# Patient Record
Sex: Male | Born: 1940 | Race: Black or African American | Hispanic: No | Marital: Married | Smoking: Former smoker
Health system: Southern US, Community
[De-identification: ages and names within clinical notes are randomized; demographics above are authoritative.]

## PROBLEM LIST (undated history)

## (undated) DIAGNOSIS — I779 Disorder of arteries and arterioles, unspecified: Secondary | ICD-10-CM

## (undated) DIAGNOSIS — Z860100 Personal history of colon polyps, unspecified: Secondary | ICD-10-CM

## (undated) DIAGNOSIS — I739 Peripheral vascular disease, unspecified: Secondary | ICD-10-CM

## (undated) DIAGNOSIS — Z720 Tobacco use: Secondary | ICD-10-CM

## (undated) DIAGNOSIS — I272 Pulmonary hypertension, unspecified: Secondary | ICD-10-CM

## (undated) DIAGNOSIS — Z8709 Personal history of other diseases of the respiratory system: Secondary | ICD-10-CM

## (undated) DIAGNOSIS — G459 Transient cerebral ischemic attack, unspecified: Secondary | ICD-10-CM

## (undated) DIAGNOSIS — R7881 Bacteremia: Secondary | ICD-10-CM

## (undated) DIAGNOSIS — G35 Multiple sclerosis: Secondary | ICD-10-CM

## (undated) DIAGNOSIS — R972 Elevated prostate specific antigen [PSA]: Secondary | ICD-10-CM

## (undated) DIAGNOSIS — L98499 Non-pressure chronic ulcer of skin of other sites with unspecified severity: Secondary | ICD-10-CM

## (undated) DIAGNOSIS — G4700 Insomnia, unspecified: Secondary | ICD-10-CM

## (undated) DIAGNOSIS — M4150 Other secondary scoliosis, site unspecified: Secondary | ICD-10-CM

## (undated) DIAGNOSIS — I491 Atrial premature depolarization: Secondary | ICD-10-CM

## (undated) DIAGNOSIS — I441 Atrioventricular block, second degree: Secondary | ICD-10-CM

## (undated) DIAGNOSIS — G4730 Sleep apnea, unspecified: Secondary | ICD-10-CM

## (undated) DIAGNOSIS — E785 Hyperlipidemia, unspecified: Secondary | ICD-10-CM

## (undated) DIAGNOSIS — K5900 Constipation, unspecified: Secondary | ICD-10-CM

## (undated) DIAGNOSIS — I10 Essential (primary) hypertension: Secondary | ICD-10-CM

## (undated) DIAGNOSIS — R5383 Other fatigue: Secondary | ICD-10-CM

## (undated) DIAGNOSIS — I70209 Unspecified atherosclerosis of native arteries of extremities, unspecified extremity: Secondary | ICD-10-CM

## (undated) DIAGNOSIS — N529 Male erectile dysfunction, unspecified: Secondary | ICD-10-CM

## (undated) DIAGNOSIS — I255 Ischemic cardiomyopathy: Secondary | ICD-10-CM

## (undated) DIAGNOSIS — R911 Solitary pulmonary nodule: Secondary | ICD-10-CM

## (undated) DIAGNOSIS — R339 Retention of urine, unspecified: Secondary | ICD-10-CM

## (undated) DIAGNOSIS — N390 Urinary tract infection, site not specified: Secondary | ICD-10-CM

## (undated) DIAGNOSIS — I2510 Atherosclerotic heart disease of native coronary artery without angina pectoris: Secondary | ICD-10-CM

## (undated) DIAGNOSIS — R001 Bradycardia, unspecified: Secondary | ICD-10-CM

## (undated) DIAGNOSIS — F0390 Unspecified dementia without behavioral disturbance: Secondary | ICD-10-CM

## (undated) HISTORY — DX: Hyperlipidemia, unspecified: E78.5

## (undated) HISTORY — DX: Solitary pulmonary nodule: R91.1

## (undated) HISTORY — DX: Pulmonary hypertension, unspecified: I27.20

## (undated) HISTORY — DX: Multiple sclerosis: G35

## (undated) HISTORY — DX: Retention of urine, unspecified: R33.9

## (undated) HISTORY — DX: Tobacco use: Z72.0

## (undated) HISTORY — DX: Atrial premature depolarization: I49.1

## (undated) HISTORY — DX: Ischemic cardiomyopathy: I25.5

## (undated) HISTORY — DX: Disorder of arteries and arterioles, unspecified: I77.9

## (undated) HISTORY — DX: Atherosclerotic heart disease of native coronary artery without angina pectoris: I25.10

## (undated) HISTORY — DX: Elevated prostate specific antigen (PSA): R97.20

## (undated) HISTORY — DX: Peripheral vascular disease, unspecified: I73.9

---

## 1988-07-22 HISTORY — PX: HERNIA REPAIR: SHX51

## 1999-05-02 ENCOUNTER — Emergency Department (HOSPITAL_COMMUNITY): Admission: EM | Admit: 1999-05-02 | Discharge: 1999-05-02 | Payer: Self-pay | Admitting: Emergency Medicine

## 1999-05-02 ENCOUNTER — Encounter: Payer: Self-pay | Admitting: Emergency Medicine

## 1999-05-03 ENCOUNTER — Emergency Department (HOSPITAL_COMMUNITY): Admission: EM | Admit: 1999-05-03 | Discharge: 1999-05-03 | Payer: Self-pay | Admitting: Emergency Medicine

## 1999-05-04 ENCOUNTER — Emergency Department (HOSPITAL_COMMUNITY): Admission: EM | Admit: 1999-05-04 | Discharge: 1999-05-04 | Payer: Self-pay | Admitting: Emergency Medicine

## 2003-10-18 ENCOUNTER — Encounter (HOSPITAL_COMMUNITY): Admission: RE | Admit: 2003-10-18 | Discharge: 2003-10-18 | Payer: Self-pay | Admitting: Internal Medicine

## 2003-11-19 ENCOUNTER — Emergency Department (HOSPITAL_COMMUNITY): Admission: EM | Admit: 2003-11-19 | Discharge: 2003-11-19 | Payer: Self-pay | Admitting: Emergency Medicine

## 2003-11-28 ENCOUNTER — Encounter: Admission: RE | Admit: 2003-11-28 | Discharge: 2004-02-26 | Payer: Self-pay | Admitting: Neurology

## 2003-12-23 ENCOUNTER — Encounter: Payer: Self-pay | Admitting: Cardiology

## 2003-12-23 ENCOUNTER — Ambulatory Visit: Admission: RE | Admit: 2003-12-23 | Discharge: 2003-12-23 | Payer: Self-pay | Admitting: Neurology

## 2004-01-11 ENCOUNTER — Encounter (HOSPITAL_COMMUNITY): Admission: RE | Admit: 2004-01-11 | Discharge: 2004-04-10 | Payer: Self-pay | Admitting: Neurology

## 2004-02-21 ENCOUNTER — Emergency Department (HOSPITAL_COMMUNITY): Admission: EM | Admit: 2004-02-21 | Discharge: 2004-02-21 | Payer: Self-pay | Admitting: Emergency Medicine

## 2004-02-27 ENCOUNTER — Encounter: Admission: RE | Admit: 2004-02-27 | Discharge: 2004-03-28 | Payer: Self-pay | Admitting: Neurology

## 2004-03-03 ENCOUNTER — Emergency Department (HOSPITAL_COMMUNITY): Admission: EM | Admit: 2004-03-03 | Discharge: 2004-03-03 | Payer: Self-pay | Admitting: Emergency Medicine

## 2004-03-29 ENCOUNTER — Encounter: Admission: RE | Admit: 2004-03-29 | Discharge: 2004-04-24 | Payer: Self-pay | Admitting: Neurology

## 2004-04-13 ENCOUNTER — Encounter: Payer: Self-pay | Admitting: Cardiology

## 2004-04-13 ENCOUNTER — Ambulatory Visit (HOSPITAL_COMMUNITY): Admission: RE | Admit: 2004-04-13 | Discharge: 2004-04-13 | Payer: Self-pay | Admitting: Neurology

## 2004-06-11 ENCOUNTER — Ambulatory Visit: Payer: Self-pay | Admitting: Cardiology

## 2004-06-28 ENCOUNTER — Encounter: Admission: RE | Admit: 2004-06-28 | Discharge: 2004-07-21 | Payer: Self-pay | Admitting: Neurology

## 2004-07-03 ENCOUNTER — Ambulatory Visit: Payer: Self-pay | Admitting: Internal Medicine

## 2004-07-24 ENCOUNTER — Encounter: Admission: RE | Admit: 2004-07-24 | Discharge: 2004-10-22 | Payer: Self-pay | Admitting: Neurology

## 2004-08-10 ENCOUNTER — Ambulatory Visit: Payer: Self-pay | Admitting: Internal Medicine

## 2004-10-08 ENCOUNTER — Ambulatory Visit: Payer: Self-pay

## 2004-10-18 ENCOUNTER — Encounter (HOSPITAL_COMMUNITY): Admission: RE | Admit: 2004-10-18 | Discharge: 2005-01-16 | Payer: Self-pay | Admitting: Neurology

## 2004-11-07 ENCOUNTER — Emergency Department (HOSPITAL_COMMUNITY): Admission: EM | Admit: 2004-11-07 | Discharge: 2004-11-07 | Payer: Self-pay | Admitting: Emergency Medicine

## 2004-11-09 ENCOUNTER — Ambulatory Visit: Payer: Self-pay | Admitting: Internal Medicine

## 2004-12-04 ENCOUNTER — Encounter: Admission: RE | Admit: 2004-12-04 | Discharge: 2005-01-21 | Payer: Self-pay | Admitting: Neurology

## 2004-12-05 ENCOUNTER — Ambulatory Visit: Payer: Self-pay | Admitting: Internal Medicine

## 2005-01-03 ENCOUNTER — Emergency Department (HOSPITAL_COMMUNITY): Admission: EM | Admit: 2005-01-03 | Discharge: 2005-01-03 | Payer: Self-pay | Admitting: Emergency Medicine

## 2005-01-13 ENCOUNTER — Emergency Department (HOSPITAL_COMMUNITY): Admission: EM | Admit: 2005-01-13 | Discharge: 2005-01-13 | Payer: Self-pay | Admitting: Emergency Medicine

## 2005-01-17 ENCOUNTER — Emergency Department (HOSPITAL_COMMUNITY): Admission: EM | Admit: 2005-01-17 | Discharge: 2005-01-17 | Payer: Self-pay | Admitting: Emergency Medicine

## 2005-03-29 ENCOUNTER — Ambulatory Visit: Payer: Self-pay | Admitting: Internal Medicine

## 2005-07-22 DIAGNOSIS — I2510 Atherosclerotic heart disease of native coronary artery without angina pectoris: Secondary | ICD-10-CM

## 2005-07-22 HISTORY — DX: Atherosclerotic heart disease of native coronary artery without angina pectoris: I25.10

## 2005-12-10 ENCOUNTER — Ambulatory Visit: Payer: Self-pay | Admitting: Internal Medicine

## 2005-12-19 ENCOUNTER — Ambulatory Visit: Payer: Self-pay | Admitting: Gastroenterology

## 2005-12-26 ENCOUNTER — Encounter (INDEPENDENT_AMBULATORY_CARE_PROVIDER_SITE_OTHER): Payer: Self-pay | Admitting: *Deleted

## 2005-12-26 ENCOUNTER — Ambulatory Visit (HOSPITAL_COMMUNITY): Admission: RE | Admit: 2005-12-26 | Discharge: 2005-12-26 | Payer: Self-pay | Admitting: Gastroenterology

## 2005-12-26 ENCOUNTER — Encounter: Payer: Self-pay | Admitting: Internal Medicine

## 2005-12-26 ENCOUNTER — Encounter: Payer: Self-pay | Admitting: Gastroenterology

## 2005-12-31 ENCOUNTER — Ambulatory Visit: Payer: Self-pay | Admitting: Gastroenterology

## 2006-01-08 ENCOUNTER — Ambulatory Visit: Payer: Self-pay | Admitting: Internal Medicine

## 2006-04-30 ENCOUNTER — Ambulatory Visit: Payer: Self-pay | Admitting: Internal Medicine

## 2006-04-30 ENCOUNTER — Ambulatory Visit: Payer: Self-pay | Admitting: Cardiovascular Disease

## 2006-04-30 ENCOUNTER — Inpatient Hospital Stay (HOSPITAL_COMMUNITY): Admission: EM | Admit: 2006-04-30 | Discharge: 2006-05-05 | Payer: Self-pay | Admitting: Emergency Medicine

## 2006-05-01 HISTORY — PX: CORONARY ANGIOPLASTY WITH STENT PLACEMENT: SHX49

## 2006-05-02 ENCOUNTER — Encounter: Payer: Self-pay | Admitting: Cardiology

## 2006-05-12 ENCOUNTER — Ambulatory Visit: Payer: Self-pay | Admitting: Internal Medicine

## 2006-05-12 LAB — CONVERTED CEMR LAB
AST: 19 units/L (ref 0–37)
Alkaline Phosphatase: 57 units/L (ref 39–117)
BUN: 10 mg/dL (ref 6–23)
CO2: 27 meq/L (ref 19–32)
Calcium: 8.4 mg/dL (ref 8.4–10.5)
Chloride: 107 meq/L (ref 96–112)
GFR calc non Af Amer: 80 mL/min
Sodium: 140 meq/L (ref 135–145)

## 2006-05-29 ENCOUNTER — Ambulatory Visit: Payer: Self-pay | Admitting: Cardiology

## 2006-06-05 ENCOUNTER — Ambulatory Visit: Payer: Self-pay | Admitting: Cardiology

## 2006-06-14 ENCOUNTER — Emergency Department (HOSPITAL_COMMUNITY): Admission: EM | Admit: 2006-06-14 | Discharge: 2006-06-14 | Payer: Self-pay | Admitting: Emergency Medicine

## 2006-07-08 ENCOUNTER — Ambulatory Visit: Payer: Self-pay | Admitting: Cardiology

## 2006-07-22 DIAGNOSIS — Z8709 Personal history of other diseases of the respiratory system: Secondary | ICD-10-CM

## 2006-07-22 HISTORY — PX: THORACENTESIS: SHX235

## 2006-07-22 HISTORY — DX: Personal history of other diseases of the respiratory system: Z87.09

## 2006-07-29 ENCOUNTER — Ambulatory Visit: Payer: Self-pay | Admitting: Family Medicine

## 2006-07-30 ENCOUNTER — Emergency Department (HOSPITAL_COMMUNITY): Admission: EM | Admit: 2006-07-30 | Discharge: 2006-07-31 | Payer: Self-pay | Admitting: Emergency Medicine

## 2006-08-05 ENCOUNTER — Ambulatory Visit: Payer: Self-pay | Admitting: Internal Medicine

## 2006-08-05 ENCOUNTER — Inpatient Hospital Stay (HOSPITAL_COMMUNITY): Admission: EM | Admit: 2006-08-05 | Discharge: 2006-08-11 | Payer: Self-pay | Admitting: Emergency Medicine

## 2006-08-15 ENCOUNTER — Ambulatory Visit: Payer: Self-pay | Admitting: Internal Medicine

## 2006-08-17 ENCOUNTER — Inpatient Hospital Stay (HOSPITAL_COMMUNITY): Admission: EM | Admit: 2006-08-17 | Discharge: 2006-09-04 | Payer: Self-pay | Admitting: Emergency Medicine

## 2006-08-17 ENCOUNTER — Encounter: Payer: Self-pay | Admitting: Cardiology

## 2006-08-17 ENCOUNTER — Ambulatory Visit: Payer: Self-pay | Admitting: Pulmonary Disease

## 2006-08-17 ENCOUNTER — Ambulatory Visit: Payer: Self-pay | Admitting: Cardiology

## 2006-08-20 ENCOUNTER — Encounter (INDEPENDENT_AMBULATORY_CARE_PROVIDER_SITE_OTHER): Payer: Self-pay | Admitting: Specialist

## 2006-08-26 ENCOUNTER — Ambulatory Visit: Payer: Self-pay | Admitting: Internal Medicine

## 2006-09-04 ENCOUNTER — Encounter: Payer: Self-pay | Admitting: Internal Medicine

## 2006-09-09 ENCOUNTER — Ambulatory Visit: Payer: Self-pay | Admitting: Internal Medicine

## 2006-09-17 ENCOUNTER — Encounter: Admission: RE | Admit: 2006-09-17 | Discharge: 2006-09-17 | Payer: Self-pay | Admitting: Internal Medicine

## 2006-09-26 ENCOUNTER — Ambulatory Visit: Payer: Self-pay | Admitting: Internal Medicine

## 2006-10-21 ENCOUNTER — Ambulatory Visit: Payer: Self-pay | Admitting: Pulmonary Disease

## 2006-12-08 ENCOUNTER — Ambulatory Visit: Payer: Self-pay | Admitting: Internal Medicine

## 2006-12-18 DIAGNOSIS — G35 Multiple sclerosis: Secondary | ICD-10-CM

## 2006-12-18 DIAGNOSIS — E119 Type 2 diabetes mellitus without complications: Secondary | ICD-10-CM

## 2007-04-06 ENCOUNTER — Telehealth: Payer: Self-pay | Admitting: Internal Medicine

## 2007-04-10 ENCOUNTER — Telehealth: Payer: Self-pay | Admitting: Internal Medicine

## 2007-04-13 ENCOUNTER — Encounter: Payer: Self-pay | Admitting: Internal Medicine

## 2007-04-15 ENCOUNTER — Ambulatory Visit: Payer: Self-pay | Admitting: Internal Medicine

## 2007-04-15 DIAGNOSIS — N400 Enlarged prostate without lower urinary tract symptoms: Secondary | ICD-10-CM | POA: Insufficient documentation

## 2007-04-17 ENCOUNTER — Telehealth (INDEPENDENT_AMBULATORY_CARE_PROVIDER_SITE_OTHER): Payer: Self-pay | Admitting: *Deleted

## 2007-04-20 LAB — CONVERTED CEMR LAB
ALT: 19 units/L (ref 0–53)
AST: 19 units/L (ref 0–37)
BUN: 11 mg/dL (ref 6–23)
Basophils Absolute: 0.1 10*3/uL (ref 0.0–0.1)
Basophils Relative: 1.7 % — ABNORMAL HIGH (ref 0.0–1.0)
CO2: 28 meq/L (ref 19–32)
Calcium: 9.1 mg/dL (ref 8.4–10.5)
Chloride: 110 meq/L (ref 96–112)
Creatinine, Ser: 1 mg/dL (ref 0.4–1.5)
Eosinophils Absolute: 0.2 10*3/uL (ref 0.0–0.6)
Eosinophils Relative: 2 % (ref 0.0–5.0)
GFR calc Af Amer: 96 mL/min
GFR calc non Af Amer: 80 mL/min
Glucose, Bld: 65 mg/dL — ABNORMAL LOW (ref 70–99)
HCT: 40.9 % (ref 39.0–52.0)
Hemoglobin: 13.8 g/dL (ref 13.0–17.0)
Hgb A1c MFr Bld: 14.3 % — ABNORMAL HIGH (ref 4.6–6.0)
Lymphocytes Relative: 31.5 % (ref 12.0–46.0)
MCHC: 33.8 g/dL (ref 30.0–36.0)
MCV: 83 fL (ref 78.0–100.0)
Monocytes Absolute: 0.3 10*3/uL (ref 0.2–0.7)
Monocytes Relative: 3.6 % (ref 3.0–11.0)
Neutro Abs: 4.6 10*3/uL (ref 1.4–7.7)
Neutrophils Relative %: 61.2 % (ref 43.0–77.0)
Platelets: 190 10*3/uL (ref 150–400)
Potassium: 4 meq/L (ref 3.5–5.1)
RBC: 4.93 M/uL (ref 4.22–5.81)
RDW: 14.4 % (ref 11.5–14.6)
Sodium: 143 meq/L (ref 135–145)
WBC: 7.6 10*3/uL (ref 4.5–10.5)

## 2007-04-22 ENCOUNTER — Ambulatory Visit: Payer: Self-pay | Admitting: Internal Medicine

## 2007-04-22 ENCOUNTER — Telehealth: Payer: Self-pay | Admitting: Internal Medicine

## 2007-05-03 ENCOUNTER — Encounter: Payer: Self-pay | Admitting: Internal Medicine

## 2007-05-04 ENCOUNTER — Telehealth (INDEPENDENT_AMBULATORY_CARE_PROVIDER_SITE_OTHER): Payer: Self-pay | Admitting: *Deleted

## 2007-05-18 ENCOUNTER — Encounter: Payer: Self-pay | Admitting: Internal Medicine

## 2007-06-03 ENCOUNTER — Telehealth: Payer: Self-pay | Admitting: Internal Medicine

## 2007-06-29 ENCOUNTER — Telehealth: Payer: Self-pay | Admitting: Internal Medicine

## 2007-07-13 ENCOUNTER — Encounter: Payer: Self-pay | Admitting: Internal Medicine

## 2007-07-30 ENCOUNTER — Telehealth: Payer: Self-pay | Admitting: Internal Medicine

## 2007-07-30 ENCOUNTER — Telehealth (INDEPENDENT_AMBULATORY_CARE_PROVIDER_SITE_OTHER): Payer: Self-pay | Admitting: *Deleted

## 2007-07-31 ENCOUNTER — Ambulatory Visit: Payer: Self-pay | Admitting: Internal Medicine

## 2007-07-31 DIAGNOSIS — E785 Hyperlipidemia, unspecified: Secondary | ICD-10-CM | POA: Insufficient documentation

## 2007-08-04 ENCOUNTER — Telehealth: Payer: Self-pay | Admitting: Internal Medicine

## 2007-08-04 LAB — CONVERTED CEMR LAB: Creatinine,U: 168.3 mg/dL

## 2007-09-02 ENCOUNTER — Telehealth: Payer: Self-pay | Admitting: Internal Medicine

## 2007-09-30 ENCOUNTER — Encounter: Payer: Self-pay | Admitting: Internal Medicine

## 2007-10-01 ENCOUNTER — Encounter: Payer: Self-pay | Admitting: Internal Medicine

## 2007-10-02 ENCOUNTER — Telehealth (INDEPENDENT_AMBULATORY_CARE_PROVIDER_SITE_OTHER): Payer: Self-pay | Admitting: *Deleted

## 2007-10-08 ENCOUNTER — Telehealth: Payer: Self-pay | Admitting: Internal Medicine

## 2007-10-11 ENCOUNTER — Encounter: Payer: Self-pay | Admitting: Internal Medicine

## 2007-11-02 ENCOUNTER — Telehealth (INDEPENDENT_AMBULATORY_CARE_PROVIDER_SITE_OTHER): Payer: Self-pay | Admitting: *Deleted

## 2007-11-04 ENCOUNTER — Ambulatory Visit: Payer: Self-pay | Admitting: Internal Medicine

## 2008-03-17 ENCOUNTER — Ambulatory Visit: Payer: Self-pay | Admitting: Internal Medicine

## 2008-03-17 LAB — HM DIABETES FOOT EXAM

## 2008-03-21 ENCOUNTER — Telehealth (INDEPENDENT_AMBULATORY_CARE_PROVIDER_SITE_OTHER): Payer: Self-pay | Admitting: *Deleted

## 2008-03-21 LAB — CONVERTED CEMR LAB
ALT: 13 units/L (ref 0–53)
AST: 15 units/L (ref 0–37)
BUN: 16 mg/dL (ref 6–23)
Calcium: 9.4 mg/dL (ref 8.4–10.5)
Chloride: 103 meq/L (ref 96–112)
Creatinine, Ser: 0.8 mg/dL (ref 0.4–1.5)
Direct LDL: 123.5 mg/dL
HDL: 33.1 mg/dL — ABNORMAL LOW (ref >39.0)
Hemoglobin: 15.7 g/dL (ref 13.0–17.0)
Potassium: 4.1 meq/L (ref 3.5–5.1)
Triglycerides: 68 mg/dL (ref 0–149)
VLDL: 14 mg/dL (ref 0–40)

## 2008-03-22 ENCOUNTER — Encounter (INDEPENDENT_AMBULATORY_CARE_PROVIDER_SITE_OTHER): Payer: Self-pay | Admitting: *Deleted

## 2008-03-29 ENCOUNTER — Encounter: Payer: Self-pay | Admitting: Internal Medicine

## 2008-04-08 ENCOUNTER — Encounter: Payer: Self-pay | Admitting: Internal Medicine

## 2008-04-22 ENCOUNTER — Telehealth (INDEPENDENT_AMBULATORY_CARE_PROVIDER_SITE_OTHER): Payer: Self-pay | Admitting: *Deleted

## 2008-05-04 ENCOUNTER — Encounter: Payer: Self-pay | Admitting: Internal Medicine

## 2008-07-28 ENCOUNTER — Encounter: Payer: Self-pay | Admitting: Internal Medicine

## 2008-08-30 ENCOUNTER — Encounter: Admission: RE | Admit: 2008-08-30 | Discharge: 2008-11-28 | Payer: Self-pay | Admitting: Neurology

## 2008-09-28 ENCOUNTER — Encounter: Payer: Self-pay | Admitting: Internal Medicine

## 2008-12-21 ENCOUNTER — Encounter (INDEPENDENT_AMBULATORY_CARE_PROVIDER_SITE_OTHER): Payer: Self-pay | Admitting: *Deleted

## 2009-01-19 ENCOUNTER — Encounter: Payer: Self-pay | Admitting: Internal Medicine

## 2009-01-20 ENCOUNTER — Telehealth (INDEPENDENT_AMBULATORY_CARE_PROVIDER_SITE_OTHER): Payer: Self-pay | Admitting: *Deleted

## 2009-02-20 ENCOUNTER — Ambulatory Visit: Payer: Self-pay | Admitting: Internal Medicine

## 2009-02-20 LAB — CONVERTED CEMR LAB
Basophils Relative: 0.2 % (ref 0.0–3.0)
Hemoglobin: 15.2 g/dL (ref 13.0–17.0)
MCHC: 33 g/dL (ref 30.0–36.0)
Monocytes Relative: 6.5 % (ref 3.0–12.0)
Platelets: 176 10*3/uL (ref 150.0–400.0)
WBC: 7 10*3/uL (ref 4.5–10.5)

## 2009-02-27 ENCOUNTER — Encounter (INDEPENDENT_AMBULATORY_CARE_PROVIDER_SITE_OTHER): Payer: Self-pay | Admitting: *Deleted

## 2009-02-27 LAB — CONVERTED CEMR LAB
Chloride: 109 meq/L (ref 96–112)
Sodium: 143 meq/L (ref 135–145)

## 2009-03-17 ENCOUNTER — Encounter: Payer: Self-pay | Admitting: Internal Medicine

## 2009-04-10 ENCOUNTER — Encounter: Payer: Self-pay | Admitting: Internal Medicine

## 2009-04-27 ENCOUNTER — Telehealth (INDEPENDENT_AMBULATORY_CARE_PROVIDER_SITE_OTHER): Payer: Self-pay | Admitting: *Deleted

## 2009-05-02 ENCOUNTER — Ambulatory Visit: Payer: Self-pay | Admitting: Internal Medicine

## 2009-05-05 LAB — CONVERTED CEMR LAB
Cholesterol: 176 mg/dL (ref 0–200)
HDL: 30.7 mg/dL — ABNORMAL LOW (ref >39.00)
Triglycerides: 53 mg/dL (ref 0.0–149.0)
VLDL: 10.6 mg/dL (ref 0.0–40.0)

## 2009-07-05 ENCOUNTER — Encounter (INDEPENDENT_AMBULATORY_CARE_PROVIDER_SITE_OTHER): Payer: Self-pay | Admitting: *Deleted

## 2009-07-24 ENCOUNTER — Telehealth: Payer: Self-pay | Admitting: Internal Medicine

## 2009-07-24 ENCOUNTER — Emergency Department (HOSPITAL_COMMUNITY): Admission: EM | Admit: 2009-07-24 | Discharge: 2009-07-24 | Payer: Self-pay | Admitting: Emergency Medicine

## 2009-07-25 ENCOUNTER — Telehealth (INDEPENDENT_AMBULATORY_CARE_PROVIDER_SITE_OTHER): Payer: Self-pay | Admitting: *Deleted

## 2009-08-08 ENCOUNTER — Ambulatory Visit: Payer: Self-pay | Admitting: Internal Medicine

## 2009-08-10 LAB — CONVERTED CEMR LAB
ALT: 25 units/L (ref 0–53)
HDL: 34.1 mg/dL — ABNORMAL LOW (ref >39.00)
Total CHOL/HDL Ratio: 4
VLDL: 10.2 mg/dL (ref 0.0–40.0)

## 2009-08-25 ENCOUNTER — Encounter (INDEPENDENT_AMBULATORY_CARE_PROVIDER_SITE_OTHER): Payer: Self-pay | Admitting: *Deleted

## 2009-09-19 ENCOUNTER — Ambulatory Visit: Payer: Self-pay | Admitting: Internal Medicine

## 2009-09-21 LAB — CONVERTED CEMR LAB
BUN: 10 mg/dL (ref 6–23)
Chloride: 102 meq/L (ref 96–112)
Creatinine, Ser: 0.8 mg/dL (ref 0.4–1.5)
GFR calc non Af Amer: 123.8 mL/min (ref >60)
TSH: 1.23 microintl units/mL (ref 0.35–5.50)

## 2009-10-13 ENCOUNTER — Ambulatory Visit: Payer: Self-pay | Admitting: Gastroenterology

## 2009-11-06 ENCOUNTER — Telehealth (INDEPENDENT_AMBULATORY_CARE_PROVIDER_SITE_OTHER): Payer: Self-pay | Admitting: *Deleted

## 2009-11-07 ENCOUNTER — Observation Stay (HOSPITAL_COMMUNITY): Admission: AD | Admit: 2009-11-07 | Discharge: 2009-11-08 | Payer: Self-pay | Admitting: Gastroenterology

## 2009-11-07 ENCOUNTER — Ambulatory Visit: Payer: Self-pay | Admitting: Gastroenterology

## 2009-11-08 ENCOUNTER — Encounter: Payer: Self-pay | Admitting: Gastroenterology

## 2009-11-14 ENCOUNTER — Encounter: Payer: Self-pay | Admitting: Gastroenterology

## 2010-01-08 ENCOUNTER — Encounter: Payer: Self-pay | Admitting: Internal Medicine

## 2010-04-04 ENCOUNTER — Ambulatory Visit: Payer: Self-pay | Admitting: Internal Medicine

## 2010-05-15 ENCOUNTER — Emergency Department (HOSPITAL_COMMUNITY): Admission: EM | Admit: 2010-05-15 | Discharge: 2010-05-15 | Payer: Self-pay | Admitting: Emergency Medicine

## 2010-05-15 ENCOUNTER — Encounter: Payer: Self-pay | Admitting: Internal Medicine

## 2010-05-15 ENCOUNTER — Telehealth (INDEPENDENT_AMBULATORY_CARE_PROVIDER_SITE_OTHER): Payer: Self-pay | Admitting: *Deleted

## 2010-05-18 ENCOUNTER — Ambulatory Visit: Payer: Self-pay | Admitting: Internal Medicine

## 2010-05-18 DIAGNOSIS — R55 Syncope and collapse: Secondary | ICD-10-CM

## 2010-06-06 ENCOUNTER — Encounter: Payer: Self-pay | Admitting: Cardiology

## 2010-06-07 ENCOUNTER — Ambulatory Visit: Payer: Self-pay | Admitting: Cardiology

## 2010-06-07 DIAGNOSIS — F17200 Nicotine dependence, unspecified, uncomplicated: Secondary | ICD-10-CM

## 2010-06-29 ENCOUNTER — Ambulatory Visit: Payer: Self-pay

## 2010-07-26 ENCOUNTER — Encounter: Payer: Self-pay | Admitting: Cardiology

## 2010-08-12 ENCOUNTER — Encounter: Payer: Self-pay | Admitting: Internal Medicine

## 2010-08-20 ENCOUNTER — Emergency Department (HOSPITAL_COMMUNITY)
Admission: EM | Admit: 2010-08-20 | Discharge: 2010-08-20 | Payer: Self-pay | Source: Home / Self Care | Admitting: Family Medicine

## 2010-08-21 ENCOUNTER — Telehealth: Payer: Self-pay | Admitting: Internal Medicine

## 2010-08-21 NOTE — Procedures (Signed)
Summary: Colonoscopy/[facility]  Colonoscopy/[facility]   Imported By: Lanelle Bal 09/25/2009 13:38:23  _____________________________________________________________________  External Attachment:    Type:   Image     Comment:   External Document

## 2010-08-21 NOTE — Progress Notes (Signed)
Summary: 10-27-FYI--pt going to the hospital  Phone Note Call from Patient Call back at Home Phone [phone]   Summary of Call: Patient spouse called to notify the office that she called EMS for the patient due to the patient being lethargic, "became paralyzed" and moaning. She also states that EMS said the patient BP is too low. Patient will be taken to [facility]. Initial call taken by: Lucious Groves CMA,  May 15, 2010 2:43 PM  Follow-up for Phone Call        please check on him in 2 days Follow-up by: [facility] E. [name] MD,  May 15, 2010 4:07 PM  Additional Follow-up for Phone Call Additional follow up Details #1::        left message to call office...............Marland KitchenFelecia Deloach CMA  May 17, 2010 8:37 AM

## 2010-08-21 NOTE — Progress Notes (Signed)
  Phone Note Call from Patient   Caller: Spouse Summary of Call: pt wife called does pt still need followup appt for cholesterol was at ED 04/23/10.  informed pt still needs labwork and lab scheduled  Initial call taken by: Kandice Hams,  July 25, 2009 2:29 PM

## 2010-08-21 NOTE — Assessment & Plan Note (Signed)
Summary: hosp followup--trigger m/s attack from uti///sph   Vital Signs:  Patient profile:   70 year old male Height:      71 inches Pulse rate:   74 / minute Pulse rhythm:   regular BP sitting:   104 / 76  (left arm) Cuff size:   regular  Vitals Entered By: Army Fossa CMA (May 18, 2010 1:09 PM) CC: Hospital f/u- not fasting Comments BP was low Had a UTI- on ciprofloxacin now. 5 days left  [facility]    History of Present Illness: the patient presented to the hospital ER 05-15-10 with a near-syncope, chart is reviewed:  cardiac enzymes x1 negative Urinalysis showed few bacteria  CBC show white count of 5.8, immobile and 13.7, platelets 141 it's my creatinine 1.2, potassium 3.6  he was diagnosed with near syncope in the setting of a UTI ( eventually the urine culture came back negative), his blood pressure was found initially to be low in the 90s. His CBG was 199  at the time, he had mental status changes, he couldn't speak for about an hour, was unresponsive for a while but apparently the patient was able to hear his wife talking to him. There were no associated symptoms like chest pain, bladder or bowel incontinence or seizures. The urine showed few WBCs, he was prescribed Cipro. Eventually the urine culture came back negative. Since that time he is feeling fine and no further problems.  ROS No nausea, vomiting, blood in the stools No fever or dysuria Urine seems normal in color No further mental status changes No chest pain or shortness of breath      Current Medications (verified): 1)  Amantadine Hcl 100 Mg Tabs (Amantadine Hcl) .... Take 1 Tablet By Mouth Three Times A Day 2)  Lantus 100 Unit/ml  Soln (Insulin Glargine) .... 40 Units At Bedtime 3)  Aspirin Ec 325 Mg Tbec (Aspirin) .Marland Kitchen.. 1 A Day 4)  Simvastatin 40 Mg Tabs (Simvastatin) .Marland Kitchen.. 1 By Mouth At Bedtime 5)  Humalog Pen 100 Unit/ml Soln (Insulin Lispro (Human)) .... 8 Units Per Day After Each  Meal 6)  Finasteride 5 Mg Tabs (Finasteride) .... Take One By Mouth Once Daily  Allergies (verified): No Known Drug Allergies  Past History:  Past Medical History: Reviewed history from 09/19/2009 and no changes required. 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 S. increased PSA Hyperlipidemia Lung nodule-- resolved per 11-2006 CT chest   Past Surgical History: Reviewed history from 07/31/2007 and no changes required. hernia repair  (R)  Social History: Reviewed history from 09/19/2009 and no changes required. Married children x 2  Current Smoker, has cut down "1 pack a month" Alcohol use-no    Physical Exam  General:  alert and well-developed.   Lungs:  normal respiratory effort, no intercostal retractions, no accessory muscle use, and normal breath sounds.   Heart:  normal rate, regular rhythm, and no murmur.   Extremities:  no edema   Impression & Recommendations:  Problem # 1:  SYNCOPE (ICD-780.2)  syncope as described above--->  the patient had mental status changes, low BP,  bacteriuria with a  negative urine culture. Etiology unclear; apparently there was not  hypoglycemia. He did have hypotension  but no evidence of sepsis  His EKG from the Er is abnormal w/a wide QRS no old EKGs, unable to get  him in the table for a repeated EKG He does have a h/o CAD Plan: finish antibiotics cards evaluation encouraged ASAdaily    Orders: Cardiology Referral (Cardiology)  Complete Medication List: 1)  Amantadine Hcl 100 Mg Tabs (Amantadine hcl) .... Take 1 tablet by mouth three times a day 2)  Lantus 100 Unit/ml Soln (Insulin glargine) .... 40 units at bedtime 3)  Aspirin Ec 325 Mg Tbec (Aspirin) .Marland Kitchen.. 1 a day 4)  Simvastatin 40 Mg Tabs (Simvastatin) .Marland Kitchen.. 1 by mouth at bedtime 5)  Humalog Pen 100 Unit/ml Soln (Insulin  lispro (human)) .... 8 units per day after each meal 6)  Finasteride 5 Mg Tabs (Finasteride) .... Take one by mouth once daily  Patient Instructions: 1)  finish your antibiotics 2)  Call me any time if your blood pressures are less than 110/70, or if you have fever, difficulty urinating, nausea, vomiting or stomach pain   Orders Added: 1)  Cardiology Referral [Cardiology] 2)  Est. Patient Level IV [78295]

## 2010-08-21 NOTE — Letter (Signed)
Summary: DM--improving, [facility]   Imported By: Lanelle Bal 01/24/2010 14:24:22  _____________________________________________________________________  External Attachment:    Type:   Image     Comment:   External Document

## 2010-08-21 NOTE — Assessment & Plan Note (Signed)
  Review of gastrointestinal problems: 1. Adenomatous colon polyps removed 2007 by Dr. Christella Hartigan, recall colonoscopy at 3 year interval    History of Present Illness Visit Type: Initial Visit Primary GI MD: Rob Bunting MD Primary Provider: Willow Ora, MD Chief Complaint: Discuss Colonoscopy History of Present Illness:     very pleasant 70 year old man who has been in wheelchair for 10-15 years for MS complications.  I performed a colonoscopy on him while he was hospitalized in 2007, see those results above.  He is on no blood thinners.  No major medical issues since last colonoscopy.    he has one bowel movement every day or 2 at home, this is usually quite difficult for he and his wife to deal with, often has incontinence around this time do to his MS, not being able to get to the bathroom in time since he is wheelchair-bound.           Current Medications (verified): 1)  Amantadine Hcl 100 Mg Tabs (Amantadine Hcl) .... Take 1 Tablet By Mouth Three Times A Day 2)  Lantus 100 Unit/ml  Soln (Insulin Glargine) .... 40 Units At Bedtime 3)  Aspirin Ec 325 Mg Tbec (Aspirin) .Marland Kitchen.. 1 A Day 4)  Simvastatin 40 Mg Tabs (Simvastatin) .Marland Kitchen.. 1 By Mouth At Bedtime 5)  Humalog Pen 100 Unit/ml Soln (Insulin Lispro (Human)) .... 8 Units Per Day After Each Meal 6)  Finasteride 5 Mg Tabs (Finasteride) .... Take One By Mouth Once Daily  Allergies (verified): No Known Drug Allergies  Vital Signs:  Patient profile:   70 year old male Height:      71 inches Pulse rate:   72 / minute Pulse rhythm:   regular BP sitting:   100 / 60  (left arm) Cuff size:   regular  Vitals Entered By: Harlow Mares CMA Duncan Dull) (October 13, 2009 11:16 AM)  Physical Exam  Additional Exam:  Constitutional: generally well appearing man, sitting in a wheelchair Psychiatric: alert and oriented times 3 Abdomen: soft, non-tender, non-distended, normal bowel sounds    Impression & Recommendations:  Problem # 1:  history  of precancerous colon polyps he is due for surveillance colonoscopy around now and we will arrange that to be done at his soonest convenience. He is wheelchair-bound and even has difficulty with his usual everyday or 2 BMs, causing quite a mess at home, being difficult to deal with. I think it would be extremely difficult for him to prep at home and so we will admit him to observation status the day prior to his colonoscopy prep him while in the hospital and then perform his colonoscopy the next morning. If all goes well then he will be able to be discharged shortly after his procedure.  Weight was not documented today as patient unable to stand; BMI could not be calculated.  Patient Instructions: 1)  You will be scheduled to have a colonoscopy at [facility] during Dr. Christella Hartigan next hospital week. 2)  You will be admitted the day prior (observation status) and then undergo colonoscopy the next morning with Dr. Christella Hartigan. 3)  Make sure to drink only clear liquids starting the morning before your colonoscopy. 4)  The medication list was reviewed and reconciled.  All changed / newly prescribed medications were explained.  A complete medication list was provided to the patient / caregiver.

## 2010-08-21 NOTE — Assessment & Plan Note (Signed)
Summary: yearly check/cbs   Vital Signs:  Patient profile:   70 year old male Pulse rate:   76 / minute BP sitting:   120 / 80  Vitals Entered By: Shary Decamp (September 19, 2009 1:20 PM) CC: yearly   History of Present Illness: CAD-- does not see cards routinely   DM-- still sees Dr Talmage Nap regularly  M. S.-- symptoms stable x 3 years , to see neuro soon  Hyperlipidemia-- good medication compliance    yearly checkup, chart reviewed  Current Medications (verified): 1)  Amantadine Hcl 100 Mg Tabs (Amantadine Hcl) .... Take 1 Tablet By Mouth Three Times A Day 2)  Lantus 100 Unit/ml  Soln (Insulin Glargine) .... 28 Units Daily 3)  Aspirin Ec 325 Mg Tbec (Aspirin) .Marland Kitchen.. 1 A Day 4)  Novolog 100 Unit/ml Soln (Insulin Aspart) .... 3 Units Three Times A Day 5)  Simvastatin 40 Mg Tabs (Simvastatin) .Marland Kitchen.. 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 S. increased PSA Hyperlipidemia Lung nodule-- resolved per 11-2006 CT chest   Past Surgical History: Reviewed history from 07/31/2007 and no changes required. hernia repair  (R)  Family History: Hypertension-- + M: MI 83y/0 colon ca: no Prostate ca: no  Social History: Married children x 2  Current Smoker, has cut down "1 pack a month" Alcohol use-no    Review of Systems General:  Denies fever. CV:  Denies chest pain or discomfort and swelling of feet. Resp:  Denies cough and shortness of breath. GI:  Denies bloody stools, diarrhea, nausea, and vomiting. GU:  Denies urinary frequency and urinary hesitancy. Psych:  Denies anxiety and depression.  Physical Exam  General:  alert.  wheelchair bound  Lungs:  normal respiratory effort, no intercostal retractions, no accessory  muscle use, and normal breath sounds.   Heart:  normal rate, regular rhythm, and no murmur.   Abdomen:  soft, non-tender, normal bowel sounds, no distention, and no masses.   Extremities:  no pretibial edema bilaterally  Psych:  Oriented X3, normally interactive, good eye contact, not anxious appearing, and not depressed appearing.     Impression & Recommendations:  Problem # 1:  HYPERLIPIDEMIA (ICD-272.4) doing well  His updated medication list for this problem includes:    Simvastatin 40 Mg Tabs (Simvastatin) .Marland Kitchen... 1 by mouth at bedtime  Orders: TLB-BMP (Basic Metabolic Panel-BMET) (80048-METABOL) Venipuncture (60454) TLB-TSH (Thyroid Stimulating Hormone) (84443-TSH)  Labs Reviewed: SGOT: 26 (08/08/2009)   SGPT: 25 (08/08/2009)   HDL:34.10 (08/08/2009), 30.70 (05/02/2009)  LDL:96 (08/08/2009), 135 (09/81/1914)  Chol:140 (08/08/2009), 176 (05/02/2009)  Trig:51.0 (08/08/2009), 53.0 (05/02/2009)  Problem # 2:  HEALTH SCREENING (ICD-V70.0) Td-- declined  pneumonia shot-- per patient 2007   last PSA 8-10 = 3.22  last colonoscopy 6-07, recall letter  was issues 12/2008 ----> plans to see GI soon (per wife)  Problem # 3:  DM (ICD-250.00) per Dr Talmage Nap  The following medications were removed from the medication list:    Novolog 100 Unit/ml Soln (Insulin aspart) .Marland KitchenMarland KitchenMarland KitchenMarland Kitchen 3 units three times a day His updated medication list for this problem includes:    Lantus 100 Unit/ml Soln (Insulin glargine) .Marland Kitchen... 28 units daily    Aspirin Ec 325 Mg Tbec (Aspirin) .Marland Kitchen... 1 a day  Problem #  4:  MULTIPLE SCLEROSIS (ICD-340) stable   Problem # 5:  CORONARY ARTERY DISEASE (ICD-414.00) asx continue w/ ASA, chol and DM control   His updated medication list for this problem includes:    Aspirin Ec 325 Mg Tbec (Aspirin) .Marland Kitchen... 1 a day  Complete Medication List: 1)  Amantadine Hcl 100 Mg Tabs (Amantadine hcl) .... Take 1 tablet by mouth three times a day 2)  Lantus 100 Unit/ml Soln (Insulin glargine)  .... 28 units daily 3)  Aspirin Ec 325 Mg Tbec (Aspirin) .Marland Kitchen.. 1 a day 4)  Simvastatin 40 Mg Tabs (Simvastatin) .Marland Kitchen.. 1 by mouth at bedtime  Patient Instructions: 1)  Please schedule a follow-up appointment in 6 months .    Risk Factors: Tobacco use:  current Alcohol use:  no    Pneumovax Immunization History:    Pneumovax # 1:  historical, done at hospital (07/22/2006)

## 2010-08-21 NOTE — Assessment & Plan Note (Signed)
Summary: rto 6 months/cbs/rescd cbs   Vital Signs:  Patient profile:   70 year old male Height:      71 inches Pulse rate:   94 / minute Pulse rhythm:   regular BP sitting:   110 / 80  (left arm) Cuff size:   regular  Vitals Entered By: Army Fossa CMA (April 04, 2010 2:37 PM) CC: 6 month f/u- not fasting Comments declines flu shot pharm- [facility]   History of Present Illness: ROV here w/ his wife , reports no concerns    Current Medications (verified): 1)  Amantadine Hcl 100 Mg Tabs (Amantadine Hcl) .... Take 1 Tablet By Mouth Three Times A Day 2)  Lantus 100 Unit/ml  Soln (Insulin Glargine) .... 40 Units At Bedtime 3)  Aspirin Ec 325 Mg Tbec (Aspirin) .Marland Kitchen.. 1 A Day 4)  Simvastatin 40 Mg Tabs (Simvastatin) .Marland Kitchen.. 1 By Mouth At Bedtime 5)  Humalog Pen 100 Unit/ml Soln (Insulin Lispro (Human)) .... 8 Units Per Day After Each Meal 6)  Finasteride 5 Mg Tabs (Finasteride) .... Take One By Mouth Once Daily  Allergies (verified): No Known Drug Allergies  Past History:  Past Medical History: Reviewed history from 09/19/2009 and no changes required. 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 S. increased PSA Hyperlipidemia Lung nodule-- resolved per 11-2006 CT chest   Past Surgical History: Reviewed history from 07/31/2007 and no changes required. hernia repair  (R)  Review of Systems       CAD-- denies CP-SOB DM II-- sees Dr Talmage Nap , last  note from her states DM is improving  increased PSA-- no hematuria, no diff. urinating  Hyperlipidemia-- good medication compliance     Physical Exam  General:  alert.  wheelchair bound  Lungs:  normal respiratory effort, no intercostal retractions, no accessory muscle use, and normal breath sounds.   Heart:  normal rate, regular rhythm, and no murmur.   Extremities:  no  pretibial edema bilaterally  Psych:  Oriented X3, memory intact for recent and remote, normally interactive, good eye contact, not anxious appearing, and not depressed appearing.  good spirits   Impression & Recommendations:  Problem # 1:  HYPERLIPIDEMIA (ICD-272.4)  well controlled per last [facility] His updated medication list for this problem includes:    Simvastatin 40 Mg Tabs (Simvastatin) .Marland Kitchen... 1 by mouth at bedtime  Labs Reviewed: SGOT: 26 (08/08/2009)   SGPT: 25 (08/08/2009)   HDL:34.10 (08/08/2009), 30.70 (05/02/2009)  LDL:96 (08/08/2009), 135 (16/04/9603)  Chol:140 (08/08/2009), 176 (05/02/2009)  Trig:51.0 (08/08/2009), 53.0 (05/02/2009)  Orders: Venipuncture (54098) TLB-ALT (SGPT) (84460-ALT) TLB-AST (SGOT) (84450-SGOT) Specimen Handling (11914)  Problem # 2:  DM (ICD-250.00) per endocrinology His updated medication list for this problem includes:    Lantus 100 Unit/ml Soln (Insulin glargine) .Marland KitchenMarland KitchenMarland KitchenMarland Kitchen 40 units at bedtime    Aspirin Ec 325 Mg Tbec (Aspirin) .Marland Kitchen... 1 a day    Humalog Pen 100 Unit/ml Soln (Insulin lispro (human)) .Marland Kitchen... 8 units per day after each meal  Problem # 3:  PROSTATE SPECIFIC ANTIGEN, ELEVATED (ICD-790.93)  recheck  Orders: TLB-PSA (Prostate Specific Antigen) (84153-PSA) Specimen Handling (78295)  Problem # 4:  explained the benefits of flu shot states he had a reaction to it in the past  Complete Medication List: 1)  Amantadine Hcl 100 Mg Tabs (Amantadine hcl) .... Take 1 tablet by mouth three times  a day 2)  Lantus 100 Unit/ml Soln (Insulin glargine) .... 40 units at bedtime 3)  Aspirin Ec 325 Mg Tbec (Aspirin) .Marland Kitchen.. 1 a day 4)  Simvastatin 40 Mg Tabs (Simvastatin) .Marland Kitchen.. 1 by mouth at bedtime 5)  Humalog Pen 100 Unit/ml Soln (Insulin lispro (human)) .... 8 units per day after each meal 6)  Finasteride 5 Mg Tabs (Finasteride) .... Take one by mouth once daily  Patient Instructions: 1)  Please schedule a follow-up appointment in 6 months ,  fasting physical.     Influenza Immunization History:    Influenza # 1:  declined, had a reaction, told not to take again (04/04/2010)

## 2010-08-21 NOTE — Letter (Signed)
Summary: New Patient letter  [facility]  [address] [place], [place] [address]   Phone: [phone]  Fax: [phone]       08/25/2009 MRN: [id number]  Jaime [name] [address] [place], [place]  [address]  Dear [name],  Welcome to the Gastroenterology Division at [facility].    You are scheduled to see Dr.  Christella Hartigan on 10/13/2009 at 11:00am on the 3rd floor at [facility], [address]  We ask that you try to arrive at our office 15 minutes prior to your appointment time to allow for check-in.  We would like you to complete the enclosed self-administered evaluation form prior to your visit and bring it with you on the day of your appointment.  We will review it with you.  Also, please bring a complete list of all your medications or, if you prefer, bring the medication bottles and we will list them.  Please bring your insurance card so that we may make a copy of it.  If your insurance requires a referral to see a specialist, please bring your referral form from your primary care physician.  Co-payments are due at the time of your visit and may be paid by cash, check or credit card.     Your office visit will consist of a consult with your physician (includes a physical exam), any laboratory testing he/she may order, scheduling of any necessary diagnostic testing (e.g. x-ray, ultrasound, CT-scan), and scheduling of a procedure (e.g. Endoscopy, Colonoscopy) if required.  Please allow enough time on your schedule to allow for any/all of these possibilities.    If you cannot keep your appointment, please call [phone] to cancel or reschedule prior to your appointment date.  This allows [place] the opportunity to schedule an appointment for another patient in need of care.  If you do not cancel or reschedule by 5 p.m. the business day prior to your appointment date, you will be charged a $50.00 late cancellation/no-show fee.    Thank you for  choosing [facility] for your medical needs.  We appreciate the opportunity to care for you.  Please visit [place] at our website  to learn more about our practice.                     Sincerely,                                                             The Gastroenterology Division

## 2010-08-21 NOTE — Progress Notes (Signed)
Summary: hosp admit  ---- Converted from flag ---- ---- 11/06/2009 10:40 AM, Chales Abrahams CMA (AAMA) wrote: Bed placement called and they will contact the pt tomorrow with a bed.  The pt was called and is aware.  ---- 11/06/2009 10:17 AM, Rachael Fee MD wrote: ok, please let [facility] know about this admission for tomorrow, colonoscopy on Wed  ---- 10/13/2009 11:36 AM, Chales Abrahams CMA (AAMA) wrote: call [facility] and have pt admitted the day before colon on Wed the 20th. ------------------------------

## 2010-08-21 NOTE — Miscellaneous (Signed)
  Clinical Lists Changes  Observations: Added new observation of PAST MED HX: HYPERLIPIDEMIA CARDIOMYOPATHY, ISCHEMIC EF 45%..... echo.. January, 2008... severe hypokinesis inferior and posterior walls at base and mid ventricle Pulmonary hypertension    moderate.... echo.... January, 2008 CAD   non-STEMI.. 2007.Marland Kitchen occluded circumflex... Taxus stent placed.... residual 80% LAD.Marland Kitchen 50% RCA CHF  UNSPECIFIED SYSTOLIC HEART FAILURE CELLULITIS SYNCOPE  HEALTH SCREENING  PROSTATE SPECIFIC ANTIGEN, ELEVATED  FAMILY HISTORY OF CAD MALE 1ST DEGREE RELATIVE <50  DM  MULTIPLE SCLEROSIS increased PSA Lung nodule-- resolved per 11-2006 CT chest  Enterococcus urinary tract infection per urine culture January 19,2008. Syncope     near syncope... ER visit... May 15, 2010 (06/06/2010 14:37) Added new observation of PRIMARY MD: Willow Ora, MD (06/06/2010 14:37)       Past History:  Past Medical History: HYPERLIPIDEMIA CARDIOMYOPATHY, ISCHEMIC EF 45%..... echo.. January, 2008... severe hypokinesis inferior and posterior walls at base and mid ventricle Pulmonary hypertension    moderate.... echo.... January, 2008 CAD   non-STEMI.. 2007.Marland Kitchen occluded circumflex... Taxus stent placed.... residual 80% LAD.Marland Kitchen 50% RCA CHF  UNSPECIFIED SYSTOLIC HEART FAILURE CELLULITIS SYNCOPE  HEALTH SCREENING  PROSTATE SPECIFIC ANTIGEN, ELEVATED  FAMILY HISTORY OF CAD MALE 1ST DEGREE RELATIVE <50  DM  MULTIPLE SCLEROSIS increased PSA Lung nodule-- resolved per 11-2006 CT chest  Enterococcus urinary tract infection per urine culture January 19,2008. Syncope     near syncope... ER visit... May 15, 2010

## 2010-08-21 NOTE — Procedures (Signed)
Summary: Colon   Colonoscopy  Procedure date:  12/26/2005  Findings:      Location:  [facility].   Patient Name: Jaime [name], Jaime [name] MRN: [id number] Procedure Procedures: Colonoscopy CPT: 54098.    with polypectomy. CPT: A3573898.  Personnel: Endoscopist: Rachael Fee, MD.  Exam Location: Exam performed in Endoscopy Suite. Outpatient  Patient Consent: Procedure, Alternatives, Risks and Benefits discussed, consent obtained, from patient. Consent was obtained by the RN.  Indications  Surveillance of: Adenomatous Polyp(s).  Comments: colonoscopy with polypectomy in [place] 5-6 years ago. History  Current Medications: Patient is not currently taking Coumadin.  Pre-Exam Physical: Performed Dec 26, 2005. Cardio-pulmonary exam, Abdominal exam WNL. Neurological exam abnormal. Mental status exam WNL. Abnormal PE findings include: weak from MS.  Comments: Pt. history reviewed/updated, physical exam performed prior to initiation of sedation? yes Exam Exam: Extent of exam reached: Cecum, extent intended: Cecum.  The cecum was identified by appendiceal orifice and IC valve. Patient position: on left side. Colon retroflexion performed. Images taken. ASA Classification: II. Tolerance: good.  Monitoring: Pulse and BP monitoring, Oximetry used. Supplemental O2 given.  Colon Prep Prep results: good.  Fluoroscopy: Fluoroscopy was not used.  Sedation Meds: Patient assessed and found to be appropriate for moderate (conscious) sedation. Fentanyl 50 mcg. given IV. Versed 5 mg. given IV.  Findings POLYP: Transverse Colon, Maximum size: 1 mm. sessile polyp. Procedure:  snare without cautery, removed, not retrieved, ICD9: Colon Polyps: 211.3.  POLYP: Transverse Colon, Maximum size: 2 mm. sessile polyp. Procedure:  snare without cautery, removed, retrieved, Polyp sent to pathology. ICD9: Colon Polyps: 211.3.  POLYP: Ascending Colon, Maximum size: 2 mm. sessile polyp. Procedure:  snare without cautery, removed, retrieved, sent to pathology. ICD9: Colon Polyps: 211.3.  POLYP: Ascending Colon, Maximum size: 2 mm. sessile polyp. Procedure:  snare without cautery, removed, retrieved, sent to pathology. ICD9: Colon Polyps: 211.3.  POLYP: Descending Colon, Maximum size: 3 mm. sessile polyp. Procedure:  snare without cautery, removed, retrieved, sent to pathology. ICD9: Colon Polyps: 211.3.  POLYP: Descending Colon, Maximum size: 2 mm. sessile polyp. Procedure:  snare without cautery, removed, not retrieved, ICD9: Colon Polyps: 211.3.  POLYP: Sigmoid Colon, Maximum size: 1 mm. sessile polyp. Procedure:  snare without cautery, removed, not retrieved, ICD9: Colon Polyps: 211.3.  POLYP: Sigmoid Colon, Maximum size: 2 mm. sessile polyp. Procedure:  snare without cautery, removed, retrieved, sent to pathology. ICD9: Colon Polyps: 211.3.  - NORMAL EXAM: Cecum to Rectum. Comments: otherwise normal examination.  - HEMORRHOIDS: External. Size: Small. ICD9: Hemorrhoids, External: 455.3.   Assessment Abnormal examination, see findings above.  Diagnoses: 211.3: Colon Polyps.  455.3: Hemorrhoids, External.   Comments: Several small colon polyps, no cancers. Events  Unplanned Interventions: No intervention was required.  Unplanned Events: There were no complications. Plans Comments: Await pathology, but he will likely need surveillance colonoscopy in 3 years given multiple small polyps removed on this exam and his personal history of polyps. Disposition: After procedure patient sent to recovery.  Scheduling/Referral: Await pathology to schedule patient.  This report was created from the original endoscopy report, which was reviewed and signed by the above listed endoscopist.

## 2010-08-21 NOTE — Letter (Signed)
Summary: Results Letter  [facility]  [address] [place], [place] [address]   Phone: [phone]  Fax: [phone]        November 14, 2009 MRN: [id number]    Jaime [name] [address] [place], [place]  [address]    Dear Mr. Nowaczyk,   Good news.  The polyp that was removed during your recent procedure was NOT pre-cancerous.  Given your personal history of precancerous polyps you will need a repeat colonoscopy in 5 years.  We will put your information in our reminder system and contact you around that time.        Sincerely,  Rachael Fee MD  This letter has been electronically signed by your physician.  Appended Document: Results Letter letter mailed

## 2010-08-21 NOTE — Assessment & Plan Note (Signed)
Summary: ec6/ syncope/ pt has medicare.  Medications Added TERAZOSIN HCL 5 MG CAPS (TERAZOSIN HCL) once daily      Allergies Added: NKDA  Visit Type:  reestablish care Primary Provider:  Willow Ora, MD  CC:  CAD.  History of Present Illness: The patient is seen for follow up of coronary disease.  He is also seen to followup in emergency room visit for presyncope.  I saw the patient last in the office in December, 2007.  He does have coronary disease area he had a non-STEMI in 2007.  It is totally occluded circumflex was open with a Taxus stent placement.  He has residual 80% LAD and 50% right coronary disease.  He has not had any chest pain.  I have reviewed my past records from 2007.  I reviewed the emergency room note from October 31 of 2011.  I have reviewed the other office records. I have updated the past medical history extensively.  Current Medications (verified): 1)  Amantadine Hcl 100 Mg Tabs (Amantadine Hcl) .... Take 1 Tablet By Mouth Three Times A Day 2)  Lantus 100 Unit/ml  Soln (Insulin Glargine) .... 40 Units At Bedtime 3)  Aspirin Ec 325 Mg Tbec (Aspirin) .Marland Kitchen.. 1 A Day 4)  Simvastatin 40 Mg Tabs (Simvastatin) .Marland Kitchen.. 1 By Mouth At Bedtime 5)  Humalog Pen 100 Unit/ml Soln (Insulin Lispro (Human)) .... 8 Units Per Day After Each Meal 6)  Finasteride 5 Mg Tabs (Finasteride) .... Take One By Mouth Once Daily 7)  Terazosin Hcl 5 Mg Caps (Terazosin Hcl) .... Once Daily  Allergies (verified): No Known Drug Allergies  Past History:  Past Medical History: HYPERLIPIDEMIA CARDIOMYOPATHY, ISCHEMIC EF 45%..... echo.. January, 2008... severe hypokinesis inferior and posterior walls at base and mid ventricle Pulmonary hypertension    moderate.... echo.... January, 2008 CAD   non-STEMI.. 2007.Marland Kitchen occluded circumflex... Taxus stent placed.... residual 80% LAD.Marland Kitchen 50% RCA CHF  UNSPECIFIED SYSTOLIC HEART FAILURE CELLULITIS.. SYNCOPE  HEALTH SCREENING  PROSTATE SPECIFIC ANTIGEN,  ELEVATED  FAMILY HISTORY OF CAD MALE 1ST DEGREE RELATIVE <50  DM  MULTIPLE SCLEROSIS increased PSA Lung nodule-- resolved per 11-2006 CT chest  Enterococcus urinary tract infection per urine culture January 19,2008. Syncope     near syncope... ER visit... May 15, 2010 Tobacco abuse  Review of Systems       Patient denies fever, chills, headache, sweats, rash, change in vision, change in hearing, chest pain, cough, nausea vomiting, urinary symptoms.  All of the systems are reviewed and are negative.  Vital Signs:  Patient profile:   70 year old male Height:      71 inches Pulse rate:   85 / minute BP sitting:   126 / 78  (left arm) Cuff size:   regular  Vitals Entered By: Hardin Negus, RMA (June 07, 2010 11:33 AM)  Physical Exam  General:  The patient is stable for him today.  With his multiple sclerosis he is wheelchair-bound. Head:  head is atraumatic. Eyes:  no xanthelasma. Neck:  no jugular venous extension. Chest Wall:  no chest wall tenderness. Lungs:  lungs are clear.  Respiratory effort is nonlabored. Heart:  cardiac exam reveals S1-S2.  There no clicks or significant murmurs. Abdomen:  abdomen is soft. Msk:  patient has areas of weakness related to his multiple sclerosis. Extremities:  no peripheral edema. Skin:  no skin rashes. Psych:  patient is oriented to person time and place.  Affect is normal.   Impression & Recommendations:  Problem #  1:  HYPERLIPIDEMIA (ICD-272.4)  His updated medication list for this problem includes:    Simvastatin 40 Mg Tabs (Simvastatin) .Marland Kitchen... 1 by mouth at bedtime Lipids are treated.  No change in therapy.  Problem # 2:  CORONARY ARTERY DISEASE (ICD-414.00)  His updated medication list for this problem includes:    Aspirin Ec 325 Mg Tbec (Aspirin) .Marland Kitchen... 1 a day Coronary disease is stable.  His stent was placed in 2007.  He is not having any significant symptoms.  I've chosen not to proceed with any exercise testing at  this time.  Problem # 3:  SYNCOPE (ICD-780.2)  His updated medication list for this problem includes:    Aspirin Ec 325 Mg Tbec (Aspirin) .Marland Kitchen... 1 a day Etiology of the event that took him to the emergency room is not clear.  This resolved rapidly.  There is no evidence of myocardial ischemia or of significant arrhythmias.  He says that rarely in the past he has had a spell that has been attributed to his multiple sclerosis.  He is stable at this time.  Because he has known vascular disease I will schedule a carotid Doppler.  I do not have any records that he has ever had a carotid Doppler in the past.  We will be in touch with him with the information. There is a possibility that the patient may have had a urinary tract infection around the time of this event.  He says that his urine was cloudy and improved with antibiotics.  Culture was negative I do not know when it was drawn relative to the antibiotics.  Orders: Carotid Duplex (Carotid Duplex)  Problem # 4:  MULTIPLE SCLEROSIS (ICD-340) Multiple sclerosis certainly affects him but he manages well.  His wife is here with him and she is very supportive.  I'll see him back for cardiology follow up in one year.  Problem # 5:  TOBACCO ABUSE (ICD-305.1) The patient is smoking.  He is actively cutting down at this time and is working towards stopping.  I counseled him and encouraged him to stop completely.  Patient Instructions: 1)  Your physician has requested that you have a carotid duplex. This test is an ultrasound of the carotid arteries in your neck. It looks at blood flow through these arteries that supply the brain with blood. Allow one hour for this exam. There are no restrictions or special instructions. 2)  Your physician wants you to follow-up in:  1 year.  You will receive a reminder letter in the mail two months in advance. If you don't receive a letter, please call our office to schedule the follow-up appointment.

## 2010-08-21 NOTE — Procedures (Signed)
Summary: Colonoscopy  Patient: [name] Note: All result statuses are Final unless otherwise noted.  Tests: (1) Colonoscopy (COL)   COL Colonoscopy           DONE     [facility]     [address] [place], [place]  [address]           COLONOSCOPY PROCEDURE REPORT           PATIENT:  Jaime [name], Jaime [name]  MR#:  [id number]     BIRTHDATE:  January 02, 1941, 68 yrs. old  GENDER:  male     ENDOSCOPIST:  Rachael Fee, MD           PROCEDURE DATE:  11/08/2009     PROCEDURE:  Colonoscopy with snare polypectomy     ASA CLASS:  Class II     INDICATIONS:  history of adenomatous polyps (2007)     MEDICATIONS:   Fentanyl 50 mcg IV, Versed 6 mg IV     DESCRIPTION OF PROCEDURE:   After the risks benefits and     alternatives of the procedure were thoroughly explained, informed     consent was obtained.  Digital rectal exam was performed and     revealed no rectal masses.   The  endoscope was introduced through     the anus and advanced to the cecum, which was identified by both     the appendix and ileocecal valve, without limitations.  The     quality of the prep was good, using MoviPrep.  The instrument was     then slowly withdrawn as the colon was fully examined.     <<PROCEDUREIMAGES>>     FINDINGS:  A diminutive polyp was found in the distal transverse     colon. This was 3mm across, removed with cold snare and sent to     pathology (jar 1) (see image4).  Mild diverticulosis was found.     There were a few small scattered diverticulum throughout colon.     This was otherwise a normal examination of the colon (see image3,     image2, and image5).   Retroflexed views in the rectum revealed no     abnormalities.    The scope was then withdrawn from the patient     and the procedure completed.           COMPLICATIONS:  None           ENDOSCOPIC IMPRESSION:     1) Diminutive polyp in the distal transverse colon, removed and     sent to pathology     2) Mild  diverticulosis     3) Otherwise normal examination           RECOMMENDATIONS:     1) Given your personal history of precancerous polyps, you will     need a colonoscopy again in 5 years even if the polyp removed     today is NOT precancerous.     2) You will receive a letter within 1-2 weeks with the results     of your biopsy as well as final recommendations. Please call my     office if you have not received a letter after 3 weeks.           ______________________________     Rachael Fee, MD           n.     eSIGNEDMelton Alar.  Jacobs at 11/08/2009 09:29 AM           Elmer Picker, [phone]  Note: An exclamation mark (!) indicates a result that was not dispersed into the flowsheet. Document Creation Date: 11/08/2009 9:29 AM _______________________________________________________________________  (1) Order result status: Final Collection or observation date-time: 11/08/2009 09:17 Requested date-time:  Receipt date-time:  Reported date-time:  Referring Physician:   Ordering Physician: Rob Bunting [phone]) Specimen Source:  Source: Launa Grill Order Number: [id number] Lab site:   Appended Document: Colonoscopy    Clinical Lists Changes  Observations: Added new observation of COLONNXTDUE: 10/2014 (11/14/2009 8:14)      Appended Document: Colonoscopy recall in IDX and EMR

## 2010-08-21 NOTE — Progress Notes (Signed)
Summary: FYI ED//Ramey Schiff  Phone Note Call from Patient Call back at Home Phone [phone]   Caller: Spouse Summary of Call: pt wife called, pt is having intestinal virus x 4 days, now has watery bloody stool.  Recommend ED wife agreed, she will take pt to [facility] Initial call taken by: Kandice Hams,  July 24, 2009 10:52 AM

## 2010-08-23 NOTE — Letter (Signed)
Summary: Generic Letter  [facility] Main [facility]  [address]   [place], [place] [address]   Phone: [phone]  Fax: [phone]        July 26, 2010 MRN: [id number]    Jaime [name] [address] [place], [place]  [address]    Dear [name],  Our records indicate that you missed your appointment for your carotid ultrasound.  It is Dr Myrtis Ser recommendation that you have this test done.  Please call our office at [phone] to reschedule.     Sincerely,  Meredith Staggers, RN Willa Rough, MD  This letter has been electronically signed by your physician.

## 2010-08-24 ENCOUNTER — Ambulatory Visit (INDEPENDENT_AMBULATORY_CARE_PROVIDER_SITE_OTHER): Payer: Medicare Other | Admitting: Internal Medicine

## 2010-08-24 ENCOUNTER — Encounter: Payer: Self-pay | Admitting: Cardiovascular Disease

## 2010-08-24 ENCOUNTER — Encounter: Payer: Self-pay | Admitting: Internal Medicine

## 2010-08-24 DIAGNOSIS — M25519 Pain in unspecified shoulder: Secondary | ICD-10-CM | POA: Insufficient documentation

## 2010-08-27 ENCOUNTER — Encounter (INDEPENDENT_AMBULATORY_CARE_PROVIDER_SITE_OTHER): Payer: Medicare Other

## 2010-08-27 ENCOUNTER — Encounter: Payer: Self-pay | Admitting: Cardiology

## 2010-08-27 DIAGNOSIS — R55 Syncope and collapse: Secondary | ICD-10-CM

## 2010-08-27 DIAGNOSIS — I6529 Occlusion and stenosis of unspecified carotid artery: Secondary | ICD-10-CM

## 2010-08-29 NOTE — Miscellaneous (Signed)
Summary: Orders Update  Clinical Lists Changes  Orders: Added new Test order of Carotid Duplex (Carotid Duplex) - Signed 

## 2010-08-29 NOTE — Progress Notes (Signed)
Summary: Catheter Supplies  Phone Note Outgoing Call   Call placed by: Army Fossa CMA,  August 21, 2010 8:54 AM Summary of Call: Received a fax from [facility]- Dr. Drue Novel needs to know what Catherter Supplies the pt needs. (Form is on my desk if anyone needs)  I Left message for pt to call back. Army Fossa CMA  August 21, 2010 8:55 AM   Follow-up for Phone Call        spouse Santina Evans) called to report that patient needs a external condom catherter and leg bag; he also needs to order a new meter, test strips and lancets.Jerolyn Shin  August 22, 2010 9:46 AM  Additional Follow-up for Phone Call Additional follow up Details #1::        order signed [facility] E. Malaki Koury MD  August 22, 2010 11:16 AM

## 2010-09-06 NOTE — Letter (Signed)
Summary: Diabetic Medical Supplies  Diabetic Medical Supplies   Imported By: Kassie Mends 08/30/2010 10:37:25  _____________________________________________________________________  External Attachment:    Type:   Image     Comment:   External Document

## 2010-09-06 NOTE — Assessment & Plan Note (Signed)
Summary: discuss tendontitis    Vital Signs:  Patient profile:   70 year old male Pulse rate:   76 / minute Pulse rhythm:   regular BP sitting:   142 / 82  (left arm) Cuff size:   regular  Vitals Entered By: Army Fossa CMA (August 24, 2010 10:19 AM) CC: Pt here to f/u on tendontitis  Comments BP has been elevated  fasting  [facility]    History of Present Illness: 2 weeks history of left shoulder pain, points to the trapezoid area. A week ago, went to urgent care,  got a  IM injection of Toradol which worked for 6 days. Pain resurfaced yesterday and is exacerbated by picking up things with the left arm  ROS Denies neck pain Denies tingling in his left upper extremity Muscular weakness at baseline as far as his diabetes, he sees endocrinology and his next appointment is in April he saw cardiology 11-11 for syncope, they recommended a carotid ultrasound ...was canceled by the patient  Current Medications (verified): 1)  Humalog Pen 100 Unit/ml Soln (Insulin Lispro (Human)) .... 30 Before Breakfast, 10 Before Lunch, 30 Be Fore Dinner  Allergies (verified): No Known Drug Allergies  Past History:  Past Medical History: Reviewed history from 06/07/2010 and no changes required. HYPERLIPIDEMIA CARDIOMYOPATHY, ISCHEMIC EF 45%..... echo.. January, 2008... severe hypokinesis inferior and posterior walls at base and mid ventricle Pulmonary hypertension    moderate.... echo.... January, 2008 CAD   non-STEMI.. 2007.Marland Kitchen occluded circumflex... Taxus stent placed.... residual 80% LAD.Marland Kitchen 50% RCA CHF  UNSPECIFIED SYSTOLIC HEART FAILURE CELLULITIS.. SYNCOPE  HEALTH SCREENING  PROSTATE SPECIFIC ANTIGEN, ELEVATED  FAMILY HISTORY OF CAD MALE 1ST DEGREE RELATIVE <50  DM  MULTIPLE SCLEROSIS increased PSA Lung nodule-- resolved per 11-2006 CT chest  Enterococcus urinary tract infection per urine culture January 19,2008. Syncope     near syncope... ER visit... May 15, 2010 Tobacco abuse  Past Surgical History: Reviewed history from 07/31/2007 and no changes required. hernia repair  (R)  Social History: Reviewed history from 09/19/2009 and no changes required. Married children x 2  Current Smoker, has cut down "1 pack a month" Alcohol use-no    Physical Exam  General:  alert and well-developed.  no apparent distress Neck:  note tender of the cervical spine to palpation Extremities:  passive range of motion of the left shoulder without problems, not tender at the a.c. joint, no tender of the deltoid muscle. Strength of the left arm seems to be at baseline   Impression & Recommendations:  Problem # 1:  SHOULDER PAIN (ICD-719.41) shoulder pain, likely deltoid strain history of good tolerance to NSAIDs See instructions The following medications were removed from the medication list:    Aspirin Ec 325 Mg Tbec (Aspirin) .Marland Kitchen... 1 a day  Complete Medication List: 1)  Humalog Pen 100 Unit/ml Soln (Insulin lispro (human)) .... 30 before breakfast, 10 before lunch, 30 be fore dinner  Patient Instructions: 1)  take Aleve 220 over the counter one   tablets twice a day as needed. Always take it with food. 2)  Stop  Aleve if you feel nausea or stomach pain 3)  warm compress twice a day 4)  Call if the shoulder is not improving in 3 weeks   Orders Added: 1)  Est. Patient Level III [82956]

## 2010-09-19 ENCOUNTER — Encounter: Payer: Self-pay | Admitting: Internal Medicine

## 2010-10-02 NOTE — Medication Information (Signed)
Summary: Order for Diabetic Testing Supplies   Order for Diabetic Testing Supplies   Imported By: Maryln Gottron 09/26/2010 10:26:11  _____________________________________________________________________  External Attachment:    Type:   Image     Comment:   External Document

## 2010-10-03 LAB — POCT CARDIAC MARKERS
CKMB, poc: <1 ng/mL — ABNORMAL LOW (ref 1.0–8.0)
Myoglobin, poc: 94.6 ng/mL (ref 12–200)

## 2010-10-03 LAB — URINALYSIS, ROUTINE W REFLEX MICROSCOPIC
Glucose, UA: NEGATIVE mg/dL
Hgb urine dipstick: NEGATIVE
Protein, ur: NEGATIVE mg/dL
Urobilinogen, UA: 1 mg/dL (ref 0.0–1.0)

## 2010-10-03 LAB — DIFFERENTIAL
Basophils Absolute: 0 10*3/uL (ref 0.0–0.1)
Basophils Relative: 0 % (ref 0–1)
Eosinophils Absolute: 0.1 10*3/uL (ref 0.0–0.7)
Monocytes Absolute: 0.5 10*3/uL (ref 0.1–1.0)
Monocytes Relative: 9 % (ref 3–12)

## 2010-10-03 LAB — POCT I-STAT, CHEM 8
BUN: 14 mg/dL (ref 6–23)
Creatinine, Ser: 1.2 mg/dL (ref 0.4–1.5)
Hemoglobin: 15 g/dL (ref 13.0–17.0)
Potassium: 3.6 mEq/L (ref 3.5–5.1)
Sodium: 141 mEq/L (ref 135–145)

## 2010-10-03 LAB — URINE MICROSCOPIC-ADD ON

## 2010-10-03 LAB — URINE CULTURE: Culture: NO GROWTH

## 2010-10-03 LAB — CBC
HCT: 40.6 % (ref 39.0–52.0)
Hemoglobin: 13.7 g/dL (ref 13.0–17.0)
MCH: 26.5 pg (ref 26.0–34.0)
MCHC: 33.7 g/dL (ref 30.0–36.0)
RDW: 16.1 % — ABNORMAL HIGH (ref 11.5–15.5)

## 2010-10-07 LAB — CBC
HCT: 49.3 % (ref 39.0–52.0)
Hemoglobin: 16.5 g/dL (ref 13.0–17.0)
MCV: 82.7 fL (ref 78.0–100.0)
RBC: 5.96 MIL/uL — ABNORMAL HIGH (ref 4.22–5.81)
WBC: 5.9 10*3/uL (ref 4.0–10.5)

## 2010-10-07 LAB — URINALYSIS, ROUTINE W REFLEX MICROSCOPIC
Bilirubin Urine: NEGATIVE
Glucose, UA: >1000 mg/dL — AB
Ketones, ur: 15 mg/dL — AB
Nitrite: NEGATIVE
Specific Gravity, Urine: 1.036 — ABNORMAL HIGH (ref 1.005–1.030)
pH: 5.5 (ref 5.0–8.0)

## 2010-10-07 LAB — DIFFERENTIAL
Basophils Relative: 0 % (ref 0–1)
Eosinophils Relative: 2 % (ref 0–5)
Lymphs Abs: 1.8 10*3/uL (ref 0.7–4.0)
Monocytes Absolute: 0.4 10*3/uL (ref 0.1–1.0)
Monocytes Relative: 7 % (ref 3–12)
Neutro Abs: 3.6 10*3/uL (ref 1.7–7.7)

## 2010-10-07 LAB — BASIC METABOLIC PANEL
BUN: 15 mg/dL (ref 6–23)
Chloride: 105 mEq/L (ref 96–112)
GFR calc non Af Amer: >60 mL/min (ref >60)
Potassium: 4 mEq/L (ref 3.5–5.1)
Sodium: 140 mEq/L (ref 135–145)

## 2010-10-07 LAB — URINE MICROSCOPIC-ADD ON

## 2010-10-09 ENCOUNTER — Emergency Department (HOSPITAL_COMMUNITY)
Admission: EM | Admit: 2010-10-09 | Discharge: 2010-10-09 | Disposition: A | Discharge status: Home / Self Care | Payer: Medicare Other | Attending: Emergency Medicine | Admitting: Emergency Medicine

## 2010-10-09 ENCOUNTER — Emergency Department (HOSPITAL_COMMUNITY): Payer: Medicare Other

## 2010-10-09 ENCOUNTER — Inpatient Hospital Stay (INDEPENDENT_AMBULATORY_CARE_PROVIDER_SITE_OTHER)
Admission: RE | Admit: 2010-10-09 | Discharge: 2010-10-09 | Disposition: A | Discharge status: Home / Self Care | Payer: Medicare Other | Source: Ambulatory Visit | Attending: Family Medicine | Admitting: Family Medicine

## 2010-10-09 DIAGNOSIS — K5900 Constipation, unspecified: Secondary | ICD-10-CM | POA: Insufficient documentation

## 2010-10-09 DIAGNOSIS — I252 Old myocardial infarction: Secondary | ICD-10-CM | POA: Insufficient documentation

## 2010-10-09 DIAGNOSIS — R42 Dizziness and giddiness: Secondary | ICD-10-CM | POA: Insufficient documentation

## 2010-10-09 DIAGNOSIS — G35 Multiple sclerosis: Secondary | ICD-10-CM | POA: Insufficient documentation

## 2010-10-09 DIAGNOSIS — R109 Unspecified abdominal pain: Secondary | ICD-10-CM | POA: Insufficient documentation

## 2010-10-09 DIAGNOSIS — N200 Calculus of kidney: Secondary | ICD-10-CM | POA: Insufficient documentation

## 2010-10-09 DIAGNOSIS — Z794 Long term (current) use of insulin: Secondary | ICD-10-CM | POA: Insufficient documentation

## 2010-10-09 DIAGNOSIS — R7989 Other specified abnormal findings of blood chemistry: Secondary | ICD-10-CM

## 2010-10-09 DIAGNOSIS — E119 Type 2 diabetes mellitus without complications: Secondary | ICD-10-CM | POA: Insufficient documentation

## 2010-10-09 DIAGNOSIS — Z79899 Other long term (current) drug therapy: Secondary | ICD-10-CM | POA: Insufficient documentation

## 2010-10-09 DIAGNOSIS — R339 Retention of urine, unspecified: Secondary | ICD-10-CM | POA: Insufficient documentation

## 2010-10-09 LAB — COMPREHENSIVE METABOLIC PANEL
Alkaline Phosphatase: 63 U/L (ref 39–117)
BUN: 13 mg/dL (ref 6–23)
Chloride: 107 mEq/L (ref 96–112)
Creatinine, Ser: 1.41 mg/dL (ref 0.4–1.5)
GFR calc non Af Amer: 50 mL/min — ABNORMAL LOW (ref >60)
Glucose, Bld: 394 mg/dL — ABNORMAL HIGH (ref 70–99)
Potassium: 4.3 mEq/L (ref 3.5–5.1)
Total Bilirubin: 0.9 mg/dL (ref 0.3–1.2)

## 2010-10-09 LAB — URINALYSIS, ROUTINE W REFLEX MICROSCOPIC
Bilirubin Urine: NEGATIVE
Glucose, UA: 500 mg/dL — AB
Protein, ur: 30 mg/dL — AB

## 2010-10-09 LAB — URINE MICROSCOPIC-ADD ON

## 2010-10-09 LAB — CBC
HCT: 47.6 % (ref 39.0–52.0)
Hemoglobin: 13.8 g/dL (ref 13.0–17.0)
Platelets: 124 10*3/uL — ABNORMAL LOW (ref 150–400)
RBC: 5.23 MIL/uL (ref 4.22–5.81)
RBC: 6.04 MIL/uL — ABNORMAL HIGH (ref 4.22–5.81)
RDW: 16.5 % — ABNORMAL HIGH (ref 11.5–15.5)
RDW: 17.4 % — ABNORMAL HIGH (ref 11.5–15.5)
WBC: 12.7 10*3/uL — ABNORMAL HIGH (ref 4.0–10.5)
WBC: 5.3 10*3/uL (ref 4.0–10.5)

## 2010-10-09 LAB — DIFFERENTIAL
Basophils Absolute: 0 10*3/uL (ref 0.0–0.1)
Eosinophils Absolute: 0 10*3/uL (ref 0.0–0.7)
Eosinophils Relative: 0 % (ref 0–5)
Lymphocytes Relative: 6 % — ABNORMAL LOW (ref 12–46)
Lymphs Abs: 0.8 10*3/uL (ref 0.7–4.0)
Neutrophils Relative %: 87 % — ABNORMAL HIGH (ref 43–77)

## 2010-10-09 LAB — GLUCOSE, CAPILLARY
Glucose-Capillary: 240 mg/dL — ABNORMAL HIGH (ref 70–99)
Glucose-Capillary: 140 mg/dL — ABNORMAL HIGH (ref 70–99)

## 2010-10-09 LAB — BASIC METABOLIC PANEL
Calcium: 8.7 mg/dL (ref 8.4–10.5)
GFR calc Af Amer: >60 mL/min (ref >60)
GFR calc non Af Amer: >60 mL/min (ref >60)
Sodium: 139 mEq/L (ref 135–145)

## 2010-10-09 LAB — POCT CARDIAC MARKERS: Myoglobin, poc: >500 ng/mL (ref 12–200)

## 2010-10-09 LAB — PROTIME-INR: INR: 1.05 (ref 0.00–1.49)

## 2010-10-09 LAB — APTT: aPTT: 29 seconds (ref 24–37)

## 2010-10-11 LAB — URINE CULTURE

## 2010-10-20 ENCOUNTER — Encounter: Payer: Self-pay | Admitting: Internal Medicine

## 2010-10-23 ENCOUNTER — Ambulatory Visit (INDEPENDENT_AMBULATORY_CARE_PROVIDER_SITE_OTHER): Payer: Medicare Other | Admitting: Internal Medicine

## 2010-10-23 ENCOUNTER — Encounter: Payer: Self-pay | Admitting: Internal Medicine

## 2010-10-23 DIAGNOSIS — R197 Diarrhea, unspecified: Secondary | ICD-10-CM | POA: Insufficient documentation

## 2010-10-23 DIAGNOSIS — E785 Hyperlipidemia, unspecified: Secondary | ICD-10-CM

## 2010-10-23 DIAGNOSIS — I509 Heart failure, unspecified: Secondary | ICD-10-CM

## 2010-10-23 DIAGNOSIS — E119 Type 2 diabetes mellitus without complications: Secondary | ICD-10-CM

## 2010-10-23 DIAGNOSIS — G35 Multiple sclerosis: Secondary | ICD-10-CM

## 2010-10-23 DIAGNOSIS — M25519 Pain in unspecified shoulder: Secondary | ICD-10-CM

## 2010-10-23 DIAGNOSIS — I2510 Atherosclerotic heart disease of native coronary artery without angina pectoris: Secondary | ICD-10-CM

## 2010-10-23 NOTE — Assessment & Plan Note (Signed)
Per Dr Talmage Nap, having low sugars lately, rec to call them ASAP

## 2010-10-23 NOTE — Patient Instructions (Signed)
If the diarrhea is not improving in 3 weeks or if it gets worse -------> please call us  Take your cholesterol med every day Call Dr Talmage Nap ASAP ref. Low sugar

## 2010-10-23 NOTE — Assessment & Plan Note (Signed)
Now back on meds, wife "making sure he takes it" Labs on RTC

## 2010-10-23 NOTE — Progress Notes (Signed)
  Subjective:    Patient ID: Jaime [name], male    DOB: [birth year], 70 y.o.   MRN: [id number]  HPI Shoulder pain -- was seen here 2-11, sx increased , went to a UC, Rx meloxicam (per pt) it helped some. Still hurting DM-- has an appointment w/ Dr Talmage Nap in 2 weeks, c/o low sugar x 3 months since they changed his meds; his appetite is also decreased, not eating as much Cholesterol-- now on statins MS-- developed urinary incontinence, saw urology, they Rx proscar, terazosin  Past Medical History  Diagnosis Date  . Hyperlipidemia   . Cardiomyopathy, ischemic     EF 45% per ECHO 2008  . Pulmonary hypertension     moderate ECHO Jan 2008  . CAD (coronary artery disease)     non-STEMI, 2007.Marland Kitchenoccluded circumflex.. Taxus stent placed...residual 80% LAD...50% RCA  . CHF (congestive heart failure)   . Systolic heart failure   . Multiple sclerosis   . Diabetes mellitus   . Lung nodule     resolved 11-2006 CT Chest  . Tobacco abuse   . Increased prostate specific antigen (PSA) velocity   . Urinary retention     dx ~ 2-12, like from MS, now with a catheter, saw urology     Review of Systems  Cardiovascular: Negative for chest pain, palpitations and leg swelling.  Gastrointestinal: Negative for nausea, vomiting and abdominal pain.       Diarrhea x 2 weeks; several BMs a day, no blood (initially he had)  Genitourinary:       Developed urinary retention, see PMH       Objective:   Physical Exam  Vitals reviewed. Constitutional: He appears well-developed and well-nourished.  Cardiovascular: Normal rate, regular rhythm and normal heart sounds.   No murmur heard. Pulmonary/Chest: Effort normal and breath sounds normal. No respiratory distress. He has no wheezes. He has no rales.  Musculoskeletal: He exhibits no edema.  Psychiatric: He has a normal mood and affect. His behavior is normal. Judgment and thought content normal.       Good spirits          Assessment & Plan:  Today ,  I spent more than 25 min with the patient, >50% of the time counseling, and /or reviewing the chart and labs ordered by other providers (from 3-20)

## 2010-10-23 NOTE — Assessment & Plan Note (Signed)
For

## 2010-10-23 NOTE — Assessment & Plan Note (Signed)
Per neurology, on amantadine, disease progressing, now with bladder retention

## 2010-10-23 NOTE — Assessment & Plan Note (Signed)
Not fluid overload at present

## 2010-10-23 NOTE — Assessment & Plan Note (Signed)
On going issue , refer to ortho

## 2010-10-23 NOTE — Assessment & Plan Note (Signed)
asx

## 2010-10-24 ENCOUNTER — Other Ambulatory Visit: Payer: Self-pay | Admitting: Internal Medicine

## 2010-10-24 NOTE — Telephone Encounter (Signed)
Pharm did not get refill

## 2010-12-07 NOTE — Discharge Summary (Signed)
Jaime [name], Jaime [name] NO.:  [id number]   MEDICAL RECORD NO.:  [id number]          PATIENT TYPE:  INP   LOCATION:  4703                         FACILITY:  [facility]   PHYSICIAN:  Luis Abed, MD, FACCDATE OF BIRTH:  30-Jan-1941   DATE OF ADMISSION:  04/30/2006  DATE OF DISCHARGE:  05/05/2006                                 DISCHARGE SUMMARY   PRINCIPAL DIAGNOSIS:  Non ST-elevation myocardial infarction.   SECONDARY DIAGNOSES:  1. Coronary artery disease.  2. Ischemic cardiomyopathy.  Ejection fraction 40-45%.  3. Type 2 diabetes mellitus x5 years.  4. Multiple sclerosis diagnosed in 1994.  5. Ongoing tobacco abuse.  6. Hypertension.  7. Hyperlipidemia.  8. 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  9. Community-acquired pneumonia.  10.Fevers.   ALLERGIES:  DITROPAN.   PROCEDURE:  Left heart cardiac catheterization with successful PCI and  stenting of the left circumflex with placement of a 2.5 x 20 mm Taxus drug-  eluting stent.   HISTORY OF PRESENT ILLNESS:  This is a 70 year old African-American male  with no prior history of coronary artery disease who has been living with  multiple sclerosis since 1994.  He has recently noticed increasing fatigue  which he attributed to his multiple sclerosis, however, this was  unresponsive to steroids.  On the evening of April 29, 2006, he had an  episode of what he describes as indigestion associated with shortness of  breath and diaphoresis that occurred while moving from his chair to his bed.  Symptoms resolved after approximately 30 minutes with Pepto Bismol.  He saw  Dr. Drue Novel on October 10 and reported the symptoms from the evening before and  an EKG was performed revealing anterolateral ST depression with poor R-wave  progression.  After review with Dr. Myrtis Ser, decision was made to send him to  the emergency room for additional evaluation.  In the emergency room, he was  admitted to the [facility]  Cardiology Service.   HOSPITAL COURSE:  [name] ruled in for a non ST-elevation MI which was  likely out of hospital as his initial cardiac markers revealed a CK of 855,  an MB of 25.2 and troponin-I of 24.02.  he was placed on heparin, beta  blocker, aspirin and statin therapy; and underwent left heart cardiac  catheterization on October 11 revealing a totally occluded proximal left  circumflex as well as an 80% stenosis in the mid-LAD and a 50% proximal  stenosis in the RCA.  EF was measured at 40% with inferolateral akinesis.  The circumflex was successfully opened with a 2.5 x 20 mm Taxus drug-eluting  stent.  The LAD was not approached and we will pursue an adenosine Myoview  to be performed as an outpatient to determine the ischemic significance of  that region.  Postprocedure, [name] has not experienced any recurrent  chest discomfort, however, he has been running low grade fevers and has also  been tachycardic.  In order to make room for beta blocker therapy, we had to  discontinue his ACE inhibitor.  We have titrated his Coreg to 6.25 mg b.i.d.  A 2-D echocardiogram was also performed revealing an EF of 40-45%.  Secondary to recurrent low grade fevers and tachycardia, a chest x-ray was  performed revealing bibasilar atelectasis/infiltrate and he was placed on  Avalox therapy.  Repeat chest x-ray on October 14 showed no active disease.  Blood cultures were also performed and revealed no growth x4 bottles.  Urinalysis was negative.  [name] has been afebrile since October 14 and  again, has offered no complaints.  He is being discharged home today in  satisfactory condition.   DISCHARGE LABS:  Hemoglobin 11, hematocrit 33.5, WBC 6.3, platelets 320,  sodium 138, potassium 3.8, chloride 107, CO2 21, BUN 11, creatinine 0.9,  glucose 111, total bilirubin 0.7, alkaline phosphatase 68, AST 90, ALT 36,  total protein 6.8, albumin 2.8, calcium 8.1.  hemoglobin A1c 7.5.  CK 444,   MB 17, troponin-I 17.97.  BNP on October 13 was 831.  Total cholesterol 132,  triglycerides 86, HDL 30, LDL 85.  TSH 1.226.  Blood cultures are negative  x4.  Urinalysis was negative.   DISPOSITION:  The patient is being discharged home today in good condition.   DISCHARGE FOLLOWUP:  He has a followup appointment with Dr. Willow Ora on  October 22 at 9:30 a.m.  He is then to follow up with Dr. Willa Rough on  November 15 at 9:30 a.m.  At that time, decision will be made regarding  scheduling an adenosine Myoview to reevaluate the significance of LAD  ischemia.   DISCHARGE MEDICATIONS:  1. Coreg 6.25 mg b.i.d.  2. Aspirin 325 mg once daily.  3. Plavix 75 mg once daily.  4. Lipitor 80 mg q.h.s.  5. Lisinopril 2.5 mg once daily.  6. Avalox 400 mg once daily x3 more days.  7. Proscar 5 mg once daily.  8. Amantidine 100 mg t.i.d.  9. Glucovance 5/500 mg b.i.d.  10.Nitroglycerin 0.4 mg sublingual p.r.n. chest pain.   OUTSTANDING LAB STUDIES:  None.   DURATION DISCHARGE ENCOUNTER:  Forty minutes including dictation time.     ______________________________  Nicolasa Ducking, ANP    ______________________________  Luis Abed, MD, [facility]    CB/MEDQ  D:  05/05/2006  T:  05/05/2006  Job:  [id number]   cc:   Willow Ora, MD

## 2010-12-07 NOTE — Discharge Summary (Signed)
NAMETOBEY, SCHMELZLE              ACCOUNT NO.:  [id number]   MEDICAL RECORD NO.:  [id number]          PATIENT TYPE:  INP   LOCATION:  6738                         FACILITY:  [facility]   PHYSICIAN:  Valerie A. Felicity Coyer, MDDATE OF BIRTH:  Dec 11, 1940   DATE OF ADMISSION:  08/05/2006  DATE OF DISCHARGE:  08/11/2006                               DISCHARGE SUMMARY   DISCHARGE DIAGNOSIS:  1. Right upper lobe and right lower lobe pneumonia.  2. Enterococcus urinary tract infection.  3. Diabetes, type 2.  4. Muscular sclerosis with chronic debilitation.  5. Dyslipidemia.  6. Hypotension.   HISTORY OF PRESENT ILLNESS:  Mr. Mikesell is a 70 year old African  American male with a history of MS and coronary artery disease who  presented to the [facility] ER with a 2-week history of increased chest  congestion which was associated with right-sided chest tightness.  He  also had some shortness of breath and cough which was nonproductive.  The patient was admitted for further evaluation and treatment.   PAST MEDICAL HISTORY:  1. MS  2. Diabetes, type 2.  3. Coronary artery disease  4. Ischemic cardiomyopathy.  5. Dyslipidemia.  6. Cellulitis of testicle followed by urology.   COURSE OF HOSPITALIZATION.:  #1.  RIGHT UPPER LOBE AND RIGHT LOWER LOBE PNEUMONIA:  The patient was  treated with IV Rocephin and Zithromax which was changed to p.o. Ceftin.  He continued to improve clinically. He will be sent home on p.o.  Levaquin .  See #2.   #2.  ENTEROCOCCUS URINARY TRACT INFECTION:  The patient was noted to  have 40,000 colonies of Enterococcus in urine culture. Final sensitivity  and culture is pending.  However, the patient will be changed from p.o.  Ceftin to Levaquin for dual coverage of Enterococcus UTI as well as  pneumonia.  This will be continued for an additional 7 days.   #3.  DIABETES, TYPE 2, UNCONTROLLED: The patient was noted to have an  A1c of 13 during this admission. As a result,  he was started on Lantus  insulin. He was given insulin teaching during this admission.  CBG this  morning was 171. He will be sent home on 10 units of Lantus once daily  and is instructed to keep a log of his CABGs and bring this log to his  appointment with Dr. Drue Novel.  His Lantus dosing will likely need to be  titrated up as an outpatient.   #4.  HYPOTENSION: Coreg and Lotensin were held.  His blood pressure at  time of discharge is 140/95. We will resume his Coreg for cardiac  protection; however, we will defer resuming Lotensin to Dr. Drue Novel as an  outpatient.   DISCHARGE MEDICATIONS:  1. A amantadine 100 mg p.o. 3 times a day.  2. Levaquin 500 mg p.o. daily for 7 days.  3. Cozaar 5 mg p.o. daily.  4. Lipitor 80 mg p.o. daily.  5. Plavix 75 mg p.o. daily.  6. Aspirin 325 mg p.o. daily.  7. Glucovance 5/500 p.o. b.i.d.  8. Lantus insulin 10 units subcutaneously once daily in the  evenings.  9. Coreg 6.25 mg p.o. b.i.d.   DISPOSITION:  The patient will be sent home.  We will request home  health PT, OT, RN and aide.   PERTINENT LABORATORY DATA:  At time of discharge, urine culture 40,000  colonies of Enterococcus. Final sensitivity is pending. Blood culture  August 05, 2006: No growth to date. Hemoglobin 10.7, hematocrit 32.3.   FOLLOWUP:  The patient is to follow up with Dr. Willow Ora on Friday,  January 25, at 11:00 a.m. He is instructed to call Dr. Drue Novel should he  develop increased weakness, fever over 101, cough, burning on urination,  or sugars greater than 300 or less than 80.      Sandford Craze, NP      Raenette Rover. Felicity Coyer, MD  Electronically Signed    MO/MEDQ  D:  08/11/2006  T:  08/11/2006  Job:  [id number]   cc:   Willow Ora, MD

## 2010-12-07 NOTE — Consult Note (Signed)
Jaime [name], HOTT NO.:  [id number]   MEDICAL RECORD NO.:  [id number]          PATIENT TYPE:  INP   LOCATION:  2313                         FACILITY:  [facility]   PHYSICIAN:  Barbaraann Share, MD,FCCPDATE OF BIRTH:  07/09/1941   DATE OF CONSULTATION:  08/19/2006  DATE OF DISCHARGE:                                 CONSULTATION   HISTORY OF PRESENT ILLNESS:  The patient is a 70 year old black male who  I have been asked to see for an abnormal CT scan.  The patient was  recently admitted to the hospital with a right lung pneumonia and  treated appropriately and sent home.  He was then readmitted on August 17, 2006 with probable CHF exacerbation.  The patient during this  admission has had difficulty with blood pressure control and now acute  delirium.  He has had a recent CT scan of the chest because of a  worsening process where he was found to have a significant right upper  lobe mass with hilar lymphadenopathy as well as a moderate right pleural  effusion and the suggestion of edema.  The patient has had elevated  respiratory rate secondary to his acute delirium, but he has not had  increased work of breathing currently.  He has been afebrile during this  hospitalization.   PAST MEDICAL HISTORY:  1. Significant for diabetes.  2. History of multiple sclerosis.  3. History of dyslipidemia.  4. History of hypertension.  5. History of coronary artery disease with LV dysfunction and a recent      MI in October of last year.  6. History of recent enterococcal UTI.   ALLERGIES:  The patient has no known drug allergies.   FAMILY HISTORY/SOCIAL HISTORY/REVIEW OF SYSTEMS:  Were unable to be  obtained from the patient secondary to his altered mental  status/delirium.   PHYSICAL EXAM:  GENERAL:  He is a thin black male in no acute distress.  He is somewhat confused with an elevated respiratory rate but with no  increased work of breathing.  Blood pressure is  163/106, pulse is 123  but regular, respiratory rate is 22, 2 liters saturation is 99%.  He is  afebrile.  HEENT:  Pupils equal, round, and reactive to light and accommodation.  Extraocular movements were intact.  Nares and oropharynx were not able  to be examined secondary to a mask in place.  NECK:  Supple without JVD  or lymphadenopathies.  No palpable thyromegaly.  CHEST:  Reveals rhonchi throughout with basilar crackles right greater  than left.  CARDIAC:  Reveals a tachycardiac regular rhythm.  ABDOMEN:  Soft, nontender with good bowel sounds.  GENITAL/RECTAL/BREASTS:  Not done and not indicated.  LOWER EXTREMITIES:  Without edema.  Pulses are intact distally.  NEUROLOGIC:  He is very confused and disoriented.  He does move all four  extremities spontaneously.  He will not follow commands secondary to his  delirium.   Arterial blood gas was done without significant hypoxemia or  hypercarbia.   IMPRESSION:  1. Right upper lobe mass that is very suspicious for bronchogenic  cancer.  We obviously need tissue diagnosis.  Two ways that we can      obtain this is with a ultrasound-guided thoracentesis for cytology      or possibly do a transthoracic needle aspiration.  Given the      patient's delirium I would suspect the thoracentesis is the lesser      of two evils in order to obtain tissue.  If this is negative then      we can proceed with transthoracic needle aspiration.  2. Delirium of unknown etiology.  Certainly we need to rule out the      possibility of brain metastases given the CT findings.  3. Probable element of CHF as well as poorly controlled hypertension.      I will leave this to the primary care service.   SUGGESTIONS:  1. Ultrasound-guided thoracentesis for fluid cytology.  2. Check head CT with and without contrast.  If this is unrevealing      the patient may ultimately need a neuro evaluation and possibly an      MRI.  3. BP control and p.r.n. diuresis  per the primary service.      Barbaraann Share, MD,FCCP  Electronically Signed     KMC/MEDQ  D:  08/19/2006  T:  08/19/2006  Job:  [id number]   cc:   Rosalyn Gess. Norins, MD  Barbaraann Share, MD,FCCP

## 2010-12-07 NOTE — Consult Note (Signed)
NAMEBRIDGET, Jaime [name]              ACCOUNT NO.:  [id number]   MEDICAL RECORD NO.:  [id number]          PATIENT TYPE:  INP   LOCATION:  2313                         FACILITY:  [facility]   PHYSICIAN:  Lindaann Slough, M.D.  DATE OF BIRTH:  Nov 10, 1940   DATE OF CONSULTATION:  08/20/2006  DATE OF DISCHARGE:                                 CONSULTATION   REASON FOR CONSULTATION:  Distended bladder.   The patient is a 70 year old male with a  history of MS, who was  admitted on August 17, 2006 for pneumonia.  He was recently discharged  from the hospital on August 12, 2006 for community acquired pneumonia  involving the right upper and right lower lung fields.  He was  discharged home on Levaquin.  He has a history of MS, and I last saw him  in the office on August 15, 2006, for cellulitis of the scrotum.  He  had been on terazosin and finasteride for bladder outlet obstruction.  At that time, he had an external catheter, and had been voiding without  difficulty according to him.  The nurses attempted to insert a Foley  catheter, and could not get it in the bladder.  He had been passing  small blood clots.  Bladder ultrasound showed a markedly distended  bladder. I was then asked to see him for Foley catheter insertion.   PAST MEDICAL HISTORY:  Positive for:  1. Diabetes.  2. MS.  3. Coronary artery disease.   MEDICATIONS:  1. Amantadine 800 mg 3 times a day.  2. Glyburide/metformin 5/500 twice a day.  3. Aspirin 325 mg a day.  4. Lipitor 80 mg a day.  5. Coreg 6.25 mg twice a day.  6. Terazosin 5 mg.  7. Plavix 75 mg.  8. Benazepril 10 mg.  9. Finasteride 5 mg.   SOCIAL HISTORY:  He is married, has two children.  Smoked about a pack a  day for more than 50 years.  Does not drink.   FAMILY HISTORY:  Noncontributory.   REVIEW OF SYSTEMS:  As noted in the HPI.  He is wheelchair bound, and he  was admitted with pneumonia.   PHYSICAL EXAMINATION:  VITAL SIGNS:  Temperature is  97.8.  GENERAL:  He is alert and oriented.  ABDOMEN:  Soft.  Tender in the suprapubic area.  He has an external  catheter that is draining slightly bloody urine.  Penis is circumcised,  the meatus is normal.  Scrotum is normal.  At this time, he has no  cellulitis of the scrotum, and testicles are normal.   I inserted a #16 Coude catheter in the bladder, and drained about 750 cc  of slightly bloody urine.  The catheter was left indwelling.  I will  follow the patient with you.      Lindaann Slough, M.D.  Electronically Signed     MN/MEDQ  D:  08/20/2006  T:  08/21/2006  Job:  [id number]

## 2010-12-07 NOTE — H&P (Signed)
Jaime [name], Jaime [name]              ACCOUNT NO.:  [id number]   MEDICAL RECORD NO.:  [id number]          PATIENT TYPE:  INP   LOCATION:  3701                         FACILITY:  [facility]   PHYSICIAN:  Noralyn Pick. Eden Emms, MD, FACCDATE OF BIRTH:  16-Nov-1940   DATE OF ADMISSION:  04/30/2006  DATE OF DISCHARGE:                                HISTORY & PHYSICAL   PRIMARY CARDIOLOGIST:  Luis Abed, MD, [facility].   PRIMARY CARE PHYSICIAN:  Willow Ora, MD.   PATIENT PROFILE:  This is a 70 year old, African American male with a prior  history of multiple sclerosis who presents with weakness and indigestion.   PROBLEMS:  1. Multiple sclerosis diagnosed in 1994.  2. Chronic weakness since diagnosis 1.  3. Type 2 diabetes mellitus x5 years.  4. Urinary/sexual dysfunction secondary to diagnosis 1.  5. Hypertension.  6. Ongoing tobacco abuse approximately 1 pack per week, previous 50 pack-      year history.  7. History of normal left ventricular function on October 08, 2004, 2-D      echocardiogram with ejection fraction of 50-55%, no wall motion      abnormalities.   HISTORY OF PRESENT ILLNESS:  This is a 70 year old, African American male  with a history of normal LV function and multiple sclerosis.  He has been  living with weakness and dyspnea on exertion since 1994 but over the past  week has had increased weakness from the neck down.  Despite management of  Solu-Medrol he continues to have weakness.  Last night he was getting into  bed, and developed 6 out of 10 indigestion from his throat down to his  umbilicus, associated with shortness breath and diaphoresis.  He took his  amantadine and Pepto-Bismol and laid down in bed with resolution of symptoms  in approximately 30 minutes.  He saw Dr. Drue Novel today and an ECG was performed  revealing upsloping ST-segment depressions in the anterolateral leads, and  he was advised to present to the emergency department.  He is currently  symptom free and in  good spirits.   ALLERGIES:  DITROPAN.   MEDICATIONS:  1. Amantadine 1 mg t.i.d.  2. Glucovance 500/5 mg b.i.d.  3. Lisinopril 5 mg daily.  4. Proscar 5 mg daily.   FAMILY HISTORY:  Mother died at age 2 of CAD.  Father died at age 39 with  dementia.  Two brothers have coronary disease.   SOCIAL HISTORY:  He lives in [place] with his wife.  He is disabled.  He  previously worked for [employer].  He is married.  He has a 50 pack-year  history of tobacco abuse, now smoking about pack a week.  He denies any  alcohol or drugs.  He does not routinely exercise.   REVIEW OF SYSTEMS:  Positive chest pain, shortness of breath, dyspnea on  exertion, weakness, urinary straining, gastroesophageal reflux disease  symptoms and he is febrile today.   PHYSICAL EXAMINATION:  VITAL SIGNS:  Temperature 101.6, heart rate 104,  respirations 20, blood pressure 94/66, pulse oximetry 97% on room air.  GENERAL:  A pleasant  African American male in no acute distress.  Awake,  alert and oriented  x3.  NECK:  Normal carotid upstrokes, no bruits, no JVD.  LUNGS:  Respirations were unlabored.  Clear to auscultation with a few  anterior expiratory wheezes.  CARDIAC:  Regular S1 and S2, no S3, S4 or murmurs.  ABDOMEN:  Round, soft, nontender.  Nondistended.  Bowel sounds present x4.  EXTREMITIES:  Warm and dry.  No clubbing, cyanosis or edema.  Dorsalis pedis  and posterior tibial pulses 2+ bilaterally.   ACCESSORY CLINICAL FINDINGS:  Chest x-ray is pending.  Lab work is pending.  EKG shows sinus tachycardia,  left axis deviation at a rate of 102 with poor  R-wave progression, upsloping ST-segment depression in lead V3 through V6,  as well as T wave inversion in lead 1 and aVL.   ASSESSMENT/PLAN:  1. Chest pain and weakness may be multifactorial.  Symptoms resolved with      Pepto-Bismol and rest last night but interestingly were associated      shortness breath and diaphoresis.  He does have risk  factors including      tobacco use, hypertension, and diabetes.  Currently he is febrile with      amt wheezing, as well, thus it is not clear if there could be a      pulmonary cause of his chest pain.  Will plan to admit and rule out.      Will check chest x-ray, urinalysis as well as blood cultures.  If      cardiac markers are negative, plan for an outpatient dobutamine      echocardiogram.  If positive however, probable catheterization once his      febrile illness issue is worked to.  Will plan to ask internal medicine      to see him in the morning for additional evaluation.  2. Multiple sclerosis.  Will ask internal medicine to see him in the      morning.  Will continue his amantidine in the meantime.  It sounds as      though he has been having fatigue over the past week or so despite Solu-      Medrol which in the past has helped his fatigue from a multiple      sclerosis standpoint.  3. Hypertension, stable.  Continue ACE inhibitor.  4. Tobacco abuse.  Cessation strongly advised.  5. Fever.  Will check UA and blood cultures, chest x-ray and add      antibiotics if necessary.     ______________________________  Nicolasa Ducking, ANP    ______________________________  Noralyn Pick. Eden Emms, MD, [facility]    CB/MEDQ  D:  04/30/2006  T:  05/01/2006  Job:  [id number]

## 2010-12-07 NOTE — H&P (Signed)
Jaime [name], Jaime [name] NO.:  [id number]   MEDICAL RECORD NO.:  [id number]          PATIENT TYPE:  INP   LOCATION:  2313                         FACILITY:  [facility]   PHYSICIAN:  Gordy Savers, MDDATE OF BIRTH:  Oct 02, 1940   DATE OF ADMISSION:  08/16/2006  DATE OF DISCHARGE:                              HISTORY & PHYSICAL   CHIEF COMPLAINT:  Shortness of breath.   HISTORY OF PRESENT ILLNESS:  The patient is a 70 year old black  gentleman with multiple medical problems.  He was discharged from the  hospital on August 12, 2006 after inpatient treatment of community-  acquired pneumonia involving the right upper and right lower lung  fields.  He was discharged on a regimen of Levaquin.  Within 24 hours,  EMS was called due to acute shortness of breath.  This apparently  responded with oxygen therapy and he was not evaluated at the hospital.  One day prior to admission, he was seen by his primary care Jaime [name] and  medications adjusted.  Since his hospital discharge, he has had some  intermittent mild shortness of breath and a largely nonproductive cough.  On the evening of admission, he became very acutely short of breath and  presented to the emergency department in acute respiratory distress.  He  was treated with BiPAP ventilation and promptly improved.  A chest x-ray  revealed new infiltrates.  Laboratory studies also revealed a BNP of  868, white count was normal at 11.7.  The patient is now admitted for  further evaluation and treatment of his acute respiratory insufficiency.   PAST MEDICAL HISTORY:  As mentioned he was recently discharged from  hospital after treatment of right upper and right lower lobe pneumonia.  He also had an enterococcal urinary tract infection and was discharged  on Levaquin.  Urine culture and sensitivities revealed sensitivity to  vancomycin only.  He has history of type 2 diabetes, multiple sclerosis  and is wheelchair-bound.   Additionally he has dyslipidemia,  hypertension, ongoing tobacco use.  He was hospitalized in October 2007  for a non-ST segment elevation MI.  Cardiac catheterization was  performed at that time that revealed multivessel disease and ejection  fraction of 40-45%.  A Taxus stent was placed in the proximal circumflex  that was 100% occluded.   MEDICAL REGIMEN INCLUDES:  1. Amantadine 100 mg t.i.d.  2. Glyburide/metformin 5/500 b.i.d.  3. Aspirin 325 daily.  4. Lipitor 80 mg daily.  5. Coreg 6.25 mg b.i.d.  6. Terazosin 5 mg daily.  7. Plavix 75 mg daily.  8. Benazepril 10 mg daily.  9. Finasteride 5 mg daily.   PAST MEDICAL HISTORY AND FAMILY HISTORY:  Please see chart for details  of the patient's past medical history and family history.   EXAMINATION:  GENERAL:  The patient was alert in no distress at rest.  The face mask in place.  VITAL SIGNS:  Blood pressure is 118/78, pulse 74, O2 saturation 98%.  SKIN:  Warm and dry.  HEAD AND NECK:  Revealed normal pupillary responses, mild arcus senilis  noted, neck revealed no  significant neck vein distension.  CHEST:  Revealed a few coarse rhonchi.  CARDIOVASCULAR EXAM:  Revealed normal heart sounds.  No murmur.  ABDOMEN:  Soft and nontender.  There is no organomegaly.  GU:  Condom catheter in place.  EXTREMITIES:  Revealed no significant edema.  There is muscle wasting  bilaterally.  He had right greater than left leg weakness.  There is no  peripheral edema.  Peripheral pulses were faint.   IMPRESSION:  Acute respiratory insufficiency, probable residual  pneumonia and heart failure both contributing factors.   ADDITIONAL DIAGNOSES:  1. Type 2 diabetes.  2. Coronary artery disease with ischemic cardiomyopathy.  3. Multiple sclerosis.   DISPOSITION:  We will admit the patient to the ICU.  We will carefully  diurese and treat with pulmonary toilet and IV antibiotic therapy,  cardiac enzymes will be cycled, a repeat 2-D  echocardiogram will be  reviewed.      Gordy Savers, MD  Electronically Signed     PFK/MEDQ  D:  08/17/2006  T:  08/17/2006  Job:  [id number]

## 2010-12-07 NOTE — Cardiovascular Report (Signed)
NAMEPAULO, KEIMIG              ACCOUNT NO.:  [id number]   MEDICAL RECORD NO.:  [id number]          PATIENT TYPE:  INP   LOCATION:  4703                         FACILITY:  [facility]   PHYSICIAN:  Veverly Fells. Excell Seltzer, MD  DATE OF BIRTH:  18-Feb-1941   DATE OF PROCEDURE:  DATE OF DISCHARGE:                              CARDIAC CATHETERIZATION   DATE OF PROCEDURE:  May 01, 2006   PROCEDURES:  1. Left heart catheterization.  2. Selective coronary angiography.  3. Left ventricular angiography.  4. PTCA and drug-eluting stent placement in the proximal left circumflex.   INDICATION:  Mr. Hu is a very pleasant, 70 year old African-American  male who presented with a non-ST elevation myocardial infarction.  He was  subsequently referred for cardiac catheterization.  He was admitted on  October 10 and the procedure was done the following day.  He was enrolled in  the [place] study.  He is on study drug at arrival.  He is either receiving  heparin, Integrilin or Alla Feeling study drug.  We are blinded to his  medication.   PROCEDURAL DETAILS:  Risks and indications of the procedure were explained  in detail to the patient.  Informed consent was obtained.  The right groin  was prepped, draped and anesthetized with 1% lidocaine under normal sterile  conditions.  Using the modified Seldinger technique, a 6-French arterial  sheath was place in the right common femoral artery.  Multiple angiographic  views were taken of both the left and right coronary arteries with a  preformed JL-4 and preformed JR-4 6-French catheter.  Following selective  coronary angiography, a pigtail catheter was inserted into the left  ventricle and left ventriculogram was performed after LV pressures were  recorded.  Following left ventriculography, a pullback across the aortic  valve was done.   Following the diagnostic portion of the procedure, PCI of the left  circumflex was performed.  See below for  details.   FINDINGS:  Aortic pressure 93/62 with a mean of 75, left ventricular  pressure 93/11 with an end-diastolic pressure of 25.   Coronary artery angiography:  The left mainstem is very short.  It appears  there may even be a separate orifice to the LAD and left circumflex.  The  LAD has mild nonobstructive disease in its proximal segments.  In the mid-  LAD just after the first diagonal, there is a 50% area of stenosis that is  calcified.  There is an 80% second diagonal lesion at its origin.  This is a  small diameter artery.  The remainder of the mid-distal LAD appear  angiographically normal.   Left circumflex is occluded in its proximal portion.   The right coronary artery is diffusely diseased.  It is nondominant.  Proximally, there is 50% disease that gives off an RV marginal in its mid-  segment.  There is diffuse nonobstructive disease beyond the RV marginal  branch.   Left ventriculogram showed inferolateral akinesis with an estimated left  ventricular ejection fraction of 40%.   ASSESSMENT:  1. A 100% occlusion of the left circumflex.  2. Moderate mid-LAD  disease.  3. Diffuse nonobstructive right coronary artery disease in a nondominant      right coronary artery.  4. Mild segmental left ventricular dysfunction.   DISCUSSION:  The patient's occluded left circumflex angiographically  appeared acute.  I elected to perform PCI on this vessel.  A 6-French CLS  3.5 guiding catheter was used.  Patient was on [place] study and we  followed their protocol for anticoagulation.  ACTs were drawn multiple times  and we had a therapeutic ACT prior to wire insertion into the vessel.  I  initially tried to wire the vessel with a Cougar guidewire, but was unable  to cross the lesion.  I then used a Whisper wire and was able to cross the  lesion with the assistance of an over-the-wire 2.0 x 15 balloon and a long  Whisper guidewire.  The lesion was dilated with the 2.0 x 15  balloon up to  the six atmospheres on multiple occasions throughout the proximal  circumflex.  There was only TIMI 1 flow following balloon dilatation.  I  then changed out to a long Cougar guidewire through the balloon in order to  work with a safer wire.  I re-ballooned the vessel with a 2.0 x 20 balloon  throughout the proximal segment.  With an initial flush after the balloon  dilatation there appeared to be flow, but once I removed the balloon from  the coronary there was very little flow in the vessel.  At this point, I  elected to stent the proximal circumflex, as I suspect that the vessel was  continuing to recoil and there was no antegrade flow.  A 2.5 x 20 mm Taxus  stent was deployed at nine atmospheres.  Following stent deployment, there  was TIMI 3 flow throughout a dominant left circumflex system as well as  there were also two obtuse marginals.  Following stent deployment, a 2.5 mm  noncompliant balloon was used to post dilate the stent.  At the conclusion  of the case there was TIMI 3 flow in both the left circumflex and obtuse  marginal branch.   The patient will be taken to the holding area and then back to his room.  He  will have his sheath pulled when his ACT is within an acceptable range.  He  will require 12 months of dual antiplatelet therapy with aspirin and Plavix.  Will consider an outpatient stress test to assess any LAD territorial  ischemia with a moderate lesion in his mid-LAD.      Veverly Fells. Excell Seltzer, MD  Electronically Signed     MDC/MEDQ  D:  05/01/2006  T:  05/02/2006  Job:  [id number]   cc:   Luis Abed, MD, [facility]

## 2010-12-07 NOTE — Consult Note (Signed)
Jaime [name], LOURO NO.:  [id number]   MEDICAL RECORD NO.:  [id number]          PATIENT TYPE:  INP   LOCATION:  2313                         FACILITY:  [facility]   PHYSICIAN:  Duke Salvia, MD, FACCDATE OF BIRTH:  04/12/1941   DATE OF CONSULTATION:  08/20/2006  DATE OF DISCHARGE:                                 CONSULTATION   Thank you very much for asking [place] to see Mr. Crite in consultation  because of positive cardiac enzymes.   Mr. Steffler is a 70 year old African American male with a complicated  past medical history including:  1. Multiple sclerosis, which precludes him been ambulatory where he is      unable to sit up on his own.  2. Recent pneumonia, now with identification of a pulmonary mass      thought to be likely carcinoma, currently undergoing pathological      evaluation.  3. Diabetes.  4. Hypertension.   He also has a history of cardiac disease, presenting with atypical chest  pain and indigestion in November 2007.  This was astutely picked up by  Dr. Drue Novel, who referred him for further evaluation.  He ruled in for a  myocardial infarction with a peak troponin of 24, underwent  catheterization, was found to have a totaled circumflex, which was  stented.  He had a 50% LAD lesion past the first diagonal, an 80% second  diagonal lesion, and the RCA was diffusely diseased but not dominant.  Medical therapy was recommended.   The patient was then recently hospitalized with pneumonia, came back in  a few days later with abrupt onset of shortness of breath.  Evaluation  has now been consistent with recurring infiltrates and a lung mass been  evaluated as noted above.   The night before last he became acutely delirious.  Explanation is not  is not still clear.  His heart rate was up, his blood pressure was up.  He was complaining of abdominal pain at that time.  Someone cleverly  checked cardiac enzymes, and his troponin came back last night and  0.35,  up from normal on presentation.  Given his past cardiac history, we were  consulted.   I should also note that his ejection fraction was about 40-45%.  As  noted previously, he was having chest pain, although atypical, at the  time of his presentation for his myocardial infarction; he thought this  was indigestion.  He has had no similar discomfort since.   His cardiac-related risk factors include:  1. Diabetes.  2. Dyslipidemia.  3. Hypertension.   His past medical history addition to the above includes urinary tract  infection.   MEDICATIONS ON ADMISSION:  1. Amantadine 100 mg t.i.d.  2. Glyburide.  3. Metformin 50/500 mg b.i.d.  4. Aspirin 325 mg.  5. Lipitor 80 mg.  6. Coreg 6.25  mg.  7. Terazosin.  8. Plavix.  9. Benazepril.  10.Finasteride.   I should note that his medications were changed from p.o. to IV during  his disorientation.   SOCIAL HISTORY:  He is married.  He  is disabled.  He used to work at  [employer] as well as in [employer] for many years.  He stopped smoking in  September 2007.  He does not use alcohol or recreational drugs.   REVIEW OF SYSTEMS:  Noncontributory.   PHYSICAL EXAMINATION:  GENERAL:  On examination, he is a cachectic-  appearing, older African American male appearing his stated age of 21.  VITAL SIGNS:  His blood pressure is 99/62 with a pulse of 80,  respirations are 22.  HEENT:  No icterus or xanthoma.  NECK:  The neck veins were flat.  Carotids were brisk bilaterally  without bruits.  BACK:  Not examined.  LUNGS:  Clear laterally.  HEART:  Heart sounds were regular without murmurs or gallops.  ABDOMEN:  Soft with active bowel sounds.  EXTREMITIES:  Femoral pulses were 2+.  Distal pulses were intact.  There  was no clubbing, cyanosis or edema.  NEUROLOGIC:  Not assessed, but he says he is not able to raise his right  leg.  He is not able to sit up.   Electrocardiogram dated 27 January demonstrates sinus rhythm at 67  with  intervals of 0.19/0.08/0.40.  There is poor R-wave progression in V1 to  V3, minor ST-segment depressions in the inferolateral leads.  Repeat ECG  has not been obtained.   Echocardiogram dated 27 January demonstrates ejection fraction of about  45% with hypokinesis of the inferior posteriors wall and mild mitral  regurgitation.   IMPRESSION:  1. Non-ST elevation myocardial infarction with recurrent positive      enzymes with a peak troponin of 0.35 (repeat pending) in the      context of:      a.     Known coronary artery disease with.  (1)  Prior drug-eluting stent to his circumflex in October 2007.  B.  Residual LAD, RCA and D-2 disease on medical therapy.  C.  Ejection fraction 40%.  1. 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  2. Multiple sclerosis.  3. Diabetes.  4. Hypertension.  5. Dyslipidemia.  6. Anemia.   DISCUSSION:  Mr. Boakye has known significant residual disease following  his stenting.  In the setting of his acute shortness of breath, delirium  and high-catecholamine state, he ended up bumping his enzymes.  It is  not clear whether this is a progressive occlusive event or more likely a  high-demand event.  Myoview scanning may help to clarify this.   For now I would:  1. Recheck enzymes.  2. Consider the role of Myoview scanning, especially if the patient      needs further invasive evaluation and/or therapy.  3. Await the pathology.  4. Continue his low-molecular-weight heparin for present as well as      his Plavix.   Thank you for the consultation.      Duke Salvia, MD, [facility]  Electronically Signed     SCK/MEDQ  D:  08/20/2006  T:  08/20/2006  Job:  [id number]   cc:   Willow Ora, MD

## 2010-12-07 NOTE — Assessment & Plan Note (Signed)
[facility]                            CARDIOLOGY OFFICE NOTE   NAME:Spindel, KIEGAN MACARAEG                     MRN:          [id number]  DATE:07/08/2006                            DOB:          07-Dec-1940    Mr. Sackrider is doing well.  He did have an episode of exacerbation of his  multiple sclerosis treated in the emergency room.  This has stabilized.  He did not have chest pain or shortness of breath, but he is stable at  this point.  We have been adjusting his meds.  He does have mild LV  dysfunction.  He has not tolerated an ACE-inhibitor well in the past,  but it is time to give him a retrial at this point.   PAST MEDICAL HISTORY:   ALLERGIES:  DITROPAN.   MEDICATIONS:  Amantadine, Glucovance, Proscar , Lipitor, Plavix, aspirin  and Coreg 6.25 b.i.d.   OTHER MEDICAL PROBLEMS:  See the extensive list on my note of  06/05/2006.   REVIEW OF SYSTEMS:  He feels well today.  He is not having any major  problems.   His review of systems otherwise is negative.   PHYSICAL EXAMINATION:  Blood pressure today is increased at 140/95 with  a heart rate of 83.  The patient is oriented to person, time and place.  Affect is normal.  His wife is in the room at the time of the evaluation.  He has no xanthelasma.  There is normal extraocular motion.  There are no carotid bruits.  There is no jugular venous distention.  CARDIAC:  Reveals an S1 with an S2.  There are no clicks or significant  murmurs.  ABDOMEN:  Is soft.  He has no peripheral edema.   No labs are done today.   PROBLEMS:  Are listed on my note of June 05, 2006.  1. Hypertension.  2. Ischemic cardiomyopathy with ejection fraction in the 40% to 45%      range.  We will now add low dose of an ACE-inhibitor.  No other      testing planned at this time.  I do have in mind that he has an 80%      residual left anterior descending lesion.   I will see him back for followup.     Luis Abed, MD, [facility]  Electronically Signed    JDK/MedQ  DD: 07/08/2006  DT: 07/08/2006  Job #: [id number]   cc:   Willow Ora, MD  Melvyn Novas, M.D.

## 2010-12-07 NOTE — Assessment & Plan Note (Signed)
[facility]                              CARDIOLOGY OFFICE NOTE   NAME:[place], HAZEM KENNER                     MRN:          [id number]  DATE:06/05/2006                            DOB:          November 04, 1940    Mr. Tauzin is here post hospitalization.  Mr. Garrido is doing well.  He did  have a non-ST elevation MI during his hospitalization on April 30, 2006.  He had been feeling fatigued.  This was probably from his multiple  sclerosis; however, he had an episode of significant indigestion with  shortness of breath and diaphoresis, and he eventually came to the hospital.  He had some EKG changes.  Dr. Drue Novel assessed the situation and was concerned  about it, and we decided to take the patient to [facility].  He did have  significant CK and troponin elevations, with his troponin as high as 24.  He  was placed on heparin and appropriate medications, and a catheterization was  done.  He had a totally occluded proximal left circumflex, as well as 80%  mid LAD and 50% right coronary.  Ejection fraction was 40%.  The circumflex  was treated with a TAXUS stent.  The LAD was not approached.  I will follow  him over time and consider an adenosine Myoview to determine the approach to  his LAD.  I have decided not to do that as of today.  The patient's blood  pressure was low.  We were not able to use an ACE inhibitor.  We were able  to use some Coreg.  His 2-D echocardiogram showed an ejection fraction of 40-  45%.  Ultimately, he stabilized, and he was ready to go home.  He was  discharged from [facility] on May 05, 2006, and he is now seen back in  followup.  He has not had any recurring chest pain.   PAST MEDICAL HISTORY:  1. Amantidine.  2. Glucovance.  3. Proscar.  4. Lipitor 80.  5. Plavix 75.  6. Aspirin 325.  7. Coreg 6.25 b.i.d. to be increased to 12.5 b.i.d.   OTHER MEDICAL PROBLEMS:  See the list below.   REVIEW OF SYSTEMS:  He has had some  fatigue.  He has not had any return of  his indigestion.  He has not had any syncope or presyncope.  His review of  systems otherwise is negative.   PHYSICAL EXAMINATION:  VITAL SIGNS:  The patient has a blood pressure of  116/70 today.  His pulse is 92.  GENERAL:  The patient is oriented to person, time and place.  Affect is  normal.  He is well-developed and well-nourished.  MUSCULOSKELETAL:  He does have musculoskeletal changes of multiple  sclerosis, in that he is in his wheelchair.  He does have some scoliosis.  HEENT:  Eyes reveal no xanthelasma.  He has normal extraocular motion.  NECK:  There are no significant carotid bruits.  There is no jugular venous  distention.  His mouth is normal.  LUNGS:  Clear.  Respiratory effort is not labored.  CARDIAC:  An S1  with an S2.  He has no clicks or significant murmurs.  ABDOMEN:  Soft.  He has no masses or bruits.  He has no peripheral edema.  There are 2+ distal pulses.   ELECTROCARDIOGRAM:  EKG revealed slight J point elevation in lead V2.  He  has had this before.  Also, there is T wave inversion in lead V6.  There are  no acute changes.   PROBLEMS:  1. Multiple sclerosis.  This is being followed carefully by the neurology      team.  2. Community acquired pneumonia while in the hospital that was treated.  3. Hyperlipidemia on medication.  4. Hypertension, treated.  5. Cigarette use, which we continue to encourage him to be sure that he      has stopped completely.  6. Diabetes.  7. Ischemic cardiomyopathy with an ejection fraction in the 40-45% range.      The patient's Coreg will be titrated upwards.  I will push this before      retrying an ACE inhibitor in his case.  He did not tolerate the ACE      well before.  8. Coronary disease, post non-ST elevation myocardial infarction with      occlusion of his circumflex.  This was opened with a TAXUS stent.  He      does have 80% residual left anterior descending.  We will  consider a      Myoview, but I plan not to do that at this time, as he is not having      significant symptoms.  Will follow him very carefully and titrate up      his Coreg, and I will see him back in 3-4 weeks.     Luis Abed, MD, [facility]  Electronically Signed    JDK/MedQ  DD: 06/05/2006  DT: 06/05/2006  Job #: [id number]   cc:   Melvyn Novas, M.D.  Willow Ora, MD

## 2010-12-07 NOTE — Discharge Summary (Signed)
Jaime [name], Jaime [name]              ACCOUNT NO.:  [id number]   MEDICAL RECORD NO.:  [id number]          PATIENT TYPE:  INP   LOCATION:  5706                         FACILITY:  [facility]   PHYSICIAN:  Valerie A. Felicity Coyer, MDDATE OF BIRTH:  1940-11-08   DATE OF ADMISSION:  08/17/2006  DATE OF DISCHARGE:  09/04/2006                               DISCHARGE SUMMARY   DISCHARGE DIAGNOSES:  1. Right apical lung mass, rule out inflammation versus malignancy.  2. Non-Q-wave myocardial infarction January 27.  3. Enterococcus urinary tract infection per urine culture August 09, 2006.  4. Diabetes type 2.  5. Dyslipidemia.  6. Chronic systolic heart failure.  7. Urinary retention status post evaluation by Dr. Brunilda Payor of Urology.   HISTORY OF PRESENT ILLNESS:  Jaime [name] is a 70 year old male who was  admitted on August 17, 2006 with chief complaint of shortness of  breath.  He had been discharged on January 21st, after a 1 week  admission for right upper lobe, and right lower lobe pneumonia.  and was  given.  He noted that, since that discharge, he had had some mild  shortness of breath and a nonproductive cough.  He was treated with  BiPAP ventilation in the emergency department, secondary to acute  respiratory distress.  Chest x-ray performed in the ED noted new  infiltrates, and a BNP was elevated with a value of 868.  He was noted  to have a normal white count.  He was admitted for further evaluation  and treatment.   PAST MEDICAL HISTORY:  1. Multiple slowed sclerosis.  2. Diabetes type 2.  3. Coronary artery disease.  4. Ischemic cardiomyopathy.  5. Dyslipidemia.  6. Cellulitis of the testicle followed by Urology.   COURSE OF HOSPITALIZATION:  PROBLEM:  1. Right apical lung mass. January 30th chest x-ray noted a right      apical mass.  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  CT of the pelvis was also performed at that time and      which noted 2 hepatic lesions, an enlarged prostate, and a tiny      amount of free pleural fluid.  He was also noted to have bilateral      pleural effusions and some cholelithiasis.  Based on CT, it was      felt that metastatic disease could not be excluded, in regards to      the hepatic lesions.  Follow-up PET scan did note a solid right      hepatic lobe lesion, which recommended continued follow-up.  There      was also a question of some bilateral sphenoid sinusitis on PET      scan. The patient is currently asymptomatic and no sinus      complaints.  PET scan performed on September 04, 2006 noted  high      activity at the area of the right apical mass, which was compatible      with malignancy.  During this hospitalization, the patient was seen      in consultation by interventional radiology.  Initially, a needle      biopsy was planned.  However, this was delayed, as the patient was      on aspirin and Plavix.  The aspirin and Plavix were held.  On the      date during which the patient was supposed to receive his biopsy,      he develop a fever, and the biopsy was postponed.  Follow-up CT      chest performed on August 27, 2006 noticed a decrease in the mass-      like area of consolidation in the right medial lung apex.  As a      result of this finding, the biopsy was cancelled.  The patient was      started back on his Plavix and aspirin, and it was decided to      follow with serial CAT scan.   The patient was also seen in consultation by cardiology during this  admission, and he did require Lasix temporarily.  However, this was  discontinued as the patient became euvolemic.  As well, his Coreg was  discontinued due to hypotension and bradycardia.  He is currently  hemodynamically stable.   1. PET scan performed on February 14 did note a right  apical mass,      with question of compatibility with malignancy.  The patient was      seen in consultation during this admission by infectious disease      and was treated with vancomycin and Rocephin for a total of 10      days. This was then changed to Zosyn, which has now been      discontinued.  The plan, in regards to the patient's pulmonary      mass, is to monitor him off of antibiotics.  Assuming he remains      afebrile and asymptomatic, the plan will be to repeat a CAT scan in      1 month as an outpatient.  However, should the patient experience      clinical decline or fever over 101, this will need to be repeated      sooner. Depending on findings of repeat CAT scan, it can later be      determined whether or not the patient will need to be rescheduled      for a biopsy.  2. Urinary retention.  A Foley was placed, as the patient was noted to      be in urinary retention.  The patient was seen in consultation by      Dr. Ezzie Dural.  A Coude catheter was placed.  We will send the      patient home with a Foley catheter and ask home health to follow      the patient for catheter care, and the patient has been instructed      to contact Urology for a follow-up appointment.  3. Diabetes type 2.  The patient was noted to have some mild      hypoglycemia on 14 units of Lantus.  He will be discharged home on      10 units of Lantus, as well as his Glyburide prior to admission.  4. Non-Q-wave MI.  The  patient was noted to have elevated cardiac      enzymes and was seen by [facility].  He was managed      medically.   MEDICATIONS AT TIME OF DISCHARGE:  1. Mucinex 1200 mg p.o. b.i.d.  2. Amantadine 100 mg p.o. t.i.d.  3. Lipitor 80 mg p.o. daily.  4. Proscar 5 mg p.o. daily.  5. Protonix 40 mg p.o. b.i.d.  6. Multivitamin 1 tablet p.o. daily.  7. Glucovance 5/500 p.o. b.i.d.  8. Aspirin 325 mg p.o. daily.  9. Plavix 75 mg p.o. daily. 10.Lantus 10 units subcu daily.    PERTINENT LABORATORIES AT TIME OF DISCHARGE:  1. Blood cultures February 5, no growth to date x2; CBC:  Hemoglobin      11.7, hematocrit 35.6, white blood cell count 4.9, platelets      204,000.  The patient underwent a thoracentesis performed on      August 20, 2006.  Culture was negative.  Pathology noted reactive      mesothelial cells.  2. Enterococcus UTI.  The patient was treated with vancomycin for      Enterococcus UTI during this admission.   FOLLOW UP:  The patient is instructed to follow up with Dr. Willow Ora on  Tuesday, February 19 at 12:45 p.m.  He is also instructed follow up with  Dr. Ezzie Dural and contact his office for an appointment.  He is to call  Dr. Drue Novel, should he develop fever over 101, worsening cough, weakness or  shortness of breath.      Sandford Craze, NP      Raenette Rover. Felicity Coyer, MD  Electronically Signed    MO/MEDQ  D:  09/04/2006  T:  09/04/2006  Job:  [id number]   cc:   Willow Ora, MD

## 2010-12-07 NOTE — Assessment & Plan Note (Signed)
[facility]                             PULMONARY OFFICE NOTE   NAME:SPINKSMoriah, Loughry                     MRN:          [id number]  DATE:10/21/2006                            DOB:          1940/08/06    SUBJECTIVE:  Mr. Baranowski comes in today for followup of his abnormal  right upper lobe process that was noted during a hospitalization in  January.  He also had a pleural effusion at that time.  Patient  underwent thoracentesis where no malignant cells were found.  He was  scheduled to undergo a fine needle aspiration of the right upper lobe  process, however, began to develop fever and had other issues which put  this on hold.  Ultimately, the patient had a PET scan done which did  show increased metabolic activity in the malignant range in the right  upper lobe area.  Through discussions that I was not a part of the  decision was made to follow this with serial CT scans.  The patient had  a scan the end of February of this year, which showed that there was  some decrease in the size of the lesion in question.  Patient now comes  in for further followup.  He is continuing to get stronger after his  recent hospitalization.  He is putting on weight.  He is having no  significant cough, mucus, hemoptysis, or pleuritic chest pain.   PHYSICAL EXAMINATION:  BP is 132/90.  Pulse 83.  Temperature is 98.1.  Weight is unknown since he was in a wheelchair.  His O2 saturation was  98% on room air.   IMPRESSION:  Right upper lobe process which by CAT scan appears to be  more inflammatory than mass like, however, I am somewhat concerned about  the increased metabolic activity noted on the PET.  His CT scan the end  of February did indeed show the lesion to be a little bit smaller.  I  have had a long conversation with the patient and his wife about the  advantages and disadvantages of watching this serially versus doing a  fine needle aspiration.  After our long  conversation, the patient feels  that he would like to do serial CTs with the next being in approximately  3 months.  He understands this may indeed be a malignant process.   PLAN:  The patient will be scheduled for a followup CT the end of May.  He will then follow up after.     Barbaraann Share, MD,FCCP  Electronically Signed    [facility]/MedQ  DD: 10/21/2006  DT: 10/21/2006  Job #: [id number]   cc:   Willow Ora, MD

## 2010-12-27 ENCOUNTER — Other Ambulatory Visit: Payer: Self-pay | Admitting: Internal Medicine

## 2010-12-27 MED ORDER — SIMVASTATIN 40 MG PO TABS: 40.0000 mg | ORAL_TABLET | Freq: Every day | ORAL | Status: DC

## 2010-12-27 NOTE — Telephone Encounter (Signed)
Sent in

## 2011-01-29 ENCOUNTER — Encounter: Payer: Self-pay | Admitting: Internal Medicine

## 2011-01-29 ENCOUNTER — Ambulatory Visit (HOSPITAL_BASED_OUTPATIENT_CLINIC_OR_DEPARTMENT_OTHER)
Admission: RE | Admit: 2011-01-29 | Discharge: 2011-01-29 | Disposition: A | Discharge status: Home / Self Care | Payer: Medicare Other | Source: Ambulatory Visit | Attending: Internal Medicine | Admitting: Internal Medicine

## 2011-01-29 ENCOUNTER — Ambulatory Visit (INDEPENDENT_AMBULATORY_CARE_PROVIDER_SITE_OTHER): Payer: Medicare Other | Admitting: Internal Medicine

## 2011-01-29 ENCOUNTER — Other Ambulatory Visit: Payer: Self-pay | Admitting: Internal Medicine

## 2011-01-29 ENCOUNTER — Other Ambulatory Visit: Payer: Self-pay

## 2011-01-29 DIAGNOSIS — E785 Hyperlipidemia, unspecified: Secondary | ICD-10-CM

## 2011-01-29 DIAGNOSIS — R52 Pain, unspecified: Secondary | ICD-10-CM

## 2011-01-29 DIAGNOSIS — M4120 Other idiopathic scoliosis, site unspecified: Secondary | ICD-10-CM | POA: Insufficient documentation

## 2011-01-29 DIAGNOSIS — M25519 Pain in unspecified shoulder: Secondary | ICD-10-CM

## 2011-01-29 DIAGNOSIS — R0789 Other chest pain: Secondary | ICD-10-CM | POA: Insufficient documentation

## 2011-01-29 LAB — LIPID PANEL
Cholesterol: 128 mg/dL (ref 0–200)
LDL Cholesterol: 78 mg/dL (ref 0–99)
Triglycerides: 40 mg/dL (ref 0.0–149.0)

## 2011-01-29 MED ORDER — CYCLOBENZAPRINE HCL 10 MG PO TABS: 10.0000 mg | ORAL_TABLET | Freq: Two times a day (BID) | ORAL | Status: DC | PRN

## 2011-01-29 NOTE — Progress Notes (Signed)
  Subjective:    Patient ID: Jaime [name], male    DOB: 1940/08/23, 70 y.o.   MRN: [id number]  HPI ROV Here w/ wife Still has issues w/ L shoulder pain: went to ortho, received a local shot , it did help temporarily. Since then the pain has extended from  the shoulder to the whole L arm, L chest, L pelvis; sx seem mechanical  -------> increase w/ certain motions   Past Medical History  Diagnosis Date  . Hyperlipidemia   . Cardiomyopathy, ischemic     EF 45% per ECHO 2008  . Pulmonary hypertension     moderate ECHO Jan 2008  . CAD (coronary artery disease)     non-STEMI, 2007.Marland Kitchenoccluded circumflex.. Taxus stent placed...residual 80% LAD...50% RCA  . CHF (congestive heart failure)   . Systolic heart failure   . Multiple sclerosis   . Diabetes mellitus   . Lung nodule     resolved 11-2006 CT Chest  . Tobacco abuse   . Increased prostate specific antigen (PSA) velocity   . Urinary retention     dx ~ 2-12, like from MS, now with a catheter, saw urology    Past Surgical History  Procedure Date  . Hernia repair     (R)    Review of Systems No fever, cough, SOB DM managed by Dr Talmage Nap, insulin adjusted since the last OV an now having much less frecuent low sugars Good compliance w/ chol meds Denies pressure ulcers at the buttocks    Objective:   Physical Exam  Constitutional: He is oriented to person, place, and time. He appears well-developed.       Wheelchair bound  Cardiovascular: Normal rate, regular rhythm and normal heart sounds.   No murmur heard. Pulmonary/Chest: Effort normal and breath sounds normal. No respiratory distress. He has no wheezes. He has no rales.       Tender at the L side of the chest, no rash noted   Musculoskeletal: He exhibits no edema.       Severe scoliosis, L shoulder ROM only slt decrease, L shoulder slt tender at the [facility] joint  Neurological: He is alert and oriented to person, place, and time.  Psychiatric:       Good spirits           Assessment & Plan:

## 2011-01-29 NOTE — Assessment & Plan Note (Signed)
Due for labs

## 2011-01-29 NOTE — Assessment & Plan Note (Signed)
Getting worse, pain now involves the chest; likely d/t scoliosis Plan: CXR , referral to physiatrist (Dr Doroteo Bradford), flexeril, tylenol, see instructions

## 2011-01-29 NOTE — Patient Instructions (Signed)
Get a XR For pain:  Tylenol 500 mg 2 tabs every 6h ours as needed Flexeril as needed  (watch for drowsiness)

## 2011-01-31 ENCOUNTER — Other Ambulatory Visit: Payer: Self-pay | Admitting: Internal Medicine

## 2011-01-31 MED ORDER — CYCLOBENZAPRINE HCL 10 MG PO TABS: 10.0000 mg | ORAL_TABLET | Freq: Two times a day (BID) | ORAL | Status: AC | PRN

## 2011-02-01 ENCOUNTER — Telehealth: Payer: Self-pay | Admitting: *Deleted

## 2011-02-01 NOTE — Telephone Encounter (Signed)
Message left for patient to return my call.  

## 2011-02-01 NOTE — Telephone Encounter (Signed)
Message copied by Leanne Lovely on Fri Feb 01, 2011  2:53 PM ------      Message from: Willow Ora E      Created: Fri Feb 01, 2011  1:07 PM       Advise patient, cholesterol under excellent control, continue with same medicines

## 2011-02-04 NOTE — Telephone Encounter (Signed)
Pts wife is aware 

## 2011-02-08 ENCOUNTER — Encounter: Payer: Self-pay | Admitting: Internal Medicine

## 2011-03-11 ENCOUNTER — Ambulatory Visit: Payer: Medicare Other | Admitting: Physical Medicine & Rehabilitation

## 2011-03-11 ENCOUNTER — Encounter: Payer: Medicare Other | Attending: Physical Medicine & Rehabilitation

## 2011-03-11 DIAGNOSIS — G35 Multiple sclerosis: Secondary | ICD-10-CM | POA: Insufficient documentation

## 2011-03-11 DIAGNOSIS — M542 Cervicalgia: Secondary | ICD-10-CM | POA: Insufficient documentation

## 2011-03-11 DIAGNOSIS — E119 Type 2 diabetes mellitus without complications: Secondary | ICD-10-CM | POA: Insufficient documentation

## 2011-03-11 DIAGNOSIS — M4120 Other idiopathic scoliosis, site unspecified: Secondary | ICD-10-CM

## 2011-03-11 DIAGNOSIS — M25529 Pain in unspecified elbow: Secondary | ICD-10-CM

## 2011-03-11 DIAGNOSIS — M25519 Pain in unspecified shoulder: Secondary | ICD-10-CM | POA: Insufficient documentation

## 2011-03-11 DIAGNOSIS — M549 Dorsalgia, unspecified: Secondary | ICD-10-CM | POA: Insufficient documentation

## 2011-03-11 NOTE — Progress Notes (Signed)
REASON FOR VISIT:  Left shoulder pain.  HISTORY:  A 70 year old male with multiple sclerosis, wheelchair bound since 1996 who has a primary complaint of left shoulder pain that also goes into the neck as well as the back area.  He has had these complaints for greater than 6 months.  His left shoulder has had x-rays demonstrating a narrow distance between the humeral head in the acromial arch.  He has had an injection by Orthopedics x1 and this was helpful. He has more intermittent pain and constant pain at the current time, but certain things tend to make it worse such as lifting his arm up.  He has had additional x-rays including chest 2-views demonstrating some scoliosis in the thoracic spine convex to the right.  He has had no fever or chills.  He has been treated with Flexeril and Tylenol which give him some pretty decent relief.  He has not undergone physical therapy.  OTHER PAST MEDICAL HISTORY:  Diabetes.  His neck pain is really not severe and he any denies any numbness or tingling in the left upper extremity.  His average pain is 5 as noted above, intermittent.  He does note some problems when he is transferring, his wife does need to help him transfer into the bed but he can get from wheelchair to a chair independently.  His MS is mainly causing weakness on the right side of the body.  Other review of systems, bladder problems, tremor, trouble walking.  Other physicians include Dr. Melvyn Novas, from Neurology, Dr. Talmage Nap, Endocrinology; Dr. Ave Filter, Orthopedics.  SOCIAL HISTORY:  Married, lives with his wife, smokes one-pack per week.  FAMILY HISTORY:  Heart disease, hypertension.  PHYSICAL EXAMINATION:  VITAL SIGNS:  Blood pressure 104/68, pulse 96, respirations 18, and O2 sat 97% on room air. MUSCULOSKELETAL:  Neck range of motion is full without pain.  He has a severe right convex thoracic scoliosis with some lumbar compensatory curve.  He has no tenderness  around the neck, upper back, or low back area.  He has 4/5 strength in the right upper and right lower extremity and 5/5 left side.  His sensation is normal in bilateral upper and lower extremities.  He does have increased tone with hyperactive reflexes right side greater than left side.  He has positive drop-arm test on the left side.  His range of motion is 0 to 120 degrees of abduction on the left and forward flexion to 120 on the left.  Internal and external rotation to 45.  No tenderness in the subacromial area or over the trapezius.  No tenderness over the [facility] joint.  No tenderness with crossed arm abduction maneuver.  IMPRESSION:  Left shoulder pain appears to be rotator cuff syndrome, plus minus rotator cuff tear with suspect this supraspinatus given his x- rays as well as symptoms.  PLAN:  We will assess with ultrasound, see if he indeed has a tear or whether this is a mainly at tendinosis, would be able to better design physical therapy program based on this.  In the meantime, he uses Tylenol and Flexeril which seem to be adequately addressing his pain, he does not require repeat injection at the current time.  We discussed the treatment plan with the patient and he agrees. I will see him back in about 3 weeks for the ultrasound.     Erick Colace, M.D. Electronically Signed    AEK/MedQ D:  03/11/2011 10:45:36  T:  03/11/2011 15:23:52  Job #:  [id number]  cc:   Willow Ora, MD [phone] [address] [place], [place] [address]

## 2011-03-14 ENCOUNTER — Other Ambulatory Visit: Payer: Self-pay | Admitting: Internal Medicine

## 2011-04-01 ENCOUNTER — Encounter: Payer: Medicare Other | Attending: Physical Medicine & Rehabilitation

## 2011-04-01 ENCOUNTER — Ambulatory Visit: Payer: Medicare Other | Admitting: Physical Medicine & Rehabilitation

## 2011-04-01 DIAGNOSIS — M7520 Bicipital tendinitis, unspecified shoulder: Secondary | ICD-10-CM

## 2011-04-01 DIAGNOSIS — M549 Dorsalgia, unspecified: Secondary | ICD-10-CM | POA: Insufficient documentation

## 2011-04-01 DIAGNOSIS — M25519 Pain in unspecified shoulder: Secondary | ICD-10-CM | POA: Insufficient documentation

## 2011-04-01 DIAGNOSIS — M542 Cervicalgia: Secondary | ICD-10-CM | POA: Insufficient documentation

## 2011-04-01 DIAGNOSIS — E119 Type 2 diabetes mellitus without complications: Secondary | ICD-10-CM | POA: Insufficient documentation

## 2011-04-01 DIAGNOSIS — G35 Multiple sclerosis: Secondary | ICD-10-CM | POA: Insufficient documentation

## 2011-04-01 NOTE — Procedures (Signed)
[name], [name]              ACCOUNT NO.:  [id number]  MEDICAL RECORD NO.:  [id number]           PATIENT TYPE:  O  LOCATION:  [facility]PC                          FACILITY:  [facility]  PHYSICIAN:  Erick Colace, M.D.DATE OF BIRTH:  1941-06-06  DATE OF PROCEDURE:  04/01/2011 DATE OF DISCHARGE:                              OPERATIVE REPORT  PROCEDURE:  Left shoulder ultrasound.  INDICATION:  Left shoulder pain with overhead activity.  Pain is rated as 5-7/10.  The patient was placed in a seated position.  A 12 Hz transducer utilized for the procedure.  The left bicipital tendon was identified in the groove.  There was some cortical irregularity around the groove. There was external and internal rotation of the arm, there was subluxation of the bicipital tendon out of the groove.  Bicipital tendon cross-section was 2.9 mm.  A supraspinatus and thickness was 6 mm.  No evidence of tear on short axis or long axis views.  Subscapularis was measured at 3.9 mm.  There was no evidence of discontinuity or tear. Infraspinatus was examined both in long axis and short axis views, no evidence of tear, no evidence of fluid.  The [facility] joint was examined in long axis views.  There were some cortical irregularity at the joint space.  No evidence of excessive fluid in the joint space.  Dynamic views of the supraspinatus with arm in neutral to abduction showed no obvious signs of impingement, but the patient did have pain during this maneuver.  IMPRESSION: 1. Subluxing bicipital tendon corresponding with anterior shoulder     pain. 2. Cortical irregularity of humeral head as well as bicipital groove. 3. No of rotator cuff tear.  Discussed results with the patient.     Erick Colace, M.D. Electronically Signed    AEK/MEDQ  D:  04/01/2011 12:49:28  T:  04/01/2011 15:22:52  Job:  [id number]  cc:   Willow Ora, MD [phone] [address] [place], [place] [address]

## 2011-04-29 ENCOUNTER — Encounter: Payer: Medicare Other | Attending: Physical Medicine & Rehabilitation

## 2011-04-29 ENCOUNTER — Ambulatory Visit: Payer: Medicare Other | Admitting: Physical Medicine & Rehabilitation

## 2011-04-29 DIAGNOSIS — E119 Type 2 diabetes mellitus without complications: Secondary | ICD-10-CM | POA: Insufficient documentation

## 2011-04-29 DIAGNOSIS — M542 Cervicalgia: Secondary | ICD-10-CM | POA: Insufficient documentation

## 2011-04-29 DIAGNOSIS — M25519 Pain in unspecified shoulder: Secondary | ICD-10-CM | POA: Insufficient documentation

## 2011-04-29 DIAGNOSIS — G35 Multiple sclerosis: Secondary | ICD-10-CM

## 2011-04-29 DIAGNOSIS — M549 Dorsalgia, unspecified: Secondary | ICD-10-CM | POA: Insufficient documentation

## 2011-04-29 DIAGNOSIS — M4120 Other idiopathic scoliosis, site unspecified: Secondary | ICD-10-CM

## 2011-04-29 NOTE — Assessment & Plan Note (Signed)
REASON FOR VISIT:  Shoulder pain check.  HISTORY:  A 70 year old male with multiple sclerosis.  He had ultrasound examination of the left shoulder demonstrating left shoulder bioccipital tendonitis.  He was prescribed Voltaren gel and he follows up today.  He has had very good relief even with one time a day application.  He has had no other new problem in the interval time.  He is quite satisfied.  His pain is 0-2/10 range.  He continues to need assist with dressing, bathing, toileting, meal prep, household duties and shopping. He is able to dress upper body.  REVIEW OF SYSTEMS:  Also bowel and bladder control problems, trouble walking, confusion related to his MS.  SOCIAL HISTORY:  Lives with his wife who accompanies with him today.  He smokes a pack per week, we have discussed cessation.  PHYSICAL EXAMINATION:  VITAL SIGNS:  Blood pressure 128/82, pulse 80, respirations 16, and O2 sat 96% on room air. GENERAL:  No acute distress.  Mood and affect appropriate. MUSCULOSKELETAL:  His shoulder has no tenderness to palpation.  Shoulder range of motion is full without pain.  He has no pain with restricted biceps flexion.  He has normal strength in the left upper extremity as well as right extremity.  IMPRESSION: 1. Multiple sclerosis with paraparesis.  His functional status has     improved somewhat since his shoulder pain has improved. 2. Left shoulder tenosynovitis. 3. We discussed starting some physical therapy, he really does not     feel like doing this.  He is happy with how things are going at     current time, he is able to trans himself.  He has been able to     manage his shoulder just with one time a day with the Voltaren gel.  PLAN:  I will see him back in 3 months.  If he has any exacerbation of his pain, not relieved by his Voltaren gel, he should first of all increase to q.i.d. and if this fails, we will schedule him for biceps injection on ultrasound  guidance.     Erick Colace, M.D. Electronically Signed    AEK/MedQ D:  04/29/2011 11:54:21  T:  04/29/2011 17:21:40  Job #:  [id number]  cc:   Willow Ora, MD [phone] [address] [place], [place] [address]

## 2011-05-11 ENCOUNTER — Emergency Department (HOSPITAL_COMMUNITY)
Admission: EM | Admit: 2011-05-11 | Discharge: 2011-05-11 | Disposition: A | Discharge status: Home / Self Care | Payer: Medicare Other | Attending: Emergency Medicine | Admitting: Emergency Medicine

## 2011-05-11 DIAGNOSIS — I252 Old myocardial infarction: Secondary | ICD-10-CM | POA: Insufficient documentation

## 2011-05-11 DIAGNOSIS — Z79899 Other long term (current) drug therapy: Secondary | ICD-10-CM | POA: Insufficient documentation

## 2011-05-11 DIAGNOSIS — E119 Type 2 diabetes mellitus without complications: Secondary | ICD-10-CM | POA: Insufficient documentation

## 2011-05-11 DIAGNOSIS — G35 Multiple sclerosis: Secondary | ICD-10-CM | POA: Insufficient documentation

## 2011-05-11 DIAGNOSIS — F17200 Nicotine dependence, unspecified, uncomplicated: Secondary | ICD-10-CM | POA: Insufficient documentation

## 2011-05-11 DIAGNOSIS — R350 Frequency of micturition: Secondary | ICD-10-CM | POA: Insufficient documentation

## 2011-05-11 DIAGNOSIS — R339 Retention of urine, unspecified: Secondary | ICD-10-CM | POA: Insufficient documentation

## 2011-05-11 DIAGNOSIS — R300 Dysuria: Secondary | ICD-10-CM | POA: Insufficient documentation

## 2011-05-11 LAB — BASIC METABOLIC PANEL
BUN: 14 mg/dL (ref 6–23)
Chloride: 106 mEq/L (ref 96–112)
Glucose, Bld: 171 mg/dL — ABNORMAL HIGH (ref 70–99)
Potassium: 3.8 mEq/L (ref 3.5–5.1)

## 2011-05-11 LAB — CBC
Hemoglobin: 16.5 g/dL (ref 13.0–17.0)
MCH: 27.6 pg (ref 26.0–34.0)
MCV: 79.2 fL (ref 78.0–100.0)
RBC: 5.97 MIL/uL — ABNORMAL HIGH (ref 4.22–5.81)

## 2011-05-11 LAB — URINALYSIS, ROUTINE W REFLEX MICROSCOPIC
Bilirubin Urine: NEGATIVE
Glucose, UA: NEGATIVE mg/dL
Ketones, ur: NEGATIVE mg/dL
pH: 5.5 (ref 5.0–8.0)

## 2011-05-11 LAB — DIFFERENTIAL
Eosinophils Absolute: 0.1 10*3/uL (ref 0.0–0.7)
Lymphs Abs: 1.3 10*3/uL (ref 0.7–4.0)
Monocytes Relative: 9 % (ref 3–12)
Neutro Abs: 4.3 10*3/uL (ref 1.7–7.7)
Neutrophils Relative %: 70 % (ref 43–77)

## 2011-05-12 LAB — URINE CULTURE
Colony Count: 4000
Culture  Setup Time: 201210202018

## 2011-05-24 ENCOUNTER — Emergency Department (HOSPITAL_COMMUNITY)
Admission: EM | Admit: 2011-05-24 | Discharge: 2011-05-24 | Disposition: A | Discharge status: Home / Self Care | Payer: Medicare Other | Attending: Emergency Medicine | Admitting: Emergency Medicine

## 2011-05-24 DIAGNOSIS — E119 Type 2 diabetes mellitus without complications: Secondary | ICD-10-CM | POA: Insufficient documentation

## 2011-05-24 DIAGNOSIS — N400 Enlarged prostate without lower urinary tract symptoms: Secondary | ICD-10-CM | POA: Insufficient documentation

## 2011-05-24 DIAGNOSIS — Z794 Long term (current) use of insulin: Secondary | ICD-10-CM | POA: Insufficient documentation

## 2011-05-24 DIAGNOSIS — I252 Old myocardial infarction: Secondary | ICD-10-CM | POA: Insufficient documentation

## 2011-05-24 DIAGNOSIS — G35 Multiple sclerosis: Secondary | ICD-10-CM | POA: Insufficient documentation

## 2011-05-24 DIAGNOSIS — R109 Unspecified abdominal pain: Secondary | ICD-10-CM | POA: Insufficient documentation

## 2011-05-24 DIAGNOSIS — R339 Retention of urine, unspecified: Secondary | ICD-10-CM | POA: Insufficient documentation

## 2011-05-24 LAB — URINALYSIS, ROUTINE W REFLEX MICROSCOPIC
Bilirubin Urine: NEGATIVE
Glucose, UA: NEGATIVE mg/dL
Ketones, ur: NEGATIVE mg/dL
Leukocytes, UA: NEGATIVE
Nitrite: NEGATIVE
Protein, ur: NEGATIVE mg/dL

## 2011-05-24 LAB — DIFFERENTIAL
Eosinophils Absolute: 0.1 10*3/uL (ref 0.0–0.7)
Eosinophils Relative: 2 % (ref 0–5)
Lymphs Abs: 1 10*3/uL (ref 0.7–4.0)
Monocytes Absolute: 0.4 10*3/uL (ref 0.1–1.0)

## 2011-05-24 LAB — BASIC METABOLIC PANEL
BUN: 16 mg/dL (ref 6–23)
CO2: 21 mEq/L (ref 19–32)
Creatinine, Ser: 1.13 mg/dL (ref 0.50–1.35)
GFR calc Af Amer: 75 mL/min — ABNORMAL LOW (ref >90)
GFR calc non Af Amer: 64 mL/min — ABNORMAL LOW (ref >90)
Glucose, Bld: 174 mg/dL — ABNORMAL HIGH (ref 70–99)
Potassium: 4.5 mEq/L (ref 3.5–5.1)

## 2011-05-24 LAB — CBC
MCH: 26.7 pg (ref 26.0–34.0)
MCHC: 33.6 g/dL (ref 30.0–36.0)
MCV: 79.6 fL (ref 78.0–100.0)
Platelets: 137 10*3/uL — ABNORMAL LOW (ref 150–400)
RDW: 16.2 % — ABNORMAL HIGH (ref 11.5–15.5)
WBC: 4.9 10*3/uL (ref 4.0–10.5)

## 2011-05-26 LAB — URINE CULTURE: Culture  Setup Time: 201211021404

## 2011-05-27 NOTE — ED Notes (Signed)
+   Urine Chart sent to EDP office for review. 

## 2011-06-02 ENCOUNTER — Encounter: Payer: Self-pay | Admitting: Cardiology

## 2011-06-05 ENCOUNTER — Ambulatory Visit (INDEPENDENT_AMBULATORY_CARE_PROVIDER_SITE_OTHER): Payer: Medicare Other | Admitting: Cardiology

## 2011-06-05 ENCOUNTER — Encounter: Payer: Self-pay | Admitting: Cardiology

## 2011-06-05 ENCOUNTER — Telehealth (HOSPITAL_COMMUNITY): Payer: Self-pay | Admitting: Emergency Medicine

## 2011-06-05 DIAGNOSIS — I2510 Atherosclerotic heart disease of native coronary artery without angina pectoris: Secondary | ICD-10-CM

## 2011-06-05 DIAGNOSIS — F17200 Nicotine dependence, unspecified, uncomplicated: Secondary | ICD-10-CM

## 2011-06-05 DIAGNOSIS — I509 Heart failure, unspecified: Secondary | ICD-10-CM

## 2011-06-05 DIAGNOSIS — I779 Disorder of arteries and arterioles, unspecified: Secondary | ICD-10-CM | POA: Insufficient documentation

## 2011-06-05 NOTE — Assessment & Plan Note (Signed)
He is not having any clinical CHF at this time.  No change in therapy.

## 2011-06-05 NOTE — ED Notes (Signed)
Copy of  Labs faxed to  Dr Brunilda Payor.

## 2011-06-05 NOTE — Patient Instructions (Signed)
Your physician recommends that you schedule a follow-up appointment in: 1 year with Dr. Katz  

## 2011-06-05 NOTE — Assessment & Plan Note (Addendum)
The patient does continue to smoke.  Most recently he is using electronic cigarettes.  He is cutting down and he and his wife both say that he hopes to quit soon. I counseled him and encouraged him to stop

## 2011-06-05 NOTE — Progress Notes (Signed)
HPI Patient is here to followup coronary disease.  I saw him last November, 2011.  He has been hospitalized since then related to urinary tract problems.  I reviewed these records.  I have also reviewed all of my old records and carefully palpated the new electronic medical record.  The patient has known coronary disease.  He received a stent to a totally occluded circumflex in 2007.  He has an ejection fraction of 45% with inferior and posterior hypokinesis.  He's not had any chest pain.  He has no signs of clinical heart failure at this time.  No Known Allergies  Current Outpatient Prescriptions  Medication Sig Dispense Refill  . amantadine (SYMMETREL) 100 MG capsule Take 100 mg by mouth as directed. By urologist       . B-D ULTRAFINE III SHORT PEN 31G X 8 MM MISC Inject 12 Units as directed.      . finasteride (PROSCAR) 5 MG tablet Take 5 mg by mouth daily.        Marland Kitchen HUMALOG MIX 75/25 KWIKPEN (75-25) 100 UNIT/ML SUSP Inject 12 Units as directed 2 (two) times daily with a meal.       . simvastatin (ZOCOR) 40 MG tablet TAKE 1 TABLET AT BEDTIME  90 tablet  1  . terazosin (HYTRIN) 5 MG capsule Take 5 mg by mouth at bedtime.          History   Social History  . Marital Status: Married    Spouse Name: N/A    Number of Children: 2  . Years of Education: N/A   Occupational History  . disable    Social History Main Topics  . Smoking status: Current Some Day Smoker  . Smokeless tobacco: Not on file   Comment: "cut down, 1 pack per month"   . Alcohol Use: No  . Drug Use: Not on file  . Sexually Active: Not on file   Other Topics Concern  . Not on file   Social History Narrative   Lives w/ wife    Family History  Problem Relation Age of Onset  . Hypertension    . Heart attack Mother 59  . Colon cancer Neg Hx   . Prostate cancer Neg Hx     Past Medical History  Diagnosis Date  . Hyperlipidemia   . Cardiomyopathy, ischemic     EF 45% per ECHO 2008  . Pulmonary hypertension      moderate ECHO Jan 2008  . CAD (coronary artery disease)     non-STEMI, 2007.Marland Kitchenoccluded circumflex.. Taxus stent placed...residual 80% LAD...50% RCA  . CHF (congestive heart failure)   . Systolic heart failure   . Multiple sclerosis   . Diabetes mellitus   . Lung nodule     resolved 11-2006 CT Chest  . Tobacco abuse   . Increased prostate specific antigen (PSA) velocity   . Urinary retention     dx ~ 2-12, like from MS, now with a catheter, saw urology  . Ejection fraction     E. EF 45%, echo, January, 2008, severe hypokinesis of the inferior and posterior walls of the base and mid ventricle.  . Cellulitis   . Pre-syncope     Near syncope, ER visit, October, 2011    Past Surgical History  Procedure Date  . Hernia repair     (R)    ROS    Patient denies fever, chills, headache, sweats, rash, change in vision, change in hearing, chest pain, cough, nausea vomiting, .  He is not having any urinary symptoms at this time.  All other systems are reviewed and are negative.  He has weakness related to his multiple sclerosis.  PHYSICAL EXAM   Patient is oriented to person time and place.  Affect is normal.  He is in a wheelchair.  He is here with his wife today.  There is no jugular venous distention.  Lungs are clear.  Respiratory effort is not labored.  Cardiac exam reveals S1 and S2.  There are no clicks or significant murmurs.  The abdomen is soft.  There is no peripheral edema.  He has musculoskeletal issues related to his multiple sclerosis.  There are no skin rashes.  Filed Vitals:   06/05/11 1137  BP: 135/91  Pulse: 78  Resp: 18  Height: 5\' 11"  (1.803 m)  Weight: 145 lb (65.772 kg)    EKG is done today and reviewed by me.  He has old poor anterior R-wave progression.  There is sinus rhythm and diffuse nonspecific ST-T wave changes.  There is no significant change.  ASSESSMENT & PLAN

## 2011-06-05 NOTE — Assessment & Plan Note (Signed)
Coronary disease is stable. No change in therapy. 

## 2011-07-26 ENCOUNTER — Encounter (HOSPITAL_BASED_OUTPATIENT_CLINIC_OR_DEPARTMENT_OTHER): Payer: Self-pay | Admitting: *Deleted

## 2011-07-26 ENCOUNTER — Other Ambulatory Visit: Payer: Self-pay | Admitting: Urology

## 2011-07-29 ENCOUNTER — Encounter (HOSPITAL_BASED_OUTPATIENT_CLINIC_OR_DEPARTMENT_OTHER): Payer: Self-pay | Admitting: *Deleted

## 2011-07-29 NOTE — Progress Notes (Signed)
Talked w/ chastity at [facility] urology.  Order clarification received.  istat cancelled.  Cmet,cbc, urine to be performed.

## 2011-07-29 NOTE — H&P (Signed)
History of Present Illness     Jaime [name] has an indwelling Foley catheter for acute urinary retention.  He failed several voiding trials.  Urodynamics studies showed hypersensitive bladder with voluntary contraction.  He has been on Hytrin and Finasteride.  Cystoscopy showed trilobar prostatic hypertrophy.  I discussed treatment options with him and his wife.  They are: TURP, open prostatectomy, cooled thermotherapy.  The risks benefits of each option were discussed in detail.  They include: inability to urinate after the procedure, hemorrhage, infection, urinary incontinence.  They understand and would prefer TURP.  He has a history of multiple sclerosis and gets around in a wheelchair.   Past Medical History Problems  1. History of  Acute Myocardial Infarction V12.59 2. History of  Acute Urinary Retention 788.20 3. History of  Coronary Artery Disease V12.59 4. History of  Diabetes Mellitus 250.00 5. History of  Hypercholesterolemia 272.0 6. History of  Hypertension 401.9  Surgical History Problems  1. History of  Inguinal Hernia Repair  Current Meds 1. Amantadine HCl 100 MG Oral Tablet; Therapy: (Recorded:27Jan2009) to 2. Aspirin 325 MG Oral Tablet; Therapy: (Recorded:27Jan2009) to 3. CVS Insulin Syringe 30G X 1/2" 0.5 ML Miscellaneous; Therapy: 21Jan2008 to 4. Finasteride 5 MG Oral Tablet; TAKE 1 TABLET BY MOUTH   EVERY DAY; Therapy: 23Mar2011 to  (Evaluate:03Jun2013)  Requested for: 11Jun2012; Last Rx:08Jun2012 5. HumaLOG Mix 75/25 KwikPen (75-25) 100 UNIT/ML Subcutaneous Suspension; Therapy:  09Mar2012 to 6. NitroQuick 0.4 MG SUBL; Therapy: 15Oct2007 to 7. Phenazopyridine HCl 200 MG Oral Tablet; TAKE 1 TABLET EVERY 6 TO 8 HOURS AS NEEDED;  Therapy: 28Aug2012 to (Evaluate:04Nov2012)  Requested for: 25Oct2012; Last Rx:25Oct2012 8. Simvastatin 40 MG Oral Tablet; Therapy: 08Jun2012 to 9. Sulfamethoxazole-TMP DS 800-160 MG Oral Tablet; TAKE 1 TABLET TWICE DAILY; Therapy:  25Oct2012 to  (Evaluate:08Nov2012)  Requested for: 25Oct2012; Last Rx:25Oct2012 10. Terazosin HCl 5 MG Oral Capsule; TAKE 1 CAPSULE DAILY; Therapy: 23Aug2010 to   (Evaluate:10Jun2013)  Requested for: 15Jun2012; Last Rx:15Jun2012 11. Voltaren 1 % Transdermal Gel; Therapy: 10Sep2012 to  Allergies Medication  1. No Known Drug Allergies  Family History Problems  1. Family history of  Family Health Status Number Of Children 2 daughters  Social History Problems  1. Caffeine Use 2. Family history of  Death In The Family Father 25 3. Family history of  Death In The Family Mother 42 4. Marital History - Currently Married 5. Occupation: diabled 6. Tobacco Use V15.82 1 pack a day for 50 years Denied  7. Alcohol Use  Review of Systems  Complete review of system is negative except as noted in the HPI.     Physical Exam Constitutional: Well nourished and well developed . No acute distress.  ENT:. The ears and nose are normal in appearance.  Neck: The appearance of the neck is normal and no neck mass is present.  Pulmonary: No respiratory distress and normal respiratory rhythm and effort.  Cardiovascular: Heart rate and rhythm are normal . No peripheral edema.  Abdomen: The abdomen is soft and nontender. No masses are palpated. No CVA tenderness. No hernias are palpable. No hepatosplenomegaly noted.  Rectal: Rectal exam demonstrates normal sphincter tone. Estimated prostate size is 3+. The prostate has no nodularity.  Genitourinary: Examination of the penis demonstrates a normal meatus.  Has an indwelling Foley catheter that is draining clear urine.  Lymphatics: The femoral and inguinal nodes are not enlarged or tender.  Skin: Normal skin turgor, no visible rash and no visible skin lesions.  He can not move his lower extremities secondary to multiple sclerosis.    Assessment Assessed  1. Acute Urinary Retention 788.20 2. Benign Prostatic Hypertrophy With Urinary Obstruction 600.01 3.  Obstructive Uropathy 599.60  Plan Acute Urinary Retention (788.20)  1. Follow-up [place] Procedure Office  Follow-up  Requested for: 19Dec2012   Leave Foley indwelling.  Schedule TURP.  Patient prefers to do it after the first of the year.   Signatures Electronically signed by : Su Grand, M.D.; Jul 02 2011  5:02PM

## 2011-07-29 NOTE — Progress Notes (Addendum)
SPOKE W/ PT WIFE. NPO AFTER MN. ARRIVES AT 0715. PT W/C BOUND BUT CAN TRANSFER SELF, FEED AND DRINK HIMSELF. NEEDS ASSIST W/ GROOMING AND DRESSING. WIFE WILL STAY W/ HIM OVERNIGHT. NEEDS ISTAT. CURRENT EKG AND CXR IN EPIC AND W/ CHART. REVIEWED [facility] GUIDELINES, WILL BRING MEDS.  REVIEWED CHART W/ DR ROSE, OK TO PROCEED SINCE PT IS OWER FOR OBSERVATION.

## 2011-07-29 NOTE — Progress Notes (Signed)
Attempt to reach patient to come in at 0645 for ordered lab work  Unable to speak with patient left a message on the answering machine and left phone to call us if any questions  Darl Pikes Celia Gibbons rn,bsn

## 2011-07-30 ENCOUNTER — Ambulatory Visit: Payer: Medicare Other | Admitting: Physical Medicine & Rehabilitation

## 2011-07-30 ENCOUNTER — Encounter (HOSPITAL_BASED_OUTPATIENT_CLINIC_OR_DEPARTMENT_OTHER): Payer: Self-pay | Admitting: Anesthesiology

## 2011-07-30 ENCOUNTER — Encounter (HOSPITAL_BASED_OUTPATIENT_CLINIC_OR_DEPARTMENT_OTHER)
Admission: RE | Disposition: A | Discharge status: Home / Self Care | Payer: Self-pay | Source: Ambulatory Visit | Attending: Urology

## 2011-07-30 ENCOUNTER — Ambulatory Visit (HOSPITAL_BASED_OUTPATIENT_CLINIC_OR_DEPARTMENT_OTHER)
Admission: RE | Admit: 2011-07-30 | Discharge: 2011-07-31 | Disposition: A | Discharge status: Home / Self Care | Payer: Medicare Other | Source: Ambulatory Visit | Attending: Urology | Admitting: Urology

## 2011-07-30 ENCOUNTER — Other Ambulatory Visit: Payer: Self-pay | Admitting: Urology

## 2011-07-30 ENCOUNTER — Encounter (HOSPITAL_BASED_OUTPATIENT_CLINIC_OR_DEPARTMENT_OTHER): Payer: Self-pay | Admitting: *Deleted

## 2011-07-30 ENCOUNTER — Ambulatory Visit: Payer: Medicare Other

## 2011-07-30 ENCOUNTER — Ambulatory Visit (HOSPITAL_BASED_OUTPATIENT_CLINIC_OR_DEPARTMENT_OTHER): Payer: Medicare Other | Admitting: Anesthesiology

## 2011-07-30 DIAGNOSIS — N139 Obstructive and reflux uropathy, unspecified: Secondary | ICD-10-CM | POA: Insufficient documentation

## 2011-07-30 DIAGNOSIS — E119 Type 2 diabetes mellitus without complications: Secondary | ICD-10-CM | POA: Insufficient documentation

## 2011-07-30 DIAGNOSIS — Z794 Long term (current) use of insulin: Secondary | ICD-10-CM | POA: Insufficient documentation

## 2011-07-30 DIAGNOSIS — N411 Chronic prostatitis: Secondary | ICD-10-CM | POA: Diagnosis not present

## 2011-07-30 DIAGNOSIS — Z79899 Other long term (current) drug therapy: Secondary | ICD-10-CM | POA: Insufficient documentation

## 2011-07-30 DIAGNOSIS — I10 Essential (primary) hypertension: Secondary | ICD-10-CM | POA: Insufficient documentation

## 2011-07-30 DIAGNOSIS — N401 Enlarged prostate with lower urinary tract symptoms: Secondary | ICD-10-CM | POA: Diagnosis not present

## 2011-07-30 DIAGNOSIS — Z7982 Long term (current) use of aspirin: Secondary | ICD-10-CM | POA: Diagnosis not present

## 2011-07-30 DIAGNOSIS — E7800 Pure hypercholesterolemia, unspecified: Secondary | ICD-10-CM | POA: Insufficient documentation

## 2011-07-30 DIAGNOSIS — R339 Retention of urine, unspecified: Secondary | ICD-10-CM | POA: Diagnosis not present

## 2011-07-30 DIAGNOSIS — R338 Other retention of urine: Secondary | ICD-10-CM | POA: Diagnosis not present

## 2011-07-30 DIAGNOSIS — I252 Old myocardial infarction: Secondary | ICD-10-CM | POA: Diagnosis not present

## 2011-07-30 DIAGNOSIS — I2510 Atherosclerotic heart disease of native coronary artery without angina pectoris: Secondary | ICD-10-CM | POA: Insufficient documentation

## 2011-07-30 DIAGNOSIS — N138 Other obstructive and reflux uropathy: Secondary | ICD-10-CM | POA: Insufficient documentation

## 2011-07-30 DIAGNOSIS — N4289 Other specified disorders of prostate: Secondary | ICD-10-CM | POA: Diagnosis not present

## 2011-07-30 HISTORY — PX: CYSTOSCOPY: SHX5120

## 2011-07-30 HISTORY — DX: Other fatigue: R53.83

## 2011-07-30 HISTORY — DX: Male erectile dysfunction, unspecified: N52.9

## 2011-07-30 HISTORY — DX: Essential (primary) hypertension: I10

## 2011-07-30 HISTORY — DX: Personal history of colonic polyps: Z86.010

## 2011-07-30 HISTORY — DX: Personal history of other diseases of the respiratory system: Z87.09

## 2011-07-30 HISTORY — DX: Insomnia, unspecified: G47.00

## 2011-07-30 HISTORY — PX: TRANSURETHRAL RESECTION OF PROSTATE: SHX73

## 2011-07-30 HISTORY — DX: Other secondary scoliosis, site unspecified: M41.50

## 2011-07-30 HISTORY — DX: Personal history of colon polyps, unspecified: Z86.0100

## 2011-07-30 LAB — COMPREHENSIVE METABOLIC PANEL
ALT: 27 U/L (ref 0–53)
AST: 17 U/L (ref 0–37)
Albumin: 3.7 g/dL (ref 3.5–5.2)
Alkaline Phosphatase: 63 U/L (ref 39–117)
Calcium: 9.2 mg/dL (ref 8.4–10.5)
Glucose, Bld: 102 mg/dL — ABNORMAL HIGH (ref 70–99)
Potassium: 3.7 mEq/L (ref 3.5–5.1)
Sodium: 138 mEq/L (ref 135–145)
Total Protein: 7.3 g/dL (ref 6.0–8.3)

## 2011-07-30 LAB — GLUCOSE, CAPILLARY
Glucose-Capillary: 106 mg/dL — ABNORMAL HIGH (ref 70–99)
Glucose-Capillary: 126 mg/dL — ABNORMAL HIGH (ref 70–99)
Glucose-Capillary: 192 mg/dL — ABNORMAL HIGH (ref 70–99)

## 2011-07-30 LAB — URINALYSIS, ROUTINE W REFLEX MICROSCOPIC
Bilirubin Urine: NEGATIVE
Glucose, UA: NEGATIVE mg/dL
Ketones, ur: NEGATIVE mg/dL
Specific Gravity, Urine: 1.03 (ref 1.005–1.030)
pH: 6.5 (ref 5.0–8.0)

## 2011-07-30 LAB — CBC
Hemoglobin: 15.1 g/dL (ref 13.0–17.0)
MCHC: 33.5 g/dL (ref 30.0–36.0)
Platelets: 140 10*3/uL — ABNORMAL LOW (ref 150–400)
RDW: 16.3 % — ABNORMAL HIGH (ref 11.5–15.5)

## 2011-07-30 LAB — URINE MICROSCOPIC-ADD ON

## 2011-07-30 SURGERY — CYSTOSCOPY
Anesthesia: General | Site: Prostate | Wound class: Clean Contaminated

## 2011-07-30 MED ORDER — AMANTADINE HCL 100 MG PO CAPS: 200.0000 mg | ORAL_CAPSULE | Freq: Two times a day (BID) | ORAL | Status: DC

## 2011-07-30 MED ORDER — PROPOFOL 10 MG/ML IV EMUL
INTRAVENOUS | Status: DC | PRN
  Administered 2011-07-30: 150 mg via INTRAVENOUS
  Administered 2011-07-30: 25 mg via INTRAVENOUS

## 2011-07-30 MED ORDER — ONDANSETRON HCL 4 MG/2ML IJ SOLN: 4.0000 mg | INTRAMUSCULAR | Status: DC | PRN

## 2011-07-30 MED ORDER — INSULIN LISPRO PROT & LISPRO (75-25 MIX) 100 UNIT/ML ~~LOC~~ SUSP
12.0000 [IU] | Freq: Two times a day (BID) | SUBCUTANEOUS | Status: DC
  Administered 2011-07-31: 12 [IU] via SUBCUTANEOUS

## 2011-07-30 MED ORDER — BELLADONNA ALKALOIDS-OPIUM 16.2-60 MG RE SUPP
1.0000 | Freq: Four times a day (QID) | RECTAL | Status: DC | PRN
  Administered 2011-07-30: 1 via RECTAL

## 2011-07-30 MED ORDER — MEPERIDINE HCL 25 MG/ML IJ SOLN: 6.2500 mg | INTRAMUSCULAR | Status: DC | PRN

## 2011-07-30 MED ORDER — FENTANYL CITRATE 0.05 MG/ML IJ SOLN: 25.0000 ug | INTRAMUSCULAR | Status: DC | PRN

## 2011-07-30 MED ORDER — FUROSEMIDE 10 MG/ML IJ SOLN
INTRAMUSCULAR | Status: DC | PRN
  Administered 2011-07-30: 20 mg via INTRAMUSCULAR

## 2011-07-30 MED ORDER — CIPROFLOXACIN IN D5W 400 MG/200ML IV SOLN
400.0000 mg | INTRAVENOUS | Status: AC
  Administered 2011-07-30: 400 mg via INTRAVENOUS

## 2011-07-30 MED ORDER — PROMETHAZINE HCL 25 MG/ML IJ SOLN
6.2500 mg | INTRAMUSCULAR | Status: AC | PRN
  Administered 2011-07-30 (×2): 6.25 mg via INTRAVENOUS

## 2011-07-30 MED ORDER — INSULIN PEN NEEDLE 31G X 8 MM MISC: 12.0000 [IU] | Freq: Two times a day (BID) | Status: DC

## 2011-07-30 MED ORDER — 0.9 % SODIUM CHLORIDE (POUR BTL) OPTIME
TOPICAL | Status: DC | PRN
  Administered 2011-07-30: 1500 mL

## 2011-07-30 MED ORDER — SODIUM CHLORIDE 0.9 % IR SOLN
Status: DC | PRN
  Administered 2011-07-30: 24000 mL

## 2011-07-30 MED ORDER — CIPROFLOXACIN IN D5W 400 MG/200ML IV SOLN
400.0000 mg | Freq: Two times a day (BID) | INTRAVENOUS | Status: AC
  Administered 2011-07-30: 400 mg via INTRAVENOUS

## 2011-07-30 MED ORDER — EPHEDRINE SULFATE 50 MG/ML IJ SOLN
INTRAMUSCULAR | Status: DC | PRN
  Administered 2011-07-30: 10 mg via INTRAVENOUS

## 2011-07-30 MED ORDER — DEXTROSE-NACL 5-0.45 % IV SOLN: INTRAVENOUS | Status: DC

## 2011-07-30 MED ORDER — FENTANYL CITRATE 0.05 MG/ML IJ SOLN
INTRAMUSCULAR | Status: DC | PRN
  Administered 2011-07-30: 50 ug via INTRAVENOUS
  Administered 2011-07-30 (×6): 25 ug via INTRAVENOUS

## 2011-07-30 MED ORDER — BELLADONNA ALKALOIDS-OPIUM 16.2-60 MG RE SUPP
RECTAL | Status: DC | PRN
  Administered 2011-07-30: 1 via RECTAL

## 2011-07-30 MED ORDER — STERILE WATER FOR IRRIGATION IR SOLN
Status: DC | PRN
  Administered 2011-07-30: 500 mL

## 2011-07-30 MED ORDER — LACTATED RINGERS IV SOLN
INTRAVENOUS | Status: DC
  Administered 2011-07-31: 02:00:00 via INTRAVENOUS

## 2011-07-30 MED ORDER — ACETAMINOPHEN 325 MG PO TABS
650.0000 mg | ORAL_TABLET | ORAL | Status: DC | PRN
  Administered 2011-07-30: 650 mg via ORAL

## 2011-07-30 MED ORDER — SIMVASTATIN 40 MG PO TABS: 40.0000 mg | ORAL_TABLET | Freq: Every day | ORAL | Status: DC

## 2011-07-30 MED ORDER — ZOLPIDEM TARTRATE 5 MG PO TABS: 5.0000 mg | ORAL_TABLET | Freq: Every evening | ORAL | Status: DC | PRN

## 2011-07-30 MED ORDER — LIDOCAINE HCL (CARDIAC) 20 MG/ML IV SOLN
INTRAVENOUS | Status: DC | PRN
  Administered 2011-07-30: 80 mg via INTRAVENOUS

## 2011-07-30 MED ORDER — LACTATED RINGERS IV SOLN
INTRAVENOUS | Status: DC
  Administered 2011-07-30: 08:00:00 via INTRAVENOUS
  Administered 2011-07-30: 100 mL/h via INTRAVENOUS

## 2011-07-30 SURGICAL SUPPLY — 42 items
BAG DRAIN URO-CYSTO SKYTR STRL (DRAIN) ×3 IMPLANT
BAG URINE DRAINAGE (UROLOGICAL SUPPLIES) ×3 IMPLANT
BAG URINE LEG 19OZ MD ST LTX (BAG) IMPLANT
CANISTER SUCT LVC 12 LTR MEDI- (MISCELLANEOUS) ×6 IMPLANT
CATH FOLEY 2WAY SLVR  5CC 20FR (CATHETERS)
CATH FOLEY 2WAY SLVR  5CC 22FR (CATHETERS)
CATH FOLEY 2WAY SLVR 5CC 20FR (CATHETERS) IMPLANT
CATH FOLEY 2WAY SLVR 5CC 22FR (CATHETERS) IMPLANT
CATH FOLEY 3WAY 30CC 24FR (CATHETERS) ×1
CATH HEMA 3WAY 30CC 24FR COUDE (CATHETERS) IMPLANT
CATH HEMA 3WAY 30CC 24FR RND (CATHETERS) IMPLANT
CATH URTH STD 24FR FL 3W 2 (CATHETERS) ×2 IMPLANT
CLOTH BEACON ORANGE TIMEOUT ST (SAFETY) ×3 IMPLANT
DRAPE CAMERA CLOSED 9X96 (DRAPES) ×3 IMPLANT
ELECT BUTTON HF 24-28F 2 30DE (ELECTRODE) IMPLANT
ELECT LOOP HF 26F 30D .35MM (CUTTING LOOP) IMPLANT
ELECT LOOP MED HF 24F 12D (CUTTING LOOP) IMPLANT
ELECT LOOP MED HF 24F 12D CBL (CLIP) ×3 IMPLANT
ELECT REM PT RETURN 9FT ADLT (ELECTROSURGICAL)
ELECTRODE REM PT RTRN 9FT ADLT (ELECTROSURGICAL) IMPLANT
EVACUATOR MICROVAS BLADDER (UROLOGICAL SUPPLIES) ×3 IMPLANT
GLOVE BIO SURGEON STRL SZ7 (GLOVE) ×3 IMPLANT
GLOVE BIOGEL M 6.5 STRL (GLOVE) ×6 IMPLANT
GLOVE BIOGEL PI IND STRL 6.5 (GLOVE) ×2 IMPLANT
GLOVE BIOGEL PI INDICATOR 6.5 (GLOVE) ×1
GLOVE ECLIPSE 6.0 STRL STRAW (GLOVE) ×3 IMPLANT
GOWN PREVENTION PLUS XLARGE (GOWN DISPOSABLE) ×3 IMPLANT
GOWN STRL NON-REIN LRG LVL3 (GOWN DISPOSABLE) ×6 IMPLANT
HOLDER FOLEY CATH W/STRAP (MISCELLANEOUS) ×3 IMPLANT
IV NS IRRIG 3000ML ARTHROMATIC (IV SOLUTION) ×30 IMPLANT
KIT ASPIRATION TUBING (SET/KITS/TRAYS/PACK) IMPLANT
LOOP CUTTING 24FR OLYMPUS (CUTTING LOOP) IMPLANT
NDL SAFETY ECLIPSE 18X1.5 (NEEDLE) IMPLANT
NEEDLE HYPO 18GX1.5 SHARP (NEEDLE)
NEEDLE HYPO 22GX1.5 SAFETY (NEEDLE) IMPLANT
NS IRRIG 500ML POUR BTL (IV SOLUTION) ×9 IMPLANT
PACK CYSTOSCOPY (CUSTOM PROCEDURE TRAY) ×3 IMPLANT
PLUG CATH AND CAP STER (CATHETERS) ×3 IMPLANT
SYR 20CC LL (SYRINGE) ×3 IMPLANT
SYR 30ML LL (SYRINGE) IMPLANT
WATER STERILE IRR 3000ML UROMA (IV SOLUTION) IMPLANT
WATER STERILE IRR 500ML POUR (IV SOLUTION) ×3 IMPLANT

## 2011-07-30 NOTE — Op Note (Signed)
Jaime [name] is a 71 y.o.   07/30/2011  Preop diagnosis: Acute urinary retention, BPH.  Postop diagnosis: Same.  Procedure done: Cystoscopy and TURP  Anesthesia: General  Date of procedure: 07/30/2011  Surgeon: Wendie Simmer. Olanna Percifield  Indication: Patient is a 71 years old male who went into urinary retention. He failed several voiding trials. Urodynamic studies showed hypersensitive bladder and voluntary contraction. He has been on Terazosin and finasteride. Cystoscopy showed trilobar prostatic hypertrophy. Treatment options were discussed with him and his wife: TURP, open prostatectomy, cooled thermotherapy. He elected to proceed with TURP and he is scheduled today for procedure.  Procedure: The patient was identified by his wrist band and proper timeout was taken.  Under general anesthesia he was prepped and draped and placed in the dorsolithotomy position. A panendoscope was inserted in the bladder. The anterior urethra is normal. He has trilobar prostatic hypertrophy. There is obstruction of the bladder neck. The bladder is moderately trabeculated with cellules. There is no stone or tumor in the bladder. The ureteral orifices are in normal position and shape. The cystoscope was then removed. The urethra was dilated with Von Buren sounds up to 28 [place]. Then  a 26 Gyrus resectoscope was inserted in the bladder. Resection of the prostate gland was done between the 7 and the 10:00 position and between the 2 and 5:00 position using the bladder neck and the veru montanum as landmarks. Then the resection was completed between the 10:00 and the 2:00 position and between the 5 and 7:00 position using the same landmarks. Hemostasis was secured with electrocautery. The prostatic chips way up irrigated out of the bladder with the Microvasive evacuator. There was minimal bleeding at the end of the procedure. The resectoscope was then removed. A #24 French Foley catheter was then inserted in the bladder and left  to straight drainage.  EBL: 150 cc.  The patient tolerated the procedure well and left the OR in satisfactory condition to postanesthesia care unit.

## 2011-07-30 NOTE — Anesthesia Procedure Notes (Addendum)
Procedure Name: LMA Insertion Date/Time: 07/30/2011 9:08 AM Performed by: Lorrin Jackson Pre-anesthesia Checklist: Patient identified, Emergency Drugs available, Suction available and Patient being monitored Patient Re-evaluated:Patient Re-evaluated prior to inductionOxygen Delivery Method: Circle System Utilized Preoxygenation: Pre-oxygenation with 100% oxygen Intubation Type: IV induction Ventilation: Mask ventilation without difficulty LMA: LMA with gastric port inserted LMA Size: 4.0 Number of attempts: 1 Placement Confirmation: positive ETCO2 Tube secured with: Tape Dental Injury: Teeth and Oropharynx as per pre-operative assessment        Narrative:

## 2011-07-30 NOTE — Progress Notes (Signed)
Dentures returned to patient. 

## 2011-07-30 NOTE — Anesthesia Postprocedure Evaluation (Signed)
  Anesthesia Post-op Note  Patient: Jaime [name]  Procedure(s) Performed:  CYSTOSCOPY; TRANSURETHRAL RESECTION OF THE PROSTATE (TURP)  Patient Location: PACU  Anesthesia Type: General  Level of Consciousness: awake and alert   Airway and Oxygen Therapy: Patient Spontanous Breathing  Post-op Pain: mild  Post-op Assessment: Post-op Vital signs reviewed, Patient's Cardiovascular Status Stable, Respiratory Function Stable, Patent Airway and No signs of Nausea or vomiting  Post-op Vital Signs: stable  Complications: No apparent anesthesia complications

## 2011-07-30 NOTE — Progress Notes (Signed)
Post-Op rounds  T:  97.4   P: 102    BP: 169/104.  R: 18 Alert and oriented.   Tolerates diet well.   Foley draining well.  Urine pinkish. Satisfactory post-op. Plan: Remove Foley in AM.          Discharge home in AM.

## 2011-07-30 NOTE — Transfer of Care (Signed)
Immediate Anesthesia Transfer of Care Note  Patient: Jaime [name]  Procedure(s) Performed:  CYSTOSCOPY; TRANSURETHRAL RESECTION OF THE PROSTATE (TURP)  Patient Location: Patient transported to PACU with oxygen via face mask at 6 Liters / Min  Anesthesia Type: General  Level of Consciousness: awake and alert   Airway & Oxygen Therapy: Patient Spontanous Breathing and Patient connected to face mask oxygen Post-op Assessment: Report given to PACU RN and Post -op Vital signs reviewed and stable  Post vital signs: Reviewed and stable  Complications: No apparent anesthesia complications

## 2011-07-30 NOTE — Anesthesia Preprocedure Evaluation (Addendum)
Anesthesia Evaluation  Patient identified by MRN, date of birth, ID band Patient awake    Reviewed: Allergy & Precautions, H&P , NPO status , Patient's Chart, lab work & pertinent test results  Airway Mallampati: II TM Distance: >3 FB Neck ROM: Full    Dental No notable dental hx. (+) Edentulous Upper, Lower Dentures and Upper Dentures   Pulmonary neg pulmonary ROS, Current Smoker,  clear to auscultation  Pulmonary exam normal       Cardiovascular hypertension, + CAD (s/p stent 2009) and neg cardio ROS Regular Normal    Neuro/Psych Negative Neurological ROS  Negative Psych ROS   GI/Hepatic negative GI ROS, Neg liver ROS,   Endo/Other  Diabetes mellitus-, Insulin Dependent  Renal/GU negative Renal ROS  Genitourinary negative   Musculoskeletal negative musculoskeletal ROS (+)   Abdominal   Peds negative pediatric ROS (+)  Hematology negative hematology ROS (+)   Anesthesia Other Findings   Reproductive/Obstetrics negative OB ROS                           Anesthesia Physical Anesthesia Plan  ASA: III  Anesthesia Plan: General   Post-op Pain Management:    Induction: Intravenous  Airway Management Planned:   Additional Equipment:   Intra-op Plan:   Post-operative Plan: Extubation in OR  Informed Consent: I have reviewed the patients History and Physical, chart, labs and discussed the procedure including the risks, benefits and alternatives for the proposed anesthesia with the patient or authorized representative who has indicated his/her understanding and acceptance.   Dental advisory given  Plan Discussed with: CRNA  Anesthesia Plan Comments:        Anesthesia Quick Evaluation

## 2011-07-31 ENCOUNTER — Encounter (HOSPITAL_BASED_OUTPATIENT_CLINIC_OR_DEPARTMENT_OTHER): Payer: Self-pay | Admitting: Urology

## 2011-07-31 LAB — GLUCOSE, CAPILLARY: Glucose-Capillary: 136 mg/dL — ABNORMAL HIGH (ref 70–99)

## 2011-07-31 MED ORDER — PHENAZOPYRIDINE HCL 200 MG PO TABS: 200.0000 mg | ORAL_TABLET | Freq: Three times a day (TID) | ORAL | Status: AC | PRN

## 2011-07-31 MED ORDER — CIPROFLOXACIN HCL 500 MG PO TABS: 500.0000 mg | ORAL_TABLET | Freq: Two times a day (BID) | ORAL | Status: AC

## 2011-07-31 NOTE — Progress Notes (Signed)
T: 98.2 this AM, was 101.4 last night.    BP: 11264      P: 94     R: 18  Foley removed this morning.  Has not voided yet.   Bladder not distended. Plan: Bladder scan.  If his bladder is distended will reinsert Foley.  Bladder scan shows 45 ml n bladder.          Discharge home today on Cipro 500 mgm BID.          Patient to call if he has difficulty voiding.          Follow-up as outpatient

## 2011-07-31 NOTE — Discharge Summary (Signed)
Mr. Fennell had a TURP on 07/30/2011 for urinary retention. He spiked a temperature to 101.4 last night but it went down this morning to 98.7. His blood pressure is 112/64, pulse 94, respiration 18.  The Foley catheter was removed at 5:00. He has not voided yet. His bladder is not distended. Bladder scan shows a volume of 45 cc in the bladder.  He was then discharged home on Cipro 500 mg twice a day, phenazopyridine 200 mg 3 times a day.  He is instructed to drink lots of fluids and to call the office if he is unable to urinate, has a bleeding or fever.  He is going home on all his home medications. He will be followed in the office.  Discharge diet: 1800-calorie diabetic diet.  Condition on discharge improved.

## 2011-07-31 NOTE — Progress Notes (Signed)
Pt bathed prior to getting dressed.  Pt assisted in getting dressed by wife and nurse.

## 2011-07-31 NOTE — Progress Notes (Signed)
Foley removed per order.  Pt encouraged to force fluids.  callbell within reach.  Urinal within reach.  Pt instructed to call for assistance prn.

## 2011-08-01 ENCOUNTER — Telehealth: Payer: Self-pay | Admitting: Internal Medicine

## 2011-08-01 MED ORDER — SIMVASTATIN 40 MG PO TABS: 40.0000 mg | ORAL_TABLET | Freq: Every day | ORAL | Status: DC

## 2011-08-01 NOTE — Telephone Encounter (Signed)
Please send simvastatin refill to [facility].

## 2011-08-01 NOTE — Telephone Encounter (Signed)
Rx sent 

## 2011-08-13 ENCOUNTER — Telehealth: Payer: Self-pay | Admitting: *Deleted

## 2011-08-13 NOTE — Telephone Encounter (Signed)
Caller states that patient's wife is requesting placement for patient for pain issues, decubitus ulcer on gluteus; Pt has OV on Thursday 01.24.13 but spouse is completely exhausted and would like to have patient admitted to hospital.

## 2011-08-13 NOTE — Telephone Encounter (Signed)
Spoke with the patient wife, he had prostate surgery about 2 weeks ago, he is in a lot of pain was prescribed a secured by urology with not much relief. Wife is afraid that he is developing decubitus ulcer but denies any open sores, discharge or fever. We discussed a ER evaluation subsequent to admission and rehabilitation placement and also send a home health agency to check on him. The patient really likes to stay home consequently we'll arrange a home health agency to go evaluate him; needs skin care, mobility etc.  Please arrange the referral

## 2011-08-15 ENCOUNTER — Ambulatory Visit (INDEPENDENT_AMBULATORY_CARE_PROVIDER_SITE_OTHER): Payer: Medicare Other | Admitting: Internal Medicine

## 2011-08-15 ENCOUNTER — Other Ambulatory Visit: Payer: Self-pay | Admitting: Internal Medicine

## 2011-08-15 VITALS — BP 128/86 | HR 87 | Temp 98.2°F

## 2011-08-15 DIAGNOSIS — N400 Enlarged prostate without lower urinary tract symptoms: Secondary | ICD-10-CM | POA: Diagnosis not present

## 2011-08-15 DIAGNOSIS — E119 Type 2 diabetes mellitus without complications: Secondary | ICD-10-CM | POA: Diagnosis not present

## 2011-08-15 DIAGNOSIS — F17200 Nicotine dependence, unspecified, uncomplicated: Secondary | ICD-10-CM | POA: Diagnosis not present

## 2011-08-15 DIAGNOSIS — E785 Hyperlipidemia, unspecified: Secondary | ICD-10-CM

## 2011-08-15 NOTE — Assessment & Plan Note (Signed)
Well-controlled per last FLP 

## 2011-08-15 NOTE — Assessment & Plan Note (Signed)
S/p TURP, has a lot of dyscomfort, spoke to his urologist and he rrquested a Berniece Pap done rec to discuss further w/ urology

## 2011-08-15 NOTE — Progress Notes (Signed)
  Subjective:    Patient ID: Jaime [name], male    DOB: 1940/12/01, 71 y.o.   MRN: [id number]  HPI Routine office visit, here with his wife. He had a TURP 2 weeks ago, having a lot of discomfort with urination however symptoms have decreased in the last few days. He contacted his urologist and they requested a urine culture.   Past Medical History: MS DM HYPERLIPIDEMIA CARDIOMYOPATHY, ISCHEMIC EF 45%..... echo.. January, 2008... severe hypokinesis inferior and posterior walls at base and mid ventricle Pulmonary hypertension    moderate.... echo.... January, 2008 CAD   non-STEMI.. 2007.Marland Kitchen occluded circumflex... Taxus stent placed.... residual 80% LAD.Marland Kitchen 50% RCA BPH Lung nodule-- resolved per 11-2006 CT chest     Past Surgical History: TURP, Dr Nessi---> 07-30-11  hernia repair  (R)  Social History: Married, children x 2  Current Smoker, has cut down "  1 pack a month" Alcohol use-no     Review of Systems Medication list reviewed, good compliance. Diabetes followup by endocrinology He recently saw cardiology, not reviewed, his is stable. His is still smoking, "very little" on using the "Ecigarrette" . Denies any fever or chills No nausea vomiting.     Objective:   Physical Exam  Constitutional: He is oriented to person, place, and time. He appears well-developed and well-nourished.  Cardiovascular: Normal rate, regular rhythm and normal heart sounds.   No murmur heard. Pulmonary/Chest: Effort normal and breath sounds normal. No respiratory distress. He has no wheezes. He has no rales.  Neurological: He is alert and oriented to person, place, and time.  Psychiatric:       Good spirits       Assessment & Plan:

## 2011-08-15 NOTE — Assessment & Plan Note (Signed)
Counseled

## 2011-08-15 NOTE — Assessment & Plan Note (Signed)
Per Dr Ballan 

## 2011-08-16 NOTE — Telephone Encounter (Signed)
Documents given to [facility] from [facility] on 08/15/11

## 2011-08-16 NOTE — Telephone Encounter (Signed)
Forwarded paperwork [01.24.13] that was begun before office change to TF at [facility] to complete for fax to [facility]-patient was in office also on 01.24.13.

## 2011-08-17 ENCOUNTER — Encounter: Payer: Self-pay | Admitting: Internal Medicine

## 2011-08-19 DIAGNOSIS — R32 Unspecified urinary incontinence: Secondary | ICD-10-CM | POA: Diagnosis not present

## 2011-08-19 DIAGNOSIS — I509 Heart failure, unspecified: Secondary | ICD-10-CM | POA: Diagnosis not present

## 2011-08-19 DIAGNOSIS — E119 Type 2 diabetes mellitus without complications: Secondary | ICD-10-CM | POA: Diagnosis not present

## 2011-08-19 DIAGNOSIS — G35 Multiple sclerosis: Secondary | ICD-10-CM | POA: Diagnosis not present

## 2011-08-19 DIAGNOSIS — M25519 Pain in unspecified shoulder: Secondary | ICD-10-CM | POA: Diagnosis not present

## 2011-08-19 DIAGNOSIS — I428 Other cardiomyopathies: Secondary | ICD-10-CM | POA: Diagnosis not present

## 2011-08-19 LAB — URINE CULTURE: Colony Count: >=100000

## 2011-08-20 DIAGNOSIS — R339 Retention of urine, unspecified: Secondary | ICD-10-CM | POA: Diagnosis not present

## 2011-08-21 ENCOUNTER — Encounter: Payer: Self-pay | Admitting: *Deleted

## 2011-08-22 DIAGNOSIS — M25519 Pain in unspecified shoulder: Secondary | ICD-10-CM | POA: Diagnosis not present

## 2011-08-22 DIAGNOSIS — R32 Unspecified urinary incontinence: Secondary | ICD-10-CM | POA: Diagnosis not present

## 2011-08-22 DIAGNOSIS — I428 Other cardiomyopathies: Secondary | ICD-10-CM | POA: Diagnosis not present

## 2011-08-22 DIAGNOSIS — G35 Multiple sclerosis: Secondary | ICD-10-CM | POA: Diagnosis not present

## 2011-08-22 DIAGNOSIS — I509 Heart failure, unspecified: Secondary | ICD-10-CM | POA: Diagnosis not present

## 2011-08-22 DIAGNOSIS — E119 Type 2 diabetes mellitus without complications: Secondary | ICD-10-CM | POA: Diagnosis not present

## 2011-08-27 ENCOUNTER — Ambulatory Visit: Payer: Medicare Other | Admitting: Physical Medicine & Rehabilitation

## 2011-08-27 DIAGNOSIS — G35 Multiple sclerosis: Secondary | ICD-10-CM | POA: Diagnosis not present

## 2011-08-27 DIAGNOSIS — I509 Heart failure, unspecified: Secondary | ICD-10-CM | POA: Diagnosis not present

## 2011-08-27 DIAGNOSIS — E119 Type 2 diabetes mellitus without complications: Secondary | ICD-10-CM | POA: Diagnosis not present

## 2011-08-27 DIAGNOSIS — R32 Unspecified urinary incontinence: Secondary | ICD-10-CM | POA: Diagnosis not present

## 2011-08-27 DIAGNOSIS — M25519 Pain in unspecified shoulder: Secondary | ICD-10-CM | POA: Diagnosis not present

## 2011-08-27 DIAGNOSIS — I428 Other cardiomyopathies: Secondary | ICD-10-CM | POA: Diagnosis not present

## 2011-09-03 DIAGNOSIS — R32 Unspecified urinary incontinence: Secondary | ICD-10-CM | POA: Diagnosis not present

## 2011-09-03 DIAGNOSIS — G35 Multiple sclerosis: Secondary | ICD-10-CM | POA: Diagnosis not present

## 2011-09-03 DIAGNOSIS — E119 Type 2 diabetes mellitus without complications: Secondary | ICD-10-CM | POA: Diagnosis not present

## 2011-09-03 DIAGNOSIS — I509 Heart failure, unspecified: Secondary | ICD-10-CM | POA: Diagnosis not present

## 2011-09-03 DIAGNOSIS — I428 Other cardiomyopathies: Secondary | ICD-10-CM | POA: Diagnosis not present

## 2011-09-03 DIAGNOSIS — M25519 Pain in unspecified shoulder: Secondary | ICD-10-CM | POA: Diagnosis not present

## 2011-09-05 DIAGNOSIS — R32 Unspecified urinary incontinence: Secondary | ICD-10-CM | POA: Diagnosis not present

## 2011-09-05 DIAGNOSIS — E119 Type 2 diabetes mellitus without complications: Secondary | ICD-10-CM | POA: Diagnosis not present

## 2011-09-05 DIAGNOSIS — M25519 Pain in unspecified shoulder: Secondary | ICD-10-CM | POA: Diagnosis not present

## 2011-09-05 DIAGNOSIS — G35 Multiple sclerosis: Secondary | ICD-10-CM | POA: Diagnosis not present

## 2011-09-05 DIAGNOSIS — I509 Heart failure, unspecified: Secondary | ICD-10-CM | POA: Diagnosis not present

## 2011-09-05 DIAGNOSIS — I428 Other cardiomyopathies: Secondary | ICD-10-CM | POA: Diagnosis not present

## 2011-11-14 DIAGNOSIS — N401 Enlarged prostate with lower urinary tract symptoms: Secondary | ICD-10-CM | POA: Diagnosis not present

## 2011-11-14 DIAGNOSIS — I10 Essential (primary) hypertension: Secondary | ICD-10-CM | POA: Diagnosis not present

## 2011-11-21 DIAGNOSIS — E7800 Pure hypercholesterolemia, unspecified: Secondary | ICD-10-CM | POA: Diagnosis not present

## 2011-11-21 DIAGNOSIS — I10 Essential (primary) hypertension: Secondary | ICD-10-CM | POA: Diagnosis not present

## 2011-11-28 ENCOUNTER — Ambulatory Visit (INDEPENDENT_AMBULATORY_CARE_PROVIDER_SITE_OTHER): Payer: Medicare Other | Admitting: Internal Medicine

## 2011-11-28 VITALS — BP 142/84 | HR 76 | Temp 98.1°F

## 2011-11-28 DIAGNOSIS — D485 Neoplasm of uncertain behavior of skin: Secondary | ICD-10-CM | POA: Diagnosis not present

## 2011-11-28 DIAGNOSIS — E119 Type 2 diabetes mellitus without complications: Secondary | ICD-10-CM | POA: Diagnosis not present

## 2011-11-28 DIAGNOSIS — N400 Enlarged prostate without lower urinary tract symptoms: Secondary | ICD-10-CM | POA: Diagnosis not present

## 2011-11-28 DIAGNOSIS — E785 Hyperlipidemia, unspecified: Secondary | ICD-10-CM | POA: Diagnosis not present

## 2011-11-28 DIAGNOSIS — L989 Disorder of the skin and subcutaneous tissue, unspecified: Secondary | ICD-10-CM

## 2011-11-28 DIAGNOSIS — L919 Hypertrophic disorder of the skin, unspecified: Secondary | ICD-10-CM | POA: Diagnosis not present

## 2011-11-28 LAB — LIPID PANEL
Cholesterol: 129 mg/dL (ref 0–200)
LDL Cholesterol: 73 mg/dL (ref 0–99)

## 2011-11-28 NOTE — Assessment & Plan Note (Signed)
Per endocrinology, reports very good control

## 2011-11-28 NOTE — Assessment & Plan Note (Signed)
Status post TURP a few months ago doing well.

## 2011-11-28 NOTE — Progress Notes (Signed)
  Subjective:    Patient ID: Jaime [name], male    DOB: 1940/09/23, 71 y.o.   MRN: [id number]  HPI Routine office visit, here with his wife. Diabetes, he sees endocrinology regularly, reports excellent control of his blood sugar. Tobacco, he quit smoking, he is using the "electronic cigarette". Recent labs reviewed, last BMP and CBC were fine, platelets were slightly decreased. Also complaining of a skin lesion at the right elbow, is increasing in size, his g-childrenare "pick on it" and causing discomfort.   Past Medical History:  MS  DM  HYPERLIPIDEMIA  CARDIOMYOPATHY, ISCHEMIC  EF 45%..... echo.. January, 2008... severe hypokinesis inferior and posterior walls at base and mid ventricle  Pulmonary hypertension moderate.... echo.... January, 2008  CAD non-STEMI.. 2007.Marland Kitchen occluded circumflex... Taxus stent placed.... residual 80% LAD.Marland Kitchen 50% RCA  BPH  Lung nodule-- resolved per 11-2006 CT chest  Past Surgical History:  TURP, Dr Nessi---> 07-30-11  hernia repair (R)  Social History:  Married, children x 2  Current Smoker, has cut down " 1 pack a month"  Alcohol use-no    Review of Systems Meds reviewed, good compliance.    Objective:   Physical Exam  Skin:       Alert oriented x3, no apparent distress.     Assessment & Plan:   Skin lesion: Causing discomfort, growing. Procedure note: In a sterile fashion and under LA w/ half cc of lidocaine 2% without, we excised the lesion with scissors. The area was cauterized with silver nitrate. No bleeding. Sample sent to pathology. Patient will call if redness or discharge. Advice to keep the area clean, dry. Okay to use over-the-counter antibiotic ointments  Addendum-- path benign

## 2011-11-28 NOTE — Assessment & Plan Note (Signed)
Due for labs

## 2011-11-29 ENCOUNTER — Encounter: Payer: Self-pay | Admitting: Internal Medicine

## 2011-12-02 ENCOUNTER — Encounter: Payer: Self-pay | Admitting: *Deleted

## 2011-12-05 ENCOUNTER — Other Ambulatory Visit: Payer: Self-pay | Admitting: *Deleted

## 2011-12-05 MED ORDER — SIMVASTATIN 40 MG PO TABS: 40.0000 mg | ORAL_TABLET | Freq: Every day | ORAL | Status: DC

## 2011-12-05 NOTE — Telephone Encounter (Signed)
Rx sent 

## 2011-12-11 ENCOUNTER — Telehealth: Payer: Self-pay | Admitting: Internal Medicine

## 2011-12-11 ENCOUNTER — Emergency Department (HOSPITAL_COMMUNITY)
Admission: EM | Admit: 2011-12-11 | Discharge: 2011-12-11 | Disposition: A | Discharge status: Home / Self Care | Payer: Medicare Other | Attending: Emergency Medicine | Admitting: Emergency Medicine

## 2011-12-11 ENCOUNTER — Encounter (HOSPITAL_COMMUNITY): Payer: Self-pay | Admitting: Emergency Medicine

## 2011-12-11 ENCOUNTER — Emergency Department (HOSPITAL_COMMUNITY): Payer: Medicare Other

## 2011-12-11 DIAGNOSIS — I509 Heart failure, unspecified: Secondary | ICD-10-CM | POA: Diagnosis not present

## 2011-12-11 DIAGNOSIS — Z794 Long term (current) use of insulin: Secondary | ICD-10-CM | POA: Insufficient documentation

## 2011-12-11 DIAGNOSIS — I5020 Unspecified systolic (congestive) heart failure: Secondary | ICD-10-CM | POA: Insufficient documentation

## 2011-12-11 DIAGNOSIS — I10 Essential (primary) hypertension: Secondary | ICD-10-CM | POA: Insufficient documentation

## 2011-12-11 DIAGNOSIS — I252 Old myocardial infarction: Secondary | ICD-10-CM | POA: Diagnosis not present

## 2011-12-11 DIAGNOSIS — M79609 Pain in unspecified limb: Secondary | ICD-10-CM | POA: Insufficient documentation

## 2011-12-11 DIAGNOSIS — Z79899 Other long term (current) drug therapy: Secondary | ICD-10-CM | POA: Insufficient documentation

## 2011-12-11 DIAGNOSIS — E785 Hyperlipidemia, unspecified: Secondary | ICD-10-CM | POA: Insufficient documentation

## 2011-12-11 DIAGNOSIS — I2789 Other specified pulmonary heart diseases: Secondary | ICD-10-CM | POA: Insufficient documentation

## 2011-12-11 DIAGNOSIS — G35 Multiple sclerosis: Secondary | ICD-10-CM | POA: Diagnosis not present

## 2011-12-11 DIAGNOSIS — M25579 Pain in unspecified ankle and joints of unspecified foot: Secondary | ICD-10-CM | POA: Diagnosis not present

## 2011-12-11 DIAGNOSIS — M79672 Pain in left foot: Secondary | ICD-10-CM

## 2011-12-11 DIAGNOSIS — R609 Edema, unspecified: Secondary | ICD-10-CM | POA: Insufficient documentation

## 2011-12-11 DIAGNOSIS — E119 Type 2 diabetes mellitus without complications: Secondary | ICD-10-CM | POA: Diagnosis not present

## 2011-12-11 DIAGNOSIS — M7989 Other specified soft tissue disorders: Secondary | ICD-10-CM | POA: Insufficient documentation

## 2011-12-11 DIAGNOSIS — I2510 Atherosclerotic heart disease of native coronary artery without angina pectoris: Secondary | ICD-10-CM | POA: Insufficient documentation

## 2011-12-11 LAB — CBC
HCT: 47.1 % (ref 39.0–52.0)
Hemoglobin: 15.6 g/dL (ref 13.0–17.0)
MCHC: 33.1 g/dL (ref 30.0–36.0)
RBC: 5.89 MIL/uL — ABNORMAL HIGH (ref 4.22–5.81)
WBC: 5.4 10*3/uL (ref 4.0–10.5)

## 2011-12-11 LAB — POCT I-STAT, CHEM 8
Chloride: 108 mEq/L (ref 96–112)
HCT: 52 % (ref 39.0–52.0)
Hemoglobin: 17.7 g/dL — ABNORMAL HIGH (ref 13.0–17.0)
Potassium: 3.9 mEq/L (ref 3.5–5.1)
Sodium: 144 mEq/L (ref 135–145)

## 2011-12-11 LAB — DIFFERENTIAL
Lymphocytes Relative: 47 % — ABNORMAL HIGH (ref 12–46)
Lymphs Abs: 2.6 10*3/uL (ref 0.7–4.0)
Monocytes Absolute: 0.3 10*3/uL (ref 0.1–1.0)
Monocytes Relative: 5 % (ref 3–12)
Neutro Abs: 2.4 10*3/uL (ref 1.7–7.7)
Neutrophils Relative %: 44 % (ref 43–77)

## 2011-12-11 LAB — PROTIME-INR
INR: 0.94 (ref 0.00–1.49)
Prothrombin Time: 12.8 seconds (ref 11.6–15.2)

## 2011-12-11 LAB — URIC ACID: Uric Acid, Serum: 5.1 mg/dL (ref 4.0–7.8)

## 2011-12-11 MED ORDER — HYDROCODONE-ACETAMINOPHEN 5-325 MG PO TABS
1.0000 | ORAL_TABLET | Freq: Once | ORAL | Status: AC
  Administered 2011-12-11: 1 via ORAL
  Filled 2011-12-11: qty 1

## 2011-12-11 MED ORDER — HYDROCODONE-ACETAMINOPHEN 5-325 MG PO TABS: 1.0000 | ORAL_TABLET | ORAL | Status: AC | PRN

## 2011-12-11 NOTE — Discharge Instructions (Signed)
Return in the morning for ultrasound of your left leg to rule out blood clot in the vein. Return immediately for fever, chills, worsening pain, chest pain or shortness of breath

## 2011-12-11 NOTE — ED Notes (Signed)
Pt returned from xray

## 2011-12-11 NOTE — Telephone Encounter (Signed)
Noted  

## 2011-12-11 NOTE — ED Provider Notes (Signed)
History     CSN: [id number]  Arrival date & time 12/11/11  1857   None     Chief Complaint  Patient presents with  . Toe Pain    (Consider location/radiation/quality/duration/timing/severity/associated sxs/prior treatment) HPI Comments: The past 2, weeks, Jaime [name], who is wheelchair-bound due to multiple sclerosis.  Has had progressive swelling, and pain of the toes on his left foot.  He has no personal history of gout.  He does have a history of CAD with a cardiac stent placed for an occluded LAD by Dr. Myrtis Ser.  He is not currently taking any anticoagulant.  He does not report that his blood sugars have been elevated.  He does have a history of high cholesterol insulin-dependent diabetes.  He denies injury or trauma to the foot.  He has not seen Dr. Windle Guard in 2 weeks, call them today and was instructed to come to the emergency department  Patient is a 71 y.o. male presenting with toe pain.  Toe Pain    Past Medical History  Diagnosis Date  . Hyperlipidemia   . Cardiomyopathy, ischemic     EF 45% per ECHO 2008  . Pulmonary hypertension     moderate ECHO Jan 2008  . CHF (congestive heart failure)   . Systolic heart failure   . Multiple sclerosis DX 1993    NEUROLOGIST-  DR DOHMEIER -- LAST VISIT 11-20-2010  NOTE W/ CHART  . Lung nodule     resolved 11-2006 CT Chest  . Tobacco abuse   . Increased prostate specific antigen (PSA) velocity   . Urinary retention     dx ~ 2-12, like from MS, now with a catheter, saw urology  . Ejection fraction      EF 45%, echo, 2008  . Cellulitis   . Pre-syncope     Near syncope, ER visit, October, 2011  . Carotid artery disease     Doppler, February, 2012, 0-39% bilateral,Mild smooth plaque  . Chronic indwelling foley catheter   . Scoliosis associated with other condition   . Hemiparesis   . Fatigue SEVERE  . Weakness   . Impotence   . MI, acute, non ST segment elevation 2007    S/P PCI WITH X1 STENT (TAXUS DRUG-ELUTING) LEFT CIRCUMFLEX   . Echocardiogram abnormal 08-17-2006    SEVERE HYPOKINESIS OF INFERIOR , POSTEROR WALLS OF BASE AND MID VENTRICLE AND MOD. PULMONARY HTN  . Diabetes mellitus     INSULIN-DEPENDANT  . CAD (coronary artery disease) CARDIOLOGIST- DR KATZ-- VISIT 06-05-2011 IN [facility]    non-STEMI, 2007.Marland Kitchenoccluded circumflex.. Taxus stent placed...residual 80% LAD...50% RCA  . Hypertension   . H/O pleural effusion 2008    POST THORACENTESIS  . History of colon polyps PRECANCEROUS  . Insomnia   . Ingrown right big toenail CHRONIC    PER PT VERY SORE    Past Surgical History  Procedure Date  . Hernia repair     (R)  . Coronary angioplasty with stent placement 05-01-2006    OCCLUDED CIRCUMFLEX -- TAXUS STENT PLACMENT  AND RESIDUAL 80% LAD,  50% RCA  . Thoracentesis 2008    PLEURAL EFFUSION  . Cystoscopy 07/30/2011    Procedure: CYSTOSCOPY;  Surgeon: Lindaann Slough, MD;  Location: [facility];  Service: Urology;  Laterality: N/A;  . Transurethral resection of prostate 07/30/2011    Procedure: TRANSURETHRAL RESECTION OF THE PROSTATE (TURP);  Surgeon: Lindaann Slough, MD;  Location: [facility];  Service: Urology;  Laterality: N/A;  Family History  Problem Relation Age of Onset  . Hypertension    . Heart attack Mother 51  . Colon cancer Neg Hx   . Prostate cancer Neg Hx     History  Substance Use Topics  . Smoking status: Current Some Day Smoker -- 50 years    Types: Cigarettes  . Smokeless tobacco: Not on file   Comment: 1 PPWEEK  . Alcohol Use: No      Review of Systems  Allergies  Bee venom  Home Medications   Current Outpatient Rx  Name Route Sig Dispense Refill  . ACETAMINOPHEN 500 MG PO TABS Oral Take 1,000 mg by mouth every 6 (six) hours as needed. For pain    . AMANTADINE HCL 100 MG PO CAPS Oral Take 200 mg by mouth 2 (two) times daily. By urologist    . BD PEN NEEDLE SHORT U/F 31G X 8 MM MISC      . DICLOFENAC SODIUM 1 % TD GEL Topical Apply 1  application topically 4 (four) times daily as needed. For pain    . HYDROCODONE-ACETAMINOPHEN 5-325 MG PO TABS Oral Take 1 tablet by mouth every 6 (six) hours as needed. For pain    . INSULIN ASPART PROT & ASPART (70-30) 100 UNIT/ML [place] SUSP Subcutaneous Inject 10-30 Units into the skin 2 (two) times daily. 30 units at breakfast and 10 units at 10pm    . BACITRACIN-NEOMYCIN-POLYMYXIN 400-11-4998 EX OINT Topical Apply 1 application topically every 12 (twelve) hours.    Marland Kitchen SIMVASTATIN 40 MG PO TABS Oral Take 1 tablet (40 mg total) by mouth at bedtime. 90 tablet 1  . HYDROCODONE-ACETAMINOPHEN 5-325 MG PO TABS Oral Take 1 tablet by mouth every 4 (four) hours as needed for pain. 6 tablet 0    BP 180/89  Pulse 84  Temp(Src) 97.9 F (36.6 C) (Oral)  Resp 18  SpO2 100%  Physical Exam  ED Course  Procedures (including critical care time)  Labs Reviewed  CBC - Abnormal; Notable for the following:    RBC 5.89 (*)    RDW 16.7 (*)    All other components within normal limits  DIFFERENTIAL - Abnormal; Notable for the following:    Lymphocytes Relative 47 (*)    All other components within normal limits  D-DIMER, QUANTITATIVE - Abnormal; Notable for the following:    D-Dimer, Quant 2.50 (*)    All other components within normal limits  POCT I-STAT, CHEM 8 - Abnormal; Notable for the following:    BUN 24 (*)    Glucose, Bld 147 (*)    Hemoglobin 17.7 (*)    All other components within normal limits  URIC ACID  PROTIME-INR   Dg Foot Complete Left  12/11/2011  *RADIOLOGY REPORT*  Clinical Data: Toe pain  LEFT FOOT - COMPLETE 3+ VIEW  Comparison: None.  Findings: Osteopenia.  No acute fracture and no dislocation.  IMPRESSION: No acute bony pathology.  Chronic changes.  Original Report Authenticated By: Donavan Burnet, M.D.     1. Foot pain, left       MDM   Concerned for a DVT or some kind of clot in the foot, causing swelling, discoloration.  Will check CBC i-STAT d-dimer foot.  X-ray,  and uric acid Patient will need a vascular study of the left lower leg        Arman Filter, NP 12/12/11 [phone]

## 2011-12-11 NOTE — ED Notes (Addendum)
Pt c/o left toe pain x 3 weeks that became worse today; pt denies obvious injury; pt wheelchair bound from MS; per wife toes slightly swollen

## 2011-12-11 NOTE — ED Notes (Signed)
Rx given x1 D/c instructions reviewed w/ pt and family - pt and family deny any further questions or concerns at present.  

## 2011-12-11 NOTE — Telephone Encounter (Signed)
Caller: Jaime [name]; PCP: Jaime [name]; CB#: [phone]; ; ; Call regarding Patient Having Extreme Left Foot Toes Pain.;  Wife Jaime [name] states Jaime [name] has had pain in toes x several weeks. Has been taking Hydrocodone for pain. Pain becoming severe on 12/11/11. Toes slightly swollen on 12/11/11 . Has numbness in foot on 5/22 x one hour. Per diabetes foot problems protocol has ED disposition due to new onset of severe pain and numbness of toes and foot. No appt available in office. Office notified. Advised ED and F/U with office if needed.

## 2011-12-11 NOTE — ED Provider Notes (Signed)
History     CSN: [id number]  Arrival date & time 12/11/11  1857   First MD Initiated Contact with Patient 12/11/11 2101      Chief Complaint  Patient presents with  . Toe Pain    (Consider location/radiation/quality/duration/timing/severity/associated sxs/prior treatment) HPI Pt p/w L foot /toe pain x 2 weeks. No history of trauma, fever, chills. Mild swelling. Pt has history of MS and is wheelchair bound. He specifically denied CP , SOB.  Past Medical History  Diagnosis Date  . Hyperlipidemia   . Cardiomyopathy, ischemic     EF 45% per ECHO 2008  . Pulmonary hypertension     moderate ECHO Jan 2008  . CHF (congestive heart failure)   . Systolic heart failure   . Multiple sclerosis DX 1993    NEUROLOGIST-  DR DOHMEIER -- LAST VISIT 11-20-2010  NOTE W/ CHART  . Lung nodule     resolved 11-2006 CT Chest  . Tobacco abuse   . Increased prostate specific antigen (PSA) velocity   . Urinary retention     dx ~ 2-12, like from MS, now with a catheter, saw urology  . Ejection fraction      EF 45%, echo, 2008  . Cellulitis   . Pre-syncope     Near syncope, ER visit, October, 2011  . Carotid artery disease     Doppler, February, 2012, 0-39% bilateral,Mild smooth plaque  . Chronic indwelling foley catheter   . Scoliosis associated with other condition   . Hemiparesis   . Fatigue SEVERE  . Weakness   . Impotence   . MI, acute, non ST segment elevation 2007    S/P PCI WITH X1 STENT (TAXUS DRUG-ELUTING) LEFT CIRCUMFLEX  . Echocardiogram abnormal 08-17-2006    SEVERE HYPOKINESIS OF INFERIOR , POSTEROR WALLS OF BASE AND MID VENTRICLE AND MOD. PULMONARY HTN  . Diabetes mellitus     INSULIN-DEPENDANT  . CAD (coronary artery disease) CARDIOLOGIST- DR KATZ-- VISIT 06-05-2011 IN [facility]    non-STEMI, 2007.Marland Kitchenoccluded circumflex.. Taxus stent placed...residual 80% LAD...50% RCA  . Hypertension   . H/O pleural effusion 2008    POST THORACENTESIS  . History of colon polyps PRECANCEROUS  .  Insomnia   . Ingrown right big toenail CHRONIC    PER PT VERY SORE    Past Surgical History  Procedure Date  . Hernia repair     (R)  . Coronary angioplasty with stent placement 05-01-2006    OCCLUDED CIRCUMFLEX -- TAXUS STENT PLACMENT  AND RESIDUAL 80% LAD,  50% RCA  . Thoracentesis 2008    PLEURAL EFFUSION  . Cystoscopy 07/30/2011    Procedure: CYSTOSCOPY;  Surgeon: Lindaann Slough, MD;  Location: [facility];  Service: Urology;  Laterality: N/A;  . Transurethral resection of prostate 07/30/2011    Procedure: TRANSURETHRAL RESECTION OF THE PROSTATE (TURP);  Surgeon: Lindaann Slough, MD;  Location: [facility];  Service: Urology;  Laterality: N/A;    Family History  Problem Relation Age of Onset  . Hypertension    . Heart attack Mother 64  . Colon cancer Neg Hx   . Prostate cancer Neg Hx     History  Substance Use Topics  . Smoking status: Current Some Day Smoker -- 50 years    Types: Cigarettes  . Smokeless tobacco: Not on file   Comment: 1 PPWEEK  . Alcohol Use: No      Review of Systems  Constitutional: Negative for fever and chills.  Respiratory: Negative  for chest tightness and shortness of breath.   Cardiovascular: Negative for chest pain and palpitations.  Gastrointestinal: Negative for nausea and vomiting.  Skin: Negative for color change, rash and wound.    Allergies  Bee venom  Home Medications   Current Outpatient Rx  Name Route Sig Dispense Refill  . ACETAMINOPHEN 500 MG PO TABS Oral Take 1,000 mg by mouth every 6 (six) hours as needed. For pain    . AMANTADINE HCL 100 MG PO CAPS Oral Take 200 mg by mouth 2 (two) times daily. By urologist    . BD PEN NEEDLE SHORT U/F 31G X 8 MM MISC      . DICLOFENAC SODIUM 1 % TD GEL Topical Apply 1 application topically 4 (four) times daily as needed. For pain    . HYDROCODONE-ACETAMINOPHEN 5-325 MG PO TABS Oral Take 1 tablet by mouth every 6 (six) hours as needed. For pain    .  INSULIN ASPART PROT & ASPART (70-30) 100 UNIT/ML [place] SUSP Subcutaneous Inject 10-30 Units into the skin 2 (two) times daily. 30 units at breakfast and 10 units at 10pm    . BACITRACIN-NEOMYCIN-POLYMYXIN 400-11-4998 EX OINT Topical Apply 1 application topically every 12 (twelve) hours.    Marland Kitchen SIMVASTATIN 40 MG PO TABS Oral Take 1 tablet (40 mg total) by mouth at bedtime. 90 tablet 1  . HYDROCODONE-ACETAMINOPHEN 5-325 MG PO TABS Oral Take 1 tablet by mouth every 4 (four) hours as needed for pain. 6 tablet 0    BP 139/96  Pulse 68  Temp(Src) 98 F (36.7 C) (Oral)  Resp 18  SpO2 97%  Physical Exam  Nursing note and vitals reviewed. Constitutional: He is oriented to person, place, and time. He appears well-developed and well-nourished. No distress.  HENT:  Head: Normocephalic and atraumatic.  Mouth/Throat: Oropharynx is clear and moist.  Eyes: EOM are normal. Pupils are equal, round, and reactive to light.  Neck: Normal range of motion. Neck supple.  Cardiovascular: Normal rate and regular rhythm.   Pulmonary/Chest: Effort normal and breath sounds normal. No respiratory distress. He has no wheezes. He has no rales.  Abdominal: Soft. Bowel sounds are normal. There is no tenderness. There is no rebound and no guarding.  Musculoskeletal: Normal range of motion. He exhibits edema (Pt has very mild L foot edema. No focal tenderness, Both LE mild cool. DP 2+. No calf tenderness). He exhibits no tenderness.  Neurological: He is alert and oriented to person, place, and time.  Skin: Skin is warm and dry. No rash noted. No erythema.  Psychiatric: He has a normal mood and affect. His behavior is normal.    ED Course  Procedures (including critical care time)  Labs Reviewed  CBC - Abnormal; Notable for the following:    RBC 5.89 (*)    RDW 16.7 (*)    All other components within normal limits  DIFFERENTIAL - Abnormal; Notable for the following:    Lymphocytes Relative 47 (*)    All other  components within normal limits  D-DIMER, QUANTITATIVE - Abnormal; Notable for the following:    D-Dimer, Quant 2.50 (*)    All other components within normal limits  POCT I-STAT, CHEM 8 - Abnormal; Notable for the following:    BUN 24 (*)    Glucose, Bld 147 (*)    Hemoglobin 17.7 (*)    All other components within normal limits  PROTIME-INR  URIC ACID   Dg Foot Complete Left  12/11/2011  *RADIOLOGY REPORT*  Clinical Data: Toe pain  LEFT FOOT - COMPLETE 3+ VIEW  Comparison: None.  Findings: Osteopenia.  No acute fracture and no dislocation.  IMPRESSION: No acute bony pathology.  Chronic changes.  Original Report Authenticated By: Donavan Burnet, M.D.     1. Foot pain, left       MDM  Work up neg thus far except for elevated DVT. Will d/c home to return in the morning for [place]. Return immediately for worsening pain, fever, chills, CP or SOB.         Loren Racer, MD 12/11/11 2237

## 2011-12-11 NOTE — ED Notes (Signed)
Pt resting comfortably waiting for room on POD A

## 2011-12-12 ENCOUNTER — Ambulatory Visit (HOSPITAL_COMMUNITY)
Admission: RE | Admit: 2011-12-12 | Discharge: 2011-12-12 | Disposition: A | Discharge status: Home / Self Care | Payer: Medicare Other | Source: Ambulatory Visit | Attending: Emergency Medicine | Admitting: Emergency Medicine

## 2011-12-12 ENCOUNTER — Telehealth: Payer: Self-pay | Admitting: *Deleted

## 2011-12-12 ENCOUNTER — Other Ambulatory Visit: Payer: Self-pay

## 2011-12-12 DIAGNOSIS — I739 Peripheral vascular disease, unspecified: Secondary | ICD-10-CM

## 2011-12-12 DIAGNOSIS — R52 Pain, unspecified: Secondary | ICD-10-CM

## 2011-12-12 DIAGNOSIS — M79609 Pain in unspecified limb: Secondary | ICD-10-CM | POA: Insufficient documentation

## 2011-12-12 DIAGNOSIS — R0989 Other specified symptoms and signs involving the circulatory and respiratory systems: Secondary | ICD-10-CM

## 2011-12-12 DIAGNOSIS — M899 Disorder of bone, unspecified: Secondary | ICD-10-CM | POA: Insufficient documentation

## 2011-12-12 DIAGNOSIS — I743 Embolism and thrombosis of arteries of the lower extremities: Secondary | ICD-10-CM | POA: Insufficient documentation

## 2011-12-12 DIAGNOSIS — M949 Disorder of cartilage, unspecified: Secondary | ICD-10-CM | POA: Insufficient documentation

## 2011-12-12 NOTE — Telephone Encounter (Signed)
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  Pt still has severe pain but controlled w/ vicodin I discussed the case w/ cards on call , as long as is not worse than yesterday ok to wait till the morning for a urgent vascular referral. Wife states that color and temperature of the L foot is unchanged from yesterday Plan: ER if any changes contac vascular surgery in AM

## 2011-12-12 NOTE — Telephone Encounter (Signed)
Helene from the Vascular lab called about the pt that was sent to the ER yesterday & they would like an order put in to do an ABI study. Ok to order?

## 2011-12-12 NOTE — Telephone Encounter (Signed)
yes

## 2011-12-12 NOTE — Progress Notes (Signed)
VASCULAR LAB PRELIMINARY  PRELIMINARY  PRELIMINARY  PRELIMINARY  Left lower extremity venous duplex completed.    Preliminary report:  Left:  No evidence of DVT, superficial thrombosis, or Baker's cyst.  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      ABI completed:    RIGHT    LEFT    PRESSURE WAVEFORM  PRESSURE WAVEFORM  BRACHIAL 156 triphasic BRACHIAL 160 tripahsic  DP absent  DP absent   AT   AT    PT 88 monophasic PT absent   PER   PER 87 monophasic  GREAT TOE flat NA GREAT TOE 70 NA    RIGHT LEFT  ABI 0.55 moderate to severe range 0.54 moderate to severe range     Terance Hart, RVT 12/12/2011, 2:44 PM

## 2011-12-13 ENCOUNTER — Ambulatory Visit (INDEPENDENT_AMBULATORY_CARE_PROVIDER_SITE_OTHER): Payer: Medicare Other | Admitting: Vascular Surgery

## 2011-12-13 ENCOUNTER — Other Ambulatory Visit: Payer: Self-pay

## 2011-12-13 ENCOUNTER — Encounter: Payer: Self-pay | Admitting: Vascular Surgery

## 2011-12-13 VITALS — BP 117/83 | HR 74 | Resp 20 | Ht 71.0 in | Wt 140.0 lb

## 2011-12-13 DIAGNOSIS — I998 Other disorder of circulatory system: Secondary | ICD-10-CM | POA: Insufficient documentation

## 2011-12-13 DIAGNOSIS — I70229 Atherosclerosis of native arteries of extremities with rest pain, unspecified extremity: Secondary | ICD-10-CM | POA: Insufficient documentation

## 2011-12-13 DIAGNOSIS — I999 Unspecified disorder of circulatory system: Secondary | ICD-10-CM | POA: Diagnosis not present

## 2011-12-13 DIAGNOSIS — M629 Disorder of muscle, unspecified: Secondary | ICD-10-CM | POA: Insufficient documentation

## 2011-12-13 NOTE — Telephone Encounter (Signed)
Discuss case with Dr. Darrick Penna, vascular surgery. I asked for an evaluation. There will contact the patient today.

## 2011-12-13 NOTE — ED Provider Notes (Signed)
Medical screening examination/treatment/procedure(s) were performed by non-physician practitioner and as supervising physician I was immediately available for consultation/collaboration.   Loren Racer, MD 12/13/11 [phone]

## 2011-12-13 NOTE — Progress Notes (Signed)
[facility] [place]  Referred by:  Jose E [name], MD [address] [place], [place] [address]  Reason for referral: B leg pain  History of Present Illness  Jaime [name] is a 71 y.o. (04/08/1941) male who presents with chief complaint: constant bilateral (L>R) leg pain.  Onset of symptom occurred 2 weeks ago.  Pain is described as aching, severity 3-6/10, and associated with rest.  Patient has severe MS and is wheelchair bound.  Patient has attempted to treat this pain with Vicodin.  The patient has rest pain symptoms also and no leg wounds/ulcers.  Atherosclerotic risk factors include: hyperlipidemia, DM, and smoking.  Pt's family believe Dr. Early might have placed a stent in L femoral artery previously.   Past Medical History  Diagnosis Date  . Hyperlipidemia   . Cardiomyopathy, ischemic     EF 45% per ECHO 2008  . Pulmonary hypertension     moderate ECHO Jan 2008  . CHF (congestive heart failure)   . Systolic heart failure   . Multiple sclerosis DX 1993    NEUROLOGIST-  DR DOHMEIER -- LAST VISIT 11-20-2010  NOTE W/ CHART  . Lung nodule     resolved 11-2006 CT Chest  . Tobacco abuse   . Increased prostate specific antigen (PSA) velocity   . Urinary retention     dx ~ 2-12, like from MS, now with a catheter, saw urology  . Ejection fraction      EF 45%, echo, 2008  . Cellulitis   . Pre-syncope     Near syncope, ER visit, October, 2011  . Carotid artery disease     Doppler, February, 2012, 0-39% bilateral,Mild smooth plaque  . Chronic indwelling foley catheter   . Scoliosis associated with other condition   . Hemiparesis   . Fatigue SEVERE  . Weakness   . Impotence   . MI, acute, non ST segment elevation 2007    S/P PCI WITH X1 STENT (TAXUS DRUG-ELUTING) LEFT CIRCUMFLEX  . Echocardiogram abnormal 08-17-2006    SEVERE HYPOKINESIS OF INFERIOR , POSTEROR WALLS OF BASE AND MID VENTRICLE AND MOD. PULMONARY HTN  . Diabetes mellitus      INSULIN-DEPENDANT  . CAD (coronary artery disease) CARDIOLOGIST- DR KATZ-- VISIT 06-05-2011 IN [facility]    non-STEMI, 2007..occluded circumflex.. Taxus stent placed...residual 80% LAD...50% RCA  . Hypertension   . H/O pleural effusion 2008    POST THORACENTESIS  . History of colon polyps PRECANCEROUS  . Insomnia   . Ingrown right big toenail CHRONIC    PER PT VERY SORE    Past Surgical History  Procedure Date  . Hernia repair     (R)  . Coronary angioplasty with stent placement 05-01-2006    OCCLUDED CIRCUMFLEX -- TAXUS STENT PLACMENT  AND RESIDUAL 80% LAD,  50% RCA  . Thoracentesis 2008    PLEURAL EFFUSION  . Cystoscopy 07/30/2011    Procedure: CYSTOSCOPY;  Surgeon: Marc-Henry Nesi, MD;  Location: [facility];  Service: Urology;  Laterality: N/A;  . Transurethral resection of prostate 07/30/2011    Procedure: TRANSURETHRAL RESECTION OF THE PROSTATE (TURP);  Surgeon: Marc-Henry Nesi, MD;  Location: [facility];  Service: Urology;  Laterality: N/A;    History   Social History  . Marital Status: Married    Spouse Name: N/A    Number of Children: 2  . Years of Education: N/A   Occupational History  . disable      Social History Main Topics  . Smoking status: Current Some Day Smoker -- 50 years    Types: Cigarettes  . Smokeless tobacco: Never Used   Comment: 1 PPWEEK  . Alcohol Use: No  . Drug Use: Not on file  . Sexually Active: Not on file   Other Topics Concern  . Not on file   Social History Narrative   Lives w/ wife    Family History  Problem Relation Age of Onset  . Hypertension    . Heart attack Mother 83  . Heart disease Mother   . Stroke Mother   . Colon cancer Neg Hx   . Prostate cancer Neg Hx   . COPD Father   . Peripheral vascular disease Father     Current Outpatient Prescriptions on File Prior to Visit  Medication Sig Dispense Refill  . acetaminophen (TYLENOL) 500 MG tablet Take 1,000 mg by mouth every 6 (six) hours  as needed. For pain      . amantadine (SYMMETREL) 100 MG capsule Take 200 mg by mouth 2 (two) times daily. By urologist      . B-D ULTRAFINE III SHORT PEN 31G X 8 MM MISC       . diclofenac sodium (VOLTAREN) 1 % GEL Apply 1 application topically 4 (four) times daily as needed. For pain      . HYDROcodone-acetaminophen (NORCO) 5-325 MG per tablet Take 1 tablet by mouth every 6 (six) hours as needed. For pain      . HYDROcodone-acetaminophen (NORCO) 5-325 MG per tablet Take 1 tablet by mouth every 4 (four) hours as needed for pain.  6 tablet  0  . insulin aspart protamine-insulin aspart (NOVOLOG 70/30) (70-30) 100 UNIT/ML injection Inject 10-30 Units into the skin 2 (two) times daily. 30 units at breakfast and 10 units at 10pm      . simvastatin (ZOCOR) 40 MG tablet Take 1 tablet (40 mg total) by mouth at bedtime.  90 tablet  1  . neomycin-bacitracin-polymyxin (NEOSPORIN) ointment Apply 1 application topically every 12 (twelve) hours.        Allergies  Allergen Reactions  . Bee Venom Anaphylaxis     Review of Systems (Positive items checked otherwise negative)  General: [ ] Weight loss, [ ] Weight gain, [ ]  Loss of appetite, [ ] Fever  Neurologic: [ ] Dizziness, [ ] Blackouts, [ ] Headaches, [ ] Seizure, [x] MS  Ear/Nose/Throat: [ ] Change in eyesight, [ ] Change in hearing, [ ] Nose bleeds, [ ] Sore throat  Vascular: [ ] Pain in legs with walking, [x] Pain in feet while lying flat, [ ] Non-healing ulcer, [] Stroke, [ ] "Mini stroke", [ ] Slurred speech, [ ] Temporary blindness, [ ] Blood clot in vein, [ ] Phlebitis  Pulmonary: [ ] Home oxygen, [ ] Productive cough, [ ] Bronchitis, [ ] Coughing up blood, [ ] Asthma, [ ] Wheezing  Musculoskeletal: [ ] Arthritis, [ ] Joint pain, [ ] Muscle pain  Cardiac: [ ] Chest pain, [ ] Chest tightness/pressure, [ ] Shortness of breath when lying flat, [ ] Shortness of breath with exertion, [ ] Palpitations, [ ] Heart murmur, [ ] Arrythmia,  [ ]  Atrial fibrillation  Hematologic: [ ] Bleeding problems, [ ] Clotting disorder, [ ] Anemia  Psychiatric:  [ ] Depression, [ ] Anxiety, [ ] Attention deficit disorder  Gastrointestinal:  [ ] Black stool,[ ]  Blood in stool, [ ] Peptic   ulcer disease, [ ] Reflux, [ ] Hiatal hernia, [ ] Trouble swallowing, [ ] Diarrhea, [ ] Constipation  Urinary:  [ ] Kidney disease, [ ] Burning with urination, [ ] Frequent urination, [ ] Difficulty urinating  Skin: [ ] Ulcers, [ ] Rashes   Physical Examination  Filed Vitals:   12/13/11 1546  BP: 117/83  Pulse: 74  Resp: 20  Height: 5' 11" (1.803 m)  Weight: 140 lb (63.504 kg)   Body mass index is 19.53 kg/(m^2).  General: A&O x 3, appear debilitated in wheelchair  Head: [place]/AT  Ear/Nose/Throat: Hearing grossly intact, nares w/o erythema or drainage, oropharynx w/o Erythema/Exudate  Eyes: PERRLA, EOMI  Neck: Supple, no nuchal rigidity, no palpable LAD  Pulmonary: Sym exp, good air movt, CTAB, no rales, rhonchi, & wheezing  Cardiac: RRR, Nl S1, S2, no Murmurs, rubs or gallops  Vascular: Vessel Right Left  Radial Palpable Palpable  Brachial Palpable Palpable  Carotid Palpable, without bruit Palpable, without bruit  Aorta Non-palpable N/A  Femoral Palpable Palpable  Popliteal Non-palpable Non-palpable  PT Palpable Palpable  DP Palpable Palpable   Gastrointestinal: soft, NTND, -G/R, - HSM, - masses, - CVAT B  Musculoskeletal: RUE 3-4/5, LUE 4/5, RLE 2-3/5, LLE 3-4/5 , Extremities without ischemic changes , Both feet appear viable, Both leg cool from distal thigh down  Neurologic: CN 2-12 intact , Pain and light touch intact in extremities   Psychiatric: Judgment intact, Mood & affect appropriate for pt's clinical situation  Dermatologic: See M/S exam for extremity exam, no rashes otherwise noted  Lymph : No Cervical, Axillary, or Inguinal lymphadenopathy   Outside Studies/Documentation 10 pages of outside documents were reviewed  including: inpatient L venous duplex: neg, BLE ABI: severe to critical PAD  Medical Decision Making  Jerell E Dubinsky is a 71 y.o. male who presents with: BLE critical limb ischemia (L>R)   I discussed with the patient the natural history of critical limb ischemia: 25% require amputation in one year, 50% are able to maintain their limbs in one year, and 25-30% die in one year due to comorbidities.  Given the limb threatening status of this patient, I recommend an aggressive work up including proceeding with an: Aortogram, Bilateral runoff and intervention. I discussed with the patient the nature of angiographic procedures, especially the limited patencies of any endovascular intervention. The patient is aware of that the risks of an angiographic procedure include but are not limited to: bleeding, infection, access site complications, embolization, rupture of treated vessel, dissection, possible need for emergent surgical intervention, and possible need for surgical procedures to treat the patient's pathology. The patient is aware of the risks and agrees to proceed.  The procedure is scheduled for: Aortogram, bilateral leg runoff, possible left leg intervention with Dr. Brabham on  28 MAY 13.  I discussed in depth with the patient the nature of atherosclerosis, and emphasized the importance of maximal medical management including strict control of blood pressure, blood glucose, and lipid levels, antiplatelet agents, obtaining regular exercise, and cessation of smoking.  The patient is aware that without maximal medical management the underlying atherosclerotic disease process will progress, limiting the benefit of any interventions.  Thank you for allowing us to participate in this patient's care.  Keimon Basaldua, MD [facility] [place] Office: [phone] Pager: [phone]  12/13/2011, 5:46 PM     

## 2011-12-17 ENCOUNTER — Encounter (HOSPITAL_COMMUNITY)
Admission: RE | Disposition: A | Discharge status: Home / Self Care | Payer: Self-pay | Source: Ambulatory Visit | Attending: Surgery

## 2011-12-17 ENCOUNTER — Telehealth: Payer: Self-pay | Admitting: Vascular Surgery

## 2011-12-17 ENCOUNTER — Ambulatory Visit (HOSPITAL_COMMUNITY)
Admission: RE | Admit: 2011-12-17 | Discharge: 2011-12-17 | Disposition: A | Discharge status: Home / Self Care | Payer: Medicare Other | Source: Ambulatory Visit | Attending: Surgery | Admitting: Surgery

## 2011-12-17 DIAGNOSIS — I2589 Other forms of chronic ischemic heart disease: Secondary | ICD-10-CM | POA: Insufficient documentation

## 2011-12-17 DIAGNOSIS — I2510 Atherosclerotic heart disease of native coronary artery without angina pectoris: Secondary | ICD-10-CM | POA: Insufficient documentation

## 2011-12-17 DIAGNOSIS — G35 Multiple sclerosis: Secondary | ICD-10-CM | POA: Diagnosis not present

## 2011-12-17 DIAGNOSIS — I70209 Unspecified atherosclerosis of native arteries of extremities, unspecified extremity: Secondary | ICD-10-CM | POA: Insufficient documentation

## 2011-12-17 DIAGNOSIS — F17200 Nicotine dependence, unspecified, uncomplicated: Secondary | ICD-10-CM | POA: Diagnosis not present

## 2011-12-17 DIAGNOSIS — E119 Type 2 diabetes mellitus without complications: Secondary | ICD-10-CM | POA: Diagnosis not present

## 2011-12-17 DIAGNOSIS — I5020 Unspecified systolic (congestive) heart failure: Secondary | ICD-10-CM | POA: Insufficient documentation

## 2011-12-17 DIAGNOSIS — E785 Hyperlipidemia, unspecified: Secondary | ICD-10-CM | POA: Insufficient documentation

## 2011-12-17 DIAGNOSIS — I509 Heart failure, unspecified: Secondary | ICD-10-CM | POA: Insufficient documentation

## 2011-12-17 DIAGNOSIS — I739 Peripheral vascular disease, unspecified: Secondary | ICD-10-CM

## 2011-12-17 HISTORY — PX: LOWER EXTREMITY ANGIOGRAM: SHX5508

## 2011-12-17 HISTORY — PX: ABDOMINAL ANGIOGRAM: SHX5499

## 2011-12-17 LAB — POCT I-STAT, CHEM 8
BUN: 21 mg/dL (ref 6–23)
Calcium, Ion: 1.24 mmol/L (ref 1.12–1.32)
Creatinine, Ser: 1.2 mg/dL (ref 0.50–1.35)
Glucose, Bld: 153 mg/dL — ABNORMAL HIGH (ref 70–99)
TCO2: 28 mmol/L (ref 0–100)

## 2011-12-17 SURGERY — ANGIOGRAM, LOWER EXTREMITY
Anesthesia: LOCAL | Laterality: Bilateral

## 2011-12-17 MED ORDER — ACETAMINOPHEN 325 MG PO TABS: 325.0000 mg | ORAL_TABLET | ORAL | Status: DC | PRN

## 2011-12-17 MED ORDER — ONDANSETRON HCL 4 MG/2ML IJ SOLN: 4.0000 mg | Freq: Four times a day (QID) | INTRAMUSCULAR | Status: DC | PRN

## 2011-12-17 MED ORDER — GUAIFENESIN-DM 100-10 MG/5ML PO SYRP: 15.0000 mL | ORAL_SOLUTION | ORAL | Status: DC | PRN

## 2011-12-17 MED ORDER — MIDAZOLAM HCL 2 MG/2ML IJ SOLN
INTRAMUSCULAR | Status: AC
  Filled 2011-12-17: qty 2

## 2011-12-17 MED ORDER — ALUM & MAG HYDROXIDE-SIMETH 200-200-20 MG/5ML PO SUSP: 15.0000 mL | ORAL | Status: DC | PRN

## 2011-12-17 MED ORDER — OXYCODONE-ACETAMINOPHEN 5-325 MG PO TABS: 1.0000 | ORAL_TABLET | ORAL | Status: DC | PRN

## 2011-12-17 MED ORDER — METOPROLOL TARTRATE 1 MG/ML IV SOLN: 2.0000 mg | INTRAVENOUS | Status: DC | PRN

## 2011-12-17 MED ORDER — LABETALOL HCL 5 MG/ML IV SOLN
INTRAVENOUS | Status: AC
  Filled 2011-12-17: qty 4

## 2011-12-17 MED ORDER — PHENOL 1.4 % MT LIQD: 1.0000 | OROMUCOSAL | Status: DC | PRN

## 2011-12-17 MED ORDER — HEPARIN (PORCINE) IN NACL 2-0.9 UNIT/ML-% IJ SOLN
INTRAMUSCULAR | Status: AC
  Filled 2011-12-17: qty 1000

## 2011-12-17 MED ORDER — SODIUM CHLORIDE 0.9 % IV SOLN: 1.0000 mL/kg/h | INTRAVENOUS | Status: DC

## 2011-12-17 MED ORDER — LABETALOL HCL 5 MG/ML IV SOLN
10.0000 mg | INTRAVENOUS | Status: DC | PRN
  Administered 2011-12-17: 10 mg via INTRAVENOUS

## 2011-12-17 MED ORDER — ACETAMINOPHEN 325 MG RE SUPP: 325.0000 mg | RECTAL | Status: DC | PRN

## 2011-12-17 MED ORDER — LABETALOL HCL 5 MG/ML IV SOLN
INTRAVENOUS | Status: AC
  Administered 2011-12-17: 10 mg via INTRAVENOUS
  Filled 2011-12-17: qty 4

## 2011-12-17 MED ORDER — HYDRALAZINE HCL 20 MG/ML IJ SOLN: 10.0000 mg | INTRAMUSCULAR | Status: DC | PRN

## 2011-12-17 MED ORDER — LIDOCAINE HCL (PF) 1 % IJ SOLN
INTRAMUSCULAR | Status: AC
  Filled 2011-12-17: qty 30

## 2011-12-17 MED ORDER — MORPHINE SULFATE 10 MG/ML IJ SOLN: 2.0000 mg | INTRAMUSCULAR | Status: DC | PRN

## 2011-12-17 MED ORDER — SODIUM CHLORIDE 0.9 % IV SOLN
INTRAVENOUS | Status: DC
  Administered 2011-12-17: 100 mL/h via INTRAVENOUS

## 2011-12-17 MED ORDER — FENTANYL CITRATE 0.05 MG/ML IJ SOLN
INTRAMUSCULAR | Status: AC
  Filled 2011-12-17: qty 2

## 2011-12-17 NOTE — Discharge Instructions (Signed)

## 2011-12-17 NOTE — Telephone Encounter (Signed)
Left message for patient with appt date and time. Asked for patient to call back and confirm appt.

## 2011-12-17 NOTE — H&P (View-Only) (Signed)
[facility] [place]  Referred by:  Wanda Plump, MD [phone] [facility] [facility] [address] [place], [place] [address]  Reason for referral: B leg pain  History of Present Illness  Jaime [name] is a 71 y.o. ([birth year]) male who presents with chief complaint: constant bilateral (L>R) leg pain.  Onset of symptom occurred 2 weeks ago.  Pain is described as aching, severity 3-6/10, and associated with rest.  Patient has severe MS and is wheelchair bound.  Patient has attempted to treat this pain with Vicodin.  The patient has rest pain symptoms also and no leg wounds/ulcers.  Atherosclerotic risk factors include: hyperlipidemia, DM, and smoking.  Pt's family believe Dr. Arbie Cookey might have placed a stent in L femoral artery previously.   Past Medical History  Diagnosis Date  . Hyperlipidemia   . Cardiomyopathy, ischemic     EF 45% per ECHO 2008  . Pulmonary hypertension     moderate ECHO Jan 2008  . CHF (congestive heart failure)   . Systolic heart failure   . Multiple sclerosis DX 1993    NEUROLOGIST-  DR DOHMEIER -- LAST VISIT 11-20-2010  NOTE W/ CHART  . Lung nodule     resolved 11-2006 CT Chest  . Tobacco abuse   . Increased prostate specific antigen (PSA) velocity   . Urinary retention     dx ~ 2-12, like from MS, now with a catheter, saw urology  . Ejection fraction      EF 45%, echo, 2008  . Cellulitis   . Pre-syncope     Near syncope, ER visit, October, 2011  . Carotid artery disease     Doppler, February, 2012, 0-39% bilateral,Mild smooth plaque  . Chronic indwelling foley catheter   . Scoliosis associated with other condition   . Hemiparesis   . Fatigue SEVERE  . Weakness   . Impotence   . MI, acute, non ST segment elevation 2007    S/P PCI WITH X1 STENT (TAXUS DRUG-ELUTING) LEFT CIRCUMFLEX  . Echocardiogram abnormal 08-17-2006    SEVERE HYPOKINESIS OF INFERIOR , POSTEROR WALLS OF BASE AND MID VENTRICLE AND MOD. PULMONARY HTN  . Diabetes mellitus      INSULIN-DEPENDANT  . CAD (coronary artery disease) CARDIOLOGIST- DR KATZ-- VISIT 06-05-2011 IN [facility]    non-STEMI, 2007.Marland Kitchenoccluded circumflex.. Taxus stent placed...residual 80% LAD...50% RCA  . Hypertension   . H/O pleural effusion 2008    POST THORACENTESIS  . History of colon polyps PRECANCEROUS  . Insomnia   . Ingrown right big toenail CHRONIC    PER PT VERY SORE    Past Surgical History  Procedure Date  . Hernia repair     (R)  . Coronary angioplasty with stent placement 05-01-2006    OCCLUDED CIRCUMFLEX -- TAXUS STENT PLACMENT  AND RESIDUAL 80% LAD,  50% RCA  . Thoracentesis 2008    PLEURAL EFFUSION  . Cystoscopy 07/30/2011    Procedure: CYSTOSCOPY;  Surgeon: Lindaann Slough, MD;  Location: [facility];  Service: Urology;  Laterality: N/A;  . Transurethral resection of prostate 07/30/2011    Procedure: TRANSURETHRAL RESECTION OF THE PROSTATE (TURP);  Surgeon: Lindaann Slough, MD;  Location: [facility];  Service: Urology;  Laterality: N/A;    History   Social History  . Marital Status: Married    Spouse Name: N/A    Number of Children: 2  . Years of Education: N/A   Occupational History  . disable  Social History Main Topics  . Smoking status: Current Some Day Smoker -- 50 years    Types: Cigarettes  . Smokeless tobacco: Never Used   Comment: 1 PPWEEK  . Alcohol Use: No  . Drug Use: Not on file  . Sexually Active: Not on file   Other Topics Concern  . Not on file   Social History Narrative   Lives w/ wife    Family History  Problem Relation Age of Onset  . Hypertension    . Heart attack Mother 38  . Heart disease Mother   . Stroke Mother   . Colon cancer Neg Hx   . Prostate cancer Neg Hx   . COPD Father   . Peripheral vascular disease Father     Current Outpatient Prescriptions on File Prior to Visit  Medication Sig Dispense Refill  . acetaminophen (TYLENOL) 500 MG tablet Take 1,000 mg by mouth every 6 (six) hours  as needed. For pain      . amantadine (SYMMETREL) 100 MG capsule Take 200 mg by mouth 2 (two) times daily. By urologist      . B-D ULTRAFINE III SHORT PEN 31G X 8 MM MISC       . diclofenac sodium (VOLTAREN) 1 % GEL Apply 1 application topically 4 (four) times daily as needed. For pain      . HYDROcodone-acetaminophen (NORCO) 5-325 MG per tablet Take 1 tablet by mouth every 6 (six) hours as needed. For pain      . HYDROcodone-acetaminophen (NORCO) 5-325 MG per tablet Take 1 tablet by mouth every 4 (four) hours as needed for pain.  6 tablet  0  . insulin aspart protamine-insulin aspart (NOVOLOG 70/30) (70-30) 100 UNIT/ML injection Inject 10-30 Units into the skin 2 (two) times daily. 30 units at breakfast and 10 units at 10pm      . simvastatin (ZOCOR) 40 MG tablet Take 1 tablet (40 mg total) by mouth at bedtime.  90 tablet  1  . neomycin-bacitracin-polymyxin (NEOSPORIN) ointment Apply 1 application topically every 12 (twelve) hours.        Allergies  Allergen Reactions  . Bee Venom Anaphylaxis     Review of Systems (Positive items checked otherwise negative)  General: [ ]  Weight loss, [ ]  Weight gain, [ ]   Loss of appetite, [ ]  Fever  Neurologic: [ ]  Dizziness, [ ]  Blackouts, [ ]  Headaches, [ ]  Seizure, [x]  MS  Ear/Nose/Throat: [ ]  Change in eyesight, [ ]  Change in hearing, [ ]  Nose bleeds, [ ]  Sore throat  Vascular: [ ]  Pain in legs with walking, [x]  Pain in feet while lying flat, [ ]  Non-healing ulcer, []  Stroke, [ ]  "Mini stroke", [ ]  Slurred speech, [ ]  Temporary blindness, [ ]  Blood clot in vein, [ ]  Phlebitis  Pulmonary: [ ]  Home oxygen, [ ]  Productive cough, [ ]  Bronchitis, [ ]  Coughing up blood, [ ]  Asthma, [ ]  Wheezing  Musculoskeletal: [ ]  Arthritis, [ ]  Joint pain, [ ]  Muscle pain  Cardiac: [ ]  Chest pain, [ ]  Chest tightness/pressure, [ ]  Shortness of breath when lying flat, [ ]  Shortness of breath with exertion, [ ]  Palpitations, [ ]  Heart murmur, [ ]  Arrythmia,  [ ]   Atrial fibrillation  Hematologic: [ ]  Bleeding problems, [ ]  Clotting disorder, [ ]  Anemia  Psychiatric:  [ ]  Depression, [ ]  Anxiety, [ ]  Attention deficit disorder  Gastrointestinal:  [ ]  Black stool,[ ]   Blood in stool, [ ]  Peptic  ulcer disease, [ ]  Reflux, [ ]  Hiatal hernia, [ ]  Trouble swallowing, [ ]  Diarrhea, [ ]  Constipation  Urinary:  [ ]  Kidney disease, [ ]  Burning with urination, [ ]  Frequent urination, [ ]  Difficulty urinating  Skin: [ ]  Ulcers, [ ]  Rashes   Physical Examination  Filed Vitals:   12/13/11 1546  BP: 117/83  Pulse: 74  Resp: 20  Height: 5\' 11"  (1.803 m)  Weight: 140 lb (63.504 kg)   Body mass index is 19.53 kg/(m^2).  General: A&O x 3, appear debilitated in wheelchair  Head: [place]/AT  Ear/Nose/Throat: Hearing grossly intact, nares w/o erythema or drainage, oropharynx w/o Erythema/Exudate  Eyes: PERRLA, EOMI  Neck: Supple, no nuchal rigidity, no palpable LAD  Pulmonary: Sym exp, good air movt, CTAB, no rales, rhonchi, & wheezing  Cardiac: RRR, Nl S1, S2, no Murmurs, rubs or gallops  Vascular: Vessel Right Left  Radial Palpable Palpable  Brachial Palpable Palpable  Carotid Palpable, without bruit Palpable, without bruit  Aorta Non-palpable N/A  Femoral Palpable Palpable  Popliteal Non-palpable Non-palpable  PT Palpable Palpable  DP Palpable Palpable   Gastrointestinal: soft, NTND, -G/R, - HSM, - masses, - CVAT B  Musculoskeletal: RUE 3-4/5, LUE 4/5, RLE 2-3/5, LLE 3-4/5 , Extremities without ischemic changes , Both feet appear viable, Both leg cool from distal thigh down  Neurologic: CN 2-12 intact , Pain and light touch intact in extremities   Psychiatric: Judgment intact, Mood & affect appropriate for pt's clinical situation  Dermatologic: See M/S exam for extremity exam, no rashes otherwise noted  Lymph : No Cervical, Axillary, or Inguinal lymphadenopathy   Outside Studies/Documentation 10 pages of outside documents were reviewed  including: inpatient L venous duplex: neg, BLE ABI: severe to critical PAD  Medical Decision Making  Jaime [name] is a 71 y.o. male who presents with: BLE critical limb ischemia (L>R)   I discussed with the patient the natural history of critical limb ischemia: 25% require amputation in one year, 50% are able to maintain their limbs in one year, and 25-30% die in one year due to comorbidities.  Given the limb threatening status of this patient, I recommend an aggressive work up including proceeding with an: Aortogram, Bilateral runoff and intervention. I discussed with the patient the nature of angiographic procedures, especially the limited patencies of any endovascular intervention. The patient is aware of that the risks of an angiographic procedure include but are not limited to: bleeding, infection, access site complications, embolization, rupture of treated vessel, dissection, possible need for emergent surgical intervention, and possible need for surgical procedures to treat the patient's pathology. The patient is aware of the risks and agrees to proceed.  The procedure is scheduled for: Aortogram, bilateral leg runoff, possible left leg intervention with Dr. Myra Gianotti on  28 MAY 13.  I discussed in depth with the patient the nature of atherosclerosis, and emphasized the importance of maximal medical management including strict control of blood pressure, blood glucose, and lipid levels, antiplatelet agents, obtaining regular exercise, and cessation of smoking.  The patient is aware that without maximal medical management the underlying atherosclerotic disease process will progress, limiting the benefit of any interventions.  Thank you for allowing [place] to participate in this patient's care.  Leonides Sake, MD [facility] [place] Office: [phone] Pager: [phone]  12/13/2011, 5:46 PM

## 2011-12-17 NOTE — Op Note (Signed)
[facility] [place]  Patient name: Jaime [name] MRN: [id number] DOB: [birth year] Sex: male  12/17/2011 Pre-operative Diagnosis: Peripheral vascular disease, left greater than right Post-operative diagnosis:  Same Surgeon:  Jorge Ny Procedure Performed:  1.  ultrasound access right femoral artery  2.  abdominal aortogram  3.  bilateral lower extremity runoff  4.  second order catheterization   Indications:  The patient has had significant pain in both lower extremities, left greater than right for approximately 2 weeks. He comes in today for further evaluation and possible intervention  Procedure:  The patient was identified in the holding area and taken to room 8.  The patient was then placed supine on the table and prepped and draped in the usual sterile fashion.  A time out was called.  Ultrasound was used to evaluate the right common femoral artery.  It was patent .  A digital ultrasound image was acquired.  A 18-gauge needle was used to access the right common femoral artery under ultrasound guidance.  An 035 wire was advanced without resistance and a 5 French sheath was placed.  An omniflush catheter was advanced over the wire to the level of L-1.  An abdominal angiogram was obtained.  Next, using the omniflush catheter and a benson wire and a Glidewire, the aortic bifurcation was crossed and the catheter was placed into theleft external iliac artery and left runoff was obtained.  right runoff was performed via retrograde sheath injections.  Findings:   Aortogram:  The visualized portions of the suprarenal abdominal aorta showed no significant disease. There is no evidence of renal artery stenosis. The infrarenal abdominal aorta is patent without significant stenosis. Bilateral common and external iliac arteries are patent but calcified without hemodynamically significant stenosis. Diffuse disease is seen throughout both hypogastric arteries especially in  the branches.  Right Lower Extremity:  The right common femoral artery is patent but calcified. The superficial femoral artery is occluded at its origin. The profunda branches are patent but irregular and calcified. No significant stenosis is identified there is reconstitution of the above-knee popliteal artery which is patent down below the knee however, all 3 tibial vessels are occluded. There is delayed contrast opacification of the posterior tibial artery behind the ankle. The 2 proximal disease evaluation of blood the lateral to the foot could not be obtained  Left Lower Extremity:  The left common femoral artery is patent. There is a flush occlusion of the left superficial femoral artery. The profunda femoral artery is small in caliber but patent. There is reconstitution of the peroneal artery which is patent down to the ankle and does collateralize out onto the foot.  Intervention:  None Impression:  #1  no significant aortoiliac occlusive disease, however the vessels are calcified.  #2  flush occlusion of the left superficial femoral artery with single-vessel runoff via the peroneal artery  #3  right superficial femoral artery occlusion with reconstitution of the popliteal artery above the knee however all 3 tibial vessels are occluded. There is a ghosting in of the posterior tibial artery at the ankle. It is not well visualized out onto the foot   V. Durene Cal, M.D. [facility] [place] Office: [phone] Pager:  [phone]

## 2011-12-17 NOTE — Interval H&P Note (Signed)
History and Physical Interval Note:  12/17/2011 2:38 PM  Jaime [name]  has presented today for surgery, with the diagnosis of pvd  The various methods of treatment have been discussed with the patient and family. After consideration of risks, benefits and other options for treatment, the patient has consented to  Procedure(s) (LRB): LOWER EXTREMITY ANGIOGRAM (N/A) as a surgical intervention .  The patients' history has been reviewed, patient examined, no change in status, stable for surgery.  I have reviewed the patients' chart and labs.  Questions were answered to the patient's satisfaction.     Saida Lonon IV, V. WELLS

## 2011-12-17 NOTE — Progress Notes (Signed)
UP TO WHEELCHAIR AND TOL WELL RIGHT GROIN STABLE; NOT BLEEDING OR HEMATOMA

## 2011-12-17 NOTE — Progress Notes (Signed)
Pts BP was 187/115.  As per order pt was given 10 mg of lebatlol.  Will continue to monitor closely

## 2011-12-17 NOTE — Telephone Encounter (Signed)
Message copied by Sara Chu on Tue Dec 17, 2011  5:01 PM ------      Message from: Melene Plan      Created: Tue Dec 17, 2011  3:49 PM                   ----- Message -----         From: Nada Libman, MD         Sent: 12/17/2011   3:10 PM           To: Almetta Lovely, RN            12/17/2011, the patient upon procedures:             1.  ultrasound access right femoral artery       2.  abdominal aortogram       3.  bilateral lower extremity runoff       4.  second order catheterization                  Please schedule the patient to come back to see Dr. Imogene Burn on Friday June 7.  He will need a lower extremity vein mapping prior to his visit

## 2011-12-18 ENCOUNTER — Other Ambulatory Visit: Payer: Self-pay | Admitting: *Deleted

## 2011-12-18 DIAGNOSIS — I70219 Atherosclerosis of native arteries of extremities with intermittent claudication, unspecified extremity: Secondary | ICD-10-CM

## 2011-12-23 DIAGNOSIS — E119 Type 2 diabetes mellitus without complications: Secondary | ICD-10-CM | POA: Diagnosis not present

## 2011-12-23 DIAGNOSIS — H2510 Age-related nuclear cataract, unspecified eye: Secondary | ICD-10-CM | POA: Diagnosis not present

## 2011-12-26 ENCOUNTER — Encounter: Payer: Self-pay | Admitting: Vascular Surgery

## 2011-12-27 ENCOUNTER — Ambulatory Visit (INDEPENDENT_AMBULATORY_CARE_PROVIDER_SITE_OTHER): Payer: Medicare Other | Admitting: Vascular Surgery

## 2011-12-27 ENCOUNTER — Encounter (INDEPENDENT_AMBULATORY_CARE_PROVIDER_SITE_OTHER): Payer: Medicare Other | Admitting: *Deleted

## 2011-12-27 ENCOUNTER — Encounter: Payer: Self-pay | Admitting: Vascular Surgery

## 2011-12-27 VITALS — BP 160/92 | HR 70 | Resp 14 | Ht 71.0 in | Wt 140.0 lb

## 2011-12-27 DIAGNOSIS — I70219 Atherosclerosis of native arteries of extremities with intermittent claudication, unspecified extremity: Secondary | ICD-10-CM | POA: Insufficient documentation

## 2011-12-27 DIAGNOSIS — I998 Other disorder of circulatory system: Secondary | ICD-10-CM

## 2011-12-27 DIAGNOSIS — I999 Unspecified disorder of circulatory system: Secondary | ICD-10-CM

## 2011-12-27 NOTE — Progress Notes (Addendum)
[facility] [place]  Postoperative Visit  History of Present Illness  Jaime [name] is a 71 y.o. male who presents for postoperative follow-up for: Aortogram and B leg ruoff (Date: 12/17/11).  The patient notes his left foot sx have improved and he has intermittent rest pain in both limbs.  He is wheelchair bound by his MS.  He denies any new wounds and any complications from the angiogram.  Past Medical History, Past Surgical History, Social History, Family History, Medications, Allergies, and Review of Systems are unchanged from previous evaluation on 12/13/11.  Physical Examination  Filed Vitals:   12/27/11 1037  BP: 160/92  Pulse: 70  Resp: 14  Height: 5\' 11"  (1.803 m)  Weight: 140 lb (63.504 kg)  SpO2: 100%   General: A&O x 3, appear debilitated in wheelchair   Pulmonary: Sym exp, good air movt, CTAB, no rales, rhonchi, & wheezing   Cardiac: RRR, Nl S1, S2, no Murmurs, rubs or gallops   Vascular:  Vessel  Right  Left   Radial  Palpable  Palpable   Brachial  Palpable  Palpable   Carotid  Palpable, without bruit  Palpable, without bruit   Aorta  Non-palpable  N/A   Femoral  Palpable  Palpable   Popliteal  Non-palpable  Non-palpable   PT  Non-Palpable  Non-Palpable   DP  Non-Palpable  Non-Palpable    Gastrointestinal: soft, NTND, -G/R, - HSM, - masses, - CVAT B   Musculoskeletal: RUE 3-4/5, LUE 4/5, RLE 2-3/5, LLE 3-4/5 , Extremities without ischemic changes , Both feet appear viable, Both leg cool from distal thigh down   Neurologic: CN 2-12 intact , Pain and light touch intact in extremities   BLE Vein Mapping (Date: 12/27/11)  RLE: inadequate conduit  LLE: inadequate conduit  Medical Decision Making  Jaime [name] is a 71 y.o. 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  The  patient agrees to forgo any bypass operation at this time. At this point, he can tolerate his sx and he has no gangrene or wounds to heal, so non-operative management is acceptable. I discussed in depth with the patient the nature of atherosclerosis, and emphasized the importance of maximal medical management including strict control of blood pressure, blood glucose, and lipid levels, obtaining regular exercise, and cessation of smoking.  The patient is aware that without maximal medical management the underlying atherosclerotic disease process will progress, limiting the benefit of any interventions.  Thank you for allowing [place] to participate in this patient's care.  Leonides Sake, MD [facility] [place] Office: [phone] Pager: [phone]

## 2012-01-03 NOTE — Procedures (Unsigned)
VASCULAR LAB EXAM  INDICATION:  Atherosclerosis with claudication  HISTORY: Diabetes: Cardiac: Hypertension:  EXAM:  Bilateral lower extremity vein mapping.  IMPRESSION: 1. The right great saphenous vein is compressible with diameter     measurements ranging from 0.21 to 0.38 cm. 2. The right short saphenous vein is compressible with diameter     measurements ranging from 0.30 to 0.37 cm. 3. The left great saphenous vein is compressible with diameter     measurements ranging from 0.19 to 0.33 cm. 4. The left short saphenous vein is compressible with diameter     measurements ranging from 0.30 to 0.37 cm. 5. Measurements are also noted on attached worksheet.  ___________________________________________ Fransisco Hertz, MD  CH/MEDQ  D:  12/30/2011  T:  12/30/2011  Job:  [id number]

## 2012-01-09 ENCOUNTER — Other Ambulatory Visit: Payer: Self-pay

## 2012-01-09 ENCOUNTER — Telehealth: Payer: Self-pay

## 2012-01-09 ENCOUNTER — Other Ambulatory Visit: Payer: Self-pay | Admitting: *Deleted

## 2012-01-09 DIAGNOSIS — I739 Peripheral vascular disease, unspecified: Secondary | ICD-10-CM

## 2012-01-09 MED ORDER — HYDROCODONE-ACETAMINOPHEN 5-500 MG PO TABS: 1.0000 | ORAL_TABLET | Freq: Four times a day (QID) | ORAL | Status: AC | PRN

## 2012-01-09 NOTE — Telephone Encounter (Signed)
Rec'd new orders from Dr. Imogene Burn re: pain medication refill and referral to pain management.  See orders below.

## 2012-01-09 NOTE — Telephone Encounter (Signed)
Message copied by Phillips Odor on Thu Jan 09, 2012  3:52 PM ------      Message from: Fransisco Hertz      Created: Thu Jan 09, 2012  1:36 PM      Regarding: RE: requesting refill of pain medication       #50 of vicodin and needs refer to chronic pain management            ----- Message -----         From: Erenest Blank, RN         Sent: 01/09/2012  12:27 PM           To: Fransisco Hertz, MD      Subject: requesting refill of pain medication                     Pt's. Wife called with request to refill Oxycodone for his leg pain.  You saw him 12/27/11 following angiogram of 5/28; wife stated she was led to believe we were to refill his pain medication, since he is not a candidate for surgery.  Do you want me to refill oxycodone or give Rx for Hydrocodone, either one?

## 2012-01-17 ENCOUNTER — Telehealth: Payer: Self-pay | Admitting: Vascular Surgery

## 2012-01-27 ENCOUNTER — Ambulatory Visit (HOSPITAL_BASED_OUTPATIENT_CLINIC_OR_DEPARTMENT_OTHER): Payer: Medicare Other | Admitting: Physical Medicine & Rehabilitation

## 2012-01-27 ENCOUNTER — Encounter: Payer: Medicare Other | Attending: Physical Medicine & Rehabilitation

## 2012-01-27 ENCOUNTER — Encounter: Payer: Self-pay | Admitting: Physical Medicine & Rehabilitation

## 2012-01-27 VITALS — BP 129/70 | HR 63 | Resp 14 | Ht 71.0 in | Wt 140.0 lb

## 2012-01-27 DIAGNOSIS — G8220 Paraplegia, unspecified: Secondary | ICD-10-CM | POA: Diagnosis not present

## 2012-01-27 DIAGNOSIS — M25579 Pain in unspecified ankle and joints of unspecified foot: Secondary | ICD-10-CM

## 2012-01-27 DIAGNOSIS — M79609 Pain in unspecified limb: Secondary | ICD-10-CM | POA: Diagnosis not present

## 2012-01-27 DIAGNOSIS — M21549 Acquired clubfoot, unspecified foot: Secondary | ICD-10-CM | POA: Diagnosis not present

## 2012-01-27 DIAGNOSIS — G35 Multiple sclerosis: Secondary | ICD-10-CM | POA: Insufficient documentation

## 2012-01-27 DIAGNOSIS — I998 Other disorder of circulatory system: Secondary | ICD-10-CM

## 2012-01-27 DIAGNOSIS — I999 Unspecified disorder of circulatory system: Secondary | ICD-10-CM | POA: Diagnosis not present

## 2012-01-27 MED ORDER — GABAPENTIN 100 MG PO CAPS: 100.0000 mg | ORAL_CAPSULE | Freq: Three times a day (TID) | ORAL | Status: DC

## 2012-01-27 MED ORDER — DICLOFENAC SODIUM 1 % TD GEL: 1.0000 "application " | Freq: Four times a day (QID) | TRANSDERMAL | Status: DC | PRN

## 2012-01-27 NOTE — Patient Instructions (Signed)
He'll see me back in 6 weeks Start gabapentin for your left foot and ankle pain Continue Voltaren gel 4 times per day

## 2012-01-27 NOTE — Progress Notes (Signed)
Subjective:    Patient ID: Jaime [name], male    DOB: October 01, 1940, 71 y.o.   MRN: [id number]  HPI Last visit in August of 2012. Has a history of multiple sclerosis with paraplegia. He is in a power chair. He is seen by vascular surgery for peripheral vascular disease. He has had good relief of toe pain with diclofenac gel and is running out of this. He is not using it more than 2 or 3 times per day. Has received some hydrocodone or oxycodone from vascular surgery. Pain Inventory Average Pain 10 Pain Right Now 3 My pain is sharp  In the last 24 hours, has pain interfered with the following? General activity 1 Relation with others 4 Enjoyment of life 2 What TIME of day is your pain at its worst? morning Sleep (in general) Fair  Pain is worse with: inactivity Pain improves with: medication Relief from Meds: 3  Mobility how many minutes can you walk? 0 ability to climb steps?  no do you drive?  no use a wheelchair needs help with transfers  Function retired I need assistance with the following:  dressing, bathing, toileting and meal prep  Neuro/Psych bladder control problems bowel control problems weakness  Prior Studies Any changes since last visit?  no  Physicians involved in your care Any changes since last visit?  no   Family History  Problem Relation Age of Onset  . Hypertension    . Heart attack Mother 76  . Heart disease Mother   . Stroke Mother   . Hyperlipidemia Mother   . Hypertension Mother   . Colon cancer Neg Hx   . Prostate cancer Neg Hx   . COPD Father   . Peripheral vascular disease Father   . Diabetes Brother   . Heart disease Brother   . Hypertension Brother   . Heart attack Brother   . Diabetes Daughter    History   Social History  . Marital Status: Married    Spouse Name: N/A    Number of Children: 2  . Years of Education: N/A   Occupational History  . disable    Social History Main Topics  . Smoking status: Former Smoker  -- 50 years    Types: Cigarettes  . Smokeless tobacco: Never Used   Comment: 1 PPWEEK  . Alcohol Use: No  . Drug Use: None  . Sexually Active: None   Other Topics Concern  . None   Social History Narrative   Lives w/ wife   Past Surgical History  Procedure Date  . Hernia repair 1990    (R)  . Coronary angioplasty with stent placement 05-01-2006    OCCLUDED CIRCUMFLEX -- TAXUS STENT PLACMENT  AND RESIDUAL 80% LAD,  50% RCA  . Thoracentesis 2008    PLEURAL EFFUSION  . Cystoscopy 07/30/2011    Procedure: CYSTOSCOPY;  Surgeon: Lindaann Slough, MD;  Location[facility];  Service: Urology;  Laterality: N/A;  . Transurethral resection of prostate 07/30/2011    Procedure: TRANSURETHRAL RESECTION OF THE PROSTATE (TURP);  Surgeon: Lindaann Slough, MD;  Location: [facility];  Service: Urology;  Laterality: N/A;   Past Medical History  Diagnosis Date  . Hyperlipidemia   . Cardiomyopathy, ischemic     EF 45% per ECHO 2008  . Pulmonary hypertension     moderate ECHO Jan 2008  . CHF (congestive heart failure)   . Systolic heart failure   . Multiple sclerosis DX 1993  NEUROLOGIST-  DR DOHMEIER -- LAST VISIT 11-20-2010  NOTE W/ CHART  . Lung nodule     resolved 11-2006 CT Chest  . Tobacco abuse   . Increased prostate specific antigen (PSA) velocity   . Urinary retention     dx ~ 2-12, like from MS, now with a catheter, saw urology  . Ejection fraction      EF 45%, echo, 2008  . Cellulitis   . Pre-syncope     Near syncope, ER visit, October, 2011  . Carotid artery disease     Doppler, February, 2012, 0-39% bilateral,Mild smooth plaque  . Chronic indwelling foley catheter   . Scoliosis associated with other condition   . Hemiparesis   . Fatigue SEVERE  . Weakness   . Impotence   . MI, acute, non ST segment elevation 2007    S/P PCI WITH X1 STENT (TAXUS DRUG-ELUTING) LEFT CIRCUMFLEX  . Echocardiogram abnormal 08-17-2006    SEVERE HYPOKINESIS OF  INFERIOR , POSTEROR WALLS OF BASE AND MID VENTRICLE AND MOD. PULMONARY HTN  . Diabetes mellitus     INSULIN-DEPENDANT  . CAD (coronary artery disease) CARDIOLOGIST- DR KATZ-- VISIT 06-05-2011 IN [facility]    non-STEMI, 2007.Marland Kitchenoccluded circumflex.. Taxus stent placed...residual 80% LAD...50% RCA  . Hypertension   . H/O pleural effusion 2008    POST THORACENTESIS  . History of colon polyps PRECANCEROUS  . Insomnia   . Ingrown right big toenail CHRONIC    PER PT VERY SORE   BP 129/70  Pulse 63  Resp 14  Ht 5\' 11"  (1.803 m)  Wt 140 lb (63.504 kg)  BMI 19.53 kg/m2  SpO2 96%    Review of Systems  Musculoskeletal: Positive for arthralgias.  Neurological: Positive for weakness.  All other systems reviewed and are negative.       Objective:   Physical Exam  Constitutional: He is oriented to person, place, and time. He appears well-developed.  Musculoskeletal:       Left foot: He exhibits decreased range of motion and deformity. He exhibits no tenderness, no bony tenderness, no swelling and no crepitus.       Equinovalgus deformity Decreased pedal pulse and posterior tibial pulse in the left lower extremity Toes mildly cold no obvious cyanosis  Neurological: He is alert and oriented to person, place, and time. A sensory deficit is present. He exhibits abnormal muscle tone. Coordination and gait abnormal.       Reduced sensation to light touch in the left lower extremity. No evidence of skin breakdown in the left foot or ankle   Psychiatric: He has a normal mood and affect.          Assessment & Plan:  1. Left foot and ankle pain. This is likely multi-factorial. 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 In addition he has peripheral vascular disease and may have some ischemic pain. Given that he is nonambulatory he does not have claudication symptoms. 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 Will continue Voltaren gel but increased to 4 times a day 2 gabapentin 100 mg 3 times a day may need to slowly increase over time We'll see him back in 6 weeks. Will avoid narcotic analgesics do to his fall risk

## 2012-02-03 ENCOUNTER — Telehealth: Payer: Self-pay | Admitting: Physical Medicine & Rehabilitation

## 2012-02-03 ENCOUNTER — Ambulatory Visit (INDEPENDENT_AMBULATORY_CARE_PROVIDER_SITE_OTHER): Payer: Medicare Other | Admitting: Internal Medicine

## 2012-02-03 VITALS — BP 118/76 | HR 73 | Temp 97.8°F

## 2012-02-03 DIAGNOSIS — I999 Unspecified disorder of circulatory system: Secondary | ICD-10-CM | POA: Diagnosis not present

## 2012-02-03 DIAGNOSIS — I998 Other disorder of circulatory system: Secondary | ICD-10-CM

## 2012-02-03 MED ORDER — GABAPENTIN 300 MG PO CAPS: 300.0000 mg | ORAL_CAPSULE | Freq: Three times a day (TID) | ORAL | Status: DC

## 2012-02-03 MED ORDER — MELOXICAM 15 MG PO TABS: 15.0000 mg | ORAL_TABLET | Freq: Every day | ORAL | Status: DC

## 2012-02-03 NOTE — Telephone Encounter (Signed)
Could prescribe Mobic 15mg /day, if he can tolerate anti inflammatories

## 2012-02-03 NOTE — Telephone Encounter (Signed)
Please advise. In Dr. Wynn Banker last note he stated that he didn't want to give pt narcotics due to fall risk.

## 2012-02-03 NOTE — Assessment & Plan Note (Addendum)
Patient was recently diagnosed with a critical lower limb ischemia, not a surgical candidate. He is having a lot of pain and probably developing gangrene of the third left toe. Plan: Refer back to Dr. Imogene Burn, last visit 6 weeks ago, at the time he had no findings consistent with gangrene. Pain control --> increase gabapentin dose, further medication by pain management although I will consider prescribe Vicodin if needed for pain Patient will call if his see discharge, redness, swelling

## 2012-02-03 NOTE — Telephone Encounter (Signed)
LM with wife letting her know that rx has been sent in.

## 2012-02-03 NOTE — Telephone Encounter (Signed)
In severe pain.  Neurontin ot strong enough.  Please call ASAP

## 2012-02-03 NOTE — Progress Notes (Signed)
  Subjective:    Patient ID: Jaime [name], male    DOB: 25-Jul-1940, 71 y.o.   MRN: [id number]  HPI Routine office visit Since the last office visit, he was diagnosed with   critical lower extremity ischemia, not a surgical candidate. He continue to have pain of the left foot, was referred to a pain management, they are treating him with voltaren and Neurontin.  Past Medical History:   MS   DM   Hyperlipidemia Pulmonary hypertension moderate.... echo.... January, 2008  Ischemic cardiomyopathy   CAD non-STEMI.. 2007.Marland Kitchen occluded circumflex... Taxus stent placed.... residual 80% LAD.Marland Kitchen 50% RCA  Peripheral vascular disease -- critical lower extremity stenosis, not surgical candidate   BPH   Lung nodule-- resolved per 11-2006 CT chest    Past Surgical History:   TURP, Dr Nessi---> 07-30-11   hernia repair (R)    Social History:   Married, children x 2   Current Smoker, has cut down " 1 pack a month"   Alcohol use-no    Review of Systems Medication list reviewed, good compliance with medicines as prescribed. He's not taken any narcotic at present. Reportedly he saw endocrinology recently, diabetes under excellent control.     Objective:   Physical Exam Alert oriented x3, wheelchair-bound. Left foot: all  toes are dark and cold, specifically the third toe looks the worse, it easily bled once I manipulated the toenail. I see no redness,  redness, discharge or odor.    Assessment & Plan:

## 2012-02-04 ENCOUNTER — Telehealth: Payer: Self-pay | Admitting: Vascular Surgery

## 2012-02-04 NOTE — Telephone Encounter (Signed)
Good morning ladies, Patient's PCP, Dr. Willow Ora, believes this patient may developing Gangrene, and is requesting for patient to be seen this week. Please call Renee at [phone].  Appointment scheduled for patient to see Dr. Imogene Burn 02/07/2012 @ 11:30am.  A detailed voicemail was left for pt at [phone]. Juliette Alcide

## 2012-02-05 ENCOUNTER — Encounter: Payer: Self-pay | Admitting: Internal Medicine

## 2012-02-06 ENCOUNTER — Encounter: Payer: Self-pay | Admitting: Vascular Surgery

## 2012-02-07 ENCOUNTER — Telehealth: Payer: Self-pay | Admitting: Vascular Surgery

## 2012-02-07 ENCOUNTER — Ambulatory Visit (INDEPENDENT_AMBULATORY_CARE_PROVIDER_SITE_OTHER): Payer: Medicare Other | Admitting: Vascular Surgery

## 2012-02-07 ENCOUNTER — Encounter: Payer: Self-pay | Admitting: Vascular Surgery

## 2012-02-07 VITALS — BP 116/87 | HR 72 | Resp 18 | Ht 71.0 in | Wt 140.0 lb

## 2012-02-07 DIAGNOSIS — L98499 Non-pressure chronic ulcer of skin of other sites with unspecified severity: Secondary | ICD-10-CM

## 2012-02-07 DIAGNOSIS — I998 Other disorder of circulatory system: Secondary | ICD-10-CM

## 2012-02-07 DIAGNOSIS — I999 Unspecified disorder of circulatory system: Secondary | ICD-10-CM

## 2012-02-07 DIAGNOSIS — I70209 Unspecified atherosclerosis of native arteries of extremities, unspecified extremity: Secondary | ICD-10-CM

## 2012-02-07 DIAGNOSIS — I739 Peripheral vascular disease, unspecified: Secondary | ICD-10-CM

## 2012-02-07 DIAGNOSIS — Z01818 Encounter for other preprocedural examination: Secondary | ICD-10-CM

## 2012-02-07 NOTE — Progress Notes (Signed)
[facility] [place]  Postoperative Visit  History of Present Illness  Jaime [name] is a 71 y.o. male who presents for cc: left foot drainage.  By report, over the last few weeks, the patient has begun draining between toes.  He denies any fever or chills.  He has developed no rest pain in the left leg.  He notes the pain is severe at time.  At this point, he would like consideration for surgical bypass.  Past Medical History, Past Surgical History, Social History, Family History, Medications, Allergies, and Review of Systems are unchanged from previous evaluation on 12/27/11.  Physical Examination  Filed Vitals:   02/07/12 1338  BP: 116/87  Pulse: 72  Resp: 18  Height: 5\' 11"  (1.803 m)  Weight: 140 lb (63.504 kg)   General: A&O x 3, appear debilitated in wheelchair   Pulmonary: Sym exp, good air movt, CTAB, no rales, rhonchi, & wheezing   Cardiac: RRR, Nl S1, S2, no Murmurs, rubs or gallops   Vascular:  Vessel  Right  Left   Radial  Palpable  Palpable   Brachial  Palpable  Palpable   Carotid  Palpable, without bruit  Palpable, without bruit   Aorta  Non-palpable  N/A   Femoral  Palpable  Palpable   Popliteal  Non-palpable  Non-palpable   PT  Non-Palpable  Non-Palpable   DP  Non-Palpable  Non-Palpable    Gastrointestinal: soft, NTND, -G/R, - HSM, - masses, - CVAT B   Musculoskeletal: RUE 3-4/5, LUE 4/5, RLE 2-3/5, LLE 3-4/5 , Extremities without ischemic changes , Both feet appear viable, Both leg cool from distal thigh down , Skin breakdown between 3rd and 4th toes with some serous drainage  Neurologic: CN 2-12 intact , Pain and light touch intact in extremities   Medical Decision Making   Jaime [name] is a 71 y.o. 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  The patient would like consideration for bypass options to try to help with pain control. We  briefly discussed a L AKA but he is reluctant to consider such currently. I gave the family wound care instructions:  Wash left foot twice a day and applied neosporin to the area of skin breakdown twice a day. I have arranged for him to see Dr. Myrtis Ser for pre-operative cardiology risk stratification and optimization.   I suspect this patient's only option is a composite conduit in the left leg: Propaten with vein segment. I discussed in depth with the patient the nature of atherosclerosis, and emphasized the importance of maximal medical management including strict control of blood pressure, blood glucose, and lipid levels, obtaining regular exercise, and cessation of smoking. The patient is aware that without maximal medical management the underlying atherosclerotic disease process will progress, limiting the benefit of any interventions. Thank you for allowing [place] to participate in this patient's care.  Jaime Sake, MD  [facility] [place]  Office: [phone]  Pager: [phone]

## 2012-02-07 NOTE — Telephone Encounter (Signed)
Spoke w/ pt's wife Jaime [name] regarding the following appts: Cardiac clearance/pre op appt on 02/19/12 at 8:50am w/ Tereso Newcomer.PA [phone] And to see [facility] on 02/28/12 at 11:30am for followup. She is aware of both appts. Jacklyn Shell

## 2012-02-17 ENCOUNTER — Telehealth: Payer: Self-pay | Admitting: Internal Medicine

## 2012-02-17 ENCOUNTER — Other Ambulatory Visit: Payer: Self-pay | Admitting: Internal Medicine

## 2012-02-17 NOTE — Telephone Encounter (Signed)
Call to see how he was doing, specifically to see if his pain is well-controlled.  Spoke with the wife, hydrocodone wasn't strong enough, currently taking OxyContin 10 mg 3 times a day prescribed are cardiovascular surgery. It is helping a lot with the pain, is getting slightly sleepy and occasionally confused but wife reports "at this point he needs that medicine". Recommend them to call me if severe confusion, will call me for refills.

## 2012-02-19 ENCOUNTER — Ambulatory Visit (INDEPENDENT_AMBULATORY_CARE_PROVIDER_SITE_OTHER): Payer: Medicare Other | Admitting: Physician Assistant

## 2012-02-19 ENCOUNTER — Encounter: Payer: Self-pay | Admitting: Physician Assistant

## 2012-02-19 VITALS — BP 146/96 | HR 76 | Ht 71.0 in

## 2012-02-19 DIAGNOSIS — I70229 Atherosclerosis of native arteries of extremities with rest pain, unspecified extremity: Secondary | ICD-10-CM

## 2012-02-19 DIAGNOSIS — I998 Other disorder of circulatory system: Secondary | ICD-10-CM

## 2012-02-19 DIAGNOSIS — I2510 Atherosclerotic heart disease of native coronary artery without angina pectoris: Secondary | ICD-10-CM

## 2012-02-19 DIAGNOSIS — E785 Hyperlipidemia, unspecified: Secondary | ICD-10-CM | POA: Diagnosis not present

## 2012-02-19 DIAGNOSIS — F17200 Nicotine dependence, unspecified, uncomplicated: Secondary | ICD-10-CM

## 2012-02-19 DIAGNOSIS — I2589 Other forms of chronic ischemic heart disease: Secondary | ICD-10-CM

## 2012-02-19 DIAGNOSIS — I999 Unspecified disorder of circulatory system: Secondary | ICD-10-CM | POA: Diagnosis not present

## 2012-02-19 DIAGNOSIS — Z01810 Encounter for preprocedural cardiovascular examination: Secondary | ICD-10-CM

## 2012-02-19 MED ORDER — METOPROLOL SUCCINATE ER 25 MG PO TB24: 25.0000 mg | ORAL_TABLET | Freq: Every day | ORAL | Status: DC

## 2012-02-19 MED ORDER — ASPIRIN EC 81 MG PO TBEC: 81.0000 mg | DELAYED_RELEASE_TABLET | Freq: Every day | ORAL | Status: DC

## 2012-02-19 NOTE — Progress Notes (Signed)
[address] [place], [place]  [address] Phone: [phone] Fax:  [phone]  Date:  02/19/2012   Name:  Jaime [name]   DOB:  [birth year]   MRN:  [id number]  PCP:  Willow Ora, MD  Primary Cardiologist:  Dr. Zackery Barefoot  Primary Electrophysiologist:  None    History of Present Illness: Jaime [name] is a 71 y.o. male who returns for surgical clearance.  He has a history of bilateral lower extremity chronic limb ischemia with bilateral SFA occlusive and tibial disease. He has been seen by Dr. Imogene Burn. He has a wound on his left foot. Options are lower extremity bypass versus left AKA.  The patient is reluctant to consider AKA.  He has a history of CAD, status post NSTEMI in 2007 tx with PCI to the CFX, ICM, systolic CHF, multiple sclerosis.   LHC 04/2006:  mLAD 50%, D2 80% at the origin, pCFX occluded, pRCA 50%, EF 40%.  PCI: Taxus DES to the pCFX.   Last echo 07/2006: Inferior and posterior severe HK, EF 45%, mild LVH, mild MR, mildly reduced RV systolic function, mild to moderately increased peak pulmonary artery systolic pressure.   Carotid Dopplers 08/2010:  Bilateral 0-39%  Last seen by Dr. Myrtis Ser 05/2010.  He is in a power wheelchair secondary to his multiple sclerosis. He does not ambulate. His left foot is constantly painful. He denies chest pain. He denies significant dyspnea at rest. His wife does note that he snores. She also has witnessed apneic episodes. He denies syncope. He denies orthopnea or PND. He denies significant pedal edema.   Past Medical History  Diagnosis Date  . Hyperlipidemia   . Cardiomyopathy, ischemic     EF 45% per ECHO 2008  . Pulmonary hypertension     moderate ECHO Jan 2008  . CHF (congestive heart failure)   . Systolic heart failure   . Multiple sclerosis DX 1993    NEUROLOGIST-  DR DOHMEIER -- LAST VISIT 11-20-2010  NOTE W/ CHART  . Lung nodule     resolved 11-2006 CT Chest  . Tobacco abuse   . Increased prostate  specific antigen (PSA) velocity   . Urinary retention     dx ~ 2-12, like from MS, now with a catheter, saw urology  . Ejection fraction      EF 45%, echo, 2008  . Cellulitis   . Pre-syncope     Near syncope, ER visit, October, 2011  . Carotid artery disease     Doppler, February, 2012, 0-39% bilateral,Mild smooth plaque  . Chronic indwelling foley catheter   . Scoliosis associated with other condition   . Hemiparesis   . Fatigue SEVERE  . Weakness   . Impotence   . MI, acute, non ST segment elevation 2007    S/P PCI WITH X1 STENT (TAXUS DRUG-ELUTING) LEFT CIRCUMFLEX  . Echocardiogram abnormal 08-17-2006    SEVERE HYPOKINESIS OF INFERIOR , POSTEROR WALLS OF BASE AND MID VENTRICLE AND MOD. PULMONARY HTN  . Diabetes mellitus     INSULIN-DEPENDANT  . CAD (coronary artery disease) CARDIOLOGIST- DR KATZ-- VISIT 06-05-2011 IN [facility]    non-STEMI, 2007.Marland Kitchenoccluded circumflex.. Taxus stent placed...residual 80% LAD...50% RCA  . Hypertension   . H/O pleural effusion 2008    POST THORACENTESIS  . History of colon polyps PRECANCEROUS  . Insomnia   . Ingrown right big toenail CHRONIC    PER PT VERY SORE    Current Outpatient Prescriptions  Medication Sig  Dispense Refill  . amantadine (SYMMETREL) 100 MG capsule Take 200 mg by mouth 3 (three) times daily. By urologist      . B-D ULTRAFINE III SHORT PEN 31G X 8 MM MISC       . diclofenac sodium (VOLTAREN) 1 % GEL Apply 1 application topically 4 (four) times daily as needed. For pain  3 Tube  11  . gabapentin (NEURONTIN) 300 MG capsule Take 1 capsule (300 mg total) by mouth 3 (three) times daily.  90 capsule  3  . insulin aspart protamine-insulin aspart (NOVOLOG 70/30) (70-30) 100 UNIT/ML injection Inject 10-30 Units into the skin 2 (two) times daily. 30 units at breakfast and 10 units at 10pm      . meloxicam (MOBIC) 15 MG tablet Take 1 tablet (15 mg total) by mouth daily.  30 tablet  1  . Oxycodone HCl 10 MG TABS 3 (three) times daily.         . simvastatin (ZOCOR) 40 MG tablet Take 1 tablet (40 mg total) by mouth at bedtime.  90 tablet  1    Allergies: Allergies  Allergen Reactions  . Bee Venom Anaphylaxis   History  Substance Use Topics  . Smoking status:  Current everyday smoker Current Everyday Smoker -- 50 years    Types: Cigarettes  . Smokeless tobacco: Never Used   Comment: 1 PPWEEK  . Alcohol Use: No     ROS:  Please see the history of present illness.     All other systems reviewed and negative.   PHYSICAL EXAM: VS:  BP 146/96  Pulse 76  Ht 5\' 11"  (1.803 m) Well nourished, well developed, in no acute distress HEENT: normal Neck: no JVD at 90 degrees  Cardiac:  normal S1, S2; RRR; no murmur Lungs:  Decreased breath sounds bilaterally, no wheezing, rhonchi or rales Abd: soft, non-tender  Ext: no edema Skin: warm and dry Neuro:  CNs 2-12 intact   EKG:  Sinus rhythm, heart rate 87, first degree AV block, PR 234 ms, LAD, LVH, no change from prior tracing      ASSESSMENT AND PLAN:  1. CAD He is not having any unstable symptoms. But, he is not ambulatory due to his MS. No ischemic assessment since his MI in 2007. Arrange Lexiscan Myoview. Start Toprol XL 25 mg QD and ASA 81 mg QD. Will follow up with an ECG at his Myoview to make sure he is tolerating beta blocker ok.  He had tolerated coreg in the past.  2. Ischemic cardiomyopathy Will follow up on his EF with his nuclear study. Volume overall stable. He will need close attention to his fluid status at the time of his surgery.  3. Peripheral arterial disease Pending LE bypass. Cardiac workup planned as noted above. If myoview low risk, he will be able to proceed with his surgery at acceptable risk.  Given his co-morbidities, he will likely be moderate risk at best for CV complications. I have added a beta blocker to help reduce this risk.  With his prior PCI, hopefully he can remain on ASA in the peri-op period.  If bleeding risk is too great,  this can be held and restarted afterward when safe. We will be in touch with his surgeon after his stress test is completed.  4. Tobacco Abuse We discussed the importance of quitting today.  5. Hyperlipidemia Continue zocor.  6. Multiple Sclerosis  Managed by neurology.  7. Snoring  We will need to consider sleep testing  at a later date.  Luna Glasgow, PA-C  9:26 AM 02/19/2012

## 2012-02-19 NOTE — Addendum Note (Signed)
Addended by: Tarri Fuller on: 02/19/2012 10:16 AM   Modules accepted: Orders

## 2012-02-19 NOTE — Addendum Note (Signed)
Addended by: Tarri Fuller on: 02/19/2012 10:19 AM   Modules accepted: Orders

## 2012-02-19 NOTE — Patient Instructions (Addendum)
START TOPROL XL 25 MG DAILY  START ASPIRN 81 MG DAILY  Your physician has requested that you have a lexiscan myoview DX V72.81, 414.01rmation please visit [email or web address] Please follow instruction sheet, as given.  Your physician wants you to follow-up in: 4 MONTHS WITH DR. KATZ. You will receive a letter in the mail two months in advance. If you don't receive a letter, please call our office to schedule the follow-up appointment.

## 2012-02-27 ENCOUNTER — Ambulatory Visit (HOSPITAL_COMMUNITY)
Admission: RE | Admit: 2012-02-27 | Discharge: 2012-02-27 | Disposition: A | Discharge status: Home / Self Care | Payer: Medicare Other | Source: Ambulatory Visit | Attending: Physician Assistant | Admitting: Physician Assistant

## 2012-02-27 ENCOUNTER — Encounter: Payer: Self-pay | Admitting: Vascular Surgery

## 2012-02-27 ENCOUNTER — Encounter (HOSPITAL_COMMUNITY)
Admission: RE | Admit: 2012-02-27 | Discharge: 2012-02-27 | Disposition: A | Discharge status: Home / Self Care | Payer: Medicare Other | Source: Ambulatory Visit | Attending: Physician Assistant | Admitting: Physician Assistant

## 2012-02-27 DIAGNOSIS — I739 Peripheral vascular disease, unspecified: Secondary | ICD-10-CM | POA: Diagnosis not present

## 2012-02-27 DIAGNOSIS — G35 Multiple sclerosis: Secondary | ICD-10-CM | POA: Diagnosis not present

## 2012-02-27 DIAGNOSIS — I2510 Atherosclerotic heart disease of native coronary artery without angina pectoris: Secondary | ICD-10-CM

## 2012-02-27 DIAGNOSIS — Z01810 Encounter for preprocedural cardiovascular examination: Secondary | ICD-10-CM | POA: Insufficient documentation

## 2012-02-27 MED ORDER — REGADENOSON 0.4 MG/5ML IV SOLN
0.4000 mg | Freq: Once | INTRAVENOUS | Status: AC
  Administered 2012-02-27: 0.4 mg via INTRAVENOUS

## 2012-02-27 MED ORDER — TECHNETIUM TC 99M TETROFOSMIN IV KIT
30.0000 | PACK | Freq: Once | INTRAVENOUS | Status: AC | PRN
  Administered 2012-02-27: 30 via INTRAVENOUS

## 2012-02-27 MED ORDER — REGADENOSON 0.4 MG/5ML IV SOLN
INTRAVENOUS | Status: AC
  Administered 2012-02-27: 0.4 mg via INTRAVENOUS
  Filled 2012-02-27: qty 5

## 2012-02-27 MED ORDER — TECHNETIUM TC 99M TETROFOSMIN IV KIT
10.0000 | PACK | Freq: Once | INTRAVENOUS | Status: AC | PRN
  Administered 2012-02-27: 10 via INTRAVENOUS

## 2012-02-28 ENCOUNTER — Ambulatory Visit: Payer: Medicare Other | Admitting: Vascular Surgery

## 2012-02-28 ENCOUNTER — Telehealth: Payer: Self-pay | Admitting: *Deleted

## 2012-02-28 DIAGNOSIS — I509 Heart failure, unspecified: Secondary | ICD-10-CM

## 2012-02-28 NOTE — Telephone Encounter (Signed)
Message copied by Tarri Fuller on Fri Feb 28, 2012 10:17 AM ------      Message from: [place], [place] T      Created: Thu Feb 27, 2012  5:28 PM       No ischemia.      Scar c/w prior studies.      BUT - EF is worse.      Schedule echo to reassess EF.      Tereso Newcomer, PA-C  5:27 PM 02/27/2012

## 2012-02-28 NOTE — Telephone Encounter (Signed)
s/w both pt and is wife about stress test results and the need to have echo to reasses EF, echo scheduled for 8/13 @ 7:30, both pt and wife gave me verbal understanding to results and echo appt/time.

## 2012-03-02 ENCOUNTER — Telehealth: Payer: Self-pay | Admitting: *Deleted

## 2012-03-02 DIAGNOSIS — I70269 Atherosclerosis of native arteries of extremities with gangrene, unspecified extremity: Secondary | ICD-10-CM

## 2012-03-02 DIAGNOSIS — I7092 Chronic total occlusion of artery of the extremities: Secondary | ICD-10-CM

## 2012-03-02 MED ORDER — HYDROCODONE-ACETAMINOPHEN 5-500 MG PO TABS: 1.0000 | ORAL_TABLET | Freq: Four times a day (QID) | ORAL | Status: DC | PRN

## 2012-03-02 NOTE — Telephone Encounter (Signed)
Patient's wife called to report Mr. Jaime [name] is out of pain meds; has echocardiogram scheduled for tomorrow and has appt w/ [facility] on Friday. I consulted w/ Okey Regal and I called in #20 Vicodin 5/500 1 po q 6hrs prn to [facility] per protocol order. Mrs. Hingle was told that if this didn't help, she should take him directly to the ED for evaluation. She voiced understanding and agreed with plan. Dr. Nicky Pugh appt is Friday.

## 2012-03-03 ENCOUNTER — Ambulatory Visit (HOSPITAL_COMMUNITY): Payer: Medicare Other | Attending: Cardiovascular Disease | Admitting: Radiology

## 2012-03-03 ENCOUNTER — Other Ambulatory Visit: Payer: Self-pay

## 2012-03-03 DIAGNOSIS — I2510 Atherosclerotic heart disease of native coronary artery without angina pectoris: Secondary | ICD-10-CM | POA: Insufficient documentation

## 2012-03-03 DIAGNOSIS — I10 Essential (primary) hypertension: Secondary | ICD-10-CM | POA: Insufficient documentation

## 2012-03-03 DIAGNOSIS — E119 Type 2 diabetes mellitus without complications: Secondary | ICD-10-CM | POA: Diagnosis not present

## 2012-03-03 DIAGNOSIS — I059 Rheumatic mitral valve disease, unspecified: Secondary | ICD-10-CM | POA: Diagnosis not present

## 2012-03-03 DIAGNOSIS — F17200 Nicotine dependence, unspecified, uncomplicated: Secondary | ICD-10-CM | POA: Diagnosis not present

## 2012-03-03 DIAGNOSIS — E785 Hyperlipidemia, unspecified: Secondary | ICD-10-CM | POA: Diagnosis not present

## 2012-03-03 DIAGNOSIS — I509 Heart failure, unspecified: Secondary | ICD-10-CM

## 2012-03-03 NOTE — Progress Notes (Signed)
Echocardiogram performed.  

## 2012-03-05 ENCOUNTER — Encounter: Payer: Self-pay | Admitting: Vascular Surgery

## 2012-03-06 ENCOUNTER — Other Ambulatory Visit: Payer: Self-pay | Admitting: *Deleted

## 2012-03-06 ENCOUNTER — Telehealth: Payer: Self-pay | Admitting: *Deleted

## 2012-03-06 ENCOUNTER — Ambulatory Visit (INDEPENDENT_AMBULATORY_CARE_PROVIDER_SITE_OTHER): Payer: Medicare Other | Admitting: Vascular Surgery

## 2012-03-06 ENCOUNTER — Encounter: Payer: Self-pay | Admitting: *Deleted

## 2012-03-06 ENCOUNTER — Encounter: Payer: Self-pay | Admitting: Vascular Surgery

## 2012-03-06 VITALS — BP 118/81 | HR 74 | Resp 20 | Ht 71.0 in | Wt 144.0 lb

## 2012-03-06 DIAGNOSIS — I739 Peripheral vascular disease, unspecified: Secondary | ICD-10-CM | POA: Diagnosis not present

## 2012-03-06 DIAGNOSIS — I70209 Unspecified atherosclerosis of native arteries of extremities, unspecified extremity: Secondary | ICD-10-CM | POA: Insufficient documentation

## 2012-03-06 DIAGNOSIS — L98499 Non-pressure chronic ulcer of skin of other sites with unspecified severity: Secondary | ICD-10-CM

## 2012-03-06 NOTE — Telephone Encounter (Signed)
Message copied by Tarri Fuller on Fri Mar 06, 2012 12:39 PM ------      Message from: [place], [place] T      Created: Wed Mar 04, 2012 11:15 AM       EF is much lower than previous.      I reviewed his stress test findings and echo findings and hx with Dr. Zackery Barefoot.      He can proceed with his vascular surgery.  He is a moderate risk of cardiovascular complications, but we do not recommend any further cardiac workup at this time. Close attention is needed during surgery to avoid volume overload (by surgical team).      He will need to have his medications adjusted for his low EF.  But, we will pursue this after his surgery.      Continue Toprol that was started at his office visit with me.      Please arrange office visit in the next 1-2 mos with Dr. Myrtis Ser or me so we can adjust his medications.      I can speak with him when you get him on the phone.      I will cc copy of stress test and echo to Dr. Imogene Burn.      Tereso Newcomer, PA-C  11:13 AM 03/04/2012

## 2012-03-06 NOTE — Progress Notes (Signed)
[facility] [place] [facility]  Established Critical Limb Ischemia Patient  History of Present Illness  Jaime [name] is a 71 y.o. (02/18/1941) male who presents with chief complaint: left leg rest pain.  The patient has rest pain and right foot ischemic changes have worsened.  Patient denies any drainage or fever or chills.  The patient's treatment regimen currently included: maximal medical management and narcotics.  The patient has been undergoing preoperative cardiac work-up and preoperative optimization.  Per Dr. Katz, patient is at least at moderate risk for cardiac complications..  Past Medical History  Diagnosis Date  . Hyperlipidemia   . Cardiomyopathy, ischemic     EF 45% per ECHO 2008  . Pulmonary hypertension     moderate ECHO Jan 2008  . CHF (congestive heart failure)   . Systolic heart failure   . Multiple sclerosis DX 1993    NEUROLOGIST-  DR DOHMEIER -- LAST VISIT 11-20-2010  NOTE W/ CHART  . Lung nodule     resolved 11-2006 CT Chest  . Tobacco abuse   . Increased prostate specific antigen (PSA) velocity   . Urinary retention     dx ~ 2-12, like from MS, now with a catheter, saw urology  . Ejection fraction      EF 45%, echo, 2008  . Cellulitis   . Pre-syncope     Near syncope, ER visit, October, 2011  . Carotid artery disease     Doppler, February, 2012, 0-39% bilateral,Mild smooth plaque  . Chronic indwelling foley catheter   . Scoliosis associated with other condition   . Hemiparesis   . Fatigue SEVERE  . Weakness   . Impotence   . MI, acute, non ST segment elevation 2007    S/P PCI WITH X1 STENT (TAXUS DRUG-ELUTING) LEFT CIRCUMFLEX  . Echocardiogram abnormal 08-17-2006    SEVERE HYPOKINESIS OF INFERIOR , POSTEROR WALLS OF BASE AND MID VENTRICLE AND MOD. PULMONARY HTN  . Diabetes mellitus     INSULIN-DEPENDANT  . CAD (coronary artery disease) CARDIOLOGIST- DR KATZ-- VISIT 06-05-2011 IN [facility]    non-STEMI, 2007..occluded circumflex.. Taxus  stent placed...residual 80% LAD...50% RCA  . Hypertension   . H/O pleural effusion 2008    POST THORACENTESIS  . History of colon polyps PRECANCEROUS  . Insomnia   . Ingrown right big toenail CHRONIC    PER PT VERY SORE    Past Surgical History  Procedure Date  . Hernia repair 1990    (R)  . Coronary angioplasty with stent placement 05-01-2006    OCCLUDED CIRCUMFLEX -- TAXUS STENT PLACMENT  AND RESIDUAL 80% LAD,  50% RCA  . Thoracentesis 2008    PLEURAL EFFUSION  . Cystoscopy 07/30/2011    Procedure: CYSTOSCOPY;  Surgeon: Marc-Henry Nesi, MD;  Location: [facility];  Service: Urology;  Laterality: N/A;  . Transurethral resection of prostate 07/30/2011    Procedure: TRANSURETHRAL RESECTION OF THE PROSTATE (TURP);  Surgeon: Marc-Henry Nesi, MD;  Location: [facility];  Service: Urology;  Laterality: N/A;    History   Social History  . Marital Status: Married    Spouse Name: N/A    Number of Children: 2  . Years of Education: N/A   Occupational History  . disable    Social History Main Topics  . Smoking status: Current Everyday Smoker -- 50 years    Types: Cigarettes  . Smokeless tobacco: Never Used   Comment: 1 PPWEEK  . Alcohol Use: No  .   Drug Use: No  . Sexually Active: Not on file   Other Topics Concern  . Not on file   Social History Narrative   Lives w/ wife    Family History  Problem Relation Age of Onset  . Hypertension    . Heart attack Mother 83  . Heart disease Mother   . Stroke Mother   . Hyperlipidemia Mother   . Hypertension Mother   . Colon cancer Neg Hx   . Prostate cancer Neg Hx   . COPD Father   . Peripheral vascular disease Father   . Diabetes Brother   . Heart disease Brother   . Hypertension Brother   . Heart attack Brother   . Diabetes Daughter     Current Outpatient Prescriptions on File Prior to Visit  Medication Sig Dispense Refill  . amantadine (SYMMETREL) 100 MG capsule Take 200 mg by mouth 3  (three) times daily. By urologist      . aspirin EC 81 MG tablet Take 1 tablet (81 mg total) by mouth daily.      . B-D ULTRAFINE III SHORT PEN 31G X 8 MM MISC       . diclofenac sodium (VOLTAREN) 1 % GEL Apply 1 application topically 4 (four) times daily as needed. For pain  3 Tube  11  . gabapentin (NEURONTIN) 300 MG capsule Take 1 capsule (300 mg total) by mouth 3 (three) times daily.  90 capsule  3  . HYDROcodone-acetaminophen (VICODIN) 5-500 MG per tablet Take 1 tablet by mouth every 6 (six) hours as needed for pain.  20 tablet  0  . insulin aspart protamine-insulin aspart (NOVOLOG 70/30) (70-30) 100 UNIT/ML injection Inject 10-30 Units into the skin 2 (two) times daily. 30 units at breakfast and 10 units at 10pm      . meloxicam (MOBIC) 15 MG tablet Take 1 tablet (15 mg total) by mouth daily.  30 tablet  1  . metoprolol succinate (TOPROL XL) 25 MG 24 hr tablet Take 1 tablet (25 mg total) by mouth daily.  30 tablet  11  . Oxycodone HCl 10 MG TABS 3 (three) times daily.       . simvastatin (ZOCOR) 40 MG tablet Take 1 tablet (40 mg total) by mouth at bedtime.  90 tablet  1    Allergies  Allergen Reactions  . Bee Venom Anaphylaxis    Review of Systems (Positive items checked otherwise negative)  General: [ ] Weight loss, [ ] Weight gain, [ ]  Loss of appetite, [ ] Fever  Neurologic: [ ] Dizziness, [ ] Blackouts, [ ] Headaches, [x] Multiple sclerosis with resultant weakness  Ear/Nose/Throat: [ ] Change in eyesight, [ ] Change in hearing, [ ] Nose bleeds, [ ] Sore throat  Vascular: [x] Pain in legs with walking, [x] Pain in feet while lying flat, [ ] Non-healing ulcer, Stroke, [ ] "Mini stroke", [ ] Slurred speech, [ ] Temporary blindness, [ ] Blood clot in vein, [ ] Phlebitis  Pulmonary: [ ] Home oxygen, [ ] Productive cough, [ ] Bronchitis, [ ] Coughing up blood, [ ] Asthma, [ ] Wheezing  Musculoskeletal: [ ] Arthritis, [ ] Joint pain, [ ] Muscle pain  Cardiac: [ ] Chest pain, [ ]  Chest tightness/pressure, [ ] Shortness of breath when lying flat, [ ] Shortness of breath with exertion, [ ] Palpitations, [ ] Heart murmur, [ ] Arrythmia, [ ] Atrial fibrillation  Hematologic: [ ] Bleeding problems, [ ]   Clotting disorder, [ ] Anemia  Psychiatric:  [ ] Depression, [ ] Anxiety, [ ] Attention deficit disorder  Gastrointestinal:  [ ] Black stool,[ ]  Blood in stool, [ ] Peptic ulcer disease, [ ] Reflux, [ ] Hiatal hernia, [ ] Trouble swallowing, [ ] Diarrhea, [ ] Constipation  Urinary:  [ ] Kidney disease, [ ] Burning with urination, [ ] Frequent urination, [ ] Difficulty urinating  Skin: [ ] Ulcers, [x] L foot gangrene  Physical Examination  Filed Vitals:   03/06/12 1012  BP: 118/81  Pulse: 74  Resp: 20  Height: 5' 11" (1.803 m)  Weight: 144 lb (65.318 kg)   Body mass index is 20.08 kg/(m^2).   General: A&O x 3, WDWN, wheelchair bound  Eyes: PERRLA, EOMI  Pulmonary: Sym exp, good air movt, CTAB, no rales, rhonchi, & wheezing  Cardiac: RRR, Nl S1, S2, no Murmurs, rubs or gallops  Vascular: Vessel Right Left  Radial Palpable Palpable  Ulnar Palpable Palpable  Brachial Palpable Palpable  Carotid Palpable, without bruit Palpable, without bruit  Aorta Not palpable N/A  Femoral Palpable Palpable  Popliteal Not palpable Not palpable  PT not Palpable not Palpable  DP not Palpable not Palpable   Gastrointestinal: soft, NTND, -G/R, - HSM, - masses, - CVAT B  Musculoskeletal: RUE 3-4/5, LUE 4/5, RLE 2-3/5, LLE 3-4/5 , Extremities without ischemic changes , Both feet appear viable, Both leg cool from distal thigh down , L 1st-3rd toe gangrene, without frank drainage today  Neurologic: CN 2-12 intact , Pain and light touch intact in extremities , Motor exam as listed above  Medical Decision Making  Jaime [name] is a 71 y.o. 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   Based on the patient's vascular studies and examination, I have  offered the patient: bypass with postop likely TMA vs L AKA.  He refuses to proceed with L AKA, so I have scheduled him for : L CFA to TPT bypass likely with Propaten +/- vein cuff, likely BK pop endarterectomy with patch angioplasty on 21 AUG 13. The risk, benefits, and alternative for bypass operations were discussed with the patient.  The patient is aware the risks include but are not limited to: bleeding, infection, myocardial infarction, stroke, limb loss, nerve damage, need for additional procedures in the future, wound complications, and inability to complete the bypass.   I explicitly discussed with the patient and family risk of death due to myocardial infarction and prolonged ventilator time due to his MS. The patient is aware of these risks and agreed to proceed.  I discussed in depth with the patient the nature of atherosclerosis, and emphasized the importance of maximal medical management including strict control of blood pressure, blood glucose, and lipid levels, antiplatelet agents, obtaining regular exercise, and cessation of smoking.    The patient is aware that without maximal medical management the underlying atherosclerotic disease process will progress, limiting the benefit of any interventions.  Thank you for allowing us to participate in this patient's care.  Advait Buice, MD [facility] Office: [phone] Pager: [phone]  03/06/2012, 4:04 PM    

## 2012-03-06 NOTE — Telephone Encounter (Signed)
pt wife notified of echo results and risks for surgery though pt cleared for surgery, 04/20/12 w/JK 3 pm, wife states they saw surgeon today and he also explained the risk of the surgery, wife gave me verbal understanding

## 2012-03-09 ENCOUNTER — Encounter: Payer: Self-pay | Admitting: Physical Medicine & Rehabilitation

## 2012-03-09 ENCOUNTER — Ambulatory Visit (HOSPITAL_BASED_OUTPATIENT_CLINIC_OR_DEPARTMENT_OTHER): Payer: Medicare Other | Admitting: Physical Medicine & Rehabilitation

## 2012-03-09 ENCOUNTER — Encounter: Payer: Medicare Other | Attending: Physical Medicine & Rehabilitation

## 2012-03-09 VITALS — BP 169/97 | HR 58 | Resp 16 | Ht 71.0 in | Wt 149.0 lb

## 2012-03-09 DIAGNOSIS — G8220 Paraplegia, unspecified: Secondary | ICD-10-CM | POA: Insufficient documentation

## 2012-03-09 DIAGNOSIS — M79609 Pain in unspecified limb: Secondary | ICD-10-CM | POA: Diagnosis not present

## 2012-03-09 DIAGNOSIS — M25579 Pain in unspecified ankle and joints of unspecified foot: Secondary | ICD-10-CM | POA: Insufficient documentation

## 2012-03-09 DIAGNOSIS — G35 Multiple sclerosis: Secondary | ICD-10-CM | POA: Insufficient documentation

## 2012-03-09 DIAGNOSIS — G8929 Other chronic pain: Secondary | ICD-10-CM

## 2012-03-09 DIAGNOSIS — M21549 Acquired clubfoot, unspecified foot: Secondary | ICD-10-CM | POA: Insufficient documentation

## 2012-03-09 MED ORDER — DICLOFENAC SODIUM 1 % TD GEL: 1.0000 "application " | Freq: Four times a day (QID) | TRANSDERMAL | Status: DC | PRN

## 2012-03-09 NOTE — Addendum Note (Signed)
Addended by: Judd Gaudier on: 03/09/2012 12:42 PM   Modules accepted: Orders

## 2012-03-09 NOTE — Patient Instructions (Signed)
If you've undergo amputation during her surgery he may require inpatient rehabilitation. You can consult me by in order and Dr. Imogene Burn. In the meantime try to minimize her usage of the oxycodone to make your postoperative pain better managed. I'll see back in 2 months. I [facility] another prescription [facility]

## 2012-03-09 NOTE — Progress Notes (Signed)
Subjective:    Patient ID: Jaime [name], male    DOB: 1941-01-02, 71 y.o.   MRN: [id number] Has a history of multiple sclerosis with paraplegia. He is in a power chair. He is seen by vascular surgery for peripheral vascular disease. He has had good relief of toe pain with diclofenac gel and is running out of this. He is not using it more than 2 or 3 times per day.  HPI Scheduled for left lower extremity revascularization procedure. He may require amputation. He is planning to go to [facility] on 03/11/2012. Dr. Imogene Burn will be the surgeon No change in the multiple sclerosis. Pain Inventory Average Pain 10 Pain Right Now 0 My pain is sharp and aching  In the last 24 hours, has pain interfered with the following? General activity 10 Relation with others 9 Enjoyment of life 10 What TIME of day is your pain at its worst? All Day Sleep (in general) Poor  Pain is worse with: unsure Pain improves with: medication Relief from Meds: 4  Mobility ability to climb steps?  no do you drive?  no use a wheelchair  Function disabled: date disabled 41 I need assistance with the following:  dressing, bathing, toileting, meal prep, household duties and shopping  Neuro/Psych bladder control problems bowel control problems weakness numbness  Prior Studies Any changes since last visit?  no  Physicians involved in your care Any changes since last visit?  no   Family History  Problem Relation Age of Onset  . Hypertension    . Heart attack Mother 90  . Heart disease Mother   . Stroke Mother   . Hyperlipidemia Mother   . Hypertension Mother   . Colon cancer Neg Hx   . Prostate cancer Neg Hx   . COPD Father   . Peripheral vascular disease Father   . Diabetes Brother   . Heart disease Brother   . Hypertension Brother   . Heart attack Brother   . Diabetes Daughter    History   Social History  . Marital Status: Married    Spouse Name: N/A    Number of Children: 2  .  Years of Education: N/A   Occupational History  . disable    Social History Main Topics  . Smoking status: Former Smoker -- 50 years    Types: Cigarettes  . Smokeless tobacco: Never Used   Comment: 1 PPWEEK  . Alcohol Use: No  . Drug Use: No  . Sexually Active: None   Other Topics Concern  . None   Social History Narrative   Lives w/ wife   Past Surgical History  Procedure Date  . Hernia repair 1990    (R)  . Coronary angioplasty with stent placement 05-01-2006    OCCLUDED CIRCUMFLEX -- TAXUS STENT PLACMENT  AND RESIDUAL 80% LAD,  50% RCA  . Thoracentesis 2008    PLEURAL EFFUSION  . Cystoscopy 07/30/2011    Procedure: CYSTOSCOPY;  Surgeon: Lindaann Slough, MD;  [facility];  Service: Urology;  Laterality: N/A;  . Transurethral resection of prostate 07/30/2011    Procedure: TRANSURETHRAL RESECTION OF THE PROSTATE (TURP);  Surgeon: Lindaann Slough, MD;  Location: [facility];  Service: Urology;  Laterality: N/A;   Past Medical History  Diagnosis Date  . Hyperlipidemia   . Cardiomyopathy, ischemic     EF 45% per ECHO 2008  . Pulmonary hypertension     moderate ECHO Jan 2008  . CHF (  congestive heart failure)   . Systolic heart failure   . Multiple sclerosis DX 1993    NEUROLOGIST-  DR DOHMEIER -- LAST VISIT 11-20-2010  NOTE W/ CHART  . Lung nodule     resolved 11-2006 CT Chest  . Tobacco abuse   . Increased prostate specific antigen (PSA) velocity   . Urinary retention     dx ~ 2-12, like from MS, now with a catheter, saw urology  . Ejection fraction      EF 45%, echo, 2008  . Cellulitis   . Pre-syncope     Near syncope, ER visit, October, 2011  . Carotid artery disease     Doppler, February, 2012, 0-39% bilateral,Mild smooth plaque  . Chronic indwelling foley catheter   . Scoliosis associated with other condition   . Hemiparesis   . Fatigue SEVERE  . Weakness   . Impotence   . MI, acute, non ST segment elevation 2007     S/P PCI WITH X1 STENT (TAXUS DRUG-ELUTING) LEFT CIRCUMFLEX  . Echocardiogram abnormal 08-17-2006    SEVERE HYPOKINESIS OF INFERIOR , POSTEROR WALLS OF BASE AND MID VENTRICLE AND MOD. PULMONARY HTN  . Diabetes mellitus     INSULIN-DEPENDANT  . CAD (coronary artery disease) CARDIOLOGIST- DR KATZ-- VISIT 06-05-2011 IN [facility]    non-STEMI, 2007.Marland Kitchenoccluded circumflex.. Taxus stent placed...residual 80% LAD...50% RCA  . Hypertension   . H/O pleural effusion 2008    POST THORACENTESIS  . History of colon polyps PRECANCEROUS  . Insomnia   . Ingrown right big toenail CHRONIC    PER PT VERY SORE   BP 169/97  Pulse 58  Resp 16  Ht 5\' 11"  (1.803 m)  Wt 149 lb (67.586 kg)  BMI 20.78 kg/m2  SpO2 98%     Review of Systems  Constitutional: Positive for unexpected weight change.  HENT: Negative.   Respiratory: Negative.   Cardiovascular: Negative.   Gastrointestinal: Positive for diarrhea.  Genitourinary: Positive for urgency.  Musculoskeletal: Negative.   Skin: Positive for rash (L foot).  Neurological: Positive for weakness and numbness.  Hematological: Negative.   Psychiatric/Behavioral: Negative.        Objective:   Physical Exam  Constitutional: He is oriented to person, place, and time. He appears well-developed and well-nourished.  Musculoskeletal:       Left foot: He exhibits decreased capillary refill and deformity.       Gangrene in the left third toe primarily. Cool skin temperature distal foot  Neurological: He is alert and oriented to person, place, and time. He exhibits abnormal muscle tone. Coordination abnormal.       Ataxia all 4 extremities worse in the lower than the upper Motor strength is 5/5 in bilateral upper extremities 3/5 bilateral knee extensors 2/5 in hip flexors 2/5 and ankle dorsiflexor plantar flexors  Psychiatric: He has a normal mood and affect.          Assessment & Plan:  1. Left lower extremity pain multifactorial he does have musculoskeletal  pain in the foot and ankle as well as vascular ischemic pain in the left toes and distal foot. He is scheduled for a revascularization procedure at [facility] in 2 days.he may require amputation. If so he may require inpatient rehabilitation. In terms of his pain management I have recommended for him to minimize his oxycodone preoperatively as this will help him with his postoperative pain management In terms of his Voltaren gel have written refill on that. I'll see him back  in 2 months.

## 2012-03-10 ENCOUNTER — Encounter (HOSPITAL_COMMUNITY): Payer: Self-pay | Admitting: Pharmacy Technician

## 2012-03-10 ENCOUNTER — Ambulatory Visit (HOSPITAL_COMMUNITY)
Admission: RE | Admit: 2012-03-10 | Discharge: 2012-03-10 | Disposition: A | Discharge status: Home / Self Care | Payer: Medicare Other | Source: Ambulatory Visit | Attending: Vascular Surgery | Admitting: Vascular Surgery

## 2012-03-10 ENCOUNTER — Encounter (HOSPITAL_COMMUNITY): Payer: Self-pay

## 2012-03-10 ENCOUNTER — Encounter (HOSPITAL_COMMUNITY)
Admission: RE | Admit: 2012-03-10 | Discharge: 2012-03-10 | Disposition: A | Discharge status: Home / Self Care | Payer: Medicare Other | Source: Ambulatory Visit | Attending: Vascular Surgery | Admitting: Vascular Surgery

## 2012-03-10 DIAGNOSIS — Z01812 Encounter for preprocedural laboratory examination: Secondary | ICD-10-CM | POA: Insufficient documentation

## 2012-03-10 DIAGNOSIS — Z01818 Encounter for other preprocedural examination: Secondary | ICD-10-CM | POA: Insufficient documentation

## 2012-03-10 DIAGNOSIS — Z01811 Encounter for preprocedural respiratory examination: Secondary | ICD-10-CM | POA: Diagnosis not present

## 2012-03-10 DIAGNOSIS — I10 Essential (primary) hypertension: Secondary | ICD-10-CM | POA: Diagnosis not present

## 2012-03-10 DIAGNOSIS — M4120 Other idiopathic scoliosis, site unspecified: Secondary | ICD-10-CM | POA: Insufficient documentation

## 2012-03-10 HISTORY — DX: Non-pressure chronic ulcer of skin of other sites with unspecified severity: L98.499

## 2012-03-10 HISTORY — DX: Urinary tract infection, site not specified: N39.0

## 2012-03-10 HISTORY — DX: Unspecified atherosclerosis of native arteries of extremities, unspecified extremity: I70.209

## 2012-03-10 LAB — COMPREHENSIVE METABOLIC PANEL
ALT: 21 U/L (ref 0–53)
AST: 16 U/L (ref 0–37)
Alkaline Phosphatase: 96 U/L (ref 39–117)
CO2: 27 mEq/L (ref 19–32)
GFR calc Af Amer: >90 mL/min (ref >90)
GFR calc non Af Amer: 90 mL/min — ABNORMAL LOW (ref >90)
Glucose, Bld: 141 mg/dL — ABNORMAL HIGH (ref 70–99)
Potassium: 4 mEq/L (ref 3.5–5.1)
Sodium: 135 mEq/L (ref 135–145)
Total Protein: 7.4 g/dL (ref 6.0–8.3)

## 2012-03-10 LAB — CBC
Hemoglobin: 13.3 g/dL (ref 13.0–17.0)
RBC: 5 MIL/uL (ref 4.22–5.81)

## 2012-03-10 LAB — APTT: aPTT: 37 seconds (ref 24–37)

## 2012-03-10 LAB — SURGICAL PCR SCREEN: MRSA, PCR: NEGATIVE

## 2012-03-10 LAB — PROTIME-INR: INR: 1.04 (ref 0.00–1.49)

## 2012-03-10 LAB — ABO/RH: ABO/RH(D): A POS

## 2012-03-10 MED ORDER — SODIUM CHLORIDE 0.9 % IV SOLN
INTRAVENOUS | Status: DC
  Administered 2012-03-11 (×3): via INTRAVENOUS

## 2012-03-10 MED ORDER — DEXTROSE 5 % IV SOLN
1.5000 g | INTRAVENOUS | Status: AC
  Administered 2012-03-11: 1.5 g via INTRAVENOUS
  Filled 2012-03-10: qty 1.5

## 2012-03-10 NOTE — Pre-Procedure Instructions (Signed)
20 ACESON LABELL  03/10/2012   Your procedure is scheduled on:  Wednesday March 11, 2012  Report to [facility] Stay [facility] at 6:30 AM.  Call this number if you have problems the morning of surgery: [phone]   Remember:   Do not eat food or drink:After Midnight.      Take these medicines the morning of surgery with A SIP OF WATER: anantadine, gabapentin,vicodin or oxycodone, metoprolol   Do not wear jewelry, make-up or nail polish.  Do not wear lotions, powders, or perfumes. You may wear deodorant.  Do not shave 48 hours prior to surgery. Men may shave face and neck.  Do not bring valuables to the hospital.  Contacts, dentures or bridgework may not be worn into surgery.  Leave suitcase in the car. After surgery it may be brought to your room.  For patients admitted to the hospital, checkout time is 11:00 AM the day of discharge.   Patients discharged the day of surgery will not be allowed to drive home.  Name and phone number of your driver: Zyad Boomer [phone]  Special Instructions: CHG Shower Use Special Wash: 1/2 bottle night before surgery and 1/2 bottle morning of surgery.   Please read over the following fact sheets that you were given: Pain Booklet, Coughing and Deep Breathing, Blood Transfusion Information, MRSA Information and Surgical Site Infection Prevention

## 2012-03-11 ENCOUNTER — Encounter (HOSPITAL_COMMUNITY): Payer: Self-pay | Admitting: Critical Care Medicine

## 2012-03-11 ENCOUNTER — Encounter (HOSPITAL_COMMUNITY)
Admission: RE | Disposition: A | Discharge status: Rehabilitation Facility | Payer: Self-pay | Source: Ambulatory Visit | Attending: Vascular Surgery

## 2012-03-11 ENCOUNTER — Inpatient Hospital Stay (HOSPITAL_COMMUNITY): Payer: Medicare Other | Admitting: Critical Care Medicine

## 2012-03-11 ENCOUNTER — Ambulatory Visit (HOSPITAL_COMMUNITY): Payer: Medicare Other | Admitting: Anesthesiology

## 2012-03-11 ENCOUNTER — Encounter (HOSPITAL_COMMUNITY): Payer: Self-pay | Admitting: Anesthesiology

## 2012-03-11 ENCOUNTER — Inpatient Hospital Stay (HOSPITAL_COMMUNITY)
Admission: RE | Admit: 2012-03-11 | Discharge: 2012-03-19 | DRG: 240 | Disposition: A | Discharge status: Rehabilitation Facility | Payer: Medicare Other | Source: Ambulatory Visit | Attending: Vascular Surgery | Admitting: Vascular Surgery

## 2012-03-11 DIAGNOSIS — Z7982 Long term (current) use of aspirin: Secondary | ICD-10-CM | POA: Diagnosis not present

## 2012-03-11 DIAGNOSIS — T82898A Other specified complication of vascular prosthetic devices, implants and grafts, initial encounter: Secondary | ICD-10-CM | POA: Diagnosis not present

## 2012-03-11 DIAGNOSIS — S78119A Complete traumatic amputation at level between unspecified hip and knee, initial encounter: Secondary | ICD-10-CM | POA: Diagnosis not present

## 2012-03-11 DIAGNOSIS — I2589 Other forms of chronic ischemic heart disease: Secondary | ICD-10-CM | POA: Diagnosis present

## 2012-03-11 DIAGNOSIS — Z5189 Encounter for other specified aftercare: Secondary | ICD-10-CM | POA: Diagnosis not present

## 2012-03-11 DIAGNOSIS — Z794 Long term (current) use of insulin: Secondary | ICD-10-CM | POA: Diagnosis not present

## 2012-03-11 DIAGNOSIS — I5022 Chronic systolic (congestive) heart failure: Secondary | ICD-10-CM | POA: Diagnosis not present

## 2012-03-11 DIAGNOSIS — Z79899 Other long term (current) drug therapy: Secondary | ICD-10-CM

## 2012-03-11 DIAGNOSIS — I743 Embolism and thrombosis of arteries of the lower extremities: Secondary | ICD-10-CM | POA: Diagnosis not present

## 2012-03-11 DIAGNOSIS — F17200 Nicotine dependence, unspecified, uncomplicated: Secondary | ICD-10-CM | POA: Diagnosis present

## 2012-03-11 DIAGNOSIS — I2510 Atherosclerotic heart disease of native coronary artery without angina pectoris: Secondary | ICD-10-CM | POA: Diagnosis present

## 2012-03-11 DIAGNOSIS — Z01812 Encounter for preprocedural laboratory examination: Secondary | ICD-10-CM

## 2012-03-11 DIAGNOSIS — G35 Multiple sclerosis: Secondary | ICD-10-CM | POA: Diagnosis not present

## 2012-03-11 DIAGNOSIS — I70219 Atherosclerosis of native arteries of extremities with intermittent claudication, unspecified extremity: Secondary | ICD-10-CM | POA: Diagnosis not present

## 2012-03-11 DIAGNOSIS — L98499 Non-pressure chronic ulcer of skin of other sites with unspecified severity: Secondary | ICD-10-CM | POA: Diagnosis not present

## 2012-03-11 DIAGNOSIS — E1159 Type 2 diabetes mellitus with other circulatory complications: Secondary | ICD-10-CM | POA: Diagnosis present

## 2012-03-11 DIAGNOSIS — E785 Hyperlipidemia, unspecified: Secondary | ICD-10-CM | POA: Diagnosis present

## 2012-03-11 DIAGNOSIS — Z23 Encounter for immunization: Secondary | ICD-10-CM

## 2012-03-11 DIAGNOSIS — L97509 Non-pressure chronic ulcer of other part of unspecified foot with unspecified severity: Secondary | ICD-10-CM | POA: Diagnosis not present

## 2012-03-11 DIAGNOSIS — I10 Essential (primary) hypertension: Secondary | ICD-10-CM | POA: Diagnosis present

## 2012-03-11 DIAGNOSIS — Z823 Family history of stroke: Secondary | ICD-10-CM | POA: Diagnosis not present

## 2012-03-11 DIAGNOSIS — I2789 Other specified pulmonary heart diseases: Secondary | ICD-10-CM | POA: Diagnosis present

## 2012-03-11 DIAGNOSIS — Z8249 Family history of ischemic heart disease and other diseases of the circulatory system: Secondary | ICD-10-CM | POA: Diagnosis not present

## 2012-03-11 DIAGNOSIS — Z860100 Personal history of colon polyps, unspecified: Secondary | ICD-10-CM

## 2012-03-11 DIAGNOSIS — I252 Old myocardial infarction: Secondary | ICD-10-CM

## 2012-03-11 DIAGNOSIS — I70269 Atherosclerosis of native arteries of extremities with gangrene, unspecified extremity: Secondary | ICD-10-CM | POA: Diagnosis present

## 2012-03-11 DIAGNOSIS — I509 Heart failure, unspecified: Secondary | ICD-10-CM | POA: Diagnosis present

## 2012-03-11 DIAGNOSIS — Z833 Family history of diabetes mellitus: Secondary | ICD-10-CM

## 2012-03-11 DIAGNOSIS — Z9861 Coronary angioplasty status: Secondary | ICD-10-CM

## 2012-03-11 DIAGNOSIS — IMO0002 Reserved for concepts with insufficient information to code with codable children: Secondary | ICD-10-CM | POA: Diagnosis not present

## 2012-03-11 DIAGNOSIS — I96 Gangrene, not elsewhere classified: Secondary | ICD-10-CM | POA: Diagnosis not present

## 2012-03-11 DIAGNOSIS — I739 Peripheral vascular disease, unspecified: Secondary | ICD-10-CM | POA: Diagnosis not present

## 2012-03-11 DIAGNOSIS — M79609 Pain in unspecified limb: Secondary | ICD-10-CM | POA: Diagnosis not present

## 2012-03-11 DIAGNOSIS — I70509 Unspecified atherosclerosis of nonautologous biological bypass graft(s) of the extremities, unspecified extremity: Secondary | ICD-10-CM | POA: Diagnosis not present

## 2012-03-11 DIAGNOSIS — L97909 Non-pressure chronic ulcer of unspecified part of unspecified lower leg with unspecified severity: Secondary | ICD-10-CM | POA: Diagnosis not present

## 2012-03-11 HISTORY — PX: FEMORAL-POPLITEAL BYPASS GRAFT: SHX937

## 2012-03-11 HISTORY — PX: INTRAOPERATIVE ARTERIOGRAM: SHX5157

## 2012-03-11 HISTORY — PX: FEMORAL-TIBIAL BYPASS GRAFT: SHX938

## 2012-03-11 LAB — POCT I-STAT 4, (NA,K, GLUC, HGB,HCT)
Glucose, Bld: 124 mg/dL — ABNORMAL HIGH (ref 70–99)
HCT: 35 % — ABNORMAL LOW (ref 39.0–52.0)

## 2012-03-11 LAB — URINE MICROSCOPIC-ADD ON

## 2012-03-11 LAB — GLUCOSE, CAPILLARY: Glucose-Capillary: 104 mg/dL — ABNORMAL HIGH (ref 70–99)

## 2012-03-11 LAB — CBC
Hemoglobin: 10.4 g/dL — ABNORMAL LOW (ref 13.0–17.0)
MCH: 27 pg (ref 26.0–34.0)
MCV: 82.1 fL (ref 78.0–100.0)
RBC: 3.85 MIL/uL — ABNORMAL LOW (ref 4.22–5.81)

## 2012-03-11 LAB — URINALYSIS, ROUTINE W REFLEX MICROSCOPIC
Hgb urine dipstick: NEGATIVE
Specific Gravity, Urine: 1.03 (ref 1.005–1.030)
Urobilinogen, UA: 1 mg/dL (ref 0.0–1.0)
pH: 5 (ref 5.0–8.0)

## 2012-03-11 SURGERY — CREATION, BYPASS, ARTERIAL, FEMORAL TO TIBIAL, USING GRAFT
Anesthesia: General | Site: Leg Upper | Laterality: Left | Wound class: Clean

## 2012-03-11 SURGERY — BYPASS GRAFT FEMORAL-POPLITEAL ARTERY
Anesthesia: General | Site: Leg Lower | Laterality: Left | Wound class: Clean

## 2012-03-11 MED ORDER — LIDOCAINE HCL (CARDIAC) 20 MG/ML IV SOLN
INTRAVENOUS | Status: DC | PRN
  Administered 2012-03-11: 70 mg via INTRAVENOUS

## 2012-03-11 MED ORDER — HEPARIN SODIUM (PORCINE) 1000 UNIT/ML IJ SOLN
INTRAMUSCULAR | Status: DC | PRN
  Administered 2012-03-11: 2000 [IU] via INTRAVENOUS
  Administered 2012-03-11: 6000 [IU] via INTRAVENOUS

## 2012-03-11 MED ORDER — ASPIRIN EC 81 MG PO TBEC
81.0000 mg | DELAYED_RELEASE_TABLET | Freq: Every day | ORAL | Status: DC
  Administered 2012-03-11 – 2012-03-19 (×9): 81 mg via ORAL
  Filled 2012-03-11 (×10): qty 1

## 2012-03-11 MED ORDER — GUAIFENESIN-DM 100-10 MG/5ML PO SYRP
15.0000 mL | ORAL_SOLUTION | ORAL | Status: DC | PRN
  Administered 2012-03-13 – 2012-03-19 (×2): 15 mL via ORAL
  Filled 2012-03-11: qty 5
  Filled 2012-03-11: qty 10
  Filled 2012-03-11: qty 5

## 2012-03-11 MED ORDER — ACETAMINOPHEN 325 MG RE SUPP
325.0000 mg | RECTAL | Status: DC | PRN
  Filled 2012-03-11: qty 2

## 2012-03-11 MED ORDER — HYDRALAZINE HCL 20 MG/ML IJ SOLN
10.0000 mg | INTRAMUSCULAR | Status: DC | PRN
  Administered 2012-03-17: 10 mg via INTRAVENOUS
  Filled 2012-03-11 (×2): qty 0.5

## 2012-03-11 MED ORDER — NEOSTIGMINE METHYLSULFATE 1 MG/ML IJ SOLN
INTRAMUSCULAR | Status: DC | PRN
  Administered 2012-03-11: 3 mg via INTRAVENOUS

## 2012-03-11 MED ORDER — SODIUM CHLORIDE 0.9 % IV SOLN
INTRAVENOUS | Status: DC | PRN
  Administered 2012-03-11: 14:00:00 via INTRAVENOUS

## 2012-03-11 MED ORDER — 0.9 % SODIUM CHLORIDE (POUR BTL) OPTIME
TOPICAL | Status: DC | PRN
  Administered 2012-03-11: 2000 mL

## 2012-03-11 MED ORDER — PANTOPRAZOLE SODIUM 40 MG PO TBEC
40.0000 mg | DELAYED_RELEASE_TABLET | Freq: Every day | ORAL | Status: DC
  Administered 2012-03-12 – 2012-03-19 (×8): 40 mg via ORAL
  Filled 2012-03-11 (×8): qty 1

## 2012-03-11 MED ORDER — PHENYLEPHRINE HCL 10 MG/ML IJ SOLN
10.0000 mg | INTRAVENOUS | Status: DC | PRN
  Administered 2012-03-11: 20 ug/min via INTRAVENOUS

## 2012-03-11 MED ORDER — HYDROMORPHONE HCL PF 1 MG/ML IJ SOLN
0.2500 mg | INTRAMUSCULAR | Status: DC | PRN
  Administered 2012-03-11 (×3): 0.5 mg via INTRAVENOUS

## 2012-03-11 MED ORDER — METOPROLOL TARTRATE 1 MG/ML IV SOLN: 2.0000 mg | INTRAVENOUS | Status: DC | PRN

## 2012-03-11 MED ORDER — LACTATED RINGERS IV SOLN
INTRAVENOUS | Status: DC | PRN
  Administered 2012-03-11 (×3): via INTRAVENOUS

## 2012-03-11 MED ORDER — PROPOFOL 10 MG/ML IV EMUL
INTRAVENOUS | Status: DC | PRN
  Administered 2012-03-11: 150 mg via INTRAVENOUS

## 2012-03-11 MED ORDER — ROCURONIUM BROMIDE 100 MG/10ML IV SOLN
INTRAVENOUS | Status: DC | PRN
  Administered 2012-03-11: 50 mg via INTRAVENOUS

## 2012-03-11 MED ORDER — PROTAMINE SULFATE 10 MG/ML IV SOLN
INTRAVENOUS | Status: DC | PRN
  Administered 2012-03-11: 50 mg via INTRAVENOUS

## 2012-03-11 MED ORDER — GABAPENTIN 300 MG PO CAPS
300.0000 mg | ORAL_CAPSULE | Freq: Three times a day (TID) | ORAL | Status: DC
  Administered 2012-03-11 – 2012-03-19 (×22): 300 mg via ORAL
  Filled 2012-03-11 (×25): qty 1

## 2012-03-11 MED ORDER — AMANTADINE HCL 100 MG PO CAPS
200.0000 mg | ORAL_CAPSULE | Freq: Three times a day (TID) | ORAL | Status: DC
  Administered 2012-03-11 – 2012-03-19 (×22): 200 mg via ORAL
  Filled 2012-03-11 (×25): qty 2

## 2012-03-11 MED ORDER — SIMVASTATIN 40 MG PO TABS
40.0000 mg | ORAL_TABLET | Freq: Every day | ORAL | Status: DC
  Administered 2012-03-11 – 2012-03-18 (×8): 40 mg via ORAL
  Filled 2012-03-11 (×10): qty 1

## 2012-03-11 MED ORDER — HALOPERIDOL LACTATE 5 MG/ML IJ SOLN
1.0000 mg | INTRAMUSCULAR | Status: DC | PRN
  Filled 2012-03-11: qty 0.2

## 2012-03-11 MED ORDER — SODIUM CHLORIDE 0.9 % IV SOLN
INTRAVENOUS | Status: DC
  Administered 2012-03-13: 02:00:00 via INTRAVENOUS

## 2012-03-11 MED ORDER — THROMBIN 20000 UNITS EX SOLR
CUTANEOUS | Status: DC | PRN
  Administered 2012-03-11: 19:00:00 via TOPICAL

## 2012-03-11 MED ORDER — THROMBIN 20000 UNITS EX SOLR
CUTANEOUS | Status: AC
  Filled 2012-03-11: qty 20000

## 2012-03-11 MED ORDER — IOHEXOL 300 MG/ML  SOLN
INTRAMUSCULAR | Status: AC
  Filled 2012-03-11: qty 1

## 2012-03-11 MED ORDER — ALUM & MAG HYDROXIDE-SIMETH 200-200-20 MG/5ML PO SUSP: 15.0000 mL | ORAL | Status: DC | PRN

## 2012-03-11 MED ORDER — SODIUM CHLORIDE 0.9 % IR SOLN
Status: DC | PRN
  Administered 2012-03-11: 10:00:00

## 2012-03-11 MED ORDER — IOHEXOL 300 MG/ML  SOLN
INTRAMUSCULAR | Status: DC | PRN
  Administered 2012-03-11: 25 mL via INTRAVENOUS

## 2012-03-11 MED ORDER — OXYCODONE HCL 5 MG PO TABS
5.0000 mg | ORAL_TABLET | ORAL | Status: DC | PRN
  Administered 2012-03-12 – 2012-03-13 (×3): 10 mg via ORAL
  Administered 2012-03-13: 5 mg via ORAL
  Administered 2012-03-14 – 2012-03-18 (×8): 10 mg via ORAL
  Filled 2012-03-11: qty 1
  Filled 2012-03-11 (×11): qty 2

## 2012-03-11 MED ORDER — SENNOSIDES-DOCUSATE SODIUM 8.6-50 MG PO TABS
1.0000 | ORAL_TABLET | Freq: Every evening | ORAL | Status: DC | PRN
  Administered 2012-03-14: 1 via ORAL
  Filled 2012-03-11: qty 1

## 2012-03-11 MED ORDER — HYDROMORPHONE HCL PF 1 MG/ML IJ SOLN
INTRAMUSCULAR | Status: AC
  Filled 2012-03-11: qty 1

## 2012-03-11 MED ORDER — ONDANSETRON HCL 4 MG/2ML IJ SOLN
INTRAMUSCULAR | Status: DC | PRN
  Administered 2012-03-11: 4 mg via INTRAVENOUS

## 2012-03-11 MED ORDER — SODIUM CHLORIDE 0.9 % IV SOLN
750.0000 mg | Freq: Two times a day (BID) | INTRAVENOUS | Status: DC
  Administered 2012-03-12 – 2012-03-18 (×13): 750 mg via INTRAVENOUS
  Filled 2012-03-11 (×15): qty 750

## 2012-03-11 MED ORDER — DOCUSATE SODIUM 100 MG PO CAPS
100.0000 mg | ORAL_CAPSULE | Freq: Every day | ORAL | Status: DC
  Administered 2012-03-12 – 2012-03-19 (×8): 100 mg via ORAL
  Filled 2012-03-11 (×8): qty 1

## 2012-03-11 MED ORDER — SODIUM CHLORIDE 0.9 % IR SOLN
Status: DC | PRN
  Administered 2012-03-11: 17:00:00

## 2012-03-11 MED ORDER — ROCURONIUM BROMIDE 100 MG/10ML IV SOLN
INTRAVENOUS | Status: DC | PRN
  Administered 2012-03-11: 40 mg via INTRAVENOUS

## 2012-03-11 MED ORDER — BISACODYL 10 MG RE SUPP: 10.0000 mg | Freq: Every day | RECTAL | Status: DC | PRN

## 2012-03-11 MED ORDER — ONDANSETRON HCL 4 MG/2ML IJ SOLN: 4.0000 mg | Freq: Four times a day (QID) | INTRAMUSCULAR | Status: DC | PRN

## 2012-03-11 MED ORDER — PHENOL 1.4 % MT LIQD: 1.0000 | OROMUCOSAL | Status: DC | PRN

## 2012-03-11 MED ORDER — DOPAMINE-DEXTROSE 3.2-5 MG/ML-% IV SOLN: 3.0000 ug/kg/min | INTRAVENOUS | Status: DC

## 2012-03-11 MED ORDER — METOPROLOL SUCCINATE ER 25 MG PO TB24
25.0000 mg | ORAL_TABLET | Freq: Every day | ORAL | Status: DC
  Administered 2012-03-11 – 2012-03-19 (×9): 25 mg via ORAL
  Filled 2012-03-11 (×10): qty 1

## 2012-03-11 MED ORDER — MORPHINE SULFATE 2 MG/ML IJ SOLN
2.0000 mg | INTRAMUSCULAR | Status: DC | PRN
  Administered 2012-03-14 – 2012-03-17 (×7): 4 mg via INTRAVENOUS
  Administered 2012-03-18 (×2): 2 mg via INTRAVENOUS
  Filled 2012-03-11 (×4): qty 2
  Filled 2012-03-11: qty 1
  Filled 2012-03-11: qty 2
  Filled 2012-03-11: qty 1
  Filled 2012-03-11: qty 2
  Filled 2012-03-11: qty 1
  Filled 2012-03-11: qty 2

## 2012-03-11 MED ORDER — ENOXAPARIN SODIUM 40 MG/0.4ML ~~LOC~~ SOLN
40.0000 mg | SUBCUTANEOUS | Status: DC
  Administered 2012-03-12 – 2012-03-19 (×7): 40 mg via SUBCUTANEOUS
  Filled 2012-03-11 (×8): qty 0.4

## 2012-03-11 MED ORDER — LIDOCAINE HCL (CARDIAC) 20 MG/ML IV SOLN
INTRAVENOUS | Status: DC | PRN
  Administered 2012-03-11: 80 mg via INTRAVENOUS

## 2012-03-11 MED ORDER — INSULIN ASPART PROT & ASPART (70-30 MIX) 100 UNIT/ML ~~LOC~~ SUSP
10.0000 [IU] | Freq: Every day | SUBCUTANEOUS | Status: DC
  Administered 2012-03-12: 10 [IU] via SUBCUTANEOUS
  Filled 2012-03-11: qty 3

## 2012-03-11 MED ORDER — PHENYLEPHRINE HCL 10 MG/ML IJ SOLN
INTRAMUSCULAR | Status: DC | PRN
  Administered 2012-03-11: 80 ug via INTRAVENOUS
  Administered 2012-03-11: 120 ug via INTRAVENOUS

## 2012-03-11 MED ORDER — PAPAVERINE HCL 30 MG/ML IJ SOLN
INTRAMUSCULAR | Status: AC
  Filled 2012-03-11: qty 2

## 2012-03-11 MED ORDER — FLEET ENEMA 7-19 GM/118ML RE ENEM: 1.0000 | ENEMA | Freq: Once | RECTAL | Status: AC | PRN

## 2012-03-11 MED ORDER — ACETAMINOPHEN 325 MG PO TABS: 325.0000 mg | ORAL_TABLET | ORAL | Status: DC | PRN

## 2012-03-11 MED ORDER — INSULIN ASPART 100 UNIT/ML ~~LOC~~ SOLN
0.0000 [IU] | Freq: Three times a day (TID) | SUBCUTANEOUS | Status: DC
  Administered 2012-03-12: 2 [IU] via SUBCUTANEOUS
  Administered 2012-03-12: 3 [IU] via SUBCUTANEOUS
  Administered 2012-03-13 – 2012-03-14 (×2): 2 [IU] via SUBCUTANEOUS
  Administered 2012-03-15 – 2012-03-16 (×2): 3 [IU] via SUBCUTANEOUS
  Administered 2012-03-16: 5 [IU] via SUBCUTANEOUS
  Administered 2012-03-18: 3 [IU] via SUBCUTANEOUS

## 2012-03-11 MED ORDER — ALBUMIN HUMAN 5 % IV SOLN
INTRAVENOUS | Status: DC | PRN
  Administered 2012-03-11: 09:00:00 via INTRAVENOUS

## 2012-03-11 MED ORDER — MIDAZOLAM HCL 5 MG/5ML IJ SOLN
INTRAMUSCULAR | Status: DC | PRN
  Administered 2012-03-11: 2 mg via INTRAVENOUS

## 2012-03-11 MED ORDER — VANCOMYCIN HCL 1000 MG IV SOLR
1000.0000 mg | INTRAVENOUS | Status: DC | PRN
  Administered 2012-03-11: 1000 mg via INTRAVENOUS

## 2012-03-11 MED ORDER — SODIUM CHLORIDE 0.9 % IV SOLN: 500.0000 mL | Freq: Once | INTRAVENOUS | Status: AC | PRN

## 2012-03-11 MED ORDER — INSULIN ASPART PROT & ASPART (70-30 MIX) 100 UNIT/ML ~~LOC~~ SUSP: 10.0000 [IU] | Freq: Two times a day (BID) | SUBCUTANEOUS | Status: DC

## 2012-03-11 MED ORDER — EPHEDRINE SULFATE 50 MG/ML IJ SOLN
INTRAMUSCULAR | Status: DC | PRN
  Administered 2012-03-11: 10 mg via INTRAVENOUS
  Administered 2012-03-11: 15 mg via INTRAVENOUS

## 2012-03-11 MED ORDER — FENTANYL CITRATE 0.05 MG/ML IJ SOLN
INTRAMUSCULAR | Status: DC | PRN
  Administered 2012-03-11: 50 ug via INTRAVENOUS
  Administered 2012-03-11: 100 ug via INTRAVENOUS
  Administered 2012-03-11: 50 ug via INTRAVENOUS

## 2012-03-11 MED ORDER — PROPOFOL 10 MG/ML IV EMUL
INTRAVENOUS | Status: DC | PRN
  Administered 2012-03-11: 140 mg via INTRAVENOUS

## 2012-03-11 MED ORDER — MAGNESIUM SULFATE 40 MG/ML IJ SOLN: 2.0000 g | Freq: Once | INTRAMUSCULAR | Status: AC | PRN

## 2012-03-11 MED ORDER — THROMBIN 20000 UNITS EX SOLR
CUTANEOUS | Status: DC | PRN
  Administered 2012-03-11 (×2): via TOPICAL

## 2012-03-11 MED ORDER — GLYCOPYRROLATE 0.2 MG/ML IJ SOLN
INTRAMUSCULAR | Status: DC | PRN
  Administered 2012-03-11: .4 mg via INTRAVENOUS

## 2012-03-11 MED ORDER — LABETALOL HCL 5 MG/ML IV SOLN
10.0000 mg | INTRAVENOUS | Status: DC | PRN
  Filled 2012-03-11: qty 4

## 2012-03-11 MED ORDER — PIPERACILLIN-TAZOBACTAM 3.375 G IVPB
3.3750 g | Freq: Three times a day (TID) | INTRAVENOUS | Status: DC
  Administered 2012-03-11 – 2012-03-17 (×18): 3.375 g via INTRAVENOUS
  Filled 2012-03-11 (×22): qty 50

## 2012-03-11 MED ORDER — VANCOMYCIN HCL IN DEXTROSE 1-5 GM/200ML-% IV SOLN
INTRAVENOUS | Status: AC
  Filled 2012-03-11: qty 200

## 2012-03-11 MED ORDER — PHENYLEPHRINE HCL 10 MG/ML IJ SOLN
10.0000 mg | INTRAVENOUS | Status: DC | PRN
  Administered 2012-03-11: 25 ug/min via INTRAVENOUS

## 2012-03-11 MED ORDER — HEPARIN SODIUM (PORCINE) 1000 UNIT/ML IJ SOLN
INTRAMUSCULAR | Status: DC | PRN
  Administered 2012-03-11: 2000 [IU] via INTRAVENOUS
  Administered 2012-03-11: 6000 [IU] via INTRAVENOUS
  Administered 2012-03-11: 2000 [IU] via INTRAVENOUS

## 2012-03-11 MED ORDER — POTASSIUM CHLORIDE CRYS ER 20 MEQ PO TBCR: 20.0000 meq | EXTENDED_RELEASE_TABLET | Freq: Once | ORAL | Status: AC | PRN

## 2012-03-11 MED ORDER — FENTANYL CITRATE 0.05 MG/ML IJ SOLN
INTRAMUSCULAR | Status: DC | PRN
  Administered 2012-03-11 (×6): 25 ug via INTRAVENOUS
  Administered 2012-03-11: 200 ug via INTRAVENOUS

## 2012-03-11 MED ORDER — INSULIN ASPART PROT & ASPART (70-30 MIX) 100 UNIT/ML ~~LOC~~ SUSP
30.0000 [IU] | Freq: Every day | SUBCUTANEOUS | Status: DC
  Administered 2012-03-12 – 2012-03-19 (×6): 30 [IU] via SUBCUTANEOUS
  Filled 2012-03-11: qty 3

## 2012-03-11 SURGICAL SUPPLY — 73 items
BANDAGE ELASTIC 4 VELCRO ST LF (GAUZE/BANDAGES/DRESSINGS) IMPLANT
BANDAGE ESMARK 6X9 LF (GAUZE/BANDAGES/DRESSINGS) ×2 IMPLANT
BANDAGE GAUZE ELAST BULKY 4 IN (GAUZE/BANDAGES/DRESSINGS) ×3 IMPLANT
BNDG ESMARK 6X9 LF (GAUZE/BANDAGES/DRESSINGS) ×3
CANISTER SUCTION 2500CC (MISCELLANEOUS) ×3 IMPLANT
CLIP TI MEDIUM 24 (CLIP) ×3 IMPLANT
CLIP TI WIDE RED SMALL 24 (CLIP) ×3 IMPLANT
CLOTH BEACON ORANGE TIMEOUT ST (SAFETY) ×3 IMPLANT
COVER DOME SNAP 22 D (MISCELLANEOUS) ×3 IMPLANT
COVER PROBE W GEL 5X96 (DRAPES) ×3 IMPLANT
COVER SURGICAL LIGHT HANDLE (MISCELLANEOUS) ×3 IMPLANT
CUFF TOURNIQUET SINGLE 18IN (TOURNIQUET CUFF) IMPLANT
CUFF TOURNIQUET SINGLE 24IN (TOURNIQUET CUFF) IMPLANT
CUFF TOURNIQUET SINGLE 34IN LL (TOURNIQUET CUFF) ×3 IMPLANT
CUFF TOURNIQUET SINGLE 44IN (TOURNIQUET CUFF) IMPLANT
DERMABOND ADVANCED (GAUZE/BANDAGES/DRESSINGS) ×2
DERMABOND ADVANCED .7 DNX12 (GAUZE/BANDAGES/DRESSINGS) ×4 IMPLANT
DRAIN CHANNEL 15F RND FF W/TCR (WOUND CARE) IMPLANT
DRAPE C-ARM 42X72 X-RAY (DRAPES) ×3 IMPLANT
DRAPE PROXIMA HALF (DRAPES) ×3 IMPLANT
DRAPE WARM FLUID 44X44 (DRAPE) ×3 IMPLANT
DRSG COVADERM 4X8 (GAUZE/BANDAGES/DRESSINGS) IMPLANT
ELECT REM PT RETURN 9FT ADLT (ELECTROSURGICAL) ×3
ELECTRODE REM PT RTRN 9FT ADLT (ELECTROSURGICAL) ×2 IMPLANT
EVACUATOR SILICONE 100CC (DRAIN) IMPLANT
GAUZE SPONGE 4X4 16PLY XRAY LF (GAUZE/BANDAGES/DRESSINGS) ×3 IMPLANT
GLOVE BIO SURGEON STRL SZ 6 (GLOVE) ×6 IMPLANT
GLOVE BIO SURGEON STRL SZ 6.5 (GLOVE) ×9 IMPLANT
GLOVE BIO SURGEON STRL SZ7 (GLOVE) ×9 IMPLANT
GLOVE BIOGEL PI IND STRL 6.5 (GLOVE) ×10 IMPLANT
GLOVE BIOGEL PI IND STRL 7.0 (GLOVE) ×6 IMPLANT
GLOVE BIOGEL PI IND STRL 7.5 (GLOVE) ×6 IMPLANT
GLOVE BIOGEL PI INDICATOR 6.5 (GLOVE) ×5
GLOVE BIOGEL PI INDICATOR 7.0 (GLOVE) ×3
GLOVE BIOGEL PI INDICATOR 7.5 (GLOVE) ×3
GLOVE ECLIPSE 6.5 STRL STRAW (GLOVE) ×9 IMPLANT
GLOVE ECLIPSE 7.5 STRL STRAW (GLOVE) ×6 IMPLANT
GOWN PREVENTION PLUS XLARGE (GOWN DISPOSABLE) ×3 IMPLANT
GOWN STRL NON-REIN LRG LVL3 (GOWN DISPOSABLE) ×27 IMPLANT
GRAFT PROPATEN W/RING 6X80X60 (Vascular Products) ×3 IMPLANT
KIT BASIN OR (CUSTOM PROCEDURE TRAY) ×3 IMPLANT
KIT ROOM TURNOVER OR (KITS) ×3 IMPLANT
MARKER GRAFT CORONARY BYPASS (MISCELLANEOUS) IMPLANT
NS IRRIG 1000ML POUR BTL (IV SOLUTION) ×6 IMPLANT
OMNIPAQUE 300MG/ML (50ML) ×3 IMPLANT
PACK PERIPHERAL VASCULAR (CUSTOM PROCEDURE TRAY) ×3 IMPLANT
PAD ARMBOARD 7.5X6 YLW CONV (MISCELLANEOUS) ×6 IMPLANT
PADDING CAST COTTON 6X4 STRL (CAST SUPPLIES) ×3 IMPLANT
SET MICROPUNCTURE 5F STIFF (MISCELLANEOUS) ×3 IMPLANT
SPONGE INTESTINAL PEANUT (DISPOSABLE) ×3 IMPLANT
SPONGE SURGIFOAM ABS GEL 100 (HEMOSTASIS) IMPLANT
STAPLER VISISTAT 35W (STAPLE) IMPLANT
STOPCOCK 4 WAY LG BORE MALE ST (IV SETS) ×3 IMPLANT
SUT GORETEX 5 0 TT13 24 (SUTURE) ×6 IMPLANT
SUT MNCRL AB 4-0 PS2 18 (SUTURE) ×3 IMPLANT
SUT PROLENE 5 0 C 1 24 (SUTURE) ×6 IMPLANT
SUT PROLENE 6 0 BV (SUTURE) ×33 IMPLANT
SUT PROLENE 7 0 BV 1 (SUTURE) ×9 IMPLANT
SUT SILK 2 0 FS (SUTURE) IMPLANT
SUT SILK 2 0 SH (SUTURE) ×3 IMPLANT
SUT SILK 3 0 (SUTURE)
SUT SILK 3-0 18XBRD TIE 12 (SUTURE) IMPLANT
SUT VIC AB 2-0 CT1 27 (SUTURE) ×2
SUT VIC AB 2-0 CT1 TAPERPNT 27 (SUTURE) ×4 IMPLANT
SUT VIC AB 3-0 SH 27 (SUTURE) ×4
SUT VIC AB 3-0 SH 27X BRD (SUTURE) ×8 IMPLANT
TOWEL OR 17X24 6PK STRL BLUE (TOWEL DISPOSABLE) ×6 IMPLANT
TOWEL OR 17X26 10 PK STRL BLUE (TOWEL DISPOSABLE) ×6 IMPLANT
TRAY FOLEY CATH 14FRSI W/METER (CATHETERS) ×3 IMPLANT
TUBING EXTENTION W/L.L. (IV SETS) ×3 IMPLANT
UNDERPAD 30X30 INCONTINENT (UNDERPADS AND DIAPERS) ×3 IMPLANT
WATER STERILE IRR 1000ML POUR (IV SOLUTION) ×3 IMPLANT
WIRE MICRO SET SILHO 5FR 7 (SHEATH) ×3 IMPLANT

## 2012-03-11 SURGICAL SUPPLY — 66 items
BANDAGE ELASTIC 4 VELCRO ST LF (GAUZE/BANDAGES/DRESSINGS) IMPLANT
BANDAGE ESMARK 6X9 LF (GAUZE/BANDAGES/DRESSINGS) IMPLANT
BNDG ESMARK 6X9 LF (GAUZE/BANDAGES/DRESSINGS)
CANISTER SUCTION 2500CC (MISCELLANEOUS) ×2 IMPLANT
CATH EMB 3FR 80CM (CATHETERS) ×2 IMPLANT
CATH EMB 4FR 80CM (CATHETERS) ×2 IMPLANT
CHERRY SPONGEY 1/2 (GAUZE/BANDAGES/DRESSINGS) ×2 IMPLANT
CLIP TI MEDIUM 24 (CLIP) ×2 IMPLANT
CLIP TI MEDIUM 6 (CLIP) ×2 IMPLANT
CLIP TI WIDE RED SMALL 24 (CLIP) ×2 IMPLANT
CLOTH BEACON ORANGE TIMEOUT ST (SAFETY) ×2 IMPLANT
COVER PROBE W GEL 5X96 (DRAPES) ×2 IMPLANT
COVER SURGICAL LIGHT HANDLE (MISCELLANEOUS) ×2 IMPLANT
CUFF TOURNIQUET SINGLE 24IN (TOURNIQUET CUFF) IMPLANT
CUFF TOURNIQUET SINGLE 34IN LL (TOURNIQUET CUFF) IMPLANT
CUFF TOURNIQUET SINGLE 44IN (TOURNIQUET CUFF) IMPLANT
DERMABOND ADVANCED (GAUZE/BANDAGES/DRESSINGS) ×1
DERMABOND ADVANCED .7 DNX12 (GAUZE/BANDAGES/DRESSINGS) ×1 IMPLANT
DRAIN CHANNEL 15F RND FF W/TCR (WOUND CARE) IMPLANT
DRAPE C-ARM 42X72 X-RAY (DRAPES) ×2 IMPLANT
DRAPE WARM FLUID 44X44 (DRAPE) ×2 IMPLANT
DRSG COVADERM 4X10 (GAUZE/BANDAGES/DRESSINGS) IMPLANT
DRSG COVADERM 4X8 (GAUZE/BANDAGES/DRESSINGS) IMPLANT
ELECT REM PT RETURN 9FT ADLT (ELECTROSURGICAL) ×2
ELECTRODE REM PT RTRN 9FT ADLT (ELECTROSURGICAL) ×1 IMPLANT
EVACUATOR SILICONE 100CC (DRAIN) IMPLANT
GLOVE BIO SURGEON STRL SZ7 (GLOVE) ×2 IMPLANT
GLOVE BIOGEL PI IND STRL 6.5 (GLOVE) ×2 IMPLANT
GLOVE BIOGEL PI IND STRL 7.5 (GLOVE) ×1 IMPLANT
GLOVE BIOGEL PI INDICATOR 6.5 (GLOVE) ×2
GLOVE BIOGEL PI INDICATOR 7.5 (GLOVE) ×1
GOWN STRL NON-REIN LRG LVL3 (GOWN DISPOSABLE) ×6 IMPLANT
INSERT FOGARTY SM (MISCELLANEOUS) IMPLANT
KIT BASIN OR (CUSTOM PROCEDURE TRAY) ×2 IMPLANT
KIT ROOM TURNOVER OR (KITS) ×2 IMPLANT
MARKER GRAFT CORONARY BYPASS (MISCELLANEOUS) IMPLANT
NS IRRIG 1000ML POUR BTL (IV SOLUTION) ×4 IMPLANT
PACK PERIPHERAL VASCULAR (CUSTOM PROCEDURE TRAY) ×2 IMPLANT
PAD ARMBOARD 7.5X6 YLW CONV (MISCELLANEOUS) ×4 IMPLANT
PADDING CAST COTTON 6X4 STRL (CAST SUPPLIES) IMPLANT
PATCH VASCULAR VASCU GUARD 1X6 (Vascular Products) ×2 IMPLANT
SET MICROPUNCTURE 5F STIFF (MISCELLANEOUS) ×2 IMPLANT
SPONGE SURGIFOAM ABS GEL 100 (HEMOSTASIS) ×4 IMPLANT
STAPLER VISISTAT 35W (STAPLE) IMPLANT
STOPCOCK 4 WAY LG BORE MALE ST (IV SETS) ×4 IMPLANT
SUT ETHILON 3 0 PS 1 (SUTURE) IMPLANT
SUT GORETEX 5 0 TT13 24 (SUTURE) IMPLANT
SUT GORETEX 6.0 TT13 (SUTURE) IMPLANT
SUT MNCRL AB 4-0 PS2 18 (SUTURE) ×6 IMPLANT
SUT PROLENE 5 0 C 1 24 (SUTURE) ×2 IMPLANT
SUT PROLENE 6 0 BV (SUTURE) ×16 IMPLANT
SUT PROLENE 7 0 BV 1 (SUTURE) ×4 IMPLANT
SUT SILK 2 0 FS (SUTURE) ×2 IMPLANT
SUT SILK 3 0 (SUTURE)
SUT SILK 3-0 18XBRD TIE 12 (SUTURE) IMPLANT
SUT VIC AB 2-0 CT1 27 (SUTURE) ×2
SUT VIC AB 2-0 CT1 TAPERPNT 27 (SUTURE) ×2 IMPLANT
SUT VIC AB 3-0 SH 27 (SUTURE) ×3
SUT VIC AB 3-0 SH 27X BRD (SUTURE) ×3 IMPLANT
TOWEL OR 17X24 6PK STRL BLUE (TOWEL DISPOSABLE) ×4 IMPLANT
TOWEL OR 17X26 10 PK STRL BLUE (TOWEL DISPOSABLE) ×4 IMPLANT
TRAY FOLEY CATH 14FRSI W/METER (CATHETERS) ×2 IMPLANT
TUBING EXTENTION W/L.L. (IV SETS) ×4 IMPLANT
UNDERPAD 30X30 INCONTINENT (UNDERPADS AND DIAPERS) ×2 IMPLANT
WATER STERILE IRR 1000ML POUR (IV SOLUTION) ×2 IMPLANT
WIRE MICRO SET SILHO 5FR 7 (SHEATH) ×2 IMPLANT

## 2012-03-11 NOTE — OR Nursing (Signed)
Flourotime: 0.2 minutes, 144.4 mgy. Natasha Bence, RN

## 2012-03-11 NOTE — Anesthesia Procedure Notes (Signed)
Procedure Name: Intubation Date/Time: 03/11/2012 8:51 AM Performed by: Garen Lah Pre-anesthesia Checklist: Patient identified, Timeout performed, Emergency Drugs available, Suction available and Patient being monitored Patient Re-evaluated:Patient Re-evaluated prior to inductionOxygen Delivery Method: Circle system utilized Preoxygenation: Pre-oxygenation with 100% oxygen Intubation Type: IV induction Ventilation: Mask ventilation without difficulty Laryngoscope Size: Mac and 4 Grade View: Grade I Tube type: Oral Tube size: 8.0 mm Number of attempts: 1 Airway Equipment and Method: Stylet Placement Confirmation: ETT inserted through vocal cords under direct vision,  breath sounds checked- equal and bilateral and positive ETCO2 Secured at: 21 cm Tube secured with: Tape Dental Injury: Teeth and Oropharynx as per pre-operative assessment

## 2012-03-11 NOTE — Op Note (Signed)
OPERATIVE NOTE   PROCEDURE: 1. Thrombectomy of left femorotibioperoneal graft 2. Redo patch angioplasty of left tibioperoneal trunk  3. Patch angioplasty of left posterior tibial artery  4. Open cannulation of left femotrotibioperoneal bypass 5. Left leg angiogram  PRE-OPERATIVE DIAGNOSIS: Thrombosed left femorotibioperoneal graft, Acute left foot ischemia  POST-OPERATIVE DIAGNOSIS: same as above   SURGEON: Leonides Sake, MD  ASSISTANT(S): Della Goo, [facility]   ANESTHESIA: general  ESTIMATED BLOOD LOSS: 200 cc  FINDING(S): 1. Acute thrombosis of tibioperoneal trunk with concomittant distal femorotibioperoneal bypass thrombosis 2. Embolic debris in distal tibioperoneal trunk 3. Runoff to left foot via peroneal and posterior tibial artery.  Some evidence of embolic debris vs vasospasm in left posterior tibial artery.  SPECIMEN(S):  none  INDICATIONS:   Jaime [name] is a 71 y.o. male who  presents with acute onset of left foot ischemia.  As the patient had a faintly palpable posterior tibial artery pulse at the end of his prior procedure, I was concerned that he had embolized into his tibioperoneal trunk as the common femoral artery was quite diseased.  I recommended to him and his wife that he proceed with left leg exploration, possible thrombectomy, possible angiogram, and possible revascularization.  The risk, benefits, and alternative for bypass operations were discussed with the patient.  The patient is aware the risks include but are not limited to: bleeding, infection, myocardial infarction, stroke, limb loss, nerve damage, need for additional procedures in the future, wound complications, and inability to complete the bypass.  The patient is aware of these risks and agreed to proceed.   DESCRIPTION: After obtain full informed written consent, the patient was brought back to the operating room and placed supine upon the operating table.  The patient received IV antibiotics  prior to induction.  After obtaining adequate anesthesia, the patient was prepped and draped in the standard fashion for: left femoropopliteal bypass.  I took off the Dermabond off the popliteal exposure and sharply transected the subcuticular and subcutaneous stitches.  Immediately it became evident here was no pulse in the femoropoliteal bypass graft.  I sharply took the bypass graft off the patched tibioperoneal trunk.  There was acute clot in the tibioperoneal trunk with atherosclerotic debris suspicious for embolic debris.  I passed a 4 Fogarty proximally and extracted clot from the bypass graft.  There was return of strong pulsatile flow to this graft.  I clamped this graft and gave 6000 units of Heparin.  I then dissected out the posterior tibial artery distally until I found a relatively disease free segment, roughly in the mid-lower leg.  I then clamped off the distal posterior tibial artery and made an arteriotomy.  I extended this arteriotomy proximally, just distal to the previously patch tibioperoneal trunk.  It was evident there was a substantial amount of atherosclerotic debris and residual plaque in the proximal posterior tibial artery.  I elected to sharply take down the entire vein patch.  I then completed a redo endarterectomy of tibioperoneal trunk extending into the posterior tibial artery.  In this process, the proximal tibioperoneal trunk was thinned out enough to compromise wall integrity.  I elected to clip off this compromised segment of distal popliteal/proximal tibioperoneal trunk.  I obtained a bovine pericardial patch and cut it to the dimensions of this long arteriotomy from tibioperoneal trunk into proximal posterior tibial artery.  I allowed the distal posterior tibial artery to backbleed.  There appeared to be intact backbleeding.  I passed a 3 Fogarty into  the posterior tibial artery and there was no clot extracted but tactile evidence of significant stenoses in this posterior  tibial artery.  The bovine patch was sewn to the tibioperoneal trunk and proximal posterior tibial artery with two running stitch of 6-0 Prolene, tying in the middle.  Prior to completing the anastomosis, I allowed the posterior tibial artery to backbleed.  At this point, I removed all clamps and repaired a few areas of the patch with interrupted stitches of 6-0 Prolene.  I made an incision in the bovine patch and extended it to meet the dimensions of the bypass graft.  The bypass graft was sewn to the patched tibioperoneal trunk with a running stitch of 6-0 Prolene.  Prior to completing this anastomosis, I backbleed the posterior tibial artery and allowed the graft to bleed in an antegrade fashion.  The anastomosis was completed in the usual fashion.  I then cannulated the bypass graft with a micropuncture needle.  I passed the microwire into the graft and then exchanged the needle for the microsheath.  I removed the dilator and wire.  The sheath was connected to IV extension tubing.  I then did a complete angiogram, which demonstrated posterior tibial artery and peroneal runoff to the left foot.  However, there appeared to be filling defects in the posterior tibial artery consistent without either embolic disease or vasospasm.  I pulled the sheath and repaired the hole with a 6-0 stitch.  I then clamped the patch tibioperoneal trunk proximally and distally made an incision in the bovine patch, extending it longitudinally with a Pott's scissor to facilitate an embolectomy.  I released the distal clamp and  passed the 3 Fogarty distally, extracting no clot.  There was resistance upon pulling back the balloon consistent with atherosclerotic stenoses.  I reclamped the posterior tibial artery and repaired the bovine patch with a running stitch of 6-0 Prolene.  At this point, I checked the foot.  There was dopplerable posterior tibial artery and peroneal artery signal.  There was strong pulse in the bypass graft and  tibioperoneal trunk and posterior tibial artery.  The patient was given 30 mg of Protamine to reverse anticoagulation.  I placed thrombin and gelfoam in the surgical wound.  After waiting five minutes, I washed out the surgical wound and no further bleeding was noted.  I reapproximated the subcutaneous tissue with a running stitch of 3-0 Vicryl.  The skin was closed with a running subcuticular of 4-0 Monocryl.  The skin was clean, dried, and reinforced with Dermabond.    COMPLICATIONS: none  CONDITION: stable  Leonides Sake, MD [facility] Office: [phone] Pager: [phone]  03/11/2012, 8:05 PM

## 2012-03-11 NOTE — Anesthesia Postprocedure Evaluation (Signed)
Anesthesia Post Note  Patient: Jaime [name]  Procedure(s) Performed: Procedure(s) (LRB): BYPASS GRAFT FEMORAL-TIBIAL ARTERY (Left) INTRA OPERATIVE ARTERIOGRAM (Left)  Anesthesia type: General  Patient location: PACU  Post pain: Pain level controlled and Adequate analgesia  Post assessment: Post-op Vital signs reviewed, Patient's Cardiovascular Status Stable, Respiratory Function Stable, Patent Airway and Pain level controlled  Last Vitals:  Filed Vitals:   03/11/12 0702  BP: 156/97  Pulse: 88  Temp: 36.8 C  Resp: 18    Post vital signs: Reviewed and stable  Level of consciousness: awake, alert  and oriented  Complications: No apparent anesthesia complications

## 2012-03-11 NOTE — Anesthesia Procedure Notes (Signed)
Procedure Name: Intubation Date/Time: 03/11/2012 4:33 PM Performed by: Elon Alas Pre-anesthesia Checklist: Emergency Drugs available, Timeout performed, Patient identified, Suction available and Patient being monitored Patient Re-evaluated:Patient Re-evaluated prior to inductionOxygen Delivery Method: Circle system utilized Preoxygenation: Pre-oxygenation with 100% oxygen Intubation Type: IV induction Laryngoscope Size: Mac and 4 Grade View: Grade I Tube type: Oral Tube size: 7.5 mm Number of attempts: 1 Airway Equipment and Method: Stylet Placement Confirmation: positive ETCO2,  ETT inserted through vocal cords under direct vision and breath sounds checked- equal and bilateral Secured at: 23 cm Tube secured with: Tape Dental Injury: Teeth and Oropharynx as per pre-operative assessment

## 2012-03-11 NOTE — Anesthesia Postprocedure Evaluation (Signed)
  Anesthesia Post-op Note  Patient: Jaime [name]  Procedure(s) Performed: Procedure(s) (LRB): BYPASS GRAFT FEMORAL-POPLITEAL ARTERY (Left)  Patient Location: PACU  Anesthesia Type: General  Level of Consciousness: awake  Airway and Oxygen Therapy: Patient Spontanous Breathing  Post-op Pain: moderate  Post-op Assessment: Post-op Vital signs reviewed, Patient's Cardiovascular Status Stable and Respiratory Function Stable  Post-op Vital Signs: stable  Complications: No apparent anesthesia complications

## 2012-03-11 NOTE — Transfer of Care (Signed)
Immediate Anesthesia Transfer of Care Note  Patient: Jaime [name]  Procedure(s) Performed: Procedure(s) (LRB): BYPASS GRAFT FEMORAL-POPLITEAL ARTERY (Left)  Patient Location: PACU  Anesthesia Type: General  Level of Consciousness: awake, alert  and patient cooperative  Airway & Oxygen Therapy: Patient Spontanous Breathing and Patient connected to face mask oxygen  Post-op Assessment: Report given to PACU RN  Post vital signs: Reviewed and stable  Complications: No apparent anesthesia complications

## 2012-03-11 NOTE — Preoperative (Signed)
Beta Blockers   Reason not to administer Beta Blockers:Not Applicable, pt took lopressor 8/21

## 2012-03-11 NOTE — Transfer of Care (Signed)
Immediate Anesthesia Transfer of Care Note  Patient: Jaime [name]  Procedure(s) Performed: Procedure(s) (LRB): BYPASS GRAFT FEMORAL-TIBIAL ARTERY (Left) INTRA OPERATIVE ARTERIOGRAM (Left)  Patient Location: PACU  Anesthesia Type: General  Level of Consciousness: sedated  Airway & Oxygen Therapy: Patient Spontanous Breathing and Patient connected to nasal cannula oxygen  Post-op Assessment: Report given to PACU RN and Post -op Vital signs reviewed and stable  Post vital signs: Reviewed and stable  Complications: No apparent anesthesia complications

## 2012-03-11 NOTE — Progress Notes (Signed)
[facility]  Pt had acute loss of signals in left foot.  As he had a faintly palpable PT at the end of the case, I feel re-exploration is indicated.  I discussed with the family: left leg exploration, possible thrombectomy, possible angiogram, and possible revascularization (including possible jump graft to distal posterior tibial artery). The patient's family is aware the risks include but are not limited to: bleeding, infection, myocardial infarction, stroke, limb loss, nerve damage, need for additional procedures in the future, wound complications, and inability to complete the bypass.  The patient and family is aware of these risks and agreed to proceed.  Leonides Sake, MD [facility] Office: [phone] Pager: [phone]  03/11/2012, 4:18 PM

## 2012-03-11 NOTE — Anesthesia Preprocedure Evaluation (Addendum)
Anesthesia Evaluation  Patient identified by MRN, date of birth, ID band Patient awake    Reviewed: Allergy & Precautions, H&P , NPO status , Patient's Chart, lab work & pertinent test results, reviewed documented beta blocker date and time   Airway Mallampati: II  Neck ROM: full    Dental  (+) Edentulous Upper and Dental Advisory Given   Pulmonary pneumonia -, former smoker,          Cardiovascular hypertension, Pt. on medications and Pt. on home beta blockers + CAD, + Past MI, + Cardiac Stents and +CHF     Neuro/Psych PSYCHIATRIC DISORDERS Anxiety  Neuromuscular disease CVA    GI/Hepatic   Endo/Other  Well Controlled, Type 2, Insulin Dependent  Renal/GU      Musculoskeletal   Abdominal   Peds  Hematology   Anesthesia Other Findings   Reproductive/Obstetrics                          Anesthesia Physical Anesthesia Plan  ASA: III  Anesthesia Plan: General   Post-op Pain Management:    Induction: Intravenous  Airway Management Planned: Oral ETT  Additional Equipment:   Intra-op Plan:   Post-operative Plan: Extubation in OR  Informed Consent: I have reviewed the patients History and Physical, chart, labs and discussed the procedure including the risks, benefits and alternatives for the proposed anesthesia with the patient or authorized representative who has indicated his/her understanding and acceptance.   Dental advisory given  Plan Discussed with: CRNA and Surgeon  Anesthesia Plan Comments:        Anesthesia Quick Evaluation

## 2012-03-11 NOTE — H&P (View-Only) (Signed)
[facility] [place]  Established Critical Limb Ischemia Patient  History of Present Illness  Jaime [name] is a 71 y.o. (Aug 26, 1940) male who presents with chief complaint: left leg rest pain.  The patient has rest pain and right foot ischemic changes have worsened.  Patient denies any drainage or fever or chills.  The patient's treatment regimen currently included: maximal medical management and narcotics.  The patient has been undergoing preoperative cardiac work-up and preoperative optimization.  Per Dr. Myrtis Ser, patient is at least at moderate risk for cardiac complications..  Past Medical History  Diagnosis Date  . Hyperlipidemia   . Cardiomyopathy, ischemic     EF 45% per ECHO 2008  . Pulmonary hypertension     moderate ECHO Jan 2008  . CHF (congestive heart failure)   . Systolic heart failure   . Multiple sclerosis DX 1993    NEUROLOGIST-  DR DOHMEIER -- LAST VISIT 11-20-2010  NOTE W/ CHART  . Lung nodule     resolved 11-2006 CT Chest  . Tobacco abuse   . Increased prostate specific antigen (PSA) velocity   . Urinary retention     dx ~ 2-12, like from MS, now with a catheter, saw urology  . Ejection fraction      EF 45%, echo, 2008  . Cellulitis   . Pre-syncope     Near syncope, ER visit, October, 2011  . Carotid artery disease     Doppler, February, 2012, 0-39% bilateral,Mild smooth plaque  . Chronic indwelling foley catheter   . Scoliosis associated with other condition   . Hemiparesis   . Fatigue SEVERE  . Weakness   . Impotence   . MI, acute, non ST segment elevation 2007    S/P PCI WITH X1 STENT (TAXUS DRUG-ELUTING) LEFT CIRCUMFLEX  . Echocardiogram abnormal 08-17-2006    SEVERE HYPOKINESIS OF INFERIOR , POSTEROR WALLS OF BASE AND MID VENTRICLE AND MOD. PULMONARY HTN  . Diabetes mellitus     INSULIN-DEPENDANT  . CAD (coronary artery disease) CARDIOLOGIST- DR KATZ-- VISIT 06-05-2011 IN [facility]    non-STEMI, 2007.Marland Kitchenoccluded circumflex.. Taxus  stent placed...residual 80% LAD...50% RCA  . Hypertension   . H/O pleural effusion 2008    POST THORACENTESIS  . History of colon polyps PRECANCEROUS  . Insomnia   . Ingrown right big toenail CHRONIC    PER PT VERY SORE    Past Surgical History  Procedure Date  . Hernia repair 1990    (R)  . Coronary angioplasty with stent placement 05-01-2006    OCCLUDED CIRCUMFLEX -- TAXUS STENT PLACMENT  AND RESIDUAL 80% LAD,  50% RCA  . Thoracentesis 2008    PLEURAL EFFUSION  . Cystoscopy 07/30/2011    Procedure: CYSTOSCOPY;  Surgeon: Lindaann Slough, MD;  Location: [facility];  Service: Urology;  Laterality: N/A;  . Transurethral resection of prostate 07/30/2011    Procedure: TRANSURETHRAL RESECTION OF THE PROSTATE (TURP);  Surgeon: Lindaann Slough, MD;  Location: [facility];  Service: Urology;  Laterality: N/A;    History   Social History  . Marital Status: Married    Spouse Name: N/A    Number of Children: 2  . Years of Education: N/A   Occupational History  . disable    Social History Main Topics  . Smoking status: Current Everyday Smoker -- 50 years    Types: Cigarettes  . Smokeless tobacco: Never Used   Comment: 1 PPWEEK  . Alcohol Use: No  .  Drug Use: No  . Sexually Active: Not on file   Other Topics Concern  . Not on file   Social History Narrative   Lives w/ wife    Family History  Problem Relation Age of Onset  . Hypertension    . Heart attack Mother 54  . Heart disease Mother   . Stroke Mother   . Hyperlipidemia Mother   . Hypertension Mother   . Colon cancer Neg Hx   . Prostate cancer Neg Hx   . COPD Father   . Peripheral vascular disease Father   . Diabetes Brother   . Heart disease Brother   . Hypertension Brother   . Heart attack Brother   . Diabetes Daughter     Current Outpatient Prescriptions on File Prior to Visit  Medication Sig Dispense Refill  . amantadine (SYMMETREL) 100 MG capsule Take 200 mg by mouth 3  (three) times daily. By urologist      . aspirin EC 81 MG tablet Take 1 tablet (81 mg total) by mouth daily.      . B-D ULTRAFINE III SHORT PEN 31G X 8 MM MISC       . diclofenac sodium (VOLTAREN) 1 % GEL Apply 1 application topically 4 (four) times daily as needed. For pain  3 Tube  11  . gabapentin (NEURONTIN) 300 MG capsule Take 1 capsule (300 mg total) by mouth 3 (three) times daily.  90 capsule  3  . HYDROcodone-acetaminophen (VICODIN) 5-500 MG per tablet Take 1 tablet by mouth every 6 (six) hours as needed for pain.  20 tablet  0  . insulin aspart protamine-insulin aspart (NOVOLOG 70/30) (70-30) 100 UNIT/ML injection Inject 10-30 Units into the skin 2 (two) times daily. 30 units at breakfast and 10 units at 10pm      . meloxicam (MOBIC) 15 MG tablet Take 1 tablet (15 mg total) by mouth daily.  30 tablet  1  . metoprolol succinate (TOPROL XL) 25 MG 24 hr tablet Take 1 tablet (25 mg total) by mouth daily.  30 tablet  11  . Oxycodone HCl 10 MG TABS 3 (three) times daily.       . simvastatin (ZOCOR) 40 MG tablet Take 1 tablet (40 mg total) by mouth at bedtime.  90 tablet  1    Allergies  Allergen Reactions  . Bee Venom Anaphylaxis    Review of Systems (Positive items checked otherwise negative)  General: [ ]  Weight loss, [ ]  Weight gain, [ ]   Loss of appetite, [ ]  Fever  Neurologic: [ ]  Dizziness, [ ]  Blackouts, [ ]  Headaches, [x]  Multiple sclerosis with resultant weakness  Ear/Nose/Throat: [ ]  Change in eyesight, [ ]  Change in hearing, [ ]  Nose bleeds, [ ]  Sore throat  Vascular: [x]  Pain in legs with walking, [x]  Pain in feet while lying flat, [ ]  Non-healing ulcer, Stroke, [ ]  "Mini stroke", [ ]  Slurred speech, [ ]  Temporary blindness, [ ]  Blood clot in vein, [ ]  Phlebitis  Pulmonary: [ ]  Home oxygen, [ ]  Productive cough, [ ]  Bronchitis, [ ]  Coughing up blood, [ ]  Asthma, [ ]  Wheezing  Musculoskeletal: [ ]  Arthritis, [ ]  Joint pain, [ ]  Muscle pain  Cardiac: [ ]  Chest pain, [ ]   Chest tightness/pressure, [ ]  Shortness of breath when lying flat, [ ]  Shortness of breath with exertion, [ ]  Palpitations, [ ]  Heart murmur, [ ]  Arrythmia, [ ]  Atrial fibrillation  Hematologic: [ ]  Bleeding problems, [ ]   Clotting disorder, [ ]  Anemia  Psychiatric:  [ ]  Depression, [ ]  Anxiety, [ ]  Attention deficit disorder  Gastrointestinal:  [ ]  Black stool,[ ]   Blood in stool, [ ]  Peptic ulcer disease, [ ]  Reflux, [ ]  Hiatal hernia, [ ]  Trouble swallowing, [ ]  Diarrhea, [ ]  Constipation  Urinary:  [ ]  Kidney disease, [ ]  Burning with urination, [ ]  Frequent urination, [ ]  Difficulty urinating  Skin: [ ]  Ulcers, [x]  L foot gangrene  Physical Examination  Filed Vitals:   03/06/12 1012  BP: 118/81  Pulse: 74  Resp: 20  Height: 5\' 11"  (1.803 m)  Weight: 144 lb (65.318 kg)   Body mass index is 20.08 kg/(m^2).   General: A&O x 3, WDWN, wheelchair bound  Eyes: PERRLA, EOMI  Pulmonary: Sym exp, good air movt, CTAB, no rales, rhonchi, & wheezing  Cardiac: RRR, Nl S1, S2, no Murmurs, rubs or gallops  Vascular: Vessel Right Left  Radial Palpable Palpable  Ulnar Palpable Palpable  Brachial Palpable Palpable  Carotid Palpable, without bruit Palpable, without bruit  Aorta Not palpable N/A  Femoral Palpable Palpable  Popliteal Not palpable Not palpable  PT not Palpable not Palpable  DP not Palpable not Palpable   Gastrointestinal: soft, NTND, -G/R, - HSM, - masses, - CVAT B  Musculoskeletal: RUE 3-4/5, LUE 4/5, RLE 2-3/5, LLE 3-4/5 , Extremities without ischemic changes , Both feet appear viable, Both leg cool from distal thigh down , L 1st-3rd toe gangrene, without frank drainage today  Neurologic: CN 2-12 intact , Pain and light touch intact in extremities , Motor exam as listed above  Medical Decision Making  Jaime [name] is a 71 y.o. 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   Based on the patient's vascular studies and examination, I have  offered the patient: bypass with postop likely TMA vs L AKA.  He refuses to proceed with L AKA, so I have scheduled him for : L CFA to TPT bypass likely with Propaten +/- vein cuff, likely BK pop endarterectomy with patch angioplasty on 21 AUG 13. The risk, benefits, and alternative for bypass operations were discussed with the patient.  The patient is aware the risks include but are not limited to: bleeding, infection, myocardial infarction, stroke, limb loss, nerve damage, need for additional procedures in the future, wound complications, and inability to complete the bypass.   I explicitly discussed with the patient and family risk of death due to myocardial infarction and prolonged ventilator time due to his MS. The patient is aware of these risks and agreed to proceed.  I discussed in depth with the patient the nature of atherosclerosis, and emphasized the importance of maximal medical management including strict control of blood pressure, blood glucose, and lipid levels, antiplatelet agents, obtaining regular exercise, and cessation of smoking.    The patient is aware that without maximal medical management the underlying atherosclerotic disease process will progress, limiting the benefit of any interventions.  Thank you for allowing [place] to participate in this patient's care.  Leonides Sake, MD [facility] [place] Office: [phone] Pager: [phone]  03/06/2012, 4:04 PM

## 2012-03-11 NOTE — Progress Notes (Signed)
Dr Imogene Burn called regarding pulses in left foot. No pulses per Dr. Madelyn Flavors is cold to touch

## 2012-03-11 NOTE — Anesthesia Preprocedure Evaluation (Addendum)
Anesthesia Evaluation  Patient identified by MRN, date of birth, ID band Patient awake    Reviewed: Allergy & Precautions, H&P , NPO status , Patient's Chart, lab work & pertinent test results, reviewed documented beta blocker date and time   Airway       Dental   Pulmonary pneumonia -, former smoker,          Cardiovascular hypertension, Pt. on medications + CAD, + Past MI, + Cardiac Stents and +CHF     Neuro/Psych PSYCHIATRIC DISORDERS Anxiety CVA    GI/Hepatic   Endo/Other  Well Controlled, Type 2, Insulin Dependent  Renal/GU      Musculoskeletal   Abdominal   Peds  Hematology   Anesthesia Other Findings   Reproductive/Obstetrics                          Anesthesia Physical Anesthesia Plan  ASA: III and Emergent  Anesthesia Plan: General   Post-op Pain Management:    Induction: Intravenous  Airway Management Planned: Oral ETT  Additional Equipment:   Intra-op Plan:   Post-operative Plan: Extubation in OR  Informed Consent: I have reviewed the patients History and Physical, chart, labs and discussed the procedure including the risks, benefits and alternatives for the proposed anesthesia with the patient or authorized representative who has indicated his/her understanding and acceptance.   Dental advisory given  Plan Discussed with: Anesthesiologist and Surgeon  Anesthesia Plan Comments:         Anesthesia Quick Evaluation

## 2012-03-11 NOTE — Interval H&P Note (Signed)
[facility] [place]  History and Physical Update  The patient was interviewed and re-examined.  The patient's previous History and Physical has been reviewed and is unchanged. There is no change in the plan of care: L fem-TPT bypass, possible BK pop patch angioplasty.  Leonides Sake, MD [facility] [place] Office: [phone] Pager: [phone]  03/11/2012, 7:26 AM

## 2012-03-11 NOTE — Preoperative (Signed)
Beta Blockers   Reason not to administer Beta Blockers:Not Applicable. Lopressor @ 0530 03/11/12

## 2012-03-11 NOTE — Op Note (Addendum)
OPERATIVE NOTE   PROCEDURE: 1.  Left tibioperoneal trunk endarterectomy and patch angioplasty with vein  2.  Left common femoral artery to tibioperoneal trunk bypass  3.  Open cannulation of left tibioperoneal trunk  4.  Left leg angiogram  PRE-OPERATIVE DIAGNOSIS: Left foot dry gangrene  POST-OPERATIVE DIAGNOSIS: same as above   SURGEON: Leonides Sake, MD  ASSISTANT(S): Della Goo, [facility]  ANESTHESIA: general  ESTIMATED BLOOD LOSS: 400 cc  FINDING(S): 1.  Severely calcified and chronically occluded tibioperoneal trunk  2.  Patent but severely calcified posterior tibial artery: dominant runoff to left foot 3.  Severely diseased peroneal artery that occludes in the mid-calf 4.  Improved posterior tibial artery and peroneal artery flow signatures after bypass  SPECIMEN(S):  none  INDICATIONS:   Jaime [name] is a 71 y.o. male who presents with left foot dry gangrene.  He underwent angiography which demonstrated a tibioperoneal trunk that was still patent distally.  This patient has significant cardiac disease and multiple sclerosis, so I felt he was a suboptimal bypass candidate.  However, he was reluctant to proceed with amputation.  Subsequently, I offered him a left tibioperoneal trunk endarterectomy and left femoral to tibioperoneal trunk bypass with Propaten as his vein was inadequate.  The risk, benefits, and alternative for bypass operations were discussed with the patient.  The patient is aware the risks include but are not limited to: bleeding, infection, myocardial infarction, stroke, limb loss, nerve damage, need for additional procedures in the future, wound complications, and inability to complete the bypass.  The patient is aware of these risks and agreed to proceed.  DESCRIPTION: After full informed written consent was obtained, the patient was brought back to the operating room and placed supine upon the operating table.  Prior to induction, the patient was given  intravenous antibiotics.  After obtaining adequate anesthesia, the patient was prepped and draped in the standard fashion for a femoral to popliteal bypass operation.  I first turned my attention to the left calf.  I made a longitudinal incision about one finger width posterior to the tibia.  I then dissected through the subcutaneous tissue and fascia and retract the medial gastrocnemius belly posteriorly.  I dissected out the popliteal artery and popliteal artery.  I clearly identified the takeoff of the tibioperoneal trunk and anterior tibial artery.  I then tooked down the soleal muscle attachments with electrocautery, revealing the posterior tibial vein and tibioperoneal trunk.  Unfortunately the posterior tibial vein densely adherent to the adjacent arterial segmen, obscuring my visualization of the tibioperoneal trunk and the bifurcation into peroneal artery and posterior tibial artery.  Subsequently, I ligated a segment of the posterior tibial vein and transected it to obtain visualization of the target segment of artery.  The tibioperoneal trunk was severely calcified and was not compressible with a hemostat.  The disease extended into distal tibioperoneal trunk but both peroneal  And posterior tibial arteries proximally felt pliable.  The patient was given 6000 units of Heparin intravenously, which was a therapeutic bolus.  In total, 16109 units of Heparin was administrated to achieve and maintain a therapeutic level of anticoagulation, giving another 2000 units every subsequent hour.  After waiting 3 minutes, I clamped the distal popliteal artery and the peroneal and posterior tibial arteries.  I opened the tibioperoneal trunk with a 11-blade with great difficulty, basically cutting down to a calcific core in the lumen of this artery.  I then removed a portion of the calcific plaque and extended  the arteriotomy proximally and distally.   I endarterectomized this artery up to the origin of the tibioperoneal  trunk and down to the bifurcation of the tibioperoneal trunk.  I tested the clamped arteries by sequentially releasing them.  There was some retrograde flow from the two distal arteries and also some antegrade flow from the below-the-knee popliteal artery.  Subsequently, I felt it was likely going to be possible to do the bypass.  I spatulated the posterior vein I previously harvested and cut off a segment with sclerotic valves.  The vein was sewn to the tibioperoneal trunk with two running stitches of 6-0 Prolene from each end, tying in the middle.  Prior to completing this, I probed the peroneal and posterior tibial arteries with a 2 mm dilator.  Neither allowed the dilator to pass.  I backbled the tibioperoneal trunk and then completed the patch angioplasty in the usual fashion.  I punctured the vein patch with a micropuncture needle and passed the microwire into the distal popliteal artery.  The needle was exchanged for the microsheath.  The dilator and wire were removed and the sheath was connected to the injection tubing.  I did two hand injections to image from the left popliteal artery down to the foot.  This demonstrates dominant runoff in the posterior tibial artery and continued patency of peroneal and posterior tibial artery despite the calcific disease present proximally.  Based on the small, < 2 mm, size of the posterior tibial and severe calcific, I did not think it would survive an endarterectomy.  My plan is to completed the common femoral to tibioperoneal trunk bypass, and come back in 3 months to address the proximal posterior tibial artery with an orbital atherectomy with angioplasty.    At this point, I turned my attention left groin.  A longitudinal incision was made over the left common femoral artery.  Using blunt dissection and electrocautery, the artery was dissected out from the inguinal ligament to 5 cm distally on the common femoral artery.  This artery was also heavily diseased, but I  felt I had a soft area anterior to completed the bypass.  I obtained a long Gore tunneler and dissected a tunnel between the femoral condyles and passed the dissector in a subsartorial route from the calf incision up to the groin incision.  I tied a 6 mm Propaten graft with external rings to the inner cannula and delivered it through the tunnel, maintaining orientation in this process.  I removed the metal tunnel over the cannula and sharply released the graft.  I  readjusted the graft to maximize the segment of ringed graft.  I clamped the common femoral artery proximally and distally.  I made an arteriotomy with a 11-blade and extended it with a Potts scissor.  This was difficult due to some adjacent anterior wall calcification.  I spatulated this proximal graft to meet the dimensions of this arteriotomy.  I sewed the graft to the artery with CV-5 in a running fashion.  I took special care to take big bites to tack down some the atherosclerotic disease in the common femoral artery.  At this point, I placed thrombin and gelfoam around this anastomosis and clamped the graft just distal to the anastomosis after bleeding out the graft.  There was excellent antegrade blood flow to the distal end.  I then washed out the graft and loaded it with heparinized saline.  I then clamped the patched tibioperoneal trunk proximally and distally and made a  venotomy in the patch, extending it with Pott's scissors.  I spatulated the graft to the dimensions of this venotomy.  I took of ring from the graft to facilitate this process.  I also pulled the graft to appropriate tension, taking into account straighening of the left leg.  I sewed the distal graft to the patched tibioperoneal trunk in an end to side configuration with a running stitch of 6-0 Prolene.  Prior to completing this anastomosis, I backbled the peroneal and posterior tibial arteries and there was reasonable backbleeding.  I also allowed the below the knee popliteal  artery to bleed and there was some small trickle of bleeding.  I also allowed the graft to bleed, and there was excellent antegrade bleeding.  I completed this anastomosis in the usual fashion and placed thrombin and gelfoam around this anastomosis.  Distally I checked for a pulse and there was a strong posterior tibial artery pulse.  I could also faintly feel a posterior tibial artery pulse at the level of the ankle.  I checked for doppler signals and there were strong signals in the posterior tibial artery and peroneal artery, which disappeared with compression of the graft.  At this point, I washed out all surgical wounds and gave 50 mg of Protamine to reverse anti-coagulation.  I placed thrombin and gelfoam in all incisions.  After waiting 5 minutes, I washed out all wounds and no further bleeding was noted in the groin.  I closed the groin in two layers of 2-0 Vicryl, two layers of 3-0 Vicryl, and one layer of 4-0 Monocryl in a running subcuticular fashion.  The popliteal exposure was bleeding from the distal anastomosis, so I had to reinforce it with a few 6-0 Prolene stitches.  After applying another round of thrombin and gelfoam, the active bleeding ceased.  I reapproximated the subcutaneous tissue with a running stitch of 3-0 Vicryl.  I then closed the skin with a running subcuticular of 4-0 Monocryl.  The skin at both incisions was cleaned, dried, and reinforced with Dermabond.  The left foot was bandaged with sterile dressings.  COMPLICATIONS: none  CONDITION: stable   Nilda Simmer, MD 03/11/2012 2:42 PM

## 2012-03-11 NOTE — Progress Notes (Signed)
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 Bee Venom Anaphylaxis    Patient Measurements:   Adjusted Body Weight: 67.6 kg  Vital Signs: Temp: 98 F (36.7 C) (08/21 2025) BP: 107/66 mmHg (08/21 2145) Pulse Rate: 77  (08/21 2145) Intake/Output from previous day:   Intake/Output from this shift: Total I/O In: 500 [I.V.:500] Out: 400 [Urine:400]  Labs:  [facility] 03/11/12 2059 03/11/12 1132 03/10/12 1055  WBC 10.9* -- 8.5  HGB 10.4* 11.9* 13.3  PLT 208 -- 298  LABCREA -- -- --  CREATININE -- -- 0.77   The CrCl is unknown because both a height and weight (above a minimum accepted value) are required for this calculation. No results found for this basename: VANCOTROUGH:2,VANCOPEAK:2,VANCORANDOM:2,GENTTROUGH:2,GENTPEAK:2,GENTRANDOM:2,TOBRATROUGH:2,TOBRAPEAK:2,TOBRARND:2,AMIKACINPEAK:2,AMIKACINTROU:2,AMIKACIN:2, in the last 72 hours   Microbiology: Recent Results (from the past 720 hour(s))  SURGICAL PCR SCREEN     Status: Normal   Collection Time   03/10/12 10:54 AM      Component Value Range Status Comment   MRSA, PCR NEGATIVE  NEGATIVE Final    Staphylococcus aureus NEGATIVE  NEGATIVE Final     Medical History: Past Medical History  Diagnosis Date  . Hyperlipidemia   . Cardiomyopathy, ischemic     EF 45% per ECHO 2008  . Pulmonary hypertension     moderate ECHO Jan 2008  . CHF (congestive heart failure)   . Systolic heart failure   . Multiple sclerosis DX 1993    NEUROLOGIST-  DR DOHMEIER -- LAST VISIT 11-20-2010  NOTE W/ CHART  . Lung nodule     resolved 11-2006 CT Chest  . Tobacco abuse   . Increased prostate specific antigen (PSA) velocity   . Urinary retention     dx ~ 2-12, like from MS, now with a catheter, saw urology  . Ejection fraction      EF 45%, echo, 2008  . Cellulitis   . Pre-syncope     Near syncope, ER  visit, October, 2011  . Carotid artery disease     Doppler, February, 2012, 0-39% bilateral,Mild smooth plaque  . Chronic indwelling foley catheter   . Scoliosis associated with other condition   . Hemiparesis   . Fatigue SEVERE  . Weakness   . Impotence   . MI, acute, non ST segment elevation 2007    S/P PCI WITH X1 STENT (TAXUS DRUG-ELUTING) LEFT CIRCUMFLEX  . Echocardiogram abnormal 08-17-2006    SEVERE HYPOKINESIS OF INFERIOR , POSTEROR WALLS OF BASE AND MID VENTRICLE AND MOD. PULMONARY HTN  . Diabetes mellitus     INSULIN-DEPENDANT  . Hypertension   . H/O pleural effusion 2008    POST THORACENTESIS  . History of colon polyps PRECANCEROUS  . Insomnia   . Ingrown right big toenail CHRONIC    PER PT VERY SORE  . CAD (coronary artery disease) CARDIOLOGIST- DR KATZ-- VISIT 06-05-2011 IN [facility]    non-STEMI, 2007.Marland Kitchenoccluded circumflex.. Taxus stent placed...residual 80% LAD...50% RCA  . Pneumonia     hx of  . Urinary tract infection     hx of  . Neuromuscular disorder     multiple sclerosis  . Atherosclerotic PVD with ulceration     left foot    Medications:  Scheduled:    . amantadine  200 mg Oral TID  . aspirin EC  81 mg Oral Daily  . cefUROXime (ZINACEF)  IV  1.5  g Intravenous 30 min Pre-Op  . docusate sodium  100 mg Oral Daily  . enoxaparin (LOVENOX) injection  40 mg Subcutaneous Q24H  . gabapentin  300 mg Oral TID  . HYDROmorphone      . HYDROmorphone      . insulin aspart  0-15 Units Subcutaneous TID WC  . insulin aspart protamine-insulin aspart  10-30 Units Subcutaneous BID  . metoprolol succinate  25 mg Oral Daily  . pantoprazole  40 mg Oral Q1200  . simvastatin  40 mg Oral QHS   Infusions:    . sodium chloride    . DOPamine    . 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  Patient got a dose of vancomycin 1g iv at 1636 today.  SCr on  03/10/12 was 0.77 (CrCl ~66 with a fudge SCr of 1 based on advanced age).  Goal of Therapy:  Vancomycin trough level 15-20 mcg/ml  Plan:  1) Zosyn 3.375g iv q8h (4hr infusion) and vancomycin 750mg  iv q12h (1st dose at 0600 on 03/12/12) 2) Monitor renal fxn and check vancomycin trough when it's appropriate. 3) F/u on cx and sensitivity if any  Jaime [name], Tsz-Yin 03/11/2012,10:10 PM

## 2012-03-11 NOTE — Interval H&P Note (Signed)
[facility] [place]  History and Physical Update  The patient was interviewed and re-examined.  The patient's previous History and Physical has been reviewed and is unchanged except for: interval left tibioperoneal trunk endarterectomy with patch angioplasty and left common femoral artery to tibioperoneal trunk bypass.  The patient acutely had loss of his posterior tibial artery signal in the PACUl, so I discussed with the patient left leg exploration, possible thrombectomy, possible angiogram, and possible revascularization.  The risk, benefits, and alternative for bypass operations were discussed with the patient.  The patient is aware the risks include but are not limited to: bleeding, infection, myocardial infarction, stroke, limb loss, nerve damage, need for additional procedures in the future, wound complications, and inability to complete the bypass.  The patient is aware of these risks and agreed to proceed.   Leonides Sake, MD [facility] [place] Office: [phone] Pager: [phone]  03/11/2012, 4:30 PM

## 2012-03-11 NOTE — Progress Notes (Signed)
Back to OR.

## 2012-03-12 ENCOUNTER — Encounter (HOSPITAL_COMMUNITY): Payer: Self-pay | Admitting: *Deleted

## 2012-03-12 LAB — CBC
Platelets: 258 10*3/uL (ref 150–400)
RBC: 4.05 MIL/uL — ABNORMAL LOW (ref 4.22–5.81)
WBC: 10.4 10*3/uL (ref 4.0–10.5)

## 2012-03-12 LAB — GLUCOSE, CAPILLARY
Glucose-Capillary: 106 mg/dL — ABNORMAL HIGH (ref 70–99)
Glucose-Capillary: 106 mg/dL — ABNORMAL HIGH (ref 70–99)
Glucose-Capillary: 127 mg/dL — ABNORMAL HIGH (ref 70–99)
Glucose-Capillary: 151 mg/dL — ABNORMAL HIGH (ref 70–99)

## 2012-03-12 LAB — BASIC METABOLIC PANEL
Calcium: 8.4 mg/dL (ref 8.4–10.5)
GFR calc Af Amer: >90 mL/min (ref >90)
GFR calc non Af Amer: 83 mL/min — ABNORMAL LOW (ref >90)
Sodium: 141 mEq/L (ref 135–145)

## 2012-03-12 LAB — MRSA PCR SCREENING: MRSA by PCR: NEGATIVE

## 2012-03-12 LAB — HEMOGLOBIN A1C: Mean Plasma Glucose: 160 mg/dL — ABNORMAL HIGH (ref <117)

## 2012-03-12 NOTE — Progress Notes (Signed)
OT Note:  Order received, chart reviewed.  Pt was dependent in mobility and most ADL prior to admission.  No acute OT needs identified.  Did not proceed with eval.  Signing off. 03/12/2012 Martie Round, OTR/L Pager: [phone]

## 2012-03-12 NOTE — Progress Notes (Signed)
MD Hart [place] notified at this time. Patient has no  RLE popliteal, RLE tibal, and RLE pedal pulse. Only R femoral pulse present. Patients leg is cold to touch. The patient at this time does not report any numbness or pain. No new orders obtained at this time. Will continue to monitor the patient.

## 2012-03-12 NOTE — Progress Notes (Signed)
Report called to RN and met at bedside. Patient attached to telemetry monitor without complication. Transferred to room 3304.

## 2012-03-12 NOTE — Evaluation (Signed)
Physical Therapy Evaluation Patient Details Name: Jaime [name] MRN: [id number] DOB: July 12, 1941 Today's Date: 03/12/2012 Time: 1191-4782 PT Time Calculation (min): 35 min  PT Assessment / Plan / Recommendation Clinical Impression  pt presents with L Fem Post Tib BPG.  pt with very low activity level PTA and required A for all aspects of care.  Per wife they are starting to struggle at home and wife would like HHAide and Mechanical lift for D/C to home.  pt would benefit from SNF placement, but ? if pt and wife would agree.      PT Assessment  Patient needs continued PT services    Follow Up Recommendations  Supervision/Assistance - 24 hour (HHAide)    Barriers to Discharge Decreased caregiver support Wife indicating caring for pt has become difficult and would like an Aide at home to help care for pt.      Equipment Recommendations   Financial trader)    Recommendations for Other Services     Frequency Min 3X/week    Precautions / Restrictions Precautions Precautions: Fall Restrictions Weight Bearing Restrictions: No   Pertinent Vitals/Pain Denies pain.        Mobility  Bed Mobility Bed Mobility: Supine to Sit;Sitting - Scoot to Edge of Bed;Sit to Supine Supine to Sit: 1: +2 Total assist Supine to Sit: Patient Percentage: 20% Sitting - Scoot to Edge of Bed: 1: +2 Total assist Sitting - Scoot to Edge of Bed: Patient Percentage: 0% Sit to Supine: 1: +2 Total assist Sit to Supine: Patient Percentage: 10% Details for Bed Mobility Assistance: Max cueing for safe technique and safety.  pt impulsive and unaware of need for A.  pt easily distracted and poor awareness of safety or of body position in space.   Transfers Transfers: Not assessed Ambulation/Gait Ambulation/Gait Assistance: Not tested (comment) Stairs: No Wheelchair Mobility Wheelchair Mobility: No    Exercises     PT Diagnosis: Generalized weakness  PT Problem List: Decreased strength;Decreased activity  tolerance;Decreased balance;Decreased mobility;Decreased cognition;Decreased knowledge of use of DME;Decreased safety awareness;Impaired sensation PT Treatment Interventions: DME instruction;Functional mobility training;Therapeutic activities;Therapeutic exercise;Balance training;Cognitive remediation;Patient/family education   PT Goals Acute Rehab PT Goals PT Goal Formulation: With patient Time For Goal Achievement: 03/26/12 Potential to Achieve Goals: Fair Pt will go Supine/Side to Sit: with min assist PT Goal: Supine/Side to Sit - Progress: Goal set today Pt will go Sit to Supine/Side: with min assist PT Goal: Sit to Supine/Side - Progress: Goal set today Pt will Transfer Bed to Chair/Chair to Bed: with min assist PT Transfer Goal: Bed to Chair/Chair to Bed - Progress: Goal set today Additional Goals Additional Goal #1: pt and wife will demo ability to use lift with ModIndependence.   PT Goal: Additional Goal #1 - Progress: Goal set today  Visit Information  Last PT Received On: 03/12/12 Assistance Needed: +2    Subjective Data  Subjective: Oh, I can get up by myself.   Patient Stated Goal: None Stated.     Prior Functioning  Home Living Lives With: Spouse Available Help at Discharge: Family;Available 24 hours/day Type of Home: House Home Access: Ramped entrance Home Layout: One level Home Adaptive Equipment: Walker - rolling;Straight cane;Wheelchair - powered Additional Comments: pt with inconsistent responses at one point stating he stands and another time stating he can't.  Found wife in waiting room who notes pt has been unable to [facility] for years.  pt's Home situation and PLOF entered into epic are per wife as pt with cognitive  deficits.   Prior Function Level of Independence: Needs assistance Needs Assistance: Bathing;Dressing;Feeding;Grooming;Toileting;Meal Prep;Light Housekeeping Bath: Maximal Dressing: Maximal Feeding: Minimal Grooming: Minimal Toileting: Total Meal  Prep: Total Light Housekeeping: Total Able to Take Stairs?: No Driving: No Vocation: On disability Comments: Per wife pt would need A to slide transfer from bed to power chair.  Wife bathed pt in bed, pt incontinent of bowel and bladder.  Wife notes that her and pt have been struggling at home and we discussed HHAide and Mechanical lift.   Communication Communication: No difficulties    Cognition  Overall Cognitive Status: History of cognitive impairments - further impaired Area of Impairment: Attention;Memory;Safety/judgement;Awareness of errors;Awareness of deficits;Problem solving;Executive functioning Arousal/Alertness: Awake/alert Orientation Level: Disoriented to;Time;Situation Behavior During Session: [facility] for tasks performed Current Attention Level: Sustained Memory Deficits: pt with decreased recal of home set-up and PLOF.   Safety/Judgement: Decreased safety judgement for tasks assessed;Impulsive;Decreased awareness of need for assistance Awareness of Errors: Assistance required to identify errors made;Assistance required to correct errors made Awareness of Deficits: pt impulsive and thinks he is able to do more than he really can.   Cognition - Other Comments: Per wife pt tries to get himself into power chair on his own and often needs help to prevent falling  or at times has fallen and needs A back to bed.      Extremity/Trunk Assessment Right Lower Extremity Assessment RLE ROM/Strength/Tone: Deficits RLE ROM/Strength/Tone Deficits: PROM WFL, only activie motion is hip hike RLE Sensation: Deficits RLE Sensation Deficits: pt with no soft touch sensation in R LE Left Lower Extremity Assessment LLE ROM/Strength/Tone: Deficits LLE ROM/Strength/Tone Deficits: PROM WFL, only active motion is hip hike LLE Sensation: Deficits LLE Sensation Deficits: pt with absent soft touch.   Trunk Assessment Trunk Assessment:  (Very scoliotic)   Balance Balance Balance Assessed: Yes Static  Sitting Balance Static Sitting - Balance Support: Bilateral upper extremity supported;Feet supported Static Sitting - Level of Assistance: 2: Max assist Static Sitting - Comment/# of Minutes: pt with poor awareness of body position and center of balance.  pt with no trunk control and poor safety awareness.    End of Session PT - End of Session Activity Tolerance: Patient tolerated treatment well Patient left: in bed;with call bell/phone within reach;with bed alarm set Nurse Communication: Mobility status;Need for lift equipment  GP     Sunny Schlein, [employer] [phone] 03/12/2012, 12:27 PM

## 2012-03-12 NOTE — Progress Notes (Signed)
Patient received from 2300 via bed. Patient alert and oriented to self,place. Pt. Settled and oriented to the unit and room. During assessment  Observed the right foot to be cold to touch but patient stated he could feel me touch him. The left foot is cool to touch . Denies pain at present.

## 2012-03-12 NOTE — Progress Notes (Addendum)
[facility] [place]  Daily Progress Note  Assessment/Planning: POD #1 s/p L tibioperoneal trunk endarterectomy and bovine patch angioplasty, L fem-TPT bypass with propaten   The bypass remains open as evident by warmth down to the calf, which was not present previously.  However, it appears to be no perfusion of the left foot by continuous doppler ultrasound, despite two intraop angiogram demonstrating perfusion to the left foot.  I am not too surprised given the high resistive character to his post-op doppler exam, reflecting his underlying tibial disease.    This is the second failed procedure on this patient and he does not have an adequate vein conduit for a fem-distal bypass.  Subsequently, I recommend proceeding with my prior recommendation: L AKA.  At some pt, his right leg may need a R AKA also, but previously he was not symptomatic from that leg.  This patient has expressed a desire for NO AMPUTATION in the past and his mental status is not at baseline yet, so no decision will be made yet in this regards.  Subjective  - 1 Day Post-Op  Confused  Objective Filed Vitals:   03/12/12 0500 03/12/12 0600 03/12/12 0700 03/12/12 0722  BP: 141/75 141/75 123/65   Pulse: 85 86 67   Temp:    99.8 F (37.7 C)  TempSrc:    Oral  Resp: 14 15 32   Height:      Weight:      SpO2: 100% 100% 100%     Intake/Output Summary (Last 24 hours) at 03/12/12 0741 Last data filed at 03/12/12 0701  Gross per 24 hour  Intake 5612.5 ml  Output   2290 ml  Net 3322.5 ml   GEN somewhat confused, restrained PULM  BLL rales CV  RRR GI  soft, NTND VASC  B femoral palpable, R calf and foot cool, L calf warm, L foot cool, no signals in either foot  Laboratory CBC    Component Value Date/Time   WBC 10.4 03/12/2012 0335   HGB 10.6* 03/12/2012 0335   HCT 32.8* 03/12/2012 0335   PLT 258 03/12/2012 0335    BMET    Component Value Date/Time   NA 141 03/12/2012 0335   K 4.6  03/12/2012 0335   CL 107 03/12/2012 0335   CO2 24 03/12/2012 0335   GLUCOSE 154* 03/12/2012 0335   GLUCOSE 110* 05/12/2006 1139   BUN 11 03/12/2012 0335   CREATININE 0.92 03/12/2012 0335   CALCIUM 8.4 03/12/2012 0335   GFRNONAA 83* 03/12/2012 0335   GFRAA >90 03/12/2012 0335    Leonides Sake, MD [facility] [place] Office: [phone] Pager: [phone]  03/12/2012, 7:41 AM  Addendum  Clinically, pt without sx c/w UTI.  Would just repeat the UA.  The abx are for his left foot gangrene.  Leonides Sake, MD [facility] [place] Office: [phone] Pager: [phone]  03/12/2012, 6:16 PM

## 2012-03-12 NOTE — Progress Notes (Signed)
Utilization review completed.  

## 2012-03-12 NOTE — Clinical Documentation Improvement (Signed)
GENERIC DOCUMENTATION CLARIFICATION QUERY  THIS DOCUMENT IS NOT A PERMANENT PART OF THE MEDICAL RECORD  TO RESPOND TO THE THIS QUERY, FOLLOW THE INSTRUCTIONS BELOW:  1. If needed, update documentation for the patient's encounter via the notes activity.  2. Access this query again and click edit on the In [employer].  3. After updating, or not, click F2 to complete all highlighted (required) fields concerning your review. Select "additional documentation in the medical record" OR "no additional documentation provided".  4. Click Sign note button.  5. The deficiency will fall out of your In Basket *Please let us know if you are not able to complete this workflow by phone or e-mail (listed below).  Please update your documentation within the medical record to reflect your response to this query.                                                                                        03/12/12   Dear Dr.Chen / Associates,  In a better effort to capture your patient's severity of illness, reflect appropriate length of stay and utilization of resources, a review of the patient medical record has revealed the following indicators.    Based on your clinical judgment, please clarify and document in a progress note and/or discharge summary the clinical condition associated with the following supporting information:  In responding to this query please exercise your independent judgment.  The fact that a query is asked, does not imply that any particular answer is desired or expected.   Possible Clinical Conditions?  _______UTI   _______Other Condition  _______Cannot Clinically Determine     Diagnostics: 8/21: urine micro: Few squamous epithelial., few bacteria, Ca. Oxalate crystals. 8/21: urinalysis: Color:amber. Appearance:cloudy. Glucose, U/A:100. Bilirubin: small. Leukocytes: moderate.  Treatment: Currently receiving Piperacillin(Zosyn) 3.375gm IV q8hr and Vancomycin 750mg  IV q12hr  for Left foot gangrene.  You may use possible, probable, or suspect with inpatient documentation. possible, probable, suspected diagnoses MUST be documented at the time of discharge  Reviewed: additional documentation in the medical record  Thank You,  Marciano Sequin,  Clinical Documentation Specialist:  Pager: [phone]  Health Information Management [place]

## 2012-03-13 ENCOUNTER — Encounter (HOSPITAL_COMMUNITY): Payer: Self-pay

## 2012-03-13 LAB — BASIC METABOLIC PANEL
CO2: 23 mEq/L (ref 19–32)
Chloride: 108 mEq/L (ref 96–112)
Potassium: 3.7 mEq/L (ref 3.5–5.1)
Sodium: 140 mEq/L (ref 135–145)

## 2012-03-13 LAB — URINALYSIS, ROUTINE W REFLEX MICROSCOPIC
Glucose, UA: NEGATIVE mg/dL
Hgb urine dipstick: NEGATIVE
Protein, ur: 30 mg/dL — AB
Specific Gravity, Urine: 1.016 (ref 1.005–1.030)

## 2012-03-13 LAB — CBC
HCT: 29.7 % — ABNORMAL LOW (ref 39.0–52.0)
Hemoglobin: 9.5 g/dL — ABNORMAL LOW (ref 13.0–17.0)
MCV: 81.4 fL (ref 78.0–100.0)
Platelets: 208 10*3/uL (ref 150–400)
RBC: 3.65 MIL/uL — ABNORMAL LOW (ref 4.22–5.81)
WBC: 10.9 10*3/uL — ABNORMAL HIGH (ref 4.0–10.5)

## 2012-03-13 LAB — GLUCOSE, CAPILLARY
Glucose-Capillary: 125 mg/dL — ABNORMAL HIGH (ref 70–99)
Glucose-Capillary: 76 mg/dL (ref 70–99)
Glucose-Capillary: 106 mg/dL — ABNORMAL HIGH (ref 70–99)

## 2012-03-13 LAB — HEPATIC FUNCTION PANEL
ALT: 13 U/L (ref 0–53)
AST: 22 U/L (ref 0–37)
Albumin: 2.3 g/dL — ABNORMAL LOW (ref 3.5–5.2)
Total Bilirubin: 0.4 mg/dL (ref 0.3–1.2)

## 2012-03-13 LAB — CK TOTAL AND CKMB (NOT AT ARMC): Relative Index: 0.4 (ref 0.0–2.5)

## 2012-03-13 MED ORDER — SODIUM CHLORIDE 0.9 % IV SOLN
INTRAVENOUS | Status: DC | PRN
  Administered 2012-03-13: 16:00:00 via INTRAVENOUS

## 2012-03-13 MED ORDER — INSULIN ASPART PROT & ASPART (70-30 MIX) 100 UNIT/ML ~~LOC~~ SUSP
10.0000 [IU] | Freq: Every day | SUBCUTANEOUS | Status: DC
  Administered 2012-03-14 – 2012-03-15 (×2): 10 [IU] via SUBCUTANEOUS
  Filled 2012-03-13: qty 3

## 2012-03-13 NOTE — Progress Notes (Addendum)
[facility] [place]  Progress Note Bypass Surgery  Date of Surgery: 03/11/2012  Procedure(s): left BYPASS GRAFT FEMORAL-POPLITEAL ARTERY Surgeon: Surgeon(s): Fransisco Hertz, MD  2 Days Post-Op  History of Present Illness  Jaime [name] is a 71 y.o. male who is S/P Procedure(s): BYPASS GRAFT FEMORAL-POPLITEAL ARTERY left.  The patient's pre-op symptoms of pain are Unchanged . Patients pain is well controlled.  He has decided to have an amputation as the pain in his foot is constant but bearable.  Significant Diagnostic Studies: CBC Lab Results  Component Value Date   WBC 10.9* 03/13/2012   HGB 9.5* 03/13/2012   HCT 29.7* 03/13/2012   MCV 81.4 03/13/2012   PLT 208 03/13/2012    BMET     Component Value Date/Time   NA 140 03/13/2012 0545   K 3.7 03/13/2012 0545   CL 108 03/13/2012 0545   CO2 23 03/13/2012 0545   GLUCOSE 132* 03/13/2012 0545   GLUCOSE 110* 05/12/2006 1139   BUN 10 03/13/2012 0545   CREATININE 0.94 03/13/2012 0545   CALCIUM 8.2* 03/13/2012 0545   GFRNONAA 83* 03/13/2012 0545   GFRAA >90 03/13/2012 0545    COAG Lab Results  Component Value Date   INR 1.04 03/10/2012   INR 0.94 12/11/2011   INR 1.05 10/09/2010   No results found for this basename: PTT    Physical Examination  BP Readings from Last 3 Encounters:  03/13/12 136/75  03/13/12 136/75  03/13/12 136/75   Temp Readings from Last 3 Encounters:  03/13/12 99.4 F (37.4 C) Oral  03/13/12 99.4 F (37.4 C) Oral  03/13/12 99.4 F (37.4 C) Oral   SpO2 Readings from Last 3 Encounters:  03/13/12 97%  03/13/12 97%  03/13/12 97%   Pulse Readings from Last 3 Encounters:  03/13/12 87  03/13/12 87  03/13/12 87    Pt is A&O x 3 left lower extremity: Incision/s is/are clean,dry.intact, and  healing without hematoma, erythema or drainage Limb is warm;   Assessment/Plan: Pt. Doing well but pain in foot is constant Pt states he has decided to proceed with amputation Post-op  pain is controlled Wounds are healing well Continue wound care as ordered ? Transfer to floor  Jaime [name] [phone] 03/13/2012 7:23 AM  Addendum  I have independently interviewed and examined the patient, and I agree with the physician assistant's findings.  Dopplerable peroneal on exam today.  Calf remains warm.  Pain better.  D/w patient TMA vs AKA.  He is going to think over his wishes.  Procedure at earliest on Tuesday.  I have a suspicion that the TMA may be a reach, as the patient's post-operative pulse exam demonstrates severe tibial arterial disease.  I think a TMA may have limited durability.  No renal deterioration and fairly minimal CK leak.  D/C foley.  Ok to transfer to 2000.  Jaime Sake, MD [facility] [place] Office: [phone] Pager: [phone]  03/13/2012, 8:36 AM

## 2012-03-13 NOTE — Progress Notes (Signed)
Report given to charge nurse on 2000.  Pt transferred via bed.  No change from am assessment, NAD.

## 2012-03-13 NOTE — Progress Notes (Signed)
Foley catheter dc'ed per order after sending urine specimen to the lab.  Pt tolerated removal well.  Catheter tip intact.  Placed urinal at bedside and Instructed to use as needed.

## 2012-03-13 NOTE — Progress Notes (Signed)
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 with poor sitting balance and leg strength not enough to allow pt to transfer safely without mechanical lift    Follow Up Recommendations  Supervision/Assistance - 24 hour    Barriers to Discharge        Equipment Recommendations  Other (comment) (mechanical lift/pad)    Recommendations for Other Services    Frequency     Plan Discharge plan remains appropriate    Precautions / Restrictions Precautions Precautions: Fall Restrictions Weight Bearing Restrictions: No   Pertinent Vitals/Pain     Mobility  Bed Mobility Bed Mobility: Supine to Sit;Sitting - Scoot to Edge of Bed Supine to Sit: 1: +2 Total assist Supine to Sit: Patient Percentage: 20% Sitting - Scoot to Edge of Bed: 1: +2 Total assist Sitting - Scoot to Edge of Bed: Patient Percentage: 10% (mostly through control of trunk with Bil UE's) Details for Bed Mobility Assistance: pt does rush through the task impulsively, but still needs +2 tot assist for safety.  VC's for safe technique Transfers Transfers: Lobbyist Pivot Transfers: 1: +2 Total assist;From elevated surface Squat Pivot Transfers: Patient Percentage: 10% Details for Transfer Assistance: vc's for technique, how he could help with UE's  Full lifting support-- similar to quad lift. Ambulation/Gait Ambulation/Gait Assistance: Not tested (comment) (not applicable) Stairs: No    Exercises     PT Diagnosis:    PT Problem List:   PT Treatment Interventions:     PT Goals Acute Rehab PT Goals PT Goal Formulation: With patient PT Goal: Supine/Side to Sit - Progress: Progressing toward goal PT Transfer Goal: Bed to Chair/Chair to Bed - Progress:  Not progressing  Visit Information  Last PT Received On: 03/13/12 Assistance Needed: +2    Subjective Data  Subjective: I was floored that I couldn't get up to the EOB Patient Stated Goal: I want to get back home   Cognition  Overall Cognitive Status: History of cognitive impairments - further impaired Area of Impairment: Attention;Memory;Safety/judgement;Awareness of errors;Awareness of deficits;Problem solving;Executive functioning Arousal/Alertness: Awake/alert Behavior During Session: [facility] for tasks performed Current Attention Level: Sustained    Balance  Balance Balance Assessed: Yes Static Sitting Balance Static Sitting - Balance Support: Bilateral upper extremity supported;Feet supported Static Sitting - Level of Assistance: 2: Max assist Static Sitting - Comment/# of Minutes: pt with heavy list to the R.  Not liking to support himself with UE's  on the bed, but prefered to grab therapists belt  End of Session PT - End of Session Equipment Utilized During Treatment: Gait belt Activity Tolerance: Patient tolerated treatment well Patient left: in chair;with call bell/phone within reach Nurse Communication: Mobility status;Need for lift equipment   GP     Sunil Hue, Eliseo Gum 03/13/2012, 12:47 PM  03/13/2012  [place] Bing, PT [phone] (pager)

## 2012-03-14 LAB — GLUCOSE, CAPILLARY
Glucose-Capillary: 97 mg/dL (ref 70–99)
Glucose-Capillary: 73 mg/dL (ref 70–99)
Glucose-Capillary: 249 mg/dL — ABNORMAL HIGH (ref 70–99)

## 2012-03-14 NOTE — Progress Notes (Signed)
Subjective: Interval History: none..   Objective: Vital signs in last 24 hours: Temp:  [98.2 F (36.8 C)-99.1 F (37.3 C)] 99.1 F (37.3 C) (08/24 0500) Pulse Rate:  [83-97] 97  (08/24 0500) Resp:  [18-20] 18  (08/24 0500) BP: (132-151)/(71-98) 151/92 mmHg (08/24 0500) SpO2:  [94 %-98 %] 96 % (08/24 0500)  Intake/Output from previous day: 08/23 0701 - 08/24 0700 In: 1100 [P.O.:600; I.V.:450; IV Piggyback:50] Out: 850 [Urine:850] Intake/Output this shift: Total I/O In: 120 [P.O.:120] Out: -   Wds stable . 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  Hypertension.  CHEST - 2 VIEW  Comparison: 01/29/2011  Findings: Rightward scoliosis in the thoracic spine. Heart and mediastinal contours are within normal limits.  No focal opacities or effusions.  No acute bony abnormality.  IMPRESSION: No active cardiopulmonary disease.   Original Report Authenticated By: Cyndie Chime, M.D.    Nm Myocar Multi W/spect W/wall Motion / Ef  02/27/2012  *RADIOLOGY REPORT*  Clinical Data:  Surgical clearance.  Coronary artery disease.  MYOCARDIAL IMAGING WITH SPECT (REST AND PHARMACOLOGIC-STRESS) GATED LEFT VENTRICULAR WALL MOTION STUDY LEFT VENTRICULAR EJECTION FRACTION  Technique:  Standard myocardial SPECT imaging was performed after resting intravenous injection of 10 mCi Tc-57m tetrofosmin. Subsequently, intravenous infusion of Lexiscan was performed under the supervision of the Cardiology staff.  At peak effect of the drug, 30 mCi Tc-78m tetrofosmin was injected intravenously and standard myocardial SPECT  imaging was performed.  Quantitative  gated imaging was also performed to evaluate left ventricular wall motion, and estimate left ventricular ejection fraction.  Comparison:  None  Findings: SPECT imaging demonstrates large fixed defect in the inferolateral wall with a smaller defect in the anteroseptal wall. Findings compatible with old infarcts/scar.  No significant reversibility to suggest ischemia.  Quantitative gated analysis shows generalized hypokinesia with severe hypokinesia in the inferolateral wall.  The resting left ventricular ejection fraction is 29% with end- diastolic volume of 157 ml and end-systolic volume of 111 ml.  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  Smaller scar in the anteroseptal wall.  No evidence of ischemia.  Ejection fraction 29%.  Original Report Authenticated By: Cyndie Chime, M.D.   Anti-infectives: Anti-infectives     Start     Dose/Rate Route Frequency Ordered Stop   03/12/12 0600   vancomycin (VANCOCIN) 750 mg in sodium chloride 0.9 % 150 mL IVPB        750 mg 150 mL/hr over 60 Minutes Intravenous Every 12 hours 03/11/12 2215     03/11/12 2330   piperacillin-tazobactam (ZOSYN) IVPB 3.375 g        3.375 g 12.5 mL/hr over 240 Minutes Intravenous Every 8 hours 03/11/12 2215     03/10/12 1029   cefUROXime (ZINACEF) 1.5 g in dextrose 5 % 50 mL IVPB        1.5 g 100 mL/hr over 30 Minutes Intravenous 30 min pre-op 03/10/12 1029 03/11/12 0901          Assessment/Plan: s/p Procedure(s) (LRB): BYPASS  GRAFT FEMORAL-POPLITEAL ARTERY (Left) Pain well controlled. Watch foot resolution   LOS: 3 days   Jaime [name] 03/14/2012, 10:50 AM

## 2012-03-14 NOTE — Progress Notes (Signed)
ANTIBIOTIC CONSULT NOTE - FOLLOW UP  Pharmacy Consult for vancomycin and zosyn Indication: left foot gangrene   Allergies  Allergen Reactions  . Bee Venom Anaphylaxis    Patient Measurements: Height: 5\' 10"  (177.8 cm) Weight: 189 lb 13.1 oz (86.1 kg) (bed weight scale, one pillow) IBW/kg (Calculated) : 73    Vital Signs: Temp: 99.1 F (37.3 C) (08/24 0500) Temp src: Oral (08/24 0500) BP: 151/92 mmHg (08/24 0500) Pulse Rate: 97  (08/24 0500) Intake/Output from previous day: 08/23 0701 - 08/24 0700 In: 1100 [P.O.:600; I.V.:450; IV Piggyback:50] Out: 850 [Urine:850] Intake/Output from this shift: Total I/O In: 120 [P.O.:120] Out: -   Labs:  Basename 03/13/12 0545 03/12/12 0335 03/11/12 2059  WBC 10.9* 10.4 10.9*  HGB 9.5* 10.6* 10.4*  PLT 208 258 208  LABCREA -- -- --  CREATININE 0.94 0.92 --   Estimated Creatinine Clearance: 75.5 ml/min (by C-G formula based on Cr of 0.94). No results found for this basename: VANCOTROUGH:2,VANCOPEAK:2,VANCORANDOM:2,GENTTROUGH:2,GENTPEAK:2,GENTRANDOM:2,TOBRATROUGH:2,TOBRAPEAK:2,TOBRARND:2,AMIKACINPEAK:2,AMIKACINTROU:2,AMIKACIN:2, in the last 72 hours   Microbiology: Recent Results (from the past 720 hour(s))  SURGICAL PCR SCREEN     Status: Normal   Collection Time   03/10/12 10:54 AM      Component Value Range Status Comment   MRSA, PCR NEGATIVE  NEGATIVE Final    Staphylococcus aureus NEGATIVE  NEGATIVE Final   MRSA PCR SCREENING     Status: Normal   Collection Time   03/11/12 10:30 PM      Component Value Range Status Comment   MRSA by PCR NEGATIVE  NEGATIVE Final     Anti-infectives     Start     Dose/Rate Route Frequency Ordered Stop   03/12/12 0600   vancomycin (VANCOCIN) 750 mg in sodium chloride 0.9 % 150 mL IVPB        750 mg 150 mL/hr over 60 Minutes Intravenous Every 12 hours 03/11/12 2215     03/11/12 2330  piperacillin-tazobactam (ZOSYN) IVPB 3.375 g       3.375 g 12.5 mL/hr over 240 Minutes Intravenous  Every 8 hours 03/11/12 2215     03/10/12 1029   cefUROXime (ZINACEF) 1.5 g in dextrose 5 % 50 mL IVPB        1.5 g 100 mL/hr over 30 Minutes Intravenous 30 min pre-op 03/10/12 1029 03/11/12 0901          Assessment: Patient is a 71 y.o m on vancomycin and Zosyn day #4 for left foot gangrene with plan for possible TMA or AKA next week.  No cultures sent except for UCX on 8/23 which is NGTD.  Scr is stable.   Goal of Therapy:  Vancomycin trough level 10-15 mcg/ml  Plan:  1) no change for abx regimen 2) Please indicate LOT for vancomycin.  Pharmacy will plan on obtaining vancomycin level in the next 24-48 hours if to continue with abx  Amandeep Hogston P 03/14/2012,10:56 AM

## 2012-03-14 NOTE — Plan of Care (Signed)
Problem: Phase III Progression Outcomes Goal: Pain controlled on oral analgesia Outcome: Completed/Met Date Met:  03/14/12 Pain well controled with meds. Pain at patients tolerable level. Will continue to monitor and intervene if necessary.    Goal: Activity at appropriate level-compared to baseline (UP IN CHAIR FOR HEMODIALYSIS)  Outcome: Not Progressing Pt is bed ridden, turns q 2 hrs and as needed. Uses a bed lift at home. Overlay mattress ordered but not here yet. Goal: Voiding independently Outcome: Completed/Met Date Met:  03/14/12 Pt has a condom cath due to frequent urination. Will monitor for UTI. Pt currently receiving multiple ABx. Goal: IV changed to normal saline lock Outcome: Completed/Met Date Met:  03/14/12 Pt IV saline locked. Taking adequate PO fluid.

## 2012-03-15 LAB — URINE CULTURE: Culture: NO GROWTH

## 2012-03-15 LAB — GLUCOSE, CAPILLARY
Glucose-Capillary: 95 mg/dL (ref 70–99)
Glucose-Capillary: 115 mg/dL — ABNORMAL HIGH (ref 70–99)

## 2012-03-15 NOTE — Plan of Care (Deleted)
Problem: Phase III Progression Outcomes Goal: Activity at appropriate level-compared to baseline (UP IN CHAIR FOR HEMODIALYSIS)  Outcome: Completed/Met Date Met:  03/15/12 Pt able to walk to the bathroom and sit up on the chair by self. Goal: Discharge plan remains appropriate-arrangements made Outcome: Not Progressing Pt remains chest pain free during shift but card enzymes like Trops continue to elevate. On Hep and NS fluid. Renal pt. Goal: Demonstrates TCDB, IS independently Outcome: Progressing Pt able to TCDB appropriately.

## 2012-03-15 NOTE — Plan of Care (Signed)
Problem: Phase III Progression Outcomes Goal: Activity at appropriate level-compared to baseline (UP IN CHAIR FOR HEMODIALYSIS)  Outcome: Not Progressing Pt will need rehab post surgery this Tue. Goal: Discharge plan remains appropriate-arrangements made Outcome: Progressing After surgery pt will have rehab done Goal: Demonstrates TCDB, IS independently Outcome: Progressing Pt able to use IS, TCDB

## 2012-03-15 NOTE — Plan of Care (Signed)
Problem: Phase III Progression Outcomes Goal: Activity at appropriate level-compared to baseline (UP IN CHAIR FOR HEMODIALYSIS)  Outcome: Not Progressing Persistent weakness. Speech slurred per family. Neuro checks performed. RRT called to verify no stroke symptoms. Pt cleared for not having stroke.Will continue to monitor. Goal: Discharge plan remains appropriate-arrangements made Outcome: Completed/Met Date Met:  03/15/12 Pt will go to rehab before DC home. Goal: Demonstrates TCDB, IS independently Outcome: Progressing Needs reinforcement before procedure on Tue.  Problem: Phase III Progression Outcomes Goal: Pain controlled on oral analgesia Outcome: Completed/Met Date Met:  03/15/12 Pain well controled with meds. Pain at patients tolerable level. Will continue to monitor and intervene if necessary.    Goal: Transfers with minimal assist Outcome: Not Progressing Pt has lower extremities weakness. Hx of Hemiparesis. Strongly recommend rehab before DC on self transfers.

## 2012-03-15 NOTE — Progress Notes (Signed)
Subjective: Interval History: none.. Incisional soreness  Objective: Vital signs in last 24 hours: Temp:  [99.2 F (37.3 C)-99.6 F (37.6 C)] 99.6 F (37.6 C) (08/25 0356) Pulse Rate:  [81-90] 81  (08/25 1132) Resp:  [16-18] 18  (08/25 0356) BP: (123-144)/(81-84) 136/83 mmHg (08/25 1132) SpO2:  [94 %-100 %] 100 % (08/25 0356)  Intake/Output from previous day: 08/24 0701 - 08/25 0700 In: 470 [P.O.:120; I.V.:200; IV Piggyback:150] Out: 400 [Urine:400] Intake/Output this shift: Total I/O In: 240 [P.O.:240] Out: 400 [Urine:400]  Surgical wds healing.  Dry gangrene left foot  Lab Results:  Basename 03/13/12 0545  WBC 10.9*  HGB 9.5*  HCT 29.7*  PLT 208   BMET  Basename 03/13/12 0545  NA 140  K 3.7  CL 108  CO2 23  GLUCOSE 132*  BUN 10  CREATININE 0.94  CALCIUM 8.2*    Studies/Results: Dg Chest 2 View  03/10/2012  *RADIOLOGY REPORT*  Clinical Data: Preop.  Hypertension.  CHEST - 2 VIEW  Comparison: 01/29/2011  Findings: Rightward scoliosis in the thoracic spine. Heart and mediastinal contours are within normal limits.  No focal opacities or effusions.  No acute bony abnormality.  IMPRESSION: No active cardiopulmonary disease.   Original Report Authenticated By: Cyndie Chime, M.D.    Nm Myocar Multi W/spect W/wall Motion / Ef  02/27/2012  *RADIOLOGY REPORT*  Clinical Data:  Surgical clearance.  Coronary artery disease.  MYOCARDIAL IMAGING WITH SPECT (REST AND PHARMACOLOGIC-STRESS) GATED LEFT VENTRICULAR WALL MOTION STUDY LEFT VENTRICULAR EJECTION FRACTION  Technique:  Standard myocardial SPECT imaging was performed after resting intravenous injection of 10 mCi Tc-22m tetrofosmin. Subsequently, intravenous infusion of Lexiscan was performed under the supervision of the Cardiology staff.  At peak effect of the drug, 30 mCi Tc-68m tetrofosmin was injected intravenously and standard myocardial SPECT  imaging was performed.  Quantitative gated imaging was also performed to  evaluate left ventricular wall motion, and estimate left ventricular ejection fraction.  Comparison:  None  Findings: SPECT imaging demonstrates large fixed defect in the inferolateral wall with a smaller defect in the anteroseptal wall. Findings compatible with old infarcts/scar.  No significant reversibility to suggest ischemia.  Quantitative gated analysis shows generalized hypokinesia with severe hypokinesia in the inferolateral wall.  The resting left ventricular ejection fraction is 29% with end- diastolic volume of 157 ml and end-systolic volume of 111 ml.  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  Smaller scar in the anteroseptal wall.  No evidence of ischemia.  Ejection fraction 29%.  Original Report Authenticated By: Cyndie Chime, M.D.   Anti-infectives: Anti-infectives     Start     Dose/Rate Route Frequency Ordered Stop   03/12/12 0600   vancomycin (VANCOCIN) 750 mg in sodium chloride 0.9 % 150 mL IVPB        750 mg 150 mL/hr over 60 Minutes Intravenous Every 12 hours 03/11/12 2215     03/11/12 2330   piperacillin-tazobactam (ZOSYN) IVPB 3.375 g        3.375 g 12.5 mL/hr over 240 Minutes Intravenous Every 8 hours 03/11/12 2215     03/10/12 1029   cefUROXime (ZINACEF) 1.5 g in dextrose 5 % 50 mL IVPB        1.5 g 100 mL/hr over 30 Minutes Intravenous 30 min pre-op 03/10/12 1029 03/11/12 0901          Assessment/Plan: s/p Procedure(s) (LRB): BYPASS GRAFT FEMORAL-POPLITEAL ARTERY (Left) Cont to watch foot. Pain control   LOS: 4  days   EARLY, TODD 03/15/2012, 12:16 PM

## 2012-03-16 LAB — GLUCOSE, CAPILLARY
Glucose-Capillary: 52 mg/dL — ABNORMAL LOW (ref 70–99)
Glucose-Capillary: 98 mg/dL (ref 70–99)
Glucose-Capillary: 234 mg/dL — ABNORMAL HIGH (ref 70–99)

## 2012-03-16 MED ORDER — GLUCOSE 40 % PO GEL
1.0000 | ORAL | Status: DC | PRN
  Administered 2012-03-16: 37.5 g via ORAL

## 2012-03-16 MED ORDER — GLUCOSE 40 % PO GEL
ORAL | Status: AC
  Administered 2012-03-16: 37.5 g via ORAL
  Filled 2012-03-16: qty 1

## 2012-03-16 MED ORDER — INSULIN ASPART PROT & ASPART (70-30 MIX) 100 UNIT/ML ~~LOC~~ SUSP
10.0000 [IU] | Freq: Every day | SUBCUTANEOUS | Status: DC
  Administered 2012-03-16: 10 [IU] via SUBCUTANEOUS
  Filled 2012-03-16: qty 3

## 2012-03-16 NOTE — Progress Notes (Signed)
CRITICAL VALUE ALERT  Critical value received:  CBG=52  Date of notification:  03/16/2012  Time of notification:  0629  Critical value read back:yes  Nurse who received alert:  Bkebu  MD notified (1st page):  Per protocol hypoglycemic coverage.  Will recheck CBG at 0710 then page MD if needed. Pt asymptomatic.

## 2012-03-16 NOTE — Telephone Encounter (Signed)
Left Message- l/v/m to let him know he needs to call dr. Wynn Banker office to make an appointment for pain management

## 2012-03-16 NOTE — Progress Notes (Signed)
Asked by primary RN to see pt for possible changes in speech.  Per family pt's speech is "sounding different than normal."  Speech assessed using NIHSS and no appreciable deficits.  No LSN or time frame for questionable changes in speech could be established.  Discussed findings with primary RN.

## 2012-03-16 NOTE — Progress Notes (Signed)
Physical Therapy Treatment Patient Details Name: Jaime [name] MRN: [id number] DOB: 05-02-1941 Today's Date: 03/16/2012 Time: 8469-6295 PT Time Calculation (min): 22 min  PT Assessment / Plan / Recommendation Comments on Treatment Session  71 y.o. male s/p L leg bypass graft with complicating dx of MS.  He presents today helping more with bed mobility.  He states that he feels ever so slightly stronger as he was able to kick both feet EOB today and was unable to kick or help with his right leg before.  He continues to be extremely debilitated and would need a mechanical lift to get safely OOB.      Follow Up Recommendations  Home health PT;Supervision/Assistance - 24 hour    Barriers to Discharge        Equipment Recommendations  Hospital bed;Other (comment) (hoyer lift)    Recommendations for Other Services    Frequency Min 3X/week   Plan Discharge plan remains appropriate    Precautions / Restrictions Precautions Precautions: Fall   Pertinent Vitals/Pain No reports of pain    Mobility  Bed Mobility Supine to Sit: 1: +2 Total assist;HOB elevated;With rails Supine to Sit: Patient Percentage: 30% Sitting - Scoot to Edge of Bed: 1: +2 Total assist;With rail Sitting - Scoot to Edge of Bed: Patient Percentage: 30% Sit to Supine: 1: +2 Total assist;HOB flat;With rail Sit to Supine: Patient Percentage: 10% Details for Bed Mobility Assistance: total assist to support trunk and hips to get EOB.  The patient was able to progress left leg over the side of the bed to sit up, needed assist with left leg, hips and trunk.  He was able to pull with his upper extremities to help with transfers.   Transfers Transfers: Not assessed (pt declined wanting to get OOB)     PT Goals Acute Rehab PT Goals PT Goal: Supine/Side to Sit - Progress: Progressing toward goal PT Goal: Sit to Supine/Side - Progress: Progressing toward goal PT Transfer Goal: Bed to Chair/Chair to Bed - Progress:  Progressing toward goal  Visit Information  Last PT Received On: 03/16/12 Assistance Needed: +2    Subjective Data  Subjective: Pt reports he absolutely does not want to get up to the recliner chair because he was left in the chair for 5 hours after the last PT session.  Even when I told him I would personally come back and get him out of the chair whenever he wanted he declined.  He was agreeable to work with [place] at EOB.        Balance  Static Sitting Balance Static Sitting - Balance Support: Bilateral upper extremity supported;Feet supported Static Sitting - Level of Assistance: 1: +2 Total assist;Patient percentage (comment) (pt 40%) Static Sitting - Comment/# of Minutes: 15 mins EOB with left hand on rail and right hand on PT's belt, also tried both pt's hands on PT's shoulders which he felt was more secure and helped increase his upright posture, otherwise his trunk would collapse on his ribcage due to weakness and scoliosis and he would have increased WOB.    End of Session PT - End of Session Equipment Utilized During Treatment: Gait belt Activity Tolerance: Patient limited by fatigue Patient left: in bed;with call bell/phone within reach Nurse Communication: Other (comment) (need to re-apply condom catheter and pt had a BM)     Abbey Veith B. Tanae Petrosky, PT, DPT [phone]   03/16/2012, 2:41 PM

## 2012-03-16 NOTE — Progress Notes (Signed)
[facility]  Daily Progress Note  Assessment/Planning: POD #5 s/p TPT and PT EA and BPA, Left femoral to pop bypass   I suspect the tibial arteries on the left are clotted off and subsequently the bypass graft.    As we have discussed previously, this makes a TMA unlikely to heal so I would proceed a L TMA vs AKA in this patient, expecting to have to do the AKA. I discussed in depth the nature of transmetarsal and the above-the-knee amputation with the patient, including risks, benefits, and alternatives.   The patient is aware that the risks of amputation include but are not limited to: bleeding, infection, myocardial infarction, stroke, death, failure to heal amputation wound, and possible need for more proximal amputation.   The patient is aware of the risks and agrees proceed forward with the procedure, which is scheduled for 830 AM tomorrow.  Subjective  - 5 Days Post-Op  Soreness in left leg  Objective Filed Vitals:   03/15/12 1132 03/15/12 1512 03/15/12 2022 03/16/12 0448  BP: 136/83 122/79 125/82 122/80  Pulse: 81 77 77 78  Temp:  99.5 F (37.5 C) 99.8 F (37.7 C) 98.5 F (36.9 C)  TempSrc:  Oral Oral Oral  Resp:  16 18 18   Height:      Weight:    186 lb 9.6 oz (84.641 kg)  SpO2:  98% 97% 98%    Intake/Output Summary (Last 24 hours) at 03/16/12 0757 Last data filed at 03/16/12 0500  Gross per 24 hour  Intake    720 ml  Output   1700 ml  Net   -980 ml    PULM  CTAB CV  RRR GI  soft, NTND VASC  L calf cool, L foot wrapped  Laboratory CBC    Component Value Date/Time   WBC 10.9* 03/13/2012 0545   HGB 9.5* 03/13/2012 0545   HCT 29.7* 03/13/2012 0545   PLT 208 03/13/2012 0545    BMET    Component Value Date/Time   NA 140 03/13/2012 0545   K 3.7 03/13/2012 0545   CL 108 03/13/2012 0545   CO2 23 03/13/2012 0545   GLUCOSE 132* 03/13/2012 0545   GLUCOSE 110* 05/12/2006 1139   BUN 10 03/13/2012 0545   CREATININE 0.94 03/13/2012  0545   CALCIUM 8.2* 03/13/2012 0545   GFRNONAA 83* 03/13/2012 0545   GFRAA >90 03/13/2012 0545    Leonides Sake, MD [facility] Office: [phone] Pager: [phone]  03/16/2012, 7:57 AM

## 2012-03-16 NOTE — Progress Notes (Signed)
ANTIBIOTIC CONSULT NOTE - FOLLOW UP  Pharmacy Consult for Vancomycin & Zosyn Indication: Left foot gangrene  Allergies  Allergen Reactions  . Bee Venom Anaphylaxis    Patient Measurements: Height: 5\' 10"  (177.8 cm) Weight: 186 lb 9.6 oz (84.641 kg) IBW/kg (Calculated) : 73   Vital Signs: Temp: 98.5 F (36.9 C) (08/26 0448) Temp src: Oral (08/26 0448) BP: 122/80 mmHg (08/26 0448) Pulse Rate: 78  (08/26 0448) Intake/Output from previous day: 08/25 0701 - 08/26 0700 In: 720 [P.O.:720] Out: 1700 [Urine:1700]  Labs:  last chemistries and CBC 8/23; for recheck 8/27 am   Micro:   8/23 urine culture - negative   8/21 MRSA screen - negative  Assessment:   Day # 5 Vancomycin and Zosyn   Last Scr 0.94 on 8/23.  Afebrile, WBC 10.9 on 8/23.     For bmet and CBC in am.     Planning  L TMA vs AKA on 8/27.  Goal of Therapy:  Vancomycin trough level 10-15 mcg/ml Appropriate Zosyn dose for renal function and infection  Plan:   Continue Vancomycin 750 mg IV q12hrs.  Continue Zosyn 3.375 grams IV q8hrs (each infused over 4 hours).  Follow-up am bmet and CBC.  Follow-up post-op 8/27 for length of therapy plans.  Dennie Fetters, [place] Pager: [phone] 03/16/2012,11:03 AM

## 2012-03-17 ENCOUNTER — Inpatient Hospital Stay (HOSPITAL_COMMUNITY): Payer: Medicare Other | Admitting: Anesthesiology

## 2012-03-17 ENCOUNTER — Encounter (HOSPITAL_COMMUNITY)
Admission: RE | Disposition: A | Discharge status: Rehabilitation Facility | Payer: Self-pay | Source: Ambulatory Visit | Attending: Vascular Surgery

## 2012-03-17 ENCOUNTER — Encounter (HOSPITAL_COMMUNITY): Payer: Self-pay | Admitting: Anesthesiology

## 2012-03-17 DIAGNOSIS — Z23 Encounter for immunization: Secondary | ICD-10-CM | POA: Diagnosis not present

## 2012-03-17 DIAGNOSIS — I70269 Atherosclerosis of native arteries of extremities with gangrene, unspecified extremity: Secondary | ICD-10-CM | POA: Diagnosis not present

## 2012-03-17 DIAGNOSIS — I96 Gangrene, not elsewhere classified: Secondary | ICD-10-CM | POA: Diagnosis not present

## 2012-03-17 DIAGNOSIS — L97909 Non-pressure chronic ulcer of unspecified part of unspecified lower leg with unspecified severity: Secondary | ICD-10-CM | POA: Diagnosis not present

## 2012-03-17 DIAGNOSIS — I739 Peripheral vascular disease, unspecified: Secondary | ICD-10-CM | POA: Diagnosis not present

## 2012-03-17 DIAGNOSIS — I70509 Unspecified atherosclerosis of nonautologous biological bypass graft(s) of the extremities, unspecified extremity: Secondary | ICD-10-CM | POA: Diagnosis not present

## 2012-03-17 DIAGNOSIS — I5022 Chronic systolic (congestive) heart failure: Secondary | ICD-10-CM | POA: Diagnosis not present

## 2012-03-17 DIAGNOSIS — I70219 Atherosclerosis of native arteries of extremities with intermittent claudication, unspecified extremity: Secondary | ICD-10-CM | POA: Diagnosis not present

## 2012-03-17 HISTORY — PX: AMPUTATION: SHX166

## 2012-03-17 LAB — GLUCOSE, CAPILLARY
Glucose-Capillary: 79 mg/dL (ref 70–99)
Glucose-Capillary: 75 mg/dL (ref 70–99)
Glucose-Capillary: 151 mg/dL — ABNORMAL HIGH (ref 70–99)
Glucose-Capillary: 158 mg/dL — ABNORMAL HIGH (ref 70–99)

## 2012-03-17 LAB — CBC
HCT: 30.5 % — ABNORMAL LOW (ref 39.0–52.0)
Hemoglobin: 9.9 g/dL — ABNORMAL LOW (ref 13.0–17.0)
MCHC: 32.5 g/dL (ref 30.0–36.0)
MCV: 80.3 fL (ref 78.0–100.0)

## 2012-03-17 LAB — BASIC METABOLIC PANEL
BUN: 8 mg/dL (ref 6–23)
CO2: 26 mEq/L (ref 19–32)
Chloride: 104 mEq/L (ref 96–112)
GFR calc Af Amer: >90 mL/min (ref >90)
Potassium: 3.7 mEq/L (ref 3.5–5.1)

## 2012-03-17 SURGERY — AMPUTATION, ABOVE KNEE
Anesthesia: General | Site: Leg Upper | Laterality: Left | Wound class: Clean

## 2012-03-17 MED ORDER — BACITRACIN ZINC 500 UNIT/GM EX OINT: TOPICAL_OINTMENT | CUTANEOUS | Status: DC | PRN

## 2012-03-17 MED ORDER — PHENOL 1.4 % MT LIQD: 1.0000 | OROMUCOSAL | Status: DC | PRN

## 2012-03-17 MED ORDER — FLEET ENEMA 7-19 GM/118ML RE ENEM
1.0000 | ENEMA | Freq: Once | RECTAL | Status: AC | PRN
  Filled 2012-03-17: qty 1

## 2012-03-17 MED ORDER — LACTATED RINGERS IV SOLN
INTRAVENOUS | Status: DC | PRN
  Administered 2012-03-17: 08:00:00 via INTRAVENOUS

## 2012-03-17 MED ORDER — ONDANSETRON HCL 4 MG/2ML IJ SOLN
INTRAMUSCULAR | Status: DC | PRN
  Administered 2012-03-17: 4 mg via INTRAVENOUS

## 2012-03-17 MED ORDER — DEXTROSE 5 % IV SOLN: 1.5000 g | Freq: Two times a day (BID) | INTRAVENOUS | Status: DC

## 2012-03-17 MED ORDER — ALUM & MAG HYDROXIDE-SIMETH 200-200-20 MG/5ML PO SUSP: 15.0000 mL | ORAL | Status: DC | PRN

## 2012-03-17 MED ORDER — OXYCODONE HCL 5 MG PO TABS: 5.0000 mg | ORAL_TABLET | ORAL | Status: DC | PRN

## 2012-03-17 MED ORDER — DOCUSATE SODIUM 100 MG PO CAPS: 100.0000 mg | ORAL_CAPSULE | Freq: Every day | ORAL | Status: DC

## 2012-03-17 MED ORDER — METOPROLOL TARTRATE 1 MG/ML IV SOLN: 2.0000 mg | INTRAVENOUS | Status: DC | PRN

## 2012-03-17 MED ORDER — GUAIFENESIN-DM 100-10 MG/5ML PO SYRP: 15.0000 mL | ORAL_SOLUTION | ORAL | Status: DC | PRN

## 2012-03-17 MED ORDER — PHENYLEPHRINE HCL 10 MG/ML IJ SOLN
10.0000 mg | INTRAVENOUS | Status: DC | PRN
  Administered 2012-03-17: 20 ug/min via INTRAVENOUS

## 2012-03-17 MED ORDER — BISACODYL 10 MG RE SUPP: 10.0000 mg | Freq: Every day | RECTAL | Status: DC | PRN

## 2012-03-17 MED ORDER — PROPOFOL 10 MG/ML IV EMUL
INTRAVENOUS | Status: DC | PRN
  Administered 2012-03-17: 200 mg via INTRAVENOUS

## 2012-03-17 MED ORDER — LABETALOL HCL 5 MG/ML IV SOLN: 10.0000 mg | INTRAVENOUS | Status: DC | PRN

## 2012-03-17 MED ORDER — FENTANYL CITRATE 0.05 MG/ML IJ SOLN
INTRAMUSCULAR | Status: DC | PRN
  Administered 2012-03-17 (×3): 50 ug via INTRAVENOUS

## 2012-03-17 MED ORDER — SENNOSIDES-DOCUSATE SODIUM 8.6-50 MG PO TABS: 1.0000 | ORAL_TABLET | Freq: Every evening | ORAL | Status: DC | PRN

## 2012-03-17 MED ORDER — PANTOPRAZOLE SODIUM 40 MG PO TBEC: 40.0000 mg | DELAYED_RELEASE_TABLET | Freq: Every day | ORAL | Status: DC

## 2012-03-17 MED ORDER — HYDRALAZINE HCL 20 MG/ML IJ SOLN: 10.0000 mg | INTRAMUSCULAR | Status: DC | PRN

## 2012-03-17 MED ORDER — BACITRACIN ZINC 500 UNIT/GM EX OINT
TOPICAL_OINTMENT | CUTANEOUS | Status: AC
  Filled 2012-03-17: qty 15

## 2012-03-17 MED ORDER — 0.9 % SODIUM CHLORIDE (POUR BTL) OPTIME
TOPICAL | Status: DC | PRN
  Administered 2012-03-17: 1000 mL

## 2012-03-17 MED ORDER — ENOXAPARIN SODIUM 40 MG/0.4ML ~~LOC~~ SOLN: 40.0000 mg | SUBCUTANEOUS | Status: DC

## 2012-03-17 MED ORDER — ONDANSETRON HCL 4 MG/2ML IJ SOLN: 4.0000 mg | Freq: Four times a day (QID) | INTRAMUSCULAR | Status: DC | PRN

## 2012-03-17 MED ORDER — MORPHINE SULFATE 2 MG/ML IJ SOLN: 2.0000 mg | INTRAMUSCULAR | Status: DC | PRN

## 2012-03-17 MED ORDER — ACETAMINOPHEN 325 MG PO TABS: 325.0000 mg | ORAL_TABLET | ORAL | Status: DC | PRN

## 2012-03-17 MED ORDER — ACETAMINOPHEN 650 MG RE SUPP: 325.0000 mg | RECTAL | Status: DC | PRN

## 2012-03-17 MED ORDER — ALBUMIN HUMAN 5 % IV SOLN
INTRAVENOUS | Status: DC | PRN
  Administered 2012-03-17: 09:00:00 via INTRAVENOUS

## 2012-03-17 MED ORDER — SODIUM CHLORIDE 0.9 % IV SOLN
INTRAVENOUS | Status: DC
  Administered 2012-03-17: 13:00:00 via INTRAVENOUS

## 2012-03-17 SURGICAL SUPPLY — 47 items
BANDAGE ELASTIC 4 VELCRO ST LF (GAUZE/BANDAGES/DRESSINGS) ×3 IMPLANT
BANDAGE ELASTIC 6 VELCRO ST LF (GAUZE/BANDAGES/DRESSINGS) ×3 IMPLANT
BANDAGE ESMARK 6X9 LF (GAUZE/BANDAGES/DRESSINGS) IMPLANT
BANDAGE GAUZE ELAST BULKY 4 IN (GAUZE/BANDAGES/DRESSINGS) ×3 IMPLANT
BLADE SAW SAG 29X58X.64 (BLADE) ×3 IMPLANT
BNDG COHESIVE 6X5 TAN STRL LF (GAUZE/BANDAGES/DRESSINGS) ×3 IMPLANT
BNDG ESMARK 6X9 LF (GAUZE/BANDAGES/DRESSINGS)
CANISTER SUCTION 2500CC (MISCELLANEOUS) ×3 IMPLANT
CLIP TI MEDIUM 6 (CLIP) ×3 IMPLANT
CLOTH BEACON ORANGE TIMEOUT ST (SAFETY) ×3 IMPLANT
COVER SURGICAL LIGHT HANDLE (MISCELLANEOUS) ×3 IMPLANT
COVER TABLE BACK 60X90 (DRAPES) ×3 IMPLANT
CUFF TOURNIQUET SINGLE 24IN (TOURNIQUET CUFF) IMPLANT
CUFF TOURNIQUET SINGLE 34IN LL (TOURNIQUET CUFF) ×3 IMPLANT
CUFF TOURNIQUET SINGLE 44IN (TOURNIQUET CUFF) IMPLANT
DRAIN CHANNEL 19F RND (DRAIN) IMPLANT
DRAPE ORTHO SPLIT 77X108 STRL (DRAPES) ×2
DRAPE PROXIMA HALF (DRAPES) ×3 IMPLANT
DRAPE SURG ORHT 6 SPLT 77X108 (DRAPES) ×4 IMPLANT
DRSG ADAPTIC 3X8 NADH LF (GAUZE/BANDAGES/DRESSINGS) ×3 IMPLANT
ELECT REM PT RETURN 9FT ADLT (ELECTROSURGICAL) ×3
ELECTRODE REM PT RTRN 9FT ADLT (ELECTROSURGICAL) ×2 IMPLANT
EVACUATOR SILICONE 100CC (DRAIN) IMPLANT
GLOVE BIO SURGEON STRL SZ7 (GLOVE) ×3 IMPLANT
GLOVE BIOGEL PI IND STRL 7.5 (GLOVE) ×2 IMPLANT
GLOVE BIOGEL PI INDICATOR 7.5 (GLOVE) ×1
GOWN STRL NON-REIN LRG LVL3 (GOWN DISPOSABLE) ×9 IMPLANT
KIT BASIN OR (CUSTOM PROCEDURE TRAY) ×3 IMPLANT
KIT ROOM TURNOVER OR (KITS) ×3 IMPLANT
NS IRRIG 1000ML POUR BTL (IV SOLUTION) ×3 IMPLANT
PACK GENERAL/GYN (CUSTOM PROCEDURE TRAY) ×3 IMPLANT
PAD ARMBOARD 7.5X6 YLW CONV (MISCELLANEOUS) ×6 IMPLANT
PADDING CAST COTTON 6X4 STRL (CAST SUPPLIES) IMPLANT
SPONGE GAUZE 4X4 12PLY (GAUZE/BANDAGES/DRESSINGS) ×3 IMPLANT
STAPLER VISISTAT 35W (STAPLE) ×3 IMPLANT
STOCKINETTE IMPERVIOUS LG (DRAPES) ×3 IMPLANT
SUT ETHILON 3 0 PS 1 (SUTURE) IMPLANT
SUT SILK 0 TIES 10X30 (SUTURE) IMPLANT
SUT SILK 2 0 (SUTURE) ×1
SUT SILK 2 0 SH CR/8 (SUTURE) ×3 IMPLANT
SUT SILK 2-0 18XBRD TIE 12 (SUTURE) ×2 IMPLANT
SUT VIC AB 2-0 CT1 18 (SUTURE) ×6 IMPLANT
SUT VIC AB 3-0 SH 18 (SUTURE) IMPLANT
TOWEL OR 17X24 6PK STRL BLUE (TOWEL DISPOSABLE) ×3 IMPLANT
TOWEL OR 17X26 10 PK STRL BLUE (TOWEL DISPOSABLE) ×3 IMPLANT
UNDERPAD 30X30 INCONTINENT (UNDERPADS AND DIAPERS) ×3 IMPLANT
WATER STERILE IRR 1000ML POUR (IV SOLUTION) ×3 IMPLANT

## 2012-03-17 NOTE — Progress Notes (Signed)
UR Completed.  Jaime [name] Jane [phone] 03/17/2012  

## 2012-03-17 NOTE — Progress Notes (Signed)
Patient having periods of confusion. C/o pain; controlled with medications. Family at bedside. Spoke to Dr. Darrick Penna; may have sitter if needed. Charge nurse informed. Jaime [name]

## 2012-03-17 NOTE — Interval H&P Note (Signed)
[facility] [place]  History and Physical Update  The patient was interviewed and re-examined.  The patient's previous History and Physical has been reviewed and is unchanged except for: interval revascularization of left foot.  Plan is left TMA vs AKA, depending on quality of bleeding from bleeding in the plantar surface.  Leonides Sake, MD [facility] [place] Office: [phone] Pager: [phone]  03/17/2012, 8:10 AM

## 2012-03-17 NOTE — Consult Note (Signed)
Physical Medicine and Rehabilitation Consult Reason for Consult: L-AKA Referring Physician:  Dr. Imogene Burn    HPI: Jaime [name] is a 71 y.o. male with history of MS with paraplegia, DM type 2, PVD with ischemic right foot and rest pain.  Admitted on 03/11/12 for left CFA to tibioperoneal trunk bypass by Dr. Imogene Burn.  Post op with loss of pulse due to acute thrombosis of tibioperoneal trunk with concomitant thrombosis of distal femorotibioperoneal bypass. He was taken back to OR for thrombectomy late that pm but with minimal perfusion of foot. L-  AKA advised and patient may need R-AKA int he future if symptomatic.  Patient agreeable to procedure and underwent L-AKA on 03/17/12. Has had confusion overnight.  MD, family requesting CIR for progression.  Pt does not remember me despite seeing me in office last week.No pain c/os in L stump or R foot  Review of Systems  Unable to perform ROS: mental acuity   Past Medical History  Diagnosis Date  . Hyperlipidemia   . Cardiomyopathy, ischemic     EF 45% per ECHO 2008  . Pulmonary hypertension     moderate ECHO Jan 2008  . CHF (congestive heart failure)   . Systolic heart failure   . Multiple sclerosis DX 1993    NEUROLOGIST-  DR DOHMEIER -- LAST VISIT 11-20-2010  NOTE W/ CHART  . Lung nodule     resolved 11-2006 CT Chest  . Tobacco abuse   . Increased prostate specific antigen (PSA) velocity   . Urinary retention     dx ~ 2-12, like from MS, now with a catheter, saw urology  . Ejection fraction      EF 45%, echo, 2008  . Cellulitis   . Pre-syncope     Near syncope, ER visit, October, 2011  . Carotid artery disease     Doppler, February, 2012, 0-39% bilateral,Mild smooth plaque  . Chronic indwelling foley catheter   . Scoliosis associated with other condition   . Hemiparesis   . Fatigue SEVERE  . Weakness   . Impotence   . MI, acute, non ST segment elevation 2007    S/P PCI WITH X1 STENT (TAXUS DRUG-ELUTING) LEFT CIRCUMFLEX  .  Echocardiogram abnormal 08-17-2006    SEVERE HYPOKINESIS OF INFERIOR , POSTEROR WALLS OF BASE AND MID VENTRICLE AND MOD. PULMONARY HTN  . Diabetes mellitus     INSULIN-DEPENDANT  . Hypertension   . H/O pleural effusion 2008    POST THORACENTESIS  . History of colon polyps PRECANCEROUS  . Insomnia   . Ingrown right big toenail CHRONIC    PER PT VERY SORE  . CAD (coronary artery disease) CARDIOLOGIST- DR KATZ-- VISIT 06-05-2011 IN [facility]    non-STEMI, 2007.Marland Kitchenoccluded circumflex.. Taxus stent placed...residual 80% LAD...50% RCA  . Pneumonia     hx of  . Urinary tract infection     hx of  . Neuromuscular disorder     multiple sclerosis  . Atherosclerotic PVD with ulceration     left foot   Past Surgical History  Procedure Date  . Hernia repair 1990    (R)  . Coronary angioplasty with stent placement 05-01-2006    OCCLUDED CIRCUMFLEX -- TAXUS STENT PLACMENT  AND RESIDUAL 80% LAD,  50% RCA  . Thoracentesis 2008    PLEURAL EFFUSION  . Cystoscopy 07/30/2011    Procedure: CYSTOSCOPY;  Surgeon: Lindaann Slough, MD;  Location: [facility];  Service: Urology;  Laterality: N/A;  . Transurethral resection of  prostate 07/30/2011    Procedure: TRANSURETHRAL RESECTION OF THE PROSTATE (TURP);  Surgeon: Lindaann Slough, MD;  Location: [facility];  Service: Urology;  Laterality: N/A;  . Femoral-tibial bypass graft 03/11/2012    Procedure: BYPASS GRAFT FEMORAL-TIBIAL ARTERY;  Surgeon: Fransisco Hertz, MD;  Location[facility] OR;  Service: Vascular;  Laterality: Left;  Left Femoral -Tibial trunk bypass, Endarterectomy of Tibial- Peroneal trunk with vein angioplasty.  . Intraoperative arteriogram 03/11/2012    Procedure: INTRA OPERATIVE ARTERIOGRAM;  Surgeon: Fransisco Hertz, MD;  Location: [facility] OR;  Service: Vascular;  Laterality: Left;  . Femoral-popliteal bypass graft 03/11/2012    Procedure: BYPASS GRAFT FEMORAL-POPLITEAL ARTERY;  Surgeon: Fransisco Hertz, MD;  Location: [facility] OR;  Service:  Vascular;  Laterality: Left;  embolectomy left lower leg   Family History  Problem Relation Age of Onset  . Hypertension    . Heart attack Mother 65  . Heart disease Mother   . Stroke Mother   . Hyperlipidemia Mother   . Hypertension Mother   . Colon cancer Neg Hx   . Prostate cancer Neg Hx   . COPD Father   . Peripheral vascular disease Father   . Diabetes Brother   . Heart disease Brother   . Hypertension Brother   . Heart attack Brother   . Diabetes Daughter    Social History: Married. Disabled since '94. Needed assistance with ADL's -sponge bathes.  Needed assistance with squat transfers. Has a hospital bed with trapeze bar. Uses a power wheelchair.   reports that he has quit smoking. His smoking use included Cigarettes. He has a 50 pack-year smoking history. He has never used smokeless tobacco. He reports that he does not drink alcohol or use illicit drugs.   Allergies  Allergen Reactions  . Bee Venom Anaphylaxis   Medications Prior to Admission  Medication Sig Dispense Refill  . amantadine (SYMMETREL) 100 MG capsule Take 200 mg by mouth 3 (three) times daily. By urologist      . aspirin EC 81 MG tablet Take 81 mg by mouth daily.      . diclofenac sodium (VOLTAREN) 1 % GEL Apply 1 application topically 4 (four) times daily as needed. For pain      . gabapentin (NEURONTIN) 300 MG capsule Take 300 mg by mouth 3 (three) times daily.      Marland Kitchen HYDROcodone-acetaminophen (VICODIN) 5-500 MG per tablet Take 1 tablet by mouth every 6 (six) hours as needed. For pain       . insulin aspart protamine-insulin aspart (NOVOLOG 70/30) (70-30) 100 UNIT/ML injection Inject 10-30 Units into the skin 2 (two) times daily. 30 units at breakfast and 10 units at 10pm      . meloxicam (MOBIC) 15 MG tablet Take 15 mg by mouth daily.      . metoprolol succinate (TOPROL-XL) 25 MG 24 hr tablet Take 25 mg by mouth daily.      . Oxycodone HCl 10 MG TABS Take 10 mg by mouth 3 (three) times daily.      .  simvastatin (ZOCOR) 40 MG tablet Take 1 tablet (40 mg total) by mouth at bedtime.  90 tablet  1    Home: Home Living Lives With: Spouse Available Help at Discharge: Family;Available 24 hours/day Type of Home: House Home Access: Ramped entrance Home Layout: One level Home Adaptive Equipment: Walker - rolling;Straight cane;Wheelchair - powered Additional Comments: pt with inconsistent responses at one point stating he stands and another time stating  he can't.  Found wife in waiting room who notes pt has been unable to [facility] for years.  pt's Home situation and PLOF entered into epic are per wife as pt with cognitive deficits.    Functional History: Prior Function Bath: Maximal Toileting: Total Dressing: Maximal Grooming: Minimal Feeding: Minimal Meal Prep: Total Light Housekeeping: Total Able to Take Stairs?: No Driving: No Vocation: On disability Comments: Per wife pt would need A to slide transfer from bed to power chair.  Wife bathed pt in bed, pt incontinent of bowel and bladder.  Wife notes that her and pt have been struggling at home and we discussed HHAide and Mechanical lift.   Functional Status:  Mobility: Bed Mobility Bed Mobility: Supine to Sit;Sitting - Scoot to Edge of Bed Supine to Sit: 1: +2 Total assist;HOB elevated;With rails Supine to Sit: Patient Percentage: 30% Sitting - Scoot to Edge of Bed: 1: +2 Total assist;With rail Sitting - Scoot to Edge of Bed: Patient Percentage: 30% Sit to Supine: 1: +2 Total assist;HOB flat;With rail Sit to Supine: Patient Percentage: 10% Transfers Transfers: Not assessed (pt declined wanting to get OOB) Squat Pivot Transfers: 1: +2 Total assist;From elevated surface Squat Pivot Transfers: Patient Percentage: 10% Ambulation/Gait Ambulation/Gait Assistance: Not tested (comment) (not applicable) Stairs: No Wheelchair Mobility Wheelchair Mobility: No  ADL:    Cognition: Cognition Arousal/Alertness: Awake/alert Orientation  Level: Oriented X4 Cognition Overall Cognitive Status: History of cognitive impairments - further impaired Area of Impairment: Attention;Memory;Safety/judgement;Awareness of errors;Awareness of deficits;Problem solving;Executive functioning Arousal/Alertness: Awake/alert Orientation Level: Disoriented to;Time;Situation Behavior During Session: [facility] for tasks performed Current Attention Level: Sustained Memory Deficits: pt with decreased recal of home set-up and PLOF.   Safety/Judgement: Decreased safety judgement for tasks assessed;Impulsive;Decreased awareness of need for assistance Awareness of Errors: Assistance required to identify errors made;Assistance required to correct errors made Awareness of Deficits: pt impulsive and thinks he is able to do more than he really can.   Cognition - Other Comments: Per wife pt tries to get himself into power chair on his own and often needs help to prevent falling  or at times has fallen and needs A back to bed.    Blood pressure 147/75, pulse 70, temperature 99 F (37.2 C), temperature source Oral, resp. rate 18, height 5\' 10"  (1.778 m), weight 84.641 kg (186 lb 9.6 oz), SpO2 97.00%. Physical Exam  Nursing note and vitals reviewed. Constitutional: He appears well-developed.       Slumped to the right with inability to self correct even with cues.   HENT:  Head: Normocephalic and atraumatic.  Eyes: Pupils are equal, round, and reactive to light.  Neck: Normal range of motion.  Cardiovascular: Normal rate and regular rhythm.   Pulmonary/Chest: Effort normal and breath sounds normal.  Abdominal: Soft. Bowel sounds are normal.  Musculoskeletal:       Right foot cold to touch. Right pretibial area cool to touch-non tender.  L-AKA with dry dressing.   Neurological: He is alert.       Oriented to self, age/DOB.  Unable to state date/month even with cues.  Needs redirection. Poor insight. Poor awareness. Follows basic commands without difficulty. Right  sided weakness.   motor strength is 4/5 in bilateral deltoid, biceps, triceps, grip Right lower extremity 1/5 in the hip flexor knee extensor ankle dorsiflexor plantar flexor. Sensation is intact in the upper and lower extremities to light touch There is ataxia on finger nose to finger testing in both upper extremities. Could not assess  in the right lower extremity due to weakness  Results for orders placed during the hospital encounter of 03/11/12 (from the past 24 hour(s))  GLUCOSE, CAPILLARY     Status: Abnormal   Collection Time   03/16/12  4:20 PM      Component Value Range   Glucose-Capillary 177 (*) 70 - 99 mg/dL   Comment 1 Documented in Chart     Comment 2 Notify RN    GLUCOSE, CAPILLARY     Status: Normal   Collection Time   03/16/12  9:27 PM      Component Value Range   Glucose-Capillary 90  70 - 99 mg/dL   Comment 1 Documented in Chart     Comment 2 Notify RN    BASIC METABOLIC PANEL     Status: Abnormal   Collection Time   03/17/12  4:50 AM      Component Value Range   Sodium 140  135 - 145 mEq/L   Potassium 3.7  3.5 - 5.1 mEq/L   Chloride 104  96 - 112 mEq/L   CO2 26  19 - 32 mEq/L   Glucose, Bld 77  70 - 99 mg/dL   BUN 8  6 - 23 mg/dL   Creatinine, Ser 1.61  0.50 - 1.35 mg/dL   Calcium 8.8  8.4 - 09.6 mg/dL   GFR calc non Af Amer 81 (*) >90 mL/min   GFR calc Af Amer >90  >90 mL/min  CBC     Status: Abnormal   Collection Time   03/17/12  4:50 AM      Component Value Range   WBC 9.0  4.0 - 10.5 K/uL   RBC 3.80 (*) 4.22 - 5.81 MIL/uL   Hemoglobin 9.9 (*) 13.0 - 17.0 g/dL   HCT 04.5 (*) 40.9 - 81.1 %   MCV 80.3  78.0 - 100.0 fL   MCH 26.1  26.0 - 34.0 pg   MCHC 32.5  30.0 - 36.0 g/dL   RDW 91.4 (*) 78.2 - 95.6 %   Platelets 300  150 - 400 K/uL  GLUCOSE, CAPILLARY     Status: Normal   Collection Time   03/17/12  6:02 AM      Component Value Range   Glucose-Capillary 79  70 - 99 mg/dL  GLUCOSE, CAPILLARY     Status: Normal   Collection Time   03/17/12 10:43  AM      Component Value Range   Glucose-Capillary 75  70 - 99 mg/dL   No results found.  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 Does the need for close, 24 hr/day medical supervision in concert with the patient's rehab needs make it unreasonable for this patient to be served in a less intensive setting? Yes 2. Co-Morbidities requiring supervision/potential complications: MS, ischemic cardiomyopathy 3. Due to bladder management, bowel management, safety, skin/wound care, disease management, medication administration, pain management and patient education, does the patient require 24 hr/day rehab nursing? Yes 4. Does the patient require coordinated care of a physician, rehab nurse, PT (1-2 hrs/day, 5 days/week), OT (1-2 hrs/day, 5 days/week) and SLP (0.5-1 hrs/day, 5 days/week) to address physical and functional deficits in the context of the above medical diagnosis(es)? Yes Addressing deficits in the following areas: balance, endurance, locomotion, strength, transferring, bowel/bladder control, bathing, dressing, grooming, toileting and cognition 5. Can the patient actively participate in an intensive therapy  program of at least 3 hrs of therapy per day at least 5 days per week? Potentially 6. The potential for patient to make measurable gains while on inpatient rehab is good 7. Anticipated functional outcomes upon discharge from inpatient rehab are minimal assistance mobility with PT, minimal assistance ADLs with OT, orientation to person place and time, express basic needs with SLP. 8. Estimated rehab length of stay to reach the above functional goals is: 2 week 9. Does the patient have adequate social supports to accommodate these discharge functional goals? Yes 10. Anticipated D/C setting: Home 11. Anticipated post D/C treatments: HH therapy 12. Overall Rehab/Functional  Prognosis: good  RECOMMENDATIONS: This patient's condition is appropriate for continued rehabilitative care in the following setting: CIR Patient has agreed to participate in recommended program. Potentially Note that insurance prior authorization may be required for reimbursement for recommended care.  Comment:needs to be off of IV pain medication,and IV pressors. Should be ready for CIR level in a couple  days    03/17/2012

## 2012-03-17 NOTE — Transfer of Care (Signed)
Immediate Anesthesia Transfer of Care Note  Patient: Jaime [name]  Procedure(s) Performed: Procedure(s) (LRB): AMPUTATION ABOVE KNEE (Left)  Patient Location: PACU  Anesthesia Type: General  Level of Consciousness: sedated  Airway & Oxygen Therapy: Patient Spontanous Breathing and Patient connected to face mask oxygen  Post-op Assessment: Report given to PACU RN and Post -op Vital signs reviewed and stable  Post vital signs: Reviewed and stable  Complications: No apparent anesthesia complications

## 2012-03-17 NOTE — Anesthesia Preprocedure Evaluation (Addendum)
Anesthesia Evaluation  Patient identified by MRN, date of birth, ID band Patient awake    Reviewed: Allergy & Precautions, H&P , NPO status , Patient's Chart, lab work & pertinent test results  Airway Mallampati: II      Dental   Pulmonary pneumonia -, resolved,  breath sounds clear to auscultation        Cardiovascular hypertension, + CAD, + Past MI and +CHF Rhythm:Regular Rate:Normal     Neuro/Psych    GI/Hepatic negative GI ROS, Neg liver ROS,   Endo/Other    Renal/GU negative Renal ROS     Musculoskeletal   Abdominal   Peds  Hematology negative hematology ROS (+)   Anesthesia Other Findings   Reproductive/Obstetrics                          Anesthesia Physical Anesthesia Plan  ASA: III  Anesthesia Plan: General   Post-op Pain Management:    Induction: Intravenous  Airway Management Planned: LMA  Additional Equipment:   Intra-op Plan:   Post-operative Plan: Extubation in OR  Informed Consent: I have reviewed the patients History and Physical, chart, labs and discussed the procedure including the risks, benefits and alternatives for the proposed anesthesia with the patient or authorized representative who has indicated his/her understanding and acceptance.     Plan Discussed with: CRNA, Anesthesiologist and Surgeon  Anesthesia Plan Comments:        Anesthesia Quick Evaluation

## 2012-03-17 NOTE — Progress Notes (Signed)
Patient remains confused and restless at times. Attempting to pull at catheter and amputated leg. Mittens applied for safety; family and charge RN aware. Jaime [name]

## 2012-03-17 NOTE — Anesthesia Postprocedure Evaluation (Signed)
  Anesthesia Post-op Note  Patient: Jaime [name]  Procedure(s) Performed: Procedure(s) (LRB): AMPUTATION ABOVE KNEE (Left)  Patient Location: PACU  Anesthesia Type: General  Level of Consciousness: awake  Airway and Oxygen Therapy: Patient Spontanous Breathing  Post-op Pain: mild  Post-op Assessment: Post-op Vital signs reviewed  Post-op Vital Signs: Reviewed  Complications: No apparent anesthesia complications

## 2012-03-17 NOTE — Op Note (Signed)
OPERATIVE NOTE   PROCEDURE: left above-the-knee amputation  PRE-OPERATIVE DIAGNOSIS: left foot dry gangrene  POST-OPERATIVE DIAGNOSIS: same as above  SURGEON: Leonides Sake, MD  ASSISTANT(S): Lianne Cure, PAC   ANESTHESIA: general  ESTIMATED BLOOD LOSS: 100 cc  FINDING(S): 1.  Viable muscle in thigh 2.  Bypass graft thrombosed  SPECIMEN(S):  left above-the-knee amputation  INDICATIONS:   Jaime [name] is a 71 y.o. male who presents with left foot dry gangrene.  This patient has undergone two revascularization procedures this admission.  Unfortunately, his tibial arteries were found to be severely diseased, so I suspect the previous procedures have clotted.  The patient is scheduled for a left transmetatarsal vs. above-the-knee amputation.  I discussed in depth with the patient the risks, benefits, and alternatives to this procedure.  The patient is aware that the risk of this operation included but are not limited to:  bleeding, infection, myocardial infarction, stroke, death, failure to heal amputation wound, and possible need for more proximal amputation.  The patient is aware of the risks and agrees proceed forward with the procedure.  DESCRIPTION: After full informed written consent was obtained from the patient, the patient was brought back to the operating room, and placed supine upon the operating table.  Prior to induction, the patient received IV antibiotics.  I examined the skin on the plantar surface of the left foot and felt the skin appeared ischemic, suggesting that the perfusion in this foot is inadequate for a transmetatarsal amputation, so I decided to proceed with an above-knee amputation.  The patient was then prepped and draped in the standard fashion for an above-the-knee amputation.  After obtaining adequate anesthesia, the patient was prepped and draped in the standard fashion for a above-the-knee amputation.  I placed a sterile tourniquet on the thigh.  I  marked out the anterior and posterior flaps for a fish-mouth type of amputation.  I exsanguinated the leg with an Esmarch bandage and then inflated the tourniquet to 250 mm Hg.   I made the incisions for these flaps, and then dissected through the subcutaneous tissue, fascia, and muscles circumferentially.  I elevated the periosteal tissue 4 cm more proximal than the anterior skin flap.  I then transected the femur with a power saw at this level.  Then I smoothed out the rough edges of the bone with a rasp.  At this point, the specimen was passed off the field as the above-the-knee amputation.  At this point, I clamped all visibly bleeding arteries and veins using a combination of suture ligation with Vicryl suture and electrocautery.  The tourniquet was then deflated at this point.  Bleeding continued to be controlled with electrocautery and suture ligature.  The stump was washed off with sterile normal saline and no further active bleeding was noted.  I reapproximated the anterior and posterior fascia  with interrupted stitches of 2-0 Vicryl.  This was completed along the entire length of anterior and posterior fascia until there were no more loose space in the fascial line.  The skin was then  reapproximated with staples.  The stump was washed off and dried.  The incision was dressed with [employer] and  then fluffs were applied.  Kerlix was wrapped around the leg and then gently an ACE wrap was applied.    COMPLICATIONS: none  CONDITION: stable   Leonides Sake, MD [facility] [place] Office: [phone] Pager: [phone]  03/17/2012, 10:25 AM

## 2012-03-18 DIAGNOSIS — I739 Peripheral vascular disease, unspecified: Secondary | ICD-10-CM

## 2012-03-18 DIAGNOSIS — S78119A Complete traumatic amputation at level between unspecified hip and knee, initial encounter: Secondary | ICD-10-CM | POA: Diagnosis not present

## 2012-03-18 DIAGNOSIS — L98499 Non-pressure chronic ulcer of skin of other sites with unspecified severity: Secondary | ICD-10-CM | POA: Diagnosis not present

## 2012-03-18 LAB — CBC
Hemoglobin: 9.3 g/dL — ABNORMAL LOW (ref 13.0–17.0)
MCHC: 32.2 g/dL (ref 30.0–36.0)
RDW: 16 % — ABNORMAL HIGH (ref 11.5–15.5)
WBC: 15.1 10*3/uL — ABNORMAL HIGH (ref 4.0–10.5)

## 2012-03-18 LAB — BASIC METABOLIC PANEL
BUN: 11 mg/dL (ref 6–23)
Creatinine, Ser: 0.92 mg/dL (ref 0.50–1.35)
GFR calc Af Amer: >90 mL/min (ref >90)
GFR calc non Af Amer: 83 mL/min — ABNORMAL LOW (ref >90)
Potassium: 4 mEq/L (ref 3.5–5.1)

## 2012-03-18 LAB — GLUCOSE, CAPILLARY: Glucose-Capillary: 180 mg/dL — ABNORMAL HIGH (ref 70–99)

## 2012-03-18 NOTE — Progress Notes (Signed)
I met with pt at bedside and contacted his wife by phone. Patient uses a trapeze bar to scoot into his power wheelchair at home. Has a hospital bed and is easier to get out of bed than back into it. Has not stood for years. Upper body strength has been good. His wife prefers inpt rehab rather than SNF and we can admit pt as early as tomorrow. We will follow up in the morning. Please call for any questions. [phone]

## 2012-03-18 NOTE — Progress Notes (Signed)
Physical Therapy Treatment Patient Details Name: Jaime [name] MRN: [id number] DOB: August 18, 1940 Today's Date: 03/18/2012 Time: 4098-1191 PT Time Calculation (min): 13 min  PT Assessment / Plan / Recommendation Comments on Treatment Session  Pt adm with LLE bypass and then underwent Lt AKA on 03/17/12.  Pt also with MS which has left him w/c bound.  Pt will need mechanical lift at home regardless of extent of rehab prior to dc home.  Pt doesn't have the trunk strength or control to attempt sliding board transfer.    Follow Up Recommendations  Home health PT;Supervision/Assistance - 24 hour    Barriers to Discharge        Equipment Recommendations  Hospital bed;Other (comment) (mechanical lift.)    Recommendations for Other Services    Frequency Min 3X/week   Plan Discharge plan remains appropriate;Frequency remains appropriate    Precautions / Restrictions Precautions Precautions: Fall   Pertinent Vitals/Pain Pain controlled    Mobility  Bed Mobility Supine to Sit: 1: +2 Total assist Supine to Sit: Patient Percentage: 40% Sitting - Scoot to Edge of Bed: 1: +2 Total assist Sitting - Scoot to Edge of Bed: Patient Percentage: 50% Sit to Supine: 1: +2 Total assist Sit to Supine: Patient Percentage: 20% Details for Bed Mobility Assistance: Pt using arms to help pull trunk up    Exercises     PT Diagnosis:    PT Problem List:   PT Treatment Interventions:     PT Goals Acute Rehab PT Goals PT Goal: Supine/Side to Sit - Progress: Progressing toward goal PT Goal: Sit to Supine/Side - Progress: Progressing toward goal PT Transfer Goal: Bed to Chair/Chair to Bed - Progress: Discontinued (comment) (pt needs mechanical lift.) Additional Goals PT Goal: Additional Goal #1 - Progress: Not progressing  Visit Information  Last PT Received On: 03/18/12 Assistance Needed: +2    Subjective Data  Subjective: Pt reports he has a hard time breathing sitting EOB due to scoliosis     Cognition  Overall Cognitive Status: History of cognitive impairments - further impaired Arousal/Alertness: Awake/alert Behavior During Session: [facility] for tasks performed Current Attention Level: Sustained    Balance  Static Sitting Balance Static Sitting - Balance Support: Bilateral upper extremity supported;Feet supported Static Sitting - Level of Assistance: 3: Mod assist Static Sitting - Comment/# of Minutes: Pt collapsing to rt resulting in incr work of breathing.  Sat EOB x 5 minutes.  End of Session PT - End of Session Activity Tolerance: Patient limited by fatigue Patient left: in bed;with call bell/phone within reach;with bed alarm set   GP     [facility] 03/18/2012, 11:23 AM  [facility] PT [phone]

## 2012-03-18 NOTE — PMR Pre-admission (Signed)
PMR Admission Coordinator Pre-Admission Assessment  Patient: Jaime [name] is an 71 y.o., male MRN: [id number] DOB: 06-Nov-1940 Height: 5\' 10"  (177.8 cm) Weight: 84.641 kg (186 lb 9.6 oz)  Insurance Information HMO:     PPO:      PCP:      IPA:      80/20: yes     OTHER: no hmo PRIMARY: Medicare a and b      Policy#: [id number]      Subscriber: pt CM Name:       Phone#:      Fax#:  Pre-Cert#:       Employer:  Benefits:  Phone #: vision share     Name: 03/18/12 Eff. Date: a 06/22/95 b 01/19/97     Deduct: $1184      Out of Pocket Max: none      Life Max: none CIR: 100%      SNF: 20 full days LBD 09/04/06 Outpatient: 80%     Co-Pay: 20% Home Health: 100%      Co-Pay: none DME: 80%     Co-Pay: 20% Providers: pt choice  SECONDARY: [employer]      Policy#: [id number]      Subscriber: pt No approval needed  THIRD: Tricare for life policy [id number] no approval required   Emergency Contact Information Contact Information    Name Relation Home Work Mobile   Moorer,[name] Spouse [phone]  [phone]     Current Medical History  Patient Admitting Diagnosis: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 male with history of MS with paraplegia, DM type 2, PVD with ischemic right foot and rest pain. Admitted on 03/11/12 for left CFA to tibioperoneal trunk bypass by Dr. Imogene Burn. Post op with loss of pulse due to acute thrombosis of tibioperoneal trunk with concomitant thrombosis of distal femorotibioperoneal bypass. He was taken back to OR for thrombectomy late that pm but with minimal perfusion of foot. L- AKA advised and patient may need R-AKA int he future if symptomatic. Patient agreeable to procedure and underwent L-AKA on 03/17/12. Has had confusion overnight. MD, family requesting CIR for progression.  Past Medical History  Past Medical  History  Diagnosis Date  . Hyperlipidemia   . Cardiomyopathy, ischemic     EF 45% per ECHO 2008  . Pulmonary hypertension     moderate ECHO Jan 2008  . CHF (congestive heart failure)   . Systolic heart failure   . Multiple sclerosis DX 1993    NEUROLOGIST-  DR DOHMEIER -- LAST VISIT 11-20-2010  NOTE W/ CHART  . Lung nodule     resolved 11-2006 CT Chest  . Tobacco abuse   . Increased prostate specific antigen (PSA) velocity   . Urinary retention     dx ~ 2-12, like from MS, now with a catheter, saw urology  . Ejection fraction      EF 45%, echo, 2008  . Cellulitis   . Pre-syncope     Near syncope, ER visit, October, 2011  . Carotid artery disease     Doppler, February, 2012, 0-39% bilateral,Mild smooth plaque  . Chronic indwelling foley catheter   . Scoliosis associated with other condition   . Hemiparesis   . Fatigue SEVERE  . Weakness   . Impotence   . MI, acute, non ST segment elevation 2007  S/P PCI WITH X1 STENT (TAXUS DRUG-ELUTING) LEFT CIRCUMFLEX  . Echocardiogram abnormal 08-17-2006    SEVERE HYPOKINESIS OF INFERIOR , POSTEROR WALLS OF BASE AND MID VENTRICLE AND MOD. PULMONARY HTN  . Diabetes mellitus     INSULIN-DEPENDANT  . Hypertension   . H/O pleural effusion 2008    POST THORACENTESIS  . History of colon polyps PRECANCEROUS  . Insomnia   . Ingrown right big toenail CHRONIC    PER PT VERY SORE  . CAD (coronary artery disease) CARDIOLOGIST- DR KATZ-- VISIT 06-05-2011 IN [facility]    non-STEMI, 2007.Marland Kitchenoccluded circumflex.. Taxus stent placed...residual 80% LAD...50% RCA  . Pneumonia     hx of  . Urinary tract infection     hx of  . Neuromuscular disorder     multiple sclerosis  . Atherosclerotic PVD with ulceration     left foot    Family History  family history includes COPD in his father; Diabetes in his brother and daughter; Heart attack in his brother; Heart attack (age of onset:83) in his mother; Heart disease in his brother and mother; Hyperlipidemia  in his mother; Hypertension in his brother, mother, and unspecified family member; Peripheral vascular disease in his father; and Stroke in his mother.  There is no history of Colon cancer and Prostate cancer.  Prior Rehab/Hospitalizations: home health only  Current Medications  Current facility-administered medications:0.9 %  sodium chloride infusion, , Intravenous, PRN, Fransisco Hertz, MD, Last Rate: 20 mL/hr at 03/13/12 1541;  0.9 %  sodium chloride infusion, , Intravenous, Continuous, Fransisco Hertz, MD, Last Rate: 75 mL/hr at 03/17/12 1312;  acetaminophen (TYLENOL) suppository 325-650 mg, 325-650 mg, Rectal, Q4H PRN, Fransisco Hertz, MD;  acetaminophen (TYLENOL) tablet 325-650 mg, 325-650 mg, Oral, Q4H PRN, Fransisco Hertz, MD alum & mag hydroxide-simeth (MAALOX/MYLANTA) 200-200-20 MG/5ML suspension 15-30 mL, 15-30 mL, Oral, Q2H PRN, Fransisco Hertz, MD;  amantadine (SYMMETREL) capsule 200 mg, 200 mg, Oral, TID, Fransisco Hertz, MD, 200 mg at 03/18/12 1707;  aspirin EC tablet 81 mg, 81 mg, Oral, Daily, Fransisco Hertz, MD, 81 mg at 03/18/12 1005;  bisacodyl (DULCOLAX) suppository 10 mg, 10 mg, Rectal, Daily PRN, Fransisco Hertz, MD dextrose (GLUTOSE) 40 % oral gel 37.5 g, 1 Tube, Oral, PRN, Fransisco Hertz, MD, 37.5 g at 03/16/12 1610;  docusate sodium (COLACE) capsule 100 mg, 100 mg, Oral, Daily, Fransisco Hertz, MD, 100 mg at 03/18/12 1005;  DOPamine (INTROPIN) 800 mg in dextrose 5 % 250 mL infusion, 3-5 mcg/kg/min, Intravenous, Continuous, Fransisco Hertz, MD;  enoxaparin (LOVENOX) injection 40 mg, 40 mg, Subcutaneous, Q24H, Fransisco Hertz, MD, 40 mg at 03/18/12 1303 gabapentin (NEURONTIN) capsule 300 mg, 300 mg, Oral, TID, Fransisco Hertz, MD, 300 mg at 03/18/12 1707;  guaiFENesin-dextromethorphan (ROBITUSSIN DM) 100-10 MG/5ML syrup 15 mL, 15 mL, Oral, Q4H PRN, Fransisco Hertz, MD, 15 mL at 03/13/12 0228;  haloperidol lactate (HALDOL) injection 1 mg, 1 mg, Intravenous, Q2H PRN, Fransisco Hertz, MD;  hydrALAZINE (APRESOLINE) injection 10 mg,  10 mg, Intravenous, Q2H PRN, Fransisco Hertz, MD, 10 mg at 03/17/12 2354 insulin aspart (novoLOG) injection 0-15 Units, 0-15 Units, Subcutaneous, TID WC, Fransisco Hertz, MD, 3 Units at 03/18/12 [phone];  insulin aspart protamine-insulin aspart (NOVOLOG 70/30) injection 10 Units, 10 Units, Subcutaneous, Q supper, Fransisco Hertz, MD, 10 Units at 03/16/12 1754;  insulin aspart protamine-insulin aspart (NOVOLOG 70/30) injection 30 Units, 30 Units, Subcutaneous, Q breakfast, Fransisco Hertz, MD,  30 Units at 03/18/12 0852 labetalol (NORMODYNE,TRANDATE) injection 10 mg, 10 mg, Intravenous, Q2H PRN, Fransisco Hertz, MD;  metoprolol (LOPRESSOR) injection 2-5 mg, 2-5 mg, Intravenous, Q2H PRN, Fransisco Hertz, MD;  metoprolol succinate (TOPROL-XL) 24 hr tablet 25 mg, 25 mg, Oral, Daily, Fransisco Hertz, MD, 25 mg at 03/18/12 1005;  morphine 2 MG/ML injection 2-5 mg, 2-5 mg, Intravenous, Q1H PRN, Fransisco Hertz, MD, 2 mg at 03/18/12 0434 ondansetron [facility]) injection 4 mg, 4 mg, Intravenous, Q6H PRN, Fransisco Hertz, MD;  oxyCODONE (Oxy IR/ROXICODONE) immediate release tablet 5-10 mg, 5-10 mg, Oral, Q4H PRN, Fransisco Hertz, MD, 10 mg at 03/18/12 1258;  pantoprazole (PROTONIX) EC tablet 40 mg, 40 mg, Oral, Q1200, Fransisco Hertz, MD, 40 mg at 03/18/12 1225;  phenol (CHLORASEPTIC) mouth spray 1 spray, 1 spray, Mouth/Throat, PRN, Fransisco Hertz, MD senna-docusate (Senokot-S) tablet 1 tablet, 1 tablet, Oral, QHS PRN, Fransisco Hertz, MD, 1 tablet at 03/14/12 2138;  simvastatin (ZOCOR) tablet 40 mg, 40 mg, Oral, QHS, Fransisco Hertz, MD, 40 mg at 03/17/12 2115;  sodium phosphate (FLEET) 7-19 GM/118ML enema 1 enema, 1 enema, Rectal, Once PRN, Fransisco Hertz, MD;  DISCONTD: piperacillin-tazobactam (ZOSYN) IVPB 3.375 g, 3.375 g, Intravenous, Q8H, Tsz-Yin So, PHARMD, 3.375 g at 03/17/12 2347 DISCONTD: vancomycin (VANCOCIN) 750 mg in sodium chloride 0.9 % 150 mL IVPB, 750 mg, Intravenous, Q12H, Tsz-Yin So, PHARMD, 750 mg at 03/18/12 0865  Patients Current Diet: Carb  Control  Precautions / Restrictions Precautions Precautions: Fall Restrictions Weight Bearing Restrictions: No LLE Weight Bearing: Weight bearing as tolerated   Prior Activity Level Community (5-7x/wk): uses power w/c with handicapped Zenaida Niece and in community with wife frequently, loves Programmer, applications / Equipment Home Assistive Devices/Equipment: Environmental consultant (specify type) Home Adaptive Equipment: Wheelchair - powered;Hospital bed;Other (comment);Art gallery manager  Prior Functional Level Prior Function Level of Independence:  (transfers to power w/c using trapeze bar and from hospital b) Needs Assistance: Bathing;Dressing;Feeding;Grooming;Toileting;Meal Prep;Light Housekeeping Bath: Maximal Dressing: Maximal Feeding: Minimal Grooming: Minimal Toileting:  (wears diapers) Meal Prep: Total Light Housekeeping: Total Able to Take Stairs?: No Driving:  (wife drives the handicapped Zenaida Niece) Vocation:  (retired Hotel manager for 20 years ) Comments: likes to watch tv, go to [employer] and listen to his CDS  Wife places a garbage bag under a pad when pt gets in bed. This assists pt with sliding up in his hospital bed with use of trapeze. She then removes it. She is very open to learn new techniques to manage his mobility. He has not stood since 1995. Transfers only. Has not used a slideboard technique.  Current Functional Level Cognition  Arousal/Alertness: Awake/alert Overall Cognitive Status:  (at baseline dementia issues per his wife. Not at baseline) Current Attention Level: Sustained Memory Deficits: pt with decreased recal of home set-up and PLOF.   Orientation Level: Oriented to place;Oriented to person Safety/Judgement: Decreased safety judgement for tasks assessed;Impulsive;Decreased awareness of need for assistance Awareness of Errors: Assistance required to identify errors made;Assistance required to correct errors made Awareness of Deficits: pt impulsive and thinks he is able  to do more than he really can.   Cognition - Other Comments: Per wife pt tries to get himself into power chair on his own and often needs help to prevent falling  or at times has fallen and needs A back to bed.      Extremity Assessment (includes Sensation/Coordination)     RLE ROM/Strength/Tone: Deficits RLE ROM/Strength/Tone Deficits: PROM WFL,  only activie motion is hip hike RLE Sensation: Deficits RLE Sensation Deficits: pt with no soft touch sensation in R LE    ADLs       Mobility  Bed Mobility: Supine to Sit;Sitting - Scoot to Edge of Bed Supine to Sit: 1: +2 Total assist Supine to Sit: Patient Percentage: 40% Sitting - Scoot to Edge of Bed: 1: +2 Total assist Sitting - Scoot to Edge of Bed: Patient Percentage: 50% Sit to Supine: 1: +2 Total assist Sit to Supine: Patient Percentage: 20%    Transfers  Transfers: Not assessed (pt declined wanting to get OOB) Squat Pivot Transfers: 1: +2 Total assist;From elevated surface Squat Pivot Transfers: Patient Percentage: 10%    Ambulation / Gait / Stairs / Wheelchair Mobility  Ambulation/Gait Ambulation/Gait Assistance: Not tested (comment) (not applicable) Stairs: No Wheelchair Mobility Wheelchair Mobility: No    Posture / Games developer Sitting - Balance Support: Bilateral upper extremity supported;Feet supported Static Sitting - Level of Assistance: 3: Mod assist Static Sitting - Comment/# of Minutes: Pt collapsing to rt resulting in incr work of breathing.  Sat EOB x 5 minutes.     Previous Home Environment Living Arrangements: Spouse/significant other Lives With: Spouse Available Help at Discharge: Family;Available 24 hours/day Type of Home: House Home Layout: One level Home Access: Ramped entrance Bathroom Toilet:  (does not use toliet or BSC, diaper) Home Care Services: No Additional Comments:  (pt moved form [place] 8 yrs ago, was seeing MS MD at [facility])  Discharge Living Setting Plans  for Discharge Living Setting: Patient's home;Lives with (comment) (wife) Type of Home at Discharge: House Discharge Home Layout: One level Discharge Home Access: Ramped entrance Discharge Bathroom Shower/Tub:  (does not use BSC or bathroom; diaper) Do you have any problems obtaining your medications?: No  Social/Family/Support Systems Patient Roles: Spouse;Parent Contact Information: Che Rachal, wife Anticipated Caregiver: wife, daughter in [place], but wife and pt do everytning Anticipated Caregiver's Contact Information: see above Ability/Limitations of Caregiver: min assist Caregiver Availability: 24/7 Discharge Plan Discussed with Primary Caregiver: Yes Is Caregiver In Agreement with Plan?: Yes Does Caregiver/Family have Issues with Lodging/Transportation while Pt is in Rehab?: No  Goals/Additional Needs Patient/Family Goal for Rehab: min assist w/c transfers, max assist OT Expected length of stay: ELOS 2 weeks Additional Information: patient and wife have never been inpt rehab. Both very open to learn new ways of functioning with his advance MS. Saw [facility] the first time in 6 yrs recently. Pt/Family Agrees to Admission and willing to participate: Yes Program Orientation Provided & Reviewed with Pt/Caregiver Including Roles  & Responsibilities: Yes  Patient Condition: This patient's condition remains as documented in the Consult dated 03/18/12, in which the Rehabilitation Physician determined and documented that the patient's condition is appropriate for intensive rehabilitative care in an inpatient rehabilitation facility.  Preadmission Screen Completed By:  Clois Dupes, 03/18/2012 6:27 PM ______________________________________________________________________   Discussed status with Dr. Wynn Banker on 03/19/12 at 11:48 and received telephone approval for admission today.  Admission Coordinator:  Clois Dupes, [id number] Dorna Bloom 03/19/12

## 2012-03-18 NOTE — Progress Notes (Signed)
[facility]  Daily Progress Note  Assessment/Planning: POD #1 s/p L AKA; POD #7 s/p L TPT & PT EA w/ BPA, L fem-pop   Confused overnight: expect this to clear, as this was the pattern after his previous operation  PT & OT eval, followed by PMR eval  D/C abx and MIVF  Subjective  - 1 Day Post-Op  Pain controlled  Objective Filed Vitals:   03/17/12 1348 03/17/12 2129 03/17/12 2300 03/18/12 0501  BP: 147/75 178/96 174/104 130/83  Pulse: 70 96  73  Temp: 99 F (37.2 C) 98.2 F (36.8 C)  98.9 F (37.2 C)  TempSrc:  Oral  Oral  Resp: 18 20  22   Height:      Weight:      SpO2: 97% 95%  92%    Intake/Output Summary (Last 24 hours) at 03/18/12 0758 Last data filed at 03/18/12 0555  Gross per 24 hour  Intake   1560 ml  Output   2300 ml  Net   -740 ml    PULM  BLL rales CV  RRR GI  soft, NTND VASC  L AKA dressed without active bleeding evident  Laboratory CBC    Component Value Date/Time   WBC 15.1* 03/18/2012 0600   HGB 9.3* 03/18/2012 0600   HCT 28.9* 03/18/2012 0600   PLT 317 03/18/2012 0600    BMET    Component Value Date/Time   NA 133* 03/18/2012 0600   K 4.0 03/18/2012 0600   CL 98 03/18/2012 0600   CO2 23 03/18/2012 0600   GLUCOSE 241* 03/18/2012 0600   GLUCOSE 110* 05/12/2006 1139   BUN 11 03/18/2012 0600   CREATININE 0.92 03/18/2012 0600   CALCIUM 8.5 03/18/2012 0600   GFRNONAA 83* 03/18/2012 0600   GFRAA >90 03/18/2012 0600    Leonides Sake, MD [facility] Office: [phone] Pager: [phone]  03/18/2012, 7:58 AM

## 2012-03-19 ENCOUNTER — Inpatient Hospital Stay (HOSPITAL_COMMUNITY)
Admission: RE | Admit: 2012-03-19 | Discharge: 2012-03-31 | DRG: 945 | Disposition: A | Discharge status: Home Health | Payer: Medicare Other | Source: Ambulatory Visit | Attending: Physical Medicine & Rehabilitation | Admitting: Physical Medicine & Rehabilitation

## 2012-03-19 ENCOUNTER — Telehealth: Payer: Self-pay | Admitting: Vascular Surgery

## 2012-03-19 ENCOUNTER — Encounter (HOSPITAL_COMMUNITY): Payer: Self-pay | Admitting: Vascular Surgery

## 2012-03-19 DIAGNOSIS — I2789 Other specified pulmonary heart diseases: Secondary | ICD-10-CM | POA: Diagnosis not present

## 2012-03-19 DIAGNOSIS — G35 Multiple sclerosis: Secondary | ICD-10-CM | POA: Diagnosis not present

## 2012-03-19 DIAGNOSIS — I5022 Chronic systolic (congestive) heart failure: Secondary | ICD-10-CM | POA: Diagnosis not present

## 2012-03-19 DIAGNOSIS — L97509 Non-pressure chronic ulcer of other part of unspecified foot with unspecified severity: Secondary | ICD-10-CM | POA: Diagnosis not present

## 2012-03-19 DIAGNOSIS — F29 Unspecified psychosis not due to a substance or known physiological condition: Secondary | ICD-10-CM | POA: Diagnosis not present

## 2012-03-19 DIAGNOSIS — L98499 Non-pressure chronic ulcer of skin of other sites with unspecified severity: Secondary | ICD-10-CM | POA: Diagnosis not present

## 2012-03-19 DIAGNOSIS — I252 Old myocardial infarction: Secondary | ICD-10-CM

## 2012-03-19 DIAGNOSIS — F329 Major depressive disorder, single episode, unspecified: Secondary | ICD-10-CM

## 2012-03-19 DIAGNOSIS — N319 Neuromuscular dysfunction of bladder, unspecified: Secondary | ICD-10-CM

## 2012-03-19 DIAGNOSIS — Z9861 Coronary angioplasty status: Secondary | ICD-10-CM | POA: Diagnosis not present

## 2012-03-19 DIAGNOSIS — F17200 Nicotine dependence, unspecified, uncomplicated: Secondary | ICD-10-CM

## 2012-03-19 DIAGNOSIS — N390 Urinary tract infection, site not specified: Secondary | ICD-10-CM | POA: Diagnosis not present

## 2012-03-19 DIAGNOSIS — I2510 Atherosclerotic heart disease of native coronary artery without angina pectoris: Secondary | ICD-10-CM | POA: Diagnosis present

## 2012-03-19 DIAGNOSIS — E119 Type 2 diabetes mellitus without complications: Secondary | ICD-10-CM | POA: Diagnosis present

## 2012-03-19 DIAGNOSIS — Z794 Long term (current) use of insulin: Secondary | ICD-10-CM

## 2012-03-19 DIAGNOSIS — Z5189 Encounter for other specified aftercare: Principal | ICD-10-CM

## 2012-03-19 DIAGNOSIS — G547 Phantom limb syndrome without pain: Secondary | ICD-10-CM

## 2012-03-19 DIAGNOSIS — T398X5A Adverse effect of other nonopioid analgesics and antipyretics, not elsewhere classified, initial encounter: Secondary | ICD-10-CM

## 2012-03-19 DIAGNOSIS — I743 Embolism and thrombosis of arteries of the lower extremities: Secondary | ICD-10-CM

## 2012-03-19 DIAGNOSIS — I2589 Other forms of chronic ischemic heart disease: Secondary | ICD-10-CM | POA: Diagnosis not present

## 2012-03-19 DIAGNOSIS — F09 Unspecified mental disorder due to known physiological condition: Secondary | ICD-10-CM

## 2012-03-19 DIAGNOSIS — I509 Heart failure, unspecified: Secondary | ICD-10-CM

## 2012-03-19 DIAGNOSIS — I6529 Occlusion and stenosis of unspecified carotid artery: Secondary | ICD-10-CM | POA: Diagnosis not present

## 2012-03-19 DIAGNOSIS — N289 Disorder of kidney and ureter, unspecified: Secondary | ICD-10-CM

## 2012-03-19 DIAGNOSIS — Z79899 Other long term (current) drug therapy: Secondary | ICD-10-CM | POA: Diagnosis not present

## 2012-03-19 DIAGNOSIS — G319 Degenerative disease of nervous system, unspecified: Secondary | ICD-10-CM | POA: Diagnosis not present

## 2012-03-19 DIAGNOSIS — K592 Neurogenic bowel, not elsewhere classified: Secondary | ICD-10-CM | POA: Diagnosis not present

## 2012-03-19 DIAGNOSIS — Y9210 Unspecified residential institution as the place of occurrence of the external cause: Secondary | ICD-10-CM

## 2012-03-19 DIAGNOSIS — G8220 Paraplegia, unspecified: Secondary | ICD-10-CM | POA: Diagnosis not present

## 2012-03-19 DIAGNOSIS — S78119A Complete traumatic amputation at level between unspecified hip and knee, initial encounter: Secondary | ICD-10-CM | POA: Diagnosis not present

## 2012-03-19 DIAGNOSIS — E1165 Type 2 diabetes mellitus with hyperglycemia: Secondary | ICD-10-CM

## 2012-03-19 DIAGNOSIS — R4182 Altered mental status, unspecified: Secondary | ICD-10-CM

## 2012-03-19 DIAGNOSIS — I10 Essential (primary) hypertension: Secondary | ICD-10-CM | POA: Diagnosis not present

## 2012-03-19 DIAGNOSIS — Z7982 Long term (current) use of aspirin: Secondary | ICD-10-CM

## 2012-03-19 DIAGNOSIS — I658 Occlusion and stenosis of other precerebral arteries: Secondary | ICD-10-CM | POA: Diagnosis not present

## 2012-03-19 DIAGNOSIS — E785 Hyperlipidemia, unspecified: Secondary | ICD-10-CM | POA: Diagnosis not present

## 2012-03-19 DIAGNOSIS — F3289 Other specified depressive episodes: Secondary | ICD-10-CM

## 2012-03-19 DIAGNOSIS — I739 Peripheral vascular disease, unspecified: Secondary | ICD-10-CM

## 2012-03-19 DIAGNOSIS — IMO0002 Reserved for concepts with insufficient information to code with codable children: Secondary | ICD-10-CM

## 2012-03-19 LAB — GLUCOSE, CAPILLARY
Glucose-Capillary: 51 mg/dL — ABNORMAL LOW (ref 70–99)
Glucose-Capillary: 67 mg/dL — ABNORMAL LOW (ref 70–99)
Glucose-Capillary: 81 mg/dL (ref 70–99)

## 2012-03-19 LAB — BASIC METABOLIC PANEL
CO2: 24 mEq/L (ref 19–32)
Calcium: 8.6 mg/dL (ref 8.4–10.5)
Chloride: 103 mEq/L (ref 96–112)
Creatinine, Ser: 0.92 mg/dL (ref 0.50–1.35)
GFR calc non Af Amer: 83 mL/min — ABNORMAL LOW (ref >90)
Potassium: 3.7 mEq/L (ref 3.5–5.1)
Sodium: 138 mEq/L (ref 135–145)

## 2012-03-19 LAB — URINALYSIS, ROUTINE W REFLEX MICROSCOPIC
Hgb urine dipstick: NEGATIVE
Protein, ur: NEGATIVE mg/dL
Urobilinogen, UA: 2 mg/dL — ABNORMAL HIGH (ref 0.0–1.0)

## 2012-03-19 LAB — CBC
MCH: 25.9 pg — ABNORMAL LOW (ref 26.0–34.0)
MCHC: 32.4 g/dL (ref 30.0–36.0)
Platelets: 334 10*3/uL (ref 150–400)
RDW: 16.1 % — ABNORMAL HIGH (ref 11.5–15.5)

## 2012-03-19 LAB — URINE MICROSCOPIC-ADD ON

## 2012-03-19 MED ORDER — INSULIN ASPART PROT & ASPART (70-30 MIX) 100 UNIT/ML ~~LOC~~ SUSP
20.0000 [IU] | Freq: Every day | SUBCUTANEOUS | Status: DC
  Administered 2012-03-20 – 2012-03-21 (×2): 20 [IU] via SUBCUTANEOUS

## 2012-03-19 MED ORDER — BISACODYL 10 MG RE SUPP
10.0000 mg | Freq: Every day | RECTAL | Status: DC | PRN
  Filled 2012-03-19: qty 1

## 2012-03-19 MED ORDER — PANTOPRAZOLE SODIUM 40 MG PO TBEC
40.0000 mg | DELAYED_RELEASE_TABLET | Freq: Every day | ORAL | Status: DC
  Administered 2012-03-20 – 2012-03-30 (×11): 40 mg via ORAL
  Filled 2012-03-19 (×10): qty 1

## 2012-03-19 MED ORDER — PROCHLORPERAZINE EDISYLATE 5 MG/ML IJ SOLN
5.0000 mg | Freq: Four times a day (QID) | INTRAMUSCULAR | Status: DC | PRN
  Filled 2012-03-19: qty 2

## 2012-03-19 MED ORDER — INSULIN ASPART PROT & ASPART (70-30 MIX) 100 UNIT/ML ~~LOC~~ SUSP
10.0000 [IU] | Freq: Every day | SUBCUTANEOUS | Status: DC
  Administered 2012-03-20 – 2012-03-23 (×4): 10 [IU] via SUBCUTANEOUS

## 2012-03-19 MED ORDER — PROCHLORPERAZINE MALEATE 5 MG PO TABS
5.0000 mg | ORAL_TABLET | Freq: Four times a day (QID) | ORAL | Status: DC | PRN
  Filled 2012-03-19: qty 2

## 2012-03-19 MED ORDER — SIMVASTATIN 40 MG PO TABS
40.0000 mg | ORAL_TABLET | Freq: Every day | ORAL | Status: DC
  Administered 2012-03-19 – 2012-03-30 (×12): 40 mg via ORAL
  Filled 2012-03-19 (×13): qty 1

## 2012-03-19 MED ORDER — DIPHENHYDRAMINE HCL 12.5 MG/5ML PO ELIX: 12.5000 mg | ORAL_SOLUTION | Freq: Four times a day (QID) | ORAL | Status: DC | PRN

## 2012-03-19 MED ORDER — ASPIRIN EC 81 MG PO TBEC
81.0000 mg | DELAYED_RELEASE_TABLET | Freq: Every day | ORAL | Status: DC
  Administered 2012-03-20 – 2012-03-31 (×12): 81 mg via ORAL
  Filled 2012-03-19 (×14): qty 1

## 2012-03-19 MED ORDER — PHENOL 1.4 % MT LIQD
1.0000 | OROMUCOSAL | Status: DC | PRN
  Filled 2012-03-19: qty 177

## 2012-03-19 MED ORDER — ACETAMINOPHEN 325 MG PO TABS
325.0000 mg | ORAL_TABLET | ORAL | Status: DC | PRN
  Administered 2012-03-29: 650 mg via ORAL
  Filled 2012-03-19: qty 2

## 2012-03-19 MED ORDER — METHOCARBAMOL 500 MG PO TABS
500.0000 mg | ORAL_TABLET | Freq: Four times a day (QID) | ORAL | Status: DC | PRN
  Administered 2012-03-24 – 2012-03-25 (×2): 500 mg via ORAL
  Filled 2012-03-19 (×2): qty 1

## 2012-03-19 MED ORDER — ALUM & MAG HYDROXIDE-SIMETH 200-200-20 MG/5ML PO SUSP: 15.0000 mL | ORAL | Status: DC | PRN

## 2012-03-19 MED ORDER — TRAZODONE HCL 50 MG PO TABS
25.0000 mg | ORAL_TABLET | Freq: Every evening | ORAL | Status: DC | PRN
  Administered 2012-03-29: 50 mg via ORAL
  Filled 2012-03-19: qty 1

## 2012-03-19 MED ORDER — OXYCODONE HCL 5 MG PO TABS
5.0000 mg | ORAL_TABLET | ORAL | Status: DC | PRN
  Administered 2012-03-19: 10 mg via ORAL
  Administered 2012-03-19 – 2012-03-20 (×2): 5 mg via ORAL
  Filled 2012-03-19 (×2): qty 1
  Filled 2012-03-19: qty 2

## 2012-03-19 MED ORDER — FLEET ENEMA 7-19 GM/118ML RE ENEM: 1.0000 | ENEMA | Freq: Once | RECTAL | Status: AC | PRN

## 2012-03-19 MED ORDER — PROCHLORPERAZINE 25 MG RE SUPP
12.5000 mg | Freq: Four times a day (QID) | RECTAL | Status: DC | PRN
  Filled 2012-03-19: qty 1

## 2012-03-19 MED ORDER — INSULIN ASPART PROT & ASPART (70-30 MIX) 100 UNIT/ML ~~LOC~~ SUSP
30.0000 [IU] | Freq: Every day | SUBCUTANEOUS | Status: DC
  Filled 2012-03-19: qty 3

## 2012-03-19 MED ORDER — INSULIN ASPART PROT & ASPART (70-30 MIX) 100 UNIT/ML ~~LOC~~ SUSP
10.0000 [IU] | Freq: Every day | SUBCUTANEOUS | Status: DC
  Filled 2012-03-19: qty 3

## 2012-03-19 MED ORDER — SENNOSIDES-DOCUSATE SODIUM 8.6-50 MG PO TABS
1.0000 | ORAL_TABLET | Freq: Every evening | ORAL | Status: DC | PRN
  Administered 2012-03-28: 1 via ORAL

## 2012-03-19 MED ORDER — GABAPENTIN 300 MG PO CAPS
300.0000 mg | ORAL_CAPSULE | Freq: Three times a day (TID) | ORAL | Status: DC
  Administered 2012-03-19 – 2012-03-28 (×27): 300 mg via ORAL
  Filled 2012-03-19 (×34): qty 1

## 2012-03-19 MED ORDER — DOCUSATE SODIUM 100 MG PO CAPS
100.0000 mg | ORAL_CAPSULE | Freq: Every day | ORAL | Status: DC
  Administered 2012-03-20 – 2012-03-31 (×12): 100 mg via ORAL
  Filled 2012-03-19 (×14): qty 1

## 2012-03-19 MED ORDER — AMANTADINE HCL 100 MG PO CAPS
200.0000 mg | ORAL_CAPSULE | Freq: Three times a day (TID) | ORAL | Status: DC
  Administered 2012-03-19 – 2012-03-25 (×19): 200 mg via ORAL
  Filled 2012-03-19 (×24): qty 2

## 2012-03-19 MED ORDER — METOPROLOL SUCCINATE ER 25 MG PO TB24
25.0000 mg | ORAL_TABLET | Freq: Every day | ORAL | Status: DC
  Administered 2012-03-20 – 2012-03-27 (×8): 25 mg via ORAL
  Filled 2012-03-19 (×11): qty 1

## 2012-03-19 MED ORDER — GUAIFENESIN-DM 100-10 MG/5ML PO SYRP: 15.0000 mL | ORAL_SOLUTION | ORAL | Status: DC | PRN

## 2012-03-19 MED ORDER — HYDROCODONE-ACETAMINOPHEN 5-325 MG PO TABS
1.0000 | ORAL_TABLET | ORAL | Status: DC | PRN
  Administered 2012-03-21: 2 via ORAL
  Filled 2012-03-19: qty 2

## 2012-03-19 MED ORDER — INSULIN ASPART PROT & ASPART (70-30 MIX) 100 UNIT/ML ~~LOC~~ SUSP
5.0000 [IU] | Freq: Once | SUBCUTANEOUS | Status: AC
  Administered 2012-03-19: 5 [IU] via SUBCUTANEOUS
  Filled 2012-03-19: qty 3

## 2012-03-19 MED ORDER — ENOXAPARIN SODIUM 40 MG/0.4ML ~~LOC~~ SOLN
40.0000 mg | SUBCUTANEOUS | Status: DC
  Administered 2012-03-19 – 2012-03-30 (×12): 40 mg via SUBCUTANEOUS
  Filled 2012-03-19 (×13): qty 0.4

## 2012-03-19 MED ORDER — INSULIN ASPART 100 UNIT/ML ~~LOC~~ SOLN
0.0000 [IU] | Freq: Three times a day (TID) | SUBCUTANEOUS | Status: DC
  Administered 2012-03-20 – 2012-03-24 (×3): 2 [IU] via SUBCUTANEOUS

## 2012-03-19 NOTE — Significant Event (Signed)
Hypoglycemic Event  CBG: 51  Treatment: 15 GM carbohydrate snack  Symptoms: None  Follow-up CBG: Time:1715 CBG Result:67  Possible Reasons for Event: Unknown  Comments/MD notified:yes    Madie Reno  Remember to initiate Hypoglycemia Order Set & complete

## 2012-03-19 NOTE — Progress Notes (Signed)
Pt admitted to rehab unit around 1415. Wife at bedside. Rehab booklet given and explained process to wife and patient. Belongings at bedside. Safety plan signed and reviewed by wife. Bed alarm on and pt resting. Cont. To monitor pt.

## 2012-03-19 NOTE — Care Management Note (Signed)
    Page 1 of 1   03/19/2012     2:12:17 PM   CARE MANAGEMENT NOTE 03/19/2012  Patient:  GRAE, CANNATA   Account Number:  [id number]  Date Initiated:  03/16/2012  Documentation initiated by:  Nussen Pullin  Subjective/Objective Assessment:   PT S/P LT FEM POP BYPASS GRAFT ON 03/11/12.  PT WITH PMH OF MULTIPLE SCLEROSIS, IS BASICALLY BED AND WHEELCHAIR BOUND. HIS WIFE CARES FOR HIM 24HR/DAILY.     Action/Plan:   MET WITH PT AND WIFE TO DISCUSS DC PLANS.  PT IS SCHEDULED FOR LT AKA TOMORROW.  WIFE HOPEFUL FOR INPT REHAB PRIOR TO DC HOME. PT HAS WC, TUB SEAT, BSC AT HOME.  PT RECOMMENDING A LIFT FOR HOME.   Anticipated DC Date:  03/21/2012   Anticipated DC Plan:  IP REHAB FACILITY      DC Planning Services  CM consult      Choice offered to / List presented to:             Status of service:  Completed, signed off Medicare Important Message given?   (If response is "NO", the following Medicare IM given date fields will be blank) Date Medicare IM given:   Date Additional Medicare IM given:    Discharge Disposition:  IP REHAB FACILITY  Per UR Regulation:  Reviewed for med. necessity/level of care/duration of stay  If discussed at Long Length of Stay Meetings, dates discussed:   03/17/2012  03/19/2012    Comments:  03/19/12 Hale Chalfin,RN,BSN PT TO DC TO [facility] IP [facility] TODAY S/P LT AKA.  PT AND WIFE PLEASED WITH DC TO CIR.

## 2012-03-19 NOTE — Progress Notes (Signed)
Can admit to CIR today.  If agree medically ready, please write d/c to CIR order.  Thanks. [phone]

## 2012-03-19 NOTE — Telephone Encounter (Addendum)
Message copied by Rosalyn Charters on Thu Mar 19, 2012 12:43 PM ------      Message from: Melene Plan      Created: Thu Mar 19, 2012 12:21 PM                   ----- Message -----         From: [name], [place]         Sent: 03/19/2012  11:27 AM           To: Melene Plan, RN            4 weeks - AKA Jaime [name]  notified patient of fu appt. with dr. Edilia Bo on 04-29-12 at 9:45 a.m. l/v/m and mailed appt. letter

## 2012-03-19 NOTE — Discharge Summary (Signed)
[facility] Discharge Summary   Patient ID:  Jaime [name] MRN: [id number] DOB/AGE: 1940-10-23 71 y.o.  Admit date: 03/11/2012 Discharge date: 03/19/2012 Date of Surgery: 03/11/2012 - 03/17/2012 Surgeon: Jaime [name]): Jaime Hertz, MD  Admission Diagnosis: CLAUDICATION thrombossed graft left leg Peripheral Vascular Disease  Discharge Diagnoses:  CLAUDICATION thrombossed graft left leg Peripheral Vascular Disease  Secondary Diagnoses: Past Medical History  Diagnosis Date  . Hyperlipidemia   . Cardiomyopathy, ischemic     EF 45% per ECHO 2008  . Pulmonary hypertension     moderate ECHO Jan 2008  . CHF (congestive heart failure)   . Systolic heart failure   . Multiple sclerosis DX 1993    NEUROLOGIST-  DR DOHMEIER -- LAST VISIT 11-20-2010  NOTE W/ CHART  . Lung nodule     resolved 11-2006 CT Chest  . Tobacco abuse   . Increased prostate specific antigen (PSA) velocity   . Urinary retention     dx ~ 2-12, like from MS, now with a catheter, saw urology  . Ejection fraction      EF 45%, echo, 2008  . Cellulitis   . Pre-syncope     Near syncope, ER visit, October, 2011  . Carotid artery disease     Doppler, February, 2012, 0-39% bilateral,Mild smooth plaque  . Chronic indwelling foley catheter   . Scoliosis associated with other condition   . Hemiparesis   . Fatigue SEVERE  . Weakness   . Impotence   . MI, acute, non ST segment elevation 2007    S/P PCI WITH X1 STENT (TAXUS DRUG-ELUTING) LEFT CIRCUMFLEX  . Echocardiogram abnormal 08-17-2006    SEVERE HYPOKINESIS OF INFERIOR , POSTEROR WALLS OF BASE AND MID VENTRICLE AND MOD. PULMONARY HTN  . Diabetes mellitus     INSULIN-DEPENDANT  . Hypertension   . H/O pleural effusion 2008    POST THORACENTESIS  . History of colon polyps PRECANCEROUS  . Insomnia   . Ingrown right big toenail CHRONIC    PER PT VERY SORE  . CAD (coronary artery disease) CARDIOLOGIST- DR KATZ-- VISIT 06-05-2011 IN [facility]   non-STEMI, 2007.Marland Kitchenoccluded circumflex.. Taxus stent placed...residual 80% LAD...50% RCA  . Pneumonia     hx of  . Urinary tract infection     hx of  . Neuromuscular disorder     multiple sclerosis  . Atherosclerotic PVD with ulceration     left foot    Procedure(s): 03/11/12 1. Left tibioperoneal trunk endarterectomy and patch angioplasty with vein  2. Left common femoral artery to tibioperoneal trunk bypass  3. Open cannulation of left tibioperoneal trunk  4. Left leg angiogram   03/17/12 AMPUTATION Left ABOVE KNEE  Discharged Condition: good  HPI: Jaime [name] is a 71 y.o. (11-06-1940) male who presents with chief complaint: left leg rest pain. The patient has rest pain and right foot ischemic changes have worsened. Patient denies any drainage or fever or chills. The patient's treatment regimen currently included: maximal medical management and narcotics. The patient has been undergoing preoperative cardiac work-up and preoperative optimization. Per Dr. Myrtis Ser, patient is at least at moderate risk for cardiac complications. .Based on the patient's vascular studies and examination, I have offered the patient: bypass with postop likely TMA vs L AKA.  He refuses to proceed with L AKA, so he was scheduled for : L CFA to TPT bypass likely with Propaten +/- vein cuff, likely BK pop endarterectomy with patch angioplasty on 21 AUG 13.  Hospital  Course:  Jaime [name] is a 71 y.o. male is S/P Left femoral to tibial bypass follwoed several days later by left AMPUTATION ABOVE KNEE. Extubated: POD # 0 Post-op wounds healing well Pt. Ambulating, voiding and taking PO diet without difficulty. Pt pain controlled with PO pain meds. Labs as below Complications: thrombosed LLE Fem Tibial bypass Pt was taken back to OR for: Thrombectomy of left femorotibioperoneal graft Redo patch angioplasty of left tibioperoneal trunk Patch angioplasty of left posterior tibial artery Open cannulation of left  femotrotibioperoneal bypass  Significant Diagnostic Studies: CBC Lab Results  Component Value Date   WBC 10.3 03/19/2012   HGB 9.6* 03/19/2012   HCT 29.6* 03/19/2012   MCV 80.0 03/19/2012   PLT 334 03/19/2012    BMET    Component Value Date/Time   NA 138 03/19/2012 0545   K 3.7 03/19/2012 0545   CL 103 03/19/2012 0545   CO2 24 03/19/2012 0545   GLUCOSE 114* 03/19/2012 0545   GLUCOSE 110* 05/12/2006 1139   BUN 11 03/19/2012 0545   CREATININE 0.92 03/19/2012 0545   CALCIUM 8.6 03/19/2012 0545   GFRNONAA 83* 03/19/2012 0545   GFRAA >90 03/19/2012 0545   COAG Lab Results  Component Value Date   INR 1.04 03/10/2012   INR 0.94 12/11/2011   INR 1.05 10/09/2010     Disposition:  Discharge to :Rehab Discharge Orders    Future Appointments: Provider: Department: Dept Phone: Center:   04/20/2012 3:00 PM Luis Abed, MD [facility] [place] [phone] [facility]   05/08/2012 11:15 AM Erick Colace, MD [facility] [phone] None   06/11/2012 10:30 AM Luis Abed, MD [facility] [phone] [facility]      [name], [name]  Home Medication Instructions BMW:[id number]   Printed on:03/19/12 1128  Medication Information                    amantadine (SYMMETREL) 100 MG capsule Take 200 mg by mouth 3 (three) times daily. By urologist           simvastatin (ZOCOR) 40 MG tablet Take 1 tablet (40 mg total) by mouth at bedtime.           insulin aspart protamine-insulin aspart (NOVOLOG 70/30) (70-30) 100 UNIT/ML injection Inject 10-30 Units into the skin 2 (two) times daily. 30 units at breakfast and 10 units at 10pm           Oxycodone HCl 10 MG TABS Take 10 mg by mouth 3 (three) times daily.           gabapentin (NEURONTIN) 300 MG capsule Take 300 mg by mouth 3 (three) times daily.           meloxicam (MOBIC) 15 MG tablet Take 15 mg by mouth daily.           aspirin EC 81 MG tablet Take 81 mg by mouth daily.           metoprolol succinate  (TOPROL-XL) 25 MG 24 hr tablet Take 25 mg by mouth daily.           diclofenac sodium (VOLTAREN) 1 % GEL Apply 1 application topically 4 (four) times daily as needed. For pain            Verbal and written Discharge instructions given to the patient. Wound care per Discharge AVS   Signed: [name] 03/19/2012, 11:28 AM  Addendum  I have independently interviewed and examined the patient, and I agree  with the physician assistant's discharge summary.  This patient with left foot dry gangrene presented for  left leg revascularization and attempt at transmetatarsal amputation.  In his first operation, his tibioperoneal trunk was severely diseased and outflow from the tibioperoneal trunk was severely diseased.  Unfortunately, he occluded his distal tibial arteries and also his bypass graft.  I brought him back to operating room and extended the endarterectomy from the distal below-the-knee popliteal artery into the posterior tibial artery.  This patient did not have adequate vein conduit for a femoral to tibial bypass, so I patched the endarterectomized arteries and reanastomosed the bypass graft to the patched tibioperoneal trunk.  The distal tibial arteries remained highly resistive despite treatment of proximal disease.  The bypass stayed open for another few days but eventually occluded.  Subsequently, I felt that left above-knee amputation was likely going to be necessary.  Intraoperatively, his left thigh had good arterial bleeding, so I expect this L AKA to heal without any difficulties.  He is going to be admitted to inpatient rehab.   He will follow in the office in 4 weeks for staple removal.  Leonides Sake, MD [facility] [place] Office: [phone] Pager: [phone]  03/19/2012, 1:52 PM

## 2012-03-19 NOTE — Progress Notes (Signed)
Orthopedic Tech Progress Note Patient Details:  Jaime [name] 1941/06/26 [id number] Applied overhead frame and trapeze bar.     [name] 03/19/2012, 4:09 PM

## 2012-03-19 NOTE — Care Management Note (Signed)
[facility]patient Rehabilitation Center Individual Statement of Services  Patient Name:  Jaime [name]  Date:  03/19/2012  Welcome to the Inpatient Rehabilitation Center.  Our goal is to provide you with an individualized program based on your diagnosis and situation, designed to meet your specific needs.  With this comprehensive rehabilitation program, you will be expected to participate in at least 3 hours of rehabilitation therapies Monday-Friday, with modified therapy programming on the weekends.  Your rehabilitation program will include the following services:  Physical Therapy (PT), Occupational Therapy (OT), 24 hour per day rehabilitation nursing, Therapeutic Recreaction (TR), Neuropsychology, Case Management (RN and Social Worker), Rehabilitation Medicine, Nutrition Services and Pharmacy Services  Weekly team conferences will be held on Wednesday to discuss your progress.  Your RN Case Designer, television/film set will talk with you frequently to get your input and to update you on team discussions.  Team conferences with you and your family in attendance may also be held.  Expected length of stay: 7 days  Overall anticipated outcome: caregiver to provide 24 hr physical care/                                                                                                                            Total assist  Depending on your progress and recovery, your program may change.  Your RN Case Estate agent will coordinate services and will keep you informed of any changes.  Your RN Sports coach and SW names and contact numbers are listed  below.  The following services may also be recommended but are not provided by the Inpatient Rehabilitation Center:   Driving Evaluations  Home Health Rehabiltiation Services  Outpatient Rehabilitatation [facility]  Vocational Rehabilitation   Arrangements will be made to provide these services after discharge if needed.  Arrangements include referral  to agencies that provide these services.  Your insurance has been verified to be:  Medicare, [employer] & Tricare Your primary doctor is:  Dr Willow Ora  Pertinent information will be shared with your doctor and your insurance company.   Social Worker:  Dossie Der, [place] [phone]  Information discussed with and copy given to patient by: [name], 03/19/2012, 3:48 PM

## 2012-03-19 NOTE — Significant Event (Signed)
Hypoglycemic Event  CBG: 67  Treatment: 15 GM carbohydrate snack  Symptoms: None  Follow-up CBG: Time:1735 CBG Result:81  Possible Reasons for Event: Unknown  Comments/MD notified:yes    Madie Reno  Remember to initiate Hypoglycemia Order Set & complete

## 2012-03-19 NOTE — Plan of Care (Signed)
Overall Plan of Care [facility]) Patient Details Name: Jaime [name] MRN: [id number] DOB: 1941-07-03  Diagnosis:  Rehabilitation for left above-knee amputation  Primary Diagnosis:    Unilateral AKA Co-morbidities: peripheral vascular disease with right foot ischemia MS, CHF Functional Problem List  Patient demonstrates impairments in the following areas: Balance, Bladder, Bowel, Cognition, Endurance, Medication Management, Motor, Nutrition, Pain, Safety and Skin Integrity  Basic ADL's: eating, grooming, bathing and dressing Advanced ADL's: N/A  Transfers:  bed mobility and bed to chair Locomotion:  wheelchair mobility  Additional Impairments:  Discharge Disposition  Anticipated Outcomes Item Anticipated Outcome  Eating/Swallowing    Basic self-care  Max Asist  Tolieting  Total Assist  Bowel/Bladder  Mod assist  Transfers  Total A via lift and hired caregiver  Locomotion  S power w/c  Communication    Cognition    Pain    3 or less  Safety/Judgment    Other     Therapy Plan: PT- 3 hrs/day 5 days per week  60-90 min  7 day ELOS   OT Frequency: 1-2 X/day, 60-90 minutes     Team Interventions: Item RN PT OT SLP SW TR Other  Self Care/Advanced ADL Retraining   x      Neuromuscular Re-Education         Therapeutic Activities  x x      UE/LE Strength Training/ROM  x x      UE/LE Coordination Activities   x      Visual/Perceptual Remediation/Compensation         DME/Adaptive Equipment Instruction  x x      Therapeutic Exercise  x x      Balance/Vestibular Training         Patient/Family Education x x x      Cognitive Remediation/Compensation  x x      Functional Mobility Training  x x      Ambulation/Gait Programmer, applications Propulsion/Positioning  x       Energy manager         Bladder  Management x        Bowel Management x        Disease Management/Prevention x        Pain Management x        Medication Management x        Skin Care/Wound Management         Splinting/Orthotics x        Discharge Planning  x x      Psychosocial Support                            Team Discharge Planning: Destination:  Home Projected Follow-up:  PT and Home Health Projected Equipment Needs:  will assess current w/c, will need lift for OOB Patient/family involved in discharge planning:  Yes  MD ELOS: 10 days Medical Rehab Prognosis:  Fair Assessment: 71 year old male with multiple sclerosis and chronic paraplegia as well as peripheral vascular disease underwent left AKA for ischemia a now requires 58 7 rehabilitation RN and M.D. As well as PT OT at CIR level

## 2012-03-19 NOTE — H&P (Signed)
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 male with history of MS with paraplegia and cognitive deficits, DM type 2, PVD with ischemic left foot and rest pain. Admitted on 03/11/12 for left CFA to tibioperoneal trunk bypass by Dr. Imogene Burn. Post op with loss of pulse due to acute thrombosis of tibioperoneal trunk with concomitant thrombosis of distal femorotibioperoneal bypass. He was taken back to OR for thrombectomy late that pm but with minimal perfusion of foot. L- AKA advised and patient may need R-AKA int he future if symptomatic. Patient agreeable to procedure and underwent L-AKA on 03/17/12  he denies any significant pain at the current time. No phantom limb pain. Has right foot pain Lee with weightbearing Review of Systems  HENT: Positive for ear pain.   Eyes: Negative for blurred vision.  Respiratory: Negative for shortness of breath.   Cardiovascular: Negative for chest pain.  Gastrointestinal: Negative for heartburn.  Genitourinary: Positive for urgency and frequency.       Chronic incontinence.   Neurological: Positive for sensory change and focal weakness. Negative for headaches.  Psychiatric/Behavioral: Positive for memory loss. Negative for depression. The patient has insomnia (sleeps a lot during the day and is up most of the night).    Past Medical History  Diagnosis Date  . Hyperlipidemia   . Cardiomyopathy, ischemic     EF 45% per ECHO 2008  . Pulmonary hypertension     moderate ECHO Jan 2008  . CHF (congestive heart failure)   . Systolic heart failure   . Multiple sclerosis DX 1993    NEUROLOGIST-  DR DOHMEIER -- LAST VISIT 11-20-2010  NOTE W/ CHART  . Lung nodule     resolved 11-2006 CT Chest  . Tobacco abuse   . Increased prostate specific antigen (PSA) velocity   . Urinary retention     dx ~ 2-12, like from MS, now with a catheter, saw urology  . Ejection fraction      EF 45%, echo, 2008  .  Cellulitis   . Pre-syncope     Near syncope, ER visit, October, 2011  . Carotid artery disease     Doppler, February, 2012, 0-39% bilateral,Mild smooth plaque  . Chronic indwelling foley catheter   . Scoliosis associated with other condition   . Hemiparesis   . Fatigue SEVERE  . Weakness   . Impotence   . MI, acute, non ST segment elevation 2007    S/P PCI WITH X1 STENT (TAXUS DRUG-ELUTING) LEFT CIRCUMFLEX  . Echocardiogram abnormal 08-17-2006    SEVERE HYPOKINESIS OF INFERIOR , POSTEROR WALLS OF BASE AND MID VENTRICLE AND MOD. PULMONARY HTN  . Diabetes mellitus     INSULIN-DEPENDANT  . Hypertension   . H/O pleural effusion 2008    POST THORACENTESIS  . History of colon polyps PRECANCEROUS  . Insomnia   . Ingrown right big toenail CHRONIC    PER PT VERY SORE  . CAD (coronary artery disease) CARDIOLOGIST- DR KATZ-- VISIT 06-05-2011 IN [facility]    non-STEMI, 2007.Marland Kitchenoccluded circumflex.. Taxus stent placed...residual 80% LAD...50% RCA  . Pneumonia     hx of  . Urinary tract infection     hx of  . Neuromuscular disorder     multiple sclerosis  . Atherosclerotic PVD with ulceration     left foot   Past Surgical History  Procedure Date  . Hernia repair 1990    (R)  .  Coronary angioplasty with stent placement 05-01-2006    OCCLUDED CIRCUMFLEX -- TAXUS STENT PLACMENT  AND RESIDUAL 80% LAD,  50% RCA  . Thoracentesis 2008    PLEURAL EFFUSION  . Cystoscopy 07/30/2011    Procedure: CYSTOSCOPY;  Surgeon: Lindaann Slough, MD;  Location: [facility];  Service: Urology;  Laterality: N/A;  . Transurethral resection of prostate 07/30/2011    Procedure: TRANSURETHRAL RESECTION OF THE PROSTATE (TURP);  Surgeon: Lindaann Slough, MD;  Location: [facility];  Service: Urology;  Laterality: N/A;  . Femoral-tibial bypass graft 03/11/2012    Procedure: BYPASS GRAFT FEMORAL-TIBIAL ARTERY;  Surgeon: Fransisco Hertz, MD;  Location: [facility] OR;  Service: Vascular;  Laterality: Left;   Left Femoral -Tibial trunk bypass, Endarterectomy of Tibial- Peroneal trunk with vein angioplasty.  . Intraoperative arteriogram 03/11/2012    Procedure: INTRA OPERATIVE ARTERIOGRAM;  Surgeon: Fransisco Hertz, MD;  Location: [facility] OR;  Service: Vascular;  Laterality: Left;  . Femoral-popliteal bypass graft 03/11/2012    Procedure: BYPASS GRAFT FEMORAL-POPLITEAL ARTERY;  Surgeon: Fransisco Hertz, MD;  Location: [facility] OR;  Service: Vascular;  Laterality: Left;  embolectomy left lower leg  . Amputation 03/17/2012    Procedure: AMPUTATION ABOVE KNEE;  Surgeon: Fransisco Hertz, MD;  Location: [facility] OR;  Service: Vascular;  Laterality: Left;   Family History  Problem Relation Age of Onset  . Hypertension    . Heart attack Mother 22  . Heart disease Mother   . Stroke Mother   . Hyperlipidemia Mother   . Hypertension Mother   . Colon cancer Neg Hx   . Prostate cancer Neg Hx   . COPD Father   . Peripheral vascular disease Father   . Diabetes Brother   . Heart disease Brother   . Hypertension Brother   . Heart attack Brother   . Diabetes Daughter    Social History: Married. Disabled since '94. Needed assistance with ADL's -sponge bathes. Needed assistance with squat transfers. Has a hospital bed with trapeze bar. Uses a power wheelchair. He continues to smoke 2-3 cigarettes daily. His smoking use includes E-cigarettes and regular Cigarettes. He has a 50 pack-year smoking history. He has never used smokeless tobacco. He reports that he does not drink alcohol or use illicit drugs.    Allergies  Allergen Reactions  . Bee Venom Anaphylaxis    Scheduled Meds:    . amantadine  200 mg Oral TID  . aspirin EC  81 mg Oral Daily  . docusate sodium  100 mg Oral Daily  . enoxaparin  40 mg Subcutaneous Q24H  . gabapentin  300 mg Oral TID  . insulin aspart  0-15 Units Subcutaneous TID WC  . insulin aspart protamine-insulin aspart  10 Units Subcutaneous Q supper  . insulin aspart protamine-insulin aspart  20 Units  Subcutaneous Q breakfast  . insulin aspart protamine-insulin aspart  5 Units Subcutaneous Once  . metoprolol succinate  25 mg Oral Daily  . pantoprazole  40 mg Oral Q1200  . simvastatin  40 mg Oral QHS  . DISCONTD: insulin aspart protamine-insulin aspart  10 Units Subcutaneous Q supper  . DISCONTD: insulin aspart protamine-insulin aspart  30 Units Subcutaneous Q breakfast   Continuous Infusions:  PRN Meds:.acetaminophen, alum & mag hydroxide-simeth, bisacodyl, diphenhydrAMINE, guaiFENesin-dextromethorphan, HYDROcodone-acetaminophen, methocarbamol, oxyCODONE, phenol, prochlorperazine, prochlorperazine, prochlorperazine, senna-docusate, sodium phosphate, traZODone  Medications Prior to Admission  Medication Sig Dispense Refill  . amantadine (SYMMETREL) 100 MG capsule Take 200 mg by mouth 3 (three) times  daily. By urologist      . aspirin EC 81 MG tablet Take 81 mg by mouth daily.      . diclofenac sodium (VOLTAREN) 1 % GEL Apply 1 application topically 4 (four) times daily as needed. For pain      . gabapentin (NEURONTIN) 300 MG capsule Take 300 mg by mouth 3 (three) times daily.      . insulin aspart protamine-insulin aspart (NOVOLOG 70/30) (70-30) 100 UNIT/ML injection Inject 10-30 Units into the skin 2 (two) times daily. 30 units at breakfast and 10 units at 10pm      . meloxicam (MOBIC) 15 MG tablet Take 15 mg by mouth daily.      . metoprolol succinate (TOPROL-XL) 25 MG 24 hr tablet Take 25 mg by mouth daily.      . Oxycodone HCl 10 MG TABS Take 10 mg by mouth 3 (three) times daily.      . simvastatin (ZOCOR) 40 MG tablet Take 1 tablet (40 mg total) by mouth at bedtime.  90 tablet  1    Home:     Functional History:    Functional Status:  Mobility:          ADL:    Cognition: Cognition Orientation Level: Oriented to person;Oriented to place;Disoriented to situation;Disoriented to time (orientation flucuates)     Blood pressure 115/85, pulse 77, temperature 98.4 F  (36.9 C), temperature source Oral, resp. rate 18, height 5\' 10"  (1.778 m), weight 76.3 kg (168 lb 3.4 oz), SpO2 96.00%. Physical Exam  Nursing note and vitals reviewed. Constitutional:       Thin well developed male  HENT:  Head: Normocephalic and atraumatic.  Eyes: Pupils are equal, round, and reactive to light.  Neck: Normal range of motion.  Cardiovascular: Normal rate and regular rhythm.   Murmur heard. Pulmonary/Chest: Effort normal and breath sounds normal.  Abdominal: Soft. Bowel sounds are normal.  Musculoskeletal: He exhibits tenderness (left BKA site.).  Neurological: He is alert.       Oriented to self only. Unable to state place or situation. Place "TN" unable to correct with max cues. Month "December" Follows basic commands without difficulty.  Polite. Decreased posture - slumped to the right with inability to correct.    Skin:       Right foot cold to touch.  L-AKA with staples intact. Clean and dry with stockinet in place.  Psychiatric: His speech is normal. Cognition and memory are impaired. He expresses inappropriate judgment. He exhibits abnormal recent memory and abnormal remote memory.  cerebellar testing shows dysmetria with finger nose to finger testing bilaterally Motor strength is 4/5 in bilateral deltoid, biceps, triceps, grip Right lower extremity strength is 3 minus in the hip flexors knee extensors and 2 minus at the ankle dorsiflexor plantar flexor and one at the toe flexors extensors. Sensation is reduced in the right foot.  Results for orders placed during the hospital encounter of 03/19/12 (from the past 48 hour(s))  GLUCOSE, CAPILLARY     Status: Abnormal   Collection Time   03/19/12  4:47 PM      Component Value Range Comment   Glucose-Capillary 51 (*) 70 - 99 mg/dL   GLUCOSE, CAPILLARY     Status: Abnormal   Collection Time   03/19/12  5:19 PM      Component Value Range Comment   Glucose-Capillary 67 (*) 70 - 99 mg/dL   GLUCOSE, CAPILLARY      Status: Normal   Collection  Time   03/19/12  5:35 PM      Component Value Range Comment   Glucose-Capillary 81  70 - 99 mg/dL    Comment 1 Notify RN      No results found.  Post Admission Physician Evaluation: 1. 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 2. Patient is admitted to receive collaborative, interdisciplinary care between the physiatrist, rehab nursing staff, and therapy team. 3. Patient's level of medical complexity and substantial therapy needs in context of that medical necessity cannot be provided at a lesser intensity of care such as a SNF. 4. Patient has experienced substantial functional loss from his/her baseline which was documented above under the "Functional History" and "Functional Status" headings.  Judging by the patient's diagnosis, physical exam, and functional history, the patient has potential for functional progress which will result in measurable gains while on inpatient rehab.  These gains will be of substantial and practical use upon discharge  in facilitating mobility and self-care at the household level. 5. Physiatrist will provide 24 hour management of medical needs as well as oversight of the therapy plan/treatment and provide guidance as appropriate regarding the interaction of the two. 6. 24 hour rehab nursing will assist with bladder management, bowel management, safety, skin/wound care, disease management, medication administration, pain management and patient education  and help integrate therapy concepts, techniques,education, etc. 7. PT will assess and treat for:  Transfers, wheelchair mobility, sitting balance, right lower extremity strengthening, endurance, equipment.  Goals are: supervision wheelchair level mobility. 8. OT will assess and treat for: DL's, cognitive perceptual skills, neuromuscular reeducation, sitting balance and tolerance, equipment, safety, endurance.   Goals are:  supervision for upper body and min assist for lower body ADL. 9. SLP will assess and treat for: memory, attention, concentration,.  Goals are: 100% orientation, cognition adequate for home environment. 10. Case Management and Social Worker will assess and treat for psychological issues and discharge planning. 11. Team conference will be held weekly to assess progress toward goals and to determine barriers to discharge. 12. Patient will receive at least 3 hours of therapy per day at least 5 days per week. 13. ELOS: 2 weeks      Prognosis:  fair   Medical Problem List and Plan: 1. DVT Prophylaxis/Anticoagulation: Pharmaceutical: Lovenox 2. Pain Management: prn  3. Mood: patient without awareness.  Will monitor.  4. Neuropsych: This patient is not capable of making decisions on his/her own behalf. 5. PVD BLE:  RLE not symptomatic currently and being monitored. 6. DM type 2-insulin dependent: monitor with AC/HS CBG checks.  Continue 70/30 insulin with SSI for elevated BS. 7. NGE:[id number] with bid checks.  8.CAD with ICM: continue metoprolol and zocor.  9. MS with paraparesis, cognitive deficits and neurogenic B/B 10. CHF: compensated. Monitor with daily weights.    03/19/2012, 5:44 PM

## 2012-03-19 NOTE — Progress Notes (Signed)
[facility] [place]  Daily Progress Note  Assessment/Planning: POD #2 s/p L AKA; POD #8 s/p L TPT & PT EA w/ BPA, L fem-pop   L AKA looks viable and healing  Possible CIR admission today pending bed availability  Confusion nothing new in this patient.  Seems to be confused for a day or two after general anesthesia.  Subjective  - 2 Days Post-Op  Pain controlled, some confusion today  Objective Filed Vitals:   03/17/12 2300 03/18/12 0501 03/18/12 1416 03/18/12 2104  BP: 174/104 130/83 102/59 103/67  Pulse:  73 70 74  Temp:  98.9 F (37.2 C) 98.5 F (36.9 C) 99.7 F (37.6 C)  TempSrc:  Oral Oral Oral  Resp:  22 18 18   Height:      Weight:      SpO2:  92% 99% 98%    Intake/Output Summary (Last 24 hours) at 03/19/12 0926 Last data filed at 03/18/12 2109  Gross per 24 hour  Intake    480 ml  Output    750 ml  Net   -270 ml    PULM  Less BLL rales CV  RRR GI  soft, NTND VASC  L AKA incision c/d/i, staples in place  Laboratory CBC    Component Value Date/Time   WBC 10.3 03/19/2012 0545   HGB 9.6* 03/19/2012 0545   HCT 29.6* 03/19/2012 0545   PLT 334 03/19/2012 0545    BMET    Component Value Date/Time   NA 138 03/19/2012 0545   K 3.7 03/19/2012 0545   CL 103 03/19/2012 0545   CO2 24 03/19/2012 0545   GLUCOSE 114* 03/19/2012 0545   GLUCOSE 110* 05/12/2006 1139   BUN 11 03/19/2012 0545   CREATININE 0.92 03/19/2012 0545   CALCIUM 8.6 03/19/2012 0545   GFRNONAA 83* 03/19/2012 0545   GFRAA >90 03/19/2012 0545    Leonides Sake, MD [facility] [place] Office: [phone] Pager: [phone]  03/19/2012, 9:26 AM

## 2012-03-20 ENCOUNTER — Inpatient Hospital Stay (HOSPITAL_COMMUNITY): Payer: Medicare Other

## 2012-03-20 ENCOUNTER — Inpatient Hospital Stay (HOSPITAL_COMMUNITY): Payer: Medicare Other | Admitting: *Deleted

## 2012-03-20 DIAGNOSIS — I739 Peripheral vascular disease, unspecified: Secondary | ICD-10-CM

## 2012-03-20 DIAGNOSIS — Z5189 Encounter for other specified aftercare: Secondary | ICD-10-CM

## 2012-03-20 DIAGNOSIS — L98499 Non-pressure chronic ulcer of skin of other sites with unspecified severity: Secondary | ICD-10-CM

## 2012-03-20 DIAGNOSIS — G35 Multiple sclerosis: Secondary | ICD-10-CM

## 2012-03-20 DIAGNOSIS — S78119A Complete traumatic amputation at level between unspecified hip and knee, initial encounter: Secondary | ICD-10-CM

## 2012-03-20 LAB — COMPREHENSIVE METABOLIC PANEL
BUN: 11 mg/dL (ref 6–23)
Calcium: 8.7 mg/dL (ref 8.4–10.5)
Creatinine, Ser: 1.01 mg/dL (ref 0.50–1.35)
GFR calc Af Amer: 85 mL/min — ABNORMAL LOW (ref >90)
Glucose, Bld: 146 mg/dL — ABNORMAL HIGH (ref 70–99)
Sodium: 140 mEq/L (ref 135–145)
Total Protein: 6.3 g/dL (ref 6.0–8.3)

## 2012-03-20 LAB — CBC WITH DIFFERENTIAL/PLATELET
Eosinophils Absolute: 0.3 10*3/uL (ref 0.0–0.7)
Eosinophils Relative: 3 % (ref 0–5)
Lymphs Abs: 1.6 10*3/uL (ref 0.7–4.0)
MCH: 25.9 pg — ABNORMAL LOW (ref 26.0–34.0)
MCV: 81.2 fL (ref 78.0–100.0)
Monocytes Relative: 10 % (ref 3–12)
Platelets: 353 10*3/uL (ref 150–400)
RBC: 3.51 MIL/uL — ABNORMAL LOW (ref 4.22–5.81)

## 2012-03-20 LAB — GLUCOSE, CAPILLARY: Glucose-Capillary: 110 mg/dL — ABNORMAL HIGH (ref 70–99)

## 2012-03-20 NOTE — Progress Notes (Signed)
Social Work Assessment and Plan Social Work Assessment and Plan  Patient Details  Name: Jaime [name] MRN: [id number] Date of Birth: 1941-06-01  Today's Date: 03/20/2012  Problem List:  Patient Active Problem List  Diagnosis  . DM  . HYPERLIPIDEMIA  . TOBACCO ABUSE  . Multiple sclerosis  . CARDIOMYOPATHY, ISCHEMIC  . CHF  . UNSPECIFIED SYSTOLIC HEART FAILURE  . BPH (benign prostatic hyperplasia)  . SHOULDER PAIN  . Diarrhea  . CAD (coronary artery disease)  . Carotid artery disease  . Other disorder of muscle, ligament, and fascia  . Critical lower limb ischemia  . Atherosclerotic PVD with ulceration  . Unilateral AKA   Past Medical History:  Past Medical History  Diagnosis Date  . Hyperlipidemia   . Cardiomyopathy, ischemic     EF 45% per ECHO 2008  . Pulmonary hypertension     moderate ECHO Jan 2008  . CHF (congestive heart failure)   . Systolic heart failure   . Multiple sclerosis DX 1993    NEUROLOGIST-  DR DOHMEIER -- LAST VISIT 11-20-2010  NOTE W/ CHART  . Lung nodule     resolved 11-2006 CT Chest  . Tobacco abuse   . Increased prostate specific antigen (PSA) velocity   . Urinary retention     dx ~ 2-12, like from MS, now with a catheter, saw urology  . Ejection fraction      EF 45%, echo, 2008  . Cellulitis   . Pre-syncope     Near syncope, ER visit, October, 2011  . Carotid artery disease     Doppler, February, 2012, 0-39% bilateral,Mild smooth plaque  . Chronic indwelling foley catheter   . Scoliosis associated with other condition   . Hemiparesis   . Fatigue SEVERE  . Weakness   . Impotence   . MI, acute, non ST segment elevation 2007    S/P PCI WITH X1 STENT (TAXUS DRUG-ELUTING) LEFT CIRCUMFLEX  . Echocardiogram abnormal 08-17-2006    SEVERE HYPOKINESIS OF INFERIOR , POSTEROR WALLS OF BASE AND MID VENTRICLE AND MOD. PULMONARY HTN  . Diabetes mellitus     INSULIN-DEPENDANT  . Hypertension   . H/O pleural effusion 2008    POST THORACENTESIS   . History of colon polyps PRECANCEROUS  . Insomnia   . Ingrown right big toenail CHRONIC    PER PT VERY SORE  . CAD (coronary artery disease) CARDIOLOGIST- DR KATZ-- VISIT 06-05-2011 IN [facility]    non-STEMI, 2007.Marland Kitchenoccluded circumflex.. Taxus stent placed...residual 80% LAD...50% RCA  . Pneumonia     hx of  . Urinary tract infection     hx of  . Neuromuscular disorder     multiple sclerosis  . Atherosclerotic PVD with ulceration     left foot   Past Surgical History:  Past Surgical History  Procedure Date  . Hernia repair 1990    (R)  . Coronary angioplasty with stent placement 05-01-2006    OCCLUDED CIRCUMFLEX -- TAXUS STENT PLACMENT  AND RESIDUAL 80% LAD,  50% RCA  . Thoracentesis 2008    PLEURAL EFFUSION  . Cystoscopy 07/30/2011    Procedure: CYSTOSCOPY;  Surgeon: Lindaann Slough, MD;  Location: [facility];  Service: Urology;  Laterality: N/A;  . Transurethral resection of prostate 07/30/2011    Procedure: TRANSURETHRAL RESECTION OF THE PROSTATE (TURP);  Surgeon: Lindaann Slough, MD;  Location: [facility];  Service: Urology;  Laterality: N/A;  . Femoral-tibial bypass graft 03/11/2012    Procedure: BYPASS  GRAFT FEMORAL-TIBIAL ARTERY;  Surgeon: Fransisco Hertz, MD;  Location: [facility] OR;  Service: Vascular;  Laterality: Left;  Left Femoral -Tibial trunk bypass, Endarterectomy of Tibial- Peroneal trunk with vein angioplasty.  . Intraoperative arteriogram 03/11/2012    Procedure: INTRA OPERATIVE ARTERIOGRAM;  Surgeon: Fransisco Hertz, MD;  Location: [facility] OR;  Service: Vascular;  Laterality: Left;  . Femoral-popliteal bypass graft 03/11/2012    Procedure: BYPASS GRAFT FEMORAL-POPLITEAL ARTERY;  Surgeon: Fransisco Hertz, MD;  Location: [facility] OR;  Service: Vascular;  Laterality: Left;  embolectomy left lower leg  . Amputation 03/17/2012    Procedure: AMPUTATION ABOVE KNEE;  Surgeon: Fransisco Hertz, MD;  Location: [facility] OR;  Service: Vascular;  Laterality: Left;   Social History:   reports that he has quit smoking. His smoking use included Cigarettes. He has a 50 pack-year smoking history. He has never used smokeless tobacco. He reports that he does not drink alcohol or use illicit drugs.  Family / Support Systems Marital Status: Married Patient Roles: Spouse;Parent Spouse/Significant Other: Santina Evans [phone]  [phone] Children: Daughter local Anticipated Caregiver: Wife or hired caregiver-wife has health issues and not able to pump manuel hoyer versus NHP Ability/Limitations of Caregiver: Wife not able to use manuel hoyer due to neck and muscle issues-possibly hiring assist Caregiver Availability: 24/7 Family Dynamics: Close knit couple, doesn't want to burden daughter.  Wife has provided 24 hour physical care prior to admission.  It seems pt's care has taken it's toll on his wife and her body.  Alos both are not young.  Social History Preferred language: English Religion: Baptist Cultural Background: No issues Education: [employer] Educated Read: Yes Write: Yes Employment Status: Retired Fish farm manager Issues: No issues Guardian/Conservator: None both pt and wife make the decisions-pt is capable of making his own decisions.   Abuse/Neglect Physical Abuse: Denies Verbal Abuse: Denies Sexual Abuse: Denies Exploitation of patient/patient's resources: Denies Self-Neglect: Denies  Emotional Status Pt's affect, behavior adn adjustment status: Pt wants to do what he can fro himself, but recognizes his wife has limitations.  The leg he had amputated was his strong leg, which makes movement difficult.  He is trying to stay positive and is involved in the decisions about his care. Recent Psychosocial Issues: Other medical issues Pyschiatric History: No history-depression screen deferred due to pt exhausted and falling asleep during my interview.  Will try again later, wife reports he has always rolled with everything, but this may be difficult for  him. Substance Abuse History: Tobacco still smokes unsure if will quit.  No ETOH use  Patient / Family Perceptions, Expectations & Goals Pt/Family understanding of illness & functional limitations: Pt and wife are able to expalin his amputation and the MD tired to save his leg.  Discussed the rehab process and training a caregiver while here.  Wife to pursue hired assist to have them come in and train for care at home. Premorbid pt/family roles/activities: Husband, Father, Retiree, HOme owner, Mining engineer, etc Anticipated changes in roles/activities/participation: Plans to resume Pt/family expectations/goals: Pt states: " I want to help my wife but unsure how."  Wife wants to provide care without compromising her own health.  She states: " I want him to be at home with someone who knows how to care for him".  Pt reprots; " This is something isn't it."  [employer]: Other (Comment) (Had HH in the past) Premorbid Home Care/DME Agencies: Other (Comment) (Numerous pieces of DME at home) Transportation available at discharge:  Wife and handicapped Zenaida Niece Resource referrals recommended: Support group (specify);Neuropsychology (MS Society Support Group)  Discharge Planning Living Arrangements: Spouse/significant other Support Systems: Spouse/significant other;Children;Friends/neighbors;Church/faith community Type of Residence: Private residence Insurance Resources: Medicare;Private Insurance (specify) (UHC & Tricare) Surveyor, quantity Resources: Social Security;Other (Comment) Merchant navy officer) Financial Screen Referred: No Living Expenses: Lives with family Money Management: Spouse Do you have any problems obtaining your medications?: No Home Management: Wife Patient/Family Preliminary Plans: Return home with wife and hired caregiver, if not an option then possible short term NHP.  Discussing all of the options with both and will come up with a plan. Barriers to Discharge:  Other (Comment) (Wife's health issues) Social Work Anticipated Follow Up Needs: HH/OP;Support Group DC Planning Additional Notes/Comments: Pt here to do family education short length of stay  Clinical Impression Unfortuanate gentleman who has multiple health issues and was total prior to admission for AKA.  Wife was providing this care but is limited herself with neck and muscle issues and can not use a manuel hoyer lift. Options presented hired assistance versus NHP.  Come up with a safe discharge plan for both.  [name] 03/20/2012, 11:46 AM

## 2012-03-20 NOTE — Progress Notes (Signed)
Patient ID: Jaime [name], male   DOB: 19-Apr-1941, 71 y.o.   MRN: [id number] Subjective/Complaints: Recognizes me by name although pt though he had already had therapy  Objective: Vital Signs: Blood pressure 142/89, pulse 80, temperature 98.1 F (36.7 C), temperature source Oral, resp. rate 18, height 5\' 10"  (1.778 m), weight 74.5 kg (164 lb 3.9 oz), SpO2 98.00%. No results found. Results for orders placed during the hospital encounter of 03/19/12 (from the past 72 hour(s))  GLUCOSE, CAPILLARY     Status: Abnormal   Collection Time   03/19/12  4:47 PM      Component Value Range Comment   Glucose-Capillary 51 (*) 70 - 99 mg/dL   GLUCOSE, CAPILLARY     Status: Abnormal   Collection Time   03/19/12  5:19 PM      Component Value Range Comment   Glucose-Capillary 67 (*) 70 - 99 mg/dL   GLUCOSE, CAPILLARY     Status: Normal   Collection Time   03/19/12  5:35 PM      Component Value Range Comment   Glucose-Capillary 81  70 - 99 mg/dL    Comment 1 Notify RN     URINALYSIS, ROUTINE W REFLEX MICROSCOPIC     Status: Abnormal   Collection Time   03/19/12  5:49 PM      Component Value Range Comment   Color, Urine YELLOW  YELLOW    APPearance CLEAR  CLEAR    Specific Gravity, Urine 1.009  1.005 - 1.030    pH 7.0  5.0 - 8.0    Glucose, UA NEGATIVE  NEGATIVE mg/dL    Hgb urine dipstick NEGATIVE  NEGATIVE    Bilirubin Urine NEGATIVE  NEGATIVE    Ketones, ur NEGATIVE  NEGATIVE mg/dL    Protein, ur NEGATIVE  NEGATIVE mg/dL    Urobilinogen, UA 2.0 (*) 0.0 - 1.0 mg/dL    Nitrite NEGATIVE  NEGATIVE    Leukocytes, UA SMALL (*) NEGATIVE   URINE MICROSCOPIC-ADD ON     Status: Normal   Collection Time   03/19/12  5:49 PM      Component Value Range Comment   Squamous Epithelial / LPF RARE  RARE    WBC, UA 0-2  <3 WBC/hpf    Urine-Other RARE YEAST     GLUCOSE, CAPILLARY     Status: Abnormal   Collection Time   03/19/12  9:16 PM      Component Value Range Comment   Glucose-Capillary 160 (*) 70 -  99 mg/dL   GLUCOSE, CAPILLARY     Status: Abnormal   Collection Time   03/20/12  7:11 AM      Component Value Range Comment   Glucose-Capillary 147 (*) 70 - 99 mg/dL    Comment 1 Notify RN        HEENT: dentures Cardio: RRR Resp: CTA B/L GI: BS positive Extremity:  No Edema Skin:   Wound C/D/I Neuro: Alert/Oriented, Normal Motor, Abnormal FMC Ataxic/ dec FMC, Reflexes 3+ and Tone  Hypertonia and Tone:  Hypertonia Musc/Skel:  Other scoliosis Cool R distal foot with cyanosis of toes  Assessment/Plan: 1. 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 Physiatrist is providing close team supervision and 24 hour management of active medical problems listed below. Physiatrist and rehab team continue to assess barriers to discharge/monitor patient progress toward functional and medical goals.  FIM:                   Comprehension Comprehension Mode: Auditory Comprehension: 3-Understands basic 50 - 74% of the time/requires cueing 25 - 50%  of the time  Expression Expression Mode: Verbal Expression: 3-Expresses basic 50 - 74% of the time/requires cueing 25 - 50% of the time. Needs to repeat parts of sentences.  Social Interaction Social Interaction: 4-Interacts appropriately 75 - 89% of the time - Needs redirection for appropriate language or to initiate interaction.  Problem Solving Problem Solving: 2-Solves basic 25 - 49% of the time - needs direction more than half the time to initiate, plan or complete simple activities  Memory Memory: 2-Recognizes or recalls 25 - 49% of the time/requires cueing 51 - 75% of the time  Medical Problem List and Plan:  1. DVT Prophylaxis/Anticoagulation: Pharmaceutical: Lovenox  2. Pain Management: prn  3. Mood: patient without awareness. Will monitor.  4. Neuropsych: This patient is not capable of making  decisions on his/her own behalf.  5. PVD BLE: RLE not symptomatic currently and being monitored.  6. DM type 2-insulin dependent: monitor with AC/HS CBG checks. Continue 70/30 insulin with SSI for elevated BS.  7. ZOX:WRUEAVW with bid checks.  8.CAD with ICM: continue metoprolol and zocor.  9. MS with paraparesis, cognitive deficits and neurogenic B/B  10. CHF: compensated. Monitor with daily weights    LOS (Days) 1 A FACE TO FACE EVALUATION WAS PERFORMED  KIRSTEINS,ANDREW E 03/20/2012, 7:31 AM

## 2012-03-20 NOTE — Progress Notes (Signed)
Patient information reviewed and entered into [facility] system by Georgine Wiltse, RN, CRRN, PPS Coordinator.  Information including medical coding and functional independence measure will be reviewed and updated through discharge.     Per nursing patient was given "Data Collection Information Summary for Patients in Inpatient Rehabilitation Facilities with attached "Privacy Act Statement-Health Care Records" upon admission.   

## 2012-03-20 NOTE — Evaluation (Signed)
Physical Therapy Assessment and Plan  Patient Details  Name: Jaime [name] MRN: [id number] Date of Birth: [birth year]  PT Diagnosis: Abnormal posture, Cognitive deficits, Impaired cognition, Muscle weakness and Paraplegia Rehab Potential: Fair ELOS:  1 wk   Today's Date: 03/20/2012 Time: 4098-1191 Time Calculation (min): 65 min  Problem List:  Patient Active Problem List  Diagnosis  . DM  . HYPERLIPIDEMIA  . TOBACCO ABUSE  . Multiple sclerosis  . CARDIOMYOPATHY, ISCHEMIC  . CHF  . UNSPECIFIED SYSTOLIC HEART FAILURE  . BPH (benign prostatic hyperplasia)  . SHOULDER PAIN  . Diarrhea  . CAD (coronary artery disease)  . Carotid artery disease  . Other disorder of muscle, ligament, and fascia  . Critical lower limb ischemia  . Atherosclerotic PVD with ulceration  . Unilateral AKA    Past Medical History:  Past Medical History  Diagnosis Date  . Hyperlipidemia   . Cardiomyopathy, ischemic     EF 45% per ECHO 2008  . Pulmonary hypertension     moderate ECHO Jan 2008  . CHF (congestive heart failure)   . Systolic heart failure   . Multiple sclerosis DX 1993    NEUROLOGIST-  DR DOHMEIER -- LAST VISIT 11-20-2010  NOTE W/ CHART  . Lung nodule     resolved 11-2006 CT Chest  . Tobacco abuse   . Increased prostate specific antigen (PSA) velocity   . Urinary retention     dx ~ 2-12, like from MS, now with a catheter, saw urology  . Ejection fraction      EF 45%, echo, 2008  . Cellulitis   . Pre-syncope     Near syncope, ER visit, October, 2011  . Carotid artery disease     Doppler, February, 2012, 0-39% bilateral,Mild smooth plaque  . Chronic indwelling foley catheter   . Scoliosis associated with other condition   . Hemiparesis   . Fatigue SEVERE  . Weakness   . Impotence   . MI, acute, non ST segment elevation 2007    S/P PCI WITH X1 STENT (TAXUS DRUG-ELUTING) LEFT CIRCUMFLEX  . Echocardiogram abnormal 08-17-2006    SEVERE HYPOKINESIS OF INFERIOR , POSTEROR  WALLS OF BASE AND MID VENTRICLE AND MOD. PULMONARY HTN  . Diabetes mellitus     INSULIN-DEPENDANT  . Hypertension   . H/O pleural effusion 2008    POST THORACENTESIS  . History of colon polyps PRECANCEROUS  . Insomnia   . Ingrown right big toenail CHRONIC    PER PT VERY SORE  . CAD (coronary artery disease) CARDIOLOGIST- DR KATZ-- VISIT 06-05-2011 IN [facility]    non-STEMI, 2007.Marland Kitchenoccluded circumflex.. Taxus stent placed...residual 80% LAD...50% RCA  . Pneumonia     hx of  . Urinary tract infection     hx of  . Neuromuscular disorder     multiple sclerosis  . Atherosclerotic PVD with ulceration     left foot   Past Surgical History:  Past Surgical History  Procedure Date  . Hernia repair 1990    (R)  . Coronary angioplasty with stent placement 05-01-2006    OCCLUDED CIRCUMFLEX -- TAXUS STENT PLACMENT  AND RESIDUAL 80% LAD,  50% RCA  . Thoracentesis 2008    PLEURAL EFFUSION  . Cystoscopy 07/30/2011    Procedure: CYSTOSCOPY;  Surgeon: Lindaann Slough, MD;  Location: [facility];  Service: Urology;  Laterality: N/A;  . Transurethral resection of prostate 07/30/2011    Procedure: TRANSURETHRAL RESECTION OF THE PROSTATE (TURP);  Surgeon: Lindaann Slough,  MD;  Location: [facility];  Service: Urology;  Laterality: N/A;  . Femoral-tibial bypass graft 03/11/2012    Procedure: BYPASS GRAFT FEMORAL-TIBIAL ARTERY;  Surgeon: Fransisco Hertz, MD;  Location: [facility] OR;  Service: Vascular;  Laterality: Left;  Left Femoral -Tibial trunk bypass, Endarterectomy of Tibial- Peroneal trunk with vein angioplasty.  . Intraoperative arteriogram 03/11/2012    Procedure: INTRA OPERATIVE ARTERIOGRAM;  Surgeon: Fransisco Hertz, MD;  Location: [facility] OR;  Service: Vascular;  Laterality: Left;  . Femoral-popliteal bypass graft 03/11/2012    Procedure: BYPASS GRAFT FEMORAL-POPLITEAL ARTERY;  Surgeon: Fransisco Hertz, MD;  Location: [facility] OR;  Service: Vascular;  Laterality: Left;  embolectomy left lower leg  .  Amputation 03/17/2012    Procedure: AMPUTATION ABOVE KNEE;  Surgeon: Fransisco Hertz, MD;  Location: [facility] OR;  Service: Vascular;  Laterality: Left;    Assessment & Plan Clinical Impression: Jaime [name] is a 71 y.o. male with history of MS with paraplegia and cognitive deficits, DM type 2, PVD with ischemic left foot and rest pain. Admitted on 03/11/12 for left CFA to tibioperoneal trunk bypass by Dr. Imogene Burn. Post op with loss of pulse due to acute thrombosis of tibioperoneal trunk with concomitant thrombosis of distal femorotibioperoneal bypass. He was taken back to OR for thrombectomy late that pm but with minimal perfusion of foot. L- AKA advised and patient may need R-AKA int he future if symptomatic. Patient agreeable to procedure and underwent L-AKA on 03/17/12  Patient transferred to CIR on 03/19/2012 . Goals for decreasing caregiver support/family education.  Patient currently requires total A +2 with mobility secondary to muscle weakness, muscle joint tightness and muscle paralysis, decreased cardiorespiratoy endurance, decreased awareness and decreased problem solving and decreased sitting balance, decreased postural control and decreased balance strategies.  Prior to hospitalization, patient was min to mod A PWC level with 24 hr care with mobility and lived with Spouse in a House home.  Home access is  Ramped entrance.  Patient will benefit from skilled PT intervention to maximize safe functional mobility, minimize fall risk and decrease caregiver burden for planned discharge home with 24 hour assist.  Anticipate patient will benefit from follow up [facility] at discharge.  PT - End of Session Activity Tolerance: Tolerates 10 - 20 min activity with multiple rests Endurance Deficit: Yes Endurance Deficit Description: premorbid d/t MS and CHF PT Assessment Rehab Potential: Fair Barriers to Discharge: Other (comment) Barriers to Discharge Comments: wife with lifting limitiations d/t health issues,  tried and unable to use hoyer PT Plan PT Frequency: 1-2 X/day, 60-90 minutes;5 out of 7 days Estimated Length of Stay:  1 wk PT Treatment/Interventions: Balance/vestibular training;Discharge planning;DME/adaptive equipment instruction;UE/LE Strength taining/ROM;Wheelchair propulsion/positioning;Therapeutic Exercise;Therapeutic Activities;Patient/family education PT Recommendation Follow Up Recommendations: Home health PT Equipment Recommended:  (lift, will assess current w/c)  PT Evaluation Precautions/Restrictions Precautions Precautions: Fall Precaution Comments: premorbid cognitive deficits, LLE WAS his "good" leg, MS General [email or web address] ([phone]) Vital Signs   Pain Pain Assessment Pain Assessment: No/denies pain Home Living/Prior Functioning Home Living Lives With: Spouse Available Help at Discharge: Available 24 hours/day;Family Type of Home: House Home Access: Ramped entrance Home Layout: One level Bathroom Shower/Tub: Engineer, manufacturing systems: Standard (uses bedpan or diapers) Bathroom Accessibility: No Home Adaptive Equipment: Bedside commode/3-in-1;Wheelchair - powered;Tub transfer bench;Other (comment) (trapeze) Additional Comments: trapeze Prior Function Level of Independence: Needs assistance with ADLs;Needs assistance with tranfers Bath: Maximal Toileting: Total Dressing: Maximal Grooming: Minimal Feeding: Minimal Meal Prep: Total Light Housekeeping: Total Able  to Take Stairs?: No Driving: No Vocation: Retired Gaffer: retired Hotel manager for 20 years Comments: likes to Scientist, physiological, go to [employer] and listen to his CDS taken Vision/Perception  Vision - History Baseline Vision:  (has glasses doesn't wear them.) Patient Visual Report: No change from baseline  Cognition Overall Cognitive Status: Impaired (more impaired than baseline per wife) Arousal/Alertness: Awake/alert Orientation Level: Oriented to person Sustained Attention:  Appears intact Memory: Impaired Awareness: Impaired Awareness Impairment: Intellectual impairment Problem Solving: Impaired Problem Solving Impairment: Functional basic Behaviors: Impulsive Safety/Judgment: Impaired Sensation Sensation Light Touch: Appears Intact Stereognosis: Appears Intact Hot/Cold: Appears Intact Proprioception: Impaired by gross assessment Coordination Gross Motor Movements are Fluid and Coordinated: No Fine Motor Movements are Fluid and Coordinated: No Coordination and Movement Description: impaired fine and gross motor coordination - baseline Motor  Motor Motor: Abnormal postural alignment and control;Abnormal tone;Paraplegia Motor - Skilled Clinical Observations: Ataxia due to MS.  Mobility Bed Mobility Bed Mobility: Rolling Right;Rolling Left Rolling Right: 2: Max assist;With rail Rolling Right: Patient Percentage: 50% Rolling Right Details: Verbal cues for technique;Verbal cues for safe use of DME/AE Rolling Left: 2: Max assist;With rail Rolling Left: Patient Percentage: 50% Rolling Left Details: Verbal cues for safe use of DME/AE;Verbal cues for technique Supine to Sit: With rails;1: +2 Total assist (trapeze) Supine to Sit: Patient Percentage: 10% Supine to Sit Details (indicate cue type and reason): per wife would get part way then pull self up with LUE on armrest of w/c, allowed him to use tech's arm with poor success Sitting - Scoot to Edge of Bed: 1: +2 Total assist Sitting - Scoot to Edge of Bed: Patient Percentage: 0% Sit to Supine: 1: +2 Total assist Transfers Transfer via Lift Equipment: Maximove Tour manager) Locomotion  Ambulation Ambulation: No Gait Gait: No  Trunk/Postural Assessment  Cervical Assessment Cervical Assessment: Within Functional Limits Thoracic Assessment Thoracic Assessment: Exceptions to [facility] Thoracic PROM Overall Thoracic PROM Comments: flared L/scoliosis Lumbar Assessment Lumbar Assessment: Exceptions to  [facility] Lumbar PROM Overall Lumbar PROM Comments: pelvis lifted on R, protracted Postural Control Postural Control: Deficits on evaluation Trunk Control: poor, d/t fixed trunk malalignment and wekness and abnormal tone and fear  Balance Static Sitting Balance Static Sitting - Balance Support: Bilateral upper extremity supported Static Sitting - Level of Assistance: 1: +2 Total assist Extremity Assessment  RUE Assessment RUE Assessment: Exceptions to [facility] RUE AROM (degrees) Overall AROM Right Upper Extremity: Within functional limits for tasks performed RUE Strength RUE Overall Strength Comments: MMT: 4-/5 LUE Assessment LUE Assessment: Exceptions to WFL LUE AROM (degrees) Overall AROM Left Upper Extremity: Within functional limits for tasks assessed LUE Strength LUE Overall Strength Comments: MMT: 4-/5 RLE Assessment RLE Assessment: Exceptions to [facility] RLE Strength RLE Overall Strength Comments: toght adductors LLE Assessment LLE Assessment: Exceptions to [facility] LLE Strength LLE Overall Strength Comments: 2-/5 abd/add, 3-/5 hip flexion, tight abd/add  See FIM for current functional status Refer to Care Plan for Long Term Goals  Recommendations for other services: None  Discharge Criteria: Patient will be discharged from PT if patient refuses treatment 3 consecutive times without medical reason, if treatment goals not met, if there is a change in medical status, if patient makes no progress towards goals or if patient is discharged from hospital.  The above assessment, treatment plan, treatment alternatives and goals were discussed and mutually agreed upon: by family  Skilled intervention:self/care/family ed, demonstrated and had wife return demonstrate hoyer lift transfer, she states the pumping is too much and  she is unable- informed social work and discussed with wife goals for her will be in the bed and transfer goal will be with hired caregiver (she got the list).  SW is looking  into insurance coverage of mechanical lift and this would be possibility if she can get one.  Pt positioned in tilt in space chair and wife shown how to pressure relieve every 30 min when up.  Wife to bring in pt own PWC so we can assess sitting posture and safety of sitting and propulsion.  Positioning poor d/t fixed trunk contractures/scoliosis but pt safe to sit in this chair.  Lap tray for UE support/positioning aid.  Michaelene Song 03/20/2012, 11:13 AM ,

## 2012-03-20 NOTE — Evaluation (Signed)
Occupational Therapy Assessment and Plan and Treatment Session  Patient Details  Name: Jaime [name] MRN: [id number] Date of Birth: August 24, 1940  OT Diagnosis: muscle weakness (generalized) and L AKA Rehab Potential: Rehab Potential: Fair ELOS: 7 days   Today's Date: 03/20/2012 Time: 4098-1191 Time Calculation (min): 60 min  Problem List:  Patient Active Problem List  Diagnosis  . DM  . HYPERLIPIDEMIA  . TOBACCO ABUSE  . Multiple sclerosis  . CARDIOMYOPATHY, ISCHEMIC  . CHF  . UNSPECIFIED SYSTOLIC HEART FAILURE  . BPH (benign prostatic hyperplasia)  . SHOULDER PAIN  . Diarrhea  . CAD (coronary artery disease)  . Carotid artery disease  . Other disorder of muscle, ligament, and fascia  . Critical lower limb ischemia  . Atherosclerotic PVD with ulceration  . Unilateral AKA    Past Medical History:  Past Medical History  Diagnosis Date  . Hyperlipidemia   . Cardiomyopathy, ischemic     EF 45% per ECHO 2008  . Pulmonary hypertension     moderate ECHO Jan 2008  . CHF (congestive heart failure)   . Systolic heart failure   . Multiple sclerosis DX 1993    NEUROLOGIST-  DR DOHMEIER -- LAST VISIT 11-20-2010  NOTE W/ CHART  . Lung nodule     resolved 11-2006 CT Chest  . Tobacco abuse   . Increased prostate specific antigen (PSA) velocity   . Urinary retention     dx ~ 2-12, like from MS, now with a catheter, saw urology  . Ejection fraction      EF 45%, echo, 2008  . Cellulitis   . Pre-syncope     Near syncope, ER visit, October, 2011  . Carotid artery disease     Doppler, February, 2012, 0-39% bilateral,Mild smooth plaque  . Chronic indwelling foley catheter   . Scoliosis associated with other condition   . Hemiparesis   . Fatigue SEVERE  . Weakness   . Impotence   . MI, acute, non ST segment elevation 2007    S/P PCI WITH X1 STENT (TAXUS DRUG-ELUTING) LEFT CIRCUMFLEX  . Echocardiogram abnormal 08-17-2006    SEVERE HYPOKINESIS OF INFERIOR , POSTEROR WALLS OF  BASE AND MID VENTRICLE AND MOD. PULMONARY HTN  . Diabetes mellitus     INSULIN-DEPENDANT  . Hypertension   . H/O pleural effusion 2008    POST THORACENTESIS  . History of colon polyps PRECANCEROUS  . Insomnia   . Ingrown right big toenail CHRONIC    PER PT VERY SORE  . CAD (coronary artery disease) CARDIOLOGIST- DR KATZ-- VISIT 06-05-2011 IN [facility]    non-STEMI, 2007.Marland Kitchenoccluded circumflex.. Taxus stent placed...residual 80% LAD...50% RCA  . Pneumonia     hx of  . Urinary tract infection     hx of  . Neuromuscular disorder     multiple sclerosis  . Atherosclerotic PVD with ulceration     left foot   Past Surgical History:  Past Surgical History  Procedure Date  . Hernia repair 1990    (R)  . Coronary angioplasty with stent placement 05-01-2006    OCCLUDED CIRCUMFLEX -- TAXUS STENT PLACMENT  AND RESIDUAL 80% LAD,  50% RCA  . Thoracentesis 2008    PLEURAL EFFUSION  . Cystoscopy 07/30/2011    Procedure: CYSTOSCOPY;  Surgeon: Lindaann Slough, MD;  Location: [facility];  Service: Urology;  Laterality: N/A;  . Transurethral resection of prostate 07/30/2011    Procedure: TRANSURETHRAL RESECTION OF THE PROSTATE (TURP);  Surgeon: Lindaann Slough,  MD;  Location: [facility];  Service: Urology;  Laterality: N/A;  . Femoral-tibial bypass graft 03/11/2012    Procedure: BYPASS GRAFT FEMORAL-TIBIAL ARTERY;  Surgeon: Fransisco Hertz, MD;  Location: [facility] OR;  Service: Vascular;  Laterality: Left;  Left Femoral -Tibial trunk bypass, Endarterectomy of Tibial- Peroneal trunk with vein angioplasty.  . Intraoperative arteriogram 03/11/2012    Procedure: INTRA OPERATIVE ARTERIOGRAM;  Surgeon: Fransisco Hertz, MD;  Location: [facility] OR;  Service: Vascular;  Laterality: Left;  . Femoral-popliteal bypass graft 03/11/2012    Procedure: BYPASS GRAFT FEMORAL-POPLITEAL ARTERY;  Surgeon: Fransisco Hertz, MD;  Location: [facility] OR;  Service: Vascular;  Laterality: Left;  embolectomy left lower leg  .  Amputation 03/17/2012    Procedure: AMPUTATION ABOVE KNEE;  Surgeon: Fransisco Hertz, MD;  Location: [facility] OR;  Service: Vascular;  Laterality: Left;    Assessment & Plan Clinical Impression: Patient is a 71 y.o. year old male with history of MS with paraplegia and cognitive deficits, DM type 2, PVD with ischemic left foot and rest pain. Admitted on 03/11/12 for left CFA to tibioperoneal trunk bypass by Dr. Imogene Burn. Post op with loss of pulse due to acute thrombosis of tibioperoneal trunk with concomitant thrombosis of distal femorotibioperoneal bypass. He was taken back to OR for thrombectomy late that pm but with minimal perfusion of foot. L- AKA advised and patient may need R-AKA int he future if symptomatic. Patient agreeable to procedure and underwent L-AKA on 03/17/12. Patient transferred to CIR on 03/19/2012 .    Patient currently requires total with basic self-care skills secondary to muscle weakness and ataxia, decreased coordination and decreased motor planning.  Prior to hospitalization, patient could complete BADL with Max Assist from wife. Patient sponge bathed in bed because his w/c would not fit in the bathroom. Patient used diapers and/or bedpan because he was unable to sit on BSC due to poor trunk control. Patient has a hospital bed and trapeze and used that to transfer in and out of bed with assist from wife. Patient and wife are here mainly for family education so wife will be able to continue to provide assistance at home. Wife is unable to use hoyer lift due to neck problems. Patient will benefit from skilled intervention to decrease level of assist with basic self-care skills prior to discharge home with care partner.  Anticipate patient will require 24 hour supervision and max physical assistance. and no further OT follow recommended.  OT Assessment Rehab Potential: Fair OT Plan OT Frequency: 1-2 X/day, 60-90 minutes Estimated Length of Stay: 7 days OT Treatment/Interventions: Discharge  planning;Functional mobility training;Patient/family education;Therapeutic Activities;Self Care/advanced ADL retraining;Therapeutic Exercise;DME/adaptive equipment instruction OT Recommendation Follow Up Recommendations: 24 hour supervision/assistance Equipment Recommended: None recommended by OT  OT Evaluation Precautions/Restrictions  Precautions Precautions: Fall Precaution Comments: premorbid cognitive deficits, LLE WAS his "good" leg, MS General   Vital Signs   Pain Pain Assessment Pain Assessment: No/denies pain Home Living/Prior Functioning Home Living Lives With: Spouse Available Help at Discharge: Available 24 hours/day;Family Type of Home: House Home Access: Ramped entrance Home Layout: One level Bathroom Shower/Tub: Engineer, manufacturing systems: Standard (uses bedpan or diapers) Bathroom Accessibility: No Home Adaptive Equipment: Bedside commode/3-in-1;Wheelchair - powered;Tub transfer bench;Other (comment) (trapeze) Additional Comments: trapeze Prior Function Level of Independence: Needs assistance with ADLs;Needs assistance with tranfers Bath: Maximal Toileting: Total Dressing: Maximal Grooming: Minimal Feeding: Minimal Meal Prep: Total Light Housekeeping: Total Able to Take Stairs?: No Driving: No Vocation: Retired Gaffer:  retired Hotel manager for 20 years Comments: likes to watch tv, go to [employer] and listen to his CDS taken ADL ADL Grooming: Minimal assistance;Moderate cueing Where Assessed-Grooming: Sitting at sink Upper Body Bathing: Dependent Where Assessed-Upper Body Bathing: Bed level Lower Body Bathing: Dependent Where Assessed-Lower Body Bathing: Bed level Upper Body Dressing: Dependent Where Assessed-Upper Body Dressing: Bed level Lower Body Dressing: Dependent Where Assessed-Lower Body Dressing: Bed level Toileting: Unable to assess Toilet Transfer: Unable to assess Tub/Shower Transfer: Unable to assess Vision/Perception   Vision - History Baseline Vision:  (has glasses doesn't wear them.) Patient Visual Report: No change from baseline  Cognition Overall Cognitive Status: Impaired at baseline Arousal/Alertness: Awake/alert Orientation Level: Oriented to person;Oriented to time;Oriented to place Memory: Impaired Awareness: Impaired Problem Solving: Impaired Behaviors: Impulsive Safety/Judgment: Impaired Sensation Sensation Light Touch: Appears Intact Stereognosis: Appears Intact Hot/Cold: Appears Intact Proprioception: Impaired by gross assessment Coordination Gross Motor Movements are Fluid and Coordinated: No Fine Motor Movements are Fluid and Coordinated: No Coordination and Movement Description: impaired fine and gross motor coordination - baseline Motor  Motor Motor: Ataxia Motor - Skilled Clinical Observations: Ataxia due to MS. Mobility  Bed Mobility Bed Mobility: Rolling Right;Rolling Left Rolling Right: 1: +2 Total assist Rolling Right: Patient Percentage: 50% Rolling Right Details: Verbal cues for sequencing;Verbal cues for technique Rolling Left: 1: +2 Total assist Rolling Left: Patient Percentage: 50% Rolling Left Details: Verbal cues for sequencing;Verbal cues for technique  Trunk/Postural Assessment  Thoracic Assessment Thoracic Assessment: Exceptions to [facility] Lumbar Assessment Lumbar Assessment: Exceptions to [facility] Postural Control Postural Control: Deficits on evaluation  Extremity/Trunk Assessment RUE Assessment RUE Assessment: Exceptions to [facility] RUE AROM (degrees) Overall AROM Right Upper Extremity: Within functional limits for tasks performed RUE Strength RUE Overall Strength Comments: MMT: 4-/5 LUE Assessment LUE Assessment: Exceptions to WFL LUE AROM (degrees) Overall AROM Left Upper Extremity: Within functional limits for tasks assessed LUE Strength LUE Overall Strength Comments: MMT: 4-/5  See FIM for current functional status Refer to Care Plan for Long Term  Goals  Skilled Therapeutic Interventions/Progress Updates:  ADL re-training session completed at bed level. Patient Total Assist for all bathing and dressing. Mechanical lift used for transfer from bed to w/c. Patient would transfer at home with a squat pivot using left leg since right leg has severe MS. Patient has poor trunk control. Plan is to have wife take patient home with hired help for transfers. Prior to hospitalization, patient could complete BADL with Max Assist from wife. Patient sponge bathed in bed because his w/c would not fit in the bathroom. Patient used diapers and/or bedpan because he was unable to sit on BSC due to poor trunk control. Patient has a hospital bed and trapeze and used that to transfer in and out of bed with assist from wife. Patient and wife are here mainly for family education so wife will be able to continue to provide assistance at home. Wife is unable to use hoyer lift due to neck problems. Pt impulsive and ataxic with movement due to MS. Patient needed verbal cues for technique during bed mobility.  Recommendations for other services: None  Discharge Criteria: Patient will be discharged from OT if patient refuses treatment 3 consecutive times without medical reason, if treatment goals not met, if there is a change in medical status, if patient makes no progress towards goals or if patient is discharged from hospital.  The above assessment, treatment plan, treatment alternatives and goals were discussed and mutually agreed upon: by patient and  by family  Limmie Patricia, OTR/L 03/20/2012, 10:47 AM

## 2012-03-20 NOTE — Progress Notes (Signed)
OccupationalTherapy Note  Patient Details  Name: Jaime [name] MRN: [id number] Date of Birth: 06-Mar-1941 Today's Date: 03/20/2012 Time;  1115-1215  (60 min) Pain:  None Individual therapy  Engaged in BUE PROM/AAROM with pt and wife.  Showed and demonstrated ROM exercises to each joint.  Did 10 times.  Wife verbalized understanding.  Addressed pressure relief by leaning over one side towards the bed and then turning the wc around to lean to the other side.  Pt. With sclerosis and has more weight on one side.  Performed hoyer transfer with wife verbalizing step by step instructions.  She needed assist pumping the manual lift due to her muscle problem.  Continue with education on all above to ensure reinforcement.   Humberto Seals 03/20/2012, 12:31 PM

## 2012-03-20 NOTE — Progress Notes (Signed)
[facility]  Daily Progress Note  Assessment/Planning: POD #3 s/p L AKA; POD #9 s/p L TPT & PT EA w/ BPA, L fem-pop   By report, L AKA incision is intact without drainage.    Follow in office in 4 weeks for staple removal.    Appreciate PMR's assistance with rehab in this pt.  Subjective   Pain controlled  Objective Filed Vitals:   03/19/12 1445 03/20/12 0515  BP: 115/85 142/89  Pulse: 77 80  Temp: 98.4 F (36.9 C) 98.1 F (36.7 C)  TempSrc: Oral Oral  Resp: 18 18  Height: 5\' 10"  (1.778 m)   Weight: 168 lb 3.4 oz (76.3 kg) 164 lb 3.9 oz (74.5 kg)  SpO2: 96% 98%    Intake/Output Summary (Last 24 hours) at 03/20/12 1353 Last data filed at 03/20/12 0700  Gross per 24 hour  Intake    480 ml  Output    600 ml  Net   -120 ml    PULM  CTAB CV  RRR GI  soft, NTND VASC  L AKA bandaged  Laboratory CBC    Component Value Date/Time   WBC 10.0 03/20/2012 0650   HGB 9.1* 03/20/2012 0650   HCT 28.5* 03/20/2012 0650   PLT 353 03/20/2012 0650    BMET    Component Value Date/Time   NA 140 03/20/2012 0650   K 4.2 03/20/2012 0650   CL 102 03/20/2012 0650   CO2 27 03/20/2012 0650   GLUCOSE 146* 03/20/2012 0650   GLUCOSE 110* 05/12/2006 1139   BUN 11 03/20/2012 0650   CREATININE 1.01 03/20/2012 0650   CALCIUM 8.7 03/20/2012 0650   GFRNONAA 73* 03/20/2012 0650   GFRAA 85* 03/20/2012 0650    Leonides Sake, MD [facility] Office: [phone] Pager: [phone]  03/20/2012, 1:53 PM

## 2012-03-20 NOTE — Progress Notes (Signed)
Physical Therapy Session Note  Patient Details  Name: BRYSEN SHANKMAN MRN: [id number] Date of Birth: [birth year]  Today's Date: 03/20/2012 Time: 2725-3664 Time Calculation (min): 28 min  Short Term Goals: Week 1:  PT Short Term Goal 1 (Week 1): STG same as LTG  Skilled Therapeutic Interventions/Progress Updates:  No pain at rest but stretch pain with B hip IR. Discussed that if RLE would IR more, it would aid his rolling L.  Passive stretch to hip and active/assist ROM IR/ER with LE straight, progressed with knee/hip bent and then rolling.  Pt rolls L best using trapeze and bar, progressed from total to mod A.  Discussed with pt/wife d/t scoliosis as well as amputation pt at risk for LLE adductor contracture and how this would limit hygeine, passive stretch to L adductors and demonstrated positioning in bed to maintain IR on R and abd on L.  Wife verbalized understanding.  Pt seemed more cognitively clear this pm.  See FIM for current functional status  Therapy/Group: Individual Therapy  Michaelene Song 03/20/2012, 2:15 PM

## 2012-03-21 ENCOUNTER — Inpatient Hospital Stay (HOSPITAL_COMMUNITY): Payer: Medicare Other | Admitting: Occupational Therapy

## 2012-03-21 ENCOUNTER — Inpatient Hospital Stay (HOSPITAL_COMMUNITY): Payer: Medicare Other | Admitting: Physical Therapy

## 2012-03-21 LAB — GLUCOSE, CAPILLARY
Glucose-Capillary: 100 mg/dL — ABNORMAL HIGH (ref 70–99)
Glucose-Capillary: 123 mg/dL — ABNORMAL HIGH (ref 70–99)
Glucose-Capillary: 100 mg/dL — ABNORMAL HIGH (ref 70–99)
Glucose-Capillary: 44 mg/dL — CL (ref 70–99)
Glucose-Capillary: 124 mg/dL — ABNORMAL HIGH (ref 70–99)

## 2012-03-21 NOTE — Progress Notes (Signed)
Patient ID: Jaime [name], male   DOB: February 24, 1941, 71 y.o.   MRN: [id number] Patient ID: Jaime [name], male   DOB: [birth year], 71 y.o.   MRN: [id number] Subjective/Complaints:  8/31.  Alert and offers no complaints but confused. Examination. ENT negative. Chest clear. Cardiovascular normal heart sounds no murmurs. Abdomen soft and nondistended. Extremities. Status post left AKA  CBG (last 3)   Basename 03/21/12 0728 03/20/12 2117 03/20/12 1640  GLUCAP 100* 89 110*     BP Readings from Last 3 Encounters:  03/21/12 118/74  03/18/12 103/67  03/18/12 103/67    Objective: Vital Signs: Blood pressure 118/74, pulse 72, temperature 98.4 F (36.9 C), temperature source Oral, resp. rate 20, height 5\' 10"  (1.778 m), weight 74.5 kg (164 lb 3.9 oz), SpO2 94.00%. No results found. Results for orders placed during the hospital encounter of 03/19/12 (from the past 72 hour(s))  GLUCOSE, CAPILLARY     Status: Abnormal   Collection Time   03/19/12  4:47 PM      Component Value Range Comment   Glucose-Capillary 51 (*) 70 - 99 mg/dL   GLUCOSE, CAPILLARY     Status: Abnormal   Collection Time   03/19/12  5:19 PM      Component Value Range Comment   Glucose-Capillary 67 (*) 70 - 99 mg/dL   GLUCOSE, CAPILLARY     Status: Normal   Collection Time   03/19/12  5:35 PM      Component Value Range Comment   Glucose-Capillary 81  70 - 99 mg/dL    Comment 1 Notify RN     URINALYSIS, ROUTINE W REFLEX MICROSCOPIC     Status: Abnormal   Collection Time   03/19/12  5:49 PM      Component Value Range Comment   Color, Urine YELLOW  YELLOW    APPearance CLEAR  CLEAR    Specific Gravity, Urine 1.009  1.005 - 1.030    pH 7.0  5.0 - 8.0    Glucose, UA NEGATIVE  NEGATIVE mg/dL    Hgb urine dipstick NEGATIVE  NEGATIVE    Bilirubin Urine NEGATIVE  NEGATIVE    Ketones, ur NEGATIVE  NEGATIVE mg/dL    Protein, ur NEGATIVE  NEGATIVE mg/dL    Urobilinogen, UA 2.0 (*) 0.0 - 1.0 mg/dL    Nitrite NEGATIVE   NEGATIVE    Leukocytes, UA SMALL (*) NEGATIVE   URINE CULTURE     Status: Normal (Preliminary result)   Collection Time   03/19/12  5:49 PM      Component Value Range Comment   Specimen Description URINE, CATHETERIZED      Special Requests NONE      Culture  Setup Time 03/20/2012 01:27      Colony Count PENDING      Culture Culture reincubated for better growth      Report Status PENDING     URINE MICROSCOPIC-ADD ON     Status: Normal   Collection Time   03/19/12  5:49 PM      Component Value Range Comment   Squamous Epithelial / LPF RARE  RARE    WBC, UA 0-2  <3 WBC/hpf    Urine-Other RARE YEAST     GLUCOSE, CAPILLARY     Status: Abnormal   Collection Time   03/19/12  9:16 PM      Component Value Range Comment   Glucose-Capillary 160 (*) 70 - 99 mg/dL   CBC WITH DIFFERENTIAL  Status: Abnormal   Collection Time   03/20/12  6:50 AM      Component Value Range Comment   WBC 10.0  4.0 - 10.5 K/uL    RBC 3.51 (*) 4.22 - 5.81 MIL/uL    Hemoglobin 9.1 (*) 13.0 - 17.0 g/dL    HCT 65.7 (*) 84.6 - 52.0 %    MCV 81.2  78.0 - 100.0 fL    MCH 25.9 (*) 26.0 - 34.0 pg    MCHC 31.9  30.0 - 36.0 g/dL    RDW 96.2 (*) 95.2 - 15.5 %    Platelets 353  150 - 400 K/uL    Neutrophils Relative 71  43 - 77 %    Neutro Abs 7.1  1.7 - 7.7 K/uL    Lymphocytes Relative 16  12 - 46 %    Lymphs Abs 1.6  0.7 - 4.0 K/uL    Monocytes Relative 10  3 - 12 %    Monocytes Absolute 1.0  0.1 - 1.0 K/uL    Eosinophils Relative 3  0 - 5 %    Eosinophils Absolute 0.3  0.0 - 0.7 K/uL    Basophils Relative 0  0 - 1 %    Basophils Absolute 0.0  0.0 - 0.1 K/uL   COMPREHENSIVE METABOLIC PANEL     Status: Abnormal   Collection Time   03/20/12  6:50 AM      Component Value Range Comment   Sodium 140  135 - 145 mEq/L    Potassium 4.2  3.5 - 5.1 mEq/L    Chloride 102  96 - 112 mEq/L    CO2 27  19 - 32 mEq/L    Glucose, Bld 146 (*) 70 - 99 mg/dL    BUN 11  6 - 23 mg/dL    Creatinine, Ser 8.41  0.50 - 1.35 mg/dL     Calcium 8.7  8.4 - 10.5 mg/dL    Total Protein 6.3  6.0 - 8.3 g/dL    Albumin 2.5 (*) 3.5 - 5.2 g/dL    AST 16  0 - 37 U/L    ALT 19  0 - 53 U/L    Alkaline Phosphatase 65  39 - 117 U/L    Total Bilirubin 0.4  0.3 - 1.2 mg/dL    GFR calc non Af Amer 73 (*) >90 mL/min    GFR calc Af Amer 85 (*) >90 mL/min   GLUCOSE, CAPILLARY     Status: Abnormal   Collection Time   03/20/12  7:11 AM      Component Value Range Comment   Glucose-Capillary 147 (*) 70 - 99 mg/dL    Comment 1 Notify RN     GLUCOSE, CAPILLARY     Status: Normal   Collection Time   03/20/12 11:05 AM      Component Value Range Comment   Glucose-Capillary 95  70 - 99 mg/dL    Comment 1 Notify RN     GLUCOSE, CAPILLARY     Status: Abnormal   Collection Time   03/20/12  4:40 PM      Component Value Range Comment   Glucose-Capillary 110 (*) 70 - 99 mg/dL   GLUCOSE, CAPILLARY     Status: Normal   Collection Time   03/20/12  9:17 PM      Component Value Range Comment   Glucose-Capillary 89  70 - 99 mg/dL    Comment 1 Notify RN     GLUCOSE, CAPILLARY  Status: Abnormal   Collection Time   03/21/12  7:28 AM      Component Value Range Comment   Glucose-Capillary 100 (*) 70 - 99 mg/dL    Comment 1 Notify RN        HEENT: dentures Cardio: RRR Resp: CTA B/L GI: BS positive Extremity:  No Edema Skin:   Wound C/D/I Neuro: Alert/Oriented, Normal Motor, Abnormal FMC Ataxic/ dec FMC, Reflexes 3+ and Tone  Hypertonia and Tone:  Hypertonia Musc/Skel:  Other scoliosis Cool R distal foot with cyanosis of toes  Assessment/Plan: 1. 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 Physiatrist is providing close team supervision and 24 hour management of active medical problems listed below. Physiatrist and rehab team continue to assess barriers to discharge/monitor patient progress toward functional  and medical goals. FIM: FIM - Bathing Bathing Steps Patient Completed: Chest;Right Arm;Left Arm;Abdomen;Front perineal area;Buttocks;Right upper leg;Left upper leg;Right lower leg (including foot);Left lower leg (including foot) Bathing: 1: Total-Patient completes 0-2 of 10 parts or less than 25%  FIM - Upper Body Dressing/Undressing Upper body dressing/undressing steps patient completed: Thread/unthread right sleeve of pullover shirt/dresss;Thread/unthread left sleeve of pullover shirt/dress;Put head through opening of pull over shirt/dress;Pull shirt over trunk Upper body dressing/undressing: 1: Total-Patient completed less than 25% of tasks FIM - Lower Body Dressing/Undressing Lower body dressing/undressing steps patient completed: Thread/unthread right underwear leg;Thread/unthread left underwear leg;Pull underwear up/down;Thread/unthread right pants leg;Thread/unthread left pants leg;Pull pants up/down;Don/Doff right sock Lower body dressing/undressing: 1: Total-Patient completed less than 25% of tasks  FIM - Toileting Toileting: 0: Activity did not occur  FIM - Archivist Transfers: 0-Activity did not occur  FIM - Games developer Transfer: 1: Mechanical lift;1: Two helpers  FIM - Locomotion: Wheelchair Locomotion: Wheelchair: 1: Total Assistance/staff pushes wheelchair (Pt<25%) FIM - Locomotion: Ambulation Locomotion: Ambulation: 0: Activity did not occur (pt nonambulatory PTA)  Comprehension Comprehension Mode: Auditory Comprehension: 3-Understands basic 50 - 74% of the time/requires cueing 25 - 50%  of the time  Expression Expression Mode: Verbal Expression: 2-Expresses basic 25 - 49% of the time/requires cueing 50 - 75% of the time. Uses single words/gestures.  Social Interaction Social Interaction: 2-Interacts appropriately 25 - 49% of time - Needs frequent redirection.  Problem Solving Problem Solving: 1-Solves basic less than 25% of the  time - needs direction nearly all the time or does not effectively solve problems and may need a restraint for safety  Memory Memory: 1-Recognizes or recalls less than 25% of the time/requires cueing greater than 75% of the time  Medical Problem List and Plan:  1. DVT Prophylaxis/Anticoagulation: Pharmaceutical: Lovenox  2. Pain Management: prn  3. Mood: patient without awareness. Will monitor.  4. Neuropsych: This patient is not capable of making decisions on his/her own behalf.  5. PVD BLE: RLE not symptomatic currently and being monitored.  6. DM type 2-insulin dependent: monitor with AC/HS CBG checks. Continue 70/30 insulin with SSI for elevated BS.  7. ZOX:WRUEAVW with bid checks.  8.CAD with ICM: continue metoprolol and zocor.  9. MS with paraparesis, cognitive deficits and neurogenic B/B  10. CHF: compensated. Monitor with daily weights    LOS (Days) 2 A FACE TO FACE EVALUATION WAS PERFORMED  Rogelia Boga 03/21/2012, 8:31 AM

## 2012-03-21 NOTE — Progress Notes (Addendum)
Occupational Therapy Session Note  Patient Details  Name: Jaime [name] MRN: [id number] Date of Birth: [birth year]  Today's Date: 03/21/2012 Time: [phone] and 1520-1550 Time Calculation (min): 45 min and 30 minutes=75 minutes    Skilled Therapeutic Interventions/Progress Updates:  AM Session: Upon approach for therapy patient stated, "My wife is coming to take me home.  She will be here any minute."  Though patient acknowledged he was at [facility], this clinician was unable to help him understand that he was on the in patient therapy unit and that he was not scheduled to go home today.     Patient scheduled for ADL but unable to complete more than about 10% overall and required constant cueing to initiate and complete the tasks required.  As well, due his poor trunk control was inhibited to supported balance in the bed as he constantly laterally leaned right.  Patient unable to right self and utilize trunk stability.  PM session:  Assisted wife with changing patient's pants and bed mobility and positioning off pressure sore and onto patient's right side.  Patient shirt and pillow wet.  Wife suspected fever.  Nursing staff taking vital signs and assessing when this clinician exited the room to work with other patient.     Therapy Documentation Precautions:  Precautions Precautions: Fall Precaution Comments: premorbid cognitive deficits, LLE WAS his "good" leg, MS  Pain: denied    See FIM for current functional status  Therapy/Group: Individual Therapy  Rozelle Logan 03/21/2012, 12:35 PM

## 2012-03-21 NOTE — Progress Notes (Signed)
Physical Therapy Note  Patient Details  Name: Jaime [name] MRN: [id number] Date of Birth: 1941-01-25 Today's Date: 03/21/2012  8:30-9:30 individual therapy pt denied pain.   perforemd  PROM and stretching to rt hip x 10, rolling to both ways with mod assist with vc to hook rail to conserve energy instead of using trapeze to lift himself. Used hoyer for transfer. Initiated change in wc for more comfort and less pressure on the spine.  [name] 03/21/2012, 10:30 AM

## 2012-03-21 NOTE — Progress Notes (Signed)
Physical Therapy Note  Patient Details  Name: Jaime [name] MRN: [id number] Date of Birth: Apr 08, 1941 Today's Date: 03/21/2012  14:00-15:00 individual therapy pt denied pain unless left leg was touched.  Wife was present this session. She states pt was transferring himself unassisted to wc using trapeze PTA. attempted to have pt sit EOB and demonstrated transfer without success. Wife was educated on having pt use rail to roll instead of trapeze to decrease energy use. She was taught positioning for pressure relief. Pt fatigued before being able to transfer to his wc.   [name] 03/21/2012, 4:35 PM

## 2012-03-21 NOTE — Significant Event (Signed)
Hypoglycemic Event  CBG:44   Treatment: 15 GM carbohydrate snack  Symptoms: None  Follow-up CBG: Time:2157 CBG Result:124  Possible Reasons for Event: Unknown  Comments/MD notified:Patient CBG increased after following protocol, denies and symptoms of hypoglycemia, will continue to monitor patient.    Abrianna Sidman, Phill Mutter  Remember to initiate Hypoglycemia Order Set & complete

## 2012-03-22 ENCOUNTER — Inpatient Hospital Stay (HOSPITAL_COMMUNITY): Payer: Medicare Other

## 2012-03-22 LAB — GLUCOSE, CAPILLARY: Glucose-Capillary: 120 mg/dL — ABNORMAL HIGH (ref 70–99)

## 2012-03-22 LAB — URINE CULTURE

## 2012-03-22 MED ORDER — INSULIN ASPART PROT & ASPART (70-30 MIX) 100 UNIT/ML ~~LOC~~ SUSP
10.0000 [IU] | Freq: Every day | SUBCUTANEOUS | Status: DC
  Administered 2012-03-22 – 2012-03-24 (×3): 10 [IU] via SUBCUTANEOUS
  Filled 2012-03-22: qty 10

## 2012-03-22 MED ORDER — INSULIN ASPART PROT & ASPART (70-30 MIX) 100 UNIT/ML ~~LOC~~ SUSP
10.0000 [IU] | Freq: Every day | SUBCUTANEOUS | Status: DC
  Filled 2012-03-22: qty 3

## 2012-03-22 NOTE — Progress Notes (Signed)
Patient appeared to be sleep off and on this shift. Talks loudly in sleep at times. Speech continues to be garbled and unable to make out certain words. No complaints. Condom cath in place for incontinence. No signs/symptoms of hypoglycemia noted throughout the shift. Continue plan of care

## 2012-03-22 NOTE — Progress Notes (Signed)
Physical Therapy Session Note  Patient Details  Name: Jaime [name] MRN: [id number] Date of Birth: Jul 06, 1941  Today's Date: 03/22/2012 Time: 0950-1030 Time Calculation (min): 40 min  Short Term Goals: Week 1:  PT Short Term Goal 1 (Week 1): STG same as LTG  No family present, but pt's power w/c in the room. Pt unable to report if he had been in that w/c yet. With encouragement, agreeable to OOB. Bed mobility for rolling in order to put on pants and place hoyer sling with mod A using bed rails. Hoyer lift transfer to power w/c and adjusted chair with some tilt for positioning. W/c mobility training on unit with min A needed for turning and backing up, able to use hand controls in straight line with supervision. Nurse tech made aware this may be first trial in this w/c and check on patient more often. Seat belt on and power off while pt watching tv with call bell in reach.     Therapy Documentation Precautions:  Precautions Precautions: Fall Precaution Comments: premorbid cognitive deficits, LLE WAS his "good" leg, MS Restrictions Weight Bearing Restrictions: Yes LLE Weight Bearing: Non weight bearing    Pain:  No complaints of pain.  See FIM for current functional status  Therapy/Group: Individual Therapy  Karolee Stamps [facility] 03/22/2012, 11:09 AM

## 2012-03-22 NOTE — Progress Notes (Signed)
Patient ID: Jaime [name], male   DOB: Jan 24, 1941, 71 y.o.   MRN: [id number] Patient ID: Jaime [name], male   DOB: 1940-08-24, 71 y.o.   MRN: [id number] Patient ID: Jaime [name], male   DOB: 1941/04/13, 71 y.o.   MRN: [id number] Subjective/Complaints:  9/1.    Remains alert and offers no complaints but confused. Examination. ENT negative. Chest clear. Cardiovascular normal heart sounds no murmurs. Abdomen soft and nondistended. Extremities. Status post left AKA Blood sugar remained tightly controlled and had a hypoglycemic reading of 44 yesterday. We'll down titrate 70/30 insulin  CBG (last 3)   Basename 03/21/12 2157 03/21/12 2104 03/21/12 1807  GLUCAP 124* 44* 100*     BP Readings from Last 3 Encounters:  03/22/12 128/83  03/18/12 103/67  03/18/12 103/67    Objective: Vital Signs: Blood pressure 128/83, pulse 68, temperature 98.7 F (37.1 C), temperature source Oral, resp. rate 20, height 5\' 10"  (1.778 m), weight 74.7 kg (164 lb 10.9 oz), SpO2 97.00%. No results found. Results for orders placed during the hospital encounter of 03/19/12 (from the past 72 hour(s))  GLUCOSE, CAPILLARY     Status: Abnormal   Collection Time   03/19/12  4:47 PM      Component Value Range Comment   Glucose-Capillary 51 (*) 70 - 99 mg/dL   GLUCOSE, CAPILLARY     Status: Abnormal   Collection Time   03/19/12  5:19 PM      Component Value Range Comment   Glucose-Capillary 67 (*) 70 - 99 mg/dL   GLUCOSE, CAPILLARY     Status: Normal   Collection Time   03/19/12  5:35 PM      Component Value Range Comment   Glucose-Capillary 81  70 - 99 mg/dL    Comment 1 Notify RN     URINALYSIS, ROUTINE W REFLEX MICROSCOPIC     Status: Abnormal   Collection Time   03/19/12  5:49 PM      Component Value Range Comment   Color, Urine YELLOW  YELLOW    APPearance CLEAR  CLEAR    Specific Gravity, Urine 1.009  1.005 - 1.030    pH 7.0  5.0 - 8.0    Glucose, UA NEGATIVE  NEGATIVE mg/dL    Hgb urine dipstick  NEGATIVE  NEGATIVE    Bilirubin Urine NEGATIVE  NEGATIVE    Ketones, ur NEGATIVE  NEGATIVE mg/dL    Protein, ur NEGATIVE  NEGATIVE mg/dL    Urobilinogen, UA 2.0 (*) 0.0 - 1.0 mg/dL    Nitrite NEGATIVE  NEGATIVE    Leukocytes, UA SMALL (*) NEGATIVE   URINE CULTURE     Status: Normal (Preliminary result)   Collection Time   03/19/12  5:49 PM      Component Value Range Comment   Specimen Description URINE, CATHETERIZED      Special Requests NONE      Culture  Setup Time 03/20/2012 01:27      Colony Count PENDING      Culture Culture reincubated for better growth      Report Status PENDING     URINE MICROSCOPIC-ADD ON     Status: Normal   Collection Time   03/19/12  5:49 PM      Component Value Range Comment   Squamous Epithelial / LPF RARE  RARE    WBC, UA 0-2  <3 WBC/hpf    Urine-Other RARE YEAST     GLUCOSE, CAPILLARY     Status:  Abnormal   Collection Time   03/19/12  9:16 PM      Component Value Range Comment   Glucose-Capillary 160 (*) 70 - 99 mg/dL   CBC WITH DIFFERENTIAL     Status: Abnormal   Collection Time   03/20/12  6:50 AM      Component Value Range Comment   WBC 10.0  4.0 - 10.5 K/uL    RBC 3.51 (*) 4.22 - 5.81 MIL/uL    Hemoglobin 9.1 (*) 13.0 - 17.0 g/dL    HCT 40.9 (*) 81.1 - 52.0 %    MCV 81.2  78.0 - 100.0 fL    MCH 25.9 (*) 26.0 - 34.0 pg    MCHC 31.9  30.0 - 36.0 g/dL    RDW 91.4 (*) 78.2 - 15.5 %    Platelets 353  150 - 400 K/uL    Neutrophils Relative 71  43 - 77 %    Neutro Abs 7.1  1.7 - 7.7 K/uL    Lymphocytes Relative 16  12 - 46 %    Lymphs Abs 1.6  0.7 - 4.0 K/uL    Monocytes Relative 10  3 - 12 %    Monocytes Absolute 1.0  0.1 - 1.0 K/uL    Eosinophils Relative 3  0 - 5 %    Eosinophils Absolute 0.3  0.0 - 0.7 K/uL    Basophils Relative 0  0 - 1 %    Basophils Absolute 0.0  0.0 - 0.1 K/uL   COMPREHENSIVE METABOLIC PANEL     Status: Abnormal   Collection Time   03/20/12  6:50 AM      Component Value Range Comment   Sodium 140  135 - 145  mEq/L    Potassium 4.2  3.5 - 5.1 mEq/L    Chloride 102  96 - 112 mEq/L    CO2 27  19 - 32 mEq/L    Glucose, Bld 146 (*) 70 - 99 mg/dL    BUN 11  6 - 23 mg/dL    Creatinine, Ser 9.56  0.50 - 1.35 mg/dL    Calcium 8.7  8.4 - 21.3 mg/dL    Total Protein 6.3  6.0 - 8.3 g/dL    Albumin 2.5 (*) 3.5 - 5.2 g/dL    AST 16  0 - 37 U/L    ALT 19  0 - 53 U/L    Alkaline Phosphatase 65  39 - 117 U/L    Total Bilirubin 0.4  0.3 - 1.2 mg/dL    GFR calc non Af Amer 73 (*) >90 mL/min    GFR calc Af Amer 85 (*) >90 mL/min   GLUCOSE, CAPILLARY     Status: Abnormal   Collection Time   03/20/12  7:11 AM      Component Value Range Comment   Glucose-Capillary 147 (*) 70 - 99 mg/dL    Comment 1 Notify RN     GLUCOSE, CAPILLARY     Status: Normal   Collection Time   03/20/12 11:05 AM      Component Value Range Comment   Glucose-Capillary 95  70 - 99 mg/dL    Comment 1 Notify RN     GLUCOSE, CAPILLARY     Status: Abnormal   Collection Time   03/20/12  4:40 PM      Component Value Range Comment   Glucose-Capillary 110 (*) 70 - 99 mg/dL   GLUCOSE, CAPILLARY     Status: Normal   Collection  Time   03/20/12  9:17 PM      Component Value Range Comment   Glucose-Capillary 89  70 - 99 mg/dL    Comment 1 Notify RN     GLUCOSE, CAPILLARY     Status: Abnormal   Collection Time   03/21/12  7:28 AM      Component Value Range Comment   Glucose-Capillary 100 (*) 70 - 99 mg/dL    Comment 1 Notify RN     GLUCOSE, CAPILLARY     Status: Normal   Collection Time   03/21/12  9:32 AM      Component Value Range Comment   Glucose-Capillary 81  70 - 99 mg/dL    Comment 1 Notify RN     GLUCOSE, CAPILLARY     Status: Abnormal   Collection Time   03/21/12 12:08 PM      Component Value Range Comment   Glucose-Capillary 115 (*) 70 - 99 mg/dL    Comment 1 Notify RN     GLUCOSE, CAPILLARY     Status: Abnormal   Collection Time   03/21/12  4:43 PM      Component Value Range Comment   Glucose-Capillary 123 (*) 70 - 99  mg/dL    Comment 1 Notify RN     GLUCOSE, CAPILLARY     Status: Abnormal   Collection Time   03/21/12  6:07 PM      Component Value Range Comment   Glucose-Capillary 100 (*) 70 - 99 mg/dL    Comment 1 Notify RN     GLUCOSE, CAPILLARY     Status: Abnormal   Collection Time   03/21/12  9:04 PM      Component Value Range Comment   Glucose-Capillary 44 (*) 70 - 99 mg/dL   GLUCOSE, CAPILLARY     Status: Abnormal   Collection Time   03/21/12  9:57 PM      Component Value Range Comment   Glucose-Capillary 124 (*) 70 - 99 mg/dL      HEENT: dentures Cardio: RRR Resp: CTA B/L GI: BS positive Extremity:  No Edema Skin:   Wound C/D/I Neuro: Alert/Oriented, Normal Motor, Abnormal FMC Ataxic/ dec FMC, Reflexes 3+ and Tone  Hypertonia and Tone:  Hypertonia Musc/Skel:  Other scoliosis Cool R distal foot with cyanosis of toes  Assessment/Plan: 1. 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 Physiatrist is providing close team supervision and 24 hour management of active medical problems listed below. Physiatrist and rehab team continue to assess barriers to discharge/monitor patient progress toward functional and medical goals. FIM: FIM - Bathing Bathing Steps Patient Completed: Chest;Right Arm;Abdomen Bathing: 2: Max-Patient completes 3-4 62f 10 parts or 25-49%  FIM - Upper Body Dressing/Undressing Upper body dressing/undressing steps patient completed: Thread/unthread left sleeve of pullover shirt/dress Upper body dressing/undressing: 1: Total-Patient completed less than 25% of tasks FIM - Lower Body Dressing/Undressing Lower body dressing/undressing steps patient completed: Thread/unthread right underwear leg;Thread/unthread left underwear leg;Pull underwear up/down;Thread/unthread right pants leg;Thread/unthread left pants leg;Pull pants up/down;Don/Doff  right sock Lower body dressing/undressing: 1: Total-Patient completed less than 25% of tasks  FIM - Toileting Toileting: 0: Activity did not occur  FIM - Archivist Transfers: 0-Activity did not occur  FIM - Games developer Transfer: 1: Mechanical lift  FIM - Locomotion: Wheelchair Locomotion: Wheelchair: 1: Total Assistance/staff pushes wheelchair (Pt<25%) FIM -  Locomotion: Ambulation Locomotion: Ambulation: 0: Activity did not occur (pt nonambulatory PTA)  Comprehension Comprehension Mode: Auditory Comprehension: 3-Understands basic 50 - 74% of the time/requires cueing 25 - 50%  of the time  Expression Expression Mode: Verbal Expression: 3-Expresses basic 50 - 74% of the time/requires cueing 25 - 50% of the time. Needs to repeat parts of sentences.  Social Interaction Social Interaction: 4-Interacts appropriately 75 - 89% of the time - Needs redirection for appropriate language or to initiate interaction.  Problem Solving Problem Solving: 1-Solves basic less than 25% of the time - needs direction nearly all the time or does not effectively solve problems and may need a restraint for safety  Memory Memory: 1-Recognizes or recalls less than 25% of the time/requires cueing greater than 75% of the time  Medical Problem List and Plan:  1. DVT Prophylaxis/Anticoagulation: Pharmaceutical: Lovenox  2. Pain Management: prn  3. Mood: patient without awareness. Will monitor.  4. Neuropsych: This patient is not capable of making decisions on his/her own behalf.  5. PVD BLE: RLE not symptomatic currently and being monitored.  6. DM type 2-insulin dependent: monitor with AC/HS CBG checks. Continue 70/30 insulin with SSI for elevated BS.  7. MVH:QIONGEX with bid checks.  8.CAD with ICM: continue metoprolol and zocor.  9. MS with paraparesis, cognitive deficits and neurogenic B/B  10. CHF: compensated. Monitor with daily weights    LOS (Days) 3 A FACE TO  FACE EVALUATION WAS PERFORMED  Rogelia Boga 03/22/2012, 8:01 AM

## 2012-03-23 ENCOUNTER — Inpatient Hospital Stay (HOSPITAL_COMMUNITY): Payer: Medicare Other | Admitting: Physical Therapy

## 2012-03-23 ENCOUNTER — Inpatient Hospital Stay (HOSPITAL_COMMUNITY): Payer: Medicare Other | Admitting: Occupational Therapy

## 2012-03-23 ENCOUNTER — Inpatient Hospital Stay (HOSPITAL_COMMUNITY): Payer: Medicare Other

## 2012-03-23 DIAGNOSIS — L98499 Non-pressure chronic ulcer of skin of other sites with unspecified severity: Secondary | ICD-10-CM

## 2012-03-23 DIAGNOSIS — G35 Multiple sclerosis: Secondary | ICD-10-CM

## 2012-03-23 DIAGNOSIS — Z5189 Encounter for other specified aftercare: Secondary | ICD-10-CM

## 2012-03-23 DIAGNOSIS — I739 Peripheral vascular disease, unspecified: Secondary | ICD-10-CM

## 2012-03-23 DIAGNOSIS — S78119A Complete traumatic amputation at level between unspecified hip and knee, initial encounter: Secondary | ICD-10-CM

## 2012-03-23 LAB — GLUCOSE, CAPILLARY
Glucose-Capillary: 68 mg/dL — ABNORMAL LOW (ref 70–99)
Glucose-Capillary: 77 mg/dL (ref 70–99)
Glucose-Capillary: 93 mg/dL (ref 70–99)

## 2012-03-23 NOTE — Progress Notes (Signed)
Patient ID: ICARUS PARTCH, male   DOB: 25-Aug-1940, 71 y.o.   MRN: [id number] Subjective/Complaints: Does not recognize me this am except that I am MD.  Lisabeth Devoid he is at home.  Does remember amputation No pain c/o Review of Systems  All other systems reviewed and are negative.     Objective: Vital Signs: Blood pressure 139/90, pulse 66, temperature 98.2 F (36.8 C), temperature source Oral, resp. rate 18, height 5\' 10"  (1.778 m), weight 74.7 kg (164 lb 10.9 oz), SpO2 100.00%. No results found. Results for orders placed during the hospital encounter of 03/19/12 (from the past 72 hour(s))  GLUCOSE, CAPILLARY     Status: Normal   Collection Time   03/20/12 11:05 AM      Component Value Range Comment   Glucose-Capillary 95  70 - 99 mg/dL    Comment 1 Notify RN     GLUCOSE, CAPILLARY     Status: Abnormal   Collection Time   03/20/12  4:40 PM      Component Value Range Comment   Glucose-Capillary 110 (*) 70 - 99 mg/dL   GLUCOSE, CAPILLARY     Status: Normal   Collection Time   03/20/12  9:17 PM      Component Value Range Comment   Glucose-Capillary 89  70 - 99 mg/dL    Comment 1 Notify RN     GLUCOSE, CAPILLARY     Status: Abnormal   Collection Time   03/21/12  7:28 AM      Component Value Range Comment   Glucose-Capillary 100 (*) 70 - 99 mg/dL    Comment 1 Notify RN     GLUCOSE, CAPILLARY     Status: Normal   Collection Time   03/21/12  9:32 AM      Component Value Range Comment   Glucose-Capillary 81  70 - 99 mg/dL    Comment 1 Notify RN     GLUCOSE, CAPILLARY     Status: Abnormal   Collection Time   03/21/12 12:08 PM      Component Value Range Comment   Glucose-Capillary 115 (*) 70 - 99 mg/dL    Comment 1 Notify RN     GLUCOSE, CAPILLARY     Status: Abnormal   Collection Time   03/21/12  4:43 PM      Component Value Range Comment   Glucose-Capillary 123 (*) 70 - 99 mg/dL    Comment 1 Notify RN     GLUCOSE, CAPILLARY     Status: Abnormal   Collection Time   03/21/12   6:07 PM      Component Value Range Comment   Glucose-Capillary 100 (*) 70 - 99 mg/dL    Comment 1 Notify RN     GLUCOSE, CAPILLARY     Status: Abnormal   Collection Time   03/21/12  9:04 PM      Component Value Range Comment   Glucose-Capillary 44 (*) 70 - 99 mg/dL   GLUCOSE, CAPILLARY     Status: Abnormal   Collection Time   03/21/12  9:57 PM      Component Value Range Comment   Glucose-Capillary 124 (*) 70 - 99 mg/dL   GLUCOSE, CAPILLARY     Status: Normal   Collection Time   03/22/12  7:51 AM      Component Value Range Comment   Glucose-Capillary 72  70 - 99 mg/dL    Comment 1 Notify RN     GLUCOSE, CAPILLARY  Status: Normal   Collection Time   03/22/12 11:40 AM      Component Value Range Comment   Glucose-Capillary 76  70 - 99 mg/dL    Comment 1 Notify RN     GLUCOSE, CAPILLARY     Status: Abnormal   Collection Time   03/22/12  4:39 PM      Component Value Range Comment   Glucose-Capillary 120 (*) 70 - 99 mg/dL    Comment 1 Notify RN     GLUCOSE, CAPILLARY     Status: Normal   Collection Time   03/22/12  9:09 PM      Component Value Range Comment   Glucose-Capillary 76  70 - 99 mg/dL      HEENT: dentures Cardio: RRR Resp: CTA B/L GI: BS positive Extremity:  No Edema Skin:   Wound C/D/I Neuro: Alert/Oriented, Normal Motor, Abnormal FMC Ataxic/ dec FMC, Reflexes 3+ and Tone  Hypertonia and Tone:  Hypertonia Musc/Skel:  Other scoliosis Cool R distal foot with cyanosis of toes, no skin areas  Assessment/Plan: 1. 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 Physiatrist is providing close team supervision and 24 hour management of active medical problems listed below. Physiatrist and rehab team continue to assess barriers to discharge/monitor patient progress toward functional and medical goals. FIM: FIM - Bathing Bathing Steps Patient  Completed: Chest;Right Arm;Abdomen Bathing: 2: Max-Patient completes 3-4 24f 10 parts or 25-49%  FIM - Upper Body Dressing/Undressing Upper body dressing/undressing steps patient completed: Thread/unthread left sleeve of pullover shirt/dress Upper body dressing/undressing: 1: Total-Patient completed less than 25% of tasks FIM - Lower Body Dressing/Undressing Lower body dressing/undressing steps patient completed: Thread/unthread right underwear leg;Thread/unthread left underwear leg;Pull underwear up/down;Thread/unthread right pants leg;Thread/unthread left pants leg;Pull pants up/down;Don/Doff right sock Lower body dressing/undressing: 1: Total-Patient completed less than 25% of tasks  FIM - Toileting Toileting: 0: Activity did not occur  FIM - Archivist Transfers: 0-Activity did not occur  FIM - Games developer Transfer: 1: Two helpers;1: Mechanical lift  FIM - Locomotion: Wheelchair Locomotion: Wheelchair: 2: Travels 50 - 149 ft with minimal assistance (Pt.>75%) (power w/c) FIM - Locomotion: Ambulation Locomotion: Ambulation: 0: Activity did not occur (pt nonambulatory PTA)  Comprehension Comprehension Mode: Auditory Comprehension: 3-Understands basic 50 - 74% of the time/requires cueing 25 - 50%  of the time  Expression Expression Mode: Verbal Expression: 3-Expresses basic 50 - 74% of the time/requires cueing 25 - 50% of the time. Needs to repeat parts of sentences.  Social Interaction Social Interaction: 4-Interacts appropriately 75 - 89% of the time - Needs redirection for appropriate language or to initiate interaction.  Problem Solving Problem Solving: 1-Solves basic less than 25% of the time - needs direction nearly all the time or does not effectively solve problems and may need a restraint for safety  Memory Memory: 1-Recognizes or recalls less than 25% of the time/requires cueing greater than 75% of the time  Medical Problem List and  Plan:  1. DVT Prophylaxis/Anticoagulation: Pharmaceutical: Lovenox  2. Pain Management: prn  3. Mood: patient without awareness. Will monitor.  4. Neuropsych: This patient is not capable of making decisions on his/her own behalf.  5. PVD BLE: RLE not symptomatic currently and being monitored.  6. DM type 2-insulin dependent: monitor with AC/HS CBG checks. Continue 70/30 insulin with SSI for elevated BS.  7. WUJ:[id number]  with bid checks.  8.CAD with ICM: continue metoprolol and zocor.  9. MS with paraparesis, cognitive deficits and neurogenic B/B  10. CHF: compensated. Monitor with daily weights    LOS (Days) 4 A FACE TO FACE EVALUATION WAS PERFORMED  Gionni Freese E 03/23/2012, 7:29 AM

## 2012-03-23 NOTE — Progress Notes (Signed)
Occupational Therapy Session Note  Patient Details  Name: Jaime [name] MRN: [id number] Date of Birth: Jul 03, 1941  Today's Date: 03/23/2012 Time: 0930-1030 Time Calculation (min): 60 min   Skilled Therapeutic Interventions/Progress Updates: Patient missed 30 min of scheduled therapy.  Unable to wake patient.  RN informed.  Patient breathing, but unable to open eyes and participate.       Therapy Documentation Precautions:  Precautions Precautions: Fall Precaution Comments: premorbid cognitive deficits, LLE WAS his "good" leg, MS Restrictions Weight Bearing Restrictions: Yes LLE Weight Bearing: Non weight bearing General: General Amount of Missed OT Time (min): 30 Minutes    Pain: Pain Assessment Pain Score: 0-No pain Faces Pain Scale: No hurt  See FIM for current functional status  Therapy/Group: Individual Therapy  [name] 03/23/2012, 2:05 PM

## 2012-03-23 NOTE — Progress Notes (Signed)
Occupational Therapy Session Note  Patient Details  Name: Jaime [name] MRN: [id number] Date of Birth: [birth year]  Today's Date: 03/23/2012 Time: 9147-8295 Time Calculation (min): 60 min  Short Term Goals: Week 1:  OT Short Term Goal 1 (Week 1): Wife will perform bathing at bed level with Max Assist.  OT Short Term Goal 2 (Week 1): Wife will perform dressing at bed level with Max Assist.  OT Short Term Goal 3 (Week 1): Wife will assist patient with performing grooming at sink level wth Min Assist.  OT Short Term Goal 4 (Week 1): Marland Kitchen OT Short Term Goal 5 (Week 1): Marland Kitchen  Skilled Therapeutic Interventions/Progress Updates:  Wife present during ADL this AM. Therapist performed family training during ADL and had wife lead bathing and dressing as she did at home. Therapist provided tips and techniques when appropriate. Patient sponge bathed in bed at home. Wife stated that patient would help more with his UB bathing, but since he's been here he hasn't been able to. Patient will have a CT today. Patient's incision was inspected and noted to be draining. Nurse was made aware. Fresh gauze was applied and sockete was replaced. Wife assisted with hoyer lift transfer.   Therapy Documentation Precautions:  Precautions Precautions: Fall Precaution Comments: premorbid cognitive deficits, LLE WAS his "good" leg, MS Restrictions Weight Bearing Restrictions: Yes LLE Weight Bearing: Non weight bearing Pain: Pain Assessment Pain Assessment: No/denies pain Pain Score: 0-No pain Faces Pain Scale: No hurt  See FIM for current functional status  Therapy/Group: Individual Therapy  Limmie Patricia, OTR/L 03/23/2012, 12:15 PM

## 2012-03-23 NOTE — Progress Notes (Signed)
Recreational Therapy Session Note  Patient Details  Name: Jaime [name] MRN: [id number] Date of Birth: Dec 11, 1940 Today's Date: 03/23/2012  Order received and chart reviewed.  Due to short ELOS, no formal TR eval/treatment plan.  Will continue to monitor pt through team.  [name] 03/23/2012, 11:01 AM

## 2012-03-23 NOTE — Progress Notes (Signed)
Physical Therapy Note  Patient Details  Name: Jaime [name] MRN: [id number] Date of Birth: 1941/06/10 Today's Date: 03/23/2012  9:30-10:30 individual therapy pt denied pain.  Wife and daughter present for family education. Pt was in his wc. Family assisted pt back to bed via hoyer lift with min assist and cues for technique. Discussed cushion and pressure points to rt ishium and sacrum. Plan to call wc rep. From scooter store to have them carve foam and place more gel pads to fit pt more appropriately before dc. they are closed today due to holiday.   [name] 03/23/2012, 12:24 PM

## 2012-03-23 NOTE — Progress Notes (Signed)
Physical Therapy Note  Patient Details  Name: Jaime [name] MRN: [id number] Date of Birth: 01/07/1941 Today's Date: 03/23/2012  15:30-16:15 individual therapy pt denied pain.  Assisted wife in rolling and using hoyer lift to wc. Attempted to call scooter store wc rep. To leave a message about the need for cushion modifications without success. ROHO cushion was placed in wc with a huge improvement in ability to sit OOB longer and less pressure on the rt sacrum and ischium. Also board was placed to allow proper alignment of rt hip on the board instead of the foot rest that causes increased pressure and poor alignment.  [name] 03/23/2012, 4:52 PM

## 2012-03-24 ENCOUNTER — Inpatient Hospital Stay (HOSPITAL_COMMUNITY): Payer: Medicare Other

## 2012-03-24 DIAGNOSIS — Z5189 Encounter for other specified aftercare: Secondary | ICD-10-CM

## 2012-03-24 DIAGNOSIS — I739 Peripheral vascular disease, unspecified: Secondary | ICD-10-CM

## 2012-03-24 DIAGNOSIS — L98499 Non-pressure chronic ulcer of skin of other sites with unspecified severity: Secondary | ICD-10-CM

## 2012-03-24 DIAGNOSIS — G35 Multiple sclerosis: Secondary | ICD-10-CM

## 2012-03-24 DIAGNOSIS — S78119A Complete traumatic amputation at level between unspecified hip and knee, initial encounter: Secondary | ICD-10-CM

## 2012-03-24 LAB — BASIC METABOLIC PANEL
BUN: 15 mg/dL (ref 6–23)
CO2: 23 mEq/L (ref 19–32)
Calcium: 9.1 mg/dL (ref 8.4–10.5)
Creatinine, Ser: 1.2 mg/dL (ref 0.50–1.35)
GFR calc non Af Amer: 60 mL/min — ABNORMAL LOW (ref >90)
Glucose, Bld: 147 mg/dL — ABNORMAL HIGH (ref 70–99)

## 2012-03-24 LAB — GLUCOSE, CAPILLARY: Glucose-Capillary: 105 mg/dL — ABNORMAL HIGH (ref 70–99)

## 2012-03-24 LAB — CBC WITH DIFFERENTIAL/PLATELET
Eosinophils Absolute: 0.1 10*3/uL (ref 0.0–0.7)
Eosinophils Relative: 1 % (ref 0–5)
HCT: 28.8 % — ABNORMAL LOW (ref 39.0–52.0)
Hemoglobin: 9.2 g/dL — ABNORMAL LOW (ref 13.0–17.0)
Lymphocytes Relative: 18 % (ref 12–46)
Lymphs Abs: 1.7 10*3/uL (ref 0.7–4.0)
MCH: 26.7 pg (ref 26.0–34.0)
MCV: 83.5 fL (ref 78.0–100.0)
Monocytes Absolute: 0.8 10*3/uL (ref 0.1–1.0)
Monocytes Relative: 8 % (ref 3–12)
Platelets: 388 10*3/uL (ref 150–400)
RBC: 3.45 MIL/uL — ABNORMAL LOW (ref 4.22–5.81)
WBC: 9.6 10*3/uL (ref 4.0–10.5)

## 2012-03-24 MED ORDER — TRAMADOL HCL 50 MG PO TABS
50.0000 mg | ORAL_TABLET | Freq: Four times a day (QID) | ORAL | Status: DC
  Administered 2012-03-24 (×3): 50 mg via ORAL
  Filled 2012-03-24 (×3): qty 1

## 2012-03-24 MED ORDER — INSULIN ASPART PROT & ASPART (70-30 MIX) 100 UNIT/ML ~~LOC~~ SUSP
7.0000 [IU] | Freq: Every day | SUBCUTANEOUS | Status: DC
  Administered 2012-03-24 – 2012-03-25 (×2): 7 [IU] via SUBCUTANEOUS

## 2012-03-24 MED ORDER — INSULIN ASPART 100 UNIT/ML ~~LOC~~ SOLN
0.0000 [IU] | Freq: Three times a day (TID) | SUBCUTANEOUS | Status: DC
  Administered 2012-03-24: 2 [IU] via SUBCUTANEOUS
  Administered 2012-03-26 – 2012-03-30 (×8): 1 [IU] via SUBCUTANEOUS
  Administered 2012-03-30: 2 [IU] via SUBCUTANEOUS
  Administered 2012-03-30: 1 [IU] via SUBCUTANEOUS

## 2012-03-24 MED ORDER — SODIUM CHLORIDE 0.45 % IV SOLN
INTRAVENOUS | Status: DC
  Administered 2012-03-24: 21:00:00 via INTRAVENOUS

## 2012-03-24 MED ORDER — INSULIN ASPART PROT & ASPART (70-30 MIX) 100 UNIT/ML ~~LOC~~ SUSP
7.0000 [IU] | Freq: Every day | SUBCUTANEOUS | Status: DC
  Administered 2012-03-25: 7 [IU] via SUBCUTANEOUS

## 2012-03-24 MED ORDER — INSULIN ASPART 100 UNIT/ML ~~LOC~~ SOLN: 0.0000 [IU] | Freq: Every day | SUBCUTANEOUS | Status: DC

## 2012-03-24 NOTE — Progress Notes (Signed)
Physical Therapy Session Note  Patient Details  Name: Jaime [name] MRN: [id number] Date of Birth: September 13, 1940  Today's Date: 03/24/2012 Time:0905-1000 and  4098-1191 Time Calculation (min): 55 and 30 min  Short Term Goals: Week 1:  PT Short Term Goal 1 (Week 1): STG same as LTG  Skilled Therapeutic Interventions/Progress Updates:   1st session:   L residual limb sockette with pelvic straps not dry at beginning of session.    Therapist attempted to call [employer] x 3;line  busy.  Discussed with wife w/c fit/comfort problems.  Bed mobility in flat bed for donning sockette, and placing and removing sling , using rails, with min assist.  Wife assisted with all.  Attempted Hoyer lift transfer, but pt agitated and fearful, using bil UEs unsafely, with spasms making lift transfer unsafe.  Wife and Rn reported that pt had just had anti-spasticity meds.  2nd session:  Pt calmer this session, but still became easily agitated and attempted to use bil hands in unsafe positions; he was slowly redirected.  Bed mobility in flat bed with rails for placing sling for Michiel Sites, min assist.  Michiel Sites lift transfer with wife directing, performing all steps except choice of strap color and pumping handle.  Positioned pt in personal power w/c for optimal pressure relief.  Trial foot board helps with foot positioning, but pt would have better wt bearing throughout R thigh if footboard were lower.  Applied pelvic positioning strap.    Spoke by phone with [employer] about pt's w/c and cushion needs at 1530.  Company reports that pt has an outstanding balance, and they cannot open a work order until this is cleared.  Company will not visit pt here at [facility]; they see pts in home only.  Therapist left messages for SW informing her of situation.       Therapy Documentation Precautions:  Precautions Precautions: Fall Precaution Comments: significant bil UE spasms today Restrictions Weight Bearing  Restrictions: Yes LLE Weight Bearing: Non weight bearing   Pain: Pain Assessment Pain Assessment: No/denies pain Pain Score: 0-No pain     See FIM for current functional status  Therapy/Group: Individual Therapy  Janda Cargo 03/24/2012, 12:15 PM

## 2012-03-24 NOTE — Plan of Care (Signed)
Problem: RH BOWEL ELIMINATION Goal: RH STG MANAGE BOWEL W/MEDICATION W/ASSISTANCE STG Manage Bowel with Medication with min Assistance.  Outcome: Not Progressing No BM noted. Laxative given. Awaiting for results.

## 2012-03-24 NOTE — Progress Notes (Signed)
Recreational Therapy Session Note  Patient Details  Name: Jaime [name] MRN: [id number] Date of Birth: 10-23-1940 Today's Date: 03/24/2012  Order received and chart reviewed.  Discussed pt with team and team felt pt not appropriate for TR services at this time.  Will continue to monitor pt through team for future participation as appropriate.  Lavanna Rog 03/24/2012, 8:01 AM

## 2012-03-24 NOTE — Plan of Care (Signed)
Problem: RH Bathing Goal: LTG Patient will bathe with assist, cues/equipment (OT) LTG: Patient will bathe specified number of body parts with assist with/without cues using equipment (position) (OT)  downgraded

## 2012-03-24 NOTE — Progress Notes (Signed)
Patient CBG = 68. Patient asymptomatic. No sign and symptoms of hypoglycemia. Carbonated snack given. Rechecked CBG = 93. MD notified. Will continue to monitor.

## 2012-03-24 NOTE — Plan of Care (Signed)
Problem: RH Dressing Goal: LTG Patient will perform lower body dressing w/assist (OT) LTG: Patient will perform lower body dressing with assist, with/without cues in positioning using equipment (OT)  Downgraded

## 2012-03-24 NOTE — Progress Notes (Signed)
Patient ID: Jaime [name], male   DOB: 1940-10-14, 71 y.o.   MRN: [id number] Subjective/Complaints: Does recognize me this am No pain c/o Review of Systems  All other systems reviewed and are negative.     Objective: Vital Signs: Blood pressure 129/67, pulse 71, temperature 98.2 F (36.8 C), temperature source Oral, resp. rate 19, height 5\' 10"  (1.778 m), weight 74.2 kg (163 lb 9.3 oz), SpO2 97.00%. Ct Head Wo Contrast  03/23/2012  *RADIOLOGY REPORT*  Clinical Data: Confusion post amputation.  CT HEAD WITHOUT CONTRAST  Technique:  Contiguous axial images were obtained from the base of the skull through the vertex without contrast.  Comparison: 08/19/2006  Findings: Atherosclerotic and physiologic intracranial calcifications. Diffuse parenchymal atrophy. Patchy areas of hypoattenuation in deep and periventricular white matter bilaterally. Negative for acute intracranial hemorrhage, mass lesion, acute infarction, midline shift, or mass-effect. Acute infarct may be inapparent on noncontrast CT. Ventricles and sulci symmetric. Bone windows demonstrate no focal lesion.  IMPRESSION:  1. Negative for bleed or other acute intracranial process.  2. Atrophy and nonspecific white matter changes.   Original Report Authenticated By: Osa Craver, M.D.    Results for orders placed during the hospital encounter of 03/19/12 (from the past 72 hour(s))  GLUCOSE, CAPILLARY     Status: Abnormal   Collection Time   03/21/12  7:28 AM      Component Value Range Comment   Glucose-Capillary 100 (*) 70 - 99 mg/dL    Comment 1 Notify RN     GLUCOSE, CAPILLARY     Status: Normal   Collection Time   03/21/12  9:32 AM      Component Value Range Comment   Glucose-Capillary 81  70 - 99 mg/dL    Comment 1 Notify RN     GLUCOSE, CAPILLARY     Status: Abnormal   Collection Time   03/21/12 12:08 PM      Component Value Range Comment   Glucose-Capillary 115 (*) 70 - 99 mg/dL    Comment 1 Notify RN     GLUCOSE,  CAPILLARY     Status: Abnormal   Collection Time   03/21/12  4:43 PM      Component Value Range Comment   Glucose-Capillary 123 (*) 70 - 99 mg/dL    Comment 1 Notify RN     GLUCOSE, CAPILLARY     Status: Abnormal   Collection Time   03/21/12  6:07 PM      Component Value Range Comment   Glucose-Capillary 100 (*) 70 - 99 mg/dL    Comment 1 Notify RN     GLUCOSE, CAPILLARY     Status: Abnormal   Collection Time   03/21/12  9:04 PM      Component Value Range Comment   Glucose-Capillary 44 (*) 70 - 99 mg/dL   GLUCOSE, CAPILLARY     Status: Abnormal   Collection Time   03/21/12  9:57 PM      Component Value Range Comment   Glucose-Capillary 124 (*) 70 - 99 mg/dL   GLUCOSE, CAPILLARY     Status: Normal   Collection Time   03/22/12  7:51 AM      Component Value Range Comment   Glucose-Capillary 72  70 - 99 mg/dL    Comment 1 Notify RN     GLUCOSE, CAPILLARY     Status: Normal   Collection Time   03/22/12 11:40 AM      Component Value Range  Comment   Glucose-Capillary 76  70 - 99 mg/dL    Comment 1 Notify RN     GLUCOSE, CAPILLARY     Status: Abnormal   Collection Time   03/22/12  4:39 PM      Component Value Range Comment   Glucose-Capillary 120 (*) 70 - 99 mg/dL    Comment 1 Notify RN     GLUCOSE, CAPILLARY     Status: Normal   Collection Time   03/22/12  9:09 PM      Component Value Range Comment   Glucose-Capillary 76  70 - 99 mg/dL   GLUCOSE, CAPILLARY     Status: Normal   Collection Time   03/23/12  7:33 AM      Component Value Range Comment   Glucose-Capillary 77  70 - 99 mg/dL    Comment 1 Documented in Chart     GLUCOSE, CAPILLARY     Status: Normal   Collection Time   03/23/12 11:10 AM      Component Value Range Comment   Glucose-Capillary 98  70 - 99 mg/dL    Comment 1 Documented in Chart     GLUCOSE, CAPILLARY     Status: Normal   Collection Time   03/23/12  4:53 PM      Component Value Range Comment   Glucose-Capillary 86  70 - 99 mg/dL    Comment 1 Documented in  Chart      Comment 2 Notify RN     GLUCOSE, CAPILLARY     Status: Abnormal   Collection Time   03/23/12  9:37 PM      Component Value Range Comment   Glucose-Capillary 68 (*) 70 - 99 mg/dL    Comment 1 Notify RN     GLUCOSE, CAPILLARY     Status: Normal   Collection Time   03/23/12 11:33 PM      Component Value Range Comment   Glucose-Capillary 93  70 - 99 mg/dL    Comment 1 Notify RN        HEENT: dentures Cardio: RRR Resp: CTA B/L GI: BS positive Extremity:  No Edema Skin:   Wound C/D/I Neuro: Alert/Oriented, Normal Motor, Abnormal FMC Ataxic/ dec FMC, Reflexes 3+ and Tone  Hypertonia and Tone:  Hypertonia Musc/Skel:  Other scoliosis Cool R distal foot with cyanosis of toes, no skin areas  Assessment/Plan: 1. 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 Physiatrist is providing close team supervision and 24 hour management of active medical problems listed below. Physiatrist and rehab team continue to assess barriers to discharge/monitor patient progress toward functional and medical goals. FIM: FIM - Bathing Bathing Steps Patient Completed: Chest;Right Arm;Left Arm;Abdomen;Front perineal area;Buttocks;Right upper leg;Left upper leg;Right lower leg (including foot) Bathing: 1: Two helpers  FIM - Upper Body Dressing/Undressing Upper body dressing/undressing steps patient completed: Thread/unthread right sleeve of pullover shirt/dresss;Thread/unthread left sleeve of pullover shirt/dress;Put head through opening of pull over shirt/dress;Pull shirt over trunk Upper body dressing/undressing: 1: Two helpers FIM - Lower Body Dressing/Undressing Lower body dressing/undressing steps patient completed: Thread/unthread right pants leg;Thread/unthread left pants leg;Pull pants up/down;Don/Doff right sock Lower body dressing/undressing: 1: Two helpers  FIM  - Toileting Toileting: 0: Activity did not occur  FIM - Archivist Transfers: 0-Activity did not occur  FIM - Games developer Transfer: 1: Mechanical lift;1: Two helpers  FIM -  Locomotion: Wheelchair Locomotion: Wheelchair: 2: Travels 50 - 149 ft with minimal assistance (Pt.>75%) (power w/c) FIM - Locomotion: Ambulation Locomotion: Ambulation: 0: Activity did not occur (pt nonambulatory PTA)  Comprehension Comprehension Mode: Auditory Comprehension: 5-Understands basic 90% of the time/requires cueing < 10% of the time  Expression Expression Mode: Verbal Expression: 4-Expresses basic 75 - 89% of the time/requires cueing 10 - 24% of the time. Needs helper to occlude trach/needs to repeat words.  Social Interaction Social Interaction: 5-Interacts appropriately 90% of the time - Needs monitoring or encouragement for participation or interaction.  Problem Solving Problem Solving: 4-Solves basic 75 - 89% of the time/requires cueing 10 - 24% of the time  Memory Memory: 3-Recognizes or recalls 50 - 74% of the time/requires cueing 25 - 49% of the time  Medical Problem List and Plan:  1. DVT Prophylaxis/Anticoagulation: Pharmaceutical: Lovenox  2. Pain Management: change to tramadol and monitor response hopefully mental acuity will improve 3. Mood: patient without awareness. Will monitor.  4. Neuropsych: This patient is not capable of making decisions on his/her own behalf.  5. PVD BLE: RLE not symptomatic currently and being monitored.  6. DM type 2-insulin dependent: monitor with AC/HS CBG checks. Continue 70/30 insulin with SSI for elevated BS.  7. ZOX:WRUEAVW with bid checks.  8.CAD with ICM: continue metoprolol and zocor.  9. MS with paraparesis, cognitive deficits and neurogenic B/B  10. CHF: compensated. Monitor with daily weights    LOS (Days) 5 A FACE TO FACE EVALUATION WAS PERFORMED  KIRSTEINS,ANDREW E 03/24/2012, 7:22 AM

## 2012-03-24 NOTE — Plan of Care (Signed)
Problem: RH BOWEL ELIMINATION Goal: RH STG MANAGE BOWEL WITH ASSISTANCE STG Manage Bowel with Mod Assistance.  Outcome: Not Progressing No BM noted since 03/21/2012. Laxative given. Awaiting for results.

## 2012-03-24 NOTE — Progress Notes (Signed)
Occupational Therapy Session Note  Patient Details  Name: Jaime [name] MRN: [id number] Date of Birth: 03/23/1941  Today's Date: 03/24/2012 Time: 0800-0900 Time Calculation (min): 60 min  Short Term Goals: Week 1:  OT Short Term Goal 1 (Week 1): Wife will perform bathing at bed level with Max Assist.  OT Short Term Goal 2 (Week 1): Wife will perform dressing at bed level with Max Assist.  OT Short Term Goal 3 (Week 1): Wife will assist patient with performing grooming at sink level wth Min Assist.  OT Short Term Goal 4 (Week 1): Marland Kitchen OT Short Term Goal 5 (Week 1): Marland Kitchen  Skilled Therapeutic Interventions/Progress Updates:  Wife present during ADL this AM. Therapist performed family training during ADL and had wife lead bathing and dressing as she did at home. Patient's cognition is decreased this AM. Patient is having difficulty following commands and staying awake during bathing and dressing. Patient's incision was inspected and noted to be draining.  Fresh gauze was applied. Sockete was still damp from being washed the previous day and was placed in the dryer. Patient was left in bed until sockete is dry.  Therapy Documentation Precautions:  Precautions Precautions: Fall Precaution Comments: premorbid cognitive deficits, LLE WAS his "good" leg, MS Restrictions Weight Bearing Restrictions: Yes LLE Weight Bearing: Non weight bearing Pain: Pain Assessment Pain Assessment: No/denies pain  See FIM for current functional status  Therapy/Group: Individual Therapy  Limmie Patricia, OTR/L 03/24/2012, 8:54 AM

## 2012-03-24 NOTE — Progress Notes (Signed)
Hypoglycemic Event  CBG:61  Treatment: 15 GM carbohydrate snack  Symptoms: None and   Follow-up CBG: Time:1145 CBG Result:105  Possible Reasons for Event: Medication regimen:   Comments/MD notified:yes    Hedy Camara  Remember to initiate Hypoglycemia Order Set & complete

## 2012-03-25 ENCOUNTER — Inpatient Hospital Stay (HOSPITAL_COMMUNITY): Payer: Medicare Other | Admitting: Physical Therapy

## 2012-03-25 ENCOUNTER — Inpatient Hospital Stay (HOSPITAL_COMMUNITY): Payer: Medicare Other

## 2012-03-25 ENCOUNTER — Inpatient Hospital Stay (HOSPITAL_COMMUNITY): Payer: Medicare Other | Admitting: Occupational Therapy

## 2012-03-25 LAB — GLUCOSE, CAPILLARY
Glucose-Capillary: 80 mg/dL (ref 70–99)
Glucose-Capillary: 69 mg/dL — ABNORMAL LOW (ref 70–99)
Glucose-Capillary: 117 mg/dL — ABNORMAL HIGH (ref 70–99)
Glucose-Capillary: 92 mg/dL (ref 70–99)

## 2012-03-25 LAB — BASIC METABOLIC PANEL
CO2: 22 mEq/L (ref 19–32)
Calcium: 8.8 mg/dL (ref 8.4–10.5)
Creatinine, Ser: 1.19 mg/dL (ref 0.50–1.35)
GFR calc Af Amer: 70 mL/min — ABNORMAL LOW (ref >90)
Sodium: 141 mEq/L (ref 135–145)

## 2012-03-25 LAB — URINALYSIS, ROUTINE W REFLEX MICROSCOPIC
Glucose, UA: NEGATIVE mg/dL
Hgb urine dipstick: NEGATIVE
Protein, ur: 30 mg/dL — AB
Urobilinogen, UA: 1 mg/dL (ref 0.0–1.0)

## 2012-03-25 LAB — URINE MICROSCOPIC-ADD ON

## 2012-03-25 MED ORDER — CIPROFLOXACIN HCL 250 MG PO TABS
250.0000 mg | ORAL_TABLET | Freq: Two times a day (BID) | ORAL | Status: DC
  Administered 2012-03-25 – 2012-03-27 (×6): 250 mg via ORAL
  Filled 2012-03-25 (×9): qty 1

## 2012-03-25 MED ORDER — WHITE PETROLATUM GEL
Status: AC
  Administered 2012-03-25: 09:00:00
  Filled 2012-03-25: qty 5

## 2012-03-25 MED ORDER — BACLOFEN 5 MG HALF TABLET
5.0000 mg | ORAL_TABLET | Freq: Two times a day (BID) | ORAL | Status: DC
  Administered 2012-03-25 – 2012-03-31 (×13): 5 mg via ORAL
  Filled 2012-03-25 (×15): qty 1

## 2012-03-25 MED ORDER — HYDROCODONE-ACETAMINOPHEN 5-325 MG PO TABS
1.0000 | ORAL_TABLET | ORAL | Status: DC | PRN
  Administered 2012-03-27 – 2012-03-31 (×9): 1 via ORAL
  Filled 2012-03-25 (×9): qty 1

## 2012-03-25 NOTE — Patient Care Conference (Signed)
Inpatient RehabilitationTeam Conference Note Date: 03/25/2012   Time: 11:10 AM    Patient Name: Jaime [name]      Medical Record Number: [id number]  Date of Birth: May 15, 1941 Sex: Male         Room/Bed: 4003/4003-01 Payor Info: Payor: MEDICARE  Plan: MEDICARE PART A AND B  Product Type: *No Product type*     Admitting Diagnosis: LT AKA,MS  Admit Date/Time:  03/19/2012  2:24 PM Admission Comments: No comment available   Primary Diagnosis:  Unilateral AKA Principal Problem: Unilateral AKA  Patient Active Problem List   Diagnosis Date Noted  . Unilateral AKA 03/19/2012  . Atherosclerotic PVD with ulceration 03/06/2012  . Other disorder of muscle, ligament, and fascia 12/13/2011  . Critical lower limb ischemia 12/13/2011  . CAD (coronary artery disease)   . Carotid artery disease   . Diarrhea 10/23/2010  . SHOULDER PAIN 08/24/2010  . TOBACCO ABUSE 06/07/2010  . CHF 05/22/2010  . UNSPECIFIED SYSTOLIC HEART FAILURE 05/22/2010  . HYPERLIPIDEMIA 07/31/2007  . CARDIOMYOPATHY, ISCHEMIC 04/15/2007  . BPH (benign prostatic hyperplasia) 04/15/2007  . DM 12/18/2006  . Multiple sclerosis 12/18/2006    Expected Discharge Date: Expected Discharge Date: 03/27/12  Team Members Present: Physician: Dr. Claudette Laws Nurse Present: Laural Roes, RN PT Present: Vincente Liberty, PTA;Edman Circle, PT OT Present: Bretta Bang, Verlene Mayer, OT SLP Present: Fae Pippin, SLP Other (Discipline and Name): Charolette Child Coordinator     Current Status/Progress Goal Weekly Team Focus  Medical   Cognition fluctuates in worsens at night, probable UTI, negative CT head  improve orientation  eczema speech is patient therapy   Bowel/Bladder   incontinent bowel/bladder, condom catheter at night, brief during the day and timed toileting, lbm 8/31  continent with timed toileting   toilet q3hours    Swallow/Nutrition/ Hydration             ADL's   Patient sponge bathes at home supine in bed.  Patient is currently Total Assist for bathing and dressing. Transfers are performed with [facility] lift to power w/c.  Goal is to provide family education to wife to be able to continue to bath and dress patient at bed level. LTG: Min Assist for grooming, Max Assist for UB dressing, and Total Assist for bathing and LB dressing with wife providing care. ELOS: 1 week   Family education with wife.   Mobility   min assist rolling; mechanical lift directed by wife; inconsistent w/c mobility supervision  straight path on unit; min assist turns and backing   mechanical lift for basic transfers; supervision power w/c propulsion x 50' in home environemt; wife independent for HEP for ROM; wife independent for positioning pt in bed for pressure relief  family ed, w/c mobility, DME   Communication             Safety/Cognition/ Behavioral Observations            Pain   incision pain relieved by scheduled ultram   less than 4 1-10 scale  monitor for effectiveness of pain medication   Skin   incision with staples, abdominal pad, and shrinker clean dry intact   no new breakdown   no new breakdown       *See Interdisciplinary Assessment and Plan and progress notes for long and short-term goals  Barriers to Discharge: pre-existing paraparesis from MS as well as ataxia in the upper limbs which will complicate his AKA rehabilitation    Possible Resolutions to Barriers:  incorporates  treatment of MS deficits in to overall treatment plan    Discharge Planning/Teaching Needs:  HOme with wife if she is able to provide 24 hour care-can not pump hoyer due to her health issues-pursuing hired assistance.  Aware of NHP option      Team Discussion:  UTI-cognitive issues-unfamilar environment and infection.  Spasms treating with meds.  Family education for wife on hoyer lift-health issues wife unable to pump hoyer pursuing options ie power lift, etc  Revisions to Treatment Plan:     Continued Need for Acute  Rehabilitation Level of Care: The patient requires daily medical management by a physician with specialized training in physical medicine and rehabilitation for the following conditions: Daily direction of a multidisciplinary physical rehabilitation program to ensure safe treatment while eliciting the highest outcome that is of practical value to the patient.: Yes Daily medical management of patient stability for increased activity during participation in an intensive rehabilitation regime.: Yes Daily analysis of laboratory values and/or radiology reports with any subsequent need for medication adjustment of medical intervention for : Post surgical problems;Neurological problems  Jaime [name] 03/25/2012, 3:53 PM

## 2012-03-25 NOTE — Progress Notes (Signed)
Patient had issues with confusion, hallucinations and gross muscle spasms starting late yesterday afternoon.  Wife expressed concerns about MS exacerbation.  Labs done revealed evidence of renal insufficiency with Na -146/GFR 60.  Patient started on IVF for hydration.  Ultram discontinued (was started in am)  UA with evidence of UTI.   Exam: VSS, afebrile Patient oriented to self and place as hospital.  Was able to state that "something happened" Alert, no evidence of tardive dyskinesia  (as reported last night)- does have occasional spasm. Interacts appropriately. Ext:  Right foot cool to touch. L-AKA site with staples intact, serosanguinous drainage from medial aspect where minimal dehiscence noted.  LUE with 2+ edema at hands and forearm due to infiltration of IV.  A/P 1.)  Mental status changes with dyskinesias:  Appears to have resolved this am. Likely  Multifactorial due to UTI, renal insufficieny and /or ultram. Will start cipro for treatment. Hold baclofen till am labs available.

## 2012-03-25 NOTE — Progress Notes (Signed)
Occupational Therapy Session Note  Patient Details  Name: Jaime [name] MRN: [id number] Date of Birth: 1940/12/21  Today's Date: 03/25/2012 Time: 0930-1030 Time Calculation (min): 60 min  Short Term Goals: Week 1:  OT Short Term Goal 1 (Week 1): Wife will perform bathing at bed level with Max Assist.  OT Short Term Goal 2 (Week 1): Wife will perform dressing at bed level with Max Assist.  OT Short Term Goal 3 (Week 1): Wife will assist patient with performing grooming at sink level wth Min Assist.    Skilled Therapeutic Interventions/Progress Updates:  Wife present during ADL this AM. Therapist performed family training during ADL and had wife lead bathing and dressing as she did at home. Patient is having difficulty following commands and staying awake during bathing and dressing. Therapist and wife required to help patient more due to drowsiness. Once patient was in w/c he did wake up and was more alert.  Therapy Documentation Precautions:  Precautions Precautions: Fall Precaution Comments: significant bil UE spasms today Restrictions Weight Bearing Restrictions: Yes LLE Weight Bearing: Non weight bearing Pain: Pain Assessment Pain Assessment: No/denies pain  See FIM for current functional status  Therapy/Group: Individual Therapy  Limmie Patricia, OTR/L 03/25/2012, 11:27 AM

## 2012-03-25 NOTE — Progress Notes (Signed)
Patient ID: Jaime [name], male   DOB: 06-15-1941, 71 y.o.   MRN: [id number]  Subjective/Complaints: Spasms last noc, slept poorly Denies pain this am Review of Systems  All other systems reviewed and are negative.     Objective: Vital Signs: Blood pressure 129/67, pulse 71, temperature 98.2 F (36.8 C), temperature source Oral, resp. rate 19, height 5\' 10"  (1.778 m), weight 74.2 kg (163 lb 9.3 oz), SpO2 97.00%. Ct Head Wo Contrast  03/23/2012  *RADIOLOGY REPORT*  Clinical Data: Confusion post amputation.  CT HEAD WITHOUT CONTRAST  Technique:  Contiguous axial images were obtained from the base of the skull through the vertex without contrast.  Comparison: 08/19/2006  Findings: Atherosclerotic and physiologic intracranial calcifications. Diffuse parenchymal atrophy. Patchy areas of hypoattenuation in deep and periventricular white matter bilaterally. Negative for acute intracranial hemorrhage, mass lesion, acute infarction, midline shift, or mass-effect. Acute infarct may be inapparent on noncontrast CT. Ventricles and sulci symmetric. Bone windows demonstrate no focal lesion.  IMPRESSION:  1. Negative for bleed or other acute intracranial process.  2. Atrophy and nonspecific white matter changes.   Original Report Authenticated By: Osa Craver, M.D.    Results for orders placed during the hospital encounter of 03/19/12 (from the past 72 hour(s))  GLUCOSE, CAPILLARY     Status: Abnormal   Collection Time   03/21/12  7:28 AM      Component Value Range Comment   Glucose-Capillary 100 (*) 70 - 99 mg/dL    Comment 1 Notify RN     GLUCOSE, CAPILLARY     Status: Normal   Collection Time   03/21/12  9:32 AM      Component Value Range Comment   Glucose-Capillary 81  70 - 99 mg/dL    Comment 1 Notify RN     GLUCOSE, CAPILLARY     Status: Abnormal   Collection Time   03/21/12 12:08 PM      Component Value Range Comment   Glucose-Capillary 115 (*) 70 - 99 mg/dL    Comment 1 Notify RN      GLUCOSE, CAPILLARY     Status: Abnormal   Collection Time   03/21/12  4:43 PM      Component Value Range Comment   Glucose-Capillary 123 (*) 70 - 99 mg/dL    Comment 1 Notify RN     GLUCOSE, CAPILLARY     Status: Abnormal   Collection Time   03/21/12  6:07 PM      Component Value Range Comment   Glucose-Capillary 100 (*) 70 - 99 mg/dL    Comment 1 Notify RN     GLUCOSE, CAPILLARY     Status: Abnormal   Collection Time   03/21/12  9:04 PM      Component Value Range Comment   Glucose-Capillary 44 (*) 70 - 99 mg/dL   GLUCOSE, CAPILLARY     Status: Abnormal   Collection Time   03/21/12  9:57 PM      Component Value Range Comment   Glucose-Capillary 124 (*) 70 - 99 mg/dL   GLUCOSE, CAPILLARY     Status: Normal   Collection Time   03/22/12  7:51 AM      Component Value Range Comment   Glucose-Capillary 72  70 - 99 mg/dL    Comment 1 Notify RN     GLUCOSE, CAPILLARY     Status: Normal   Collection Time   03/22/12 11:40 AM      Component  Value Range Comment   Glucose-Capillary 76  70 - 99 mg/dL    Comment 1 Notify RN     GLUCOSE, CAPILLARY     Status: Abnormal   Collection Time   03/22/12  4:39 PM      Component Value Range Comment   Glucose-Capillary 120 (*) 70 - 99 mg/dL    Comment 1 Notify RN     GLUCOSE, CAPILLARY     Status: Normal   Collection Time   03/22/12  9:09 PM      Component Value Range Comment   Glucose-Capillary 76  70 - 99 mg/dL   GLUCOSE, CAPILLARY     Status: Normal   Collection Time   03/23/12  7:33 AM      Component Value Range Comment   Glucose-Capillary 77  70 - 99 mg/dL    Comment 1 Documented in Chart     GLUCOSE, CAPILLARY     Status: Normal   Collection Time   03/23/12 11:10 AM      Component Value Range Comment   Glucose-Capillary 98  70 - 99 mg/dL    Comment 1 Documented in Chart     GLUCOSE, CAPILLARY     Status: Normal   Collection Time   03/23/12  4:53 PM      Component Value Range Comment   Glucose-Capillary 86  70 - 99 mg/dL    Comment 1  Documented in Chart      Comment 2 Notify RN     GLUCOSE, CAPILLARY     Status: Abnormal   Collection Time   03/23/12  9:37 PM      Component Value Range Comment   Glucose-Capillary 68 (*) 70 - 99 mg/dL    Comment 1 Notify RN     GLUCOSE, CAPILLARY     Status: Normal   Collection Time   03/23/12 11:33 PM      Component Value Range Comment   Glucose-Capillary 93  70 - 99 mg/dL    Comment 1 Notify RN        HEENT: dentures Cardio: RRR Resp: CTA B/L GI: BS positive Extremity:  No Edema Skin:   Wound small amt bleeding at medial incision Neuro: Alert/Oriented, Normal Motor, Abnormal FMC Ataxic/ dec FMC, Reflexes 3+ and Tone  Hypertonia and Tone:  Hypertonia Musc/Skel:  Other scoliosis Cool R distal foot with cyanosis of toes, no skin areas  Assessment/Plan: 1. 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 Physiatrist is providing close team supervision and 24 hour management of active medical problems listed below. Physiatrist and rehab team continue to assess barriers to discharge/monitor patient progress toward functional and medical goals. FIM: FIM - Bathing Bathing Steps Patient Completed: Chest;Right Arm;Left Arm;Abdomen;Front perineal area;Buttocks;Right upper leg;Left upper leg;Right lower leg (including foot) Bathing: 1: Two helpers  FIM - Upper Body Dressing/Undressing Upper body dressing/undressing steps patient completed: Thread/unthread right sleeve of pullover shirt/dresss;Thread/unthread left sleeve of pullover shirt/dress;Put head through opening of pull over shirt/dress;Pull shirt over trunk Upper body dressing/undressing: 1: Two helpers FIM - Lower Body Dressing/Undressing Lower body dressing/undressing steps patient completed: Thread/unthread right pants leg;Thread/unthread left pants leg;Pull pants up/down;Don/Doff right sock Lower  body dressing/undressing: 1: Two helpers  FIM - Toileting Toileting: 0: Activity did not occur  FIM - Archivist Transfers: 0-Activity did not occur  FIM - Games developer Transfer:  1: Mechanical lift;1: Two helpers  FIM - Locomotion: Wheelchair Locomotion: Wheelchair: 2: Travels 50 - 149 ft with minimal assistance (Pt.>75%) (power w/c) FIM - Locomotion: Ambulation Locomotion: Ambulation: 0: Activity did not occur (pt nonambulatory PTA)  Comprehension Comprehension Mode: Auditory Comprehension: 5-Understands basic 90% of the time/requires cueing < 10% of the time  Expression Expression Mode: Verbal Expression: 4-Expresses basic 75 - 89% of the time/requires cueing 10 - 24% of the time. Needs helper to occlude trach/needs to repeat words.  Social Interaction Social Interaction: 5-Interacts appropriately 90% of the time - Needs monitoring or encouragement for participation or interaction.  Problem Solving Problem Solving: 4-Solves basic 75 - 89% of the time/requires cueing 10 - 24% of the time  Memory Memory: 3-Recognizes or recalls 50 - 74% of the time/requires cueing 25 - 49% of the time  Medical Problem List and Plan:  1. DVT Prophylaxis/Anticoagulation: Pharmaceutical: Lovenox  2. Pain Management: change to tramadol and monitor response hopefully mental acuity will improve 3. Mood: patient without awareness. Will monitor.  4. Neuropsych: This patient is not capable of making decisions on his/her own behalf.  5. PVD BLE: RLE not symptomatic currently and being monitored.  6. DM type 2-insulin dependent: monitor with AC/HS CBG checks. Continue 70/30 insulin with SSI for elevated BS.  7. ZOX:WRUEAVW with bid checks.  8.CAD with ICM: continue metoprolol and zocor.  9. MS with paraparesis, cognitive deficits and neurogenic B/B  10. CHF: compensated. Monitor with daily weights    LOS (Days) 5 A FACE TO FACE EVALUATION WAS  PERFORMED  Swannie Milius E 03/24/2012, 7:22 AM

## 2012-03-25 NOTE — Progress Notes (Signed)
Social Work Patient ID: Jaime [name], male   DOB: 05-09-1941, 71 y.o.   MRN: [id number] Met with pt and wife to inform of team conference goals and discharge date 9/6.  Aware rehab is for family education.  Wife concerned about the spasms He is having and will talk with MD regarding this.  Discussed hoyer lift and pt's wheelchair.  Wife reports she has paid the last of the bill to [employer]. It would be nice if the scooter company would make adjustments to his w/c cushion to fit him better.  Wife would like [facility] since they have had them Before and will contact [facility] regarding any assistance with a power lift for pt.  Continue to work toward discharge Friday.

## 2012-03-25 NOTE — Progress Notes (Signed)
Inpatient Diabetes Program Recommendations  AACE/ADA: New Consensus Statement on Inpatient Glycemic Control (2013)  Target Ranges:  Prepandial:   less than 140 mg/dL      Peak postprandial:   less than 180 mg/dL (1-2 hours)      Critically ill patients:  140 - 180 mg/dL   Reason for Visit: Results for Jaime [name], Jaime [name] (MRN [id number]) as of 03/25/2012 10:55  Ref. Range 03/24/2012 11:07 03/24/2012 11:44 03/24/2012 16:34 03/24/2012 20:21 03/25/2012 07:07  Glucose-Capillary Latest Range: 70-99 mg/dL 61 (L) 409 (H) 811 (H) 89 80   Low CBG's. Agree with decreased 70/30.  May also consider holding Novolog correction. Will follow.

## 2012-03-25 NOTE — Progress Notes (Signed)
Hypoglycemic Event  CBG: 69  Treatment: 15 GM carbohydrate snack  Symptoms: None  Follow-up CBG: Time:1144 CBG Result:83   Possible Reasons for Event: Medication regimen:   Comments/MD notified:Pam Love, PA    Hedy Camara  Remember to initiate Hypoglycemia Order Set & complete

## 2012-03-25 NOTE — Progress Notes (Signed)
Social Work Patient ID: Jaime [name], male   DOB: Apr 19, 1941, 71 y.o.   MRN: [id number] Met with wife who feels she is unable to provide the care pt requires at this time.  She is concerned by his spasms and cognitive issues. She has health issues of her own and feels she is declining since pt's admission.  Will work on [employer] and remove pt from discharge board. Wife needs to look out for her own health.

## 2012-03-25 NOTE — Progress Notes (Signed)
Occupational Therapy Session Note  Patient Details  Name: Jaime [name] MRN: [id number] Date of Birth: 08-29-1940  Today's Date: 03/25/2012 Time: 4098-1191 Time Calculation (min): 29 min   Skilled Therapeutic Interventions/Progress Updates:    Pt's daughter present for session.  Began education on hoyer transfers from bed to electric wheelchair.  Pt needs total assist for rolling to place hoyer pad, both to the left and right sides.  Total +2 for transfer.  Pt's daughter steered the hoyer lift while therapist positioned the pt back in to the wheelchair as it was lowered.      Therapy Documentation Precautions:  Precautions Precautions: Fall Precaution Comments: significant bil UE spasms today Restrictions Weight Bearing Restrictions: Yes LLE Weight Bearing: Non weight bearing  Pain: Pain Assessment Pain Assessment: Faces Faces Pain Scale: Hurts little more Pain Type: Surgical pain Pain Location: Leg Pain Orientation: Left Pain Intervention(s): Repositioned   See FIM for current functional status  Therapy/Group: Individual Therapy  Skyelar Swigart OTR/L 03/25/2012, 3:50 PM

## 2012-03-25 NOTE — Progress Notes (Signed)
Physical Therapy Note  Patient Details  Name: Jaime [name] MRN: [id number] Date of Birth: 10-Jul-1941 Today's Date: 03/25/2012  13:00-13:40 individual therapy pt denied pain at rest.  Pt declined out of bed. Daughter was taught and returned demonstration for ROM, stretching, and pressure relief positioning to prevent heel ulcer.  15:15-16:00 individual therapy pt having spasms.  Discussed cushion. Recommended that HHPT  Should be present if wife chooses to have scooter store rep. Fix cushion or that therapist has been scheduled to assist with trimming foam and adding gel to improve seating and pressure relief. Assisted pt back to bed and in pressure relief position with hoyer.   [name] 03/25/2012, 1:50 PM

## 2012-03-26 ENCOUNTER — Inpatient Hospital Stay (HOSPITAL_COMMUNITY): Payer: Medicare Other

## 2012-03-26 ENCOUNTER — Inpatient Hospital Stay (HOSPITAL_COMMUNITY): Payer: Medicare Other | Admitting: Occupational Therapy

## 2012-03-26 ENCOUNTER — Inpatient Hospital Stay (HOSPITAL_COMMUNITY): Payer: Medicare Other | Admitting: Physical Therapy

## 2012-03-26 DIAGNOSIS — N390 Urinary tract infection, site not specified: Secondary | ICD-10-CM | POA: Diagnosis not present

## 2012-03-26 LAB — GLUCOSE, CAPILLARY
Glucose-Capillary: 114 mg/dL — ABNORMAL HIGH (ref 70–99)
Glucose-Capillary: 122 mg/dL — ABNORMAL HIGH (ref 70–99)
Glucose-Capillary: 114 mg/dL — ABNORMAL HIGH (ref 70–99)

## 2012-03-26 LAB — URINE CULTURE: Colony Count: >=100000

## 2012-03-26 MED ORDER — INSULIN ASPART PROT & ASPART (70-30 MIX) 100 UNIT/ML ~~LOC~~ SUSP
4.0000 [IU] | Freq: Every day | SUBCUTANEOUS | Status: DC
  Administered 2012-03-26 – 2012-03-31 (×6): 4 [IU] via SUBCUTANEOUS

## 2012-03-26 MED ORDER — AMANTADINE HCL 100 MG PO CAPS
100.0000 mg | ORAL_CAPSULE | Freq: Three times a day (TID) | ORAL | Status: DC
  Administered 2012-03-26 (×3): 100 mg via ORAL
  Filled 2012-03-26 (×7): qty 1

## 2012-03-26 MED ORDER — INSULIN ASPART PROT & ASPART (70-30 MIX) 100 UNIT/ML ~~LOC~~ SUSP
4.0000 [IU] | Freq: Every day | SUBCUTANEOUS | Status: DC
  Administered 2012-03-26 – 2012-03-30 (×5): 4 [IU] via SUBCUTANEOUS

## 2012-03-26 NOTE — Progress Notes (Signed)
Physical Therapy Session Note  Patient Details  Name: Jaime [name] MRN: [id number] Date of Birth: 1941/03/11  Today's Date: 03/26/2012 Time: 1304-1400 Time Calculation (min): 56 min  Short Term Goals: Week 1:  PT Short Term Goal 1 (Week 1): STG same as LTG  Skilled Therapeutic Interventions/Progress Updates:   Pt seen to assess positioning in power w/c due to pt reporting that he is not comfortable sitting in the w/c due to the cushion.  Pt has had this w/c for almost 3 years and wife reports that his scoliosis has gotten progressively worse.  Pt has a severe concave curve to the right causing all of his weight to be on his right hip with his ribs/trunk extending over the lateral border of the w/c back.  Pictures were taken of pt sitting in w/c.  (Will try to see if they can be entered into the chart)  Placed 2 additional gel packs under the right hip to increase the cushion and placed a towel roll along right lateral back to help increase support to the right lateral trunk.  Placed towel roll next to right thigh to position in alignment.  Note that pt's pelvic obliquity does not allow the pt's foot to sit on the foot rests that are placed in the midline of the w/c a board has been attached to extend the foot rests laterally to accommodate the pt's foot position. Pt needs a w/c seating evaluation to determine appropriate changes to equipment to accommodate the pt's changes in his spine.  If this does not occur pt's spine will continue to progress which will result in further medical complications, related to pressure sores, respiratory and digestive issues.   Therapy Documentation Precautions:  Precautions Precautions: Fall Precaution Comments: significant bil UE spasms today Restrictions Weight Bearing Restrictions: Yes LLE Weight Bearing: Non weight bearing Pain:  No pain   See FIM for current functional status  Therapy/Group: Individual Therapy  Georges Mouse 03/26/2012, 3:42  PM

## 2012-03-26 NOTE — Progress Notes (Signed)
Social Work Patient ID: Jaime [name], male   DOB: 1941/01/21, 71 y.o.   MRN: [id number] Met with wife who reports pt is having a better day, but still feels at this time he is too much care for her.  She realizes in a familiar environment he cognitively may be Better but he is too much physical care at this time for her.  This worker is pursuing short term NHP.  Fl2 completed and sent out to area facilities.  Encouraged wife  To visit facilities.  She is here going through therapies with pt.

## 2012-03-26 NOTE — Progress Notes (Signed)
Patient ID: Jaime [name], male   DOB: September 07, 1940, 71 y.o.   MRN: [id number]  Subjective/Complaints: Tired sleeping but arouses to voice and exam  Review of Systems  Unable to perform ROS: mental acuity  Neurological: Positive for weakness.  Psychiatric/Behavioral: Positive for memory loss.     Objective: Vital Signs: Blood pressure 156/86, pulse 102, temperature 98 F (36.7 C), temperature source Oral, resp. rate 20, height 5\' 10"  (1.778 m), weight 76.4 kg (168 lb 6.9 oz), SpO2 100.00%. No results found. Results for orders placed during the hospital encounter of 03/19/12 (from the past 72 hour(s))  GLUCOSE, CAPILLARY     Status: Normal   Collection Time   03/23/12  7:33 AM      Component Value Range Comment   Glucose-Capillary 77  70 - 99 mg/dL    Comment 1 Documented in Chart     GLUCOSE, CAPILLARY     Status: Normal   Collection Time   03/23/12 11:10 AM      Component Value Range Comment   Glucose-Capillary 98  70 - 99 mg/dL    Comment 1 Documented in Chart     GLUCOSE, CAPILLARY     Status: Normal   Collection Time   03/23/12  4:53 PM      Component Value Range Comment   Glucose-Capillary 86  70 - 99 mg/dL    Comment 1 Documented in Chart      Comment 2 Notify RN     GLUCOSE, CAPILLARY     Status: Abnormal   Collection Time   03/23/12  9:37 PM      Component Value Range Comment   Glucose-Capillary 68 (*) 70 - 99 mg/dL    Comment 1 Notify RN     GLUCOSE, CAPILLARY     Status: Normal   Collection Time   03/23/12 11:33 PM      Component Value Range Comment   Glucose-Capillary 93  70 - 99 mg/dL    Comment 1 Notify RN     GLUCOSE, CAPILLARY     Status: Abnormal   Collection Time   03/24/12  7:22 AM      Component Value Range Comment   Glucose-Capillary 123 (*) 70 - 99 mg/dL    Comment 1 Notify RN     GLUCOSE, CAPILLARY     Status: Abnormal   Collection Time   03/24/12 11:07 AM      Component Value Range Comment   Glucose-Capillary 61 (*) 70 - 99 mg/dL    Comment 1 Notify  RN     GLUCOSE, CAPILLARY     Status: Abnormal   Collection Time   03/24/12 11:44 AM      Component Value Range Comment   Glucose-Capillary 105 (*) 70 - 99 mg/dL   GLUCOSE, CAPILLARY     Status: Abnormal   Collection Time   03/24/12  4:34 PM      Component Value Range Comment   Glucose-Capillary 155 (*) 70 - 99 mg/dL    Comment 1 Notify RN     CBC WITH DIFFERENTIAL     Status: Abnormal   Collection Time   03/24/12  6:07 PM      Component Value Range Comment   WBC 9.6  4.0 - 10.5 K/uL    RBC 3.45 (*) 4.22 - 5.81 MIL/uL    Hemoglobin 9.2 (*) 13.0 - 17.0 g/dL    HCT 44.0 (*) 10.2 - 52.0 %    MCV 83.5  78.0 - 100.0 fL    MCH 26.7  26.0 - 34.0 pg    MCHC 31.9  30.0 - 36.0 g/dL    RDW 13.0 (*) 86.5 - 15.5 %    Platelets 388  150 - 400 K/uL    Neutrophils Relative 72  43 - 77 %    Neutro Abs 6.9  1.7 - 7.7 K/uL    Lymphocytes Relative 18  12 - 46 %    Lymphs Abs 1.7  0.7 - 4.0 K/uL    Monocytes Relative 8  3 - 12 %    Monocytes Absolute 0.8  0.1 - 1.0 K/uL    Eosinophils Relative 1  0 - 5 %    Eosinophils Absolute 0.1  0.0 - 0.7 K/uL    Basophils Relative 0  0 - 1 %    Basophils Absolute 0.0  0.0 - 0.1 K/uL   BASIC METABOLIC PANEL     Status: Abnormal   Collection Time   03/24/12  6:07 PM      Component Value Range Comment   Sodium 146 (*) 135 - 145 mEq/L    Potassium 3.9  3.5 - 5.1 mEq/L    Chloride 109  96 - 112 mEq/L    CO2 23  19 - 32 mEq/L    Glucose, Bld 147 (*) 70 - 99 mg/dL    BUN 15  6 - 23 mg/dL    Creatinine, Ser 7.84  0.50 - 1.35 mg/dL    Calcium 9.1  8.4 - 69.6 mg/dL    GFR calc non Af Amer 60 (*) >90 mL/min    GFR calc Af Amer 69 (*) >90 mL/min   GLUCOSE, CAPILLARY     Status: Normal   Collection Time   03/24/12  8:21 PM      Component Value Range Comment   Glucose-Capillary 89  70 - 99 mg/dL    Comment 1 Notify RN     URINALYSIS, ROUTINE W REFLEX MICROSCOPIC     Status: Abnormal   Collection Time   03/25/12  2:30 AM      Component Value Range Comment   Color,  Urine AMBER (*) YELLOW BIOCHEMICALS MAY BE AFFECTED BY COLOR   APPearance CLOUDY (*) CLEAR    Specific Gravity, Urine 1.026  1.005 - 1.030    pH 5.0  5.0 - 8.0    Glucose, UA NEGATIVE  NEGATIVE mg/dL    Hgb urine dipstick NEGATIVE  NEGATIVE    Bilirubin Urine SMALL (*) NEGATIVE    Ketones, ur NEGATIVE  NEGATIVE mg/dL    Protein, ur 30 (*) NEGATIVE mg/dL    Urobilinogen, UA 1.0  0.0 - 1.0 mg/dL    Nitrite NEGATIVE  NEGATIVE    Leukocytes, UA LARGE (*) NEGATIVE   URINE CULTURE     Status: Normal (Preliminary result)   Collection Time   03/25/12  2:30 AM      Component Value Range Comment   Specimen Description URINE, CLEAN CATCH      Special Requests NONE      Culture  Setup Time 03/25/2012 04:56      Colony Count PENDING      Culture Culture reincubated for better growth      Report Status PENDING     URINE MICROSCOPIC-ADD ON     Status: Abnormal   Collection Time   03/25/12  2:30 AM      Component Value Range Comment   Squamous Epithelial / LPF MANY (*)  RARE    WBC, UA 21-50  <3 WBC/hpf    RBC / HPF 3-6  <3 RBC/hpf    Bacteria, UA MANY (*) RARE    Casts HYALINE CASTS (*) NEGATIVE    Urine-Other LESS THAN 10 mL OF URINE SUBMITTED   MUCOUS PRESENT  GLUCOSE, CAPILLARY     Status: Normal   Collection Time   03/25/12  7:07 AM      Component Value Range Comment   Glucose-Capillary 80  70 - 99 mg/dL    Comment 1 Notify RN     BASIC METABOLIC PANEL     Status: Abnormal   Collection Time   03/25/12  8:40 AM      Component Value Range Comment   Sodium 141  135 - 145 mEq/L    Potassium 3.8  3.5 - 5.1 mEq/L    Chloride 106  96 - 112 mEq/L    CO2 22  19 - 32 mEq/L    Glucose, Bld 116 (*) 70 - 99 mg/dL    BUN 17  6 - 23 mg/dL    Creatinine, Ser 4.09  0.50 - 1.35 mg/dL    Calcium 8.8  8.4 - 81.1 mg/dL    GFR calc non Af Amer 60 (*) >90 mL/min    GFR calc Af Amer 70 (*) >90 mL/min   GLUCOSE, CAPILLARY     Status: Abnormal   Collection Time   03/25/12 11:11 AM      Component Value Range  Comment   Glucose-Capillary 69 (*) 70 - 99 mg/dL   GLUCOSE, CAPILLARY     Status: Abnormal   Collection Time   03/25/12  4:37 PM      Component Value Range Comment   Glucose-Capillary 117 (*) 70 - 99 mg/dL    Comment 1 Notify RN     GLUCOSE, CAPILLARY     Status: Normal   Collection Time   03/25/12  8:23 PM      Component Value Range Comment   Glucose-Capillary 92  70 - 99 mg/dL    Comment 1 Notify RN        HEENT: normal Cardio: RRR Resp: CTA B/L GI: BS positive Extremity:  Cyanosis Skin:   Wound C/D/I Neuro: Confused, Normal Sensory, Abnormal Motor 2/5 R LE, 3- L HF, 4/5 in UUS and Abnormal FMC Ataxic/ dec FMC Musc/Skel:  Other L AKA   Assessment/Plan: 1. 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 Physiatrist is providing close team supervision and 24 hour management of active medical problems listed below. Physiatrist and rehab team continue to assess barriers to discharge/monitor patient progress toward functional and medical goals. FIM: FIM - Bathing Bathing Steps Patient Completed: Chest;Right Arm;Left Arm;Abdomen;Front perineal area;Buttocks;Right upper leg;Left upper leg;Right lower leg (including foot) Bathing: 1: Total-Patient completes 0-2 of 10 parts or less than 25%  FIM - Upper Body Dressing/Undressing Upper body dressing/undressing steps patient completed: Thread/unthread right sleeve of pullover shirt/dresss;Thread/unthread left sleeve of pullover shirt/dress;Put head through opening of pull over shirt/dress;Pull shirt over trunk Upper body dressing/undressing: 1: Two helpers FIM - Lower Body Dressing/Undressing Lower body dressing/undressing steps patient completed: Thread/unthread right pants leg;Thread/unthread left pants leg;Pull pants up/down;Don/Doff right sock Lower body dressing/undressing: 1: Two helpers  FIM -  Toileting Toileting: 1: Two helpers  FIM - Archivist Transfers: 0-Activity did not occur  FIM -  Bed/Chair Transfer Bed/Chair Transfer: 1: Mechanical lift  FIM - Locomotion: Wheelchair Locomotion: Wheelchair: 1: Total Assistance/staff pushes wheelchair (Pt<25%) FIM - Locomotion: Ambulation Locomotion: Ambulation: 0: Activity did not occur  Comprehension Comprehension Mode: Auditory Comprehension: 3-Understands basic 50 - 74% of the time/requires cueing 25 - 50%  of the time  Expression Expression Mode: Verbal Expression: 3-Expresses basic 50 - 74% of the time/requires cueing 25 - 50% of the time. Needs to repeat parts of sentences.  Social Interaction Social Interaction: 4-Interacts appropriately 75 - 89% of the time - Needs redirection for appropriate language or to initiate interaction.  Problem Solving Problem Solving: 2-Solves basic 25 - 49% of the time - needs direction more than half the time to initiate, plan or complete simple activities  Memory Memory: 2-Recognizes or recalls 25 - 49% of the time/requires cueing 51 - 75% of the time  2. Anticoagulation/DVT prophylaxis with Pharmaceutical: Lovenox 3. 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)    POA: Unknown   Assessment: Bleeding around incision monitor, limit pt from removing dressing   Plan: cont drsg changes DM (12/18/2006)    POA: Yes   Assessment: uncontrolled CBG running on low side    Plan: reduce insulin Multiple sclerosis (12/18/2006)    POA: Yes   Assessment: spasticity an issue   Plan: titrate baclofen,symmetrel for fatigue but may reduce dose CAD (coronary artery disease) ()    POA: Yes   Assessment: stable   Plan: cont ASA Atherosclerotic PVD with ulceration (03/06/2012)    POA: Yes   Assessment: R foot cyanotic   Plan: monitor for gangrene UTI Await culture  Mental status reduce amantadine   LOS (Days) 7 A FACE TO FACE  EVALUATION WAS PERFORMED  KIRSTEINS,ANDREW E 03/26/2012, 6:33 AM

## 2012-03-26 NOTE — Plan of Care (Signed)
Hypoglycemic Event  CBG: 54  Treatment: 15 GM carbohydrate snack  Symptoms: None  Follow-up CBG: Time:0740 CBG Result:68  Possible Reasons for Event: Unknown  Comments/MD notified:  Notified Delle Reining, PA.      Jillane Po  Remember to initiate Hypoglycemia Order Set & complete

## 2012-03-26 NOTE — Progress Notes (Signed)
Occupational Therapy Weekly Progress Note  Patient Details  Name: GAVAN NORDBY MRN: [id number] Date of Birth: 11/13/1940  Today's Date: 03/26/2012 Time: 6213-0865 Time Calculation (min): 60 min  Patient has met 0 of 3 short term goals.    Patient continues to demonstrate the following deficits: cognitive deficits and ADL performance and therefore will continue to benefit from skilled OT intervention to enhance overall performance with BADL.  Patient progressing toward long term goals..  Continue plan of care.  OT Short Term Goals Week 1:  OT Short Term Goal 1 (Week 1): Wife will perform bathing at bed level with Max Assist. OT Short Term Goal 1 - Progress (Week 1): Progressing toward goal OT Short Term Goal 2 (Week 1): Wife will perform dressing at bed level with Max Assist.  OT Short Term Goal 2 - Progress (Week 1): Progressing toward goal OT Short Term Goal 3 (Week 1): Wife will assist patient with performing grooming at sink level wth Min Assist.  OT Short Term Goal 3 - Progress (Week 1): Progressing toward goal OT Short Term Goal 4 (Week 1): Marland Kitchen OT Short Term Goal 5 (Week 1): Marland Kitchen Week 2:  OT Short Term Goal 1 (Week 2): STG = LTG  Skilled Therapeutic Interventions/Progress Updates:  ADL session completed at bed level with wife leading bathing and dressing with therapist assisting. Patient was more awake this AM compared to yesterday. Patient required vc's for task completion and task sequencing.   Therapy Documentation Precautions:  Precautions Precautions: Fall Precaution Comments: significant bil UE spasms today Restrictions Weight Bearing Restrictions: Yes LLE Weight Bearing: Non weight bearing Pain: Pain Assessment Pain Assessment: No/denies pain  See FIM for current functional status  Therapy/Group: Individual Therapy  Limmie Patricia, OTR/L 03/26/2012, 12:22 PM

## 2012-03-26 NOTE — Progress Notes (Signed)
Physical Therapy Session Note  Patient Details  Name: Jaime [name] MRN: [id number] Date of Birth: 01/07/1941  Today's Date: 03/26/2012 Time: 0805-0900 Time Calculation (min): 55 min  Short Term Goals: Week 1:  PT Short Term Goal 1 (Week 1): STG same as LTG  Skilled Therapeutic Interventions/Progress Updates:   Bed mobility training for rolling and elevating hips for lateral movements.    Rolling L x 5 x 2, with use of rail in flat bed; supervision> min assist.    Pt expressing some discomfort due to scoliosis.  Rolling R x 5 x 2 without use of rail, flat bed, min assist; with rail, supervision.  Bed mobility for RN to apply dressing to residual limb.    Passive stretching R pelvis/hip  and R rib cage; pt found it comfortable.  Pt positioned using rolled towels under R hip for more symmetrical positionin in supine, pillow under R calf.  Positioning techniques conveyed to nurse tech.  Bed alarm activated.     Therapy Documentation Precautions:  Precautions Precautions: Fall Precaution Comments: significant bil UE spasms today Restrictions Weight Bearing Restrictions: Yes LLE Weight Bearing: Non weight bearing   Vital Signs: Therapy Vitals Temp: 98 F (36.7 C) Temp src: Oral Pulse Rate: 104  Resp:  (dyspneic 3/4 after bed mobility; wheezy after exertion) RN notified BP: 149/86 mmHg Patient Position, if appropriate: Lying Oxygen Therapy SpO2: 100 % O2 Device: None (Room air)       See FIM for current functional status  Therapy/Group: Individual Therapy  Evyn Kooyman 03/26/2012, 3:30 PM

## 2012-03-26 NOTE — Progress Notes (Signed)
Patient had two large stools per therapy and family report.

## 2012-03-26 NOTE — Progress Notes (Signed)
Occupational Therapy Session Note  Patient Details  Name: Jaime [name] MRN: [id number] Date of Birth: Mar 16, 1941  Today's Date: 03/26/2012 Time: 4098-1191 Time Calculation (min): 30 min  Skilled Therapeutic Interventions/Progress Updates:    Helped pt transfer to bed from electric wheelchair via the hoyer lift.  Wife assisted with transfer as far as guiding pt.  She reports not being able to pump up the hoyer lift to raise him out of the chair.  Pt more alert this session, very verbal and able to follow one step commands.  Rolled from supine to the right side with close supervision using rail.  Needed mod assist to roll to the left.  Worked on simulated feeding using spoon and apple sauce in the right hand.  Pt with decreased coordination and shoulder and elbow to bring to mouth.  Would end up bringing to his chin and spilling, even with support at the elbow.  May benefit from trial of weighted utensils to see if this helps coordination.  Also discussed use of mug with handle and lid for drinking.   Provided one for patient to use.  Therapy Documentation Precautions:  Precautions Precautions: Fall Precaution Comments: significant bil UE spasms today Restrictions Weight Bearing Restrictions: Yes LLE Weight Bearing: Non weight bearing  Pain: Pain Assessment Pain Assessment: No/denies pain ADL: See FIM for current functional status  Therapy/Group: Individual Therapy  Cheryl Chay OTR/L 03/26/2012, 4:05 PM

## 2012-03-26 NOTE — Plan of Care (Signed)
Hypoglycemic Event  CBG: 68  Treatment: 15 GM carbohydrate snack, Juice  Symptoms: None  Follow-up CBG: [id number] CBG Result:75  Possible Reasons for Event: Unknown  Comments/MD notified: PA notified    Jaime [name]  Remember to initiate Hypoglycemia Order Set & complete

## 2012-03-27 ENCOUNTER — Inpatient Hospital Stay (HOSPITAL_COMMUNITY): Payer: Medicare Other

## 2012-03-27 LAB — GLUCOSE, CAPILLARY
Glucose-Capillary: 137 mg/dL — ABNORMAL HIGH (ref 70–99)
Glucose-Capillary: 157 mg/dL — ABNORMAL HIGH (ref 70–99)

## 2012-03-27 MED ORDER — AMANTADINE HCL 100 MG PO CAPS
100.0000 mg | ORAL_CAPSULE | Freq: Two times a day (BID) | ORAL | Status: DC
  Administered 2012-03-27 – 2012-03-31 (×9): 100 mg via ORAL
  Filled 2012-03-27 (×9): qty 1

## 2012-03-27 NOTE — Progress Notes (Signed)
Occupational Therapy Discharge Summary  Patient Details  Name: Jaime [name] MRN: [id number] Date of Birth: 1941/07/12  Today's Date: 03/27/2012 Time: 0930-1015 Time Calculation (min): 45 min  Patient has met 4 of 4 long term goals due to improved activity tolerance, improved attention and improved awareness.  Patient to discharge at overall Total Assist level.  Patient's care partner unavailable to provide the necessary physical assistance at discharge.      Recommendation:  Wife completed family education during stay with OT to be able to perform bathing and dressing tasks with patient at bed level as she did at home. Patient will benefit from ongoing skilled OT services in skilled nursing facility setting to continue to advance functional skills in the area of Reduce care partner burden.  Equipment: No equipment provided  Reasons for discharge: patient met maximum potential for rehab,  Patient/family agrees with progress made and goals achieved: Yes  OT Discharge Precautions/Restrictions  Precautions Precautions: Fall Restrictions Weight Bearing Restrictions: Yes LLE Weight Bearing: Non weight bearing Vital Signs Therapy Vitals Pulse Rate: 66  BP: 150/80 mmHg Pain Pain Assessment Pain Assessment: No/denies pain Pain Score:   5 Faces Pain Scale: Hurts a little bit Pain Type: Surgical pain Pain Location: Leg Pain Orientation: Left Pain Descriptors: Sore Pain Onset: On-going Patients Stated Pain Goal: 2 Pain Intervention(s): Medication (See eMAR) ADL ADL Grooming: Minimal assistance;Moderate cueing Where Assessed-Grooming: Sitting at sink Upper Body Bathing: Dependent Where Assessed-Upper Body Bathing: Bed level Lower Body Bathing: Dependent Where Assessed-Lower Body Bathing: Bed level Upper Body Dressing: Maximal assistance Where Assessed-Upper Body Dressing: Bed level Lower Body Dressing: Dependent Where Assessed-Lower Body Dressing: Bed level Toileting:  Unable to assess Toilet Transfer: Unable to assess Tub/Shower Transfer: Unable to assess ADL Comments: patient more alert during ADL and actively following commands 100% as well as using trapeze bar to pull upper body off bed during dressing. Vision/Perception  Vision - History Baseline Vision: Other (comment) (Has glasses but doesn't wear them) Patient Visual Report: No change from baseline  Cognition Overall Cognitive Status: Impaired at baseline Arousal/Alertness: Awake/alert Orientation Level: Oriented to person;Oriented to time;Oriented to place Sensation Sensation Light Touch: Appears Intact Stereognosis: Appears Intact Hot/Cold: Appears Intact Proprioception: Impaired by gross assessment Coordination Gross Motor Movements are Fluid and Coordinated: No Fine Motor Movements are Fluid and Coordinated: No Extremity/Trunk Assessment RUE AROM (degrees) Overall AROM Right Upper Extremity: Within functional limits for tasks performed RUE Strength RUE Overall Strength Comments: MMT: 4-/5 LUE AROM (degrees) Overall AROM Left Upper Extremity: Within functional limits for tasks assessed LUE Strength LUE Overall Strength Comments: MMT: 4-/5  See FIM for current functional status  Skilled Therapeutic Interventions/Progress Updates:  D/C ADL completed with wife performing bathing and dressing with patient at bed level. Wife completed successfully an did not require any vc's during ADL. Patient was alert and assisted wife when able. Patient followed wife's commands 100% during bathing and dressing. Patient is ready for D/C to SNF for further rehab as soon as bed becomes available.   Limmie Patricia, OTR/L 03/27/2012, 11:45 AM

## 2012-03-27 NOTE — Progress Notes (Signed)
Patient ID: Jaime [name], male   DOB: 02/28/1941, 71 y.o.   MRN: [id number]  Subjective/Complaints: Tired sleeping but arouses to voice and exam Oriented to person and [facility] Review of Systems  Unable to perform ROS: mental acuity  Neurological: Positive for weakness.  Psychiatric/Behavioral: Positive for memory loss.     Objective: Vital Signs: Blood pressure 171/92, pulse 104, temperature 98.1 F (36.7 C), temperature source Oral, resp. rate 19, height 5\' 10"  (1.778 m), weight 75.3 kg (166 lb 0.1 oz), SpO2 100.00%. No results found. Results for orders placed during the hospital encounter of 03/19/12 (from the past 72 hour(s))  GLUCOSE, CAPILLARY     Status: Abnormal   Collection Time   03/24/12  7:22 AM      Component Value Range Comment   Glucose-Capillary 123 (*) 70 - 99 mg/dL    Comment 1 Notify RN     GLUCOSE, CAPILLARY     Status: Abnormal   Collection Time   03/24/12 11:07 AM      Component Value Range Comment   Glucose-Capillary 61 (*) 70 - 99 mg/dL    Comment 1 Notify RN     GLUCOSE, CAPILLARY     Status: Abnormal   Collection Time   03/24/12 11:44 AM      Component Value Range Comment   Glucose-Capillary 105 (*) 70 - 99 mg/dL   GLUCOSE, CAPILLARY     Status: Abnormal   Collection Time   03/24/12  4:34 PM      Component Value Range Comment   Glucose-Capillary 155 (*) 70 - 99 mg/dL    Comment 1 Notify RN     CBC WITH DIFFERENTIAL     Status: Abnormal   Collection Time   03/24/12  6:07 PM      Component Value Range Comment   WBC 9.6  4.0 - 10.5 K/uL    RBC 3.45 (*) 4.22 - 5.81 MIL/uL    Hemoglobin 9.2 (*) 13.0 - 17.0 g/dL    HCT 29.5 (*) 62.1 - 52.0 %    MCV 83.5  78.0 - 100.0 fL    MCH 26.7  26.0 - 34.0 pg    MCHC 31.9  30.0 - 36.0 g/dL    RDW 30.8 (*) 65.7 - 15.5 %    Platelets 388  150 - 400 K/uL    Neutrophils Relative 72  43 - 77 %    Neutro Abs 6.9  1.7 - 7.7 K/uL    Lymphocytes Relative 18  12 - 46 %    Lymphs Abs 1.7  0.7 - 4.0 K/uL    Monocytes  Relative 8  3 - 12 %    Monocytes Absolute 0.8  0.1 - 1.0 K/uL    Eosinophils Relative 1  0 - 5 %    Eosinophils Absolute 0.1  0.0 - 0.7 K/uL    Basophils Relative 0  0 - 1 %    Basophils Absolute 0.0  0.0 - 0.1 K/uL   BASIC METABOLIC PANEL     Status: Abnormal   Collection Time   03/24/12  6:07 PM      Component Value Range Comment   Sodium 146 (*) 135 - 145 mEq/L    Potassium 3.9  3.5 - 5.1 mEq/L    Chloride 109  96 - 112 mEq/L    CO2 23  19 - 32 mEq/L    Glucose, Bld 147 (*) 70 - 99 mg/dL    BUN 15  6 -  23 mg/dL    Creatinine, Ser 2.95  0.50 - 1.35 mg/dL    Calcium 9.1  8.4 - 62.1 mg/dL    GFR calc non Af Amer 60 (*) >90 mL/min    GFR calc Af Amer 69 (*) >90 mL/min   GLUCOSE, CAPILLARY     Status: Normal   Collection Time   03/24/12  8:21 PM      Component Value Range Comment   Glucose-Capillary 89  70 - 99 mg/dL    Comment 1 Notify RN     URINALYSIS, ROUTINE W REFLEX MICROSCOPIC     Status: Abnormal   Collection Time   03/25/12  2:30 AM      Component Value Range Comment   Color, Urine AMBER (*) YELLOW BIOCHEMICALS MAY BE AFFECTED BY COLOR   APPearance CLOUDY (*) CLEAR    Specific Gravity, Urine 1.026  1.005 - 1.030    pH 5.0  5.0 - 8.0    Glucose, UA NEGATIVE  NEGATIVE mg/dL    Hgb urine dipstick NEGATIVE  NEGATIVE    Bilirubin Urine SMALL (*) NEGATIVE    Ketones, ur NEGATIVE  NEGATIVE mg/dL    Protein, ur 30 (*) NEGATIVE mg/dL    Urobilinogen, UA 1.0  0.0 - 1.0 mg/dL    Nitrite NEGATIVE  NEGATIVE    Leukocytes, UA LARGE (*) NEGATIVE   URINE CULTURE     Status: Normal   Collection Time   03/25/12  2:30 AM      Component Value Range Comment   Specimen Description URINE, CLEAN CATCH      Special Requests NONE      Culture  Setup Time 03/25/2012 04:56      Colony Count >=100,000 COLONIES/ML      Culture        Value: Multiple bacterial morphotypes present, none predominant. Suggest appropriate recollection if clinically indicated.   Report Status 03/26/2012 FINAL       URINE MICROSCOPIC-ADD ON     Status: Abnormal   Collection Time   03/25/12  2:30 AM      Component Value Range Comment   Squamous Epithelial / LPF MANY (*) RARE    WBC, UA 21-50  <3 WBC/hpf    RBC / HPF 3-6  <3 RBC/hpf    Bacteria, UA MANY (*) RARE    Casts HYALINE CASTS (*) NEGATIVE    Urine-Other LESS THAN 10 mL OF URINE SUBMITTED   MUCOUS PRESENT  GLUCOSE, CAPILLARY     Status: Normal   Collection Time   03/25/12  7:07 AM      Component Value Range Comment   Glucose-Capillary 80  70 - 99 mg/dL    Comment 1 Notify RN     BASIC METABOLIC PANEL     Status: Abnormal   Collection Time   03/25/12  8:40 AM      Component Value Range Comment   Sodium 141  135 - 145 mEq/L    Potassium 3.8  3.5 - 5.1 mEq/L    Chloride 106  96 - 112 mEq/L    CO2 22  19 - 32 mEq/L    Glucose, Bld 116 (*) 70 - 99 mg/dL    BUN 17  6 - 23 mg/dL    Creatinine, Ser 3.08  0.50 - 1.35 mg/dL    Calcium 8.8  8.4 - 65.7 mg/dL    GFR calc non Af Amer 60 (*) >90 mL/min    GFR calc Af Amer 70 (*) >  90 mL/min   GLUCOSE, CAPILLARY     Status: Abnormal   Collection Time   03/25/12 11:11 AM      Component Value Range Comment   Glucose-Capillary 69 (*) 70 - 99 mg/dL   GLUCOSE, CAPILLARY     Status: Abnormal   Collection Time   03/25/12  4:37 PM      Component Value Range Comment   Glucose-Capillary 117 (*) 70 - 99 mg/dL    Comment 1 Notify RN     GLUCOSE, CAPILLARY     Status: Normal   Collection Time   03/25/12  8:23 PM      Component Value Range Comment   Glucose-Capillary 92  70 - 99 mg/dL    Comment 1 Notify RN     GLUCOSE, CAPILLARY     Status: Abnormal   Collection Time   03/26/12  7:13 AM      Component Value Range Comment   Glucose-Capillary 54 (*) 70 - 99 mg/dL    Comment 1 Notify RN     GLUCOSE, CAPILLARY     Status: Abnormal   Collection Time   03/26/12  7:40 AM      Component Value Range Comment   Glucose-Capillary 68 (*) 70 - 99 mg/dL    Comment 1 Notify RN     GLUCOSE, CAPILLARY     Status: Normal    Collection Time   03/26/12  8:14 AM      Component Value Range Comment   Glucose-Capillary 75  70 - 99 mg/dL    Comment 1 Notify RN     GLUCOSE, CAPILLARY     Status: Abnormal   Collection Time   03/26/12 11:21 AM      Component Value Range Comment   Glucose-Capillary 114 (*) 70 - 99 mg/dL   GLUCOSE, CAPILLARY     Status: Abnormal   Collection Time   03/26/12  4:37 PM      Component Value Range Comment   Glucose-Capillary 122 (*) 70 - 99 mg/dL    Comment 1 Documented in Chart      Comment 2 Notify RN     GLUCOSE, CAPILLARY     Status: Abnormal   Collection Time   03/26/12  8:46 PM      Component Value Range Comment   Glucose-Capillary 114 (*) 70 - 99 mg/dL    Comment 1 Notify RN        HEENT: normal Cardio: RRR Resp: CTA B/L GI: BS positive Extremity:  Cyanosis Skin:   Wound C/D/I Neuro: Confused, Normal Sensory, Abnormal Motor 2/5 R LE, 3- L HF, 4/5 in UUS and Abnormal FMC Ataxic/ dec FMC Musc/Skel:  Other L AKA   Assessment/Plan: 1. 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 Physiatrist is providing close team supervision and 24 hour management of active medical problems listed below. Physiatrist and rehab team continue to assess barriers to discharge/monitor patient progress toward functional and medical goals. FIM: FIM - Bathing Bathing Steps Patient Completed: Chest;Right Arm;Left Arm;Abdomen;Front perineal area;Buttocks;Right upper leg;Left upper leg;Right lower leg (including foot) Bathing: 1: Total-Patient completes 0-2 of 10 parts or less than 25%  FIM - Upper Body Dressing/Undressing Upper body dressing/undressing steps patient completed: Thread/unthread right sleeve of pullover shirt/dresss;Thread/unthread left sleeve of pullover shirt/dress;Put head through opening of pull over shirt/dress;Pull shirt over trunk Upper body  dressing/undressing: 1: Two helpers FIM - Lower Body Dressing/Undressing Lower body dressing/undressing steps patient completed: Thread/unthread right pants leg;Thread/unthread left pants leg;Pull pants up/down;Don/Doff right sock Lower body dressing/undressing: 1: Two helpers  FIM - Toileting Toileting: 0: No continent bowel/bladder events this shift  FIM - Archivist Transfers: 0-Activity did not occur  FIM - Games developer Transfer: 1: Mechanical lift  FIM - Locomotion: Wheelchair Locomotion: Wheelchair: 0: Activity did not occur FIM - Locomotion: Ambulation Locomotion: Ambulation: 0: Activity did not occur  Comprehension Comprehension Mode: Auditory Comprehension: 3-Understands basic 50 - 74% of the time/requires cueing 25 - 50%  of the time  Expression Expression Mode: Verbal Expression: 3-Expresses basic 50 - 74% of the time/requires cueing 25 - 50% of the time. Needs to repeat parts of sentences.  Social Interaction Social Interaction: 4-Interacts appropriately 75 - 89% of the time - Needs redirection for appropriate language or to initiate interaction.  Problem Solving Problem Solving: 2-Solves basic 25 - 49% of the time - needs direction more than half the time to initiate, plan or complete simple activities  Memory Memory: 2-Recognizes or recalls 25 - 49% of the time/requires cueing 51 - 75% of the time  2. Anticoagulation/DVT prophylaxis with Pharmaceutical: Lovenox 3. 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

## 2012-03-27 NOTE — Progress Notes (Signed)
Physical Therapy Discharge Summary  Patient Details  Name: Jaime [name] MRN: [id number] Date of Birth: 14-Jun-1941  Today's Date: 03/27/2012 Time: 1105-1205 Time Calculation (min): 60 min  Patient has met 8 of 8 long term goals due to improved activity tolerance and increased strength, decreased confusion.  Patient to discharge at a wheelchair level:  Total Assist for lift transfers, supervision for w/c mobility in personal power w/c.     Patient's care partner is independent to provide the necessary physical assistance, except for operating lever of Hoyer lift,  at discharge.  Reasons goals not met: n/a  Recommendation:  Patient will benefit from ongoing skilled PT services in skilled nursing facility setting to continue to advance safe functional mobility, address ongoing impairments in strength, balance, activity tolerance, postural alignment/positioning in power w/c, and minimize fall risk.  Equipment: additional gel cells added to R side of w/c cushion  Reasons for discharge: awaiting placement at SNF; LTGs met.  Patient/family agrees with progress made and goals achieved: Yes  PT Discharge Precautions/Restrictions Precautions Precautions: Fall Vital Signs   Pain Pain Assessment Pain Assessment: No/denies pain Pain Score:   9 Pain Location: Toe (Comment which one) (phantom pain L foot; R toes) Pain Orientation: Left;Right Pain Intervention(s): Medication (See eMAR) Vision/Perception  Vision - History Baseline Vision: Other (comment) (Has glasses but doesn't wear them) Patient Visual Report: No change from baseline  Cognition Overall Cognitive Status: Impaired at baseline Arousal/Alertness: Awake/alert Orientation Level: Oriented to person;Oriented to time;Oriented to place Sensation Sensation Light Touch: Appears Intact Stereognosis: Appears Intact Hot/Cold: Appears Intact Proprioception: Impaired by gross assessment Coordination Gross Motor Movements are  Fluid and Coordinated: No Fine Motor Movements are Fluid and Coordinated: No Motor  Motor Motor: Abnormal postural alignment and control;Ataxia;Paraplegia Motor - Discharge Observations: due to MS and scoliosis  Mobility Bed Mobility Bed Mobility: Rolling Right;Rolling Left Rolling Right: 5: Supervision (with or without rail) Rolling Right Details: Verbal cues for technique Rolling Left: 5: Supervision (with both hands on rail) Rolling Left Details: Verbal cues for technique Transfers Transfer via Lift Equipment:  Water quality scientist) Locomotion  Ambulation Ambulation: No Gait Gait: No Stairs / Additional Locomotion Stairs: No Corporate treasurer: Yes Wheelchair Assistance:  (backing and turning also) Occupational hygienist: Power Wheelchair Parts Management: Needs assistance Distance: 60  Trunk/Postural Assessment  Cervical Assessment Cervical Assessment: Within Functional Limits Lumbar Assessment Lumbar Assessment: Exceptions to [facility] (increased wt bearing R) Postural Control Trunk Control: poor, d/t fixed trunk malalignment and wekness and abnormal tone and fear   Balance Static Sitting Balance Static Sitting - Balance Support: Bilateral upper extremity supported Static Sitting - Level of Assistance: 1: +2 Total assist (elicits wheezing due to fear of falling) Extremity Assessment  RUE AROM (degrees) Overall AROM Right Upper Extremity: Within functional limits for tasks performed RUE Strength RUE Overall Strength Comments: MMT: 4-/5 LUE AROM (degrees) Overall AROM Left Upper Extremity: Within functional limits for tasks assessed LUE Strength LUE Overall Strength Comments: MMT: 4-/5   RLE Assessment RLE Assessment: Exceptions to [facility] (trace hip flexion/adduction for bed mobility) RLE Strength RLE Overall Strength Comments: tight adductors and ER LLE Assessment LLE Assessment: Exceptions to [facility] LLE Strength LLE Overall Strength Comments: 3/5 hip  flexion  See FIM for current functional status  PT tx today:   Family ed with wife for positioning supine in bed for skin protection and stretching needed to prevent further contractures due to MS/scoliosis.  Rolling R and L 5 x 1 each; to R without  rail, supervision, to L with rail, supervision.  Use of trapeze for scooting up/down, and use of rails for lateral scooting, with supervision.  Hoyer lift transfer, wife performing all but operating crank.  Pt drove power w/c x 60' including turns L and R, navigating near people in hall, backing up and placing w/c next to bed, all with supervision, without cues except to continue when he paused. Wagner Tanzi 03/27/2012, 2:45 PM

## 2012-03-28 ENCOUNTER — Other Ambulatory Visit: Payer: Self-pay | Admitting: Physical Medicine & Rehabilitation

## 2012-03-28 DIAGNOSIS — S78119A Complete traumatic amputation at level between unspecified hip and knee, initial encounter: Secondary | ICD-10-CM

## 2012-03-28 DIAGNOSIS — G35 Multiple sclerosis: Secondary | ICD-10-CM

## 2012-03-28 DIAGNOSIS — L98499 Non-pressure chronic ulcer of skin of other sites with unspecified severity: Secondary | ICD-10-CM

## 2012-03-28 DIAGNOSIS — I739 Peripheral vascular disease, unspecified: Secondary | ICD-10-CM

## 2012-03-28 DIAGNOSIS — Z5189 Encounter for other specified aftercare: Secondary | ICD-10-CM

## 2012-03-28 LAB — GLUCOSE, CAPILLARY
Glucose-Capillary: 128 mg/dL — ABNORMAL HIGH (ref 70–99)
Glucose-Capillary: 122 mg/dL — ABNORMAL HIGH (ref 70–99)
Glucose-Capillary: 157 mg/dL — ABNORMAL HIGH (ref 70–99)

## 2012-03-28 MED ORDER — METOPROLOL SUCCINATE ER 25 MG PO TB24
37.5000 mg | ORAL_TABLET | Freq: Every day | ORAL | Status: DC
  Administered 2012-03-28: 37.5 mg via ORAL
  Administered 2012-03-29: 25 mg via ORAL
  Administered 2012-03-29: 12.5 mg via ORAL
  Administered 2012-03-30 – 2012-03-31 (×2): 37.5 mg via ORAL
  Filled 2012-03-28 (×6): qty 1

## 2012-03-28 MED ORDER — TRAMADOL HCL 50 MG PO TABS: 50.0000 mg | ORAL_TABLET | Freq: Three times a day (TID) | ORAL | Status: DC

## 2012-03-28 NOTE — Progress Notes (Signed)
Patient ID: Jaime [name], male   DOB: 1940-10-31, 71 y.o.   MRN: [id number]  Subjective/Complaints: No problems overnight. Oriented to person and [facility]  Unable to perform ROS: mental acuity  Neurological: Positive for weakness.  Psychiatric/Behavioral: Positive for memory loss.     Objective: Vital Signs: Blood pressure 155/81, pulse 63, temperature 98.1 F (36.7 C), temperature source Oral, resp. rate 18, height 5\' 10"  (1.778 m), weight 75.3 kg (166 lb 0.1 oz), SpO2 98.00%. No results found. Results for orders placed during the hospital encounter of 03/19/12 (from the past 72 hour(s))  GLUCOSE, CAPILLARY     Status: Normal   Collection Time   03/25/12  7:07 AM      Component Value Range Comment   Glucose-Capillary 80  70 - 99 mg/dL    Comment 1 Notify RN     BASIC METABOLIC PANEL     Status: Abnormal   Collection Time   03/25/12  8:40 AM      Component Value Range Comment   Sodium 141  135 - 145 mEq/L    Potassium 3.8  3.5 - 5.1 mEq/L    Chloride 106  96 - 112 mEq/L    CO2 22  19 - 32 mEq/L    Glucose, Bld 116 (*) 70 - 99 mg/dL    BUN 17  6 - 23 mg/dL    Creatinine, Ser 1.19  0.50 - 1.35 mg/dL    Calcium 8.8  8.4 - 14.7 mg/dL    GFR calc non Af Amer 60 (*) >90 mL/min    GFR calc Af Amer 70 (*) >90 mL/min   GLUCOSE, CAPILLARY     Status: Abnormal   Collection Time   03/25/12 11:11 AM      Component Value Range Comment   Glucose-Capillary 69 (*) 70 - 99 mg/dL   GLUCOSE, CAPILLARY     Status: Abnormal   Collection Time   03/25/12  4:37 PM      Component Value Range Comment   Glucose-Capillary 117 (*) 70 - 99 mg/dL    Comment 1 Notify RN     GLUCOSE, CAPILLARY     Status: Normal   Collection Time   03/25/12  8:23 PM      Component Value Range Comment   Glucose-Capillary 92  70 - 99 mg/dL    Comment 1 Notify RN     GLUCOSE, CAPILLARY     Status: Abnormal   Collection Time   03/26/12  7:13 AM      Component Value Range Comment   Glucose-Capillary 54 (*) 70  - 99 mg/dL    Comment 1 Notify RN     GLUCOSE, CAPILLARY     Status: Abnormal   Collection Time   03/26/12  7:40 AM      Component Value Range Comment   Glucose-Capillary 68 (*) 70 - 99 mg/dL    Comment 1 Notify RN     GLUCOSE, CAPILLARY     Status: Normal   Collection Time   03/26/12  8:14 AM      Component Value Range Comment   Glucose-Capillary 75  70 - 99 mg/dL    Comment 1 Notify RN     GLUCOSE, CAPILLARY     Status: Abnormal   Collection Time   03/26/12 11:21 AM      Component Value Range Comment   Glucose-Capillary 114 (*) 70 - 99 mg/dL   GLUCOSE, CAPILLARY  Status: Abnormal   Collection Time   03/26/12  4:37 PM      Component Value Range Comment   Glucose-Capillary 122 (*) 70 - 99 mg/dL    Comment 1 Documented in Chart      Comment 2 Notify RN     GLUCOSE, CAPILLARY     Status: Abnormal   Collection Time   03/26/12  8:46 PM      Component Value Range Comment   Glucose-Capillary 114 (*) 70 - 99 mg/dL    Comment 1 Notify RN     GLUCOSE, CAPILLARY     Status: Normal   Collection Time   03/27/12  8:28 AM      Component Value Range Comment   Glucose-Capillary 84  70 - 99 mg/dL    Comment 1 Notify RN     GLUCOSE, CAPILLARY     Status: Abnormal   Collection Time   03/27/12 11:32 AM      Component Value Range Comment   Glucose-Capillary 137 (*) 70 - 99 mg/dL    Comment 1 Notify RN     GLUCOSE, CAPILLARY     Status: Abnormal   Collection Time   03/27/12  5:01 PM      Component Value Range Comment   Glucose-Capillary 108 (*) 70 - 99 mg/dL   GLUCOSE, CAPILLARY     Status: Abnormal   Collection Time   03/27/12  9:15 PM      Component Value Range Comment   Glucose-Capillary 157 (*) 70 - 99 mg/dL      HEENT: normal Cardio: RRR Resp: CTA B/L GI: BS positive Extremity:  Cyanosis Skin:   Wound C/D/I Neuro: Confused, Normal Sensory, Abnormal Motor 2/5 R LE, 3- L HF, 4/5 in UUS and Abnormal [facility] Ataxic/ dec [facility] Musc/Skel:  Other L AKA Left leg appropriately  tender  Assessment/Plan: 1. 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 Physiatrist is providing close team supervision and 24 hour management of active medical problems listed below. Physiatrist and rehab team continue to assess barriers to discharge/monitor patient progress toward functional and medical goals. FIM: FIM - Bathing Bathing Steps Patient Completed: Chest;Right Arm;Left Arm;Abdomen;Front perineal area;Buttocks;Right upper leg;Left upper leg;Right lower leg (including foot) Bathing: 1: Total-Patient completes 0-2 of 10 parts or less than 25%  FIM - Upper Body Dressing/Undressing Upper body dressing/undressing steps patient completed: Thread/unthread right sleeve of pullover shirt/dresss;Thread/unthread left sleeve of pullover shirt/dress;Put head through opening of pull over shirt/dress;Pull shirt over trunk Upper body dressing/undressing: 2: Max-Patient completed 25-49% of tasks FIM - Lower Body Dressing/Undressing Lower body dressing/undressing steps patient completed: Thread/unthread right pants leg;Thread/unthread left pants leg;Pull pants up/down;Don/Doff right sock Lower body dressing/undressing: 1: Total-Patient completed less than 25% of tasks  FIM - Toileting Toileting: 0: Activity did not occur  FIM - Archivist Transfers: 0-Activity did not occur  FIM - Games developer Transfer: 0: Activity did not occur  FIM - Locomotion: Wheelchair Distance: 60 Locomotion: Wheelchair: 0: Activity did not occur FIM - Locomotion: Ambulation Locomotion: Ambulation: 0: Activity did not occur  Comprehension Comprehension Mode: Auditory Comprehension: 3-Understands basic 50 - 74% of the time/requires cueing 25 - 50%  of the time  Expression Expression Mode: Verbal Expression: 3-Expresses basic 50 - 74% of the  time/requires cueing 25 - 50% of the time. Needs to repeat parts of sentences.  Social Interaction Social Interaction: 4-Interacts appropriately 75 - 89% of the time - Needs redirection for appropriate language or to initiate interaction.  Problem Solving Problem Solving: 2-Solves basic 25 - 49% of the time - needs direction more than half the time to initiate, plan or complete simple activities  Memory Memory: 2-Recognizes or recalls 25 - 49% of the time/requires cueing 51 - 75% of the time  2. Anticoagulation/DVT prophylaxis with Pharmaceutical: Lovenox 3. 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

## 2012-03-29 LAB — GLUCOSE, CAPILLARY: Glucose-Capillary: 110 mg/dL — ABNORMAL HIGH (ref 70–99)

## 2012-03-29 MED ORDER — GABAPENTIN 300 MG PO CAPS
300.0000 mg | ORAL_CAPSULE | Freq: Four times a day (QID) | ORAL | Status: DC
  Administered 2012-03-29 – 2012-03-31 (×9): 300 mg via ORAL
  Filled 2012-03-29 (×13): qty 1

## 2012-03-29 NOTE — Progress Notes (Signed)
Patient ID: Jaime [name], male   DOB: 1940/12/03, 71 y.o.   MRN: [id number]  Subjective/Complaints:  . Enjoyed getting off unit with wife yesterday. Having right foot pain. 'feels like a wire wrapped around it' Oriented to person and [facility]  Unable to perform ROS: mental acuity  Neurological: Positive for weakness.  Psychiatric/Behavioral: Positive for memory loss.     Objective: Vital Signs: Blood pressure 156/98, pulse 79, temperature 98.2 F (36.8 C), temperature source Oral, resp. rate 18, height 5\' 10"  (1.778 m), weight 72.7 kg (160 lb 4.4 oz), SpO2 97.00%. No results found. Results for orders placed during the hospital encounter of 03/19/12 (from the past 72 hour(s))  GLUCOSE, CAPILLARY     Status: Abnormal   Collection Time   03/26/12  7:13 AM      Component Value Range Comment   Glucose-Capillary 54 (*) 70 - 99 mg/dL    Comment 1 Notify RN     GLUCOSE, CAPILLARY     Status: Abnormal   Collection Time   03/26/12  7:40 AM      Component Value Range Comment   Glucose-Capillary 68 (*) 70 - 99 mg/dL    Comment 1 Notify RN     GLUCOSE, CAPILLARY     Status: Normal   Collection Time   03/26/12  8:14 AM      Component Value Range Comment   Glucose-Capillary 75  70 - 99 mg/dL    Comment 1 Notify RN     GLUCOSE, CAPILLARY     Status: Abnormal   Collection Time   03/26/12 11:21 AM      Component Value Range Comment   Glucose-Capillary 114 (*) 70 - 99 mg/dL   GLUCOSE, CAPILLARY     Status: Abnormal   Collection Time   03/26/12  4:37 PM      Component Value Range Comment   Glucose-Capillary 122 (*) 70 - 99 mg/dL    Comment 1 Documented in Chart      Comment 2 Notify RN     GLUCOSE, CAPILLARY     Status: Abnormal   Collection Time   03/26/12  8:46 PM      Component Value Range Comment   Glucose-Capillary 114 (*) 70 - 99 mg/dL    Comment 1 Notify RN     GLUCOSE, CAPILLARY     Status: Normal   Collection Time   03/27/12  8:28 AM      Component Value Range Comment     Glucose-Capillary 84  70 - 99 mg/dL    Comment 1 Notify RN     GLUCOSE, CAPILLARY     Status: Abnormal   Collection Time   03/27/12 11:32 AM      Component Value Range Comment   Glucose-Capillary 137 (*) 70 - 99 mg/dL    Comment 1 Notify RN     GLUCOSE, CAPILLARY     Status: Abnormal   Collection Time   03/27/12  5:01 PM      Component Value Range Comment   Glucose-Capillary 108 (*) 70 - 99 mg/dL   GLUCOSE, CAPILLARY     Status: Abnormal   Collection Time   03/27/12  9:15 PM      Component Value Range Comment   Glucose-Capillary 157 (*) 70 - 99 mg/dL   GLUCOSE, CAPILLARY     Status: Abnormal   Collection Time   03/28/12  9:42 AM      Component Value Range Comment  Glucose-Capillary 128 (*) 70 - 99 mg/dL    Comment 1 Notify RN     GLUCOSE, CAPILLARY     Status: Abnormal   Collection Time   03/28/12 11:14 AM      Component Value Range Comment   Glucose-Capillary 122 (*) 70 - 99 mg/dL    Comment 1 Notify RN     GLUCOSE, CAPILLARY     Status: Abnormal   Collection Time   03/28/12  4:12 PM      Component Value Range Comment   Glucose-Capillary 131 (*) 70 - 99 mg/dL    Comment 1 Notify RN     GLUCOSE, CAPILLARY     Status: Abnormal   Collection Time   03/28/12  9:49 PM      Component Value Range Comment   Glucose-Capillary 157 (*) 70 - 99 mg/dL      HEENT: normal Cardio: RRR Resp: CTA B/L GI: BS positive Extremity:  Cyanosis Skin:   Wound C/D/I Neuro: Confused, Normal Sensory, Abnormal Motor 2/5 R LE, 3- L HF, 4/5 in UUS and Abnormal [facility] Ataxic/ dec [facility] Musc/Skel:  Other L AKA Left leg appropriately tender, right foot warm with palpable pulses, non-tender to touch.  Assessment/Plan: 1. 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 Physiatrist is providing close team supervision and 24 hour management of active medical problems listed  below. Physiatrist and rehab team continue to assess barriers to discharge/monitor patient progress toward functional and medical goals. FIM: FIM - Bathing Bathing Steps Patient Completed: Chest;Right Arm;Left Arm;Abdomen;Front perineal area;Buttocks;Right upper leg;Left upper leg;Right lower leg (including foot) Bathing: 1: Total-Patient completes 0-2 of 10 parts or less than 25%  FIM - Upper Body Dressing/Undressing Upper body dressing/undressing steps patient completed: Thread/unthread right sleeve of pullover shirt/dresss;Thread/unthread left sleeve of pullover shirt/dress;Put head through opening of pull over shirt/dress;Pull shirt over trunk Upper body dressing/undressing: 2: Max-Patient completed 25-49% of tasks FIM - Lower Body Dressing/Undressing Lower body dressing/undressing steps patient completed: Thread/unthread right pants leg;Thread/unthread left pants leg;Pull pants up/down;Don/Doff right sock Lower body dressing/undressing: 1: Total-Patient completed less than 25% of tasks  FIM - Toileting Toileting: 0: Activity did not occur  FIM - Archivist Transfers: 0-Activity did not occur  FIM - Games developer Transfer: 0: Activity did not occur  FIM - Locomotion: Wheelchair Distance: 60 Locomotion: Wheelchair: 0: Activity did not occur FIM - Locomotion: Ambulation Locomotion: Ambulation: 0: Activity did not occur  Comprehension Comprehension Mode: Auditory Comprehension: 3-Understands basic 50 - 74% of the time/requires cueing 25 - 50%  of the time  Expression Expression Mode: Verbal Expression: 3-Expresses basic 50 - 74% of the time/requires cueing 25 - 50% of the time. Needs to repeat parts of sentences.  Social Interaction Social Interaction: 4-Interacts appropriately 75 - 89% of the time - Needs redirection for appropriate language or to initiate interaction.  Problem Solving Problem Solving: 2-Solves basic 25 - 49% of the time - needs  direction more than half the time to initiate, plan or complete simple activities  Memory Memory: 2-Recognizes or recalls 25 - 49% of the time/requires cueing 51 - 75% of the time  2. Anticoagulation/DVT prophylaxis with Pharmaceutical: Lovenox 3. Pain Management: Post op stump norco. Neuropathic pain: increase neurontin to qid. 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

## 2012-03-30 DIAGNOSIS — L98499 Non-pressure chronic ulcer of skin of other sites with unspecified severity: Secondary | ICD-10-CM

## 2012-03-30 DIAGNOSIS — G35 Multiple sclerosis: Secondary | ICD-10-CM

## 2012-03-30 DIAGNOSIS — Z5189 Encounter for other specified aftercare: Secondary | ICD-10-CM

## 2012-03-30 DIAGNOSIS — I739 Peripheral vascular disease, unspecified: Secondary | ICD-10-CM

## 2012-03-30 DIAGNOSIS — S78119A Complete traumatic amputation at level between unspecified hip and knee, initial encounter: Secondary | ICD-10-CM

## 2012-03-30 LAB — GLUCOSE, CAPILLARY
Glucose-Capillary: 180 mg/dL — ABNORMAL HIGH (ref 70–99)
Glucose-Capillary: 245 mg/dL — ABNORMAL HIGH (ref 70–99)
Glucose-Capillary: 116 mg/dL — ABNORMAL HIGH (ref 70–99)

## 2012-03-30 MED ORDER — NORTRIPTYLINE HCL 10 MG PO CAPS
10.0000 mg | ORAL_CAPSULE | Freq: Every day | ORAL | Status: DC
  Administered 2012-03-30: 10 mg via ORAL
  Filled 2012-03-30 (×2): qty 1

## 2012-03-30 NOTE — Progress Notes (Signed)
Patient ID: Jaime [name], male   DOB: 03/25/1941, 71 y.o.   MRN: [id number]  Subjective/Complaints:  Feels depressed with poor appetite and phantom pain Oriented to person and [facility]  Unable to perform ROS: mental acuity  Neurological: Positive for weakness.  Psychiatric/Behavioral: Positive for memory loss.     Objective: Vital Signs: Blood pressure 149/88, pulse 72, temperature 98.9 F (37.2 C), temperature source Oral, resp. rate 20, height 5\' 10"  (1.778 m), weight 70.4 kg (155 lb 3.3 oz), SpO2 100.00%. No results found. Results for orders placed during the hospital encounter of 03/19/12 (from the past 72 hour(s))  GLUCOSE, CAPILLARY     Status: Normal   Collection Time   03/27/12  8:28 AM      Component Value Range Comment   Glucose-Capillary 84  70 - 99 mg/dL    Comment 1 Notify RN     GLUCOSE, CAPILLARY     Status: Abnormal   Collection Time   03/27/12 11:32 AM      Component Value Range Comment   Glucose-Capillary 137 (*) 70 - 99 mg/dL    Comment 1 Notify RN     GLUCOSE, CAPILLARY     Status: Abnormal   Collection Time   03/27/12  5:01 PM      Component Value Range Comment   Glucose-Capillary 108 (*) 70 - 99 mg/dL   GLUCOSE, CAPILLARY     Status: Abnormal   Collection Time   03/27/12  9:15 PM      Component Value Range Comment   Glucose-Capillary 157 (*) 70 - 99 mg/dL   GLUCOSE, CAPILLARY     Status: Abnormal   Collection Time   03/28/12  9:42 AM      Component Value Range Comment   Glucose-Capillary 128 (*) 70 - 99 mg/dL    Comment 1 Notify RN     GLUCOSE, CAPILLARY     Status: Abnormal   Collection Time   03/28/12 11:14 AM      Component Value Range Comment   Glucose-Capillary 122 (*) 70 - 99 mg/dL    Comment 1 Notify RN     GLUCOSE, CAPILLARY     Status: Abnormal   Collection Time   03/28/12  4:12 PM      Component Value Range Comment   Glucose-Capillary 131 (*) 70 - 99 mg/dL    Comment 1 Notify RN     GLUCOSE, CAPILLARY     Status: Abnormal   Collection Time   03/28/12  9:49 PM      Component Value Range Comment   Glucose-Capillary 157 (*) 70 - 99 mg/dL   GLUCOSE, CAPILLARY     Status: Abnormal   Collection Time   03/29/12  7:20 AM      Component Value Range Comment   Glucose-Capillary 126 (*) 70 - 99 mg/dL    Comment 1 Notify RN     GLUCOSE, CAPILLARY     Status: Abnormal   Collection Time   03/29/12 11:18 AM      Component Value Range Comment   Glucose-Capillary 110 (*) 70 - 99 mg/dL    Comment 1 Notify RN     GLUCOSE, CAPILLARY     Status: Abnormal   Collection Time   03/29/12  4:10 PM      Component Value Range Comment   Glucose-Capillary 145 (*) 70 - 99 mg/dL    Comment 1 Notify RN     GLUCOSE, CAPILLARY  Status: Abnormal   Collection Time   03/29/12  8:36 PM      Component Value Range Comment   Glucose-Capillary 127 (*) 70 - 99 mg/dL   GLUCOSE, CAPILLARY     Status: Abnormal   Collection Time   03/30/12  7:06 AM      Component Value Range Comment   Glucose-Capillary 126 (*) 70 - 99 mg/dL    Comment 1 Notify RN        HEENT: normal Cardio: RRR Resp: CTA B/L GI: BS positive Extremity:  Cyanosis Skin:   Wound C/D/I Neuro: Confused, Normal Sensory, Abnormal Motor 2/5 R LE, 3- L HF, 4/5 in UUS and Abnormal [facility] Ataxic/ dec [facility] Musc/Skel:  Other L AKA Left leg appropriately tender, right foot warm with palpable pulses, non-tender to touch.  Assessment/Plan: 1. 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 Physiatrist is providing close team supervision and 24 hour management of active medical problems listed below. Physiatrist and rehab team continue to assess barriers to discharge/monitor patient progress toward functional and medical goals. FIM: FIM - Bathing Bathing Steps Patient Completed: Chest;Right Arm;Left Arm;Abdomen;Front perineal area;Buttocks;Right upper leg;Left upper  leg;Right lower leg (including foot) Bathing: 1: Total-Patient completes 0-2 of 10 parts or less than 25%  FIM - Upper Body Dressing/Undressing Upper body dressing/undressing steps patient completed: Thread/unthread right sleeve of pullover shirt/dresss;Thread/unthread left sleeve of pullover shirt/dress;Put head through opening of pull over shirt/dress;Pull shirt over trunk Upper body dressing/undressing: 2: Max-Patient completed 25-49% of tasks FIM - Lower Body Dressing/Undressing Lower body dressing/undressing steps patient completed: Thread/unthread right pants leg;Thread/unthread left pants leg;Pull pants up/down Lower body dressing/undressing: 1: Two helpers  FIM - Toileting Toileting: 0: Activity did not occur  FIM - Archivist Transfers: 0-Activity did not occur  FIM - Games developer Transfer: 0: Activity did not occur  FIM - Locomotion: Wheelchair Distance: 60 Locomotion: Wheelchair: 0: Activity did not occur FIM - Locomotion: Ambulation Locomotion: Ambulation: 0: Activity did not occur  Comprehension Comprehension Mode: Auditory Comprehension: 3-Understands basic 50 - 74% of the time/requires cueing 25 - 50%  of the time  Expression Expression Mode: Verbal Expression: 3-Expresses basic 50 - 74% of the time/requires cueing 25 - 50% of the time. Needs to repeat parts of sentences.  Social Interaction Social Interaction: 4-Interacts appropriately 75 - 89% of the time - Needs redirection for appropriate language or to initiate interaction.  Problem Solving Problem Solving: 2-Solves basic 25 - 49% of the time - needs direction more than half the time to initiate, plan or complete simple activities  Memory Memory: 2-Recognizes or recalls 25 - 49% of the time/requires cueing 51 - 75% of the time  2. Anticoagulation/DVT prophylaxis with Pharmaceutical: Lovenox 3. Pain Management: Post op stump norco. Neuropathic pain: increase neurontin to  qid.trial nortriptyline Patient Active Hospital Problem List: Unilateral AKA (03/19/2012)    POA: Unknown   Assessment: minimal Bleeding around incision monitor, limit pt from removing dressing- continue local care   Plan: cont drsg changes DM (12/18/2006)    POA: Yes   Assessment:    Plan: reduced insulin- reasonable control yesterday Multiple sclerosis (12/18/2006)    POA: Yes   Assessment: spasticity an issue   Plan: titrate baclofen,symmetrel for fatigue but may reduce dose CAD (coronary artery disease) ()    POA: Yes   Assessment: stable  Plan: cont ASA 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  Gradually increase monitor HR   LOS (Days) 11 A FACE TO FACE EVALUATION WAS PERFORMED  Suzetta Timko E 03/30/2012, 7:12 AM

## 2012-03-30 NOTE — Progress Notes (Signed)
Social Work Patient ID: Jaime [name], male   DOB: 1940/10/01, 71 y.o.   MRN: [id number] Met with to and wife to discuss no offers from SNF and with therapy discharging him form therapies may not be covered with insurance in a NH. Both have decided to try it at home and go from there.  Will now plan for pt to return home tomorrow.  Will get hoyer lift delivered to home and arrange Follow up therapies.  Wife to hire some assist to help at home with pt and daughter down the street.  Wife does not want to pay privately for pt to go to a NH. Have made Dan-PA aware of the plan now.

## 2012-03-31 DIAGNOSIS — I739 Peripheral vascular disease, unspecified: Secondary | ICD-10-CM

## 2012-03-31 DIAGNOSIS — L98499 Non-pressure chronic ulcer of skin of other sites with unspecified severity: Secondary | ICD-10-CM

## 2012-03-31 DIAGNOSIS — G35 Multiple sclerosis: Secondary | ICD-10-CM

## 2012-03-31 DIAGNOSIS — S78119A Complete traumatic amputation at level between unspecified hip and knee, initial encounter: Secondary | ICD-10-CM

## 2012-03-31 DIAGNOSIS — Z5189 Encounter for other specified aftercare: Secondary | ICD-10-CM

## 2012-03-31 MED ORDER — METOPROLOL SUCCINATE 12.5 MG HALF TABLET: 37.5000 mg | ORAL_TABLET | Freq: Every day | ORAL | Status: DC

## 2012-03-31 MED ORDER — INSULIN ASPART PROT & ASPART (70-30 MIX) 100 UNIT/ML ~~LOC~~ SUSP: 4.0000 [IU] | Freq: Every day | SUBCUTANEOUS | Status: DC

## 2012-03-31 MED ORDER — GABAPENTIN 300 MG PO CAPS: 300.0000 mg | ORAL_CAPSULE | Freq: Four times a day (QID) | ORAL | Status: DC

## 2012-03-31 MED ORDER — HYDROCODONE-ACETAMINOPHEN 5-325 MG PO TABS: 1.0000 | ORAL_TABLET | ORAL | Status: AC | PRN

## 2012-03-31 MED ORDER — BACLOFEN 5 MG HALF TABLET: 5.0000 mg | ORAL_TABLET | Freq: Two times a day (BID) | ORAL | Status: DC

## 2012-03-31 MED ORDER — AMANTADINE HCL 100 MG PO CAPS: 100.0000 mg | ORAL_CAPSULE | Freq: Two times a day (BID) | ORAL | Status: DC

## 2012-03-31 MED ORDER — PANTOPRAZOLE SODIUM 40 MG PO TBEC: 40.0000 mg | DELAYED_RELEASE_TABLET | Freq: Every day | ORAL | Status: DC

## 2012-03-31 MED ORDER — NORTRIPTYLINE HCL 10 MG PO CAPS: 10.0000 mg | ORAL_CAPSULE | Freq: Every day | ORAL | Status: DC

## 2012-03-31 NOTE — Progress Notes (Signed)
0945 Pt. Discharged to home with wife at bedside.  VSS; PA provided all discharge instructions via handouts.  No further questions asked.  NT to escort patient to vehicle.  All personal belongings in tow.

## 2012-03-31 NOTE — Progress Notes (Signed)
Patient ID: Jaime [name], male   DOB: October 25, 1940, 71 y.o.   MRN: [id number]  Subjective/Complaints: Phantom pain improved Oriented to person and [facility] Review of Systems  Unable to perform ROS: mental acuity  Neurological: Positive for weakness.  Psychiatric/Behavioral: Positive for memory loss.     Objective: Vital Signs: Blood pressure 139/89, pulse 65, temperature 98.6 F (37 C), temperature source Oral, resp. rate 19, height 5\' 10"  (1.778 m), weight 70.4 kg (155 lb 3.3 oz), SpO2 99.00%. No results found. Results for orders placed during the hospital encounter of 03/19/12 (from the past 72 hour(s))  GLUCOSE, CAPILLARY     Status: Abnormal   Collection Time   03/28/12  9:42 AM      Component Value Range Comment   Glucose-Capillary 128 (*) 70 - 99 mg/dL    Comment 1 Notify RN     GLUCOSE, CAPILLARY     Status: Abnormal   Collection Time   03/28/12 11:14 AM      Component Value Range Comment   Glucose-Capillary 122 (*) 70 - 99 mg/dL    Comment 1 Notify RN     GLUCOSE, CAPILLARY     Status: Abnormal   Collection Time   03/28/12  4:12 PM      Component Value Range Comment   Glucose-Capillary 131 (*) 70 - 99 mg/dL    Comment 1 Notify RN     GLUCOSE, CAPILLARY     Status: Abnormal   Collection Time   03/28/12  9:49 PM      Component Value Range Comment   Glucose-Capillary 157 (*) 70 - 99 mg/dL   GLUCOSE, CAPILLARY     Status: Abnormal   Collection Time   03/29/12  7:20 AM      Component Value Range Comment   Glucose-Capillary 126 (*) 70 - 99 mg/dL    Comment 1 Notify RN     GLUCOSE, CAPILLARY     Status: Abnormal   Collection Time   03/29/12 11:18 AM      Component Value Range Comment   Glucose-Capillary 110 (*) 70 - 99 mg/dL    Comment 1 Notify RN     GLUCOSE, CAPILLARY     Status: Abnormal   Collection Time   03/29/12  4:10 PM      Component Value Range Comment   Glucose-Capillary 145 (*) 70 - 99 mg/dL    Comment 1 Notify RN     GLUCOSE, CAPILLARY     Status: Abnormal   Collection Time   03/29/12  8:36 PM      Component Value Range Comment   Glucose-Capillary 127 (*) 70 - 99 mg/dL   GLUCOSE, CAPILLARY     Status: Abnormal   Collection Time   03/30/12  7:06 AM      Component Value Range Comment   Glucose-Capillary 126 (*) 70 - 99 mg/dL    Comment 1 Notify RN     GLUCOSE, CAPILLARY     Status: Abnormal   Collection Time   03/30/12 11:18 AM      Component Value Range Comment   Glucose-Capillary 180 (*) 70 - 99 mg/dL    Comment 1 Notify RN     GLUCOSE, CAPILLARY     Status: Abnormal   Collection Time   03/30/12  5:11 PM      Component Value Range Comment   Glucose-Capillary 245 (*) 70 - 99 mg/dL   GLUCOSE, CAPILLARY     Status: Abnormal  Collection Time   03/30/12  8:38 PM      Component Value Range Comment   Glucose-Capillary 116 (*) 70 - 99 mg/dL    Comment 1 Notify RN        HEENT: normal Cardio: RRR Resp: CTA B/L GI: BS positive Extremity:  Cyanosis Skin:   Wound C/D/I Neuro: Confused, Normal Sensory, Abnormal Motor 2/5 R LE, 3- L HF, 4/5 in UUS and Abnormal [facility] Ataxic/ dec [facility] Musc/Skel:  Other L AKA , right foot cool with weak pulses, non-tender to touch.  Assessment/Plan: 1. 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 Physiatrist is providing close team supervision and 24 hour management of active medical problems listed below. Physiatrist and rehab team continue to assess barriers to discharge/monitor patient progress toward functional and medical goals. FIM: FIM - Bathing Bathing Steps Patient Completed: Chest;Right Arm;Left Arm;Abdomen;Front perineal area;Buttocks;Right upper leg;Left upper leg;Right lower leg (including foot) Bathing: 1: Total-Patient completes 0-2 of 10 parts or less than 25%  FIM - Upper Body Dressing/Undressing Upper body dressing/undressing steps patient completed: Thread/unthread  right sleeve of pullover shirt/dresss;Thread/unthread left sleeve of pullover shirt/dress;Put head through opening of pull over shirt/dress;Pull shirt over trunk Upper body dressing/undressing: 2: Max-Patient completed 25-49% of tasks FIM - Lower Body Dressing/Undressing Lower body dressing/undressing steps patient completed: Thread/unthread right pants leg;Thread/unthread left pants leg;Pull pants up/down Lower body dressing/undressing: 1: Two helpers  FIM - Toileting Toileting: 0: Activity did not occur  FIM - Archivist Transfers: 0-Activity did not occur  FIM - Games developer Transfer: 0: Activity did not occur  FIM - Locomotion: Wheelchair Distance: 60 Locomotion: Wheelchair: 0: Activity did not occur FIM - Locomotion: Ambulation Locomotion: Ambulation: 0: Activity did not occur  Comprehension Comprehension Mode: Auditory Comprehension: 3-Understands basic 50 - 74% of the time/requires cueing 25 - 50%  of the time  Expression Expression Mode: Verbal Expression: 3-Expresses basic 50 - 74% of the time/requires cueing 25 - 50% of the time. Needs to repeat parts of sentences.  Social Interaction Social Interaction: 4-Interacts appropriately 75 - 89% of the time - Needs redirection for appropriate language or to initiate interaction.  Problem Solving Problem Solving: 2-Solves basic 25 - 49% of the time - needs direction more than half the time to initiate, plan or complete simple activities  Memory Memory: 2-Recognizes or recalls 25 - 49% of the time/requires cueing 51 - 75% of the time  2. Anticoagulation/DVT prophylaxis with Pharmaceutical: Lovenox 3. Pain Management: Post op stump norco. Neuropathic pain: increase neurontin to qid.trial nortriptyline Patient Active Hospital Problem List: Unilateral AKA (03/19/2012)    POA: Unknown   Assessment: minimal Bleeding around incision monitor, limit pt from removing dressing- continue local care   Plan:  cont drsg changes DM (12/18/2006)    POA: Yes   Assessment:    Plan: reduced insulin- reasonable control yesterday Multiple sclerosis (12/18/2006)    POA: Yes   Assessment: spasticity an issue   Plan: titrate baclofen,symmetrel for fatigue but may reduce dose CAD (coronary artery disease) ()    POA: Yes   Assessment: stable   Plan: cont ASA 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  Gradually  increase monitor HR   LOS (Days) 12 A FACE TO FACE EVALUATION WAS PERFORMED  KIRSTEINS,ANDREW E 03/31/2012, 6:58 AM

## 2012-03-31 NOTE — Progress Notes (Signed)
Social Work Discharge Note Discharge Note  The overall goal for the admission was met for:   Discharge location: Yes-HOME WITH WIFE TO PROVIDE ASSIST ALSO ASSIST FROM DAUGHTER  Length of Stay: Yes-12 DAYS  Discharge activity level: Yes-MOD/MAX ASSIST  Home/community participation: Yes  Services provided included: MD, RD, PT, OT, RN, TR, Pharmacy and SW  Financial Services: Medicare and Private Insurance: [facility] AND [employer]  Follow-up services arranged: Home Health: [facility]-PT, Charity fundraiser, DME: [facility] and Patient/Family request agency HH: [facility], DME: [facility]  Comments (or additional information):WIFE TO HIRE ASSIST ALONG WITH HER DAUGHTER.  WANT TO TRY IT AT HOME, PT REALLY WANTS TO GO HOME.  AWARE TO CONTACT WORKER IF DOESN'T WORK AT HOME  Patient/Family verbalized understanding of follow-up arrangements: Yes  Individual responsible for coordination of the follow-up plan: [name]-WIFE  Confirmed correct DME delivered: [name] 03/31/2012    [name]

## 2012-04-01 DIAGNOSIS — I10 Essential (primary) hypertension: Secondary | ICD-10-CM | POA: Diagnosis not present

## 2012-04-01 DIAGNOSIS — I739 Peripheral vascular disease, unspecified: Secondary | ICD-10-CM | POA: Diagnosis not present

## 2012-04-01 DIAGNOSIS — G8220 Paraplegia, unspecified: Secondary | ICD-10-CM | POA: Diagnosis not present

## 2012-04-01 DIAGNOSIS — E119 Type 2 diabetes mellitus without complications: Secondary | ICD-10-CM | POA: Diagnosis not present

## 2012-04-01 DIAGNOSIS — G35 Multiple sclerosis: Secondary | ICD-10-CM | POA: Diagnosis not present

## 2012-04-01 DIAGNOSIS — Z4789 Encounter for other orthopedic aftercare: Secondary | ICD-10-CM | POA: Diagnosis not present

## 2012-04-02 DIAGNOSIS — E119 Type 2 diabetes mellitus without complications: Secondary | ICD-10-CM | POA: Diagnosis not present

## 2012-04-02 DIAGNOSIS — I10 Essential (primary) hypertension: Secondary | ICD-10-CM | POA: Diagnosis not present

## 2012-04-02 DIAGNOSIS — G35 Multiple sclerosis: Secondary | ICD-10-CM | POA: Diagnosis not present

## 2012-04-02 DIAGNOSIS — I739 Peripheral vascular disease, unspecified: Secondary | ICD-10-CM | POA: Diagnosis not present

## 2012-04-02 DIAGNOSIS — G8220 Paraplegia, unspecified: Secondary | ICD-10-CM | POA: Diagnosis not present

## 2012-04-02 DIAGNOSIS — Z4789 Encounter for other orthopedic aftercare: Secondary | ICD-10-CM | POA: Diagnosis not present

## 2012-04-03 DIAGNOSIS — I10 Essential (primary) hypertension: Secondary | ICD-10-CM | POA: Diagnosis not present

## 2012-04-03 DIAGNOSIS — E119 Type 2 diabetes mellitus without complications: Secondary | ICD-10-CM | POA: Diagnosis not present

## 2012-04-03 DIAGNOSIS — Z4789 Encounter for other orthopedic aftercare: Secondary | ICD-10-CM | POA: Diagnosis not present

## 2012-04-03 DIAGNOSIS — I739 Peripheral vascular disease, unspecified: Secondary | ICD-10-CM | POA: Diagnosis not present

## 2012-04-03 DIAGNOSIS — G35 Multiple sclerosis: Secondary | ICD-10-CM | POA: Diagnosis not present

## 2012-04-03 DIAGNOSIS — G8220 Paraplegia, unspecified: Secondary | ICD-10-CM | POA: Diagnosis not present

## 2012-04-06 DIAGNOSIS — I739 Peripheral vascular disease, unspecified: Secondary | ICD-10-CM | POA: Diagnosis not present

## 2012-04-06 DIAGNOSIS — G8220 Paraplegia, unspecified: Secondary | ICD-10-CM | POA: Diagnosis not present

## 2012-04-06 DIAGNOSIS — I10 Essential (primary) hypertension: Secondary | ICD-10-CM | POA: Diagnosis not present

## 2012-04-06 DIAGNOSIS — G35 Multiple sclerosis: Secondary | ICD-10-CM | POA: Diagnosis not present

## 2012-04-06 DIAGNOSIS — Z4789 Encounter for other orthopedic aftercare: Secondary | ICD-10-CM | POA: Diagnosis not present

## 2012-04-06 DIAGNOSIS — E119 Type 2 diabetes mellitus without complications: Secondary | ICD-10-CM | POA: Diagnosis not present

## 2012-04-06 NOTE — Progress Notes (Signed)
Discharge # [id number]

## 2012-04-07 DIAGNOSIS — G35 Multiple sclerosis: Secondary | ICD-10-CM | POA: Diagnosis not present

## 2012-04-07 DIAGNOSIS — Z4789 Encounter for other orthopedic aftercare: Secondary | ICD-10-CM | POA: Diagnosis not present

## 2012-04-07 DIAGNOSIS — I10 Essential (primary) hypertension: Secondary | ICD-10-CM | POA: Diagnosis not present

## 2012-04-07 DIAGNOSIS — G8220 Paraplegia, unspecified: Secondary | ICD-10-CM | POA: Diagnosis not present

## 2012-04-07 DIAGNOSIS — I739 Peripheral vascular disease, unspecified: Secondary | ICD-10-CM | POA: Diagnosis not present

## 2012-04-07 DIAGNOSIS — E119 Type 2 diabetes mellitus without complications: Secondary | ICD-10-CM | POA: Diagnosis not present

## 2012-04-07 NOTE — Discharge Summary (Signed)
[name], Jaime NO.:  [id number]  MEDICAL RECORD NO.:  [id number]  LOCATION:  4003                         FACILITY:  [facility]  PHYSICIAN:  Erick Colace, M.D.DATE OF BIRTH:  December 22, 1940  DATE OF ADMISSION:  03/19/2012 DATE OF DISCHARGE:  03/31/2012                              DISCHARGE SUMMARY   DISCHARGE DIAGNOSES: 1. 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 Multiple sclerosis with paraparesis. 3. Diabetes mellitus type 2. 4. Coronary artery disease. 5. Probable depression.  HISTORY OF PRESENT ILLNESS:  Mr. Jaime [name] is a 71 year old male with history of multiple sclerosis with paraplegia and cognitive deficit, DM type 2, peripheral vascular disease with ischemic left foot and rest pain.  He was admitted on March 11, 2012, for left CFA to tibioperoneal trunk bypass by Dr. Imogene Burn, postop had loss of pulse due to acute thrombosis of tibioperoneal trunk with concomitant thrombosis of distal femorotibioperoneal bypass.  He was taken back to OR for thrombectomy late that p.m., but had minimal perfusion of foot, left AKA was advised and the patient may require right AKA in the future if he is symptomatic.  The patient was agreeable to procdure and underwent left AKA on March 17, 2012.  Post op has had problems with confusion. He was evaluated by Therapy Team and rehab was recommended for progression.  PAST MEDICAL HISTORY: 1. Ischemic cardiomyopathy with EF of 45%. 2. History of pulmonary hypertension. 3. CHF. 4. Pulmonary nodule. 5. Diabetes mellitus type 2. 6. Hypertension.  FUNCTIONAL STATUS:  The patient was able to perform bed mobility and transfer from bed to wheelchair with use of trapeze and hospital bed. He required assistance with transfers back into bed.  He was Nonambulatory. Needed assistance with ADLs.  FUNCTIONAL STATUS:  The patient was max-assist for bathing,  max-assist for dressing, min-assist for grooming and feeding.  He was +2 total- assist 10% for squat pivot transfers, +2 total-assist 10% for bed mobility.  HOSPITAL COURSE:  Mr. Jaime [name] was admitted to Rehab on March 19, 2012, for inpatient therapies to consist of PT, OT at least 3 hours 5 days a week.  Past-admission, physiatrist, rehab RN, and Therapy Team have worked together to provide customized collaborative interdisciplinary care.  Majority of focus has been ongoing on building patient's endurance and family education.  The patient continued  To have problems with confusion past admission.  CT of head was done and was negative for bleed or any acute intracranial processes.  Atrophy and nonspecific white matter changes were noted.  Labs were done for follow up.  He was noted to have hypernatremia likely due to renal insufficiency and he was treated with IV fluids.   Last check of labs from March 25, 2012, revealed sodium 141, potassium 3.8, chloride 104, CO2 22, BUN 17, creatinine 1.19, glucose 116.  He was noted to have acute blood loss anemia with H and H at 9.2 and 28.8, white count at 9.6, platelets 388.  He was noted to have a positive UA with concerns of UTI due to his worsening of confusion as well as leukocytosis.  He was treated with short  course of antibiotics. Mentation has slowly improved.  He continued to have problems with right foot pain and was started on low-dose Elavil to help with mood as well as neuropathy.  The patient's blood sugars were monitored on before meal and at bedtime basis.  He has had problems with hyperglycemia with blood sugars dropping from 54s to 120 range.  His insulin has been adjusted to prevent these episodes.  The patient's p.o. intake has been good.  His activity tolerance as well as mentation had greatly improved by time of discharge.  During the patient's stay in Rehab, weekly team conferences were held to monitor the  patient's progress, set goals, as well as discuss barriers to discharge.  PT/OT has been ongoing with focus on strengthening, improving activity tolerance as well as improving independence with self- care tasks.  At the time of discharge, the patient was at total-assist for lift transfers, supervision for wheelchair mobility in personal power wheelchair.  The patient's wife was educated on operating [place] lift to utilize this for safe transfers.  OT has worked with the patient on improving his attention and awareness with self-care tasks.  The patient was able to perform bathing and dressing at bed level as at home With wife providing total assist.  Initially, patient's wife expressed Concerns about ability to handle him at home. However as patient's cognitive And physical status improved, his wife felt she would be able to provide care Needed with hired assist along with family assisting as needed. Further followup home health therapies have been arranged to continue past-discharge. On March 31, 2012, the patient is discharged to home in improved condition.   DISCHARGE MEDICATIONS: 1. Baclofen 5 mg p.o. b.i.d. 2. Hydrocodone 5-325 1 p.o. q.4 hours p.r.n. pain. 3. 70/30 insulin 4 units subcu q.a.m. And pm 4. Pamelor 10 mg p.o. at bedtime. 5. Protonix 40 mg p.o. per day. 6. Amantadine 100 mg p.o. b.i.d. 7. Neurontin 300 mg p.o. q.i.d. 8. Metoprolol 37.5 mg p.o. per day. 9. Aspirin 81 mg p.o. per day. 10. Voltaren gel q.i.d. to shoulder. 11. Zocor 40 mg p.o. per day.  DIET:  Carb modified medium.  ACTIVITY:  24 hours assistance.  WOUND CARE:  Keep the area clean and dry.  SPECIAL INSTRUCTIONS:  [facility] to provide PT, RN, and Child psychotherapist.  FOLLOWUP:  The patient to follow up with Dr. Leonides Sake for postop check; follow up with Dr. Willow Ora on April 13, 2012, at 1:30 p.m.; follow up with Dr. Claudette Laws on May 08, 2012, at 11 a.m.     Delle Reining,  P.A.   ______________________________ Erick Colace, M.D.    PL/MEDQ  D:  04/06/2012  T:  04/07/2012  Job:  [id number]  cc:   Fransisco Hertz, MD Willow Ora, MD

## 2012-04-08 DIAGNOSIS — Z4789 Encounter for other orthopedic aftercare: Secondary | ICD-10-CM | POA: Diagnosis not present

## 2012-04-08 DIAGNOSIS — G8220 Paraplegia, unspecified: Secondary | ICD-10-CM | POA: Diagnosis not present

## 2012-04-08 DIAGNOSIS — I739 Peripheral vascular disease, unspecified: Secondary | ICD-10-CM | POA: Diagnosis not present

## 2012-04-08 DIAGNOSIS — I10 Essential (primary) hypertension: Secondary | ICD-10-CM | POA: Diagnosis not present

## 2012-04-08 DIAGNOSIS — G35 Multiple sclerosis: Secondary | ICD-10-CM | POA: Diagnosis not present

## 2012-04-08 DIAGNOSIS — E119 Type 2 diabetes mellitus without complications: Secondary | ICD-10-CM | POA: Diagnosis not present

## 2012-04-10 DIAGNOSIS — G8220 Paraplegia, unspecified: Secondary | ICD-10-CM | POA: Diagnosis not present

## 2012-04-10 DIAGNOSIS — G35 Multiple sclerosis: Secondary | ICD-10-CM | POA: Diagnosis not present

## 2012-04-10 DIAGNOSIS — I739 Peripheral vascular disease, unspecified: Secondary | ICD-10-CM | POA: Diagnosis not present

## 2012-04-10 DIAGNOSIS — E119 Type 2 diabetes mellitus without complications: Secondary | ICD-10-CM | POA: Diagnosis not present

## 2012-04-10 DIAGNOSIS — Z4789 Encounter for other orthopedic aftercare: Secondary | ICD-10-CM | POA: Diagnosis not present

## 2012-04-10 DIAGNOSIS — I10 Essential (primary) hypertension: Secondary | ICD-10-CM | POA: Diagnosis not present

## 2012-04-13 ENCOUNTER — Ambulatory Visit (INDEPENDENT_AMBULATORY_CARE_PROVIDER_SITE_OTHER): Payer: Medicare Other | Admitting: Internal Medicine

## 2012-04-13 VITALS — BP 136/82 | HR 60 | Temp 97.7°F

## 2012-04-13 DIAGNOSIS — F17200 Nicotine dependence, unspecified, uncomplicated: Secondary | ICD-10-CM | POA: Diagnosis not present

## 2012-04-13 DIAGNOSIS — I2510 Atherosclerotic heart disease of native coronary artery without angina pectoris: Secondary | ICD-10-CM | POA: Diagnosis not present

## 2012-04-13 DIAGNOSIS — I509 Heart failure, unspecified: Secondary | ICD-10-CM

## 2012-04-13 DIAGNOSIS — E119 Type 2 diabetes mellitus without complications: Secondary | ICD-10-CM | POA: Diagnosis not present

## 2012-04-13 DIAGNOSIS — D696 Thrombocytopenia, unspecified: Secondary | ICD-10-CM

## 2012-04-13 DIAGNOSIS — I999 Unspecified disorder of circulatory system: Secondary | ICD-10-CM

## 2012-04-13 DIAGNOSIS — I998 Other disorder of circulatory system: Secondary | ICD-10-CM

## 2012-04-13 DIAGNOSIS — I70229 Atherosclerosis of native arteries of extremities with rest pain, unspecified extremity: Secondary | ICD-10-CM

## 2012-04-13 LAB — CBC WITH DIFFERENTIAL/PLATELET
Eosinophils Relative: 5.2 % — ABNORMAL HIGH (ref 0.0–5.0)
HCT: 43.1 % (ref 39.0–52.0)
Lymphs Abs: 1.6 10*3/uL (ref 0.7–4.0)
MCV: 84 fl (ref 78.0–100.0)
Monocytes Absolute: 0.5 10*3/uL (ref 0.1–1.0)
Platelets: 138 10*3/uL — ABNORMAL LOW (ref 150.0–400.0)
RDW: 17.9 % — ABNORMAL HIGH (ref 11.5–14.6)
WBC: 4.7 10*3/uL (ref 4.5–10.5)

## 2012-04-13 MED ORDER — GABAPENTIN 800 MG PO TABS: 800.0000 mg | ORAL_TABLET | Freq: Three times a day (TID) | ORAL | Status: DC

## 2012-04-13 NOTE — Progress Notes (Signed)
  Subjective:    Patient ID: Jaime [name], male    DOB: [birth year], 71 y.o.   MRN: [id number]  HPI  Hospital followup  03/11/12  1. Left tibioperoneal trunk endarterectomy and patch angioplasty with vein  2. Left common femoral artery to tibioperoneal trunk bypass  3. Open cannulation of left tibioperoneal trunk  4. Left leg angiogram  03/17/12  AMPUTATION Left ABOVE KNEE  F/u the  hospitalization, he went to rehabilitation, eventually was d/c home . Here for followup. Labs reviewed, last BMP normal, last hemoglobin 9.2 (postop)  Past Medical History:   MS   DM   Hyperlipidemia Pulmonary hypertension moderate.... echo.... January, 2008   Ischemic cardiomyopathy   CAD non-STEMI.. 2007.Marland Kitchen occluded circumflex... Taxus stent placed.... residual 80% LAD.Marland Kitchen 50% RCA   Peripheral vascular disease  BPH   Lung nodule-- resolved per 11-2006 CT chest    Past Surgical History:   TURP, Dr Nessi---> 07-30-11   hernia repair (R)   Above K amputation L 02-2012 d/t PVD  Social History:   Married, children x 2   As of 03-2012 he gets physical therapy for home, help w/ bath and nurse visits Current Smoker, quit 02-2012, occ E-cigarret   Alcohol use-no    Review of Systems Since he left the hospital and rehabilitation he is doing okay. Appetite is good, blood sugars remain around 120 or less. Denies depression or anxiety. He is getting all the help he needs including nurse to visit him at home, physical therapy and help with his bath. He reports phantom pain on the amputated leg and also some pain in the right leg , thought to be from peripheral vascular disease. The pain in the right leg is not intense and  on and off. Denies chest pain or shortness of breath, he does have some fatigue. Memory has decreased according to the wife.     Objective:   Physical Exam General -- alert, well-developed, and well-nourished.   Lungs -- normal respiratory effort, no intercostal retractions, no  accessory muscle use, and normal breath sounds.   Heart-- normal rate, regular rhythm, no murmur, and no gallop.   Abdomen--soft, non-tender, no distention Extremities-- no pretibial edema @ the R, s/p AKA  @ L Neurologic-- alert & oriented X3  Psych-- Alert and cooperative with normal attention span and concentration.  not anxious appearing and not depressed appearing.       Assessment & Plan:  Today , I spent more than 25 min with the patient, >50% of the time counseling, and /or reviewing the chart and labs ordered by other providers

## 2012-04-13 NOTE — Assessment & Plan Note (Signed)
Per Dr Ballan 

## 2012-04-13 NOTE — Assessment & Plan Note (Signed)
Quit tobacco 02-2012

## 2012-04-13 NOTE — Assessment & Plan Note (Signed)
No evidence of volume overload. 

## 2012-04-13 NOTE — Assessment & Plan Note (Signed)
Status post left AKA due to critical stenosis, having some phantom pain. Will increase Neurontin from 300 4 times a day to 800 mg 3 times a day. Wife will  call me if the side effects or intolerances. He also has some pain in the right leg, likely d/t vascular disease. He will see his vascular surgeon this week, if pain gets severe  needs to contact his vascular surgeon.

## 2012-04-13 NOTE — Assessment & Plan Note (Signed)
Denies chest pain or shortness of breath

## 2012-04-14 ENCOUNTER — Encounter: Payer: Self-pay | Admitting: Internal Medicine

## 2012-04-14 ENCOUNTER — Encounter: Payer: Self-pay | Admitting: *Deleted

## 2012-04-14 DIAGNOSIS — Z4789 Encounter for other orthopedic aftercare: Secondary | ICD-10-CM | POA: Diagnosis not present

## 2012-04-14 DIAGNOSIS — E119 Type 2 diabetes mellitus without complications: Secondary | ICD-10-CM | POA: Diagnosis not present

## 2012-04-14 DIAGNOSIS — I739 Peripheral vascular disease, unspecified: Secondary | ICD-10-CM | POA: Diagnosis not present

## 2012-04-14 DIAGNOSIS — G8220 Paraplegia, unspecified: Secondary | ICD-10-CM | POA: Diagnosis not present

## 2012-04-14 DIAGNOSIS — I10 Essential (primary) hypertension: Secondary | ICD-10-CM | POA: Diagnosis not present

## 2012-04-14 DIAGNOSIS — G35 Multiple sclerosis: Secondary | ICD-10-CM | POA: Diagnosis not present

## 2012-04-15 DIAGNOSIS — Z4789 Encounter for other orthopedic aftercare: Secondary | ICD-10-CM | POA: Diagnosis not present

## 2012-04-15 DIAGNOSIS — I10 Essential (primary) hypertension: Secondary | ICD-10-CM | POA: Diagnosis not present

## 2012-04-15 DIAGNOSIS — I739 Peripheral vascular disease, unspecified: Secondary | ICD-10-CM | POA: Diagnosis not present

## 2012-04-15 DIAGNOSIS — E119 Type 2 diabetes mellitus without complications: Secondary | ICD-10-CM | POA: Diagnosis not present

## 2012-04-15 DIAGNOSIS — G35 Multiple sclerosis: Secondary | ICD-10-CM | POA: Diagnosis not present

## 2012-04-15 DIAGNOSIS — G8220 Paraplegia, unspecified: Secondary | ICD-10-CM | POA: Diagnosis not present

## 2012-04-16 ENCOUNTER — Encounter: Payer: Self-pay | Admitting: Vascular Surgery

## 2012-04-16 DIAGNOSIS — I739 Peripheral vascular disease, unspecified: Secondary | ICD-10-CM | POA: Diagnosis not present

## 2012-04-16 DIAGNOSIS — G8220 Paraplegia, unspecified: Secondary | ICD-10-CM | POA: Diagnosis not present

## 2012-04-16 DIAGNOSIS — I10 Essential (primary) hypertension: Secondary | ICD-10-CM | POA: Diagnosis not present

## 2012-04-16 DIAGNOSIS — Z4789 Encounter for other orthopedic aftercare: Secondary | ICD-10-CM | POA: Diagnosis not present

## 2012-04-16 DIAGNOSIS — G35 Multiple sclerosis: Secondary | ICD-10-CM | POA: Diagnosis not present

## 2012-04-16 DIAGNOSIS — E119 Type 2 diabetes mellitus without complications: Secondary | ICD-10-CM | POA: Diagnosis not present

## 2012-04-17 ENCOUNTER — Ambulatory Visit (INDEPENDENT_AMBULATORY_CARE_PROVIDER_SITE_OTHER): Payer: Medicare Other | Admitting: Vascular Surgery

## 2012-04-17 ENCOUNTER — Encounter: Payer: Self-pay | Admitting: Vascular Surgery

## 2012-04-17 ENCOUNTER — Encounter: Payer: Self-pay | Admitting: Cardiology

## 2012-04-17 VITALS — BP 138/96 | HR 87 | Ht 70.0 in | Wt 155.0 lb

## 2012-04-17 DIAGNOSIS — G8220 Paraplegia, unspecified: Secondary | ICD-10-CM | POA: Diagnosis not present

## 2012-04-17 DIAGNOSIS — I739 Peripheral vascular disease, unspecified: Secondary | ICD-10-CM | POA: Insufficient documentation

## 2012-04-17 DIAGNOSIS — G35 Multiple sclerosis: Secondary | ICD-10-CM | POA: Diagnosis not present

## 2012-04-17 DIAGNOSIS — L98499 Non-pressure chronic ulcer of skin of other sites with unspecified severity: Secondary | ICD-10-CM

## 2012-04-17 DIAGNOSIS — Z4789 Encounter for other orthopedic aftercare: Secondary | ICD-10-CM | POA: Diagnosis not present

## 2012-04-17 DIAGNOSIS — I272 Pulmonary hypertension, unspecified: Secondary | ICD-10-CM | POA: Insufficient documentation

## 2012-04-17 DIAGNOSIS — R9430 Abnormal result of cardiovascular function study, unspecified: Secondary | ICD-10-CM | POA: Insufficient documentation

## 2012-04-17 DIAGNOSIS — R911 Solitary pulmonary nodule: Secondary | ICD-10-CM | POA: Insufficient documentation

## 2012-04-17 DIAGNOSIS — I10 Essential (primary) hypertension: Secondary | ICD-10-CM | POA: Insufficient documentation

## 2012-04-17 DIAGNOSIS — Z89619 Acquired absence of unspecified leg above knee: Secondary | ICD-10-CM | POA: Insufficient documentation

## 2012-04-17 DIAGNOSIS — Z720 Tobacco use: Secondary | ICD-10-CM | POA: Insufficient documentation

## 2012-04-17 DIAGNOSIS — I7025 Atherosclerosis of native arteries of other extremities with ulceration: Secondary | ICD-10-CM | POA: Insufficient documentation

## 2012-04-17 DIAGNOSIS — G8190 Hemiplegia, unspecified affecting unspecified side: Secondary | ICD-10-CM | POA: Insufficient documentation

## 2012-04-17 DIAGNOSIS — IMO0002 Reserved for concepts with insufficient information to code with codable children: Secondary | ICD-10-CM | POA: Insufficient documentation

## 2012-04-17 DIAGNOSIS — I255 Ischemic cardiomyopathy: Secondary | ICD-10-CM | POA: Insufficient documentation

## 2012-04-17 DIAGNOSIS — E119 Type 2 diabetes mellitus without complications: Secondary | ICD-10-CM | POA: Diagnosis not present

## 2012-04-17 DIAGNOSIS — R339 Retention of urine, unspecified: Secondary | ICD-10-CM | POA: Insufficient documentation

## 2012-04-17 NOTE — Progress Notes (Signed)
[facility] [place]  Postoperative Visit  History of Present Illness  Jaime [name] is a 71 y.o. male who presents for postoperative follow-up for: L above-the-knee amputation (Date: 03/17/12).  The patient's wounds are healed.  The patient notes L AKA pain is somewhat controlled.  The patient's current symptoms are: back pain and now R foot pain.  Physical Examination  Filed Vitals:   04/17/12 1508  BP: 138/96  Pulse: 87   LLE: L AKA Incision is healed.  Staples are intact.  R foot: ischemic appearing toes without frank gangrene  Medical Decision Making  Jaime [name] is a 71 y.o. male who presents s/p L Above-the-knee ampuation.  The patient's stump is healing appropriately with resolution of pre-operative symptoms.  Unfortunately, the patient's PAD is now progressing on the R side, so R AKA will likely be needed as there was no revascularization option on this side.    The patient is going to consider proceeding and will call us when he is ready to proceed with R AKA.  Leonides Sake, MD [facility] Office: [phone] Pager: [phone]

## 2012-04-20 ENCOUNTER — Encounter: Payer: Self-pay | Admitting: Cardiology

## 2012-04-20 ENCOUNTER — Ambulatory Visit (INDEPENDENT_AMBULATORY_CARE_PROVIDER_SITE_OTHER): Payer: Medicare Other | Admitting: Cardiology

## 2012-04-20 VITALS — BP 148/88 | HR 58 | Ht 71.0 in | Wt 169.0 lb

## 2012-04-20 DIAGNOSIS — I2510 Atherosclerotic heart disease of native coronary artery without angina pectoris: Secondary | ICD-10-CM

## 2012-04-20 DIAGNOSIS — I2589 Other forms of chronic ischemic heart disease: Secondary | ICD-10-CM | POA: Diagnosis not present

## 2012-04-20 DIAGNOSIS — G35 Multiple sclerosis: Secondary | ICD-10-CM | POA: Diagnosis not present

## 2012-04-20 DIAGNOSIS — Z01810 Encounter for preprocedural cardiovascular examination: Secondary | ICD-10-CM | POA: Diagnosis not present

## 2012-04-20 DIAGNOSIS — I739 Peripheral vascular disease, unspecified: Secondary | ICD-10-CM | POA: Diagnosis not present

## 2012-04-20 DIAGNOSIS — Z4789 Encounter for other orthopedic aftercare: Secondary | ICD-10-CM | POA: Diagnosis not present

## 2012-04-20 DIAGNOSIS — I10 Essential (primary) hypertension: Secondary | ICD-10-CM | POA: Diagnosis not present

## 2012-04-20 DIAGNOSIS — G8220 Paraplegia, unspecified: Secondary | ICD-10-CM | POA: Diagnosis not present

## 2012-04-20 DIAGNOSIS — I255 Ischemic cardiomyopathy: Secondary | ICD-10-CM

## 2012-04-20 DIAGNOSIS — E119 Type 2 diabetes mellitus without complications: Secondary | ICD-10-CM | POA: Diagnosis not present

## 2012-04-20 NOTE — Assessment & Plan Note (Signed)
The patient has significant coronary disease.He did have a nuclear scan in August, 2013. There was old scar. There was no definite ischemia. The ejection fraction was 25%.

## 2012-04-20 NOTE — Assessment & Plan Note (Signed)
I have reviewed all of the data. Despite his multitude of problems I do feel that he can undergo right AKA if needed. Further cardiac workup is not needed at this time.

## 2012-04-20 NOTE — Assessment & Plan Note (Signed)
The patient has significant cardiomyopathy. He is on appropriate medications at this time. I feel would not be prudent to try to adjust his medicines further during this time when he is being considered for possible further surgery on his right leg. I will follow him over time and we will then see about further adjustments.

## 2012-04-20 NOTE — Progress Notes (Signed)
HPI  Patient is seen to followup coronary disease and cardiomyopathy. The patient unfortunately has had severe peripheral arterial disease. He is status post left AKA. It seems that he may need right AKA. Before his most recent operation he had a nuclear scan in August, 2013. There was no significant ischemia. However his ejection fraction was in the 25% range. Echo was done to be sure that this assessment of his LV function was correct. The echo also showed an ejection fraction in the range of 25%. He was able to have his surgery and he tolerated. He returns today and is not having any chest pain or shortness of breath.  Allergies  Allergen Reactions  . Bee Venom Anaphylaxis    Current Outpatient Prescriptions  Medication Sig Dispense Refill  . amantadine (SYMMETREL) 100 MG capsule Take 1 capsule (100 mg total) by mouth 2 (two) times daily.  60 capsule  1  . aspirin EC 81 MG tablet Take 81 mg by mouth daily.      . B-D ULTRAFINE III SHORT PEN 31G X 8 MM MISC       . baclofen (LIORESAL) 10 MG tablet Take 10 mg by mouth as directed.       . baclofen (LIORESAL) 5 mg TABS Take 0.5 tablets (5 mg total) by mouth 2 (two) times daily.  60 each  1  . diclofenac sodium (VOLTAREN) 1 % GEL Apply 1 application topically 4 (four) times daily as needed. For pain       . gabapentin (NEURONTIN) 300 MG capsule Take 300 mg by mouth 4 (four) times daily.       Marland Kitchen gabapentin (NEURONTIN) 800 MG tablet Take 1 tablet (800 mg total) by mouth 3 (three) times daily.  90 tablet  6  . insulin aspart protamine-insulin aspart (NOVOLOG 70/30) (70-30) 100 UNIT/ML injection Inject 4 Units into the skin daily with supper.  10 mL  1  . insulin aspart protamine-insulin aspart (NOVOLOG 70/30) (70-30) 100 UNIT/ML injection Inject 4 Units into the skin daily with breakfast.  10 mL  1  . meloxicam (MOBIC) 15 MG tablet Take 15 mg by mouth daily.       . metoprolol succinate (TOPROL-XL) 12.5 mg TB24 Take 1.5 tablets (37.5 mg total)  by mouth daily.  90 tablet  1  . nortriptyline (PAMELOR) 10 MG capsule Take 1 capsule (10 mg total) by mouth at bedtime.  30 capsule  1  . pantoprazole (PROTONIX) 40 MG tablet Take 1 tablet (40 mg total) by mouth daily at 12 noon.  30 tablet  1  . simvastatin (ZOCOR) 40 MG tablet Take 1 tablet (40 mg total) by mouth at bedtime.  90 tablet  1  . DISCONTD: metoprolol succinate (TOPROL-XL) 25 MG 24 hr tablet         History   Social History  . Marital Status: Married    Spouse Name: N/A    Number of Children: 2  . Years of Education: N/A   Occupational History  . disable    Social History Main Topics  . Smoking status: Former Smoker -- 50 years    Types: Cigarettes  . Smokeless tobacco: Never Used   Comment: pt states that he is using E-cigs  . Alcohol Use: No  . Drug Use: No  . Sexually Active: Not on file   Other Topics Concern  . Not on file   Social History Narrative   Lives w/ wife    Family History  Problem Relation Age of Onset  . Hypertension    . Heart attack Mother 95  . Heart disease Mother   . Stroke Mother   . Hyperlipidemia Mother   . Hypertension Mother   . Colon cancer Neg Hx   . Prostate cancer Neg Hx   . COPD Father   . Peripheral vascular disease Father   . Diabetes Brother   . Heart disease Brother   . Hypertension Brother   . Heart attack Brother   . Diabetes Daughter     Past Medical History  Diagnosis Date  . Hyperlipidemia   . Cardiomyopathy, ischemic     EF 45% per ECHO 2008  //   EF 25%, echo, August, 2013  . Pulmonary hypertension     moderate ECHO Jan 2008  . Systolic heart failure   . Multiple sclerosis DX 1993    NEUROLOGIST-  DR DOHMEIER -- LAST VISIT 11-20-2010  NOTE W/ CHART  . Lung nodule     resolved 11-2006 CT Chest  . Tobacco abuse   . Increased prostate specific antigen (PSA) velocity   . Urinary retention     dx ~ 2-12, like from MS, now with a catheter, saw urology  . Ejection fraction      EF 45%, echo, 2008   //   EF 25%, echo, August, 2013  . Cellulitis   . Pre-syncope     Near syncope, ER visit, October, 2011  . Carotid artery disease     Doppler, February, 2012, 0-39% bilateral,Mild smooth plaque  . Chronic indwelling foley catheter   . Scoliosis associated with other condition   . Hemiparesis   . Fatigue SEVERE  . Weakness   . Impotence   . MI, acute, non ST segment elevation 2007    S/P PCI WITH X1 STENT (TAXUS DRUG-ELUTING) LEFT CIRCUMFLEX  . Echocardiogram abnormal 08-17-2006    SEVERE HYPOKINESIS OF INFERIOR , POSTEROR WALLS OF BASE AND MID VENTRICLE AND MOD. PULMONARY HTN  . Diabetes mellitus     INSULIN-DEPENDANT  . Hypertension   . H/O pleural effusion 2008    POST THORACENTESIS  . History of colon polyps PRECANCEROUS  . Insomnia   . Ingrown right big toenail CHRONIC    PER PT VERY SORE  . CAD (coronary artery disease) CARDIOLOGIST- DR Syreeta Figler-- VISIT 06-05-2011 IN [facility]    non-STEMI, 2007.Marland Kitchenoccluded circumflex.. Taxus stent placed...residual 80% LAD...50% RCA  . Pneumonia     hx of  . Urinary tract infection     hx of  . Neuromuscular disorder     multiple sclerosis  . Atherosclerotic PVD with ulceration     left foot  . S/P AKA (above knee amputation)     left AKA August, 2013, after vascular surgery failed    Past Surgical History  Procedure Date  . Hernia repair 1990    (R)  . Coronary angioplasty with stent placement 05-01-2006    OCCLUDED CIRCUMFLEX -- TAXUS STENT PLACMENT  AND RESIDUAL 80% LAD,  50% RCA  . Thoracentesis 2008    PLEURAL EFFUSION  . Cystoscopy 07/30/2011    Procedure: CYSTOSCOPY;  Surgeon: Lindaann Slough, MD;  Location[facility];  Service: Urology;  Laterality: N/A;  . Transurethral resection of prostate 07/30/2011    Procedure: TRANSURETHRAL RESECTION OF THE PROSTATE (TURP);  Surgeon: Lindaann Slough, MD;  Location: [facility];  Service: Urology;  Laterality: N/A;  . Femoral-tibial bypass graft 03/11/2012     Procedure:  BYPASS GRAFT FEMORAL-TIBIAL ARTERY;  Surgeon: Fransisco Hertz, MD;  Location: [facility] OR;  Service: Vascular;  Laterality: Left;  Left Femoral -Tibial trunk bypass, Endarterectomy of Tibial- Peroneal trunk with vein angioplasty.  . Intraoperative arteriogram 03/11/2012    Procedure: INTRA OPERATIVE ARTERIOGRAM;  Surgeon: Fransisco Hertz, MD;  Location: [facility] OR;  Service: Vascular;  Laterality: Left;  . Femoral-popliteal bypass graft 03/11/2012    Procedure: BYPASS GRAFT FEMORAL-POPLITEAL ARTERY;  Surgeon: Fransisco Hertz, MD;  Location: [facility] OR;  Service: Vascular;  Laterality: Left;  embolectomy left lower leg  . Amputation 03/17/2012    Procedure: AMPUTATION ABOVE KNEE;  Surgeon: Fransisco Hertz, MD;  Location: [facility] OR;  Service: Vascular;  Laterality: Left;    ROS   Patient denies fever, chills, headache, sweats, rash, change in vision, change in hearing, chest pain, cough, nausea vomiting, urinary symptoms. All other systems are reviewed and are negative.  PHYSICAL EXAM   Patient is here in a wheelchair. He is here with his wife. He is of course discourage with all of the medical problems that he has. He is oriented to person time and place. Affect is normal. There is no jugulovenous distention. Lungs reveal no significant rales. There is no respiratory distress. Cardiac exam reveals S1 and S2. There no clicks or significant murmurs. The abdomen is soft. He has a left AKA. There is no edema in the right lower extremity.  Filed Vitals:   04/20/12 1519  BP: 148/88  Pulse: 58  Height: 5\' 11"  (1.803 m)  Weight: 169 lb (76.658 kg)  SpO2: 97%    EKG is done and reviewed by me. The patient has sinus rhythm. There are multiple PACs. There is no evidence of atrial fibrillation. ASSESSMENT & PLAN

## 2012-04-20 NOTE — Patient Instructions (Addendum)
Your physician recommends that you continue on your current medications as directed. Please refer to the Current Medication list given to you today. Your physician wants you to follow-up in: 4 months You will receive a reminder letter in the mail two months in advance. If you don't receive a letter, please call our office to schedule the follow-up appointment.  

## 2012-04-22 ENCOUNTER — Telehealth: Payer: Self-pay

## 2012-04-22 DIAGNOSIS — G35 Multiple sclerosis: Secondary | ICD-10-CM | POA: Diagnosis not present

## 2012-04-22 DIAGNOSIS — Z4789 Encounter for other orthopedic aftercare: Secondary | ICD-10-CM | POA: Diagnosis not present

## 2012-04-22 DIAGNOSIS — I10 Essential (primary) hypertension: Secondary | ICD-10-CM | POA: Diagnosis not present

## 2012-04-22 DIAGNOSIS — IMO0001 Reserved for inherently not codable concepts without codable children: Secondary | ICD-10-CM

## 2012-04-22 DIAGNOSIS — E119 Type 2 diabetes mellitus without complications: Secondary | ICD-10-CM | POA: Diagnosis not present

## 2012-04-22 DIAGNOSIS — G8220 Paraplegia, unspecified: Secondary | ICD-10-CM | POA: Diagnosis not present

## 2012-04-22 DIAGNOSIS — I739 Peripheral vascular disease, unspecified: Secondary | ICD-10-CM

## 2012-04-22 MED ORDER — HYDROCODONE-ACETAMINOPHEN 5-500 MG PO TABS: 1.0000 | ORAL_TABLET | Freq: Four times a day (QID) | ORAL | Status: DC | PRN

## 2012-04-22 NOTE — Telephone Encounter (Signed)
Per order Dr. Imogene Burn okay to give Vicodin per standing order.  Notified pt's wife of approval for RX and that medication (Vicodin) will be phoned-in to pt's pharmacy.  Advised wife to call office when pt. ready to schedule his right leg amputation.  Stated pt. will be scheduling in the near future.

## 2012-04-22 NOTE — Telephone Encounter (Signed)
Message copied by Phillips Odor on Wed Apr 22, 2012 12:39 PM ------      Message from: Fransisco Hertz      Created: Wed Apr 22, 2012 10:40 AM      Regarding: RE: regarding pain medication       Ok to give narcotics. Pt will need to R AKA eventually, once he is ready to proceed.                  ----- Message -----         From: Erenest Blank, RN         Sent: 04/22/2012  10:06 AM           To: Fransisco Hertz, MD      Subject: regarding pain medication                                Wife called and states that pt. Has a lot of pain in right leg.Marland KitchenMarland Kitchen"Neurontin is not helping".  Requesting Rx for pain until pt. Can get surgery scheduled.  Stated she was of the understanding he should wait a couple more weeks to heal from the (L) AKA, before sched. His amputation of right leg.  Do you feel I should give any Vicodin per standing order?

## 2012-04-23 ENCOUNTER — Telehealth: Payer: Self-pay | Admitting: Internal Medicine

## 2012-04-23 ENCOUNTER — Telehealth: Payer: Self-pay

## 2012-04-23 DIAGNOSIS — G8220 Paraplegia, unspecified: Secondary | ICD-10-CM

## 2012-04-23 DIAGNOSIS — Z0271 Encounter for disability determination: Secondary | ICD-10-CM | POA: Diagnosis not present

## 2012-04-23 DIAGNOSIS — I739 Peripheral vascular disease, unspecified: Secondary | ICD-10-CM

## 2012-04-23 DIAGNOSIS — G35 Multiple sclerosis: Secondary | ICD-10-CM | POA: Diagnosis not present

## 2012-04-23 NOTE — Telephone Encounter (Signed)
Please advise 

## 2012-04-23 NOTE — Telephone Encounter (Signed)
[facility] Triage Call Report Triage Record Num: [id number] Operator: Tomasita Crumble Patient Name: Jaime [name] Call Date & Time: 04/20/2012 5:16:04PM Patient Phone: [phone] PCP: Nolon Rod. [name] Patient Gender: Male PCP Fax : Patient DOB: [birth year] Practice Name: [facility] Reason for Call: Caller: [name]/Spouse; PCP: Willow Ora; CB#: [phone]; Call regarding Pain in legs; history of amputation; When he takes Neurontin 800 mg tablet it causes generalized pain, starting with headache. Pain rated at 10 or 12 - crying with pain on 9/28 that resolved within 30-40 minutes. She feels that headache is related to his Neurontin; he tolerates 300 mg well per her report. Per Allergic Reaction, Severe protocol contacted provider on call due to "new onset rash, joint pain, muscle aches, swollen glands or any temperature elevation without known cuase and less than 10 days after starting new medication. Next dose of Neurontin due at 1900. Dr. Rodena Medin on call, contacted; order received to hold the Neurontin for now and follow up with Dr. Drue Novel on 04/21/12. Caller informed of same. She states she needs to give him something for pain. Advised may give Motrin 800 mg q 8 hours until discussing information with Dr. Drue Novel. Caller agreed. Protocol(s) Used: Allergic Reaction, Severe Recommended Outcome per Protocol: Call Provider within 4 Hours Reason for Outcome: New onset rash, joint pain, muscle aches, swollen glands or any temperature elevation without known cause AND less than 10 days after starting

## 2012-04-23 NOTE — Telephone Encounter (Signed)
LMOVM for pt to return call 

## 2012-04-23 NOTE — Telephone Encounter (Signed)
Advised pt wife:  1. Discontinue Motrin  2.Go back to a lower dose of Neurontin. He could do 300 mg 4 times a day OR take Neurontin 800 mg half tablet 4 times a day which is a slightly larger dose.  Change his medication list according to what he likes to do.  3. If the headaches continue he is to let me know.  4. if Neurontin is not helping, we'll have to consider Lyrica or other options  Pt wife stated understanding. Spoke to wife Dr. Claudie Fisherman the surgeon Rx pt 5-500 mg hydrocodone #20 every 6 hours prn for pt for pain helped calm pt down. Has to have right leg amputated.  MW

## 2012-04-23 NOTE — Telephone Encounter (Signed)
Advise patient 1. Discontinue Motrin 2.Go back to a lower dose of Neurontin. He could do 300 mg 4 times a day OR  take Neurontin 800 mg half tablet 4 times a day which is a slightly larger dose. Change his medication list according to what he likes to do. 3. If the headaches continue he is to let me know. 4. if Neurontin is not helping, we'll have to consider Lyrica or other options

## 2012-04-24 DIAGNOSIS — G8220 Paraplegia, unspecified: Secondary | ICD-10-CM | POA: Diagnosis not present

## 2012-04-24 DIAGNOSIS — G35 Multiple sclerosis: Secondary | ICD-10-CM | POA: Diagnosis not present

## 2012-04-24 DIAGNOSIS — Z4789 Encounter for other orthopedic aftercare: Secondary | ICD-10-CM | POA: Diagnosis not present

## 2012-04-24 DIAGNOSIS — I10 Essential (primary) hypertension: Secondary | ICD-10-CM | POA: Diagnosis not present

## 2012-04-24 DIAGNOSIS — I739 Peripheral vascular disease, unspecified: Secondary | ICD-10-CM | POA: Diagnosis not present

## 2012-04-24 DIAGNOSIS — E119 Type 2 diabetes mellitus without complications: Secondary | ICD-10-CM | POA: Diagnosis not present

## 2012-04-24 NOTE — Telephone Encounter (Signed)
LMOVM for pt to return call 

## 2012-04-27 ENCOUNTER — Telehealth: Payer: Self-pay | Admitting: Physical Medicine & Rehabilitation

## 2012-04-27 DIAGNOSIS — G35 Multiple sclerosis: Secondary | ICD-10-CM | POA: Diagnosis not present

## 2012-04-27 DIAGNOSIS — G8220 Paraplegia, unspecified: Secondary | ICD-10-CM | POA: Diagnosis not present

## 2012-04-27 DIAGNOSIS — Z4789 Encounter for other orthopedic aftercare: Secondary | ICD-10-CM | POA: Diagnosis not present

## 2012-04-27 DIAGNOSIS — I739 Peripheral vascular disease, unspecified: Secondary | ICD-10-CM | POA: Diagnosis not present

## 2012-04-27 DIAGNOSIS — I10 Essential (primary) hypertension: Secondary | ICD-10-CM | POA: Diagnosis not present

## 2012-04-27 DIAGNOSIS — E119 Type 2 diabetes mellitus without complications: Secondary | ICD-10-CM | POA: Diagnosis not present

## 2012-04-27 NOTE — Telephone Encounter (Signed)
Per discharge not with Delle Reining PA/Kirsteins MD : SPECIAL INSTRUCTIONS: [facility] to provide PT, RN, and Arts development officer. Assessment completed.  Request for [facility] OT approved.

## 2012-04-27 NOTE — Telephone Encounter (Signed)
Needs order for [facility] OT.

## 2012-04-28 DIAGNOSIS — E119 Type 2 diabetes mellitus without complications: Secondary | ICD-10-CM | POA: Diagnosis not present

## 2012-04-28 DIAGNOSIS — I739 Peripheral vascular disease, unspecified: Secondary | ICD-10-CM | POA: Diagnosis not present

## 2012-04-28 DIAGNOSIS — G8220 Paraplegia, unspecified: Secondary | ICD-10-CM | POA: Diagnosis not present

## 2012-04-28 DIAGNOSIS — Z4789 Encounter for other orthopedic aftercare: Secondary | ICD-10-CM | POA: Diagnosis not present

## 2012-04-28 DIAGNOSIS — I10 Essential (primary) hypertension: Secondary | ICD-10-CM | POA: Diagnosis not present

## 2012-04-28 DIAGNOSIS — G35 Multiple sclerosis: Secondary | ICD-10-CM | POA: Diagnosis not present

## 2012-04-29 ENCOUNTER — Telehealth: Payer: Self-pay | Admitting: Physical Medicine & Rehabilitation

## 2012-04-29 NOTE — Telephone Encounter (Signed)
Care extended.

## 2012-04-29 NOTE — Telephone Encounter (Signed)
Need ext for [facility] aid

## 2012-04-30 DIAGNOSIS — I739 Peripheral vascular disease, unspecified: Secondary | ICD-10-CM | POA: Diagnosis not present

## 2012-04-30 DIAGNOSIS — I10 Essential (primary) hypertension: Secondary | ICD-10-CM | POA: Diagnosis not present

## 2012-04-30 DIAGNOSIS — E119 Type 2 diabetes mellitus without complications: Secondary | ICD-10-CM | POA: Diagnosis not present

## 2012-04-30 DIAGNOSIS — Z4789 Encounter for other orthopedic aftercare: Secondary | ICD-10-CM | POA: Diagnosis not present

## 2012-04-30 DIAGNOSIS — G8220 Paraplegia, unspecified: Secondary | ICD-10-CM | POA: Diagnosis not present

## 2012-04-30 DIAGNOSIS — G35 Multiple sclerosis: Secondary | ICD-10-CM | POA: Diagnosis not present

## 2012-05-01 DIAGNOSIS — Z4789 Encounter for other orthopedic aftercare: Secondary | ICD-10-CM | POA: Diagnosis not present

## 2012-05-01 DIAGNOSIS — G35 Multiple sclerosis: Secondary | ICD-10-CM | POA: Diagnosis not present

## 2012-05-01 DIAGNOSIS — G8220 Paraplegia, unspecified: Secondary | ICD-10-CM | POA: Diagnosis not present

## 2012-05-01 DIAGNOSIS — I739 Peripheral vascular disease, unspecified: Secondary | ICD-10-CM | POA: Diagnosis not present

## 2012-05-01 DIAGNOSIS — I10 Essential (primary) hypertension: Secondary | ICD-10-CM | POA: Diagnosis not present

## 2012-05-01 DIAGNOSIS — E119 Type 2 diabetes mellitus without complications: Secondary | ICD-10-CM | POA: Diagnosis not present

## 2012-05-04 ENCOUNTER — Telehealth: Payer: Self-pay

## 2012-05-04 DIAGNOSIS — D696 Thrombocytopenia, unspecified: Secondary | ICD-10-CM

## 2012-05-04 NOTE — Telephone Encounter (Signed)
Please schedule patient for labs  ASAP;  CBC, DX thrombocytopenia If platelets are back to normal he will be clear (he already saw cardiology and they cleared him)

## 2012-05-04 NOTE — Telephone Encounter (Signed)
Pt wife is asking for husband's medical clearance for pt leg amputation after labs next week. Pt wife advises pt platelets are low. Pt wife states Dr. Ashok Croon  States pt needs other leg amputated and needs medical clearance. Pt wife advising you in advance due to appt only for lab and not going to see you next week. Plz advise      MW

## 2012-05-04 NOTE — Telephone Encounter (Signed)
Spoke with pt's wife. Scheduled labs for 10.15 & entered orders.

## 2012-05-05 ENCOUNTER — Other Ambulatory Visit (INDEPENDENT_AMBULATORY_CARE_PROVIDER_SITE_OTHER): Payer: Medicare Other

## 2012-05-05 DIAGNOSIS — D696 Thrombocytopenia, unspecified: Secondary | ICD-10-CM

## 2012-05-05 LAB — CBC WITH DIFFERENTIAL/PLATELET
Basophils Absolute: 0 10*3/uL (ref 0.0–0.1)
Eosinophils Relative: 5.6 % — ABNORMAL HIGH (ref 0.0–5.0)
Lymphs Abs: 2 10*3/uL (ref 0.7–4.0)
Monocytes Absolute: 0.4 10*3/uL (ref 0.1–1.0)
Monocytes Relative: 7.2 % (ref 3.0–12.0)
Neutrophils Relative %: 52.8 % (ref 43.0–77.0)
Platelets: 133 10*3/uL — ABNORMAL LOW (ref 150.0–400.0)
RDW: 18.5 % — ABNORMAL HIGH (ref 11.5–14.6)
WBC: 6 10*3/uL (ref 4.5–10.5)

## 2012-05-06 DIAGNOSIS — E119 Type 2 diabetes mellitus without complications: Secondary | ICD-10-CM | POA: Diagnosis not present

## 2012-05-06 DIAGNOSIS — I739 Peripheral vascular disease, unspecified: Secondary | ICD-10-CM | POA: Diagnosis not present

## 2012-05-06 DIAGNOSIS — Z4789 Encounter for other orthopedic aftercare: Secondary | ICD-10-CM | POA: Diagnosis not present

## 2012-05-06 DIAGNOSIS — G8220 Paraplegia, unspecified: Secondary | ICD-10-CM | POA: Diagnosis not present

## 2012-05-06 DIAGNOSIS — I10 Essential (primary) hypertension: Secondary | ICD-10-CM | POA: Diagnosis not present

## 2012-05-06 DIAGNOSIS — G35 Multiple sclerosis: Secondary | ICD-10-CM | POA: Diagnosis not present

## 2012-05-08 ENCOUNTER — Ambulatory Visit: Payer: Medicare Other | Admitting: Physical Medicine & Rehabilitation

## 2012-05-11 ENCOUNTER — Encounter: Payer: Self-pay | Admitting: Physical Medicine & Rehabilitation

## 2012-05-11 ENCOUNTER — Ambulatory Visit (HOSPITAL_BASED_OUTPATIENT_CLINIC_OR_DEPARTMENT_OTHER): Payer: Medicare Other | Admitting: Physical Medicine & Rehabilitation

## 2012-05-11 ENCOUNTER — Encounter: Payer: Medicare Other | Attending: Physical Medicine & Rehabilitation

## 2012-05-11 VITALS — BP 128/80 | HR 32 | Resp 14 | Ht 71.0 in | Wt 145.0 lb

## 2012-05-11 DIAGNOSIS — G35 Multiple sclerosis: Secondary | ICD-10-CM | POA: Diagnosis not present

## 2012-05-11 DIAGNOSIS — M25579 Pain in unspecified ankle and joints of unspecified foot: Secondary | ICD-10-CM | POA: Insufficient documentation

## 2012-05-11 DIAGNOSIS — M21549 Acquired clubfoot, unspecified foot: Secondary | ICD-10-CM | POA: Diagnosis not present

## 2012-05-11 DIAGNOSIS — S78119A Complete traumatic amputation at level between unspecified hip and knee, initial encounter: Secondary | ICD-10-CM | POA: Diagnosis not present

## 2012-05-11 DIAGNOSIS — G8220 Paraplegia, unspecified: Secondary | ICD-10-CM

## 2012-05-11 DIAGNOSIS — Z89619 Acquired absence of unspecified leg above knee: Secondary | ICD-10-CM

## 2012-05-11 DIAGNOSIS — M79609 Pain in unspecified limb: Secondary | ICD-10-CM | POA: Diagnosis not present

## 2012-05-11 NOTE — Patient Instructions (Signed)
Please have Dr. Imogene Burn consult me postoperatively

## 2012-05-11 NOTE — Progress Notes (Signed)
Subjective:    Patient ID: Jaime [name], male    DOB: [birth year], 71 y.o.   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 He was admitted on March 11, 2012, for left CFA to  tibioperoneal trunk bypass by Dr. Imogene Burn, postop had loss of pulse due to  acute thrombosis of tibioperoneal trunk with concomitant thrombosis of  distal femorotibioperoneal bypass. He was taken back to OR for  thrombectomy late that p.m., but had minimal perfusion of foot, left AKA  was advised and the patient may require right AKA in the future if he is  symptomatic. The patient was agreeable to procdure and underwent left  AKA on March 17, 2012.   Planning to undergo right lower extremity amputation sometime in the next couple weeks. Platelet counts have been low and this is being monitored prior to scheduling surgery  Off of narcotic analgesics. Still confused. Taking baclofen and gabapentin. Sometimes does not recognize wife Using 2 person assist to get in and out of bed  Pain Inventory Average Pain 10 Pain Right Now 0 My pain is aching  In the last 24 hours, has pain interfered with the following? General activity 5 Relation with others 5 Enjoyment of life 5 What TIME of day is your pain at its worst? all the time Sleep (in general) Fair  Pain is worse with: sitting Pain improves with: rest Relief from Meds: 7  Mobility how many minutes can you walk? 0 ability to climb steps?  no do you drive?  no  Function disabled: date disabled 65 retired I need assistance with the following:  feeding, dressing, bathing, toileting, meal prep, household duties and shopping  Neuro/Psych bladder control problems weakness tingling confusion  Prior Studies left leg amputation  Physicians involved in your care Any changes since  last visit?  no   Family History  Problem Relation Age of Onset  . Hypertension    . Heart attack Mother 83  . Heart disease Mother   . Stroke Mother   . Hyperlipidemia Mother   . Hypertension Mother   . Colon cancer Neg Hx   . Prostate cancer Neg Hx   . COPD Father   . Peripheral vascular disease Father   . Diabetes Brother   . Heart disease Brother   . Hypertension Brother   . Heart attack Brother   . Diabetes Daughter    History   Social History  . Marital Status: Married    Spouse Name: N/A    Number of Children: 2  . Years of Education: N/A   Occupational History  . disable    Social History Main Topics  . Smoking status: Former Smoker -- 50 years    Types: Cigarettes  . Smokeless tobacco: Never Used   Comment: pt states that he is using E-cigs  . Alcohol Use: No  . Drug Use: No  . Sexually Active: None   Other Topics Concern  . None   Social History Narrative   Lives w/ wife   Past Surgical History  Procedure Date  . Hernia repair 1990    (R)  . Coronary angioplasty with stent placement 05-01-2006    OCCLUDED CIRCUMFLEX -- TAXUS STENT PLACMENT  AND RESIDUAL 80% LAD,  50% RCA  . Thoracentesis 2008    PLEURAL EFFUSION  .  Cystoscopy 07/30/2011    Procedure: CYSTOSCOPY;  Surgeon: Lindaann Slough, MD;  Location: [facility];  Service: Urology;  Laterality: N/A;  . Transurethral resection of prostate 07/30/2011    Procedure: TRANSURETHRAL RESECTION OF THE PROSTATE (TURP);  Surgeon: Lindaann Slough, MD;  Location: [facility];  Service: Urology;  Laterality: N/A;  . Femoral-tibial bypass graft 03/11/2012    Procedure: BYPASS GRAFT FEMORAL-TIBIAL ARTERY;  Surgeon: Fransisco Hertz, MD;  Location: [facility] OR;  Service: Vascular;  Laterality: Left;  Left Femoral -Tibial trunk bypass, Endarterectomy of Tibial- Peroneal trunk with vein angioplasty.  . Intraoperative arteriogram 03/11/2012    Procedure: INTRA OPERATIVE ARTERIOGRAM;  Surgeon: Fransisco Hertz, MD;  Location: [facility] OR;  Service: Vascular;  Laterality: Left;  . Femoral-popliteal bypass graft 03/11/2012    Procedure: BYPASS GRAFT FEMORAL-POPLITEAL ARTERY;  Surgeon: Fransisco Hertz, MD;  Location: [facility] OR;  Service: Vascular;  Laterality: Left;  embolectomy left lower leg  . Amputation 03/17/2012    Procedure: AMPUTATION ABOVE KNEE;  Surgeon: Fransisco Hertz, MD;  Location: [facility] OR;  Service: Vascular;  Laterality: Left;   Past Medical History  Diagnosis Date  . Hyperlipidemia   . Cardiomyopathy, ischemic     EF 45% per ECHO 2008  //   EF 25%, echo, August, 2013  . Pulmonary hypertension     moderate ECHO Jan 2008  . Systolic heart failure   . Multiple sclerosis DX 1993    NEUROLOGIST-  DR DOHMEIER -- LAST VISIT 11-20-2010  NOTE W/ CHART  . Lung nodule     resolved 11-2006 CT Chest  . Tobacco abuse   . Increased prostate specific antigen (PSA) velocity   . Urinary retention     dx ~ 2-12, like from MS, now with a catheter, saw urology  . Ejection fraction      EF 45%, echo, 2008  //   EF 25%, echo, August, 2013  . Cellulitis   . Pre-syncope     Near syncope, ER visit, October, 2011  . Carotid artery disease     Doppler, February, 2012, 0-39% bilateral,Mild smooth plaque  . Chronic indwelling foley catheter   . Scoliosis associated with other condition   . Hemiparesis   . Fatigue SEVERE  . Weakness   . Impotence   . MI, acute, non ST segment elevation 2007    S/P PCI WITH X1 STENT (TAXUS DRUG-ELUTING) LEFT CIRCUMFLEX  . Echocardiogram abnormal 08-17-2006    SEVERE HYPOKINESIS OF INFERIOR , POSTEROR WALLS OF BASE AND MID VENTRICLE AND MOD. PULMONARY HTN  . Diabetes mellitus     INSULIN-DEPENDANT  . Hypertension   . H/O pleural effusion 2008    POST THORACENTESIS  . History of colon polyps PRECANCEROUS  . Insomnia   . Ingrown right big toenail CHRONIC    PER PT VERY SORE  . CAD (coronary artery disease) CARDIOLOGIST- DR KATZ-- VISIT 06-05-2011 IN [facility]    non-STEMI,  2007.Marland Kitchenoccluded circumflex.. Taxus stent placed...residual 80% LAD...50% RCA  . Pneumonia     hx of  . Urinary tract infection     hx of  . Neuromuscular disorder     multiple sclerosis  . Atherosclerotic PVD with ulceration     left foot  . S/P AKA (above knee amputation)     left AKA August, 2013, after vascular surgery failed  . Preop cardiovascular exam     Cardiac clearance for possible right AKA, September, 2013   BP 59/53  Pulse 32  Resp 14  Ht 5\' 11"  (1.803 m)  Wt 145 lb (65.772 kg)  BMI 20.22 kg/m2  SpO2 98%     Review of Systems  Musculoskeletal: Positive for myalgias and arthralgias.  Neurological: Positive for weakness.  Psychiatric/Behavioral: Positive for confusion.  All other systems reviewed and are negative.       Objective:   Physical Exam  Cardiovascular: Exam reveals decreased pulses.   Pulses:      Dorsalis pedis pulses are 0 on the right side.       Posterior tibial pulses are 0 on the right side.  Musculoskeletal:       Right hip: He exhibits decreased range of motion.       Decreased internal rotation of the right hip External rotation is normal Mild pain with internal and external rotation of the hip. No crepitus  Left AKA  Right lower extremity with 0/5 strength in the ankle dorsiflexor and plantar flexor 0 in the hip extensor and knee extensor    4/5 right deltoid, biceps, triceps, grip 5/5 in the left deltoid, biceps, triceps, grip      Assessment & Plan:  1. Multiple sclerosis with chronic paraplegia  Some right upper extremity weakness developing, CT of the head was negative for CVA or any new lesions. 2. Peripheral vascular disease status post left AKA and now is pending right lower ext amputation. Will need to reconsult PMNR postoperatively. If his wife is planning to bring him home he may benefit from another round of inpatient rehabilitation in the hospital otherwise may need to go to SNF postoperatively.

## 2012-05-12 ENCOUNTER — Other Ambulatory Visit: Payer: Medicare Other

## 2012-05-12 DIAGNOSIS — E119 Type 2 diabetes mellitus without complications: Secondary | ICD-10-CM | POA: Diagnosis not present

## 2012-05-12 DIAGNOSIS — I739 Peripheral vascular disease, unspecified: Secondary | ICD-10-CM | POA: Diagnosis not present

## 2012-05-12 DIAGNOSIS — G35 Multiple sclerosis: Secondary | ICD-10-CM | POA: Diagnosis not present

## 2012-05-12 DIAGNOSIS — I10 Essential (primary) hypertension: Secondary | ICD-10-CM | POA: Diagnosis not present

## 2012-05-12 DIAGNOSIS — Z4789 Encounter for other orthopedic aftercare: Secondary | ICD-10-CM | POA: Diagnosis not present

## 2012-05-12 DIAGNOSIS — G8220 Paraplegia, unspecified: Secondary | ICD-10-CM | POA: Diagnosis not present

## 2012-05-13 DIAGNOSIS — Z4789 Encounter for other orthopedic aftercare: Secondary | ICD-10-CM | POA: Diagnosis not present

## 2012-05-13 DIAGNOSIS — I10 Essential (primary) hypertension: Secondary | ICD-10-CM | POA: Diagnosis not present

## 2012-05-13 DIAGNOSIS — G8220 Paraplegia, unspecified: Secondary | ICD-10-CM | POA: Diagnosis not present

## 2012-05-13 DIAGNOSIS — G35 Multiple sclerosis: Secondary | ICD-10-CM | POA: Diagnosis not present

## 2012-05-13 DIAGNOSIS — I739 Peripheral vascular disease, unspecified: Secondary | ICD-10-CM | POA: Diagnosis not present

## 2012-05-13 DIAGNOSIS — E119 Type 2 diabetes mellitus without complications: Secondary | ICD-10-CM | POA: Diagnosis not present

## 2012-05-13 NOTE — Addendum Note (Signed)
Addended by: Marrion Coy L on: 05/13/2012 11:26 AM   Modules accepted: Orders

## 2012-05-15 DIAGNOSIS — E119 Type 2 diabetes mellitus without complications: Secondary | ICD-10-CM | POA: Diagnosis not present

## 2012-05-15 DIAGNOSIS — G35 Multiple sclerosis: Secondary | ICD-10-CM | POA: Diagnosis not present

## 2012-05-15 DIAGNOSIS — I739 Peripheral vascular disease, unspecified: Secondary | ICD-10-CM | POA: Diagnosis not present

## 2012-05-15 DIAGNOSIS — Z4789 Encounter for other orthopedic aftercare: Secondary | ICD-10-CM | POA: Diagnosis not present

## 2012-05-15 DIAGNOSIS — I10 Essential (primary) hypertension: Secondary | ICD-10-CM | POA: Diagnosis not present

## 2012-05-15 DIAGNOSIS — G8220 Paraplegia, unspecified: Secondary | ICD-10-CM | POA: Diagnosis not present

## 2012-05-18 ENCOUNTER — Telehealth: Payer: Self-pay

## 2012-05-18 DIAGNOSIS — IMO0001 Reserved for inherently not codable concepts without codable children: Secondary | ICD-10-CM

## 2012-05-18 DIAGNOSIS — I739 Peripheral vascular disease, unspecified: Secondary | ICD-10-CM

## 2012-05-18 NOTE — Telephone Encounter (Signed)
Pt wife states pt is very sore on his rt thigh. Pt wife states that's the leg they will be amputated. Pt wife states hard for pt to turn over.  Pt wife called: Notes Recorded by Wanda Plump, MD on 05/11/2012 at 9:12 AM Platelets continued to be slightly low. Chart reviewed, CT of the abdomen last year with normal spleen. I left a message for Dr Imogene Burn regards to CBC. If he feels comfortable performing surgery on him with this platelet level I am okay, otherwise we'll need to consult hematology as the mild thrombocytopenia etiology is not clear to me  Pt wife stated understanding. Pt wife states husband used all Oxy need okay Plz advise   MW

## 2012-05-19 MED ORDER — HYDROCODONE-ACETAMINOPHEN 5-500 MG PO TABS: 1.0000 | ORAL_TABLET | Freq: Four times a day (QID) | ORAL | Status: DC | PRN

## 2012-05-19 NOTE — Telephone Encounter (Signed)
We called the patient, when he needs is vicodin RF which he has taken before. RF done

## 2012-05-21 DIAGNOSIS — N401 Enlarged prostate with lower urinary tract symptoms: Secondary | ICD-10-CM | POA: Diagnosis not present

## 2012-05-26 DIAGNOSIS — I10 Essential (primary) hypertension: Secondary | ICD-10-CM | POA: Diagnosis not present

## 2012-05-26 DIAGNOSIS — E7800 Pure hypercholesterolemia, unspecified: Secondary | ICD-10-CM | POA: Diagnosis not present

## 2012-05-27 DIAGNOSIS — G35 Multiple sclerosis: Secondary | ICD-10-CM | POA: Diagnosis not present

## 2012-05-27 DIAGNOSIS — I739 Peripheral vascular disease, unspecified: Secondary | ICD-10-CM | POA: Diagnosis not present

## 2012-05-27 DIAGNOSIS — I10 Essential (primary) hypertension: Secondary | ICD-10-CM | POA: Diagnosis not present

## 2012-05-27 DIAGNOSIS — E119 Type 2 diabetes mellitus without complications: Secondary | ICD-10-CM | POA: Diagnosis not present

## 2012-05-27 DIAGNOSIS — G8220 Paraplegia, unspecified: Secondary | ICD-10-CM | POA: Diagnosis not present

## 2012-05-27 DIAGNOSIS — Z4789 Encounter for other orthopedic aftercare: Secondary | ICD-10-CM | POA: Diagnosis not present

## 2012-05-28 ENCOUNTER — Encounter: Payer: Self-pay | Admitting: Neurosurgery

## 2012-05-28 ENCOUNTER — Ambulatory Visit (INDEPENDENT_AMBULATORY_CARE_PROVIDER_SITE_OTHER): Payer: Medicare Other | Admitting: Neurosurgery

## 2012-05-28 ENCOUNTER — Other Ambulatory Visit: Payer: Self-pay

## 2012-05-28 ENCOUNTER — Encounter (HOSPITAL_COMMUNITY): Payer: Self-pay | Admitting: Pharmacy Technician

## 2012-05-28 VITALS — BP 140/78 | HR 55 | Resp 14 | Ht 71.0 in | Wt 148.0 lb

## 2012-05-28 DIAGNOSIS — L98499 Non-pressure chronic ulcer of skin of other sites with unspecified severity: Secondary | ICD-10-CM

## 2012-05-28 DIAGNOSIS — I739 Peripheral vascular disease, unspecified: Secondary | ICD-10-CM | POA: Diagnosis not present

## 2012-05-28 NOTE — Progress Notes (Signed)
[facility] [place] HISTORY AND PHYSICAL -PAD CC: Pending right above-the-knee amputation Referring Physician: Imogene Burn  History of Present Illness  Jaime [name] is a 71 y.o. male patient who presents with chief complaint of failed bypass right lower extremity with pending right above-the-knee amputation scheduled for 06/03/2012 with Dr. Imogene Burn. The patient is status post left AKA and has done well with that. The patient reports no change in his medical history and has had no other recent surgery.  Past Medical History  Diagnosis Date  . Hyperlipidemia   . Cardiomyopathy, ischemic     EF 45% per ECHO 2008  //   EF 25%, echo, August, 2013  . Pulmonary hypertension     moderate ECHO Jan 2008  . Systolic heart failure   . Multiple sclerosis DX 1993    NEUROLOGIST-  DR DOHMEIER -- LAST VISIT 11-20-2010  NOTE W/ CHART  . Lung nodule     resolved 11-2006 CT Chest  . Tobacco abuse   . Increased prostate specific antigen (PSA) velocity   . Urinary retention     dx ~ 2-12, like from MS, now with a catheter, saw urology  . Ejection fraction      EF 45%, echo, 2008  //   EF 25%, echo, August, 2013  . Cellulitis   . Pre-syncope     Near syncope, ER visit, October, 2011  . Carotid artery disease     Doppler, February, 2012, 0-39% bilateral,Mild smooth plaque  . Chronic indwelling foley catheter   . Scoliosis associated with other condition   . Hemiparesis   . Fatigue SEVERE  . Weakness   . Impotence   . MI, acute, non ST segment elevation 2007    S/P PCI WITH X1 STENT (TAXUS DRUG-ELUTING) LEFT CIRCUMFLEX  . Echocardiogram abnormal 08-17-2006    SEVERE HYPOKINESIS OF INFERIOR , POSTEROR WALLS OF BASE AND MID VENTRICLE AND MOD. PULMONARY HTN  . Diabetes mellitus     INSULIN-DEPENDANT  . Hypertension   . H/O pleural effusion 2008    POST THORACENTESIS  . History of colon polyps PRECANCEROUS  . Insomnia   . Ingrown right big toenail CHRONIC    PER PT VERY SORE  .  CAD (coronary artery disease) CARDIOLOGIST- DR KATZ-- VISIT 06-05-2011 IN [facility]    non-STEMI, 2007.Marland Kitchenoccluded circumflex.. Taxus stent placed...residual 80% LAD...50% RCA  . Pneumonia     hx of  . Urinary tract infection     hx of  . Neuromuscular disorder     multiple sclerosis  . Atherosclerotic PVD with ulceration     left foot  . S/P AKA (above knee amputation)     left AKA August, 2013, after vascular surgery failed  . Preop cardiovascular exam     Cardiac clearance for possible right AKA, September, 2013    Social History History  Substance Use Topics  . Smoking status: Former Smoker -- 50 years    Types: Cigarettes  . Smokeless tobacco: Never Used     Comment: pt states that he is using E-cigs  . Alcohol Use: No    Family History Family History  Problem Relation Age of Onset  . Hypertension    . Heart attack Mother 25  . Heart disease Mother   . Stroke Mother   . Hyperlipidemia Mother   . Hypertension Mother   . Colon cancer Neg Hx   . Prostate cancer Neg Hx   . COPD Father   . Peripheral  vascular disease Father   . Diabetes Brother   . Heart disease Brother   . Hypertension Brother   . Heart attack Brother   . Diabetes Daughter     ROS: [x]  Positive   [ ]  Denies  General:[ ]  Weight loss,  [ ]  Weight gain, [ ]  Fever, [ ]  chills Neurologic: [ ]  Dizziness, [ ]  Blackouts, [ ]  Seizure [ ]  Stroke, [ ]  "Mini stroke", [ ]  Slurred speech, [ ]  Temporary blindness;  [ ] weakness, [ ]  Hoarseness Cardiac: [ ]  Chest pain/pressure, [ ]  Shortness of breath at rest [ ]  Shortness of breath with exertion,  [ ]   Atrial fibrillation or irregular heartbeat Vascular:[ ]  Pain in legs with walking, [ ]  Pain in legs at rest ,[ ]  Pain in legs at night,  [ ]   Non-healing ulcer, [ ]  Blood clot in vein/DVT,   Pulmonary: [ ]  Home oxygen, [ ]   Productive cough, [ ]  Coughing up blood,  [ ]  Asthma,  [ ]  Wheezing Musculoskeletal:  [ ]  Arthritis, [ ]  Low back pain,  [ ]  Joint  pain Hematologic:[ ]  Easy Bruising, [ ]  Anemia; [ ]  Hepatitis Gastrointestinal: [ ]  Blood in stool,  [ ]  Gastroesophageal Reflux, [ ]  Trouble swallowing Urinary: [ ]  chronic Kidney disease, [ ]  on HD, [ ]  Burning with urination, [ ]  Frequent urination, [ ]  Difficulty urinating;  Skin: [ ]  Rashes, [ ]  Wounds    Allergies  Allergen Reactions  . Bee Venom Anaphylaxis    Current Outpatient Prescriptions  Medication Sig Dispense Refill  . amantadine (SYMMETREL) 100 MG capsule Take 1 capsule (100 mg total) by mouth 2 (two) times daily.  60 capsule  1  . aspirin EC 81 MG tablet Take 81 mg by mouth daily.      . B-D ULTRAFINE III SHORT PEN 31G X 8 MM MISC       . baclofen (LIORESAL) 5 mg TABS Take 0.5 tablets (5 mg total) by mouth 2 (two) times daily.  60 each  1  . diclofenac sodium (VOLTAREN) 1 % GEL Apply 1 application topically 4 (four) times daily as needed. For pain       . gabapentin (NEURONTIN) 300 MG capsule Take 300 mg by mouth 4 (four) times daily.       Marland Kitchen HYDROcodone-acetaminophen (VICODIN) 5-500 MG per tablet Take 1 tablet by mouth every 6 (six) hours as needed for pain.  40 tablet  0  . insulin aspart protamine-insulin aspart (NOVOLOG 70/30) (70-30) 100 UNIT/ML injection Inject 4 Units into the skin daily with supper.  10 mL  1  . insulin aspart protamine-insulin aspart (NOVOLOG 70/30) (70-30) 100 UNIT/ML injection Inject 4 Units into the skin daily with breakfast.  10 mL  1  . meloxicam (MOBIC) 15 MG tablet Take 15 mg by mouth daily.       . metoprolol succinate (TOPROL-XL) 12.5 mg TB24 Take 1.5 tablets (37.5 mg total) by mouth daily.  90 tablet  1  . nortriptyline (PAMELOR) 10 MG capsule Take 1 capsule (10 mg total) by mouth at bedtime.  30 capsule  1  . pantoprazole (PROTONIX) 40 MG tablet Take 1 tablet (40 mg total) by mouth daily at 12 noon.  30 tablet  1  . simvastatin (ZOCOR) 40 MG tablet Take 1 tablet (40 mg total) by mouth at bedtime.  90 tablet  1    Physical  Examination  There were no vitals filed for this visit.  General: A&O x 3, WDWN,  Gait: Wheelchair with assist Eyes: PERRLA, Pulmonary: CTAB, without wheezes , rales or rhonchi Cardiac: regular Rythm , without murmur                                                                  RIGHT                                       LEFT  CAROTID BRUIT Negative Negative  VASCULAR EXAM: Extremities with ischemic changes right lower extremity status post failed bypass without Gangrene of right lower extremity; without open wounds; without drainage.                                                                                                    LOWER EXTREMITY PULSES           RIGHT                                      LEFT      FEMORAL  present and palpable  present and palpable        POPLITEAL  absent    absent        POSTERIOR TIBIAL  absent    absent         DORSALIS PEDIS      ANTERIOR TIBIAL absent    absent         PERONEAL  not palpated    not palpated    Abdomen: soft, NT, no masses Skin: no rashes, ulcers noted Musculoskeletal: no muscle wasting or atrophy  Neurologic: A&O X 3; Appropriate Affect ; SENSATION: normal; MOTOR FUNCTION:  moving all extremities equally. Speech is fluent/normal    Non-Invasive Vascular Imaging: DATE: 05/28/2012 No further diagnostics performed.  Outside Studies/Documentation  ASSESSMENT: Jaime [name] is a 71 y.o. male who presents with: right lower PAD WITH pending right above-the-knee amputation status post failed revascularization.   PLAN: Based on the patient's vascular studies and examination, pt will return to clinic for routine postop followup pending his right above-the-knee amputation 06/03/2012. This will be based on his discharge date and Dr. Nicky Pugh recommendations. The patient's questions were encouraged and answered he and his wife are in agreement with this plan.  Lauree Chandler ANP   Clinic MD: : Darrick Penna

## 2012-05-31 DIAGNOSIS — I2510 Atherosclerotic heart disease of native coronary artery without angina pectoris: Secondary | ICD-10-CM | POA: Diagnosis not present

## 2012-05-31 DIAGNOSIS — G35 Multiple sclerosis: Secondary | ICD-10-CM | POA: Diagnosis not present

## 2012-05-31 DIAGNOSIS — E119 Type 2 diabetes mellitus without complications: Secondary | ICD-10-CM | POA: Diagnosis not present

## 2012-05-31 DIAGNOSIS — G8220 Paraplegia, unspecified: Secondary | ICD-10-CM | POA: Diagnosis not present

## 2012-05-31 DIAGNOSIS — I10 Essential (primary) hypertension: Secondary | ICD-10-CM | POA: Diagnosis not present

## 2012-05-31 DIAGNOSIS — I739 Peripheral vascular disease, unspecified: Secondary | ICD-10-CM | POA: Diagnosis not present

## 2012-06-01 ENCOUNTER — Other Ambulatory Visit: Payer: Self-pay | Admitting: Physical Medicine & Rehabilitation

## 2012-06-01 ENCOUNTER — Other Ambulatory Visit: Payer: Self-pay

## 2012-06-02 ENCOUNTER — Encounter (HOSPITAL_COMMUNITY): Payer: Self-pay

## 2012-06-02 ENCOUNTER — Encounter (HOSPITAL_COMMUNITY)
Admission: RE | Admit: 2012-06-02 | Discharge: 2012-06-02 | Disposition: A | Discharge status: Home / Self Care | Payer: Medicare Other | Source: Ambulatory Visit | Attending: Vascular Surgery | Admitting: Vascular Surgery

## 2012-06-02 LAB — BASIC METABOLIC PANEL
BUN: 20 mg/dL (ref 6–23)
Chloride: 105 mEq/L (ref 96–112)
Creatinine, Ser: 0.77 mg/dL (ref 0.50–1.35)
GFR calc Af Amer: >90 mL/min (ref >90)
GFR calc non Af Amer: 90 mL/min — ABNORMAL LOW (ref >90)
Potassium: 4 mEq/L (ref 3.5–5.1)

## 2012-06-02 LAB — CBC
HCT: 43.2 % (ref 39.0–52.0)
MCHC: 32.6 g/dL (ref 30.0–36.0)
MCV: 80 fL (ref 78.0–100.0)
Platelets: 146 10*3/uL — ABNORMAL LOW (ref 150–400)
RDW: 17.8 % — ABNORMAL HIGH (ref 11.5–15.5)
WBC: 5.5 10*3/uL (ref 4.0–10.5)

## 2012-06-02 LAB — PROTIME-INR: Prothrombin Time: 12.9 seconds (ref 11.6–15.2)

## 2012-06-02 LAB — SURGICAL PCR SCREEN
MRSA, PCR: NEGATIVE
Staphylococcus aureus: NEGATIVE

## 2012-06-02 MED ORDER — CEFAZOLIN SODIUM-DEXTROSE 2-3 GM-% IV SOLR
2.0000 g | Freq: Once | INTRAVENOUS | Status: AC
  Administered 2012-06-03: 2 g via INTRAVENOUS
  Filled 2012-06-02: qty 50

## 2012-06-02 NOTE — Pre-Procedure Instructions (Addendum)
20 Jaime [name]  06/02/2012   Your procedure is scheduled on:  06-03-2012      Report to [facility] at 6:30 [facility] Elevators to the 3rd floor  Call this number if you have problems the morning of surgery: [phone]   Remember:   Do not eat food or drink:After Midnight.      Medication to take morning of surgery with a sip of water. Pain medication as needed,protonix,metoprolol,symmetrel    Do not wear jewelry,.  Do not wear lotions, powders, or perfumes. You may wear deodorant.  Do not shave 48 hours prior to surgery. Men may shave face and neck.  Do not bring valuables to the hospital.  Contacts, dentures or bridgework may not be worn into surgery.  Leave suitcase in the car. After surgery it may be brought to your room.   For patients admitted to the hospital, checkout time is 11:00 AM the day of discharge.   Patients discharged the day of surgery will not be allowed to drive home.    Special Instructions: Wash with CHG soap tonight and in the morning   Please read over the following fact sheets that you were given: Pain Booklet, Coughing and Deep Breathing, MRSA Information and Surgical Site Infection Prevention

## 2012-06-02 NOTE — Progress Notes (Signed)
06/02/12 1330  OBSTRUCTIVE SLEEP APNEA  Score 4 or greater  Results sent to PCP

## 2012-06-03 ENCOUNTER — Encounter (HOSPITAL_COMMUNITY): Payer: Self-pay | Admitting: Surgery

## 2012-06-03 ENCOUNTER — Encounter (HOSPITAL_COMMUNITY)
Admission: RE | Disposition: A | Discharge status: Home Health | Payer: Self-pay | Source: Ambulatory Visit | Attending: Internal Medicine

## 2012-06-03 ENCOUNTER — Encounter (HOSPITAL_COMMUNITY): Payer: Self-pay | Admitting: Critical Care Medicine

## 2012-06-03 ENCOUNTER — Inpatient Hospital Stay (HOSPITAL_COMMUNITY)
Admission: RE | Admit: 2012-06-03 | Discharge: 2012-06-05 | DRG: 240 | Disposition: A | Discharge status: Home Health | Payer: Medicare Other | Source: Ambulatory Visit | Attending: Internal Medicine | Admitting: Internal Medicine

## 2012-06-03 ENCOUNTER — Telehealth: Payer: Self-pay | Admitting: Vascular Surgery

## 2012-06-03 ENCOUNTER — Inpatient Hospital Stay (HOSPITAL_COMMUNITY): Payer: Medicare Other | Admitting: Critical Care Medicine

## 2012-06-03 DIAGNOSIS — Z01810 Encounter for preprocedural cardiovascular examination: Secondary | ICD-10-CM

## 2012-06-03 DIAGNOSIS — IMO0001 Reserved for inherently not codable concepts without codable children: Secondary | ICD-10-CM

## 2012-06-03 DIAGNOSIS — I70209 Unspecified atherosclerosis of native arteries of extremities, unspecified extremity: Secondary | ICD-10-CM

## 2012-06-03 DIAGNOSIS — I2789 Other specified pulmonary heart diseases: Secondary | ICD-10-CM | POA: Diagnosis present

## 2012-06-03 DIAGNOSIS — I2510 Atherosclerotic heart disease of native coronary artery without angina pectoris: Secondary | ICD-10-CM | POA: Diagnosis present

## 2012-06-03 DIAGNOSIS — N390 Urinary tract infection, site not specified: Secondary | ICD-10-CM

## 2012-06-03 DIAGNOSIS — I70269 Atherosclerosis of native arteries of extremities with gangrene, unspecified extremity: Principal | ICD-10-CM | POA: Diagnosis present

## 2012-06-03 DIAGNOSIS — I252 Old myocardial infarction: Secondary | ICD-10-CM

## 2012-06-03 DIAGNOSIS — L98499 Non-pressure chronic ulcer of skin of other sites with unspecified severity: Secondary | ICD-10-CM

## 2012-06-03 DIAGNOSIS — I5022 Chronic systolic (congestive) heart failure: Secondary | ICD-10-CM | POA: Diagnosis present

## 2012-06-03 DIAGNOSIS — Z79899 Other long term (current) drug therapy: Secondary | ICD-10-CM

## 2012-06-03 DIAGNOSIS — Z9861 Coronary angioplasty status: Secondary | ICD-10-CM

## 2012-06-03 DIAGNOSIS — Z993 Dependence on wheelchair: Secondary | ICD-10-CM

## 2012-06-03 DIAGNOSIS — Z01812 Encounter for preprocedural laboratory examination: Secondary | ICD-10-CM

## 2012-06-03 DIAGNOSIS — G35 Multiple sclerosis: Secondary | ICD-10-CM | POA: Diagnosis present

## 2012-06-03 DIAGNOSIS — Z794 Long term (current) use of insulin: Secondary | ICD-10-CM | POA: Diagnosis not present

## 2012-06-03 DIAGNOSIS — L089 Local infection of the skin and subcutaneous tissue, unspecified: Secondary | ICD-10-CM | POA: Diagnosis not present

## 2012-06-03 DIAGNOSIS — I255 Ischemic cardiomyopathy: Secondary | ICD-10-CM

## 2012-06-03 DIAGNOSIS — I998 Other disorder of circulatory system: Secondary | ICD-10-CM | POA: Diagnosis present

## 2012-06-03 DIAGNOSIS — I2589 Other forms of chronic ischemic heart disease: Secondary | ICD-10-CM

## 2012-06-03 DIAGNOSIS — E1142 Type 2 diabetes mellitus with diabetic polyneuropathy: Secondary | ICD-10-CM | POA: Diagnosis present

## 2012-06-03 DIAGNOSIS — E785 Hyperlipidemia, unspecified: Secondary | ICD-10-CM | POA: Diagnosis present

## 2012-06-03 DIAGNOSIS — Z87891 Personal history of nicotine dependence: Secondary | ICD-10-CM | POA: Diagnosis not present

## 2012-06-03 DIAGNOSIS — M4120 Other idiopathic scoliosis, site unspecified: Secondary | ICD-10-CM | POA: Diagnosis present

## 2012-06-03 DIAGNOSIS — S78119A Complete traumatic amputation at level between unspecified hip and knee, initial encounter: Secondary | ICD-10-CM | POA: Diagnosis not present

## 2012-06-03 DIAGNOSIS — G8190 Hemiplegia, unspecified affecting unspecified side: Secondary | ICD-10-CM | POA: Diagnosis not present

## 2012-06-03 DIAGNOSIS — E119 Type 2 diabetes mellitus without complications: Secondary | ICD-10-CM

## 2012-06-03 DIAGNOSIS — I739 Peripheral vascular disease, unspecified: Secondary | ICD-10-CM

## 2012-06-03 DIAGNOSIS — I5020 Unspecified systolic (congestive) heart failure: Secondary | ICD-10-CM

## 2012-06-03 DIAGNOSIS — R5381 Other malaise: Secondary | ICD-10-CM | POA: Diagnosis present

## 2012-06-03 DIAGNOSIS — I10 Essential (primary) hypertension: Secondary | ICD-10-CM

## 2012-06-03 DIAGNOSIS — M79609 Pain in unspecified limb: Secondary | ICD-10-CM | POA: Diagnosis not present

## 2012-06-03 DIAGNOSIS — Z720 Tobacco use: Secondary | ICD-10-CM

## 2012-06-03 DIAGNOSIS — Z7982 Long term (current) use of aspirin: Secondary | ICD-10-CM

## 2012-06-03 DIAGNOSIS — I6529 Occlusion and stenosis of unspecified carotid artery: Secondary | ICD-10-CM | POA: Diagnosis present

## 2012-06-03 DIAGNOSIS — Z89619 Acquired absence of unspecified leg above knee: Secondary | ICD-10-CM

## 2012-06-03 DIAGNOSIS — F17200 Nicotine dependence, unspecified, uncomplicated: Secondary | ICD-10-CM

## 2012-06-03 DIAGNOSIS — I999 Unspecified disorder of circulatory system: Secondary | ICD-10-CM | POA: Diagnosis not present

## 2012-06-03 DIAGNOSIS — R9430 Abnormal result of cardiovascular function study, unspecified: Secondary | ICD-10-CM

## 2012-06-03 DIAGNOSIS — M25519 Pain in unspecified shoulder: Secondary | ICD-10-CM

## 2012-06-03 DIAGNOSIS — I509 Heart failure, unspecified: Secondary | ICD-10-CM

## 2012-06-03 DIAGNOSIS — R339 Retention of urine, unspecified: Secondary | ICD-10-CM

## 2012-06-03 DIAGNOSIS — N400 Enlarged prostate without lower urinary tract symptoms: Secondary | ICD-10-CM

## 2012-06-03 DIAGNOSIS — I658 Occlusion and stenosis of other precerebral arteries: Secondary | ICD-10-CM | POA: Diagnosis present

## 2012-06-03 DIAGNOSIS — M629 Disorder of muscle, unspecified: Secondary | ICD-10-CM

## 2012-06-03 DIAGNOSIS — E1149 Type 2 diabetes mellitus with other diabetic neurological complication: Secondary | ICD-10-CM | POA: Diagnosis present

## 2012-06-03 DIAGNOSIS — R911 Solitary pulmonary nodule: Secondary | ICD-10-CM

## 2012-06-03 DIAGNOSIS — I272 Pulmonary hypertension, unspecified: Secondary | ICD-10-CM

## 2012-06-03 HISTORY — PX: AMPUTATION: SHX166

## 2012-06-03 LAB — GLUCOSE, CAPILLARY
Glucose-Capillary: 120 mg/dL — ABNORMAL HIGH (ref 70–99)
Glucose-Capillary: 145 mg/dL — ABNORMAL HIGH (ref 70–99)
Glucose-Capillary: 177 mg/dL — ABNORMAL HIGH (ref 70–99)

## 2012-06-03 LAB — CBC
HCT: 41.7 % (ref 39.0–52.0)
Hemoglobin: 13.9 g/dL (ref 13.0–17.0)
MCH: 26.7 pg (ref 26.0–34.0)
MCHC: 33.3 g/dL (ref 30.0–36.0)
MCV: 80.2 fL (ref 78.0–100.0)

## 2012-06-03 SURGERY — AMPUTATION, ABOVE KNEE
Anesthesia: General | Site: Leg Upper | Laterality: Right | Wound class: Clean

## 2012-06-03 MED ORDER — ACETAMINOPHEN 325 MG PO TABS: 325.0000 mg | ORAL_TABLET | ORAL | Status: DC | PRN

## 2012-06-03 MED ORDER — METOPROLOL TARTRATE 1 MG/ML IV SOLN: 2.0000 mg | INTRAVENOUS | Status: DC | PRN

## 2012-06-03 MED ORDER — OXYCODONE HCL 5 MG/5ML PO SOLN: 5.0000 mg | Freq: Once | ORAL | Status: AC | PRN

## 2012-06-03 MED ORDER — GLYCOPYRROLATE 0.2 MG/ML IJ SOLN
INTRAMUSCULAR | Status: DC | PRN
  Administered 2012-06-03: 0.2 mg via INTRAVENOUS

## 2012-06-03 MED ORDER — SODIUM CHLORIDE 0.9 % IV SOLN: INTRAVENOUS | Status: DC

## 2012-06-03 MED ORDER — OXYCODONE HCL 5 MG PO TABS
5.0000 mg | ORAL_TABLET | Freq: Once | ORAL | Status: AC | PRN
  Administered 2012-06-03: 5 mg via ORAL

## 2012-06-03 MED ORDER — HYDROCODONE-ACETAMINOPHEN 5-325 MG PO TABS
1.0000 | ORAL_TABLET | ORAL | Status: DC | PRN
  Administered 2012-06-04 – 2012-06-05 (×2): 1 via ORAL
  Filled 2012-06-03 (×2): qty 1

## 2012-06-03 MED ORDER — LABETALOL HCL 5 MG/ML IV SOLN
10.0000 mg | INTRAVENOUS | Status: DC | PRN
  Filled 2012-06-03: qty 4

## 2012-06-03 MED ORDER — SODIUM CHLORIDE 0.9 % IJ SOLN
3.0000 mL | Freq: Two times a day (BID) | INTRAMUSCULAR | Status: DC
  Administered 2012-06-03 – 2012-06-04 (×2): 3 mL via INTRAVENOUS

## 2012-06-03 MED ORDER — BISACODYL 10 MG RE SUPP: 10.0000 mg | Freq: Every day | RECTAL | Status: DC | PRN

## 2012-06-03 MED ORDER — OXYCODONE HCL 5 MG PO TABS
ORAL_TABLET | ORAL | Status: AC
  Filled 2012-06-03: qty 1

## 2012-06-03 MED ORDER — BACITRACIN ZINC 500 UNIT/GM EX OINT
TOPICAL_OINTMENT | CUTANEOUS | Status: AC
  Filled 2012-06-03: qty 15

## 2012-06-03 MED ORDER — FENTANYL CITRATE 0.05 MG/ML IJ SOLN: 50.0000 ug | Freq: Once | INTRAMUSCULAR | Status: DC

## 2012-06-03 MED ORDER — LACTATED RINGERS IV SOLN
INTRAVENOUS | Status: DC | PRN
  Administered 2012-06-03 (×2): via INTRAVENOUS

## 2012-06-03 MED ORDER — EPHEDRINE SULFATE 50 MG/ML IJ SOLN
INTRAMUSCULAR | Status: DC | PRN
  Administered 2012-06-03: 10 mg via INTRAVENOUS
  Administered 2012-06-03: 25 mg via INTRAVENOUS
  Administered 2012-06-03: 10 mg via INTRAVENOUS
  Administered 2012-06-03: 15 mg via INTRAVENOUS
  Administered 2012-06-03: 10 mg via INTRAVENOUS

## 2012-06-03 MED ORDER — HYDROMORPHONE HCL PF 1 MG/ML IJ SOLN
0.2500 mg | INTRAMUSCULAR | Status: DC | PRN
  Administered 2012-06-03: 0.25 mg via INTRAVENOUS

## 2012-06-03 MED ORDER — ONDANSETRON HCL 4 MG/2ML IJ SOLN: 4.0000 mg | Freq: Four times a day (QID) | INTRAMUSCULAR | Status: DC | PRN

## 2012-06-03 MED ORDER — ASPIRIN EC 81 MG PO TBEC
81.0000 mg | DELAYED_RELEASE_TABLET | Freq: Every day | ORAL | Status: DC
  Administered 2012-06-04 – 2012-06-05 (×2): 81 mg via ORAL
  Filled 2012-06-03 (×3): qty 1

## 2012-06-03 MED ORDER — BACLOFEN 5 MG HALF TABLET
5.0000 mg | ORAL_TABLET | Freq: Two times a day (BID) | ORAL | Status: DC
  Administered 2012-06-03 – 2012-06-05 (×4): 5 mg via ORAL
  Filled 2012-06-03 (×6): qty 1

## 2012-06-03 MED ORDER — PHENYLEPHRINE HCL 10 MG/ML IJ SOLN
INTRAMUSCULAR | Status: DC | PRN
  Administered 2012-06-03 (×3): 80 ug via INTRAVENOUS

## 2012-06-03 MED ORDER — METOPROLOL SUCCINATE ER 25 MG PO TB24
37.5000 mg | ORAL_TABLET | Freq: Every day | ORAL | Status: DC
  Administered 2012-06-04 – 2012-06-05 (×2): 37.5 mg via ORAL
  Filled 2012-06-03 (×2): qty 1

## 2012-06-03 MED ORDER — DEXTROSE 5 % IV SOLN
1.5000 g | Freq: Two times a day (BID) | INTRAVENOUS | Status: AC
  Administered 2012-06-03 – 2012-06-04 (×2): 1.5 g via INTRAVENOUS
  Filled 2012-06-03 (×2): qty 1.5

## 2012-06-03 MED ORDER — MIDAZOLAM HCL 2 MG/2ML IJ SOLN: 1.0000 mg | INTRAMUSCULAR | Status: DC | PRN

## 2012-06-03 MED ORDER — ALUM & MAG HYDROXIDE-SIMETH 200-200-20 MG/5ML PO SUSP: 15.0000 mL | ORAL | Status: DC | PRN

## 2012-06-03 MED ORDER — AMANTADINE HCL 100 MG PO CAPS
100.0000 mg | ORAL_CAPSULE | Freq: Two times a day (BID) | ORAL | Status: DC
  Administered 2012-06-03 – 2012-06-05 (×4): 100 mg via ORAL
  Filled 2012-06-03 (×6): qty 1

## 2012-06-03 MED ORDER — MELOXICAM 15 MG PO TABS
15.0000 mg | ORAL_TABLET | Freq: Every day | ORAL | Status: DC
  Administered 2012-06-03 – 2012-06-05 (×3): 15 mg via ORAL
  Filled 2012-06-03 (×3): qty 1

## 2012-06-03 MED ORDER — NORTRIPTYLINE HCL 10 MG PO CAPS
10.0000 mg | ORAL_CAPSULE | Freq: Every day | ORAL | Status: DC
  Administered 2012-06-03 – 2012-06-04 (×2): 10 mg via ORAL
  Filled 2012-06-03 (×3): qty 1

## 2012-06-03 MED ORDER — PANTOPRAZOLE SODIUM 40 MG PO TBEC
40.0000 mg | DELAYED_RELEASE_TABLET | Freq: Every day | ORAL | Status: DC
  Administered 2012-06-04 – 2012-06-05 (×2): 40 mg via ORAL
  Filled 2012-06-03 (×2): qty 1

## 2012-06-03 MED ORDER — INSULIN ASPART PROT & ASPART (70-30 MIX) 100 UNIT/ML ~~LOC~~ SUSP
4.0000 [IU] | Freq: Every day | SUBCUTANEOUS | Status: DC
  Administered 2012-06-04: 4 [IU] via SUBCUTANEOUS

## 2012-06-03 MED ORDER — PHENOL 1.4 % MT LIQD: 1.0000 | OROMUCOSAL | Status: DC | PRN

## 2012-06-03 MED ORDER — LIDOCAINE HCL (CARDIAC) 20 MG/ML IV SOLN
INTRAVENOUS | Status: DC | PRN
  Administered 2012-06-03: 100 mg via INTRAVENOUS

## 2012-06-03 MED ORDER — ONDANSETRON HCL 4 MG/2ML IJ SOLN
INTRAMUSCULAR | Status: DC | PRN
  Administered 2012-06-03: 4 mg via INTRAVENOUS

## 2012-06-03 MED ORDER — INSULIN ASPART PROT & ASPART (70-30 MIX) 100 UNIT/ML ~~LOC~~ SUSP
30.0000 [IU] | Freq: Every day | SUBCUTANEOUS | Status: DC
  Administered 2012-06-04: 30 [IU] via SUBCUTANEOUS
  Filled 2012-06-03: qty 3

## 2012-06-03 MED ORDER — GABAPENTIN 300 MG PO CAPS
300.0000 mg | ORAL_CAPSULE | Freq: Four times a day (QID) | ORAL | Status: DC
  Administered 2012-06-03 – 2012-06-05 (×8): 300 mg via ORAL
  Filled 2012-06-03 (×11): qty 1

## 2012-06-03 MED ORDER — FENTANYL CITRATE 0.05 MG/ML IJ SOLN
INTRAMUSCULAR | Status: DC | PRN
  Administered 2012-06-03: 50 ug via INTRAVENOUS
  Administered 2012-06-03 (×5): 25 ug via INTRAVENOUS

## 2012-06-03 MED ORDER — HYDRALAZINE HCL 20 MG/ML IJ SOLN
10.0000 mg | INTRAMUSCULAR | Status: DC | PRN
  Filled 2012-06-03: qty 0.5

## 2012-06-03 MED ORDER — ONDANSETRON HCL 4 MG PO TABS: 4.0000 mg | ORAL_TABLET | Freq: Four times a day (QID) | ORAL | Status: DC | PRN

## 2012-06-03 MED ORDER — HYDROMORPHONE HCL PF 1 MG/ML IJ SOLN
INTRAMUSCULAR | Status: AC
  Filled 2012-06-03: qty 1

## 2012-06-03 MED ORDER — POTASSIUM CHLORIDE CRYS ER 20 MEQ PO TBCR: 20.0000 meq | EXTENDED_RELEASE_TABLET | Freq: Once | ORAL | Status: AC | PRN

## 2012-06-03 MED ORDER — INSULIN ASPART 100 UNIT/ML ~~LOC~~ SOLN
0.0000 [IU] | Freq: Three times a day (TID) | SUBCUTANEOUS | Status: DC
  Administered 2012-06-04 – 2012-06-05 (×3): 1 [IU] via SUBCUTANEOUS

## 2012-06-03 MED ORDER — SODIUM CHLORIDE 0.9 % IV SOLN
INTRAVENOUS | Status: DC
  Administered 2012-06-03 (×2): via INTRAVENOUS

## 2012-06-03 MED ORDER — MORPHINE SULFATE 2 MG/ML IJ SOLN
2.0000 mg | INTRAMUSCULAR | Status: DC | PRN
  Administered 2012-06-03 (×2): 4 mg via INTRAVENOUS
  Administered 2012-06-03 – 2012-06-04 (×2): 2 mg via INTRAVENOUS
  Filled 2012-06-03: qty 2
  Filled 2012-06-03: qty 1
  Filled 2012-06-03: qty 2
  Filled 2012-06-03 (×2): qty 1

## 2012-06-03 MED ORDER — ENOXAPARIN SODIUM 40 MG/0.4ML ~~LOC~~ SOLN
40.0000 mg | SUBCUTANEOUS | Status: DC
  Administered 2012-06-03 – 2012-06-05 (×3): 40 mg via SUBCUTANEOUS
  Filled 2012-06-03 (×3): qty 0.4

## 2012-06-03 MED ORDER — PROPOFOL 10 MG/ML IV BOLUS
INTRAVENOUS | Status: DC | PRN
  Administered 2012-06-03: 150 mg via INTRAVENOUS

## 2012-06-03 MED ORDER — GUAIFENESIN-DM 100-10 MG/5ML PO SYRP: 15.0000 mL | ORAL_SOLUTION | ORAL | Status: DC | PRN

## 2012-06-03 MED ORDER — 0.9 % SODIUM CHLORIDE (POUR BTL) OPTIME
TOPICAL | Status: DC | PRN
  Administered 2012-06-03: 1000 mL

## 2012-06-03 MED ORDER — ACETAMINOPHEN 650 MG RE SUPP: 325.0000 mg | RECTAL | Status: DC | PRN

## 2012-06-03 MED ORDER — SIMVASTATIN 40 MG PO TABS
40.0000 mg | ORAL_TABLET | Freq: Every day | ORAL | Status: DC
  Administered 2012-06-03 – 2012-06-04 (×2): 40 mg via ORAL
  Filled 2012-06-03 (×3): qty 1

## 2012-06-03 MED ORDER — TEMAZEPAM 15 MG PO CAPS: 15.0000 mg | ORAL_CAPSULE | Freq: Every evening | ORAL | Status: DC | PRN

## 2012-06-03 MED ORDER — DOCUSATE SODIUM 100 MG PO CAPS
100.0000 mg | ORAL_CAPSULE | Freq: Every day | ORAL | Status: DC
  Administered 2012-06-04 – 2012-06-05 (×2): 100 mg via ORAL
  Filled 2012-06-03 (×2): qty 1

## 2012-06-03 MED ORDER — PROMETHAZINE HCL 25 MG/ML IJ SOLN: 6.2500 mg | INTRAMUSCULAR | Status: DC | PRN

## 2012-06-03 SURGICAL SUPPLY — 50 items
BAG ISOLATION DRAPE 18X18 (DRAPES) ×1 IMPLANT
BANDAGE ELASTIC 4 VELCRO ST LF (GAUZE/BANDAGES/DRESSINGS) IMPLANT
BANDAGE ELASTIC 6 VELCRO ST LF (GAUZE/BANDAGES/DRESSINGS) ×2 IMPLANT
BANDAGE ESMARK 6X9 LF (GAUZE/BANDAGES/DRESSINGS) ×1 IMPLANT
BANDAGE GAUZE ELAST BULKY 4 IN (GAUZE/BANDAGES/DRESSINGS) ×2 IMPLANT
BLADE SAW SAG 29X58X.64 (BLADE) ×2 IMPLANT
BNDG COHESIVE 6X5 TAN STRL LF (GAUZE/BANDAGES/DRESSINGS) ×2 IMPLANT
BNDG ESMARK 6X9 LF (GAUZE/BANDAGES/DRESSINGS) ×2
CANISTER SUCTION 2500CC (MISCELLANEOUS) ×2 IMPLANT
CLIP TI MEDIUM 6 (CLIP) ×2 IMPLANT
CLOTH BEACON ORANGE TIMEOUT ST (SAFETY) ×2 IMPLANT
COVER SURGICAL LIGHT HANDLE (MISCELLANEOUS) ×2 IMPLANT
COVER TABLE BACK 60X90 (DRAPES) ×2 IMPLANT
CUFF TOURNIQUET SINGLE 34IN LL (TOURNIQUET CUFF) ×2 IMPLANT
DRAIN CHANNEL 19F RND (DRAIN) IMPLANT
DRAPE ISOLATION BAG 18X18 (DRAPES) ×1
DRAPE ORTHO SPLIT 77X108 STRL (DRAPES) ×2
DRAPE PROXIMA HALF (DRAPES) ×2 IMPLANT
DRAPE SURG ORHT 6 SPLT 77X108 (DRAPES) ×2 IMPLANT
DRAPE U-SHAPE 47X51 STRL (DRAPES) ×2 IMPLANT
DRSG ADAPTIC 3X8 NADH LF (GAUZE/BANDAGES/DRESSINGS) ×2 IMPLANT
ELECT REM PT RETURN 9FT ADLT (ELECTROSURGICAL) ×2
ELECTRODE REM PT RTRN 9FT ADLT (ELECTROSURGICAL) ×1 IMPLANT
EVACUATOR SILICONE 100CC (DRAIN) IMPLANT
GLOVE BIO SURGEON STRL SZ 6.5 (GLOVE) ×4 IMPLANT
GLOVE BIO SURGEON STRL SZ7 (GLOVE) ×2 IMPLANT
GLOVE BIOGEL PI IND STRL 6.5 (GLOVE) ×3 IMPLANT
GLOVE BIOGEL PI IND STRL 7.5 (GLOVE) ×1 IMPLANT
GLOVE BIOGEL PI INDICATOR 6.5 (GLOVE) ×3
GLOVE BIOGEL PI INDICATOR 7.5 (GLOVE) ×1
GLOVE ECLIPSE 6.5 STRL STRAW (GLOVE) ×2 IMPLANT
GOWN STRL NON-REIN LRG LVL3 (GOWN DISPOSABLE) ×6 IMPLANT
KIT BASIN OR (CUSTOM PROCEDURE TRAY) ×2 IMPLANT
KIT ROOM TURNOVER OR (KITS) ×2 IMPLANT
NS IRRIG 1000ML POUR BTL (IV SOLUTION) ×2 IMPLANT
PACK GENERAL/GYN (CUSTOM PROCEDURE TRAY) ×2 IMPLANT
PAD ARMBOARD 7.5X6 YLW CONV (MISCELLANEOUS) ×4 IMPLANT
SPONGE GAUZE 4X4 12PLY (GAUZE/BANDAGES/DRESSINGS) ×2 IMPLANT
STAPLER VISISTAT 35W (STAPLE) ×2 IMPLANT
STOCKINETTE IMPERVIOUS LG (DRAPES) ×2 IMPLANT
SUT ETHILON 3 0 PS 1 (SUTURE) IMPLANT
SUT SILK 0 TIES 10X30 (SUTURE) ×2 IMPLANT
SUT SILK 2 0 (SUTURE) ×1
SUT SILK 2-0 18XBRD TIE 12 (SUTURE) ×1 IMPLANT
SUT VIC AB 2-0 CT1 18 (SUTURE) ×6 IMPLANT
SUT VIC AB 3-0 SH 18 (SUTURE) ×2 IMPLANT
TOWEL OR 17X24 6PK STRL BLUE (TOWEL DISPOSABLE) ×2 IMPLANT
TOWEL OR 17X26 10 PK STRL BLUE (TOWEL DISPOSABLE) ×2 IMPLANT
UNDERPAD 30X30 INCONTINENT (UNDERPADS AND DIAPERS) ×2 IMPLANT
WATER STERILE IRR 1000ML POUR (IV SOLUTION) ×2 IMPLANT

## 2012-06-03 NOTE — Anesthesia Procedure Notes (Signed)
Procedure Name: LMA Insertion Date/Time: 06/03/2012 8:39 AM Performed by: Elon Alas Pre-anesthesia Checklist: Timeout performed, Patient identified, Emergency Drugs available, Suction available and Patient being monitored Patient Re-evaluated:Patient Re-evaluated prior to inductionOxygen Delivery Method: Circle system utilized Preoxygenation: Pre-oxygenation with 100% oxygen Intubation Type: IV induction Ventilation: Mask ventilation without difficulty LMA: LMA inserted LMA Size: 4.0 Number of attempts: 1 Placement Confirmation: breath sounds checked- equal and bilateral and positive ETCO2 Tube secured with: Tape Dental Injury: Teeth and Oropharynx as per pre-operative assessment

## 2012-06-03 NOTE — H&P (Addendum)
[facility] [place]  Brief History and Physical  History of Present Illness  Jaime [name] is a 71 y.o. male who presents with chief complaint: R leg pain.  The patient presents today for R AKA.    This pt has severe debilitating MS and previous has undergone L AKA.  He now has rest pain and tissue loss in right foot.  Past Medical History  Diagnosis Date  . Hyperlipidemia   . Cardiomyopathy, ischemic     EF 45% per ECHO 2008  //   EF 25%, echo, August, 2013  . Pulmonary hypertension     moderate ECHO Jan 2008  . Systolic heart failure   . Multiple sclerosis DX 1993    NEUROLOGIST-  DR DOHMEIER -- LAST VISIT 11-20-2010  NOTE W/ CHART  . Lung nodule     resolved 11-2006 CT Chest  . Tobacco abuse   . Increased prostate specific antigen (PSA) velocity   . Urinary retention     dx ~ 2-12, like from MS, now with a catheter, saw urology  . Ejection fraction      EF 45%, echo, 2008  //   EF 25%, echo, August, 2013  . Cellulitis   . Pre-syncope     Near syncope, ER visit, October, 2011  . Carotid artery disease     Doppler, February, 2012, 0-39% bilateral,Mild smooth plaque  . Chronic indwelling foley catheter   . Scoliosis associated with other condition   . Hemiparesis   . Fatigue SEVERE  . Weakness   . Impotence   . MI, acute, non ST segment elevation 2007    S/P PCI WITH X1 STENT (TAXUS DRUG-ELUTING) LEFT CIRCUMFLEX  . Echocardiogram abnormal 08-17-2006    SEVERE HYPOKINESIS OF INFERIOR , POSTEROR WALLS OF BASE AND MID VENTRICLE AND MOD. PULMONARY HTN  . Diabetes mellitus     INSULIN-DEPENDANT  . Hypertension   . H/O pleural effusion 2008    POST THORACENTESIS  . History of colon polyps PRECANCEROUS  . Insomnia   . Ingrown right big toenail CHRONIC    PER PT VERY SORE  . CAD (coronary artery disease) CARDIOLOGIST- DR KATZ-- VISIT 06-05-2011 IN [facility]    non-STEMI, 2007.Marland Kitchenoccluded circumflex.. Taxus stent placed...residual 80% LAD...50% RCA  .  Pneumonia     hx of  . Urinary tract infection     hx of  . Atherosclerotic PVD with ulceration     left foot  . S/P AKA (above knee amputation)     left AKA August, 2013, after vascular surgery failed  . Preop cardiovascular exam     Cardiac clearance for possible right AKA, September, 2013  . Neuromuscular disorder     multiple sclerosis    Past Surgical History  Procedure Date  . Hernia repair 1990    (R)  . Thoracentesis 2008    PLEURAL EFFUSION  . Cystoscopy 07/30/2011    Procedure: CYSTOSCOPY;  Surgeon: Lindaann Slough, MD;  Location: [facility];  Service: Urology;  Laterality: N/A;  . Transurethral resection of prostate 07/30/2011    Procedure: TRANSURETHRAL RESECTION OF THE PROSTATE (TURP);  Surgeon: Lindaann Slough, MD;  Location: [facility];  Service: Urology;  Laterality: N/A;  . Femoral-tibial bypass graft 03/11/2012    Procedure: BYPASS GRAFT FEMORAL-TIBIAL ARTERY;  Surgeon: Fransisco Hertz, MD;  Location: [facility] OR;  Service: Vascular;  Laterality: Left;  Left Femoral -Tibial trunk bypass, Endarterectomy of Tibial- Peroneal trunk with vein  angioplasty.  . Intraoperative arteriogram 03/11/2012    Procedure: INTRA OPERATIVE ARTERIOGRAM;  Surgeon: Fransisco Hertz, MD;  Location: [facility] OR;  Service: Vascular;  Laterality: Left;  . Femoral-popliteal bypass graft 03/11/2012    Procedure: BYPASS GRAFT FEMORAL-POPLITEAL ARTERY;  Surgeon: Fransisco Hertz, MD;  Location: [facility] OR;  Service: Vascular;  Laterality: Left;  embolectomy left lower leg  . Amputation 03/17/2012    Procedure: AMPUTATION ABOVE KNEE;  Surgeon: Fransisco Hertz, MD;  Location: [facility] OR;  Service: Vascular;  Laterality: Left;  . Coronary angioplasty with stent placement 05-01-2006    OCCLUDED CIRCUMFLEX -- TAXUS STENT PLACMENT  AND RESIDUAL 80% LAD,  50% RCA    History   Social History  . Marital Status: Married    Spouse Name: N/A    Number of Children: 2  . Years of Education: N/A   Occupational  History  . disable    Social History Main Topics  . Smoking status: Former Smoker -- 50 years  . Smokeless tobacco: Never Used     Comment: pt states that he is using E-cigs  . Alcohol Use: No  . Drug Use: No  . Sexually Active: Not on file     Comment: electronic cigarettes   Other Topics Concern  . Not on file   Social History Narrative   Lives w/ wife    Family History  Problem Relation Age of Onset  . Hypertension    . Heart attack Mother 63  . Heart disease Mother   . Stroke Mother   . Hyperlipidemia Mother   . Hypertension Mother   . Colon cancer Neg Hx   . Prostate cancer Neg Hx   . COPD Father   . Peripheral vascular disease Father   . Diabetes Brother   . Heart disease Brother   . Hypertension Brother   . Heart attack Brother   . Diabetes Daughter     No current facility-administered medications on file prior to encounter.   Current Outpatient Prescriptions on File Prior to Encounter  Medication Sig Dispense Refill  . amantadine (SYMMETREL) 100 MG capsule Take 1 capsule (100 mg total) by mouth 2 (two) times daily.  60 capsule  1  . aspirin EC 81 MG tablet Take 81 mg by mouth daily.      . baclofen (LIORESAL) 5 mg TABS Take 0.5 tablets (5 mg total) by mouth 2 (two) times daily.  60 each  1  . diclofenac sodium (VOLTAREN) 1 % GEL Apply 1 application topically 4 (four) times daily as needed. For pain       . gabapentin (NEURONTIN) 300 MG capsule Take 300 mg by mouth 4 (four) times daily.       Marland Kitchen HYDROcodone-acetaminophen (VICODIN) 5-500 MG per tablet Take 1 tablet by mouth every 6 (six) hours as needed for pain.  40 tablet  0  . insulin aspart protamine-insulin aspart (NOVOLOG 70/30) (70-30) 100 UNIT/ML injection Inject 4 Units into the skin daily with breakfast.  10 mL  1  . meloxicam (MOBIC) 15 MG tablet Take 15 mg by mouth daily.       . metoprolol succinate (TOPROL-XL) 12.5 mg TB24 Take 37.5 mg by mouth daily.      . nortriptyline (PAMELOR) 10 MG capsule  Take 1 capsule (10 mg total) by mouth at bedtime.  30 capsule  1  . pantoprazole (PROTONIX) 40 MG tablet Take 1 tablet (40 mg total) by mouth daily at 12 noon.  30  tablet  1  . simvastatin (ZOCOR) 40 MG tablet Take 1 tablet (40 mg total) by mouth at bedtime.  90 tablet  1    Allergies  Allergen Reactions  . Bee Venom Anaphylaxis    Review of Systems: As listed above, otherwise negative.  Physical Examination  Filed Vitals:   06/03/12 0730  BP: 149/106  Pulse: 54  Temp: 97.6 F (36.4 C)  TempSrc: Oral  Resp: 18  SpO2: 98%    General: A&O x 3, weakened appearance  Pulmonary: Sym exp, good air movt, CTAB, no rales, rhonchi, & wheezing  Cardiac: RRR, Nl S1, S2, no Murmurs, rubs or gallops  Gastrointestinal: soft, NTND, -G/R, - HSM, - masses, - CVAT B  Musculoskeletal: L AKA, R foot ischemia with ischemic toes Laboratory See iStat  Medical Decision Making  Jaime [name] is a 71 y.o. male who presents with: severe multiple sclerosis, RLE CLI.   The patient is scheduled for: R AKA  I discussed in depth the nature of right above-the-knee amputation with the patient, including risks, benefits, and alternatives.    The patient is aware that the risks of above-the-knee amputation include but are not limited to: bleeding, infection, myocardial infarction, stroke, death, failure to heal amputation wound, and possible need for more proximal amputation.    The patient is aware of the risks and agrees proceed forward with the procedure.  Leonides Sake, MD [facility] [place] Office: [phone] Pager: [phone]  06/03/2012, 8:02 AM

## 2012-06-03 NOTE — Telephone Encounter (Signed)
Could not leave message, sent letter to notify of 07/03/12 @ 10:45am

## 2012-06-03 NOTE — Progress Notes (Signed)
UR COMPLETED  

## 2012-06-03 NOTE — Telephone Encounter (Signed)
Message copied by Fredrich Birks on Wed Jun 03, 2012 11:19 AM ------      Message from: [place], [place] K      Created: Wed Jun 03, 2012 10:24 AM      Regarding: schedule                   ----- Message -----         From: Dara Lords, PA         Sent: 06/03/2012  10:02 AM           To: Sharee Pimple, CMA            S/p right AKA today.  Will need to f/u with Dr. Imogene Burn in 4 weeks with staple removal.            Thanks,      Lelon Mast

## 2012-06-03 NOTE — H&P (Signed)
[facility] History and Physical  Jaime [name]:[id number] DOB: 12/17/1940 DOA: 06/03/2012  Referring physician: Dr. Imogene Burn PCP: Willow Ora, MD  Specialists: Dr. Imogene Burn  Chief Complaint: Status post AKA  HPI: HERSH VASILOPOULOS is a 71 y.o. male with history of severe debilitating multiple sclerosis and status post left AKA a few months will presented today for right AKA per Dr. Imogene Burn. The patient reported that he had been having severe rest pain in his left foot and so was scheduled for the AKA today. His past medical history includes diabetes mellitus, hypertension, ischemic cardiomyopathy with last ejection fraction 25%, and Following his AKA today the hospitalist service was asked to admit the patient. He denies chest pain, shortness of breath, fevers, dysuria, diarrhea melena and no hematochezia.   Review of Systems: The patient denies anorexia, vision loss, decreased hearing, hoarseness, chest pain, syncope, dyspnea on exertion, peripheral edema, balance deficits, hemoptysis, abdominal pain, melena, hematochezia, severe indigestion/heartburn, hematuria, muscle weakness, suspicious skin lesions, transient blindness,depression, unusual weight change.    Past Medical History  Diagnosis Date  . Hyperlipidemia   . Cardiomyopathy, ischemic     EF 45% per ECHO 2008  //   EF 25%, echo, August, 2013  . Pulmonary hypertension     moderate ECHO Jan 2008  . Systolic heart failure   . Multiple sclerosis DX 1993    NEUROLOGIST-  DR DOHMEIER -- LAST VISIT 11-20-2010  NOTE W/ CHART  . Lung nodule     resolved 11-2006 CT Chest  . Tobacco abuse   . Increased prostate specific antigen (PSA) velocity   . Urinary retention     dx ~ 2-12, like from MS, now with a catheter, saw urology  . Ejection fraction      EF 45%, echo, 2008  //   EF 25%, echo, August, 2013  . Cellulitis   . Pre-syncope     Near syncope, ER visit, October, 2011  . Carotid artery disease     Doppler, February, 2012, 0-39%  bilateral,Mild smooth plaque  . Chronic indwelling foley catheter   . Scoliosis associated with other condition   . Hemiparesis   . Fatigue SEVERE  . Weakness   . Impotence   . MI, acute, non ST segment elevation 2007    S/P PCI WITH X1 STENT (TAXUS DRUG-ELUTING) LEFT CIRCUMFLEX  . Echocardiogram abnormal 08-17-2006    SEVERE HYPOKINESIS OF INFERIOR , POSTEROR WALLS OF BASE AND MID VENTRICLE AND MOD. PULMONARY HTN  . Diabetes mellitus     INSULIN-DEPENDANT  . Hypertension   . H/O pleural effusion 2008    POST THORACENTESIS  . History of colon polyps PRECANCEROUS  . Insomnia   . Ingrown right big toenail CHRONIC    PER PT VERY SORE  . CAD (coronary artery disease) CARDIOLOGIST- DR KATZ-- VISIT 06-05-2011 IN [facility]    non-STEMI, 2007.Marland Kitchenoccluded circumflex.. Taxus stent placed...residual 80% LAD...50% RCA  . Pneumonia     hx of  . Urinary tract infection     hx of  . Atherosclerotic PVD with ulceration     left foot  . S/P AKA (above knee amputation)     left AKA August, 2013, after vascular surgery failed  . Preop cardiovascular exam     Cardiac clearance for possible right AKA, September, 2013  . Neuromuscular disorder     multiple sclerosis   Past Surgical History  Procedure Date  . Hernia repair 1990    (R)  . Thoracentesis 2008  PLEURAL EFFUSION  . Cystoscopy 07/30/2011    Procedure: CYSTOSCOPY;  Surgeon: Lindaann Slough, MD;  Location: [facility];  Service: Urology;  Laterality: N/A;  . Transurethral resection of prostate 07/30/2011    Procedure: TRANSURETHRAL RESECTION OF THE PROSTATE (TURP);  Surgeon: Lindaann Slough, MD;  Location: [facility];  Service: Urology;  Laterality: N/A;  . Femoral-tibial bypass graft 03/11/2012    Procedure: BYPASS GRAFT FEMORAL-TIBIAL ARTERY;  Surgeon: Fransisco Hertz, MD;  Location: [facility] OR;  Service: Vascular;  Laterality: Left;  Left Femoral -Tibial trunk bypass, Endarterectomy of Tibial- Peroneal trunk with vein  angioplasty.  . Intraoperative arteriogram 03/11/2012    Procedure: INTRA OPERATIVE ARTERIOGRAM;  Surgeon: Fransisco Hertz, MD;  Location: [facility] OR;  Service: Vascular;  Laterality: Left;  . Femoral-popliteal bypass graft 03/11/2012    Procedure: BYPASS GRAFT FEMORAL-POPLITEAL ARTERY;  Surgeon: Fransisco Hertz, MD;  Location: [facility] OR;  Service: Vascular;  Laterality: Left;  embolectomy left lower leg  . Amputation 03/17/2012    Procedure: AMPUTATION ABOVE KNEE;  Surgeon: Fransisco Hertz, MD;  Location: [facility] OR;  Service: Vascular;  Laterality: Left;  . Coronary angioplasty with stent placement 05-01-2006    OCCLUDED CIRCUMFLEX -- TAXUS STENT PLACMENT  AND RESIDUAL 80% LAD,  50% RCA   Social History:  reports that he has quit smoking. He has never used smokeless tobacco. He reports that he does not drink alcohol or use illicit drugs.  where does patient live--home, ALF Can patient participate in ADLs?  Allergies  Allergen Reactions  . Bee Venom Anaphylaxis    Family History  Problem Relation Age of Onset  . Hypertension    . Heart attack Mother 52  . Heart disease Mother   . Stroke Mother   . Hyperlipidemia Mother   . Hypertension Mother   . Colon cancer Neg Hx   . Prostate cancer Neg Hx   . COPD Father   . Peripheral vascular disease Father   . Diabetes Brother   . Heart disease Brother   . Hypertension Brother   . Heart attack Brother   . Diabetes Daughter     Prior to Admission medications   Medication Sig Start Date End Date Taking? Authorizing Provider  amantadine (SYMMETREL) 100 MG capsule Take 1 capsule (100 mg total) by mouth 2 (two) times daily. 03/31/12 03/31/13 Yes Daniel J Angiulli, PA  aspirin EC 81 MG tablet Take 81 mg by mouth daily. 02/19/12 02/18/13 Yes Beatrice Lecher, PA  baclofen (LIORESAL) 5 mg TABS Take 0.5 tablets (5 mg total) by mouth 2 (two) times daily. 03/31/12  Yes Daniel J Angiulli, PA  diclofenac sodium (VOLTAREN) 1 % GEL Apply 1 application topically 4 (four) times  daily as needed. For pain  03/09/12  Yes Erick Colace, MD  gabapentin (NEURONTIN) 300 MG capsule Take 300 mg by mouth 4 (four) times daily.  03/31/12  Yes Historical Provider, MD  HYDROcodone-acetaminophen (VICODIN) 5-500 MG per tablet Take 1 tablet by mouth every 6 (six) hours as needed for pain. 05/19/12  Yes Wanda Plump, MD  insulin aspart protamine-insulin aspart (NOVOLOG 70/30) (70-30) 100 UNIT/ML injection Inject 4 Units into the skin daily with breakfast. 03/31/12 03/31/13 Yes Daniel J Angiulli, PA  insulin aspart protamine-insulin aspart (NOVOLOG 70/30) (70-30) 100 UNIT/ML injection Inject 30 Units into the skin daily with supper. Take 30 units at breakfast and 30 units at supper 03/31/12 03/31/13 Yes Mcarthur Rossetti Angiulli, PA  meloxicam (MOBIC) 15  MG tablet Take 15 mg by mouth daily.  03/30/12  Yes Historical Provider, MD  metoprolol succinate (TOPROL-XL) 12.5 mg TB24 Take 37.5 mg by mouth daily. 03/31/12 03/31/13 Yes Daniel J Angiulli, PA  nortriptyline (PAMELOR) 10 MG capsule Take 1 capsule (10 mg total) by mouth at bedtime. 03/31/12 03/31/13 Yes Daniel J Angiulli, PA  pantoprazole (PROTONIX) 40 MG tablet Take 1 tablet (40 mg total) by mouth daily at 12 noon. 03/31/12 03/31/13 Yes Daniel J Angiulli, PA  simvastatin (ZOCOR) 40 MG tablet Take 1 tablet (40 mg total) by mouth at bedtime. 12/05/11  Yes Wanda Plump, MD   Physical Exam: Filed Vitals:   06/03/12 1058 06/03/12 1100 06/03/12 1130 06/03/12 1415  BP:   147/95 132/75  Pulse:  76 77 75  Temp:  97.4 F (36.3 C) 97.6 F (36.4 C) 97.5 F (36.4 C)  TempSrc:   Oral Oral  Resp:  15 16 17   SpO2: 100% 100% 100% 95%    Constitutional: Vital signs reviewed.  Patient is a well-developed and well-nourished in no acute distress and cooperative with exam. Alert and oriented x3.  Head: Normocephalic and atraumatic Mouth: no erythema or exudates, MMM Eyes: PERRL, EOMI, conjunctivae normal, No scleral icterus.  Neck: Supple, Trachea midline normal ROM, No  JVD, mass, thyromegaly, or carotid bruit present.  Cardiovascular: RRR, S1 normal, S2 normal, no MRG, pulses symmetric and intact bilaterally Pulmonary/Chest: CTAB, no wheezes, rales, or rhonchi Abdominal: Soft. Mild lower abdominal tenderness, no rebound tenderness non-distended, bowel sounds are normal, no masses, organomegaly, or guarding present.  Extremities: Bilateral AKA, right with dressing clean and dry, the left is well-healed  Neurological: A&O x3, Strength is normal and symmetric bilaterally, cranial nerve II-XII are grossly intact,  deficit,   Skin: Warm, dry and intact. No rash.  Psychiatric: Normal mood and affect. speech and behavior is normal.   Labs on Admission:  Basic Metabolic Panel:  Lab 06/02/12 1610  NA 140  K 4.0  CL 105  CO2 27  GLUCOSE 216*  BUN 20  CREATININE 0.77  CALCIUM 9.3  MG --  PHOS --   Liver Function Tests: No results found for this basename: AST:5,ALT:5,ALKPHOS:5,BILITOT:5,PROT:5,ALBUMIN:5 in the last 168 hours No results found for this basename: LIPASE:5,AMYLASE:5 in the last 168 hours No results found for this basename: AMMONIA:5 in the last 168 hours CBC:  Lab 06/02/12 1301  WBC 5.5  NEUTROABS --  HGB 14.1  HCT 43.2  MCV 80.0  PLT 146*   Cardiac Enzymes: No results found for this basename: CKTOTAL:5,CKMB:5,CKMBINDEX:5,TROPONINI:5 in the last 168 hours  BNP (last 3 results) No results found for this basename: PROBNP:3 in the last 8760 hours CBG:  Lab 06/03/12 1010 06/03/12 0736  GLUCAP 145* 120*    Radiological Exams on Admission: No results found.    Assessment/Plan Active Problems:  Critical lower limb ischemia-status post right AKA -Per vascular surgery  Multiple sclerosis, severe -Continue outpatient medications  CAD (coronary artery disease) -He is chest pain-free, continue outpatient medications Cardiomyopathy, ischemic/h/o of systolic heart failure -Caution with IV fluids-will decrease given that his last EF  was 25% -Monitor fluid status closely, strict I and os.  Hypertension -Continue metoprolol, when necessary IV antihypertensives as appropriate.  Diabetes mellitus -Monitor Accu-Cheks continue 70/30 follow and adjust dose as appropriate, sliding scale coverage    Code Status: Full Family Communication: Wife at bedside Disposition Plan: Admit to telemetry bed  Time spent: >43mins  Kela Millin [facility] Pager [phone]  If 7PM-7AM, please contact night-coverage [email or web address] Password [facility] 06/03/2012, 3:19 PM

## 2012-06-03 NOTE — Op Note (Signed)
OPERATIVE NOTE   PROCEDURE: Right above-the-knee amputation  PRE-OPERATIVE DIAGNOSIS: right leg gangrene  POST-OPERATIVE DIAGNOSIS: same as above  SURGEON: Leonides Sake, MD  ASSISTANT(S): Doreatha Massed, PAC   ANESTHESIA: general  ESTIMATED BLOOD LOSS: 50 cc  FINDING(S): 1.  Viable tissue throughout right thigh   SPECIMEN(S):  right above-the-knee amputation  INDICATIONS:   Jaime [name] is a 71 y.o. male who presents with right leg rest pain and ischemic toes.  The patient is scheduled for a right above-the-knee amputation.  I discussed in depth with the patient the risks, benefits, and alternatives to this procedure.  The patient is aware that the risk of this operation included but are not limited to:  bleeding, infection, myocardial infarction, stroke, death, failure to heal amputation wound, and possible need for more proximal amputation.  The patient is aware of the risks and agrees proceed forward with the procedure.  DESCRIPTION: After full informed written consent was obtained from the patient, the patient was brought back to the operating room, and placed supine upon the operating table.  Prior to induction, the patient received IV antibiotics.  The patient was then prepped and draped in the standard fashion for an above-the-knee amputation.  I placed a non-sterile tourniquet on the thigh prior to the procedure.  After obtaining adequate anesthesia, the patient was prepped and draped in the standard fashion for a above-the-knee amputation.  I marked out the anterior and posterior flaps for a fish-mouth type of amputation.  I exsanguinated the leg with an Esmarch bandage and then inflated the tourniquet to 250 mm Hg.   I made the incisions for these flaps, and then dissected through the subcutaneous tissue, fascia, and muscles circumferentially.  I elevated  the periosteal tissue 4 cm more proximal than the anterior skin flap.  I then transected the femur with a power saw at this  level.  Then I smoothed out the rough edges of the bone with a rasp.  At this point, the specimen was passed off the field as the above-the-knee amputation.  At this point, I clamped all visibly bleeding arteries and veins using a combination of suture ligation with Vicryl suture and electrocautery.  The tourniquet was then deflated at this point.  Bleeding continued to be controlled with electrocautery and suture ligature.  The stump was washed off with sterile normal saline and no further active bleeding was noted.  I reapproximated the anterior and posterior fascia  with interrupted stitches of 2-0 Vicryl.  This was completed along the entire length of anterior and posterior fascia until there were no more loose space in the fascial line.  The skin was then  reapproximated with staples.  The stump was washed off and dried.  The incision was dressed with [employer] and  then fluffs were applied.  Kerlix was wrapped around the leg and then gently an ACE wrap was applied.    COMPLICATIONS: none  CONDITION: stable   Leonides Sake, MD [facility] Office: [phone] Pager: [phone]  06/03/2012, 9:58 AM

## 2012-06-03 NOTE — Anesthesia Postprocedure Evaluation (Signed)
  Anesthesia Post-op Note  Patient: Jaime [name]  Procedure(s) Performed: Procedure(s) (LRB) with comments: AMPUTATION ABOVE KNEE (Right)  Patient Location: PACU  Anesthesia Type:General  Level of Consciousness: awake and alert   Airway and Oxygen Therapy: Patient Spontanous Breathing  Post-op Pain: mild  Post-op Assessment: Post-op Vital signs reviewed, Patient's Cardiovascular Status Stable, Respiratory Function Stable, Patent Airway, No signs of Nausea or vomiting and Pain level controlled  Post-op Vital Signs: Reviewed  Complications: No apparent anesthesia complications

## 2012-06-03 NOTE — Transfer of Care (Signed)
Immediate Anesthesia Transfer of Care Note  Patient: Jaime [name]  Procedure(s) Performed: Procedure(s) (LRB) with comments: AMPUTATION ABOVE KNEE (Right)  Patient Location: PACU  Anesthesia Type:General  Level of Consciousness: awake and alert   Airway & Oxygen Therapy: Patient Spontanous Breathing and Patient connected to nasal cannula oxygen  Post-op Assessment: Report given to PACU RN, Post -op Vital signs reviewed and stable and Patient moving all extremities X 4  Post vital signs: Reviewed and stable  Complications: No apparent anesthesia complications

## 2012-06-03 NOTE — Anesthesia Preprocedure Evaluation (Addendum)
Anesthesia Evaluation  Patient identified by MRN, date of birth, ID band Patient awake    Reviewed: Allergy & Precautions, H&P , NPO status , Patient's Chart, lab work & pertinent test results  Airway Mallampati: II TM Distance: >3 FB Neck ROM: Full    Dental   Pulmonary pneumonia -, resolved,  breath sounds clear to auscultation        Cardiovascular hypertension, Pt. on home beta blockers + CAD, + Past MI and +CHF Rhythm:Regular Rate:Normal     Neuro/Psych Multiple sclerosis    GI/Hepatic negative GI ROS, Neg liver ROS,   Endo/Other  diabetes  Renal/GU negative Renal ROS     Musculoskeletal   Abdominal   Peds  Hematology negative hematology ROS (+)   Anesthesia Other Findings   Reproductive/Obstetrics                         Anesthesia Physical Anesthesia Plan  ASA: III  Anesthesia Plan: General   Post-op Pain Management:    Induction: Intravenous  Airway Management Planned: Oral ETT  Additional Equipment:   Intra-op Plan:   Post-operative Plan: Extubation in OR  Informed Consent: I have reviewed the patients History and Physical, chart, labs and discussed the procedure including the risks, benefits and alternatives for the proposed anesthesia with the patient or authorized representative who has indicated his/her understanding and acceptance.     Plan Discussed with: CRNA and Surgeon  Anesthesia Plan Comments:         Anesthesia Quick Evaluation

## 2012-06-03 NOTE — Preoperative (Signed)
Beta Blockers   Reason not to administer Beta Blockers:Not Applicable 

## 2012-06-04 ENCOUNTER — Encounter (HOSPITAL_COMMUNITY): Payer: Self-pay | Admitting: Vascular Surgery

## 2012-06-04 DIAGNOSIS — I739 Peripheral vascular disease, unspecified: Secondary | ICD-10-CM

## 2012-06-04 DIAGNOSIS — L98499 Non-pressure chronic ulcer of skin of other sites with unspecified severity: Secondary | ICD-10-CM

## 2012-06-04 DIAGNOSIS — S78119A Complete traumatic amputation at level between unspecified hip and knee, initial encounter: Secondary | ICD-10-CM

## 2012-06-04 DIAGNOSIS — I509 Heart failure, unspecified: Secondary | ICD-10-CM

## 2012-06-04 LAB — GLUCOSE, CAPILLARY
Glucose-Capillary: 152 mg/dL — ABNORMAL HIGH (ref 70–99)
Glucose-Capillary: 145 mg/dL — ABNORMAL HIGH (ref 70–99)
Glucose-Capillary: 114 mg/dL — ABNORMAL HIGH (ref 70–99)
Glucose-Capillary: 111 mg/dL — ABNORMAL HIGH (ref 70–99)

## 2012-06-04 LAB — CBC
HCT: 40.3 % (ref 39.0–52.0)
Hemoglobin: 13.2 g/dL (ref 13.0–17.0)
MCH: 26.3 pg (ref 26.0–34.0)
MCHC: 32.8 g/dL (ref 30.0–36.0)
RBC: 5.02 MIL/uL (ref 4.22–5.81)

## 2012-06-04 LAB — BASIC METABOLIC PANEL
BUN: 12 mg/dL (ref 6–23)
CO2: 25 mEq/L (ref 19–32)
Chloride: 103 mEq/L (ref 96–112)
Glucose, Bld: 151 mg/dL — ABNORMAL HIGH (ref 70–99)
Potassium: 4 mEq/L (ref 3.5–5.1)

## 2012-06-04 LAB — URINALYSIS, ROUTINE W REFLEX MICROSCOPIC
Nitrite: NEGATIVE
Specific Gravity, Urine: 1.019 (ref 1.005–1.030)
Urobilinogen, UA: 1 mg/dL (ref 0.0–1.0)

## 2012-06-04 LAB — HEMOGLOBIN A1C: Hgb A1c MFr Bld: 7.1 % — ABNORMAL HIGH (ref <5.7)

## 2012-06-04 MED ORDER — GLUCOSE 40 % PO GEL
ORAL | Status: AC
  Administered 2012-06-04: 37.5 g
  Filled 2012-06-04: qty 1

## 2012-06-04 NOTE — Progress Notes (Signed)
Orthopedic Tech Progress Note Patient Details:  Jaime [name] 07-12-1941 [id number] OHF with trapeze bar applied to patient's bed Patient ID: Jaime [name], male   DOB: [birth year], 71 y.o.   MRN: [id number]   Jaime [name] 06/04/2012, 11:51 AM

## 2012-06-04 NOTE — Progress Notes (Signed)
Rehab admissions - Evaluated for possible admission.  Please see rehab consult done today by Dr. Riley Kill recommending [facility].  Not appropriate for an acute inpatient rehab admission.  Call me for questions.  #[phone]

## 2012-06-04 NOTE — Consult Note (Signed)
Physical Medicine and Rehabilitation Consult Reason for Consult: Right AKA Referring Physician: Dr. Imogene Burn   HPI: Jaime [name] is a 71 y.o. 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 Patient also with history of ischemic cardiomyopathy with ejection fraction 25%, diabetes mellitus and peripheral neuropathy. Patient lives at home with his wife and family and has been essentially wheelchair-bound. Admitted 06/03/2012 with gangrenous changes of right lower extremity and history of bypass grafting to the right lower extremity. Patient with progressive ischemic changes a right lower extremity and limb was not felt to be salvageable. Underwent right above-knee amputation 06/03/2012 per Dr. Imogene Burn. Postoperative pain management. Subcutaneous Lovenox added for DVT prophylaxis. Patient's hemoglobin A1c of 7.1 with insulin therapy as directed. Physical and occupational therapy evaluations are pending. M.D. is requested physical medicine rehabilitation consult to consider inpatient rehabilitation services   Review of Systems  Cardiovascular: Positive for leg swelling.  Neurological: Positive for tingling and weakness.       Muscle spasms  Psychiatric/Behavioral: The patient has insomnia.   All other systems reviewed and are negative.   Past Medical History  Diagnosis Date  . Hyperlipidemia   . Cardiomyopathy, ischemic     EF 45% per ECHO 2008  //   EF 25%, echo, August, 2013  . Pulmonary hypertension     moderate ECHO Jan 2008  . Systolic heart failure   . Multiple sclerosis DX 1993    NEUROLOGIST-  DR DOHMEIER -- LAST VISIT 11-20-2010  NOTE W/ CHART  . Lung nodule     resolved 11-2006 CT Chest  . Tobacco abuse   . Increased prostate specific antigen (PSA) velocity   . Urinary retention     dx ~ 2-12, like from MS, now with a catheter, saw urology  . Ejection fraction      EF 45%,  echo, 2008  //   EF 25%, echo, August, 2013  . Cellulitis   . Pre-syncope     Near syncope, ER visit, October, 2011  . Carotid artery disease     Doppler, February, 2012, 0-39% bilateral,Mild smooth plaque  . Chronic indwelling foley catheter   . Scoliosis associated with other condition   . Hemiparesis   . Fatigue SEVERE  . Weakness   . Impotence   . MI, acute, non ST segment elevation 2007    S/P PCI WITH X1 STENT (TAXUS DRUG-ELUTING) LEFT CIRCUMFLEX  . Echocardiogram abnormal 08-17-2006    SEVERE HYPOKINESIS OF INFERIOR , POSTEROR WALLS OF BASE AND MID VENTRICLE AND MOD. PULMONARY HTN  . Diabetes mellitus     INSULIN-DEPENDANT  . Hypertension   . H/O pleural effusion 2008    POST THORACENTESIS  . History of colon polyps PRECANCEROUS  . Insomnia   . Ingrown right big toenail CHRONIC    PER PT VERY SORE  . CAD (coronary artery disease) CARDIOLOGIST- DR KATZ-- VISIT 06-05-2011 IN [facility]    non-STEMI, 2007.Marland Kitchenoccluded circumflex.. Taxus stent placed...residual 80% LAD...50% RCA  . Pneumonia     hx of  . Urinary tract infection     hx of  . Atherosclerotic PVD with ulceration     left foot  . S/P AKA (above knee amputation)     left AKA August, 2013, after vascular surgery failed  . Preop cardiovascular exam     Cardiac clearance for possible right AKA, September, 2013  . Neuromuscular disorder     multiple sclerosis  Past Surgical History  Procedure Date  . Hernia repair 1990    (R)  . Thoracentesis 2008    PLEURAL EFFUSION  . Cystoscopy 07/30/2011    Procedure: CYSTOSCOPY;  Surgeon: Lindaann Slough, MD;  Location: [facility];  Service: Urology;  Laterality: N/A;  . Transurethral resection of prostate 07/30/2011    Procedure: TRANSURETHRAL RESECTION OF THE PROSTATE (TURP);  Surgeon: Lindaann Slough, MD;  Location[facility];  Service: Urology;  Laterality: N/A;  . Femoral-tibial bypass graft 03/11/2012    Procedure: BYPASS GRAFT  FEMORAL-TIBIAL ARTERY;  Surgeon: Fransisco Hertz, MD;  Location[facility] OR;  Service: Vascular;  Laterality: Left;  Left Femoral -Tibial trunk bypass, Endarterectomy of Tibial- Peroneal trunk with vein angioplasty.  . Intraoperative arteriogram 03/11/2012    Procedure: INTRA OPERATIVE ARTERIOGRAM;  Surgeon: Fransisco Hertz, MD;  Location: [facility] OR;  Service: Vascular;  Laterality: Left;  . Femoral-popliteal bypass graft 03/11/2012    Procedure: BYPASS GRAFT FEMORAL-POPLITEAL ARTERY;  Surgeon: Fransisco Hertz, MD;  Location: [facility] OR;  Service: Vascular;  Laterality: Left;  embolectomy left lower leg  . Amputation 03/17/2012    Procedure: AMPUTATION ABOVE KNEE;  Surgeon: Fransisco Hertz, MD;  Location: [facility] OR;  Service: Vascular;  Laterality: Left;  . Coronary angioplasty with stent placement 05-01-2006    OCCLUDED CIRCUMFLEX -- TAXUS STENT PLACMENT  AND RESIDUAL 80% LAD,  50% RCA   Family History  Problem Relation Age of Onset  . Hypertension    . Heart attack Mother 39  . Heart disease Mother   . Stroke Mother   . Hyperlipidemia Mother   . Hypertension Mother   . Colon cancer Neg Hx   . Prostate cancer Neg Hx   . COPD Father   . Peripheral vascular disease Father   . Diabetes Brother   . Heart disease Brother   . Hypertension Brother   . Heart attack Brother   . Diabetes Daughter    Social History:  reports that he has quit smoking. He has never used smokeless tobacco. He reports that he does not drink alcohol or use illicit drugs. Allergies:  Allergies  Allergen Reactions  . Bee Venom Anaphylaxis   Medications Prior to Admission  Medication Sig Dispense Refill  . amantadine (SYMMETREL) 100 MG capsule Take 1 capsule (100 mg total) by mouth 2 (two) times daily.  60 capsule  1  . aspirin EC 81 MG tablet Take 81 mg by mouth daily.      . baclofen (LIORESAL) 5 mg TABS Take 0.5 tablets (5 mg total) by mouth 2 (two) times daily.  60 each  1  . diclofenac sodium (VOLTAREN) 1 % GEL Apply 1 application  topically 4 (four) times daily as needed. For pain       . gabapentin (NEURONTIN) 300 MG capsule Take 300 mg by mouth 4 (four) times daily.       Marland Kitchen HYDROcodone-acetaminophen (VICODIN) 5-500 MG per tablet Take 1 tablet by mouth every 6 (six) hours as needed for pain.  40 tablet  0  . insulin aspart protamine-insulin aspart (NOVOLOG 70/30) (70-30) 100 UNIT/ML injection Inject 4 Units into the skin daily with breakfast.  10 mL  1  . insulin aspart protamine-insulin aspart (NOVOLOG 70/30) (70-30) 100 UNIT/ML injection Inject 30 Units into the skin daily with supper. Take 30 units at breakfast and 30 units at supper      . meloxicam (MOBIC) 15 MG tablet Take 15 mg by  mouth daily.       . metoprolol succinate (TOPROL-XL) 12.5 mg TB24 Take 37.5 mg by mouth daily.      . nortriptyline (PAMELOR) 10 MG capsule Take 1 capsule (10 mg total) by mouth at bedtime.  30 capsule  1  . pantoprazole (PROTONIX) 40 MG tablet Take 1 tablet (40 mg total) by mouth daily at 12 noon.  30 tablet  1  . simvastatin (ZOCOR) 40 MG tablet Take 1 tablet (40 mg total) by mouth at bedtime.  90 tablet  1    Home:    Functional History:   Functional Status:  Mobility:          ADL:    Cognition: Cognition Orientation Level: Oriented to person;Oriented to place;Oriented to situation    Blood pressure 124/71, pulse 85, temperature 99.6 F (37.6 C), temperature source Oral, resp. rate 18, SpO2 97.00%. Physical Exam  Vitals reviewed. Constitutional: He is oriented to person, place, and time.  HENT:  Head: Normocephalic.  Eyes:       Pupils round and reactive to light  Neck: No thyromegaly present.  Cardiovascular: Normal rate and regular rhythm.   Pulmonary/Chest: Effort normal and breath sounds normal. He has no wheezes.  Abdominal: Soft. Bowel sounds are normal. He exhibits no distension.  Musculoskeletal:       Right leg dressed, appropriately tender.   Neurological: He is alert and oriented to person, place,  and time.       Speech is mildly slurred but fully intelligible. He follows simple commands. UES near 4-5/5. Left leg 3-4/5 at hip. Right leg can't be tested due to pain. Fluctuating tone in the ue's. Reasonable insight and awareness.  Skin:       Left above-knee amputation is well-healed right above-knee amputation site is dressed  Psychiatric: He has a normal mood and affect. His behavior is normal. Judgment and thought content normal.    Results for orders placed during the hospital encounter of 06/03/12 (from the past 24 hour(s))  GLUCOSE, CAPILLARY     Status: Abnormal   Collection Time   06/03/12  7:36 AM      Component Value Range   Glucose-Capillary 120 (*) 70 - 99 mg/dL  GLUCOSE, CAPILLARY     Status: Abnormal   Collection Time   06/03/12 10:10 AM      Component Value Range   Glucose-Capillary 145 (*) 70 - 99 mg/dL  HEMOGLOBIN Z6X     Status: Abnormal   Collection Time   06/03/12 12:19 PM      Component Value Range   Hemoglobin A1C 7.1 (*) <5.7 %   Mean Plasma Glucose 157 (*) <117 mg/dL  CBC     Status: Abnormal   Collection Time   06/03/12  3:47 PM      Component Value Range   WBC 9.2  4.0 - 10.5 K/uL   RBC 5.20  4.22 - 5.81 MIL/uL   Hemoglobin 13.9  13.0 - 17.0 g/dL   HCT 09.6  04.5 - 40.9 %   MCV 80.2  78.0 - 100.0 fL   MCH 26.7  26.0 - 34.0 pg   MCHC 33.3  30.0 - 36.0 g/dL   RDW 81.1 (*) 91.4 - 78.2 %   Platelets 133 (*) 150 - 400 K/uL  CREATININE, SERUM     Status: Normal   Collection Time   06/03/12  3:47 PM      Component Value Range   Creatinine, Ser 0.61  0.50 - 1.35 mg/dL   GFR calc non Af Amer >90  >90 mL/min   GFR calc Af Amer >90  >90 mL/min  GLUCOSE, CAPILLARY     Status: Abnormal   Collection Time   06/03/12  5:01 PM      Component Value Range   Glucose-Capillary 159 (*) 70 - 99 mg/dL   Comment 1 Documented in Chart     Comment 2 Notify RN    GLUCOSE, CAPILLARY     Status: Abnormal   Collection Time   06/03/12  8:00 PM      Component Value  Range   Glucose-Capillary 177 (*) 70 - 99 mg/dL  GLUCOSE, CAPILLARY     Status: Abnormal   Collection Time   06/04/12  4:42 AM      Component Value Range   Glucose-Capillary 152 (*) 70 - 99 mg/dL   No results found.  Assessment/Plan: Diagnosis: Right AKA with previous left AKA and hx of MS. 1. Does the need for close, 24 hr/day medical supervision in concert with the patient's rehab needs make it unreasonable for this patient to be served in a less intensive setting? No 2. Co-Morbidities requiring supervision/potential complications: see above 3. Due to bladder management, bowel management and safety, does the patient require 24 hr/day rehab nursing? No 4. Does the patient require coordinated care of a physician, rehab nurse, PT,OT to address physical and functional deficits in the context of the above medical diagnosis(es)? No Addressing deficits in the following areas: balance, endurance, locomotion, strength, transferring, bowel/bladder control, bathing, dressing, feeding, grooming and toileting 5. Can the patient actively participate in an intensive therapy program of at least 3 hrs of therapy per day at least 5 days per week? No 6. The potential for patient to make measurable gains while on inpatient rehab is fair 7. Anticipated functional outcomes upon discharge from inpatient rehab are n/a with PT, n/a with OT, n/a with SLP. 8. Estimated rehab length of stay to reach the above functional goals is: n/a 9. Does the patient have adequate social supports to accommodate these discharge functional goals? Potentially 10. Anticipated D/C setting: Home 11. Anticipated post D/C treatments: HH therapy 12. Overall Rehab/Functional Prognosis: good  RECOMMENDATIONS: This patient's condition is appropriate for continued rehabilitative care in the following setting: [facility] Patient has agreed to participate in recommended program. Yes Note that insurance prior authorization may be required for  reimbursement for recommended care.  Comment: I spoke at length the patient and his wife. He was requiring total care PTA, including, hoyer lift, hospital bed, toileting in bed/diaper, etc. Would not benefit from CIR as his level of care would not change.  Ivory Broad, MD    06/04/2012

## 2012-06-04 NOTE — Clinical Documentation Improvement (Signed)
DIABETIC  DOCUMENTATION CLARIFICATION QUERY  THIS DOCUMENT IS NOT A PERMANENT PART OF THE MEDICAL RECORD  TO RESPOND TO THE THIS QUERY, FOLLOW THE INSTRUCTIONS BELOW:  1. If needed, update documentation for the patient's encounter via the notes activity.  2. Access this query again and click edit on the In [employer].  3. After updating, or not, click F2 to complete all highlighted (required) fields concerning your review. Select "additional documentation in the medical record" OR "no additional documentation provided".  4. Click Sign note button.  5. The deficiency will fall out of your In Basket *Please let us know if you are not able to complete this workflow by phone or e-mail (listed below).  Please update your documentation within the medical record to reflect your response to this query.                                                                                        06/04/12   Dear Dr.Kiyo Heal/Associates,  In a better effort to capture your patient's severity of illness, reflect appropriate length of stay and utilization of resources, a review of the patient medical record has revealed the following indicators.    Based on your clinical judgment, please clarify and document in a progress note and/or discharge summary the clinical condition associated with the following supporting information:  In responding to this query please exercise your independent judgment.  The fact that a query is asked, does not imply that any particular answer is desired or expected.  Please clarify and specify Diabetes type, control, manifestations, and associated conditions.   Possible Clinical Conditions:  _______Diabetes Type  1 or 2 _______Controlled or uncontrolled  Manifestations:  _______DM retinopathy  _______DM PVD _______DM neuropathy   _______DM nephropathy  _______Other Condition  _______Cannot Clinically determine     Risk Factors: Diabetes Mellitus noted per 11/14  progress notes.  Diagnostics: 11/13: Hgb A1c: 7.1;    Mean plasma glucose: 157;   Glucose, capillary: 159. 11/12: glucose, blood: 216  Treatment: 11/13:  novolog insulin 3x daily with meals. 11/13:  novolog 70/30, 30u sq qday with supper. 11/14:  novolog 70/30, 4u sq qday with breakfast.  You may use possible, probable, or suspect with inpatient documentation. possible, probable, suspected diagnoses MUST be documented at the time of discharge  Reviewed: additional documentation in the medical record  Thank You,  Marciano Sequin,  Clinical Documentation Specialist:  Pager: [phone]  Phone: [phone]  Health Information Management [place]

## 2012-06-04 NOTE — Progress Notes (Addendum)
[facility] PROGRESS NOTE  Jaime [name] NWG:[id number] DOB: [birth year] DOA: 06/03/2012 PCP: Willow Ora, MD  Assessment/Plan: Critical lower limb ischemia-status post right AKA  -Per vascular surgery  -CIR and declined patient and recommending home health -Discussed surgery to advise when okay for DC. Multiple sclerosis, severe  -Continue outpatient medications  CAD (coronary artery disease)  -He is chest pain-free, continue outpatient medications  Cardiomyopathy, ischemic/h/o of systolic heart failure  -Caution with IV fluids, DC'd today 11/14- his last EF was 25%  -Monitor fluid status closely, strict I and os.  Hypertension  -Continue metoprolol, when necessary IV antihypertensives as appropriate.  Diabetes mellitus type II, uncontrolled  -continue 70/30 and sliding scale coverage follow and adjust dose as appropriate Suprapubic tenderness  -obtain UA and culture as previously ordered, and follow.     Code Status: Full code Family Communication: wife, at bedside Disposition Plan: Home health   Consultants:  Vascular surgery-Dr. Imogene Burn  Procedures:  Status post right BKA  Antibiotics:  CeFUROXIME postoperatively x2 doses-started on 11/13  HPI/Subjective: Working with physical therapy this a.m., denies any new complaints. No chest pain no shortness of breath.  Objective: Filed Vitals:   06/03/12 1415 06/03/12 1932 06/04/12 0339 06/04/12 1025  BP: 132/75 145/89 124/71 114/64  Pulse: 75 85 85 70  Temp: 97.5 F (36.4 C) 99 F (37.2 C) 99.6 F (37.6 C)   TempSrc: Oral Oral Oral   Resp: 17 18 18    SpO2: 95% 98% 97%     Intake/Output Summary (Last 24 hours) at 06/04/12 1058 Last data filed at 06/03/12 2145  Gross per 24 hour  Intake    363 ml  Output      0 ml  Net    363 ml   There were no vitals filed for this visit.  Exam:   General:  Elderly black male, in no apparent distress  Cardiovascular: Regular rate and rhythm, normal  S1-S2  Respiratory: clear to auscultation bilaterally, no crackles or wheezes  Abdomen: Soft, bowel sounds present, mild suprapubic tenderness nondistended no organomegaly and no masses palpable  Extremities: Bilateral AKA, right with dressing clean and dry  Data Reviewed: Basic Metabolic Panel:  Lab 06/04/12 5784 06/03/12 1547 06/02/12 1301  NA 137 -- 140  K 4.0 -- 4.0  CL 103 -- 105  CO2 25 -- 27  GLUCOSE 151* -- 216*  BUN 12 -- 20  CREATININE 0.62 0.61 0.77  CALCIUM 8.7 -- 9.3  MG -- -- --  PHOS -- -- --   Liver Function Tests: No results found for this basename: AST:5,ALT:5,ALKPHOS:5,BILITOT:5,PROT:5,ALBUMIN:5 in the last 168 hours No results found for this basename: LIPASE:5,AMYLASE:5 in the last 168 hours No results found for this basename: AMMONIA:5 in the last 168 hours CBC:  Lab 06/04/12 0530 06/03/12 1547 06/02/12 1301  WBC 6.6 9.2 5.5  NEUTROABS -- -- --  HGB 13.2 13.9 14.1  HCT 40.3 41.7 43.2  MCV 80.3 80.2 80.0  PLT 143* 133* 146*   Cardiac Enzymes: No results found for this basename: CKTOTAL:5,CKMB:5,CKMBINDEX:5,TROPONINI:5 in the last 168 hours BNP (last 3 results) No results found for this basename: PROBNP:3 in the last 8760 hours CBG:  Lab 06/04/12 0651 06/04/12 0442 06/03/12 2000 06/03/12 1701 06/03/12 1010  GLUCAP 139* 152* 177* 159* 145*    Recent Results (from the past 240 hour(s))  SURGICAL PCR SCREEN     Status: Normal   Collection Time   06/02/12  1:00 PM  Component Value Range Status Comment   MRSA, PCR NEGATIVE  NEGATIVE Final    Staphylococcus aureus NEGATIVE  NEGATIVE Final      Studies: No results found.  Scheduled Meds:   . amantadine  100 mg Oral BID  . aspirin EC  81 mg Oral Daily  . baclofen  5 mg Oral BID  . [COMPLETED] cefUROXime (ZINACEF)  IV  1.5 g Intravenous Q12H  . docusate sodium  100 mg Oral Daily  . enoxaparin (LOVENOX) injection  40 mg Subcutaneous Q24H  . gabapentin  300 mg Oral QID  . [EXPIRED]  HYDROmorphone      . insulin aspart  0-9 Units Subcutaneous TID WC  . insulin aspart protamine-insulin aspart  30 Units Subcutaneous Q supper  . insulin aspart protamine-insulin aspart  4 Units Subcutaneous Q breakfast  . meloxicam  15 mg Oral Daily  . metoprolol succinate  37.5 mg Oral Daily  . nortriptyline  10 mg Oral QHS  . [EXPIRED] oxyCODONE      . pantoprazole  40 mg Oral Daily  . simvastatin  40 mg Oral QHS  . sodium chloride  3 mL Intravenous Q12H  . [DISCONTINUED] fentaNYL  50-100 mcg Intravenous Once   Continuous Infusions:   . sodium chloride 10 mL/hr at 06/04/12 0827  . [DISCONTINUED] sodium chloride      Active Problems:  Multiple sclerosis  CAD (coronary artery disease)  Critical lower limb ischemia  Cardiomyopathy, ischemic  Hypertension  Diabetes mellitus    Time spent:    Cambree Hendrix C  [facility] Pager [phone] If 8PM-8AM, please contact night-coverage at [email or web address], password [facility] 06/04/2012, 10:58 AM  LOS: 1 day

## 2012-06-04 NOTE — Progress Notes (Addendum)
06/04/2012 7:17 AM POD 1  Subjective:  No complaints  Tm 99.6 100-140's systolic HR 50's-80's regular Filed Vitals:   06/04/12 0339  BP: 124/71  Pulse: 85  Temp: 99.6 F (37.6 C)  Resp: 18    Physical Exam: Incisions:  Bandage is in tact and clean   CBC    Component Value Date/Time   WBC 6.6 06/04/2012 0530   RBC 5.02 06/04/2012 0530   HGB 13.2 06/04/2012 0530   HCT 40.3 06/04/2012 0530   PLT 143* 06/04/2012 0530   MCV 80.3 06/04/2012 0530   MCH 26.3 06/04/2012 0530   MCHC 32.8 06/04/2012 0530   RDW 17.8* 06/04/2012 0530   LYMPHSABS 2.0 05/05/2012 0917   MONOABS 0.4 05/05/2012 0917   EOSABS 0.3 05/05/2012 0917   BASOSABS 0.0 05/05/2012 0917    BMET    Component Value Date/Time   NA 137 06/04/2012 0530   K 4.0 06/04/2012 0530   CL 103 06/04/2012 0530   CO2 25 06/04/2012 0530   GLUCOSE 151* 06/04/2012 0530   GLUCOSE 110* 05/12/2006 1139   BUN 12 06/04/2012 0530   CREATININE 0.62 06/04/2012 0530   CALCIUM 8.7 06/04/2012 0530   GFRNONAA >90 06/04/2012 0530   GFRAA >90 06/04/2012 0530    INR    Component Value Date/Time   INR 0.98 06/02/2012 1301     Intake/Output Summary (Last 24 hours) at 06/04/12 0717 Last data filed at 06/03/12 2145  Gross per 24 hour  Intake   1463 ml  Output     50 ml  Net   1413 ml     Assessment/Plan:  71 y.o. male is s/p right above knee amputation  POD 1  -doing well-will remove dressing tomorrow -continue to mobilize -d/c IVF when taking po's  Doreatha Massed, PA-C [facility] [phone] 06/04/2012 7:17 AM  Addendum  I have independently interviewed and examined the patient, and I agree with the physician assistant's findings.  No blood on bandages.  Pain well controlled.  Wound check tomorrow.  Leonides Sake, MD [facility] [place] Office: [phone] Pager: [phone]  06/04/2012, 2:34 PM

## 2012-06-04 NOTE — Progress Notes (Signed)
Hypoglycemic Event  CBG: 39  Treatment: 1 tube instant glucose  Symptoms: None  Follow-up CBG: Time:2230 CBG Result:51  Possible Reasons for Event: Unknown  Comments/MD notified:Pt only came up to 51, will readdress    Jaime [name]  Remember to initiate Hypoglycemia Order Set & complete

## 2012-06-04 NOTE — Evaluation (Signed)
Physical Therapy Evaluation Patient Details Name: Jaime [name] MRN: [id number] DOB: July 30, 1940 Today's Date: 06/04/2012 Time: 5621-3086 PT Time Calculation (min): 21 min  PT Assessment / Plan / Recommendation Clinical Impression  Pt s/p new R AKA with PMH L AKA, MS, and DM.  Pt presents with weakness in LEs and UEs, with surgical pain on right residual limb with movement.  Pt's wife is main caregiver and assists with all ADLs.  Pt and Pt's wife hope to work on bed mobility and transfers.  Recommend HHPT f/u and plan to check WC to determine if pressure relieving cushion is needed.     PT Assessment  Patient needs continued PT services    Follow Up Recommendations  HHPT f/u    Does the patient have the potential to tolerate intense rehabilitation      Barriers to Discharge   none    Equipment Recommendations  Needs pressure relieving WC cushion, electric hoyer lift per wife's request - please contact [facility] and see if they can provide this in exchange for manual hoyer lift.   Recommendations for Other Services     Frequency Min 3X/week    Precautions / Restrictions Restrictions Weight Bearing Restrictions: No   Pertinent Vitals/Pain no apparent distress Denies pain at rest; expresses pain with movement      Mobility  Bed Mobility Bed Mobility: Rolling Right;Rolling Left;Supine to Sit Rolling Right: 1: +2 Total assist Rolling Right: Patient Percentage: 20% Rolling Left: 1: +2 Total assist Rolling Left: Patient Percentage: 20% Supine to Sit: 1: +2 Total assist Supine to Sit: Patient Percentage: 40% Details for Bed Mobility Assistance: pt able to rotate at trunk and use rails to pulls self to right or left but does not have strength to rotate hips; req assistance to achieve full right or left rolling. Once in long sitting pt had significant difficulty maintaining sitting position secondary to weakness and severe scoliosis and needing +2 total assist to long  sit (pt =20%).  Transfers Transfers: Not assessed Ambulation/Gait Ambulation/Gait Assistance: Not tested (comment) Stairs: No Wheelchair Mobility Wheelchair Mobility: No              PT Diagnosis: Generalized weakness  PT Problem List: Decreased strength;Decreased range of motion;Decreased activity tolerance;Decreased balance;Decreased mobility;Decreased coordination PT Treatment Interventions: Therapeutic exercise;Balance training;Neuromuscular re-education   PT Goals Acute Rehab PT Goals PT Goal Formulation: With patient/family Time For Goal Achievement: 06/18/12 Potential to Achieve Goals: Good Pt will Roll Supine to Right Side: with mod assist PT Goal: Rolling Supine to Right Side - Progress: Goal set today Pt will Roll Supine to Left Side: with mod assist PT Goal: Rolling Supine to Left Side - Progress: Goal set today Pt will Transfer Bed to Chair/Chair to Bed: with max assist PT Transfer Goal: Bed to Chair/Chair to Bed - Progress: Goal set today PT Goal: Home Exercise Program: with min assist PT Goal: Home Exercise Program - Progress:  Goal set today  Visit Information  Last PT Received On: 06/04/12 Assistance Needed: +2    Subjective Data  Subjective: "I'm doing well." Patient Stated Goal: To go home.  Pt's wife stated her goal for [name] is to be able to help with transfers to a chair if possible  and move in the bed (roll) better.    Prior Functioning  Home Living Lives With: Spouse Available Help at Discharge: Family;Available 24 hours/day Type of Home: House Home Access: Ramped entrance Home Layout: One level Additional Comments: Pt's wife  reports they have WC, hospital bed, trapeze bar, manual hoyer lift, and ramped entrance to house.  She states that she helps him with everything as his scoliosis makes it difficult to sit up and his MS has greatly affected his right arm and leg making it difficult for him to feed himself.  Prior Function Level of  Independence: Needs assistance Needs Assistance: Bathing;Toileting;Feeding;Dressing;Grooming;Transfers;Meal Prep;Light Housekeeping Able to Take Stairs?: No Driving: No Communication Communication: No difficulties Dominant Hand: Right    Cognition  Overall Cognitive Status: Appears within functional limits for tasks assessed/performed Arousal/Alertness: Awake/alert Orientation Level: Oriented X4 / Intact Behavior During Session: [facility] for tasks performed    Extremity/Trunk Assessment Right Upper Extremity Assessment RUE ROM/Strength/Tone: Deficits RUE ROM/Strength/Tone Deficits: limited ROM and strength due to MS; able to lift hand to chin with maximal effort.  Left Upper Extremity Assessment LUE ROM/Strength/Tone: Deficits LUE ROM/Strength/Tone Deficits: Left arm is stronger than right, with full ROM, however pt has difficulty manuevering self in bed. Right Lower Extremity Assessment RLE ROM/Strength/Tone: Deficits RLE ROM/Strength/Tone Deficits: Difficulty and pain with hip flexion and abduction; 2-/5.  less pain and able to adduct without difficulty; performed in supine  Left Lower Extremity Assessment LLE ROM/Strength/Tone: Deficits LLE ROM/Strength/Tone Deficits: No pain and able to fully flex and abduct left hip, 3/5   Balance Balance Balance Assessed: Yes Static Sitting Balance Static Sitting - Balance Support: No upper extremity supported Static Sitting - Level of Assistance: 1: +2 Total assist Static Sitting - Comment/# of Minutes: long sitting in bed: difficulty secondary to weakness and scoliosis  End of Session PT - End of Session Activity Tolerance: Patient limited by pain;Patient limited by fatigue Patient left: in bed;with call bell/phone within reach;with family/visitor present Nurse Communication: Mobility status       Sharion Balloon 06/04/2012, 1:10 PM Sharion Balloon, SPT Acute Rehab Services [phone]  [facility] Acute  Rehabilitation [phone] (pager)

## 2012-06-04 NOTE — Progress Notes (Signed)
Occupational Therapy Evaluation  06/04/12 1258  OT Visit Information  Last OT Received On 06/04/12  Assistance Needed +1  OT Time Calculation  OT Start Time 1231  OT Stop Time 1244  OT Time Calculation (min) 13 min  Restrictions  Weight Bearing Restrictions No  ADL  Eating/Feeding Moderate assistance  Where Assessed - Eating/Feeding Bed level  Grooming Wash/dry hands;Wash/dry face;Brushing hair;Set up  Where Assessed - Grooming Supine, head of bed up  Upper Body Bathing +1 Total assistance  Where Assessed - Upper Body Bathing Supine, head of bed up  Lower Body Bathing +1 Total assistance  Where Assessed - Lower Body Bathing Rolling right and/or left  Upper Body Dressing +1 Total assistance  Where Assessed - Upper Body Dressing Supine, head of bed up  Lower Body Dressing +1 Total assistance  Where Assessed - Lower Body Dressing Rolling right and/or left  Toilet Transfer +1 Total assistance (pt unable)  Toileting - Clothing Manipulation and Hygiene +1 Total assistance  Where Assessed - Toileting Clothing Manipulation and Hygiene Rolling right and/or left  Tub/Shower Transfer (n/a)  Equipment Used (trapeze)  ADL Comments Pt. reports that wife has been assisting him with all ADLs PTA and they use a hoyer lift.  Pt. states he fed self today, but question accuracy of this based on simulated task  Cognition  Overall Cognitive Status Impaired  Arousal/Alertness Awake/alert  Orientation Level Oriented X4 / Intact  Behavior During Session [facility] for tasks performed  Cognition - Other Comments Pt appears intermittently/mildly confused - unsure of baseline  Bed Mobility  Bed Mobility Rolling Right;Rolling Left  Rolling Right 3: Mod assist  Rolling Left 2: Max assist  Details for Bed Mobility Assistance Pt. requires step by step cues for sequence.  Has difficulty lifting Rt. hip off bed when rolling to Lt.  OT - End of Session  Activity Tolerance Patient limited by fatigue  Patient left  in bed;with call bell/phone within reach  OT Assessment/Plan  OT Frequency Min 2X/week  Follow Up Recommendations No OT follow up;Supervision/Assistance - 24 hour  Equipment Recommended None recommended by OT  Acute Rehab OT Goals  OT Goal Formulation With patient  Time For Goal Achievement 06/18/12  Potential to Achieve Goals Good  ADL Goals  Pt Will Perform Eating with set-up;Supported;Supine, head of bed up;with adaptive utensils  ADL Goal: Eating - Progress Goal set today  Additional ADL Goal #1 Pt. will roll Lt. and Rt. with min A to allow wife to assist pt with toileting  OT General Charges  $OT Visit 1 Procedure  OT Evaluation  $Initial OT Evaluation Tier I 1 Procedure  OT Treatments  $Therapeutic Activity 8-22 mins

## 2012-06-04 NOTE — Progress Notes (Signed)
Hypoglycemic Event  CBG: 51  Treatment: 15 GM carbohydrate snack  Symptoms: None  Follow-up CBG: Time:2300 CBG Result:111  Possible Reasons for Event: Unknown  Comments/MD notified:Pt came up to 111.    Jaime [name]  Remember to initiate Hypoglycemia Order Set & complete

## 2012-06-05 ENCOUNTER — Encounter (HOSPITAL_COMMUNITY): Payer: Self-pay | Admitting: General Practice

## 2012-06-05 DIAGNOSIS — G35 Multiple sclerosis: Secondary | ICD-10-CM

## 2012-06-05 DIAGNOSIS — G35D Multiple sclerosis, unspecified: Secondary | ICD-10-CM

## 2012-06-05 LAB — GLUCOSE, CAPILLARY
Glucose-Capillary: 42 mg/dL — CL (ref 70–99)
Glucose-Capillary: 108 mg/dL — ABNORMAL HIGH (ref 70–99)
Glucose-Capillary: 126 mg/dL — ABNORMAL HIGH (ref 70–99)

## 2012-06-05 LAB — BASIC METABOLIC PANEL
GFR calc Af Amer: >90 mL/min (ref >90)
GFR calc non Af Amer: >90 mL/min (ref >90)
Potassium: 3.7 mEq/L (ref 3.5–5.1)
Sodium: 134 mEq/L — ABNORMAL LOW (ref 135–145)

## 2012-06-05 LAB — CBC
Hemoglobin: 12.4 g/dL — ABNORMAL LOW (ref 13.0–17.0)
MCHC: 32.6 g/dL (ref 30.0–36.0)
RBC: 4.8 MIL/uL (ref 4.22–5.81)

## 2012-06-05 MED ORDER — HYDROCODONE-ACETAMINOPHEN 5-500 MG PO TABS: 1.0000 | ORAL_TABLET | Freq: Four times a day (QID) | ORAL | Status: DC | PRN

## 2012-06-05 NOTE — Progress Notes (Signed)
Orthopedic Tech Progress Note Patient Details:  Jaime [name] Nov 18, 1940 [id number] Order completes by [employer] Patient ID: Jaime [name], male   DOB: 03-26-1941, 71 y.o.   MRN: [id number]   Jaime [name] 06/05/2012, 3:36 PM

## 2012-06-05 NOTE — Progress Notes (Signed)
Pt/family given discharge instructions, medication lists, follow up appointments, and when to call the doctor.  Pt/family verbalizes understanding. Pt given signs and symptoms of infection. Jaime [name]    

## 2012-06-05 NOTE — Progress Notes (Signed)
Physical Therapy Treatment Patient Details Name: Jaime [name] MRN: [id number] DOB: 30-Apr-1941 Today's Date: 06/05/2012 Time: 1478-2956 PT Time Calculation (min): 18 min  PT Assessment / Plan / Recommendation Comments on Treatment Session  Pt s/p right AKA (06/03/12), with history of severe debilitating MS and s/p left AKA a few months ago.  Pt with improved strength and ROM today.  Educated on proper use of trapeze bar at home for transfer and discussed with case manager about; recommend pressure relieving cushion and pt/family requesting electric hoyer lift.     Follow Up Recommendations  Home health PT     Does the patient have the potential to tolerate intense rehabilitation     Barriers to Discharge        Equipment Recommendations  Other (comment) (pressure relieving cushion for power chair)    Recommendations for Other Services    Frequency Min 3X/week   Plan Discharge plan remains appropriate;Frequency remains appropriate    Precautions / Restrictions Restrictions Weight Bearing Restrictions: No   Pertinent Vitals/Pain no apparent distress Some pain: reports 5/10 with rest, increased with activity    Mobility  Bed Mobility Bed Mobility: Scooting to HOB Scooting to HOB: 6: Modified independent (Device/Increase time);With trapeze Details for Bed Mobility Assistance: Pt able to demonstrate good use of trapeze to position self in bed; able to use bilat UE to scoot to [facility].  Transfers Transfers: Not assessed Ambulation/Gait Ambulation/Gait Assistance: Not tested (comment) Stairs: No Wheelchair Mobility Wheelchair Mobility: No    Exercises Amputee Exercises Quad Sets: Limitations;5 reps;Both;Supine;AAROM Quad Sets Limitations: Bilat AKA; right weaker than left secondary to MS and pain from recent AKA on right Hip ABduction/ADduction: Right;10 reps;Supine;Limitations;AAROM (bilat AKA) Hip Flexion/Marching: Supine;10 reps;Right;AAROM Details about exercises: verbal  and tactile cues for muscle activation. Educated patient on energy conservation and strengthening for improved mobility.         PT Goals Acute Rehab PT Goals PT Goal: Rolling Supine to Right Side - Progress: Progressing toward goal PT Goal: Rolling Supine to Left Side - Progress: Progressing toward goal PT Transfer Goal: Bed to Chair/Chair to Bed - Progress: Progressing toward goal  Visit Information  Last PT Received On: 06/05/12 Assistance Needed: +1    Subjective Data  Subjective: "I'm doing well." Patient Stated Goal: To go home.  Pt's wife stated her goal for Jaime [name] is to be able to help with transfers to a chair and move in the bed (roll) better.    Cognition  Overall Cognitive Status: Appears within functional limits for tasks assessed/performed Arousal/Alertness: Awake/alert Orientation Level: Appears intact for tasks assessed Behavior During Session: [facility] for tasks performed         End of Session PT - End of Session Activity Tolerance: Patient limited by pain Patient left: in bed;with call bell/phone within reach;with bed alarm set;with family/visitor present Nurse Communication: Mobility status;Need for lift equipment       Sharion Balloon 06/05/2012, 4:11 PM  Sharion Balloon, SPT Acute Rehab Services [phone]

## 2012-06-05 NOTE — Progress Notes (Addendum)
06/05/2012 7:42 AM POD 2  Subjective:  Ready to go home  Tm 99.1  VSS Filed Vitals:   06/05/12 0645  BP: 133/87  Pulse: 78  Temp: 98.1 F (36.7 C)  Resp: 18    Physical Exam: Incisions:  C/d/i with staples in tact.  No drainage.   CBC    Component Value Date/Time   WBC 7.9 06/05/2012 0445   RBC 4.80 06/05/2012 0445   HGB 12.4* 06/05/2012 0445   HCT 38.0* 06/05/2012 0445   PLT 137* 06/05/2012 0445   MCV 79.2 06/05/2012 0445   MCH 25.8* 06/05/2012 0445   MCHC 32.6 06/05/2012 0445   RDW 17.8* 06/05/2012 0445   LYMPHSABS 2.0 05/05/2012 0917   MONOABS 0.4 05/05/2012 0917   EOSABS 0.3 05/05/2012 0917   BASOSABS 0.0 05/05/2012 0917    BMET    Component Value Date/Time   NA 134* 06/05/2012 0445   K 3.7 06/05/2012 0445   CL 101 06/05/2012 0445   CO2 26 06/05/2012 0445   GLUCOSE 155* 06/05/2012 0445   GLUCOSE 110* 05/12/2006 1139   BUN 13 06/05/2012 0445   CREATININE 0.59 06/05/2012 0445   CALCIUM 8.6 06/05/2012 0445   GFRNONAA >90 06/05/2012 0445   GFRAA >90 06/05/2012 0445    INR    Component Value Date/Time   INR 0.98 06/02/2012 1301     Intake/Output Summary (Last 24 hours) at 06/05/12 0742 Last data filed at 06/05/12 0645  Gross per 24 hour  Intake    600 ml  Output   1225 ml  Net   -625 ml     Assessment/Plan:  71 y.o. male is s/p right above knee amputation  POD 2  -pt doing well this am. -incision looks good -wants to go home-has all equipment and needs already provided.   Doreatha Massed, PA-C [facility] [phone] 06/05/2012 7:42 AM  Addendum  I have independently interviewed and examined the patient, and I agree with the physician assistant's findings.  Viable stump with intact staple line.  Pt can be d/c home once home needs are meet.   Leonides Sake, MD [facility] [place] Office: [phone] Pager: [phone]  06/05/2012, 1:40 PM

## 2012-06-05 NOTE — Care Management Note (Signed)
    Page 1 of 2   06/05/2012     4:42:10 PM   CARE MANAGEMENT NOTE 06/05/2012  Patient:  Jaime [name], Jaime [name]   Account Number:  [id number]  Date Initiated:  06/05/2012  Documentation initiated by:  Anjuli Gemmill  Subjective/Objective Assessment:   PT ADM ON 06/03/12 S/P RT AKA.  PTA, PT LIVES AT HOME WITH WIFE AS CAREGIVER.  HE IS ESSENTIALLY BEDBOUND, AND IS FOLLOWED BY [facility] FOR HHRN AND HHPT.     Action/Plan:   MET WITH PT AND WIFE TO DISCUSS DC PLANS. PT WISHES TO CONT WITH [facility] FOR HOME NEEDS.  SHE IS INTERESTED IN GETTING A MECHANICAL LIFT FOR PT, AS SHE IS UNABLE TO USE MANUAL ONE DUE TO HER BACK PROBLEMS.   Anticipated DC Date:  06/06/2012   Anticipated DC Plan:  HOME W HOME HEALTH SERVICES      DC Planning Services  CM consult      [facility] Choice  HOME HEALTH   Choice offered to / List presented to:  C-1 Patient   DME arranged  OTHER - SEE COMMENT      DME agency  [facility]     HH arranged  HH-1 RN  HH-2 PT  HH-3 OT  HH-4 NURSE'S AIDE      Status of service:  Completed, signed off Medicare Important Message given?   (If response is "NO", the following Medicare IM given date fields will be blank) Date Medicare IM given:   Date Additional Medicare IM given:    Discharge Disposition:  HOME W HOME HEALTH SERVICES  Per UR Regulation:  Reviewed for med. necessity/level of care/duration of stay  If discussed at Long Length of Stay Meetings, dates discussed:    Comments:  06/05/12 Jaime Rentz,RN,BSN [id number] PER [facility], UNABLE TO MAKE EXCEPTION FOR PT FOR MECHANICAL LIFT.  COST TO BUY LIFT IS $3322.00, AND WIFE STATES THEY MAY PURCHASE ONE OUTRIGHT.  PT FOR DC HOME TODAY WITH [facility] TO FOLLOW AT HOME.  START OF CARE 24-48H POST DC DATE.  NEW WHEELCHAIR CUSHION ORDERED THROUGH [facility] TO ASSIST WITH POSITIONING, PER OT RECOMMENDATIONS.  06/05/12 Jaime Mathieson,RN,BSN [id number] PER JUSTIN WITH [facility], MECHANICAL LIFTS ARE ONLY PAID BY INSURANCE FOR PTS >400 LBS.  RENTAL  FOR THIS LIFT IS $330/MONTH.  CALLED BACK TO SEE IF THERE IS ANY WAY THEY CAN LOOK INTO EXCEPTION FOR PT, AS REGULAR LIFT DOES NOT WORK FOR HIM. WIFE INTERESTED IN AN "ORDERLY" TO ASSIST HER AT HOME FROM TIME TO TIME...SHE WOULD PREFER A MALE, WHO COULD ASSIST ON DAYS THAT PT HAS DOCTOR'S APPTS, ETC. FOR TRANSFERRING AND TRANSPORTS.  WIFE GIVEN PRIVATE DUTY SITTER LIST FOR REFERENCE.

## 2012-06-05 NOTE — Discharge Summary (Signed)
Physician Discharge Summary  Jaime [name] XBJ:[id number] DOB: [birth year] DOA: 06/03/2012  PCP: Willow Ora, MD  Admit date: 06/03/2012 Discharge date: 06/05/2012  Time spent: >30 minutes  Recommendations for Outpatient Follow-up:      Follow-up Information    Follow up with Nilda Simmer, MD. On 07/03/2012. (Office will call you to arrange your appt (sent) at 10:45 am)    Contact information:   [address] [place] [address] [phone]       Follow up with Willow Ora, MD. (In one to 2 weeks, call for appointment upon discharge.)    Contact information:   [address] [facility] [address] [place] [address] [phone]           Discharge Diagnoses:  Active Problems:  Multiple sclerosis  CAD (coronary artery disease)  Critical lower limb ischemia  Cardiomyopathy, ischemic  Hypertension  Diabetes mellitus   Discharge Condition: stable  Diet recommendation: Modified carbohydrate  There were no vitals filed for this visit.  History of present illness:  Jaime [name] is a 71 y.o. male with history of severe debilitating multiple sclerosis and status post left AKA a few months will presented today for right AKA per Dr. Imogene Burn. The patient reported that he had been having severe rest pain in his left foot and so was scheduled for the AKA today. His past medical history includes diabetes mellitus, hypertension, ischemic cardiomyopathy with last ejection fraction 25%, and Following his AKA today the hospitalist service was asked to admit the patient. He denies chest pain, shortness of breath, fevers, dysuria, diarrhea melena and no hematochezia.   Hospital Course by problem list:  Right leg gangrene/Critical lower limb ischemia-status post right AKA  -As discussed above, Dr. Imogene Burn took patient to OR on 11/13 for the right AKA which he tolerated without complications. -PTOT was consulted and followed patient while in the hospital, OT are recommended  new wheelchair cushioning for patient to assist with his transfers as he is now a bilateral amputee. -CIR was asked to evaluate the patient for possible inpatient rehabilitation and following evaluation they declined patient recommended home health services which case manager has assisted in setting up.  -His to followup with Dr. Imogene Burn on 12/13 as scheduled. Multiple sclerosis, severe  -Was maintained on his outpatient medications which is to continue upon discharge.  CAD (coronary artery disease)  -He is chest pain-free, continue outpatient medications  Cardiomyopathy, ischemic/h/o of systolic heart failure  -Caution was used with IV fluids as it was noted his last EF was 25%  -He remained asymptomatic/compensated in the hospital Hypertension  -Continue metoprolol, when necessary IV antihypertensives as appropriate.  Diabetes mellitus type II, uncontrolled  -His Accu-Cheks were monitored and he was on 7030 with sliding scale coverage in the hospital. His to continue his outpatient regimen upon discharge. Suprapubic tenderness  -Urinalysis was obtained and came back negative.   Procedures:  Status post right above-knee amputation on 11/13, per Dr. Imogene Burn Consultations:  Vascular surgery-Dr. Imogene Burn  Discharge Exam: Filed Vitals:   06/04/12 1400 06/04/12 2223 06/05/12 0645 06/05/12 1417  BP: 113/76 118/67 133/87 131/89  Pulse:  74 78 73  Temp: 99.1 F (37.3 C) 98.5 F (36.9 C) 98.1 F (36.7 C) 98.7 F (37.1 C)  TempSrc: Oral Oral Oral Oral  Resp: 18 18 18 18   SpO2: 100% 99% 99% 98%    Exam:  General: Elderly black male, in no apparent distress  Cardiovascular: Regular rate and rhythm, normal S1-S2  Respiratory: clear to auscultation bilaterally, no crackles or wheezes  Abdomen: Soft, bowel sounds present, mild suprapubic tenderness nondistended no organomegaly and no masses palpable  Extremities: Bilateral AKA, right with dressing clean and dry     Discharge  Instructions  Discharge Orders    Future Appointments: Provider: Department: Dept Phone: Center:   06/11/2012 10:30 AM Luis Abed, MD [facility] Main Office [place]) [phone] [facility]   07/03/2012 10:45 AM Fransisco Hertz, MD [facility] [phone] [facility]   08/03/2012 12:30 PM Erick Colace, MD Dr. Claudette Laws- [facility] [phone] None   10/12/2012 11:00 AM Wanda Plump, MD [facility]  [place] [phone] [facility]     Future Orders Please Complete By Expires   Diet Carb Modified      Resume previous diet      Call MD for:  temperature >100.5      Call MD for:  redness, tenderness, or signs of infection (pain, swelling, bleeding, redness, odor or green/yellow discharge around incision site)      Call MD for:  severe or increased pain, loss or decreased feeling  in affected limb(s)      may wash over wound with mild soap and water      Increase activity slowly          Medication List     As of 06/05/2012  4:00 PM    TAKE these medications         amantadine 100 MG capsule   Commonly known as: SYMMETREL   Take 1 capsule (100 mg total) by mouth 2 (two) times daily.      aspirin EC 81 MG tablet   Take 81 mg by mouth daily.      baclofen 5 mg Tabs   Commonly known as: LIORESAL   Take 0.5 tablets (5 mg total) by mouth 2 (two) times daily.      diclofenac sodium 1 % Gel   Commonly known as: VOLTAREN   Apply 1 application topically 4 (four) times daily as needed. For pain      gabapentin 300 MG capsule   Commonly known as: NEURONTIN   Take 300 mg by mouth 4 (four) times daily.      HYDROcodone-acetaminophen 5-500 MG per tablet   Commonly known as: VICODIN   Take 1 tablet by mouth every 6 (six) hours as needed for pain.      insulin aspart protamine-insulin aspart (70-30) 100 UNIT/ML injection   Commonly known as: NOVOLOG 70/30   Inject 4 Units into the skin daily with breakfast.      insulin  aspart protamine-insulin aspart (70-30) 100 UNIT/ML injection   Commonly known as: NOVOLOG 70/30   Inject 30 Units into the skin daily with supper. Take 30 units at breakfast and 30 units at supper      meloxicam 15 MG tablet   Commonly known as: MOBIC   Take 15 mg by mouth daily.      metoprolol succinate 12.5 mg Tb24   Commonly known as: TOPROL-XL   Take 37.5 mg by mouth daily.      nortriptyline 10 MG capsule   Commonly known as: PAMELOR   Take 1 capsule (10 mg total) by mouth at bedtime.      pantoprazole 40 MG tablet   Commonly known as: PROTONIX   Take 1 tablet (40 mg total) by mouth daily at 12 noon.      simvastatin 40 MG tablet  Commonly known as: ZOCOR   Take 1 tablet (40 mg total) by mouth at bedtime.           Follow-up Information    Follow up with Nilda Simmer, MD. On 07/03/2012. (Office will call you to arrange your appt (sent) at 10:45 am)    Contact information:   [address] [place] [address] [phone]       Follow up with Willow Ora, MD. (In one to 2 weeks, call for appointment upon discharge.)    Contact information:   [address] [facility] [address] [place] [address] [phone]           The results of significant diagnostics from this hospitalization (including imaging, microbiology, ancillary and laboratory) are listed below for reference.    Significant Diagnostic Studies: No results found.  Microbiology: Recent Results (from the past 240 hour(s))  SURGICAL PCR SCREEN     Status: Normal   Collection Time   06/02/12  1:00 PM      Component Value Range Status Comment   MRSA, PCR NEGATIVE  NEGATIVE Final    Staphylococcus aureus NEGATIVE  NEGATIVE Final      Labs: Basic Metabolic Panel:  Lab 06/05/12 6962 06/04/12 0530 06/03/12 1547 06/02/12 1301  NA 134* 137 -- 140  K 3.7 4.0 -- 4.0  CL 101 103 -- 105  CO2 26 25 -- 27  GLUCOSE 155* 151* -- 216*  BUN 13 12 -- 20  CREATININE 0.59 0.62 0.61 0.77   CALCIUM 8.6 8.7 -- 9.3  MG -- -- -- --  PHOS -- -- -- --   Liver Function Tests: No results found for this basename: AST:5,ALT:5,ALKPHOS:5,BILITOT:5,PROT:5,ALBUMIN:5 in the last 168 hours No results found for this basename: LIPASE:5,AMYLASE:5 in the last 168 hours No results found for this basename: AMMONIA:5 in the last 168 hours CBC:  Lab 06/05/12 0445 06/04/12 0530 06/03/12 1547 06/02/12 1301  WBC 7.9 6.6 9.2 5.5  NEUTROABS -- -- -- --  HGB 12.4* 13.2 13.9 14.1  HCT 38.0* 40.3 41.7 43.2  MCV 79.2 80.3 80.2 80.0  PLT 137* 143* 133* 146*   Cardiac Enzymes: No results found for this basename: CKTOTAL:5,CKMB:5,CKMBINDEX:5,TROPONINI:5 in the last 168 hours BNP: BNP (last 3 results) No results found for this basename: PROBNP:3 in the last 8760 hours CBG:  Lab 06/05/12 1120 06/05/12 0642 06/04/12 2304 06/04/12 2229 06/04/12 2208  GLUCAP 126* 108* 111* 51* 39*       Signed:  Messiah Ahr C  [facility] 06/05/2012, 4:00 PM

## 2012-06-05 NOTE — Progress Notes (Signed)
Orthopedic Tech Progress Note Patient Details:  Jaime [name] 02/21/1941 [id number] [employer] called to place order for retension sock Patient ID: Jaime [name], male   DOB: Mar 22, 1941, 71 y.o.   MRN: [id number]   Orie Rout 06/05/2012, 12:10 PM

## 2012-06-05 NOTE — Progress Notes (Signed)
06/05/2012 Milana Kidney DPT PAGER: [phone] OFFICE: [phone]

## 2012-06-05 NOTE — Discharge Summary (Signed)
[facility] Discharge Summary  Jaime [name] 1941/06/01 71 y.o. male  [id number]  Admission Date: 06/03/2012  Discharge Date: 06/05/12  Physician: Kela Millin, MD  Admission Diagnosis: Peripheral Vascular Disease   HPI:   This is a 71 y.o. male who presents with chief complaint: R leg pain. The patient presents today for R AKA. This pt has severe debilitating MS and previous has undergone L AKA. He now has rest pain and tissue loss in right foot.  Hospital Course:  The patient was admitted to the hospital and taken to the operating room on 06/03/2012 and underwent right AKA.  The pt tolerated the procedure well and was transported to the PACU in good condition. Post operatively, he has done well. A retention sock was ordered.  His incision healing nicely. He has all of his medical needs met at home and is not a candidate for CIR.  The remainder of the hospital course consisted of increasing mobilization and increasing intake of solids without difficulty.  CBC    Component Value Date/Time   WBC 7.9 06/05/2012 0445   RBC 4.80 06/05/2012 0445   HGB 12.4* 06/05/2012 0445   HCT 38.0* 06/05/2012 0445   PLT 137* 06/05/2012 0445   MCV 79.2 06/05/2012 0445   MCH 25.8* 06/05/2012 0445   MCHC 32.6 06/05/2012 0445   RDW 17.8* 06/05/2012 0445   LYMPHSABS 2.0 05/05/2012 0917   MONOABS 0.4 05/05/2012 0917   EOSABS 0.3 05/05/2012 0917   BASOSABS 0.0 05/05/2012 0917    BMET    Component Value Date/Time   NA 134* 06/05/2012 0445   K 3.7 06/05/2012 0445   CL 101 06/05/2012 0445   CO2 26 06/05/2012 0445   GLUCOSE 155* 06/05/2012 0445   GLUCOSE 110* 05/12/2006 1139   BUN 13 06/05/2012 0445   CREATININE 0.59 06/05/2012 0445   CALCIUM 8.6 06/05/2012 0445   GFRNONAA >90 06/05/2012 0445   GFRAA >90 06/05/2012 0445     Discharge Instructions:   The patient is discharged to home with extensive instructions on wound care and progressive ambulation.  They  are instructed not to drive or perform any heavy lifting until returning to see the physician in his office.  Discharge Orders    Future Appointments: Provider: Department: Dept Phone: Center:   06/11/2012 10:30 AM Luis Abed, MD [facility] Main Office [place]) [phone] [facility]   07/03/2012 10:45 AM Fransisco Hertz, MD [facility] [phone] [facility]   08/03/2012 12:30 PM Erick Colace, MD Dr. Claudette Laws- [facility] [phone] None   10/12/2012 11:00 AM Wanda Plump, MD [facility]  [place] [phone] [facility]     Future Orders Please Complete By Expires   Resume previous diet      Call MD for:  temperature >100.5      Call MD for:  redness, tenderness, or signs of infection (pain, swelling, bleeding, redness, odor or green/yellow discharge around incision site)      Call MD for:  severe or increased pain, loss or decreased feeling  in affected limb(s)      may wash over wound with mild soap and water         Discharge Diagnosis:  Peripheral Vascular Disease  Secondary Diagnosis: Patient Active Problem List  Diagnosis  . DM  . HYPERLIPIDEMIA  . TOBACCO ABUSE  . Multiple sclerosis  . CHF  . UNSPECIFIED SYSTOLIC HEART FAILURE  . BPH (benign prostatic hyperplasia)  . SHOULDER  PAIN  . CAD (coronary artery disease)  . Carotid artery disease  . Other disorder of muscle, ligament, and fascia  . Critical lower limb ischemia  . Atherosclerotic PVD with ulceration  . Unilateral AKA  . Urinary tract infection after immobility  . Atherosclerosis of native arteries of the extremities with ulceration(440.23)  . Ejection fraction  . Cardiomyopathy, ischemic  . Hyperlipidemia  . Pulmonary hypertension  . Lung nodule  . Tobacco abuse  . Urinary retention  . Hemiparesis  . Hypertension  . S/P AKA (above knee amputation)  . Preop cardiovascular exam  . 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  Diabetes mellitus   Past Medical History  Diagnosis Date  . Hyperlipidemia   . Cardiomyopathy, ischemic     EF 45% per ECHO 2008  //   EF 25%, echo, August, 2013  . Pulmonary hypertension     moderate ECHO Jan 2008  . Systolic heart failure   . Multiple sclerosis DX 1993    NEUROLOGIST-  DR DOHMEIER -- LAST VISIT 11-20-2010  NOTE W/ CHART  . Lung nodule     resolved 11-2006 CT Chest  . Tobacco abuse   . Increased prostate specific antigen (PSA) velocity   . Urinary retention     dx ~ 2-12, like from MS, now with a catheter, saw urology  . Ejection fraction      EF 45%, echo, 2008  //   EF 25%, echo, August, 2013  . Cellulitis   . Pre-syncope     Near syncope, ER visit, October, 2011  . Carotid artery disease     Doppler, February, 2012, 0-39% bilateral,Mild smooth plaque  . Chronic indwelling foley catheter   . Scoliosis associated with other condition   . Hemiparesis   . Fatigue SEVERE  . Weakness   . Impotence   . MI, acute, non ST segment elevation 2007    S/P PCI WITH X1 STENT (TAXUS DRUG-ELUTING) LEFT CIRCUMFLEX  . Echocardiogram abnormal 08-17-2006    SEVERE HYPOKINESIS OF INFERIOR , POSTEROR WALLS OF BASE AND MID VENTRICLE AND MOD. PULMONARY HTN  . Diabetes mellitus     INSULIN-DEPENDANT  . Hypertension   . H/O pleural effusion 2008    POST THORACENTESIS  . History of colon polyps PRECANCEROUS  . Insomnia   . Ingrown right big toenail CHRONIC    PER PT VERY SORE  . CAD (coronary artery disease) CARDIOLOGIST- DR KATZ-- VISIT 06-05-2011 IN [facility]    non-STEMI, 2007.Marland Kitchenoccluded circumflex.. Taxus stent placed...residual 80% LAD...50% RCA  . Pneumonia     hx of  . Urinary tract infection     hx of  . Atherosclerotic PVD with ulceration     left foot  . S/P AKA (above knee amputation)     left AKA August, 2013, after vascular surgery failed  . Preop cardiovascular exam     Cardiac clearance for possible right AKA, September, 2013  . Neuromuscular disorder      multiple sclerosis      Jaime [name], Jaime [name]  Home Medication Instructions ZOX:[id number]   Printed on:06/05/12 0749  Medication Information                    simvastatin (ZOCOR) 40 MG tablet Take 1 tablet (40 mg total) by mouth at bedtime.           aspirin EC 81 MG tablet Take 81 mg by mouth daily.  diclofenac sodium (VOLTAREN) 1 % GEL Apply 1 application topically 4 (four) times daily as needed. For pain            amantadine (SYMMETREL) 100 MG capsule Take 1 capsule (100 mg total) by mouth 2 (two) times daily.           baclofen (LIORESAL) 5 mg TABS Take 0.5 tablets (5 mg total) by mouth 2 (two) times daily.           insulin aspart protamine-insulin aspart (NOVOLOG 70/30) (70-30) 100 UNIT/ML injection Inject 4 Units into the skin daily with breakfast.           nortriptyline (PAMELOR) 10 MG capsule Take 1 capsule (10 mg total) by mouth at bedtime.           pantoprazole (PROTONIX) 40 MG tablet Take 1 tablet (40 mg total) by mouth daily at 12 noon.           gabapentin (NEURONTIN) 300 MG capsule Take 300 mg by mouth 4 (four) times daily.            meloxicam (MOBIC) 15 MG tablet Take 15 mg by mouth daily.            insulin aspart protamine-insulin aspart (NOVOLOG 70/30) (70-30) 100 UNIT/ML injection Inject 30 Units into the skin daily with supper. Take 30 units at breakfast and 30 units at supper           metoprolol succinate (TOPROL-XL) 12.5 mg TB24 Take 37.5 mg by mouth daily.           HYDROcodone-acetaminophen (VICODIN) 5-500 MG per tablet Take 1 tablet by mouth every 6 (six) hours as needed for pain. #30 NR            Disposition: home  Patient's condition: is Good  Follow up: 1. Dr. Imogene Burn in 4 weeks for staple removal   Doreatha Massed, PA-C [facility] [phone] 06/05/2012  7:49 AM  Addendum  I have independently interviewed and examined the patient, and I agree with the physician assistant's discharge summary.   Patient was admitted to Hospitalist service after a R AKA.  He has had a routine post-op course.  Leonides Sake, MD [facility] [place] Office: [phone] Pager: [phone]  06/05/2012, 9:32 PM

## 2012-06-05 NOTE — Clinical Social Work Note (Signed)
CSW received consult for dc planning. CSW discussed Pt in multi-disciplinary meeting. CM and nursing are familiar with Pt and agree that Pt will dc home with spouse. Pt also being considered by CIR, but Pt's wife has historically refused and has taken good care of Pt at home.  No CSW needs at this time, will sign off.   Frederico Hamman, LCSW [phone]

## 2012-06-08 LAB — URINE CULTURE

## 2012-06-09 ENCOUNTER — Other Ambulatory Visit: Payer: Self-pay | Admitting: *Deleted

## 2012-06-09 MED ORDER — CIPROFLOXACIN HCL 500 MG PO TABS: 500.0000 mg | ORAL_TABLET | Freq: Two times a day (BID) | ORAL | Status: DC

## 2012-06-09 NOTE — Telephone Encounter (Signed)
abx sent in to pharmacy.

## 2012-06-11 ENCOUNTER — Ambulatory Visit (INDEPENDENT_AMBULATORY_CARE_PROVIDER_SITE_OTHER): Payer: Medicare Other | Admitting: Cardiology

## 2012-06-11 ENCOUNTER — Encounter: Payer: Self-pay | Admitting: Cardiology

## 2012-06-11 VITALS — BP 128/76 | HR 64 | Ht 71.0 in | Wt 153.0 lb

## 2012-06-11 DIAGNOSIS — I10 Essential (primary) hypertension: Secondary | ICD-10-CM | POA: Diagnosis not present

## 2012-06-11 DIAGNOSIS — E785 Hyperlipidemia, unspecified: Secondary | ICD-10-CM | POA: Diagnosis not present

## 2012-06-11 DIAGNOSIS — I2589 Other forms of chronic ischemic heart disease: Secondary | ICD-10-CM | POA: Diagnosis not present

## 2012-06-11 DIAGNOSIS — I2510 Atherosclerotic heart disease of native coronary artery without angina pectoris: Secondary | ICD-10-CM

## 2012-06-11 DIAGNOSIS — I255 Ischemic cardiomyopathy: Secondary | ICD-10-CM

## 2012-06-11 MED ORDER — BENAZEPRIL HCL 5 MG PO TABS: 5.0000 mg | ORAL_TABLET | Freq: Every day | ORAL | Status: DC

## 2012-06-11 NOTE — Patient Instructions (Addendum)
Your physician has recommended you make the following change in your medication: START Lotensin 5 mg daily  Your physician recommends that you schedule a follow-up appointment in: 5-6 weeks

## 2012-06-11 NOTE — Assessment & Plan Note (Signed)
Blood pressure is controlled. No change in therapy. 

## 2012-06-11 NOTE — Assessment & Plan Note (Signed)
Been treated. No change in therapy.

## 2012-06-11 NOTE — Progress Notes (Signed)
HPI  Patient is seen to followup his cardiac status after his second leg amputation. This procedure was done. He is stable. He was stable from the cardiac viewpoint during the procedure and afterwards. He's not having any chest pain or shortness of breath.  Allergies  Allergen Reactions  . Bee Venom Anaphylaxis    Current Outpatient Prescriptions  Medication Sig Dispense Refill  . amantadine (SYMMETREL) 100 MG capsule Take 1 capsule (100 mg total) by mouth 2 (two) times daily.  60 capsule  1  . aspirin EC 81 MG tablet Take 81 mg by mouth daily.      . baclofen (LIORESAL) 5 mg TABS Take 0.5 tablets (5 mg total) by mouth 2 (two) times daily.  60 each  1  . ciprofloxacin (CIPRO) 500 MG tablet Take 1 tablet (500 mg total) by mouth 2 (two) times daily.  20 tablet  0  . diclofenac sodium (VOLTAREN) 1 % GEL Apply 1 application topically 4 (four) times daily as needed. For pain       . gabapentin (NEURONTIN) 300 MG capsule Take 300 mg by mouth 4 (four) times daily.       Marland Kitchen HYDROcodone-acetaminophen (VICODIN) 5-500 MG per tablet Take 1 tablet by mouth every 6 (six) hours as needed for pain.  40 tablet  0  . insulin aspart protamine-insulin aspart (NOVOLOG 70/30) (70-30) 100 UNIT/ML injection Inject 10 Units into the skin daily with supper. Take 30 units at breakfast and 30 units at supper      . insulin aspart protamine-insulin aspart (NOVOLOG 70/30) (70-30) 100 UNIT/ML injection Inject 30 Units into the skin daily with breakfast.      . meloxicam (MOBIC) 15 MG tablet Take 15 mg by mouth daily.       . metoprolol succinate (TOPROL-XL) 12.5 mg TB24 Take 37.5 mg by mouth daily.      . nortriptyline (PAMELOR) 10 MG capsule Take 1 capsule (10 mg total) by mouth at bedtime.  30 capsule  1  . pantoprazole (PROTONIX) 40 MG tablet Take 1 tablet (40 mg total) by mouth daily at 12 noon.  30 tablet  1  . simvastatin (ZOCOR) 40 MG tablet Take 1 tablet (40 mg total) by mouth at bedtime.  90 tablet  1  .  [DISCONTINUED] insulin aspart protamine-insulin aspart (NOVOLOG 70/30) (70-30) 100 UNIT/ML injection Inject 4 Units into the skin daily with breakfast.  10 mL  1    History   Social History  . Marital Status: Married    Spouse Name: N/A    Number of Children: 2  . Years of Education: N/A   Occupational History  . disable    Social History Main Topics  . Smoking status: Former Smoker -- 50 years  . Smokeless tobacco: Former Neurosurgeon    Quit date: 08/05/2009     Comment: pt states that he is using E-cigs  . Alcohol Use: No  . Drug Use: No  . Sexually Active: Not on file     Comment: electronic cigarettes   Other Topics Concern  . Not on file   Social History Narrative   Lives w/ wife    Family History  Problem Relation Age of Onset  . Hypertension    . Heart attack Mother 41  . Heart disease Mother   . Stroke Mother   . Hyperlipidemia Mother   . Hypertension Mother   . Colon cancer Neg Hx   . Prostate cancer Neg Hx   .  COPD Father   . Peripheral vascular disease Father   . Diabetes Brother   . Heart disease Brother   . Hypertension Brother   . Heart attack Brother   . Diabetes Daughter     Past Medical History  Diagnosis Date  . Hyperlipidemia   . Cardiomyopathy, ischemic     EF 45% per ECHO 2008  //   EF 25%, echo, August, 2013  . Pulmonary hypertension     moderate ECHO Jan 2008  . Systolic heart failure   . Multiple sclerosis DX 1993    NEUROLOGIST-  DR DOHMEIER -- LAST VISIT 11-20-2010  NOTE W/ CHART  . Lung nodule     resolved 11-2006 CT Chest  . Tobacco abuse   . Increased prostate specific antigen (PSA) velocity   . Urinary retention     dx ~ 2-12, like from MS, now with a catheter, saw urology  . Ejection fraction      EF 45%, echo, 2008  //   EF 25%, echo, August, 2013  . Cellulitis   . Pre-syncope     Near syncope, ER visit, October, 2011  . Carotid artery disease     Doppler, February, 2012, 0-39% bilateral,Mild smooth plaque  . Chronic  indwelling foley catheter   . Scoliosis associated with other condition   . Hemiparesis   . Fatigue SEVERE  . Weakness   . Impotence   . MI, acute, non ST segment elevation 2007    S/P PCI WITH X1 STENT (TAXUS DRUG-ELUTING) LEFT CIRCUMFLEX  . Echocardiogram abnormal 08-17-2006    SEVERE HYPOKINESIS OF INFERIOR , POSTEROR WALLS OF BASE AND MID VENTRICLE AND MOD. PULMONARY HTN  . Diabetes mellitus     INSULIN-DEPENDANT  . Hypertension   . H/O pleural effusion 2008    POST THORACENTESIS  . History of colon polyps PRECANCEROUS  . Insomnia   . Ingrown right big toenail CHRONIC    PER PT VERY SORE  . CAD (coronary artery disease) CARDIOLOGIST- DR Solan Vosler-- VISIT 06-05-2011 IN [facility]    non-STEMI, 2007.Marland Kitchenoccluded circumflex.. Taxus stent placed...residual 80% LAD...50% RCA  . Pneumonia     hx of  . Urinary tract infection     hx of  . Atherosclerotic PVD with ulceration     left foot  . S/P AKA (above knee amputation)     left AKA August, 2013, after vascular surgery failed  . Preop cardiovascular exam     Cardiac clearance for possible right AKA, September, 2013  . Neuromuscular disorder     multiple sclerosis    Past Surgical History  Procedure Date  . Hernia repair 1990    (R)  . Thoracentesis 2008    PLEURAL EFFUSION  . Cystoscopy 07/30/2011    Procedure: CYSTOSCOPY;  Surgeon: Lindaann Slough, MD;  Location: [facility];  Service: Urology;  Laterality: N/A;  . Transurethral resection of prostate 07/30/2011    Procedure: TRANSURETHRAL RESECTION OF THE PROSTATE (TURP);  Surgeon: Lindaann Slough, MD;  Location: [facility];  Service: Urology;  Laterality: N/A;  . Femoral-tibial bypass graft 03/11/2012    Procedure: BYPASS GRAFT FEMORAL-TIBIAL ARTERY;  Surgeon: Fransisco Hertz, MD;  Location: [facility] OR;  Service: Vascular;  Laterality: Left;  Left Femoral -Tibial trunk bypass, Endarterectomy of Tibial- Peroneal trunk with vein angioplasty.  . Intraoperative  arteriogram 03/11/2012    Procedure: INTRA OPERATIVE ARTERIOGRAM;  Surgeon: Fransisco Hertz, MD;  Location: [facility] OR;  Service: Vascular;  Laterality:  Left;  . Femoral-popliteal bypass graft 03/11/2012    Procedure: BYPASS GRAFT FEMORAL-POPLITEAL ARTERY;  Surgeon: Fransisco Hertz, MD;  Location: [facility] OR;  Service: Vascular;  Laterality: Left;  embolectomy left lower leg  . Amputation 03/17/2012    Procedure: AMPUTATION ABOVE KNEE;  Surgeon: Fransisco Hertz, MD;  Location: [facility] OR;  Service: Vascular;  Laterality: Left;  . Coronary angioplasty with stent placement 05-01-2006    OCCLUDED CIRCUMFLEX -- TAXUS STENT PLACMENT  AND RESIDUAL 80% LAD,  50% RCA  . Amputation 06/03/2012    Procedure: AMPUTATION ABOVE KNEE;  Surgeon: Fransisco Hertz, MD;  Location: [facility] OR;  Service: Vascular;  Laterality: Right;  . Above knee leg amputation 06/03/2012    rt      Patient Active Problem List  Diagnosis  . DM  . HYPERLIPIDEMIA  . TOBACCO ABUSE  . Multiple sclerosis  . CHF  . UNSPECIFIED SYSTOLIC HEART FAILURE  . BPH (benign prostatic hyperplasia)  . SHOULDER PAIN  . CAD (coronary artery disease)  . Carotid artery disease  . Other disorder of muscle, ligament, and fascia  . Critical lower limb ischemia  . Atherosclerotic PVD with ulceration  . Unilateral AKA  . Urinary tract infection after immobility  . Atherosclerosis of native arteries of the extremities with ulceration(440.23)  . Ejection fraction  . Cardiomyopathy, ischemic  . Hyperlipidemia  . Pulmonary hypertension  . Lung nodule  . Tobacco abuse  . Urinary retention  . Hemiparesis  . Hypertension  . S/P AKA (above knee amputation)  . Preop cardiovascular exam  . 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 Diabetes mellitus    ROS   Patient denies fever, chills, headache, sweats, rash, change in vision, change in hearing, chest pain, cough, nausea vomiting, urinary symptoms. All other systems are reviewed and are negative.  PHYSICAL  EXAM  Patient is oriented to person time and place. He is in his 3 wheeler. He's here with his wife. Affect is normal. Lungs are clear. Respiratory effort is nonlabored. Cardiac exam reveals S1 and S2. There no clicks or significant murmurs. The abdomen is soft. He is bilateral amputations.  Filed Vitals:   06/11/12 1049  BP: 128/76  Pulse: 64  Height: 5\' 11"  (1.803 m)  Weight: 153 lb (69.4 kg)     ASSESSMENT & PLAN

## 2012-06-11 NOTE — Assessment & Plan Note (Signed)
Prior to the time of surgery we had done testing to be sure that he was stable. He did not have ischemia. However both his nuclear study and his echo revealed that he had significant drop in his ejection fraction.. I have chosen not to titrate his medicines before he had surgery for his second amputation. It is now time to begin to adjust his medicines very carefully. Will have to be very careful with this. He has decreased body mass having bilateral amputations. He is on a small dose of a beta blocker. His renal function has been stable and he is blood pressure is stable. I will start a small dose of an ACE inhibitor today. We will then see him back for followup.

## 2012-06-11 NOTE — Assessment & Plan Note (Signed)
Coronary disease is stable. No further workup at this time. 

## 2012-06-24 ENCOUNTER — Ambulatory Visit (INDEPENDENT_AMBULATORY_CARE_PROVIDER_SITE_OTHER): Payer: Medicare Other | Admitting: Internal Medicine

## 2012-06-24 ENCOUNTER — Encounter: Payer: Self-pay | Admitting: Internal Medicine

## 2012-06-24 ENCOUNTER — Telehealth: Payer: Self-pay | Admitting: *Deleted

## 2012-06-24 VITALS — BP 100/62 | HR 69 | Temp 97.9°F

## 2012-06-24 DIAGNOSIS — I2589 Other forms of chronic ischemic heart disease: Secondary | ICD-10-CM | POA: Diagnosis not present

## 2012-06-24 DIAGNOSIS — G546 Phantom limb syndrome with pain: Secondary | ICD-10-CM | POA: Insufficient documentation

## 2012-06-24 DIAGNOSIS — I10 Essential (primary) hypertension: Secondary | ICD-10-CM

## 2012-06-24 DIAGNOSIS — I255 Ischemic cardiomyopathy: Secondary | ICD-10-CM

## 2012-06-24 DIAGNOSIS — G547 Phantom limb syndrome without pain: Secondary | ICD-10-CM | POA: Diagnosis not present

## 2012-06-24 NOTE — Progress Notes (Signed)
  Subjective:    Patient ID: Jaime [name], male    DOB: 29-Apr-1941, 71 y.o.   MRN: [id number]  HPI ROV Since the last time he was here he had a right AKA. --Still has phantom pain, pain goes from the "right foot up to a whole leg" wife reports that sometimes he complains of pain even at the right abdomen but the patient denies it. Current treatment for pain is Vicodin on average 3 times a day, meloxicam (without apparent side effects), recently restarted gabapentin and it does seems to help temporarily. --He was found to have cardiomyopathy, saw cardiology 06/11/2012, no medication change was suggested. --High cholesterol, good medication compliance.  Past Medical History:   MS   DM   Hyperlipidemia Pulmonary hypertension moderate.... echo.... January, 2008   Ischemic cardiomyopathy   CAD non-STEMI.. 2007.Marland Kitchen occluded circumflex... Taxus stent placed.... residual 80% LAD.Marland Kitchen 50% RCA   Peripheral vascular disease  BPH   Lung nodule-- resolved per 11-2006 CT chest    Past Surgical History:   TURP, Dr Nessi---> 07-30-11   hernia repair (R)   Above K amputation L 02-2012 d/t PVD  Social History:   Married, children x 2   As of 03-2012 he gets physical therapy for home, help w/ bath and nurse visits Current Smoker, quit 02-2012, occ E-cigarret   Alcohol use-no    Review of Systems Denies chest pain or shortness of breath. Denies nausea, vomiting, change in the color of the stools. Good appetite. No epigastric pain. Denies any skin breakdowns at the present time. Occasionally feels down but no depression per se Most recent labs reviewed, does not need any specific labs repeated today    Objective:   Physical Exam General -- alert, well-developed  Lungs -- normal respiratory effort, no intercostal retractions, no accessory muscle use, and normal breath sounds.   Heart-- normal rate, regular rhythm, no murmur, and no gallop.   Abdomen--soft, non-tender, no distention, no masses, no  HSM, no guarding, and no rigidity.   Extremities-- s/p B AKA  Psych-- Cognition and judgment appear intact. Alert and cooperative with normal attention span and concentration.  not anxious appearing and not depressed appearing. She seems to be in good spirits       Assessment & Plan:  Recommend a flu shot, states can not handle, "got very sick w/ it"

## 2012-06-24 NOTE — Assessment & Plan Note (Signed)
BP slightly today, see instructions

## 2012-06-24 NOTE — Telephone Encounter (Signed)
Jaime [name] with [name] left a msg stating she saw the pt this morning & his heart rate was low & irregular. It was between 40-48. [name] stated the cardiologist increased the pt's metoprolol last week. She said he was showing no signs of distress. The pt is coming in this morning @ 11am for an appt.

## 2012-06-24 NOTE — Telephone Encounter (Signed)
Pulse today was 69 and it seem regular

## 2012-06-24 NOTE — Assessment & Plan Note (Signed)
carefully followup by cardiology

## 2012-06-24 NOTE — Assessment & Plan Note (Addendum)
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 Current treatment is gabapentin 800 mg 3 times a day (up from 300 tid, increase in dose did help), Vicodin 3 times a day  And meloxicam daily; no apparent GI side effects Plan: Increase gabapentin to 800 qid, a high dose, recommend not to stop abruptly

## 2012-06-24 NOTE — Patient Instructions (Signed)
Check the  blood pressure 2 or 3 times a week, be sure it is between 110/60 and 140/85. If it is consistently higher or lower, let me know  

## 2012-06-29 ENCOUNTER — Other Ambulatory Visit: Payer: Self-pay | Admitting: Physician Assistant

## 2012-07-02 ENCOUNTER — Encounter: Payer: Self-pay | Admitting: Vascular Surgery

## 2012-07-03 ENCOUNTER — Ambulatory Visit (INDEPENDENT_AMBULATORY_CARE_PROVIDER_SITE_OTHER): Payer: Medicare Other | Admitting: Vascular Surgery

## 2012-07-03 ENCOUNTER — Encounter: Payer: Self-pay | Admitting: Vascular Surgery

## 2012-07-03 VITALS — BP 122/62 | HR 40 | Ht 71.0 in | Wt 153.0 lb

## 2012-07-03 DIAGNOSIS — I739 Peripheral vascular disease, unspecified: Secondary | ICD-10-CM

## 2012-07-03 DIAGNOSIS — I999 Unspecified disorder of circulatory system: Secondary | ICD-10-CM

## 2012-07-03 DIAGNOSIS — I998 Other disorder of circulatory system: Secondary | ICD-10-CM

## 2012-07-03 NOTE — Progress Notes (Signed)
[facility]  Postoperative Visit  History of Present Illness  TRAIVON MORRICAL is a 71 y.o. male who presents for postoperative follow-up for: right above-the-knee amputation (Date: 06/03/12).  The patient's wounds are healed.  The patient notes pain is well controlled.  The patient's current symptoms are: none.  Physical Examination  Filed Vitals:   07/03/12 1114  BP: 122/62  Pulse: 40   RLE: Incision is healed.  Staples are intact.  Medical Decision Making  KENI ELISON is a 71 y.o. male who presents s/p right above-the-knee ampuation.  The patient's stump is healing appropriately with resolution of pre-operative symptoms. I discussed in depth with the patient the nature of atherosclerosis, and emphasized the importance of maximal medical management including strict control of blood pressure, blood glucose, and lipid levels, obtaining regular exercise, and cessation of smoking.  The patient is aware that without maximal medical management the underlying atherosclerotic disease process will progress, possibly leading to a more proximal amputation. The patient agrees to participate in their maximal medical care.  Thank you for allowing [place] to participate in this patient's care.  The patient can follow up with [place] as needed.  Leonides Sake, MD [facility] [place] Office: [phone] Pager: [phone]

## 2012-07-10 ENCOUNTER — Emergency Department (HOSPITAL_COMMUNITY)
Admission: EM | Admit: 2012-07-10 | Discharge: 2012-07-10 | Disposition: A | Discharge status: Home / Self Care | Payer: Medicare Other | Attending: Emergency Medicine | Admitting: Emergency Medicine

## 2012-07-10 ENCOUNTER — Emergency Department (HOSPITAL_COMMUNITY): Payer: Medicare Other

## 2012-07-10 ENCOUNTER — Encounter (HOSPITAL_COMMUNITY): Payer: Self-pay | Admitting: Emergency Medicine

## 2012-07-10 DIAGNOSIS — Z8701 Personal history of pneumonia (recurrent): Secondary | ICD-10-CM | POA: Diagnosis not present

## 2012-07-10 DIAGNOSIS — Z872 Personal history of diseases of the skin and subcutaneous tissue: Secondary | ICD-10-CM | POA: Diagnosis not present

## 2012-07-10 DIAGNOSIS — E785 Hyperlipidemia, unspecified: Secondary | ICD-10-CM | POA: Insufficient documentation

## 2012-07-10 DIAGNOSIS — F17200 Nicotine dependence, unspecified, uncomplicated: Secondary | ICD-10-CM | POA: Insufficient documentation

## 2012-07-10 DIAGNOSIS — R109 Unspecified abdominal pain: Secondary | ICD-10-CM | POA: Diagnosis not present

## 2012-07-10 DIAGNOSIS — Z860100 Personal history of colon polyps, unspecified: Secondary | ICD-10-CM | POA: Insufficient documentation

## 2012-07-10 DIAGNOSIS — I10 Essential (primary) hypertension: Secondary | ICD-10-CM | POA: Insufficient documentation

## 2012-07-10 DIAGNOSIS — Z8669 Personal history of other diseases of the nervous system and sense organs: Secondary | ICD-10-CM | POA: Diagnosis not present

## 2012-07-10 DIAGNOSIS — I252 Old myocardial infarction: Secondary | ICD-10-CM | POA: Insufficient documentation

## 2012-07-10 DIAGNOSIS — Z794 Long term (current) use of insulin: Secondary | ICD-10-CM | POA: Insufficient documentation

## 2012-07-10 DIAGNOSIS — E119 Type 2 diabetes mellitus without complications: Secondary | ICD-10-CM | POA: Insufficient documentation

## 2012-07-10 DIAGNOSIS — Z8673 Personal history of transient ischemic attack (TIA), and cerebral infarction without residual deficits: Secondary | ICD-10-CM | POA: Diagnosis not present

## 2012-07-10 DIAGNOSIS — G4700 Insomnia, unspecified: Secondary | ICD-10-CM | POA: Insufficient documentation

## 2012-07-10 DIAGNOSIS — S78119A Complete traumatic amputation at level between unspecified hip and knee, initial encounter: Secondary | ICD-10-CM | POA: Insufficient documentation

## 2012-07-10 DIAGNOSIS — E860 Dehydration: Secondary | ICD-10-CM | POA: Diagnosis not present

## 2012-07-10 DIAGNOSIS — Z8739 Personal history of other diseases of the musculoskeletal system and connective tissue: Secondary | ICD-10-CM | POA: Insufficient documentation

## 2012-07-10 DIAGNOSIS — R197 Diarrhea, unspecified: Secondary | ICD-10-CM

## 2012-07-10 DIAGNOSIS — I2510 Atherosclerotic heart disease of native coronary artery without angina pectoris: Secondary | ICD-10-CM | POA: Diagnosis not present

## 2012-07-10 DIAGNOSIS — Z87440 Personal history of urinary (tract) infections: Secondary | ICD-10-CM | POA: Insufficient documentation

## 2012-07-10 DIAGNOSIS — Z79899 Other long term (current) drug therapy: Secondary | ICD-10-CM | POA: Diagnosis not present

## 2012-07-10 DIAGNOSIS — N529 Male erectile dysfunction, unspecified: Secondary | ICD-10-CM | POA: Insufficient documentation

## 2012-07-10 LAB — CBC WITH DIFFERENTIAL/PLATELET
Basophils Absolute: 0 10*3/uL (ref 0.0–0.1)
Eosinophils Relative: 5 % (ref 0–5)
Lymphocytes Relative: 38 % (ref 12–46)
MCV: 77.4 fL — ABNORMAL LOW (ref 78.0–100.0)
Neutrophils Relative %: 46 % (ref 43–77)
Platelets: 161 10*3/uL (ref 150–400)
RDW: 17.8 % — ABNORMAL HIGH (ref 11.5–15.5)
WBC: 5.3 10*3/uL (ref 4.0–10.5)

## 2012-07-10 LAB — URINALYSIS, ROUTINE W REFLEX MICROSCOPIC
Nitrite: NEGATIVE
Protein, ur: 30 mg/dL — AB
Urobilinogen, UA: 1 mg/dL (ref 0.0–1.0)

## 2012-07-10 LAB — BASIC METABOLIC PANEL
CO2: 21 mEq/L (ref 19–32)
Calcium: 8.9 mg/dL (ref 8.4–10.5)
GFR calc non Af Amer: >90 mL/min (ref >90)
Sodium: 140 mEq/L (ref 135–145)

## 2012-07-10 LAB — URINE MICROSCOPIC-ADD ON

## 2012-07-10 LAB — GLUCOSE, CAPILLARY: Glucose-Capillary: 134 mg/dL — ABNORMAL HIGH (ref 70–99)

## 2012-07-10 MED ORDER — SODIUM CHLORIDE 0.9 % IV BOLUS (SEPSIS)
500.0000 mL | Freq: Once | INTRAVENOUS | Status: AC
  Administered 2012-07-10: 500 mL via INTRAVENOUS

## 2012-07-10 MED ORDER — SODIUM CHLORIDE 0.9 % IV SOLN: INTRAVENOUS | Status: DC

## 2012-07-10 NOTE — ED Notes (Signed)
Per [facility].  Pt from home.  Pt has been having diarrhea and abd pain for past 3 days.  Has been drinking po fluids, but stated food hurt his stomach.  Denied n/v.  Denied any pain to ems at this time.  Pt has DM.

## 2012-07-10 NOTE — ED Provider Notes (Signed)
History     CSN: [id number]  Arrival date & time 07/10/12  1111   First MD Initiated Contact with Patient 07/10/12 1122      Chief Complaint  Patient presents with  . Diarrhea  . Abdominal Pain    (Consider location/radiation/quality/duration/timing/severity/associated sxs/prior treatment) HPI Comments: Jaime [name] is a 71 y.o. Male who presents for evaluation of diarrhea. He has intermittent abdominal cramping and persistent diarrhea for 3 days. He can tolerate oral fluids better than oral food. Food tends to cause increased stooling. He has felt warm, but not taken his temperature. He has had mild, nausea, but not vomiting. There are no known sick contacts, or suspected toxic food intake. No one else in his home is sick. He has not seen a doctor recently. He denies headache, dizziness, paresthesias, or back pain. There are no other modifying factors.  The history is provided by the patient.    Past Medical History  Diagnosis Date  . Hyperlipidemia   . Cardiomyopathy, ischemic     EF 45% per ECHO 2008  //   EF 25%, echo, August, 2013  . Pulmonary hypertension     moderate ECHO Jan 2008  . Systolic heart failure   . Multiple sclerosis DX 1993    NEUROLOGIST-  DR DOHMEIER -- LAST VISIT 11-20-2010  NOTE W/ CHART  . Lung nodule     resolved 11-2006 CT Chest  . Tobacco abuse   . Increased prostate specific antigen (PSA) velocity   . Urinary retention     dx ~ 2-12, like from MS, now with a catheter, saw urology  . Ejection fraction      EF 45%, echo, 2008  //   EF 25%, echo, August, 2013  . Cellulitis   . Pre-syncope     Near syncope, ER visit, October, 2011  . Carotid artery disease     Doppler, February, 2012, 0-39% bilateral,Mild smooth plaque  . Chronic indwelling foley catheter   . Scoliosis associated with other condition   . Hemiparesis   . Fatigue SEVERE  . Weakness   . Impotence   . MI, acute, non ST segment elevation 2007    S/P PCI WITH X1 STENT (TAXUS  DRUG-ELUTING) LEFT CIRCUMFLEX  . Echocardiogram abnormal 08-17-2006    SEVERE HYPOKINESIS OF INFERIOR , POSTEROR WALLS OF BASE AND MID VENTRICLE AND MOD. PULMONARY HTN  . Diabetes mellitus     INSULIN-DEPENDANT  . Hypertension   . H/O pleural effusion 2008    POST THORACENTESIS  . History of colon polyps PRECANCEROUS  . Insomnia   . Ingrown right big toenail CHRONIC    PER PT VERY SORE  . CAD (coronary artery disease) CARDIOLOGIST- DR KATZ-- VISIT 06-05-2011 IN [facility]    non-STEMI, 2007.Marland Kitchenoccluded circumflex.. Taxus stent placed...residual 80% LAD...50% RCA  . Pneumonia     hx of  . Urinary tract infection     hx of  . Atherosclerotic PVD with ulceration     left foot  . S/P AKA (above knee amputation)     left AKA August, 2013, after vascular surgery failed  . Preop cardiovascular exam     Cardiac clearance for possible right AKA, September, 2013  . Neuromuscular disorder     multiple sclerosis    Past Surgical History  Procedure Date  . Hernia repair 1990    (R)  . Thoracentesis 2008    PLEURAL EFFUSION  . Cystoscopy 07/30/2011    Procedure: CYSTOSCOPY;  Surgeon: Chana Bode  Nesi, MD;  Location: [facility];  Service: Urology;  Laterality: N/A;  . Transurethral resection of prostate 07/30/2011    Procedure: TRANSURETHRAL RESECTION OF THE PROSTATE (TURP);  Surgeon: Lindaann Slough, MD;  Location: [facility];  Service: Urology;  Laterality: N/A;  . Femoral-tibial bypass graft 03/11/2012    Procedure: BYPASS GRAFT FEMORAL-TIBIAL ARTERY;  Surgeon: Fransisco Hertz, MD;  Location: [facility] OR;  Service: Vascular;  Laterality: Left;  Left Femoral -Tibial trunk bypass, Endarterectomy of Tibial- Peroneal trunk with vein angioplasty.  . Intraoperative arteriogram 03/11/2012    Procedure: INTRA OPERATIVE ARTERIOGRAM;  Surgeon: Fransisco Hertz, MD;  Location: [facility] OR;  Service: Vascular;  Laterality: Left;  . Femoral-popliteal bypass graft 03/11/2012    Procedure: BYPASS GRAFT  FEMORAL-POPLITEAL ARTERY;  Surgeon: Fransisco Hertz, MD;  Location: [facility] OR;  Service: Vascular;  Laterality: Left;  embolectomy left lower leg  . Amputation 03/17/2012    Procedure: AMPUTATION ABOVE KNEE;  Surgeon: Fransisco Hertz, MD;  Location: [facility] OR;  Service: Vascular;  Laterality: Left;  . Coronary angioplasty with stent placement 05-01-2006    OCCLUDED CIRCUMFLEX -- TAXUS STENT PLACMENT  AND RESIDUAL 80% LAD,  50% RCA  . Amputation 06/03/2012    Procedure: AMPUTATION ABOVE KNEE;  Surgeon: Fransisco Hertz, MD;  Location: [facility] OR;  Service: Vascular;  Laterality: Right;  . Above knee leg amputation 06/03/2012    rt      Family History  Problem Relation Age of Onset  . Hypertension    . Heart attack Mother 53  . Heart disease Mother   . Stroke Mother   . Hyperlipidemia Mother   . Hypertension Mother   . Colon cancer Neg Hx   . Prostate cancer Neg Hx   . COPD Father   . Peripheral vascular disease Father   . Diabetes Brother   . Heart disease Brother   . Hypertension Brother   . Heart attack Brother   . Diabetes Daughter     History  Substance Use Topics  . Smoking status: Former Smoker -- 50 years  . Smokeless tobacco: Former Neurosurgeon    Quit date: 08/05/2009     Comment: pt states that he is using E-cigs  . Alcohol Use: No      Review of Systems  All other systems reviewed and are negative.    Allergies  Bee venom  Home Medications   Current Outpatient Rx  Name  Route  Sig  Dispense  Refill  . AMANTADINE HCL 100 MG PO CAPS   Oral   Take 1 capsule (100 mg total) by mouth 2 (two) times daily.   60 capsule   1   . ASPIRIN EC 325 MG PO TBEC   Oral   Take 325 mg by mouth daily.         . BD PEN NEEDLE SHORT U/F 31G X 8 MM MISC               . BACLOFEN 10 MG PO TABS   Oral   Take 5 mg by mouth 2 (two) times daily. Take 1/2 tablet         . BENAZEPRIL HCL 5 MG PO TABS   Oral   Take 1 tablet (5 mg total) by mouth daily.   30 tablet   6   . DICLOFENAC  SODIUM 1 % TD GEL   Topical   Apply 1 application topically 4 (four) times daily as needed.  For pain          . GABAPENTIN 800 MG PO TABS   Oral   Take 800 mg by mouth 4 (four) times daily.         Marland Kitchen HYDROCODONE-ACETAMINOPHEN 5-500 MG PO TABS   Oral   Take 1 tablet by mouth every 6 (six) hours as needed for pain.   40 tablet   0   . INSULIN ASPART PROT & ASPART (70-30) 100 UNIT/ML [place] SUSP   Subcutaneous   Inject 10-30 Units into the skin 2 (two) times daily with a meal. Take 30 units at breakfast and 10 units at supper         . MELOXICAM 15 MG PO TABS   Oral   Take 15 mg by mouth daily.          Marland Kitchen METOPROLOL SUCCINATE ER 25 MG PO TB24   Oral   Take 37.5 mg by mouth daily. Take 1.5 tablets (37.5 mg total) daily         . NORTRIPTYLINE HCL 10 MG PO CAPS   Oral   Take 1 capsule (10 mg total) by mouth at bedtime.   30 capsule   1   . PANTOPRAZOLE SODIUM 40 MG PO TBEC   Oral   Take 1 tablet (40 mg total) by mouth daily at 12 noon.   30 tablet   1   . SIMVASTATIN 40 MG PO TABS   Oral   Take 1 tablet (40 mg total) by mouth at bedtime.   90 tablet   1     BP 135/92  Pulse 61  Temp 97.9 F (36.6 C) (Oral)  Resp 16  SpO2 97%  Physical Exam  Nursing note and vitals reviewed. Constitutional: He is oriented to person, place, and time. He appears well-developed and well-nourished.  HENT:  Head: Normocephalic and atraumatic.  Right Ear: External ear normal.  Left Ear: External ear normal.  Eyes: Conjunctivae normal and EOM are normal. Pupils are equal, round, and reactive to light.  Neck: Normal range of motion and phonation normal. Neck supple. No thyromegaly present.  Cardiovascular: Normal rate, regular rhythm, normal heart sounds and intact distal pulses.   Pulmonary/Chest: Effort normal and breath sounds normal. He exhibits no bony tenderness.  Abdominal: Soft. Normal appearance and bowel sounds are normal. He exhibits no distension and no mass. There  is no tenderness. There is no guarding.  Musculoskeletal: Normal range of motion.       Bilateral AKA. Right thigh stump is healing, from recent surgery. Mild, global weakness, he is unable sit up on his own.  Neurological: He is alert and oriented to person, place, and time. He has normal strength. No cranial nerve deficit or sensory deficit. He exhibits normal muscle tone. Coordination normal.  Skin: Skin is warm, dry and intact.  Psychiatric: He has a normal mood and affect. His behavior is normal. Judgment and thought content normal.    ED Course  Procedures (including critical care time)  Emergency department treatment: IV fluid bolus, and drip. Imaging, labs, urine and stool samples requested.  Reevaluation: 15:40- he feels better. Repeat vital signs are reassuring.    Labs Reviewed  GLUCOSE, CAPILLARY - Abnormal; Notable for the following:    Glucose-Capillary 134 (*)     All other components within normal limits  URINALYSIS, ROUTINE W REFLEX MICROSCOPIC - Abnormal; Notable for the following:    Specific Gravity, Urine 1.041 (*)     Bilirubin  Urine SMALL (*)     Ketones, ur TRACE (*)     Protein, ur 30 (*)     Leukocytes, UA TRACE (*)     All other components within normal limits  CBC WITH DIFFERENTIAL - Abnormal; Notable for the following:    MCV 77.4 (*)     RDW 17.8 (*)     All other components within normal limits  BASIC METABOLIC PANEL - Abnormal; Notable for the following:    Glucose, Bld 133 (*)     Creatinine, Ser 0.43 (*)     All other components within normal limits  URINE MICROSCOPIC-ADD ON - Abnormal; Notable for the following:    Crystals CA OXALATE CRYSTALS (*)     All other components within normal limits  STOOL CULTURE  CLOSTRIDIUM DIFFICILE BY PCR  STOOL CULTURE  CLOSTRIDIUM DIFFICILE BY PCR   Dg Abd Acute W/chest  07/10/2012  *RADIOLOGY REPORT*  Clinical Data: Abdominal pain, diarrhea  ACUTE ABDOMEN SERIES (ABDOMEN 2 VIEW & CHEST 1 VIEW)   Comparison: 03/10/2012  Findings: Mild cardiomegaly with chronic interstitial changes. 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  No focal pneumonia, CHF pattern, effusion, pneumothorax.  Scoliosis of the spine evident.  Scattered air throughout the small and large bowel.  No definite obstruction pattern or ileus.  No free air on the decubitus view. Atherosclerosis of the aortic bifurcation and iliac vessels.  IMPRESSION: No acute chest finding.  Negative for obstruction pattern or free air   Original Report Authenticated By: Judie Petit. Shick, M.D.      1. Dehydration   2. Diarrhea       MDM  Nonspecific, diarrhea. Unable to collect stool sample in ED, because he did not produce any more stool. He appears dehydrated, based on urine, specific gravity. He feels better after IV fluids.Doubt metabolic instability, serious bacterial infection or impending vascular collapse; the patient is stable for discharge.     Plan: Home Medications- usual, plus Immodium prn; Home Treatments- push fluids; Recommended follow up- PCP in 1 week Will send home with stool kit to obtain sample and return to the lab.         Flint Melter, MD 07/10/12 [phone]

## 2012-07-10 NOTE — ED Notes (Signed)
GEX:BM84<XL> Expected date:07/10/12<BR> Expected time:11:04 AM<BR> Means of arrival:Ambulance<BR> Comments:<BR> Diarrhea x 3 days/61yoF

## 2012-07-14 ENCOUNTER — Other Ambulatory Visit: Payer: Medicare Other

## 2012-07-14 ENCOUNTER — Other Ambulatory Visit: Payer: Self-pay | Admitting: *Deleted

## 2012-07-14 DIAGNOSIS — R197 Diarrhea, unspecified: Secondary | ICD-10-CM | POA: Diagnosis not present

## 2012-07-15 LAB — FECAL LACTOFERRIN, QUANT: Lactoferrin: NEGATIVE

## 2012-07-16 ENCOUNTER — Ambulatory Visit: Payer: Medicare Other | Admitting: Cardiology

## 2012-07-16 ENCOUNTER — Ambulatory Visit (INDEPENDENT_AMBULATORY_CARE_PROVIDER_SITE_OTHER): Payer: Medicare Other | Admitting: Cardiology

## 2012-07-16 ENCOUNTER — Encounter: Payer: Self-pay | Admitting: Cardiology

## 2012-07-16 VITALS — BP 122/68 | HR 82 | Ht 71.0 in | Wt 153.0 lb

## 2012-07-16 DIAGNOSIS — I491 Atrial premature depolarization: Secondary | ICD-10-CM | POA: Diagnosis not present

## 2012-07-16 DIAGNOSIS — I2510 Atherosclerotic heart disease of native coronary artery without angina pectoris: Secondary | ICD-10-CM

## 2012-07-16 DIAGNOSIS — I2589 Other forms of chronic ischemic heart disease: Secondary | ICD-10-CM | POA: Diagnosis not present

## 2012-07-16 DIAGNOSIS — I255 Ischemic cardiomyopathy: Secondary | ICD-10-CM

## 2012-07-16 MED ORDER — BENAZEPRIL HCL 10 MG PO TABS: 10.0000 mg | ORAL_TABLET | Freq: Every day | ORAL | Status: DC

## 2012-07-16 NOTE — Assessment & Plan Note (Signed)
The patient is tolerating very low dose Lotensin. The dose will be increased to 10 mg.

## 2012-07-16 NOTE — Assessment & Plan Note (Signed)
The patient has scattered PACs on his rhythm. There was question recently that he may have had atrial fib as questioned by the EMS service taking him to the hospital. There was no atrial fib documented in the hospital. Rhythm today reveals sinus rhythm with PACs. No further workup at this time.

## 2012-07-16 NOTE — Assessment & Plan Note (Signed)
Coronary disease is stable. No change in therapy. 

## 2012-07-16 NOTE — Progress Notes (Signed)
HPI  Patient is seen today to followup his cardiomyopathy. Since seeing him last he was seen once in the emergency room for some dehydration. He was stabilized with volume. Despite his cardiomyopathy he does not have problems with volume overload. He is a bilateral amputee. When I saw him last I had a very low dose of Lotensin at 5 mg. He is tolerating this well.  I reviewed the hospital records.The patient says that when he was taking emergency room recently that EMS question the possibility of atrial fibrillation. There is no mention of this in the emergency room record. There was no EKG in the emergency room.  Allergies  Allergen Reactions  . Bee Venom Anaphylaxis    Current Outpatient Prescriptions  Medication Sig Dispense Refill  . amantadine (SYMMETREL) 100 MG capsule Take 1 capsule (100 mg total) by mouth 2 (two) times daily.  60 capsule  1  . aspirin EC 325 MG tablet Take 325 mg by mouth daily.      . B-D ULTRAFINE III SHORT PEN 31G X 8 MM MISC       . baclofen (LIORESAL) 10 MG tablet Take 5 mg by mouth 2 (two) times daily. Take 1/2 tablet      . benazepril (LOTENSIN) 5 MG tablet Take 1 tablet (5 mg total) by mouth daily.  30 tablet  6  . diclofenac sodium (VOLTAREN) 1 % GEL Apply 1 application topically 4 (four) times daily as needed. For pain       . gabapentin (NEURONTIN) 800 MG tablet Take 800 mg by mouth 4 (four) times daily.      Marland Kitchen HYDROcodone-acetaminophen (VICODIN) 5-500 MG per tablet Take 1 tablet by mouth every 6 (six) hours as needed for pain.  40 tablet  0  . insulin aspart protamine-insulin aspart (NOVOLOG 70/30) (70-30) 100 UNIT/ML injection Inject 10-30 Units into the skin 2 (two) times daily with a meal. Take 30 units at breakfast and 10 units at supper      . meloxicam (MOBIC) 15 MG tablet Take 15 mg by mouth daily.       . metoprolol succinate (TOPROL-XL) 25 MG 24 hr tablet Take 37.5 mg by mouth daily. Take 1.5 tablets (37.5 mg total) daily      . nortriptyline  (PAMELOR) 10 MG capsule Take 1 capsule (10 mg total) by mouth at bedtime.  30 capsule  1  . pantoprazole (PROTONIX) 40 MG tablet Take 1 tablet (40 mg total) by mouth daily at 12 noon.  30 tablet  1  . simvastatin (ZOCOR) 40 MG tablet Take 1 tablet (40 mg total) by mouth at bedtime.  90 tablet  1    History   Social History  . Marital Status: Married    Spouse Name: N/A    Number of Children: 2  . Years of Education: N/A   Occupational History  . disable    Social History Main Topics  . Smoking status: Former Smoker -- 50 years  . Smokeless tobacco: Former Neurosurgeon    Quit date: 08/05/2009     Comment: pt states that he is using E-cigs  . Alcohol Use: No  . Drug Use: No  . Sexually Active: Not on file     Comment: electronic cigarettes   Other Topics Concern  . Not on file   Social History Narrative   Lives w/ wife    Family History  Problem Relation Age of Onset  . Hypertension    . Heart  attack Mother 47  . Heart disease Mother   . Stroke Mother   . Hyperlipidemia Mother   . Hypertension Mother   . Colon cancer Neg Hx   . Prostate cancer Neg Hx   . COPD Father   . Peripheral vascular disease Father   . Diabetes Brother   . Heart disease Brother   . Hypertension Brother   . Heart attack Brother   . Diabetes Daughter     Past Medical History  Diagnosis Date  . Hyperlipidemia   . Cardiomyopathy, ischemic     EF 45% per ECHO 2008  //   EF 25%, echo, August, 2013  . Pulmonary hypertension     moderate ECHO Jan 2008  . Systolic heart failure   . Multiple sclerosis DX 1993    NEUROLOGIST-  DR DOHMEIER -- LAST VISIT 11-20-2010  NOTE W/ CHART  . Lung nodule     resolved 11-2006 CT Chest  . Tobacco abuse   . Increased prostate specific antigen (PSA) velocity   . Urinary retention     dx ~ 2-12, like from MS, now with a catheter, saw urology  . Ejection fraction      EF 45%, echo, 2008  //   EF 25%, echo, August, 2013  . Cellulitis   . Pre-syncope     Near  syncope, ER visit, October, 2011  . Carotid artery disease     Doppler, February, 2012, 0-39% bilateral,Mild smooth plaque  . Chronic indwelling foley catheter   . Scoliosis associated with other condition   . Hemiparesis   . Fatigue SEVERE  . Weakness   . Impotence   . MI, acute, non ST segment elevation 2007    S/P PCI WITH X1 STENT (TAXUS DRUG-ELUTING) LEFT CIRCUMFLEX  . Echocardiogram abnormal 08-17-2006    SEVERE HYPOKINESIS OF INFERIOR , POSTEROR WALLS OF BASE AND MID VENTRICLE AND MOD. PULMONARY HTN  . Diabetes mellitus     INSULIN-DEPENDANT  . Hypertension   . H/O pleural effusion 2008    POST THORACENTESIS  . History of colon polyps PRECANCEROUS  . Insomnia   . Ingrown right big toenail CHRONIC    PER PT VERY SORE  . CAD (coronary artery disease) CARDIOLOGIST- DR KATZ-- VISIT 06-05-2011 IN [facility]    non-STEMI, 2007.Marland Kitchenoccluded circumflex.. Taxus stent placed...residual 80% LAD...50% RCA  . Pneumonia     hx of  . Urinary tract infection     hx of  . Atherosclerotic PVD with ulceration     left foot  . S/P AKA (above knee amputation)     left AKA August, 2013, after vascular surgery failed  . Preop cardiovascular exam     Cardiac clearance for possible right AKA, September, 2013  . Neuromuscular disorder     multiple sclerosis    Past Surgical History  Procedure Date  . Hernia repair 1990    (R)  . Thoracentesis 2008    PLEURAL EFFUSION  . Cystoscopy 07/30/2011    Procedure: CYSTOSCOPY;  Surgeon: Lindaann Slough, MD;  Location: [facility];  Service: Urology;  Laterality: N/A;  . Transurethral resection of prostate 07/30/2011    Procedure: TRANSURETHRAL RESECTION OF THE PROSTATE (TURP);  Surgeon: Lindaann Slough, MD;  Location: [facility];  Service: Urology;  Laterality: N/A;  . Femoral-tibial bypass graft 03/11/2012    Procedure: BYPASS GRAFT FEMORAL-TIBIAL ARTERY;  Surgeon: Fransisco Hertz, MD;  Location: [facility] OR;  Service: Vascular;   Laterality: Left;  Left Femoral -Tibial trunk bypass, Endarterectomy of Tibial- Peroneal trunk with vein angioplasty.  . Intraoperative arteriogram 03/11/2012    Procedure: INTRA OPERATIVE ARTERIOGRAM;  Surgeon: Fransisco Hertz, MD;  Location: [facility] OR;  Service: Vascular;  Laterality: Left;  . Femoral-popliteal bypass graft 03/11/2012    Procedure: BYPASS GRAFT FEMORAL-POPLITEAL ARTERY;  Surgeon: Fransisco Hertz, MD;  Location: [facility] OR;  Service: Vascular;  Laterality: Left;  embolectomy left lower leg  . Amputation 03/17/2012    Procedure: AMPUTATION ABOVE KNEE;  Surgeon: Fransisco Hertz, MD;  Location: [facility] OR;  Service: Vascular;  Laterality: Left;  . Coronary angioplasty with stent placement 05-01-2006    OCCLUDED CIRCUMFLEX -- TAXUS STENT PLACMENT  AND RESIDUAL 80% LAD,  50% RCA  . Amputation 06/03/2012    Procedure: AMPUTATION ABOVE KNEE;  Surgeon: Fransisco Hertz, MD;  Location: [facility] OR;  Service: Vascular;  Laterality: Right;  . Above knee leg amputation 06/03/2012    rt      Patient Active Problem List  Diagnosis  . DM  . HYPERLIPIDEMIA  . TOBACCO ABUSE  . Multiple sclerosis  . CHF  . UNSPECIFIED SYSTOLIC HEART FAILURE  . BPH (benign prostatic hyperplasia)  . SHOULDER PAIN  . CAD (coronary artery disease)  . Carotid artery disease  . Other disorder of muscle, ligament, and fascia  . Critical lower limb ischemia  . Atherosclerotic PVD with ulceration  . Urinary tract infection after immobility  . Atherosclerosis of native arteries of the extremities with ulceration(440.23)  . Ejection fraction  . Cardiomyopathy, ischemic  . Hyperlipidemia  . Pulmonary hypertension  . Lung nodule  . Tobacco abuse  . Urinary retention  . Hemiparesis  . Hypertension  . S/P AKA (above knee amputation)  . Preop cardiovascular exam  . 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 Diabetes mellitus  . Phantom pain  . Peripheral vascular disease, unspecified    ROS   Patient denies fever, chills,  headache, sweats, rash, change in vision, change in hearing, chest pain, cough, nausea vomiting, urinary symptoms. All other systems are reviewed and are negative.  PHYSICAL EXAM  Patient is oriented to person time and place. Affect is normal. He's here in a wheelchair. He is a bilateral amputee. He's here with his wife. There is no jugulovenous distention. Lungs reveal a few scattered rhonchi. Cardiac exam reveals S1-S2. There no clicks or significant murmurs. The abdomen is soft.    Filed Vitals:   07/16/12 1022  BP: 122/68  Pulse: 82  Height: 5\' 11"  (1.803 m)  Weight: 153 lb (69.4 kg)  SpO2: 96%   Because of the question of atrial fibrillation as noted by the patient, EKG was done today. He has PACs. The rhythm is sinus. There is no change in the QRS from prior EKGs.  ASSESSMENT & PLAN

## 2012-07-16 NOTE — Patient Instructions (Addendum)
Your physician recommends that you schedule a follow-up appointment in: February  Your physician has recommended you make the following change in your medication: INCREASE your LOTENSIN to 10mg  daily

## 2012-07-18 LAB — STOOL CULTURE

## 2012-07-21 ENCOUNTER — Encounter: Payer: Self-pay | Admitting: *Deleted

## 2012-07-21 ENCOUNTER — Ambulatory Visit (INDEPENDENT_AMBULATORY_CARE_PROVIDER_SITE_OTHER): Payer: Medicare Other | Admitting: Internal Medicine

## 2012-07-21 ENCOUNTER — Encounter: Payer: Self-pay | Admitting: Internal Medicine

## 2012-07-21 ENCOUNTER — Telehealth: Payer: Self-pay | Admitting: *Deleted

## 2012-07-21 VITALS — BP 134/84 | HR 64 | Temp 97.6°F

## 2012-07-21 DIAGNOSIS — R05 Cough: Secondary | ICD-10-CM

## 2012-07-21 DIAGNOSIS — IMO0001 Reserved for inherently not codable concepts without codable children: Secondary | ICD-10-CM

## 2012-07-21 DIAGNOSIS — R197 Diarrhea, unspecified: Secondary | ICD-10-CM

## 2012-07-21 DIAGNOSIS — I739 Peripheral vascular disease, unspecified: Secondary | ICD-10-CM

## 2012-07-21 DIAGNOSIS — R059 Cough, unspecified: Secondary | ICD-10-CM

## 2012-07-21 MED ORDER — HYDROCODONE-ACETAMINOPHEN 5-500 MG PO TABS: 1.0000 | ORAL_TABLET | Freq: Four times a day (QID) | ORAL | Status: DC | PRN

## 2012-07-21 MED ORDER — AZITHROMYCIN 250 MG PO TABS: ORAL_TABLET | ORAL | Status: DC

## 2012-07-21 NOTE — Telephone Encounter (Signed)
After the OV today pt stated that he would like to get prosthetics for both his legs & he would like to know the best way to go about getting this done. Please advise.

## 2012-07-21 NOTE — Telephone Encounter (Signed)
As fas a prosthetics likely will need to go through PT

## 2012-07-21 NOTE — Patient Instructions (Addendum)
Take the antibiotic as recommended. CPT twice a day We are asking respiratory therapy to make some recommendations. If you don't hear from them in one week, please let us know

## 2012-07-21 NOTE — Progress Notes (Signed)
  Subjective:    Patient ID: Jaime [name], male    DOB: 1941-01-24, 71 y.o.   MRN: [id number]  HPI ER followup Jaime [name] to the ER 07/10/2012 with a history of diarrhea for 3 days, x-rays of the abdomen negative, BMP and CBC normal. Was discharged home after IV fluids Subsequently, had stool studies done --->  normal  Past Medical History:   MS   DM   Hyperlipidemia Pulmonary hypertension moderate.... echo.... January, 2008   Ischemic cardiomyopathy   CAD non-STEMI.. 2007.Marland Kitchen occluded circumflex... Taxus stent placed.... residual 80% LAD.Marland Kitchen 50% RCA   Peripheral vascular disease  BPH   Lung nodule-- resolved per 11-2006 CT chest    Past Surgical History:   TURP, Dr Nessi---> 07-30-11   hernia repair (R)   Above K amputation L 02-2012 d/t PVD  Social History:   Married, children x 2   As of 03-2012   help w/ bath and nurse visits ([facility]) Current Smoker, quit 02-2012, occ E-cigarret   Alcohol use-no    Review of Systems Since he left the ER, GI symptoms resolved, stools normal, appetite is good, no nausea or vomiting. He  Complains today of "chest congestion" and rattling in the chest associated with cough x the last few weeks. He try Mucinex but he thinks it caused diarrhea so he stopped taking that. No fever chills, no sputum production. No actual shortness of breath or chest pain. No postnasal dripping    Objective:   Physical Exam General -- alert, NAD. Wheelchair bound, mobility is quite limited HEENT -- right TM obscured by wax, left TM normal; throat w/o redness, face symmetric and not tender to palpation Lungs -- normal distress, no wheezing, + large airway congestion with cough and few rhonchi. No crackles.  Heart-- normal rate, regular rhythm, no murmur, and no gallop.   Abdomen--soft, non-tender, no distention  Psych-- Cognition and judgment appear intact. Alert and cooperative with normal attention span and concentration.  not anxious appearing and not depressed  appearing.       Assessment & Plan:   Diarrhea, Recently seen in the ER with diarrhea, no asymptomatic. No further eval needed at this time.

## 2012-07-21 NOTE — Addendum Note (Signed)
Addended by: Verdie Shire on: 07/21/2012 03:49 PM   Modules accepted: Orders

## 2012-07-21 NOTE — Telephone Encounter (Signed)
Rx resent to pharmacy by fax.//AB/CMA

## 2012-07-21 NOTE — Assessment & Plan Note (Addendum)
Cough for several weeks, also noted some "chest congestion". On exam he does have some rhonchi. Recent x-ray at the ER showed no pneumonia. I believe symptoms are related to difficulty moving secretions, and atypical infection is also possible. Plan: Z-Pak I showed the patient's wife how to do CPT. Will also contact [facility] --> needs RT eval (CPT? Saline nebulizations? Suggestion ?) Unable to take Mucinex d/t  diarrhea

## 2012-07-27 ENCOUNTER — Ambulatory Visit: Payer: Medicare Other | Admitting: Cardiology

## 2012-07-30 DIAGNOSIS — Z4789 Encounter for other orthopedic aftercare: Secondary | ICD-10-CM | POA: Diagnosis not present

## 2012-07-30 DIAGNOSIS — G35 Multiple sclerosis: Secondary | ICD-10-CM | POA: Diagnosis not present

## 2012-07-30 DIAGNOSIS — I10 Essential (primary) hypertension: Secondary | ICD-10-CM | POA: Diagnosis not present

## 2012-07-30 DIAGNOSIS — I739 Peripheral vascular disease, unspecified: Secondary | ICD-10-CM | POA: Diagnosis not present

## 2012-07-30 DIAGNOSIS — E119 Type 2 diabetes mellitus without complications: Secondary | ICD-10-CM | POA: Diagnosis not present

## 2012-07-30 DIAGNOSIS — I2510 Atherosclerotic heart disease of native coronary artery without angina pectoris: Secondary | ICD-10-CM | POA: Diagnosis not present

## 2012-07-31 ENCOUNTER — Other Ambulatory Visit: Payer: Self-pay | Admitting: Physical Medicine & Rehabilitation

## 2012-07-31 DIAGNOSIS — Z4789 Encounter for other orthopedic aftercare: Secondary | ICD-10-CM | POA: Diagnosis not present

## 2012-07-31 DIAGNOSIS — I10 Essential (primary) hypertension: Secondary | ICD-10-CM | POA: Diagnosis not present

## 2012-07-31 DIAGNOSIS — I2510 Atherosclerotic heart disease of native coronary artery without angina pectoris: Secondary | ICD-10-CM | POA: Diagnosis not present

## 2012-07-31 DIAGNOSIS — E119 Type 2 diabetes mellitus without complications: Secondary | ICD-10-CM | POA: Diagnosis not present

## 2012-07-31 DIAGNOSIS — I739 Peripheral vascular disease, unspecified: Secondary | ICD-10-CM | POA: Diagnosis not present

## 2012-07-31 DIAGNOSIS — G35 Multiple sclerosis: Secondary | ICD-10-CM | POA: Diagnosis not present

## 2012-08-03 ENCOUNTER — Encounter: Payer: Medicare Other | Attending: Physical Medicine & Rehabilitation

## 2012-08-03 ENCOUNTER — Encounter: Payer: Self-pay | Admitting: Physical Medicine & Rehabilitation

## 2012-08-03 ENCOUNTER — Other Ambulatory Visit: Payer: Self-pay

## 2012-08-03 ENCOUNTER — Ambulatory Visit (HOSPITAL_BASED_OUTPATIENT_CLINIC_OR_DEPARTMENT_OTHER): Payer: Medicare Other | Admitting: Physical Medicine & Rehabilitation

## 2012-08-03 VITALS — BP 122/79 | HR 72 | Resp 14 | Ht 71.0 in | Wt 153.0 lb

## 2012-08-03 DIAGNOSIS — E119 Type 2 diabetes mellitus without complications: Secondary | ICD-10-CM | POA: Diagnosis not present

## 2012-08-03 DIAGNOSIS — Z89611 Acquired absence of right leg above knee: Secondary | ICD-10-CM | POA: Insufficient documentation

## 2012-08-03 DIAGNOSIS — S78119A Complete traumatic amputation at level between unspecified hip and knee, initial encounter: Secondary | ICD-10-CM

## 2012-08-03 DIAGNOSIS — M79609 Pain in unspecified limb: Secondary | ICD-10-CM | POA: Insufficient documentation

## 2012-08-03 DIAGNOSIS — IMO0001 Reserved for inherently not codable concepts without codable children: Secondary | ICD-10-CM

## 2012-08-03 DIAGNOSIS — M25579 Pain in unspecified ankle and joints of unspecified foot: Secondary | ICD-10-CM | POA: Diagnosis not present

## 2012-08-03 DIAGNOSIS — G35 Multiple sclerosis: Secondary | ICD-10-CM | POA: Diagnosis not present

## 2012-08-03 DIAGNOSIS — G8220 Paraplegia, unspecified: Secondary | ICD-10-CM | POA: Diagnosis not present

## 2012-08-03 DIAGNOSIS — I10 Essential (primary) hypertension: Secondary | ICD-10-CM | POA: Diagnosis not present

## 2012-08-03 DIAGNOSIS — Z4789 Encounter for other orthopedic aftercare: Secondary | ICD-10-CM | POA: Diagnosis not present

## 2012-08-03 DIAGNOSIS — I739 Peripheral vascular disease, unspecified: Secondary | ICD-10-CM | POA: Diagnosis not present

## 2012-08-03 DIAGNOSIS — M21549 Acquired clubfoot, unspecified foot: Secondary | ICD-10-CM | POA: Insufficient documentation

## 2012-08-03 DIAGNOSIS — I2510 Atherosclerotic heart disease of native coronary artery without angina pectoris: Secondary | ICD-10-CM | POA: Diagnosis not present

## 2012-08-03 MED ORDER — HYDROCODONE-ACETAMINOPHEN 5-500 MG PO TABS: 1.0000 | ORAL_TABLET | Freq: Four times a day (QID) | ORAL | Status: DC | PRN

## 2012-08-03 MED ORDER — NORTRIPTYLINE HCL 10 MG PO CAPS: 10.0000 mg | ORAL_CAPSULE | Freq: Every day | ORAL | Status: DC

## 2012-08-03 MED ORDER — MELOXICAM 15 MG PO TABS: 15.0000 mg | ORAL_TABLET | Freq: Every day | ORAL | Status: DC

## 2012-08-03 NOTE — Progress Notes (Signed)
Subjective:    Patient ID: Jaime [name], male    DOB: 03/16/1941, 72 y.o.   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 General activity 0 Relation with others 0 Enjoyment of life 0 What TIME of day is your pain at its worst? morning Sleep (in general) Fair  Pain is worse with: some activites Pain improves with: rest and medication Relief from Meds: 4  Mobility use a wheelchair needs help with transfers  Function retired I need assistance with the following:  dressing, bathing, toileting, meal prep, household duties and shopping  Neuro/Psych bladder control problems bowel control problems weakness  Prior Studies Any changes since last visit?  no  Physicians involved in your care Any changes since last visit?  no   Family History  Problem Relation Age of Onset  . Hypertension    . Heart attack Mother 58  . Heart disease Mother   . Stroke Mother   . Hyperlipidemia Mother   . Hypertension Mother   . Colon cancer Neg Hx   . Prostate cancer Neg Hx   . COPD Father   . Peripheral vascular disease Father   . Diabetes Brother   . Heart disease Brother   . Hypertension Brother   . Heart attack Brother   . Diabetes Daughter    History   Social History  . Marital Status: Married    Spouse Name: N/A    Number of Children: 2  . Years of Education: N/A   Occupational History  . disable    Social History Main Topics  . Smoking status: Former Smoker -- 50 years  . Smokeless tobacco: Former Neurosurgeon    Quit date: 08/05/2009     Comment: pt states that he is using E-cigs  . Alcohol Use: No  . Drug Use:  No  . Sexually Active: None     Comment: electronic cigarettes   Other Topics Concern  . None   Social History Narrative   Lives w/ wife   Past Surgical History  Procedure Date  . Hernia repair 1990    (R)  . Thoracentesis 2008    PLEURAL EFFUSION  . Cystoscopy 07/30/2011    Procedure: CYSTOSCOPY;  Surgeon: Lindaann Slough, MD;  Location: [facility];  Service: Urology;  Laterality: N/A;  . Transurethral resection of prostate 07/30/2011    Procedure: TRANSURETHRAL RESECTION OF THE PROSTATE (TURP);  Surgeon: Lindaann Slough, MD;  Location: [facility];  Service: Urology;  Laterality: N/A;  . Femoral-tibial bypass graft 03/11/2012    Procedure: BYPASS GRAFT FEMORAL-TIBIAL ARTERY;  Surgeon: Fransisco Hertz, MD;  Location: [facility] OR;  Service: Vascular;  Laterality: Left;  Left Femoral -Tibial trunk bypass, Endarterectomy of Tibial- Peroneal trunk with vein angioplasty.  . Intraoperative arteriogram 03/11/2012    Procedure: INTRA OPERATIVE ARTERIOGRAM;  Surgeon: Fransisco Hertz, MD;  Location: [facility] OR;  Service: Vascular;  Laterality: Left;  . Femoral-popliteal bypass graft 03/11/2012    Procedure: BYPASS GRAFT FEMORAL-POPLITEAL ARTERY;  Surgeon: Fransisco Hertz, MD;  Location: [facility] OR;  Service: Vascular;  Laterality: Left;  embolectomy left lower leg  .  Amputation 03/17/2012    Procedure: AMPUTATION ABOVE KNEE;  Surgeon: Fransisco Hertz, MD;  Location: [facility] OR;  Service: Vascular;  Laterality: Left;  . Coronary angioplasty with stent placement 05-01-2006    OCCLUDED CIRCUMFLEX -- TAXUS STENT PLACMENT  AND RESIDUAL 80% LAD,  50% RCA  . Amputation 06/03/2012    Procedure: AMPUTATION ABOVE KNEE;  Surgeon: Fransisco Hertz, MD;  Location: [facility] OR;  Service: Vascular;  Laterality: Right;  . 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 Hyperlipidemia   . Cardiomyopathy, ischemic     EF 45% per ECHO 2008  //   EF 25%, echo, August, 2013  . Pulmonary  hypertension     moderate ECHO Jan 2008  . Systolic heart failure   . Multiple sclerosis DX 1993    NEUROLOGIST-  DR DOHMEIER -- LAST VISIT 11-20-2010  NOTE W/ CHART  . Lung nodule     resolved 11-2006 CT Chest  . Tobacco abuse   . Increased prostate specific antigen (PSA) velocity   . Urinary retention     dx ~ 2-12, like from MS, now with a catheter, saw urology  . Ejection fraction      EF 45%, echo, 2008  //   EF 25%, echo, August, 2013  . Cellulitis   . Pre-syncope     Near syncope, ER visit, October, 2011  . Carotid artery disease     Doppler, February, 2012, 0-39% bilateral,Mild smooth plaque  . Chronic indwelling foley catheter   . Scoliosis associated with other condition   . Hemiparesis   . Fatigue SEVERE  . Weakness   . Impotence   . MI, acute, non ST segment elevation 2007    S/P PCI WITH X1 STENT (TAXUS DRUG-ELUTING) LEFT CIRCUMFLEX  . Echocardiogram abnormal 08-17-2006    SEVERE HYPOKINESIS OF INFERIOR , POSTEROR WALLS OF BASE AND MID VENTRICLE AND MOD. PULMONARY HTN  . Diabetes mellitus     INSULIN-DEPENDANT  . Hypertension   . H/O pleural effusion 2008    POST THORACENTESIS  . History of colon polyps PRECANCEROUS  . Insomnia   . Ingrown right big toenail CHRONIC    PER PT VERY SORE  . CAD (coronary artery disease) CARDIOLOGIST- DR KATZ-- VISIT 06-05-2011 IN [facility]    non-STEMI, 2007.Marland Kitchenoccluded circumflex.. Taxus stent placed...residual 80% LAD...50% RCA  . Pneumonia     hx of  . Urinary tract infection     hx of  . Atherosclerotic PVD with ulceration     left foot  . S/P AKA (above knee amputation)     left AKA August, 2013, after vascular surgery failed  . Preop cardiovascular exam     Cardiac clearance for possible right AKA, September, 2013  . Neuromuscular disorder     multiple sclerosis  . PAC (premature atrial contraction)     December, 2013   BP 122/79  Pulse 72  Resp 14  Ht 5\' 11"  (1.803 m)  Wt 153 lb (69.4 kg)  BMI 21.34 kg/m2  SpO2  97%   Review of Systems  Constitutional: Positive for unexpected weight change.  Gastrointestinal:       Bowel control  Genitourinary:       Bladder control  Neurological: Positive for weakness.       Phantom leg pain  All other systems reviewed and are negative.       Objective:   Physical Exam  Constitutional: He is oriented to person, place, and time.  Musculoskeletal:       Right hip: He exhibits deformity.       Left hip: He exhibits deformity.       Bilateral AKA  Neurological: He is alert and oriented to person, place, and time. Coordination and gait abnormal.       4/5 in BUE delt bi tri grip Mild ataxia FNF BUE   Scoliotic deformity throughout the lumbar spine       Assessment & Plan:  1.  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  Scoliosis with reduced sitting tolerance pursuing ROHO cushion

## 2012-08-05 ENCOUNTER — Other Ambulatory Visit: Payer: Self-pay | Admitting: Internal Medicine

## 2012-08-05 ENCOUNTER — Other Ambulatory Visit: Payer: Self-pay | Admitting: *Deleted

## 2012-08-05 DIAGNOSIS — I2510 Atherosclerotic heart disease of native coronary artery without angina pectoris: Secondary | ICD-10-CM | POA: Diagnosis not present

## 2012-08-05 DIAGNOSIS — I739 Peripheral vascular disease, unspecified: Secondary | ICD-10-CM | POA: Diagnosis not present

## 2012-08-05 DIAGNOSIS — Z4789 Encounter for other orthopedic aftercare: Secondary | ICD-10-CM | POA: Diagnosis not present

## 2012-08-05 DIAGNOSIS — G35 Multiple sclerosis: Secondary | ICD-10-CM | POA: Diagnosis not present

## 2012-08-05 DIAGNOSIS — E119 Type 2 diabetes mellitus without complications: Secondary | ICD-10-CM | POA: Diagnosis not present

## 2012-08-05 DIAGNOSIS — I10 Essential (primary) hypertension: Secondary | ICD-10-CM | POA: Diagnosis not present

## 2012-08-05 NOTE — Telephone Encounter (Signed)
Okay Protonix x 6 months. Amantadine per neurology

## 2012-08-05 NOTE — Telephone Encounter (Signed)
I do not see where you have prescribed these 2 meds. Ok to refill?

## 2012-08-06 ENCOUNTER — Telehealth: Payer: Self-pay | Admitting: *Deleted

## 2012-08-06 ENCOUNTER — Other Ambulatory Visit: Payer: Self-pay | Admitting: Internal Medicine

## 2012-08-06 NOTE — Telephone Encounter (Signed)
Okay to refill x6

## 2012-08-06 NOTE — Telephone Encounter (Signed)
i do not see where you have prescribed this med. OK to refill?

## 2012-08-06 NOTE — Telephone Encounter (Signed)
Received call from [place] at [facility]. They need to see if patients Hydrocodone dose can be changed? No longer making it with Tylenol 500mg . Please advise.

## 2012-08-06 NOTE — Telephone Encounter (Signed)
Refill done.  

## 2012-08-07 DIAGNOSIS — E119 Type 2 diabetes mellitus without complications: Secondary | ICD-10-CM | POA: Diagnosis not present

## 2012-08-07 DIAGNOSIS — G35 Multiple sclerosis: Secondary | ICD-10-CM | POA: Diagnosis not present

## 2012-08-07 DIAGNOSIS — I739 Peripheral vascular disease, unspecified: Secondary | ICD-10-CM | POA: Diagnosis not present

## 2012-08-07 DIAGNOSIS — I10 Essential (primary) hypertension: Secondary | ICD-10-CM | POA: Diagnosis not present

## 2012-08-07 DIAGNOSIS — Z4789 Encounter for other orthopedic aftercare: Secondary | ICD-10-CM | POA: Diagnosis not present

## 2012-08-07 DIAGNOSIS — I2510 Atherosclerotic heart disease of native coronary artery without angina pectoris: Secondary | ICD-10-CM | POA: Diagnosis not present

## 2012-08-10 ENCOUNTER — Telehealth: Payer: Self-pay | Admitting: Internal Medicine

## 2012-08-10 DIAGNOSIS — I739 Peripheral vascular disease, unspecified: Secondary | ICD-10-CM | POA: Diagnosis not present

## 2012-08-10 DIAGNOSIS — Z4789 Encounter for other orthopedic aftercare: Secondary | ICD-10-CM | POA: Diagnosis not present

## 2012-08-10 DIAGNOSIS — E119 Type 2 diabetes mellitus without complications: Secondary | ICD-10-CM | POA: Diagnosis not present

## 2012-08-10 DIAGNOSIS — I2510 Atherosclerotic heart disease of native coronary artery without angina pectoris: Secondary | ICD-10-CM | POA: Diagnosis not present

## 2012-08-10 DIAGNOSIS — I10 Essential (primary) hypertension: Secondary | ICD-10-CM | POA: Diagnosis not present

## 2012-08-10 DIAGNOSIS — G35 Multiple sclerosis: Secondary | ICD-10-CM | POA: Diagnosis not present

## 2012-08-10 MED ORDER — HYDROCODONE-ACETAMINOPHEN 5-325 MG PO TABS: 1.0000 | ORAL_TABLET | Freq: Four times a day (QID) | ORAL | Status: DC | PRN

## 2012-08-10 NOTE — Telephone Encounter (Signed)
May change Tylenol dose to 3 or 25 mg

## 2012-08-10 NOTE — Telephone Encounter (Signed)
Spoke to Pt's wife states, she stated that the pt does not have any fever or chills. She stated that RN is coming first thing in the morning to see the pt & she would like to wait until she gets there before taking him to the ER.

## 2012-08-10 NOTE — Telephone Encounter (Signed)
Please advise 

## 2012-08-10 NOTE — Telephone Encounter (Signed)
pt called requesting appt for Blackened Private area with red spot & abd pain--SPOKE with Team lead she approved pt must go to Ed/UC--advised Mrs. Zeien as such she stated she wanted to do that all day since he was getting over [place] and has had his legs removed, but Alexis really wanted to see Dr.paz but she would get him to UC/Ed today

## 2012-08-10 NOTE — Telephone Encounter (Signed)
Received a fax from the pharmacy asking if it is ok for dose to be changed. Please advise.

## 2012-08-10 NOTE — Telephone Encounter (Signed)
I won't be able to see him tomorrow, if he has fever, chills, diarrhea or increased abdominal pain need to go to the ER or urgent care otherwise see if they doctor of the day could see him tomorrow

## 2012-08-10 NOTE — Telephone Encounter (Signed)
Notified patients pharmacy of changes. Med list updated.

## 2012-08-13 DIAGNOSIS — G35 Multiple sclerosis: Secondary | ICD-10-CM | POA: Diagnosis not present

## 2012-08-13 DIAGNOSIS — I10 Essential (primary) hypertension: Secondary | ICD-10-CM | POA: Diagnosis not present

## 2012-08-13 DIAGNOSIS — I2510 Atherosclerotic heart disease of native coronary artery without angina pectoris: Secondary | ICD-10-CM | POA: Diagnosis not present

## 2012-08-13 DIAGNOSIS — E119 Type 2 diabetes mellitus without complications: Secondary | ICD-10-CM | POA: Diagnosis not present

## 2012-08-13 DIAGNOSIS — I739 Peripheral vascular disease, unspecified: Secondary | ICD-10-CM | POA: Diagnosis not present

## 2012-08-13 DIAGNOSIS — Z4789 Encounter for other orthopedic aftercare: Secondary | ICD-10-CM | POA: Diagnosis not present

## 2012-08-14 DIAGNOSIS — I10 Essential (primary) hypertension: Secondary | ICD-10-CM | POA: Diagnosis not present

## 2012-08-14 DIAGNOSIS — I739 Peripheral vascular disease, unspecified: Secondary | ICD-10-CM | POA: Diagnosis not present

## 2012-08-14 DIAGNOSIS — G35 Multiple sclerosis: Secondary | ICD-10-CM | POA: Diagnosis not present

## 2012-08-14 DIAGNOSIS — E119 Type 2 diabetes mellitus without complications: Secondary | ICD-10-CM | POA: Diagnosis not present

## 2012-08-14 DIAGNOSIS — Z4789 Encounter for other orthopedic aftercare: Secondary | ICD-10-CM | POA: Diagnosis not present

## 2012-08-14 DIAGNOSIS — I2510 Atherosclerotic heart disease of native coronary artery without angina pectoris: Secondary | ICD-10-CM | POA: Diagnosis not present

## 2012-08-17 DIAGNOSIS — I739 Peripheral vascular disease, unspecified: Secondary | ICD-10-CM | POA: Diagnosis not present

## 2012-08-17 DIAGNOSIS — I2510 Atherosclerotic heart disease of native coronary artery without angina pectoris: Secondary | ICD-10-CM | POA: Diagnosis not present

## 2012-08-17 DIAGNOSIS — E119 Type 2 diabetes mellitus without complications: Secondary | ICD-10-CM | POA: Diagnosis not present

## 2012-08-17 DIAGNOSIS — G35 Multiple sclerosis: Secondary | ICD-10-CM | POA: Diagnosis not present

## 2012-08-17 DIAGNOSIS — I10 Essential (primary) hypertension: Secondary | ICD-10-CM | POA: Diagnosis not present

## 2012-08-17 DIAGNOSIS — Z4789 Encounter for other orthopedic aftercare: Secondary | ICD-10-CM | POA: Diagnosis not present

## 2012-08-20 DIAGNOSIS — E119 Type 2 diabetes mellitus without complications: Secondary | ICD-10-CM | POA: Diagnosis not present

## 2012-08-20 DIAGNOSIS — I2510 Atherosclerotic heart disease of native coronary artery without angina pectoris: Secondary | ICD-10-CM | POA: Diagnosis not present

## 2012-08-20 DIAGNOSIS — Z4789 Encounter for other orthopedic aftercare: Secondary | ICD-10-CM | POA: Diagnosis not present

## 2012-08-20 DIAGNOSIS — I10 Essential (primary) hypertension: Secondary | ICD-10-CM | POA: Diagnosis not present

## 2012-08-20 DIAGNOSIS — G35 Multiple sclerosis: Secondary | ICD-10-CM | POA: Diagnosis not present

## 2012-08-20 DIAGNOSIS — I739 Peripheral vascular disease, unspecified: Secondary | ICD-10-CM | POA: Diagnosis not present

## 2012-08-21 DIAGNOSIS — I2510 Atherosclerotic heart disease of native coronary artery without angina pectoris: Secondary | ICD-10-CM | POA: Diagnosis not present

## 2012-08-21 DIAGNOSIS — G35 Multiple sclerosis: Secondary | ICD-10-CM | POA: Diagnosis not present

## 2012-08-21 DIAGNOSIS — E119 Type 2 diabetes mellitus without complications: Secondary | ICD-10-CM | POA: Diagnosis not present

## 2012-08-21 DIAGNOSIS — Z4789 Encounter for other orthopedic aftercare: Secondary | ICD-10-CM | POA: Diagnosis not present

## 2012-08-21 DIAGNOSIS — I10 Essential (primary) hypertension: Secondary | ICD-10-CM | POA: Diagnosis not present

## 2012-08-21 DIAGNOSIS — I739 Peripheral vascular disease, unspecified: Secondary | ICD-10-CM | POA: Diagnosis not present

## 2012-08-26 DIAGNOSIS — Z4789 Encounter for other orthopedic aftercare: Secondary | ICD-10-CM | POA: Diagnosis not present

## 2012-08-26 DIAGNOSIS — I10 Essential (primary) hypertension: Secondary | ICD-10-CM | POA: Diagnosis not present

## 2012-08-26 DIAGNOSIS — I739 Peripheral vascular disease, unspecified: Secondary | ICD-10-CM | POA: Diagnosis not present

## 2012-08-26 DIAGNOSIS — I2510 Atherosclerotic heart disease of native coronary artery without angina pectoris: Secondary | ICD-10-CM | POA: Diagnosis not present

## 2012-08-26 DIAGNOSIS — E119 Type 2 diabetes mellitus without complications: Secondary | ICD-10-CM | POA: Diagnosis not present

## 2012-08-26 DIAGNOSIS — G35 Multiple sclerosis: Secondary | ICD-10-CM | POA: Diagnosis not present

## 2012-08-27 DIAGNOSIS — I10 Essential (primary) hypertension: Secondary | ICD-10-CM | POA: Diagnosis not present

## 2012-08-27 DIAGNOSIS — I2510 Atherosclerotic heart disease of native coronary artery without angina pectoris: Secondary | ICD-10-CM | POA: Diagnosis not present

## 2012-08-27 DIAGNOSIS — G35 Multiple sclerosis: Secondary | ICD-10-CM | POA: Diagnosis not present

## 2012-08-27 DIAGNOSIS — E119 Type 2 diabetes mellitus without complications: Secondary | ICD-10-CM | POA: Diagnosis not present

## 2012-08-27 DIAGNOSIS — Z4789 Encounter for other orthopedic aftercare: Secondary | ICD-10-CM | POA: Diagnosis not present

## 2012-08-27 DIAGNOSIS — I739 Peripheral vascular disease, unspecified: Secondary | ICD-10-CM | POA: Diagnosis not present

## 2012-08-28 DIAGNOSIS — Z4789 Encounter for other orthopedic aftercare: Secondary | ICD-10-CM | POA: Diagnosis not present

## 2012-08-28 DIAGNOSIS — G35 Multiple sclerosis: Secondary | ICD-10-CM | POA: Diagnosis not present

## 2012-08-28 DIAGNOSIS — I2510 Atherosclerotic heart disease of native coronary artery without angina pectoris: Secondary | ICD-10-CM | POA: Diagnosis not present

## 2012-08-28 DIAGNOSIS — I739 Peripheral vascular disease, unspecified: Secondary | ICD-10-CM | POA: Diagnosis not present

## 2012-08-28 DIAGNOSIS — E119 Type 2 diabetes mellitus without complications: Secondary | ICD-10-CM | POA: Diagnosis not present

## 2012-08-28 DIAGNOSIS — I10 Essential (primary) hypertension: Secondary | ICD-10-CM | POA: Diagnosis not present

## 2012-08-31 DIAGNOSIS — E119 Type 2 diabetes mellitus without complications: Secondary | ICD-10-CM | POA: Diagnosis not present

## 2012-08-31 DIAGNOSIS — Z4789 Encounter for other orthopedic aftercare: Secondary | ICD-10-CM | POA: Diagnosis not present

## 2012-08-31 DIAGNOSIS — I739 Peripheral vascular disease, unspecified: Secondary | ICD-10-CM | POA: Diagnosis not present

## 2012-08-31 DIAGNOSIS — G35 Multiple sclerosis: Secondary | ICD-10-CM | POA: Diagnosis not present

## 2012-08-31 DIAGNOSIS — I10 Essential (primary) hypertension: Secondary | ICD-10-CM | POA: Diagnosis not present

## 2012-08-31 DIAGNOSIS — I2510 Atherosclerotic heart disease of native coronary artery without angina pectoris: Secondary | ICD-10-CM | POA: Diagnosis not present

## 2012-09-01 DIAGNOSIS — I10 Essential (primary) hypertension: Secondary | ICD-10-CM | POA: Diagnosis not present

## 2012-09-01 DIAGNOSIS — I2510 Atherosclerotic heart disease of native coronary artery without angina pectoris: Secondary | ICD-10-CM | POA: Diagnosis not present

## 2012-09-01 DIAGNOSIS — Z4789 Encounter for other orthopedic aftercare: Secondary | ICD-10-CM | POA: Diagnosis not present

## 2012-09-01 DIAGNOSIS — E119 Type 2 diabetes mellitus without complications: Secondary | ICD-10-CM | POA: Diagnosis not present

## 2012-09-01 DIAGNOSIS — I739 Peripheral vascular disease, unspecified: Secondary | ICD-10-CM | POA: Diagnosis not present

## 2012-09-01 DIAGNOSIS — G35 Multiple sclerosis: Secondary | ICD-10-CM | POA: Diagnosis not present

## 2012-09-07 DIAGNOSIS — I2510 Atherosclerotic heart disease of native coronary artery without angina pectoris: Secondary | ICD-10-CM | POA: Diagnosis not present

## 2012-09-07 DIAGNOSIS — I739 Peripheral vascular disease, unspecified: Secondary | ICD-10-CM | POA: Diagnosis not present

## 2012-09-07 DIAGNOSIS — I10 Essential (primary) hypertension: Secondary | ICD-10-CM | POA: Diagnosis not present

## 2012-09-07 DIAGNOSIS — Z4789 Encounter for other orthopedic aftercare: Secondary | ICD-10-CM | POA: Diagnosis not present

## 2012-09-07 DIAGNOSIS — E119 Type 2 diabetes mellitus without complications: Secondary | ICD-10-CM | POA: Diagnosis not present

## 2012-09-07 DIAGNOSIS — G35 Multiple sclerosis: Secondary | ICD-10-CM | POA: Diagnosis not present

## 2012-09-08 ENCOUNTER — Telehealth: Payer: Self-pay | Admitting: Internal Medicine

## 2012-09-08 DIAGNOSIS — G35 Multiple sclerosis: Secondary | ICD-10-CM | POA: Diagnosis not present

## 2012-09-08 DIAGNOSIS — I2510 Atherosclerotic heart disease of native coronary artery without angina pectoris: Secondary | ICD-10-CM | POA: Diagnosis not present

## 2012-09-08 DIAGNOSIS — E119 Type 2 diabetes mellitus without complications: Secondary | ICD-10-CM | POA: Diagnosis not present

## 2012-09-08 DIAGNOSIS — Z4789 Encounter for other orthopedic aftercare: Secondary | ICD-10-CM | POA: Diagnosis not present

## 2012-09-08 DIAGNOSIS — I739 Peripheral vascular disease, unspecified: Secondary | ICD-10-CM | POA: Diagnosis not present

## 2012-09-08 DIAGNOSIS — I10 Essential (primary) hypertension: Secondary | ICD-10-CM | POA: Diagnosis not present

## 2012-09-08 NOTE — Telephone Encounter (Signed)
wife called stated pt  has rash & soreness in genitals-pt wants to bring a clean catch UA by for review.  Spoke wt/SJ she advised that was ok but pt needed to be seen in order to get appropriate treatment. I advised Mrs.Ngo as such, offered several appt. Times, she stated it is hard for her to get pt in and out as he is wheelchair bound and even if she could get him here we have no one to get him on the table to look at that area. Mrs.Tunney stated they have a home health nurse, so I advised her to call this nurse to see if she could come out today to see patient since she was reluctant in bringing him in.

## 2012-09-08 NOTE — Telephone Encounter (Signed)
Thank you :)

## 2012-09-08 NOTE — Telephone Encounter (Signed)
returned my call home health coming today to look at patient

## 2012-09-08 NOTE — Telephone Encounter (Signed)
Home health nurse will call the office after seeing the pt.

## 2012-09-09 ENCOUNTER — Ambulatory Visit: Payer: Medicare Other | Admitting: Cardiology

## 2012-09-09 DIAGNOSIS — G35 Multiple sclerosis: Secondary | ICD-10-CM | POA: Diagnosis not present

## 2012-09-09 DIAGNOSIS — E119 Type 2 diabetes mellitus without complications: Secondary | ICD-10-CM | POA: Diagnosis not present

## 2012-09-09 DIAGNOSIS — I10 Essential (primary) hypertension: Secondary | ICD-10-CM | POA: Diagnosis not present

## 2012-09-09 DIAGNOSIS — I2510 Atherosclerotic heart disease of native coronary artery without angina pectoris: Secondary | ICD-10-CM | POA: Diagnosis not present

## 2012-09-09 DIAGNOSIS — Z4789 Encounter for other orthopedic aftercare: Secondary | ICD-10-CM | POA: Diagnosis not present

## 2012-09-09 DIAGNOSIS — I739 Peripheral vascular disease, unspecified: Secondary | ICD-10-CM | POA: Diagnosis not present

## 2012-09-10 DIAGNOSIS — I2510 Atherosclerotic heart disease of native coronary artery without angina pectoris: Secondary | ICD-10-CM | POA: Diagnosis not present

## 2012-09-10 DIAGNOSIS — I10 Essential (primary) hypertension: Secondary | ICD-10-CM | POA: Diagnosis not present

## 2012-09-10 DIAGNOSIS — E119 Type 2 diabetes mellitus without complications: Secondary | ICD-10-CM | POA: Diagnosis not present

## 2012-09-10 DIAGNOSIS — G35 Multiple sclerosis: Secondary | ICD-10-CM | POA: Diagnosis not present

## 2012-09-10 DIAGNOSIS — Z4789 Encounter for other orthopedic aftercare: Secondary | ICD-10-CM | POA: Diagnosis not present

## 2012-09-10 DIAGNOSIS — I739 Peripheral vascular disease, unspecified: Secondary | ICD-10-CM | POA: Diagnosis not present

## 2012-09-11 DIAGNOSIS — I10 Essential (primary) hypertension: Secondary | ICD-10-CM | POA: Diagnosis not present

## 2012-09-11 DIAGNOSIS — I739 Peripheral vascular disease, unspecified: Secondary | ICD-10-CM | POA: Diagnosis not present

## 2012-09-11 DIAGNOSIS — I2510 Atherosclerotic heart disease of native coronary artery without angina pectoris: Secondary | ICD-10-CM | POA: Diagnosis not present

## 2012-09-11 DIAGNOSIS — G35 Multiple sclerosis: Secondary | ICD-10-CM | POA: Diagnosis not present

## 2012-09-11 DIAGNOSIS — Z4789 Encounter for other orthopedic aftercare: Secondary | ICD-10-CM | POA: Diagnosis not present

## 2012-09-11 DIAGNOSIS — E119 Type 2 diabetes mellitus without complications: Secondary | ICD-10-CM | POA: Diagnosis not present

## 2012-09-14 DIAGNOSIS — I739 Peripheral vascular disease, unspecified: Secondary | ICD-10-CM | POA: Diagnosis not present

## 2012-09-15 ENCOUNTER — Telehealth: Payer: Self-pay | Admitting: *Deleted

## 2012-09-15 MED ORDER — KETOCONAZOLE 2 % EX CREA: TOPICAL_CREAM | Freq: Two times a day (BID) | CUTANEOUS | Status: DC

## 2012-09-15 NOTE — Telephone Encounter (Signed)
Kim with [facility] called stating she put a foley in last Thursday & she thinks the pt has a yeast infection & would like an antibiotic called into the pt's pharmacy. Please advise.

## 2012-09-15 NOTE — Telephone Encounter (Signed)
Spoke with Selena Batten,( [phone]) home care nurse: She has urinary incontinence, skin in the groin was moist and slightly red, last week they call urology and a Foley was placed to see if that would help. The groin area still looks red, moist;  does not look swollen-warm (like cellulitis). Scrotum is not involved. The nurse suspect a fungal infection, I agree. Plan: Ketoconazole cream twice a day, keep area as dry as possible. Unable to prescribe Diflucan or oral ketoconazole due to interaction with Pamelor Call if not improving soon

## 2012-09-15 NOTE — Telephone Encounter (Signed)
I need more details, please ask the nurse to call me

## 2012-09-16 DIAGNOSIS — I739 Peripheral vascular disease, unspecified: Secondary | ICD-10-CM | POA: Diagnosis not present

## 2012-09-16 DIAGNOSIS — Z4789 Encounter for other orthopedic aftercare: Secondary | ICD-10-CM | POA: Diagnosis not present

## 2012-09-16 DIAGNOSIS — G35 Multiple sclerosis: Secondary | ICD-10-CM | POA: Diagnosis not present

## 2012-09-16 DIAGNOSIS — R339 Retention of urine, unspecified: Secondary | ICD-10-CM | POA: Diagnosis not present

## 2012-09-16 DIAGNOSIS — E119 Type 2 diabetes mellitus without complications: Secondary | ICD-10-CM | POA: Diagnosis not present

## 2012-09-16 DIAGNOSIS — I10 Essential (primary) hypertension: Secondary | ICD-10-CM | POA: Diagnosis not present

## 2012-09-16 NOTE — Telephone Encounter (Signed)
Pt's wife came in for lab appt & discussed with her. She stated she picked up the cream last night & it seems to be working.

## 2012-09-18 DIAGNOSIS — I10 Essential (primary) hypertension: Secondary | ICD-10-CM | POA: Diagnosis not present

## 2012-09-21 DIAGNOSIS — Z4789 Encounter for other orthopedic aftercare: Secondary | ICD-10-CM | POA: Diagnosis not present

## 2012-09-21 DIAGNOSIS — I2510 Atherosclerotic heart disease of native coronary artery without angina pectoris: Secondary | ICD-10-CM | POA: Diagnosis not present

## 2012-09-21 DIAGNOSIS — E119 Type 2 diabetes mellitus without complications: Secondary | ICD-10-CM | POA: Diagnosis not present

## 2012-09-23 DIAGNOSIS — I10 Essential (primary) hypertension: Secondary | ICD-10-CM | POA: Diagnosis not present

## 2012-09-23 DIAGNOSIS — Z4789 Encounter for other orthopedic aftercare: Secondary | ICD-10-CM | POA: Diagnosis not present

## 2012-09-23 DIAGNOSIS — I739 Peripheral vascular disease, unspecified: Secondary | ICD-10-CM | POA: Diagnosis not present

## 2012-09-23 DIAGNOSIS — G35 Multiple sclerosis: Secondary | ICD-10-CM | POA: Diagnosis not present

## 2012-09-23 DIAGNOSIS — E119 Type 2 diabetes mellitus without complications: Secondary | ICD-10-CM | POA: Diagnosis not present

## 2012-09-23 DIAGNOSIS — I2510 Atherosclerotic heart disease of native coronary artery without angina pectoris: Secondary | ICD-10-CM | POA: Diagnosis not present

## 2012-09-24 DIAGNOSIS — Z4789 Encounter for other orthopedic aftercare: Secondary | ICD-10-CM | POA: Diagnosis not present

## 2012-09-25 DIAGNOSIS — E119 Type 2 diabetes mellitus without complications: Secondary | ICD-10-CM | POA: Diagnosis not present

## 2012-09-25 DIAGNOSIS — I739 Peripheral vascular disease, unspecified: Secondary | ICD-10-CM | POA: Diagnosis not present

## 2012-09-25 DIAGNOSIS — I10 Essential (primary) hypertension: Secondary | ICD-10-CM | POA: Diagnosis not present

## 2012-09-25 DIAGNOSIS — G35 Multiple sclerosis: Secondary | ICD-10-CM | POA: Diagnosis not present

## 2012-09-25 DIAGNOSIS — Z4789 Encounter for other orthopedic aftercare: Secondary | ICD-10-CM | POA: Diagnosis not present

## 2012-09-25 DIAGNOSIS — I2510 Atherosclerotic heart disease of native coronary artery without angina pectoris: Secondary | ICD-10-CM | POA: Diagnosis not present

## 2012-09-26 DIAGNOSIS — G35 Multiple sclerosis: Secondary | ICD-10-CM | POA: Diagnosis not present

## 2012-09-26 DIAGNOSIS — I2510 Atherosclerotic heart disease of native coronary artery without angina pectoris: Secondary | ICD-10-CM | POA: Diagnosis not present

## 2012-09-26 DIAGNOSIS — I10 Essential (primary) hypertension: Secondary | ICD-10-CM | POA: Diagnosis not present

## 2012-09-26 DIAGNOSIS — Z4789 Encounter for other orthopedic aftercare: Secondary | ICD-10-CM | POA: Diagnosis not present

## 2012-09-26 DIAGNOSIS — I739 Peripheral vascular disease, unspecified: Secondary | ICD-10-CM | POA: Diagnosis not present

## 2012-09-26 DIAGNOSIS — E119 Type 2 diabetes mellitus without complications: Secondary | ICD-10-CM | POA: Diagnosis not present

## 2012-09-28 DIAGNOSIS — I739 Peripheral vascular disease, unspecified: Secondary | ICD-10-CM | POA: Diagnosis not present

## 2012-09-28 DIAGNOSIS — I2510 Atherosclerotic heart disease of native coronary artery without angina pectoris: Secondary | ICD-10-CM | POA: Diagnosis not present

## 2012-09-28 DIAGNOSIS — I10 Essential (primary) hypertension: Secondary | ICD-10-CM | POA: Diagnosis not present

## 2012-09-28 DIAGNOSIS — G35 Multiple sclerosis: Secondary | ICD-10-CM | POA: Diagnosis not present

## 2012-09-28 DIAGNOSIS — E119 Type 2 diabetes mellitus without complications: Secondary | ICD-10-CM | POA: Diagnosis not present

## 2012-09-28 DIAGNOSIS — Z4789 Encounter for other orthopedic aftercare: Secondary | ICD-10-CM | POA: Diagnosis not present

## 2012-09-29 DIAGNOSIS — I739 Peripheral vascular disease, unspecified: Secondary | ICD-10-CM | POA: Diagnosis not present

## 2012-09-30 DIAGNOSIS — I739 Peripheral vascular disease, unspecified: Secondary | ICD-10-CM | POA: Diagnosis not present

## 2012-10-01 DIAGNOSIS — E119 Type 2 diabetes mellitus without complications: Secondary | ICD-10-CM | POA: Diagnosis not present

## 2012-10-01 DIAGNOSIS — Z4789 Encounter for other orthopedic aftercare: Secondary | ICD-10-CM | POA: Diagnosis not present

## 2012-10-01 DIAGNOSIS — G35 Multiple sclerosis: Secondary | ICD-10-CM | POA: Diagnosis not present

## 2012-10-01 DIAGNOSIS — I10 Essential (primary) hypertension: Secondary | ICD-10-CM | POA: Diagnosis not present

## 2012-10-01 DIAGNOSIS — I739 Peripheral vascular disease, unspecified: Secondary | ICD-10-CM | POA: Diagnosis not present

## 2012-10-01 DIAGNOSIS — I2510 Atherosclerotic heart disease of native coronary artery without angina pectoris: Secondary | ICD-10-CM | POA: Diagnosis not present

## 2012-10-02 DIAGNOSIS — R339 Retention of urine, unspecified: Secondary | ICD-10-CM | POA: Diagnosis not present

## 2012-10-02 DIAGNOSIS — I2510 Atherosclerotic heart disease of native coronary artery without angina pectoris: Secondary | ICD-10-CM | POA: Diagnosis not present

## 2012-10-03 ENCOUNTER — Other Ambulatory Visit: Payer: Self-pay | Admitting: Physical Medicine & Rehabilitation

## 2012-10-05 DIAGNOSIS — I739 Peripheral vascular disease, unspecified: Secondary | ICD-10-CM | POA: Diagnosis not present

## 2012-10-07 DIAGNOSIS — I739 Peripheral vascular disease, unspecified: Secondary | ICD-10-CM | POA: Diagnosis not present

## 2012-10-08 ENCOUNTER — Other Ambulatory Visit: Payer: Self-pay

## 2012-10-08 DIAGNOSIS — I739 Peripheral vascular disease, unspecified: Secondary | ICD-10-CM | POA: Diagnosis not present

## 2012-10-09 DIAGNOSIS — E119 Type 2 diabetes mellitus without complications: Secondary | ICD-10-CM | POA: Diagnosis not present

## 2012-10-09 DIAGNOSIS — I10 Essential (primary) hypertension: Secondary | ICD-10-CM | POA: Diagnosis not present

## 2012-10-09 DIAGNOSIS — G35 Multiple sclerosis: Secondary | ICD-10-CM | POA: Diagnosis not present

## 2012-10-09 DIAGNOSIS — Z4789 Encounter for other orthopedic aftercare: Secondary | ICD-10-CM | POA: Diagnosis not present

## 2012-10-09 DIAGNOSIS — I739 Peripheral vascular disease, unspecified: Secondary | ICD-10-CM | POA: Diagnosis not present

## 2012-10-09 DIAGNOSIS — I2510 Atherosclerotic heart disease of native coronary artery without angina pectoris: Secondary | ICD-10-CM | POA: Diagnosis not present

## 2012-10-12 ENCOUNTER — Ambulatory Visit: Payer: Medicare Other | Admitting: Internal Medicine

## 2012-10-12 DIAGNOSIS — Z4789 Encounter for other orthopedic aftercare: Secondary | ICD-10-CM | POA: Diagnosis not present

## 2012-10-12 DIAGNOSIS — E119 Type 2 diabetes mellitus without complications: Secondary | ICD-10-CM | POA: Diagnosis not present

## 2012-10-12 DIAGNOSIS — I739 Peripheral vascular disease, unspecified: Secondary | ICD-10-CM | POA: Diagnosis not present

## 2012-10-12 DIAGNOSIS — I2510 Atherosclerotic heart disease of native coronary artery without angina pectoris: Secondary | ICD-10-CM | POA: Diagnosis not present

## 2012-10-12 DIAGNOSIS — I10 Essential (primary) hypertension: Secondary | ICD-10-CM | POA: Diagnosis not present

## 2012-10-12 DIAGNOSIS — G35 Multiple sclerosis: Secondary | ICD-10-CM | POA: Diagnosis not present

## 2012-10-13 ENCOUNTER — Telehealth: Payer: Self-pay | Admitting: *Deleted

## 2012-10-13 NOTE — Telephone Encounter (Signed)
We have received a refill request on Jaime [name] for baclofen.  This is something that Jesusita Oka originally ordered back in September/October 2013. He has an appointment in May with you. Do you want to refill? ( baclofen 10 mg 1/2 po bid)

## 2012-10-13 NOTE — Telephone Encounter (Signed)
May refill 

## 2012-10-14 DIAGNOSIS — Z4789 Encounter for other orthopedic aftercare: Secondary | ICD-10-CM | POA: Diagnosis not present

## 2012-10-14 DIAGNOSIS — I2510 Atherosclerotic heart disease of native coronary artery without angina pectoris: Secondary | ICD-10-CM | POA: Diagnosis not present

## 2012-10-14 DIAGNOSIS — E119 Type 2 diabetes mellitus without complications: Secondary | ICD-10-CM | POA: Diagnosis not present

## 2012-10-14 DIAGNOSIS — G35 Multiple sclerosis: Secondary | ICD-10-CM | POA: Diagnosis not present

## 2012-10-14 DIAGNOSIS — I739 Peripheral vascular disease, unspecified: Secondary | ICD-10-CM | POA: Diagnosis not present

## 2012-10-14 DIAGNOSIS — I10 Essential (primary) hypertension: Secondary | ICD-10-CM | POA: Diagnosis not present

## 2012-10-14 MED ORDER — BACLOFEN 10 MG PO TABS: 5.0000 mg | ORAL_TABLET | Freq: Two times a day (BID) | ORAL | Status: DC

## 2012-10-15 DIAGNOSIS — Z4789 Encounter for other orthopedic aftercare: Secondary | ICD-10-CM | POA: Diagnosis not present

## 2012-10-15 DIAGNOSIS — I2510 Atherosclerotic heart disease of native coronary artery without angina pectoris: Secondary | ICD-10-CM | POA: Diagnosis not present

## 2012-10-15 DIAGNOSIS — E119 Type 2 diabetes mellitus without complications: Secondary | ICD-10-CM | POA: Diagnosis not present

## 2012-10-15 DIAGNOSIS — I739 Peripheral vascular disease, unspecified: Secondary | ICD-10-CM | POA: Diagnosis not present

## 2012-10-15 DIAGNOSIS — G35 Multiple sclerosis: Secondary | ICD-10-CM | POA: Diagnosis not present

## 2012-10-15 DIAGNOSIS — I10 Essential (primary) hypertension: Secondary | ICD-10-CM | POA: Diagnosis not present

## 2012-10-16 ENCOUNTER — Telehealth: Payer: Self-pay | Admitting: *Deleted

## 2012-10-16 DIAGNOSIS — E119 Type 2 diabetes mellitus without complications: Secondary | ICD-10-CM | POA: Diagnosis not present

## 2012-10-16 DIAGNOSIS — I10 Essential (primary) hypertension: Secondary | ICD-10-CM | POA: Diagnosis not present

## 2012-10-16 DIAGNOSIS — Z4789 Encounter for other orthopedic aftercare: Secondary | ICD-10-CM | POA: Diagnosis not present

## 2012-10-16 DIAGNOSIS — I2510 Atherosclerotic heart disease of native coronary artery without angina pectoris: Secondary | ICD-10-CM | POA: Diagnosis not present

## 2012-10-16 DIAGNOSIS — G35 Multiple sclerosis: Secondary | ICD-10-CM | POA: Diagnosis not present

## 2012-10-16 DIAGNOSIS — I739 Peripheral vascular disease, unspecified: Secondary | ICD-10-CM | POA: Diagnosis not present

## 2012-10-16 MED ORDER — METOPROLOL SUCCINATE ER 25 MG PO TB24: 37.5000 mg | ORAL_TABLET | Freq: Every day | ORAL | Status: DC

## 2012-10-16 NOTE — Telephone Encounter (Signed)
Refill done.  

## 2012-10-19 DIAGNOSIS — I2510 Atherosclerotic heart disease of native coronary artery without angina pectoris: Secondary | ICD-10-CM | POA: Diagnosis not present

## 2012-10-19 DIAGNOSIS — G35 Multiple sclerosis: Secondary | ICD-10-CM | POA: Diagnosis not present

## 2012-10-19 DIAGNOSIS — I739 Peripheral vascular disease, unspecified: Secondary | ICD-10-CM | POA: Diagnosis not present

## 2012-10-19 DIAGNOSIS — E119 Type 2 diabetes mellitus without complications: Secondary | ICD-10-CM | POA: Diagnosis not present

## 2012-10-19 DIAGNOSIS — Z4789 Encounter for other orthopedic aftercare: Secondary | ICD-10-CM | POA: Diagnosis not present

## 2012-10-19 DIAGNOSIS — I10 Essential (primary) hypertension: Secondary | ICD-10-CM | POA: Diagnosis not present

## 2012-10-21 DIAGNOSIS — E119 Type 2 diabetes mellitus without complications: Secondary | ICD-10-CM | POA: Diagnosis not present

## 2012-10-21 DIAGNOSIS — G35 Multiple sclerosis: Secondary | ICD-10-CM | POA: Diagnosis not present

## 2012-10-21 DIAGNOSIS — I739 Peripheral vascular disease, unspecified: Secondary | ICD-10-CM | POA: Diagnosis not present

## 2012-10-21 DIAGNOSIS — I2510 Atherosclerotic heart disease of native coronary artery without angina pectoris: Secondary | ICD-10-CM | POA: Diagnosis not present

## 2012-10-21 DIAGNOSIS — Z4789 Encounter for other orthopedic aftercare: Secondary | ICD-10-CM | POA: Diagnosis not present

## 2012-10-21 DIAGNOSIS — I10 Essential (primary) hypertension: Secondary | ICD-10-CM | POA: Diagnosis not present

## 2012-10-22 ENCOUNTER — Encounter: Payer: Self-pay | Admitting: Internal Medicine

## 2012-10-22 ENCOUNTER — Ambulatory Visit (INDEPENDENT_AMBULATORY_CARE_PROVIDER_SITE_OTHER): Payer: Medicare Other | Admitting: Internal Medicine

## 2012-10-22 VITALS — BP 132/84 | HR 58 | Temp 97.8°F

## 2012-10-22 DIAGNOSIS — G546 Phantom limb syndrome with pain: Secondary | ICD-10-CM

## 2012-10-22 DIAGNOSIS — E785 Hyperlipidemia, unspecified: Secondary | ICD-10-CM | POA: Diagnosis not present

## 2012-10-22 DIAGNOSIS — G547 Phantom limb syndrome without pain: Secondary | ICD-10-CM

## 2012-10-22 DIAGNOSIS — E119 Type 2 diabetes mellitus without complications: Secondary | ICD-10-CM

## 2012-10-22 DIAGNOSIS — I2510 Atherosclerotic heart disease of native coronary artery without angina pectoris: Secondary | ICD-10-CM | POA: Diagnosis not present

## 2012-10-22 DIAGNOSIS — I10 Essential (primary) hypertension: Secondary | ICD-10-CM | POA: Diagnosis not present

## 2012-10-22 LAB — AST: AST: 24 U/L (ref 0–37)

## 2012-10-22 LAB — LIPID PANEL
Total CHOL/HDL Ratio: 4
VLDL: 14.8 mg/dL (ref 0.0–40.0)

## 2012-10-22 LAB — BASIC METABOLIC PANEL
BUN: 15 mg/dL (ref 6–23)
CO2: 25 mEq/L (ref 19–32)
Calcium: 8.9 mg/dL (ref 8.4–10.5)
Creatinine, Ser: 0.6 mg/dL (ref 0.4–1.5)

## 2012-10-22 LAB — TSH: TSH: 1.19 u[IU]/mL (ref 0.35–5.50)

## 2012-10-22 NOTE — Assessment & Plan Note (Addendum)
Good compliance of medication, check a FLP, AST ALT.

## 2012-10-22 NOTE — Assessment & Plan Note (Signed)
Last visit with cardiology December 2013, he was stable. Currently asymptomatic.

## 2012-10-22 NOTE — Assessment & Plan Note (Addendum)
Last A1c 7.1 on November 2013, has not seen endocrinology recently. States has an appointment pending for early May

## 2012-10-22 NOTE — Progress Notes (Signed)
  Subjective:    Patient ID: Jaime [name], male    DOB: 03-12-1941, 72 y.o.   MRN: [id number]  HPI Routine office visit, here with his wife. Pain management: Phantom pain well-controlled with Neurontin, nortriptyline and Mobic. Uses hydrocodone as needed  Shoulder pain increased fir the last 2 weeks . Diabetes, good medication compliance, has not seen Dr. Lisabeth Devoid recently. High cholesterol, good medication compliance. Hypertension, visiting nurse check his BP daily and is reportedly within normal.  Past Medical History:  MS  DM  Hyperlipidemia  Pulmonary hypertension moderate.... echo.... January, 2008  Ischemic cardiomyopathy  CAD non-STEMI.. 2007.Marland Kitchen occluded circumflex... Taxus stent placed.... residual 80% LAD.Marland Kitchen 50% RCA  Peripheral vascular disease  BPH  Lung nodule-- resolved per 11-2006 CT chest    Past Surgical History  Procedure Laterality Date  . Hernia repair  1990    (R)  . Thoracentesis  2008    PLEURAL EFFUSION  . Cystoscopy  07/30/2011    Procedure: CYSTOSCOPY;  Surgeon: Lindaann Slough, MD;  Location: [facility];  Service: Urology;  Laterality: N/A;  . Transurethral resection of prostate  07/30/2011    Procedure: TRANSURETHRAL RESECTION OF THE PROSTATE (TURP);  Surgeon: Lindaann Slough, MD;  Location: [facility];  Service: Urology;  Laterality: N/A;  . Femoral-tibial bypass graft  03/11/2012    Procedure: BYPASS GRAFT FEMORAL-TIBIAL ARTERY;  Surgeon: Fransisco Hertz, MD;  Location: [facility] OR;  Service: Vascular;  Laterality: Left;  Left Femoral -Tibial trunk bypass, Endarterectomy of Tibial- Peroneal trunk with vein angioplasty.  . Intraoperative arteriogram  03/11/2012    Procedure: INTRA OPERATIVE ARTERIOGRAM;  Surgeon: Fransisco Hertz, MD;  Location: [facility] OR;  Service: Vascular;  Laterality: Left;  . Femoral-popliteal bypass graft  03/11/2012    Procedure: BYPASS GRAFT FEMORAL-POPLITEAL ARTERY;  Surgeon: Fransisco Hertz, MD;  Location: [facility] OR;  Service:  Vascular;  Laterality: Left;  embolectomy left lower leg  . Amputation  03/17/2012    Procedure: AMPUTATION ABOVE KNEE;  Surgeon: Fransisco Hertz, MD;  Location: [facility] OR;  Service: Vascular;  Laterality: Left;  . Coronary angioplasty with stent placement  05-01-2006    OCCLUDED CIRCUMFLEX -- TAXUS STENT PLACMENT  AND RESIDUAL 80% LAD,  50% RCA  . Amputation  06/03/2012    Procedure: AMPUTATION ABOVE KNEE;  Surgeon: Fransisco Hertz, MD;  Location: [facility] OR;  Service: Vascular;  Laterality: Right;   Social History:  Married, children x 2  As of 4-14 help w/ bath and nurse visits ([employer])  Current Smoker, quit 02-2012, occ E-cigarret  Alcohol use-no    Review of Systems Denies chest pain or shortness or breath No nausea, vomiting, diarrhea.      Objective:   Physical Exam General -- alert, no apparent physical or emotional distress, seems comfortable in his wheelchair. Lungs -- decreased breath sounds bilaterally  Heart-- normal rate, regular rhythm, no murmur, and no gallop.   Extremities--  stumps without edema. Range of motion slightly limited bilaterally, elicited pain on the right. Neurologic-- alert & oriented X3   Psych-- Cognition and judgment appear intact. Alert and cooperative with normal attention span and concentration.  not anxious appearing and not depressed appearing.      Assessment & Plan:

## 2012-10-22 NOTE — Assessment & Plan Note (Addendum)
Seems well-controlled with Neurontin, nortriptyline. He also takes Mobic daily and hydrocodone as needed. I asked him to d/c Mobic, I don't recommend that medication long-term in his situation.

## 2012-10-22 NOTE — Assessment & Plan Note (Signed)
Well-controlled,  check a BMP 

## 2012-10-22 NOTE — Patient Instructions (Addendum)
Please discontinue MOBIC, may affect your kidneys. Call me if they pain increases. Next visit to see me in 5-6 months, please schedule

## 2012-10-22 NOTE — Assessment & Plan Note (Signed)
Not better, plans to see Dr Doroteo Bradford in few weeks

## 2012-10-23 DIAGNOSIS — I10 Essential (primary) hypertension: Secondary | ICD-10-CM | POA: Diagnosis not present

## 2012-10-23 DIAGNOSIS — Z4789 Encounter for other orthopedic aftercare: Secondary | ICD-10-CM | POA: Diagnosis not present

## 2012-10-23 DIAGNOSIS — I2510 Atherosclerotic heart disease of native coronary artery without angina pectoris: Secondary | ICD-10-CM | POA: Diagnosis not present

## 2012-10-23 DIAGNOSIS — I739 Peripheral vascular disease, unspecified: Secondary | ICD-10-CM | POA: Diagnosis not present

## 2012-10-23 DIAGNOSIS — E119 Type 2 diabetes mellitus without complications: Secondary | ICD-10-CM | POA: Diagnosis not present

## 2012-10-23 DIAGNOSIS — G35 Multiple sclerosis: Secondary | ICD-10-CM | POA: Diagnosis not present

## 2012-10-26 ENCOUNTER — Telehealth: Payer: Self-pay | Admitting: *Deleted

## 2012-10-26 DIAGNOSIS — Z4789 Encounter for other orthopedic aftercare: Secondary | ICD-10-CM | POA: Diagnosis not present

## 2012-10-26 DIAGNOSIS — I10 Essential (primary) hypertension: Secondary | ICD-10-CM | POA: Diagnosis not present

## 2012-10-26 DIAGNOSIS — I739 Peripheral vascular disease, unspecified: Secondary | ICD-10-CM | POA: Diagnosis not present

## 2012-10-26 DIAGNOSIS — I2510 Atherosclerotic heart disease of native coronary artery without angina pectoris: Secondary | ICD-10-CM | POA: Diagnosis not present

## 2012-10-26 DIAGNOSIS — E119 Type 2 diabetes mellitus without complications: Secondary | ICD-10-CM | POA: Diagnosis not present

## 2012-10-26 DIAGNOSIS — G35 Multiple sclerosis: Secondary | ICD-10-CM | POA: Diagnosis not present

## 2012-10-26 MED ORDER — ATORVASTATIN CALCIUM 40 MG PO TABS: 40.0000 mg | ORAL_TABLET | Freq: Every day | ORAL | Status: DC

## 2012-10-26 NOTE — Telephone Encounter (Signed)
Wanda Plump, MD - d/c simvastatin, call in Lipitor 40 mg one by mouth each bedtime, #30 and 3 refills  Discussed with pt's wife.  rx sent to pharmacy.

## 2012-10-28 DIAGNOSIS — I10 Essential (primary) hypertension: Secondary | ICD-10-CM | POA: Diagnosis not present

## 2012-10-28 DIAGNOSIS — Z4789 Encounter for other orthopedic aftercare: Secondary | ICD-10-CM | POA: Diagnosis not present

## 2012-10-28 DIAGNOSIS — E119 Type 2 diabetes mellitus without complications: Secondary | ICD-10-CM | POA: Diagnosis not present

## 2012-10-28 DIAGNOSIS — G35 Multiple sclerosis: Secondary | ICD-10-CM | POA: Diagnosis not present

## 2012-10-28 DIAGNOSIS — I739 Peripheral vascular disease, unspecified: Secondary | ICD-10-CM | POA: Diagnosis not present

## 2012-10-28 DIAGNOSIS — I2510 Atherosclerotic heart disease of native coronary artery without angina pectoris: Secondary | ICD-10-CM | POA: Diagnosis not present

## 2012-10-30 DIAGNOSIS — Z4789 Encounter for other orthopedic aftercare: Secondary | ICD-10-CM | POA: Diagnosis not present

## 2012-10-30 DIAGNOSIS — I739 Peripheral vascular disease, unspecified: Secondary | ICD-10-CM | POA: Diagnosis not present

## 2012-10-30 DIAGNOSIS — R339 Retention of urine, unspecified: Secondary | ICD-10-CM | POA: Diagnosis not present

## 2012-10-30 DIAGNOSIS — E119 Type 2 diabetes mellitus without complications: Secondary | ICD-10-CM | POA: Diagnosis not present

## 2012-10-30 DIAGNOSIS — G35 Multiple sclerosis: Secondary | ICD-10-CM | POA: Diagnosis not present

## 2012-10-30 DIAGNOSIS — I10 Essential (primary) hypertension: Secondary | ICD-10-CM | POA: Diagnosis not present

## 2012-10-30 DIAGNOSIS — I2510 Atherosclerotic heart disease of native coronary artery without angina pectoris: Secondary | ICD-10-CM | POA: Diagnosis not present

## 2012-11-02 DIAGNOSIS — I10 Essential (primary) hypertension: Secondary | ICD-10-CM | POA: Diagnosis not present

## 2012-11-02 DIAGNOSIS — I2510 Atherosclerotic heart disease of native coronary artery without angina pectoris: Secondary | ICD-10-CM | POA: Diagnosis not present

## 2012-11-02 DIAGNOSIS — E119 Type 2 diabetes mellitus without complications: Secondary | ICD-10-CM | POA: Diagnosis not present

## 2012-11-02 DIAGNOSIS — Z4789 Encounter for other orthopedic aftercare: Secondary | ICD-10-CM | POA: Diagnosis not present

## 2012-11-02 DIAGNOSIS — I739 Peripheral vascular disease, unspecified: Secondary | ICD-10-CM | POA: Diagnosis not present

## 2012-11-02 DIAGNOSIS — G35 Multiple sclerosis: Secondary | ICD-10-CM | POA: Diagnosis not present

## 2012-11-04 DIAGNOSIS — I2510 Atherosclerotic heart disease of native coronary artery without angina pectoris: Secondary | ICD-10-CM | POA: Diagnosis not present

## 2012-11-04 DIAGNOSIS — I10 Essential (primary) hypertension: Secondary | ICD-10-CM | POA: Diagnosis not present

## 2012-11-04 DIAGNOSIS — I739 Peripheral vascular disease, unspecified: Secondary | ICD-10-CM | POA: Diagnosis not present

## 2012-11-04 DIAGNOSIS — E119 Type 2 diabetes mellitus without complications: Secondary | ICD-10-CM | POA: Diagnosis not present

## 2012-11-04 DIAGNOSIS — Z4789 Encounter for other orthopedic aftercare: Secondary | ICD-10-CM | POA: Diagnosis not present

## 2012-11-04 DIAGNOSIS — G35 Multiple sclerosis: Secondary | ICD-10-CM | POA: Diagnosis not present

## 2012-11-05 ENCOUNTER — Encounter: Payer: Self-pay | Admitting: Cardiology

## 2012-11-06 DIAGNOSIS — I739 Peripheral vascular disease, unspecified: Secondary | ICD-10-CM | POA: Diagnosis not present

## 2012-11-06 DIAGNOSIS — G35 Multiple sclerosis: Secondary | ICD-10-CM | POA: Diagnosis not present

## 2012-11-06 DIAGNOSIS — I10 Essential (primary) hypertension: Secondary | ICD-10-CM | POA: Diagnosis not present

## 2012-11-06 DIAGNOSIS — Z4789 Encounter for other orthopedic aftercare: Secondary | ICD-10-CM | POA: Diagnosis not present

## 2012-11-06 DIAGNOSIS — I2510 Atherosclerotic heart disease of native coronary artery without angina pectoris: Secondary | ICD-10-CM | POA: Diagnosis not present

## 2012-11-06 DIAGNOSIS — E119 Type 2 diabetes mellitus without complications: Secondary | ICD-10-CM | POA: Diagnosis not present

## 2012-11-09 DIAGNOSIS — G35 Multiple sclerosis: Secondary | ICD-10-CM | POA: Diagnosis not present

## 2012-11-09 DIAGNOSIS — I739 Peripheral vascular disease, unspecified: Secondary | ICD-10-CM | POA: Diagnosis not present

## 2012-11-09 DIAGNOSIS — I10 Essential (primary) hypertension: Secondary | ICD-10-CM | POA: Diagnosis not present

## 2012-11-09 DIAGNOSIS — Z4789 Encounter for other orthopedic aftercare: Secondary | ICD-10-CM | POA: Diagnosis not present

## 2012-11-09 DIAGNOSIS — E119 Type 2 diabetes mellitus without complications: Secondary | ICD-10-CM | POA: Diagnosis not present

## 2012-11-09 DIAGNOSIS — I2510 Atherosclerotic heart disease of native coronary artery without angina pectoris: Secondary | ICD-10-CM | POA: Diagnosis not present

## 2012-11-11 DIAGNOSIS — G35 Multiple sclerosis: Secondary | ICD-10-CM | POA: Diagnosis not present

## 2012-11-11 DIAGNOSIS — I739 Peripheral vascular disease, unspecified: Secondary | ICD-10-CM | POA: Diagnosis not present

## 2012-11-11 DIAGNOSIS — I10 Essential (primary) hypertension: Secondary | ICD-10-CM | POA: Diagnosis not present

## 2012-11-11 DIAGNOSIS — I2510 Atherosclerotic heart disease of native coronary artery without angina pectoris: Secondary | ICD-10-CM | POA: Diagnosis not present

## 2012-11-11 DIAGNOSIS — E119 Type 2 diabetes mellitus without complications: Secondary | ICD-10-CM | POA: Diagnosis not present

## 2012-11-11 DIAGNOSIS — Z4789 Encounter for other orthopedic aftercare: Secondary | ICD-10-CM | POA: Diagnosis not present

## 2012-11-16 DIAGNOSIS — I10 Essential (primary) hypertension: Secondary | ICD-10-CM | POA: Diagnosis not present

## 2012-11-16 DIAGNOSIS — I739 Peripheral vascular disease, unspecified: Secondary | ICD-10-CM | POA: Diagnosis not present

## 2012-11-16 DIAGNOSIS — E119 Type 2 diabetes mellitus without complications: Secondary | ICD-10-CM | POA: Diagnosis not present

## 2012-11-16 DIAGNOSIS — G35 Multiple sclerosis: Secondary | ICD-10-CM | POA: Diagnosis not present

## 2012-11-16 DIAGNOSIS — I2510 Atherosclerotic heart disease of native coronary artery without angina pectoris: Secondary | ICD-10-CM | POA: Diagnosis not present

## 2012-11-16 DIAGNOSIS — Z4789 Encounter for other orthopedic aftercare: Secondary | ICD-10-CM | POA: Diagnosis not present

## 2012-11-18 DIAGNOSIS — I739 Peripheral vascular disease, unspecified: Secondary | ICD-10-CM | POA: Diagnosis not present

## 2012-11-18 DIAGNOSIS — I10 Essential (primary) hypertension: Secondary | ICD-10-CM | POA: Diagnosis not present

## 2012-11-18 DIAGNOSIS — I2510 Atherosclerotic heart disease of native coronary artery without angina pectoris: Secondary | ICD-10-CM | POA: Diagnosis not present

## 2012-11-18 DIAGNOSIS — E119 Type 2 diabetes mellitus without complications: Secondary | ICD-10-CM | POA: Diagnosis not present

## 2012-11-18 DIAGNOSIS — Z4789 Encounter for other orthopedic aftercare: Secondary | ICD-10-CM | POA: Diagnosis not present

## 2012-11-18 DIAGNOSIS — G35 Multiple sclerosis: Secondary | ICD-10-CM | POA: Diagnosis not present

## 2012-11-20 DIAGNOSIS — I10 Essential (primary) hypertension: Secondary | ICD-10-CM | POA: Diagnosis not present

## 2012-11-20 DIAGNOSIS — E119 Type 2 diabetes mellitus without complications: Secondary | ICD-10-CM | POA: Diagnosis not present

## 2012-11-20 DIAGNOSIS — I739 Peripheral vascular disease, unspecified: Secondary | ICD-10-CM | POA: Diagnosis not present

## 2012-11-20 DIAGNOSIS — Z4789 Encounter for other orthopedic aftercare: Secondary | ICD-10-CM | POA: Diagnosis not present

## 2012-11-20 DIAGNOSIS — I2510 Atherosclerotic heart disease of native coronary artery without angina pectoris: Secondary | ICD-10-CM | POA: Diagnosis not present

## 2012-11-20 DIAGNOSIS — G35 Multiple sclerosis: Secondary | ICD-10-CM | POA: Diagnosis not present

## 2012-11-23 DIAGNOSIS — E119 Type 2 diabetes mellitus without complications: Secondary | ICD-10-CM | POA: Diagnosis not present

## 2012-11-23 DIAGNOSIS — G35 Multiple sclerosis: Secondary | ICD-10-CM | POA: Diagnosis not present

## 2012-11-23 DIAGNOSIS — I2510 Atherosclerotic heart disease of native coronary artery without angina pectoris: Secondary | ICD-10-CM | POA: Diagnosis not present

## 2012-11-23 DIAGNOSIS — I739 Peripheral vascular disease, unspecified: Secondary | ICD-10-CM | POA: Diagnosis not present

## 2012-11-23 DIAGNOSIS — Z4789 Encounter for other orthopedic aftercare: Secondary | ICD-10-CM | POA: Diagnosis not present

## 2012-11-23 DIAGNOSIS — I10 Essential (primary) hypertension: Secondary | ICD-10-CM | POA: Diagnosis not present

## 2012-11-25 DIAGNOSIS — I2510 Atherosclerotic heart disease of native coronary artery without angina pectoris: Secondary | ICD-10-CM | POA: Diagnosis not present

## 2012-11-25 DIAGNOSIS — E119 Type 2 diabetes mellitus without complications: Secondary | ICD-10-CM | POA: Diagnosis not present

## 2012-11-25 DIAGNOSIS — I739 Peripheral vascular disease, unspecified: Secondary | ICD-10-CM | POA: Diagnosis not present

## 2012-11-25 DIAGNOSIS — Z4789 Encounter for other orthopedic aftercare: Secondary | ICD-10-CM | POA: Diagnosis not present

## 2012-11-25 DIAGNOSIS — I10 Essential (primary) hypertension: Secondary | ICD-10-CM | POA: Diagnosis not present

## 2012-11-25 DIAGNOSIS — G35 Multiple sclerosis: Secondary | ICD-10-CM | POA: Diagnosis not present

## 2012-11-30 ENCOUNTER — Encounter: Payer: Medicare Other | Attending: Physical Medicine & Rehabilitation

## 2012-11-30 ENCOUNTER — Ambulatory Visit (HOSPITAL_BASED_OUTPATIENT_CLINIC_OR_DEPARTMENT_OTHER): Payer: Medicare Other | Admitting: Physical Medicine & Rehabilitation

## 2012-11-30 ENCOUNTER — Encounter: Payer: Self-pay | Admitting: Physical Medicine & Rehabilitation

## 2012-11-30 VITALS — BP 160/99 | HR 74 | Resp 14 | Ht 71.0 in | Wt 140.0 lb

## 2012-11-30 DIAGNOSIS — I10 Essential (primary) hypertension: Secondary | ICD-10-CM | POA: Insufficient documentation

## 2012-11-30 DIAGNOSIS — E7800 Pure hypercholesterolemia, unspecified: Secondary | ICD-10-CM | POA: Diagnosis not present

## 2012-11-30 DIAGNOSIS — G35 Multiple sclerosis: Secondary | ICD-10-CM

## 2012-11-30 DIAGNOSIS — G547 Phantom limb syndrome without pain: Secondary | ICD-10-CM | POA: Insufficient documentation

## 2012-11-30 DIAGNOSIS — Z79899 Other long term (current) drug therapy: Secondary | ICD-10-CM | POA: Insufficient documentation

## 2012-11-30 DIAGNOSIS — E785 Hyperlipidemia, unspecified: Secondary | ICD-10-CM | POA: Insufficient documentation

## 2012-11-30 DIAGNOSIS — Z89611 Acquired absence of right leg above knee: Secondary | ICD-10-CM

## 2012-11-30 DIAGNOSIS — Z794 Long term (current) use of insulin: Secondary | ICD-10-CM | POA: Diagnosis not present

## 2012-11-30 DIAGNOSIS — G8220 Paraplegia, unspecified: Secondary | ICD-10-CM | POA: Insufficient documentation

## 2012-11-30 DIAGNOSIS — M4120 Other idiopathic scoliosis, site unspecified: Secondary | ICD-10-CM | POA: Diagnosis not present

## 2012-11-30 DIAGNOSIS — S78119A Complete traumatic amputation at level between unspecified hip and knee, initial encounter: Secondary | ICD-10-CM | POA: Diagnosis not present

## 2012-11-30 DIAGNOSIS — I2589 Other forms of chronic ischemic heart disease: Secondary | ICD-10-CM | POA: Diagnosis not present

## 2012-11-30 DIAGNOSIS — M25519 Pain in unspecified shoulder: Secondary | ICD-10-CM | POA: Diagnosis not present

## 2012-11-30 DIAGNOSIS — E119 Type 2 diabetes mellitus without complications: Secondary | ICD-10-CM | POA: Diagnosis not present

## 2012-11-30 MED ORDER — HYDROCODONE-ACETAMINOPHEN 5-325 MG PO TABS: 1.0000 | ORAL_TABLET | Freq: Four times a day (QID) | ORAL | Status: DC | PRN

## 2012-11-30 MED ORDER — NORTRIPTYLINE HCL 10 MG PO CAPS: ORAL_CAPSULE | ORAL | Status: DC

## 2012-11-30 NOTE — Patient Instructions (Signed)
Continue current medications. 

## 2012-11-30 NOTE — Progress Notes (Signed)
Subjective:    Patient ID: Jaime [name], male    DOB: 02-21-1941, 72 y.o.   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 General activity 2 Relation with others 4 Enjoyment of life 2 What TIME of day is your pain at its worst? daytime Sleep (in general) Fair  Pain is worse with: some activites Pain improves with: medication Relief from Meds: 3  Mobility use a wheelchair needs help with transfers  Function retired I need assistance with the following:  dressing, bathing, toileting, meal prep, household duties and shopping  Neuro/Psych bladder control problems spasms  Prior Studies Any changes since last visit?  no  Physicians involved in your care Any changes since last visit?  no   Family History  Problem Relation Age of Onset  . Hypertension    . Heart attack Mother 39  . Heart disease Mother   . Stroke Mother   . Hyperlipidemia Mother   . Hypertension Mother   . Colon cancer Neg Hx   . Prostate cancer Neg Hx   . COPD Father   . Peripheral vascular disease Father   . Diabetes Brother   . Heart disease Brother   . Hypertension Brother   . Heart attack Brother   . Diabetes Daughter    History   Social History  . Marital Status: Married    Spouse Name: N/A    Number of Children: 2  . Years of Education: N/A   Occupational History  . disable    Social History Main Topics  . Smoking status: Former Smoker -- 50 years  . Smokeless tobacco: Former Neurosurgeon    Quit date: 08/05/2009     Comment: pt states that he is using E-cigs  . Alcohol Use: No  . Drug Use: No  . Sexually Active:  None     Comment: electronic cigarettes   Other Topics Concern  . None   Social History Narrative   Lives w/ wife   Past Surgical History  Procedure Laterality Date  . Hernia repair  1990    (R)  . Thoracentesis  2008    PLEURAL EFFUSION  . Cystoscopy  07/30/2011    Procedure: CYSTOSCOPY;  Surgeon: Lindaann Slough, MD;  Location: [facility];  Service: Urology;  Laterality: N/A;  . Transurethral resection of prostate  07/30/2011    Procedure: TRANSURETHRAL RESECTION OF THE PROSTATE (TURP);  Surgeon: Lindaann Slough, MD;  Location: [facility];  Service: Urology;  Laterality: N/A;  . Femoral-tibial bypass graft  03/11/2012    Procedure: BYPASS GRAFT FEMORAL-TIBIAL ARTERY;  Surgeon: Fransisco Hertz, MD;  Location: [facility] OR;  Service: Vascular;  Laterality: Left;  Left Femoral -Tibial trunk bypass, Endarterectomy of Tibial- Peroneal trunk with vein angioplasty.  . Intraoperative arteriogram  03/11/2012    Procedure: INTRA OPERATIVE ARTERIOGRAM;  Surgeon: Fransisco Hertz, MD;  Location: [facility] OR;  Service: Vascular;  Laterality: Left;  . Femoral-popliteal bypass graft  03/11/2012    Procedure: BYPASS GRAFT FEMORAL-POPLITEAL ARTERY;  Surgeon: Fransisco Hertz, MD;  Location: [facility] OR;  Service: Vascular;  Laterality: Left;  embolectomy left lower leg  . Amputation  03/17/2012    Procedure: AMPUTATION ABOVE KNEE;  Surgeon: Fransisco Hertz, MD;  Location: [facility] OR;  Service: Vascular;  Laterality: Left;  . Coronary angioplasty with stent placement  05-01-2006    OCCLUDED CIRCUMFLEX -- TAXUS STENT PLACMENT  AND RESIDUAL 80% LAD,  50% RCA  . Amputation  06/03/2012    Procedure: AMPUTATION ABOVE KNEE;  Surgeon: Fransisco Hertz, MD;  Location: [facility] OR;  Service: Vascular;  Laterality: Right;   Past Medical History  Diagnosis Date  . Hyperlipidemia   . Cardiomyopathy, ischemic     EF 45% per ECHO 2008  //   EF 25%, echo, August, 2013  . Pulmonary hypertension     moderate ECHO Jan 2008  . Systolic  heart failure   . Multiple sclerosis DX 1993    NEUROLOGIST-  DR DOHMEIER -- LAST VISIT 11-20-2010  NOTE W/ CHART  . Lung nodule     resolved 11-2006 CT Chest  . Tobacco abuse   . Increased prostate specific antigen (PSA) velocity   . Urinary retention     dx ~ 2-12, like from MS, now with a catheter, saw urology  . Ejection fraction      EF 45%, echo, 2008  //   EF 25%, echo, August, 2013  . Cellulitis   . Pre-syncope     Near syncope, ER visit, October, 2011  . Carotid artery disease     Doppler, February, 2012, 0-39% bilateral,Mild smooth plaque  . Chronic indwelling foley catheter   . Scoliosis associated with other condition   . Hemiparesis   . Fatigue SEVERE  . Weakness   . Impotence   . MI, acute, non ST segment elevation 2007    S/P PCI WITH X1 STENT (TAXUS DRUG-ELUTING) LEFT CIRCUMFLEX  . Echocardiogram abnormal 08-17-2006    SEVERE HYPOKINESIS OF INFERIOR , POSTEROR WALLS OF BASE AND MID VENTRICLE AND MOD. PULMONARY HTN  . Diabetes mellitus     INSULIN-DEPENDANT  . Hypertension   . H/O pleural effusion 2008    POST THORACENTESIS  . History of colon polyps PRECANCEROUS  . Insomnia   . Ingrown right big toenail CHRONIC    PER PT VERY SORE  . CAD (coronary artery disease) CARDIOLOGIST- DR KATZ-- VISIT 06-05-2011 IN [facility]    non-STEMI, 2007.Marland Kitchenoccluded circumflex.. Taxus stent placed...residual 80% LAD...50% RCA  . Pneumonia     hx of  . Urinary tract infection     hx of  . Atherosclerotic PVD with ulceration     left foot  . S/P AKA (above knee amputation)     left AKA August, 2013, after vascular surgery failed  . Preop cardiovascular exam     Cardiac clearance for possible right AKA, September, 2013  . Neuromuscular disorder     multiple sclerosis  . PAC (premature atrial contraction)     December, 2013   BP 160/99  Pulse 74  Resp 14  Ht 5\' 11"  (1.803 m)  Wt 140 lb (63.504 kg)  BMI 19.53 kg/m2  SpO2 97%     Review of Systems  Musculoskeletal:        Spasm  All other systems reviewed and are negative.       Objective:   Physical Exam  Shoulder pain with range of motion right side greater than left side. Has full range of motion in all directions. Motor strength is 4/5 in both deltoid bicep triceps and unit Lower extremities  status post AKA bilateral       Assessment & Plan:  1. 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  2. Scoliosis without back pain

## 2012-12-23 ENCOUNTER — Ambulatory Visit (HOSPITAL_BASED_OUTPATIENT_CLINIC_OR_DEPARTMENT_OTHER)
Admission: RE | Admit: 2012-12-23 | Discharge: 2012-12-23 | Disposition: A | Discharge status: Home / Self Care | Payer: Medicare Other | Source: Ambulatory Visit | Attending: Internal Medicine | Admitting: Internal Medicine

## 2012-12-23 ENCOUNTER — Encounter: Payer: Self-pay | Admitting: Internal Medicine

## 2012-12-23 ENCOUNTER — Ambulatory Visit (INDEPENDENT_AMBULATORY_CARE_PROVIDER_SITE_OTHER): Payer: Medicare Other | Admitting: Internal Medicine

## 2012-12-23 ENCOUNTER — Other Ambulatory Visit: Payer: Self-pay | Admitting: Internal Medicine

## 2012-12-23 VITALS — BP 132/86 | HR 77 | Temp 97.9°F

## 2012-12-23 DIAGNOSIS — R05 Cough: Secondary | ICD-10-CM | POA: Diagnosis not present

## 2012-12-23 DIAGNOSIS — Z993 Dependence on wheelchair: Secondary | ICD-10-CM | POA: Insufficient documentation

## 2012-12-23 DIAGNOSIS — Z87891 Personal history of nicotine dependence: Secondary | ICD-10-CM | POA: Diagnosis not present

## 2012-12-23 DIAGNOSIS — E785 Hyperlipidemia, unspecified: Secondary | ICD-10-CM | POA: Diagnosis not present

## 2012-12-23 DIAGNOSIS — R059 Cough, unspecified: Secondary | ICD-10-CM | POA: Insufficient documentation

## 2012-12-23 DIAGNOSIS — R0609 Other forms of dyspnea: Secondary | ICD-10-CM | POA: Diagnosis not present

## 2012-12-23 LAB — LIPID PANEL
Cholesterol: 119 mg/dL (ref 0–200)
VLDL: 10.8 mg/dL (ref 0.0–40.0)

## 2012-12-23 LAB — AST: AST: 23 U/L (ref 0–37)

## 2012-12-23 NOTE — Assessment & Plan Note (Signed)
Start Lipitor a few weeks ago, no apparent side effects, labs

## 2012-12-23 NOTE — Progress Notes (Signed)
  Subjective:    Patient ID: Jaime [name], male    DOB: 1940-12-19, 72 y.o.   MRN: [id number]  HPI Acute visit, here with his wife. Chief complaint is chest congestion, this is going on for several weeks, when asked, admits that he cough is daily and frequently. Unable to bring up any sputum. Also, he started Lipitor, no apparent side effects.  Past Medical History:  MS  DM  Hyperlipidemia  Pulmonary hypertension moderate.... echo.... January, 2008  Ischemic cardiomyopathy  CAD non-STEMI.. 2007.Marland Kitchen occluded circumflex... Taxus stent placed.... residual 80% LAD.Marland Kitchen 50% RCA  Peripheral vascular disease  BPH  Lung nodule-- resolved per 11-2006 CT chest   Past Surgical History  Procedure Laterality Date  . Hernia repair  1990    (R)  . Thoracentesis  2008    PLEURAL EFFUSION  . Cystoscopy  07/30/2011    Procedure: CYSTOSCOPY;  Surgeon: Lindaann Slough, MD;  Location: [facility];  Service: Urology;  Laterality: N/A;  . Transurethral resection of prostate  07/30/2011    Procedure: TRANSURETHRAL RESECTION OF THE PROSTATE (TURP);  Surgeon: Lindaann Slough, MD;  Location: [facility];  Service: Urology;  Laterality: N/A;  . Femoral-tibial bypass graft  03/11/2012    Procedure: BYPASS GRAFT FEMORAL-TIBIAL ARTERY;  Surgeon: Fransisco Hertz, MD;  Location: [facility] OR;  Service: Vascular;  Laterality: Left;  Left Femoral -Tibial trunk bypass, Endarterectomy of Tibial- Peroneal trunk with vein angioplasty.  . Intraoperative arteriogram  03/11/2012    Procedure: INTRA OPERATIVE ARTERIOGRAM;  Surgeon: Fransisco Hertz, MD;  Location: [facility] OR;  Service: Vascular;  Laterality: Left;  . Femoral-popliteal bypass graft  03/11/2012    Procedure: BYPASS GRAFT FEMORAL-POPLITEAL ARTERY;  Surgeon: Fransisco Hertz, MD;  Location: [facility] OR;  Service: Vascular;  Laterality: Left;  embolectomy left lower leg  . Amputation  03/17/2012    Procedure: AMPUTATION ABOVE KNEE;  Surgeon: Fransisco Hertz, MD;  Location:  [facility] OR;  Service: Vascular;  Laterality: Left;  . Coronary angioplasty with stent placement  05-01-2006    OCCLUDED CIRCUMFLEX -- TAXUS STENT PLACMENT  AND RESIDUAL 80% LAD,  50% RCA  . Amputation  06/03/2012    Procedure: AMPUTATION ABOVE KNEE;  Surgeon: Fransisco Hertz, MD;  Location: [facility] OR;  Service: Vascular;  Laterality: Right;   Social History:  Married, children x 2  As of 4-14 help w/ bath and nurse visits ([employer])  Current Smoker, quit 02-2012, occ E-cigarret  Alcohol use-no    Review of Systems No fever or chills No sinus congestion, pain or discharge. Mobility is quite limited. Denies wheezing.     Objective:   Physical Exam BP 132/86  Pulse 77  Temp(Src) 97.9 F (36.6 C) (Oral)  SpO2 95%  General -- alert, well-developed, sits in his scooter, mobility is quite limited .    Lungs -- exam limited by poor poor mobility. Decreased breath sounds on the left. Decreased breath sounds on the right as well, few dry crackles at the right base? (may be positional, his torso is tilted to the right). No wheezing. Neurologic-- alert & oriented X3  Psych-- Cognition and judgment appear intact. Alert and cooperative with normal attention span and concentration.  not anxious appearing and not depressed appearing.       Assessment & Plan:

## 2012-12-23 NOTE — Patient Instructions (Addendum)
Get the XR at [facility], corner of [address] (10 minutes form here); they are open 24/7 [address]  [place], [place] [address] [phone] --- Carlos American twice a day mucinex DM twice a day as needed CPT twice a day If tudorza seems to help,please call for a prescription.

## 2012-12-23 NOTE — Assessment & Plan Note (Addendum)
Cough for few weeks. The patient is a former smoker. X-ray 06/2012 showed COPD changes. Suspect cough is related to COPD and inability to clear secretions; exam is limited  due to to position and poor inspiration. He is afebrile and in no distress. Plan:  Start treatment with Tudorza, samples provided  Chest x-ray Mucinex DM CPT Wife will call and let us know how he is doing

## 2013-02-02 ENCOUNTER — Other Ambulatory Visit: Payer: Self-pay

## 2013-02-02 MED ORDER — BACLOFEN 10 MG PO TABS: 5.0000 mg | ORAL_TABLET | Freq: Two times a day (BID) | ORAL | Status: DC

## 2013-02-02 MED ORDER — NORTRIPTYLINE HCL 10 MG PO CAPS: ORAL_CAPSULE | ORAL | Status: DC

## 2013-02-24 ENCOUNTER — Other Ambulatory Visit: Payer: Self-pay

## 2013-03-02 ENCOUNTER — Ambulatory Visit: Payer: Medicare Other | Admitting: Physical Medicine & Rehabilitation

## 2013-03-05 ENCOUNTER — Encounter: Payer: Medicare Other | Attending: Physical Medicine & Rehabilitation

## 2013-03-05 ENCOUNTER — Ambulatory Visit (HOSPITAL_BASED_OUTPATIENT_CLINIC_OR_DEPARTMENT_OTHER): Payer: Medicare Other | Admitting: Physical Medicine & Rehabilitation

## 2013-03-05 ENCOUNTER — Encounter: Payer: Self-pay | Admitting: Physical Medicine & Rehabilitation

## 2013-03-05 VITALS — BP 143/87 | HR 75 | Resp 16

## 2013-03-05 DIAGNOSIS — G35 Multiple sclerosis: Secondary | ICD-10-CM | POA: Diagnosis not present

## 2013-03-05 DIAGNOSIS — G8220 Paraplegia, unspecified: Secondary | ICD-10-CM | POA: Insufficient documentation

## 2013-03-05 DIAGNOSIS — Z79899 Other long term (current) drug therapy: Secondary | ICD-10-CM | POA: Insufficient documentation

## 2013-03-05 DIAGNOSIS — E785 Hyperlipidemia, unspecified: Secondary | ICD-10-CM | POA: Insufficient documentation

## 2013-03-05 DIAGNOSIS — G547 Phantom limb syndrome without pain: Secondary | ICD-10-CM

## 2013-03-05 DIAGNOSIS — Z5181 Encounter for therapeutic drug level monitoring: Secondary | ICD-10-CM | POA: Diagnosis not present

## 2013-03-05 DIAGNOSIS — I2589 Other forms of chronic ischemic heart disease: Secondary | ICD-10-CM | POA: Diagnosis not present

## 2013-03-05 DIAGNOSIS — I10 Essential (primary) hypertension: Secondary | ICD-10-CM | POA: Diagnosis not present

## 2013-03-05 DIAGNOSIS — E119 Type 2 diabetes mellitus without complications: Secondary | ICD-10-CM | POA: Insufficient documentation

## 2013-03-05 DIAGNOSIS — M4120 Other idiopathic scoliosis, site unspecified: Secondary | ICD-10-CM | POA: Diagnosis not present

## 2013-03-05 DIAGNOSIS — M25519 Pain in unspecified shoulder: Secondary | ICD-10-CM | POA: Diagnosis not present

## 2013-03-05 DIAGNOSIS — Z794 Long term (current) use of insulin: Secondary | ICD-10-CM | POA: Insufficient documentation

## 2013-03-05 DIAGNOSIS — S78119A Complete traumatic amputation at level between unspecified hip and knee, initial encounter: Secondary | ICD-10-CM | POA: Insufficient documentation

## 2013-03-05 DIAGNOSIS — M7500 Adhesive capsulitis of unspecified shoulder: Secondary | ICD-10-CM

## 2013-03-05 DIAGNOSIS — M7501 Adhesive capsulitis of right shoulder: Secondary | ICD-10-CM

## 2013-03-05 NOTE — Progress Notes (Signed)
Subjective:    Patient ID: Jaime [name], male    DOB: Oct 26, 1940, 72 y.o.   MRN: [id number]  HPI Right shoulder pain for two weeks no fall or trauma.  Short term relief with voltaren Also has skin area on back that wife is concerned about. Shoulder pain has not been relieved by hydrocodone. Very limited in terms mobility secondary to multiple sclerosis as well as bilateral above knee amputations Pain Inventory Average Pain 5 Pain Right Now na My pain is sharp  In the last 24 hours, has pain interfered with the following? General activity 2 Relation with others 10 Enjoyment of life 5 What TIME of day is your pain at its worst? any time Sleep (in general) Fair  Pain is worse with: na Pain improves with: na Relief from Meds: na  Mobility how many minutes can you walk? 0 ability to climb steps?  no do you drive?  no use a wheelchair needs help with transfers  Function disabled: date disabled 1994 Do you have any goals in this area?  no  Neuro/Psych bladder control problems bowel control problems weakness  Prior Studies Any changes since last visit?  no  Physicians involved in your care Any changes since last visit?  yes Primary care Dr.Jose [name]   Family History  Problem Relation Age of Onset  . Hypertension    . Heart attack Mother 58  . Heart disease Mother   . Stroke Mother   . Hyperlipidemia Mother   . Hypertension Mother   . Colon cancer Neg Hx   . Prostate cancer Neg Hx   . COPD Father   . Peripheral vascular disease Father   . Diabetes Brother   . Heart disease Brother   . Hypertension Brother   . Heart attack Brother   . Diabetes Daughter    History   Social History  . Marital Status: Married    Spouse Name: N/A    Number of Children: 2  . Years of Education: N/A   Occupational History  . disable    Social History Main Topics  . Smoking status: Former Smoker -- 50 years  . Smokeless tobacco: Former Neurosurgeon    Quit date: 08/05/2009      Comment: pt states that he is using E-cigs  . Alcohol Use: No  . Drug Use: No  . Sexual Activity: None     Comment: electronic cigarettes   Other Topics Concern  . None   Social History Narrative   Lives w/ wife   Past Surgical History  Procedure Laterality Date  . Hernia repair  1990    (R)  . Thoracentesis  2008    PLEURAL EFFUSION  . Cystoscopy  07/30/2011    Procedure: CYSTOSCOPY;  Surgeon: Lindaann Slough, MD;  Location: [facility];  Service: Urology;  Laterality: N/A;  . Transurethral resection of prostate  07/30/2011    Procedure: TRANSURETHRAL RESECTION OF THE PROSTATE (TURP);  Surgeon: Lindaann Slough, MD;  Location: [facility];  Service: Urology;  Laterality: N/A;  . Femoral-tibial bypass graft  03/11/2012    Procedure: BYPASS GRAFT FEMORAL-TIBIAL ARTERY;  Surgeon: Fransisco Hertz, MD;  Location: [facility] OR;  Service: Vascular;  Laterality: Left;  Left Femoral -Tibial trunk bypass, Endarterectomy of Tibial- Peroneal trunk with vein angioplasty.  . Intraoperative arteriogram  03/11/2012    Procedure: INTRA OPERATIVE ARTERIOGRAM;  Surgeon: Fransisco Hertz, MD;  Location: [facility] OR;  Service: Vascular;  Laterality: Left;  .  Femoral-popliteal bypass graft  03/11/2012    Procedure: BYPASS GRAFT FEMORAL-POPLITEAL ARTERY;  Surgeon: Fransisco Hertz, MD;  Location: [facility] OR;  Service: Vascular;  Laterality: Left;  embolectomy left lower leg  . Amputation  03/17/2012    Procedure: AMPUTATION ABOVE KNEE;  Surgeon: Fransisco Hertz, MD;  Location: [facility] OR;  Service: Vascular;  Laterality: Left;  . Coronary angioplasty with stent placement  05-01-2006    OCCLUDED CIRCUMFLEX -- TAXUS STENT PLACMENT  AND RESIDUAL 80% LAD,  50% RCA  . Amputation  06/03/2012    Procedure: AMPUTATION ABOVE KNEE;  Surgeon: Fransisco Hertz, MD;  Location: [facility] OR;  Service: Vascular;  Laterality: Right;   Past Medical History  Diagnosis Date  . Hyperlipidemia   . Cardiomyopathy, ischemic     EF 45% per ECHO  2008  //   EF 25%, echo, August, 2013  . Pulmonary hypertension     moderate ECHO Jan 2008  . Systolic heart failure   . Multiple sclerosis DX 1993    NEUROLOGIST-  DR DOHMEIER -- LAST VISIT 11-20-2010  NOTE W/ CHART  . Lung nodule     resolved 11-2006 CT Chest  . Tobacco abuse   . Increased prostate specific antigen (PSA) velocity   . Urinary retention     dx ~ 2-12, like from MS, now with a catheter, saw urology  . Ejection fraction      EF 45%, echo, 2008  //   EF 25%, echo, August, 2013  . Cellulitis   . Pre-syncope     Near syncope, ER visit, October, 2011  . Carotid artery disease     Doppler, February, 2012, 0-39% bilateral,Mild smooth plaque  . Chronic indwelling Foley catheter   . Scoliosis associated with other condition   . Hemiparesis   . Fatigue SEVERE  . Weakness   . Impotence   . MI, acute, non ST segment elevation 2007    S/P PCI WITH X1 STENT (TAXUS DRUG-ELUTING) LEFT CIRCUMFLEX  . Echocardiogram abnormal 08-17-2006    SEVERE HYPOKINESIS OF INFERIOR , POSTEROR WALLS OF BASE AND MID VENTRICLE AND MOD. PULMONARY HTN  . Diabetes mellitus     INSULIN-DEPENDANT  . Hypertension   . H/O pleural effusion 2008    POST THORACENTESIS  . History of colon polyps PRECANCEROUS  . Insomnia   . Ingrown right big toenail CHRONIC    PER PT VERY SORE  . CAD (coronary artery disease) CARDIOLOGIST- DR KATZ-- VISIT 06-05-2011 IN [facility]    non-STEMI, 2007.Marland Kitchenoccluded circumflex.. Taxus stent placed...residual 80% LAD...50% RCA  . Pneumonia     hx of  . Urinary tract infection     hx of  . Atherosclerotic PVD with ulceration     left foot  . S/P AKA (above knee amputation)     left AKA August, 2013, after vascular surgery failed  . Preop cardiovascular exam     Cardiac clearance for possible right AKA, September, 2013  . Neuromuscular disorder     multiple sclerosis  . PAC (premature atrial contraction)     December, 2013   BP 143/87  Pulse 75  Resp 16  SpO2  95%     Review of Systems  Constitutional: Positive for appetite change.  Genitourinary: Positive for difficulty urinating.  Skin: Positive for rash.  Neurological: Positive for weakness.  All other systems reviewed and are negative.       Objective:   Physical Exam Pain with abduction forward flexion external and  internal rotation of the right shoulder Skin shows maculopapular mildly erythematous rash on back. No open areas  Upper extremity strength 4 minus at the biceps triceps grip bilaterally. 3 minus at the deltoids related to pain and limitation in range of motion Mood and affect are appropriate       Assessment & Plan:  1. Multiple sclerosis wheelchair bound for several years 2. 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 Recommend shoulder injection 3. 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 Severe thoracic and  Lumbar scoliosis  Shoulder injection Right  Indication:R Shoulder pain not relieved by medication management and other conservative care.  Informed consent was obtained after describing risks and benefits of the procedure with the patient, this includes bleeding, bruising, infection and medication side effects. The patient wishes to proceed and has given written consent. Patient was placed in a seated position. TheRshoulder was marked and prepped with betadine in the subacromial area. A 25-gauge 1-1/2 inch needle was inserted into the subacromial area. After negative draw back for blood, a solution containing 1 mL of 40 mg per ML depomedrol and 3 mL of 1% lidocaine was injected. A band aid was applied. The patient tolerated the procedure well. Post procedure instructions were given.

## 2013-03-05 NOTE — Patient Instructions (Signed)
Call for another injection in 1 month if shoulders not much better

## 2013-04-20 ENCOUNTER — Other Ambulatory Visit: Payer: Self-pay | Admitting: Cardiology

## 2013-04-27 ENCOUNTER — Ambulatory Visit: Payer: Medicare Other | Admitting: Internal Medicine

## 2013-05-04 ENCOUNTER — Encounter: Payer: Self-pay | Admitting: Physical Medicine & Rehabilitation

## 2013-05-04 ENCOUNTER — Encounter: Payer: Medicare Other | Attending: Physical Medicine & Rehabilitation

## 2013-05-04 ENCOUNTER — Ambulatory Visit (HOSPITAL_BASED_OUTPATIENT_CLINIC_OR_DEPARTMENT_OTHER): Payer: Medicare Other | Admitting: Physical Medicine & Rehabilitation

## 2013-05-04 VITALS — BP 127/67 | HR 87 | Resp 14 | Ht 71.0 in

## 2013-05-04 DIAGNOSIS — I10 Essential (primary) hypertension: Secondary | ICD-10-CM | POA: Insufficient documentation

## 2013-05-04 DIAGNOSIS — Z79899 Other long term (current) drug therapy: Secondary | ICD-10-CM | POA: Insufficient documentation

## 2013-05-04 DIAGNOSIS — G547 Phantom limb syndrome without pain: Secondary | ICD-10-CM | POA: Insufficient documentation

## 2013-05-04 DIAGNOSIS — I2589 Other forms of chronic ischemic heart disease: Secondary | ICD-10-CM | POA: Insufficient documentation

## 2013-05-04 DIAGNOSIS — G35 Multiple sclerosis: Secondary | ICD-10-CM | POA: Diagnosis not present

## 2013-05-04 DIAGNOSIS — Z89611 Acquired absence of right leg above knee: Secondary | ICD-10-CM

## 2013-05-04 DIAGNOSIS — Z794 Long term (current) use of insulin: Secondary | ICD-10-CM | POA: Insufficient documentation

## 2013-05-04 DIAGNOSIS — S78119A Complete traumatic amputation at level between unspecified hip and knee, initial encounter: Secondary | ICD-10-CM | POA: Insufficient documentation

## 2013-05-04 DIAGNOSIS — E119 Type 2 diabetes mellitus without complications: Secondary | ICD-10-CM | POA: Diagnosis not present

## 2013-05-04 DIAGNOSIS — M25519 Pain in unspecified shoulder: Secondary | ICD-10-CM | POA: Insufficient documentation

## 2013-05-04 DIAGNOSIS — G8220 Paraplegia, unspecified: Secondary | ICD-10-CM | POA: Diagnosis not present

## 2013-05-04 DIAGNOSIS — E785 Hyperlipidemia, unspecified: Secondary | ICD-10-CM | POA: Diagnosis not present

## 2013-05-04 DIAGNOSIS — M4120 Other idiopathic scoliosis, site unspecified: Secondary | ICD-10-CM | POA: Insufficient documentation

## 2013-05-04 DIAGNOSIS — G546 Phantom limb syndrome with pain: Secondary | ICD-10-CM

## 2013-05-04 DIAGNOSIS — M75100 Unspecified rotator cuff tear or rupture of unspecified shoulder, not specified as traumatic: Secondary | ICD-10-CM | POA: Diagnosis not present

## 2013-05-04 DIAGNOSIS — M7542 Impingement syndrome of left shoulder: Secondary | ICD-10-CM

## 2013-05-04 MED ORDER — ACETAMINOPHEN-CODEINE #3 300-30 MG PO TABS: 1.0000 | ORAL_TABLET | ORAL | Status: DC | PRN

## 2013-05-04 MED ORDER — BACLOFEN 10 MG PO TABS: 5.0000 mg | ORAL_TABLET | Freq: Two times a day (BID) | ORAL | Status: DC

## 2013-05-04 NOTE — Patient Instructions (Signed)
Please use 3 foot long stick hold with both hands in a flat position in bed and slowly raise overhead.10 repetitions twice a day

## 2013-05-04 NOTE — Progress Notes (Signed)
Subjective:    Patient ID: Jaime [name], male    DOB: 06-16-1941, 72 y.o.   MRN: [id number]  HPI Right shoulder injection in August was very helpful for right shoulder pain. Now has developed left shoulder pain which is severe. Does not respond to hydrocodone.  Wheelchair bound bilateral AKA with occasional stump pain.  MS symptoms have been stable.  Pain Inventory Average Pain 0 Pain Right Now 0 My pain is intermittent  In the last 24 hours, has pain interfered with the following? General activity 1 Relation with others 7 Enjoyment of life 1 What TIME of day is your pain at its worst? varies Sleep (in general) Fair  Pain is worse with: . Pain improves with: medication and injections Relief from Meds: 1  Mobility how many minutes can you walk?  does not walk ability to climb steps?  no do you drive?  no use a wheelchair needs help with transfers  Function disabled: date disabled . I need assistance with the following:  dressing, bathing, toileting, meal prep, household duties and shopping  Neuro/Psych bladder control problems bowel control problems weakness  Prior Studies Any changes since last visit?  no  Physicians involved in your care Any changes since last visit?  no   Family History  Problem Relation Age of Onset  . Hypertension    . Heart attack Mother 52  . Heart disease Mother   . Stroke Mother   . Hyperlipidemia Mother   . Hypertension Mother   . Colon cancer Neg Hx   . Prostate cancer Neg Hx   . COPD Father   . Peripheral vascular disease Father   . Diabetes Brother   . Heart disease Brother   . Hypertension Brother   . Heart attack Brother   . Diabetes Daughter    History   Social History  . Marital Status: Married    Spouse Name: N/A    Number of Children: 2  . Years of Education: N/A   Occupational History  . disable    Social History Main Topics  . Smoking status: Former Smoker -- 50 years  . Smokeless tobacco:  Former Neurosurgeon    Quit date: 08/05/2009     Comment: pt states that he is using E-cigs  . Alcohol Use: No  . Drug Use: No  . Sexual Activity: None     Comment: electronic cigarettes   Other Topics Concern  . None   Social History Narrative   Lives w/ wife   Past Surgical History  Procedure Laterality Date  . Hernia repair  1990    (R)  . Thoracentesis  2008    PLEURAL EFFUSION  . Cystoscopy  07/30/2011    Procedure: CYSTOSCOPY;  Surgeon: Lindaann Slough, MD;  Location: [facility];  Service: Urology;  Laterality: N/A;  . Transurethral resection of prostate  07/30/2011    Procedure: TRANSURETHRAL RESECTION OF THE PROSTATE (TURP);  Surgeon: Lindaann Slough, MD;  Location: [facility];  Service: Urology;  Laterality: N/A;  . Femoral-tibial bypass graft  03/11/2012    Procedure: BYPASS GRAFT FEMORAL-TIBIAL ARTERY;  Surgeon: Fransisco Hertz, MD;  Location: [facility] OR;  Service: Vascular;  Laterality: Left;  Left Femoral -Tibial trunk bypass, Endarterectomy of Tibial- Peroneal trunk with vein angioplasty.  . Intraoperative arteriogram  03/11/2012    Procedure: INTRA OPERATIVE ARTERIOGRAM;  Surgeon: Fransisco Hertz, MD;  Location: [facility] OR;  Service: Vascular;  Laterality: Left;  . Femoral-popliteal bypass graft  03/11/2012    Procedure: BYPASS GRAFT FEMORAL-POPLITEAL ARTERY;  Surgeon: Fransisco Hertz, MD;  Location: [facility] OR;  Service: Vascular;  Laterality: Left;  embolectomy left lower leg  . Amputation  03/17/2012    Procedure: AMPUTATION ABOVE KNEE;  Surgeon: Fransisco Hertz, MD;  Location: [facility] OR;  Service: Vascular;  Laterality: Left;  . Coronary angioplasty with stent placement  05-01-2006    OCCLUDED CIRCUMFLEX -- TAXUS STENT PLACMENT  AND RESIDUAL 80% LAD,  50% RCA  . Amputation  06/03/2012    Procedure: AMPUTATION ABOVE KNEE;  Surgeon: Fransisco Hertz, MD;  Location: [facility] OR;  Service: Vascular;  Laterality: Right;   Past Medical History  Diagnosis Date  . Hyperlipidemia   .  Cardiomyopathy, ischemic     EF 45% per ECHO 2008  //   EF 25%, echo, August, 2013  . Pulmonary hypertension     moderate ECHO Jan 2008  . Systolic heart failure   . Multiple sclerosis DX 1993    NEUROLOGIST-  DR DOHMEIER -- LAST VISIT 11-20-2010  NOTE W/ CHART  . Lung nodule     resolved 11-2006 CT Chest  . Tobacco abuse   . Increased prostate specific antigen (PSA) velocity   . Urinary retention     dx ~ 2-12, like from MS, now with a catheter, saw urology  . Ejection fraction      EF 45%, echo, 2008  //   EF 25%, echo, August, 2013  . Cellulitis   . Pre-syncope     Near syncope, ER visit, October, 2011  . Carotid artery disease     Doppler, February, 2012, 0-39% bilateral,Mild smooth plaque  . Chronic indwelling Foley catheter   . Scoliosis associated with other condition   . Hemiparesis   . Fatigue SEVERE  . Weakness   . Impotence   . MI, acute, non ST segment elevation 2007    S/P PCI WITH X1 STENT (TAXUS DRUG-ELUTING) LEFT CIRCUMFLEX  . Echocardiogram abnormal 08-17-2006    SEVERE HYPOKINESIS OF INFERIOR , POSTEROR WALLS OF BASE AND MID VENTRICLE AND MOD. PULMONARY HTN  . Diabetes mellitus     INSULIN-DEPENDANT  . Hypertension   . H/O pleural effusion 2008    POST THORACENTESIS  . History of colon polyps PRECANCEROUS  . Insomnia   . Ingrown right big toenail CHRONIC    PER PT VERY SORE  . CAD (coronary artery disease) CARDIOLOGIST- DR KATZ-- VISIT 06-05-2011 IN [facility]    non-STEMI, 2007.Marland Kitchenoccluded circumflex.. Taxus stent placed...residual 80% LAD...50% RCA  . Pneumonia     hx of  . Urinary tract infection     hx of  . Atherosclerotic PVD with ulceration     left foot  . S/P AKA (above knee amputation)     left AKA August, 2013, after vascular surgery failed  . Preop cardiovascular exam     Cardiac clearance for possible right AKA, September, 2013  . Neuromuscular disorder     multiple sclerosis  . PAC (premature atrial contraction)     December, 2013   BP  127/67  Pulse 87  Resp 14  Ht 5\' 11"  (1.803 m)  SpO2 96%   Review of Systems  Constitutional: Positive for appetite change.  Gastrointestinal:       Bowel Control Problems  Genitourinary:       Bladder control problems  All other systems reviewed and are negative.       Objective:   Physical Exam  Positive impingement sign left shoulder at 90 Positive Hawkins maneuver Motor strength 4 minus bilateral deltoid, 4/5 bilateral biceps triceps and grip. Neck range of motion 75% range lateral bending and rotation     Assessment & Plan:  1. Multiple sclerosis with history of paraplegia is wheelchair bound secondary to this as well as bilateral AKA from peripheral vascular disease.  Pt prescribed gabapentin but takes only prn 2. Scoliosis thoracic and lumbar, degenerative TAKES OCCASIONAL HYDROCODONE, WILL TRIAL TYLENOL #3 3. Right frozen shoulder improved symptomatically after injection #4. Left subacromial bursitis  Shoulder injection Right   Indication:LeftShoulder pain secondary to subacromial impingement syndrome not relieved by medication management and other conservative care.  Informed consent was obtained after describing risks and benefits of the procedure with the patient, this includes bleeding, bruising, infection and medication side effects. The patient wishes to proceed and has given written consent. Patient was placed in a seated position. The Left shoulder was marked and prepped with betadine in the subacromial area. A 25-gauge 1-1/2 inch needle was inserted into the subacromial area. After negative draw back for blood, a solution containing 1 mL of 6 mg per ML celestone and 3 mL of 1% lidocaine was injected. A band aid was applied. The patient tolerated the procedure well. Post procedure instructions were given.

## 2013-05-12 ENCOUNTER — Ambulatory Visit (INDEPENDENT_AMBULATORY_CARE_PROVIDER_SITE_OTHER): Payer: Medicare Other | Admitting: Internal Medicine

## 2013-05-12 ENCOUNTER — Encounter: Payer: Self-pay | Admitting: Internal Medicine

## 2013-05-12 VITALS — BP 110/89 | HR 68 | Temp 98.3°F

## 2013-05-12 DIAGNOSIS — I10 Essential (primary) hypertension: Secondary | ICD-10-CM | POA: Diagnosis not present

## 2013-05-12 DIAGNOSIS — E119 Type 2 diabetes mellitus without complications: Secondary | ICD-10-CM

## 2013-05-12 DIAGNOSIS — R05 Cough: Secondary | ICD-10-CM

## 2013-05-12 DIAGNOSIS — I2510 Atherosclerotic heart disease of native coronary artery without angina pectoris: Secondary | ICD-10-CM | POA: Diagnosis not present

## 2013-05-12 DIAGNOSIS — G546 Phantom limb syndrome with pain: Secondary | ICD-10-CM

## 2013-05-12 DIAGNOSIS — Z0000 Encounter for general adult medical examination without abnormal findings: Secondary | ICD-10-CM | POA: Insufficient documentation

## 2013-05-12 DIAGNOSIS — I509 Heart failure, unspecified: Secondary | ICD-10-CM

## 2013-05-12 DIAGNOSIS — G547 Phantom limb syndrome without pain: Secondary | ICD-10-CM

## 2013-05-12 LAB — BASIC METABOLIC PANEL
BUN: 16 mg/dL (ref 6–23)
CO2: 27 mEq/L (ref 19–32)
Chloride: 102 mEq/L (ref 96–112)
Creatinine, Ser: 0.5 mg/dL (ref 0.4–1.5)
Potassium: 4 mEq/L (ref 3.5–5.1)

## 2013-05-12 LAB — CBC WITH DIFFERENTIAL/PLATELET
Basophils Relative: 0.4 % (ref 0.0–3.0)
Eosinophils Absolute: 0.2 10*3/uL (ref 0.0–0.7)
Eosinophils Relative: 2.9 % (ref 0.0–5.0)
HCT: 44.9 % (ref 39.0–52.0)
Lymphs Abs: 2 10*3/uL (ref 0.7–4.0)
MCHC: 32.5 g/dL (ref 30.0–36.0)
MCV: 81.9 fl (ref 78.0–100.0)
Monocytes Absolute: 0.6 10*3/uL (ref 0.1–1.0)
Neutrophils Relative %: 60 % (ref 43.0–77.0)
RBC: 5.47 Mil/uL (ref 4.22–5.81)

## 2013-05-12 MED ORDER — BENAZEPRIL HCL 10 MG PO TABS: ORAL_TABLET | ORAL | Status: DC

## 2013-05-12 MED ORDER — ATORVASTATIN CALCIUM 40 MG PO TABS: 40.0000 mg | ORAL_TABLET | Freq: Every day | ORAL | Status: DC

## 2013-05-12 MED ORDER — METOPROLOL SUCCINATE ER 25 MG PO TB24: 37.5000 mg | ORAL_TABLET | Freq: Every day | ORAL | Status: DC

## 2013-05-12 MED ORDER — PANTOPRAZOLE SODIUM 40 MG PO TBEC: DELAYED_RELEASE_TABLET | ORAL | Status: DC

## 2013-05-12 NOTE — Assessment & Plan Note (Signed)
Well-controlled, no change 

## 2013-05-12 NOTE — Assessment & Plan Note (Signed)
Used tudorza temporarily but has been off for a while, cough resolved

## 2013-05-12 NOTE — Assessment & Plan Note (Addendum)
Per Dr Lisabeth Devoid, has an appointment next month

## 2013-05-12 NOTE — Assessment & Plan Note (Signed)
Asymptomatic, controlling cardiovascular risk factors. Last cholesterol satisfactorily

## 2013-05-12 NOTE — Assessment & Plan Note (Signed)
Patient was prescribed Tylenol No. 3 which works better than hydrocodone, hydrocodone discontinued from his medication list

## 2013-05-12 NOTE — Assessment & Plan Note (Signed)
Seems to be stable, continue with present care, check a BMP

## 2013-05-12 NOTE — Assessment & Plan Note (Signed)
Flu shot--declined, got very ill with the flu shot years ago and was told never to take it again.

## 2013-05-12 NOTE — Progress Notes (Signed)
Subjective:    Patient ID: Jaime [name], male    DOB: 12/31/1940, 72 y.o.   MRN: [id number]  HPI Routine office visit Was seen with cough, prescribed Carlos American, use that medication temporarily but now cough is gone. phantom pain--currently taking Tylenol #3 which works better than hydrocodone. Hypertension-- on lotensin, amb BPs 120/80s  Past Medical History  Diagnosis Date  . Hyperlipidemia   . Cardiomyopathy, ischemic     EF 45% per ECHO 2008  //   EF 25%, echo, August, 2013  . Pulmonary hypertension     moderate ECHO Jan 2008  . Systolic heart failure   . Multiple sclerosis DX 1993    NEUROLOGIST-  DR DOHMEIER -- LAST VISIT 11-20-2010  NOTE W/ CHART  . Lung nodule     resolved 11-2006 CT Chest  . Tobacco abuse   . Increased prostate specific antigen (PSA) velocity   . Urinary retention     dx ~ 2-12, like from [place], now with a catheter, saw urology  . Carotid artery disease     Doppler, February, 2012, 0-39% bilateral,Mild smooth plaque  . Chronic indwelling Foley catheter   . Scoliosis associated with other condition   . Hemiparesis   . Fatigue SEVERE  . Impotence   . MI, acute, non ST segment elevation 2007    S/P PCI WITH X1 STENT (TAXUS DRUG-ELUTING) LEFT CIRCUMFLEX  . Diabetes mellitus     INSULIN-DEPENDANT  . Hypertension   . H/O pleural effusion 2008    POST THORACENTESIS  . History of colon polyps PRECANCEROUS  . Insomnia   . CAD (coronary artery disease) CARDIOLOGIST- DR KATZ-- VISIT 06-05-2011 IN EPIC    non-STEMI, 2007.Marland Kitchenoccluded circumflex.. Taxus stent placed...residual 80% LAD...50% RCA  . Urinary tract infection     hx of  . Atherosclerotic PVD with ulceration     left foot  . PAC (premature atrial contraction)     December, 2013   Past Surgical History  Procedure Laterality Date  . Hernia repair  1990    (R)  . Thoracentesis  2008    PLEURAL EFFUSION  . Cystoscopy  07/30/2011    Procedure: CYSTOSCOPY;  Surgeon: Lindaann Slough, MD;  Location:  [facility];  Service: Urology;  Laterality: N/A;  . Transurethral resection of prostate  07/30/2011    Procedure: TRANSURETHRAL RESECTION OF THE PROSTATE (TURP);  Surgeon: Lindaann Slough, MD;  Location: [facility];  Service: Urology;  Laterality: N/A;  . Femoral-tibial bypass graft  03/11/2012    Procedure: BYPASS GRAFT FEMORAL-TIBIAL ARTERY;  Surgeon: Fransisco Hertz, MD;  Location: [facility] OR;  Service: Vascular;  Laterality: Left;  Left Femoral -Tibial trunk bypass, Endarterectomy of Tibial- Peroneal trunk with vein angioplasty.  . Intraoperative arteriogram  03/11/2012    Procedure: INTRA OPERATIVE ARTERIOGRAM;  Surgeon: Fransisco Hertz, MD;  Location: [facility] OR;  Service: Vascular;  Laterality: Left;  . Femoral-popliteal bypass graft  03/11/2012    Procedure: BYPASS GRAFT FEMORAL-POPLITEAL ARTERY;  Surgeon: Fransisco Hertz, MD;  Location: [facility] OR;  Service: Vascular;  Laterality: Left;  embolectomy left lower leg  . Amputation  03/17/2012    Procedure: AMPUTATION ABOVE KNEE;  Surgeon: Fransisco Hertz, MD;  Location: [facility] OR;  Service: Vascular;  Laterality: Left;  . Coronary angioplasty with stent placement  05-01-2006    OCCLUDED CIRCUMFLEX -- TAXUS STENT PLACMENT  AND RESIDUAL 80% LAD,  50% RCA  . Amputation  06/03/2012    Procedure: AMPUTATION  ABOVE KNEE;  Surgeon: Fransisco Hertz, MD;  Location[facility] OR;  Service: Vascular;  Laterality: Right;      History   Social History  . Marital Status: Married    Spouse Name: N/A    Number of Children: 2  . Years of Education: N/A   Occupational History  . disable    Social History Main Topics  . Smoking status: Former Smoker -- 50 years  . Smokeless tobacco: Former Neurosurgeon    Quit date: 08/05/2009     Comment: pt states that he is using E-cigs  . Alcohol Use: No  . Drug Use: No  . Sexual Activity: Not on file     Comment: electronic cigarettes   Other Topics Concern  . Not on file   Social History Narrative   Lives w/ wife     Review of Systems No  CP, SOB Denies  nausea, vomiting diarrhea Denies  blood in the stools  No anxiety, depression      Objective:   Physical Exam BP 110/89  Pulse 68  Temp(Src) 98.3 F (36.8 C)  SpO2 95% General -- alert, well-developed, NAD.   Lungs -- normal respiratory effort, no intercostal retractions, no accessory muscle use, and normal breath sounds.  Heart-- normal rate, regular rhythm, no murmur.  Neurologic--  alert & oriented X3. Speech normal  Psych-- Cognition and judgment appear intact. Cooperative with normal attention span and concentration. No anxious appearing , no depressed appearing. In good spirits      Assessment & Plan:

## 2013-05-24 DIAGNOSIS — N139 Obstructive and reflux uropathy, unspecified: Secondary | ICD-10-CM | POA: Diagnosis not present

## 2013-05-24 DIAGNOSIS — N401 Enlarged prostate with lower urinary tract symptoms: Secondary | ICD-10-CM | POA: Diagnosis not present

## 2013-05-27 ENCOUNTER — Other Ambulatory Visit: Payer: Self-pay

## 2013-05-31 ENCOUNTER — Ambulatory Visit: Payer: Medicare Other | Admitting: Physical Medicine & Rehabilitation

## 2013-06-08 ENCOUNTER — Other Ambulatory Visit: Payer: Self-pay | Admitting: Internal Medicine

## 2013-06-08 ENCOUNTER — Other Ambulatory Visit: Payer: Self-pay | Admitting: Cardiology

## 2013-06-08 NOTE — Telephone Encounter (Signed)
Pantoprazole refilled. 

## 2013-06-12 ENCOUNTER — Other Ambulatory Visit: Payer: Self-pay | Admitting: Neurology

## 2013-06-23 ENCOUNTER — Telehealth: Payer: Self-pay | Admitting: Internal Medicine

## 2013-06-23 DIAGNOSIS — Z789 Other specified health status: Secondary | ICD-10-CM

## 2013-06-23 NOTE — Telephone Encounter (Addendum)
Patient wife called and stated that Jaime [name] needs assistant from [facility] to assist with his skin breaking down. He also needs briefs,bandaids, deposable bed pads and a nurse.   [facility] Fax number [phone]

## 2013-06-24 NOTE — Addendum Note (Signed)
Addended by: Eustace Quail on: 06/24/2013 02:51 PM   Modules accepted: Orders

## 2013-06-24 NOTE — Telephone Encounter (Signed)
Jaime [name], Terrence Dupont

## 2013-06-24 NOTE — Telephone Encounter (Signed)
Please contact [facility], send whatever prescription is needed

## 2013-06-27 DIAGNOSIS — N400 Enlarged prostate without lower urinary tract symptoms: Secondary | ICD-10-CM | POA: Diagnosis not present

## 2013-06-27 DIAGNOSIS — F0390 Unspecified dementia without behavioral disturbance: Secondary | ICD-10-CM | POA: Diagnosis not present

## 2013-06-27 DIAGNOSIS — G35 Multiple sclerosis: Secondary | ICD-10-CM | POA: Diagnosis not present

## 2013-06-27 DIAGNOSIS — L8992 Pressure ulcer of unspecified site, stage 2: Secondary | ICD-10-CM | POA: Diagnosis not present

## 2013-06-27 DIAGNOSIS — R339 Retention of urine, unspecified: Secondary | ICD-10-CM | POA: Diagnosis not present

## 2013-06-27 DIAGNOSIS — L89109 Pressure ulcer of unspecified part of back, unspecified stage: Secondary | ICD-10-CM | POA: Diagnosis not present

## 2013-06-27 DIAGNOSIS — E119 Type 2 diabetes mellitus without complications: Secondary | ICD-10-CM | POA: Diagnosis not present

## 2013-06-27 DIAGNOSIS — I2510 Atherosclerotic heart disease of native coronary artery without angina pectoris: Secondary | ICD-10-CM | POA: Diagnosis not present

## 2013-06-27 DIAGNOSIS — S78119A Complete traumatic amputation at level between unspecified hip and knee, initial encounter: Secondary | ICD-10-CM | POA: Diagnosis not present

## 2013-06-27 DIAGNOSIS — G547 Phantom limb syndrome without pain: Secondary | ICD-10-CM | POA: Diagnosis not present

## 2013-07-07 DIAGNOSIS — L89109 Pressure ulcer of unspecified part of back, unspecified stage: Secondary | ICD-10-CM | POA: Diagnosis not present

## 2013-07-07 DIAGNOSIS — E119 Type 2 diabetes mellitus without complications: Secondary | ICD-10-CM | POA: Diagnosis not present

## 2013-07-07 DIAGNOSIS — G35 Multiple sclerosis: Secondary | ICD-10-CM | POA: Diagnosis not present

## 2013-07-07 DIAGNOSIS — L8992 Pressure ulcer of unspecified site, stage 2: Secondary | ICD-10-CM | POA: Diagnosis not present

## 2013-07-08 ENCOUNTER — Telehealth: Payer: Self-pay | Admitting: *Deleted

## 2013-07-08 NOTE — Telephone Encounter (Signed)
Home health certification and plan of care forms completed by provider and faxed to [facility]

## 2013-07-12 ENCOUNTER — Other Ambulatory Visit: Payer: Self-pay | Admitting: Neurology

## 2013-07-12 NOTE — Telephone Encounter (Signed)
Patient has not been seen since 02/2012.  We noted on last Rx an appt was needed, but one has not been made.  I called the patient.  He will call back to schedule appt

## 2013-07-13 ENCOUNTER — Telehealth: Payer: Self-pay | Admitting: Nurse Practitioner

## 2013-07-13 MED ORDER — AMANTADINE HCL 100 MG PO CAPS: 100.0000 mg | ORAL_CAPSULE | Freq: Three times a day (TID) | ORAL | Status: DC

## 2013-07-13 NOTE — Telephone Encounter (Signed)
Rx has been sent.  Pharmacy will contact patient when it is ready for pick up

## 2013-07-19 ENCOUNTER — Other Ambulatory Visit: Payer: Self-pay | Admitting: Physical Medicine & Rehabilitation

## 2013-07-19 NOTE — Telephone Encounter (Signed)
07/19/2013  Copy sent to batch, Copy sent to billing.  bw

## 2013-07-19 NOTE — Addendum Note (Signed)
Addended by: Erick Colace on: 07/19/2013 03:52 PM   Modules accepted: Medications

## 2013-07-20 MED ORDER — ACETAMINOPHEN-CODEINE #3 300-30 MG PO TABS: ORAL_TABLET | ORAL | Status: DC

## 2013-07-20 NOTE — Addendum Note (Signed)
Addended by: Judd Gaudier on: 07/20/2013 08:59 AM   Modules accepted: Orders

## 2013-08-03 ENCOUNTER — Encounter: Payer: Medicare Other | Attending: Physical Medicine & Rehabilitation

## 2013-08-03 ENCOUNTER — Ambulatory Visit (HOSPITAL_BASED_OUTPATIENT_CLINIC_OR_DEPARTMENT_OTHER): Payer: Medicare Other | Admitting: Physical Medicine & Rehabilitation

## 2013-08-03 ENCOUNTER — Encounter: Payer: Self-pay | Admitting: Physical Medicine & Rehabilitation

## 2013-08-03 VITALS — BP 137/88 | HR 69 | Resp 14 | Ht 71.0 in | Wt 148.0 lb

## 2013-08-03 DIAGNOSIS — E119 Type 2 diabetes mellitus without complications: Secondary | ICD-10-CM | POA: Insufficient documentation

## 2013-08-03 DIAGNOSIS — G35 Multiple sclerosis: Secondary | ICD-10-CM | POA: Diagnosis not present

## 2013-08-03 DIAGNOSIS — M75100 Unspecified rotator cuff tear or rupture of unspecified shoulder, not specified as traumatic: Secondary | ICD-10-CM | POA: Diagnosis not present

## 2013-08-03 DIAGNOSIS — M4120 Other idiopathic scoliosis, site unspecified: Secondary | ICD-10-CM | POA: Diagnosis not present

## 2013-08-03 DIAGNOSIS — S78119A Complete traumatic amputation at level between unspecified hip and knee, initial encounter: Secondary | ICD-10-CM

## 2013-08-03 DIAGNOSIS — IMO0002 Reserved for concepts with insufficient information to code with codable children: Secondary | ICD-10-CM

## 2013-08-03 DIAGNOSIS — I2589 Other forms of chronic ischemic heart disease: Secondary | ICD-10-CM | POA: Insufficient documentation

## 2013-08-03 DIAGNOSIS — G547 Phantom limb syndrome without pain: Secondary | ICD-10-CM | POA: Insufficient documentation

## 2013-08-03 DIAGNOSIS — I10 Essential (primary) hypertension: Secondary | ICD-10-CM | POA: Insufficient documentation

## 2013-08-03 DIAGNOSIS — Z794 Long term (current) use of insulin: Secondary | ICD-10-CM | POA: Diagnosis not present

## 2013-08-03 DIAGNOSIS — M25519 Pain in unspecified shoulder: Secondary | ICD-10-CM | POA: Insufficient documentation

## 2013-08-03 DIAGNOSIS — G8220 Paraplegia, unspecified: Secondary | ICD-10-CM

## 2013-08-03 DIAGNOSIS — Z79899 Other long term (current) drug therapy: Secondary | ICD-10-CM | POA: Insufficient documentation

## 2013-08-03 DIAGNOSIS — E785 Hyperlipidemia, unspecified: Secondary | ICD-10-CM | POA: Diagnosis not present

## 2013-08-03 DIAGNOSIS — M7541 Impingement syndrome of right shoulder: Secondary | ICD-10-CM

## 2013-08-03 DIAGNOSIS — Z89611 Acquired absence of right leg above knee: Secondary | ICD-10-CM

## 2013-08-03 DIAGNOSIS — Z89612 Acquired absence of left leg above knee: Secondary | ICD-10-CM

## 2013-08-03 MED ORDER — ACETAMINOPHEN-CODEINE #4 300-60 MG PO TABS: 1.0000 | ORAL_TABLET | Freq: Three times a day (TID) | ORAL | Status: DC | PRN

## 2013-08-03 NOTE — Progress Notes (Signed)
Subjective:    Patient ID: Jaime [name], male    DOB: 03/12/1941, 73 y.o.   MRN: [id number]  HPI Had episode of severe thigh pain bilateral which was limited to the stump area of bilateral AKA. There was minimal phantom limb pain on the left side. He has had no skin breakdown or drainage from his incisions. No recent vascular surgery visits. He contacted his primary physician who recommended vascular surgery followup.  Chief complaint for me today includes right shoulder pain. He has had good relief with right shoulder injection last performed in August of 2014.  Had left shoulder injection performed in October of 2014. Pain Inventory Average Pain 10 Pain Right Now 10 My pain is intermittent and sharp  In the last 24 hours, has pain interfered with the following? General activity 10 Relation with others 10 Enjoyment of life 10 What TIME of day is your pain at its worst? all day Sleep (in general) Fair  Pain is worse with: na Pain improves with: na Relief from Meds: 1  Mobility how many minutes can you walk? does not walk ability to climb steps?  no do you drive?  no use a wheelchair needs help with transfers  Function disabled: date disabled na I need assistance with the following:  dressing, bathing, toileting, meal prep, household duties and shopping  Neuro/Psych bladder control problems bowel control problems weakness  Prior Studies Any changes since last visit?  no  Physicians involved in your care Any changes since last visit?  no   Family History  Problem Relation Age of Onset  . Hypertension    . Heart attack Mother 55  . Heart disease Mother   . Stroke Mother   . Hyperlipidemia Mother   . Hypertension Mother   . Colon cancer Neg Hx   . Prostate cancer Neg Hx   . COPD Father   . Peripheral vascular disease Father   . Diabetes Brother   . Heart disease Brother   . Hypertension Brother   . Heart attack Brother   . Diabetes Daughter     History   Social History  . Marital Status: Married    Spouse Name: N/A    Number of Children: 2  . Years of Education: N/A   Occupational History  . disable    Social History Main Topics  . Smoking status: Former Smoker -- 38 years  . Smokeless tobacco: Former Systems developer    Quit date: 08/05/2009     Comment: pt states that he is using E-cigs  . Alcohol Use: No  . Drug Use: No  . Sexual Activity: None     Comment: electronic cigarettes   Other Topics Concern  . None   Social History Narrative   Lives w/ wife   Past Surgical History  Procedure Laterality Date  . Hernia repair  1990    (R)  . Thoracentesis  2008    PLEURAL EFFUSION  . Cystoscopy  07/30/2011    Procedure: CYSTOSCOPY;  Surgeon: Hanley Ben, MD;  Location: [facility];  Service: Urology;  Laterality: N/A;  . Transurethral resection of prostate  07/30/2011    Procedure: TRANSURETHRAL RESECTION OF THE PROSTATE (TURP);  Surgeon: Hanley Ben, MD;  Location: [facility];  Service: Urology;  Laterality: N/A;  . Femoral-tibial bypass graft  03/11/2012    Procedure: BYPASS GRAFT FEMORAL-TIBIAL ARTERY;  Surgeon: Conrad Quinter, MD;  Location: [place];  Service: Vascular;  Laterality: Left;  Left Femoral -Tibial trunk bypass, Endarterectomy of Tibial- Peroneal trunk with vein angioplasty.  . Intraoperative arteriogram  03/11/2012    Procedure: INTRA OPERATIVE ARTERIOGRAM;  Surgeon: Conrad Penelope, MD;  Location: [place];  Service: Vascular;  Laterality: Left;  . Femoral-popliteal bypass graft  03/11/2012    Procedure: BYPASS GRAFT FEMORAL-POPLITEAL ARTERY;  Surgeon: Conrad Mahnomen, MD;  Location: [place];  Service: Vascular;  Laterality: Left;  embolectomy left lower leg  . Amputation  03/17/2012    Procedure: AMPUTATION ABOVE KNEE;  Surgeon: Conrad East Griffin, MD;  Location: [place];  Service: Vascular;  Laterality: Left;  . Coronary angioplasty with stent placement  05-01-2006    OCCLUDED CIRCUMFLEX -- TAXUS  STENT PLACMENT  AND RESIDUAL 80% LAD,  50% RCA  . Amputation  06/03/2012    Procedure: AMPUTATION ABOVE KNEE;  Surgeon: Conrad Vandalia, MD;  Location: [place];  Service: Vascular;  Laterality: Right;   Past Medical History  Diagnosis Date  . Hyperlipidemia   . Cardiomyopathy, ischemic     EF 45% per ECHO 2008  //   EF 25%, echo, August, 2013  . Pulmonary hypertension     moderate ECHO Jan 2008  . Systolic heart failure   . Multiple sclerosis [place] -- LAST VISIT 11-20-2010  NOTE W/ CHART  . Lung nodule     resolved 11-2006 CT Chest  . Tobacco abuse   . Increased prostate specific antigen (PSA) velocity   . Urinary retention     dx ~ 2-12, like from [place], now with a catheter, saw urology  . Carotid artery disease     Doppler, February, 2012, 0-39% bilateral,Mild smooth plaque  . Chronic indwelling Foley catheter   . Scoliosis associated with other condition   . Hemiparesis   . Fatigue SEVERE  . Impotence   . MI, acute, non ST segment elevation 2007    S/P PCI WITH X1 STENT (TAXUS DRUG-ELUTING) LEFT CIRCUMFLEX  . Diabetes mellitus     INSULIN-DEPENDANT  . Hypertension   . H/O pleural effusion 2008    POST THORACENTESIS  . History of colon polyps PRECANCEROUS  . Insomnia   . CAD (coronary artery disease) CARDIOLOGIST- DR KATZ-- VISIT 06-05-2011 IN EPIC    non-STEMI, 2007.Marland Kitchenoccluded circumflex.. Taxus stent placed...residual 80% LAD...50% RCA  . Urinary tract infection     hx of  . Atherosclerotic PVD with ulceration     left foot  . PAC (premature atrial contraction)     December, 2013   BP 137/88  Pulse 69  Resp 14  Ht 5\' 11"  (1.803 m)  Wt 148 lb (67.132 kg)  BMI 20.65 kg/m2  SpO2 97%     Review of Systems  Genitourinary:       Bowel control problem  Bladder control problem  Neurological: Positive for weakness.  All other systems reviewed and are negative.       Objective:   Physical Exam Positive impingement sign right shoulder.  Motor strength is 4 minus bilateral deltoid limited by pain. Has 4/5 strength in bilateral bicep triceps and grip  Left stump incision well healed no tenderness to palpation no erythema or significant edema Right stump incision well healed no tenderness to palpation no erythema or significant edema. Some dry skin around the incision  Abdomen positive bowel sounds, no abdominal bruit, no masses palpated       Assessment & Plan:  1. Right shoulder subacromial impingement  syndrome will repeat right shoulder injection. He is wheelchair bound and has some overuse in both upper extremities  2. Multiple sclerosis with chronic paraplegia and some decreased coordination in the upper extremities  3. 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 Continue Tylenol with codeine but change from Tylenol 3 to Tylenol #4 every day as needed Return to clinic 3 months for potential left shoulder injection  4. 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  Informed consent was obtained after describing risks and benefits of the procedure with the patient, this includes bleeding, bruising, infection and medication side effects. The patient wishes to proceed and has given written consent. Patient was placed in a seated position. The Right shoulder was marked and prepped with betadine in the subacromial area. A 25-gauge 1-1/2 inch needle was inserted into the subacromial area. After negative draw back for blood, a solution containing 1 mL of 6 mg per ML betamethasone and 4 mL of 1% lidocaine was injected. A band aid was applied. The patient tolerated the procedure well. Post procedure instructions were given.

## 2013-08-03 NOTE — Patient Instructions (Signed)
Joint Injection Care After Refer to this sheet in the next few days. These instructions provide you with information on caring for yourself after you have had a joint injection. Your caregiver also may give you more specific instructions. Your treatment has been planned according to current medical practices, but problems sometimes occur. Call your caregiver if you have any problems or questions after your procedure. After any type of joint injection, it is not uncommon to experience:  Soreness, swelling, or bruising around the injection site.  Mild numbness, tingling, or weakness around the injection site caused by the numbing medicine used before or with the injection. It also is possible to experience the following effects associated with the specific agent after injection:  Iodine-based contrast agents:  Allergic reaction (itching, hives, widespread redness, and swelling beyond the injection site).  Corticosteroids (These effects are rare.):  Allergic reaction.  Increased blood sugar levels (If you have diabetes and you notice that your blood sugar levels have increased, notify your caregiver).  Increased blood pressure levels.  Mood swings.  Hyaluronic acid in the use of viscosupplementation.  Temporary heat or redness.  Temporary rash and itching.  Increased fluid accumulation in the injected joint. These effects all should resolve within a day after your procedure.  HOME CARE INSTRUCTIONS  Limit yourself to light activity the day of your procedure. Avoid lifting heavy objects, bending, stooping, or twisting.  Take prescription or over-the-counter pain medication as directed by your caregiver.  You may apply ice to your injection site to reduce pain and swelling the day of your procedure. Ice may be applied 03-04 times:  Put ice in a plastic bag.  Place a towel between your skin and the bag.  Leave the ice on for no longer than 15-20 minutes each time. SEEK  IMMEDIATE MEDICAL CARE IF:   Pain and swelling get worse rather than better or extend beyond the injection site.  Numbness does not go away.  Blood or fluid continues to leak from the injection site.  You have chest pain.  You have swelling of your face or tongue.  You have trouble breathing or you become dizzy.  You develop a fever, chills, or severe tenderness at the injection site that last longer than 1 day. MAKE SURE YOU:  Understand these instructions.  Watch your condition.  Get help right away if you are not doing well or if you get worse. Document Released: 03/21/2011 Document Revised: 09/30/2011 Document Reviewed: 03/21/2011 [facility] Patient Information 2014 [place].   Also please call Dr Lianne Moris office for an appt to evaluate the thigh pain

## 2013-08-05 ENCOUNTER — Telehealth: Payer: Self-pay

## 2013-08-05 DIAGNOSIS — I739 Peripheral vascular disease, unspecified: Secondary | ICD-10-CM

## 2013-08-05 DIAGNOSIS — M79609 Pain in unspecified limb: Secondary | ICD-10-CM

## 2013-08-05 NOTE — Telephone Encounter (Signed)
Phone call from pt's wife.  Reports pt. Has had increased pain in bilateral thigh for approx. 2 weeks.  Wife states the pain is "more than phantom pain."  Stated that the left thigh was red for approx. 2 days.  Denies any redness in left thigh today.  States that the pain in the bilateral thigh alternates, with it being in one leg, and moving to the other leg.  Denies any inflammation in the stump incisional areas.  Reports small, dry skin patches on right stump incision.  Was advised by Dr. Letta Pate to schedule appt. with Dr. Bridgett Larsson.  Will discuss with Dr. Bridgett Larsson and schedule appt.

## 2013-08-06 NOTE — Telephone Encounter (Signed)
Spoke with patients wife to schedule. She is aware of the break in appointments, dpm

## 2013-08-06 NOTE — Telephone Encounter (Signed)
Discussed pt's symptoms with Dr. Bridgett Larsson.  Recommends scheduling Aorto-Iliac Duplex and office visit next Friday, 1/23.  Will notify pt.

## 2013-08-12 ENCOUNTER — Encounter: Payer: Self-pay | Admitting: Vascular Surgery

## 2013-08-13 ENCOUNTER — Ambulatory Visit (INDEPENDENT_AMBULATORY_CARE_PROVIDER_SITE_OTHER): Payer: Medicare Other | Admitting: Vascular Surgery

## 2013-08-13 ENCOUNTER — Ambulatory Visit (HOSPITAL_COMMUNITY)
Admission: RE | Admit: 2013-08-13 | Discharge: 2013-08-13 | Disposition: A | Discharge status: Home / Self Care | Payer: Medicare Other | Source: Ambulatory Visit | Attending: Vascular Surgery | Admitting: Vascular Surgery

## 2013-08-13 ENCOUNTER — Encounter: Payer: Self-pay | Admitting: Vascular Surgery

## 2013-08-13 ENCOUNTER — Encounter: Payer: Self-pay | Admitting: *Deleted

## 2013-08-13 ENCOUNTER — Other Ambulatory Visit: Payer: Self-pay | Admitting: *Deleted

## 2013-08-13 VITALS — BP 146/117 | HR 92 | Ht 71.0 in | Wt 148.0 lb

## 2013-08-13 DIAGNOSIS — M79609 Pain in unspecified limb: Secondary | ICD-10-CM | POA: Diagnosis not present

## 2013-08-13 DIAGNOSIS — S78119A Complete traumatic amputation at level between unspecified hip and knee, initial encounter: Secondary | ICD-10-CM | POA: Diagnosis not present

## 2013-08-13 DIAGNOSIS — Z89612 Acquired absence of left leg above knee: Secondary | ICD-10-CM

## 2013-08-13 DIAGNOSIS — I739 Peripheral vascular disease, unspecified: Secondary | ICD-10-CM | POA: Insufficient documentation

## 2013-08-13 DIAGNOSIS — Z89611 Acquired absence of right leg above knee: Secondary | ICD-10-CM

## 2013-08-13 NOTE — Progress Notes (Signed)
Established Critical Limb Ischemia Patient  History of Present Illness  Jaime [name] is a 73 y.o. (1941/04/19) male s/p B AKA who presents with chief complaint: Bilateral thigh pain.  The patient had an episode of severe L thigh pain and rubor throughout the entire L thigh per the pt's family.  Both the pain and rubor has resolved at this point.  The patient denies any numbness in either AKA stump and normal motor function in both.  He denies any recent cardiac arrhythmias.  He denies any ulcers or gangrene.  Past Medical History  Diagnosis Date  . Hyperlipidemia   . Cardiomyopathy, ischemic     EF 45% per ECHO 2008  //   EF 25%, echo, August, 2013  . Pulmonary hypertension     moderate ECHO Jan 2008  . Systolic heart failure   . Multiple sclerosis [place] -- LAST VISIT 11-20-2010  NOTE W/ CHART  . Lung nodule     resolved 11-2006 CT Chest  . Tobacco abuse   . Increased prostate specific antigen (PSA) velocity   . Urinary retention     dx ~ 2-12, like from [place], now with a catheter, saw urology  . Carotid artery disease     Doppler, February, 2012, 0-39% bilateral,Mild smooth plaque  . Chronic indwelling Foley catheter   . Scoliosis associated with other condition   . Hemiparesis   . Fatigue SEVERE  . Impotence   . MI, acute, non ST segment elevation 2007    S/P PCI WITH X1 STENT (TAXUS DRUG-ELUTING) LEFT CIRCUMFLEX  . Diabetes mellitus     INSULIN-DEPENDANT  . Hypertension   . H/O pleural effusion 2008    POST THORACENTESIS  . History of colon polyps PRECANCEROUS  . Insomnia   . CAD (coronary artery disease) CARDIOLOGIST- DR KATZ-- VISIT 06-05-2011 IN [facility]    non-STEMI, 2007.Marland Kitchenoccluded circumflex.. Taxus stent placed...residual 80% LAD...50% RCA  . Urinary tract infection     hx of  . Atherosclerotic PVD with ulceration     left foot  . PAC (premature atrial contraction)     December, 2013    Past Surgical History  Procedure  Laterality Date  . Hernia repair  1990    (R)  . Thoracentesis  2008    PLEURAL EFFUSION  . Cystoscopy  07/30/2011    Procedure: CYSTOSCOPY;  Surgeon: Hanley Ben, MD;  Location: [facility];  Service: Urology;  Laterality: N/A;  . Transurethral resection of prostate  07/30/2011    Procedure: TRANSURETHRAL RESECTION OF THE PROSTATE (TURP);  Surgeon: Hanley Ben, MD;  Location: [facility];  Service: Urology;  Laterality: N/A;  . Femoral-tibial bypass graft  03/11/2012    Procedure: BYPASS GRAFT FEMORAL-TIBIAL ARTERY;  Surgeon: Conrad Turpin, MD;  Location: [place];  Service: Vascular;  Laterality: Left;  Left Femoral -Tibial trunk bypass, Endarterectomy of Tibial- Peroneal trunk with vein angioplasty.  . Intraoperative arteriogram  03/11/2012    Procedure: INTRA OPERATIVE ARTERIOGRAM;  Surgeon: Conrad Cresson, MD;  Location: [place];  Service: Vascular;  Laterality: Left;  . Femoral-popliteal bypass graft  03/11/2012    Procedure: BYPASS GRAFT FEMORAL-POPLITEAL ARTERY;  Surgeon: Conrad [place], MD;  Location: [place];  Service: Vascular;  Laterality: Left;  embolectomy left lower leg  . Amputation  03/17/2012    Procedure: AMPUTATION ABOVE KNEE;  Surgeon: Conrad Waverly, MD;  Location: [place];  Service:  Vascular;  Laterality: Left;  . Coronary angioplasty with stent placement  05-01-2006    OCCLUDED CIRCUMFLEX -- TAXUS STENT PLACMENT  AND RESIDUAL 80% LAD,  50% RCA  . Amputation  06/03/2012    Procedure: AMPUTATION ABOVE KNEE;  Surgeon: Conrad Pitt, MD;  Location: [place];  Service: Vascular;  Laterality: Right;    History   Social History  . Marital Status: Married    Spouse Name: N/A    Number of Children: 2  . Years of Education: N/A   Occupational History  . disable    Social History Main Topics  . Smoking status: Former Smoker -- 23 years  . Smokeless tobacco: Former Systems developer    Quit date: 08/05/2009     Comment: pt states that he is using E-cigs  . Alcohol Use:  No  . Drug Use: No  . Sexual Activity: Not on file     Comment: electronic cigarettes   Other Topics Concern  . Not on file   Social History Narrative   Lives w/ wife    Family History  Problem Relation Age of Onset  . Hypertension    . Heart attack Mother 71  . Heart disease Mother   . Stroke Mother   . Hyperlipidemia Mother   . Hypertension Mother   . Colon cancer Neg Hx   . Prostate cancer Neg Hx   . COPD Father   . Peripheral vascular disease Father   . Diabetes Brother   . Heart disease Brother   . Hypertension Brother   . Heart attack Brother   . Diabetes Daughter    Current Outpatient Prescriptions on File Prior to Visit  Medication Sig Dispense Refill  . acetaminophen-codeine (TYLENOL #4) 300-60 MG per tablet Take 1 tablet by mouth every 8 (eight) hours as needed for moderate pain.  30 tablet  0  . amantadine (SYMMETREL) 100 MG capsule Take 1 capsule (100 mg total) by mouth 3 (three) times daily.  90 capsule  3  . aspirin EC 325 MG tablet Take 325 mg by mouth daily.      Marland Kitchen atorvastatin (LIPITOR) 40 MG tablet Take 1 tablet (40 mg total) by mouth at bedtime.  90 tablet  1  . B-D ULTRAFINE III SHORT PEN 31G X 8 MM MISC       . baclofen (LIORESAL) 10 MG tablet Take 0.5 tablets (5 mg total) by mouth 2 (two) times daily. Take 1/2 tablet  30 each  2  . benazepril (LOTENSIN) 10 MG tablet TAKE 1 TABLET BY MOUTH EVERY DAY  90 tablet  1  . diclofenac sodium (VOLTAREN) 1 % GEL Apply 1 application topically 4 (four) times daily as needed. For pain       . gabapentin (NEURONTIN) 800 MG tablet Take 800 mg by mouth 4 (four) times daily.      Marland Kitchen GLUCAGEN HYPOKIT 1 MG SOLR injection       . metoprolol succinate (TOPROL-XL) 25 MG 24 hr tablet Take 1.5 tablets (37.5 mg total) by mouth daily. Take 1.5 tablets (37.5 mg total) daily  135 tablet  1  . pantoprazole (PROTONIX) 40 MG tablet TAKE 1 TABLET BY MOUTH EVERY DAY AT 12 NOON  30 tablet  5  . insulin aspart protamine-insulin aspart  (NOVOLOG 70/30) (70-30) 100 UNIT/ML injection Inject 10-30 Units into the skin 2 (two) times daily with a meal. Take 30 units at breakfast and 10 units at supper  No current facility-administered medications on file prior to visit.    Allergies  Allergen Reactions  . Bee Venom Anaphylaxis    REVIEW OF SYSTEMS:  (Positives checked otherwise negative)  CARDIOVASCULAR:  []  chest pain, []  chest pressure, []  palpitations, []  shortness of breath when laying flat, []  shortness of breath with exertion,  []  pain in feet when walking, [x]  pain in feet when laying flat, []  history of blood clot in veins (DVT), []  history of phlebitis, []  swelling in legs, []  varicose veins  PULMONARY:  []  productive cough, []  asthma, []  wheezing  NEUROLOGIC:  [x]  multiple sclerosis, [x]  weakness in arms or legs, [x]  numbness in arms or legs, []  difficulty speaking or slurred speech, []  temporary loss of vision in one eye, []  dizziness  HEMATOLOGIC:  []  bleeding problems, []  problems with blood clotting too easily  MUSCULOSKEL:  []  joint pain, []  joint swelling, [x]  B AKA  GASTROINTEST:  []  vomiting blood, []  blood in stool     GENITOURINARY:  []  burning with urination, []  blood in urine  PSYCHIATRIC:  []  history of major depression  INTEGUMENTARY:  []  rashes, []  ulcers  Physical Examination  Filed Vitals:   08/13/13 1351  BP: 146/117  Pulse: 92  Height: 5\' 11"  (1.803 m)  Weight: 148 lb (67.132 kg)  SpO2: 99%   Body mass index is 20.65 kg/(m^2).  General: A&O x 3, WDWN  Eyes: PERRLA, EOMI  Pulmonary: Sym exp, good air movt, CTAB, no rales, rhonchi, & wheezing  Cardiac: RRR, Nl S1, S2, no Murmurs, rubs or gallops  Vascular: Vessel Right Left  Radial Palpable Palpable  Brachial Palpable Palpable  Carotid Palpable, without bruit Palpable, without bruit  Aorta Not palpable N/A  Femoral FaintlyPalpable Faintly Palpable  Popliteal AKA AKA  PT AKA AKA  DP AKA AKA   Gastrointestinal:  soft, NTND, -G/R, - HSM, - masses, - CVAT B  Musculoskeletal: M/S 5/5 throughout , B AKA, no erythema or TTP light touch  Neurologic: Pain and light touch intact in extremities , Motor exam as listed above  Non-Invasive Vascular Imaging Aortoiliac duplex (Date: 08/13/2013)  R: CIA not visualized, EIA patent with monophasic flow  L: CIA not visualized, EIA patent with monophasic flow  Medical Decision Making  CHARMAINE SIVERS is a 73 y.o. male who presents with: s/p B AKA , possible thromboembolic episode , known aortoiliac disease   Based on the patient's vascular studies and examination, I have offered the patient: aortogram, bilateral pelvic angiogram, possible intervention.  In reviewing the previous angiograms, I don't see anything other than possible distal aortic stenosis <30% to account for only monophasic flow at the level of the external iliac artery.  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  His description of a prior episode of L thigh pain with color change is concerning for a possible thromboembolic event that spontaneous lysed.  Further diagnostic image thus is indicated. I discussed with the patient the nature of angiographic procedures, especially the limited patencies of any endovascular intervention.  The patient is aware of that the risks of an angiographic procedure include but are not limited to: bleeding, infection, access site complications, renal failure, embolization, rupture of vessel, dissection, possible need for emergent surgical intervention, possible need for surgical procedures to treat the patient's pathology, anaphylactic reaction to contrast, and stroke and death.   The patient is aware of the risks and agrees to proceed.  Thank you for allowing [place] to participate in this  patient's care.  Adele Barthel, MD [facility] [place] Office: [phone] Pager: [phone]  08/13/2013, 2:28 PM

## 2013-08-16 ENCOUNTER — Ambulatory Visit (INDEPENDENT_AMBULATORY_CARE_PROVIDER_SITE_OTHER): Payer: Medicare Other | Admitting: Internal Medicine

## 2013-08-16 ENCOUNTER — Encounter: Payer: Self-pay | Admitting: Internal Medicine

## 2013-08-16 ENCOUNTER — Encounter (HOSPITAL_COMMUNITY): Payer: Self-pay | Admitting: Pharmacy Technician

## 2013-08-16 VITALS — BP 151/91 | HR 60 | Temp 98.2°F

## 2013-08-16 DIAGNOSIS — I10 Essential (primary) hypertension: Secondary | ICD-10-CM

## 2013-08-16 DIAGNOSIS — I509 Heart failure, unspecified: Secondary | ICD-10-CM

## 2013-08-16 DIAGNOSIS — I2510 Atherosclerotic heart disease of native coronary artery without angina pectoris: Secondary | ICD-10-CM

## 2013-08-16 DIAGNOSIS — I739 Peripheral vascular disease, unspecified: Secondary | ICD-10-CM

## 2013-08-16 NOTE — Progress Notes (Signed)
Subjective:    Patient ID: Jaime [name], male    DOB: July 07, 1941, 73 y.o.   MRN: [id number]  HPI Here with his wife, his main concern is getting the paperwork filled for the [place], he likes to get  assistance with ADLs. The patient is wheelchair-bound, status post bilateral above-the-knee amputations.  Past Medical History  Diagnosis Date  . Hyperlipidemia   . Cardiomyopathy, ischemic     EF 45% per ECHO 2008  //   EF 25%, echo, August, 2013  . Pulmonary hypertension     moderate ECHO Jan 2008  . Systolic heart failure   . Multiple sclerosis [place] -- LAST VISIT 11-20-2010  NOTE W/ CHART  . Lung nodule     resolved 11-2006 CT Chest  . Tobacco abuse   . Increased prostate specific antigen (PSA) velocity   . Urinary retention     dx ~ 2-12, like from [place], now with a catheter, saw urology  . Carotid artery disease     Doppler, February, 2012, 0-39% bilateral,Mild smooth plaque  . Chronic indwelling Foley catheter   . Scoliosis associated with other condition   . Hemiparesis   . Fatigue SEVERE  . Impotence   . MI, acute, non ST segment elevation 2007    S/P PCI WITH X1 STENT (TAXUS DRUG-ELUTING) LEFT CIRCUMFLEX  . Diabetes mellitus     INSULIN-DEPENDANT  . Hypertension   . H/O pleural effusion 2008    POST THORACENTESIS  . History of colon polyps PRECANCEROUS  . Insomnia   . CAD (coronary artery disease) CARDIOLOGIST- DR KATZ-- VISIT 06-05-2011 IN [facility]    non-STEMI, 2007.Marland Kitchenoccluded circumflex.. Taxus stent placed...residual 80% LAD...50% RCA  . Urinary tract infection     hx of  . Atherosclerotic PVD with ulceration     left foot  . PAC (premature atrial contraction)     December, 2013   Past Surgical History  Procedure Laterality Date  . Hernia repair  1990    (R)  . Thoracentesis  2008    PLEURAL EFFUSION  . Cystoscopy  07/30/2011    Procedure: CYSTOSCOPY;  Surgeon: Hanley Ben, MD;  Location: [facility];  Service:  Urology;  Laterality: N/A;  . Transurethral resection of prostate  07/30/2011    Procedure: TRANSURETHRAL RESECTION OF THE PROSTATE (TURP);  Surgeon: Hanley Ben, MD;  Location: [facility];  Service: Urology;  Laterality: N/A;  . Femoral-tibial bypass graft  03/11/2012    Procedure: BYPASS GRAFT FEMORAL-TIBIAL ARTERY;  Surgeon: Conrad Richwood, MD;  Location: [place];  Service: Vascular;  Laterality: Left;  Left Femoral -Tibial trunk bypass, Endarterectomy of Tibial- Peroneal trunk with vein angioplasty.  . Intraoperative arteriogram  03/11/2012    Procedure: INTRA OPERATIVE ARTERIOGRAM;  Surgeon: Conrad [place], MD;  Location: [place];  Service: Vascular;  Laterality: Left;  . Femoral-popliteal bypass graft  03/11/2012    Procedure: BYPASS GRAFT FEMORAL-POPLITEAL ARTERY;  Surgeon: Conrad [place], MD;  Location: [place];  Service: Vascular;  Laterality: Left;  embolectomy left lower leg  . Amputation  03/17/2012    Procedure: AMPUTATION ABOVE KNEE;  Surgeon: Conrad [place], MD;  Location: [place];  Service: Vascular;  Laterality: Left;  . Coronary angioplasty with stent placement  05-01-2006    OCCLUDED CIRCUMFLEX -- TAXUS STENT PLACMENT  AND RESIDUAL 80% LAD,  50% RCA  . Amputation  06/03/2012    Procedure: AMPUTATION ABOVE  KNEE;  Surgeon: Conrad [place], MD;  Location: [place];  Service: Vascular;  Laterality: Right;   History   Social History  . Marital Status: Married    Spouse Name: N/A    Number of Children: 2  . Years of Education: N/A   Occupational History  . disable    Social History Main Topics  . Smoking status: Former Smoker -- 61 years  . Smokeless tobacco: Former Systems developer    Quit date: 08/05/2009     Comment: pt states that he is using E-cigs  . Alcohol Use: No  . Drug Use: No  . Sexual Activity: Not on file     Comment: electronic cigarettes   Other Topics Concern  . Not on file   Social History Narrative   Lives w/ wife     Review of Systems Denies chest pain or  shortness of breath Has seen endocrinology recently, diabetes is not well-controlled. Still having phantom pains in the lower extremities, recently was told he has more vascular disease he will get an angiogram soon.     Objective:   Physical Exam  BP 151/91  Pulse 60  Temp(Src) 98.2 F (36.8 C)  SpO2 98% General -- alert, well-developed, NAD. Wheelchair-bound Lungs -- normal respiratory effort, no intercostal retractions, no accessory muscle use, and normal breath sounds.  Heart-- normal rate, regular rhythm, no murmur.   Extremities-- s/p B AKA Neurologic--  alert & oriented X3. Speech normal.  Psych-- Cognition and judgment appear intact. Cooperative with normal attention span and concentration. No anxious or depressed appearing.     Assessment & Plan:

## 2013-08-16 NOTE — Assessment & Plan Note (Signed)
Does not appear to be  vol overloaded at this time

## 2013-08-16 NOTE — Assessment & Plan Note (Addendum)
Well controlled, last BMP normal. No change

## 2013-08-16 NOTE — Assessment & Plan Note (Signed)
asx

## 2013-08-16 NOTE — Assessment & Plan Note (Addendum)
Continue with phantom pains from B AKA and apparently iliac vascular disease. Reports will have a angiogram soon. I filled  extensive paper work for the patient , trying to obtain help from the [place] which he  certainly needs.

## 2013-08-16 NOTE — Progress Notes (Signed)
Pre visit review using our clinic review tool, if applicable. No additional management support is needed unless otherwise documented below in the visit note. 

## 2013-08-16 NOTE — Patient Instructions (Addendum)
  Next visit is for routine check up regards diabetes, hypertension, cholesterol in 4 months,  fasting Please make an appointment

## 2013-08-19 ENCOUNTER — Encounter (HOSPITAL_COMMUNITY)
Admission: RE | Disposition: A | Discharge status: Home / Self Care | Payer: Self-pay | Source: Ambulatory Visit | Attending: Vascular Surgery

## 2013-08-19 ENCOUNTER — Ambulatory Visit (HOSPITAL_COMMUNITY)
Admission: RE | Admit: 2013-08-19 | Discharge: 2013-08-19 | Disposition: A | Discharge status: Home / Self Care | Payer: Medicare Other | Source: Ambulatory Visit | Attending: Vascular Surgery | Admitting: Vascular Surgery

## 2013-08-19 DIAGNOSIS — S78119A Complete traumatic amputation at level between unspecified hip and knee, initial encounter: Secondary | ICD-10-CM | POA: Insufficient documentation

## 2013-08-19 DIAGNOSIS — F17200 Nicotine dependence, unspecified, uncomplicated: Secondary | ICD-10-CM | POA: Insufficient documentation

## 2013-08-19 DIAGNOSIS — I739 Peripheral vascular disease, unspecified: Secondary | ICD-10-CM | POA: Insufficient documentation

## 2013-08-19 DIAGNOSIS — I700 Atherosclerosis of aorta: Secondary | ICD-10-CM | POA: Diagnosis not present

## 2013-08-19 DIAGNOSIS — I5020 Unspecified systolic (congestive) heart failure: Secondary | ICD-10-CM | POA: Insufficient documentation

## 2013-08-19 DIAGNOSIS — I2789 Other specified pulmonary heart diseases: Secondary | ICD-10-CM | POA: Diagnosis not present

## 2013-08-19 DIAGNOSIS — Z7982 Long term (current) use of aspirin: Secondary | ICD-10-CM | POA: Diagnosis not present

## 2013-08-19 DIAGNOSIS — I6529 Occlusion and stenosis of unspecified carotid artery: Secondary | ICD-10-CM | POA: Insufficient documentation

## 2013-08-19 DIAGNOSIS — I428 Other cardiomyopathies: Secondary | ICD-10-CM | POA: Diagnosis not present

## 2013-08-19 DIAGNOSIS — Z794 Long term (current) use of insulin: Secondary | ICD-10-CM | POA: Diagnosis not present

## 2013-08-19 DIAGNOSIS — G35 Multiple sclerosis: Secondary | ICD-10-CM | POA: Insufficient documentation

## 2013-08-19 DIAGNOSIS — I10 Essential (primary) hypertension: Secondary | ICD-10-CM | POA: Diagnosis not present

## 2013-08-19 DIAGNOSIS — I658 Occlusion and stenosis of other precerebral arteries: Secondary | ICD-10-CM | POA: Diagnosis not present

## 2013-08-19 DIAGNOSIS — G8190 Hemiplegia, unspecified affecting unspecified side: Secondary | ICD-10-CM | POA: Diagnosis not present

## 2013-08-19 DIAGNOSIS — E785 Hyperlipidemia, unspecified: Secondary | ICD-10-CM | POA: Diagnosis not present

## 2013-08-19 DIAGNOSIS — M79609 Pain in unspecified limb: Secondary | ICD-10-CM | POA: Insufficient documentation

## 2013-08-19 DIAGNOSIS — Z9861 Coronary angioplasty status: Secondary | ICD-10-CM | POA: Diagnosis not present

## 2013-08-19 DIAGNOSIS — I252 Old myocardial infarction: Secondary | ICD-10-CM | POA: Diagnosis not present

## 2013-08-19 DIAGNOSIS — I70209 Unspecified atherosclerosis of native arteries of extremities, unspecified extremity: Secondary | ICD-10-CM

## 2013-08-19 DIAGNOSIS — E119 Type 2 diabetes mellitus without complications: Secondary | ICD-10-CM | POA: Diagnosis not present

## 2013-08-19 DIAGNOSIS — I2510 Atherosclerotic heart disease of native coronary artery without angina pectoris: Secondary | ICD-10-CM | POA: Diagnosis not present

## 2013-08-19 HISTORY — PX: ABDOMINAL AORTAGRAM: SHX5454

## 2013-08-19 LAB — POCT I-STAT, CHEM 8
BUN: 20 mg/dL (ref 6–23)
CALCIUM ION: 1.25 mmol/L (ref 1.13–1.30)
CHLORIDE: 106 meq/L (ref 96–112)
CREATININE: 0.8 mg/dL (ref 0.50–1.35)
GLUCOSE: 81 mg/dL (ref 70–99)
HEMATOCRIT: 47 % (ref 39.0–52.0)
Hemoglobin: 16 g/dL (ref 13.0–17.0)
Potassium: 4.1 mEq/L (ref 3.7–5.3)
Sodium: 145 mEq/L (ref 137–147)
TCO2: 25 mmol/L (ref 0–100)

## 2013-08-19 SURGERY — ABDOMINAL AORTAGRAM
Anesthesia: LOCAL

## 2013-08-19 MED ORDER — LIDOCAINE HCL (PF) 1 % IJ SOLN
INTRAMUSCULAR | Status: AC
  Filled 2013-08-19: qty 30

## 2013-08-19 MED ORDER — MIDAZOLAM HCL 2 MG/2ML IJ SOLN
INTRAMUSCULAR | Status: AC
  Filled 2013-08-19: qty 2

## 2013-08-19 MED ORDER — ACETAMINOPHEN 325 MG PO TABS: 650.0000 mg | ORAL_TABLET | ORAL | Status: DC | PRN

## 2013-08-19 MED ORDER — MORPHINE SULFATE 2 MG/ML IJ SOLN: 2.0000 mg | INTRAMUSCULAR | Status: DC | PRN

## 2013-08-19 MED ORDER — FENTANYL CITRATE 0.05 MG/ML IJ SOLN
INTRAMUSCULAR | Status: AC
  Filled 2013-08-19: qty 2

## 2013-08-19 MED ORDER — OXYCODONE-ACETAMINOPHEN 5-325 MG PO TABS: 1.0000 | ORAL_TABLET | ORAL | Status: DC | PRN

## 2013-08-19 MED ORDER — SODIUM CHLORIDE 0.9 % IV SOLN: 1.0000 mL/kg/h | INTRAVENOUS | Status: DC

## 2013-08-19 MED ORDER — HEPARIN (PORCINE) IN NACL 2-0.9 UNIT/ML-% IJ SOLN
INTRAMUSCULAR | Status: AC
Start: 2013-08-19 — End: 2013-08-19
  Filled 2013-08-19: qty 1000

## 2013-08-19 MED ORDER — SODIUM CHLORIDE 0.9 % IV SOLN
INTRAVENOUS | Status: DC
  Administered 2013-08-19: 09:00:00 via INTRAVENOUS

## 2013-08-19 MED ORDER — ONDANSETRON HCL 4 MG/2ML IJ SOLN: 4.0000 mg | Freq: Four times a day (QID) | INTRAMUSCULAR | Status: DC | PRN

## 2013-08-19 NOTE — Discharge Instructions (Signed)
Angiography, Care After °Refer to this sheet in the next few weeks. These instructions provide you with information on caring for yourself after your procedure. Your health care provider may also give you more specific instructions. Your treatment has been planned according to current medical practices, but problems sometimes occur. Call your health care provider if you have any problems or questions after your procedure.  °WHAT TO EXPECT AFTER THE PROCEDURE °After your procedure, it is typical to have the following sensations: °· Minor discomfort or tenderness and a small bump at the catheter insertion site. The bump should usually decrease in size and tenderness within 1 to 2 weeks. °· Any bruising will usually fade within 2 to 4 weeks. °HOME CARE INSTRUCTIONS  °· You may need to keep taking blood thinners if they were prescribed for you. Only take over-the-counter or prescription medicines for pain, fever, or discomfort as directed by your health care provider. °· Do not apply powder or lotion to the site. °· Do not sit in a bathtub, swimming pool, or whirlpool for 5 to 7 days. °· You may shower 24 hours after the procedure. Remove the bandage (dressing) and gently wash the site with plain soap and water. Gently pat the site dry. °· Inspect the site at least twice daily. °· Limit your activity for the first 24 hours. Do not bend, squat, or lift anything over 10 lb (9 kg) for 10 days or as directed by your health care provider. °· Do not drive home if you are discharged the day of the procedure. Have someone else drive you. Follow instructions about when you can drive or return to work. °SEEK MEDICAL CARE IF: °· You get lightheaded when standing up. °· You have drainage (other than a small amount of blood on the dressing). °· You have chills. °· You have a fever. °· You have redness, warmth, swelling, or pain at the insertion site. °SEEK IMMEDIATE MEDICAL CARE IF:  °· You develop chest pain or shortness of breath,  feel faint, or pass out. °· You have bleeding, swelling larger than a walnut, or drainage from the catheter insertion site. °· You develop pain, discoloration, coldness, or severe bruising in the leg or arm that held the catheter. °· You have heavy bleeding from the site. If this happens, hold pressure on the site. °MAKE SURE YOU: °· Understand these instructions. °· Will watch your condition. °· Will get help right away if you are not doing well or get worse. °Document Released: 01/24/2005 Document Revised: 03/10/2013 Document Reviewed: 11/30/2012 °ExitCare® Patient Information ©2014 [employer]. ° °

## 2013-08-19 NOTE — Op Note (Addendum)
   OPERATIVE NOTE   PROCEDURE: 1.  Right common femoral artery cannulation under ultrasound guidance 2.  Placement of catheter in aorta 3.  Aortogram 4.  Limited bilateral leg runoff (B pelvic angiogram)  PRE-OPERATIVE DIAGNOSIS: Possible iliac artery stenosis  POST-OPERATIVE DIAGNOSIS: same as above   SURGEON: Adele Barthel, MD  ANESTHESIA: conscious sedation  ESTIMATED BLOOD LOSS: 50 cc  CONTRAST: 80 cc  FINDING(S):  Aorta: Patent with extensive calcific plaque  Superior mesenteric artery: patent Celiac artery: not visualized   Right Left  RA Patent Patent  CIA Patent but calcified, 50% stenosis Patent but calcified  EIA Patent Patent  IIA Patent proximally, occluded distally Patent proximally, occluded distally  CFA Patent but diseased Patent but disease  SFA Occluded Occluded  PFA Patent but diseased Patent but diseased   SPECIMEN(S):  none  INDICATIONS:   Jaime [name] is a 73 y.o. male who presents with abnormal inflow to bilateral common femoral artery on arterial duplex.  The patient presents for: aortogram, bilateral pelvic angiogram, and possible intervention.  I discussed with the patient the nature of angiographic procedures, especially the limited patencies of any endovascular intervention.  The patient is aware of that the risks of an angiographic procedure include but are not limited to: bleeding, infection, access site complications, renal failure, embolization, rupture of vessel, dissection, possible need for emergent surgical intervention, possible need for surgical procedures to treat the patient's pathology, and stroke and death.  The patient is aware of the risks and agrees to proceed.  DESCRIPTION: After full informed consent was obtained from the patient, the patient was brought back to the angiography suite.  The patient was placed supine upon the angiography table and connected to monitoring equipment.  The patient was then given conscious sedation,  the amounts of which are documented in the patient's chart.  The patient was prepped and drape in the standard fashion for an angiographic procedure.  At this point, attention was turned to the right groin.  Under ultrasound guidance, the right common femoral artery will be cannulated with a 18 gauge needle.  The [facility] wire was passed up into the aorta.  The needle was exchanged for a 5-Fr sheath, which was advanced over the wire into the common femoral artery.  The dilator was then removed.  The Omniflush catheter was then loaded over the wire up to the level of L1.  The catheter was connected to the power injector circuit.  After de-airring and de-clotting the circuit, a power injector aortogram was completed.  The catheter was pulled down to the distal aorta.  LAO and RAO pelvic projections were obtained.  I replaced the wire into the catheter, straightening out the crook in the catheter.  Both were removed from the sheath together.  The sheath was aspirated.  No clots were present and the sheath was reloaded with heparinized saline.    I doubt that this patient's symptoms are due to his iliac and profunda arterial disease given the angiographic findings.  No intervention are not needed at this point.  COMPLICATIONS: none  CONDITION: stable  Adele Barthel, MD [facility] [place] Office: [phone] Pager: [phone]  08/19/2013, 1:07 PM

## 2013-08-19 NOTE — Interval H&P Note (Signed)
[facility] [place]  History and Physical Update  The patient was interviewed and re-examined.  The patient's previous History and Physical has been reviewed and is unchanged.  There is no change in the plan of care: aortogram, bilateral pelvic angiogram, and possible intervention.  Adele Barthel, MD [facility] [place] Office: [phone] Pager: [phone]  08/19/2013, 8:39 AM

## 2013-08-19 NOTE — H&P (View-Only) (Signed)
Established Critical Limb Ischemia Patient  History of Present Illness  Jaime [name] is a 73 y.o. (1941/04/19) male s/p B AKA who presents with chief complaint: Bilateral thigh pain.  The patient had an episode of severe L thigh pain and rubor throughout the entire L thigh per the pt's family.  Both the pain and rubor has resolved at this point.  The patient denies any numbness in either AKA stump and normal motor function in both.  He denies any recent cardiac arrhythmias.  He denies any ulcers or gangrene.  Past Medical History  Diagnosis Date  . Hyperlipidemia   . Cardiomyopathy, ischemic     EF 45% per ECHO 2008  //   EF 25%, echo, August, 2013  . Pulmonary hypertension     moderate ECHO Jan 2008  . Systolic heart failure   . Multiple sclerosis [place] -- LAST VISIT 11-20-2010  NOTE W/ CHART  . Lung nodule     resolved 11-2006 CT Chest  . Tobacco abuse   . Increased prostate specific antigen (PSA) velocity   . Urinary retention     dx ~ 2-12, like from [place], now with a catheter, saw urology  . Carotid artery disease     Doppler, February, 2012, 0-39% bilateral,Mild smooth plaque  . Chronic indwelling Foley catheter   . Scoliosis associated with other condition   . Hemiparesis   . Fatigue SEVERE  . Impotence   . MI, acute, non ST segment elevation 2007    S/P PCI WITH X1 STENT (TAXUS DRUG-ELUTING) LEFT CIRCUMFLEX  . Diabetes mellitus     INSULIN-DEPENDANT  . Hypertension   . H/O pleural effusion 2008    POST THORACENTESIS  . History of colon polyps PRECANCEROUS  . Insomnia   . CAD (coronary artery disease) CARDIOLOGIST- DR KATZ-- VISIT 06-05-2011 IN [facility]    non-STEMI, 2007.Marland Kitchenoccluded circumflex.. Taxus stent placed...residual 80% LAD...50% RCA  . Urinary tract infection     hx of  . Atherosclerotic PVD with ulceration     left foot  . PAC (premature atrial contraction)     December, 2013    Past Surgical History  Procedure  Laterality Date  . Hernia repair  1990    (R)  . Thoracentesis  2008    PLEURAL EFFUSION  . Cystoscopy  07/30/2011    Procedure: CYSTOSCOPY;  Surgeon: Hanley Ben, MD;  Location: [facility];  Service: Urology;  Laterality: N/A;  . Transurethral resection of prostate  07/30/2011    Procedure: TRANSURETHRAL RESECTION OF THE PROSTATE (TURP);  Surgeon: Hanley Ben, MD;  Location: [facility];  Service: Urology;  Laterality: N/A;  . Femoral-tibial bypass graft  03/11/2012    Procedure: BYPASS GRAFT FEMORAL-TIBIAL ARTERY;  Surgeon: Conrad Turpin, MD;  Location: [place];  Service: Vascular;  Laterality: Left;  Left Femoral -Tibial trunk bypass, Endarterectomy of Tibial- Peroneal trunk with vein angioplasty.  . Intraoperative arteriogram  03/11/2012    Procedure: INTRA OPERATIVE ARTERIOGRAM;  Surgeon: Conrad Cresson, MD;  Location: [place];  Service: Vascular;  Laterality: Left;  . Femoral-popliteal bypass graft  03/11/2012    Procedure: BYPASS GRAFT FEMORAL-POPLITEAL ARTERY;  Surgeon: Conrad Manit[place], MD;  Location: [place];  Service: Vascular;  Laterality: Left;  embolectomy left lower leg  . Amputation  03/17/2012    Procedure: AMPUTATION ABOVE KNEE;  Surgeon: Conrad Waverly, MD;  Location: [place];  Service:  Vascular;  Laterality: Left;  . Coronary angioplasty with stent placement  05-01-2006    OCCLUDED CIRCUMFLEX -- TAXUS STENT PLACMENT  AND RESIDUAL 80% LAD,  50% RCA  . Amputation  06/03/2012    Procedure: AMPUTATION ABOVE KNEE;  Surgeon: Conrad Wurtsboro, MD;  Location: [place];  Service: Vascular;  Laterality: Right;    History   Social History  . Marital Status: Married    Spouse Name: N/A    Number of Children: 2  . Years of Education: N/A   Occupational History  . disable    Social History Main Topics  . Smoking status: Former Smoker -- 23 years  . Smokeless tobacco: Former Systems developer    Quit date: 08/05/2009     Comment: pt states that he is using E-cigs  . Alcohol Use:  No  . Drug Use: No  . Sexual Activity: Not on file     Comment: electronic cigarettes   Other Topics Concern  . Not on file   Social History Narrative   Lives w/ wife    Family History  Problem Relation Age of Onset  . Hypertension    . Heart attack Mother 54  . Heart disease Mother   . Stroke Mother   . Hyperlipidemia Mother   . Hypertension Mother   . Colon cancer Neg Hx   . Prostate cancer Neg Hx   . COPD Father   . Peripheral vascular disease Father   . Diabetes Brother   . Heart disease Brother   . Hypertension Brother   . Heart attack Brother   . Diabetes Daughter    Current Outpatient Prescriptions on File Prior to Visit  Medication Sig Dispense Refill  . acetaminophen-codeine (TYLENOL #4) 300-60 MG per tablet Take 1 tablet by mouth every 8 (eight) hours as needed for moderate pain.  30 tablet  0  . amantadine (SYMMETREL) 100 MG capsule Take 1 capsule (100 mg total) by mouth 3 (three) times daily.  90 capsule  3  . aspirin EC 325 MG tablet Take 325 mg by mouth daily.      Marland Kitchen atorvastatin (LIPITOR) 40 MG tablet Take 1 tablet (40 mg total) by mouth at bedtime.  90 tablet  1  . B-D ULTRAFINE III SHORT PEN 31G X 8 MM MISC       . baclofen (LIORESAL) 10 MG tablet Take 0.5 tablets (5 mg total) by mouth 2 (two) times daily. Take 1/2 tablet  30 each  2  . benazepril (LOTENSIN) 10 MG tablet TAKE 1 TABLET BY MOUTH EVERY DAY  90 tablet  1  . diclofenac sodium (VOLTAREN) 1 % GEL Apply 1 application topically 4 (four) times daily as needed. For pain       . gabapentin (NEURONTIN) 800 MG tablet Take 800 mg by mouth 4 (four) times daily.      Marland Kitchen GLUCAGEN HYPOKIT 1 MG SOLR injection       . metoprolol succinate (TOPROL-XL) 25 MG 24 hr tablet Take 1.5 tablets (37.5 mg total) by mouth daily. Take 1.5 tablets (37.5 mg total) daily  135 tablet  1  . pantoprazole (PROTONIX) 40 MG tablet TAKE 1 TABLET BY MOUTH EVERY DAY AT 12 NOON  30 tablet  5  . insulin aspart protamine-insulin aspart  (NOVOLOG 70/30) (70-30) 100 UNIT/ML injection Inject 10-30 Units into the skin 2 (two) times daily with a meal. Take 30 units at breakfast and 10 units at supper  No current facility-administered medications on file prior to visit.    Allergies  Allergen Reactions  . Bee Venom Anaphylaxis    REVIEW OF SYSTEMS:  (Positives checked otherwise negative)  CARDIOVASCULAR:  []  chest pain, []  chest pressure, []  palpitations, []  shortness of breath when laying flat, []  shortness of breath with exertion,  []  pain in feet when walking, [x]  pain in feet when laying flat, []  history of blood clot in veins (DVT), []  history of phlebitis, []  swelling in legs, []  varicose veins  PULMONARY:  []  productive cough, []  asthma, []  wheezing  NEUROLOGIC:  [x]  multiple sclerosis, [x]  weakness in arms or legs, [x]  numbness in arms or legs, []  difficulty speaking or slurred speech, []  temporary loss of vision in one eye, []  dizziness  HEMATOLOGIC:  []  bleeding problems, []  problems with blood clotting too easily  MUSCULOSKEL:  []  joint pain, []  joint swelling, [x]  B AKA  GASTROINTEST:  []  vomiting blood, []  blood in stool     GENITOURINARY:  []  burning with urination, []  blood in urine  PSYCHIATRIC:  []  history of major depression  INTEGUMENTARY:  []  rashes, []  ulcers  Physical Examination  Filed Vitals:   08/13/13 1351  BP: 146/117  Pulse: 92  Height: 5\' 11"  (1.803 m)  Weight: 148 lb (67.132 kg)  SpO2: 99%   Body mass index is 20.65 kg/(m^2).  General: A&O x 3, WDWN  Eyes: PERRLA, EOMI  Pulmonary: Sym exp, good air movt, CTAB, no rales, rhonchi, & wheezing  Cardiac: RRR, Nl S1, S2, no Murmurs, rubs or gallops  Vascular: Vessel Right Left  Radial Palpable Palpable  Brachial Palpable Palpable  Carotid Palpable, without bruit Palpable, without bruit  Aorta Not palpable N/A  Femoral FaintlyPalpable Faintly Palpable  Popliteal AKA AKA  PT AKA AKA  DP AKA AKA   Gastrointestinal:  soft, NTND, -G/R, - HSM, - masses, - CVAT B  Musculoskeletal: M/S 5/5 throughout , B AKA, no erythema or TTP light touch  Neurologic: Pain and light touch intact in extremities , Motor exam as listed above  Non-Invasive Vascular Imaging Aortoiliac duplex (Date: 08/13/2013)  R: CIA not visualized, EIA patent with monophasic flow  L: CIA not visualized, EIA patent with monophasic flow  Medical Decision Making  DRISTEN BUDZYNSKI is a 73 y.o. male who presents with: s/p B AKA , possible thromboembolic episode , known aortoiliac disease   Based on the patient's vascular studies and examination, I have offered the patient: aortogram, bilateral pelvic angiogram, possible intervention.  In reviewing the previous angiograms, I don't see anything other than possible distal aortic stenosis <30% to account for only monophasic flow at the level of the external iliac artery.  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  His description of a prior episode of L thigh pain with color change is concerning for a possible thromboembolic event that spontaneous lysed.  Further diagnostic image thus is indicated. I discussed with the patient the nature of angiographic procedures, especially the limited patencies of any endovascular intervention.  The patient is aware of that the risks of an angiographic procedure include but are not limited to: bleeding, infection, access site complications, renal failure, embolization, rupture of vessel, dissection, possible need for emergent surgical intervention, possible need for surgical procedures to treat the patient's pathology, anaphylactic reaction to contrast, and stroke and death.   The patient is aware of the risks and agrees to proceed.  Thank you for allowing [place] to participate in this  patient's care.  Adele Barthel, MD [facility] Office: [phone] Pager: [phone]  08/13/2013, 2:28 PM

## 2013-08-24 ENCOUNTER — Telehealth: Payer: Self-pay | Admitting: *Deleted

## 2013-08-24 NOTE — Telephone Encounter (Signed)
Called and informed Jaime [name] with [facility]. JG//CMA

## 2013-08-24 NOTE — Telephone Encounter (Signed)
Received call from Debroah Loop, [place] at [facility]. She is requesting an order to continue DM management for 60 days. Please advise if ok to send order. JG//CMA  CB#: [phone]

## 2013-08-24 NOTE — Telephone Encounter (Signed)
DM under the care of Dr Chalmers Cater, please call her

## 2013-08-25 ENCOUNTER — Telehealth: Payer: Self-pay | Admitting: Internal Medicine

## 2013-08-25 NOTE — Telephone Encounter (Signed)
Relevant patient education mailed to patient.  

## 2013-08-26 ENCOUNTER — Telehealth: Payer: Self-pay | Admitting: *Deleted

## 2013-08-26 DIAGNOSIS — I2510 Atherosclerotic heart disease of native coronary artery without angina pectoris: Secondary | ICD-10-CM | POA: Diagnosis not present

## 2013-08-26 DIAGNOSIS — G35 Multiple sclerosis: Secondary | ICD-10-CM | POA: Diagnosis not present

## 2013-08-26 DIAGNOSIS — R339 Retention of urine, unspecified: Secondary | ICD-10-CM | POA: Diagnosis not present

## 2013-08-26 DIAGNOSIS — S78119A Complete traumatic amputation at level between unspecified hip and knee, initial encounter: Secondary | ICD-10-CM | POA: Diagnosis not present

## 2013-08-26 DIAGNOSIS — F0390 Unspecified dementia without behavioral disturbance: Secondary | ICD-10-CM | POA: Diagnosis not present

## 2013-08-26 DIAGNOSIS — L8992 Pressure ulcer of unspecified site, stage 2: Secondary | ICD-10-CM | POA: Diagnosis not present

## 2013-08-26 DIAGNOSIS — L89109 Pressure ulcer of unspecified part of back, unspecified stage: Secondary | ICD-10-CM | POA: Diagnosis not present

## 2013-08-26 DIAGNOSIS — N400 Enlarged prostate without lower urinary tract symptoms: Secondary | ICD-10-CM | POA: Diagnosis not present

## 2013-08-26 DIAGNOSIS — G547 Phantom limb syndrome without pain: Secondary | ICD-10-CM | POA: Diagnosis not present

## 2013-08-26 DIAGNOSIS — E119 Type 2 diabetes mellitus without complications: Secondary | ICD-10-CM | POA: Diagnosis not present

## 2013-09-09 DIAGNOSIS — R32 Unspecified urinary incontinence: Secondary | ICD-10-CM | POA: Diagnosis not present

## 2013-09-13 DIAGNOSIS — R32 Unspecified urinary incontinence: Secondary | ICD-10-CM | POA: Diagnosis not present

## 2013-09-13 DIAGNOSIS — N390 Urinary tract infection, site not specified: Secondary | ICD-10-CM | POA: Diagnosis not present

## 2013-09-23 NOTE — Telephone Encounter (Signed)
Error

## 2013-10-11 ENCOUNTER — Telehealth: Payer: Self-pay | Admitting: Internal Medicine

## 2013-10-11 NOTE — Telephone Encounter (Signed)
Advise patient: Nightmares are sometimes  symptoms of low blood sugar, I recommend them to take his blood sugar immediately after he has a nightmare and see if there is any relationship.  I reviewed his medications, he is not taking anything new, I wonder if codeine is causing some of the nightmares. Recommend to discuss her sugars are Dr. Chalmers Cater, his endocrinologist.

## 2013-10-11 NOTE — Telephone Encounter (Signed)
Please advise 

## 2013-10-11 NOTE — Telephone Encounter (Signed)
Elane from [facility] called and stated that Jaime [name] is still having those dreams were he is falling and there is a Tv at the end of the dream. Elane is not sure if it is from the medications dr Larose Kells has him on or if there is something else he can prescribe the patient for the nightmares. Also Elane stated that patent's wife does not check his blood sugar every day. His readings last week and today was: March 20, 210 March 21, 153 March 22, 274 March 23, 94

## 2013-10-12 NOTE — Telephone Encounter (Signed)
Called to discuss Dr. Ethel Rana recommendation with Margaretha Sheffield from [facility].  No answer.  Left message for call back.

## 2013-10-13 NOTE — Telephone Encounter (Signed)
Spoke with Margaretha Sheffield, [facility], and shared Dr. Ethel Rana recommendations.  She stated understanding.  No further questions or concerns voiced.    Also spoke with wife, Jaime [name], and shared the same recommendations with her.  She stated that she would start taking blood sugars after nightmares, record them in a book and will share them with Dr. Chalmers Cater.

## 2013-10-14 ENCOUNTER — Ambulatory Visit: Payer: Medicare Other | Admitting: Nurse Practitioner

## 2013-10-14 ENCOUNTER — Telehealth: Payer: Self-pay | Admitting: Internal Medicine

## 2013-10-14 DIAGNOSIS — G35 Multiple sclerosis: Secondary | ICD-10-CM

## 2013-10-14 NOTE — Telephone Encounter (Signed)
Patient wife called and stated that they would like a new neurologist because they are not pleased with the one they have. Also they would like a Male Neurologist. Please advise.

## 2013-10-14 NOTE — Telephone Encounter (Signed)
Referral reordered due to pts preference.

## 2013-10-28 ENCOUNTER — Other Ambulatory Visit: Payer: Self-pay

## 2013-10-31 ENCOUNTER — Other Ambulatory Visit: Payer: Self-pay | Admitting: Physical Medicine & Rehabilitation

## 2013-11-01 ENCOUNTER — Telehealth: Payer: Self-pay

## 2013-11-01 ENCOUNTER — Emergency Department (HOSPITAL_COMMUNITY): Payer: Medicare Other

## 2013-11-01 ENCOUNTER — Encounter (HOSPITAL_COMMUNITY): Payer: Self-pay | Admitting: Emergency Medicine

## 2013-11-01 ENCOUNTER — Emergency Department (HOSPITAL_COMMUNITY)
Admission: EM | Admit: 2013-11-01 | Discharge: 2013-11-01 | Disposition: A | Discharge status: Home / Self Care | Payer: Medicare Other | Attending: Emergency Medicine | Admitting: Emergency Medicine

## 2013-11-01 DIAGNOSIS — S99919A Unspecified injury of unspecified ankle, initial encounter: Secondary | ICD-10-CM | POA: Diagnosis not present

## 2013-11-01 DIAGNOSIS — Z794 Long term (current) use of insulin: Secondary | ICD-10-CM | POA: Insufficient documentation

## 2013-11-01 DIAGNOSIS — S78119A Complete traumatic amputation at level between unspecified hip and knee, initial encounter: Secondary | ICD-10-CM | POA: Diagnosis not present

## 2013-11-01 DIAGNOSIS — I10 Essential (primary) hypertension: Secondary | ICD-10-CM | POA: Insufficient documentation

## 2013-11-01 DIAGNOSIS — Z87891 Personal history of nicotine dependence: Secondary | ICD-10-CM | POA: Insufficient documentation

## 2013-11-01 DIAGNOSIS — Z860100 Personal history of colon polyps, unspecified: Secondary | ICD-10-CM | POA: Insufficient documentation

## 2013-11-01 DIAGNOSIS — I2510 Atherosclerotic heart disease of native coronary artery without angina pectoris: Secondary | ICD-10-CM | POA: Insufficient documentation

## 2013-11-01 DIAGNOSIS — Z87448 Personal history of other diseases of urinary system: Secondary | ICD-10-CM | POA: Diagnosis not present

## 2013-11-01 DIAGNOSIS — Z9861 Coronary angioplasty status: Secondary | ICD-10-CM | POA: Insufficient documentation

## 2013-11-01 DIAGNOSIS — Z8709 Personal history of other diseases of the respiratory system: Secondary | ICD-10-CM | POA: Diagnosis not present

## 2013-11-01 DIAGNOSIS — E119 Type 2 diabetes mellitus without complications: Secondary | ICD-10-CM | POA: Diagnosis not present

## 2013-11-01 DIAGNOSIS — I252 Old myocardial infarction: Secondary | ICD-10-CM | POA: Diagnosis not present

## 2013-11-01 DIAGNOSIS — R6889 Other general symptoms and signs: Secondary | ICD-10-CM | POA: Diagnosis not present

## 2013-11-01 DIAGNOSIS — I119 Hypertensive heart disease without heart failure: Secondary | ICD-10-CM | POA: Diagnosis not present

## 2013-11-01 DIAGNOSIS — Z8669 Personal history of other diseases of the nervous system and sense organs: Secondary | ICD-10-CM | POA: Diagnosis not present

## 2013-11-01 DIAGNOSIS — E785 Hyperlipidemia, unspecified: Secondary | ICD-10-CM | POA: Diagnosis not present

## 2013-11-01 DIAGNOSIS — Z79899 Other long term (current) drug therapy: Secondary | ICD-10-CM | POA: Diagnosis not present

## 2013-11-01 DIAGNOSIS — S8990XA Unspecified injury of unspecified lower leg, initial encounter: Secondary | ICD-10-CM | POA: Diagnosis not present

## 2013-11-01 DIAGNOSIS — M79609 Pain in unspecified limb: Secondary | ICD-10-CM | POA: Insufficient documentation

## 2013-11-01 DIAGNOSIS — I5020 Unspecified systolic (congestive) heart failure: Secondary | ICD-10-CM | POA: Insufficient documentation

## 2013-11-01 DIAGNOSIS — Z7982 Long term (current) use of aspirin: Secondary | ICD-10-CM | POA: Insufficient documentation

## 2013-11-01 DIAGNOSIS — Z87440 Personal history of urinary (tract) infections: Secondary | ICD-10-CM | POA: Diagnosis not present

## 2013-11-01 DIAGNOSIS — G546 Phantom limb syndrome with pain: Secondary | ICD-10-CM

## 2013-11-01 LAB — URINALYSIS, ROUTINE W REFLEX MICROSCOPIC
Bilirubin Urine: NEGATIVE
Glucose, UA: NEGATIVE mg/dL
Ketones, ur: NEGATIVE mg/dL
NITRITE: NEGATIVE
PROTEIN: NEGATIVE mg/dL
Specific Gravity, Urine: 1.022 (ref 1.005–1.030)
Urobilinogen, UA: 1 mg/dL (ref 0.0–1.0)
pH: 5.5 (ref 5.0–8.0)

## 2013-11-01 LAB — CBC WITH DIFFERENTIAL/PLATELET
Basophils Absolute: 0 10*3/uL (ref 0.0–0.1)
Basophils Relative: 0 % (ref 0–1)
EOS ABS: 0.2 10*3/uL (ref 0.0–0.7)
Eosinophils Relative: 3 % (ref 0–5)
HCT: 44.1 % (ref 39.0–52.0)
Hemoglobin: 14.5 g/dL (ref 13.0–17.0)
LYMPHS ABS: 2.4 10*3/uL (ref 0.7–4.0)
Lymphocytes Relative: 35 % (ref 12–46)
MCH: 26.4 pg (ref 26.0–34.0)
MCHC: 32.9 g/dL (ref 30.0–36.0)
MCV: 80.3 fL (ref 78.0–100.0)
MONO ABS: 0.6 10*3/uL (ref 0.1–1.0)
MONOS PCT: 9 % (ref 3–12)
NEUTROS PCT: 53 % (ref 43–77)
Neutro Abs: 3.6 10*3/uL (ref 1.7–7.7)
Platelets: 218 10*3/uL (ref 150–400)
RBC: 5.49 MIL/uL (ref 4.22–5.81)
RDW: 17.1 % — ABNORMAL HIGH (ref 11.5–15.5)
WBC: 6.8 10*3/uL (ref 4.0–10.5)

## 2013-11-01 LAB — URINE MICROSCOPIC-ADD ON

## 2013-11-01 LAB — COMPREHENSIVE METABOLIC PANEL
ALT: 24 U/L (ref 0–53)
AST: 17 U/L (ref 0–37)
Albumin: 3.3 g/dL — ABNORMAL LOW (ref 3.5–5.2)
Alkaline Phosphatase: 81 U/L (ref 39–117)
BILIRUBIN TOTAL: 0.4 mg/dL (ref 0.3–1.2)
BUN: 18 mg/dL (ref 6–23)
CHLORIDE: 103 meq/L (ref 96–112)
CO2: 24 mEq/L (ref 19–32)
CREATININE: 0.65 mg/dL (ref 0.50–1.35)
Calcium: 9.6 mg/dL (ref 8.4–10.5)
GFR calc non Af Amer: >90 mL/min (ref >90)
GLUCOSE: 83 mg/dL (ref 70–99)
POTASSIUM: 3.9 meq/L (ref 3.7–5.3)
Sodium: 139 mEq/L (ref 137–147)
TOTAL PROTEIN: 7.4 g/dL (ref 6.0–8.3)

## 2013-11-01 LAB — CBG MONITORING, ED: Glucose-Capillary: 71 mg/dL (ref 70–99)

## 2013-11-01 MED ORDER — OXYCODONE-ACETAMINOPHEN 5-325 MG PO TABS: 1.0000 | ORAL_TABLET | Freq: Four times a day (QID) | ORAL | Status: DC | PRN

## 2013-11-01 MED ORDER — DIAZEPAM 5 MG PO TABS
10.0000 mg | ORAL_TABLET | Freq: Once | ORAL | Status: AC
  Administered 2013-11-01: 10 mg via ORAL
  Filled 2013-11-01: qty 2

## 2013-11-01 MED ORDER — OXYCODONE-ACETAMINOPHEN 5-325 MG PO TABS
1.0000 | ORAL_TABLET | Freq: Once | ORAL | Status: AC
  Administered 2013-11-01: 1 via ORAL
  Filled 2013-11-01: qty 1

## 2013-11-01 MED ORDER — DIAZEPAM 10 MG PO TABS: 10.0000 mg | ORAL_TABLET | Freq: Three times a day (TID) | ORAL | Status: DC | PRN

## 2013-11-01 NOTE — Telephone Encounter (Signed)
Patient's wife called and said that the patient is in extreme pain. She said he is "moaning" with both amputees hurting. Advise patient that they may not receive a call back today considering the office closes at 4:30 pm. Patient's wife said she would call the ambulance to take the patient to the hospital if need be because he is in pain.

## 2013-11-01 NOTE — Discharge Instructions (Signed)
Phantom Limb Pain Phantom limb pain occurs in an arm or leg following an amputation. It is pain in an extremity that no longer exists. This pain varies with different patients. Different activities may cause the pain. Some people with an amputated limb experience the opposite of phantom pain, which is phantom pleasure.  The trouble may start in a part of the brain known as the sensory cortex. The sensory cortex is the portion of your brain that processes sensations from the rest of your body. It is hypothesized that when a body part is lost, the corresponding part of the brain is not able to handle the loss and rewires its circuitry to make up for the signals it no longer receives from the missing extremity. The exact mechanism of how phantom limb pain occurs is not known. The severity of pain seems to be correlated with personal problems such as stress and attitude. It also seems to correlate with the amount of pain a person had before the operation. CAUSES   Damaged nerve endings.  Scar tissue at the amputation site. TREATMENT  Phantom limb pain can be severe and debilitating. Most cases of phantom limb pain only last briefly. There are a number of different therapies and medications that may give relief. Keep working with your health care provider until relief is obtained. Some treatments that may be helpful include:  Hypnosis and mental imagery. Their techniques can give patients the needed impetus to recognize their ability to regain control.  Biofeedback.  Relaxation techniques. They are related to hypnosis techniques and use the mind and body to control pain.  Acupuncture.  Massage.  Exercise.  Antidepressant medicine.  Anticonvulsant medicine.  Narcotics or pain medicines. SEEK MEDICAL CARE IF: Pain is not relieved or increases. Document Released: 09/28/2002 Document Revised: 03/10/2013 Document Reviewed: 12/08/2012 [facility] Patient Information 2014 [place].

## 2013-11-01 NOTE — ED Notes (Signed)
[facility] was called for pt's transportation. 

## 2013-11-01 NOTE — ED Notes (Signed)
Bed: WA10 Expected date:  Expected time:  Means of arrival:  Comments: ems 

## 2013-11-01 NOTE — Telephone Encounter (Signed)
Pt was supposed to follow up in 3 months after Jan appt.  Please make sure pt has appt to see me this mo,  Also find out if he is out of pain medication and we can call in bridge of Tylenol #4

## 2013-11-01 NOTE — ED Provider Notes (Addendum)
CSN: [id number]     Arrival date & time 11/01/13  1835 History   First MD Initiated Contact with Patient 11/01/13 1856     Chief Complaint  Patient presents with  . Extremity Pain     (Consider location/radiation/quality/duration/timing/severity/associated sxs/prior Treatment) HPI Comments: Pt presenting with phantom leg pain on the right all day starting at 9am that last appx 1-2 min and is severe 10/10 and then resolves for 10-15 min and then returns.  All at  The end of stump and no swelling, trauma or drainage.  States never had stump pain like this.  Recent treatment for UTI 2 weeks ago and finished treatment about 1 week ago.  No back pain or abd pain.  NO numbness and chronic weakness from MS.  Baclofen and tylenol #3 not helping.  The history is provided by the patient and the spouse.    Past Medical History  Diagnosis Date  . Hyperlipidemia   . Cardiomyopathy, ischemic     EF 45% per ECHO 2008  //   EF 25%, echo, August, 2013  . Pulmonary hypertension     moderate ECHO Jan 2008  . Systolic heart failure   . Multiple sclerosis [place] -- LAST VISIT 11-20-2010  NOTE W/ CHART  . Lung nodule     resolved 11-2006 CT Chest  . Tobacco abuse   . Increased prostate specific antigen (PSA) velocity   . Urinary retention     dx ~ 2-12, like from [place], now with a catheter, saw urology  . Carotid artery disease     Doppler, February, 2012, 0-39% bilateral,Mild smooth plaque  . Chronic indwelling Foley catheter   . Scoliosis associated with other condition   . Hemiparesis   . Fatigue SEVERE  . Impotence   . MI, acute, non ST segment elevation 2007    S/P PCI WITH X1 STENT (TAXUS DRUG-ELUTING) LEFT CIRCUMFLEX  . Diabetes mellitus     INSULIN-DEPENDANT  . Hypertension   . H/O pleural effusion 2008    POST THORACENTESIS  . History of colon polyps PRECANCEROUS  . Insomnia   . CAD (coronary artery disease) CARDIOLOGIST- DR KATZ-- VISIT 06-05-2011 IN [facility]     non-STEMI, 2007.Marland Kitchenoccluded circumflex.. Taxus stent placed...residual 80% LAD...50% RCA  . Urinary tract infection     hx of  . Atherosclerotic PVD with ulceration     left foot  . PAC (premature atrial contraction)     December, 2013   Past Surgical History  Procedure Laterality Date  . Hernia repair  1990    (R)  . Thoracentesis  2008    PLEURAL EFFUSION  . Cystoscopy  07/30/2011    Procedure: CYSTOSCOPY;  Surgeon: Hanley Ben, MD;  Location[facility];  Service: Urology;  Laterality: N/A;  . Transurethral resection of prostate  07/30/2011    Procedure: TRANSURETHRAL RESECTION OF THE PROSTATE (TURP);  Surgeon: Hanley Ben, MD;  Location: [facility];  Service: Urology;  Laterality: N/A;  . Femoral-tibial bypass graft  03/11/2012    Procedure: BYPASS GRAFT FEMORAL-TIBIAL ARTERY;  Surgeon: Conrad [place], MD;  Location: [place];  Service: Vascular;  Laterality: Left;  Left Femoral -Tibial trunk bypass, Endarterectomy of Tibial- Peroneal trunk with vein angioplasty.  . Intraoperative arteriogram  03/11/2012    Procedure: INTRA OPERATIVE ARTERIOGRAM;  Surgeon: Conrad Wolford, MD;  Location: [place];  Service: Vascular;  Laterality: Left;  . Femoral-popliteal bypass graft  03/11/2012    Procedure: BYPASS GRAFT FEMORAL-POPLITEAL ARTERY;  Surgeon: Conrad [place], MD;  Location: [place];  Service: Vascular;  Laterality: Left;  embolectomy left lower leg  . Amputation  03/17/2012    Procedure: AMPUTATION ABOVE KNEE;  Surgeon: Conrad [place]mor[place], MD;  Location: [place];  Service: Vascular;  Laterality: Left;  . Coronary angioplasty with stent placement  05-01-2006    OCCLUDED CIRCUMFLEX -- TAXUS STENT PLACMENT  AND RESIDUAL 80% LAD,  50% RCA  . Amputation  06/03/2012    Procedure: AMPUTATION ABOVE KNEE;  Surgeon: Conrad [place], MD;  Location: [place];  Service: Vascular;  Laterality: Right;   Family History  Problem Relation Age of Onset  . Hypertension    . Heart attack Mother  70  . Heart disease Mother   . Stroke Mother   . Hyperlipidemia Mother   . Hypertension Mother   . Colon cancer Neg Hx   . Prostate cancer Neg Hx   . COPD Father   . Peripheral vascular disease Father   . Diabetes Brother   . Heart disease Brother   . Hypertension Brother   . Heart attack Brother   . Diabetes Daughter    History  Substance Use Topics  . Smoking status: Former Smoker -- 70 years  . Smokeless tobacco: Former Systems developer    Quit date: 08/05/2009     Comment: pt states that he is using E-cigs  . Alcohol Use: No    Review of Systems  All other systems reviewed and are negative.     Allergies  Bee venom  Home Medications   Current Outpatient Rx  Name  Route  Sig  Dispense  Refill  . acetaminophen-codeine (TYLENOL #4) 300-60 MG per tablet   Oral   Take 1 tablet by mouth every 8 (eight) hours as needed for moderate pain.   30 tablet   0   . amantadine (SYMMETREL) 100 MG capsule   Oral   Take 1 capsule (100 mg total) by mouth 3 (three) times daily.   90 capsule   3   . aspirin EC 325 MG tablet   Oral   Take 325 mg by mouth daily.         Marland Kitchen atorvastatin (LIPITOR) 40 MG tablet   Oral   Take 1 tablet (40 mg total) by mouth at bedtime.   90 tablet   1   . baclofen (LIORESAL) 10 MG tablet   Oral   Take 5 mg by mouth 2 (two) times daily.         . benazepril (LOTENSIN) 10 MG tablet   Oral   Take 10 mg by mouth daily.         . diclofenac sodium (VOLTAREN) 1 % GEL   Topical   Apply 1 application topically 4 (four) times daily as needed. For pain          . glucagon (GLUCAGEN) 1 MG SOLR injection   Intravenous   Inject 1 mg into the vein once as needed for low blood sugar.         . insulin aspart protamine- aspart (NOVOLOG MIX 70/30) (70-30) 100 UNIT/ML injection   Subcutaneous   Inject 15-35 Units into the skin 2 (two) times daily with a meal. Take 35 units in the morning with breakfast and take 15 units at supper         .  metoprolol succinate (TOPROL-XL) 25 MG 24 hr tablet  Oral   Take 1.5 tablets (37.5 mg total) by mouth daily. Take 1.5 tablets (37.5 mg total) daily   135 tablet   1   . pantoprazole (PROTONIX) 40 MG tablet   Oral   Take 40 mg by mouth daily at 12 noon.          BP 138/99  Pulse 88  Temp(Src) 98.9 F (37.2 C) (Oral)  Resp 18  SpO2 97% Physical Exam  Nursing note and vitals reviewed. Constitutional: He is oriented to person, place, and time. He appears well-developed and well-nourished. No distress.  HENT:  Head: Normocephalic and atraumatic.  Mouth/Throat: Oropharynx is clear and moist.  Eyes: Conjunctivae and EOM are normal. Pupils are equal, round, and reactive to light.  Neck: Normal range of motion. Neck supple.  Cardiovascular: Normal rate, regular rhythm and intact distal pulses.   No murmur heard. Pulmonary/Chest: Effort normal and breath sounds normal. No respiratory distress. He has no wheezes. He has no rales.  Abdominal: Soft. He exhibits no distension. There is no tenderness. There is no rebound and no guarding.  Genitourinary:  Condom cath in place with clear yellow urine  Musculoskeletal: Normal range of motion. He exhibits no edema and no tenderness.  Bilateral AKA.  Stumps are normal temp without erythema, swelling or drainage.  No sacral breakdown  Neurological: He is alert and oriented to person, place, and time.  Normal sensation and 0/5 strength in the lower ext  Skin: Skin is warm and dry. No rash noted. No erythema.  Psychiatric: He has a normal mood and affect. His behavior is normal.    ED Course  Procedures (including critical care time) Labs Review Labs Reviewed  CBC WITH DIFFERENTIAL - Abnormal; Notable for the following:    RDW 17.1 (*)    All other components within normal limits  COMPREHENSIVE METABOLIC PANEL - Abnormal; Notable for the following:    Albumin 3.3 (*)    All other components within normal limits  URINALYSIS, ROUTINE W  REFLEX MICROSCOPIC - Abnormal; Notable for the following:    APPearance CLOUDY (*)    Hgb urine dipstick SMALL (*)    Leukocytes, UA LARGE (*)    All other components within normal limits  URINE MICROSCOPIC-ADD ON - Abnormal; Notable for the following:    Bacteria, UA MANY (*)    All other components within normal limits  URINE CULTURE   Imaging Review Dg Femur Right  11/01/2013   CLINICAL DATA:  Leg pain  EXAM: RIGHT FEMUR - 2 VIEW  COMPARISON:  None.  FINDINGS: There are changes consistent with an above the knee amputation. No acute fracture or dislocation is noted. Diffuse vascular calcifications are seen. No acute abnormality is noted.  IMPRESSION: Postsurgical changes.  No acute abnormality seen.   Electronically Signed   By: Inez Catalina M.D.   On: 11/01/2013 20:30     EKG Interpretation None      MDM   Final diagnoses:  Phantom limb pain    Pt presenting with severe phantom leg pain today for unknown reasons.  Pt's stump without signs of infection, swelling or drainage.  Denies trauma.  Pt states recent UTI but finished meds.  Denies back pain or radiation of pain to the leg.  No abd pain, n/v.  Pt well appearing with intermittent pain.  Normal VS.  No skin break down and low suspicion for spine pathology.  Pt tried tylenol #3 and baclofen without improvement.  CBC, CMP, UA, a  plain film of the stump pending.  Pt given valium and percocet.  9:01 PM Labs normal except for wbc's in urine but recently had treatment will just sent culture.  Pt feeling much better after percocet and valium.  Will d/c home with same and pt will f/u with pcp if sx worsen.  Blanchie Dessert, MD 11/01/13 [id number]  Blanchie Dessert, MD 11/01/13 2104

## 2013-11-01 NOTE — ED Notes (Signed)
Pt presented by EMS, c/o of sharp shooting pain in his right trump/extremity, pt has bilateral above knee amputation. Rates his pain 10/10. On assessment pt has mild sweeling and tenderness, denies trauma stating the pain is idiopathic, onset 3 days ago and has been intermittent since onset. Pt in mild pain induced distress.

## 2013-11-02 ENCOUNTER — Ambulatory Visit: Payer: Medicare Other | Admitting: Physical Medicine & Rehabilitation

## 2013-11-02 NOTE — Telephone Encounter (Signed)
Patients wife ended up taking him to the hospital.  The hospital gave him some oxycodone.  He is feeling better.

## 2013-11-03 LAB — URINE CULTURE: Colony Count: >=100000

## 2013-11-05 ENCOUNTER — Encounter: Payer: Self-pay | Admitting: Neurology

## 2013-11-05 ENCOUNTER — Ambulatory Visit (INDEPENDENT_AMBULATORY_CARE_PROVIDER_SITE_OTHER): Payer: Medicare Other | Admitting: Neurology

## 2013-11-05 VITALS — BP 130/86 | HR 60 | Resp 16

## 2013-11-05 DIAGNOSIS — G35 Multiple sclerosis: Secondary | ICD-10-CM | POA: Diagnosis not present

## 2013-11-05 DIAGNOSIS — I2510 Atherosclerotic heart disease of native coronary artery without angina pectoris: Secondary | ICD-10-CM

## 2013-11-05 DIAGNOSIS — G547 Phantom limb syndrome without pain: Secondary | ICD-10-CM | POA: Diagnosis not present

## 2013-11-05 DIAGNOSIS — G546 Phantom limb syndrome with pain: Secondary | ICD-10-CM

## 2013-11-05 MED ORDER — GABAPENTIN 300 MG PO CAPS: 300.0000 mg | ORAL_CAPSULE | Freq: Three times a day (TID) | ORAL | Status: DC

## 2013-11-05 NOTE — Progress Notes (Signed)
NEUROLOGY CONSULTATION NOTE  Jaime [name] MRN: [id number] DOB: October 26, 1940  Referring provider: Dr. Larose Kells Primary care provider: Dr. Larose Kells  Reason for consult:  MS  HISTORY OF PRESENT ILLNESS: Jaime [name] is a 73 year old right-handed man with diabetes with bilateral above the knee amputations, hyperlipidemia and hypertension who presents for secondary progressive multiple sclerosis.  Records and CT personally reviewed.  Records and images were personally reviewed where available.  He was diagnosed with MS in 1993.  Initially, he developed right arm and leg weakness along with balance problems.  Symptoms initially improved but had progressed over the years.  He was on multiple immunomodulating agents such as Betaseron, Copaxone, Cytoxin and monthly Solumedrol.  He currently is not on an agent and hasn't had a flare up in 4 years.  He has bilateral ATK ambutations performed in 2013 as a consequence of his peripheral vascular and diabetes.  He requires assistance with bathing and dressing, but he is able to feed himself.  He uses a power wheelchair.  He has been on amantadine for many years.  MRI Brain w/wo from 11/29/04 demonstrated "extensive white matter disease in the supratentorial compartment, which is consistent with advanced multiple sclerosis.  There is confluence of the white matter lesions with atrophy and hydrocephalus, thinned corpus callosum, and black holes, however somewhat unusual for MS of this degree, no infrantentorial lesions are seen and no active lesions are identified by means of contrast." CT of head was performed on 03/23/12 for encephalopathy, which revealed atrophy and nonspecific small vessel ischemic changes.  He has [place].  More recently, he had been experiencing vivid nightmares.  It was believed to be due to low blood sugars or codeine.  On 11/01/13, he presented to the ED with severe right phantom limb pain, described as muscle spasms like a Charley  horse, that was intermittent and lasting 10 minutes at a time.  Labs were unremarkable, except for WBCs in the urine, however he was recently treated for UTI.  He was given Percocet and Valium, which helped.  Tylenol 3 and 4 do not help.  He currently is taking Percocet which helps.  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  He has some poor short-term memory.  Sometimes he calls his wife by his mother's name.  Medications include:  Tylenol #4, amantadine 100mg  three times daily, baclofen 5mg  twice daily, Voltaren 1% dgel, Percocet 5-325mg  every 6 hours as needed, Novolog, glucagon, benazepril, Lipitor 40mg  ASA 325mg  and Protonix.Marland Kitchen  He used to take gabapentin 800mg  three times daily for general pain in the legs, but he had discontinued this before recent onset of severe phantom limb pain.  PAST MEDICAL HISTORY: Past Medical History  Diagnosis Date  . Hyperlipidemia   . Cardiomyopathy, ischemic     EF 45% per ECHO 2008  //   EF 25%, echo, August, 2013  . Pulmonary hypertension     moderate ECHO Jan 2008  . Systolic heart failure   . Multiple sclerosis [place] -- LAST VISIT 11-20-2010  NOTE W/ CHART  . Lung nodule     resolved 11-2006 CT Chest  . Tobacco abuse   . Increased prostate specific antigen (PSA) velocity   . Urinary retention     dx ~ 2-12, like from [place], now with a catheter, saw urology  . Carotid artery disease     Doppler, February, 2012, 0-39% bilateral,Mild smooth plaque  .  Chronic indwelling Foley catheter   . Scoliosis associated with other condition   . Hemiparesis   . Fatigue SEVERE  . Impotence   . MI, acute, non ST segment elevation 2007    S/P PCI WITH X1 STENT (TAXUS DRUG-ELUTING) LEFT CIRCUMFLEX  . Diabetes mellitus     INSULIN-DEPENDANT  . Hypertension   . H/O pleural effusion 2008    POST THORACENTESIS  . History of colon polyps PRECANCEROUS  . Insomnia   . CAD (coronary artery disease) CARDIOLOGIST- DR KATZ-- VISIT  06-05-2011 IN [facility]    non-STEMI, 2007.Marland Kitchenoccluded circumflex.. Taxus stent placed...residual 80% LAD...50% RCA  . Urinary tract infection     hx of  . Atherosclerotic PVD with ulceration     left foot  . PAC (premature atrial contraction)     December, 2013    PAST SURGICAL HISTORY: Past Surgical History  Procedure Laterality Date  . Hernia repair  1990    (R)  . Thoracentesis  2008    PLEURAL EFFUSION  . Cystoscopy  07/30/2011    Procedure: CYSTOSCOPY;  Surgeon: Hanley Ben, MD;  Location: [facility];  Service: Urology;  Laterality: N/A;  . Transurethral resection of prostate  07/30/2011    Procedure: TRANSURETHRAL RESECTION OF THE PROSTATE (TURP);  Surgeon: Hanley Ben, MD;  Location: [facility];  Service: Urology;  Laterality: N/A;  . Femoral-tibial bypass graft  03/11/2012    Procedure: BYPASS GRAFT FEMORAL-TIBIAL ARTERY;  Surgeon: Conrad [place], MD;  Location: [place];  Service: Vascular;  Laterality: Left;  Left Femoral -Tibial trunk bypass, Endarterectomy of Tibial- Peroneal trunk with vein angioplasty.  . Intraoperative arteriogram  03/11/2012    Procedure: INTRA OPERATIVE ARTERIOGRAM;  Surgeon: Conrad [place], MD;  Location: [place];  Service: Vascular;  Laterality: Left;  . Femoral-popliteal bypass graft  03/11/2012    Procedure: BYPASS GRAFT FEMORAL-POPLITEAL ARTERY;  Surgeon: Conrad [place], MD;  Location: [place];  Service: Vascular;  Laterality: Left;  embolectomy left lower leg  . Amputation  03/17/2012    Procedure: AMPUTATION ABOVE KNEE;  Surgeon: Conrad [place], MD;  Location: [place];  Service: Vascular;  Laterality: Left;  . Coronary angioplasty with stent placement  05-01-2006    OCCLUDED CIRCUMFLEX -- TAXUS STENT PLACMENT  AND RESIDUAL 80% LAD,  50% RCA  . Amputation  06/03/2012    Procedure: AMPUTATION ABOVE KNEE;  Surgeon: Conrad Wales, MD;  Location: [place];  Service: Vascular;  Laterality: Right;    MEDICATIONS: Current Outpatient  Prescriptions on File Prior to Visit  Medication Sig Dispense Refill  . acetaminophen-codeine (TYLENOL #4) 300-60 MG per tablet Take 1 tablet by mouth every 8 (eight) hours as needed for moderate pain.  30 tablet  0  . amantadine (SYMMETREL) 100 MG capsule Take 1 capsule (100 mg total) by mouth 3 (three) times daily.  90 capsule  3  . aspirin EC 325 MG tablet Take 325 mg by mouth daily.      Marland Kitchen atorvastatin (LIPITOR) 40 MG tablet Take 1 tablet (40 mg total) by mouth at bedtime.  90 tablet  1  . baclofen (LIORESAL) 10 MG tablet Take 5 mg by mouth 2 (two) times daily.      . benazepril (LOTENSIN) 10 MG tablet Take 10 mg by mouth daily.      . diazepam (VALIUM) 10 MG tablet Take 1 tablet (10 mg total) by mouth every 8 (eight) hours as needed for muscle spasms (phantom  limb pain).  15 tablet  0  . diclofenac sodium (VOLTAREN) 1 % GEL Apply 1 application topically 4 (four) times daily as needed. For pain       . glucagon (GLUCAGEN) 1 MG SOLR injection Inject 1 mg into the vein once as needed for low blood sugar.      . insulin aspart protamine- aspart (NOVOLOG MIX 70/30) (70-30) 100 UNIT/ML injection Inject 15-35 Units into the skin 2 (two) times daily with a meal. Take 35 units in the morning with breakfast and take 15 units at supper      . metoprolol succinate (TOPROL-XL) 25 MG 24 hr tablet Take 1.5 tablets (37.5 mg total) by mouth daily. Take 1.5 tablets (37.5 mg total) daily  135 tablet  1  . oxyCODONE-acetaminophen (PERCOCET/ROXICET) 5-325 MG per tablet Take 1 tablet by mouth every 6 (six) hours as needed for severe pain.  20 tablet  0  . pantoprazole (PROTONIX) 40 MG tablet Take 40 mg by mouth daily at 12 noon.       No current facility-administered medications on file prior to visit.    ALLERGIES: Allergies  Allergen Reactions  . Bee Venom Anaphylaxis    FAMILY HISTORY: Family History  Problem Relation Age of Onset  . Hypertension    . Heart attack Mother 3  . Heart disease Mother     . Stroke Mother   . Hyperlipidemia Mother   . Hypertension Mother   . Colon cancer Neg Hx   . Prostate cancer Neg Hx   . COPD Father   . Peripheral vascular disease Father   . Diabetes Brother   . Heart disease Brother   . Hypertension Brother   . Heart attack Brother   . Diabetes Daughter     SOCIAL HISTORY: History   Social History  . Marital Status: Married    Spouse Name: N/A    Number of Children: 2  . Years of Education: N/A   Occupational History  . disable    Social History Main Topics  . Smoking status: Former Smoker -- 69 years  . Smokeless tobacco: Former Systems developer    Quit date: 08/05/2009     Comment: pt states that he is using E-cigs  . Alcohol Use: No  . Drug Use: No  . Sexual Activity: Not on file     Comment: electronic cigarettes   Other Topics Concern  . Not on file   Social History Narrative   Lives w/ wife    REVIEW OF SYSTEMS: Constitutional: No fevers, chills, or sweats, no generalized fatigue, change in appetite Eyes: No visual changes, double vision, eye pain Ear, nose and throat: No hearing loss, ear pain, nasal congestion, sore throat Cardiovascular: No chest pain, palpitations Respiratory:  No shortness of breath at rest or with exertion, wheezes GastrointestinaI: No nausea, vomiting, diarrhea, abdominal pain, fecal incontinence Genitourinary:  No dysuria, urinary retention or frequency Musculoskeletal:  No neck pain, back pain Integumentary: still has scabs at amputation sites Neurological: as above Psychiatric: No depression, insomnia, anxiety Endocrine: No palpitations, fatigue, diaphoresis, mood swings, change in appetite, change in weight, increased thirst Hematologic/Lymphatic:  No anemia, purpura, petechiae. Allergic/Immunologic: no itchy/runny eyes, nasal congestion, recent allergic reactions, rashes  PHYSICAL EXAM: Filed Vitals:   11/05/13 1252  BP: 130/86  Pulse: 60  Resp: 16   General: No acute distress Head:   Normocephalic/atraumatic Neck: supple, no paraspinal tenderness, full range of motion Back: No paraspinal tenderness Heart: regular rate and rhythm  Lungs: Clear to auscultation bilaterally. Vascular: No carotid bruits. Neurological Exam: Mental status: alert and oriented to person, place, and time (except date), recent and remote memory intact, fund of knowledge intact, attention and concentration intact, speech fluent and not dysarthric, language intact. Cranial nerves: CN I: not tested CN II: pupils equal, round and reactive to light, visual fields intact, fundi unremarkable, without vessel changes, exudates, hemorrhages or papilledema. CN III, IV, VI:  full range of motion, no nystagmus, no ptosis CN V: facial sensation intact CN VII: upper and lower face symmetric CN VIII: hearing intact CN IX, X: gag intact, uvula midline CN XI: sternocleidomastoid and trapezius muscles intact CN XII: tongue midline Bulk & Tone: normal, no fasciculations. Motor: 5-/5 right deltoid and right triceps, otherwise 5/5 in upper extremities. Sensation: temperature and vibration intact in upper extremities. Deep Tendon Reflexes: 2+ and symmetric in upper extremities. Finger to nose testing: slow but no dysmetria Heel to shin: ATK amputations Gait: wheelchair-bound.  IMPRESSION: History of secondary progressive MS, stable Phantom limb pain  PLAN: 1.  Will try discontinuing amantadine.  This may contribute to nightmares and he may not need it anymore. 2.  Will restart gabapentin at 300mg  three times daily. If he does not tolerate, then he may reduce dose to 1 tablet at bedtime and increase by 1 tablet every week to goal of 1 tablet three times daily.  Other options include Lyrica or Cymbalta. 3.  Follow up with pain management.  45 minutes spent with patient, over 50% spent counseling and coordinating care.  Thank you for allowing me to take part in the care of this patient.  Metta Clines, DO  CC:    [name], MD

## 2013-11-05 NOTE — Patient Instructions (Signed)
1.  Restart gabapentin 300mg  three times daily for phantom limb pain.  If it causes too much drowsiness or dizziness, cut back to 1 tablet at bedtime for 7 days, then increase to 1 tablet twice daily for 7 days, then increase to 1 tablet 3 times daily.  Call in 4 weeks with update. 2.  Stop the amantadine.  This may cause nightmares and you may not need it anymore anyway. 3.  Follow up in 3 months.

## 2013-11-07 ENCOUNTER — Telehealth (HOSPITAL_BASED_OUTPATIENT_CLINIC_OR_DEPARTMENT_OTHER): Payer: Self-pay | Admitting: Emergency Medicine

## 2013-11-08 ENCOUNTER — Telehealth: Payer: Self-pay | Admitting: Neurology

## 2013-11-08 ENCOUNTER — Telehealth: Payer: Self-pay | Admitting: *Deleted

## 2013-11-08 NOTE — Telephone Encounter (Signed)
Received fax from [facility] stating that patient has been discharged because all goals have been met. JG//CMA

## 2013-11-08 NOTE — Telephone Encounter (Signed)
Pt wants the paper work she brought in to be sent to Dr Larose Kells office

## 2013-11-09 ENCOUNTER — Telehealth: Payer: Self-pay | Admitting: *Deleted

## 2013-11-09 NOTE — Telephone Encounter (Signed)
Please see below note

## 2013-11-09 NOTE — Telephone Encounter (Signed)
Medication List and Allergies: Reviewed and updated  90 day supply/mail order: [facility]  Immunizations Due: Td, Zostavax  A/P  No changes to FH, PSH or Personal Hx Flu Vaccine--refused CCS within past fw years per wife (showed polyps) PSA--not on file  To discuss with provider: Stomach Pain

## 2013-11-10 ENCOUNTER — Ambulatory Visit (INDEPENDENT_AMBULATORY_CARE_PROVIDER_SITE_OTHER): Payer: Medicare Other | Admitting: Internal Medicine

## 2013-11-10 ENCOUNTER — Encounter: Payer: Self-pay | Admitting: Internal Medicine

## 2013-11-10 VITALS — BP 131/84 | HR 60 | Temp 98.0°F

## 2013-11-10 DIAGNOSIS — E785 Hyperlipidemia, unspecified: Secondary | ICD-10-CM | POA: Diagnosis not present

## 2013-11-10 DIAGNOSIS — Z23 Encounter for immunization: Secondary | ICD-10-CM | POA: Diagnosis not present

## 2013-11-10 DIAGNOSIS — I10 Essential (primary) hypertension: Secondary | ICD-10-CM | POA: Diagnosis not present

## 2013-11-10 DIAGNOSIS — Z0000 Encounter for general adult medical examination without abnormal findings: Secondary | ICD-10-CM | POA: Diagnosis not present

## 2013-11-10 DIAGNOSIS — I2510 Atherosclerotic heart disease of native coronary artery without angina pectoris: Secondary | ICD-10-CM

## 2013-11-10 LAB — LIPID PANEL
Cholesterol: 106 mg/dL (ref 0–200)
HDL: 30.6 mg/dL — AB (ref >39.00)
LDL CALC: 67 mg/dL (ref 0–99)
TRIGLYCERIDES: 44 mg/dL (ref 0.0–149.0)
Total CHOL/HDL Ratio: 3
VLDL: 8.8 mg/dL (ref 0.0–40.0)

## 2013-11-10 MED ORDER — ZOSTER VACCINE LIVE 19400 UNT/0.65ML ~~LOC~~ SOLR: 0.6500 mL | Freq: Once | SUBCUTANEOUS | Status: DC

## 2013-11-10 NOTE — Patient Instructions (Signed)
Get your blood work before you leave    Next visit is for routine check up regards your blood pressure in 6 months  No need to come back fasting Please make an appointment     Fall Prevention and [place] cause injuries and can affect all age groups. It is possible to use preventive measures to significantly decrease the likelihood of falls. There are many simple measures which can make your home safer and prevent falls. OUTDOORS  Repair cracks and edges of walkways and driveways.  Remove high doorway thresholds.  Trim shrubbery on the main path into your home.  Have good outside lighting.  Clear walkways of tools, rocks, debris, and clutter.  Check that handrails are not broken and are securely fastened. Both sides of steps should have handrails.  Have leaves, snow, and ice cleared regularly.  Use sand or salt on walkways during winter months.  In the garage, clean up grease or oil spills. BATHROOM  Install night lights.  Install grab bars by the toilet and in the tub and shower.  Use non-skid mats or decals in the tub or shower.  Place a plastic non-slip stool in the shower to sit on, if needed.  Keep floors dry and clean up all water on the floor immediately.  Remove soap buildup in the tub or shower on a regular basis.  Secure bath mats with non-slip, double-sided rug tape.  Remove throw rugs and tripping hazards from the floors. BEDROOMS  Install night lights.  Make sure a bedside light is easy to reach.  Do not use oversized bedding.  Keep a telephone by your bedside.  Have a firm chair with side arms to use for getting dressed.  Remove throw rugs and tripping hazards from the floor. KITCHEN  Keep handles on pots and pans turned toward the center of the stove. Use back burners when possible.  Clean up spills quickly and allow time for drying.  Avoid walking on wet floors.  Avoid hot utensils and knives.  Position shelves so they are  not too high or low.  Place commonly used objects within easy reach.  If necessary, use a sturdy step stool with a grab bar when reaching.  Keep electrical cables out of the way.  Do not use floor polish or wax that makes floors slippery. If you must use wax, use non-skid floor wax.  Remove throw rugs and tripping hazards from the floor. STAIRWAYS  Never leave objects on stairs.  Place handrails on both sides of stairways and use them. Fix any loose handrails. Make sure handrails on both sides of the stairways are as long as the stairs.  Check carpeting to make sure it is firmly attached along stairs. Make repairs to worn or loose carpet promptly.  Avoid placing throw rugs at the top or bottom of stairways, or properly secure the rug with carpet tape to prevent slippage. Get rid of throw rugs, if possible.  Have an electrician put in a light switch at the top and bottom of the stairs. OTHER FALL PREVENTION TIPS  Wear low-heel or rubber-soled shoes that are supportive and fit well. Wear closed toe shoes.  When using a stepladder, make sure it is fully opened and both spreaders are firmly locked. Do not climb a closed stepladder.  Add color or contrast paint or tape to grab bars and handrails in your home. Place contrasting color strips on first and last steps.  Learn and use mobility aids as needed.  Install an electrical emergency response system.  Turn on lights to avoid dark areas. Replace light bulbs that burn out immediately. Get light switches that glow.  Arrange furniture to create clear pathways. Keep furniture in the same place.  Firmly attach carpet with non-skid or double-sided tape.  Eliminate uneven floor surfaces.  Select a carpet pattern that does not visually hide the edge of steps.  Be aware of all pets. OTHER HOME SAFETY TIPS  Set the water temperature for 120 F (48.8 C).  Keep emergency numbers on or near the telephone.  Keep smoke detectors on  every level of the home and near sleeping areas. Document Released: 06/28/2002 Document Revised: 01/07/2012 Document Reviewed: 09/27/2011 [facility] Patient Information 2014 [place].

## 2013-11-10 NOTE — Progress Notes (Signed)
Pre visit review using our clinic review tool, if applicable. No additional management support is needed unless otherwise documented below in the visit note. 

## 2013-11-10 NOTE — Assessment & Plan Note (Addendum)
Asymptomatic, last visit with cardiology 06/2012, he was stable. Plan: Try to continue controlling cardiovascular risk factors

## 2013-11-10 NOTE — Assessment & Plan Note (Addendum)
Td due-- today PNM shot 2008 rec PREVNAR-- today  rec Zostavax, Rx printed  Cscope 2011, repeat 5 years due to h/o precancerous polyp, will discuss on RTC Last PSA 2011, wnl, sees urology, wife states they have check PSAs before but no recently. Discuss PSA next year although I don't believe he is a candidate for further screening

## 2013-11-10 NOTE — Assessment & Plan Note (Signed)
Due for labs

## 2013-11-10 NOTE — Progress Notes (Signed)
Subjective:    Patient ID: Jaime [name], male    DOB: May 03, 1941, 73 y.o.   MRN: [id number]  DOS:  11/10/2013 Type of  visit:  Here for Medicare AWV:  1. Risk factors based on Past M, S, F history: reviewed 2. Physical Activities:  Unable  3. Depression/mood: neg screening  4. Hearing:   No problemss noted or reported  5. ADL's:  Limited by MS 6. Fall Risk: high risk, no recent falls  7. home Safety: does feel safe at home  8. Height, weight, & visual acuity: see VS, vision ok, has not seen the eye doctor regularly, rec to make an appointment 9. Counseling: provided 10. Labs ordered based on risk factors: if needed  11. Referral Coordination: if needed 12. Care Plan, see assessment and plan  13. Cognitive Assessment: mild decrease memory per wife , close to normal memory exam at neuro recently   In addition, today we discussed the following: HTN--good medication compliance, ambulatory BPs usually 112/ 70, never more than 140. CAD, asymptomatic. Controlling cardiovascular risk factors Had abdominal pain 2 days ago, symptoms are better, patient felt it was probably constipation. Wife not that episodes of apnea at night, reports that in the past she was recommended a CPAP but the patient declined and still does. Will recommend observation for now. MS, neurology note reviewed, medications were changed   ROS No fever, chills  No  CP, SOB  Denies  nausea, vomiting diarrhea Denies  blood in the stools No GERD  Sx. (-) cough, sputum production (-) wheezing, chest congestion Self bladder cath, urine clear  Past Medical History  Diagnosis Date  . Hyperlipidemia   . Cardiomyopathy, ischemic     EF 45% per ECHO 2008  //   EF 25%, echo, August, 2013  . Pulmonary hypertension     moderate ECHO Jan 2008  . Systolic heart failure   . Multiple sclerosis [place] -- LAST VISIT 11-20-2010  NOTE W/ CHART  . Lung nodule     resolved 11-2006 CT Chest  .  Tobacco abuse     quit   . Increased prostate specific antigen (PSA) velocity   . Urinary retention     dx ~ 2-12, like from [place], now with a catheter, saw urology  . Carotid artery disease     Doppler, February, 2012, 0-39% bilateral,Mild smooth plaque  . Chronic indwelling Foley catheter   . Scoliosis associated with other condition   . Hemiparesis   . Fatigue SEVERE  . Impotence   . MI, acute, non ST segment elevation 2007    S/P PCI WITH X1 STENT (TAXUS DRUG-ELUTING) LEFT CIRCUMFLEX  . Diabetes mellitus     INSULIN-DEPENDANT  . Hypertension   . H/O pleural effusion 2008    POST THORACENTESIS  . History of colon polyps PRECANCEROUS  . Insomnia   . CAD (coronary artery disease) CARDIOLOGIST- DR KATZ-- VISIT 06-05-2011 IN [facility]    non-STEMI, 2007.Marland Kitchenoccluded circumflex.. Taxus stent placed...residual 80% LAD...50% RCA  . Urinary tract infection     hx of  . Atherosclerotic PVD with ulceration     left foot  . PAC (premature atrial contraction)     December, 2013    Past Surgical History  Procedure Laterality Date  . Hernia repair  1990    (R)  . Thoracentesis  2008    PLEURAL EFFUSION  . Cystoscopy  07/30/2011    Procedure: CYSTOSCOPY;  Surgeon: Hanley Ben, MD;  Location: [facility];  Service: Urology;  Laterality: N/A;  . Transurethral resection of prostate  07/30/2011    Procedure: TRANSURETHRAL RESECTION OF THE PROSTATE (TURP);  Surgeon: Hanley Ben, MD;  Location: [facility];  Service: Urology;  Laterality: N/A;  . Femoral-tibial bypass graft  03/11/2012    Procedure: BYPASS GRAFT FEMORAL-TIBIAL ARTERY;  Surgeon: Conrad Idaho, MD;  Location: [place];  Service: Vascular;  Laterality: Left;  Left Femoral -Tibial trunk bypass, Endarterectomy of Tibial- Peroneal trunk with vein angioplasty.  . Intraoperative arteriogram  03/11/2012    Procedure: INTRA OPERATIVE ARTERIOGRAM;  Surgeon: Conrad Ramseur, MD;  Location: [place];  Service: Vascular;   Laterality: Left;  . Femoral-popliteal bypass graft  03/11/2012    Procedure: BYPASS GRAFT FEMORAL-POPLITEAL ARTERY;  Surgeon: Conrad [place]oc[place], MD;  Location: [place];  Service: Vascular;  Laterality: Left;  embolectomy left lower leg  . Amputation  03/17/2012    Procedure: AMPUTATION ABOVE KNEE;  Surgeon: Conrad [place], MD;  Location: [place];  Service: Vascular;  Laterality: Left;  . Coronary angioplasty with stent placement  05-01-2006    OCCLUDED CIRCUMFLEX -- TAXUS STENT PLACMENT  AND RESIDUAL 80% LAD,  50% RCA  . Amputation  06/03/2012    Procedure: AMPUTATION ABOVE KNEE;  Surgeon: Conrad Oglethorpe, MD;  Location: [place];  Service: Vascular;  Laterality: Right;    History   Social History  . Marital Status: Married    Spouse Name: N/A    Number of Children: 2  . Years of Education: N/A   Occupational History  . disable    Social History Main Topics  . Smoking status: Former Smoker -- 108 years  . Smokeless tobacco: Former Systems developer    Quit date: 08/05/2009     Comment: pt states that he is using E-cigs  . Alcohol Use: No  . Drug Use: No  . Sexual Activity: Not on file     Comment: electronic cigarettes   Other Topics Concern  . Not on file   Social History Narrative   Lives w/ wife     Family History  Problem Relation Age of Onset  . Hypertension    . Heart attack Mother 77  . Heart disease Mother   . Stroke Mother   . Hyperlipidemia Mother   . Hypertension Mother   . Colon cancer Neg Hx   . Prostate cancer Neg Hx   . COPD Father   . Peripheral vascular disease Father   . Diabetes Brother   . Heart disease Brother   . Hypertension Brother   . Heart attack Brother   . Diabetes Daughter        Medication List       This list is accurate as of: 11/10/13  6:26 PM.  Always use your most recent med list.               aspirin EC 325 MG tablet  Take 325 mg by mouth daily.     atorvastatin 40 MG tablet  Commonly known as:  LIPITOR  Take 1 tablet (40 mg total) by  mouth at bedtime.     baclofen 10 MG tablet  Commonly known as:  LIORESAL  Take 5 mg by mouth 2 (two) times daily.     benazepril 10 MG tablet  Commonly known as:  LOTENSIN  Take 10 mg by mouth daily.     diclofenac sodium 1 %  Gel  Commonly known as:  VOLTAREN  Apply 1 application topically 4 (four) times daily as needed. For pain     gabapentin 300 MG capsule  Commonly known as:  NEURONTIN  Take 1 capsule (300 mg total) by mouth 3 (three) times daily.     GLUCAGEN 1 MG Solr injection  Generic drug:  glucagon  Inject 1 mg into the vein once as needed for low blood sugar.     insulin aspart protamine- aspart (70-30) 100 UNIT/ML injection  Commonly known as:  NOVOLOG MIX 70/30  Inject 15-35 Units into the skin 2 (two) times daily with a meal. Take 35 units in the morning with breakfast and take 15 units at supper     metoprolol succinate 25 MG 24 hr tablet  Commonly known as:  TOPROL-XL  Take 1.5 tablets (37.5 mg total) by mouth daily. Take 1.5 tablets (37.5 mg total) daily     pantoprazole 40 MG tablet  Commonly known as:  PROTONIX  Take 40 mg by mouth daily at 12 noon.     zoster vaccine live (PF) 19400 UNT/0.65ML injection  Commonly known as:  ZOSTAVAX  Inject 19,400 Units into the skin once.           Objective:   Physical Exam BP 131/84  Pulse 60  Temp(Src) 98 F (36.7 C)  SpO2 97% General -- alert, well-developed, NAD.  Neck --no thyromegaly  HEENT-- Not pale.   Lungs -- normal respiratory effort, no intercostal retractions, no accessory muscle use, and normal breath sounds.  Heart-- normal rate, regular rhythm, no murmur.  Abdomen-- Not distended, good bowel sounds,soft, non-tender. No rebound or rigidity. No mass,organomegaly.  Neurologic--  alert & oriented X3. Speech normal  Psych-- Cognition and judgment appear intact. Cooperative with normal attention span and concentration. No anxious or depressed appearing.  In good spirits       Assessment &  Plan:

## 2013-11-10 NOTE — Assessment & Plan Note (Signed)
Well-controlled, last BMP okay

## 2013-11-15 DIAGNOSIS — E7800 Pure hypercholesterolemia, unspecified: Secondary | ICD-10-CM | POA: Diagnosis not present

## 2013-11-15 DIAGNOSIS — I10 Essential (primary) hypertension: Secondary | ICD-10-CM | POA: Diagnosis not present

## 2013-11-15 DIAGNOSIS — IMO0001 Reserved for inherently not codable concepts without codable children: Secondary | ICD-10-CM | POA: Diagnosis not present

## 2013-11-15 NOTE — Telephone Encounter (Signed)
I'm not sure what paper work this is regarding.

## 2013-11-15 NOTE — Telephone Encounter (Signed)
Called no answer

## 2013-11-17 DIAGNOSIS — N390 Urinary tract infection, site not specified: Secondary | ICD-10-CM | POA: Diagnosis not present

## 2013-12-09 ENCOUNTER — Encounter: Payer: Medicare Other | Attending: Physical Medicine & Rehabilitation

## 2013-12-09 ENCOUNTER — Ambulatory Visit (HOSPITAL_BASED_OUTPATIENT_CLINIC_OR_DEPARTMENT_OTHER): Payer: Medicare Other | Admitting: Physical Medicine & Rehabilitation

## 2013-12-09 ENCOUNTER — Encounter: Payer: Self-pay | Admitting: Physical Medicine & Rehabilitation

## 2013-12-09 VITALS — BP 144/80 | HR 77 | Resp 14

## 2013-12-09 DIAGNOSIS — M7541 Impingement syndrome of right shoulder: Secondary | ICD-10-CM

## 2013-12-09 DIAGNOSIS — M25819 Other specified joint disorders, unspecified shoulder: Secondary | ICD-10-CM | POA: Diagnosis not present

## 2013-12-09 DIAGNOSIS — M75100 Unspecified rotator cuff tear or rupture of unspecified shoulder, not specified as traumatic: Secondary | ICD-10-CM

## 2013-12-09 DIAGNOSIS — IMO0002 Reserved for concepts with insufficient information to code with codable children: Secondary | ICD-10-CM | POA: Diagnosis not present

## 2013-12-09 DIAGNOSIS — M25519 Pain in unspecified shoulder: Secondary | ICD-10-CM | POA: Insufficient documentation

## 2013-12-09 DIAGNOSIS — M7580 Other shoulder lesions, unspecified shoulder: Principal | ICD-10-CM

## 2013-12-09 NOTE — Patient Instructions (Signed)
May call prior to next appointment for another shoulder injection if needed

## 2013-12-09 NOTE — Progress Notes (Signed)
Shoulder injection Right   Indication:Right Shoulder pain secondary to subacromial impingement syndrome not relieved by medication management and other conservative care. Pain With abduction of the right shoulder at 90  Informed consent was obtained after describing risks and benefits of the procedure with the patient, this includes bleeding, bruising, infection and medication side effects. The patient wishes to proceed and has given written consent. Patient was placed in a seated position. The Right shoulder was marked and prepped with betadine in the subacromial area. A 25-gauge 1-1/2 inch needle was inserted into the subacromial area. After negative draw back for blood, a solution containing 1 mL of 6 mg per ML celestone and 3 mL of 1% lidocaine was injected. A band aid was applied. The patient tolerated the procedure well. Post procedure instructions were given.

## 2013-12-13 ENCOUNTER — Other Ambulatory Visit: Payer: Self-pay | Admitting: Internal Medicine

## 2013-12-16 ENCOUNTER — Encounter: Payer: Self-pay | Admitting: Internal Medicine

## 2013-12-16 ENCOUNTER — Ambulatory Visit (HOSPITAL_BASED_OUTPATIENT_CLINIC_OR_DEPARTMENT_OTHER)
Admission: RE | Admit: 2013-12-16 | Discharge: 2013-12-16 | Disposition: A | Discharge status: Home / Self Care | Payer: Medicare Other | Source: Ambulatory Visit | Attending: Internal Medicine | Admitting: Internal Medicine

## 2013-12-16 ENCOUNTER — Ambulatory Visit (INDEPENDENT_AMBULATORY_CARE_PROVIDER_SITE_OTHER): Payer: Medicare Other | Admitting: Internal Medicine

## 2013-12-16 ENCOUNTER — Other Ambulatory Visit: Payer: Self-pay | Admitting: Internal Medicine

## 2013-12-16 VITALS — BP 149/91 | HR 68 | Temp 97.9°F

## 2013-12-16 DIAGNOSIS — J209 Acute bronchitis, unspecified: Secondary | ICD-10-CM

## 2013-12-16 DIAGNOSIS — G35 Multiple sclerosis: Secondary | ICD-10-CM | POA: Diagnosis not present

## 2013-12-16 DIAGNOSIS — R0989 Other specified symptoms and signs involving the circulatory and respiratory systems: Secondary | ICD-10-CM | POA: Diagnosis not present

## 2013-12-16 DIAGNOSIS — I2510 Atherosclerotic heart disease of native coronary artery without angina pectoris: Secondary | ICD-10-CM

## 2013-12-16 DIAGNOSIS — R222 Localized swelling, mass and lump, trunk: Secondary | ICD-10-CM | POA: Diagnosis not present

## 2013-12-16 DIAGNOSIS — I739 Peripheral vascular disease, unspecified: Secondary | ICD-10-CM | POA: Diagnosis not present

## 2013-12-16 NOTE — Assessment & Plan Note (Signed)
Status post bilateral AKA, needs a personal mobility device. See history of present illness

## 2013-12-16 NOTE — Assessment & Plan Note (Signed)
Long history of MS, needs a PMD. See history of present illness

## 2013-12-16 NOTE — Progress Notes (Signed)
Pre visit review using our clinic review tool, if applicable. No additional management support is needed unless otherwise documented below in the visit note. 

## 2013-12-16 NOTE — Patient Instructions (Signed)
Get the XR at [facility] and [address] (10 minutes form here); they are open 24/7 [place], [place] [address] [phone]   mucinex DM twice a day Therapy to help mobilize mucus Call if symptoms increase, fever, chills  Next visit 5 months

## 2013-12-16 NOTE — Progress Notes (Signed)
Subjective:    Patient ID: Jaime [name], male    DOB: Apr 18, 1941, 73 y.o.   MRN: [id number]  DOS:  12/16/2013 Type of  Visit: here for a mobility assesment History: pt trying to get a personal mobility device (PMD) and needs a mobility assesment, we followed the format provided by the company: Vital signs is stable He has a history of pressure ulcers Cardiopulmonary examination is stable Muscle skeletal examination, status post bilateral below-knee amputations Neurological exam showed a relatively normal upper extremities strength, he has significant upper body posture problems due to MS. What has changed that you require a power mobility device? Patient has been using PMD for years. Medical conditions that limit your mobility? MS and bilateral below-knee amputation Pain scale and range of motion: Having moderate pain due to decreased mobility, range of motion of the upper extremities is good. Activities of daily living: The PMD will help patient reach his bathroom, kitchen, etc. He is not able to use a cane or a walker Manual  wheelchair: Patient unable to use Scooter: Patient unable to used due to bilateral AKA The patient does have a mental ability and upper extremity strength to operate PMD and is willing and motivated to [place] it. He does need a wider PMD with the ability to become flat   ROS Also complains of chest congestion for a few days. Denies fever, chills. No sinus pain or congestion No actual chest pain or difficulty breathing. He does have some cough in the morning with known sputum production.  Past Medical History  Diagnosis Date  . Hyperlipidemia   . Cardiomyopathy, ischemic     EF 45% per ECHO 2008  //   EF 25%, echo, August, 2013  . Pulmonary hypertension     moderate ECHO Jan 2008  . Systolic heart failure   . Multiple sclerosis [place] -- LAST VISIT 11-20-2010  NOTE W/ CHART  . Lung nodule     resolved 11-2006 CT Chest  .  Tobacco abuse     quit   . Increased prostate specific antigen (PSA) velocity   . Urinary retention     dx ~ 2-12, like from [place], now with a catheter, saw urology  . Carotid artery disease     Doppler, February, 2012, 0-39% bilateral,Mild smooth plaque  . Chronic indwelling Foley catheter   . Scoliosis associated with other condition   . Hemiparesis   . Fatigue SEVERE  . Impotence   . MI, acute, non ST segment elevation 2007    S/P PCI WITH X1 STENT (TAXUS DRUG-ELUTING) LEFT CIRCUMFLEX  . Diabetes mellitus     INSULIN-DEPENDANT  . Hypertension   . H/O pleural effusion 2008    POST THORACENTESIS  . History of colon polyps PRECANCEROUS  . Insomnia   . CAD (coronary artery disease) CARDIOLOGIST- DR KATZ-- VISIT 06-05-2011 IN [facility]    non-STEMI, 2007.Marland Kitchenoccluded circumflex.. Taxus stent placed...residual 80% LAD...50% RCA  . Urinary tract infection     hx of  . Atherosclerotic PVD with ulceration     left foot  . PAC (premature atrial contraction)     December, 2013    Past Surgical History  Procedure Laterality Date  . Hernia repair  1990    (R)  . Thoracentesis  2008    PLEURAL EFFUSION  . Cystoscopy  07/30/2011    Procedure: CYSTOSCOPY;  Surgeon: Hanley Ben, MD;  Location: [facility];  Service:  Urology;  Laterality: N/A;  . Transurethral resection of prostate  07/30/2011    Procedure: TRANSURETHRAL RESECTION OF THE PROSTATE (TURP);  Surgeon: Hanley Ben, MD;  Location: [facility];  Service: Urology;  Laterality: N/A;  . Femoral-tibial bypass graft  03/11/2012    Procedure: BYPASS GRAFT FEMORAL-TIBIAL ARTERY;  Surgeon: Conrad Wallace, MD;  Location: [place];  Service: Vascular;  Laterality: Left;  Left Femoral -Tibial trunk bypass, Endarterectomy of Tibial- Peroneal trunk with vein angioplasty.  . Intraoperative arteriogram  03/11/2012    Procedure: INTRA OPERATIVE ARTERIOGRAM;  Surgeon: Conrad [place], MD;  Location: [place];  Service: Vascular;   Laterality: Left;  . Femoral-popliteal bypass graft  03/11/2012    Procedure: BYPASS GRAFT FEMORAL-POPLITEAL ARTERY;  Surgeon: Conrad Winfield, MD;  Location: [place];  Service: Vascular;  Laterality: Left;  embolectomy left lower leg  . Amputation  03/17/2012    Procedure: AMPUTATION ABOVE KNEE;  Surgeon: Conrad Fairlea, MD;  Location: [place];  Service: Vascular;  Laterality: Left;  . Coronary angioplasty with stent placement  05-01-2006    OCCLUDED CIRCUMFLEX -- TAXUS STENT PLACMENT  AND RESIDUAL 80% LAD,  50% RCA  . Amputation  06/03/2012    Procedure: AMPUTATION ABOVE KNEE;  Surgeon: Conrad Haugen, MD;  Location: [place];  Service: Vascular;  Laterality: Right;    History   Social History  . Marital Status: Married    Spouse Name: N/A    Number of Children: 2  . Years of Education: N/A   Occupational History  . disable    Social History Main Topics  . Smoking status: Former Smoker -- 38 years  . Smokeless tobacco: Former Systems developer    Quit date: 08/05/2009     Comment: pt states that he is using E-cigs  . Alcohol Use: No  . Drug Use: No  . Sexual Activity: Not on file     Comment: electronic cigarettes   Other Topics Concern  . Not on file   Social History Narrative   Lives w/ wife        Medication List       This list is accurate as of: 12/16/13  9:31 AM.  Always use your most recent med list.               aspirin EC 325 MG tablet  Take 325 mg by mouth daily.     atorvastatin 40 MG tablet  Commonly known as:  LIPITOR  TAKE 1 TABLET (40 MG TOTAL) BY MOUTH AT BEDTIME.     baclofen 10 MG tablet  Commonly known as:  LIORESAL  Take 5 mg by mouth 2 (two) times daily.     benazepril 10 MG tablet  Commonly known as:  LOTENSIN  TAKE 1 TABLET BY MOUTH EVERY DAY     diclofenac sodium 1 % Gel  Commonly known as:  VOLTAREN  Apply 1 application topically 4 (four) times daily as needed. For pain     gabapentin 300 MG capsule  Commonly known as:  NEURONTIN  Take 1 capsule  (300 mg total) by mouth 3 (three) times daily.     GLUCAGEN 1 MG Solr injection  Generic drug:  glucagon  Inject 1 mg into the vein once as needed for low blood sugar.     insulin aspart protamine- aspart (70-30) 100 UNIT/ML injection  Commonly known as:  NOVOLOG MIX 70/30  Inject 15-35 Units into the skin 2 (two) times daily  with a meal. Take 35 units in the morning with breakfast and take 15 units at supper     metoprolol succinate 25 MG 24 hr tablet  Commonly known as:  TOPROL-XL  TAKE 1.5 TABLETS (37.5 MG TOTAL) BY MOUTH DAILY.     pantoprazole 40 MG tablet  Commonly known as:  PROTONIX  Take 40 mg by mouth daily at 12 noon.           Objective:   Physical Exam BP 149/91  Pulse 68  Temp(Src) 97.9 F (36.6 C)  SpO2 97% General -- alert,  NAD.   Lungs -- normal respiratory effort, no intercostal retractions, no accessory muscle use, and slt decreased BS at R base? Heart-- normal rate, regular rhythm, no murmur.  Psych-- Cognition and judgment appear intact. Cooperative with normal attention span and concentration. No anxious or depressed appearing.     Assessment & Plan:   Bronchitis? He does have decreased breath sounds at the right base, that could be positional giving spine deformities. Will get an x-ray one before. Otherwise recommend Mucinex and therapy to help mobilize secretions, to call if fever or chills   Today , I spent more than 28  min with the patient, >50% of the time counseling, reviewing the chart and doing a mobility assesment

## 2013-12-20 ENCOUNTER — Telehealth: Payer: Self-pay

## 2013-12-20 MED ORDER — ACETAMINOPHEN-CODEINE #4 300-60 MG PO TABS: 1.0000 | ORAL_TABLET | Freq: Three times a day (TID) | ORAL | Status: DC

## 2013-12-20 NOTE — Progress Notes (Signed)
Spoke with patients wife and made aware that CXR was normal. She states pts sx have improved

## 2013-12-20 NOTE — Telephone Encounter (Signed)
Patients wife called for patient.  He is complaining of increased pain that comes and goes.  The gabapentin is not helping.  Patient would like hydrocodone for pain.  Please advise.

## 2013-12-20 NOTE — Telephone Encounter (Signed)
Should be on Tylenol #4 1 po TID #90 with 1 RF I think he ran out and is not taking any narcotic analgesics Please make f/u with me in 6 wks

## 2013-12-20 NOTE — Telephone Encounter (Signed)
[facility]4 [facility]  Patient wife aware.  She said this did not help him in the past.  Advised her to try for a few days then call back if it does not get better.  Appointment made.

## 2013-12-27 ENCOUNTER — Telehealth: Payer: Self-pay

## 2013-12-27 NOTE — Telephone Encounter (Signed)
Neoma Laming from [employer] called and left a message on extension #[phone] today at 1107 requesting that the office note from last office visit on 12/16/13 with Dr. Larose Kells and a written order regarding wheelchair be faxed to [phone].  For questions she ask that we call [phone].  Reference ID # [id number].    Office note from 12/16/13 printed and additional forms and note forwarded to Dr. Ethel Rana nurse Julienne Kass, RN.

## 2013-12-27 NOTE — Telephone Encounter (Signed)
Paperwork completed and placed in Dr.[name]'s folder for signature.

## 2013-12-28 NOTE — Telephone Encounter (Signed)
Paperwork signed by provider and faxed to [facility]

## 2013-12-29 ENCOUNTER — Encounter: Payer: Self-pay | Admitting: *Deleted

## 2013-12-29 NOTE — Telephone Encounter (Signed)
Received fax from [facility] requesting additional information concerning the patients physical and mental condition. Letter prepared and faxed back to [facility] supporting the medical necessity for this patient to get a new [place].

## 2014-01-05 NOTE — Telephone Encounter (Signed)
Information printed from [place] notes regarding patients pressure ulcers and faxed to [facility] at # [phone]

## 2014-01-05 NOTE — Telephone Encounter (Signed)
Carla from [facility] called to see were patient's ulcers were located at. Contact number: [phone] and Ref number: [id number]

## 2014-01-11 NOTE — Telephone Encounter (Signed)
Final detail of product description with breakdown of cost signed by Dr. Larose Kells and faxed to [facility] at [phone]

## 2014-01-24 ENCOUNTER — Telehealth: Payer: Self-pay | Admitting: *Deleted

## 2014-01-24 NOTE — Telephone Encounter (Signed)
Spoke with patients wife who advised that [facility] has been contacted to take measurements to assure that Jaime [name] gets the most appropriate W/C. Patient has neuro appt and [facility] is to meet him there to take measurements and assist with ordering a power wheelchair that reclines. Andria Rhein with [facility] is helping the patient to facilitate this, contact number [phone]

## 2014-01-28 ENCOUNTER — Ambulatory Visit: Payer: Medicare Other | Admitting: Nurse Practitioner

## 2014-02-01 ENCOUNTER — Ambulatory Visit: Payer: Medicare Other | Admitting: Physical Medicine & Rehabilitation

## 2014-02-01 DIAGNOSIS — E119 Type 2 diabetes mellitus without complications: Secondary | ICD-10-CM | POA: Diagnosis not present

## 2014-02-01 DIAGNOSIS — G35 Multiple sclerosis: Secondary | ICD-10-CM | POA: Diagnosis not present

## 2014-02-01 DIAGNOSIS — I509 Heart failure, unspecified: Secondary | ICD-10-CM | POA: Diagnosis not present

## 2014-02-01 DIAGNOSIS — M4180 Other forms of scoliosis, site unspecified: Secondary | ICD-10-CM | POA: Diagnosis not present

## 2014-02-01 DIAGNOSIS — S88911A Complete traumatic amputation of right lower leg, level unspecified, initial encounter: Secondary | ICD-10-CM | POA: Diagnosis not present

## 2014-02-02 ENCOUNTER — Telehealth: Payer: Self-pay | Admitting: *Deleted

## 2014-02-02 NOTE — Telephone Encounter (Signed)
Received Orders paperwork via fax from [facility].  Placed in folder for Dr. Larose Kells to review and sign.//AB/CMA

## 2014-02-03 ENCOUNTER — Telehealth: Payer: Self-pay | Admitting: Cardiology

## 2014-02-03 ENCOUNTER — Telehealth: Payer: Self-pay | Admitting: *Deleted

## 2014-02-03 NOTE — Telephone Encounter (Signed)
Received signed Orders form from Dr. Larose Kells.  Form faxed to [facility] at [phone]).  Confirmation received.//AB/CMA

## 2014-02-03 NOTE — Telephone Encounter (Signed)
New message           Pt would like to know if he still needs a carotid ultrasound

## 2014-02-03 NOTE — Telephone Encounter (Signed)
Prior authorization denied by MED[employer]ARE [employer] for [phone] [phone] power wheelchair 01/20/14 . Pts wife notified . Pts wife states that she submitted another request through [facility].

## 2014-02-03 NOTE — Telephone Encounter (Signed)
LMTCB

## 2014-02-03 NOTE — Telephone Encounter (Signed)
**Note De-identified  Obfuscation** LMTCB

## 2014-02-04 ENCOUNTER — Ambulatory Visit: Payer: Medicare Other | Admitting: Neurology

## 2014-02-04 NOTE — Telephone Encounter (Signed)
**Note De-identified  Obfuscation** LMTCB

## 2014-02-04 NOTE — Telephone Encounter (Signed)
The pts wife has scheduled an OV with [name], PA-C on 02/21/14 and states that she will discuss which test the pt needs at that time.

## 2014-02-07 ENCOUNTER — Encounter: Payer: Self-pay | Admitting: Neurology

## 2014-02-07 ENCOUNTER — Telehealth: Payer: Self-pay | Admitting: *Deleted

## 2014-02-07 ENCOUNTER — Telehealth: Payer: Self-pay | Admitting: Neurology

## 2014-02-07 NOTE — Telephone Encounter (Signed)
Pt wife called, staing pt keeps breaking out on back, looks like chicken pox.  Sent to [facility], I spoke with Angie, RN and transferred call.  bw

## 2014-02-07 NOTE — Telephone Encounter (Signed)
Pt no showed 02/04/14 appt w/ Dr. Metta Clines. No show letter mailed to pt / Sherri S.

## 2014-02-11 ENCOUNTER — Encounter: Payer: Self-pay | Admitting: Neurology

## 2014-02-11 ENCOUNTER — Emergency Department (INDEPENDENT_AMBULATORY_CARE_PROVIDER_SITE_OTHER)
Admission: EM | Admit: 2014-02-11 | Discharge: 2014-02-11 | Disposition: A | Discharge status: Home / Self Care | Payer: Medicare Other | Source: Home / Self Care | Attending: Family Medicine | Admitting: Family Medicine

## 2014-02-11 ENCOUNTER — Ambulatory Visit (INDEPENDENT_AMBULATORY_CARE_PROVIDER_SITE_OTHER): Payer: Medicare Other | Admitting: Neurology

## 2014-02-11 ENCOUNTER — Encounter (HOSPITAL_COMMUNITY): Payer: Self-pay | Admitting: Emergency Medicine

## 2014-02-11 VITALS — BP 160/110 | HR 86 | Resp 18

## 2014-02-11 DIAGNOSIS — G35 Multiple sclerosis: Secondary | ICD-10-CM | POA: Diagnosis not present

## 2014-02-11 DIAGNOSIS — N390 Urinary tract infection, site not specified: Secondary | ICD-10-CM | POA: Diagnosis not present

## 2014-02-11 DIAGNOSIS — L259 Unspecified contact dermatitis, unspecified cause: Secondary | ICD-10-CM

## 2014-02-11 DIAGNOSIS — L738 Other specified follicular disorders: Secondary | ICD-10-CM | POA: Diagnosis not present

## 2014-02-11 DIAGNOSIS — I2510 Atherosclerotic heart disease of native coronary artery without angina pectoris: Secondary | ICD-10-CM | POA: Diagnosis not present

## 2014-02-11 DIAGNOSIS — L678 Other hair color and hair shaft abnormalities: Secondary | ICD-10-CM

## 2014-02-11 DIAGNOSIS — G3184 Mild cognitive impairment, so stated: Secondary | ICD-10-CM

## 2014-02-11 DIAGNOSIS — L309 Dermatitis, unspecified: Secondary | ICD-10-CM

## 2014-02-11 DIAGNOSIS — G547 Phantom limb syndrome without pain: Secondary | ICD-10-CM

## 2014-02-11 DIAGNOSIS — L739 Follicular disorder, unspecified: Secondary | ICD-10-CM

## 2014-02-11 DIAGNOSIS — G8220 Paraplegia, unspecified: Secondary | ICD-10-CM

## 2014-02-11 DIAGNOSIS — G546 Phantom limb syndrome with pain: Secondary | ICD-10-CM

## 2014-02-11 LAB — POCT URINALYSIS DIP (DEVICE)
Bilirubin Urine: NEGATIVE
Glucose, UA: NEGATIVE mg/dL
Nitrite: POSITIVE — AB
PH: 5.5 (ref 5.0–8.0)
PROTEIN: 30 mg/dL — AB
Urobilinogen, UA: 0.2 mg/dL (ref 0.0–1.0)

## 2014-02-11 MED ORDER — GABAPENTIN 300 MG PO CAPS: 600.0000 mg | ORAL_CAPSULE | Freq: Three times a day (TID) | ORAL | Status: DC

## 2014-02-11 MED ORDER — MUPIROCIN 2 % EX OINT: 1.0000 "application " | TOPICAL_OINTMENT | Freq: Two times a day (BID) | CUTANEOUS | Status: DC

## 2014-02-11 MED ORDER — SULFAMETHOXAZOLE-TRIMETHOPRIM 800-160 MG PO TABS: 2.0000 | ORAL_TABLET | Freq: Two times a day (BID) | ORAL | Status: DC

## 2014-02-11 NOTE — Progress Notes (Signed)
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    UPDATE: For pain, he was restarted on gabapentin at 300mg  three times daily, but he hasn't been taking it regularly because it only lasts a short time.  His scoliosis has gotten worse and he should be getting a new chair that reclines back and flattens.  He naps a lot during the day and is up at night.  He notes a pruritic rash on his back, pustules that have since dried.  HISTORY: He was diagnosed with MS in 1993.  Initially, he developed right arm and leg weakness along with balance problems.  Symptoms initially improved but had progressed over the years.  He was on multiple immunomodulating agents such as Betaseron, Copaxone, Cytoxin and monthly Solumedrol.  He currently is not on an agent and hasn't had a flare up in 4 years.  He has bilateral ATK ambutations performed in 2013 as a consequence of his peripheral vascular and diabetes.  He requires assistance with bathing and dressing, but he is able to feed himself.  He uses a power wheelchair.  He had been on amantadine for many years.  MRI Brain w/wo from 11/29/04 demonstrated "extensive white matter disease in the supratentorial compartment, which is consistent with advanced multiple sclerosis.  There is confluence of the white matter lesions with atrophy and hydrocephalus, thinned corpus callosum, and black holes, however somewhat unusual for MS of this degree, no infrantentorial lesions are seen and no active lesions are identified by means of contrast." CT of head was performed on 03/23/12 for encephalopathy, which revealed atrophy and nonspecific small vessel ischemic changes.  He has [place].  More recently, he had been experiencing vivid  nightmares.  It was believed to be due to low blood sugars or codeine.  On 11/01/13, he presented to the ED with severe right phantom limb pain, described as muscle spasms like a Charley horse, that was intermittent and lasting 10 minutes at a time.  Labs were unremarkable, except for WBCs in the urine, however he was recently treated for UTI.  He was given Percocet and Valium, which helped.  Tylenol 3 and 4 do not help.  He currently is taking Percocet which helps.  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  He has some poor short-term memory. Sometimes he calls his wife by his mother's name.  He cannot always remember the names of his great grandchildren.  He has more difficulty figuring out how to maneuver the motorized wheelchair.  Medications include:  Tylenol #4, baclofen, gabapentin (occasionally).  Amantadine was discontinued due to nightmares.  PAST MEDICAL HISTORY: Past Medical History  Diagnosis Date  . Hyperlipidemia   . Cardiomyopathy, ischemic     EF 45% per ECHO 2008  //   EF 25%, echo, August, 2013  . Pulmonary hypertension     moderate ECHO Jan 2008  . Systolic heart failure   . Multiple sclerosis [place] -- LAST VISIT 11-20-2010  NOTE W/ CHART  . Lung nodule     resolved 11-2006 CT Chest  . Tobacco abuse     quit   . Increased prostate specific antigen (PSA) velocity   . Urinary retention     dx ~  2-12, like from [place], now with a catheter, saw urology  . Carotid artery disease     Doppler, February, 2012, 0-39% bilateral,Mild smooth plaque  . Chronic indwelling Foley catheter   . Scoliosis associated with other condition   . Hemiparesis   . Fatigue SEVERE  . Impotence   . MI, acute, non ST segment elevation 2007    S/P PCI WITH X1 STENT (TAXUS DRUG-ELUTING) LEFT CIRCUMFLEX  . Diabetes mellitus     INSULIN-DEPENDANT  . Hypertension   . H/O pleural effusion 2008    POST THORACENTESIS  . History of colon polyps PRECANCEROUS  .  Insomnia   . CAD (coronary artery disease) CARDIOLOGIST- DR KATZ-- VISIT 06-05-2011 IN [facility]    non-STEMI, 2007.Marland Kitchenoccluded circumflex.. Taxus stent placed...residual 80% LAD...50% RCA  . Urinary tract infection     hx of  . Atherosclerotic PVD with ulceration     left foot  . PAC (premature atrial contraction)     December, 2013    MEDICATIONS: Current Outpatient Prescriptions on File Prior to Visit  Medication Sig Dispense Refill  . acetaminophen-codeine (TYLENOL #4) 300-60 MG per tablet Take 1 tablet by mouth 3 (three) times daily.  90 tablet  1  . aspirin EC 325 MG tablet Take 325 mg by mouth daily.      Marland Kitchen atorvastatin (LIPITOR) 40 MG tablet TAKE 1 TABLET (40 MG TOTAL) BY MOUTH AT BEDTIME.  90 tablet  1  . baclofen (LIORESAL) 10 MG tablet Take 5 mg by mouth 2 (two) times daily.      . benazepril (LOTENSIN) 10 MG tablet TAKE 1 TABLET BY MOUTH EVERY DAY  90 tablet  1  . diclofenac sodium (VOLTAREN) 1 % GEL Apply 1 application topically 4 (four) times daily as needed. For pain       . glucagon (GLUCAGEN) 1 MG SOLR injection Inject 1 mg into the vein once as needed for low blood sugar.      . insulin aspart protamine- aspart (NOVOLOG MIX 70/30) (70-30) 100 UNIT/ML injection Inject 15-35 Units into the skin 2 (two) times daily with a meal. Take 35 units in the morning with breakfast and take 15 units at supper      . metoprolol succinate (TOPROL-XL) 25 MG 24 hr tablet TAKE 1.5 TABLETS (37.5 MG TOTAL) BY MOUTH DAILY.  135 tablet  1  . pantoprazole (PROTONIX) 40 MG tablet Take 40 mg by mouth daily at 12 noon.       No current facility-administered medications on file prior to visit.    ALLERGIES: Allergies  Allergen Reactions  . Bee Venom Anaphylaxis    FAMILY HISTORY: Family History  Problem Relation Age of Onset  . Hypertension    . Heart attack Mother 43  . Heart disease Mother   . Stroke Mother   . Hyperlipidemia Mother   . Hypertension Mother   . Colon cancer Neg Hx   .  Prostate cancer Neg Hx   . COPD Father   . Peripheral vascular disease Father   . Diabetes Brother   . Heart disease Brother   . Hypertension Brother   . Heart attack Brother   . Diabetes Daughter     SOCIAL HISTORY: History   Social History  . Marital Status: Married    Spouse Name: N/A    Number of Children: 2  . Years of Education: N/A   Occupational History  . disable    Social History Main Topics  . Smoking  status: Former Smoker -- 29 years  . Smokeless tobacco: Former Systems developer    Quit date: 08/05/2009     Comment: pt states that he is using E-cigs  . Alcohol Use: No  . Drug Use: No  . Sexual Activity: Not on file     Comment: electronic cigarettes   Other Topics Concern  . Not on file   Social History Narrative   Lives w/ wife    REVIEW OF SYSTEMS: Constitutional: No fevers, chills, or sweats, no generalized fatigue, change in appetite Eyes: No visual changes, double vision, eye pain Ear, nose and throat: No hearing loss, ear pain, nasal congestion, sore throat Cardiovascular: No chest pain, palpitations Respiratory:  No shortness of breath at rest or with exertion, wheezes GastrointestinaI: No nausea, vomiting, diarrhea, abdominal pain, fecal incontinence Genitourinary:  No dysuria, urinary retention or frequency Musculoskeletal:  No neck pain, back pain Integumentary: Pruritic dried pustules on his back Neurological: as above Psychiatric: Insomnia Endocrine: No palpitations, fatigue, diaphoresis, mood swings, change in appetite, change in weight, increased thirst Hematologic/Lymphatic:  No anemia, purpura, petechiae. Allergic/Immunologic: no itchy/runny eyes, nasal congestion, recent allergic reactions, rashes  PHYSICAL EXAM: Filed Vitals:   02/11/14 1528  BP: 160/110  Pulse: 86  Resp: 18   General: No acute distress Head:  Normocephalic/atraumatic Neck: supple, no paraspinal tenderness, full range of motion Heart:  Regular rate and rhythm Lungs:   Clear to auscultation bilaterally Back: No paraspinal tenderness. Dried  pustules on back Neurological Exam: alert and oriented to person, place, and time (except date), recent and remote memory intact, fund of knowledge intact, attention and concentration intact,  MMSE - Mini Mental State Exam 02/11/2014  Orientation to time 5  Orientation to Place 5  Registration 3  Attention/ Calculation 5  Recall 3  Language- name 2 objects 2  Language- repeat 1  Language- follow 3 step command 3  Language- read & follow direction 1  Write a sentence 0  Copy design 0  Total score 28   speech fluent and not dysarthric, language intact.  CN II-XII intact.  Bulk and tone normal.  Motor:  5-/5 in right deltoid and triceps.  Otherwise 5/5 in upper extremities.  Temperature and vibration intact in upper extremities.  Deep tendon reflexes 2+ and symmetric in upper extremities.  Finger to nose slow but no dysmetria.  Heel-to-shin and gait unable to be assessed due to ATK amputations.  IMPRESSION: Secondary progressive MS, stable Phantom limb pain. Mild cognitive impairment Eczema  PLAN: 1.  Increase gabapentin to 600mg  three times daily 2.  Baclofen 3.  Advised to call Dr. Ethel Rana office regarding eczema. 4.  Follow up in 4 months.  Metta Clines, DO  CC:  [name], MD

## 2014-02-11 NOTE — Discharge Instructions (Signed)
Thank you for coming in today. Take bactrim twice daily for skin and bladder.  Use the ointment.  Follow up with Dr Larose Kells.   Urinary Tract Infection Urinary tract infections (UTIs) can develop anywhere along your urinary tract. Your urinary tract is your body's drainage system for removing wastes and extra water. Your urinary tract includes two kidneys, two ureters, a bladder, and a urethra. Your kidneys are a pair of bean-shaped organs. Each kidney is about the size of your fist. They are located below your ribs, one on each side of your spine. CAUSES Infections are caused by microbes, which are microscopic organisms, including fungi, viruses, and bacteria. These organisms are so small that they can only be seen through a microscope. Bacteria are the microbes that most commonly cause UTIs. SYMPTOMS  Symptoms of UTIs may vary by age and gender of the patient and by the location of the infection. Symptoms in young women typically include a frequent and intense urge to urinate and a painful, burning feeling in the bladder or urethra during urination. Older women and men are more likely to be tired, shaky, and weak and have muscle aches and abdominal pain. A fever may mean the infection is in your kidneys. Other symptoms of a kidney infection include pain in your back or sides below the ribs, nausea, and vomiting. DIAGNOSIS To diagnose a UTI, your caregiver will ask you about your symptoms. Your caregiver also will ask to provide a urine sample. The urine sample will be tested for bacteria and white blood cells. White blood cells are made by your body to help fight infection. TREATMENT  Typically, UTIs can be treated with medication. Because most UTIs are caused by a bacterial infection, they usually can be treated with the use of antibiotics. The choice of antibiotic and length of treatment depend on your symptoms and the type of bacteria causing your infection. HOME CARE INSTRUCTIONS  If you were  prescribed antibiotics, take them exactly as your caregiver instructs you. Finish the medication even if you feel better after you have only taken some of the medication.  Drink enough water and fluids to keep your urine clear or pale yellow.  Avoid caffeine, tea, and carbonated beverages. They tend to irritate your bladder.  Empty your bladder often. Avoid holding urine for long periods of time.  Empty your bladder before and after sexual intercourse.  After a bowel movement, women should cleanse from front to back. Use each tissue only once. SEEK MEDICAL CARE IF:   You have back pain.  You develop a fever.  Your symptoms do not begin to resolve within 3 days. SEEK IMMEDIATE MEDICAL CARE IF:   You have severe back pain or lower abdominal pain.  You develop chills.  You have nausea or vomiting.  You have continued burning or discomfort with urination. MAKE SURE YOU:   Understand these instructions.  Will watch your condition.  Will get help right away if you are not doing well or get worse. Document Released: 04/17/2005 Document Revised: 01/07/2012 Document Reviewed: 08/16/2011 [facility] Patient Information 2015 [place], [place]. This information is not intended to replace advice given to you by your health care provider. Make sure you discuss any questions you have with your health care provider.   Folliculitis  Folliculitis is redness, soreness, and swelling (inflammation) of the hair follicles. This condition can occur anywhere on the body. People with weakened immune systems, diabetes, or obesity have a greater risk of getting folliculitis. CAUSES  Bacterial  infection. This is the most common cause.  Fungal infection.  Viral infection.  Contact with certain chemicals, especially oils and tars. Long-term folliculitis can result from bacteria that live in the nostrils. The bacteria may trigger multiple outbreaks of folliculitis over time. SYMPTOMS Folliculitis most  commonly occurs on the scalp, thighs, legs, back, buttocks, and areas where hair is shaved frequently. An early sign of folliculitis is a small, white or yellow, pus-filled, itchy lesion (pustule). These lesions appear on a red, inflamed follicle. They are usually less than 0.2 inches (5 mm) wide. When there is an infection of the follicle that goes deeper, it becomes a boil or furuncle. A group of closely packed boils creates a larger lesion (carbuncle). Carbuncles tend to occur in hairy, sweaty areas of the body. DIAGNOSIS  Your caregiver can usually tell what is wrong by doing a physical exam. A sample may be taken from one of the lesions and tested in a lab. This can help determine what is causing your folliculitis. TREATMENT  Treatment may include:  Applying warm compresses to the affected areas.  Taking antibiotic medicines orally or applying them to the skin.  Draining the lesions if they contain a large amount of pus or fluid.  Laser hair removal for cases of long-lasting folliculitis. This helps to prevent regrowth of the hair. HOME CARE INSTRUCTIONS  Apply warm compresses to the affected areas as directed by your caregiver.  If antibiotics are prescribed, take them as directed. Finish them even if you start to feel better.  You may take over-the-counter medicines to relieve itching.  Do not shave irritated skin.  Follow up with your caregiver as directed. SEEK IMMEDIATE MEDICAL CARE IF:   You have increasing redness, swelling, or pain in the affected area.  You have a fever. MAKE SURE YOU:  Understand these instructions.  Will watch your condition.  Will get help right away if you are not doing well or get worse. Document Released: 09/16/2001 Document Revised: 01/07/2012 Document Reviewed: 10/08/2011 [facility] Patient Information 2015 [place], [place]. This information is not intended to replace advice given to you by your health care provider. Make sure you discuss any  questions you have with your health care provider.

## 2014-02-11 NOTE — ED Notes (Signed)
C/o rash on back

## 2014-02-11 NOTE — ED Provider Notes (Signed)
Jaime [name] is a 73 y.o. male who presents to Urgent Care today for dysuria and rash. Patient has a one-week history of pain with urination. He denies any urinary frequency or urgency. He denies any fevers or chills nausea vomiting or diarrhea.   Patient additionally has a two-week history of a rash across his back. No itching or pain. Patient has tried baking soda baths which has not helped.   Past Medical History  Diagnosis Date  . Hyperlipidemia   . Cardiomyopathy, ischemic     EF 45% per ECHO 2008  //   EF 25%, echo, August, 2013  . Pulmonary hypertension     moderate ECHO Jan 2008  . Systolic heart failure   . Multiple sclerosis [place] -- LAST VISIT 11-20-2010  NOTE W/ CHART  . Lung nodule     resolved 11-2006 CT Chest  . Tobacco abuse     quit   . Increased prostate specific antigen (PSA) velocity   . Urinary retention     dx ~ 2-12, like from [place], now with a catheter, saw urology  . Carotid artery disease     Doppler, February, 2012, 0-39% bilateral,Mild smooth plaque  . Chronic indwelling Foley catheter   . Scoliosis associated with other condition   . Hemiparesis   . Fatigue SEVERE  . Impotence   . MI, acute, non ST segment elevation 2007    S/P PCI WITH X1 STENT (TAXUS DRUG-ELUTING) LEFT CIRCUMFLEX  . Diabetes mellitus     INSULIN-DEPENDANT  . Hypertension   . H/O pleural effusion 2008    POST THORACENTESIS  . History of colon polyps PRECANCEROUS  . Insomnia   . CAD (coronary artery disease) CARDIOLOGIST- DR KATZ-- VISIT 06-05-2011 IN [facility]    non-STEMI, 2007.Marland Kitchenoccluded circumflex.. Taxus stent placed...residual 80% LAD...50% RCA  . Urinary tract infection     hx of  . Atherosclerotic PVD with ulceration     left foot  . PAC (premature atrial contraction)     December, 2013   History  Substance Use Topics  . Smoking status: Former Smoker -- 24 years  . Smokeless tobacco: Former Systems developer    Quit date: 08/05/2009     Comment: pt  states that he is using E-cigs  . Alcohol Use: No   ROS as above Medications: No current facility-administered medications for this encounter.   Current Outpatient Prescriptions  Medication Sig Dispense Refill  . acetaminophen-codeine (TYLENOL #4) 300-60 MG per tablet Take 1 tablet by mouth 3 (three) times daily.  90 tablet  1  . aspirin EC 325 MG tablet Take 325 mg by mouth daily.      Marland Kitchen atorvastatin (LIPITOR) 40 MG tablet TAKE 1 TABLET (40 MG TOTAL) BY MOUTH AT BEDTIME.  90 tablet  1  . baclofen (LIORESAL) 10 MG tablet Take 5 mg by mouth 2 (two) times daily.      . benazepril (LOTENSIN) 10 MG tablet TAKE 1 TABLET BY MOUTH EVERY DAY  90 tablet  1  . diclofenac sodium (VOLTAREN) 1 % GEL Apply 1 application topically 4 (four) times daily as needed. For pain       . gabapentin (NEURONTIN) 300 MG capsule Take 2 capsules (600 mg total) by mouth 3 (three) times daily.  180 capsule  3  . glucagon (GLUCAGEN) 1 MG SOLR injection Inject 1 mg into the vein once as needed for low blood sugar.      Marland Kitchen  insulin aspart protamine- aspart (NOVOLOG MIX 70/30) (70-30) 100 UNIT/ML injection Inject 15-35 Units into the skin 2 (two) times daily with a meal. Take 35 units in the morning with breakfast and take 15 units at supper      . metoprolol succinate (TOPROL-XL) 25 MG 24 hr tablet TAKE 1.5 TABLETS (37.5 MG TOTAL) BY MOUTH DAILY.  135 tablet  1  . mupirocin ointment (BACTROBAN) 2 % Apply 1 application topically 2 (two) times daily.  30 g  2  . pantoprazole (PROTONIX) 40 MG tablet Take 40 mg by mouth daily at 12 noon.      . sulfamethoxazole-trimethoprim (SEPTRA DS) 800-160 MG per tablet Take 2 tablets by mouth 2 (two) times daily.  28 tablet  0    Exam:  BP 195/117  Pulse 76  Temp(Src) 99 F (37.2 C) (Oral)  Resp 18  SpO2 95% Gen: Well NAD HEENT: EOMI,  MMM Lungs: Normal work of breathing. CTABL Heart: RRR no MRG Abd: NABS, Soft. Nondistended, Nontender Exts: Bilateral AKA Skin: Scaly macules on  back with occasional erythema  Results for orders placed during the hospital encounter of 02/11/14 (from the past 24 hour(s))  POCT URINALYSIS DIP (DEVICE)     Status: Abnormal   Collection Time    02/11/14  5:55 PM      Result Value Ref Range   Glucose, UA NEGATIVE  NEGATIVE mg/dL   Bilirubin Urine NEGATIVE  NEGATIVE   Ketones, ur TRACE (*) NEGATIVE mg/dL   Specific Gravity, Urine >=1.030  1.005 - 1.030   Hgb urine dipstick TRACE (*) NEGATIVE   pH 5.5  5.0 - 8.0   Protein, ur 30 (*) NEGATIVE mg/dL   Urobilinogen, UA 0.2  0.0 - 1.0 mg/dL   Nitrite POSITIVE (*) NEGATIVE   Leukocytes, UA MODERATE (*) NEGATIVE   No results found.  Assessment and Plan: 73 y.o. male with  1) UTI: Probable. Culture pending. Treatment with Bactrim 2) skin: Impetigo versus folliculitis. Bactrim and Bactroban cream F/U with PCP  Discussed warning signs or symptoms. Please see discharge instructions. Patient expresses understanding.   This note was created using Systems analyst. Any transcription errors are unintended.    Gregor Hams, MD 02/11/14 2033

## 2014-02-11 NOTE — Patient Instructions (Signed)
1.   Increase gabapentin 300mg  to 2 pills (600mg ) three times daily 2.  Continue baclofen 3.  Call Dr. Ethel Rana office about possible eczema 4.  Await new power chair 5.  Try not to nap during the day 6.  Follow up in 4 months.

## 2014-02-14 LAB — URINE CULTURE: Colony Count: >=100000

## 2014-02-14 NOTE — ED Notes (Signed)
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  Pt. adequately treated with Septra DS.  Message to Dr. Georgina Snell. Roselyn Meier. 02/14/2014

## 2014-02-16 NOTE — ED Notes (Addendum)
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  Treated with Septra DS.  7/27  Reviewed. Message sent to Dr. Georgina Snell.  Lab shown to Dr. Juventino Slovak.  He said to tell pt. to f/u with his doctor after he finishes the antibiotic to make sure it is gone.  I called pt. and left a message to call. Jaime [name] 02/16/2014 Call 2.  I called and wife said he is taking a nap.  She said she is the POA.  I gave her the result and told her that he is adequately treated.  He needs to f/y with his PCP after the medication to recheck his urine. She said he has an appt. with Dr. Larose Kells on 8/5. She said the rash on his back is improved.  I reviewed the [facility] MRSA instructions with her.  She voiced understanding. Jaime [name] 02/17/2014

## 2014-02-21 ENCOUNTER — Encounter: Payer: Self-pay | Admitting: Physician Assistant

## 2014-02-21 ENCOUNTER — Telehealth: Payer: Self-pay | Admitting: Physician Assistant

## 2014-02-21 ENCOUNTER — Ambulatory Visit (INDEPENDENT_AMBULATORY_CARE_PROVIDER_SITE_OTHER): Payer: Medicare Other | Admitting: Physician Assistant

## 2014-02-21 VITALS — BP 143/87 | HR 56

## 2014-02-21 DIAGNOSIS — G35 Multiple sclerosis: Secondary | ICD-10-CM

## 2014-02-21 DIAGNOSIS — I2510 Atherosclerotic heart disease of native coronary artery without angina pectoris: Secondary | ICD-10-CM

## 2014-02-21 DIAGNOSIS — R0602 Shortness of breath: Secondary | ICD-10-CM | POA: Diagnosis not present

## 2014-02-21 DIAGNOSIS — I2589 Other forms of chronic ischemic heart disease: Secondary | ICD-10-CM

## 2014-02-21 DIAGNOSIS — I6529 Occlusion and stenosis of unspecified carotid artery: Secondary | ICD-10-CM | POA: Diagnosis not present

## 2014-02-21 DIAGNOSIS — I10 Essential (primary) hypertension: Secondary | ICD-10-CM

## 2014-02-21 DIAGNOSIS — E785 Hyperlipidemia, unspecified: Secondary | ICD-10-CM

## 2014-02-21 DIAGNOSIS — I739 Peripheral vascular disease, unspecified: Secondary | ICD-10-CM

## 2014-02-21 LAB — BASIC METABOLIC PANEL
BUN: 13 mg/dL (ref 6–23)
CALCIUM: 8.8 mg/dL (ref 8.4–10.5)
CO2: 23 meq/L (ref 19–32)
Chloride: 103 mEq/L (ref 96–112)
Creatinine, Ser: 0.7 mg/dL (ref 0.4–1.5)
GFR: 138.02 mL/min (ref >60.00)
GLUCOSE: 171 mg/dL — AB (ref 70–99)
Potassium: 4 mEq/L (ref 3.5–5.1)
Sodium: 133 mEq/L — ABNORMAL LOW (ref 135–145)

## 2014-02-21 LAB — BRAIN NATRIURETIC PEPTIDE: Pro B Natriuretic peptide (BNP): 84 pg/mL (ref 0.0–100.0)

## 2014-02-21 NOTE — Patient Instructions (Signed)
Your physician recommends that you continue on your current medications as directed. Please refer to the Current Medication list given to you today.   Your physician recommends that you return for lab work in: TODAY; BMET, BNP  Your physician has requested that you have a carotid duplex. This test is an ultrasound of the carotid arteries in your neck. It looks at blood flow through these arteries that supply the brain with blood. Allow one hour for this exam. There are no restrictions or special instructions.  Your physician recommends that you schedule a follow-up appointment in: 3 MONTHS WITH DR. KATZ

## 2014-02-21 NOTE — Progress Notes (Signed)
Cardiology Office Note    Date:  02/21/2014   ID:  Jaime [name], DOB [birth year], MRN [id number]  PCP:  [name], MD  Cardiologist:  Dr. Cleatis Polka      History of Present Illness: Jaime [name] is a 73 y.o. male with a hx of multiple sclerosis, CAD s/p NSTEMI tx with DES to CFX in 2007, PAD s/p bilateral AKA, ischemic cardiomyopathy, HTN, HL, DM.  EF was noted to drop in 2013 on testing prior to amputations.  Nuclear study demonstrated no significant ischemia.  Medical Rx was adjusted after his bilateral AKAs.  Last seen by Dr. Cleatis Polka in 06/2012.    Returns for f/u.  Here with his wife.  He is wheelchair bound. His wife notes that he has had episodes of dyspnea at times.  Denies chest pain.  Denies syncope.  He chronically sleeps on an incline. No PND.  No increased abdominal girth or thigh edema.    Studies:  - LHC 04/2006: mLAD 50%, D2 80% at the origin, pCFX occluded, pRCA 50%, EF 40%. PCI: Taxus DES to the pCFX.   - Echo 07/2006: Inferior and posterior severe HK, EF 45%  - Echo (8/13): inf HK, EF 25%, mild MR   - Carotid Dopplers 08/2010: Bilateral 0-39%  - Nuclear (8/13):  Lg inf-lat and small ant-septal scar, no ischemia, EF 29%.   Recent Labs/Images: 11/01/2013: ALT 24; Creatinine 0.65; Hemoglobin 14.5; Potassium 3.9  11/10/2013: HDL Cholesterol by NMR 30.60*; LDL (calc) 67    Wt Readings from Last 3 Encounters:  08/19/13 148 lb (67.132 kg)  08/19/13 148 lb (67.132 kg)  08/13/13 148 lb (67.132 kg)     Past Medical History  Diagnosis Date  . Hyperlipidemia   . Cardiomyopathy, ischemic     EF 45% per ECHO 2008  //   EF 25%, echo, August, 2013  . Pulmonary hypertension     moderate ECHO Jan 2008  . Systolic heart failure   . Multiple sclerosis [place] -- LAST VISIT 11-20-2010  NOTE W/ CHART  . Lung nodule     resolved 11-2006 CT Chest  . Tobacco abuse     quit   . Increased prostate specific antigen (PSA) velocity   .  Urinary retention     dx ~ 2-12, like from [place], now with a catheter, saw urology  . Carotid artery disease     Doppler, February, 2012, 0-39% bilateral,Mild smooth plaque  . Chronic indwelling Foley catheter   . Scoliosis associated with other condition   . Hemiparesis   . Fatigue SEVERE  . Impotence   . MI, acute, non ST segment elevation 2007    S/P PCI WITH X1 STENT (TAXUS DRUG-ELUTING) LEFT CIRCUMFLEX  . Diabetes mellitus     INSULIN-DEPENDANT  . Hypertension   . H/O pleural effusion 2008    POST THORACENTESIS  . History of colon polyps PRECANCEROUS  . Insomnia   . CAD (coronary artery disease) CARDIOLOGIST- DR KATZ-- VISIT 06-05-2011 IN [facility]    non-STEMI, 2007.Marland Kitchenoccluded circumflex.. Taxus stent placed...residual 80% LAD...50% RCA  . Urinary tract infection     hx of  . Atherosclerotic PVD with ulceration     left foot  . PAC (premature atrial contraction)     December, 2013    Current Outpatient Prescriptions  Medication Sig Dispense Refill  . acetaminophen-codeine (TYLENOL #4) 300-60 MG per tablet Take 1 tablet by mouth 3 (three)  times daily.  90 tablet  1  . aspirin EC 325 MG tablet Take 325 mg by mouth daily.      Marland Kitchen atorvastatin (LIPITOR) 40 MG tablet TAKE 1 TABLET (40 MG TOTAL) BY MOUTH AT BEDTIME.  90 tablet  1  . baclofen (LIORESAL) 10 MG tablet Take 5 mg by mouth 2 (two) times daily.      . benazepril (LOTENSIN) 10 MG tablet TAKE 1 TABLET BY MOUTH EVERY DAY  90 tablet  1  . diclofenac sodium (VOLTAREN) 1 % GEL Apply 1 application topically 4 (four) times daily as needed. For pain       . gabapentin (NEURONTIN) 300 MG capsule Take 2 capsules (600 mg total) by mouth 3 (three) times daily.  180 capsule  3  . glucagon (GLUCAGEN) 1 MG SOLR injection Inject 1 mg into the vein once as needed for low blood sugar.      . insulin aspart protamine- aspart (NOVOLOG MIX 70/30) (70-30) 100 UNIT/ML injection Inject 15-35 Units into the skin 2 (two) times daily with a meal. Take 35  units in the morning with breakfast and take 15 units at supper      . metoprolol succinate (TOPROL-XL) 25 MG 24 hr tablet TAKE 1.5 TABLETS (37.5 MG TOTAL) BY MOUTH DAILY.  135 tablet  1  . mupirocin ointment (BACTROBAN) 2 % Apply 1 application topically 2 (two) times daily.  30 g  2  . pantoprazole (PROTONIX) 40 MG tablet Take 40 mg by mouth daily at 12 noon.      . sulfamethoxazole-trimethoprim (SEPTRA DS) 800-160 MG per tablet Take 2 tablets by mouth 2 (two) times daily.  28 tablet  0   No current facility-administered medications for this visit.     Allergies:   Bee venom   Social History:  The patient  reports that he has quit smoking. He quit smokeless tobacco use about 4 years ago. He reports that he does not drink alcohol or use illicit drugs.   Family History:  The patient's family history includes COPD in his father; Diabetes in his brother and daughter; Heart attack in his brother; Heart attack (age of onset: 81) in his mother; Heart disease in his brother and mother; Hyperlipidemia in his mother; Hypertension in his brother, mother, and another family member; Peripheral vascular disease in his father; Stroke in his mother. There is no history of Colon cancer or Prostate cancer.   ROS:  Please see the history of present illness.   Bactrim recently started for UTI/?cellulitis.  No vomiting, diarrhea, melena, hematochezia, fevers.    All other systems reviewed and negative.   PHYSICAL EXAM: VS:  BP 143/87  Pulse 56 Chronically ill appearing male in wheelchair in no acute distress HEENT: normal Neck: no JVDat 90 degrees Cardiac:  normal S1, S2; RRR; no murmur Lungs:  clear to auscultation bilaterally, no wheezing, rhonchi or rales Abd: soft, nontender, no hepatomegaly Ext: bilateral AKAs; no thigh edemanoted Skin: warm and dry Neuro:  CNs 2-12 intact, no focal abnormalities noted  EKG:  Sinus brady, HR 56, LAD, 1st degree AVB (PR 292), IVCD, NSSTTW changes, no change from prior  tracing      ASSESSMENT AND PLAN:  Shortness of breath:  It is difficult to know the etiology of his dyspnea. He certainly is at risk for volume excess given his significantly reduced ejection fraction. I will check a basic metabolic panel and BNP. If his BNP is significantly elevated, add low-dose furosemide.  Ischemic Cardiomyopathy:  Continue current dose of beta blocker and ACE inhibitor.  Coronary artery disease:  No apparent angina. Continue aspirin, statin, beta blocker.  Essential hypertension:  Fair control. Continue current therapy. Continue to monitor.  Other and unspecified hyperlipidemia:  Continue statin.  Carotid Stenosis - Plan: Carotid duplex  PAD (peripheral artery disease):  He is status post bilateral AKA.  Multiple sclerosis:  He is unable to tolerate prosthetic limbs.   Disposition:  Follow up with Dr. Ron Parker in 3 months.   Signed, Versie Starks, MHS 02/21/2014 11:18 AM    [facility] [address], [place], [place]  [address] Phone: [phone]; Fax: [phone]

## 2014-02-21 NOTE — Telephone Encounter (Signed)
Please tell Jaime [name] that I reviewed his case with Dr. Cleatis Polka.  We want to repeat his echo. Please arrange 2D echo. Dx:  414.8. [name], PA-C   02/21/2014 5:29 PM

## 2014-02-22 ENCOUNTER — Telehealth: Payer: Self-pay | Admitting: *Deleted

## 2014-02-22 DIAGNOSIS — R0602 Shortness of breath: Secondary | ICD-10-CM

## 2014-02-22 NOTE — Telephone Encounter (Signed)
pt's wife notified about both lab results and per Brynda Rim. PA and Dr. Ron Parker to repeat echo. which has now been scheduled 8/19 same day as his carotid appt in our office.Marland Kitchen

## 2014-02-22 NOTE — Telephone Encounter (Signed)
lmptcb for lab results and advised per Brynda Rim. PA and Dr. Ron Parker conversation yesterday that they would like to repeat echo.

## 2014-02-23 ENCOUNTER — Ambulatory Visit (INDEPENDENT_AMBULATORY_CARE_PROVIDER_SITE_OTHER): Payer: Medicare Other | Admitting: Internal Medicine

## 2014-02-23 VITALS — BP 122/81 | HR 54 | Temp 98.4°F

## 2014-02-23 DIAGNOSIS — I2589 Other forms of chronic ischemic heart disease: Secondary | ICD-10-CM | POA: Diagnosis not present

## 2014-02-23 DIAGNOSIS — N390 Urinary tract infection, site not specified: Secondary | ICD-10-CM | POA: Diagnosis not present

## 2014-02-23 DIAGNOSIS — G35 Multiple sclerosis: Secondary | ICD-10-CM

## 2014-02-23 NOTE — Assessment & Plan Note (Signed)
Long history of MS, also diabetes, peripheral vascular disease, status post AKA bilaterally. Here mostly for evaluation of the need to have a wheelchair. See history of present illness, he most definitely needs it  and is willing and able to use it. Paperwork completed

## 2014-02-23 NOTE — Progress Notes (Signed)
Subjective:    Patient ID: Jaime [name], male    DOB: August 12, 1940, 73 y.o.   MRN: [id number]  DOS:  02/23/2014 Type of visit - description: Main reason for visit is evaluation for a power wheelchair History:  I have reviewed and agree with the PT evaluation, a tilt and recline is needed because pt is unable to shift body weight . Also has a h/o pressure ulcers, currently healed but is important to have appropriate cushion in his wheelchair . Pt does have the physical and mental ability to operate a power wheelchair ; he is willing to use it consistently at home    ROS 2 urine cultures positive for MRSA, last was 02/12/2014, at the time he had dysuria and low-grade fever. Status post Bactrim, he is now completely asymptomatic  Past Medical History  Diagnosis Date  . Hyperlipidemia   . Cardiomyopathy, ischemic     EF 45% per ECHO 2008  //   EF 25%, echo, August, 2013  . Pulmonary hypertension     moderate ECHO Jan 2008  . Systolic heart failure   . Multiple sclerosis [place] -- LAST VISIT 11-20-2010  NOTE W/ CHART  . Lung nodule     resolved 11-2006 CT Chest  . Tobacco abuse     quit   . Increased prostate specific antigen (PSA) velocity   . Urinary retention     dx ~ 2-12, like from [place], now with a catheter, saw urology  . Carotid artery disease     Doppler, February, 2012, 0-39% bilateral,Mild smooth plaque  . Chronic indwelling Foley catheter   . Scoliosis associated with other condition   . Hemiparesis   . Fatigue SEVERE  . Impotence   . MI, acute, non ST segment elevation 2007    S/P PCI WITH X1 STENT (TAXUS DRUG-ELUTING) LEFT CIRCUMFLEX  . Diabetes mellitus     INSULIN-DEPENDANT  . Hypertension   . H/O pleural effusion 2008    POST THORACENTESIS  . History of colon polyps PRECANCEROUS  . Insomnia   . CAD (coronary artery disease) CARDIOLOGIST- DR KATZ-- VISIT 06-05-2011 IN EPIC    non-STEMI, 2007.Marland Kitchenoccluded circumflex.. Taxus stent  placed...residual 80% LAD...50% RCA  . Urinary tract infection     hx of  . Atherosclerotic PVD with ulceration     left foot  . PAC (premature atrial contraction)     December, 2013    Past Surgical History  Procedure Laterality Date  . Hernia repair  1990    (R)  . Thoracentesis  2008    PLEURAL EFFUSION  . Cystoscopy  07/30/2011    Procedure: CYSTOSCOPY;  Surgeon: Hanley Ben, MD;  Location: [facility];  Service: Urology;  Laterality: N/A;  . Transurethral resection of prostate  07/30/2011    Procedure: TRANSURETHRAL RESECTION OF THE PROSTATE (TURP);  Surgeon: Hanley Ben, MD;  Location: [facility];  Service: Urology;  Laterality: N/A;  . Femoral-tibial bypass graft  03/11/2012    Procedure: BYPASS GRAFT FEMORAL-TIBIAL ARTERY;  Surgeon: Conrad [place], MD;  Location: [facility];  Service: Vascular;  Laterality: Left;  Left Femoral -Tibial trunk bypass, Endarterectomy of Tibial- Peroneal trunk with vein angioplasty.  . Intraoperative arteriogram  03/11/2012    Procedure: INTRA OPERATIVE ARTERIOGRAM;  Surgeon: Conrad [place], MD;  Location: [facility];  Service: Vascular;  Laterality: Left;  . Femoral-popliteal bypass graft  03/11/2012    Procedure: BYPASS  GRAFT FEMORAL-POPLITEAL ARTERY;  Surgeon: Conrad Harrison, MD;  Location: [facility];  Service: Vascular;  Laterality: Left;  embolectomy left lower leg  . Amputation  03/17/2012    Procedure: AMPUTATION ABOVE KNEE;  Surgeon: Conrad Wright, MD;  Location: [facility];  Service: Vascular;  Laterality: Left;  . Coronary angioplasty with stent placement  05-01-2006    OCCLUDED CIRCUMFLEX -- TAXUS STENT PLACMENT  AND RESIDUAL 80% LAD,  50% RCA  . Amputation  06/03/2012    Procedure: AMPUTATION ABOVE KNEE;  Surgeon: Conrad [place], MD;  Location: [facility];  Service: Vascular;  Laterality: Right;    History   Social History  . Marital Status: Married    Spouse Name: N/A    Number of Children: 2  . Years of Education: N/A    Occupational History  . disable    Social History Main Topics  . Smoking status: Former Smoker -- 43 years  . Smokeless tobacco: Former Systems developer    Quit date: 08/05/2009     Comment: pt states that he is using E-cigs  . Alcohol Use: No  . Drug Use: No  . Sexual Activity: Not on file     Comment: electronic cigarettes   Other Topics Concern  . Not on file   Social History Narrative   Lives w/ wife        Medication List       This list is accurate as of: 02/23/14 12:21 PM.  Always use your most recent med list.               acetaminophen-codeine 300-60 MG per tablet  Commonly known as:  TYLENOL #4  Take 1 tablet by mouth 3 (three) times daily.     aspirin EC 325 MG tablet  Take 325 mg by mouth daily.     atorvastatin 40 MG tablet  Commonly known as:  LIPITOR  TAKE 1 TABLET (40 MG TOTAL) BY MOUTH AT BEDTIME.     baclofen 10 MG tablet  Commonly known as:  LIORESAL  Take 5 mg by mouth 2 (two) times daily.     benazepril 10 MG tablet  Commonly known as:  LOTENSIN  TAKE 1 TABLET BY MOUTH EVERY DAY     diclofenac sodium 1 % Gel  Commonly known as:  VOLTAREN  Apply 1 application topically 4 (four) times daily as needed. For pain     gabapentin 300 MG capsule  Commonly known as:  NEURONTIN  Take 2 capsules (600 mg total) by mouth 3 (three) times daily.     GLUCAGEN 1 MG Solr injection  Generic drug:  glucagon  Inject 1 mg into the vein once as needed for low blood sugar.     insulin aspart protamine- aspart (70-30) 100 UNIT/ML injection  Commonly known as:  NOVOLOG MIX 70/30  Inject 15-35 Units into the skin 2 (two) times daily with a meal. Take 35 units in the morning with breakfast and take 15 units at supper     metoprolol succinate 25 MG 24 hr tablet  Commonly known as:  TOPROL-XL  TAKE 1.5 TABLETS (37.5 MG TOTAL) BY MOUTH DAILY.     mupirocin ointment 2 %  Commonly known as:  BACTROBAN  Apply 1 application topically 2 (two) times daily.      pantoprazole 40 MG tablet  Commonly known as:  PROTONIX  Take 40 mg by mouth daily at 12 noon.     sulfamethoxazole-trimethoprim 800-160 MG per tablet  Commonly known as:  SEPTRA DS  Take 2 tablets by mouth 2 (two) times daily.           Objective:   Physical Exam BP 122/81  Pulse 54  Temp(Src) 98.4 F (36.9 C) (Oral)  SpO2 100% General -- alert, well-developed, NAD.   Extremities-- s/p AKA bilaterally , Upper extremities strength bilateraly: shoulder 4/5, elbow 3/5, wrist 3/5, supination-pronation 4/5. Neurologic--  alert & oriented X3. Speech normal  Psych-- Cognition and judgment appear intact. Cooperative with normal attention span and concentration. No anxious or depressed appearing.        Assessment & Plan:    Today , I spent more than  25  min with the patient: Mostly  coordinating his care

## 2014-02-23 NOTE — Assessment & Plan Note (Signed)
Urine culture are 11-01-2013 and  02/12/2014 showed MRSA. Status post Bactrim after the last urine culture, low grade  fever and dysuria gone. Plan: Culture and treat with antibiotics Only if he becomes symptomatic again, patient in agreement.

## 2014-02-23 NOTE — Patient Instructions (Signed)
Call anytime if you have fever, change in the color of the urine, lower stomach discomfort, burning with urination.

## 2014-03-09 ENCOUNTER — Ambulatory Visit (HOSPITAL_COMMUNITY): Payer: Medicare Other | Attending: Cardiovascular Disease | Admitting: Cardiology

## 2014-03-09 ENCOUNTER — Ambulatory Visit (HOSPITAL_BASED_OUTPATIENT_CLINIC_OR_DEPARTMENT_OTHER): Payer: Medicare Other | Admitting: Cardiology

## 2014-03-09 DIAGNOSIS — I6529 Occlusion and stenosis of unspecified carotid artery: Secondary | ICD-10-CM | POA: Diagnosis not present

## 2014-03-09 DIAGNOSIS — R0602 Shortness of breath: Secondary | ICD-10-CM | POA: Diagnosis not present

## 2014-03-09 DIAGNOSIS — I2510 Atherosclerotic heart disease of native coronary artery without angina pectoris: Secondary | ICD-10-CM

## 2014-03-09 NOTE — Progress Notes (Signed)
Echo performed. 

## 2014-03-09 NOTE — Progress Notes (Signed)
Carotid duplex performed 

## 2014-03-12 ENCOUNTER — Encounter: Payer: Self-pay | Admitting: Physician Assistant

## 2014-03-14 ENCOUNTER — Encounter: Payer: Self-pay | Admitting: Cardiology

## 2014-03-14 NOTE — Progress Notes (Unsigned)
Patient ID: Jaime [name], male   DOB: 20-Jun-1941, 73 y.o.   MRN: [id number]  The patient was seen recently in the office by Mr. Kathlen Mody. I have reviewed his note and agree completely. I have also reviewed the prior history. The patient had an ejection fraction in the 45% range by echo in 2008. I have reviewed all of the echoes. The echo in March 03, 2012 revealed significant inferior and inferolateral hypokinesis. Ejection fraction was in the 25-30% range. The echo from August, 2015 again shows inferior and inferolateral severe hypokinesis. However the overall ejection fraction probably is a little better in the range of 30-35%.  The patient has significant multiple sclerosis. He also has multiple other medical problems. He has recurrent urinary tract infections. Considering all of these factors, I have felt over time that an ICD was not appropriate.  The plan will be for continued medical therapy as outlined by Mr. Kathlen Mody.  Daryel November, MD

## 2014-03-15 ENCOUNTER — Encounter: Payer: Self-pay | Admitting: Physical Medicine & Rehabilitation

## 2014-03-15 ENCOUNTER — Ambulatory Visit (HOSPITAL_BASED_OUTPATIENT_CLINIC_OR_DEPARTMENT_OTHER): Payer: Medicare Other | Admitting: Physical Medicine & Rehabilitation

## 2014-03-15 ENCOUNTER — Encounter: Payer: Medicare Other | Attending: Physical Medicine & Rehabilitation

## 2014-03-15 VITALS — BP 146/86 | HR 62 | Resp 14

## 2014-03-15 DIAGNOSIS — G546 Phantom limb syndrome with pain: Secondary | ICD-10-CM

## 2014-03-15 DIAGNOSIS — G547 Phantom limb syndrome without pain: Secondary | ICD-10-CM

## 2014-03-15 DIAGNOSIS — I2589 Other forms of chronic ischemic heart disease: Secondary | ICD-10-CM | POA: Diagnosis not present

## 2014-03-15 DIAGNOSIS — G35 Multiple sclerosis: Secondary | ICD-10-CM

## 2014-03-15 DIAGNOSIS — M4130 Thoracogenic scoliosis, site unspecified: Secondary | ICD-10-CM | POA: Diagnosis not present

## 2014-03-15 DIAGNOSIS — Z89612 Acquired absence of left leg above knee: Secondary | ICD-10-CM

## 2014-03-15 DIAGNOSIS — Z89611 Acquired absence of right leg above knee: Secondary | ICD-10-CM

## 2014-03-15 DIAGNOSIS — S78119A Complete traumatic amputation at level between unspecified hip and knee, initial encounter: Secondary | ICD-10-CM | POA: Insufficient documentation

## 2014-03-15 MED ORDER — ACETAMINOPHEN-CODEINE #4 300-60 MG PO TABS: 1.0000 | ORAL_TABLET | Freq: Three times a day (TID) | ORAL | Status: DC

## 2014-03-15 NOTE — Progress Notes (Signed)
Subjective:    Patient ID: Jaime [name], male    DOB: 22-Dec-1940, 73 y.o.   MRN: [id number]  HPI Patient treated for MRSA infection. Was on Bactrim by his family doctor Patient also seen by cardiology. Repeat echocardiogram improved to ejection fraction of 35%  Patient has mild shoulder pain does not feel like he needs another injection at this point. Last injection performed in May of 2015  Has been reevaluated for any new power wheelchair. Because of progressive scoliosis he has had to be be measured. He is able to drive his power will chair without difficulty. He is motivated to continue using it.  He is unable to propel a manual chair due to Christmas as well as shoulder problems as well as congestive heart failure Pain Inventory Average Pain 8 Pain Right Now 0 My pain is intermittent  In the last 24 hours, has pain interfered with the following? General activity 0 Relation with others 0 Enjoyment of life 0 What TIME of day is your pain at its worst? morning Sleep (in general) Fair  Pain is worse with: unsure Pain improves with: medication Relief from Meds: 8  Mobility use a wheelchair needs help with transfers  Function disabled: date disabled 1995 retired I need assistance with the following:  dressing, bathing, toileting, meal prep, household duties and shopping  Neuro/Psych bladder control problems bowel control problems weakness  Prior Studies Any changes since last visit?  no  Physicians involved in your care Any changes since last visit?  no   Family History  Problem Relation Age of Onset  . Hypertension    . Heart attack Mother 26  . Heart disease Mother   . Stroke Mother   . Hyperlipidemia Mother   . Hypertension Mother   . Colon cancer Neg Hx   . Prostate cancer Neg Hx   . COPD Father   . Peripheral vascular disease Father   . Diabetes Brother   . Heart disease Brother   . Hypertension Brother   . Heart attack Brother   . Diabetes  Daughter    History   Social History  . Marital Status: Married    Spouse Name: N/A    Number of Children: 2  . Years of Education: N/A   Occupational History  . disable    Social History Main Topics  . Smoking status: Former Smoker -- 61 years  . Smokeless tobacco: Former Systems developer    Quit date: 08/05/2009     Comment: pt states that he is using E-cigs  . Alcohol Use: No  . Drug Use: No  . Sexual Activity: None     Comment: electronic cigarettes   Other Topics Concern  . None   Social History Narrative   Lives w/ wife   Past Surgical History  Procedure Laterality Date  . Hernia repair  1990    (R)  . Thoracentesis  2008    PLEURAL EFFUSION  . Cystoscopy  07/30/2011    Procedure: CYSTOSCOPY;  Surgeon: Hanley Ben, MD;  Location: [facility];  Service: Urology;  Laterality: N/A;  . Transurethral resection of prostate  07/30/2011    Procedure: TRANSURETHRAL RESECTION OF THE PROSTATE (TURP);  Surgeon: Hanley Ben, MD;  Location: [facility];  Service: Urology;  Laterality: N/A;  . Femoral-tibial bypass graft  03/11/2012    Procedure: BYPASS GRAFT FEMORAL-TIBIAL ARTERY;  Surgeon: Conrad [place], MD;  Location: [facility];  Service: Vascular;  Laterality: Left;  Left Femoral -Tibial trunk bypass, Endarterectomy of Tibial- Peroneal trunk with vein angioplasty.  . Intraoperative arteriogram  03/11/2012    Procedure: INTRA OPERATIVE ARTERIOGRAM;  Surgeon: Conrad [place], MD;  Location: [facility];  Service: Vascular;  Laterality: Left;  . Femoral-popliteal bypass graft  03/11/2012    Procedure: BYPASS GRAFT FEMORAL-POPLITEAL ARTERY;  Surgeon: Conrad [place], MD;  Location: [facility];  Service: Vascular;  Laterality: Left;  embolectomy left lower leg  . Amputation  03/17/2012    Procedure: AMPUTATION ABOVE KNEE;  Surgeon: Conrad [place], MD;  Location: [facility];  Service: Vascular;  Laterality: Left;  . Coronary angioplasty with stent placement  05-01-2006    OCCLUDED  CIRCUMFLEX -- TAXUS STENT PLACMENT  AND RESIDUAL 80% LAD,  50% RCA  . Amputation  06/03/2012    Procedure: AMPUTATION ABOVE KNEE;  Surgeon: Conrad [place]rill, MD;  Location: [facility];  Service: Vascular;  Laterality: Right;   Past Medical History  Diagnosis Date  . Hyperlipidemia   . Cardiomyopathy, ischemic     EF 45% per ECHO 2008  //   EF 25%, echo, August, 2013 //  Echo (8/15):  Mild LVH, EF 30-35%, ant-lat and lat AK, inf HK, Gr 1 DD, mild MR, mild LAE  . Pulmonary hypertension     moderate ECHO Jan 2008  . Systolic heart failure   . Multiple sclerosis [place] -- LAST VISIT 11-20-2010  NOTE W/ CHART  . Lung nodule     resolved 11-2006 CT Chest  . Tobacco abuse     quit   . Increased prostate specific antigen (PSA) velocity   . Urinary retention     dx ~ 2-12, like from [place], now with a catheter, saw urology  . Carotid artery disease     a.  Doppler, February, 2012, 0-39% bilateral,Mild smooth plaque;  b.  Carotid US (8/15):  Bilateral 1-39% ICA >>> F/u 2 years  . Chronic indwelling Foley catheter   . Scoliosis associated with other condition   . Hemiparesis   . Fatigue SEVERE  . Impotence   . MI, acute, non ST segment elevation 2007    S/P PCI WITH X1 STENT (TAXUS DRUG-ELUTING) LEFT CIRCUMFLEX  . Diabetes mellitus     INSULIN-DEPENDANT  . Hypertension   . H/O pleural effusion 2008    POST THORACENTESIS  . History of colon polyps PRECANCEROUS  . Insomnia   . CAD (coronary artery disease) CARDIOLOGIST- DR KATZ-- VISIT 06-05-2011 IN EPIC    non-STEMI, 2007.Marland Kitchenoccluded circumflex.. Taxus stent placed...residual 80% LAD...50% RCA  . Urinary tract infection     hx of  . Atherosclerotic PVD with ulceration     left foot  . PAC (premature atrial contraction)     December, 2013   BP 146/86  Pulse 62  Resp 14  SpO2 97%  Opioid Risk Score:   Fall Risk Score: Low Fall Risk (0-5 points)  Review of Systems  Constitutional: Positive for appetite change.    Gastrointestinal:       Bowel Control Problems  Genitourinary:       Bladder control problems  Skin: Positive for rash.  Neurological: Positive for weakness.  All other systems reviewed and are negative.      Objective:   Physical Exam  Nursing note and vitals reviewed. Constitutional: He is oriented to person, place, and time. He appears well-developed and well-nourished.  HENT:  Head: Atraumatic.  Eyes: Conjunctivae and  EOM are normal. Pupils are equal, round, and reactive to light.  Musculoskeletal:  dextroconvex thoracolumbar scoliosis  Bilateral AKA well healed.mild distal tenderness on Right side  Neurological: He is alert and oriented to person, place, and time.  R side 4/5 delt, bi, tri, grip L side 5/5 delt , bi, tri, grip  Psychiatric: He has a normal mood and affect.   Positive Hawkins maneuver right shoulder       Assessment & Plan:  1.  Scoliosis thoracolumbar with chronic pain. Continue Tylenol #4, 3 times per day as needed  2.  MS with chronic R Hemiparesis  3.  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  R subacromial impingement overall improved pt to call for repeat injection if needed

## 2014-03-15 NOTE — Progress Notes (Signed)
   Subjective:    Patient ID: Jaime [name], male    DOB: 1940-11-02, 73 y.o.   MRN: [id number]  HPI    Review of Systems     Objective:   Physical Exam        Assessment & Plan:

## 2014-03-15 NOTE — Patient Instructions (Signed)
Call for right shoulder injection if pain becomes worse

## 2014-03-17 DIAGNOSIS — IMO0001 Reserved for inherently not codable concepts without codable children: Secondary | ICD-10-CM | POA: Diagnosis not present

## 2014-04-02 IMAGING — CR DG CHEST 2V
2 series · 2 of 2 positions shown · non-contrast
Comparison: 01/29/2011

CLINICAL DATA: Preop.  Hypertension.

CHEST - 2 VIEW

[view not recorded (1 of 2)]
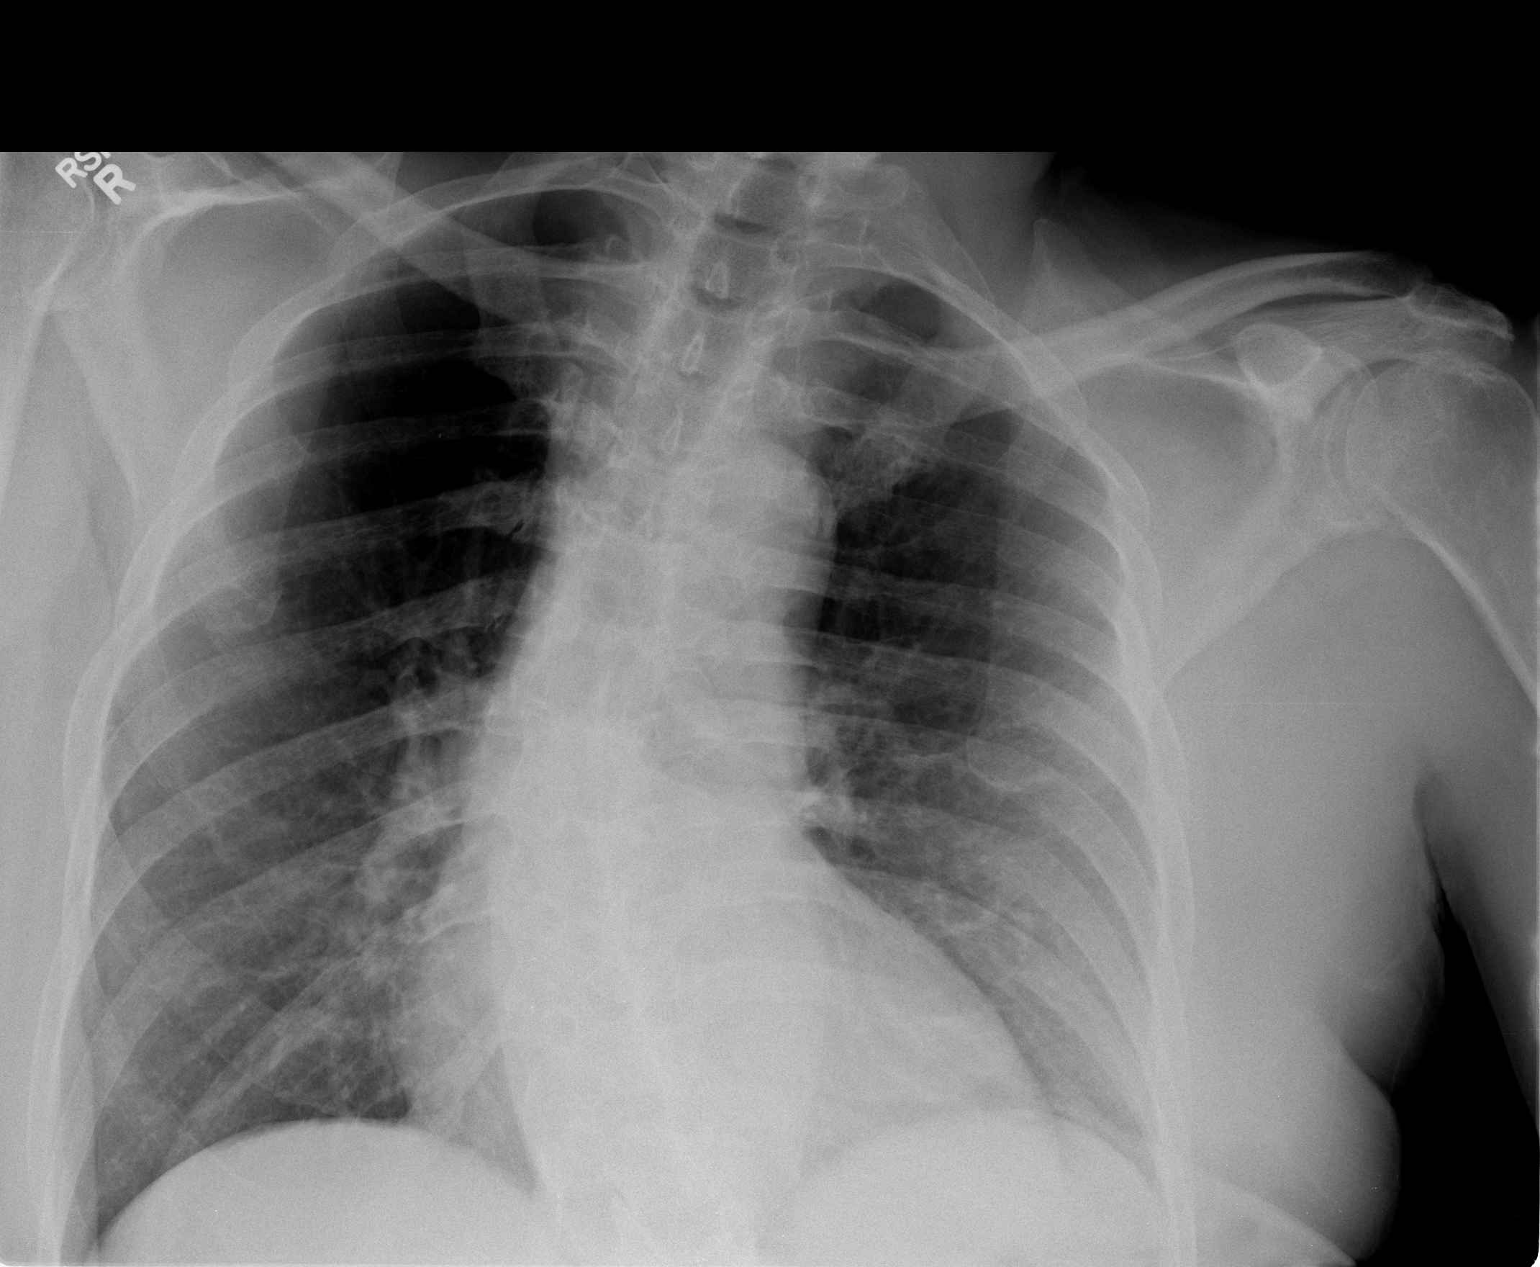

[view not recorded (2 of 2)]
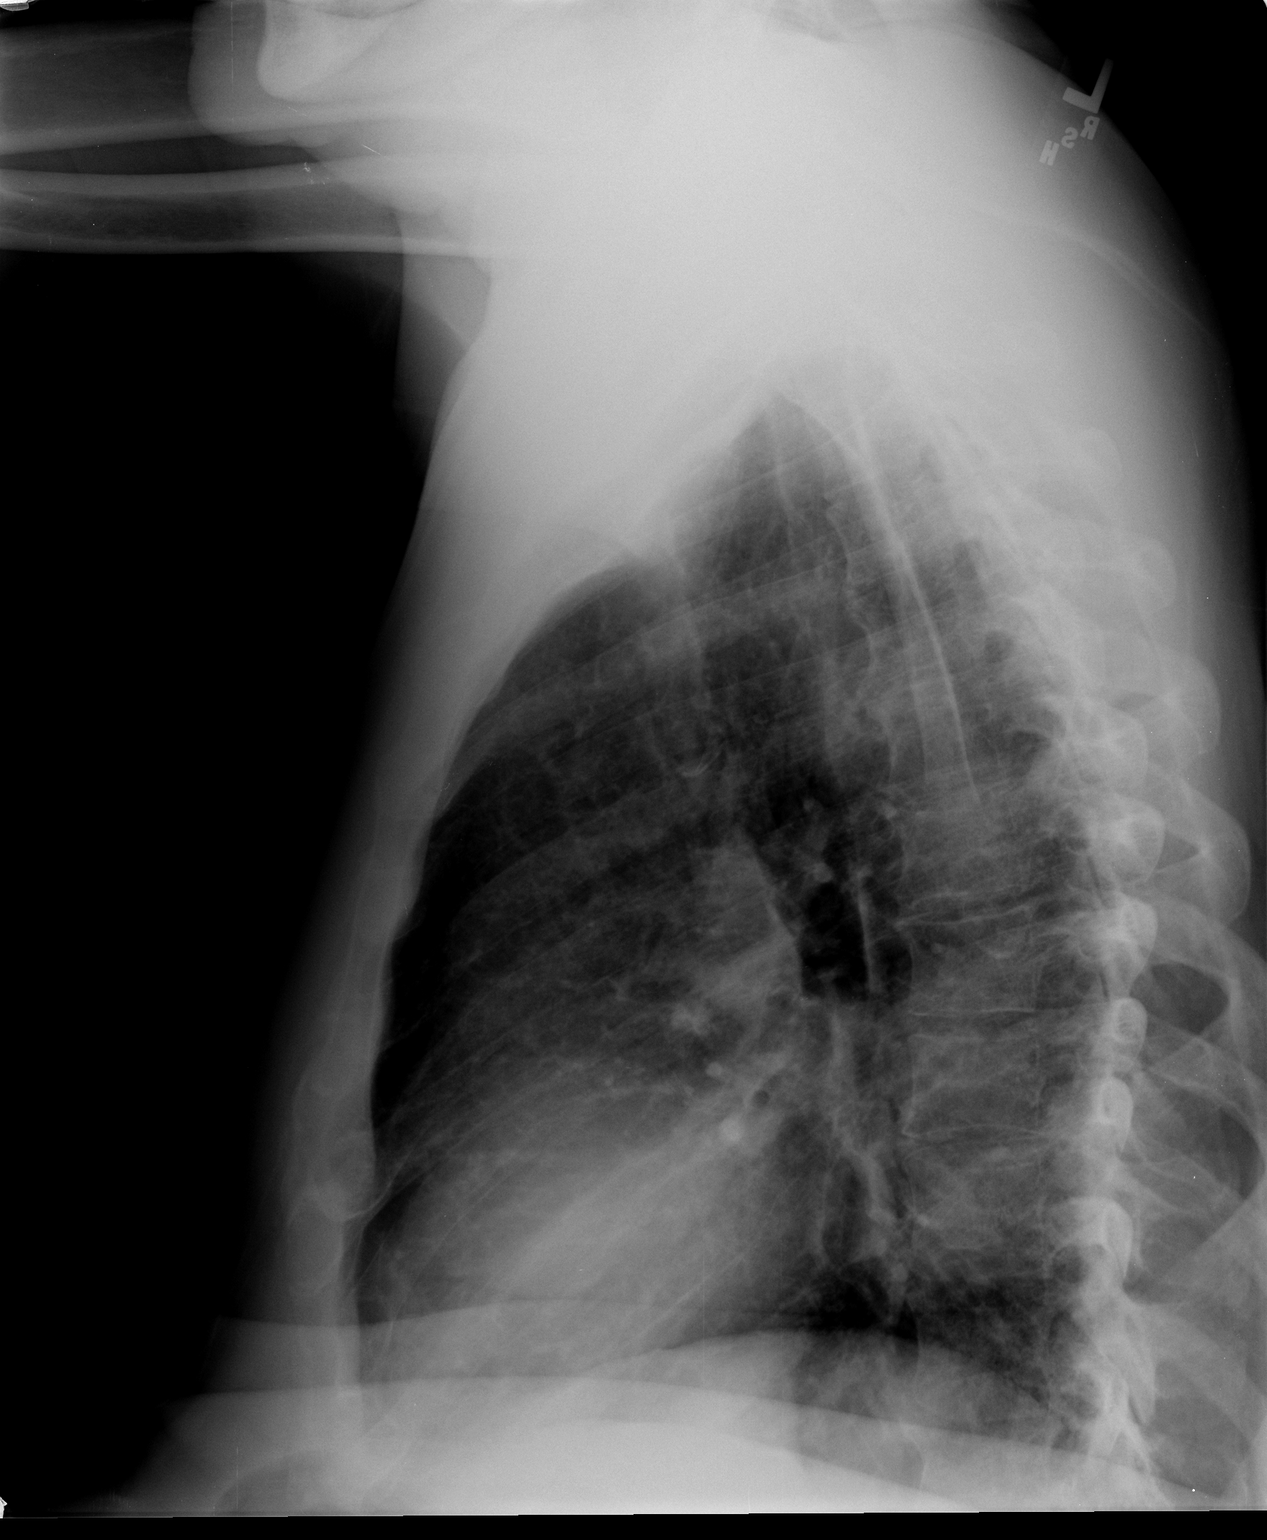

[2 of 2 positions shown; findings below may reference images not displayed]

FINDINGS: Rightward scoliosis in the thoracic spine. Heart and
mediastinal contours are within normal limits.  No focal opacities
or effusions.  No acute bony abnormality.
IMPRESSION: No active cardiopulmonary disease.

## 2014-04-05 ENCOUNTER — Telehealth: Payer: Self-pay | Admitting: Internal Medicine

## 2014-04-05 NOTE — Telephone Encounter (Signed)
FYI

## 2014-04-05 NOTE — Telephone Encounter (Signed)
thx

## 2014-04-05 NOTE — Telephone Encounter (Signed)
Caller name:[name] Escalante Relation to XY:[id number] Call back [phone] Pharmacy:  Reason for call: pt's wife states [facility] will be sending dr. Larose Kells a fax for an order to get catheter supplies and bed pads, diapers. Pt's wife wanted dr.paz to be aware of this before he received the fax.

## 2014-04-15 IMAGING — CT CT HEAD W/O CM
1 of 2 series · 13 of 30 positions shown, 17 images · non-contrast
Comparison: 08/19/2006

CLINICAL DATA: Confusion post amputation.

CT HEAD WITHOUT CONTRAST
TECHNIQUE: Contiguous axial images were obtained from the base of
the skull through the vertex without contrast.

[Series 2: brain · axial · 0.49mm/px · z∈[+130,+261]mm · 13 of 32 slices shown, 17 images]
[im 3/32  brain]
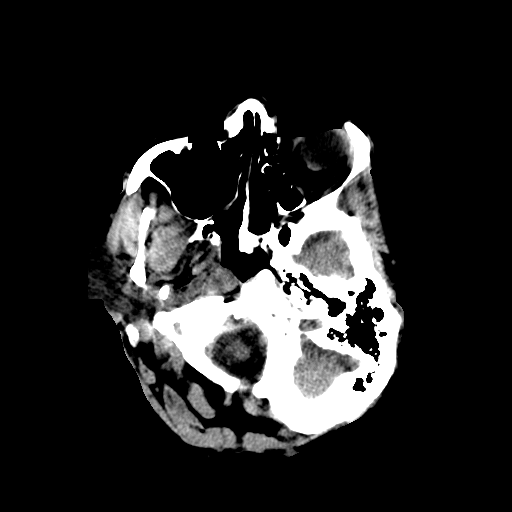
[im 3/32  bone]
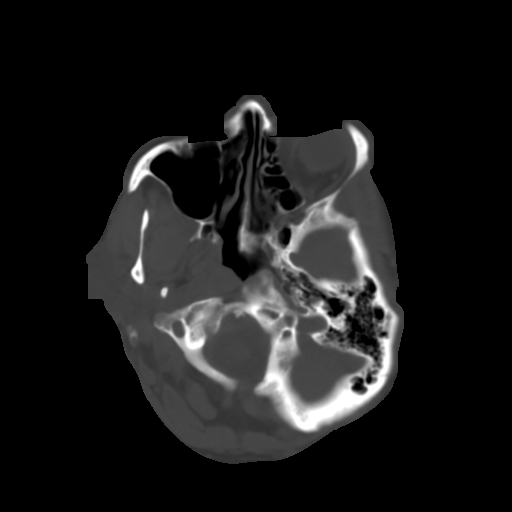
[im 5/32  brain]
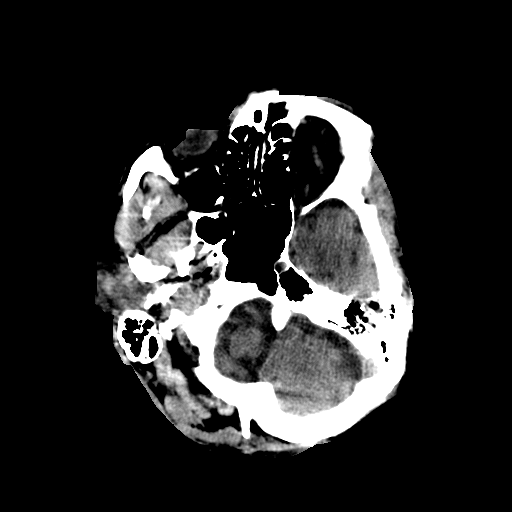
[im 7/32  brain]
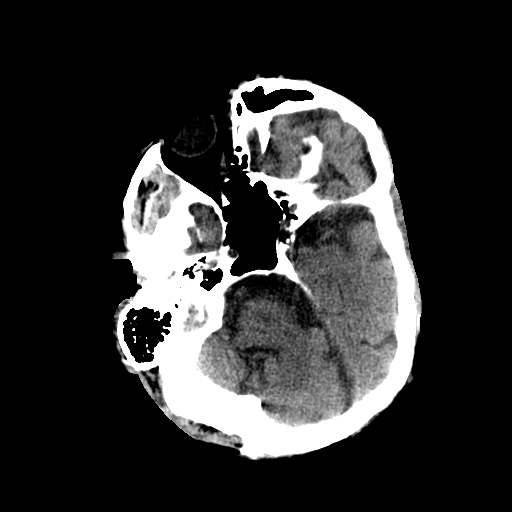
[im 9/32  brain]
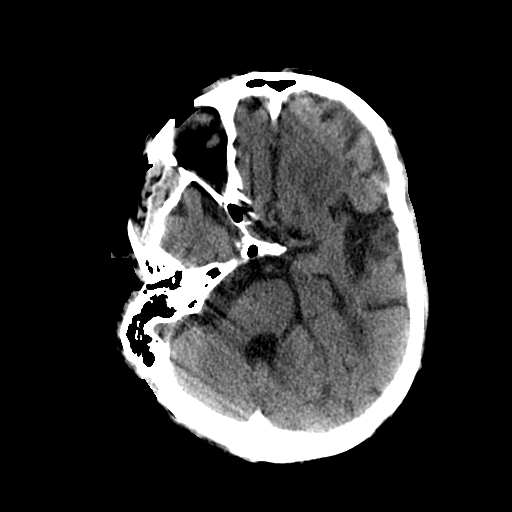
[im 12/32  brain]
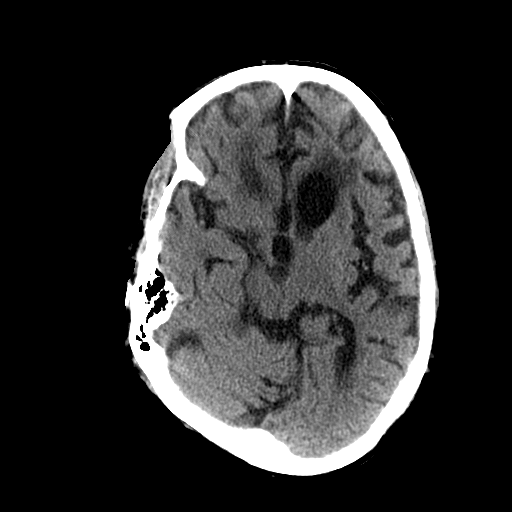
[im 12/32  bone]
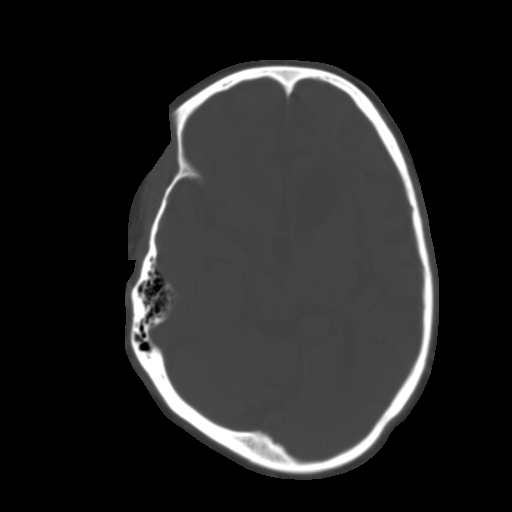
[im 14/32  brain]
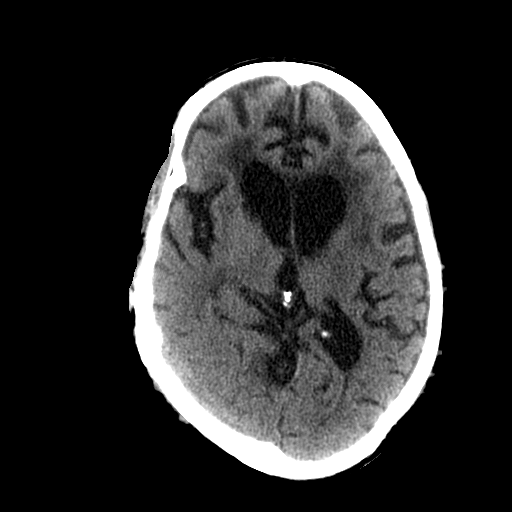
[im 16/32  brain]
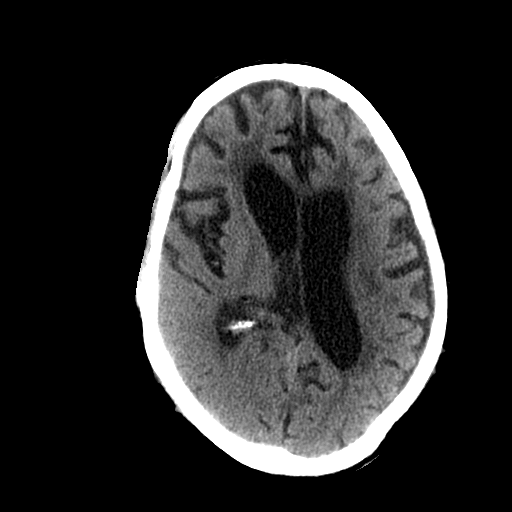
[im 18/32  brain]
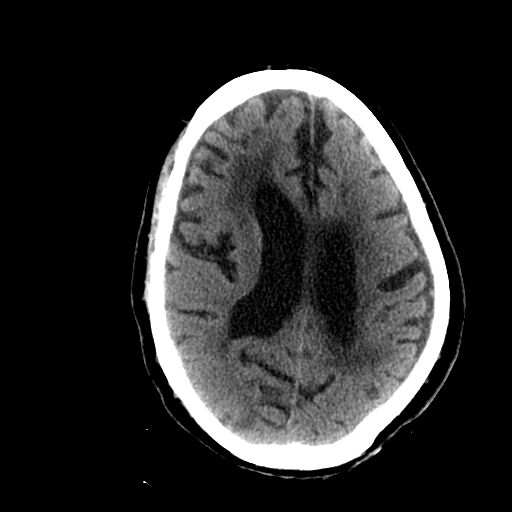
[im 20/32  brain]
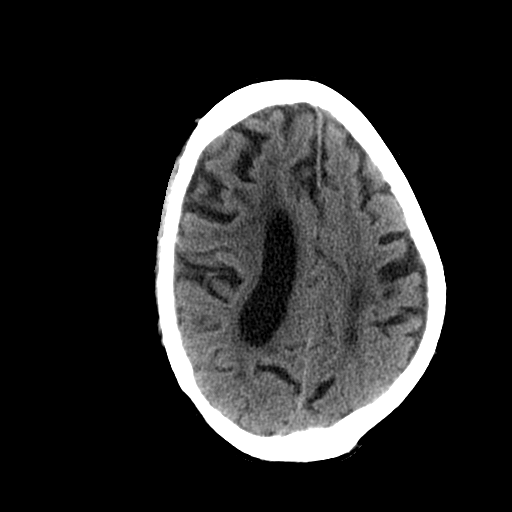
[im 20/32  bone]
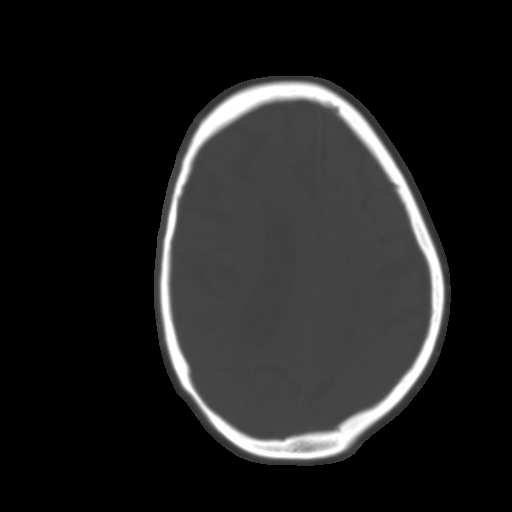
[im 23/32  brain]
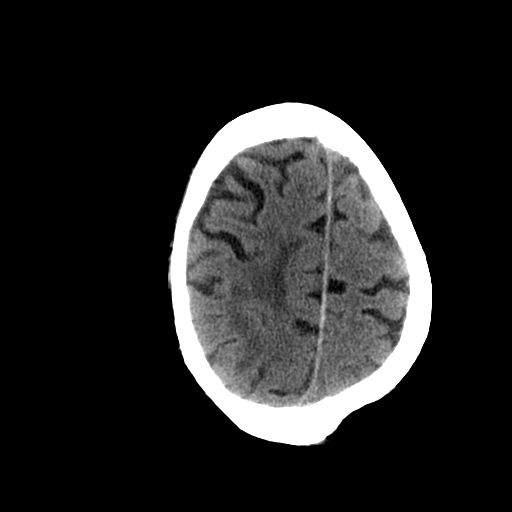
[im 25/32  brain]
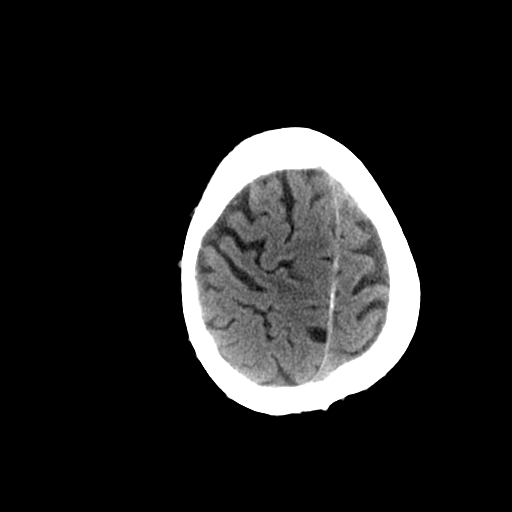
[im 27/32  brain]
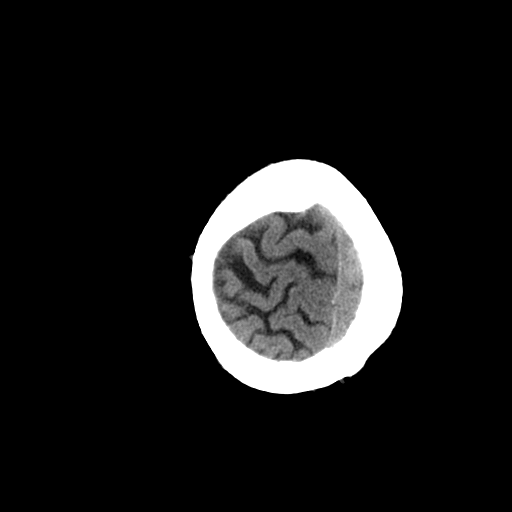
[im 29/32  brain]
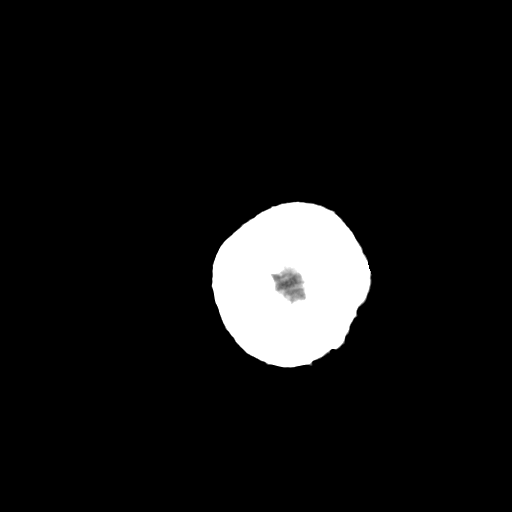
[im 29/32  bone]
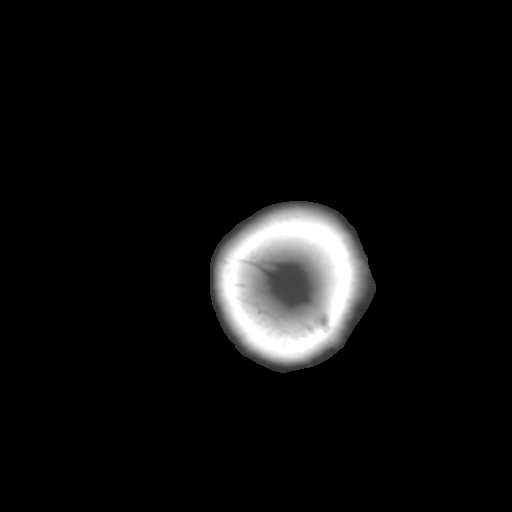

[13 of 30 positions shown; findings below may reference images not displayed]

FINDINGS: Atherosclerotic and physiologic intracranial
calcifications. Diffuse parenchymal atrophy. Patchy areas of
hypoattenuation in deep and periventricular white matter
bilaterally. Negative for acute intracranial hemorrhage, mass
lesion, acute infarction, midline shift, or mass-effect. Acute
infarct may be inapparent on noncontrast CT. Ventricles and sulci
symmetric. Bone windows demonstrate no focal lesion.
IMPRESSION: 1. Negative for bleed or other acute intracranial process.

2. Atrophy and nonspecific white matter changes.

## 2014-05-12 ENCOUNTER — Ambulatory Visit: Payer: Medicare Other | Admitting: Internal Medicine

## 2014-05-16 ENCOUNTER — Other Ambulatory Visit: Payer: Self-pay | Admitting: Physical Medicine & Rehabilitation

## 2014-05-17 ENCOUNTER — Ambulatory Visit: Payer: Medicare Other | Admitting: Internal Medicine

## 2014-05-18 ENCOUNTER — Ambulatory Visit: Payer: Medicare Other | Admitting: Internal Medicine

## 2014-05-19 ENCOUNTER — Other Ambulatory Visit: Payer: Self-pay

## 2014-05-25 ENCOUNTER — Ambulatory Visit (INDEPENDENT_AMBULATORY_CARE_PROVIDER_SITE_OTHER): Payer: Medicare Other | Admitting: Internal Medicine

## 2014-05-25 ENCOUNTER — Encounter: Payer: Self-pay | Admitting: Internal Medicine

## 2014-05-25 VITALS — BP 124/78 | HR 67 | Temp 97.8°F

## 2014-05-25 DIAGNOSIS — I10 Essential (primary) hypertension: Secondary | ICD-10-CM

## 2014-05-25 DIAGNOSIS — L309 Dermatitis, unspecified: Secondary | ICD-10-CM

## 2014-05-25 DIAGNOSIS — I2589 Other forms of chronic ischemic heart disease: Secondary | ICD-10-CM | POA: Diagnosis not present

## 2014-05-25 DIAGNOSIS — G35 Multiple sclerosis: Secondary | ICD-10-CM

## 2014-05-25 MED ORDER — MUPIROCIN 2 % EX OINT: 1.0000 "application " | TOPICAL_OINTMENT | Freq: Two times a day (BID) | CUTANEOUS | Status: DC

## 2014-05-25 NOTE — Progress Notes (Signed)
Subjective:    Patient ID: NOTNAMED Jaime [name], male    DOB: Dec 08, 1940, 73 y.o.   MRN: [id number]  DOS:  05/25/2014 Type of visit - description : rov Interval history, here with his wife in general they feel he is doing great. Medication list reviewed, good compliance Labs reviewed, not due for any blood work. Complaining of occasional pustules on the back, itchy, on and off. Edema?. High blood pressure, good compliance with medications, ambulatory BPs within normal Diabetes, sees endocrinology regularly  ROS Denies fever or chills No difficulty breathing or orthopnea No nausea, vomiting, diarrhea  Past Medical History  Diagnosis Date  . Hyperlipidemia   . Cardiomyopathy, ischemic     EF 45% per ECHO 2008  //   EF 25%, echo, August, 2013 //  Echo (8/15):  Mild LVH, EF 30-35%, ant-lat and lat AK, inf HK, Gr 1 DD, mild MR, mild LAE  . Pulmonary hypertension     moderate ECHO Jan 2008  . Systolic heart failure   . Multiple sclerosis [place] -- LAST VISIT 11-20-2010  NOTE W/ CHART  . Lung nodule     resolved 11-2006 CT Chest  . Tobacco abuse     quit   . Increased prostate specific antigen (PSA) velocity   . Urinary retention     dx ~ 2-12, like from [place], now with a catheter, saw urology  . Carotid artery disease     a.  Doppler, February, 2012, 0-39% bilateral,Mild smooth plaque;  b.  Carotid US (8/15):  Bilateral 1-39% ICA >>> F/u 2 years  . Chronic indwelling Foley catheter   . Scoliosis associated with other condition   . Hemiparesis   . Fatigue SEVERE  . Impotence   . MI, acute, non ST segment elevation 2007    S/P PCI WITH X1 STENT (TAXUS DRUG-ELUTING) LEFT CIRCUMFLEX  . Diabetes mellitus     INSULIN-DEPENDANT  . Hypertension   . H/O pleural effusion 2008    POST THORACENTESIS  . History of colon polyps PRECANCEROUS  . Insomnia   . CAD (coronary artery disease) CARDIOLOGIST- DR KATZ-- VISIT 06-05-2011 IN [facility]    non-STEMI,  2007.Marland Kitchenoccluded circumflex.. Taxus stent placed...residual 80% LAD...50% RCA  . Urinary tract infection     hx of  . Atherosclerotic PVD with ulceration     left foot  . PAC (premature atrial contraction)     December, 2013    Past Surgical History  Procedure Laterality Date  . Hernia repair  1990    (R)  . Thoracentesis  2008    PLEURAL EFFUSION  . Cystoscopy  07/30/2011    Procedure: CYSTOSCOPY;  Surgeon: Hanley Ben, MD;  [facility];  Service: Urology;  Laterality: N/A;  . Transurethral resection of prostate  07/30/2011    Procedure: TRANSURETHRAL RESECTION OF THE PROSTATE (TURP);  Surgeon: Hanley Ben, MD;  Location: [facility];  Service: Urology;  Laterality: N/A;  . Femoral-tibial bypass graft  03/11/2012    Procedure: BYPASS GRAFT FEMORAL-TIBIAL ARTERY;  Surgeon: Conrad [place], MD;  Location: [place];  Service: Vascular;  Laterality: Left;  Left Femoral -Tibial trunk bypass, Endarterectomy of Tibial- Peroneal trunk with vein angioplasty.  . Intraoperative arteriogram  03/11/2012    Procedure: INTRA OPERATIVE ARTERIOGRAM;  Surgeon: Conrad [place], MD;  Location: [facility];  Service: Vascular;  Laterality: Left;  . Femoral-popliteal bypass graft  03/11/2012    Procedure:  BYPASS GRAFT FEMORAL-POPLITEAL ARTERY;  Surgeon: Conrad [place], MD;  Location: [place];  Service: Vascular;  Laterality: Left;  embolectomy left lower leg  . Amputation  03/17/2012    Procedure: AMPUTATION ABOVE KNEE;  Surgeon: Conrad [place], MD;  Location: [place];  Service: Vascular;  Laterality: Left;  . Coronary angioplasty with stent placement  05-01-2006    OCCLUDED CIRCUMFLEX -- TAXUS STENT PLACMENT  AND RESIDUAL 80% LAD,  50% RCA  . Amputation  06/03/2012    Procedure: AMPUTATION ABOVE KNEE;  Surgeon: Conrad Brumley, MD;  Location: [place] [facility];  Service: Vascular;  Laterality: Right;    History   Social History  . Marital Status: Married    Spouse Name: N/A    Number of Children: 2   . Years of Education: N/A   Occupational History  . disable    Social History Main Topics  . Smoking status: Former Smoker -- 74 years  . Smokeless tobacco: Former Systems developer    Quit date: 08/05/2009     Comment: pt states that he is using E-cigs  . Alcohol Use: No  . Drug Use: No  . Sexual Activity: Not on file     Comment: electronic cigarettes   Other Topics Concern  . Not on file   Social History Narrative   Lives w/ wife        Medication List       This list is accurate as of: 05/25/14 11:59 PM.  Always use your most recent med list.               acetaminophen-codeine 300-60 MG per tablet  Commonly known as:  TYLENOL #4  Take 1 tablet by mouth 3 (three) times daily.     aspirin EC 325 MG tablet  Take 325 mg by mouth daily.     atorvastatin 40 MG tablet  Commonly known as:  LIPITOR  TAKE 1 TABLET (40 MG TOTAL) BY MOUTH AT BEDTIME.     baclofen 10 MG tablet  Commonly known as:  LIORESAL  TAKE 0.5 TABLETS (5 MG TOTAL) BY MOUTH 2 (TWO) TIMES DAILY     benazepril 10 MG tablet  Commonly known as:  LOTENSIN  TAKE 1 TABLET BY MOUTH EVERY DAY     diclofenac sodium 1 % Gel  Commonly known as:  VOLTAREN  Apply 1 application topically 4 (four) times daily as needed. For pain     gabapentin 300 MG capsule  Commonly known as:  NEURONTIN  Take 2 capsules (600 mg total) by mouth 3 (three) times daily.     GLUCAGEN 1 MG Solr injection  Generic drug:  glucagon  Inject 1 mg into the vein once as needed for low blood sugar.     insulin aspart protamine- aspart (70-30) 100 UNIT/ML injection  Commonly known as:  NOVOLOG MIX 70/30  Inject 15-35 Units into the skin 2 (two) times daily with a meal. Take 35 units in the morning with breakfast and take 15 units at supper     metoprolol succinate 25 MG 24 hr tablet  Commonly known as:  TOPROL-XL  TAKE 1.5 TABLETS (37.5 MG TOTAL) BY MOUTH DAILY.     mupirocin ointment 2 %  Commonly known as:  BACTROBAN  Apply 1  application topically 2 (two) times daily.     pantoprazole 40 MG tablet  Commonly known as:  PROTONIX  Take 40 mg by mouth daily at 12 noon.  Objective:   Physical Exam BP 124/78 mmHg  Pulse 67  Temp(Src) 97.8 F (36.6 C) (Oral)  SpO2 98%  General -- alert, well-developed, NAD. HEENT-- Not pale.    Lungs -- normal respiratory effort, no intercostal retractions, no accessory muscle use, and normal breath sounds.  Heart-- normal rate, regular rhythm, no murmur.  Abdomen-- Not distended, good bowel sounds,soft, non-tender. Extremities-- hips, buttock, thighs w/o pitting edema Skin-- no pustules or skin lesion on the back, he does have some postinflammatory hyperpigmentation Neurologic--  alert & oriented X3.   Psych-- Cognition and judgment appear intact. Cooperative with normal attention span and concentration. No anxious or depressed appearing, in very good spirits      Assessment & Plan:   Diabetes, per endocrinology [place], signed disability parking permit. Dermatitis, the patient reports pustules on the back, none seen today, recommend to use Bactroban as needed and call if they don't respond quickly to topical antibiotics Hypertension, good compliance w/ medication, BP excellent. Recent BMP okay Edema? No edema

## 2014-05-25 NOTE — Patient Instructions (Signed)
Continue taking the same medications Next visit by April 2016 for a physical exam, fasting.

## 2014-05-25 NOTE — Progress Notes (Signed)
Pre visit review using our clinic review tool, if applicable. No additional management support is needed unless otherwise documented below in the visit note. 

## 2014-05-30 ENCOUNTER — Ambulatory Visit (INDEPENDENT_AMBULATORY_CARE_PROVIDER_SITE_OTHER): Payer: Medicare Other | Admitting: Cardiology

## 2014-05-30 ENCOUNTER — Encounter: Payer: Self-pay | Admitting: Cardiology

## 2014-05-30 VITALS — BP 140/88 | HR 59 | Ht 71.0 in

## 2014-05-30 DIAGNOSIS — I10 Essential (primary) hypertension: Secondary | ICD-10-CM | POA: Diagnosis not present

## 2014-05-30 DIAGNOSIS — I2510 Atherosclerotic heart disease of native coronary artery without angina pectoris: Secondary | ICD-10-CM

## 2014-05-30 DIAGNOSIS — I5022 Chronic systolic (congestive) heart failure: Secondary | ICD-10-CM | POA: Insufficient documentation

## 2014-05-30 DIAGNOSIS — I255 Ischemic cardiomyopathy: Secondary | ICD-10-CM

## 2014-05-30 DIAGNOSIS — I779 Disorder of arteries and arterioles, unspecified: Secondary | ICD-10-CM

## 2014-05-30 DIAGNOSIS — I2589 Other forms of chronic ischemic heart disease: Secondary | ICD-10-CM

## 2014-05-30 DIAGNOSIS — I739 Peripheral vascular disease, unspecified: Secondary | ICD-10-CM

## 2014-05-30 NOTE — Patient Instructions (Signed)
**Note De-identified  Obfuscation** Your physician recommends that you continue on your current medications as directed. Please refer to the Current Medication list given to you today.  Your physician wants you to follow-up in: 1 year. You will receive a reminder letter in the mail two months in advance. If you don't receive a letter, please call our office to schedule the follow-up appointment.  

## 2014-05-30 NOTE — Progress Notes (Signed)
Patient ID: Jaime [name], male   DOB: Oct 08, 1940, 73 y.o.   MRN: [id number]    HPI  patient is seen today for follow-up of his cardiomyopathy. I saw him last in 2013. He was seen by Mr. Kathlen Mody in August, 2015. He had a follow-up echo showing that his ejection fraction had improved up to the range of 35%. He has a multitude of comorbidities. Medical therapy is recommended. He is stable. He has significant multiple sclerosis. He's had bilateral amputations. He is wheelchair bound and to his electric scooter.  Allergies  Allergen Reactions  . Bee Venom Anaphylaxis  . Influenza Vaccines Other (See Comments)    Sick for months    Current Outpatient Prescriptions  Medication Sig Dispense Refill  . acetaminophen-codeine (TYLENOL #4) 300-60 MG per tablet Take 1 tablet by mouth 3 (three) times daily. 90 tablet 1  . aspirin EC 325 MG tablet Take 325 mg by mouth daily.    Marland Kitchen atorvastatin (LIPITOR) 40 MG tablet TAKE 1 TABLET (40 MG TOTAL) BY MOUTH AT BEDTIME. 90 tablet 1  . baclofen (LIORESAL) 10 MG tablet TAKE 0.5 TABLETS (5 MG TOTAL) BY MOUTH 2 (TWO) TIMES DAILY 30 tablet 2  . benazepril (LOTENSIN) 10 MG tablet TAKE 1 TABLET BY MOUTH EVERY DAY 90 tablet 1  . diclofenac sodium (VOLTAREN) 1 % GEL Apply 1 application topically 4 (four) times daily as needed. For pain     . gabapentin (NEURONTIN) 300 MG capsule Take 2 capsules (600 mg total) by mouth 3 (three) times daily. 180 capsule 3  . glucagon (GLUCAGEN) 1 MG SOLR injection Inject 1 mg into the vein once as needed for low blood sugar.    . insulin aspart protamine- aspart (NOVOLOG MIX 70/30) (70-30) 100 UNIT/ML injection Inject 15-35 Units into the skin 2 (two) times daily with a meal. Take 35 units in the morning with breakfast and take 15 units at supper    . metoprolol succinate (TOPROL-XL) 25 MG 24 hr tablet TAKE 1.5 TABLETS (37.5 MG TOTAL) BY MOUTH DAILY. 135 tablet 1  . mupirocin ointment (BACTROBAN) 2 % Apply 1 application topically 2 (two)  times daily. 30 g 2  . pantoprazole (PROTONIX) 40 MG tablet Take 40 mg by mouth daily at 12 noon.     No current facility-administered medications for this visit.    History   Social History  . Marital Status: Married    Spouse Name: N/A    Number of Children: 2  . Years of Education: N/A   Occupational History  . disable    Social History Main Topics  . Smoking status: Former Smoker -- 80 years  . Smokeless tobacco: Former Systems developer    Quit date: 08/05/2009     Comment: pt states that he is using E-cigs  . Alcohol Use: No  . Drug Use: No  . Sexual Activity: Not on file     Comment: electronic cigarettes   Other Topics Concern  . Not on file   Social History Narrative   Lives w/ wife    Family History  Problem Relation Age of Onset  . Hypertension    . Heart attack Mother 19  . Heart disease Mother   . Stroke Mother   . Hyperlipidemia Mother   . Hypertension Mother   . Colon cancer Neg Hx   . Prostate cancer Neg Hx   . COPD Father   . Peripheral vascular disease Father   . Diabetes Brother   .  Heart disease Brother   . Hypertension Brother   . Heart attack Brother   . Diabetes Daughter     Past Medical History  Diagnosis Date  . Hyperlipidemia   . Cardiomyopathy, ischemic     EF 45% per ECHO 2008  //   EF 25%, echo, August, 2013 //  Echo (8/15):  Mild LVH, EF 30-35%, ant-lat and lat AK, inf HK, Gr 1 DD, mild MR, mild LAE  . Pulmonary hypertension     moderate ECHO Jan 2008  . Systolic heart failure   . Multiple sclerosis [place] -- LAST VISIT 11-20-2010  NOTE W/ CHART  . Lung nodule     resolved 11-2006 CT Chest  . Tobacco abuse     quit   . Increased prostate specific antigen (PSA) velocity   . Urinary retention     dx ~ 2-12, like from [place], now with a catheter, saw urology  . Carotid artery disease     a.  Doppler, February, 2012, 0-39% bilateral,Mild smooth plaque;  b.  Carotid US (8/15):  Bilateral 1-39% ICA >>> F/u 2  years  . Chronic indwelling Foley catheter   . Scoliosis associated with other condition   . Hemiparesis   . Fatigue SEVERE  . Impotence   . MI, acute, non ST segment elevation 2007    S/P PCI WITH X1 STENT (TAXUS DRUG-ELUTING) LEFT CIRCUMFLEX  . Diabetes mellitus     INSULIN-DEPENDANT  . Hypertension   . H/O pleural effusion 2008    POST THORACENTESIS  . History of colon polyps PRECANCEROUS  . Insomnia   . CAD (coronary artery disease) CARDIOLOGIST- DR KATZ-- VISIT 06-05-2011 IN [facility]    non-STEMI, 2007.Marland Kitchenoccluded circumflex.. Taxus stent placed...residual 80% LAD...50% RCA  . Urinary tract infection     hx of  . Atherosclerotic PVD with ulceration     left foot  . PAC (premature atrial contraction)     December, 2013    Past Surgical History  Procedure Laterality Date  . Hernia repair  1990    (R)  . Thoracentesis  2008    PLEURAL EFFUSION  . Cystoscopy  07/30/2011    Procedure: CYSTOSCOPY;  Surgeon: Hanley Ben, MD;  Location: [facility];  Service: Urology;  Laterality: N/A;  . Transurethral resection of prostate  07/30/2011    Procedure: TRANSURETHRAL RESECTION OF THE PROSTATE (TURP);  Surgeon: Hanley Ben, MD;  Location: [facility];  Service: Urology;  Laterality: N/A;  . Femoral-tibial bypass graft  03/11/2012    Procedure: BYPASS GRAFT FEMORAL-TIBIAL ARTERY;  Surgeon: Conrad [place], MD;  Location: [place];  Service: Vascular;  Laterality: Left;  Left Femoral -Tibial trunk bypass, Endarterectomy of Tibial- Peroneal trunk with vein angioplasty.  . Intraoperative arteriogram  03/11/2012    Procedure: INTRA OPERATIVE ARTERIOGRAM;  Surgeon: Conrad Croom, MD;  Location: [place];  Service: Vascular;  Laterality: Left;  . Femoral-popliteal bypass graft  03/11/2012    Procedure: BYPASS GRAFT FEMORAL-POPLITEAL ARTERY;  Surgeon: Conrad [place], MD;  Location: [place];  Service: Vascular;  Laterality: Left;  embolectomy left lower leg  . Amputation  03/17/2012     Procedure: AMPUTATION ABOVE KNEE;  Surgeon: Conrad [place], MD;  Location: [place];  Service: Vascular;  Laterality: Left;  . Coronary angioplasty with stent placement  05-01-2006    OCCLUDED CIRCUMFLEX -- TAXUS STENT PLACMENT  AND RESIDUAL 80% LAD,  50% RCA  .  Amputation  06/03/2012    Procedure: AMPUTATION ABOVE KNEE;  Surgeon: Conrad [place], MD;  Location: [place];  Service: Vascular;  Laterality: Right;    Patient Active Problem List   Diagnosis Date Noted  . Chronic systolic CHF (congestive heart failure) 05/30/2014  . Scoliosis, thoracogenic, acquired 03/15/2014  . Mild cognitive impairment 02/11/2014  . PVD (peripheral vascular disease) 08/13/2013  . Pain in limb 08/13/2013  . Annual physical exam 05/12/2013  . S/P AKA (above knee amputation) bilateral 08/03/2012  . Cough 07/21/2012  . PAC (premature atrial contraction)   . Peripheral vascular disease, unspecified 07/03/2012  . Phantom pain 06/24/2012  . 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 Preop cardiovascular exam   . Atherosclerosis of native arteries of the extremities with ulceration(440.23) 04/17/2012  . Ejection fraction   . Cardiomyopathy, ischemic   . Pulmonary hypertension   . Urinary retention   . Hemiparesis   . Hypertension   . S/P AKA (above knee amputation)   . Urinary tract infection after immobility, UCX MRSA 03/26/2012  . Atherosclerotic PVD with ulceration 03/06/2012  . Other disorder of muscle, ligament, and fascia 12/13/2011  . CAD (coronary artery disease)   . Carotid artery disease   . SHOULDER PAIN 08/24/2010  . HYPERLIPIDEMIA 07/31/2007  . BPH (benign prostatic hyperplasia) 04/15/2007  . DM 12/18/2006  . Multiple sclerosis 12/18/2006    ROS   patient denies fever, chills, headache, sweats, rash, change in vision, change in hearing, chest pain, cough, nausea or vomiting, urinary symptoms. All other systems are reviewed and are negative.  PHYSICAL EXAM  patient is in  his electric scooter. He is here with his wife. He is oriented to person time and place. Affect is normal. He has a very good attitude about his multiple medical problems. Head is atraumatic. Sclera and conjunctiva are normal. He has deformities related to his neurologic abnormalities. He has bilateral amputations. Lungs reveal decreased breath sounds. There is no jugular venous distention. There is no respiratory distress. Cardiac exam reveals an S1 and S2. The abdomen is soft.  Filed Vitals:   05/30/14 1101  BP: 140/88  Pulse: 59  Height: 5\' 11"  (1.803 m)  SpO2: 95%    EKG  ASSESSMENT & PLAN

## 2014-05-30 NOTE — Assessment & Plan Note (Signed)
Patient is on appropriate medications for his left ventricle. Due to his multiple other medical problems he is not a candidate for ICD.

## 2014-05-30 NOTE — Assessment & Plan Note (Signed)
Patient's coronary status is stable. He does not need any further testing at this time.

## 2014-05-30 NOTE — Assessment & Plan Note (Signed)
Blood pressures controlled. No change in therapy. 

## 2014-05-30 NOTE — Assessment & Plan Note (Signed)
His carotids are being followed carefully. There is stable.

## 2014-05-30 NOTE — Assessment & Plan Note (Signed)
Volume status is stable. No change in therapy. 

## 2014-05-31 ENCOUNTER — Encounter: Payer: Self-pay | Admitting: Physical Medicine & Rehabilitation

## 2014-05-31 ENCOUNTER — Encounter: Payer: Medicare Other | Attending: Physical Medicine & Rehabilitation

## 2014-05-31 ENCOUNTER — Ambulatory Visit (HOSPITAL_BASED_OUTPATIENT_CLINIC_OR_DEPARTMENT_OTHER): Payer: Medicare Other | Admitting: Physical Medicine & Rehabilitation

## 2014-05-31 VITALS — HR 88 | Resp 14

## 2014-05-31 DIAGNOSIS — Z794 Long term (current) use of insulin: Secondary | ICD-10-CM | POA: Diagnosis not present

## 2014-05-31 DIAGNOSIS — G894 Chronic pain syndrome: Secondary | ICD-10-CM

## 2014-05-31 DIAGNOSIS — G8220 Paraplegia, unspecified: Secondary | ICD-10-CM

## 2014-05-31 DIAGNOSIS — Z09 Encounter for follow-up examination after completed treatment for conditions other than malignant neoplasm: Secondary | ICD-10-CM | POA: Insufficient documentation

## 2014-05-31 DIAGNOSIS — E119 Type 2 diabetes mellitus without complications: Secondary | ICD-10-CM | POA: Diagnosis not present

## 2014-05-31 DIAGNOSIS — Z89611 Acquired absence of right leg above knee: Secondary | ICD-10-CM | POA: Insufficient documentation

## 2014-05-31 DIAGNOSIS — Z87891 Personal history of nicotine dependence: Secondary | ICD-10-CM | POA: Diagnosis not present

## 2014-05-31 DIAGNOSIS — Z79899 Other long term (current) drug therapy: Secondary | ICD-10-CM

## 2014-05-31 DIAGNOSIS — M419 Scoliosis, unspecified: Secondary | ICD-10-CM | POA: Diagnosis not present

## 2014-05-31 DIAGNOSIS — G35 Multiple sclerosis: Secondary | ICD-10-CM | POA: Insufficient documentation

## 2014-05-31 DIAGNOSIS — I252 Old myocardial infarction: Secondary | ICD-10-CM | POA: Diagnosis not present

## 2014-05-31 DIAGNOSIS — I10 Essential (primary) hypertension: Secondary | ICD-10-CM | POA: Insufficient documentation

## 2014-05-31 DIAGNOSIS — Z89612 Acquired absence of left leg above knee: Secondary | ICD-10-CM

## 2014-05-31 DIAGNOSIS — E785 Hyperlipidemia, unspecified: Secondary | ICD-10-CM | POA: Insufficient documentation

## 2014-05-31 DIAGNOSIS — Z5181 Encounter for therapeutic drug level monitoring: Secondary | ICD-10-CM

## 2014-05-31 DIAGNOSIS — Z955 Presence of coronary angioplasty implant and graft: Secondary | ICD-10-CM | POA: Insufficient documentation

## 2014-05-31 DIAGNOSIS — I2510 Atherosclerotic heart disease of native coronary artery without angina pectoris: Secondary | ICD-10-CM | POA: Diagnosis not present

## 2014-05-31 DIAGNOSIS — I2589 Other forms of chronic ischemic heart disease: Secondary | ICD-10-CM | POA: Diagnosis not present

## 2014-05-31 MED ORDER — BACLOFEN 10 MG PO TABS: ORAL_TABLET | ORAL | Status: DC

## 2014-05-31 MED ORDER — ACETAMINOPHEN-CODEINE #4 300-60 MG PO TABS
1.0000 | ORAL_TABLET | Freq: Three times a day (TID) | ORAL | Status: DC
Start: 2014-05-31 — End: 2014-08-26

## 2014-05-31 NOTE — Progress Notes (Signed)
Subjective:    Patient ID: Jaime [name], male    DOB: 03-02-1941, 73 y.o.   MRN: [id number]  HPI Patient still forgets that he has had bilateral above-knee amputations. No falls. Power wheelchair. Lives with wife who does most of his ADLs for him. Follows up with neurology for his multiple sclerosis. No recent exacerbations. Pain Inventory Average Pain 5 Pain Right Now 5 My pain is intermittent, dull and aching  In the last 24 hours, has pain interfered with the following? General activity 4 Relation with others 4 Enjoyment of life 4 What TIME of day is your pain at its worst? limb pain Sleep (in general) Fair  Pain is worse with: some activites Pain improves with: rest and medication Relief from Meds: 5  Mobility ability to climb steps?  no do you drive?  no use a wheelchair  Function retired I need assistance with the following:  bathing, meal prep and household duties  Neuro/Psych bladder control problems spasms  Prior Studies Any changes since last visit?  no  Physicians involved in your care Any changes since last visit?  no   Family History  Problem Relation Age of Onset  . Hypertension    . Heart attack Mother 45  . Heart disease Mother   . Stroke Mother   . Hyperlipidemia Mother   . Hypertension Mother   . Colon cancer Neg Hx   . Prostate cancer Neg Hx   . COPD Father   . Peripheral vascular disease Father   . Diabetes Brother   . Heart disease Brother   . Hypertension Brother   . Heart attack Brother   . Diabetes Daughter    History   Social History  . Marital Status: Married    Spouse Name: N/A    Number of Children: 2  . Years of Education: N/A   Occupational History  . disable    Social History Main Topics  . Smoking status: Former Smoker -- 63 years  . Smokeless tobacco: Former Systems developer    Quit date: 08/05/2009     Comment: pt states that he is using E-cigs  . Alcohol Use: No  . Drug Use: No  . Sexual Activity: None   Comment: electronic cigarettes   Other Topics Concern  . None   Social History Narrative   Lives w/ wife   Past Surgical History  Procedure Laterality Date  . Hernia repair  1990    (R)  . Thoracentesis  2008    PLEURAL EFFUSION  . Cystoscopy  07/30/2011    Procedure: CYSTOSCOPY;  Surgeon: Hanley Ben, MD;  Location: [facility];  Service: Urology;  Laterality: N/A;  . Transurethral resection of prostate  07/30/2011    Procedure: TRANSURETHRAL RESECTION OF THE PROSTATE (TURP);  Surgeon: Hanley Ben, MD;  Location: [facility];  Service: Urology;  Laterality: N/A;  . Femoral-tibial bypass graft  03/11/2012    Procedure: BYPASS GRAFT FEMORAL-TIBIAL ARTERY;  Surgeon: Conrad Appanoose, MD;  Location: [facility];  Service: Vascular;  Laterality: Left;  Left Femoral -Tibial trunk bypass, Endarterectomy of Tibial- Peroneal trunk with vein angioplasty.  . Intraoperative arteriogram  03/11/2012    Procedure: INTRA OPERATIVE ARTERIOGRAM;  Surgeon: Conrad Wyandot, MD;  Location: [place];  Service: Vascular;  Laterality: Left;  . Femoral-popliteal bypass graft  03/11/2012    Procedure: BYPASS GRAFT FEMORAL-POPLITEAL ARTERY;  Surgeon: Conrad Gisela, MD;  Location: [facility];  Service: Vascular;  Laterality: Left;  embolectomy left lower leg  . Amputation  03/17/2012    Procedure: AMPUTATION ABOVE KNEE;  Surgeon: Conrad Northrop, MD;  Location: [place];  Service: Vascular;  Laterality: Left;  . Coronary angioplasty with stent placement  05-01-2006    OCCLUDED CIRCUMFLEX -- TAXUS STENT PLACMENT  AND RESIDUAL 80% LAD,  50% RCA  . Amputation  06/03/2012    Procedure: AMPUTATION ABOVE KNEE;  Surgeon: Conrad Tekamah, MD;  Location: [facility];  Service: Vascular;  Laterality: Right;   Past Medical History  Diagnosis Date  . Hyperlipidemia   . Cardiomyopathy, ischemic     EF 45% per ECHO 2008  //   EF 25%, echo, August, 2013 //  Echo (8/15):  Mild LVH, EF 30-35%, ant-lat and lat AK, inf HK, Gr 1 DD,  mild MR, mild LAE  . Pulmonary hypertension     moderate ECHO Jan 2008  . Systolic heart failure   . Multiple sclerosis [place] -- LAST VISIT 11-20-2010  NOTE W/ CHART  . Lung nodule     resolved 11-2006 CT Chest  . Tobacco abuse     quit   . Increased prostate specific antigen (PSA) velocity   . Urinary retention     dx ~ 2-12, like from [place], now with a catheter, saw urology  . Carotid artery disease     a.  Doppler, February, 2012, 0-39% bilateral,Mild smooth plaque;  b.  Carotid US (8/15):  Bilateral 1-39% ICA >>> F/u 2 years  . Chronic indwelling Foley catheter   . Scoliosis associated with other condition   . Hemiparesis   . Fatigue SEVERE  . Impotence   . MI, acute, non ST segment elevation 2007    S/P PCI WITH X1 STENT (TAXUS DRUG-ELUTING) LEFT CIRCUMFLEX  . Diabetes mellitus     INSULIN-DEPENDANT  . Hypertension   . H/O pleural effusion 2008    POST THORACENTESIS  . History of colon polyps PRECANCEROUS  . Insomnia   . CAD (coronary artery disease) CARDIOLOGIST- DR KATZ-- VISIT 06-05-2011 IN [facility]    non-STEMI, 2007.Marland Kitchenoccluded circumflex.. Taxus stent placed...residual 80% LAD...50% RCA  . Urinary tract infection     hx of  . Atherosclerotic PVD with ulceration     left foot  . PAC (premature atrial contraction)     December, 2013   Pulse 88  Resp 14  SpO2 97%  Opioid Risk Score:   Fall Risk Score: Low Fall Risk (0-5 points)  Review of Systems  Constitutional: Negative.   HENT: Negative.   Eyes: Negative.   Respiratory: Negative.   Cardiovascular: Negative.   Gastrointestinal: Negative.   Endocrine: Negative.   Genitourinary: Negative.   Musculoskeletal: Positive for myalgias.       Limb pain  Skin: Negative.   Allergic/Immunologic: Negative.   Neurological: Negative.   Hematological: Negative.   Psychiatric/Behavioral: Negative.        Objective:   Physical Exam  Constitutional: He appears well-developed and  well-nourished.  HENT:  Head: Normocephalic and atraumatic.  Eyes: Conjunctivae and EOM are normal. Pupils are equal, round, and reactive to light.  Neck: Normal range of motion.  Neurological: He is alert.  Motor strength is 4/5 bilateral biceps triceps and grip 3 minus limited by range of motion right deltoid and 4 at the left deltoid  Psychiatric: He has a normal mood and affect.  Nursing note and vitals reviewed.   AKA bilateral non tender stumps  Motor 2- in HF      Assessment & Plan:  1.  MS with  Paraplegia cont baclofen for spasticity, f/u neuro  2.  Bilateral AKA, not prostehetic candidate due to MS and Scoliosis  3.  Thoracolumbar scoliosis, modified seating on WC  4. R shoulder pain resolved after subacromial injection in May, reinject if recurrent

## 2014-05-31 NOTE — Patient Instructions (Signed)
Cont current Rx 

## 2014-06-10 ENCOUNTER — Ambulatory Visit (INDEPENDENT_AMBULATORY_CARE_PROVIDER_SITE_OTHER): Payer: Medicare Other | Admitting: Neurology

## 2014-06-10 ENCOUNTER — Encounter: Payer: Self-pay | Admitting: Neurology

## 2014-06-10 VITALS — BP 140/78 | HR 86 | Temp 98.2°F | Resp 18

## 2014-06-10 DIAGNOSIS — G35 Multiple sclerosis: Secondary | ICD-10-CM

## 2014-06-10 DIAGNOSIS — G546 Phantom limb syndrome with pain: Secondary | ICD-10-CM

## 2014-06-10 DIAGNOSIS — G3184 Mild cognitive impairment, so stated: Secondary | ICD-10-CM | POA: Diagnosis not present

## 2014-06-10 DIAGNOSIS — I2589 Other forms of chronic ischemic heart disease: Secondary | ICD-10-CM

## 2014-06-10 NOTE — Progress Notes (Signed)
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  He is accompanied by his wife.  UPDATE: For pain, he takes gabapentin 600mg  three times daily and baclofen.  He has been feeling well.  HISTORY: He was diagnosed with MS in 1993.  Initially, he developed right arm and leg weakness along with balance problems.  Symptoms initially improved but had progressed over the years.  He was on multiple immunomodulating agents such as Betaseron, Copaxone, Cytoxin and monthly Solumedrol.  He currently is not on an agent and hasn't had a flare up in 4 years.  He has bilateral ATK ambutations performed in 2013 as a consequence of his peripheral vascular and diabetes.  He requires assistance with bathing and dressing, but he is able to feed himself.  He uses a power wheelchair.  He had been on amantadine for many years.  MRI Brain w/wo from 11/29/04 demonstrated "extensive white matter disease in the supratentorial compartment, which is consistent with advanced multiple sclerosis.  There is confluence of the white matter lesions with atrophy and hydrocephalus, thinned corpus callosum, and black holes, however somewhat unusual for MS of this degree, no infrantentorial lesions are seen and no active lesions are identified by means of contrast." CT of head was performed on 03/23/12 for encephalopathy, which revealed atrophy and nonspecific small vessel ischemic changes.  He has [place].  More recently, he had been experiencing vivid nightmares.  It was believed to be due to low blood sugars or codeine.  On 11/01/13, he presented to the ED with severe right phantom limb pain, described as muscle spasms like a Charley horse, that was intermittent and lasting 10 minutes at a  time.  Labs were unremarkable, except for WBCs in the urine, however he was recently treated for UTI.  He was given Percocet and Valium, which helped.  Tylenol 3 and 4 do not help.  He currently is taking Percocet which helps.  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  He has some poor short-term memory. Sometimes he calls his wife by his mother's name.  He cannot always remember the names of his great grandchildren.  He has more difficulty figuring out how to maneuver the motorized wheelchair.  Medications include:  Tylenol #4, baclofen, gabapentin (occasionally).  Amantadine was discontinued due to nightmares.  PAST MEDICAL HISTORY: Past Medical History  Diagnosis Date  . Hyperlipidemia   . Cardiomyopathy, ischemic     EF 45% per ECHO 2008  //   EF 25%, echo, August, 2013 //  Echo (8/15):  Mild LVH, EF 30-35%, ant-lat and lat AK, inf HK, Gr 1 DD, mild MR, mild LAE  . Pulmonary hypertension     moderate ECHO Jan 2008  . Systolic heart failure   . Multiple sclerosis [place] -- LAST VISIT 11-20-2010  NOTE W/ CHART  . Lung nodule     resolved 11-2006 CT Chest  . Tobacco abuse     quit   . Increased prostate specific antigen (PSA) velocity   . Urinary retention     dx ~ 2-12, like from [place], now with a catheter, saw urology  . Carotid artery disease     a.  Doppler, February, 2012, 0-39% bilateral,Mild smooth plaque;  b.  Carotid US (8/15):  Bilateral 1-39% ICA >>> F/u 2 years  . Chronic indwelling Foley catheter   . Scoliosis associated with other condition   . Hemiparesis   . Fatigue SEVERE  . Impotence   . MI, acute, non ST segment elevation 2007    S/P PCI WITH X1 STENT (TAXUS DRUG-ELUTING) LEFT CIRCUMFLEX  . Diabetes mellitus     INSULIN-DEPENDANT  . Hypertension   . H/O pleural effusion 2008    POST THORACENTESIS  . History of colon polyps PRECANCEROUS  . Insomnia   . CAD (coronary artery disease) CARDIOLOGIST- DR KATZ-- VISIT 06-05-2011 IN  [facility]    non-STEMI, 2007.Marland Kitchenoccluded circumflex.. Taxus stent placed...residual 80% LAD...50% RCA  . Urinary tract infection     hx of  . Atherosclerotic PVD with ulceration     left foot  . PAC (premature atrial contraction)     December, 2013    MEDICATIONS: Current Outpatient Prescriptions on File Prior to Visit  Medication Sig Dispense Refill  . acetaminophen-codeine (TYLENOL #4) 300-60 MG per tablet Take 1 tablet by mouth 3 (three) times daily. 90 tablet 2  . aspirin EC 325 MG tablet Take 325 mg by mouth daily.    Marland Kitchen atorvastatin (LIPITOR) 40 MG tablet TAKE 1 TABLET (40 MG TOTAL) BY MOUTH AT BEDTIME. 90 tablet 1  . baclofen (LIORESAL) 10 MG tablet TAKE 0.5 TABLETS (5 MG TOTAL) BY MOUTH 2 (TWO) TIMES DAILY 30 tablet 2  . benazepril (LOTENSIN) 10 MG tablet TAKE 1 TABLET BY MOUTH EVERY DAY 90 tablet 1  . diclofenac sodium (VOLTAREN) 1 % GEL Apply 1 application topically 4 (four) times daily as needed. For pain     . gabapentin (NEURONTIN) 300 MG capsule Take 2 capsules (600 mg total) by mouth 3 (three) times daily. 180 capsule 3  . glucagon (GLUCAGEN) 1 MG SOLR injection Inject 1 mg into the vein once as needed for low blood sugar.    . insulin aspart protamine- aspart (NOVOLOG MIX 70/30) (70-30) 100 UNIT/ML injection Inject 15-35 Units into the skin 2 (two) times daily with a meal. Take 35 units in the morning with breakfast and take 15 units at supper    . metoprolol succinate (TOPROL-XL) 25 MG 24 hr tablet TAKE 1.5 TABLETS (37.5 MG TOTAL) BY MOUTH DAILY. 135 tablet 1  . mupirocin ointment (BACTROBAN) 2 % Apply 1 application topically 2 (two) times daily. 30 g 2  . pantoprazole (PROTONIX) 40 MG tablet Take 40 mg by mouth daily at 12 noon.     No current facility-administered medications on file prior to visit.    ALLERGIES: Allergies  Allergen Reactions  . Bee Venom Anaphylaxis  . Influenza Vaccines Other (See Comments)    Sick for months    FAMILY HISTORY: Family History    Problem Relation Age of Onset  . Hypertension    . Heart attack Mother 18  . Heart disease Mother   . Stroke Mother   . Hyperlipidemia Mother   . Hypertension Mother   . Colon cancer Neg Hx   . Prostate cancer Neg Hx   . COPD Father   . Peripheral vascular disease Father   . Diabetes Brother   . Heart disease Brother   . Hypertension Brother   . Heart attack Brother   . Diabetes Daughter     SOCIAL HISTORY: History   Social History  . Marital Status: Married    Spouse Name: N/A    Number of  Children: 2  . Years of Education: N/A   Occupational History  . disable    Social History Main Topics  . Smoking status: Former Smoker -- 46 years  . Smokeless tobacco: Former Systems developer    Quit date: 08/05/2009     Comment: pt states that he is using E-cigs  . Alcohol Use: No  . Drug Use: No  . Sexual Activity: No     Comment: electronic cigarettes   Other Topics Concern  . Not on file   Social History Narrative   Lives w/ wife    REVIEW OF SYSTEMS: Constitutional: No fevers, chills, or sweats, no generalized fatigue, change in appetite Eyes: No visual changes, double vision, eye pain Ear, nose and throat: No hearing loss, ear pain, nasal congestion, sore throat Cardiovascular: No chest pain, palpitations Respiratory:  No shortness of breath at rest or with exertion, wheezes GastrointestinaI: No nausea, vomiting, diarrhea, abdominal pain, fecal incontinence Genitourinary:  No dysuria, urinary retention or frequency Musculoskeletal:  No neck pain, back pain Integumentary: No rash, pruritus, skin lesions Neurological: as above Psychiatric: No depression, insomnia, anxiety Endocrine: No palpitations, fatigue, diaphoresis, mood swings, change in appetite, change in weight, increased thirst Hematologic/Lymphatic:  No anemia, purpura, petechiae. Allergic/Immunologic: no itchy/runny eyes, nasal congestion, recent allergic reactions, rashes  PHYSICAL EXAM: Filed Vitals:    06/10/14 1419  BP: 140/78  Pulse: 86  Temp: 98.2 F (36.8 C)  Resp: 18   General: No acute distress Head:  Normocephalic/atraumatic Eyes:  Fundi not able to be visualized on inspection Neck: supple, no paraspinal tenderness, full range of motion Heart:  Regular rate and rhythm Lungs:  Clear to auscultation bilaterally Back: No paraspinal tenderness Neurological Exam: alert and oriented to person, place, and time. Attention span and concentration intact, recent and remote memory intact, fund of knowledge intact.  Speech fluent and not dysarthric, language intact.  CN II-XII intact. Fundi not able to be visualized on inspection.  Bulk and tone normal, muscle strength 5/5 in upper extremities.  Sensation to light touch, temperature and vibration intact.  Deep tendon reflexes 2+ in upper extremities.  Finger to nose intact.    IMPRESSION: Secondary progressive MS, clinically stable Phantom limb pain, controlled  PLAN: 1.  Gabapentin and baclofen 2.  Follow up in 6 months  15 minutes spent with patient, over 50% spent counseling and coordinating care.  Metta Clines, DO  CC:  [name], MD

## 2014-06-10 NOTE — Patient Instructions (Signed)
Continue baclofen and gabapentin. Follow up in 6 months Happy Holidays!

## 2014-06-30 ENCOUNTER — Encounter (HOSPITAL_COMMUNITY): Payer: Self-pay | Admitting: Surgery

## 2014-07-11 DIAGNOSIS — N390 Urinary tract infection, site not specified: Secondary | ICD-10-CM | POA: Diagnosis not present

## 2014-07-11 DIAGNOSIS — R81 Glycosuria: Secondary | ICD-10-CM | POA: Diagnosis not present

## 2014-07-19 DIAGNOSIS — E780 Pure hypercholesterolemia: Secondary | ICD-10-CM | POA: Diagnosis not present

## 2014-07-19 DIAGNOSIS — I10 Essential (primary) hypertension: Secondary | ICD-10-CM | POA: Diagnosis not present

## 2014-07-19 DIAGNOSIS — E1165 Type 2 diabetes mellitus with hyperglycemia: Secondary | ICD-10-CM | POA: Diagnosis not present

## 2014-07-24 ENCOUNTER — Other Ambulatory Visit: Payer: Self-pay | Admitting: Internal Medicine

## 2014-08-18 ENCOUNTER — Inpatient Hospital Stay (HOSPITAL_COMMUNITY)
Admission: EM | Admit: 2014-08-18 | Discharge: 2014-08-26 | DRG: 853 | Disposition: A | Discharge status: Other Acute Inpatient Hospital | Payer: Medicare Other | Attending: Internal Medicine | Admitting: Internal Medicine

## 2014-08-18 ENCOUNTER — Encounter (HOSPITAL_COMMUNITY): Payer: Self-pay | Admitting: *Deleted

## 2014-08-18 ENCOUNTER — Emergency Department (HOSPITAL_COMMUNITY): Payer: Medicare Other

## 2014-08-18 DIAGNOSIS — N281 Cyst of kidney, acquired: Secondary | ICD-10-CM | POA: Diagnosis not present

## 2014-08-18 DIAGNOSIS — E119 Type 2 diabetes mellitus without complications: Secondary | ICD-10-CM

## 2014-08-18 DIAGNOSIS — R1011 Right upper quadrant pain: Secondary | ICD-10-CM | POA: Insufficient documentation

## 2014-08-18 DIAGNOSIS — K838 Other specified diseases of biliary tract: Secondary | ICD-10-CM | POA: Diagnosis not present

## 2014-08-18 DIAGNOSIS — R0600 Dyspnea, unspecified: Secondary | ICD-10-CM | POA: Insufficient documentation

## 2014-08-18 DIAGNOSIS — K810 Acute cholecystitis: Secondary | ICD-10-CM | POA: Diagnosis not present

## 2014-08-18 DIAGNOSIS — D509 Iron deficiency anemia, unspecified: Secondary | ICD-10-CM | POA: Diagnosis present

## 2014-08-18 DIAGNOSIS — Z794 Long term (current) use of insulin: Secondary | ICD-10-CM | POA: Diagnosis not present

## 2014-08-18 DIAGNOSIS — R7989 Other specified abnormal findings of blood chemistry: Secondary | ICD-10-CM

## 2014-08-18 DIAGNOSIS — R072 Precordial pain: Secondary | ICD-10-CM | POA: Diagnosis not present

## 2014-08-18 DIAGNOSIS — A419 Sepsis, unspecified organism: Secondary | ICD-10-CM | POA: Diagnosis present

## 2014-08-18 DIAGNOSIS — K8020 Calculus of gallbladder without cholecystitis without obstruction: Secondary | ICD-10-CM | POA: Diagnosis not present

## 2014-08-18 DIAGNOSIS — I10 Essential (primary) hypertension: Secondary | ICD-10-CM | POA: Diagnosis present

## 2014-08-18 DIAGNOSIS — Z87891 Personal history of nicotine dependence: Secondary | ICD-10-CM

## 2014-08-18 DIAGNOSIS — I252 Old myocardial infarction: Secondary | ICD-10-CM | POA: Diagnosis not present

## 2014-08-18 DIAGNOSIS — E1165 Type 2 diabetes mellitus with hyperglycemia: Secondary | ICD-10-CM | POA: Diagnosis present

## 2014-08-18 DIAGNOSIS — R778 Other specified abnormalities of plasma proteins: Secondary | ICD-10-CM | POA: Diagnosis present

## 2014-08-18 DIAGNOSIS — K819 Cholecystitis, unspecified: Secondary | ICD-10-CM | POA: Diagnosis not present

## 2014-08-18 DIAGNOSIS — Z887 Allergy status to serum and vaccine status: Secondary | ICD-10-CM

## 2014-08-18 DIAGNOSIS — R6521 Severe sepsis with septic shock: Secondary | ICD-10-CM

## 2014-08-18 DIAGNOSIS — Z89611 Acquired absence of right leg above knee: Secondary | ICD-10-CM

## 2014-08-18 DIAGNOSIS — R0602 Shortness of breath: Secondary | ICD-10-CM

## 2014-08-18 DIAGNOSIS — Z89612 Acquired absence of left leg above knee: Secondary | ICD-10-CM | POA: Diagnosis not present

## 2014-08-18 DIAGNOSIS — E872 Acidosis, unspecified: Secondary | ICD-10-CM

## 2014-08-18 DIAGNOSIS — K8063 Calculus of gallbladder and bile duct with acute cholecystitis with obstruction: Secondary | ICD-10-CM | POA: Diagnosis present

## 2014-08-18 DIAGNOSIS — Z91030 Bee allergy status: Secondary | ICD-10-CM | POA: Diagnosis not present

## 2014-08-18 DIAGNOSIS — IMO0001 Reserved for inherently not codable concepts without codable children: Secondary | ICD-10-CM

## 2014-08-18 DIAGNOSIS — K830 Cholangitis: Secondary | ICD-10-CM | POA: Diagnosis present

## 2014-08-18 DIAGNOSIS — Z7982 Long term (current) use of aspirin: Secondary | ICD-10-CM | POA: Diagnosis not present

## 2014-08-18 DIAGNOSIS — G35 Multiple sclerosis: Secondary | ICD-10-CM | POA: Diagnosis present

## 2014-08-18 DIAGNOSIS — K8040 Calculus of bile duct with cholecystitis, unspecified, without obstruction: Secondary | ICD-10-CM | POA: Diagnosis not present

## 2014-08-18 DIAGNOSIS — I272 Other secondary pulmonary hypertension: Secondary | ICD-10-CM | POA: Diagnosis present

## 2014-08-18 DIAGNOSIS — I5023 Acute on chronic systolic (congestive) heart failure: Secondary | ICD-10-CM | POA: Diagnosis present

## 2014-08-18 DIAGNOSIS — N390 Urinary tract infection, site not specified: Secondary | ICD-10-CM | POA: Diagnosis present

## 2014-08-18 DIAGNOSIS — K760 Fatty (change of) liver, not elsewhere classified: Secondary | ICD-10-CM | POA: Diagnosis not present

## 2014-08-18 DIAGNOSIS — I5022 Chronic systolic (congestive) heart failure: Secondary | ICD-10-CM | POA: Diagnosis present

## 2014-08-18 DIAGNOSIS — I270 Primary pulmonary hypertension: Secondary | ICD-10-CM | POA: Diagnosis not present

## 2014-08-18 DIAGNOSIS — T83511A Infection and inflammatory reaction due to indwelling urethral catheter, initial encounter: Secondary | ICD-10-CM

## 2014-08-18 DIAGNOSIS — Z79899 Other long term (current) drug therapy: Secondary | ICD-10-CM | POA: Diagnosis not present

## 2014-08-18 DIAGNOSIS — I255 Ischemic cardiomyopathy: Secondary | ICD-10-CM | POA: Diagnosis not present

## 2014-08-18 DIAGNOSIS — I700 Atherosclerosis of aorta: Secondary | ICD-10-CM | POA: Diagnosis not present

## 2014-08-18 DIAGNOSIS — B964 Proteus (mirabilis) (morganii) as the cause of diseases classified elsewhere: Secondary | ICD-10-CM | POA: Diagnosis present

## 2014-08-18 DIAGNOSIS — K8050 Calculus of bile duct without cholangitis or cholecystitis without obstruction: Secondary | ICD-10-CM | POA: Diagnosis not present

## 2014-08-18 DIAGNOSIS — I2510 Atherosclerotic heart disease of native coronary artery without angina pectoris: Secondary | ICD-10-CM | POA: Diagnosis not present

## 2014-08-18 DIAGNOSIS — K8042 Calculus of bile duct with acute cholecystitis without obstruction: Secondary | ICD-10-CM

## 2014-08-18 DIAGNOSIS — E785 Hyperlipidemia, unspecified: Secondary | ICD-10-CM | POA: Diagnosis present

## 2014-08-18 DIAGNOSIS — E860 Dehydration: Secondary | ICD-10-CM | POA: Diagnosis present

## 2014-08-18 DIAGNOSIS — G3184 Mild cognitive impairment, so stated: Secondary | ICD-10-CM | POA: Diagnosis present

## 2014-08-18 DIAGNOSIS — D1809 Hemangioma of other sites: Secondary | ICD-10-CM | POA: Diagnosis not present

## 2014-08-18 DIAGNOSIS — R945 Abnormal results of liver function studies: Secondary | ICD-10-CM | POA: Insufficient documentation

## 2014-08-18 DIAGNOSIS — I517 Cardiomegaly: Secondary | ICD-10-CM | POA: Diagnosis not present

## 2014-08-18 DIAGNOSIS — K429 Umbilical hernia without obstruction or gangrene: Secondary | ICD-10-CM | POA: Diagnosis not present

## 2014-08-18 DIAGNOSIS — Z993 Dependence on wheelchair: Secondary | ICD-10-CM | POA: Diagnosis not present

## 2014-08-18 LAB — CBC WITH DIFFERENTIAL/PLATELET
BASOS ABS: 0 10*3/uL (ref 0.0–0.1)
BASOS PCT: 0 % (ref 0–1)
EOS PCT: 0 % (ref 0–5)
Eosinophils Absolute: 0 10*3/uL (ref 0.0–0.7)
HCT: 47.4 % (ref 39.0–52.0)
HEMOGLOBIN: 16.1 g/dL (ref 13.0–17.0)
Lymphocytes Relative: 6 % — ABNORMAL LOW (ref 12–46)
Lymphs Abs: 1.2 10*3/uL (ref 0.7–4.0)
MCH: 26.8 pg (ref 26.0–34.0)
MCHC: 34 g/dL (ref 30.0–36.0)
MCV: 79 fL (ref 78.0–100.0)
MONO ABS: 1.4 10*3/uL — AB (ref 0.1–1.0)
Monocytes Relative: 7 % (ref 3–12)
Neutro Abs: 17.3 10*3/uL — ABNORMAL HIGH (ref 1.7–7.7)
Neutrophils Relative %: 87 % — ABNORMAL HIGH (ref 43–77)
Platelets: 256 10*3/uL (ref 150–400)
RBC: 6 MIL/uL — AB (ref 4.22–5.81)
RDW: 16.7 % — AB (ref 11.5–15.5)
WBC: 19.9 10*3/uL — ABNORMAL HIGH (ref 4.0–10.5)

## 2014-08-18 LAB — URINALYSIS, ROUTINE W REFLEX MICROSCOPIC
Glucose, UA: NEGATIVE mg/dL
Ketones, ur: 40 mg/dL — AB
Nitrite: POSITIVE — AB
PH: 6 (ref 5.0–8.0)
Protein, ur: >300 mg/dL — AB
SPECIFIC GRAVITY, URINE: 1.024 (ref 1.005–1.030)
Urobilinogen, UA: 1 mg/dL (ref 0.0–1.0)

## 2014-08-18 LAB — I-STAT CHEM 8, ED
BUN: 26 mg/dL — AB (ref 6–23)
CALCIUM ION: 1.06 mmol/L — AB (ref 1.13–1.30)
Chloride: 103 mmol/L (ref 96–112)
Creatinine, Ser: 1.1 mg/dL (ref 0.50–1.35)
GLUCOSE: 318 mg/dL — AB (ref 70–99)
HCT: 55 % — ABNORMAL HIGH (ref 39.0–52.0)
HEMOGLOBIN: 18.7 g/dL — AB (ref 13.0–17.0)
POTASSIUM: 4.3 mmol/L (ref 3.5–5.1)
Sodium: 135 mmol/L (ref 135–145)
TCO2: 17 mmol/L (ref 0–100)

## 2014-08-18 LAB — COMPREHENSIVE METABOLIC PANEL
ALBUMIN: 3.4 g/dL — AB (ref 3.5–5.2)
ALT: 44 U/L (ref 0–53)
AST: 34 U/L (ref 0–37)
Alkaline Phosphatase: 146 U/L — ABNORMAL HIGH (ref 39–117)
Anion gap: 15 (ref 5–15)
BUN: 21 mg/dL (ref 6–23)
CO2: 18 mmol/L — ABNORMAL LOW (ref 19–32)
CREATININE: 1.25 mg/dL (ref 0.50–1.35)
Calcium: 9.2 mg/dL (ref 8.4–10.5)
Chloride: 102 mmol/L (ref 96–112)
GFR, EST AFRICAN AMERICAN: 65 mL/min — AB (ref >90)
GFR, EST NON AFRICAN AMERICAN: 56 mL/min — AB (ref >90)
GLUCOSE: 311 mg/dL — AB (ref 70–99)
POTASSIUM: 4.3 mmol/L (ref 3.5–5.1)
Sodium: 135 mmol/L (ref 135–145)
Total Bilirubin: 1.4 mg/dL — ABNORMAL HIGH (ref 0.3–1.2)
Total Protein: 8.1 g/dL (ref 6.0–8.3)

## 2014-08-18 LAB — URINE MICROSCOPIC-ADD ON

## 2014-08-18 LAB — I-STAT CG4 LACTIC ACID, ED
Lactic Acid, Venous: 3.62 mmol/L (ref 0.5–2.0)
Lactic Acid, Venous: 4.7 mmol/L (ref 0.5–2.0)

## 2014-08-18 LAB — TROPONIN I
Troponin I: 0.07 ng/mL — ABNORMAL HIGH (ref <0.031)
Troponin I: 0.08 ng/mL — ABNORMAL HIGH (ref <0.031)

## 2014-08-18 LAB — MRSA PCR SCREENING: MRSA BY PCR: NEGATIVE

## 2014-08-18 LAB — LIPASE, BLOOD: Lipase: 20 U/L (ref 11–59)

## 2014-08-18 LAB — GLUCOSE, CAPILLARY: Glucose-Capillary: 78 mg/dL (ref 70–99)

## 2014-08-18 MED ORDER — ACETAMINOPHEN 325 MG PO TABS: 650.0000 mg | ORAL_TABLET | Freq: Four times a day (QID) | ORAL | Status: DC | PRN

## 2014-08-18 MED ORDER — ACETAMINOPHEN 650 MG RE SUPP: 650.0000 mg | Freq: Four times a day (QID) | RECTAL | Status: DC | PRN

## 2014-08-18 MED ORDER — ACETAMINOPHEN 325 MG PO TABS
650.0000 mg | ORAL_TABLET | Freq: Once | ORAL | Status: AC
  Administered 2014-08-18: 650 mg via ORAL
  Filled 2014-08-18: qty 2

## 2014-08-18 MED ORDER — SODIUM CHLORIDE 0.9 % IV SOLN
INTRAVENOUS | Status: AC
  Administered 2014-08-18: 20:00:00 via INTRAVENOUS

## 2014-08-18 MED ORDER — HEPARIN SODIUM (PORCINE) 5000 UNIT/ML IJ SOLN
5000.0000 [IU] | Freq: Three times a day (TID) | INTRAMUSCULAR | Status: DC
  Filled 2014-08-18: qty 1

## 2014-08-18 MED ORDER — IOHEXOL 300 MG/ML  SOLN
25.0000 mL | INTRAMUSCULAR | Status: AC
  Administered 2014-08-18: 25 mL via ORAL

## 2014-08-18 MED ORDER — PIPERACILLIN-TAZOBACTAM 3.375 G IVPB 30 MIN
3.3750 g | Freq: Once | INTRAVENOUS | Status: AC
  Administered 2014-08-18: 3.375 g via INTRAVENOUS
  Filled 2014-08-18: qty 50

## 2014-08-18 MED ORDER — SODIUM CHLORIDE 0.9 % IV BOLUS (SEPSIS)
500.0000 mL | Freq: Once | INTRAVENOUS | Status: AC
  Administered 2014-08-18: 500 mL via INTRAVENOUS

## 2014-08-18 MED ORDER — PANTOPRAZOLE SODIUM 40 MG IV SOLR
40.0000 mg | INTRAVENOUS | Status: DC
  Administered 2014-08-18 – 2014-08-22 (×5): 40 mg via INTRAVENOUS
  Filled 2014-08-18 (×6): qty 40

## 2014-08-18 MED ORDER — INSULIN GLARGINE 100 UNIT/ML ~~LOC~~ SOLN
20.0000 [IU] | Freq: Every day | SUBCUTANEOUS | Status: DC
  Administered 2014-08-18: 20 [IU] via SUBCUTANEOUS
  Filled 2014-08-18 (×2): qty 0.2

## 2014-08-18 MED ORDER — ALUM & MAG HYDROXIDE-SIMETH 200-200-20 MG/5ML PO SUSP
30.0000 mL | Freq: Four times a day (QID) | ORAL | Status: DC | PRN
Start: 2014-08-18 — End: 2014-08-26
  Administered 2014-08-19 – 2014-08-20 (×2): 30 mL via ORAL
  Filled 2014-08-18 (×2): qty 30

## 2014-08-18 MED ORDER — INSULIN ASPART 100 UNIT/ML ~~LOC~~ SOLN: 0.0000 [IU] | Freq: Three times a day (TID) | SUBCUTANEOUS | Status: DC

## 2014-08-18 MED ORDER — MORPHINE SULFATE 2 MG/ML IJ SOLN
2.0000 mg | INTRAMUSCULAR | Status: DC | PRN
Start: 2014-08-18 — End: 2014-08-19
  Administered 2014-08-19: 2 mg via INTRAVENOUS
  Filled 2014-08-18 (×3): qty 1

## 2014-08-18 MED ORDER — VANCOMYCIN HCL IN DEXTROSE 750-5 MG/150ML-% IV SOLN
750.0000 mg | Freq: Two times a day (BID) | INTRAVENOUS | Status: DC
  Administered 2014-08-19: 750 mg via INTRAVENOUS
  Filled 2014-08-18 (×2): qty 150

## 2014-08-18 MED ORDER — INSULIN ASPART 100 UNIT/ML ~~LOC~~ SOLN: 0.0000 [IU] | Freq: Every day | SUBCUTANEOUS | Status: DC

## 2014-08-18 MED ORDER — BISACODYL 10 MG RE SUPP
10.0000 mg | Freq: Once | RECTAL | Status: AC
  Administered 2014-08-18: 10 mg via RECTAL
  Filled 2014-08-18: qty 1

## 2014-08-18 MED ORDER — BACLOFEN 5 MG HALF TABLET
5.0000 mg | ORAL_TABLET | Freq: Two times a day (BID) | ORAL | Status: DC
  Administered 2014-08-18 – 2014-08-26 (×16): 5 mg via ORAL
  Filled 2014-08-18 (×18): qty 1

## 2014-08-18 MED ORDER — ACETAMINOPHEN-CODEINE #4 300-60 MG PO TABS: 1.0000 | ORAL_TABLET | Freq: Two times a day (BID) | ORAL | Status: DC | PRN

## 2014-08-18 MED ORDER — PIPERACILLIN-TAZOBACTAM 3.375 G IVPB
3.3750 g | Freq: Three times a day (TID) | INTRAVENOUS | Status: DC
  Administered 2014-08-18 – 2014-08-22 (×13): 3.375 g via INTRAVENOUS
  Filled 2014-08-18 (×16): qty 50

## 2014-08-18 MED ORDER — HEPARIN SODIUM (PORCINE) 5000 UNIT/ML IJ SOLN
5000.0000 [IU] | Freq: Three times a day (TID) | INTRAMUSCULAR | Status: DC
  Administered 2014-08-18 – 2014-08-19 (×2): 5000 [IU] via SUBCUTANEOUS
  Filled 2014-08-18 (×4): qty 1

## 2014-08-18 MED ORDER — ACETAMINOPHEN-CODEINE #3 300-30 MG PO TABS
1.0000 | ORAL_TABLET | Freq: Two times a day (BID) | ORAL | Status: DC | PRN
  Filled 2014-08-18: qty 1

## 2014-08-18 MED ORDER — GLUCAGON HCL (RDNA) 1 MG IJ SOLR: 1.0000 mg | Freq: Once | INTRAMUSCULAR | Status: DC | PRN

## 2014-08-18 MED ORDER — VANCOMYCIN HCL IN DEXTROSE 1-5 GM/200ML-% IV SOLN
1000.0000 mg | Freq: Once | INTRAVENOUS | Status: AC
  Administered 2014-08-18: 1000 mg via INTRAVENOUS
  Filled 2014-08-18: qty 200

## 2014-08-18 MED ORDER — ONDANSETRON HCL 4 MG PO TABS: 4.0000 mg | ORAL_TABLET | Freq: Four times a day (QID) | ORAL | Status: DC | PRN

## 2014-08-18 MED ORDER — ONDANSETRON HCL 4 MG/2ML IJ SOLN: 4.0000 mg | Freq: Four times a day (QID) | INTRAMUSCULAR | Status: DC | PRN

## 2014-08-18 MED ORDER — SODIUM CHLORIDE 0.9 % IV BOLUS (SEPSIS)
1000.0000 mL | Freq: Once | INTRAVENOUS | Status: AC
  Administered 2014-08-18: 1000 mL via INTRAVENOUS

## 2014-08-18 MED ORDER — IOHEXOL 300 MG/ML  SOLN
100.0000 mL | Freq: Once | INTRAMUSCULAR | Status: AC | PRN
  Administered 2014-08-18: 100 mL via INTRAVENOUS

## 2014-08-18 MED ORDER — SODIUM CHLORIDE 0.9 % IV BOLUS (SEPSIS)
500.0000 mL | Freq: Once | INTRAVENOUS | Status: AC
Start: 2014-08-18 — End: 2014-08-18
  Administered 2014-08-18: 500 mL via INTRAVENOUS

## 2014-08-18 NOTE — ED Notes (Signed)
NOTIFIED DR. PICKERING IN PERSON FOR LAB RESULTS OF CG4+ LACTIC ACID @13 :07 PM ,08/18/2014.

## 2014-08-18 NOTE — ED Notes (Signed)
Pt in from home via [facility], per report pt c/o periumbilical abd & RUQ x 2 days, denies n/v/d, pt denies CP, A&O x4, follows commands, speaks in complete sentences, bil AKA, foley cath in place with yellow urine upon arrival to ED

## 2014-08-18 NOTE — ED Notes (Signed)
CT Notified re: pt completing oral contrast

## 2014-08-18 NOTE — ED Provider Notes (Signed)
CSN: [id number]     Arrival date & time 08/18/14  1218 History   First MD Initiated Contact with Patient 08/18/14 1232     Chief Complaint  Patient presents with  . Abdominal Pain     (Consider location/radiation/quality/duration/timing/severity/associated sxs/prior Treatment) Patient is a 74 y.o. male presenting with abdominal pain. The history is provided by the patient.  Abdominal Pain Associated symptoms: fatigue, fever and nausea   Associated symptoms: no chest pain, no diarrhea, no shortness of breath and no vomiting    patient presents with right-sided abdominal pain. Began a day or 2 ago. States that he feels weak all over. No fevers. Occasional cough. He has a chronic Foley. Found to be hypotensive. No chest pain. No trouble breathing. He states he abdominal pain is on his mid abdomen. He motions to the right side.  Past Medical History  Diagnosis Date  . Hyperlipidemia   . Cardiomyopathy, ischemic     EF 45% per ECHO 2008  //   EF 25%, echo, August, 2013 //  Echo (8/15):  Mild LVH, EF 30-35%, ant-lat and lat AK, inf HK, Gr 1 DD, mild MR, mild LAE  . Pulmonary hypertension     moderate ECHO Jan 2008  . Systolic heart failure   . Multiple sclerosis [place] -- LAST VISIT 11-20-2010  NOTE W/ CHART  . Lung nodule     resolved 11-2006 CT Chest  . Tobacco abuse     quit   . Increased prostate specific antigen (PSA) velocity   . Urinary retention     dx ~ 2-12, like from [place], now with a catheter, saw urology  . Carotid artery disease     a.  Doppler, February, 2012, 0-39% bilateral,Mild smooth plaque;  b.  Carotid US (8/15):  Bilateral 1-39% ICA >>> F/u 2 years  . Chronic indwelling Foley catheter   . Scoliosis associated with other condition   . Hemiparesis   . Fatigue SEVERE  . Impotence   . MI, acute, non ST segment elevation 2007    S/P PCI WITH X1 STENT (TAXUS DRUG-ELUTING) LEFT CIRCUMFLEX  . Diabetes mellitus     INSULIN-DEPENDANT  .  Hypertension   . H/O pleural effusion 2008    POST THORACENTESIS  . History of colon polyps PRECANCEROUS  . Insomnia   . CAD (coronary artery disease) CARDIOLOGIST- DR KATZ-- VISIT 06-05-2011 IN [facility]    non-STEMI, 2007.Marland Kitchenoccluded circumflex.. Taxus stent placed...residual 80% LAD...50% RCA  . Urinary tract infection     hx of  . Atherosclerotic PVD with ulceration     left foot  . PAC (premature atrial contraction)     December, 2013   Past Surgical History  Procedure Laterality Date  . Hernia repair  1990    (R)  . Thoracentesis  2008    PLEURAL EFFUSION  . Cystoscopy  07/30/2011    Procedure: CYSTOSCOPY;  Surgeon: Hanley Ben, MD;  Location: [facility];  Service: Urology;  Laterality: N/A;  . Transurethral resection of prostate  07/30/2011    Procedure: TRANSURETHRAL RESECTION OF THE PROSTATE (TURP);  Surgeon: Hanley Ben, MD;  Location[facility];  Service: Urology;  Laterality: N/A;  . Femoral-tibial bypass graft  03/11/2012    Procedure: BYPASS GRAFT FEMORAL-TIBIAL ARTERY;  Surgeon: Conrad Arpelar, MD;  Location: [place];  Service: Vascular;  Laterality: Left;  Left Femoral -Tibial trunk bypass, Endarterectomy of Tibial- Peroneal trunk with  vein angioplasty.  . Intraoperative arteriogram  03/11/2012    Procedure: INTRA OPERATIVE ARTERIOGRAM;  Surgeon: Conrad Griffithville, MD;  Location: [place];  Service: Vascular;  Laterality: Left;  . Femoral-popliteal bypass graft  03/11/2012    Procedure: BYPASS GRAFT FEMORAL-POPLITEAL ARTERY;  Surgeon: Conrad Fairburn, MD;  Location: [place];  Service: Vascular;  Laterality: Left;  embolectomy left lower leg  . Amputation  03/17/2012    Procedure: AMPUTATION ABOVE KNEE;  Surgeon: Conrad Hazel Green, MD;  Location: [place];  Service: Vascular;  Laterality: Left;  . Coronary angioplasty with stent placement  05-01-2006    OCCLUDED CIRCUMFLEX -- TAXUS STENT PLACMENT  AND RESIDUAL 80% LAD,  50% RCA  . Amputation  06/03/2012     Procedure: AMPUTATION ABOVE KNEE;  Surgeon: Conrad Prineville, MD;  Location: [place];  Service: Vascular;  Laterality: Right;  . Lower extremity angiogram Bilateral 12/17/2011    Procedure: LOWER EXTREMITY ANGIOGRAM;  Surgeon: Serafina Mitchell, MD;  Location: [facility] C[facility] LAB;  Service: Cardiovascular;  Laterality: Bilateral;  bil lower extrem angio  . Abdominal angiogram  12/17/2011    Procedure: ABDOMINAL ANGIOGRAM;  Surgeon: Serafina Mitchell, MD;  Location: [facility] CATH LAB;  Service: Cardiovascular;;  . Abdominal aortagram N/A 08/19/2013    Procedure: ABDOMINAL Maxcine Ham;  Surgeon: Conrad Le[place], MD;  Location: [facility] CATH LAB;  Service: Cardiovascular;  Laterality: N/A;   Family History  Problem Relation Age of Onset  . Hypertension    . Heart attack Mother 6  . Heart disease Mother   . Stroke Mother   . Hyperlipidemia Mother   . Hypertension Mother   . Colon cancer Neg Hx   . Prostate cancer Neg Hx   . COPD Father   . Peripheral vascular disease Father   . Diabetes Brother   . Heart disease Brother   . Hypertension Brother   . Heart attack Brother   . Diabetes Daughter    History  Substance Use Topics  . Smoking status: Former Smoker -- 77 years    Quit date: 07/22/2012  . Smokeless tobacco: Former Systems developer    Quit date: 08/05/2009     Comment: pt states that he is using E-cigs  . Alcohol Use: No    Review of Systems  Constitutional: Positive for fever, appetite change and fatigue. Negative for activity change.  Eyes: Negative for pain.  Respiratory: Negative for chest tightness and shortness of breath.   Cardiovascular: Negative for chest pain and leg swelling.  Gastrointestinal: Positive for nausea and abdominal pain. Negative for vomiting and diarrhea.  Genitourinary: Negative for flank pain.  Musculoskeletal: Negative for back pain and neck stiffness.  Skin: Negative for rash.  Neurological: Negative for weakness, numbness and headaches.  Psychiatric/Behavioral: Negative for behavioral  problems.      Allergies  Bee venom and Influenza vaccines  Home Medications   Prior to Admission medications   Medication Sig Start Date End Date Taking? Authorizing Provider  acetaminophen-codeine (TYLENOL #4) 300-60 MG per tablet Take 1 tablet by mouth 3 (three) times daily. Patient taking differently: Take 1 tablet by mouth 2 (two) times daily as needed for moderate pain or severe pain.  05/31/14  Yes Charlett Blake, MD  aspirin EC 325 MG tablet Take 325 mg by mouth daily.   Yes Historical Provider, MD  atorvastatin (LIPITOR) 40 MG tablet TAKE 1 TABLET (40 MG TOTAL) BY MOUTH AT BEDTIME.   Yes Colon Branch, MD  baclofen [facility])  10 MG tablet TAKE 0.5 TABLETS (5 MG TOTAL) BY MOUTH 2 (TWO) TIMES DAILY 05/31/14  Yes Charlett Blake, MD  benazepril (LOTENSIN) 10 MG tablet TAKE 1 TABLET BY MOUTH EVERY DAY   Yes Colon Branch, MD  diclofenac sodium (VOLTAREN) 1 % GEL Apply 1 application topically 2 (two) times daily as needed (pain). For pain 03/09/12  Yes Charlett Blake, MD  insulin aspart protamine- aspart (NOVOLOG MIX 70/30) (70-30) 100 UNIT/ML injection Inject 15-45 Units into the skin 2 (two) times daily with a meal. Take 45 units in the morning with breakfast and take 15 units at supper   Yes Historical Provider, MD  metoprolol succinate (TOPROL-XL) 25 MG 24 hr tablet TAKE 1.5 TABLETS (37.5 MG TOTAL) BY MOUTH DAILY.   Yes Colon Branch, MD  pantoprazole (PROTONIX) 40 MG tablet TAKE 1 TABLET BY MOUTH EVERY DAY AT 12 NOON 07/25/14  Yes Colon Branch, MD  gabapentin (NEURONTIN) 300 MG capsule Take 2 capsules (600 mg total) by mouth 3 (three) times daily. Patient not taking: Reported on 08/18/2014 02/11/14   Dudley Major, DO  glucagon (GLUCAGEN) 1 MG SOLR injection Inject 1 mg into the vein once as needed for low blood sugar.    Historical Provider, MD  mupirocin ointment (BACTROBAN) 2 % Apply 1 application topically 2 (two) times daily. Patient not taking: Reported on 08/18/2014 05/25/14    Colon Branch, MD   BP 108/69 mmHg  Pulse 87  Temp(Src) 99.2 F (37.3 C) (Rectal)  Resp 22  Ht 5\' 11"  (1.803 m)  Wt 155 lb (70.308 kg)  BMI 21.63 kg/m2  SpO2 97% Physical Exam  Constitutional: He is oriented to person, place, and time. He appears well-developed and well-nourished.  HENT:  Head: Normocephalic and atraumatic.  Eyes: Pupils are equal, round, and reactive to light.  Neck: Normal range of motion. Neck supple.  Cardiovascular: Normal rate, regular rhythm and normal heart sounds.   No murmur heard. Pulmonary/Chest: Effort normal and breath sounds normal.  Abdominal: Soft. He exhibits no distension and no mass. There is tenderness. There is no rebound and no guarding.  Moderate tenderness to right midabdomen. No hernias palpated.  Musculoskeletal: Normal range of motion. He exhibits no edema.  Bilateral above-the-knee amputations.  Neurological: He is alert and oriented to person, place, and time. No cranial nerve deficit.  Skin: Skin is warm and dry.  Psychiatric: He has a normal mood and affect.  Nursing note and vitals reviewed.   ED Course  Procedures (including critical care time) Labs Review Labs Reviewed  CBC WITH DIFFERENTIAL/PLATELET - Abnormal; Notable for the following:    WBC 19.9 (*)    RBC 6.00 (*)    RDW 16.7 (*)    Neutrophils Relative % 87 (*)    Neutro Abs 17.3 (*)    Lymphocytes Relative 6 (*)    Monocytes Absolute 1.4 (*)    All other components within normal limits  COMPREHENSIVE METABOLIC PANEL - Abnormal; Notable for the following:    CO2 18 (*)    Glucose, Bld 311 (*)    Albumin 3.4 (*)    Alkaline Phosphatase 146 (*)    Total Bilirubin 1.4 (*)    GFR calc non Af Amer 56 (*)    GFR calc Af Amer 65 (*)    All other components within normal limits  TROPONIN I - Abnormal; Notable for the following:    Troponin I 0.07 (*)    All  other components within normal limits  URINALYSIS, ROUTINE W REFLEX MICROSCOPIC - Abnormal; Notable for the  following:    Color, Urine AMBER (*)    APPearance CLOUDY (*)    Hgb urine dipstick SMALL (*)    Bilirubin Urine SMALL (*)    Ketones, ur 40 (*)    Protein, ur >300 (*)    Nitrite POSITIVE (*)    Leukocytes, UA SMALL (*)    All other components within normal limits  URINE MICROSCOPIC-ADD ON - Abnormal; Notable for the following:    Squamous Epithelial / LPF FEW (*)    Bacteria, UA MANY (*)    Casts WBC CAST (*)    All other components within normal limits  I-STAT CG4 LACTIC ACID, ED - Abnormal; Notable for the following:    Lactic Acid, Venous 3.62 (*)    All other components within normal limits  I-STAT CHEM 8, ED - Abnormal; Notable for the following:    BUN 26 (*)    Glucose, Bld 318 (*)    Calcium, Ion 1.06 (*)    Hemoglobin 18.7 (*)    HCT 55.0 (*)    All other components within normal limits  I-STAT CG4 LACTIC ACID, ED - Abnormal; Notable for the following:    Lactic Acid, Venous 4.70 (*)    All other components within normal limits  CULTURE, BLOOD (ROUTINE X 2)  CULTURE, BLOOD (ROUTINE X 2)  URINE CULTURE  LIPASE, BLOOD    Imaging Review Ct Abdomen Pelvis W Contrast  08/18/2014   CLINICAL DATA:  Periumbilical and right upper quadrant pain for 2 days. Elevated white blood cell count and elevated lactic acid.  EXAM: CT ABDOMEN AND PELVIS WITH CONTRAST  TECHNIQUE: Multidetector CT imaging of the abdomen and pelvis was performed using the standard protocol following bolus administration of intravenous contrast.  CONTRAST:  100 mL OMNIPAQUE IOHEXOL 300 MG/ML  SOLN  COMPARISON:  CT abdomen and pelvis 10/09/2010.  FINDINGS: Mild dependent atelectasis is present in the lung bases. Heart size is enlarged. There is calcific coronary artery disease.  The gallbladder is markedly dilated with wall thickening and surrounding inflammatory change. The liver is diffusely low attenuating with a 1.4 cm cyst on image 15 which is unchanged in appearance. The biliary tree, adrenal glands, spleen  and pancreas appear normal. Bilateral renal cysts are noted.  The patient has aortoiliac atherosclerosis. The right common iliac artery is dilated at 1.8 cm. The left common iliac artery measures 1.5 cm. There is a large volume of stool in the cecum. The colon is otherwise unremarkable. The stomach and small bowel appear normal. No lymphadenopathy is identified. Small fat containing umbilical hernia is noted.  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 No other focal bony lesion is identified.  IMPRESSION: Findings highly suspicious for acute cholecystitis. Right upper quadrant ultrasound could be used for further evaluation.  Fatty infiltration of the liver.  Calcific aortic and coronary atherosclerosis. 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  Small fat containing umbilical hernia.  Avascular necrosis of the femoral heads bilaterally.   Electronically Signed   By: Inge Rise M.D.   On: 08/18/2014 16:23   Dg Chest Portable 1 View  08/18/2014   CLINICAL DATA:  Initial encounter for abdominal pain.  Ex-smoker.  EXAM: PORTABLE CHEST - 1 VIEW  COMPARISON:  12/16/2013  FINDINGS: Numerous leads and wires project over  the chest. Midline trachea. Moderate cardiomegaly. No pleural effusion or pneumothorax. mild subsegmental atelectasis at the left lung base. No gross free intraperitoneal air. Convex right thoracolumbar spine curvature.  IMPRESSION: Cardiomegaly, without acute disease.   Electronically Signed   By: Abigail Miyamoto M.D.   On: 08/18/2014 13:58     EKG Interpretation   Date/Time:  Thursday August 18 2014 12:28:18 EST Ventricular Rate:  111 PR Interval:  125 QRS Duration: 113 QT Interval:  399 QTC Calculation: 542 R Axis:   -46 Text Interpretation:  sinus or ectopic atrial tachycardia. Incomplete left  bundle branch block LVH with secondary repolarization abnormality Anterior  ST elevation,  probably due to LVH Prolonged QT interval Confirmed by  Alvino Chapel  MD, Ovid Curd [phone]) on 08/18/2014 1:59:13 PM      MDM   Final diagnoses:  Sepsis, due to unspecified organism  Urinary tract infection associated with catheterization of urinary tract, initial encounter  Lactic acidosis  Cholecystitis    CRITICAL CARE Performed by: Mackie Pai Total critical care time: 35 Critical care time was exclusive of separately billable procedures and treating other patients. Critical care was necessary to treat or prevent imminent or life-threatening deterioration. Critical care was time spent personally by me on the following activities: development of treatment plan with patient and/or surrogate as well as nursing, discussions with consultants, evaluation of patient's response to treatment, examination of patient, obtaining history from patient or surrogate, ordering and performing treatments and interventions, ordering and review of laboratory studies, ordering and review of radiographic studies, pulse oximetry and re-evaluation of patient's condition.  Patient with fever. Hypotension. Has history of CHF so fluid was given judiciously. Zosyn and vancomycin started empirically. Found later to have urinary tract infection. Initial lactate was elevated and more fluid was given. Repeat lactate is even more elevated. Patient is overall well-appearing and blood pressure had improved to around 132 systolic with fluid boluses. Critical care consulted and believed this could be medicine admission. CT scan returned after admission and showed potentially cholecystitis. General surgery has been notified.  Jasper Riling. Alvino Chapel, MD 08/18/14 [phone]

## 2014-08-18 NOTE — Progress Notes (Signed)
ANTIBIOTIC CONSULT NOTE - INITIAL  Pharmacy Consult for vancomycin + zosyn Indication: rule out sepsis  Allergies  Allergen Reactions  . Bee Venom Anaphylaxis  . Influenza Vaccines Other (See Comments)    Sick for months    Patient Measurements: Height: 5\' 11"  (180.3 cm) Weight: 155 lb (70.308 kg) IBW/kg (Calculated) : 75.3 Adjusted Body Weight:   Vital Signs: Temp: 100.8 F (38.2 C) (01/28 1253) Temp Source: Rectal (01/28 1253) BP: 101/71 mmHg (01/28 1351) Pulse Rate: 102 (01/28 1351) Intake/Output from previous day:   Intake/Output from this shift:    Labs:  Recent Labs  08/18/14 1250 08/18/14 1340  WBC 19.9*  --   HGB 16.1 18.7*  PLT 256  --   CREATININE 1.25 1.10   Estimated Creatinine Clearance: 60.4 mL/min (by C-G formula based on Cr of 1.1). No results for input(s): VANCOTROUGH, VANCOPEAK, VANCORANDOM, GENTTROUGH, GENTPEAK, GENTRANDOM, TOBRATROUGH, TOBRAPEAK, TOBRARND, AMIKACINPEAK, AMIKACINTROU, AMIKACIN in the last 72 hours.   Microbiology: No results found for this or any previous visit (from the past 720 hour(s)).  Medical History: Past Medical History  Diagnosis Date  . Hyperlipidemia   . Cardiomyopathy, ischemic     EF 45% per ECHO 2008  //   EF 25%, echo, August, 2013 //  Echo (8/15):  Mild LVH, EF 30-35%, ant-lat and lat AK, inf HK, Gr 1 DD, mild MR, mild LAE  . Pulmonary hypertension     moderate ECHO Jan 2008  . Systolic heart failure   . Multiple sclerosis [name] -- LAST VISIT 11-20-2010  NOTE W/ CHART  . Lung nodule     resolved 11-2006 CT Chest  . Tobacco abuse     quit   . Increased prostate specific antigen (PSA) velocity   . Urinary retention     dx ~ 2-12, like from [place], now with a catheter, saw urology  . Carotid artery disease     a.  Doppler, February, 2012, 0-39% bilateral,Mild smooth plaque;  b.  Carotid US (8/15):  Bilateral 1-39% ICA >>> F/u 2 years  . Chronic indwelling Foley catheter   .  Scoliosis associated with other condition   . Hemiparesis   . Fatigue SEVERE  . Impotence   . MI, acute, non ST segment elevation 2007    S/P PCI WITH X1 STENT (TAXUS DRUG-ELUTING) LEFT CIRCUMFLEX  . Diabetes mellitus     INSULIN-DEPENDANT  . Hypertension   . H/O pleural effusion 2008    POST THORACENTESIS  . History of colon polyps PRECANCEROUS  . Insomnia   . CAD (coronary artery disease) CARDIOLOGIST- DR KATZ-- VISIT 06-05-2011 IN [facility]    non-STEMI, 2007.Marland Kitchenoccluded circumflex.. Taxus stent placed...residual 80% LAD...50% RCA  . Urinary tract infection     hx of  . Atherosclerotic PVD with ulceration     left foot  . PAC (premature atrial contraction)     December, 2013    Medications:  Anti-infectives    Start     Dose/Rate Route Frequency Ordered Stop   08/19/14 0300  vancomycin (VANCOCIN) IVPB 750 mg/150 ml premix     750 mg150 mL/hr over 60 Minutes Intravenous Every 12 hours 08/18/14 1359     08/18/14 2000  piperacillin-tazobactam (ZOSYN) IVPB 3.375 g     3.375 g12.5 mL/hr over 240 Minutes Intravenous Every 8 hours 08/18/14 1359     08/18/14 1315  piperacillin-tazobactam (ZOSYN) IVPB 3.375 g     3.375 g100 mL/hr over  30 Minutes Intravenous  Once 08/18/14 1300     08/18/14 1315  vancomycin (VANCOCIN) IVPB 1000 mg/200 mL premix     1,000 mg200 mL/hr over 60 Minutes Intravenous  Once 08/18/14 1300       Assessment: 23 yom presented to the ED with abdominal pain. To start empiric vancomycin + zosyn for sepsis. Tmax is 100.8 and WBC is elevated at 19.9. Scr is WNL at 1.1 and lactic acid is elevated at 3.62. First doses ordered by MD.   Jaime [name] 1/28>> Zosyn 1/28>>  Goal of Therapy:  Vancomycin trough level 15-20 mcg/ml  Plan:  1. Zosyn 3.375gm IV Q8H (4 hr inf) 2. Vanc 750mg  IV Q12H 3. F/u renal fxn, C&S, clinical status and trough at [place], Jaime [name] 08/18/2014,2:00 PM

## 2014-08-18 NOTE — ED Notes (Signed)
PT monitored by pulse ox, bp cuff, and 12-lead. 

## 2014-08-18 NOTE — ED Notes (Signed)
PT returned from scans. MD at bedside.

## 2014-08-18 NOTE — ED Notes (Signed)
Lactic acid results given to Dr. Pickering 

## 2014-08-18 NOTE — H&P (Signed)
[facility] History and Physical                                                                                    Patient Demographics  Jaime [name], is a 74 y.o. male  MRN: [id number]   DOB - 13-Feb-1941  Admit Date - 08/18/2014  Outpatient Primary MD for the patient is [name], MD   With History of -  Past Medical History  Diagnosis Date  . Hyperlipidemia   . Cardiomyopathy, ischemic     EF 45% per ECHO 2008  //   EF 25%, echo, August, 2013 //  Echo (8/15):  Mild LVH, EF 30-35%, ant-lat and lat AK, inf HK, Gr 1 DD, mild MR, mild LAE  . Pulmonary hypertension     moderate ECHO Jan 2008  . Systolic heart failure   . Multiple sclerosis [place] -- LAST VISIT 11-20-2010  NOTE W/ CHART  . Lung nodule     resolved 11-2006 CT Chest  . Tobacco abuse     quit   . Increased prostate specific antigen (PSA) velocity   . Urinary retention     dx ~ 2-12, like from [place], now with a catheter, saw urology  . Carotid artery disease     a.  Doppler, February, 2012, 0-39% bilateral,Mild smooth plaque;  b.  Carotid US (8/15):  Bilateral 1-39% ICA >>> F/u 2 years  . Chronic indwelling Foley catheter   . Scoliosis associated with other condition   . Hemiparesis   . Fatigue SEVERE  . Impotence   . MI, acute, non ST segment elevation 2007    S/P PCI WITH X1 STENT (TAXUS DRUG-ELUTING) LEFT CIRCUMFLEX  . Diabetes mellitus     INSULIN-DEPENDANT  . Hypertension   . H/O pleural effusion 2008    POST THORACENTESIS  . History of colon polyps PRECANCEROUS  . Insomnia   . CAD (coronary artery disease) CARDIOLOGIST- DR KATZ-- VISIT 06-05-2011 IN [facility]    non-STEMI, 2007.Marland Kitchenoccluded circumflex.. Taxus stent placed...residual 80% LAD...50% RCA  . Urinary tract infection     hx of  . Atherosclerotic PVD with ulceration     left foot  . PAC (premature atrial contraction)     December, 2013      Past Surgical History  Procedure Laterality Date  . Hernia repair  1990     (R)  . Thoracentesis  2008    PLEURAL EFFUSION  . Cystoscopy  07/30/2011    Procedure: CYSTOSCOPY;  Surgeon: Hanley Ben, MD;  Location: [facility];  Service: Urology;  Laterality: N/A;  . Transurethral resection of prostate  07/30/2011    Procedure: TRANSURETHRAL RESECTION OF THE PROSTATE (TURP);  Surgeon: Hanley Ben, MD;  Location: [facility];  Service: Urology;  Laterality: N/A;  . Femoral-tibial bypass graft  03/11/2012    Procedure: BYPASS GRAFT FEMORAL-TIBIAL ARTERY;  Surgeon: Conrad Henderson, MD;  Location: [place];  Service: Vascular;  Laterality: Left;  Left Femoral -Tibial trunk bypass, Endarterectomy of Tibial- Peroneal trunk with vein angioplasty.  . Intraoperative arteriogram  03/11/2012    Procedure: INTRA OPERATIVE  ARTERIOGRAM;  Surgeon: Conrad [place], MD;  Location: [place];  Service: Vascular;  Laterality: Left;  . Femoral-popliteal bypass graft  03/11/2012    Procedure: BYPASS GRAFT FEMORAL-POPLITEAL ARTERY;  Surgeon: Conrad [place], MD;  Location: [place];  Service: Vascular;  Laterality: Left;  embolectomy left lower leg  . Amputation  03/17/2012    Procedure: AMPUTATION ABOVE KNEE;  Surgeon: Conrad [place], MD;  Location: [place];  Service: Vascular;  Laterality: Left;  . Coronary angioplasty with stent placement  05-01-2006    OCCLUDED CIRCUMFLEX -- TAXUS STENT PLACMENT  AND RESIDUAL 80% LAD,  50% RCA  . Amputation  06/03/2012    Procedure: AMPUTATION ABOVE KNEE;  Surgeon: Conrad C[place], MD;  Location: [place];  Service: Vascular;  Laterality: Right;  . Lower extremity angiogram Bilateral 12/17/2011    Procedure: LOWER EXTREMITY ANGIOGRAM;  Surgeon: Serafina Mitchell, MD;  Location: [facility] CATH LAB;  Service: Cardiovascular;  Laterality: Bilateral;  bil lower extrem angio  . Abdominal angiogram  12/17/2011    Procedure: ABDOMINAL ANGIOGRAM;  Surgeon: Serafina Mitchell, MD;  Location: [facility] CATH LAB;  Service: Cardiovascular;;  . Abdominal aortagram N/A 08/19/2013     Procedure: ABDOMINAL Maxcine Ham;  Surgeon: Conrad , MD;  Location: [facility] CATH LAB;  Service: Cardiovascular;  Laterality: N/A;    in for   Chief Complaint  Patient presents with  . Abdominal Pain     HPI  Jaime [name]  is a 74 y.o. male,   With a history of MS, CHF (EF 30-35%), insulin-dependent dependent diabetes, chronic indwelling Foley, coronary artery disease, and peripheral vascular disease status post bilateral AKA. The history is largely gathered from his wife as the patient is pleasantly confused. Jaime [name] started developing abdominal pain Wednesday morning.  he subsequently started to vomit and he has not been able to keep anything down since. His wife noticed that he was too weak to sit up,as he normally does.  His CBGs became elevated and his wife noticed a temperature of 100.7.  She mentions that he had no bowel movement yesterday and his urine looked much darker than usual.  In the ER Jaime [name] was found to have a systolic blood pressure of 80 and a lactic acid of 4.7.  He was significantly hemoconcentrated with a hemoglobin of 18.7, and hematocrit of 55. Troponin slightly elevated at 0.07 with an abnormal EKG. WBC is 19.9.  UA appears positive for infection, and CT abdomen and pelvis Indicates acute cholecystitis. General surgery was consulted and feels that the patient may be a poor candidate for cholecystectomy. They have recommended that IR evaluate for possible percutaneous cholecystostomy tube placement on 08/19/14.  Due to hypotension and lactic acidosis critical care was consulted and by the ER physician. CCM does not feel that the patient needs ICU admission and therefore requested that [facility] consult. The patient's blood pressure has improved after IV fluids.  Review of Systems    In addition to the HPI above,   No Headache, No changes with Vision or hearing, No problems swallowing food or Liquids, No Chest pain, Cough or Shortness of Breath, ++  Abdominal pain, ++ Nausea or Vomiting No Blood in stool or Urine, No dysuria, No new skin rashes or bruises, No new joints pains-aches,  No new weakness, tingling, numbness in any extremity, No recent weight gain or loss, No polyuria, polydypsia or polyphagia, No significant Mental Stressors.  A full 10 point Review of Systems was done,  except as stated above, all other Review of Systems were negative.   Social History History  Substance Use Topics  . Smoking status: Former Smoker -- 30 years    Quit date: 07/22/2012  . Smokeless tobacco: Former Systems developer    Quit date: 08/05/2009     Comment: pt states that he is using E-cigs  . Alcohol Use: No     Family History Family History  Problem Relation Age of Onset  . Hypertension    . Heart attack Mother 52  . Heart disease Mother   . Stroke Mother   . Hyperlipidemia Mother   . Hypertension Mother   . Colon cancer Neg Hx   . Prostate cancer Neg Hx   . COPD Father   . Peripheral vascular disease Father   . Diabetes Brother   . Heart disease Brother   . Hypertension Brother   . Heart attack Brother   . Diabetes Daughter      Prior to Admission medications   Medication Sig Start Date End Date Taking? Authorizing Provider  acetaminophen-codeine (TYLENOL #4) 300-60 MG per tablet Take 1 tablet by mouth 3 (three) times daily. Patient taking differently: Take 1 tablet by mouth 2 (two) times daily as needed for moderate pain or severe pain.  05/31/14  Yes Charlett Blake, MD  aspirin EC 325 MG tablet Take 325 mg by mouth daily.   Yes Historical Provider, MD  atorvastatin (LIPITOR) 40 MG tablet TAKE 1 TABLET (40 MG TOTAL) BY MOUTH AT BEDTIME.   Yes Colon Branch, MD  baclofen (LIORESAL) 10 MG tablet TAKE 0.5 TABLETS (5 MG TOTAL) BY MOUTH 2 (TWO) TIMES DAILY 05/31/14  Yes Charlett Blake, MD  benazepril (LOTENSIN) 10 MG tablet TAKE 1 TABLET BY MOUTH EVERY DAY   Yes Colon Branch, MD  diclofenac sodium (VOLTAREN) 1 % GEL Apply 1  application topically 2 (two) times daily as needed (pain). For pain 03/09/12  Yes Charlett Blake, MD  insulin aspart protamine- aspart (NOVOLOG MIX 70/30) (70-30) 100 UNIT/ML injection Inject 15-45 Units into the skin 2 (two) times daily with a meal. Take 45 units in the morning with breakfast and take 15 units at supper   Yes Historical Provider, MD  metoprolol succinate (TOPROL-XL) 25 MG 24 hr tablet TAKE 1.5 TABLETS (37.5 MG TOTAL) BY MOUTH DAILY.   Yes Colon Branch, MD  pantoprazole (PROTONIX) 40 MG tablet TAKE 1 TABLET BY MOUTH EVERY DAY AT 12 NOON 07/25/14  Yes Colon Branch, MD  gabapentin (NEURONTIN) 300 MG capsule Take 2 capsules (600 mg total) by mouth 3 (three) times daily. Patient not taking: Reported on 08/18/2014 02/11/14   Dudley Major, DO  glucagon (GLUCAGEN) 1 MG SOLR injection Inject 1 mg into the vein once as needed for low blood sugar.    Historical Provider, MD  mupirocin ointment (BACTROBAN) 2 % Apply 1 application topically 2 (two) times daily. Patient not taking: Reported on 08/18/2014 05/25/14   Colon Branch, MD    Allergies  Allergen Reactions  . Bee Venom Anaphylaxis  . Influenza Vaccines Other (See Comments)    Sick for months    Physical Exam  Vitals  Blood pressure 144/74, pulse 89, temperature 99.2 F (37.3 C), temperature source Rectal, resp. rate 18, height 5\' 11"  (1.803 m), weight 70.308 kg (155 lb), SpO2 100 %.   General:  lying in bed in NAD, pleasant, well cared for male who appears stated age, wife at  bedside.  Psych:  Pleasantly confused, awake, oriented, cooperative  ENT:  Ears and Eyes appear Normal, Conjunctivae clear, PERR. Moist Oral Mucosa.  Neck:  Supple Neck, No JVD, No cervical lymphadenopathy appreciated  Respiratory:  Symmetrical Chest wall movement, Good air movement bilaterally, CTAB.  Cardiac:  RRR, No Gallops, Rubs or Murmurs, No Parasternal Heave.  Abdomen: Tympanic bowel sounds, Abdomen Soft, mildly distended, Non tender, No  organomegaly appreciated.  No CVA tenderness.  Skin:  No Cyanosis, Normal Skin Turgor, No Skin Rash or Bruise.  Musculoskeletal: Severe scoliosis, bilateral AKA's   Data Review  CBC  Recent Labs Lab 08/18/14 1250 08/18/14 1340  WBC 19.9*  --   HGB 16.1 18.7*  HCT 47.4 55.0*  PLT 256  --   MCV 79.0  --   MCH 26.8  --   MCHC 34.0  --   RDW 16.7*  --   LYMPHSABS 1.2  --   MONOABS 1.4*  --   EOSABS 0.0  --   BASOSABS 0.0  --    ------------------------------------------------------------------------------------------------------------------  Chemistries   Recent Labs Lab 08/18/14 1250 08/18/14 1340  NA 135 135  K 4.3 4.3  CL 102 103  CO2 18*  --   GLUCOSE 311* 318*  BUN 21 26*  CREATININE 1.25 1.10  CALCIUM 9.2  --   AST 34  --   ALT 44  --   ALKPHOS 146*  --   BILITOT 1.4*  --       Cardiac Enzymes  Recent Labs Lab 08/18/14 1250  TROPONINI 0.07*    Urinalysis    Component Value Date/Time   COLORURINE AMBER* 08/18/2014 1353   APPEARANCEUR CLOUDY* 08/18/2014 1353   LABSPEC 1.024 08/18/2014 1353   PHURINE 6.0 08/18/2014 1353   GLUCOSEU NEGATIVE 08/18/2014 1353   HGBUR SMALL* 08/18/2014 1353   BILIRUBINUR SMALL* 08/18/2014 1353   KETONESUR 40* 08/18/2014 1353   PROTEINUR >300* 08/18/2014 1353   UROBILINOGEN 1.0 08/18/2014 1353   NITRITE POSITIVE* 08/18/2014 1353   LEUKOCYTESUR SMALL* 08/18/2014 1353    ----------------------------------------------------------------------------------------------------------------  Imaging results:   Ct Abdomen Pelvis W Contrast  08/18/2014   CLINICAL DATA:  Periumbilical and right upper quadrant pain for 2 days. Elevated white blood cell count and elevated lactic acid.  EXAM: CT ABDOMEN AND PELVIS WITH CONTRAST  TECHNIQUE: Multidetector CT imaging of the abdomen and pelvis was performed using the standard protocol following bolus administration of intravenous contrast.  CONTRAST:  100 mL OMNIPAQUE IOHEXOL 300  MG/ML  SOLN  COMPARISON:  CT abdomen and pelvis 10/09/2010.  FINDINGS: Mild dependent atelectasis is present in the lung bases. Heart size is enlarged. There is calcific coronary artery disease.  The gallbladder is markedly dilated with wall thickening and surrounding inflammatory change. The liver is diffusely low attenuating with a 1.4 cm cyst on image 15 which is unchanged in appearance. The biliary tree, adrenal glands, spleen and pancreas appear normal. Bilateral renal cysts are noted.  The patient has aortoiliac atherosclerosis. The right common iliac artery is dilated at 1.8 cm. The left common iliac artery measures 1.5 cm. There is a large volume of stool in the cecum. The colon is otherwise unremarkable. The stomach and small bowel appear normal. No lymphadenopathy is identified. Small fat containing umbilical hernia is noted.  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 No other focal bony lesion is identified.  IMPRESSION: Findings highly suspicious for acute cholecystitis. Right upper quadrant ultrasound could be used for further evaluation.  Fatty infiltration of the liver.  Calcific aortic and coronary atherosclerosis. 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  Small fat containing umbilical hernia.  Avascular necrosis of the femoral heads bilaterally.   Electronically Signed   By: Inge Rise M.D.   On: 08/18/2014 16:23   Dg Chest Portable 1 View  08/18/2014   CLINICAL DATA:  Initial encounter for abdominal pain.  Ex-smoker.  EXAM: PORTABLE CHEST - 1 VIEW  COMPARISON:  12/16/2013  FINDINGS: Numerous leads and wires project over the chest. Midline trachea. Moderate cardiomegaly. No pleural effusion or pneumothorax. mild subsegmental atelectasis at the left lung base. No gross free intraperitoneal air. Convex right thoracolumbar spine curvature.  IMPRESSION: Cardiomegaly, without acute disease.    Electronically Signed   By: Abigail Miyamoto M.D.   On: 08/18/2014 13:58    My personal review of EKG:  Sinus tach, possible ST depression in lead 1, possible ST elevation in V1.  Will ask cardiology for their read.    Assessment & Plan  Principal Problem:   Sepsis Active Problems:   Multiple sclerosis   Pulmonary hypertension   S/P AKA (above knee amputation) bilateral   Mild cognitive impairment   Chronic systolic CHF (congestive heart failure)   Acute cholecystitis   UTI (lower urinary tract infection)   IDDM (insulin dependent diabetes mellitus)   Sepsis with lactic acidosis. Likely from Acute cholecystitis, but could be from UTI as well. The patient will be admitted to stepdown  Blood and urine cultures obtained.  Will recheck lactic acid in the morning. Patient has been started on IV Zosyn He is received 1 L IV bolus in the ER and has been started on gentle IV fluids for the next 12 hours. While he is currently significantly dehydrated, we will be careful with hydration as he has chronic systolic heart failure.  Hypotension in the setting of a history of hypertension Metoprolol and Lotensin are currently being held. The patient is receiving IV fluids to sustain his blood pressure.  Acute cholecystitis Patient with abdominal pain, normal lipase, mildly elevated total bili. Acute cholecystitis on CT scan. He has been started on [facility] surgery has consulted and recommended IR evaluation for percutaneous cholecystostomy tube.  Elevated troponin with abnormal EKG Patient is not complaining of chest pain. This is likely demand ischemia.  We will cycle troponins.  Cardiology does not feel a consult is needed.   Urinary tract infection UA is questionable for infection. Culture has been sent. please follow the results. Patient has been started on Zosyn which will cover both acute cholecystitis and urinary tract infection.  Insulin-dependent diabetes The patient is  normally on 70/30 at home, 15 units at night and 45 units in the morning. We will place him on a clear liquid diet this evening, with sliding scale sensitive and Lantus 20 units daily at bedtime.  Chronic CHF EF of 30 - 35% in 03/09/2014.  Patient is not fluid overloaded (rather he is dehydrated). We are holding his BB at this point due to hypotension.  Will need to restart metoprolol when appropriate. Carefully monitor of fluid overload.   DVT Prophylaxis:heparin  AM Labs Ordered, also please review Full Orders  Family Communication:   Wife and sister in law at bedside.   Code Status:  Full (but only try once).  Does not want long term life support.  Likely DC to  Home  Condition:  guarded  Time spent in minutes : 60    [place], Bobby Rumpf PA-C on 08/18/2014 at 5:49 PM  Between 7am to 7pm - Pager - [phone]  After 7pm go to [email or web address] - password [email or web address]  And look for the night coverage person covering me after hours  [facility]

## 2014-08-18 NOTE — Consult Note (Signed)
Reason for Consult: Possible cholecystitis Referring Physician: Dr. Shana Chute is an 74 y.o. male.  HPI: This is a pleasant 74 year old gentleman who presents with a 2 day history of upper abdominal pain, either, and generalized malaise. He has no previous history of similar abdominal pain. He has MS and a chronic Foley catheter. He was found to have an elevated white blood count, and elevated lactic acid level, size consistent with a urinary tract infection as well as a CAT scan showing suspected acute cholecystitis. He is being admitted to the medical service because of his chronic medical problems.  He is currently more comfortable and having less discomfort in the right upper quadrant. He has no nausea or vomiting.  Past Medical History  Diagnosis Date  . Hyperlipidemia   . Cardiomyopathy, ischemic     EF 45% per ECHO 2008  //   EF 25%, echo, August, 2013 //  Echo (8/15):  Mild LVH, EF 30-35%, ant-lat and lat AK, inf HK, Gr 1 DD, mild MR, mild LAE  . Pulmonary hypertension     moderate ECHO Jan 2008  . Systolic heart failure   . Multiple sclerosis [place] -- LAST VISIT 11-20-2010  NOTE W/ CHART  . Lung nodule     resolved 11-2006 CT Chest  . Tobacco abuse     quit   . Increased prostate specific antigen (PSA) velocity   . Urinary retention     dx ~ 2-12, like from [place], now with a catheter, saw urology  . Carotid artery disease     a.  Doppler, February, 2012, 0-39% bilateral,Mild smooth plaque;  b.  Carotid US (8/15):  Bilateral 1-39% ICA >>> F/u 2 years  . Chronic indwelling Foley catheter   . Scoliosis associated with other condition   . Hemiparesis   . Fatigue SEVERE  . Impotence   . MI, acute, non ST segment elevation 2007    S/P PCI WITH X1 STENT (TAXUS DRUG-ELUTING) LEFT CIRCUMFLEX  . Diabetes mellitus     INSULIN-DEPENDANT  . Hypertension   . H/O pleural effusion 2008    POST THORACENTESIS  . History of colon polyps  PRECANCEROUS  . Insomnia   . CAD (coronary artery disease) CARDIOLOGIST- DR KATZ-- VISIT 06-05-2011 IN [facility]    non-STEMI, 2007.Marland Kitchenoccluded circumflex.. Taxus stent placed...residual 80% LAD...50% RCA  . Urinary tract infection     hx of  . Atherosclerotic PVD with ulceration     left foot  . PAC (premature atrial contraction)     December, 2013    Past Surgical History  Procedure Laterality Date  . Hernia repair  1990    (R)  . Thoracentesis  2008    PLEURAL EFFUSION  . Cystoscopy  07/30/2011    Procedure: CYSTOSCOPY;  Surgeon: Hanley Ben, MD;  Location: [facility];  Service: Urology;  Laterality: N/A;  . Transurethral resection of prostate  07/30/2011    Procedure: TRANSURETHRAL RESECTION OF THE PROSTATE (TURP);  Surgeon: Hanley Ben, MD;  Location: [facility];  Service: Urology;  Laterality: N/A;  . Femoral-tibial bypass graft  03/11/2012    Procedure: BYPASS GRAFT FEMORAL-TIBIAL ARTERY;  Surgeon: Conrad Ashaway, MD;  Location: [place];  Service: Vascular;  Laterality: Left;  Left Femoral -Tibial trunk bypass, Endarterectomy of Tibial- Peroneal trunk with vein angioplasty.  . Intraoperative arteriogram  03/11/2012    Procedure: INTRA OPERATIVE ARTERIOGRAM;  Surgeon: Jannette Fogo  Bridgett Larsson, MD;  Location: [place];  Service: Vascular;  Laterality: Left;  . Femoral-popliteal bypass graft  03/11/2012    Procedure: BYPASS GRAFT FEMORAL-POPLITEAL ARTERY;  Surgeon: Conrad Huntington Woods, MD;  Location: [place];  Service: Vascular;  Laterality: Left;  embolectomy left lower leg  . Amputation  03/17/2012    Procedure: AMPUTATION ABOVE KNEE;  Surgeon: Conrad Elkview, MD;  Location: [place];  Service: Vascular;  Laterality: Left;  . Coronary angioplasty with stent placement  05-01-2006    OCCLUDED CIRCUMFLEX -- TAXUS STENT PLACMENT  AND RESIDUAL 80% LAD,  50% RCA  . Amputation  06/03/2012    Procedure: AMPUTATION ABOVE KNEE;  Surgeon: Conrad Mackinaw, MD;  Location: [place];  Service: Vascular;   Laterality: Right;  . Lower extremity angiogram Bilateral 12/17/2011    Procedure: LOWER EXTREMITY ANGIOGRAM;  Surgeon: Serafina Mitchell, MD;  Location[facility] C[facility] LAB;  Service: Cardiovascular;  Laterality: Bilateral;  bil lower extrem angio  . Abdominal angiogram  12/17/2011    Procedure: ABDOMINAL ANGIOGRAM;  Surgeon: Serafina Mitchell, MD;  Location: [facility] CATH LAB;  Service: Cardiovascular;;  . Abdominal aortagram N/A 08/19/2013    Procedure: ABDOMINAL Maxcine Ham;  Surgeon: Conrad Swisher, MD;  Location: [facility] CATH LAB;  Service: Cardiovascular;  Laterality: N/A;    Family History  Problem Relation Age of Onset  . Hypertension    . Heart attack Mother 61  . Heart disease Mother   . Stroke Mother   . Hyperlipidemia Mother   . Hypertension Mother   . Colon cancer Neg Hx   . Prostate cancer Neg Hx   . COPD Father   . Peripheral vascular disease Father   . Diabetes Brother   . Heart disease Brother   . Hypertension Brother   . Heart attack Brother   . Diabetes Daughter     Social History:  reports that he quit smoking about 2 years ago. He quit smokeless tobacco use about 5 years ago. He reports that he does not drink alcohol or use illicit drugs.  Allergies:  Allergies  Allergen Reactions  . Bee Venom Anaphylaxis  . Influenza Vaccines Other (See Comments)    Sick for months    Medications: I have reviewed the patient's current medications.  Results for orders placed or performed during the hospital encounter of 08/18/14 (from the past 48 hour(s))  CBC with Differential     Status: Abnormal   Collection Time: 08/18/14 12:50 PM  Result Value Ref Range   WBC 19.9 (H) 4.0 - 10.5 K/uL   RBC 6.00 (H) 4.22 - 5.81 MIL/uL   Hemoglobin 16.1 13.0 - 17.0 g/dL   HCT 47.4 39.0 - 52.0 %   MCV 79.0 78.0 - 100.0 fL   MCH 26.8 26.0 - 34.0 pg   MCHC 34.0 30.0 - 36.0 g/dL   RDW 16.7 (H) 11.5 - 15.5 %   Platelets 256 150 - 400 K/uL   Neutrophils Relative % 87 (H) 43 - 77 %   Neutro Abs 17.3 (H) 1.7  - 7.7 K/uL   Lymphocytes Relative 6 (L) 12 - 46 %   Lymphs Abs 1.2 0.7 - 4.0 K/uL   Monocytes Relative 7 3 - 12 %   Monocytes Absolute 1.4 (H) 0.1 - 1.0 K/uL   Eosinophils Relative 0 0 - 5 %   Eosinophils Absolute 0.0 0.0 - 0.7 K/uL   Basophils Relative 0 0 - 1 %   Basophils Absolute 0.0 0.0 - 0.1  K/uL  Comprehensive metabolic panel     Status: Abnormal   Collection Time: 08/18/14 12:50 PM  Result Value Ref Range   Sodium 135 135 - 145 mmol/L   Potassium 4.3 3.5 - 5.1 mmol/L   Chloride 102 96 - 112 mmol/L   CO2 18 (L) 19 - 32 mmol/L   Glucose, Bld 311 (H) 70 - 99 mg/dL   BUN 21 6 - 23 mg/dL   Creatinine, Ser 1.25 0.50 - 1.35 mg/dL   Calcium 9.2 8.4 - 10.5 mg/dL   Total Protein 8.1 6.0 - 8.3 g/dL   Albumin 3.4 (L) 3.5 - 5.2 g/dL   AST 34 0 - 37 U/L   ALT 44 0 - 53 U/L   Alkaline Phosphatase 146 (H) 39 - 117 U/L   Total Bilirubin 1.4 (H) 0.3 - 1.2 mg/dL   GFR calc non Af Amer 56 (L) >90 mL/min   GFR calc Af Amer 65 (L) >90 mL/min    Comment: (NOTE) The eGFR has been calculated using the CKD EPI equation. This calculation has not been validated in all clinical situations. eGFR's persistently <90 mL/min signify possible Chronic Kidney Disease.    Anion gap 15 5 - 15  Lipase, blood     Status: None   Collection Time: 08/18/14 12:50 PM  Result Value Ref Range   Lipase 20 11 - 59 U/L  Troponin I  (only if pt is 73 y.o. or older and pain is above umbilicus)     Status: Abnormal   Collection Time: 08/18/14 12:50 PM  Result Value Ref Range   Troponin I 0.07 (H) <0.031 ng/mL    Comment:        PERSISTENTLY INCREASED TROPONIN VALUES IN THE RANGE OF 0.04-0.49 ng/mL CAN BE SEEN IN:       -UNSTABLE ANGINA       -CONGESTIVE HEART FAILURE       -MYOCARDITIS       -CHEST TRAUMA       -ARRYHTHMIAS       -LATE PRESENTING MYOCARDIAL INFARCTION       -COPD   CLINICAL FOLLOW-UP RECOMMENDED.   I-Stat CG4 Lactic Acid, ED     Status: Abnormal   Collection Time: 08/18/14 12:59 PM   Result Value Ref Range   Lactic Acid, Venous 3.62 (HH) 0.5 - 2.0 mmol/L   Comment NOTIFIED PHYSICIAN   I-stat chem 8, ed     Status: Abnormal   Collection Time: 08/18/14  1:40 PM  Result Value Ref Range   Sodium 135 135 - 145 mmol/L   Potassium 4.3 3.5 - 5.1 mmol/L   Chloride 103 96 - 112 mmol/L   BUN 26 (H) 6 - 23 mg/dL   Creatinine, Ser 1.10 0.50 - 1.35 mg/dL   Glucose, Bld 318 (H) 70 - 99 mg/dL   Calcium, Ion 1.06 (L) 1.13 - 1.30 mmol/L   TCO2 17 0 - 100 mmol/L   Hemoglobin 18.7 (H) 13.0 - 17.0 g/dL   HCT 55.0 (H) 39.0 - 52.0 %  Urinalysis, Routine w reflex microscopic     Status: Abnormal   Collection Time: 08/18/14  1:53 PM  Result Value Ref Range   Color, Urine AMBER (A) YELLOW    Comment: BIOCHEMICALS MAY BE AFFECTED BY COLOR   APPearance CLOUDY (A) CLEAR   Specific Gravity, Urine 1.024 1.005 - 1.030   pH 6.0 5.0 - 8.0   Glucose, UA NEGATIVE NEGATIVE mg/dL   Hgb urine dipstick SMALL (A)  NEGATIVE   Bilirubin Urine SMALL (A) NEGATIVE   Ketones, ur 40 (A) NEGATIVE mg/dL   Protein, ur >300 (A) NEGATIVE mg/dL   Urobilinogen, UA 1.0 0.0 - 1.0 mg/dL   Nitrite POSITIVE (A) NEGATIVE   Leukocytes, UA SMALL (A) NEGATIVE  Urine microscopic-add on     Status: Abnormal   Collection Time: 08/18/14  1:53 PM  Result Value Ref Range   Squamous Epithelial / LPF FEW (A) RARE   WBC, UA 21-50 <3 WBC/hpf   RBC / HPF 0-2 <3 RBC/hpf   Bacteria, UA MANY (A) RARE   Casts WBC CAST (A) NEGATIVE    Comment: GRANULAR CAST   Urine-Other MANY YEAST     Comment: MUCOUS PRESENT  I-Stat CG4 Lactic Acid, ED     Status: Abnormal   Collection Time: 08/18/14  2:46 PM  Result Value Ref Range   Lactic Acid, Venous 4.70 (HH) 0.5 - 2.0 mmol/L   Comment NOTIFIED PHYSICIAN     Ct Abdomen Pelvis W Contrast  08/18/2014   CLINICAL DATA:  Periumbilical and right upper quadrant pain for 2 days. Elevated white blood cell count and elevated lactic acid.  EXAM: CT ABDOMEN AND PELVIS WITH CONTRAST  TECHNIQUE:  Multidetector CT imaging of the abdomen and pelvis was performed using the standard protocol following bolus administration of intravenous contrast.  CONTRAST:  100 mL OMNIPAQUE IOHEXOL 300 MG/ML  SOLN  COMPARISON:  CT abdomen and pelvis 10/09/2010.  FINDINGS: Mild dependent atelectasis is present in the lung bases. Heart size is enlarged. There is calcific coronary artery disease.  The gallbladder is markedly dilated with wall thickening and surrounding inflammatory change. The liver is diffusely low attenuating with a 1.4 cm cyst on image 15 which is unchanged in appearance. The biliary tree, adrenal glands, spleen and pancreas appear normal. Bilateral renal cysts are noted.  The patient has aortoiliac atherosclerosis. The right common iliac artery is dilated at 1.8 cm. The left common iliac artery measures 1.5 cm. There is a large volume of stool in the cecum. The colon is otherwise unremarkable. The stomach and small bowel appear normal. No lymphadenopathy is identified. Small fat containing umbilical hernia is noted.  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 No other focal bony lesion is identified.  IMPRESSION: Findings highly suspicious for acute cholecystitis. Right upper quadrant ultrasound could be used for further evaluation.  Fatty infiltration of the liver.  Calcific aortic and coronary atherosclerosis. 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  Small fat containing umbilical hernia.  Avascular necrosis of the femoral heads bilaterally.   Electronically Signed   By: Inge Rise M.D.   On: 08/18/2014 16:23   Dg Chest Portable 1 View  08/18/2014   CLINICAL DATA:  Initial encounter for abdominal pain.  Ex-smoker.  EXAM: PORTABLE CHEST - 1 VIEW  COMPARISON:  12/16/2013  FINDINGS: Numerous leads and wires project over the chest. Midline trachea. Moderate cardiomegaly. No pleural effusion or  pneumothorax. mild subsegmental atelectasis at the left lung base. No gross free intraperitoneal air. Convex right thoracolumbar spine curvature.  IMPRESSION: Cardiomegaly, without acute disease.   Electronically Signed   By: Abigail Miyamoto M.D.   On: 08/18/2014 13:58    Review of Systems  All other systems reviewed and are negative.  Blood pressure 108/69, pulse 87, temperature 99.2 F (37.3 C), temperature source Rectal, resp. rate 22, height _0  (1.803  m), weight 155 lb (70.308 kg), SpO2 97 %. Physical Exam  Constitutional: He is oriented to person, place, and time. He appears well-developed and well-nourished. No distress.  HENT:  Head: Normocephalic and atraumatic.  Right Ear: External ear normal.  Left Ear: External ear normal.  Nose: Nose normal.  Eyes: Conjunctivae are normal. Pupils are equal, round, and reactive to light. No scleral icterus.  Neck: Normal range of motion. No tracheal deviation present.  Cardiovascular: Normal rate and regular rhythm.   Murmur heard. Respiratory: Effort normal and breath sounds normal. No respiratory distress. He has no wheezes.  GI: Soft. There is tenderness. There is guarding.  There is tenderness with guarding in the right upper quadrant  Musculoskeletal:  Lower extremity amputation  Neurological: He is alert and oriented to person, place, and time.  Psychiatric: His behavior is normal. Judgment normal.    Assessment/Plan: Acute cholecystitis, chronic urinary tract infection, and multiple chronic severe medical issues.  He is being admitted to the medical service. Given his overall status, he may not be a candidate for general anesthesia. This especially given his cardiac ejection fraction. I will order an ultrasound of his abdomen and will consult interventional radiology to consider possible percutaneous cholecystostomy tube placement tomorrow.  We will follow him closely with you  Vianca Bracher A 08/18/2014, 4:50 PM

## 2014-08-19 ENCOUNTER — Inpatient Hospital Stay (HOSPITAL_COMMUNITY): Payer: Medicare Other

## 2014-08-19 DIAGNOSIS — R072 Precordial pain: Secondary | ICD-10-CM

## 2014-08-19 LAB — CBC
HEMATOCRIT: 42.7 % (ref 39.0–52.0)
Hemoglobin: 14.6 g/dL (ref 13.0–17.0)
MCH: 26.5 pg (ref 26.0–34.0)
MCHC: 34.2 g/dL (ref 30.0–36.0)
MCV: 77.5 fL — ABNORMAL LOW (ref 78.0–100.0)
Platelets: 205 10*3/uL (ref 150–400)
RBC: 5.51 MIL/uL (ref 4.22–5.81)
RDW: 16.5 % — AB (ref 11.5–15.5)
WBC: 18 10*3/uL — AB (ref 4.0–10.5)

## 2014-08-19 LAB — TROPONIN I
Troponin I: 0.06 ng/mL — ABNORMAL HIGH (ref <0.031)
Troponin I: 0.06 ng/mL — ABNORMAL HIGH (ref <0.031)

## 2014-08-19 LAB — GLUCOSE, CAPILLARY
GLUCOSE-CAPILLARY: 188 mg/dL — AB (ref 70–99)
Glucose-Capillary: 61 mg/dL — ABNORMAL LOW (ref 70–99)
Glucose-Capillary: 120 mg/dL — ABNORMAL HIGH (ref 70–99)
Glucose-Capillary: 57 mg/dL — ABNORMAL LOW (ref 70–99)
Glucose-Capillary: 157 mg/dL — ABNORMAL HIGH (ref 70–99)
Glucose-Capillary: 202 mg/dL — ABNORMAL HIGH (ref 70–99)

## 2014-08-19 LAB — PROTIME-INR
INR: 1.45 (ref 0.00–1.49)
Prothrombin Time: 17.7 s — ABNORMAL HIGH (ref 11.6–15.2)

## 2014-08-19 LAB — COMPREHENSIVE METABOLIC PANEL
ALBUMIN: 2.8 g/dL — AB (ref 3.5–5.2)
ALT: 324 U/L — ABNORMAL HIGH (ref 0–53)
AST: 459 U/L — ABNORMAL HIGH (ref 0–37)
Alkaline Phosphatase: 219 U/L — ABNORMAL HIGH (ref 39–117)
Anion gap: 9 (ref 5–15)
BILIRUBIN TOTAL: 3.1 mg/dL — AB (ref 0.3–1.2)
BUN: 17 mg/dL (ref 6–23)
CHLORIDE: 107 mmol/L (ref 96–112)
CO2: 22 mmol/L (ref 19–32)
Calcium: 8.3 mg/dL — ABNORMAL LOW (ref 8.4–10.5)
Creatinine, Ser: 0.76 mg/dL (ref 0.50–1.35)
GFR calc non Af Amer: 89 mL/min — ABNORMAL LOW (ref >90)
Glucose, Bld: 72 mg/dL (ref 70–99)
Potassium: 3.7 mmol/L (ref 3.5–5.1)
Sodium: 138 mmol/L (ref 135–145)
Total Protein: 6.9 g/dL (ref 6.0–8.3)

## 2014-08-19 LAB — APTT: aPTT: 39 s — ABNORMAL HIGH (ref 24–37)

## 2014-08-19 MED ORDER — MEPERIDINE HCL 25 MG/ML IJ SOLN
25.0000 mg | Freq: Once | INTRAMUSCULAR | Status: AC
Start: 2014-08-19 — End: 2014-08-19
  Administered 2014-08-19: 25 mg via INTRAVENOUS

## 2014-08-19 MED ORDER — INSULIN ASPART 100 UNIT/ML ~~LOC~~ SOLN
0.0000 [IU] | SUBCUTANEOUS | Status: DC
  Administered 2014-08-19: 3 [IU] via SUBCUTANEOUS
  Administered 2014-08-20: 2 [IU] via SUBCUTANEOUS
  Administered 2014-08-20: 5 [IU] via SUBCUTANEOUS
  Administered 2014-08-20: 3 [IU] via SUBCUTANEOUS
  Administered 2014-08-20 (×4): 2 [IU] via SUBCUTANEOUS

## 2014-08-19 MED ORDER — FENTANYL CITRATE 0.05 MG/ML IJ SOLN
INTRAMUSCULAR | Status: AC
  Filled 2014-08-19: qty 4

## 2014-08-19 MED ORDER — DEXTROSE 50 % IV SOLN
INTRAVENOUS | Status: AC
  Administered 2014-08-19: 25 mL
  Filled 2014-08-19: qty 50

## 2014-08-19 MED ORDER — ASPIRIN 81 MG PO CHEW
81.0000 mg | CHEWABLE_TABLET | Freq: Every day | ORAL | Status: DC
  Administered 2014-08-19 – 2014-08-26 (×7): 81 mg via ORAL
  Filled 2014-08-19 (×9): qty 1

## 2014-08-19 MED ORDER — SODIUM CHLORIDE 0.9 % IV BOLUS (SEPSIS): 500.0000 mL | INTRAVENOUS | Status: DC | PRN

## 2014-08-19 MED ORDER — FENTANYL CITRATE 0.05 MG/ML IJ SOLN
INTRAMUSCULAR | Status: AC | PRN
  Administered 2014-08-19 (×2): 50 ug via INTRAVENOUS
  Administered 2014-08-19: 25 ug via INTRAVENOUS

## 2014-08-19 MED ORDER — MORPHINE SULFATE 4 MG/ML IJ SOLN
3.0000 mg | INTRAMUSCULAR | Status: DC | PRN
  Administered 2014-08-19 – 2014-08-25 (×6): 3 mg via INTRAVENOUS
  Filled 2014-08-19 (×5): qty 1

## 2014-08-19 MED ORDER — DEXTROSE-NACL 5-0.45 % IV SOLN
INTRAVENOUS | Status: DC
  Administered 2014-08-19 (×2): via INTRAVENOUS
  Administered 2014-08-20: 1000 mL via INTRAVENOUS

## 2014-08-19 MED ORDER — MEPERIDINE HCL 50 MG/ML IJ SOLN
INTRAMUSCULAR | Status: AC
  Filled 2014-08-19: qty 1

## 2014-08-19 MED ORDER — NITROGLYCERIN 0.4 MG SL SUBL: 0.4000 mg | SUBLINGUAL_TABLET | SUBLINGUAL | Status: DC | PRN

## 2014-08-19 MED ORDER — DEXTROSE 50 % IV SOLN
INTRAVENOUS | Status: AC
  Administered 2014-08-19: 50 mL
  Filled 2014-08-19: qty 50

## 2014-08-19 MED ORDER — VANCOMYCIN HCL IN DEXTROSE 750-5 MG/150ML-% IV SOLN
750.0000 mg | Freq: Two times a day (BID) | INTRAVENOUS | Status: DC
  Administered 2014-08-19 – 2014-08-21 (×4): 750 mg via INTRAVENOUS
  Filled 2014-08-19 (×5): qty 150

## 2014-08-19 MED ORDER — MIDAZOLAM HCL 2 MG/2ML IJ SOLN
INTRAMUSCULAR | Status: AC
  Filled 2014-08-19: qty 4

## 2014-08-19 MED ORDER — HEPARIN SODIUM (PORCINE) 5000 UNIT/ML IJ SOLN
5000.0000 [IU] | Freq: Three times a day (TID) | INTRAMUSCULAR | Status: AC
  Administered 2014-08-19 – 2014-08-24 (×14): 5000 [IU] via SUBCUTANEOUS
  Filled 2014-08-19 (×16): qty 1

## 2014-08-19 MED ORDER — IOHEXOL 300 MG/ML  SOLN
50.0000 mL | Freq: Once | INTRAMUSCULAR | Status: AC | PRN
  Administered 2014-08-19: 20 mL

## 2014-08-19 MED ORDER — LIDOCAINE HCL 1 % IJ SOLN
INTRAMUSCULAR | Status: AC
  Filled 2014-08-19: qty 20

## 2014-08-19 MED ORDER — LEVOFLOXACIN IN D5W 500 MG/100ML IV SOLN
500.0000 mg | INTRAVENOUS | Status: DC
  Administered 2014-08-19: 500 mg via INTRAVENOUS
  Filled 2014-08-19 (×3): qty 100

## 2014-08-19 MED ORDER — SODIUM CHLORIDE 0.9 % IV SOLN: INTRAVENOUS | Status: DC

## 2014-08-19 NOTE — Progress Notes (Signed)
UR Completed.  [phone]  

## 2014-08-19 NOTE — Progress Notes (Signed)
Subjective: Patient reports less abdominal pain Denies SOB  Objective: Vital signs in last 24 hours: Temp:  [98.6 F (37 C)-100.8 F (38.2 C)] 100.8 F (38.2 C) (01/29 0400) Pulse Rate:  [80-109] 105 (01/29 0400) Resp:  [18-30] 24 (01/29 0400) BP: (87-144)/(68-86) 136/86 mmHg (01/29 0400) SpO2:  [87 %-100 %] 87 % (01/29 0400) Weight:  [146 lb 14.4 oz (66.633 kg)-155 lb (70.308 kg)] 146 lb 14.4 oz (66.633 kg) (01/28 1759) Last BM Date: 08/16/14  Intake/Output from previous day: 01/28 0701 - 01/29 0700 In: 3250 [I.V.:3000; IV Piggyback:250] Out: 800 [Urine:800] Intake/Output this shift:    Abdomen soft but with moderate to severe RUQ tenderness and guarding  Lab Results:   Recent Labs  08/18/14 1250 08/18/14 1340 08/19/14 0643  WBC 19.9*  --  18.0*  HGB 16.1 18.7* 14.6  HCT 47.4 55.0* 42.7  PLT 256  --  205   BMET  Recent Labs  08/18/14 1250 08/18/14 1340 08/19/14 0643  NA 135 135 138  K 4.3 4.3 3.7  CL 102 103 107  CO2 18*  --  22  GLUCOSE 311* 318* 72  BUN 21 26* 17  CREATININE 1.25 1.10 0.76  CALCIUM 9.2  --  8.3*   PT/INR No results for input(s): LABPROT, INR in the last 72 hours. ABG No results for input(s): PHART, HCO3 in the last 72 hours.  Invalid input(s): PCO2, PO2  Studies/Results: Ct Abdomen Pelvis W Contrast  08/18/2014   CLINICAL DATA:  Periumbilical and right upper quadrant pain for 2 days. Elevated white blood cell count and elevated lactic acid.  EXAM: CT ABDOMEN AND PELVIS WITH CONTRAST  TECHNIQUE: Multidetector CT imaging of the abdomen and pelvis was performed using the standard protocol following bolus administration of intravenous contrast.  CONTRAST:  100 mL OMNIPAQUE IOHEXOL 300 MG/ML  SOLN  COMPARISON:  CT abdomen and pelvis 10/09/2010.  FINDINGS: Mild dependent atelectasis is present in the lung bases. Heart size is enlarged. There is calcific coronary artery disease.  The gallbladder is markedly dilated with wall thickening  and surrounding inflammatory change. The liver is diffusely low attenuating with a 1.4 cm cyst on image 15 which is unchanged in appearance. The biliary tree, adrenal glands, spleen and pancreas appear normal. Bilateral renal cysts are noted.  The patient has aortoiliac atherosclerosis. The right common iliac artery is dilated at 1.8 cm. The left common iliac artery measures 1.5 cm. There is a large volume of stool in the cecum. The colon is otherwise unremarkable. The stomach and small bowel appear normal. No lymphadenopathy is identified. Small fat containing umbilical hernia is noted.  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 No other focal bony lesion is identified.  IMPRESSION: Findings highly suspicious for acute cholecystitis. Right upper quadrant ultrasound could be used for further evaluation.  Fatty infiltration of the liver.  Calcific aortic and coronary atherosclerosis. 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  Small fat containing umbilical hernia.  Avascular necrosis of the femoral heads bilaterally.   Electronically Signed   By: Inge Rise M.D.   On: 08/18/2014 16:23   Dg Chest Portable 1 View  08/18/2014   CLINICAL DATA:  Initial encounter for abdominal pain.  Ex-smoker.  EXAM: PORTABLE CHEST - 1 VIEW  COMPARISON:  12/16/2013  FINDINGS: Numerous leads and wires project over the chest. Midline trachea. Moderate cardiomegaly. No pleural effusion or pneumothorax. mild  subsegmental atelectasis at the left lung base. No gross free intraperitoneal air. Convex right thoracolumbar spine curvature.  IMPRESSION: Cardiomegaly, without acute disease.   Electronically Signed   By: Abigail Miyamoto M.D.   On: 08/18/2014 13:58   US Abdomen Limited Ruq  08/19/2014   CLINICAL DATA:  Right upper quadrant abdominal pain.  EXAM: US ABDOMEN LIMITED - RIGHT UPPER QUADRANT  COMPARISON:  None.  FINDINGS: Gallbladder:   Gallbladder is moderately distended. Multiple stones and sludge are demonstrated in the gallbladder. Largest stone measures about 8 mm diameter. Focal area of mild gallbladder wall thickening up to 3.3 mm. Murphy's sign is negative.  Common bile duct:  Diameter: 6.3 mm, upper limits normal. Mild intrahepatic bile duct dilatation is suggested. No filling defects are demonstrated although portions of the bowel duct are obscured by bowel gas.  Liver:  With diffusely increased coarsened liver echotexture likely representing fatty infiltration. No focal lesion definitively identified.  IMPRESSION: Cholelithiasis and gallbladder sludge with mild gallbladder wall thickening and distention. Changes may indicate acute cholecystitis although Murphy's sign is negative. Mild intrahepatic bile duct dilatation of nonspecific etiology.   Electronically Signed   By: Lucienne Capers M.D.   On: 08/19/2014 06:08    Anti-infectives: Anti-infectives    Start     Dose/Rate Route Frequency Ordered Stop   08/19/14 0300  vancomycin (VANCOCIN) IVPB 750 mg/150 ml premix     750 mg150 mL/hr over 60 Minutes Intravenous Every 12 hours 08/18/14 1359     08/18/14 2000  piperacillin-tazobactam (ZOSYN) IVPB 3.375 g     3.375 g12.5 mL/hr over 240 Minutes Intravenous Every 8 hours 08/18/14 1359     08/18/14 1315  piperacillin-tazobactam (ZOSYN) IVPB 3.375 g     3.375 g100 mL/hr over 30 Minutes Intravenous  Once 08/18/14 1300 08/18/14 1425   08/18/14 1315  vancomycin (VANCOCIN) IVPB 1000 mg/200 mL premix     1,000 mg200 mL/hr over 60 Minutes Intravenous  Once 08/18/14 1300 08/18/14 1527      Assessment/Plan:  Cholecystitis with possible choledocholithiasis, cholangitis  I recommend IR placing a percutaneous cholecystostomy tube.  I think that currently he is a poor operative candidate for general anesthesia.  I also recommend a GI consult incase his LFT's continue to rise as he may need an ERCP. Continuing IV antibiotics  LOS:  1 day    Ozzie Remmers A 08/19/2014

## 2014-08-19 NOTE — Sedation Documentation (Signed)
Pt is awake/alert and verbal. Denies any pain or discomfort at this time and is to return to his room assisted by this RN.

## 2014-08-19 NOTE — Progress Notes (Signed)
CRITICAL VALUE ALERT  Critical value received:  Positive blood culture   Date of notification:  08/19/2014  Time of notification:  2:42PM  Critical value read back:Yes.    Nurse who received alert:  Charm Barges  MD notified (1st page):  Ronnie Derby  Time of first page:  2:43PM  MD notified (2nd page):  Time of second page:  Responding MD:  Ronnie Derby  Time MD responded:  2:43PM (with orders)

## 2014-08-19 NOTE — Sedation Documentation (Signed)
Pt brought to NS station for recovery.

## 2014-08-19 NOTE — Sedation Documentation (Signed)
Patient denies pain and is resting comfortably.  

## 2014-08-19 NOTE — Progress Notes (Signed)
ANTIBIOTIC CONSULT NOTE - FOLLOW UP  Pharmacy Consult for vancomycin and Zosyn Indication: sepsis, acute cholecystitis  Allergies  Allergen Reactions  . Bee Venom Anaphylaxis  . Influenza Vaccines Other (See Comments)    Sick for months    Patient Measurements: Height: 5\' 11"  (180.3 cm) Weight: 146 lb 14.4 oz (66.633 kg) IBW/kg (Calculated) : 75.3  Vital Signs: Temp: 100.9 F (38.3 C) (01/29 1204) Temp Source: Oral (01/29 1204) BP: 140/61 mmHg (01/29 1204) Pulse Rate: 102 (01/29 1204) Intake/Output from previous day: 01/28 0701 - 01/29 0700 In: 3250 [I.V.:3000; IV Piggyback:250] Out: 800 [Urine:800] Intake/Output from this shift:    Labs:  Recent Labs  08/18/14 1250 08/18/14 1340 08/19/14 0643  WBC 19.9*  --  18.0*  HGB 16.1 18.7* 14.6  PLT 256  --  205  CREATININE 1.25 1.10 0.76   Estimated Creatinine Clearance: 78.6 mL/min (by C-G formula based on Cr of 0.76). No results for input(s): VANCOTROUGH, VANCOPEAK, VANCORANDOM, GENTTROUGH, GENTPEAK, GENTRANDOM, TOBRATROUGH, TOBRAPEAK, TOBRARND, AMIKACINPEAK, AMIKACINTROU, AMIKACIN in the last 72 hours.   Microbiology: Recent Results (from the past 720 hour(s))  Blood Culture (routine x 2)     Status: None (Preliminary result)   Collection Time: 08/18/14  1:22 PM  Result Value Ref Range Status   Specimen Description BLOOD LEFT ANTECUBITAL  Final   Special Requests BOTTLES DRAWN AEROBIC AND ANAEROBIC 10CC  Final   Culture   Final           BLOOD CULTURE RECEIVED NO GROWTH TO DATE CULTURE WILL BE HELD FOR 5 DAYS BEFORE ISSUING A FINAL NEGATIVE REPORT Performed at [facility]    Report Status PENDING  Incomplete  Blood Culture (routine x 2)     Status: None (Preliminary result)   Collection Time: 08/18/14  1:27 PM  Result Value Ref Range Status   Specimen Description BLOOD HAND LEFT  Final   Special Requests BOTTLES DRAWN AEROBIC ONLY 5CC  Final   Culture   Final    GRAM POSITIVE COCCI IN CLUSTERS Note:  Gram Stain Report Called to,Read Back By and Verified With: ALEX SOLOMON 08/19/14 1435 BY SMITHERSJ Performed at [facility]    Report Status PENDING  Incomplete  MRSA PCR Screening     Status: None   Collection Time: 08/18/14  6:02 PM  Result Value Ref Range Status   MRSA by PCR NEGATIVE NEGATIVE Final    Comment:        The GeneXpert MRSA Assay (FDA approved for NASAL specimens only), is one component of a comprehensive MRSA colonization surveillance program. It is not intended to diagnose MRSA infection nor to guide or monitor treatment for MRSA infections.     Anti-infectives    Start     Dose/Rate Route Frequency Ordered Stop   08/19/14 1500  vancomycin (VANCOCIN) IVPB 750 mg/150 ml premix     750 mg150 mL/hr over 60 Minutes Intravenous Every 12 hours 08/19/14 1450     08/19/14 0300  vancomycin (VANCOCIN) IVPB 750 mg/150 ml premix  Status:  Discontinued     750 mg150 mL/hr over 60 Minutes Intravenous Every 12 hours 08/18/14 1359 08/19/14 1122   08/18/14 2000  piperacillin-tazobactam (ZOSYN) IVPB 3.375 g     3.375 g12.5 mL/hr over 240 Minutes Intravenous Every 8 hours 08/18/14 1359     08/18/14 1315  piperacillin-tazobactam (ZOSYN) IVPB 3.375 g     3.375 g100 mL/hr over 30 Minutes Intravenous  Once 08/18/14 1300 08/18/14 1425  08/18/14 1315  vancomycin (VANCOCIN) IVPB 1000 mg/200 mL premix     1,000 mg200 mL/hr over 60 Minutes Intravenous  Once 08/18/14 1300 08/18/14 1527      Assessment: 25 yom presented to the ED with abdominal pain found to have acute cholecystitis with perc drain to be placed. Deemed poor surgical candidate. One of two blood cultures resulted with GPC chains. Pharmacy consulted to add vancomycin back on to r/o bacteremia. He continues on Zosyn. He remain febrile, WBC down to 18. Last dose of vancomycin was 03:45 this morning.  Vanc 1/28>> Zosyn 1/28>>   1/28 BCx2 - 1of2 GPC chains  Goal of Therapy:  Vancomycin trough level 15-20  mcg/ml  Plan:  - Resume vancomycin 750 mg IV q12h  - Continue Zosyn 3.375 g IV q8h to be infused over 4 hours - Monitor renal function and culture data  [facility], Pharm.D., BCPS Clinical Pharmacist Pager: [phone] 08/19/2014 2:52 PM

## 2014-08-19 NOTE — Procedures (Signed)
Interventional Radiology Procedure Note  Procedure: [place] / Fluoro guided percutaneous cholecystostomy placement. 56F drain into the gallbladder.  Complications: No immediate Recommendations:  - Routine care  -follow up cultures  Signed,  Dulcy Fanny. Earleen Newport, DO

## 2014-08-19 NOTE — Progress Notes (Signed)
  Echocardiogram 2D Echocardiogram has been performed.  Jaime [name] 08/19/2014, 2:51 PM

## 2014-08-19 NOTE — Consult Note (Signed)
[facility] Consult: 12:26 PM 08/19/2014  LOS: 1 day    Referring Provider: Dr. Candiss Norse  Primary Care Physician:  [name], MD Primary Gastroenterologist:  Dr. Ardis Hughs.     Reason for Consultation:  Evaluate for possible choledocholithiasis.    HPI: Jaime [name] is a 74 y.o. male.  Past medical history of vascular disease of the coronary arteries as well as peripheral vascular disease. He is status post lower extremity bypass grafting, s/p bil AKAs, Taxus DES placed in 2007.  Ischemic cardiomyopathy with EF 30-35% on echo of 02/2014.  IDDM.  Multiple sclerosis and in the wheelchair chronically.  History of adenomatous colon polyps in 2007. On repeat 10/2009 colonoscopy there was a diminutive transverse polyp( pathology showed fragments of vegetable matter) as well as a few scattered diverticula.  On 08/13/14 dopplers or aorta and vessels there was decreased visualization of the abdominal vasculature or due to overlying bowel gas. Conclusion of study was there was no evidence for hemodynamically significant stenosis in the aortoiliac system   Patient admitted yesterday afternoon with abdominal pain which had began the previous morning. He was vomiting nonbloody material. Was unable to keep anything on his stomach without vomiting. Stools performed. Temperature was 100.7. Systolic blood pressure was 80 in the emergency room but improved following IV fluids. CT scan of abdomen and pelvis with contrast 08/18/2014 suggested findings highly suspicious for acute cholecystitis, fatty liver, small fat-containing umbilical hernia, calcific changes of the aorta and coronary vessels with common iliac artery aneurysms, bilateral femoral head avascular necrosis.  Biliary tree, liver and pancreas show no pathology. Abdominal ultrasound  08/19/14 shows cholelithiasis, GB sludge, mild GB wall thickening and distention. CBD is 6.3 mm which is upper limits of normal. Mild intrahepatic duct dilatation. No choledocholithiasis seen.   Dr. [name] of general surgery evaluated the patient and has ordered placement of percutaneous cholecystostomy tube.   This should get done by this afternoon.  Initial AST/ALT 34/44, today these are 459/324. Alkaline phosphatase has gone from 146 to 219.  T bili went from 1.4 - 3.1.  Lipase on admission 20, has not been repeated. History prominence are elevated but he has nonspecific EKG changes. Cardiology has attributed this due to sepsis related demand ischemia.. No formal consultation with cardiology required.  Prior to recent days events, the patient had no GI issues according to his wife.     Past Medical History  Diagnosis Date  . Hyperlipidemia   . Cardiomyopathy, ischemic     EF 45% per ECHO 2008  //   EF 25%, echo, August, 2013 //  Echo (8/15):  Mild LVH, EF 30-35%, ant-lat and lat AK, inf HK, Gr 1 DD, mild MR, mild LAE  . Pulmonary hypertension     moderate ECHO Jan 2008  . Systolic heart failure   . Multiple sclerosis [place] -- LAST VISIT 11-20-2010  NOTE W/ CHART  . Lung nodule     resolved 11-2006 CT Chest  . Tobacco abuse     quit   .  Increased prostate specific antigen (PSA) velocity   . Urinary retention     dx ~ 2-12, like from [place], now with a catheter, saw urology  . Carotid artery disease     a.  Doppler, February, 2012, 0-39% bilateral,Mild smooth plaque;  b.  Carotid US (8/15):  Bilateral 1-39% ICA >>> F/u 2 years  . Chronic indwelling Foley catheter   . Scoliosis associated with other condition   . Hemiparesis   . Fatigue SEVERE  . Impotence   . MI, acute, non ST segment elevation 2007    S/P PCI WITH X1 STENT (TAXUS DRUG-ELUTING) LEFT CIRCUMFLEX  . Diabetes mellitus     INSULIN-DEPENDANT  . Hypertension   . H/O pleural effusion 2008     POST THORACENTESIS  . History of colon polyps PRECANCEROUS  . Insomnia   . CAD (coronary artery disease) CARDIOLOGIST- DR KATZ-- VISIT 06-05-2011 IN [facility]    non-STEMI, 2007.Marland Kitchenoccluded circumflex.. Taxus stent placed...residual 80% LAD...50% RCA  . Urinary tract infection     hx of  . Atherosclerotic PVD with ulceration     left foot  . PAC (premature atrial contraction)     December, 2013    Past Surgical History  Procedure Laterality Date  . Hernia repair  1990    (R)  . Thoracentesis  2008    PLEURAL EFFUSION  . Cystoscopy  07/30/2011    Procedure: CYSTOSCOPY;  Surgeon: Hanley Ben, MD;  Location: [facility];  Service: Urology;  Laterality: N/A;  . Transurethral resection of prostate  07/30/2011    Procedure: TRANSURETHRAL RESECTION OF THE PROSTATE (TURP);  Surgeon: Hanley Ben, MD;  Location: [facility];  Service: Urology;  Laterality: N/A;  . Femoral-tibial bypass graft  03/11/2012    Procedure: BYPASS GRAFT FEMORAL-TIBIAL ARTERY;  Surgeon: Conrad Rote, MD;  Location: [place];  Service: Vascular;  Laterality: Left;  Left Femoral -Tibial trunk bypass, Endarterectomy of Tibial- Peroneal trunk with vein angioplasty.  . Intraoperative arteriogram  03/11/2012    Procedure: INTRA OPERATIVE ARTERIOGRAM;  Surgeon: Conrad Swisher, MD;  Location: [place];  Service: Vascular;  Laterality: Left;  . Femoral-popliteal bypass graft  03/11/2012    Procedure: BYPASS GRAFT FEMORAL-POPLITEAL ARTERY;  Surgeon: Conrad Sandusky, MD;  Location: [place];  Service: Vascular;  Laterality: Left;  embolectomy left lower leg  . Amputation  03/17/2012    Procedure: AMPUTATION ABOVE KNEE;  Surgeon: Conrad Hope, MD;  Location: [place];  Service: Vascular;  Laterality: Left;  . Coronary angioplasty with stent placement  05-01-2006    OCCLUDED CIRCUMFLEX -- TAXUS STENT PLACMENT  AND RESIDUAL 80% LAD,  50% RCA  . Amputation  06/03/2012    Procedure: AMPUTATION ABOVE KNEE;  Surgeon: Conrad Angola, MD;  Location: [place];  Service: Vascular;  Laterality: Right;  . Lower extremity angiogram Bilateral 12/17/2011    Procedure: LOWER EXTREMITY ANGIOGRAM;  Surgeon: Serafina Mitchell, MD;  Location: [facility] CATH LAB;  Service: Cardiovascular;  Laterality: Bilateral;  bil lower extrem angio  . Abdominal angiogram  12/17/2011    Procedure: ABDOMINAL ANGIOGRAM;  Surgeon: Serafina Mitchell, MD;  Location: [facility] CATH LAB;  Service: Cardiovascular;;  . Abdominal aortagram N/A 08/19/2013    Procedure: ABDOMINAL Maxcine Ham;  Surgeon: Conrad , MD;  Location: [facility] CATH LAB;  Service: Cardiovascular;  Laterality: N/A;    Prior to Admission medications   Medication Sig Start Date End Date Taking? Authorizing Provider  acetaminophen-codeine (TYLENOL #  4) 300-60 MG per tablet Take 1 tablet by mouth 3 (three) times daily. Patient taking differently: Take 1 tablet by mouth 2 (two) times daily as needed for moderate pain or severe pain.  05/31/14  Yes Charlett Blake, MD  aspirin EC 325 MG tablet Take 325 mg by mouth daily.   Yes Historical Provider, MD  atorvastatin (LIPITOR) 40 MG tablet TAKE 1 TABLET (40 MG TOTAL) BY MOUTH AT BEDTIME.   Yes Colon Branch, MD  baclofen (LIORESAL) 10 MG tablet TAKE 0.5 TABLETS (5 MG TOTAL) BY MOUTH 2 (TWO) TIMES DAILY 05/31/14  Yes Charlett Blake, MD  benazepril (LOTENSIN) 10 MG tablet TAKE 1 TABLET BY MOUTH EVERY DAY   Yes Colon Branch, MD  diclofenac sodium (VOLTAREN) 1 % GEL Apply 1 application topically 2 (two) times daily as needed (pain). For pain 03/09/12  Yes Charlett Blake, MD  insulin aspart protamine- aspart (NOVOLOG MIX 70/30) (70-30) 100 UNIT/ML injection Inject 15-45 Units into the skin 2 (two) times daily with a meal. Take 45 units in the morning with breakfast and take 15 units at supper   Yes Historical Provider, MD  metoprolol succinate (TOPROL-XL) 25 MG 24 hr tablet TAKE 1.5 TABLETS (37.5 MG TOTAL) BY MOUTH DAILY.   Yes Colon Branch, MD  pantoprazole (PROTONIX) 40 MG  tablet TAKE 1 TABLET BY MOUTH EVERY DAY AT 12 NOON 07/25/14  Yes Colon Branch, MD  gabapentin (NEURONTIN) 300 MG capsule Take 2 capsules (600 mg total) by mouth 3 (three) times daily. Patient not taking: Reported on 08/18/2014 02/11/14   Dudley Major, DO  glucagon (GLUCAGEN) 1 MG SOLR injection Inject 1 mg into the vein once as needed for low blood sugar.    Historical Provider, MD  mupirocin ointment (BACTROBAN) 2 % Apply 1 application topically 2 (two) times daily. Patient not taking: Reported on 08/18/2014 05/25/14   Colon Branch, MD    Scheduled Meds: . aspirin  81 mg Oral Daily  . baclofen  5 mg Oral BID  . heparin  5,000 Units Subcutaneous 3 times per day  . insulin aspart  0-9 Units Subcutaneous Q4H  . pantoprazole (PROTONIX) IV  40 mg Intravenous Q24H  . piperacillin-tazobactam (ZOSYN)  IV  3.375 g Intravenous Q8H   Infusions: . sodium chloride Stopped (08/19/14 1154)   PRN Meds: acetaminophen-codeine, alum & mag hydroxide-simeth, morphine injection, [DISCONTINUED] ondansetron **OR** ondansetron (ZOFRAN) IV, sodium chloride   Allergies as of 08/18/2014 - Review Complete 08/18/2014  Allergen Reaction Noted  . Bee venom Anaphylaxis 12/11/2011  . Influenza vaccines Other (See Comments) 05/25/2014    Family History  Problem Relation Age of Onset  . Hypertension    . Heart attack Mother 27  . Heart disease Mother   . Stroke Mother   . Hyperlipidemia Mother   . Hypertension Mother   . Colon cancer Neg Hx   . Prostate cancer Neg Hx   . COPD Father   . Peripheral vascular disease Father   . Diabetes Brother   . Heart disease Brother   . Hypertension Brother   . Heart attack Brother   . Diabetes Daughter     History   Social History  . Marital Status: Married    Spouse Name: N/A    Number of Children: 2  . Years of Education: N/A   Occupational History  . disable    Social History Main Topics  . Smoking status: Former Smoker -- 80  years    Quit date: 07/22/2012    . Smokeless tobacco: Former Systems developer    Quit date: 08/05/2009     Comment: pt states that he is using E-cigs  . Alcohol Use: No  . Drug Use: No  . Sexual Activity: No     Comment: electronic cigarettes   Other Topics Concern  . Not on file   Social History Narrative   Lives w/ wife    REVIEW OF SYSTEMS: Constitutional:   Weight is stable. Requires Hoyer lift for transfer from bed to wheelchair. ENT:  No nose bleeds Pulm:   No shortness of breath, no cough CV:  No palpitations, no LE edema.  GU:  No hematuria, no frequency GI:  Per HPI Heme:   No issues with unusually excessive bleeding or bruising.   Transfusions:   None Neuro:  No headaches, no peripheral tingling or numbness.  No confusion or mental status changes  Derm:  No itching, no rash or sores.  Endocrine:  No sweats or chills.  No polyuria or dysuria.  Sugars at home normally in low to mid 100s, into 200s yesterday PTA as he was becoming ill.  Immunization:   Pneumovax 10/2013, do not see a documented flu shot. Travel:  None beyond local counties in last few months.    PHYSICAL EXAM: Vital signs in last 24 hours: Filed Vitals:   08/19/14 1204  BP: 140/61  Pulse: 102  Temp: 100.9 F (38.3 C)  Resp: 25   Wt Readings from Last 3 Encounters:  08/18/14 146 lb 14.4 oz (66.633 kg)  08/19/13 148 lb (67.132 kg)  08/13/13 148 lb (67.132 kg)    General:  pleasant, nontoxic but sleepy AAM. He is comfortable Head:   No facial asymmetry or swelling.  Eyes:   No scleral icterus. No conjunctival pallor Ears:   Hearing intact  Nose:   No discharge Mouth:   Clear, moist. Most of his teeth are missing. No lesions or exudates Neck:   No JVD, no bruits, no TMG, no masses Lungs:   Clear bilaterally to auscultation. No cough, no wet vocal quality, no dyspnea Heart:  RRR. No MRG. S1/S2 audible Abdomen:   Soft, NT, ND.   Bowel sounds hypoactive. No tinkling or tympanitic bowel sounds. No masses. No organomegaly Rectal:   deferred   Musc/Skeltl:  no joint swelling, contractures or erythema. Status post bilateral AKAs Extremities:   No upper extremity edema  Neurologic:   Oriented 3. Sleepy but easily aroused. No tremor, no weakness of the upper extremities. Skin:   No rash, no telangiectasia, no jaundice Tattoos:   None seen Nodes:   No cervical adenopathy   Psych:   Pleasant, cooperative. Demeanor subdued.  Intake/Output from previous day: 01/28 0701 - 01/29 0700 In: 3250 [I.V.:3000; IV Piggyback:250] Out: 800 [Urine:800] Intake/Output this shift:    LAB RESULTS:  Recent Labs  08/18/14 1250 08/18/14 1340 08/19/14 0643  WBC 19.9*  --  18.0*  HGB 16.1 18.7* 14.6  HCT 47.4 55.0* 42.7  PLT 256  --  205   BMET Lab Results  Component Value Date   NA 138 08/19/2014   NA 135 08/18/2014   NA 135 08/18/2014   K 3.7 08/19/2014   K 4.3 08/18/2014   K 4.3 08/18/2014   CL 107 08/19/2014   CL 103 08/18/2014   CL 102 08/18/2014   CO2 22 08/19/2014   CO2 18* 08/18/2014   CO2 23 02/21/2014   GLUCOSE 72  08/19/2014   GLUCOSE 318* 08/18/2014   GLUCOSE 311* 08/18/2014   BUN 17 08/19/2014   BUN 26* 08/18/2014   BUN 21 08/18/2014   CREATININE 0.76 08/19/2014   CREATININE 1.10 08/18/2014   CREATININE 1.25 08/18/2014   CALCIUM 8.3* 08/19/2014   CALCIUM 9.2 08/18/2014   CALCIUM 8.8 02/21/2014   LFT  Recent Labs  08/18/14 1250 08/19/14 0643  PROT 8.1 6.9  ALBUMIN 3.4* 2.8*  AST 34 459*  ALT 44 324*  ALKPHOS 146* 219*  BILITOT 1.4* 3.1*   PT/INR Lab Results  Component Value Date   INR 1.45 08/19/2014   INR 0.98 06/02/2012   INR 1.04 03/10/2012   Hepatitis Panel No results for input(s): HEPBSAG, HCVAB, HEPAIGM, HEPBIGM in the last 72 hours. C-Diff No components found for: CDIFF Lipase     Component Value Date/Time   LIPASE 20 08/18/2014 1250    Drugs of Abuse  No results found for: LABOPIA, COCAINSCRNUR, LABBENZ, AMPHETMU, THCU, LABBARB   RADIOLOGY STUDIES: Ct Abdomen  Pelvis W Contrast  08/18/2014   CLINICAL DATA:  Periumbilical and right upper quadrant pain for 2 days. Elevated white blood cell count and elevated lactic acid.  EXAM: CT ABDOMEN AND PELVIS WITH CONTRAST  TECHNIQUE: Multidetector CT imaging of the abdomen and pelvis was performed using the standard protocol following bolus administration of intravenous contrast.  CONTRAST:  100 mL OMNIPAQUE IOHEXOL 300 MG/ML  SOLN  COMPARISON:  CT abdomen and pelvis 10/09/2010.  FINDINGS: Mild dependent atelectasis is present in the lung bases. Heart size is enlarged. There is calcific coronary artery disease.  The gallbladder is markedly dilated with wall thickening and surrounding inflammatory change. The liver is diffusely low attenuating with a 1.4 cm cyst on image 15 which is unchanged in appearance. The biliary tree, adrenal glands, spleen and pancreas appear normal. Bilateral renal cysts are noted.  The patient has aortoiliac atherosclerosis. The right common iliac artery is dilated at 1.8 cm. The left common iliac artery measures 1.5 cm. There is a large volume of stool in the cecum. The colon is otherwise unremarkable. The stomach and small bowel appear normal. No lymphadenopathy is identified. Small fat containing umbilical hernia is noted.  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 No other focal bony lesion is identified.  IMPRESSION: Findings highly suspicious for acute cholecystitis. Right upper quadrant ultrasound could be used for further evaluation.  Fatty infiltration of the liver.  Calcific aortic and coronary atherosclerosis. 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  Small fat containing umbilical hernia.  Avascular necrosis of the femoral heads bilaterally.   Electronically Signed   By: Inge Rise M.D.   On: 08/18/2014 16:23   Dg Chest Portable 1 View  08/18/2014   CLINICAL DATA:  Initial encounter for  abdominal pain.  Ex-smoker.  EXAM: PORTABLE CHEST - 1 VIEW  COMPARISON:  12/16/2013  FINDINGS: Numerous leads and wires project over the chest. Midline trachea. Moderate cardiomegaly. No pleural effusion or pneumothorax. mild subsegmental atelectasis at the left lung base. No gross free intraperitoneal air. Convex right thoracolumbar spine curvature.  IMPRESSION: Cardiomegaly, without acute disease.   Electronically Signed   By: Abigail Miyamoto M.D.   On: 08/18/2014 13:58   US Abdomen Limited Ruq  08/19/2014   CLINICAL DATA:  Right upper quadrant abdominal pain.  EXAM: US ABDOMEN LIMITED - RIGHT UPPER QUADRANT  COMPARISON:  None.  FINDINGS: Gallbladder:  Gallbladder is moderately distended. Multiple stones and sludge are demonstrated in the gallbladder. Largest stone measures about 8 mm diameter. Focal area of mild gallbladder wall thickening up to 3.3 mm. Murphy's sign is negative.  Common bile duct:  Diameter: 6.3 mm, upper limits normal. Mild intrahepatic bile duct dilatation is suggested. No filling defects are demonstrated although portions of the bowel duct are obscured by bowel gas.  Liver:  With diffusely increased coarsened liver echotexture likely representing fatty infiltration. No focal lesion definitively identified.  IMPRESSION: Cholelithiasis and gallbladder sludge with mild gallbladder wall thickening and distention. Changes may indicate acute cholecystitis although Murphy's sign is negative. Mild intrahepatic bile duct dilatation of nonspecific etiology.   Electronically Signed   By: Lucienne Capers M.D.   On: 08/19/2014 06:08    ENDOSCOPIC STUDIES: Per HPI  IMPRESSION:   *  Cholecystitis with possible cholangitis.  *  Choledocholithiasis?  On CT scan there is no biliary ductal dilatation, ultrasound appearance of bile duct is upper limits of normal. Neither study demonstrates CBD stones or masses. However LFTs have become acutely elevated  *  Multiple sclerosis. Wheelchair bound due to  this as well as bilateral AKA.  *  Peripheral vascular disease.  *  IDDM.  Sugars acutely elevated.  At home sugars normally well controlled.       PLAN:     *  Discussed case with Dr Corrie Mckusick of IR.  It is unlikely he can safely obtain cholangiogram at time of perc GB drain placement.    Azucena Freed  08/19/2014, 12:26 PM Pager: [phone]     ________________________________________________________________________  Velora Heckler GI MD note:  I personally examined the patient, reviewed the data and agree with the assessment and plan described above.  He is currently getting perc chole drain placed.  After 2-3 days will be possible to use the drain to perform cholangiogram to interrogate for CBD stones, pathology.  Will follow along.   Owens Loffler, MD [facility] Pager [phone]

## 2014-08-19 NOTE — Consult Note (Signed)
Chief Complaint: Chief Complaint  Patient presents with  . Abdominal Pain  N/V/ fever  Referring Physician(s): TRH  History of Present Illness: Jaime [name] is a 74 y.o. male   Pt with Multiple sclerosis PVD: B AKA Onset N/V and fever x several days Work up reveals leukocytosis (19.9) +fever (100.8 this am) CT/US reveal acute cholecystitis Surgical consult feels not surgical candidate secondary recent elevated troponins and co morbidities Cardiac work up complete--prob demand ischemia Request made for IR to place percutaneous cholecystostomy drain placement Dr Earleen Newport has reviewed imaging and chart---approves procedure I have seen and examined pt Now scheduled for same   Past Medical History  Diagnosis Date  . Hyperlipidemia   . Cardiomyopathy, ischemic     EF 45% per ECHO 2008  //   EF 25%, echo, August, 2013 //  Echo (8/15):  Mild LVH, EF 30-35%, ant-lat and lat AK, inf HK, Gr 1 DD, mild MR, mild LAE  . Pulmonary hypertension     moderate ECHO Jan 2008  . Systolic heart failure   . Multiple sclerosis [place] -- LAST VISIT 11-20-2010  NOTE W/ CHART  . Lung nodule     resolved 11-2006 CT Chest  . Tobacco abuse     quit   . Increased prostate specific antigen (PSA) velocity   . Urinary retention     dx ~ 2-12, like from [place], now with a catheter, saw urology  . Carotid artery disease     a.  Doppler, February, 2012, 0-39% bilateral,Mild smooth plaque;  b.  Carotid US (8/15):  Bilateral 1-39% ICA >>> F/u 2 years  . Chronic indwelling Foley catheter   . Scoliosis associated with other condition   . Hemiparesis   . Fatigue SEVERE  . Impotence   . MI, acute, non ST segment elevation 2007    S/P PCI WITH X1 STENT (TAXUS DRUG-ELUTING) LEFT CIRCUMFLEX  . Diabetes mellitus     INSULIN-DEPENDANT  . Hypertension   . H/O pleural effusion 2008    POST THORACENTESIS  . History of colon polyps PRECANCEROUS  . Insomnia   . CAD (coronary  artery disease) CARDIOLOGIST- DR KATZ-- VISIT 06-05-2011 IN [facility]    non-STEMI, 2007.Marland Kitchenoccluded circumflex.. Taxus stent placed...residual 80% LAD...50% RCA  . Urinary tract infection     hx of  . Atherosclerotic PVD with ulceration     left foot  . PAC (premature atrial contraction)     December, 2013    Past Surgical History  Procedure Laterality Date  . Hernia repair  1990    (R)  . Thoracentesis  2008    PLEURAL EFFUSION  . Cystoscopy  07/30/2011    Procedure: CYSTOSCOPY;  Surgeon: Hanley Ben, MD;  Location: [facility];  Service: Urology;  Laterality: N/A;  . Transurethral resection of prostate  07/30/2011    Procedure: TRANSURETHRAL RESECTION OF THE PROSTATE (TURP);  Surgeon: Hanley Ben, MD;  Location[facility];  Service: Urology;  Laterality: N/A;  . Femoral-tibial bypass graft  03/11/2012    Procedure: BYPASS GRAFT FEMORAL-TIBIAL ARTERY;  Surgeon: Conrad [place], MD;  Location: [place];  Service: Vascular;  Laterality: Left;  Left Femoral -Tibial trunk bypass, Endarterectomy of Tibial- Peroneal trunk with vein angioplasty.  . Intraoperative arteriogram  03/11/2012    Procedure: INTRA OPERATIVE ARTERIOGRAM;  Surgeon: Conrad Boothwyn, MD;  Location: [place];  Service: Vascular;  Laterality: Left;  . Femoral-popliteal  bypass graft  03/11/2012    Procedure: BYPASS GRAFT FEMORAL-POPLITEAL ARTERY;  Surgeon: Conrad [place], MD;  Location: [place];  Service: Vascular;  Laterality: Left;  embolectomy left lower leg  . Amputation  03/17/2012    Procedure: AMPUTATION ABOVE KNEE;  Surgeon: Conrad Havana, MD;  Location: [place];  Service: Vascular;  Laterality: Left;  . Coronary angioplasty with stent placement  05-01-2006    OCCLUDED CIRCUMFLEX -- TAXUS STENT PLACMENT  AND RESIDUAL 80% LAD,  50% RCA  . Amputation  06/03/2012    Procedure: AMPUTATION ABOVE KNEE;  Surgeon: Conrad [place], MD;  Location: [place];  Service: Vascular;  Laterality: Right;  . Lower extremity  angiogram Bilateral 12/17/2011    Procedure: LOWER EXTREMITY ANGIOGRAM;  Surgeon: Serafina Mitchell, MD;  Location: [facility] CATH LAB;  Service: Cardiovascular;  Laterality: Bilateral;  bil lower extrem angio  . Abdominal angiogram  12/17/2011    Procedure: ABDOMINAL ANGIOGRAM;  Surgeon: Serafina Mitchell, MD;  Location: [facility] CATH LAB;  Service: Cardiovascular;;  . Abdominal aortagram N/A 08/19/2013    Procedure: ABDOMINAL Maxcine Ham;  Surgeon: Conrad Ponemah, MD;  Location: [facility] CATH LAB;  Service: Cardiovascular;  Laterality: N/A;    Allergies: Bee venom and Influenza vaccines  Medications: Prior to Admission medications   Medication Sig Start Date End Date Taking? Authorizing Provider  acetaminophen-codeine (TYLENOL #4) 300-60 MG per tablet Take 1 tablet by mouth 3 (three) times daily. Patient taking differently: Take 1 tablet by mouth 2 (two) times daily as needed for moderate pain or severe pain.  05/31/14  Yes Charlett Blake, MD  aspirin EC 325 MG tablet Take 325 mg by mouth daily.   Yes Historical Provider, MD  atorvastatin (LIPITOR) 40 MG tablet TAKE 1 TABLET (40 MG TOTAL) BY MOUTH AT BEDTIME.   Yes Colon Branch, MD  baclofen (LIORESAL) 10 MG tablet TAKE 0.5 TABLETS (5 MG TOTAL) BY MOUTH 2 (TWO) TIMES DAILY 05/31/14  Yes Charlett Blake, MD  benazepril (LOTENSIN) 10 MG tablet TAKE 1 TABLET BY MOUTH EVERY DAY   Yes Colon Branch, MD  diclofenac sodium (VOLTAREN) 1 % GEL Apply 1 application topically 2 (two) times daily as needed (pain). For pain 03/09/12  Yes Charlett Blake, MD  insulin aspart protamine- aspart (NOVOLOG MIX 70/30) (70-30) 100 UNIT/ML injection Inject 15-45 Units into the skin 2 (two) times daily with a meal. Take 45 units in the morning with breakfast and take 15 units at supper   Yes Historical Provider, MD  metoprolol succinate (TOPROL-XL) 25 MG 24 hr tablet TAKE 1.5 TABLETS (37.5 MG TOTAL) BY MOUTH DAILY.   Yes Colon Branch, MD  pantoprazole (PROTONIX) 40 MG tablet TAKE 1 TABLET BY  MOUTH EVERY DAY AT 12 NOON 07/25/14  Yes Colon Branch, MD  gabapentin (NEURONTIN) 300 MG capsule Take 2 capsules (600 mg total) by mouth 3 (three) times daily. Patient not taking: Reported on 08/18/2014 02/11/14   Dudley Major, DO  glucagon (GLUCAGEN) 1 MG SOLR injection Inject 1 mg into the vein once as needed for low blood sugar.    Historical Provider, MD  mupirocin ointment (BACTROBAN) 2 % Apply 1 application topically 2 (two) times daily. Patient not taking: Reported on 08/18/2014 05/25/14   Colon Branch, MD    Family History  Problem Relation Age of Onset  . Hypertension    . Heart attack Mother 28  . Heart disease Mother   . Stroke  Mother   . Hyperlipidemia Mother   . Hypertension Mother   . Colon cancer Neg Hx   . Prostate cancer Neg Hx   . COPD Father   . Peripheral vascular disease Father   . Diabetes Brother   . Heart disease Brother   . Hypertension Brother   . Heart attack Brother   . Diabetes Daughter     History   Social History  . Marital Status: Married    Spouse Name: N/A    Number of Children: 2  . Years of Education: N/A   Occupational History  . disable    Social History Main Topics  . Smoking status: Former Smoker -- 64 years    Quit date: 07/22/2012  . Smokeless tobacco: Former Systems developer    Quit date: 08/05/2009     Comment: pt states that he is using E-cigs  . Alcohol Use: No  . Drug Use: No  . Sexual Activity: No     Comment: electronic cigarettes   Other Topics Concern  . None   Social History Narrative   Lives w/ wife    Review of Systems: A 12 point ROS discussed and pertinent positives are indicated in the HPI above.  All other systems are negative.  Review of Systems  Constitutional: Positive for fever and activity change.  Respiratory: Negative for shortness of breath.   Gastrointestinal: Positive for nausea, vomiting and abdominal pain.  Neurological: Positive for weakness.  Psychiatric/Behavioral: Negative for confusion.     Vital Signs: BP 105/69 mmHg  Pulse 95  Temp(Src) 100.1 F (37.8 C) (Oral)  Resp 27  Ht 5\' 11"  (1.803 m)  Wt 66.633 kg (146 lb 14.4 oz)  BMI 20.50 kg/m2  SpO2 95%  Physical Exam  Cardiovascular:  No murmur heard. Irreg rate/rhythm  Pulmonary/Chest: Effort normal and breath sounds normal. He has no wheezes.  Abdominal: Soft. Bowel sounds are normal. There is tenderness.  Musculoskeletal:  B AKA  Neurological: He is alert.  Very groggy Spoke mostly to wife  Skin: Skin is warm and dry.  Psychiatric: He has a normal mood and affect. His behavior is normal.  Consented with wife at bedside  Nursing note and vitals reviewed.   Imaging: Ct Abdomen Pelvis W Contrast  08/18/2014   CLINICAL DATA:  Periumbilical and right upper quadrant pain for 2 days. Elevated white blood cell count and elevated lactic acid.  EXAM: CT ABDOMEN AND PELVIS WITH CONTRAST  TECHNIQUE: Multidetector CT imaging of the abdomen and pelvis was performed using the standard protocol following bolus administration of intravenous contrast.  CONTRAST:  100 mL OMNIPAQUE IOHEXOL 300 MG/ML  SOLN  COMPARISON:  CT abdomen and pelvis 10/09/2010.  FINDINGS: Mild dependent atelectasis is present in the lung bases. Heart size is enlarged. There is calcific coronary artery disease.  The gallbladder is markedly dilated with wall thickening and surrounding inflammatory change. The liver is diffusely low attenuating with a 1.4 cm cyst on image 15 which is unchanged in appearance. The biliary tree, adrenal glands, spleen and pancreas appear normal. Bilateral renal cysts are noted.  The patient has aortoiliac atherosclerosis. The right common iliac artery is dilated at 1.8 cm. The left common iliac artery measures 1.5 cm. There is a large volume of stool in the cecum. The colon is otherwise unremarkable. The stomach and small bowel appear normal. No lymphadenopathy is identified. Small fat containing umbilical hernia is noted.  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 No other  focal bony lesion is identified.  IMPRESSION: Findings highly suspicious for acute cholecystitis. Right upper quadrant ultrasound could be used for further evaluation.  Fatty infiltration of the liver.  Calcific aortic and coronary atherosclerosis. 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  Small fat containing umbilical hernia.  Avascular necrosis of the femoral heads bilaterally.   Electronically Signed   By: Inge Rise M.D.   On: 08/18/2014 16:23   Dg Chest Portable 1 View  08/18/2014   CLINICAL DATA:  Initial encounter for abdominal pain.  Ex-smoker.  EXAM: PORTABLE CHEST - 1 VIEW  COMPARISON:  12/16/2013  FINDINGS: Numerous leads and wires project over the chest. Midline trachea. Moderate cardiomegaly. No pleural effusion or pneumothorax. mild subsegmental atelectasis at the left lung base. No gross free intraperitoneal air. Convex right thoracolumbar spine curvature.  IMPRESSION: Cardiomegaly, without acute disease.   Electronically Signed   By: Abigail Miyamoto M.D.   On: 08/18/2014 13:58   US Abdomen Limited Ruq  08/19/2014   CLINICAL DATA:  Right upper quadrant abdominal pain.  EXAM: US ABDOMEN LIMITED - RIGHT UPPER QUADRANT  COMPARISON:  None.  FINDINGS: Gallbladder:  Gallbladder is moderately distended. Multiple stones and sludge are demonstrated in the gallbladder. Largest stone measures about 8 mm diameter. Focal area of mild gallbladder wall thickening up to 3.3 mm. Murphy's sign is negative.  Common bile duct:  Diameter: 6.3 mm, upper limits normal. Mild intrahepatic bile duct dilatation is suggested. No filling defects are demonstrated although portions of the bowel duct are obscured by bowel gas.  Liver:  With diffusely increased coarsened liver echotexture likely representing fatty infiltration. No focal lesion definitively identified.  IMPRESSION:  Cholelithiasis and gallbladder sludge with mild gallbladder wall thickening and distention. Changes may indicate acute cholecystitis although Murphy's sign is negative. Mild intrahepatic bile duct dilatation of nonspecific etiology.   Electronically Signed   By: Lucienne Capers M.D.   On: 08/19/2014 06:08    Labs:  CBC:  Recent Labs  11/01/13 1930 08/18/14 1250 08/18/14 1340 08/19/14 0643  WBC 6.8 19.9*  --  18.0*  HGB 14.5 16.1 18.7* 14.6  HCT 44.1 47.4 55.0* 42.7  PLT 218 256  --  205    COAGS:  Recent Labs  08/19/14 0820  INR 1.45  APTT 39*    BMP:  Recent Labs  11/01/13 1930 02/21/14 1153 08/18/14 1250 08/18/14 1340 08/19/14 0643  NA 139 133* 135 135 138  K 3.9 4.0 4.3 4.3 3.7  CL 103 103 102 103 107  CO2 24 23 18*  --  22  GLUCOSE 83 171* 311* 318* 72  BUN 18 13 21  26* 17  CALCIUM 9.6 8.8 9.2  --  8.3*  CREATININE 0.65 0.7 1.25 1.10 0.76  GFRNONAA >90  --  56*  --  89*  GFRAA >90  --  65*  --  >90    LIVER FUNCTION TESTS:  Recent Labs  11/01/13 1930 08/18/14 1250 08/19/14 0643  BILITOT 0.4 1.4* 3.1*  AST 17 34 459*  ALT 24 44 324*  ALKPHOS 81 146* 219*  PROT 7.4 8.1 6.9  ALBUMIN 3.3* 3.4* 2.8*    TUMOR MARKERS: No results for input(s): AFPTM, CEA, CA199, CHROMGRNA in the last 8760 hours.  Assessment and Plan:  N/V abd pain/fever Work up reveals acute cholecystitis Not surgical candidate per Dr [name] Now scheduled for percutaneous cholecystostomy drain  Placement  pts wife aware of procedure benefits and risks including but not limited to: Infection; bleeding; organ damage; prolonged placement She is agreeable to proceed Consent signed andin chart  Thank you for this interesting consult.  I greatly enjoyed meeting Jaime [name] and look forward to participating in their care.  Signed: Sophie Tamez A 08/19/2014, 9:35 AM   I spent a total of 40 minutes face to face in clinical consultation, greater than 50% of which was  counseling/coordinating care for perc chole drain

## 2014-08-19 NOTE — Progress Notes (Signed)
Patient Demographics  Jaime [name], is a 74 y.o. male, DOB - 09-27-1940, MVE:[id number]  Admit date - 08/18/2014   Admitting Physician Jaime Molder, MD  Outpatient Primary MD for the patient is Jaime November, MD  LOS - 1   Chief Complaint  Patient presents with  . Abdominal Pain        Subjective:   Jaime [name] today has, No headache, No chest pain, +ve RUQ abdominal pain - No Nausea, No new weakness tingling or numbness, No Cough - SOB.    Assessment & Plan    Principal Problem:   Sepsis Active Problems:   Multiple sclerosis   Pulmonary hypertension   S/P AKA (above knee amputation) bilateral   Mild cognitive impairment   Chronic systolic CHF (congestive heart failure)   Acute cholecystitis   UTI (lower urinary tract infection)   IDDM (insulin dependent diabetes mellitus)   She is a seborrheic EKG didn't show much he says he's off a UA she has easy she's come in for chest pain rule out    1. Sepsis with lactic acidosis due to acute cholecystitis. Gen. surgery and GI called, due for percutaneous gallbladder drain placement by IR on 08/19/2014 as he is deemed to be a poor operative candidate by surgery. Empiric IV Zosyn continue. Follow cultures. Since liver enzymes are trending up requested GI to evaluate for possible MRCP/ERCP. Continue supportive care with IV fluids, continue to monitor in step down today.   2. Hypotension. Due to #1 above. Hold blood pressure medications. IV fluids and monitor.   3. DM type II. Sugars borderline. Hold long-acting insulin as he is nothing by mouth, every 4 sliding scale and monitor.  CBG (last 3)   Recent Labs  08/18/14 2214  GLUCAP 78     Lab Results  Component Value Date   HGBA1C 7.1* 06/03/2012    4. UTI. Zosyn should suffice.  Follow cultures.   5. Chronic systolic heart failure. EF 30-35%. Currently in hypotension due to sepsis, hold blood pressure medications and diuretics, gently hydrate, monitor intake output and monitor clinically.   6. Elevated troponin with nonspecific EKG changes. Cardiology was called thought this is secondary to sepsis and did not want to provide formal consultation. Troponin trend appears to be non-ACS pattern and seems to be due to demand ischemia from sepsis. Place on 81 mg aspirin, no beta blocker due to sepsis and hypotension. Check echogram to evaluate wall motion. No chest pain.     Code Status: Full  Family Communication: none present  Disposition Plan: To be decided   Procedures   TTE  CT scan abdomen and pelvis. Right upper quadrant ultrasound.  Gallbladder and placement by IR on 08/19/2014  Consults  CCS, GI , IR   Medications  Scheduled Meds: . baclofen  5 mg Oral BID  . heparin  5,000 Units Subcutaneous 3 times per day  . insulin aspart  0-9 Units Subcutaneous Q4H  . insulin glargine  20 Units Subcutaneous QHS  . pantoprazole (PROTONIX) IV  40 mg Intravenous Q24H  . piperacillin-tazobactam (ZOSYN)  IV  3.375 g Intravenous Q8H   Continuous Infusions: . sodium chloride     PRN Meds:.acetaminophen-codeine, alum & mag hydroxide-simeth, morphine injection, [DISCONTINUED] ondansetron **  OR** ondansetron (ZOFRAN) IV  DVT Prophylaxis    Heparin    Lab Results  Component Value Date   PLT 205 08/19/2014    Antibiotics     Anti-infectives    Start     Dose/Rate Route Frequency Ordered Stop   08/19/14 0300  vancomycin (VANCOCIN) IVPB 750 mg/150 ml premix  Status:  Discontinued     750 mg150 mL/hr over 60 Minutes Intravenous Every 12 hours 08/18/14 1359 08/19/14 1122   08/18/14 2000  piperacillin-tazobactam (ZOSYN) IVPB 3.375 g     3.375 g12.5 mL/hr over 240 Minutes Intravenous Every 8 hours 08/18/14 1359     08/18/14 1315  piperacillin-tazobactam (ZOSYN)  IVPB 3.375 g     3.375 g100 mL/hr over 30 Minutes Intravenous  Once 08/18/14 1300 08/18/14 1425   08/18/14 1315  vancomycin (VANCOCIN) IVPB 1000 mg/200 mL premix     1,000 mg200 mL/hr over 60 Minutes Intravenous  Once 08/18/14 1300 08/18/14 1527          Objective:   Filed Vitals:   08/19/14 0730 08/19/14 0907 08/19/14 1000 08/19/14 1050  BP:  105/69    Pulse:  95  96  Temp:  100.1 F (37.8 C)    TempSrc:  Oral    Resp:  27 22 18   Height:      Weight:      SpO2: 95% 95% 96% 99%    Wt Readings from Last 3 Encounters:  08/18/14 66.633 kg (146 lb 14.4 oz)  08/19/13 67.132 kg (148 lb)  08/13/13 67.132 kg (148 lb)     Intake/Output Summary (Last 24 hours) at 08/19/14 1123 Last data filed at 08/19/14 6010  Gross per 24 hour  Intake   3250 ml  Output    800 ml  Net   2450 ml     Physical Exam  Awake Alert, Oriented X 3, No new F.N deficits, Normal affect [place].AT,PERRAL Supple Neck,No JVD, No cervical lymphadenopathy appriciated.  Symmetrical Chest wall movement, Good air movement bilaterally, CTAB RRR,No Gallops,Rubs or new Murmurs, No Parasternal Heave +ve B.Sounds, Abd Soft, RUQ tenderness, No organomegaly appriciated, No rebound - guarding or rigidity. No Cyanosis, Clubbing or edema, No new Rash or bruise , bilateral BKA   Data Review   Micro Results Recent Results (from the past 240 hour(s))  Blood Culture (routine x 2)     Status: None (Preliminary result)   Collection Time: 08/18/14  1:22 PM  Result Value Ref Range Status   Specimen Description BLOOD LEFT ANTECUBITAL  Final   Special Requests BOTTLES DRAWN AEROBIC AND ANAEROBIC 10CC  Final   Culture   Final           BLOOD CULTURE RECEIVED NO GROWTH TO DATE CULTURE WILL BE HELD FOR 5 DAYS BEFORE ISSUING A FINAL NEGATIVE REPORT Performed at [facility]    Report Status PENDING  Incomplete  Blood Culture (routine x 2)     Status: None (Preliminary result)   Collection Time: 08/18/14  1:27 PM    Result Value Ref Range Status   Specimen Description BLOOD HAND LEFT  Final   Special Requests BOTTLES DRAWN AEROBIC ONLY 5CC  Final   Culture   Final           BLOOD CULTURE RECEIVED NO GROWTH TO DATE CULTURE WILL BE HELD FOR 5 DAYS BEFORE ISSUING A FINAL NEGATIVE REPORT Performed at [facility]    Report Status PENDING  Incomplete  MRSA PCR Screening  Status: None   Collection Time: 08/18/14  6:02 PM  Result Value Ref Range Status   MRSA by PCR NEGATIVE NEGATIVE Final    Comment:        The GeneXpert MRSA Assay (FDA approved for NASAL specimens only), is one component of a comprehensive MRSA colonization surveillance program. It is not intended to diagnose MRSA infection nor to guide or monitor treatment for MRSA infections.     Radiology Reports Ct Abdomen Pelvis W Contrast  08/18/2014   CLINICAL DATA:  Periumbilical and right upper quadrant pain for 2 days. Elevated white blood cell count and elevated lactic acid.  EXAM: CT ABDOMEN AND PELVIS WITH CONTRAST  TECHNIQUE: Multidetector CT imaging of the abdomen and pelvis was performed using the standard protocol following bolus administration of intravenous contrast.  CONTRAST:  100 mL OMNIPAQUE IOHEXOL 300 MG/ML  SOLN  COMPARISON:  CT abdomen and pelvis 10/09/2010.  FINDINGS: Mild dependent atelectasis is present in the lung bases. Heart size is enlarged. There is calcific coronary artery disease.  The gallbladder is markedly dilated with wall thickening and surrounding inflammatory change. The liver is diffusely low attenuating with a 1.4 cm cyst on image 15 which is unchanged in appearance. The biliary tree, adrenal glands, spleen and pancreas appear normal. Bilateral renal cysts are noted.  The patient has aortoiliac atherosclerosis. The right common iliac artery is dilated at 1.8 cm. The left common iliac artery measures 1.5 cm. There is a large volume of stool in the cecum. The colon is otherwise unremarkable. The  stomach and small bowel appear normal. No lymphadenopathy is identified. Small fat containing umbilical hernia is noted.  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 No other focal bony lesion is identified.  IMPRESSION: Findings highly suspicious for acute cholecystitis. Right upper quadrant ultrasound could be used for further evaluation.  Fatty infiltration of the liver.  Calcific aortic and coronary atherosclerosis. 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  Small fat containing umbilical hernia.  Avascular necrosis of the femoral heads bilaterally.   Electronically Signed   By: Inge Rise M.D.   On: 08/18/2014 16:23   Dg Chest Portable 1 View  08/18/2014   CLINICAL DATA:  Initial encounter for abdominal pain.  Ex-smoker.  EXAM: PORTABLE CHEST - 1 VIEW  COMPARISON:  12/16/2013  FINDINGS: Numerous leads and wires project over the chest. Midline trachea. Moderate cardiomegaly. No pleural effusion or pneumothorax. mild subsegmental atelectasis at the left lung base. No gross free intraperitoneal air. Convex right thoracolumbar spine curvature.  IMPRESSION: Cardiomegaly, without acute disease.   Electronically Signed   By: Abigail Miyamoto M.D.   On: 08/18/2014 13:58   US Abdomen Limited Ruq  08/19/2014   CLINICAL DATA:  Right upper quadrant abdominal pain.  EXAM: US ABDOMEN LIMITED - RIGHT UPPER QUADRANT  COMPARISON:  None.  FINDINGS: Gallbladder:  Gallbladder is moderately distended. Multiple stones and sludge are demonstrated in the gallbladder. Largest stone measures about 8 mm diameter. Focal area of mild gallbladder wall thickening up to 3.3 mm. Murphy's sign is negative.  Common bile duct:  Diameter: 6.3 mm, upper limits normal. Mild intrahepatic bile duct dilatation is suggested. No filling defects are demonstrated although portions of the bowel duct are obscured by bowel gas.  Liver:  With diffusely  increased coarsened liver echotexture likely representing fatty infiltration. No focal lesion definitively identified.  IMPRESSION: Cholelithiasis and gallbladder  sludge with mild gallbladder wall thickening and distention. Changes may indicate acute cholecystitis although Murphy's sign is negative. Mild intrahepatic bile duct dilatation of nonspecific etiology.   Electronically Signed   By: Lucienne Capers M.D.   On: 08/19/2014 06:08     CBC  Recent Labs Lab 08/18/14 1250 08/18/14 1340 08/19/14 0643  WBC 19.9*  --  18.0*  HGB 16.1 18.7* 14.6  HCT 47.4 55.0* 42.7  PLT 256  --  205  MCV 79.0  --  77.5*  MCH 26.8  --  26.5  MCHC 34.0  --  34.2  RDW 16.7*  --  16.5*  LYMPHSABS 1.2  --   --   MONOABS 1.4*  --   --   EOSABS 0.0  --   --   BASOSABS 0.0  --   --     Chemistries   Recent Labs Lab 08/18/14 1250 08/18/14 1340 08/19/14 0643  NA 135 135 138  K 4.3 4.3 3.7  CL 102 103 107  CO2 18*  --  22  GLUCOSE 311* 318* 72  BUN 21 26* 17  CREATININE 1.25 1.10 0.76  CALCIUM 9.2  --  8.3*  AST 34  --  459*  ALT 44  --  324*  ALKPHOS 146*  --  219*  BILITOT 1.4*  --  3.1*   ------------------------------------------------------------------------------------------------------------------ estimated creatinine clearance is 78.6 mL/min (by C-G formula based on Cr of 0.76). ------------------------------------------------------------------------------------------------------------------ No results for input(s): HGBA1C in the last 72 hours. ------------------------------------------------------------------------------------------------------------------ No results for input(s): CHOL, HDL, LDLCALC, TRIG, CHOLHDL, LDLDIRECT in the last 72 hours. ------------------------------------------------------------------------------------------------------------------ No results for input(s): TSH, T4TOTAL, T3FREE, THYROIDAB in the last 72 hours.  Invalid input(s):  FREET3 ------------------------------------------------------------------------------------------------------------------ No results for input(s): VITAMINB12, FOLATE, FERRITIN, TIBC, IRON, RETICCTPCT in the last 72 hours.  Coagulation profile  Recent Labs Lab 08/19/14 0820  INR 1.45    No results for input(s): DDIMER in the last 72 hours.  Cardiac Enzymes  Recent Labs Lab 08/18/14 1850 08/18/14 2327 08/19/14 0643  TROPONINI 0.08* 0.06* 0.06*   ------------------------------------------------------------------------------------------------------------------ Invalid input(s): POCBNP     Time Spent in minutes  35   SINGH,PRASHANT K M.D on 08/19/2014 at 11:23 AM  Between 7am to 7pm - Pager - [phone]  After 7pm go to [email or web address] - [facility] Office  [phone]

## 2014-08-19 NOTE — Sedation Documentation (Signed)
400 ml D5 1/2NS saline bolus given to Pt, along with 600 bolus NS for a total of 1059ml. Pt BP inititally was in the low 80's.

## 2014-08-20 LAB — URINE CULTURE: Colony Count: >=100000

## 2014-08-20 LAB — COMPREHENSIVE METABOLIC PANEL
ALK PHOS: 174 U/L — AB (ref 39–117)
ALT: 179 U/L — ABNORMAL HIGH (ref 0–53)
AST: 126 U/L — ABNORMAL HIGH (ref 0–37)
Albumin: 2.2 g/dL — ABNORMAL LOW (ref 3.5–5.2)
Anion gap: 6 (ref 5–15)
BUN: 18 mg/dL (ref 6–23)
CO2: 22 mmol/L (ref 19–32)
CREATININE: 0.97 mg/dL (ref 0.50–1.35)
Calcium: 7.7 mg/dL — ABNORMAL LOW (ref 8.4–10.5)
Chloride: 107 mmol/L (ref 96–112)
GFR, EST NON AFRICAN AMERICAN: 81 mL/min — AB (ref >90)
Glucose, Bld: 196 mg/dL — ABNORMAL HIGH (ref 70–99)
Potassium: 3.4 mmol/L — ABNORMAL LOW (ref 3.5–5.1)
Sodium: 135 mmol/L (ref 135–145)
Total Bilirubin: 3.7 mg/dL — ABNORMAL HIGH (ref 0.3–1.2)
Total Protein: 5.6 g/dL — ABNORMAL LOW (ref 6.0–8.3)

## 2014-08-20 LAB — CBC
HCT: 34 % — ABNORMAL LOW (ref 39.0–52.0)
Hemoglobin: 11.4 g/dL — ABNORMAL LOW (ref 13.0–17.0)
MCH: 25.8 pg — AB (ref 26.0–34.0)
MCHC: 33.5 g/dL (ref 30.0–36.0)
MCV: 76.9 fL — ABNORMAL LOW (ref 78.0–100.0)
Platelets: 188 10*3/uL (ref 150–400)
RBC: 4.42 MIL/uL (ref 4.22–5.81)
RDW: 16.6 % — AB (ref 11.5–15.5)
WBC: 10.2 10*3/uL (ref 4.0–10.5)

## 2014-08-20 LAB — CULTURE, BLOOD (ROUTINE X 2)

## 2014-08-20 LAB — HEMOGLOBIN A1C
Hgb A1c MFr Bld: 8.9 % — ABNORMAL HIGH (ref 4.8–5.6)
MEAN PLASMA GLUCOSE: 209 mg/dL

## 2014-08-20 LAB — GLUCOSE, CAPILLARY
GLUCOSE-CAPILLARY: 179 mg/dL — AB (ref 70–99)
GLUCOSE-CAPILLARY: 175 mg/dL — AB (ref 70–99)
Glucose-Capillary: 159 mg/dL — ABNORMAL HIGH (ref 70–99)
Glucose-Capillary: 160 mg/dL — ABNORMAL HIGH (ref 70–99)
Glucose-Capillary: 153 mg/dL — ABNORMAL HIGH (ref 70–99)

## 2014-08-20 MED ORDER — POTASSIUM CHLORIDE CRYS ER 20 MEQ PO TBCR
40.0000 meq | EXTENDED_RELEASE_TABLET | ORAL | Status: DC
  Administered 2014-08-20: 40 meq via ORAL
  Filled 2014-08-20: qty 2

## 2014-08-20 MED ORDER — POTASSIUM CHLORIDE IN NACL 40-0.9 MEQ/L-% IV SOLN
INTRAVENOUS | Status: DC
  Administered 2014-08-20 – 2014-08-21 (×2): 100 mL/h via INTRAVENOUS
  Filled 2014-08-20 (×3): qty 1000

## 2014-08-20 NOTE — Progress Notes (Signed)
Subjective: RUQ pain  Objective: Vital signs in last 24 hours: Temp:  [98.8 F (37.1 C)-100.9 F (38.3 C)] 99.3 F (37.4 C) (01/30 0736) Pulse Rate:  [95-109] 95 (01/30 0736) Resp:  [14-30] 22 (01/29 1830) BP: (82-141)/(51-94) 98/61 mmHg (01/30 0736) SpO2:  [25 %-99 %] 96 % (01/30 0736) Last BM Date: 08/16/14  Intake/Output from previous day: 01/29 0701 - 01/30 0700 In: 2591.7 [P.O.:240; I.V.:1891.7; IV Piggyback:450] Out: 1910 [Urine:1600; Drains:310] Intake/Output this shift:    General appearance: cooperative Resp: clear to auscultation bilaterally Cardio: regular rate and rhythm GI: some distention and remains tender RUQ with some guarding, mild TTP elsewhere, perc chole drain with bile  Lab Results:   Recent Labs  08/19/14 0643 08/20/14 0305  WBC 18.0* 10.2  HGB 14.6 11.4*  HCT 42.7 34.0*  PLT 205 188   BMET  Recent Labs  08/19/14 0643 08/20/14 0305  NA 138 135  K 3.7 3.4*  CL 107 107  CO2 22 22  GLUCOSE 72 196*  BUN 17 18  CREATININE 0.76 0.97  CALCIUM 8.3* 7.7*   PT/INR  Recent Labs  08/19/14 0820  LABPROT 17.7*  INR 1.45   ABG No results for input(s): PHART, HCO3 in the last 72 hours.  Invalid input(s): PCO2, PO2  Studies/Results: Ct Abdomen Pelvis W Contrast  08/18/2014   CLINICAL DATA:  Periumbilical and right upper quadrant pain for 2 days. Elevated white blood cell count and elevated lactic acid.  EXAM: CT ABDOMEN AND PELVIS WITH CONTRAST  TECHNIQUE: Multidetector CT imaging of the abdomen and pelvis was performed using the standard protocol following bolus administration of intravenous contrast.  CONTRAST:  100 mL OMNIPAQUE IOHEXOL 300 MG/ML  SOLN  COMPARISON:  CT abdomen and pelvis 10/09/2010.  FINDINGS: Mild dependent atelectasis is present in the lung bases. Heart size is enlarged. There is calcific coronary artery disease.  The gallbladder is markedly dilated with wall thickening and surrounding inflammatory change. The liver is  diffusely low attenuating with a 1.4 cm cyst on image 15 which is unchanged in appearance. The biliary tree, adrenal glands, spleen and pancreas appear normal. Bilateral renal cysts are noted.  The patient has aortoiliac atherosclerosis. The right common iliac artery is dilated at 1.8 cm. The left common iliac artery measures 1.5 cm. There is a large volume of stool in the cecum. The colon is otherwise unremarkable. The stomach and small bowel appear normal. No lymphadenopathy is identified. Small fat containing umbilical hernia is noted.  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 No other focal bony lesion is identified.  IMPRESSION: Findings highly suspicious for acute cholecystitis. Right upper quadrant ultrasound could be used for further evaluation.  Fatty infiltration of the liver.  Calcific aortic and coronary atherosclerosis. 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  Small fat containing umbilical hernia.  Avascular necrosis of the femoral heads bilaterally.   Electronically Signed   By: Inge Rise M.D.   On: 08/18/2014 16:23   Dg Chest Portable 1 View  08/18/2014   CLINICAL DATA:  Initial encounter for abdominal pain.  Ex-smoker.  EXAM: PORTABLE CHEST - 1 VIEW  COMPARISON:  12/16/2013  FINDINGS: Numerous leads and wires project over the chest. Midline trachea. Moderate cardiomegaly. No pleural effusion or pneumothorax. mild subsegmental atelectasis at the left lung base. No gross free intraperitoneal air. Convex right thoracolumbar spine curvature.  IMPRESSION: Cardiomegaly, without acute disease.  Electronically Signed   By: Abigail Miyamoto M.D.   On: 08/18/2014 13:58   Ir Percutaneous Transhepatic Cholangiogram  08/19/2014   INDICATION: 74 year old male with a history of acute cholecystitis. He has been referred for evaluation for cholecystostomy placement.  EXAM: ULTRASOUND AND  FLUOROSCOPIC-GUIDED CHOLECYSTOSTOMY TUBE PLACEMENT  COMPARISON:  None.  MEDICATIONS: Fentanyl 125 mcg IV; Versed 0 mg IV; The patient is currently admitted to the hospital and on intravenous antibiotics. 500 mg of Levaquin were administered periprocedure.  25 mg Demerol IV  ANESTHESIA/SEDATION: Total Moderate Sedation Time  Zero minutes  CONTRAST:  64mL OMNIPAQUE IOHEXOL 300 MG/ML  SOLN  FLUOROSCOPY TIME:  minutes  COMPLICATIONS: None immediate.  PROCEDURE: Informed written consent was obtained from the patient after a discussion of the risks, benefits and alternatives to treatment. Questions regarding the procedure were encouraged and answered. A timeout was performed prior to the initiation of the procedure.  The right upper abdominal quadrant was prepped and draped in the usual sterile fashion, and a sterile drape was applied covering the operative field. Maximum barrier sterile technique with sterile gowns and gloves were used for the procedure. A timeout was performed prior to the initiation of the procedure. Local anesthesia was provided with 1% lidocaine with epinephrine.  Ultrasound scanning of the right upper quadrant demonstrates a markedly dilated gallbladder. Utilizing a transhepatic approach, a 22 gauge needle was advanced into the gallbladder under direct ultrasound guidance. An ultrasound image was saved for documentation purposes. Appropriate intraluminal puncture was confirmed with the efflux of bile and advancement of an 0.018 wire into the gallbladder lumen. The needle was exchanged for an [place] set. A small amount of contrast was injected to confirm appropriate intraluminal positioning. Over a [place] wire, a 45.2-French Cook cholecystomy tube was advanced into the gallbladder fossa, coiled and locked. Bile was aspirated and a small amount of contrast was injected as several post procedural spot radiographic images were obtained in various obliquities. The catheter was secured to the skin with  suture, connected to a drainage bag and a dressing was placed.  The patient tolerated the procedure well without immediate post procedural complication.  IMPRESSION: Status post percutaneous cholecystostomy tube placement. A sample of fluid was sent to the lab for analysis.  Signed,  Dulcy Fanny. Earleen Newport, DO  [facility]  [facility]   Electronically Signed   By: Corrie Mckusick D.O.   On: 08/19/2014 16:18   US Abdomen Limited Ruq  08/19/2014   CLINICAL DATA:  Right upper quadrant abdominal pain.  EXAM: US ABDOMEN LIMITED - RIGHT UPPER QUADRANT  COMPARISON:  None.  FINDINGS: Gallbladder:  Gallbladder is moderately distended. Multiple stones and sludge are demonstrated in the gallbladder. Largest stone measures about 8 mm diameter. Focal area of mild gallbladder wall thickening up to 3.3 mm. Murphy's sign is negative.  Common bile duct:  Diameter: 6.3 mm, upper limits normal. Mild intrahepatic bile duct dilatation is suggested. No filling defects are demonstrated although portions of the bowel duct are obscured by bowel gas.  Liver:  With diffusely increased coarsened liver echotexture likely representing fatty infiltration. No focal lesion definitively identified.  IMPRESSION: Cholelithiasis and gallbladder sludge with mild gallbladder wall thickening and distention. Changes may indicate acute cholecystitis although Murphy's sign is negative. Mild intrahepatic bile duct dilatation of nonspecific etiology.   Electronically Signed   By: Lucienne Capers M.D.   On: 08/19/2014 06:08    Anti-infectives: Anti-infectives    Start  Dose/Rate Route Frequency Ordered Stop   08/19/14 1515  levofloxacin (LEVAQUIN) IVPB 500 mg     500 mg100 mL/hr over 60 Minutes Intravenous Every 24 hours 08/19/14 1514     08/19/14 1500  vancomycin (VANCOCIN) IVPB 750 mg/150 ml premix     750 mg150 mL/hr over 60 Minutes Intravenous Every 12 hours 08/19/14 1450     08/19/14 0300  vancomycin  (VANCOCIN) IVPB 750 mg/150 ml premix  Status:  Discontinued     750 mg150 mL/hr over 60 Minutes Intravenous Every 12 hours 08/18/14 1359 08/19/14 1122   08/18/14 2000  piperacillin-tazobactam (ZOSYN) IVPB 3.375 g     3.375 g12.5 mL/hr over 240 Minutes Intravenous Every 8 hours 08/18/14 1359     08/18/14 1315  piperacillin-tazobactam (ZOSYN) IVPB 3.375 g     3.375 g100 mL/hr over 30 Minutes Intravenous  Once 08/18/14 1300 08/18/14 1425   08/18/14 1315  vancomycin (VANCOCIN) IVPB 1000 mg/200 mL premix     1,000 mg200 mL/hr over 60 Minutes Intravenous  Once 08/18/14 1300 08/18/14 1527      Assessment/Plan: Cholecystitis - S/P perc drain, still quite symptomatic, continue ABX, clears only Transaminitis and hyperbilirubinemia - noted plan by GI for cholangiogram via existing tube in a few days  LOS: 2 days    Jaime [name] 08/20/2014

## 2014-08-20 NOTE — Progress Notes (Signed)
Referring Physician(s): TRH  Subjective:  Perc cholecystostomy drain placed 1/29 Feels better Eating some  Allergies: Bee venom and Influenza vaccines  Medications: Prior to Admission medications   Medication Sig Start Date End Date Taking? Authorizing Provider  acetaminophen-codeine (TYLENOL #4) 300-60 MG per tablet Take 1 tablet by mouth 3 (three) times daily. Patient taking differently: Take 1 tablet by mouth 2 (two) times daily as needed for moderate pain or severe pain.  05/31/14  Yes Charlett Blake, MD  aspirin EC 325 MG tablet Take 325 mg by mouth daily.   Yes Historical Provider, MD  atorvastatin (LIPITOR) 40 MG tablet TAKE 1 TABLET (40 MG TOTAL) BY MOUTH AT BEDTIME.   Yes Colon Branch, MD  baclofen (LIORESAL) 10 MG tablet TAKE 0.5 TABLETS (5 MG TOTAL) BY MOUTH 2 (TWO) TIMES DAILY 05/31/14  Yes Charlett Blake, MD  benazepril (LOTENSIN) 10 MG tablet TAKE 1 TABLET BY MOUTH EVERY DAY   Yes Colon Branch, MD  diclofenac sodium (VOLTAREN) 1 % GEL Apply 1 application topically 2 (two) times daily as needed (pain). For pain 03/09/12  Yes Charlett Blake, MD  insulin aspart protamine- aspart (NOVOLOG MIX 70/30) (70-30) 100 UNIT/ML injection Inject 15-45 Units into the skin 2 (two) times daily with a meal. Take 45 units in the morning with breakfast and take 15 units at supper   Yes Historical Provider, MD  metoprolol succinate (TOPROL-XL) 25 MG 24 hr tablet TAKE 1.5 TABLETS (37.5 MG TOTAL) BY MOUTH DAILY.   Yes Colon Branch, MD  pantoprazole (PROTONIX) 40 MG tablet TAKE 1 TABLET BY MOUTH EVERY DAY AT 12 NOON 07/25/14  Yes Colon Branch, MD  gabapentin (NEURONTIN) 300 MG capsule Take 2 capsules (600 mg total) by mouth 3 (three) times daily. Patient not taking: Reported on 08/18/2014 02/11/14   Dudley Major, DO  glucagon (GLUCAGEN) 1 MG SOLR injection Inject 1 mg into the vein once as needed for low blood sugar.    Historical Provider, MD  mupirocin ointment (BACTROBAN) 2 % Apply 1  application topically 2 (two) times daily. Patient not taking: Reported on 08/18/2014 05/25/14   Colon Branch, MD    Review of Systems  Vital Signs: BP 98/61 mmHg  Pulse 95  Temp(Src) 99.3 F (37.4 C) (Oral)  Resp 22  Ht 5\' 11"  (1.803 m)  Wt 66.633 kg (146 lb 14.4 oz)  BMI 20.50 kg/m2  SpO2 96%  Physical Exam  Abdominal:  Site of cholecystostomy drain clean and dry NT No bleeding Output 310 cc yesterday 50 cc in bag: bile Wbc 10.1 (18)     Imaging: Ct Abdomen Pelvis W Contrast  08/18/2014   CLINICAL DATA:  Periumbilical and right upper quadrant pain for 2 days. Elevated white blood cell count and elevated lactic acid.  EXAM: CT ABDOMEN AND PELVIS WITH CONTRAST  TECHNIQUE: Multidetector CT imaging of the abdomen and pelvis was performed using the standard protocol following bolus administration of intravenous contrast.  CONTRAST:  100 mL OMNIPAQUE IOHEXOL 300 MG/ML  SOLN  COMPARISON:  CT abdomen and pelvis 10/09/2010.  FINDINGS: Mild dependent atelectasis is present in the lung bases. Heart size is enlarged. There is calcific coronary artery disease.  The gallbladder is markedly dilated with wall thickening and surrounding inflammatory change. The liver is diffusely low attenuating with a 1.4 cm cyst on image 15 which is unchanged in appearance. The biliary tree, adrenal glands, spleen and pancreas appear normal. Bilateral renal cysts  are noted.  The patient has aortoiliac atherosclerosis. The right common iliac artery is dilated at 1.8 cm. The left common iliac artery measures 1.5 cm. There is a large volume of stool in the cecum. The colon is otherwise unremarkable. The stomach and small bowel appear normal. No lymphadenopathy is identified. Small fat containing umbilical hernia is noted.  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 No other focal bony lesion is identified.  IMPRESSION: Findings highly suspicious for acute cholecystitis. Right upper quadrant  ultrasound could be used for further evaluation.  Fatty infiltration of the liver.  Calcific aortic and coronary atherosclerosis. 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  Small fat containing umbilical hernia.  Avascular necrosis of the femoral heads bilaterally.   Electronically Signed   By: Inge Rise M.D.   On: 08/18/2014 16:23   Dg Chest Portable 1 View  08/18/2014   CLINICAL DATA:  Initial encounter for abdominal pain.  Ex-smoker.  EXAM: PORTABLE CHEST - 1 VIEW  COMPARISON:  12/16/2013  FINDINGS: Numerous leads and wires project over the chest. Midline trachea. Moderate cardiomegaly. No pleural effusion or pneumothorax. mild subsegmental atelectasis at the left lung base. No gross free intraperitoneal air. Convex right thoracolumbar spine curvature.  IMPRESSION: Cardiomegaly, without acute disease.   Electronically Signed   By: Abigail Miyamoto M.D.   On: 08/18/2014 13:58   Ir Percutaneous Transhepatic Cholangiogram  08/19/2014   INDICATION: 74 year old male with a history of acute cholecystitis. He has been referred for evaluation for cholecystostomy placement.  EXAM: ULTRASOUND AND FLUOROSCOPIC-GUIDED CHOLECYSTOSTOMY TUBE PLACEMENT  COMPARISON:  None.  MEDICATIONS: Fentanyl 125 mcg IV; Versed 0 mg IV; The patient is currently admitted to the hospital and on intravenous antibiotics. 500 mg of Levaquin were administered periprocedure.  25 mg Demerol IV  ANESTHESIA/SEDATION: Total Moderate Sedation Time  Zero minutes  CONTRAST:  49mL OMNIPAQUE IOHEXOL 300 MG/ML  SOLN  FLUOROSCOPY TIME:  minutes  COMPLICATIONS: None immediate.  PROCEDURE: Informed written consent was obtained from the patient after a discussion of the risks, benefits and alternatives to treatment. Questions regarding the procedure were encouraged and answered. A timeout was performed prior to the initiation of the procedure.  The right upper abdominal quadrant was  prepped and draped in the usual sterile fashion, and a sterile drape was applied covering the operative field. Maximum barrier sterile technique with sterile gowns and gloves were used for the procedure. A timeout was performed prior to the initiation of the procedure. Local anesthesia was provided with 1% lidocaine with epinephrine.  Ultrasound scanning of the right upper quadrant demonstrates a markedly dilated gallbladder. Utilizing a transhepatic approach, a 22 gauge needle was advanced into the gallbladder under direct ultrasound guidance. An ultrasound image was saved for documentation purposes. Appropriate intraluminal puncture was confirmed with the efflux of bile and advancement of an 0.018 wire into the gallbladder lumen. The needle was exchanged for an [place] set. A small amount of contrast was injected to confirm appropriate intraluminal positioning. Over a Benson wire, a 80.2-French Cook cholecystomy tube was advanced into the gallbladder fossa, coiled and locked. Bile was aspirated and a small amount of contrast was injected as several post procedural spot radiographic images were obtained in various obliquities. The catheter was secured to the skin with suture, connected to a drainage bag and a dressing was placed.  The patient tolerated the procedure well without immediate post procedural  complication.  IMPRESSION: Status post percutaneous cholecystostomy tube placement. A sample of fluid was sent to the lab for analysis.  Signed,  Dulcy Fanny. Earleen Newport, DO  [facility]  [facility]   Electronically Signed   By: Corrie Mckusick D.O.   On: 08/19/2014 16:18   US Abdomen Limited Ruq  08/19/2014   CLINICAL DATA:  Right upper quadrant abdominal pain.  EXAM: US ABDOMEN LIMITED - RIGHT UPPER QUADRANT  COMPARISON:  None.  FINDINGS: Gallbladder:  Gallbladder is moderately distended. Multiple stones and sludge are demonstrated in the gallbladder. Largest stone  measures about 8 mm diameter. Focal area of mild gallbladder wall thickening up to 3.3 mm. Murphy's sign is negative.  Common bile duct:  Diameter: 6.3 mm, upper limits normal. Mild intrahepatic bile duct dilatation is suggested. No filling defects are demonstrated although portions of the bowel duct are obscured by bowel gas.  Liver:  With diffusely increased coarsened liver echotexture likely representing fatty infiltration. No focal lesion definitively identified.  IMPRESSION: Cholelithiasis and gallbladder sludge with mild gallbladder wall thickening and distention. Changes may indicate acute cholecystitis although Murphy's sign is negative. Mild intrahepatic bile duct dilatation of nonspecific etiology.   Electronically Signed   By: Lucienne Capers M.D.   On: 08/19/2014 06:08    Labs:  CBC:  Recent Labs  11/01/13 1930 08/18/14 1250 08/18/14 1340 08/19/14 0643 08/20/14 0305  WBC 6.8 19.9*  --  18.0* 10.2  HGB 14.5 16.1 18.7* 14.6 11.4*  HCT 44.1 47.4 55.0* 42.7 34.0*  PLT 218 256  --  205 188    COAGS:  Recent Labs  08/19/14 0820  INR 1.45  APTT 39*    BMP:  Recent Labs  11/01/13 1930 02/21/14 1153 08/18/14 1250 08/18/14 1340 08/19/14 0643 08/20/14 0305  NA 139 133* 135 135 138 135  K 3.9 4.0 4.3 4.3 3.7 3.4*  CL 103 103 102 103 107 107  CO2 24 23 18*  --  22 22  GLUCOSE 83 171* 311* 318* 72 196*  BUN 18 13 21  26* 17 18  CALCIUM 9.6 8.8 9.2  --  8.3* 7.7*  CREATININE 0.65 0.7 1.25 1.10 0.76 0.97  GFRNONAA >90  --  56*  --  89* 81*  GFRAA >90  --  65*  --  >90 >90    LIVER FUNCTION TESTS:  Recent Labs  11/01/13 1930 08/18/14 1250 08/19/14 0643 08/20/14 0305  BILITOT 0.4 1.4* 3.1* 3.7*  AST 17 34 459* 126*  ALT 24 44 324* 179*  ALKPHOS 81 146* 219* 174*  PROT 7.4 8.1 6.9 5.6*  ALBUMIN 3.3* 3.4* 2.8* 2.2*    Assessment and Plan:  Percutaneous cholecystostomy drain placed 1/29 Better today Plan for drain 6-8 weeks at least Plan per MD    I  spent a total of 15 minutes face to face in clinical consultation/evaluation, greater than 50% of which was counseling/coordinating care for perc chole drain  Signed: Ronnetta Currington A 08/20/2014, 9:28 AM

## 2014-08-20 NOTE — Progress Notes (Addendum)
Patient Demographics  Jaime [name], is a 74 y.o. male, DOB - 09/27/1940, TOI:[id number]  Admit date - 08/18/2014   Admitting Physician No admitting provider for patient encounter.  Outpatient Primary MD for the patient is Jaime November, MD  LOS - 2   Chief Complaint  Patient presents with  . Abdominal Pain        Subjective:   Jaime [name] today has, No headache, No chest pain, +ve RUQ abdominal pain with some referred right shoulder pain - No Nausea, No new weakness tingling or numbness, No Cough - SOB.    Assessment & Plan    1. Sepsis with lactic acidosis due to acute cholecystitis. Gen. surgery and GI following, post percutaneous gallbladder drain placement by IR on 08/19/2014 as he is deemed to be a poor operative candidate by surgery. Empiric IV Zosyn continue & follow cultures. Liver enzymes trending down. Continue supportive care with IV fluids, continue to monitor in step down today.    2. Hypotension. Due to #1 above. Hold blood pressure medications. IV fluids and monitor.    3. DM type II. Sugars borderline. Hold long-acting insulin as he is nothing by mouth, every 4 sliding scale and monitor.  CBG (last 3)   Recent Labs  08/20/14 0105 08/20/14 0450 08/20/14 0801  GLUCAP 159* 160* 153*     Lab Results  Component Value Date   HGBA1C 8.9* 08/19/2014     4. UTI. Zosyn should suffice. Follow cultures.    5. Chronic systolic heart failure. EF now 40% improved from 35%  Currently in hypotension due to sepsis, hold blood pressure medications and diuretics, gently hydrate, monitor intake output and monitor clinically.    6. Elevated troponin with nonspecific EKG changes. Cardiology was called thought this is secondary to sepsis and did not want to provide formal  consultation. Troponin trend appears to be non-ACS pattern and seems to be due to demand ischemia from sepsis. Placed on 81 mg aspirin, no beta blocker due to sepsis and hypotension. Echogram noted with improved EF of 40% continues to have diffuse hypokinesis similar to recent echo in 2015. No chest pain. Patient cardiology follow-up once septic issues resolved.    7. 1 out of 2 sets of blood culture positive for gram-positive cocci in clusters. Question contamination. Cover with Vanco till cultures finalized.     Code Status: Full,  prognosis guarded.  Family Communication: none present  Disposition Plan: To be decided    Procedures   TTE Left ventricle: The cavity size was normal. Systolic function was moderately reduced. The estimated ejection fraction was in the range of 35% to 40%. Diffuse hypokinesis. The study was not technically sufficient to allow evaluation of LV diastolic dysfunction due to atrial fibrillation. - Aortic valve: Trileaflet; mildly thickened, mildly calcified leaflets. There was no regurgitation. - Aortic root: The aortic root was normal in size. - Ascending aorta: The ascending aorta was normal in size. - Mitral valve: Structurally normal valve. There was no regurgitation. - Left atrium: The atrium was normal in size. - Right atrium: The atrium was normal in size. - Tricuspid valve: There was no regurgitation. - Pulmonic valve: There was no regurgitation. - Pulmonary arteries: Systolic pressure was within the normal range. -  Inferior vena cava: The vessel was normal in size. - Pericardium, extracardiac: There was no pericardial effusion.   CT scan abdomen and pelvis. Right upper quadrant ultrasound.  Perc. Gallbladder Drain placement by IR on 08/19/2014  Consults  CCS, GI , IR   Medications  Scheduled Meds: . aspirin  81 mg Oral Daily  . baclofen  5 mg Oral BID  . heparin  5,000 Units Subcutaneous 3 times per day  . insulin  aspart  0-9 Units Subcutaneous Q4H  . levofloxacin (LEVAQUIN) IV  500 mg Intravenous Q24H  . pantoprazole (PROTONIX) IV  40 mg Intravenous Q24H  . piperacillin-tazobactam (ZOSYN)  IV  3.375 g Intravenous Q8H  . potassium chloride  40 mEq Oral Q4H  . vancomycin  750 mg Intravenous Q12H   Continuous Infusions: . dextrose 5 % and 0.45% NaCl 1,000 mL (08/20/14 0944)   PRN Meds:.acetaminophen-codeine, alum & mag hydroxide-simeth, morphine injection, nitroGLYCERIN, [DISCONTINUED] ondansetron **OR** ondansetron (ZOFRAN) IV, sodium chloride  DVT Prophylaxis    Heparin    Lab Results  Component Value Date   PLT 188 08/20/2014    Antibiotics     Anti-infectives    Start     Dose/Rate Route Frequency Ordered Stop   08/19/14 1515  levofloxacin (LEVAQUIN) IVPB 500 mg     500 mg100 mL/hr over 60 Minutes Intravenous Every 24 hours 08/19/14 1514     08/19/14 1500  vancomycin (VANCOCIN) IVPB 750 mg/150 ml premix     750 mg150 mL/hr over 60 Minutes Intravenous Every 12 hours 08/19/14 1450     08/19/14 0300  vancomycin (VANCOCIN) IVPB 750 mg/150 ml premix  Status:  Discontinued     750 mg150 mL/hr over 60 Minutes Intravenous Every 12 hours 08/18/14 1359 08/19/14 1122   08/18/14 2000  piperacillin-tazobactam (ZOSYN) IVPB 3.375 g     3.375 g12.5 mL/hr over 240 Minutes Intravenous Every 8 hours 08/18/14 1359     08/18/14 1315  piperacillin-tazobactam (ZOSYN) IVPB 3.375 g     3.375 g100 mL/hr over 30 Minutes Intravenous  Once 08/18/14 1300 08/18/14 1425   08/18/14 1315  vancomycin (VANCOCIN) IVPB 1000 mg/200 mL premix     1,000 mg200 mL/hr over 60 Minutes Intravenous  Once 08/18/14 1300 08/18/14 1527          Objective:   Filed Vitals:   08/20/14 0000 08/20/14 0400 08/20/14 0453 08/20/14 0736  BP: 93/66 87/58 99/63  98/61  Pulse: 102 100 99 95  Temp: 99.5 F (37.5 C) 99.2 F (37.3 C)  99.3 F (37.4 C)  TempSrc: Oral Oral  Oral  Resp:      Height:      Weight:      SpO2: 98% 93% 93% 96%     Wt Readings from Last 3 Encounters:  08/18/14 66.633 kg (146 lb 14.4 oz)  08/19/13 67.132 kg (148 lb)  08/13/13 67.132 kg (148 lb)     Intake/Output Summary (Last 24 hours) at 08/20/14 1023 Last data filed at 08/20/14 7001  Gross per 24 hour  Intake 2471.67 ml  Output   1910 ml  Net 561.67 ml     Physical Exam  Awake Alert, Oriented X 3, No new F.N deficits, Normal affect [place].AT,PERRAL Supple Neck,No JVD, No cervical lymphadenopathy appriciated.  Symmetrical Chest wall movement, Good air movement bilaterally, CTAB RRR,No Gallops,Rubs or new Murmurs, No Parasternal Heave +ve B.Sounds, Abd Soft, RUQ tenderness has GB drain in place, No organomegaly appriciated, No rebound - guarding or rigidity. 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 , bilateral BKA   Data Review   Micro Results Recent Results (from the past 240 hour(s))  Blood Culture (routine x 2)     Status: None (Preliminary result)   Collection Time: 08/18/14  1:22 PM  Result Value Ref Range Status   Specimen Description BLOOD LEFT ANTECUBITAL  Final   Special Requests BOTTLES DRAWN AEROBIC AND ANAEROBIC 10CC  Final   Culture   Final           BLOOD CULTURE RECEIVED NO GROWTH TO DATE CULTURE WILL BE HELD FOR 5 DAYS BEFORE ISSUING A FINAL NEGATIVE REPORT Performed at [facility]    Report Status PENDING  Incomplete  Blood Culture (routine x 2)     Status: None (Preliminary result)   Collection Time: 08/18/14  1:27 PM  Result Value Ref Range Status   Specimen Description BLOOD HAND LEFT  Final   Special Requests BOTTLES DRAWN AEROBIC ONLY 5CC  Final   Culture   Final    GRAM POSITIVE COCCI IN CLUSTERS Note: Gram Stain Report Called to,Read Back By and Verified With: ALEX SOLOMON 08/19/14 1435 BY SMITHERSJ Performed at [facility]    Report Status PENDING  Incomplete  Urine culture     Status: None (Preliminary result)   Collection Time: 08/18/14  1:53 PM  Result Value Ref  Range Status   Specimen Description URINE, CATHETERIZED  Final   Special Requests NONE  Final   Colony Count   Final    >=100,000 COLONIES/ML Performed at [facility]    Culture   Final    PROTEUS MIRABILIS Performed at [facility]    Report Status PENDING  Incomplete  MRSA PCR Screening     Status: None   Collection Time: 08/18/14  6:02 PM  Result Value Ref Range Status   MRSA by PCR NEGATIVE NEGATIVE Final    Comment:        The GeneXpert MRSA Assay (FDA approved for NASAL specimens only), is one component of a comprehensive MRSA colonization surveillance program. It is not intended to diagnose MRSA infection nor to guide or monitor treatment for MRSA infections.     Radiology Reports Ct Abdomen Pelvis W Contrast  08/18/2014   CLINICAL DATA:  Periumbilical and right upper quadrant pain for 2 days. Elevated white blood cell count and elevated lactic acid.  EXAM: CT ABDOMEN AND PELVIS WITH CONTRAST  TECHNIQUE: Multidetector CT imaging of the abdomen and pelvis was performed using the standard protocol following bolus administration of intravenous contrast.  CONTRAST:  100 mL OMNIPAQUE IOHEXOL 300 MG/ML  SOLN  COMPARISON:  CT abdomen and pelvis 10/09/2010.  FINDINGS: Mild dependent atelectasis is present in the lung bases. Heart size is enlarged. There is calcific coronary artery disease.  The gallbladder is markedly dilated with wall thickening and surrounding inflammatory change. The liver is diffusely low attenuating with a 1.4 cm cyst on image 15 which is unchanged in appearance. The biliary tree, adrenal glands, spleen and pancreas appear normal. Bilateral renal cysts are noted.  The patient has aortoiliac atherosclerosis. The right common iliac artery is dilated at 1.8 cm. The left common iliac artery measures 1.5 cm. There is a large volume of stool in the cecum. The colon is otherwise unremarkable. The stomach and small bowel appear normal. No lymphadenopathy  is identified. Small fat containing umbilical hernia is noted.  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 No other focal bony lesion is identified.  IMPRESSION: Findings highly suspicious for acute cholecystitis. Right upper quadrant ultrasound could be used for further evaluation.  Fatty infiltration of the liver.  Calcific aortic and coronary atherosclerosis. 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  Small fat containing umbilical hernia.  Avascular necrosis of the femoral heads bilaterally.   Electronically Signed   By: Inge Rise M.D.   On: 08/18/2014 16:23   Dg Chest Portable 1 View  08/18/2014   CLINICAL DATA:  Initial encounter for abdominal pain.  Ex-smoker.  EXAM: PORTABLE CHEST - 1 VIEW  COMPARISON:  12/16/2013  FINDINGS: Numerous leads and wires project over the chest. Midline trachea. Moderate cardiomegaly. No pleural effusion or pneumothorax. mild subsegmental atelectasis at the left lung base. No gross free intraperitoneal air. Convex right thoracolumbar spine curvature.  IMPRESSION: Cardiomegaly, without acute disease.   Electronically Signed   By: Abigail Miyamoto M.D.   On: 08/18/2014 13:58   Ir Percutaneous Transhepatic Cholangiogram  08/19/2014   INDICATION: 74 year old male with a history of acute cholecystitis. He has been referred for evaluation for cholecystostomy placement.  EXAM: ULTRASOUND AND FLUOROSCOPIC-GUIDED CHOLECYSTOSTOMY TUBE PLACEMENT  COMPARISON:  None.  MEDICATIONS: Fentanyl 125 mcg IV; Versed 0 mg IV; The patient is currently admitted to the hospital and on intravenous antibiotics. 500 mg of Levaquin were administered periprocedure.  25 mg Demerol IV  ANESTHESIA/SEDATION: Total Moderate Sedation Time  Zero minutes  CONTRAST:  69mL OMNIPAQUE IOHEXOL 300 MG/ML  SOLN  FLUOROSCOPY TIME:  minutes  COMPLICATIONS: None immediate.  PROCEDURE: Informed written consent was obtained  from the patient after a discussion of the risks, benefits and alternatives to treatment. Questions regarding the procedure were encouraged and answered. A timeout was performed prior to the initiation of the procedure.  The right upper abdominal quadrant was prepped and draped in the usual sterile fashion, and a sterile drape was applied covering the operative field. Maximum barrier sterile technique with sterile gowns and gloves were used for the procedure. A timeout was performed prior to the initiation of the procedure. Local anesthesia was provided with 1% lidocaine with epinephrine.  Ultrasound scanning of the right upper quadrant demonstrates a markedly dilated gallbladder. Utilizing a transhepatic approach, a 22 gauge needle was advanced into the gallbladder under direct ultrasound guidance. An ultrasound image was saved for documentation purposes. Appropriate intraluminal puncture was confirmed with the efflux of bile and advancement of an 0.018 wire into the gallbladder lumen. The needle was exchanged for an [place] set. A small amount of contrast was injected to confirm appropriate intraluminal positioning. Over a [place] wire, a 53.2-French Cook cholecystomy tube was advanced into the gallbladder fossa, coiled and locked. Bile was aspirated and a small amount of contrast was injected as several post procedural spot radiographic images were obtained in various obliquities. The catheter was secured to the skin with suture, connected to a drainage bag and a dressing was placed.  The patient tolerated the procedure well without immediate post procedural complication.  IMPRESSION: Status post percutaneous cholecystostomy tube placement. A sample of fluid was sent to the lab for analysis.  Signed,  Dulcy Fanny. Earleen Newport, DO  [facility]  [facility]   Electronically Signed   By: Corrie Mckusick D.O.   On: 08/19/2014 16:18   US Abdomen Limited Ruq  08/19/2014    CLINICAL DATA:  Right upper quadrant  abdominal pain.  EXAM: US ABDOMEN LIMITED - RIGHT UPPER QUADRANT  COMPARISON:  None.  FINDINGS: Gallbladder:  Gallbladder is moderately distended. Multiple stones and sludge are demonstrated in the gallbladder. Largest stone measures about 8 mm diameter. Focal area of mild gallbladder wall thickening up to 3.3 mm. Murphy's sign is negative.  Common bile duct:  Diameter: 6.3 mm, upper limits normal. Mild intrahepatic bile duct dilatation is suggested. No filling defects are demonstrated although portions of the bowel duct are obscured by bowel gas.  Liver:  With diffusely increased coarsened liver echotexture likely representing fatty infiltration. No focal lesion definitively identified.  IMPRESSION: Cholelithiasis and gallbladder sludge with mild gallbladder wall thickening and distention. Changes may indicate acute cholecystitis although Murphy's sign is negative. Mild intrahepatic bile duct dilatation of nonspecific etiology.   Electronically Signed   By: Lucienne Capers M.D.   On: 08/19/2014 06:08     CBC  Recent Labs Lab 08/18/14 1250 08/18/14 1340 08/19/14 0643 08/20/14 0305  WBC 19.9*  --  18.0* 10.2  HGB 16.1 18.7* 14.6 11.4*  HCT 47.4 55.0* 42.7 34.0*  PLT 256  --  205 188  MCV 79.0  --  77.5* 76.9*  MCH 26.8  --  26.5 25.8*  MCHC 34.0  --  34.2 33.5  RDW 16.7*  --  16.5* 16.6*  LYMPHSABS 1.2  --   --   --   MONOABS 1.4*  --   --   --   EOSABS 0.0  --   --   --   BASOSABS 0.0  --   --   --     Chemistries   Recent Labs Lab 08/18/14 1250 08/18/14 1340 08/19/14 0643 08/20/14 0305  NA 135 135 138 135  K 4.3 4.3 3.7 3.4*  CL 102 103 107 107  CO2 18*  --  22 22  GLUCOSE 311* 318* 72 196*  BUN 21 26* 17 18  CREATININE 1.25 1.10 0.76 0.97  CALCIUM 9.2  --  8.3* 7.7*  AST 34  --  459* 126*  ALT 44  --  324* 179*  ALKPHOS 146*  --  219* 174*  BILITOT 1.4*  --  3.1* 3.7*    ------------------------------------------------------------------------------------------------------------------ estimated creatinine clearance is 64.8 mL/min (by C-G formula based on Cr of 0.97). ------------------------------------------------------------------------------------------------------------------  Recent Labs  08/19/14 0643  HGBA1C 8.9*   ------------------------------------------------------------------------------------------------------------------ No results for input(s): CHOL, HDL, LDLCALC, TRIG, CHOLHDL, LDLDIRECT in the last 72 hours. ------------------------------------------------------------------------------------------------------------------ No results for input(s): TSH, T4TOTAL, T3FREE, THYROIDAB in the last 72 hours.  Invalid input(s): FREET3 ------------------------------------------------------------------------------------------------------------------ No results for input(s): VITAMINB12, FOLATE, FERRITIN, TIBC, IRON, RETICCTPCT in the last 72 hours.  Coagulation profile  Recent Labs Lab 08/19/14 0820  INR 1.45    No results for input(s): DDIMER in the last 72 hours.  Cardiac Enzymes  Recent Labs Lab 08/18/14 1850 08/18/14 2327 08/19/14 0643  TROPONINI 0.08* 0.06* 0.06*   ------------------------------------------------------------------------------------------------------------------ Invalid input(s): POCBNP     Time Spent in minutes  35   Breeley Bischof K M.D on 08/20/2014 at 10:23 AM  Between 7am to 7pm - Pager - [phone]  After 7pm go to [email or web address] - [facility] Office  [phone]

## 2014-08-20 NOTE — Progress Notes (Signed)
[facility] Progress Note    Since last GI note: Perc GB drain placed yesterday in IR  Still with abd pain, he says most in RLQ  Objective: Vital signs in last 24 hours: Temp:  [98.8 F (37.1 C)-100.9 F (38.3 C)] 99.3 F (37.4 C) (01/30 0736) Pulse Rate:  [95-109] 95 (01/30 0736) Resp:  [14-30] 22 (01/29 1830) BP: (82-141)/(51-94) 98/61 mmHg (01/30 0736) SpO2:  [25 %-99 %] 96 % (01/30 0736) Last BM Date: 08/16/14 General: alert and oriented times 3 Heart: regular rate and rythm Abdomen: soft, moderately tender RLQ, non-distended, normal bowel sounds   Lab Results:  Recent Labs  08/18/14 1250 08/18/14 1340 08/19/14 0643 08/20/14 0305  WBC 19.9*  --  18.0* 10.2  HGB 16.1 18.7* 14.6 11.4*  PLT 256  --  205 188  MCV 79.0  --  77.5* 76.9*    Recent Labs  08/18/14 1250 08/18/14 1340 08/19/14 0643 08/20/14 0305  NA 135 135 138 135  K 4.3 4.3 3.7 3.4*  CL 102 103 107 107  CO2 18*  --  22 22  GLUCOSE 311* 318* 72 196*  BUN 21 26* 17 18  CREATININE 1.25 1.10 0.76 0.97  CALCIUM 9.2  --  8.3* 7.7*    Recent Labs  08/18/14 1250 08/19/14 0643 08/20/14 0305  PROT 8.1 6.9 5.6*  ALBUMIN 3.4* 2.8* 2.2*  AST 34 459* 126*  ALT 44 324* 179*  ALKPHOS 146* 219* 174*  BILITOT 1.4* 3.1* 3.7*    Recent Labs  08/19/14 0820  INR 1.45     Studies/Results: Ct Abdomen Pelvis W Contrast  08/18/2014   CLINICAL DATA:  Periumbilical and right upper quadrant pain for 2 days. Elevated white blood cell count and elevated lactic acid.  EXAM: CT ABDOMEN AND PELVIS WITH CONTRAST  TECHNIQUE: Multidetector CT imaging of the abdomen and pelvis was performed using the standard protocol following bolus administration of intravenous contrast.  CONTRAST:  100 mL OMNIPAQUE IOHEXOL 300 MG/ML  SOLN  COMPARISON:  CT abdomen and pelvis 10/09/2010.  FINDINGS: Mild dependent atelectasis is present in the lung bases. Heart size is enlarged. There is calcific coronary artery  disease.  The gallbladder is markedly dilated with wall thickening and surrounding inflammatory change. The liver is diffusely low attenuating with a 1.4 cm cyst on image 15 which is unchanged in appearance. The biliary tree, adrenal glands, spleen and pancreas appear normal. Bilateral renal cysts are noted.  The patient has aortoiliac atherosclerosis. The right common iliac artery is dilated at 1.8 cm. The left common iliac artery measures 1.5 cm. There is a large volume of stool in the cecum. The colon is otherwise unremarkable. The stomach and small bowel appear normal. No lymphadenopathy is identified. Small fat containing umbilical hernia is noted.  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 No other focal bony lesion is identified.  IMPRESSION: Findings highly suspicious for acute cholecystitis. Right upper quadrant ultrasound could be used for further evaluation.  Fatty infiltration of the liver.  Calcific aortic and coronary atherosclerosis. 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  Small fat containing umbilical hernia.  Avascular necrosis of the femoral heads bilaterally.   Electronically Signed   By: Inge Rise M.D.   On: 08/18/2014 16:23   Dg Chest Portable 1 View  08/18/2014   CLINICAL DATA:  Initial encounter for abdominal pain.  Ex-smoker.  EXAM: PORTABLE  CHEST - 1 VIEW  COMPARISON:  12/16/2013  FINDINGS: Numerous leads and wires project over the chest. Midline trachea. Moderate cardiomegaly. No pleural effusion or pneumothorax. mild subsegmental atelectasis at the left lung base. No gross free intraperitoneal air. Convex right thoracolumbar spine curvature.  IMPRESSION: Cardiomegaly, without acute disease.   Electronically Signed   By: Abigail Miyamoto M.D.   On: 08/18/2014 13:58   Ir Percutaneous Transhepatic Cholangiogram  08/19/2014   INDICATION: 74 year old male with a history of acute  cholecystitis. He has been referred for evaluation for cholecystostomy placement.  EXAM: ULTRASOUND AND FLUOROSCOPIC-GUIDED CHOLECYSTOSTOMY TUBE PLACEMENT  COMPARISON:  None.  MEDICATIONS: Fentanyl 125 mcg IV; Versed 0 mg IV; The patient is currently admitted to the hospital and on intravenous antibiotics. 500 mg of Levaquin were administered periprocedure.  25 mg Demerol IV  ANESTHESIA/SEDATION: Total Moderate Sedation Time  Zero minutes  CONTRAST:  55mL OMNIPAQUE IOHEXOL 300 MG/ML  SOLN  FLUOROSCOPY TIME:  minutes  COMPLICATIONS: None immediate.  PROCEDURE: Informed written consent was obtained from the patient after a discussion of the risks, benefits and alternatives to treatment. Questions regarding the procedure were encouraged and answered. A timeout was performed prior to the initiation of the procedure.  The right upper abdominal quadrant was prepped and draped in the usual sterile fashion, and a sterile drape was applied covering the operative field. Maximum barrier sterile technique with sterile gowns and gloves were used for the procedure. A timeout was performed prior to the initiation of the procedure. Local anesthesia was provided with 1% lidocaine with epinephrine.  Ultrasound scanning of the right upper quadrant demonstrates a markedly dilated gallbladder. Utilizing a transhepatic approach, a 22 gauge needle was advanced into the gallbladder under direct ultrasound guidance. An ultrasound image was saved for documentation purposes. Appropriate intraluminal puncture was confirmed with the efflux of bile and advancement of an 0.018 wire into the gallbladder lumen. The needle was exchanged for an [place] set. A small amount of contrast was injected to confirm appropriate intraluminal positioning. Over a Benson wire, a 3.2-French Cook cholecystomy tube was advanced into the gallbladder fossa, coiled and locked. Bile was aspirated and a small amount of contrast was injected as several post procedural  spot radiographic images were obtained in various obliquities. The catheter was secured to the skin with suture, connected to a drainage bag and a dressing was placed.  The patient tolerated the procedure well without immediate post procedural complication.  IMPRESSION: Status post percutaneous cholecystostomy tube placement. A sample of fluid was sent to the lab for analysis.  Signed,  Dulcy Fanny. Earleen Newport, DO  [facility]  [facility]   Electronically Signed   By: Corrie Mckusick D.O.   On: 08/19/2014 16:18   US Abdomen Limited Ruq  08/19/2014   CLINICAL DATA:  Right upper quadrant abdominal pain.  EXAM: US ABDOMEN LIMITED - RIGHT UPPER QUADRANT  COMPARISON:  None.  FINDINGS: Gallbladder:  Gallbladder is moderately distended. Multiple stones and sludge are demonstrated in the gallbladder. Largest stone measures about 8 mm diameter. Focal area of mild gallbladder wall thickening up to 3.3 mm. Murphy's sign is negative.  Common bile duct:  Diameter: 6.3 mm, upper limits normal. Mild intrahepatic bile duct dilatation is suggested. No filling defects are demonstrated although portions of the bowel duct are obscured by bowel gas.  Liver:  With diffusely increased coarsened liver echotexture likely representing fatty infiltration. No focal lesion definitively identified.  IMPRESSION: Cholelithiasis and gallbladder sludge with  mild gallbladder wall thickening and distention. Changes may indicate acute cholecystitis although Murphy's sign is negative. Mild intrahepatic bile duct dilatation of nonspecific etiology.   Electronically Signed   By: Lucienne Capers M.D.   On: 08/19/2014 06:08     Medications: Scheduled Meds: . aspirin  81 mg Oral Daily  . baclofen  5 mg Oral BID  . heparin  5,000 Units Subcutaneous 3 times per day  . insulin aspart  0-9 Units Subcutaneous Q4H  . levofloxacin (LEVAQUIN) IV  500 mg Intravenous Q24H  . pantoprazole (PROTONIX) IV  40 mg  Intravenous Q24H  . piperacillin-tazobactam (ZOSYN)  IV  3.375 g Intravenous Q8H  . potassium chloride  40 mEq Oral Q4H  . vancomycin  750 mg Intravenous Q12H   Continuous Infusions: . dextrose 5 % and 0.45% NaCl 1,000 mL (08/20/14 0944)   PRN Meds:.acetaminophen-codeine, alum & mag hydroxide-simeth, morphine injection, nitroGLYCERIN, [DISCONTINUED] ondansetron **OR** ondansetron (ZOFRAN) IV, sodium chloride    Assessment/Plan: 74 y.o. male with acute cholecystitis (s/p perc GB drain yesterday), elevated LFTs with slightly dilated biliary tree  Transaminases improving, alk phos improving.  T bili up.  Certainly may have CBD pathology (stone?).  WBC much improved after Abx and perc GB drain.  Should continue broad spect abx. Will plan on cholangiogram via perc GB drain Monday or Tuesday to assess if he needs ERCP.  HAs persistent pains, especially RLQ.  Reviewing admitting CT he did have some fluid tracking down into RLQ, I suspect related to his acute cholecystitis.  The pain is probably due to residual edema, inflammatory fluid and should improve as he recovers.  Milus Banister, MD  08/20/2014, 10:13 AM [facility] Pager [phone]

## 2014-08-21 ENCOUNTER — Inpatient Hospital Stay (HOSPITAL_COMMUNITY): Payer: Medicare Other

## 2014-08-21 DIAGNOSIS — R1011 Right upper quadrant pain: Secondary | ICD-10-CM

## 2014-08-21 DIAGNOSIS — G35 Multiple sclerosis: Secondary | ICD-10-CM

## 2014-08-21 DIAGNOSIS — I5022 Chronic systolic (congestive) heart failure: Secondary | ICD-10-CM

## 2014-08-21 DIAGNOSIS — R0602 Shortness of breath: Secondary | ICD-10-CM

## 2014-08-21 DIAGNOSIS — I270 Primary pulmonary hypertension: Secondary | ICD-10-CM

## 2014-08-21 DIAGNOSIS — E119 Type 2 diabetes mellitus without complications: Secondary | ICD-10-CM

## 2014-08-21 DIAGNOSIS — I255 Ischemic cardiomyopathy: Secondary | ICD-10-CM

## 2014-08-21 DIAGNOSIS — Z794 Long term (current) use of insulin: Secondary | ICD-10-CM

## 2014-08-21 LAB — COMPREHENSIVE METABOLIC PANEL
ALT: 96 U/L — ABNORMAL HIGH (ref 0–53)
AST: 41 U/L — AB (ref 0–37)
Albumin: 2 g/dL — ABNORMAL LOW (ref 3.5–5.2)
Alkaline Phosphatase: 125 U/L — ABNORMAL HIGH (ref 39–117)
Anion gap: 4 — ABNORMAL LOW (ref 5–15)
BUN: 22 mg/dL (ref 6–23)
CHLORIDE: 110 mmol/L (ref 96–112)
CO2: 21 mmol/L (ref 19–32)
CREATININE: 1.06 mg/dL (ref 0.50–1.35)
Calcium: 7.8 mg/dL — ABNORMAL LOW (ref 8.4–10.5)
GFR, EST AFRICAN AMERICAN: 79 mL/min — AB (ref >90)
GFR, EST NON AFRICAN AMERICAN: 68 mL/min — AB (ref >90)
GLUCOSE: 132 mg/dL — AB (ref 70–99)
POTASSIUM: 4.7 mmol/L (ref 3.5–5.1)
Sodium: 135 mmol/L (ref 135–145)
Total Bilirubin: 1.3 mg/dL — ABNORMAL HIGH (ref 0.3–1.2)
Total Protein: 5.4 g/dL — ABNORMAL LOW (ref 6.0–8.3)

## 2014-08-21 LAB — CBC
HCT: 38.3 % — ABNORMAL LOW (ref 39.0–52.0)
HEMOGLOBIN: 12.7 g/dL — AB (ref 13.0–17.0)
MCH: 26.3 pg (ref 26.0–34.0)
MCHC: 33.2 g/dL (ref 30.0–36.0)
MCV: 79.3 fL (ref 78.0–100.0)
PLATELETS: 211 10*3/uL (ref 150–400)
RBC: 4.83 MIL/uL (ref 4.22–5.81)
RDW: 17.1 % — AB (ref 11.5–15.5)
WBC: 8.6 10*3/uL (ref 4.0–10.5)

## 2014-08-21 LAB — BLOOD GAS, ARTERIAL
ACID-BASE DEFICIT: 7.2 mmol/L — AB (ref 0.0–2.0)
Bicarbonate: 16.9 mEq/L — ABNORMAL LOW (ref 20.0–24.0)
Drawn by: 225631
FIO2: 0.21 %
O2 SAT: 95.4 %
PO2 ART: 73.7 mmHg — AB (ref 80.0–100.0)
Patient temperature: 98.6
TCO2: 17.8 mmol/L (ref 0–100)
pCO2 arterial: 29 mmHg — ABNORMAL LOW (ref 35.0–45.0)
pH, Arterial: 7.384 (ref 7.350–7.450)

## 2014-08-21 LAB — GLUCOSE, CAPILLARY
GLUCOSE-CAPILLARY: 69 mg/dL — AB (ref 70–99)
GLUCOSE-CAPILLARY: 199 mg/dL — AB (ref 70–99)
GLUCOSE-CAPILLARY: 166 mg/dL — AB (ref 70–99)
Glucose-Capillary: 63 mg/dL — ABNORMAL LOW (ref 70–99)
Glucose-Capillary: 97 mg/dL (ref 70–99)
Glucose-Capillary: 142 mg/dL — ABNORMAL HIGH (ref 70–99)

## 2014-08-21 LAB — TROPONIN I
TROPONIN I: 0.03 ng/mL (ref <0.031)
Troponin I: 0.03 ng/mL (ref <0.031)

## 2014-08-21 LAB — CLOSTRIDIUM DIFFICILE BY PCR: Toxigenic C. Difficile by PCR: NEGATIVE

## 2014-08-21 MED ORDER — ALBUTEROL SULFATE (2.5 MG/3ML) 0.083% IN NEBU
2.5000 mg | INHALATION_SOLUTION | RESPIRATORY_TRACT | Status: DC | PRN
  Administered 2014-08-21: 2.5 mg via RESPIRATORY_TRACT
  Filled 2014-08-21: qty 3

## 2014-08-21 MED ORDER — ACETAMINOPHEN 325 MG PO TABS
650.0000 mg | ORAL_TABLET | Freq: Four times a day (QID) | ORAL | Status: DC | PRN
  Administered 2014-08-21 – 2014-08-22 (×2): 650 mg via ORAL
  Filled 2014-08-21 (×2): qty 2

## 2014-08-21 MED ORDER — INSULIN ASPART 100 UNIT/ML ~~LOC~~ SOLN
0.0000 [IU] | Freq: Three times a day (TID) | SUBCUTANEOUS | Status: DC
  Administered 2014-08-21: 2 [IU] via SUBCUTANEOUS
  Administered 2014-08-22: 1 [IU] via SUBCUTANEOUS
  Administered 2014-08-23: 2 [IU] via SUBCUTANEOUS

## 2014-08-21 MED ORDER — GLUCOSE-VITAMIN C 4-6 GM-MG PO CHEW
CHEWABLE_TABLET | ORAL | Status: AC
  Filled 2014-08-21: qty 3

## 2014-08-21 MED ORDER — FUROSEMIDE 10 MG/ML IJ SOLN
20.0000 mg | Freq: Once | INTRAMUSCULAR | Status: AC
  Administered 2014-08-21: 20 mg via INTRAVENOUS
  Filled 2014-08-21: qty 2

## 2014-08-21 MED ORDER — GLUCOSE 4 G PO CHEW
3.0000 | CHEWABLE_TABLET | Freq: Once | ORAL | Status: AC
  Administered 2014-08-21: 12 g via ORAL
  Filled 2014-08-21: qty 3

## 2014-08-21 MED ORDER — INSULIN ASPART 100 UNIT/ML ~~LOC~~ SOLN
0.0000 [IU] | Freq: Three times a day (TID) | SUBCUTANEOUS | Status: DC
Start: 2014-08-21 — End: 2014-08-21

## 2014-08-21 MED ORDER — PRO-STAT SUGAR FREE PO LIQD
30.0000 mL | Freq: Three times a day (TID) | ORAL | Status: DC
  Administered 2014-08-21 – 2014-08-26 (×13): 30 mL via ORAL
  Filled 2014-08-21 (×18): qty 30

## 2014-08-21 MED ORDER — SODIUM CHLORIDE 0.9 % IV BOLUS (SEPSIS)
500.0000 mL | Freq: Once | INTRAVENOUS | Status: AC
  Administered 2014-08-21: 500 mL via INTRAVENOUS

## 2014-08-21 MED ORDER — SODIUM CHLORIDE 0.9 % IV SOLN
INTRAVENOUS | Status: AC
  Administered 2014-08-21: 1000 mL via INTRAVENOUS

## 2014-08-21 NOTE — Progress Notes (Signed)
Hypoglycemic Event  CBG: 63  Treatment: 15 GM carbohydrate snack  Symptoms: None  Follow-up CBG: Time:0815   CBG Result:69  Possible Reasons for Event: Unknown  Comments/MD notified:singh    Twain Stenseth S  Remember to initiate Hypoglycemia Order Set & completeAdult (Non-Pregnant) Hypoglycemia Protocol Treatment Guidelines  1.  RN shall initiate Hypoglycemia Protocol emergency measures immediately when:            w Routine or STAT CBG and/or a lab glucose indicates hypoglycemia (CBG < 70 mg/dl)  2.  Treat the patient according to ability to take PO's and severity of hypoglycemia.   3.  If patient is on GlucoStabilizer, follow directions provided by the [facility] for hypoglycemic events.  4.  If patient on insulin pump, follow Hypoglycemia Protocol.  If patient requires more than one treatment have patient place pump in SUSPEND and notify MD.  DO NOT leave pump in SUSPEND for greater than 30 minutes unless ordered by MD.  A.  Treatment for Mild or Moderate-Patient cooperative and able to swallow    1.  Patient taking PO's and can cooperate   a.  Give one of the following 15 gram CHO options:                           w 1 tube oral dextrose gel                           w 3-4 Glucose tablets                           w 4 oz. Juice                           w 4 oz. regular soda                                    ESRD patients:  clear, regular soda                           w 8 oz. skim milk    b.  Recheck CBG in 15 minutes after treatment                            w If CBG < 70 mg/dl, repeat treatment and recheck until hypoglycemia is resolved                            w If CBG > 70 mg/dl and next meal is more than 1 hour away, give additional 15 grams CHO   2.  Patient NPO-Patient cooperative and no altered mental status    a.  Give 25 ml of D50 IV.   b.  Recheck CBG in 15 minutes after treatment.                             w If CBG is less than 70 mg/dl,  repeat treatment and recheck until hypoglycemia is resolved.   c.  Notify MD for further orders.             SPECIAL  CONSIDERATIONS:    a.  If no IV access,                              w Start IV of D5W at [facility]                             w Give 25 ml of D50 IV.    b.  If unable to gain IV access                             w Give Glucagon IM:    i.  1 mg if patient weighs more than 45.5 kg    ii.  0.5 mg if patient weighs less than 45.5 kg   c.  Notify MD for further orders  B.  Treatment for Severe-- Patient unconscious or unable to take PO's safely    1.  Position patient on side   2.  Give 50 ml D50 IV   3.  Recheck CBG in 15 minutes.                    w If CBG is less than 70 mg/dl, repeat treatment and recheck until hypoglycemia is resolved.   4.  Notify MD for further orders.    SPECIAL CONSIDERATIONS:    a.  If no IV access                              w Give Glucagon IM                                        i.  1 mg if patient weighs more than 45.5 kg                                       ii.  0.5 mg if patient weighs less than 45.5 kg                              w Start IV of D5W at 50 ml/hr and give 50 ml D50 IV   b.  If no IV access and active seizure                               w Call Rapid Response   c.  If unable to gain IV access, give Glucagon IM:                              w 1 mg if patient weighs more than 45.5 kg                              w 0.5 mg if patient weighs less than 45.5 kg   d.  Notify MD for further orders.  C.  Complete smart text progress note to document intervention and follow-up CBG   1.  In [facility]  patient chart, click on Notes (left side of screen)   2.  Create Progress Note   3.  Click on [employer].  In the Match box type "hypo" and enter    4.  Double click on [facility] IP HYPOGLYCEMIC EVENT and enter data   5.  MD must be notified if patient is NPO or experienced severe hypoglycemia

## 2014-08-21 NOTE — Progress Notes (Addendum)
  1831:  Pt informed MD of output post lasix administration during 1700 hour, work of breathing remains unchanged. Pt denies complaint.   MD acknowledged nurse concerns no additional nursing interventions are ordered at this time, consults are ordered.  Nurse will continue to monitor.  1729: MD contacted by nurse to inform pt continues to experience dyspnea at rest, RR 20-30's, nurse also informed MD that pt heart rhythm has changed recently, nurse obtained STAT 12 lead.  MD will contact CCM

## 2014-08-21 NOTE — Progress Notes (Addendum)
1638:  Pt continues to display increase work of breathing however pt does not appear in distress, RR ranging from high teens - high 20's, pt denies complaint.  MD reviewed chest xray, orders will be written.  Nurse will continue to monitor.  1442:  Nurse was called into pt room once wife arrived on unit.  Wife stated that pt appeared to be working harder to breathe than yesterday. Pt denied complaint.  Pt assessment remains unchanged from am. Pt oxygen saturation low 90's on RA, Pt was placed on 2 L  for ease of breathing.  Nurse contacted MD to inform, MD will write orders.  Nurse will continue to monitor.

## 2014-08-21 NOTE — Progress Notes (Addendum)
Patient Demographics  Jaime [name], is a 74 y.o. male, DOB - July 08, 1941, NWG:[id number]  Admit date - 08/18/2014   Admitting Physician No admitting provider for patient encounter.  Outpatient Primary MD for the patient is [name], MD  LOS - 3   Chief Complaint  Patient presents with  . Abdominal Pain        Subjective:   Jaime [name] today has, No headache, No chest pain, Improved RUQ abdominal pain - No Nausea, No new weakness tingling or numbness, No Cough - SOB.    Assessment & Plan    1. Sepsis with lactic acidosis due to acute cholecystitis. Gen. surgery and GI following, post percutaneous gallbladder drain placement by IR on 08/19/2014 as he is deemed to be a poor operative candidate by surgery. Empiric IV Zosyn continue & follow cultures. Liver enzymes trending down. Continue supportive care with IV fluids, continue to monitor in step down today.    2. Hypotension. Due to #1 above. Hold blood pressure medications. IV fluids bolus + maintenance  and monitor.    3. DM type II. Sugars borderline. Hold long-acting insulin every 8 hrs low dose sliding scale and monitor.  CBG (last 3)   Recent Labs  08/20/14 0801 08/20/14 1239 08/20/14 1549  GLUCAP 153* 179* 175*     Lab Results  Component Value Date   HGBA1C 8.9* 08/19/2014     4. UTI. Zosyn should suffice. Follow cultures.    5. Chronic systolic heart failure. EF now 40% improved from 35%  Currently in hypotension due to sepsis, hold blood pressure medications and diuretics, gently hydrate, monitor intake output and monitor clinically.    6. Elevated troponin with nonspecific EKG changes. Cardiology was called thought this is secondary to sepsis and did not want to provide formal consultation. Troponin trend  appears to be non-ACS pattern and seems to be due to demand ischemia from sepsis. Placed on 81 mg aspirin, no beta blocker due to sepsis and hypotension. Echogram noted with improved EF of 40% continues to have diffuse hypokinesis similar to recent echo in 2015. No chest pain. Patient cardiology follow-up once septic issues resolved.    7. 1 out of 2 sets of blood culture positive for gram-positive cocci in clusters. Now Caog -ve. contamination. Stop Vancomycin.    8. 3rd spacing of fluids -  increase PO intake added Prostat , monitor. Lasix gently.    9. H/O MS, Bilat AKA - supportive Rx.    Addendum - due to 3rd spacing now Acute and Chr Systolic CHF, BP still soft, Fluids held, gentle lasix as BP low, CXR-ABG stable for now, EKG, Trop, PCCM and Cards called.      Code Status: Full,  prognosis guarded.  Family Communication: none present  Disposition Plan: To be decided    Procedures   TTE  Left ventricle: The cavity size was normal. Systolic function was moderately reduced. The estimated ejection fraction was in the range of 35% to 40%. Diffuse hypokinesis. The study was not technically sufficient to allow evaluation of LV diastolicdysfunction due to atrial fibrillation. - Aortic valve: Trileaflet; mildly thickened, mildly calcifiedleaflets. There was no regurgitation. - Aortic root: The aortic root was normal in size. - Ascending aorta: The  ascending aorta was normal in size. - Mitral valve: Structurally normal valve. There was noregurgitation. - Left atrium: The atrium was normal in size. - Right atrium: The atrium was normal in size. - Tricuspid valve: There was no regurgitation. - Pulmonic valve: There was no regurgitation. - Pulmonary arteries: Systolic pressure was within the normalrange. - Inferior vena cava: The vessel was normal in size. - Pericardium, extracardiac: There was no pericardial effusion.   CT scan abdomen and pelvis. Right upper quadrant  ultrasound.   Perc. Kennyth Arnold.bladder Drain placement by IR on 08/19/2014   Consults  CCS, GI , IR   Medications  Scheduled Meds: . aspirin  81 mg Oral Daily  . baclofen  5 mg Oral BID  . feeding supplement (PRO-STAT SUGAR FREE 64)  30 mL Oral TID WC  . heparin  5,000 Units Subcutaneous 3 times per day  . insulin aspart  0-9 Units Subcutaneous 3 times per day  . pantoprazole (PROTONIX) IV  40 mg Intravenous Q24H  . piperacillin-tazobactam (ZOSYN)  IV  3.375 g Intravenous Q8H  . sodium chloride  500 mL Intravenous Once  . vancomycin  750 mg Intravenous Q12H   Continuous Infusions: . sodium chloride     PRN Meds:.acetaminophen-codeine, alum & mag hydroxide-simeth, morphine injection, nitroGLYCERIN, [DISCONTINUED] ondansetron **OR** ondansetron (ZOFRAN) IV, sodium chloride  DVT Prophylaxis    Heparin    Lab Results  Component Value Date   PLT 211 08/21/2014    Antibiotics     Anti-infectives    Start     Dose/Rate Route Frequency Ordered Stop   08/19/14 1515  levofloxacin (LEVAQUIN) IVPB 500 mg  Status:  Discontinued     500 mg100 mL/hr over 60 Minutes Intravenous Every 24 hours 08/19/14 1514 08/20/14 1616   08/19/14 1500  vancomycin (VANCOCIN) IVPB 750 mg/150 ml premix     750 mg150 mL/hr over 60 Minutes Intravenous Every 12 hours 08/19/14 1450     08/19/14 0300  vancomycin (VANCOCIN) IVPB 750 mg/150 ml premix  Status:  Discontinued     750 mg150 mL/hr over 60 Minutes Intravenous Every 12 hours 08/18/14 1359 08/19/14 1122   08/18/14 2000  piperacillin-tazobactam (ZOSYN) IVPB 3.375 g     3.375 g12.5 mL/hr over 240 Minutes Intravenous Every 8 hours 08/18/14 1359     08/18/14 1315  piperacillin-tazobactam (ZOSYN) IVPB 3.375 g     3.375 g100 mL/hr over 30 Minutes Intravenous  Once 08/18/14 1300 08/18/14 1425   08/18/14 1315  vancomycin (VANCOCIN) IVPB 1000 mg/200 mL premix     1,000 mg200 mL/hr over 60 Minutes Intravenous  Once 08/18/14 1300 08/18/14 1527           Objective:   Filed Vitals:   08/20/14 2000 08/20/14 2337 08/21/14 0400 08/21/14 0715  BP: 104/79 102/65 88/59 80/36   Pulse: 107 97 91 96  Temp: 98.4 F (36.9 C) 98.3 F (36.8 C) 98.6 F (37 C)   TempSrc: Oral Oral Axillary   Resp:      Height:      Weight:      SpO2: 100% 97% 94% 94%    Wt Readings from Last 3 Encounters:  08/18/14 66.633 kg (146 lb 14.4 oz)  08/19/13 67.132 kg (148 lb)  08/13/13 67.132 kg (148 lb)     Intake/Output Summary (Last 24 hours) at 08/21/14 0756 Last data filed at 08/21/14 0600  Gross per 24 hour  Intake   2290 ml  Output    565 ml  Net  1725 ml     Physical Exam  Awake Alert, Oriented X 3, No new F.N deficits, Normal affect [place].AT,PERRAL Supple Neck,No JVD, No cervical lymphadenopathy appriciated.  Symmetrical Chest wall movement, Good air movement bilaterally, CTAB RRR,No Gallops,Rubs or new Murmurs, No Parasternal Heave +ve B.Sounds, Abd Soft, RUQ tenderness has GB drain in place, No organomegaly appriciated, No rebound - guarding or rigidity. No Cyanosis, Clubbing , trace edema, No new Rash or bruise , bilateral BKA   Data Review   Micro Results Recent Results (from the past 240 hour(s))  Blood Culture (routine x 2)     Status: None (Preliminary result)   Collection Time: 08/18/14  1:22 PM  Result Value Ref Range Status   Specimen Description BLOOD LEFT ANTECUBITAL  Final   Special Requests BOTTLES DRAWN AEROBIC AND ANAEROBIC 10CC  Final   Culture   Final           BLOOD CULTURE RECEIVED NO GROWTH TO DATE CULTURE WILL BE HELD FOR 5 DAYS BEFORE ISSUING A FINAL NEGATIVE REPORT Performed at [facility]    Report Status PENDING  Incomplete  Blood Culture (routine x 2)     Status: None   Collection Time: 08/18/14  1:27 PM  Result Value Ref Range Status   Specimen Description BLOOD HAND LEFT  Final   Special Requests BOTTLES DRAWN AEROBIC ONLY 5CC  Final   Culture   Final    STAPHYLOCOCCUS SPECIES (COAGULASE  NEGATIVE) Note: THE SIGNIFICANCE OF ISOLATING THIS ORGANISM FROM A SINGLE SET OF BLOOD CULTURES WHEN MULTIPLE SETS ARE DRAWN IS UNCERTAIN. PLEASE NOTIFY THE MICROBIOLOGY DEPARTMENT WITHIN ONE WEEK IF SPECIATION AND SENSITIVITIES ARE REQUIRED. Note: Gram Stain Report Called to,Read Back By and Verified With: ALEX SOLOMON 08/19/14 1435 BY SMITHERSJ Performed at [facility]    Report Status 08/20/2014 FINAL  Final  Urine culture     Status: None   Collection Time: 08/18/14  1:53 PM  Result Value Ref Range Status   Specimen Description URINE, CATHETERIZED  Final   Special Requests NONE  Final   Colony Count   Final    >=100,000 COLONIES/ML Performed at [facility]    Culture   Final    PROTEUS MIRABILIS Performed at [facility]    Report Status 08/20/2014 FINAL  Final   Organism ID, Bacteria PROTEUS MIRABILIS  Final      Susceptibility   Proteus mirabilis - MIC*    AMPICILLIN <=2 SENSITIVE Sensitive     CEFAZOLIN <=4 SENSITIVE Sensitive     CEFTRIAXONE <=1 SENSITIVE Sensitive     CIPROFLOXACIN <=0.25 SENSITIVE Sensitive     GENTAMICIN <=1 SENSITIVE Sensitive     LEVOFLOXACIN <=0.12 SENSITIVE Sensitive     NITROFURANTOIN 128 RESISTANT Resistant     TOBRAMYCIN <=1 SENSITIVE Sensitive     TRIMETH/SULFA <=20 SENSITIVE Sensitive     PIP/TAZO <=4 SENSITIVE Sensitive     * PROTEUS MIRABILIS  MRSA PCR Screening     Status: None   Collection Time: 08/18/14  6:02 PM  Result Value Ref Range Status   MRSA by PCR NEGATIVE NEGATIVE Final    Comment:        The GeneXpert MRSA Assay (FDA approved for NASAL specimens only), is one component of a comprehensive MRSA colonization surveillance program. It is not intended to diagnose MRSA infection nor to guide or monitor treatment for MRSA infections.   Body fluid culture     Status: None (Preliminary result)  Collection Time: 08/19/14  4:09 PM  Result Value Ref Range Status   Specimen Description BILE  Final    Special Requests NONE  Final   Gram Stain   Final    NO WBC SEEN MODERATE GRAM POSITIVE COCCI IN CHAINS Performed at [facility]    Culture   Final    Culture reincubated for better growth Performed at [facility]    Report Status PENDING  Incomplete    Radiology Reports Ct Abdomen Pelvis W Contrast  08/18/2014   CLINICAL DATA:  Periumbilical and right upper quadrant pain for 2 days. Elevated white blood cell count and elevated lactic acid.  EXAM: CT ABDOMEN AND PELVIS WITH CONTRAST  TECHNIQUE: Multidetector CT imaging of the abdomen and pelvis was performed using the standard protocol following bolus administration of intravenous contrast.  CONTRAST:  100 mL OMNIPAQUE IOHEXOL 300 MG/ML  SOLN  COMPARISON:  CT abdomen and pelvis 10/09/2010.  FINDINGS: Mild dependent atelectasis is present in the lung bases. Heart size is enlarged. There is calcific coronary artery disease.  The gallbladder is markedly dilated with wall thickening and surrounding inflammatory change. The liver is diffusely low attenuating with a 1.4 cm cyst on image 15 which is unchanged in appearance. The biliary tree, adrenal glands, spleen and pancreas appear normal. Bilateral renal cysts are noted.  The patient has aortoiliac atherosclerosis. The right common iliac artery is dilated at 1.8 cm. The left common iliac artery measures 1.5 cm. There is a large volume of stool in the cecum. The colon is otherwise unremarkable. The stomach and small bowel appear normal. No lymphadenopathy is identified. Small fat containing umbilical hernia is noted.  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 No other focal bony lesion is identified.  IMPRESSION: Findings highly suspicious for acute cholecystitis. Right upper quadrant ultrasound could be used for further evaluation.  Fatty infiltration of the liver.  Calcific aortic and coronary atherosclerosis. 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  Small fat containing umbilical hernia.  Avascular necrosis of the femoral heads bilaterally.   Electronically Signed   By: Inge Rise M.D.   On: 08/18/2014 16:23   Dg Chest Portable 1 View  08/18/2014   CLINICAL DATA:  Initial encounter for abdominal pain.  Ex-smoker.  EXAM: PORTABLE CHEST - 1 VIEW  COMPARISON:  12/16/2013  FINDINGS: Numerous leads and wires project over the chest. Midline trachea. Moderate cardiomegaly. No pleural effusion or pneumothorax. mild subsegmental atelectasis at the left lung base. No gross free intraperitoneal air. Convex right thoracolumbar spine curvature.  IMPRESSION: Cardiomegaly, without acute disease.   Electronically Signed   By: Abigail Miyamoto M.D.   On: 08/18/2014 13:58   Ir Percutaneous Transhepatic Cholangiogram  08/19/2014   INDICATION: 74 year old male with a history of acute cholecystitis. He has been referred for evaluation for cholecystostomy placement.  EXAM: ULTRASOUND AND FLUOROSCOPIC-GUIDED CHOLECYSTOSTOMY TUBE PLACEMENT  COMPARISON:  None.  MEDICATIONS: Fentanyl 125 mcg IV; Versed 0 mg IV; The patient is currently admitted to the hospital and on intravenous antibiotics. 500 mg of Levaquin were administered periprocedure.  25 mg Demerol IV  ANESTHESIA/SEDATION: Total Moderate Sedation Time  Zero minutes  CONTRAST:  9mL OMNIPAQUE IOHEXOL 300 MG/ML  SOLN  FLUOROSCOPY TIME:  minutes  COMPLICATIONS: None immediate.  PROCEDURE: Informed written consent was obtained from the patient after a discussion of the risks, benefits and alternatives to  treatment. Questions regarding the procedure were encouraged and answered. A timeout was performed prior to the initiation of the procedure.  The right upper abdominal quadrant was prepped and draped in the usual sterile fashion, and a sterile drape was applied covering the operative field. Maximum barrier sterile technique with sterile gowns and gloves  were used for the procedure. A timeout was performed prior to the initiation of the procedure. Local anesthesia was provided with 1% lidocaine with epinephrine.  Ultrasound scanning of the right upper quadrant demonstrates a markedly dilated gallbladder. Utilizing a transhepatic approach, a 22 gauge needle was advanced into the gallbladder under direct ultrasound guidance. An ultrasound image was saved for documentation purposes. Appropriate intraluminal puncture was confirmed with the efflux of bile and advancement of an 0.018 wire into the gallbladder lumen. The needle was exchanged for an Burchinal set. A small amount of contrast was injected to confirm appropriate intraluminal positioning. Over a Benson wire, a 24.2-French Cook cholecystomy tube was advanced into the gallbladder fossa, coiled and locked. Bile was aspirated and a small amount of contrast was injected as several post procedural spot radiographic images were obtained in various obliquities. The catheter was secured to the skin with suture, connected to a drainage bag and a dressing was placed.  The patient tolerated the procedure well without immediate post procedural complication.  IMPRESSION: Status post percutaneous cholecystostomy tube placement. A sample of fluid was sent to the lab for analysis.  Signed,  Dulcy Fanny. Earleen Newport, DO  [facility]  [facility]   Electronically Signed   By: Corrie Mckusick D.O.   On: 08/19/2014 16:18   US Abdomen Limited Ruq  08/19/2014   CLINICAL DATA:  Right upper quadrant abdominal pain.  EXAM: US ABDOMEN LIMITED - RIGHT UPPER QUADRANT  COMPARISON:  None.  FINDINGS: Gallbladder:  Gallbladder is moderately distended. Multiple stones and sludge are demonstrated in the gallbladder. Largest stone measures about 8 mm diameter. Focal area of mild gallbladder wall thickening up to 3.3 mm. Murphy's sign is negative.  Common bile duct:  Diameter: 6.3 mm, upper limits normal.  Mild intrahepatic bile duct dilatation is suggested. No filling defects are demonstrated although portions of the bowel duct are obscured by bowel gas.  Liver:  With diffusely increased coarsened liver echotexture likely representing fatty infiltration. No focal lesion definitively identified.  IMPRESSION: Cholelithiasis and gallbladder sludge with mild gallbladder wall thickening and distention. Changes may indicate acute cholecystitis although Murphy's sign is negative. Mild intrahepatic bile duct dilatation of nonspecific etiology.   Electronically Signed   By: Lucienne Capers M.D.   On: 08/19/2014 06:08     CBC  Recent Labs Lab 08/18/14 1250 08/18/14 1340 08/19/14 0643 08/20/14 0305 08/21/14 0310  WBC 19.9*  --  18.0* 10.2 8.6  HGB 16.1 18.7* 14.6 11.4* 12.7*  HCT 47.4 55.0* 42.7 34.0* 38.3*  PLT 256  --  205 188 211  MCV 79.0  --  77.5* 76.9* 79.3  MCH 26.8  --  26.5 25.8* 26.3  MCHC 34.0  --  34.2 33.5 33.2  RDW 16.7*  --  16.5* 16.6* 17.1*  LYMPHSABS 1.2  --   --   --   --   MONOABS 1.4*  --   --   --   --   EOSABS 0.0  --   --   --   --   BASOSABS 0.0  --   --   --   --  Chemistries   Recent Labs Lab 08/18/14 1250 08/18/14 1340 08/19/14 0643 08/20/14 0305 08/21/14 0310  NA 135 135 138 135 135  K 4.3 4.3 3.7 3.4* 4.7  CL 102 103 107 107 110  CO2 18*  --  22 22 21   GLUCOSE 311* 318* 72 196* 132*  BUN 21 26* 17 18 22   CREATININE 1.25 1.10 0.76 0.97 1.06  CALCIUM 9.2  --  8.3* 7.7* 7.8*  AST 34  --  459* 126* 41*  ALT 44  --  324* 179* 96*  ALKPHOS 146*  --  219* 174* 125*  BILITOT 1.4*  --  3.1* 3.7* 1.3*   ------------------------------------------------------------------------------------------------------------------ estimated creatinine clearance is 59.3 mL/min (by C-G formula based on Cr of 1.06). ------------------------------------------------------------------------------------------------------------------  Recent Labs  08/19/14 0643  HGBA1C  8.9*   ------------------------------------------------------------------------------------------------------------------ No results for input(s): CHOL, HDL, LDLCALC, TRIG, CHOLHDL, LDLDIRECT in the last 72 hours. ------------------------------------------------------------------------------------------------------------------ No results for input(s): TSH, T4TOTAL, T3FREE, THYROIDAB in the last 72 hours.  Invalid input(s): FREET3 ------------------------------------------------------------------------------------------------------------------ No results for input(s): VITAMINB12, FOLATE, FERRITIN, TIBC, IRON, RETICCTPCT in the last 72 hours.  Coagulation profile  Recent Labs Lab 08/19/14 0820  INR 1.45    No results for input(s): DDIMER in the last 72 hours.  Cardiac Enzymes  Recent Labs Lab 08/18/14 1850 08/18/14 2327 08/19/14 0643  TROPONINI 0.08* 0.06* 0.06*   ------------------------------------------------------------------------------------------------------------------ Invalid input(s): POCBNP     Time Spent in minutes  35   Ary Rudnick K M.D on 08/21/2014 at 7:56 AM  Between 7am to 7pm - Pager - [phone]  After 7pm go to [email or web address] - [facility] Office  [phone]

## 2014-08-21 NOTE — Progress Notes (Signed)
0830:  CBG 96, pt is eating breakfast at this time, nurse will continue to monitor  0815:  Post second CBG 0815 of 69 nurse administered 3 glucose tablets, nurse will recheck CBG at 0830

## 2014-08-21 NOTE — Progress Notes (Signed)
Referring Physician(s): Ardis Hughs  Subjective:  Perc cholecystostomy drain placed 1/29 Draining well Better daily Scheduled for cholangiogram to evaluate CBD in IR 2/1   Allergies: Bee venom and Influenza vaccines  Medications: Prior to Admission medications   Medication Sig Start Date End Date Taking? Authorizing Provider  acetaminophen-codeine (TYLENOL #4) 300-60 MG per tablet Take 1 tablet by mouth 3 (three) times daily. Patient taking differently: Take 1 tablet by mouth 2 (two) times daily as needed for moderate pain or severe pain.  05/31/14  Yes Charlett Blake, MD  aspirin EC 325 MG tablet Take 325 mg by mouth daily.   Yes Historical Provider, MD  atorvastatin (LIPITOR) 40 MG tablet TAKE 1 TABLET (40 MG TOTAL) BY MOUTH AT BEDTIME.   Yes Colon Branch, MD  baclofen (LIORESAL) 10 MG tablet TAKE 0.5 TABLETS (5 MG TOTAL) BY MOUTH 2 (TWO) TIMES DAILY 05/31/14  Yes Charlett Blake, MD  benazepril (LOTENSIN) 10 MG tablet TAKE 1 TABLET BY MOUTH EVERY DAY   Yes Colon Branch, MD  diclofenac sodium (VOLTAREN) 1 % GEL Apply 1 application topically 2 (two) times daily as needed (pain). For pain 03/09/12  Yes Charlett Blake, MD  insulin aspart protamine- aspart (NOVOLOG MIX 70/30) (70-30) 100 UNIT/ML injection Inject 15-45 Units into the skin 2 (two) times daily with a meal. Take 45 units in the morning with breakfast and take 15 units at supper   Yes Historical Provider, MD  metoprolol succinate (TOPROL-XL) 25 MG 24 hr tablet TAKE 1.5 TABLETS (37.5 MG TOTAL) BY MOUTH DAILY.   Yes Colon Branch, MD  pantoprazole (PROTONIX) 40 MG tablet TAKE 1 TABLET BY MOUTH EVERY DAY AT 12 NOON 07/25/14  Yes Colon Branch, MD  gabapentin (NEURONTIN) 300 MG capsule Take 2 capsules (600 mg total) by mouth 3 (three) times daily. Patient not taking: Reported on 08/18/2014 02/11/14   Dudley Major, DO  glucagon (GLUCAGEN) 1 MG SOLR injection Inject 1 mg into the vein once as needed for low blood sugar.    Historical  Provider, MD  mupirocin ointment (BACTROBAN) 2 % Apply 1 application topically 2 (two) times daily. Patient not taking: Reported on 08/18/2014 05/25/14   Colon Branch, MD    Review of Systems  Vital Signs: BP 107/74 mmHg  Pulse 108  Temp(Src) 98.6 F (37 C) (Axillary)  Resp 22  Ht 5\' 11"  (1.803 m)  Wt 66.633 kg (146 lb 14.4 oz)  BMI 20.50 kg/m2  SpO2 96%  Physical Exam  Abdominal:  Perc chole drain intact Draining well Site clean and dry NT no bleeding Output 140 cc yesterday 30 cc in bag: bile Cx: G+ cocci Wbc 8.6 TB: 1.3 (3.7) afeb VSS     Imaging: Ct Abdomen Pelvis W Contrast  08/18/2014   CLINICAL DATA:  Periumbilical and right upper quadrant pain for 2 days. Elevated white blood cell count and elevated lactic acid.  EXAM: CT ABDOMEN AND PELVIS WITH CONTRAST  TECHNIQUE: Multidetector CT imaging of the abdomen and pelvis was performed using the standard protocol following bolus administration of intravenous contrast.  CONTRAST:  100 mL OMNIPAQUE IOHEXOL 300 MG/ML  SOLN  COMPARISON:  CT abdomen and pelvis 10/09/2010.  FINDINGS: Mild dependent atelectasis is present in the lung bases. Heart size is enlarged. There is calcific coronary artery disease.  The gallbladder is markedly dilated with wall thickening and surrounding inflammatory change. The liver is diffusely low attenuating with a 1.4 cm cyst on  image 15 which is unchanged in appearance. The biliary tree, adrenal glands, spleen and pancreas appear normal. Bilateral renal cysts are noted.  The patient has aortoiliac atherosclerosis. The right common iliac artery is dilated at 1.8 cm. The left common iliac artery measures 1.5 cm. There is a large volume of stool in the cecum. The colon is otherwise unremarkable. The stomach and small bowel appear normal. No lymphadenopathy is identified. Small fat containing umbilical hernia is noted.  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 No other focal bony  lesion is identified.  IMPRESSION: Findings highly suspicious for acute cholecystitis. Right upper quadrant ultrasound could be used for further evaluation.  Fatty infiltration of the liver.  Calcific aortic and coronary atherosclerosis. 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  Small fat containing umbilical hernia.  Avascular necrosis of the femoral heads bilaterally.   Electronically Signed   By: Inge Rise M.D.   On: 08/18/2014 16:23   Dg Chest Portable 1 View  08/18/2014   CLINICAL DATA:  Initial encounter for abdominal pain.  Ex-smoker.  EXAM: PORTABLE CHEST - 1 VIEW  COMPARISON:  12/16/2013  FINDINGS: Numerous leads and wires project over the chest. Midline trachea. Moderate cardiomegaly. No pleural effusion or pneumothorax. mild subsegmental atelectasis at the left lung base. No gross free intraperitoneal air. Convex right thoracolumbar spine curvature.  IMPRESSION: Cardiomegaly, without acute disease.   Electronically Signed   By: Abigail Miyamoto M.D.   On: 08/18/2014 13:58   Ir Percutaneous Transhepatic Cholangiogram  08/19/2014   INDICATION: 74 year old male with a history of acute cholecystitis. He has been referred for evaluation for cholecystostomy placement.  EXAM: ULTRASOUND AND FLUOROSCOPIC-GUIDED CHOLECYSTOSTOMY TUBE PLACEMENT  COMPARISON:  None.  MEDICATIONS: Fentanyl 125 mcg IV; Versed 0 mg IV; The patient is currently admitted to the hospital and on intravenous antibiotics. 500 mg of Levaquin were administered periprocedure.  25 mg Demerol IV  ANESTHESIA/SEDATION: Total Moderate Sedation Time  Zero minutes  CONTRAST:  82mL OMNIPAQUE IOHEXOL 300 MG/ML  SOLN  FLUOROSCOPY TIME:  minutes  COMPLICATIONS: None immediate.  PROCEDURE: Informed written consent was obtained from the patient after a discussion of the risks, benefits and alternatives to treatment. Questions regarding the procedure were encouraged and answered. A  timeout was performed prior to the initiation of the procedure.  The right upper abdominal quadrant was prepped and draped in the usual sterile fashion, and a sterile drape was applied covering the operative field. Maximum barrier sterile technique with sterile gowns and gloves were used for the procedure. A timeout was performed prior to the initiation of the procedure. Local anesthesia was provided with 1% lidocaine with epinephrine.  Ultrasound scanning of the right upper quadrant demonstrates a markedly dilated gallbladder. Utilizing a transhepatic approach, a 22 gauge needle was advanced into the gallbladder under direct ultrasound guidance. An ultrasound image was saved for documentation purposes. Appropriate intraluminal puncture was confirmed with the efflux of bile and advancement of an 0.018 wire into the gallbladder lumen. The needle was exchanged for an [employer] set. A small amount of contrast was injected to confirm appropriate intraluminal positioning. Over a Benson wire, a 30.2-French Cook cholecystomy tube was advanced into the gallbladder fossa, coiled and locked. Bile was aspirated and a small amount of contrast was injected as several post procedural spot radiographic images were obtained in various obliquities. The catheter was secured to the skin with suture, connected  to a drainage bag and a dressing was placed.  The patient tolerated the procedure well without immediate post procedural complication.  IMPRESSION: Status post percutaneous cholecystostomy tube placement. A sample of fluid was sent to the lab for analysis.  Signed,  Dulcy Fanny. Earleen Newport, DO  [facility]  [facility]   Electronically Signed   By: Corrie Mckusick D.O.   On: 08/19/2014 16:18   US Abdomen Limited Ruq  08/19/2014   CLINICAL DATA:  Right upper quadrant abdominal pain.  EXAM: US ABDOMEN LIMITED - RIGHT UPPER QUADRANT  COMPARISON:  None.  FINDINGS: Gallbladder:  Gallbladder is  moderately distended. Multiple stones and sludge are demonstrated in the gallbladder. Largest stone measures about 8 mm diameter. Focal area of mild gallbladder wall thickening up to 3.3 mm. Murphy's sign is negative.  Common bile duct:  Diameter: 6.3 mm, upper limits normal. Mild intrahepatic bile duct dilatation is suggested. No filling defects are demonstrated although portions of the bowel duct are obscured by bowel gas.  Liver:  With diffusely increased coarsened liver echotexture likely representing fatty infiltration. No focal lesion definitively identified.  IMPRESSION: Cholelithiasis and gallbladder sludge with mild gallbladder wall thickening and distention. Changes may indicate acute cholecystitis although Murphy's sign is negative. Mild intrahepatic bile duct dilatation of nonspecific etiology.   Electronically Signed   By: Lucienne Capers M.D.   On: 08/19/2014 06:08    Labs:  CBC:  Recent Labs  08/18/14 1250 08/18/14 1340 08/19/14 0643 08/20/14 0305 08/21/14 0310  WBC 19.9*  --  18.0* 10.2 8.6  HGB 16.1 18.7* 14.6 11.4* 12.7*  HCT 47.4 55.0* 42.7 34.0* 38.3*  PLT 256  --  205 188 211    COAGS:  Recent Labs  08/19/14 0820  INR 1.45  APTT 39*    BMP:  Recent Labs  08/18/14 1250 08/18/14 1340 08/19/14 0643 08/20/14 0305 08/21/14 0310  NA 135 135 138 135 135  K 4.3 4.3 3.7 3.4* 4.7  CL 102 103 107 107 110  CO2 18*  --  22 22 21   GLUCOSE 311* 318* 72 196* 132*  BUN 21 26* 17 18 22   CALCIUM 9.2  --  8.3* 7.7* 7.8*  CREATININE 1.25 1.10 0.76 0.97 1.06  GFRNONAA 56*  --  89* 81* 68*  GFRAA 65*  --  >90 >90 79*    LIVER FUNCTION TESTS:  Recent Labs  08/18/14 1250 08/19/14 0643 08/20/14 0305 08/21/14 0310  BILITOT 1.4* 3.1* 3.7* 1.3*  AST 34 459* 126* 41*  ALT 44 324* 179* 96*  ALKPHOS 146* 219* 174* 125*  PROT 8.1 6.9 5.6* 5.4*  ALBUMIN 3.4* 2.8* 2.2* 2.0*    Assessment and Plan:  Perc cholecystostomy drain in place Will need to stay for 6-8  weeks Plan per Dr Ardis Hughs Pt scheduled/consented for cholangiogram in IR 2/1    I spent a total of 15 minutes face to face in clinical consultation/evaluation, greater than 50% of which was counseling/coordinating care for perc chole drain  Signed: Subrena Devereux A 08/21/2014, 10:00 AM

## 2014-08-21 NOTE — Progress Notes (Addendum)
[facility] Progress Note    Since last GI note: "moderate" gram positive cocci in chains growing from GB drain fluid, preliminary test result.  Feeling pretty well, still pain in right abdomen.  Asking about when he can go home.  Objective: Vital signs in last 24 hours: Temp:  [98.3 F (36.8 C)-98.7 F (37.1 C)] 98.6 F (37 C) (01/31 0400) Pulse Rate:  [91-108] 108 (01/31 0800) BP: (80-107)/(36-79) 107/74 mmHg (01/31 0800) SpO2:  [94 %-100 %] 96 % (01/31 0800) Last BM Date: 08/20/14 General: alert and oriented times 3 Heart: regular rate and rythm Abdomen: soft, mildly tender right abd, less tender than yesterday, non-distended, normal bowel sounds   Lab Results:  Recent Labs  08/19/14 0643 08/20/14 0305 08/21/14 0310  WBC 18.0* 10.2 8.6  HGB 14.6 11.4* 12.7*  PLT 205 188 211  MCV 77.5* 76.9* 79.3    Recent Labs  08/19/14 0643 08/20/14 0305 08/21/14 0310  NA 138 135 135  K 3.7 3.4* 4.7  CL 107 107 110  CO2 22 22 21   GLUCOSE 72 196* 132*  BUN 17 18 22   CREATININE 0.76 0.97 1.06  CALCIUM 8.3* 7.7* 7.8*    Recent Labs  08/19/14 0643 08/20/14 0305 08/21/14 0310  PROT 6.9 5.6* 5.4*  ALBUMIN 2.8* 2.2* 2.0*  AST 459* 126* 41*  ALT 324* 179* 96*  ALKPHOS 219* 174* 125*  BILITOT 3.1* 3.7* 1.3*    Recent Labs  08/19/14 0820  INR 1.45    Medications: Scheduled Meds: . aspirin  81 mg Oral Daily  . baclofen  5 mg Oral BID  . feeding supplement (PRO-STAT SUGAR FREE 64)  30 mL Oral TID WC  . heparin  5,000 Units Subcutaneous 3 times per day  . insulin aspart  0-9 Units Subcutaneous 3 times per day  . pantoprazole (PROTONIX) IV  40 mg Intravenous Q24H  . piperacillin-tazobactam (ZOSYN)  IV  3.375 g Intravenous Q8H  . sodium chloride  500 mL Intravenous Once   Continuous Infusions: . sodium chloride     PRN Meds:.acetaminophen-codeine, alum & mag hydroxide-simeth, morphine injection, nitroGLYCERIN, [DISCONTINUED] ondansetron **OR**  ondansetron (ZOFRAN) IV, sodium chloride    Assessment/Plan: 74 y.o. male acute cholecystitis s/p perc GB drain 2 days ago, elevated LFTs, slightly dilated biliary tree  Will ask IR to use GB drain to obtain cholangiogram tomorrow; if CBD stone present then he will need ERCP (this admission?).  I've ordered. Will advance diet today but NPO after MN for cholangiogram tomorrow. Continue broad spect Abx, awaiting ID and sensitivity of GPC.  Milus Banister, MD  08/21/2014, 8:51 AM [facility] Pager [phone]

## 2014-08-21 NOTE — Consult Note (Signed)
Name: Jaime [name] MRN: [id number] DOB: 02-18-1941    ADMISSION DATE:  08/18/2014 CONSULTATION DATE:  08/21/2013  REFERRING MD :  Candiss Norse  CHIEF COMPLAINT:  Resp distress  BRIEF PATIENT DESCRIPTION: 74 year old male with multiple medical problems was admitted to [facility] 1/28 with acute cholecystitis. He has been treated with perc cholecystostomy tube and antibiotics.   SIGNIFICANT EVENTS  1/29 perc drain placed  STUDIES:  CT abd 1/28> findings highly suspicious for acute cholecystitis. Avascular necrosis of the bilateral femoral heads 1/29 RUQ [place] > Cholelithiasis and gallbladder sludge with mild gallbladder wall thickening and distention. Changes may indicate acute cholecystitis although Murphy's sign is negative. 1/29 Echo > LVEF 35-40% with diffuse hypokinesis.    HISTORY OF PRESENT ILLNESS:  74 year old male with PMH as below, which includes ischemic cardiomyopathy, PAH, systolic HF (LVEF 42-59%), DM, bilateral BKA, and MS. He has a chronic indwelling foley catheter. 1/27 he began to develop abdominal pain with associated nausea and vomiting. He presented to [facility] ED 1/28 with this complaint. He was noted to he hypotensive with elevated lactic acid at that time. He was provided IVF resuscitation and BP improved. He underwent CT abd, which revealed suspicion for acute cholecystitis. Surgical consultation concluded that he is a poor candidate for cholecystectomy, and recommended percutaneous cholecystostomy tube. He was started on intravenous antibiotics and admitted to the hospitalist team. On 1/29 percutaneous drain was placed. 1/30 his urine culture was positive for proteus mirabilis and 1/2 blood cultures were positive for staph. Antibiotics were broadened. 1/31 he began having some alteration in mental status and respiratory distress. Given history of cardiac disease, primary team provided diuresis and ordered chest xray. PCCM was consulted for further eval.   PAST MEDICAL HISTORY :   has a  past medical history of Hyperlipidemia; Cardiomyopathy, ischemic; Pulmonary hypertension; Systolic heart failure; Multiple sclerosis (DX 1993); Lung nodule; Tobacco abuse; Increased prostate specific antigen (PSA) velocity; Urinary retention; Carotid artery disease; Chronic indwelling Foley catheter; Scoliosis associated with other condition; Hemiparesis; Fatigue (SEVERE); Impotence; MI, acute, non ST segment elevation (2007); Diabetes mellitus; Hypertension; H/O pleural effusion (2008); History of colon polyps (PRECANCEROUS); Insomnia; CAD (coronary artery disease) (CARDIOLOGIST- DR KATZ-- VISIT 06-05-2011 IN [facility]); Urinary tract infection; Atherosclerotic PVD with ulceration; and PAC (premature atrial contraction).  has past surgical history that includes Hernia repair (1990); Thoracentesis (2008); Cystoscopy (07/30/2011); Transurethral resection of prostate (07/30/2011); Femoral-tibial Bypass Graft (03/11/2012); Intraoperative arteriogram (03/11/2012); Femoral-popliteal Bypass Graft (03/11/2012); Amputation (03/17/2012); Coronary angioplasty with stent (05-01-2006); Amputation (06/03/2012); lower extremity angiogram (Bilateral, 12/17/2011); abdominal angiogram (12/17/2011); and abdominal aortagram (N/A, 08/19/2013). Prior to Admission medications   Medication Sig Start Date End Date Taking? Authorizing Provider  acetaminophen-codeine (TYLENOL #4) 300-60 MG per tablet Take 1 tablet by mouth 3 (three) times daily. Patient taking differently: Take 1 tablet by mouth 2 (two) times daily as needed for moderate pain or severe pain.  05/31/14  Yes Charlett Blake, MD  aspirin EC 325 MG tablet Take 325 mg by mouth daily.   Yes Historical Provider, MD  atorvastatin (LIPITOR) 40 MG tablet TAKE 1 TABLET (40 MG TOTAL) BY MOUTH AT BEDTIME.   Yes Colon Branch, MD  baclofen (LIORESAL) 10 MG tablet TAKE 0.5 TABLETS (5 MG TOTAL) BY MOUTH 2 (TWO) TIMES DAILY 05/31/14  Yes Charlett Blake, MD  benazepril (LOTENSIN) 10 MG tablet  TAKE 1 TABLET BY MOUTH EVERY DAY   Yes Colon Branch, MD  diclofenac sodium (VOLTAREN) 1 %  GEL Apply 1 application topically 2 (two) times daily as needed (pain). For pain 03/09/12  Yes Charlett Blake, MD  insulin aspart protamine- aspart (NOVOLOG MIX 70/30) (70-30) 100 UNIT/ML injection Inject 15-45 Units into the skin 2 (two) times daily with a meal. Take 45 units in the morning with breakfast and take 15 units at supper   Yes Historical Provider, MD  metoprolol succinate (TOPROL-XL) 25 MG 24 hr tablet TAKE 1.5 TABLETS (37.5 MG TOTAL) BY MOUTH DAILY.   Yes Colon Branch, MD  pantoprazole (PROTONIX) 40 MG tablet TAKE 1 TABLET BY MOUTH EVERY DAY AT 12 NOON 07/25/14  Yes Colon Branch, MD  gabapentin (NEURONTIN) 300 MG capsule Take 2 capsules (600 mg total) by mouth 3 (three) times daily. Patient not taking: Reported on 08/18/2014 02/11/14   Dudley Major, DO  glucagon (GLUCAGEN) 1 MG SOLR injection Inject 1 mg into the vein once as needed for low blood sugar.    Historical Provider, MD  mupirocin ointment (BACTROBAN) 2 % Apply 1 application topically 2 (two) times daily. Patient not taking: Reported on 08/18/2014 05/25/14   Colon Branch, MD   Allergies  Allergen Reactions  . Bee Venom Anaphylaxis  . Influenza Vaccines Other (See Comments)    Sick for months    FAMILY HISTORY:  family history includes COPD in his father; Diabetes in his brother and daughter; Heart attack in his brother; Heart attack (age of onset: 63) in his mother; Heart disease in his brother and mother; Hyperlipidemia in his mother; Hypertension in his brother, mother, and another family member; Peripheral vascular disease in his father; Stroke in his mother. There is no history of Colon cancer or Prostate cancer. SOCIAL HISTORY:  reports that he quit smoking about 2 years ago. He quit smokeless tobacco use about 5 years ago. He reports that he does not drink alcohol or use illicit drugs.  REVIEW OF SYSTEMS:   Bolds are positive    Constitutional: weight loss, gain, night sweats, Fevers, chills, fatigue .  HEENT: headaches, Sore throat, sneezing, nasal congestion, post nasal drip, Difficulty swallowing, Tooth/dental problems, visual complaints visual changes, ear ache CV:  chest pain, radiates: ,Orthopnea, PND, swelling in lower extremities, dizziness, palpitations, syncope.  GI  heartburn, indigestion, abdominal pain, nausea, vomiting, diarrhea, change in bowel habits, loss of appetite, bloody stools.  Resp: cough, productive: , hemoptysis, dyspnea, chest pain, pleuritic.  Skin: rash or itching or icterus GU: dysuria, change in color of urine, urgency or frequency. flank pain, hematuria  MS: joint pain or swelling. decreased range of motion  Psych: change in mood or affect. depression or anxiety.  Neuro: difficulty with speech, weakness, numbness, ataxia    SUBJECTIVE:   VITAL SIGNS: Temp:  [98 F (36.7 C)-98.6 F (37 C)] 98 F (36.7 C) (01/31 1624) Pulse Rate:  [85-108] 97 (01/31 1800) Resp:  [18-28] 25 (01/31 1800) BP: (80-126)/(36-79) 108/65 mmHg (01/31 1800) SpO2:  [90 %-100 %] 97 % (01/31 1800)  PHYSICAL EXAMINATION: General:  Male of normal body habitus in NAD Neuro:  Alert, oriented, non focal.  HEENT:  [place]/AT, PERRL, no JVD noted Cardiovascular: RRR, no MRG Lungs:  Clear bilateral breath sounds. Abdomen:  Mildly distended, soft, generalized tenderness.  Musculoskeletal:  B/l BKA. No acute deformity or ROM limitation Skin:  Grossly intact.    Recent Labs Lab 08/19/14 0643 08/20/14 0305 08/21/14 0310  NA 138 135 135  K 3.7 3.4* 4.7  CL 107 107 110  CO2  22 22 21   BUN 17 18 22   CREATININE 0.76 0.97 1.06  GLUCOSE 72 196* 132*    Recent Labs Lab 08/19/14 0643 08/20/14 0305 08/21/14 0310  HGB 14.6 11.4* 12.7*  HCT 42.7 34.0* 38.3*  WBC 18.0* 10.2 8.6  PLT 205 188 211   Dg Chest Port 1 View  08/21/2014   CLINICAL DATA:  74 year old male with shortness breath.  EXAM: PORTABLE CHEST -  1 VIEW  COMPARISON:  Chest x-ray 08/18/2014.  FINDINGS: Lung volumes are low. No consolidative airspace disease. No pleural effusions. No evidence of pulmonary edema. Heart size is mildly enlarged. Upper mediastinal contours are distorted by patient positioning, but are similar to prior examinations.  IMPRESSION: 1. No radiographic evidence of acute cardiopulmonary disease. 2. Mild cardiomegaly.   Electronically Signed   By: Vinnie Langton M.D.   On: 08/21/2014 15:56    ASSESSMENT / PLAN:  Dyspnea - suspect 2nd to volume in setting systolic CHF (much improved with diuresis)  Dyspnea is much improved by the time of my evaluation. He is breathing comfortably and does not report any additional dyspnea. CXR shows no signs of of pulmonary edema, or any other possible acute etiology. Given this, and the improvement with lasix, suspect CHF is the most likely cause.  - Supplemental O2 PRN to maintain SpO2 greater than 92%  - Follow CXR in AM - Check BNP, if elevated continue diuresis as BP/renal function will tolerate.  - Would be a good candidate for PRN BiPAP should this recur, assuming mentation remains intact.   Georgann Housekeeper, AGACNP-BC [facility] Pulmonology/Critical Care Pager [phone] or [phone]    Merton Border, MD ; [facility] service Mobile [phone].  After 5:30 PM or weekends, call [phone]

## 2014-08-21 NOTE — Consult Note (Signed)
CARDIOLOGY CONSULT NOTE  Patient ID: Jaime [name]  MRN: [id number]  DOB: December 26, 1940  Admit date: 08/18/2014 Date of Consult: 08/21/2014  Primary Physician: [name], MD Primary Cardiologist: [name], MD  Reason for Consultation: CHF  History of Present Illness: Jaime [name] is a 74 y.o. male with a history of ischemic cardiomyopathy, EF 30%, who presented on 1/28 with acute cholecystitis, complicated by sepsis.  He is now s/p gallbladder drain placement by IR on 08/19/14 with improvement in pain.  However he has remained hypotensive requiring IV fluids.  His BP meds have been held.  His troponin is mildly elevated, with some SOB. Echo on 1/29 showed stable EF, 35%, without significant valvular disease.   IVC was normal at that time.    On my history he is comfortable, still with abdominal pain but no chest pain.  No dyspnea.     Past Medical History  Diagnosis Date  . Hyperlipidemia   . Cardiomyopathy, ischemic     EF 45% per ECHO 2008  //   EF 25%, echo, August, 2013 //  Echo (8/15):  Mild LVH, EF 30-35%, ant-lat and lat AK, inf HK, Gr 1 DD, mild MR, mild LAE  . Pulmonary hypertension     moderate ECHO Jan 2008  . Systolic heart failure   . Multiple sclerosis [place] -- LAST VISIT 11-20-2010  NOTE W/ CHART  . Lung nodule     resolved 11-2006 CT Chest  . Tobacco abuse     quit   . Increased prostate specific antigen (PSA) velocity   . Urinary retention     dx ~ 2-12, like from [place], now with a catheter, saw urology  . Carotid artery disease     a.  Doppler, February, 2012, 0-39% bilateral,Mild smooth plaque;  b.  Carotid US (8/15):  Bilateral 1-39% ICA >>> F/u 2 years  . Chronic indwelling Foley catheter   . Scoliosis associated with other condition   . Hemiparesis   . Fatigue SEVERE  . Impotence   . MI, acute, non ST segment elevation 2007    S/P PCI WITH X1 STENT (TAXUS DRUG-ELUTING) LEFT CIRCUMFLEX  . Diabetes mellitus    INSULIN-DEPENDANT  . Hypertension   . H/O pleural effusion 2008    POST THORACENTESIS  . History of colon polyps PRECANCEROUS  . Insomnia   . CAD (coronary artery disease) CARDIOLOGIST- DR KATZ-- VISIT 06-05-2011 IN [facility]    non-STEMI, 2007.Marland Kitchenoccluded circumflex.. Taxus stent placed...residual 80% LAD...50% RCA  . Urinary tract infection     hx of  . Atherosclerotic PVD with ulceration     left foot  . PAC (premature atrial contraction)     December, 2013    Past Surgical History  Procedure Laterality Date  . Hernia repair  1990    (R)  . Thoracentesis  2008    PLEURAL EFFUSION  . Cystoscopy  07/30/2011    Procedure: CYSTOSCOPY;  Surgeon: Hanley Ben, MD;  Location: [facility];  Service: Urology;  Laterality: N/A;  . Transurethral resection of prostate  07/30/2011    Procedure: TRANSURETHRAL RESECTION OF THE PROSTATE (TURP);  Surgeon: Hanley Ben, MD;  Location: [facility];  Service: Urology;  Laterality: N/A;  . Femoral-tibial bypass graft  03/11/2012    Procedure: BYPASS GRAFT FEMORAL-TIBIAL ARTERY;  Surgeon: Conrad Yankton, MD;  Location: [place];  Service: Vascular;  Laterality: Left;  Left Femoral -  Tibial trunk bypass, Endarterectomy of Tibial- Peroneal trunk with vein angioplasty.  . Intraoperative arteriogram  03/11/2012    Procedure: INTRA OPERATIVE ARTERIOGRAM;  Surgeon: Conrad [place], MD;  Location: [place];  Service: Vascular;  Laterality: Left;  . Femoral-popliteal bypass graft  03/11/2012    Procedure: BYPASS GRAFT FEMORAL-POPLITEAL ARTERY;  Surgeon: Conrad Magnolia, MD;  Location: [place];  Service: Vascular;  Laterality: Left;  embolectomy left lower leg  . Amputation  03/17/2012    Procedure: AMPUTATION ABOVE KNEE;  Surgeon: Conrad [place], MD;  Location: [place];  Service: Vascular;  Laterality: Left;  . Coronary angioplasty with stent placement  05-01-2006    OCCLUDED CIRCUMFLEX -- TAXUS STENT PLACMENT  AND RESIDUAL 80% LAD,  50% RCA  . Amputation   06/03/2012    Procedure: AMPUTATION ABOVE KNEE;  Surgeon: Conrad Ballou, MD;  Location: [place];  Service: Vascular;  Laterality: Right;  . Lower extremity angiogram Bilateral 12/17/2011    Procedure: LOWER EXTREMITY ANGIOGRAM;  Surgeon: Serafina Mitchell, MD;  Location: [facility] CATH LAB;  Service: Cardiovascular;  Laterality: Bilateral;  bil lower extrem angio  . Abdominal angiogram  12/17/2011    Procedure: ABDOMINAL ANGIOGRAM;  Surgeon: Serafina Mitchell, MD;  Location: [facility] CATH LAB;  Service: Cardiovascular;;  . Abdominal aortagram N/A 08/19/2013    Procedure: ABDOMINAL Maxcine Ham;  Surgeon: Conrad Gower, MD;  Location: [facility] CATH LAB;  Service: Cardiovascular;  Laterality: N/A;     Home Meds: Prior to Admission medications   Medication Sig Start Date End Date Taking? Authorizing Provider  acetaminophen-codeine (TYLENOL #4) 300-60 MG per tablet Take 1 tablet by mouth 3 (three) times daily. Patient taking differently: Take 1 tablet by mouth 2 (two) times daily as needed for moderate pain or severe pain.  05/31/14  Yes Charlett Blake, MD  aspirin EC 325 MG tablet Take 325 mg by mouth daily.   Yes Historical Provider, MD  atorvastatin (LIPITOR) 40 MG tablet TAKE 1 TABLET (40 MG TOTAL) BY MOUTH AT BEDTIME.   Yes Colon Branch, MD  baclofen (LIORESAL) 10 MG tablet TAKE 0.5 TABLETS (5 MG TOTAL) BY MOUTH 2 (TWO) TIMES DAILY 05/31/14  Yes Charlett Blake, MD  benazepril (LOTENSIN) 10 MG tablet TAKE 1 TABLET BY MOUTH EVERY DAY   Yes Colon Branch, MD  diclofenac sodium (VOLTAREN) 1 % GEL Apply 1 application topically 2 (two) times daily as needed (pain). For pain 03/09/12  Yes Charlett Blake, MD  insulin aspart protamine- aspart (NOVOLOG MIX 70/30) (70-30) 100 UNIT/ML injection Inject 15-45 Units into the skin 2 (two) times daily with a meal. Take 45 units in the morning with breakfast and take 15 units at supper   Yes Historical Provider, MD  metoprolol succinate (TOPROL-XL) 25 MG 24 hr tablet TAKE 1.5 TABLETS  (37.5 MG TOTAL) BY MOUTH DAILY.   Yes Colon Branch, MD  pantoprazole (PROTONIX) 40 MG tablet TAKE 1 TABLET BY MOUTH EVERY DAY AT 12 NOON 07/25/14  Yes Colon Branch, MD  gabapentin (NEURONTIN) 300 MG capsule Take 2 capsules (600 mg total) by mouth 3 (three) times daily. Patient not taking: Reported on 08/18/2014 02/11/14   Dudley Major, DO  glucagon (GLUCAGEN) 1 MG SOLR injection Inject 1 mg into the vein once as needed for low blood sugar.    Historical Provider, MD  mupirocin ointment (BACTROBAN) 2 % Apply 1 application topically 2 (two) times daily. Patient not taking: Reported on 08/18/2014  05/25/14   Colon Branch, MD    Current Medications: . aspirin  81 mg Oral Daily  . baclofen  5 mg Oral BID  . feeding supplement (PRO-STAT SUGAR FREE 64)  30 mL Oral TID WC  . heparin  5,000 Units Subcutaneous 3 times per day  . insulin aspart  0-9 Units Subcutaneous 3 times per day  . pantoprazole (PROTONIX) IV  40 mg Intravenous Q24H  . piperacillin-tazobactam (ZOSYN)  IV  3.375 g Intravenous Q8H     Allergies:    Allergies  Allergen Reactions  . Bee Venom Anaphylaxis  . Influenza Vaccines Other (See Comments)    Sick for months    Social History:   The patient  reports that he quit smoking about 2 years ago. He quit smokeless tobacco use about 5 years ago. He reports that he does not drink alcohol or use illicit drugs.    Family History:   The patient's family history includes COPD in his father; Diabetes in his brother and daughter; Heart attack in his brother; Heart attack (age of onset: 52) in his mother; Heart disease in his brother and mother; Hyperlipidemia in his mother; Hypertension in his brother, mother, and another family member; Peripheral vascular disease in his father; Stroke in his mother. There is no history of Colon cancer or Prostate cancer.   ROS:  Please see the history of present illness.  All other systems reviewed and negative.   Vital Signs: Blood pressure 108/65, pulse 97,  temperature 98 F (36.7 C), temperature source Oral, resp. rate 25, height 5\' 11"  (1.803 m), weight 66.633 kg (146 lb 14.4 oz), SpO2 90 %.   PHYSICAL EXAM: General:  Well nourished, well developed, in no acute distress HEENT: normal Lymph: no adenopathy Neck: JVP is normal at 6cm.  HJR deferred due to abdominal pain. Endocrine:  No thryomegaly Cardiac:  normal S1, S2; RRR; no murmur Lungs:  clear to auscultation anteriorly, no wheezing, rhonchi or rales Abd: soft, nontender, no hepatomegaly Ext: trace edema, bilateral AKA Musculoskeletal:  No deformities, BUE and BLE strength normal and equal Skin: warm and dry Neuro:  CNs 2-12 intact, no focal abnormalities noted Psych:  Normal affect   EKG:  1st deg AV block (old), with IVCD, lateral t wave inversions. Labs:  Recent Labs  08/18/14 1850 08/18/14 2327 08/19/14 0643  TROPONINI 0.08* 0.06* 0.06*   Lab Results  Component Value Date   WBC 8.6 08/21/2014   HGB 12.7* 08/21/2014   HCT 38.3* 08/21/2014   MCV 79.3 08/21/2014   PLT 211 08/21/2014    Recent Labs Lab 08/21/14 0310  NA 135  K 4.7  CL 110  CO2 21  BUN 22  CREATININE 1.06  CALCIUM 7.8*  PROT 5.4*  BILITOT 1.3*  ALKPHOS 125*  ALT 96*  AST 41*  GLUCOSE 132*   Lab Results  Component Value Date   CHOL 106 11/10/2013   HDL 30.60* 11/10/2013   LDLCALC 67 11/10/2013   TRIG 44.0 11/10/2013   Lab Results  Component Value Date   DDIMER 2.50* 12/11/2011    Radiology/Studies:  Ct Abdomen Pelvis W Contrast  08/18/2014   CLINICAL DATA:  Periumbilical and right upper quadrant pain for 2 days. Elevated white blood cell count and elevated lactic acid.  EXAM: CT ABDOMEN AND PELVIS WITH CONTRAST  TECHNIQUE: Multidetector CT imaging of the abdomen and pelvis was performed using the standard protocol following bolus administration of intravenous contrast.  CONTRAST:  100 mL  OMNIPAQUE IOHEXOL 300 MG/ML  SOLN  COMPARISON:  CT abdomen and pelvis 10/09/2010.  FINDINGS:  Mild dependent atelectasis is present in the lung bases. Heart size is enlarged. There is calcific coronary artery disease.  The gallbladder is markedly dilated with wall thickening and surrounding inflammatory change. The liver is diffusely low attenuating with a 1.4 cm cyst on image 15 which is unchanged in appearance. The biliary tree, adrenal glands, spleen and pancreas appear normal. Bilateral renal cysts are noted.  The patient has aortoiliac atherosclerosis. The right common iliac artery is dilated at 1.8 cm. The left common iliac artery measures 1.5 cm. There is a large volume of stool in the cecum. The colon is otherwise unremarkable. The stomach and small bowel appear normal. No lymphadenopathy is identified. Small fat containing umbilical hernia is noted.  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 No other focal bony lesion is identified.  IMPRESSION: Findings highly suspicious for acute cholecystitis. Right upper quadrant ultrasound could be used for further evaluation.  Fatty infiltration of the liver.  Calcific aortic and coronary atherosclerosis. 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  Small fat containing umbilical hernia.  Avascular necrosis of the femoral heads bilaterally.   Electronically Signed   By: Inge Rise M.D.   On: 08/18/2014 16:23   Dg Chest Port 1 View  08/21/2014   CLINICAL DATA:  74 year old male with shortness breath.  EXAM: PORTABLE CHEST - 1 VIEW  COMPARISON:  Chest x-ray 08/18/2014.  FINDINGS: Lung volumes are low. No consolidative airspace disease. No pleural effusions. No evidence of pulmonary edema. Heart size is mildly enlarged. Upper mediastinal contours are distorted by patient positioning, but are similar to prior examinations.  IMPRESSION: 1. No radiographic evidence of acute cardiopulmonary disease. 2. Mild cardiomegaly.   Electronically Signed   By: Vinnie Langton M.D.   On: 08/21/2014 15:56   Dg Chest Portable 1 View  08/18/2014   CLINICAL DATA:  Initial encounter for abdominal pain.  Ex-smoker.  EXAM: PORTABLE CHEST - 1 VIEW  COMPARISON:  12/16/2013  FINDINGS: Numerous leads and wires project over the chest. Midline trachea. Moderate cardiomegaly. No pleural effusion or pneumothorax. mild subsegmental atelectasis at the left lung base. No gross free intraperitoneal air. Convex right thoracolumbar spine curvature.  IMPRESSION: Cardiomegaly, without acute disease.   Electronically Signed   By: Abigail Miyamoto M.D.   On: 08/18/2014 13:58   Ir Percutaneous Transhepatic Cholangiogram  08/19/2014   INDICATION: 74 year old male with a history of acute cholecystitis. He has been referred for evaluation for cholecystostomy placement.  EXAM: ULTRASOUND AND FLUOROSCOPIC-GUIDED CHOLECYSTOSTOMY TUBE PLACEMENT  COMPARISON:  None.  MEDICATIONS: Fentanyl 125 mcg IV; Versed 0 mg IV; The patient is currently admitted to the hospital and on intravenous antibiotics. 500 mg of Levaquin were administered periprocedure.  25 mg Demerol IV  ANESTHESIA/SEDATION: Total Moderate Sedation Time  Zero minutes  CONTRAST:  9mL OMNIPAQUE IOHEXOL 300 MG/ML  SOLN  FLUOROSCOPY TIME:  minutes  COMPLICATIONS: None immediate.  PROCEDURE: Informed written consent was obtained from the patient after a discussion of the risks, benefits and alternatives to treatment. Questions regarding the procedure were encouraged and answered. A timeout was performed prior to the initiation of the procedure.  The right upper abdominal quadrant was prepped and draped in the usual sterile fashion, and a sterile drape was applied covering the operative field. Maximum barrier sterile technique with  sterile gowns and gloves were used for the procedure. A timeout was performed prior to the initiation of the procedure. Local anesthesia was provided with 1% lidocaine with epinephrine.  Ultrasound scanning of the right upper  quadrant demonstrates a markedly dilated gallbladder. Utilizing a transhepatic approach, a 22 gauge needle was advanced into the gallbladder under direct ultrasound guidance. An ultrasound image was saved for documentation purposes. Appropriate intraluminal puncture was confirmed with the efflux of bile and advancement of an 0.018 wire into the gallbladder lumen. The needle was exchanged for an [place] set. A small amount of contrast was injected to confirm appropriate intraluminal positioning. Over a Benson wire, a 89.2-French Cook cholecystomy tube was advanced into the gallbladder fossa, coiled and locked. Bile was aspirated and a small amount of contrast was injected as several post procedural spot radiographic images were obtained in various obliquities. The catheter was secured to the skin with suture, connected to a drainage bag and a dressing was placed.  The patient tolerated the procedure well without immediate post procedural complication.  IMPRESSION: Status post percutaneous cholecystostomy tube placement. A sample of fluid was sent to the lab for analysis.  Signed,  Dulcy Fanny. Earleen Newport, DO  [facility]  [facility]   Electronically Signed   By: Corrie Mckusick D.O.   On: 08/19/2014 16:18   US Abdomen Limited Ruq  08/19/2014   CLINICAL DATA:  Right upper quadrant abdominal pain.  EXAM: US ABDOMEN LIMITED - RIGHT UPPER QUADRANT  COMPARISON:  None.  FINDINGS: Gallbladder:  Gallbladder is moderately distended. Multiple stones and sludge are demonstrated in the gallbladder. Largest stone measures about 8 mm diameter. Focal area of mild gallbladder wall thickening up to 3.3 mm. Murphy's sign is negative.  Common bile duct:  Diameter: 6.3 mm, upper limits normal. Mild intrahepatic bile duct dilatation is suggested. No filling defects are demonstrated although portions of the bowel duct are obscured by bowel gas.  Liver:  With diffusely increased coarsened liver  echotexture likely representing fatty infiltration. No focal lesion definitively identified.  IMPRESSION: Cholelithiasis and gallbladder sludge with mild gallbladder wall thickening and distention. Changes may indicate acute cholecystitis although Murphy's sign is negative. Mild intrahepatic bile duct dilatation of nonspecific etiology.   Electronically Signed   By: Lucienne Capers M.D.   On: 08/19/2014 06:08    ASSESSMENT AND PLAN:  1. Ischemic cardiomyopathy in setting of acute cholecystitis and sepsis. - He actually looks great to me on exam.  Breathing comfortably flat in bed, normal JVP, CXR without pulmonary edema.  He is warm on exam with stable BP. - Would continue to hold his home BP meds as he recovers from sepsis. - Do not think he needs additional lasix at this time.  Can give gentle IV fluid boluses as needed. - He denies chest pain. ECG changes (lateral TWI) and mildly elevated troponin likely sequelae of sepsis in setting of known coronary disease.  Can continue to trend troponin and ECG daily. - Do not anticipate a need for inpatient ischemic evaluation this admission.    Cleotis Lema 08/21/2014 6:15 PM

## 2014-08-21 NOTE — Progress Notes (Signed)
Subjective: Less tender in RUQ Hungry    Objective: Vital signs in last 24 hours: Temp:  [98.3 F (36.8 C)-98.7 F (37.1 C)] 98.5 F (36.9 C) (01/31 1000) Pulse Rate:  [91-108] 92 (01/31 1000) BP: (80-107)/(36-79) 107/74 mmHg (01/31 0800) SpO2:  [94 %-100 %] 96 % (01/31 0800) Last BM Date: 08/20/14  Intake/Output from previous day: 01/30 0701 - 01/31 0700 In: 2290 [I.V.:1840; IV Piggyback:450] Out: 565 [Urine:425; Drains:140] Intake/Output this shift: Total I/O In: 150 [I.V.:150] Out: -   GI: mild RUQ tenderness; Normal BS  Lab Results:   Recent Labs  08/20/14 0305 08/21/14 0310  WBC 10.2 8.6  HGB 11.4* 12.7*  HCT 34.0* 38.3*  PLT 188 211   BMET  Recent Labs  08/20/14 0305 08/21/14 0310  NA 135 135  K 3.4* 4.7  CL 107 110  CO2 22 21  GLUCOSE 196* 132*  BUN 18 22  CREATININE 0.97 1.06  CALCIUM 7.7* 7.8*   Hepatic Function Latest Ref Rng 08/21/2014 08/20/2014 08/19/2014  Total Protein 6.0 - 8.3 g/dL 5.4(L) 5.6(L) 6.9  Albumin 3.5 - 5.2 g/dL 2.0(L) 2.2(L) 2.8(L)  AST 0 - 37 U/L 41(H) 126(H) 459(H)  ALT 0 - 53 U/L 96(H) 179(H) 324(H)  Alk Phosphatase 39 - 117 U/L 125(H) 174(H) 219(H)  Total Bilirubin 0.3 - 1.2 mg/dL 1.3(H) 3.7(H) 3.1(H)  Bilirubin, Direct 0.0 - 0.3 mg/dL - - -     PT/INR  Recent Labs  08/19/14 0820  LABPROT 17.7*  INR 1.45   ABG No results for input(s): PHART, HCO3 in the last 72 hours.  Invalid input(s): PCO2, PO2  Studies/Results: Ir Percutaneous Transhepatic Cholangiogram  08/19/2014   INDICATION: 74 year old male with a history of acute cholecystitis. He has been referred for evaluation for cholecystostomy placement.  EXAM: ULTRASOUND AND FLUOROSCOPIC-GUIDED CHOLECYSTOSTOMY TUBE PLACEMENT  COMPARISON:  None.  MEDICATIONS: Fentanyl 125 mcg IV; Versed 0 mg IV; The patient is currently admitted to the hospital and on intravenous antibiotics. 500 mg of Levaquin were administered periprocedure.  25 mg Demerol IV   ANESTHESIA/SEDATION: Total Moderate Sedation Time  Zero minutes  CONTRAST:  67m OMNIPAQUE IOHEXOL 300 MG/ML  SOLN  FLUOROSCOPY TIME:  minutes  COMPLICATIONS: None immediate.  PROCEDURE: Informed written consent was obtained from the patient after a discussion of the risks, benefits and alternatives to treatment. Questions regarding the procedure were encouraged and answered. A timeout was performed prior to the initiation of the procedure.  The right upper abdominal quadrant was prepped and draped in the usual sterile fashion, and a sterile drape was applied covering the operative field. Maximum barrier sterile technique with sterile gowns and gloves were used for the procedure. A timeout was performed prior to the initiation of the procedure. Local anesthesia was provided with 1% lidocaine with epinephrine.  Ultrasound scanning of the right upper quadrant demonstrates a markedly dilated gallbladder. Utilizing a transhepatic approach, a 22 gauge needle was advanced into the gallbladder under direct ultrasound guidance. An ultrasound image was saved for documentation purposes. Appropriate intraluminal puncture was confirmed with the efflux of bile and advancement of an 0.018 wire into the gallbladder lumen. The needle was exchanged for an AFort [place]. A small amount of contrast was injected to confirm appropriate intraluminal positioning. Over a Benson wire, a 1112-French Cook cholecystomy tube was advanced into the gallbladder fossa, coiled and locked. Bile was aspirated and a small amount of contrast was injected as several post procedural spot radiographic images were obtained in various obliquities. The  catheter was secured to the skin with suture, connected to a drainage bag and a dressing was placed.  The patient tolerated the procedure well without immediate post procedural complication.  IMPRESSION: Status post percutaneous cholecystostomy tube placement. A sample of fluid was sent to the lab for analysis.   Signed,  Dulcy Fanny. Earleen Newport, DO  [facility]  [facility]   Electronically Signed   By: Corrie Mckusick D.O.   On: 08/19/2014 16:18    Anti-infectives: Anti-infectives    Start     Dose/Rate Route Frequency Ordered Stop   08/19/14 1515  levofloxacin (LEVAQUIN) IVPB 500 mg  Status:  Discontinued     500 mg100 mL/hr over 60 Minutes Intravenous Every 24 hours 08/19/14 1514 08/20/14 1616   08/19/14 1500  vancomycin (VANCOCIN) IVPB 750 mg/150 ml premix  Status:  Discontinued     750 mg150 mL/hr over 60 Minutes Intravenous Every 12 hours 08/19/14 1450 08/21/14 0807   08/19/14 0300  vancomycin (VANCOCIN) IVPB 750 mg/150 ml premix  Status:  Discontinued     750 mg150 mL/hr over 60 Minutes Intravenous Every 12 hours 08/18/14 1359 08/19/14 1122   08/18/14 2000  piperacillin-tazobactam (ZOSYN) IVPB 3.375 g     3.375 g12.5 mL/hr over 240 Minutes Intravenous Every 8 hours 08/18/14 1359     08/18/14 1315  piperacillin-tazobactam (ZOSYN) IVPB 3.375 g     3.375 g100 mL/hr over 30 Minutes Intravenous  Once 08/18/14 1300 08/18/14 1425   08/18/14 1315  vancomycin (VANCOCIN) IVPB 1000 mg/200 mL premix     1,000 mg200 mL/hr over 60 Minutes Intravenous  Once 08/18/14 1300 08/18/14 1527      Assessment/Plan: s/p percutaneous cholecystostomy for acute cholecystitis Improving Cholangiogram tomorrow per percutaneous cholecystostomy - evaluate for CBD stone  May need ERCP  LOS: 3 days    Ayona Yniguez K. 08/21/2014

## 2014-08-22 ENCOUNTER — Inpatient Hospital Stay (HOSPITAL_COMMUNITY): Payer: Medicare Other

## 2014-08-22 DIAGNOSIS — R7989 Other specified abnormal findings of blood chemistry: Secondary | ICD-10-CM | POA: Insufficient documentation

## 2014-08-22 DIAGNOSIS — R1011 Right upper quadrant pain: Secondary | ICD-10-CM | POA: Insufficient documentation

## 2014-08-22 DIAGNOSIS — R0602 Shortness of breath: Secondary | ICD-10-CM | POA: Insufficient documentation

## 2014-08-22 DIAGNOSIS — R945 Abnormal results of liver function studies: Secondary | ICD-10-CM | POA: Insufficient documentation

## 2014-08-22 LAB — GLUCOSE, CAPILLARY
GLUCOSE-CAPILLARY: 159 mg/dL — AB (ref 70–99)
GLUCOSE-CAPILLARY: 188 mg/dL — AB (ref 70–99)
Glucose-Capillary: 125 mg/dL — ABNORMAL HIGH (ref 70–99)
Glucose-Capillary: 137 mg/dL — ABNORMAL HIGH (ref 70–99)
Glucose-Capillary: 148 mg/dL — ABNORMAL HIGH (ref 70–99)
Glucose-Capillary: 235 mg/dL — ABNORMAL HIGH (ref 70–99)
Glucose-Capillary: 76 mg/dL (ref 70–99)
Glucose-Capillary: 94 mg/dL (ref 70–99)
Glucose-Capillary: 97 mg/dL (ref 70–99)

## 2014-08-22 LAB — CBC
HEMATOCRIT: 32.6 % — AB (ref 39.0–52.0)
Hemoglobin: 10.8 g/dL — ABNORMAL LOW (ref 13.0–17.0)
MCH: 26.1 pg (ref 26.0–34.0)
MCHC: 33.1 g/dL (ref 30.0–36.0)
MCV: 78.7 fL (ref 78.0–100.0)
Platelets: 223 10*3/uL (ref 150–400)
RBC: 4.14 MIL/uL — AB (ref 4.22–5.81)
RDW: 16.8 % — ABNORMAL HIGH (ref 11.5–15.5)
WBC: 8 10*3/uL (ref 4.0–10.5)

## 2014-08-22 LAB — BODY FLUID CULTURE: GRAM STAIN: NONE SEEN

## 2014-08-22 LAB — COMPREHENSIVE METABOLIC PANEL
ALBUMIN: 2 g/dL — AB (ref 3.5–5.2)
ALT: 62 U/L — AB (ref 0–53)
AST: 24 U/L (ref 0–37)
Alkaline Phosphatase: 110 U/L (ref 39–117)
Anion gap: 4 — ABNORMAL LOW (ref 5–15)
BILIRUBIN TOTAL: 1.4 mg/dL — AB (ref 0.3–1.2)
BUN: 18 mg/dL (ref 6–23)
CO2: 22 mmol/L (ref 19–32)
CREATININE: 0.88 mg/dL (ref 0.50–1.35)
Calcium: 7.7 mg/dL — ABNORMAL LOW (ref 8.4–10.5)
Chloride: 110 mmol/L (ref 96–112)
GFR, EST NON AFRICAN AMERICAN: 84 mL/min — AB (ref >90)
Glucose, Bld: 116 mg/dL — ABNORMAL HIGH (ref 70–99)
Potassium: 4 mmol/L (ref 3.5–5.1)
SODIUM: 136 mmol/L (ref 135–145)
Total Protein: 5.7 g/dL — ABNORMAL LOW (ref 6.0–8.3)

## 2014-08-22 LAB — TROPONIN I: Troponin I: 0.03 ng/mL (ref <0.031)

## 2014-08-22 MED ORDER — FUROSEMIDE 10 MG/ML IJ SOLN
20.0000 mg | Freq: Four times a day (QID) | INTRAMUSCULAR | Status: AC
  Administered 2014-08-22 (×2): 20 mg via INTRAVENOUS
  Filled 2014-08-22 (×2): qty 2

## 2014-08-22 MED ORDER — IOHEXOL 300 MG/ML  SOLN
50.0000 mL | Freq: Once | INTRAMUSCULAR | Status: AC | PRN
  Administered 2014-08-22: 20 mL via INTRAVENOUS

## 2014-08-22 MED ORDER — DEXTROSE-NACL 5-0.45 % IV SOLN
INTRAVENOUS | Status: AC
  Administered 2014-08-22: 50 mL/h via INTRAVENOUS

## 2014-08-22 NOTE — Progress Notes (Signed)
[facility] PCCM  S: feeling OK  O:  Filed Vitals:   08/22/14 0126 08/22/14 0419 08/22/14 0821 08/22/14 1246  BP: 104/62 136/73 123/71 100/67  Pulse:  81 80 82  Temp:  98.2 F (36.8 C) 98.2 F (36.8 C) 98.3 F (36.8 C)  TempSrc:  Oral Oral Oral  Resp:  23 18 23   Height:      Weight:  72.4 kg (159 lb 9.8 oz)    SpO2:  22% 99% 100%    Gen: comfortable HEENT: NCAT PULM:  CTA B CV: RRR, no mgr Ab: bS+, soft, nontender Ext: warm no edema  1/31 CXR > clear  Impression: Resolved respiratory distress Acute cholecystitis  Plan: Out of bed Continue care as you are doing  PCCM will sign off  Roselie Awkward, MD [place] [facility] Pager: [phone] Cell: [phone] If no response, call [phone]

## 2014-08-22 NOTE — Evaluation (Signed)
Physical Therapy Evaluation Patient Details Name: Jaime [name] MRN: [id number] DOB: August 18, 1940 Today's Date: 08/22/2014   History of Present Illness  Pt adm with acute cholecysitits. Percutaneous cholecystostomy tube placed on 08/19/14. PMH - Bil AKA, multiple sclerosis, PVD, cardiomyopathy, heart failure  Clinical Impression  Pt admitted with above diagnosis. Pt currently with functional limitations due to the deficits listed below (see PT Problem List).  Pt will benefit from skilled PT to increase their independence and safety with mobility to allow discharge to the venue listed below.  Spoke with pt's wife who has been providing care for pt. She has a daughter and another woman who assist her at times. Pt needs ST-SNF to maximize his ability to assist with basic mobility to decr the burden of care on wife.    Follow Up Recommendations SNF    Equipment Recommendations  None recommended by PT    Recommendations for Other Services       Precautions / Restrictions Precautions Precautions: Fall Restrictions Weight Bearing Restrictions: Yes (Bilat lower BKA )      Mobility  Bed Mobility Overal bed mobility: Needs Assistance Bed Mobility: Rolling Rolling: Total assist         General bed mobility comments: Pt able to provide a little help by reaching and holding rail with UE's. Able to reach/hold better with LUE.  Transfers                    Ambulation/Gait                Stairs            Wheelchair Mobility    Modified Rankin (Stroke Patients Only)       Balance                                             Pertinent Vitals/Pain Pain Assessment: Faces Faces Pain Scale: Hurts little more Pain Location: abdomen Pain Descriptors / Indicators: Sore Pain Intervention(s): Limited activity within patient's tolerance;Monitored during session;Repositioned    Home Living Family/patient expects to be discharged to:: Private  residence Living Arrangements: Spouse/significant other;Children Available Help at Discharge: Family;Available 24 hours/day Type of Home: House Home Access: Ramped entrance     Home Layout: One level Home Equipment: Wheelchair - power;Hospital bed Additional Comments: hoyer lift    Prior Function Level of Independence: Needs assistance   Gait / Transfers Assistance Needed: hoyer lift for OOB           Hand Dominance   Dominant Hand: Right    Extremity/Trunk Assessment   Upper Extremity Assessment: RUE deficits/detail;LUE deficits/detail RUE Deficits / Details: les than 3/5     LUE Deficits / Details: grossly 3/5   Lower Extremity Assessment: RLE deficits/detail;LLE deficits/detail RLE Deficits / Details: AKA, hip less than 3/5 LLE Deficits / Details: AKA, hip grossly 3/5     Communication   Communication: No difficulties  Cognition Arousal/Alertness: Awake/alert Behavior During Therapy: WFL for tasks assessed/performed Overall Cognitive Status: No family/caregiver present to determine baseline cognitive functioning       Memory: Decreased short-term memory              General Comments      Exercises        Assessment/Plan    PT Assessment Patient needs continued PT services  PT Diagnosis Generalized weakness;Acute  pain   PT Problem List Decreased strength;Decreased activity tolerance;Decreased mobility;Decreased balance;Pain  PT Treatment Interventions DME instruction;Functional mobility training;Therapeutic activities;Therapeutic exercise;Patient/family education;Balance training   PT Goals (Current goals can be found in the Care Plan section) Acute Rehab PT Goals Patient Stated Goal: return home PT Goal Formulation: With patient Time For Goal Achievement: 09/05/14 Potential to Achieve Goals: Fair    Frequency Min 2X/week   Barriers to discharge        Co-evaluation               End of Session Equipment Utilized During  Treatment: Oxygen   Patient left: in bed;with call bell/phone within reach Nurse Communication: Mobility status;Need for lift equipment (Pt with limited OOB at home due to pain. Here would need the amputee lift pad to attempt.)         Time: 0850-0859 PT Time Calculation (min) (ACUTE ONLY): 9 min   Charges:   PT Evaluation $Initial PT Evaluation Tier I: 1 Procedure     PT G Codes:        Jamayia Croker 09-07-14, 9:19 AM  [facility] PT [phone]

## 2014-08-22 NOTE — Progress Notes (Signed)
Patient has been NPO since MN and blood thinners held for cholangiogram schedule for today.

## 2014-08-22 NOTE — Progress Notes (Signed)
Daily Rounding Note  08/22/2014, 3:15 PM  LOS: 4 days   SUBJECTIVE:       Tolerating clears, still with some pain in LUQ. Chole drain output: 140cc on 1/30, 25 cc on 1/31.   OBJECTIVE:         Vital signs in last 24 hours:    Temp:  [97.9 F (36.6 C)-98.3 F (36.8 C)] 98.3 F (36.8 C) (02/01 1246) Pulse Rate:  [80-99] 82 (02/01 1246) Resp:  [18-28] 23 (02/01 1246) BP: (95-136)/(60-77) 100/67 mmHg (02/01 1246) SpO2:  [22 %-100 %] 100 % (02/01 1246) Weight:  [159 lb 9.8 oz (72.4 kg)] 159 lb 9.8 oz (72.4 kg) (02/01 0419) Last BM Date: 08/21/14 Filed Weights   08/18/14 1227 08/18/14 1759 08/22/14 0419  Weight: 155 lb (70.308 kg) 146 lb 14.4 oz (66.633 kg) 159 lb 9.8 oz (72.4 kg)   General: comfortable, chronically ill looking   Heart: RRR Chest: clear bil.  No labored resps Abdomen: soft, tender without guard or rebound in LUQ.  Active BS.  Slightly distended.  Clear amber colored bile in drain bag.  Extremities: bil AKA Neuro/Psych:  Cooperative, appropriate. Fully alert.     Intake/Output from previous day: 01/31 0701 - 02/01 0700 In: 92 [P.O.:120; I.V.:480] Out: 2300 [Urine:2250; Drains:50]  Intake/Output this shift: Total I/O In: 30 [P.O.:25; Other:5] Out: 2290 [Urine:2250; Drains:40]  Lab Results:  Recent Labs  08/20/14 0305 08/21/14 0310 08/22/14 0605  WBC 10.2 8.6 8.0  HGB 11.4* 12.7* 10.8*  HCT 34.0* 38.3* 32.6*  PLT 188 211 223   BMET  Recent Labs  08/20/14 0305 08/21/14 0310 08/22/14 0605  NA 135 135 136  K 3.4* 4.7 4.0  CL 107 110 110  CO2 22 21 22   GLUCOSE 196* 132* 116*  BUN 18 22 18   CREATININE 0.97 1.06 0.88  CALCIUM 7.7* 7.8* 7.7*   LFT  Recent Labs  08/20/14 0305 08/21/14 0310 08/22/14 0605  PROT 5.6* 5.4* 5.7*  ALBUMIN 2.2* 2.0* 2.0*  AST 126* 41* 24  ALT 179* 96* 62*  ALKPHOS 174* 125* 110  BILITOT 3.7* 1.3* 1.4*   PT/INR No results for input(s): LABPROT,  INR in the last 72 hours. Hepatitis Panel No results for input(s): HEPBSAG, HCVAB, HEPAIGM, HEPBIGM in the last 72 hours.  Studies/Results: Dg Chest Port 1 View  08/21/2014   CLINICAL DATA:  74 year old male with shortness breath.  EXAM: PORTABLE CHEST - 1 VIEW  COMPARISON:  Chest x-ray 08/18/2014.  FINDINGS: Lung volumes are low. No consolidative airspace disease. No pleural effusions. No evidence of pulmonary edema. Heart size is mildly enlarged. Upper mediastinal contours are distorted by patient positioning, but are similar to prior examinations.  IMPRESSION: 1. No radiographic evidence of acute cardiopulmonary disease. 2. Mild cardiomegaly.   Electronically Signed   By: Vinnie Langton M.D.   On: 08/21/2014 15:56   Ir Cholangiogram Existing Tube  08/22/2014   CLINICAL DATA:  Calculus cholecystitis, status post percutaneous transhepatic cholecystostomy abdomen  EXAM: CHOLANGIOGRAM VIA EXISTING CATHETER  COMPARISON:  08/19/2014, 08/18/2014  FINDINGS: Cholangiogram performed through the existing cholecystostomy. Numerous filling defects within the gallbladder compatible with cholelithiasis. Small filling defects demonstrated within the proximal cystic duct compatible with cystic duct stones. Contrast does not reach the common bile duct or duodenum.  IMPRESSION: Cholelithiasis with obstructing cystic duct stones.   Electronically Signed   By: Daryll Brod M.D.   On: 08/22/2014 12:12   Scheduled  Meds: . aspirin  81 mg Oral Daily  . baclofen  5 mg Oral BID  . feeding supplement (PRO-STAT SUGAR FREE 64)  30 mL Oral TID WC  . heparin  5,000 Units Subcutaneous 3 times per day  . insulin aspart  0-9 Units Subcutaneous 3 times per day  . pantoprazole (PROTONIX) IV  40 mg Intravenous Q24H  . piperacillin-tazobactam (ZOSYN)  IV  3.375 g Intravenous Q8H   Continuous Infusions: . sodium chloride 1,000 mL (08/21/14 1623)  . dextrose 5 % and 0.45% NaCl 50 mL/hr (08/22/14 1359)   PRN Meds:.acetaminophen,  acetaminophen-codeine, albuterol, alum & mag hydroxide-simeth, morphine injection, nitroGLYCERIN, [DISCONTINUED] ondansetron **OR** ondansetron (ZOFRAN) IV, sodium chloride   ASSESMENT:   *  Cholecystitis, s/p perc GB drain 08/19/14.  On zosyn. LFTs improving. Bile drainage slowing. Still with pain in LUQ Bile growing abundant VIRIDANS STREPTOCOCCUS   *  ? Choledocholithiasis.  Biliary tree normal on CT 1/28.  Obstructing cystic duct stones on cholangiogram.   *  Proteus UTI.    PLAN   *  Continue abx.     Jaime [name]  08/22/2014, 3:15 PM Pager: [phone]

## 2014-08-22 NOTE — Progress Notes (Signed)
Patient ID: Jaime [name], male   DOB: 12-08-1940, 74 y.o.   MRN: [id number]     [facility]      [address] [place], [place] [address]    Phone: [phone] FAX: [phone]     Subjective: Bilirubin remains elevated.  Abdominal pain.  No n/v.  Afebrile.  VSS,   Objective:  Vital signs:  Filed Vitals:   08/21/14 1938 08/22/14 0028 08/22/14 0126 08/22/14 0419  BP: 95/60 102/63 104/62 136/73  Pulse: 97 84  81  Temp: 97.9 F (36.6 C) 98.3 F (36.8 C)  98.2 F (36.8 C)  TempSrc: Oral Oral  Oral  Resp: $Remo'22 18  23  'zJPFx$ Height:      Weight:    159 lb 9.8 oz (72.4 kg)  SpO2: 99% 97%  22%    Last BM Date: 08/21/14  Intake/Output   Yesterday:  01/31 0701 - 02/01 0700 In: 659 [P.O.:120; I.V.:480] Out: 2275 [Urine:2250; Drains:25] This shift:    I/O last 3 completed shifts: In: 2185 [P.O.:120; I.V.:1580; Other:35; IV Piggyback:450] Out: 2760 [Urine:2675; Drains:85]    Physical Exam: General: Pt awake/alert/oriented x4 in no acute distress  Abdomen: Soft.  Nondistended. Generalized tenderness.  RUQ drain with bilious output.   No evidence of peritonitis.  No incarcerated hernias.    Problem List:   Principal Problem:   Sepsis Active Problems:   Multiple sclerosis   Pulmonary hypertension   S/P AKA (above knee amputation) bilateral   Mild cognitive impairment   Chronic systolic CHF (congestive heart failure)   Acute cholecystitis   UTI (lower urinary tract infection)   IDDM (insulin dependent diabetes mellitus)   She is a seborrheic EKG didn't show much he says he's off a UA she has easy she's come in for chest pain rule out   RUQ abdominal pain   SOB (shortness of breath)    Results:   Labs: Results for orders placed or performed during the hospital encounter of 08/18/14 (from the past 48 hour(s))  Glucose, capillary     Status: Abnormal   Collection Time: 08/20/14  8:01 AM  Result Value Ref Range    Glucose-Capillary 153 (H) 70 - 99 mg/dL  Glucose, capillary     Status: Abnormal   Collection Time: 08/20/14 12:39 PM  Result Value Ref Range   Glucose-Capillary 179 (H) 70 - 99 mg/dL  Glucose, capillary     Status: Abnormal   Collection Time: 08/20/14  3:49 PM  Result Value Ref Range   Glucose-Capillary 175 (H) 70 - 99 mg/dL  CBC     Status: Abnormal   Collection Time: 08/21/14  3:10 AM  Result Value Ref Range   WBC 8.6 4.0 - 10.5 K/uL   RBC 4.83 4.22 - 5.81 MIL/uL   Hemoglobin 12.7 (L) 13.0 - 17.0 g/dL   HCT 38.3 (L) 39.0 - 52.0 %   MCV 79.3 78.0 - 100.0 fL   MCH 26.3 26.0 - 34.0 pg   MCHC 33.2 30.0 - 36.0 g/dL   RDW 17.1 (H) 11.5 - 15.5 %   Platelets 211 150 - 400 K/uL  Comprehensive metabolic panel     Status: Abnormal   Collection Time: 08/21/14  3:10 AM  Result Value Ref Range   Sodium 135 135 - 145 mmol/L   Potassium 4.7 3.5 - 5.1 mmol/L    Comment: DELTA CHECK NOTED   Chloride 110 96 - 112 mmol/L   CO2 21 19 -  32 mmol/L   Glucose, Bld 132 (H) 70 - 99 mg/dL   BUN 22 6 - 23 mg/dL   Creatinine, Ser 1.06 0.50 - 1.35 mg/dL   Calcium 7.8 (L) 8.4 - 10.5 mg/dL   Total Protein 5.4 (L) 6.0 - 8.3 g/dL   Albumin 2.0 (L) 3.5 - 5.2 g/dL   AST 41 (H) 0 - 37 U/L   ALT 96 (H) 0 - 53 U/L   Alkaline Phosphatase 125 (H) 39 - 117 U/L   Total Bilirubin 1.3 (H) 0.3 - 1.2 mg/dL   GFR calc non Af Amer 68 (L) >90 mL/min   GFR calc Af Amer 79 (L) >90 mL/min    Comment: (NOTE) The eGFR has been calculated using the CKD EPI equation. This calculation has not been validated in all clinical situations. eGFR's persistently <90 mL/min signify possible Chronic Kidney Disease.    Anion gap 4 (L) 5 - 15  Glucose, capillary     Status: Abnormal   Collection Time: 08/21/14  7:44 AM  Result Value Ref Range   Glucose-Capillary 63 (L) 70 - 99 mg/dL  Glucose, capillary     Status: Abnormal   Collection Time: 08/21/14  8:08 AM  Result Value Ref Range   Glucose-Capillary 69 (L) 70 - 99 mg/dL   Glucose, capillary     Status: None   Collection Time: 08/21/14  8:33 AM  Result Value Ref Range   Glucose-Capillary 97 70 - 99 mg/dL  Clostridium Difficile by PCR     Status: None   Collection Time: 08/21/14 10:55 AM  Result Value Ref Range   C difficile by pcr NEGATIVE NEGATIVE  Glucose, capillary     Status: Abnormal   Collection Time: 08/21/14 12:26 PM  Result Value Ref Range   Glucose-Capillary 142 (H) 70 - 99 mg/dL  Glucose, capillary     Status: Abnormal   Collection Time: 08/21/14  3:05 PM  Result Value Ref Range   Glucose-Capillary 199 (H) 70 - 99 mg/dL  Glucose, capillary     Status: Abnormal   Collection Time: 08/21/14  4:20 PM  Result Value Ref Range   Glucose-Capillary 166 (H) 70 - 99 mg/dL  Blood gas, arterial     Status: Abnormal   Collection Time: 08/21/14  4:46 PM  Result Value Ref Range   FIO2 0.21 %   pH, Arterial 7.384 7.350 - 7.450   pCO2 arterial 29.0 (L) 35.0 - 45.0 mmHg   pO2, Arterial 73.7 (L) 80.0 - 100.0 mmHg   Bicarbonate 16.9 (L) 20.0 - 24.0 mEq/L   TCO2 17.8 0 - 100 mmol/L   Acid-base deficit 7.2 (H) 0.0 - 2.0 mmol/L   O2 Saturation 95.4 %   Patient temperature 98.6    Collection site LEFT RADIAL    Drawn by [id number]    Sample type ARTERIAL    Allens test (pass/fail) PASS PASS  Troponin I (q 6hr x 3)     Status: None   Collection Time: 08/21/14  6:10 PM  Result Value Ref Range   Troponin I 0.03 <0.031 ng/mL    Comment:        NO INDICATION OF MYOCARDIAL INJURY.   Troponin I (q 6hr x 3)     Status: None   Collection Time: 08/21/14 10:00 PM  Result Value Ref Range   Troponin I 0.03 <0.031 ng/mL    Comment:        NO INDICATION OF MYOCARDIAL INJURY.   CBC  Status: Abnormal   Collection Time: 08/22/14  6:05 AM  Result Value Ref Range   WBC 8.0 4.0 - 10.5 K/uL   RBC 4.14 (L) 4.22 - 5.81 MIL/uL   Hemoglobin 10.8 (L) 13.0 - 17.0 g/dL   HCT 32.6 (L) 39.0 - 52.0 %   MCV 78.7 78.0 - 100.0 fL   MCH 26.1 26.0 - 34.0 pg   MCHC 33.1 30.0  - 36.0 g/dL   RDW 16.8 (H) 11.5 - 15.5 %   Platelets 223 150 - 400 K/uL  Comprehensive metabolic panel     Status: Abnormal   Collection Time: 08/22/14  6:05 AM  Result Value Ref Range   Sodium 136 135 - 145 mmol/L   Potassium 4.0 3.5 - 5.1 mmol/L   Chloride 110 96 - 112 mmol/L   CO2 22 19 - 32 mmol/L   Glucose, Bld 116 (H) 70 - 99 mg/dL   BUN 18 6 - 23 mg/dL   Creatinine, Ser 0.88 0.50 - 1.35 mg/dL   Calcium 7.7 (L) 8.4 - 10.5 mg/dL   Total Protein 5.7 (L) 6.0 - 8.3 g/dL   Albumin 2.0 (L) 3.5 - 5.2 g/dL   AST 24 0 - 37 U/L   ALT 62 (H) 0 - 53 U/L   Alkaline Phosphatase 110 39 - 117 U/L   Total Bilirubin 1.4 (H) 0.3 - 1.2 mg/dL   GFR calc non Af Amer 84 (L) >90 mL/min   GFR calc Af Amer >90 >90 mL/min    Comment: (NOTE) The eGFR has been calculated using the CKD EPI equation. This calculation has not been validated in all clinical situations. eGFR's persistently <90 mL/min signify possible Chronic Kidney Disease.    Anion gap 4 (L) 5 - 15  Troponin I (q 6hr x 3)     Status: None   Collection Time: 08/22/14  6:05 AM  Result Value Ref Range   Troponin I 0.03 <0.031 ng/mL    Comment:        NO INDICATION OF MYOCARDIAL INJURY.     Imaging / Studies: Dg Chest Port 1 View  08/21/2014   CLINICAL DATA:  74 year old male with shortness breath.  EXAM: PORTABLE CHEST - 1 VIEW  COMPARISON:  Chest x-ray 08/18/2014.  FINDINGS: Lung volumes are low. No consolidative airspace disease. No pleural effusions. No evidence of pulmonary edema. Heart size is mildly enlarged. Upper mediastinal contours are distorted by patient positioning, but are similar to prior examinations.  IMPRESSION: 1. No radiographic evidence of acute cardiopulmonary disease. 2. Mild cardiomegaly.   Electronically Signed   By: Vinnie Langton M.D.   On: 08/21/2014 15:56    Medications / Allergies:  Scheduled Meds: . aspirin  81 mg Oral Daily  . baclofen  5 mg Oral BID  . feeding supplement (PRO-STAT SUGAR FREE 64)   30 mL Oral TID WC  . furosemide  20 mg Intravenous Q6H  . heparin  5,000 Units Subcutaneous 3 times per day  . insulin aspart  0-9 Units Subcutaneous 3 times per day  . pantoprazole (PROTONIX) IV  40 mg Intravenous Q24H  . piperacillin-tazobactam (ZOSYN)  IV  3.375 g Intravenous Q8H   Continuous Infusions: . sodium chloride 1,000 mL (08/21/14 1623)   PRN Meds:.acetaminophen, acetaminophen-codeine, albuterol, alum & mag hydroxide-simeth, morphine injection, nitroGLYCERIN, [DISCONTINUED] ondansetron **OR** ondansetron (ZOFRAN) IV, sodium chloride  Antibiotics: Anti-infectives    Start     Dose/Rate Route Frequency Ordered Stop   08/19/14 1515  levofloxacin (LEVAQUIN)  IVPB 500 mg  Status:  Discontinued     500 mg100 mL/hr over 60 Minutes Intravenous Every 24 hours 08/19/14 1514 08/20/14 1616   08/19/14 1500  vancomycin (VANCOCIN) IVPB 750 mg/150 ml premix  Status:  Discontinued     750 mg150 mL/hr over 60 Minutes Intravenous Every 12 hours 08/19/14 1450 08/21/14 0807   08/19/14 0300  vancomycin (VANCOCIN) IVPB 750 mg/150 ml premix  Status:  Discontinued     750 mg150 mL/hr over 60 Minutes Intravenous Every 12 hours 08/18/14 1359 08/19/14 1122   08/18/14 2000  piperacillin-tazobactam (ZOSYN) IVPB 3.375 g     3.375 g12.5 mL/hr over 240 Minutes Intravenous Every 8 hours 08/18/14 1359     08/18/14 1315  piperacillin-tazobactam (ZOSYN) IVPB 3.375 g     3.375 g100 mL/hr over 30 Minutes Intravenous  Once 08/18/14 1300 08/18/14 1425   08/18/14 1315  vancomycin (VANCOCIN) IVPB 1000 mg/200 mL premix     1,000 mg200 mL/hr over 60 Minutes Intravenous  Once 08/18/14 1300 08/18/14 1527        Assessment/Plan Cholecystitis transaminitits  -s/p cholecystostomy tube -cholangiogram today -zosyn 1/28--->  Erby Pian, [facility] Surgery Pager [phone](7A-4:30P) For consults and floor pages call [phone](7A-4:30P)  08/22/2014 7:59 AM

## 2014-08-22 NOTE — Progress Notes (Signed)
Dr Zenovia Jarred spoke with pt and family regarding IR this am. No ERCP for now. Pt may resume Clear liquids for now .

## 2014-08-22 NOTE — Procedures (Signed)
Cholangiogram thru existing cholecystostomy  Cholelithiasis with stones in cystic duct resulting in obstruction

## 2014-08-22 NOTE — Progress Notes (Signed)
Patient had an uneventful night. No loose stool s/p flexiseal removal.  Also patient noted with false asystole on the monitor at the desk, however, the monitor in the room and the patient was in NSR.

## 2014-08-22 NOTE — Progress Notes (Signed)
Patient Demographics  Jaime [name], is a 74 y.o. male, DOB - 1940/11/10, JGO:[id number]  Admit date - 08/18/2014   Admitting Physician No admitting provider for patient encounter.  Outpatient Primary MD for the patient is [name], MD  LOS - 4   Chief Complaint  Patient presents with  . Abdominal Pain      Brief summary  74 year old African-American gentleman with history of bilateral AKA, severe multiple sclerosis, chronic systolic heart failure, was admitted to the hospital with acute cholecystitis with sepsis, he was seen in my general surgery and GI, he underwent percutaneous gallbladder drain placement along with empiric IV antibiotics, he required IV fluids for sepsis and hypotension, eventually went into acute on chronic heart failure due to third spacing of fluid and fluid overload, seen by paramedic radical care and cardiology as well, improved with gentle Lasix, blood pressure stable for now, he's supposed to undergo cholangiogram on 08/22/2014 to make sure CBD is clean.   His percutaneous drain likely will stay in for 6-8 weeks. We'll request general surgery to address course of antibiotics, duration, management of percutaneous gallbladder drain.  Subjective:   Elick Aguilera today has, No headache, No chest pain, Improved RUQ abdominal pain - No Nausea, No new weakness tingling or numbness, No Cough - SOB.    Assessment & Plan    1. Sepsis with lactic acidosis due to acute cholecystitis. Gen. surgery and GI following, post percutaneous gallbladder drain placement by IR on 08/19/2014 as he is deemed to be a poor operative candidate by surgery. Empiric IV Zosyn continue & follow cultures. Liver enzymes trending down. Continue supportive care with IV fluids, going for cholangiogram on  08/22/2014.  His percutaneous drain likely will stay in for 6-8 weeks. We'll request general surgery to address course of antibiotics, duration, management of percutaneous gallbladder drain.   2. Hypotension. Due to #1 above. Blood pressure is better holding IV fluids now.   3. DM type II. Sugars borderline. Hold long-acting insulin every 8 hrs low dose sliding scale and monitor.  CBG (last 3)   Recent Labs  08/21/14 1226 08/21/14 1505 08/21/14 1620  GLUCAP 142* 199* 166*     Lab Results  Component Value Date   HGBA1C 8.9* 08/19/2014     4. UTI. Zosyn should suffice. Follow cultures.    5. Acute on Chronic systolic heart failure. EF now 40% improved from 35%  -  secondary to fluid overload and third spacing. IV fluids held, added protein supplement, gentle Lasix as tolerated by blood pressure.    6. Elevated troponin with nonspecific EKG changes. Cardiology was called thought this is secondary to sepsis and did not want to provide formal consultation. Troponin trend appears to be non-ACS pattern and seems to be due to demand ischemia from sepsis. Placed on 81 mg aspirin, no beta blocker due to sepsis and hypotension. Echogram noted with improved EF of 40% continues to have diffuse hypokinesis similar to recent echo in 2015. No chest pain. Appreciate cardiology input. Patient cardiology follow-up once septic issues resolved.     7. 1 out of 2 sets of blood culture positive for gram-positive cocci in clusters. Now Caog -ve. contamination. Stopped Vancomycin.    8. 3rd spacing of  fluids -  increase PO intake added Prostat , discontinue IV fluids,  Lasix gently.    9. H/O MS, Bilat AKA - supportive Rx.       Code Status: Full,  prognosis guarded.  Family Communication: none present  Disposition Plan: PT evaluation, social worker consulted   Procedures   TTE  Left ventricle: The cavity size was normal. Systolic function was moderately reduced. The estimated  ejection fraction was in the range of 35% to 40%. Diffuse hypokinesis. The study was not technically sufficient to allow evaluation of LV diastolicdysfunction due to atrial fibrillation. - Aortic valve: Trileaflet; mildly thickened, mildly calcifiedleaflets. There was no regurgitation. - Aortic root: The aortic root was normal in size. - Ascending aorta: The ascending aorta was normal in size. - Mitral valve: Structurally normal valve. There was noregurgitation. - Left atrium: The atrium was normal in size. - Right atrium: The atrium was normal in size. - Tricuspid valve: There was no regurgitation. - Pulmonic valve: There was no regurgitation. - Pulmonary arteries: Systolic pressure was within the normalrange. - Inferior vena cava: The vessel was normal in size. - Pericardium, extracardiac: There was no pericardial effusion.   CT scan abdomen and pelvis. Right upper quadrant ultrasound.   Perc. Kennyth Arnold.bladder Drain placement by IR on 08/19/2014  Cholangiogram scheduled for 08/22/2014    Consults  CCS, GI , IR, PCCM, Cards   Medications  Scheduled Meds: . aspirin  81 mg Oral Daily  . baclofen  5 mg Oral BID  . feeding supplement (PRO-STAT SUGAR FREE 64)  30 mL Oral TID WC  . furosemide  20 mg Intravenous Q6H  . heparin  5,000 Units Subcutaneous 3 times per day  . insulin aspart  0-9 Units Subcutaneous 3 times per day  . pantoprazole (PROTONIX) IV  40 mg Intravenous Q24H  . piperacillin-tazobactam (ZOSYN)  IV  3.375 g Intravenous Q8H   Continuous Infusions: . sodium chloride 1,000 mL (08/21/14 1623)   PRN Meds:.acetaminophen, acetaminophen-codeine, albuterol, alum & mag hydroxide-simeth, morphine injection, nitroGLYCERIN, [DISCONTINUED] ondansetron **OR** ondansetron (ZOFRAN) IV, sodium chloride  DVT Prophylaxis    Heparin    Lab Results  Component Value Date   PLT 223 08/22/2014    Antibiotics     Anti-infectives    Start     Dose/Rate Route Frequency Ordered  Stop   08/19/14 1515  levofloxacin (LEVAQUIN) IVPB 500 mg  Status:  Discontinued     500 mg100 mL/hr over 60 Minutes Intravenous Every 24 hours 08/19/14 1514 08/20/14 1616   08/19/14 1500  vancomycin (VANCOCIN) IVPB 750 mg/150 ml premix  Status:  Discontinued     750 mg150 mL/hr over 60 Minutes Intravenous Every 12 hours 08/19/14 1450 08/21/14 0807   08/19/14 0300  vancomycin (VANCOCIN) IVPB 750 mg/150 ml premix  Status:  Discontinued     750 mg150 mL/hr over 60 Minutes Intravenous Every 12 hours 08/18/14 1359 08/19/14 1122   08/18/14 2000  piperacillin-tazobactam (ZOSYN) IVPB 3.375 g     3.375 g12.5 mL/hr over 240 Minutes Intravenous Every 8 hours 08/18/14 1359     08/18/14 1315  piperacillin-tazobactam (ZOSYN) IVPB 3.375 g     3.375 g100 mL/hr over 30 Minutes Intravenous  Once 08/18/14 1300 08/18/14 1425   08/18/14 1315  vancomycin (VANCOCIN) IVPB 1000 mg/200 mL premix     1,000 mg200 mL/hr over 60 Minutes Intravenous  Once 08/18/14 1300 08/18/14 1527          Objective:   Filed Vitals:  08/22/14 0028 08/22/14 0126 08/22/14 0419 08/22/14 0821  BP: 102/63 104/62 136/73 123/71  Pulse: 84  81 80  Temp: 98.3 F (36.8 C)  98.2 F (36.8 C) 98.2 F (36.8 C)  TempSrc: Oral  Oral Oral  Resp: 18  23 18   Height:      Weight:   72.4 kg (159 lb 9.8 oz)   SpO2: 97%  22% 99%    Wt Readings from Last 3 Encounters:  08/22/14 72.4 kg (159 lb 9.8 oz)  08/19/13 67.132 kg (148 lb)  08/13/13 67.132 kg (148 lb)     Intake/Output Summary (Last 24 hours) at 08/22/14 0933 Last data filed at 08/22/14 0819  Gross per 24 hour  Intake    560 ml  Output   2725 ml  Net  -2165 ml     Physical Exam  Awake Alert, Oriented X 3, No new F.N deficits, Normal affect .AT,PERRAL Supple Neck,No JVD, No cervical lymphadenopathy appriciated.  Symmetrical Chest wall movement, Good air movement bilaterally, few rales RRR,No Gallops,Rubs or new Murmurs, No Parasternal Heave +ve B.Sounds, Abd Soft,  RUQ tenderness has GB drain in place, No organomegaly appriciated, No rebound - guarding or rigidity. No Cyanosis, Clubbing , trace edema, No new Rash or bruise , bilateral BKA   Data Review   Micro Results Recent Results (from the past 240 hour(s))  Blood Culture (routine x 2)     Status: None (Preliminary result)   Collection Time: 08/18/14  1:22 PM  Result Value Ref Range Status   Specimen Description BLOOD LEFT ANTECUBITAL  Final   Special Requests BOTTLES DRAWN AEROBIC AND ANAEROBIC 10CC  Final   Culture   Final           BLOOD CULTURE RECEIVED NO GROWTH TO DATE CULTURE WILL BE HELD FOR 5 DAYS BEFORE ISSUING A FINAL NEGATIVE REPORT Performed at [facility]    Report Status PENDING  Incomplete  Blood Culture (routine x 2)     Status: None   Collection Time: 08/18/14  1:27 PM  Result Value Ref Range Status   Specimen Description BLOOD HAND LEFT  Final   Special Requests BOTTLES DRAWN AEROBIC ONLY 5CC  Final   Culture   Final    STAPHYLOCOCCUS SPECIES (COAGULASE NEGATIVE) Note: THE SIGNIFICANCE OF ISOLATING THIS ORGANISM FROM A SINGLE SET OF BLOOD CULTURES WHEN MULTIPLE SETS ARE DRAWN IS UNCERTAIN. PLEASE NOTIFY THE MICROBIOLOGY DEPARTMENT WITHIN ONE WEEK IF SPECIATION AND SENSITIVITIES ARE REQUIRED. Note: Gram Stain Report Called to,Read Back By and Verified With: ALEX SOLOMON 08/19/14 1435 BY SMITHERSJ Performed at [facility]    Report Status 08/20/2014 FINAL  Final  Urine culture     Status: None   Collection Time: 08/18/14  1:53 PM  Result Value Ref Range Status   Specimen Description URINE, CATHETERIZED  Final   Special Requests NONE  Final   Colony Count   Final    >=100,000 COLONIES/ML Performed at [facility]    Culture   Final    PROTEUS MIRABILIS Performed at [facility]    Report Status 08/20/2014 FINAL  Final   Organism ID, Bacteria PROTEUS MIRABILIS  Final      Susceptibility   Proteus mirabilis - MIC*    AMPICILLIN <=2  SENSITIVE Sensitive     CEFAZOLIN <=4 SENSITIVE Sensitive     CEFTRIAXONE <=1 SENSITIVE Sensitive     CIPROFLOXACIN <=0.25 SENSITIVE Sensitive     GENTAMICIN <=1 SENSITIVE Sensitive  LEVOFLOXACIN <=0.12 SENSITIVE Sensitive     NITROFURANTOIN 128 RESISTANT Resistant     TOBRAMYCIN <=1 SENSITIVE Sensitive     TRIMETH/SULFA <=20 SENSITIVE Sensitive     PIP/TAZO <=4 SENSITIVE Sensitive     * PROTEUS MIRABILIS  MRSA PCR Screening     Status: None   Collection Time: 08/18/14  6:02 PM  Result Value Ref Range Status   MRSA by PCR NEGATIVE NEGATIVE Final    Comment:        The GeneXpert MRSA Assay (FDA approved for NASAL specimens only), is one component of a comprehensive MRSA colonization surveillance program. It is not intended to diagnose MRSA infection nor to guide or monitor treatment for MRSA infections.   Body fluid culture     Status: None   Collection Time: 08/19/14  4:09 PM  Result Value Ref Range Status   Specimen Description BILE  Final   Special Requests NONE  Final   Gram Stain   Final    NO WBC SEEN MODERATE GRAM POSITIVE COCCI IN CHAINS Performed at [facility]    Culture   Final    ABUNDANT VIRIDANS STREPTOCOCCUS Performed at [facility]    Report Status 08/22/2014 FINAL  Final   Organism ID, Bacteria VIRIDANS STREPTOCOCCUS  Final      Susceptibility   Viridans streptococcus - MIC (ETEST)*    PENICILLIN <=0.125 SENSITIVE Sensitive     * ABUNDANT VIRIDANS STREPTOCOCCUS  Clostridium Difficile by PCR     Status: None   Collection Time: 08/21/14 10:55 AM  Result Value Ref Range Status   C difficile by pcr NEGATIVE NEGATIVE Final    Radiology Reports Ct Abdomen Pelvis W Contrast  08/18/2014   CLINICAL DATA:  Periumbilical and right upper quadrant pain for 2 days. Elevated white blood cell count and elevated lactic acid.  EXAM: CT ABDOMEN AND PELVIS WITH CONTRAST  TECHNIQUE: Multidetector CT imaging of the abdomen and pelvis was performed  using the standard protocol following bolus administration of intravenous contrast.  CONTRAST:  100 mL OMNIPAQUE IOHEXOL 300 MG/ML  SOLN  COMPARISON:  CT abdomen and pelvis 10/09/2010.  FINDINGS: Mild dependent atelectasis is present in the lung bases. Heart size is enlarged. There is calcific coronary artery disease.  The gallbladder is markedly dilated with wall thickening and surrounding inflammatory change. The liver is diffusely low attenuating with a 1.4 cm cyst on image 15 which is unchanged in appearance. The biliary tree, adrenal glands, spleen and pancreas appear normal. Bilateral renal cysts are noted.  The patient has aortoiliac atherosclerosis. The right common iliac artery is dilated at 1.8 cm. The left common iliac artery measures 1.5 cm. There is a large volume of stool in the cecum. The colon is otherwise unremarkable. The stomach and small bowel appear normal. No lymphadenopathy is identified. Small fat containing umbilical hernia is noted.  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 No other focal bony lesion is identified.  IMPRESSION: Findings highly suspicious for acute cholecystitis. Right upper quadrant ultrasound could be used for further evaluation.  Fatty infiltration of the liver.  Calcific aortic and coronary atherosclerosis. 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  Small fat containing umbilical hernia.  Avascular necrosis of the femoral heads bilaterally.   Electronically Signed   By: Inge Rise M.D.   On: 08/18/2014 16:23   Dg Chest Port 1 View  08/21/2014  CLINICAL DATA:  74 year old male with shortness breath.  EXAM: PORTABLE CHEST - 1 VIEW  COMPARISON:  Chest x-ray 08/18/2014.  FINDINGS: Lung volumes are low. No consolidative airspace disease. No pleural effusions. No evidence of pulmonary edema. Heart size is mildly enlarged. Upper mediastinal contours are distorted by  patient positioning, but are similar to prior examinations.  IMPRESSION: 1. No radiographic evidence of acute cardiopulmonary disease. 2. Mild cardiomegaly.   Electronically Signed   By: Vinnie Langton M.D.   On: 08/21/2014 15:56   Dg Chest Portable 1 View  08/18/2014   CLINICAL DATA:  Initial encounter for abdominal pain.  Ex-smoker.  EXAM: PORTABLE CHEST - 1 VIEW  COMPARISON:  12/16/2013  FINDINGS: Numerous leads and wires project over the chest. Midline trachea. Moderate cardiomegaly. No pleural effusion or pneumothorax. mild subsegmental atelectasis at the left lung base. No gross free intraperitoneal air. Convex right thoracolumbar spine curvature.  IMPRESSION: Cardiomegaly, without acute disease.   Electronically Signed   By: Abigail Miyamoto M.D.   On: 08/18/2014 13:58   Ir Percutaneous Transhepatic Cholangiogram  08/19/2014   INDICATION: 74 year old male with a history of acute cholecystitis. He has been referred for evaluation for cholecystostomy placement.  EXAM: ULTRASOUND AND FLUOROSCOPIC-GUIDED CHOLECYSTOSTOMY TUBE PLACEMENT  COMPARISON:  None.  MEDICATIONS: Fentanyl 125 mcg IV; Versed 0 mg IV; The patient is currently admitted to the hospital and on intravenous antibiotics. 500 mg of Levaquin were administered periprocedure.  25 mg Demerol IV  ANESTHESIA/SEDATION: Total Moderate Sedation Time  Zero minutes  CONTRAST:  13mL OMNIPAQUE IOHEXOL 300 MG/ML  SOLN  FLUOROSCOPY TIME:  minutes  COMPLICATIONS: None immediate.  PROCEDURE: Informed written consent was obtained from the patient after a discussion of the risks, benefits and alternatives to treatment. Questions regarding the procedure were encouraged and answered. A timeout was performed prior to the initiation of the procedure.  The right upper abdominal quadrant was prepped and draped in the usual sterile fashion, and a sterile drape was applied covering the operative field. Maximum barrier sterile technique with sterile gowns and gloves were  used for the procedure. A timeout was performed prior to the initiation of the procedure. Local anesthesia was provided with 1% lidocaine with epinephrine.  Ultrasound scanning of the right upper quadrant demonstrates a markedly dilated gallbladder. Utilizing a transhepatic approach, a 22 gauge needle was advanced into the gallbladder under direct ultrasound guidance. An ultrasound image was saved for documentation purposes. Appropriate intraluminal puncture was confirmed with the efflux of bile and advancement of an 0.018 wire into the gallbladder lumen. The needle was exchanged for an [place] set. A small amount of contrast was injected to confirm appropriate intraluminal positioning. Over a Benson wire, a 80.2-French Cook cholecystomy tube was advanced into the gallbladder fossa, coiled and locked. Bile was aspirated and a small amount of contrast was injected as several post procedural spot radiographic images were obtained in various obliquities. The catheter was secured to the skin with suture, connected to a drainage bag and a dressing was placed.  The patient tolerated the procedure well without immediate post procedural complication.  IMPRESSION: Status post percutaneous cholecystostomy tube placement. A sample of fluid was sent to the lab for analysis.  Signed,  Dulcy Fanny. Earleen Newport, DO  [facility]  [facility]   Electronically Signed   By: Corrie Mckusick D.O.   On: 08/19/2014 16:18   US Abdomen Limited Ruq  08/19/2014   CLINICAL DATA:  Right upper quadrant abdominal pain.  EXAM: US ABDOMEN LIMITED - RIGHT UPPER QUADRANT  COMPARISON:  None.  FINDINGS: Gallbladder:  Gallbladder is moderately distended. Multiple stones and sludge are demonstrated in the gallbladder. Largest stone measures about 8 mm diameter. Focal area of mild gallbladder wall thickening up to 3.3 mm. Murphy's sign is negative.  Common bile duct:  Diameter: 6.3 mm, upper limits normal. Mild  intrahepatic bile duct dilatation is suggested. No filling defects are demonstrated although portions of the bowel duct are obscured by bowel gas.  Liver:  With diffusely increased coarsened liver echotexture likely representing fatty infiltration. No focal lesion definitively identified.  IMPRESSION: Cholelithiasis and gallbladder sludge with mild gallbladder wall thickening and distention. Changes may indicate acute cholecystitis although Murphy's sign is negative. Mild intrahepatic bile duct dilatation of nonspecific etiology.   Electronically Signed   By: Lucienne Capers M.D.   On: 08/19/2014 06:08     CBC  Recent Labs Lab 08/18/14 1250 08/18/14 1340 08/19/14 0643 08/20/14 0305 08/21/14 0310 08/22/14 0605  WBC 19.9*  --  18.0* 10.2 8.6 8.0  HGB 16.1 18.7* 14.6 11.4* 12.7* 10.8*  HCT 47.4 55.0* 42.7 34.0* 38.3* 32.6*  PLT 256  --  205 188 211 223  MCV 79.0  --  77.5* 76.9* 79.3 78.7  MCH 26.8  --  26.5 25.8* 26.3 26.1  MCHC 34.0  --  34.2 33.5 33.2 33.1  RDW 16.7*  --  16.5* 16.6* 17.1* 16.8*  LYMPHSABS 1.2  --   --   --   --   --   MONOABS 1.4*  --   --   --   --   --   EOSABS 0.0  --   --   --   --   --   BASOSABS 0.0  --   --   --   --   --     Chemistries   Recent Labs Lab 08/18/14 1250 08/18/14 1340 08/19/14 0643 08/20/14 0305 08/21/14 0310 08/22/14 0605  NA 135 135 138 135 135 136  K 4.3 4.3 3.7 3.4* 4.7 4.0  CL 102 103 107 107 110 110  CO2 18*  --  22 22 21 22   GLUCOSE 311* 318* 72 196* 132* 116*  BUN 21 26* 17 18 22 18   CREATININE 1.25 1.10 0.76 0.97 1.06 0.88  CALCIUM 9.2  --  8.3* 7.7* 7.8* 7.7*  AST 34  --  459* 126* 41* 24  ALT 44  --  324* 179* 96* 62*  ALKPHOS 146*  --  219* 174* 125* 110  BILITOT 1.4*  --  3.1* 3.7* 1.3* 1.4*   ------------------------------------------------------------------------------------------------------------------ estimated creatinine clearance is 77.7 mL/min (by C-G formula based on Cr of  0.88). ------------------------------------------------------------------------------------------------------------------ No results for input(s): HGBA1C in the last 72 hours. ------------------------------------------------------------------------------------------------------------------ No results for input(s): CHOL, HDL, LDLCALC, TRIG, CHOLHDL, LDLDIRECT in the last 72 hours. ------------------------------------------------------------------------------------------------------------------ No results for input(s): TSH, T4TOTAL, T3FREE, THYROIDAB in the last 72 hours.  Invalid input(s): FREET3 ------------------------------------------------------------------------------------------------------------------ No results for input(s): VITAMINB12, FOLATE, FERRITIN, TIBC, IRON, RETICCTPCT in the last 72 hours.  Coagulation profile  Recent Labs Lab 08/19/14 0820  INR 1.45    No results for input(s): DDIMER in the last 72 hours.  Cardiac Enzymes  Recent Labs Lab 08/21/14 1810 08/21/14 2200 08/22/14 0605  TROPONINI 0.03 0.03 0.03   ------------------------------------------------------------------------------------------------------------------ Invalid input(s): POCBNP     Time Spent in minutes  35   Chet Greenley K M.D on 08/22/2014 at 9:33 AM  Between 7am  to 7pm - Pager - [phone]  After 7pm go to [email or web address] - [facility] Office  [phone]

## 2014-08-23 DIAGNOSIS — R0600 Dyspnea, unspecified: Secondary | ICD-10-CM | POA: Insufficient documentation

## 2014-08-23 DIAGNOSIS — I2510 Atherosclerotic heart disease of native coronary artery without angina pectoris: Secondary | ICD-10-CM

## 2014-08-23 DIAGNOSIS — K838 Other specified diseases of biliary tract: Secondary | ICD-10-CM

## 2014-08-23 DIAGNOSIS — K819 Cholecystitis, unspecified: Secondary | ICD-10-CM | POA: Insufficient documentation

## 2014-08-23 LAB — COMPREHENSIVE METABOLIC PANEL
ALT: 51 U/L (ref 0–53)
ANION GAP: 8 (ref 5–15)
AST: 26 U/L (ref 0–37)
Albumin: 2.2 g/dL — ABNORMAL LOW (ref 3.5–5.2)
Alkaline Phosphatase: 113 U/L (ref 39–117)
BILIRUBIN TOTAL: 1 mg/dL (ref 0.3–1.2)
BUN: 12 mg/dL (ref 6–23)
CALCIUM: 8 mg/dL — AB (ref 8.4–10.5)
CO2: 23 mmol/L (ref 19–32)
CREATININE: 0.79 mg/dL (ref 0.50–1.35)
Chloride: 105 mmol/L (ref 96–112)
GFR calc Af Amer: >90 mL/min (ref >90)
GFR calc non Af Amer: 88 mL/min — ABNORMAL LOW (ref >90)
Glucose, Bld: 124 mg/dL — ABNORMAL HIGH (ref 70–99)
POTASSIUM: 3.3 mmol/L — AB (ref 3.5–5.1)
SODIUM: 136 mmol/L (ref 135–145)
Total Protein: 5.9 g/dL — ABNORMAL LOW (ref 6.0–8.3)

## 2014-08-23 LAB — CBC
HCT: 32.1 % — ABNORMAL LOW (ref 39.0–52.0)
Hemoglobin: 10.8 g/dL — ABNORMAL LOW (ref 13.0–17.0)
MCH: 25.5 pg — ABNORMAL LOW (ref 26.0–34.0)
MCHC: 33.6 g/dL (ref 30.0–36.0)
MCV: 75.7 fL — ABNORMAL LOW (ref 78.0–100.0)
Platelets: 280 10*3/uL (ref 150–400)
RBC: 4.24 MIL/uL (ref 4.22–5.81)
RDW: 16.5 % — ABNORMAL HIGH (ref 11.5–15.5)
WBC: 7.5 10*3/uL (ref 4.0–10.5)

## 2014-08-23 LAB — GLUCOSE, CAPILLARY
GLUCOSE-CAPILLARY: 224 mg/dL — AB (ref 70–99)
GLUCOSE-CAPILLARY: 232 mg/dL — AB (ref 70–99)
Glucose-Capillary: 174 mg/dL — ABNORMAL HIGH (ref 70–99)
Glucose-Capillary: 116 mg/dL — ABNORMAL HIGH (ref 70–99)
Glucose-Capillary: 147 mg/dL — ABNORMAL HIGH (ref 70–99)
Glucose-Capillary: 123 mg/dL — ABNORMAL HIGH (ref 70–99)
Glucose-Capillary: 142 mg/dL — ABNORMAL HIGH (ref 70–99)

## 2014-08-23 LAB — MAGNESIUM: MAGNESIUM: 1.6 mg/dL (ref 1.5–2.5)

## 2014-08-23 MED ORDER — INSULIN ASPART 100 UNIT/ML ~~LOC~~ SOLN
0.0000 [IU] | Freq: Three times a day (TID) | SUBCUTANEOUS | Status: DC
  Administered 2014-08-23: 1 [IU] via SUBCUTANEOUS
  Administered 2014-08-23: 3 [IU] via SUBCUTANEOUS
  Administered 2014-08-24 (×3): 2 [IU] via SUBCUTANEOUS
  Administered 2014-08-25 – 2014-08-26 (×3): 1 [IU] via SUBCUTANEOUS

## 2014-08-23 MED ORDER — FUROSEMIDE 10 MG/ML IJ SOLN: 20.0000 mg | Freq: Every day | INTRAMUSCULAR | Status: DC

## 2014-08-23 MED ORDER — POTASSIUM CHLORIDE CRYS ER 20 MEQ PO TBCR
40.0000 meq | EXTENDED_RELEASE_TABLET | ORAL | Status: AC
  Administered 2014-08-23 (×2): 40 meq via ORAL
  Filled 2014-08-23 (×2): qty 2

## 2014-08-23 MED ORDER — PANTOPRAZOLE SODIUM 40 MG PO TBEC
40.0000 mg | DELAYED_RELEASE_TABLET | Freq: Every day | ORAL | Status: DC
  Administered 2014-08-23 – 2014-08-26 (×3): 40 mg via ORAL
  Filled 2014-08-23 (×3): qty 1

## 2014-08-23 MED ORDER — INSULIN ASPART 100 UNIT/ML ~~LOC~~ SOLN: 0.0000 [IU] | Freq: Every day | SUBCUTANEOUS | Status: DC

## 2014-08-23 MED ORDER — MAGNESIUM SULFATE IN D5W 10-5 MG/ML-% IV SOLN
1.0000 g | Freq: Once | INTRAVENOUS | Status: AC
  Administered 2014-08-23: 1 g via INTRAVENOUS
  Filled 2014-08-23: qty 100

## 2014-08-23 MED ORDER — AMOXICILLIN-POT CLAVULANATE 875-125 MG PO TABS
1.0000 | ORAL_TABLET | Freq: Two times a day (BID) | ORAL | Status: DC
  Administered 2014-08-23 – 2014-08-26 (×7): 1 via ORAL
  Filled 2014-08-23 (×9): qty 1

## 2014-08-23 NOTE — Progress Notes (Signed)
Patient is transferring to 5W26 per hospital bed.  Patient is aware of this transfer.  Mrs. Salar Molden was called, message left on her answering machine.  Report given to Elberta Fortis, RN of 5W.

## 2014-08-23 NOTE — Progress Notes (Addendum)
Patient Demographics  Jaime [name], is a 74 y.o. male, DOB - [birth year], SNK:[id number]  Admit date - 08/18/2014   Admitting Physician No admitting provider for patient encounter.  Outpatient Primary MD for the patient is [name], MD  LOS - 5   Chief Complaint  Patient presents with  . Abdominal Pain      Brief summary  74 year old African-American gentleman with history of bilateral AKA, severe multiple sclerosis, chronic systolic heart failure, was admitted to the hospital with acute cholecystitis with sepsis, he was seen in my general surgery and GI, he underwent percutaneous gallbladder drain placement along with empiric IV antibiotics, he required IV fluids for sepsis and hypotension, eventually went into acute on chronic heart failure due to third spacing of fluid and fluid overload, seen by paramedic radical care and cardiology as well, improved with gentle Lasix, blood pressure stable for now, he's supposed to undergo cholangiogram on 08/22/2014 to make sure CBD is clean.   His percutaneous drain likely will stay in for 6-8 weeks. We'll request general surgery to address course of antibiotics, duration, management of percutaneous gallbladder drain.  Subjective:   Jaime [name] today has, No headache, No chest pain, Improved RUQ abdominal pain - No Nausea, No new weakness tingling or numbness, No Cough - SOB.    Assessment & Plan    1. Sepsis with lactic acidosis due to acute cholecystitis. Gen. surgery and GI following, post percutaneous gallbladder drain placement by IR on 08/19/2014 as he is deemed to be a poor operative candidate by surgery. Was on Empiric IV Zosyn from 08-18-14 to 08-23-2014, switched to Augmentin on 08/23/2014 based on cultures . Liver enzymes trending down. Continue  supportive care with IV fluids, cholangiogram on 08/22/2014 noted by GI for now no ERCP.  His percutaneous drain likely will stay in for 6-8 weeks. We'll request general surgery to address course of antibiotics, duration, management of percutaneous gallbladder drain.   2. Hypotension. Due to #1 above. Blood pressure is better holding IV fluids now.   3. DM type II. Sugars borderline. on low dose sliding scale and monitor.  CBG (last 3)   Recent Labs  08/23/14 0042 08/23/14 0431 08/23/14 0815  GLUCAP 174* 116* 147*     Lab Results  Component Value Date   HGBA1C 8.9* 08/19/2014     4. UTI. Treated with Zosyn.    5. Acute on Chronic systolic heart failure. EF now 40% improved from 35%  -  secondary to fluid overload and third spacing. IV fluids held, added protein supplement, resolved after Lasix. Initiate low-dose Coreg, low-dose Ace and low-dose oral Lasix if pressures stay stable today off fluids and Lasix.    6. Elevated troponin with nonspecific EKG changes. Cardiology was called thought this is secondary to sepsis and did not want to provide formal consultation. Troponin trend appears to be non-ACS pattern and seems to be due to demand ischemia from sepsis. Placed on 81 mg aspirin, Low dose B blocker if BP stays stable. Echogram noted with improved EF of 40% continues to have diffuse hypokinesis similar to recent echo in 2015. No chest pain. Appreciate cardiology input. OutPatient cardiology follow-up once septic issues resolved.     7. 1 out of 2  sets of blood culture positive for gram-positive cocci in clusters. Now Caog -ve. contamination. Stopped Vancomycin.    8. 3rd spacing of fluids -  increase PO intake added Prostat , discontinued IV fluids, resolved with Lasix.    9. H/O MS, Bilat AKA - supportive Rx.       Code Status: Full,  prognosis guarded.  Family Communication: none present  Disposition Plan: PT evaluation, social worker  consulted   Procedures   TTE  Left ventricle: The cavity size was normal. Systolic function was moderately reduced. The estimated ejection fraction was in the range of 35% to 40%. Diffuse hypokinesis. The study was not technically sufficient to allow evaluation of LV diastolicdysfunction due to atrial fibrillation. - Aortic valve: Trileaflet; mildly thickened, mildly calcifiedleaflets. There was no regurgitation. - Aortic root: The aortic root was normal in size. - Ascending aorta: The ascending aorta was normal in size. - Mitral valve: Structurally normal valve. There was noregurgitation. - Left atrium: The atrium was normal in size. - Right atrium: The atrium was normal in size. - Tricuspid valve: There was no regurgitation. - Pulmonic valve: There was no regurgitation. - Pulmonary arteries: Systolic pressure was within the normalrange. - Inferior vena cava: The vessel was normal in size. - Pericardium, extracardiac: There was no pericardial effusion.   CT scan abdomen and pelvis. Right upper quadrant ultrasound.   Perc. Kennyth Arnold.bladder Drain placement by IR on 08/19/2014  Cholangiogram scheduled for 08/22/2014    Consults  CCS, GI , IR, PCCM, Cards   Medications  Scheduled Meds: . amoxicillin-clavulanate  1 tablet Oral Q12H  . aspirin  81 mg Oral Daily  . baclofen  5 mg Oral BID  . feeding supplement (PRO-STAT SUGAR FREE 64)  30 mL Oral TID WC  . heparin  5,000 Units Subcutaneous 3 times per day  . insulin aspart  0-9 Units Subcutaneous 3 times per day  . pantoprazole  40 mg Oral Q0600   Continuous Infusions:   PRN Meds:.acetaminophen, acetaminophen-codeine, albuterol, alum & mag hydroxide-simeth, morphine injection, nitroGLYCERIN, [DISCONTINUED] ondansetron **OR** ondansetron (ZOFRAN) IV, sodium chloride  DVT Prophylaxis    Heparin    Lab Results  Component Value Date   PLT 280 08/23/2014    Antibiotics     Anti-infectives    Start     Dose/Rate Route  Frequency Ordered Stop   08/23/14 1000  amoxicillin-clavulanate (AUGMENTIN) 875-125 MG per tablet 1 tablet     1 tablet Oral Every 12 hours 08/23/14 0848     08/19/14 1515  levofloxacin (LEVAQUIN) IVPB 500 mg  Status:  Discontinued     500 mg100 mL/hr over 60 Minutes Intravenous Every 24 hours 08/19/14 1514 08/20/14 1616   08/19/14 1500  vancomycin (VANCOCIN) IVPB 750 mg/150 ml premix  Status:  Discontinued     750 mg150 mL/hr over 60 Minutes Intravenous Every 12 hours 08/19/14 1450 08/21/14 0807   08/19/14 0300  vancomycin (VANCOCIN) IVPB 750 mg/150 ml premix  Status:  Discontinued     750 mg150 mL/hr over 60 Minutes Intravenous Every 12 hours 08/18/14 1359 08/19/14 1122   08/18/14 2000  piperacillin-tazobactam (ZOSYN) IVPB 3.375 g  Status:  Discontinued     3.375 g12.5 mL/hr over 240 Minutes Intravenous Every 8 hours 08/18/14 1359 08/23/14 0848   08/18/14 1315  piperacillin-tazobactam (ZOSYN) IVPB 3.375 g     3.375 g100 mL/hr over 30 Minutes Intravenous  Once 08/18/14 1300 08/18/14 1425   08/18/14 1315  vancomycin (VANCOCIN) IVPB 1000 mg/200  mL premix     1,000 mg200 mL/hr over 60 Minutes Intravenous  Once 08/18/14 1300 08/18/14 1527          Objective:   Filed Vitals:   08/23/14 0400 08/23/14 0500 08/23/14 0600 08/23/14 0814  BP: 100/74  150/70 146/79  Pulse: 92 85 83 86  Temp: 98 F (36.7 C)   99.1 F (37.3 C)  TempSrc: Oral   Oral  Resp: 19 17 21 22   Height:      Weight:      SpO2: 97% 96% 96% 95%    Wt Readings from Last 3 Encounters:  08/22/14 72.4 kg (159 lb 9.8 oz)  08/19/13 67.132 kg (148 lb)  08/13/13 67.132 kg (148 lb)     Intake/Output Summary (Last 24 hours) at 08/23/14 0849 Last data filed at 08/23/14 0816  Gross per 24 hour  Intake   1140 ml  Output   4140 ml  Net  -3000 ml     Physical Exam  Awake Alert, Oriented X 3, No new F.N deficits, Normal affect [place].AT,PERRAL Supple Neck,No JVD, No cervical lymphadenopathy appriciated.  Symmetrical  Chest wall movement, Good air movement bilaterally, few rales RRR,No Gallops,Rubs or new Murmurs, No Parasternal Heave +ve B.Sounds, Abd Soft, minimal RUQ tenderness has GB drain in place, No organomegaly appriciated, No rebound - guarding or rigidity. No Cyanosis, Clubbing , trace edema, No new Rash or bruise , bilateral BKA   Data Review   Micro Results Recent Results (from the past 240 hour(s))  Blood Culture (routine x 2)     Status: None (Preliminary result)   Collection Time: 08/18/14  1:22 PM  Result Value Ref Range Status   Specimen Description BLOOD LEFT ANTECUBITAL  Final   Special Requests BOTTLES DRAWN AEROBIC AND ANAEROBIC 10CC  Final   Culture   Final           BLOOD CULTURE RECEIVED NO GROWTH TO DATE CULTURE WILL BE HELD FOR 5 DAYS BEFORE ISSUING A FINAL NEGATIVE REPORT Performed at [facility]    Report Status PENDING  Incomplete  Blood Culture (routine x 2)     Status: None   Collection Time: 08/18/14  1:27 PM  Result Value Ref Range Status   Specimen Description BLOOD HAND LEFT  Final   Special Requests BOTTLES DRAWN AEROBIC ONLY 5CC  Final   Culture   Final    STAPHYLOCOCCUS SPECIES (COAGULASE NEGATIVE) Note: THE SIGNIFICANCE OF ISOLATING THIS ORGANISM FROM A SINGLE SET OF BLOOD CULTURES WHEN MULTIPLE SETS ARE DRAWN IS UNCERTAIN. PLEASE NOTIFY THE MICROBIOLOGY DEPARTMENT WITHIN ONE WEEK IF SPECIATION AND SENSITIVITIES ARE REQUIRED. Note: Gram Stain Report Called to,Read Back By and Verified With: ALEX SOLOMON 08/19/14 1435 BY SMITHERSJ Performed at [facility]    Report Status 08/20/2014 FINAL  Final  Urine culture     Status: None   Collection Time: 08/18/14  1:53 PM  Result Value Ref Range Status   Specimen Description URINE, CATHETERIZED  Final   Special Requests NONE  Final   Colony Count   Final    >=100,000 COLONIES/ML Performed at [facility]    Culture   Final    PROTEUS MIRABILIS Performed at [facility]     Report Status 08/20/2014 FINAL  Final   Organism ID, Bacteria PROTEUS MIRABILIS  Final      Susceptibility   Proteus mirabilis - MIC*    AMPICILLIN <=2 SENSITIVE Sensitive     CEFAZOLIN <=  4 SENSITIVE Sensitive     CEFTRIAXONE <=1 SENSITIVE Sensitive     CIPROFLOXACIN <=0.25 SENSITIVE Sensitive     GENTAMICIN <=1 SENSITIVE Sensitive     LEVOFLOXACIN <=0.12 SENSITIVE Sensitive     NITROFURANTOIN 128 RESISTANT Resistant     TOBRAMYCIN <=1 SENSITIVE Sensitive     TRIMETH/SULFA <=20 SENSITIVE Sensitive     PIP/TAZO <=4 SENSITIVE Sensitive     * PROTEUS MIRABILIS  MRSA PCR Screening     Status: None   Collection Time: 08/18/14  6:02 PM  Result Value Ref Range Status   MRSA by PCR NEGATIVE NEGATIVE Final    Comment:        The GeneXpert MRSA Assay (FDA approved for NASAL specimens only), is one component of a comprehensive MRSA colonization surveillance program. It is not intended to diagnose MRSA infection nor to guide or monitor treatment for MRSA infections.   Body fluid culture     Status: None   Collection Time: 08/19/14  4:09 PM  Result Value Ref Range Status   Specimen Description BILE  Final   Special Requests NONE  Final   Gram Stain   Final    NO WBC SEEN MODERATE GRAM POSITIVE COCCI IN CHAINS Performed at [facility]    Culture   Final    ABUNDANT VIRIDANS STREPTOCOCCUS Performed at [facility]    Report Status 08/22/2014 FINAL  Final   Organism ID, Bacteria VIRIDANS STREPTOCOCCUS  Final      Susceptibility   Viridans streptococcus - MIC (ETEST)*    PENICILLIN <=0.125 SENSITIVE Sensitive     * ABUNDANT VIRIDANS STREPTOCOCCUS  Clostridium Difficile by PCR     Status: None   Collection Time: 08/21/14 10:55 AM  Result Value Ref Range Status   C difficile by pcr NEGATIVE NEGATIVE Final    Radiology Reports Ct Abdomen Pelvis W Contrast  08/18/2014   CLINICAL DATA:  Periumbilical and right upper quadrant pain for 2 days. Elevated white blood  cell count and elevated lactic acid.  EXAM: CT ABDOMEN AND PELVIS WITH CONTRAST  TECHNIQUE: Multidetector CT imaging of the abdomen and pelvis was performed using the standard protocol following bolus administration of intravenous contrast.  CONTRAST:  100 mL OMNIPAQUE IOHEXOL 300 MG/ML  SOLN  COMPARISON:  CT abdomen and pelvis 10/09/2010.  FINDINGS: Mild dependent atelectasis is present in the lung bases. Heart size is enlarged. There is calcific coronary artery disease.  The gallbladder is markedly dilated with wall thickening and surrounding inflammatory change. The liver is diffusely low attenuating with a 1.4 cm cyst on image 15 which is unchanged in appearance. The biliary tree, adrenal glands, spleen and pancreas appear normal. Bilateral renal cysts are noted.  The patient has aortoiliac atherosclerosis. The right common iliac artery is dilated at 1.8 cm. The left common iliac artery measures 1.5 cm. There is a large volume of stool in the cecum. The colon is otherwise unremarkable. The stomach and small bowel appear normal. No lymphadenopathy is identified. Small fat containing umbilical hernia is noted.  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 No other focal bony lesion is identified.  IMPRESSION: Findings highly suspicious for acute cholecystitis. Right upper quadrant ultrasound could be used for further evaluation.  Fatty infiltration of the liver.  Calcific aortic and coronary atherosclerosis. 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  Small fat containing umbilical hernia.  Avascular  necrosis of the femoral heads bilaterally.   Electronically Signed   By: Inge Rise M.D.   On: 08/18/2014 16:23   Ir Perc Cholecystostomy  08/19/2014   INDICATION: 74 year old male with a history of acute cholecystitis. He has been referred for evaluation for cholecystostomy placement.  EXAM: ULTRASOUND AND  FLUOROSCOPIC-GUIDED CHOLECYSTOSTOMY TUBE PLACEMENT  COMPARISON:  None.  MEDICATIONS: Fentanyl 125 mcg IV; Versed 0 mg IV; The patient is currently admitted to the hospital and on intravenous antibiotics. 500 mg of Levaquin were administered periprocedure.  25 mg Demerol IV  ANESTHESIA/SEDATION: Total Moderate Sedation Time  Zero minutes  CONTRAST:  74mL OMNIPAQUE IOHEXOL 300 MG/ML  SOLN  FLUOROSCOPY TIME:  minutes  COMPLICATIONS: None immediate.  PROCEDURE: Informed written consent was obtained from the patient after a discussion of the risks, benefits and alternatives to treatment. Questions regarding the procedure were encouraged and answered. A timeout was performed prior to the initiation of the procedure.  The right upper abdominal quadrant was prepped and draped in the usual sterile fashion, and a sterile drape was applied covering the operative field. Maximum barrier sterile technique with sterile gowns and gloves were used for the procedure. A timeout was performed prior to the initiation of the procedure. Local anesthesia was provided with 1% lidocaine with epinephrine.  Ultrasound scanning of the right upper quadrant demonstrates a markedly dilated gallbladder. Utilizing a transhepatic approach, a 22 gauge needle was advanced into the gallbladder under direct ultrasound guidance. An ultrasound image was saved for documentation purposes. Appropriate intraluminal puncture was confirmed with the efflux of bile and advancement of an 0.018 wire into the gallbladder lumen. The needle was exchanged for an [place] set. A small amount of contrast was injected to confirm appropriate intraluminal positioning. Over a Benson wire, a 70.2-French Cook cholecystomy tube was advanced into the gallbladder fossa, coiled and locked. Bile was aspirated and a small amount of contrast was injected as several post procedural spot radiographic images were obtained in various obliquities. The catheter was secured to the skin with  suture, connected to a drainage bag and a dressing was placed.  The patient tolerated the procedure well without immediate post procedural complication.  IMPRESSION: Status post percutaneous cholecystostomy tube placement. A sample of fluid was sent to the lab for analysis.  Signed,  Dulcy Fanny. Earleen Newport, DO  [facility]  [facility]   Electronically Signed   By: Corrie Mckusick D.O.   On: 08/19/2014 16:18   Dg Chest Port 1 View  08/21/2014   CLINICAL DATA:  74 year old male with shortness breath.  EXAM: PORTABLE CHEST - 1 VIEW  COMPARISON:  Chest x-ray 08/18/2014.  FINDINGS: Lung volumes are low. No consolidative airspace disease. No pleural effusions. No evidence of pulmonary edema. Heart size is mildly enlarged. Upper mediastinal contours are distorted by patient positioning, but are similar to prior examinations.  IMPRESSION: 1. No radiographic evidence of acute cardiopulmonary disease. 2. Mild cardiomegaly.   Electronically Signed   By: Vinnie Langton M.D.   On: 08/21/2014 15:56   Dg Chest Portable 1 View  08/18/2014   CLINICAL DATA:  Initial encounter for abdominal pain.  Ex-smoker.  EXAM: PORTABLE CHEST - 1 VIEW  COMPARISON:  12/16/2013  FINDINGS: Numerous leads and wires project over the chest. Midline trachea. Moderate cardiomegaly. No pleural effusion or pneumothorax. mild subsegmental atelectasis at the left lung base. No gross free intraperitoneal air. Convex right thoracolumbar spine curvature.  IMPRESSION: Cardiomegaly, without acute disease.  Electronically Signed   By: Abigail Miyamoto M.D.   On: 08/18/2014 13:58   Ir Cholangiogram Existing Tube  08/22/2014   CLINICAL DATA:  Calculus cholecystitis, status post percutaneous transhepatic cholecystostomy abdomen  EXAM: CHOLANGIOGRAM VIA EXISTING CATHETER  COMPARISON:  08/19/2014, 08/18/2014  FINDINGS: Cholangiogram performed through the existing cholecystostomy. Numerous filling defects within the  gallbladder compatible with cholelithiasis. Small filling defects demonstrated within the proximal cystic duct compatible with cystic duct stones. Contrast does not reach the common bile duct or duodenum.  IMPRESSION: Cholelithiasis with obstructing cystic duct stones.   Electronically Signed   By: Daryll Brod M.D.   On: 08/22/2014 12:12   US Abdomen Limited Ruq  08/19/2014   CLINICAL DATA:  Right upper quadrant abdominal pain.  EXAM: US ABDOMEN LIMITED - RIGHT UPPER QUADRANT  COMPARISON:  None.  FINDINGS: Gallbladder:  Gallbladder is moderately distended. Multiple stones and sludge are demonstrated in the gallbladder. Largest stone measures about 8 mm diameter. Focal area of mild gallbladder wall thickening up to 3.3 mm. Murphy's sign is negative.  Common bile duct:  Diameter: 6.3 mm, upper limits normal. Mild intrahepatic bile duct dilatation is suggested. No filling defects are demonstrated although portions of the bowel duct are obscured by bowel gas.  Liver:  With diffusely increased coarsened liver echotexture likely representing fatty infiltration. No focal lesion definitively identified.  IMPRESSION: Cholelithiasis and gallbladder sludge with mild gallbladder wall thickening and distention. Changes may indicate acute cholecystitis although Murphy's sign is negative. Mild intrahepatic bile duct dilatation of nonspecific etiology.   Electronically Signed   By: Lucienne Capers M.D.   On: 08/19/2014 06:08     CBC  Recent Labs Lab 08/18/14 1250  08/19/14 0643 08/20/14 0305 08/21/14 0310 08/22/14 0605 08/23/14 0724  WBC 19.9*  --  18.0* 10.2 8.6 8.0 7.5  HGB 16.1  < > 14.6 11.4* 12.7* 10.8* 10.8*  HCT 47.4  < > 42.7 34.0* 38.3* 32.6* 32.1*  PLT 256  --  205 188 211 223 280  MCV 79.0  --  77.5* 76.9* 79.3 78.7 75.7*  MCH 26.8  --  26.5 25.8* 26.3 26.1 25.5*  MCHC 34.0  --  34.2 33.5 33.2 33.1 33.6  RDW 16.7*  --  16.5* 16.6* 17.1* 16.8* 16.5*  LYMPHSABS 1.2  --   --   --   --   --   --    MONOABS 1.4*  --   --   --   --   --   --   EOSABS 0.0  --   --   --   --   --   --   BASOSABS 0.0  --   --   --   --   --   --   < > = values in this interval not displayed.  Chemistries   Recent Labs Lab 08/19/14 0643 08/20/14 0305 08/21/14 0310 08/22/14 0605 08/23/14 0724  NA 138 135 135 136 136  K 3.7 3.4* 4.7 4.0 3.3*  CL 107 107 110 110 105  CO2 22 22 21 22 23   GLUCOSE 72 196* 132* 116* 124*  BUN 17 18 22 18 12   CREATININE 0.76 0.97 1.06 0.88 0.79  CALCIUM 8.3* 7.7* 7.8* 7.7* 8.0*  MG  --   --   --   --  1.6  AST 459* 126* 41* 24 26  ALT 324* 179* 96* 62* 51  ALKPHOS 219* 174* 125* 110 113  BILITOT 3.1* 3.7* 1.3*  1.4* 1.0   ------------------------------------------------------------------------------------------------------------------ estimated creatinine clearance is 85.5 mL/min (by C-G formula based on Cr of 0.79). ------------------------------------------------------------------------------------------------------------------ No results for input(s): HGBA1C in the last 72 hours. ------------------------------------------------------------------------------------------------------------------ No results for input(s): CHOL, HDL, LDLCALC, TRIG, CHOLHDL, LDLDIRECT in the last 72 hours. ------------------------------------------------------------------------------------------------------------------ No results for input(s): TSH, T4TOTAL, T3FREE, THYROIDAB in the last 72 hours.  Invalid input(s): FREET3 ------------------------------------------------------------------------------------------------------------------ No results for input(s): VITAMINB12, FOLATE, FERRITIN, TIBC, IRON, RETICCTPCT in the last 72 hours.  Coagulation profile  Recent Labs Lab 08/19/14 0820  INR 1.45    No results for input(s): DDIMER in the last 72 hours.  Cardiac Enzymes  Recent Labs Lab 08/21/14 1810 08/21/14 2200 08/22/14 0605  TROPONINI 0.03 0.03 0.03    ------------------------------------------------------------------------------------------------------------------ Invalid input(s): POCBNP     Time Spent in minutes  35   Jerrit Horen K M.D on 08/23/2014 at 8:49 AM  Between 7am to 7pm - Pager - [phone]  After 7pm go to [email or web address] - [facility] Office  [phone]

## 2014-08-23 NOTE — Progress Notes (Signed)
Pt will need to follow up in the drain clinic with IR.  Ples Trudel, ANP-BC

## 2014-08-23 NOTE — Progress Notes (Addendum)
Patient: Jaime [name] / Admit Date: 08/18/2014 / Date of Encounter: 08/23/2014, 12:04 PM   Subjective: Feeling much better. Denies recent CP or SOB. Abd remains slightly sore.  Objective: Telemetry: N/A since being on 5w but on 2C briefly monitored with NSR Physical Exam: Blood pressure 132/81, pulse 93, temperature 99.1 F (37.3 C), temperature source Oral, resp. rate 21, height 5\' 11"  (1.803 m), weight 159 lb 9.8 oz (72.4 kg), SpO2 96 %. General: Well developed, well nourished M, in no acute distress. Laying flat in bed Head: Normocephalic, atraumatic, sclera non-icteric, no xanthomas, nares are without discharge. Neck: JVP not elevated. Lungs: Clear bilaterally to auscultation without wheezes, rales, or rhonchi. Breathing is unlabored. Heart: RRR S1 S2 without murmurs, rubs, or gallops.  Abdomen: Soft, slightly tender. Extremities: No clubbing or cyanosis. 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 Neuro: Alert and oriented X 3. Moves all extremities spontaneously. Psych:  Responds to questions appropriately with a normal affect.   Intake/Output Summary (Last 24 hours) at 08/23/14 1204 Last data filed at 08/23/14 0816  Gross per 24 hour  Intake   1110 ml  Output   3050 ml  Net  -1940 ml    Inpatient Medications:  . amoxicillin-clavulanate  1 tablet Oral Q12H  . aspirin  81 mg Oral Daily  . baclofen  5 mg Oral BID  . feeding supplement (PRO-STAT SUGAR FREE 64)  30 mL Oral TID WC  . heparin  5,000 Units Subcutaneous 3 times per day  . insulin aspart  0-5 Units Subcutaneous QHS  . insulin aspart  0-9 Units Subcutaneous TID WC  . pantoprazole  40 mg Oral Q0600  . potassium chloride  40 mEq Oral Q4H   Infusions:    Labs:  Recent Labs  08/22/14 0605 08/23/14 0724  NA 136 136  K 4.0 3.3*  CL 110 105  CO2 22 23  GLUCOSE 116* 124*  BUN 18 12  CREATININE 0.88 0.79  CALCIUM 7.7* 8.0*  MG  --  1.6    Recent Labs  08/22/14 0605 08/23/14 0724  AST 24 26    ALT 62* 51  ALKPHOS 110 113  BILITOT 1.4* 1.0  PROT 5.7* 5.9*  ALBUMIN 2.0* 2.2*    Recent Labs  08/22/14 0605 08/23/14 0724  WBC 8.0 7.5  HGB 10.8* 10.8*  HCT 32.6* 32.1*  MCV 78.7 75.7*  PLT 223 280    Recent Labs  08/21/14 1810 08/21/14 2200 08/22/14 0605  TROPONINI 0.03 0.03 0.03   Invalid input(s): POCBNP No results for input(s): HGBA1C in the last 72 hours.   Radiology/Studies:  Ct Abdomen Pelvis W Contrast  08/18/2014   CLINICAL DATA:  Periumbilical and right upper quadrant pain for 2 days. Elevated white blood cell count and elevated lactic acid.  EXAM: CT ABDOMEN AND PELVIS WITH CONTRAST  TECHNIQUE: Multidetector CT imaging of the abdomen and pelvis was performed using the standard protocol following bolus administration of intravenous contrast.  CONTRAST:  100 mL OMNIPAQUE IOHEXOL 300 MG/ML  SOLN  COMPARISON:  CT abdomen and pelvis 10/09/2010.  FINDINGS: Mild dependent atelectasis is present in the lung bases. Heart size is enlarged. There is calcific coronary artery disease.  The gallbladder is markedly dilated with wall thickening and surrounding inflammatory change. The liver is diffusely low attenuating with a 1.4 cm cyst on image 15 which is unchanged in appearance. The biliary tree, adrenal glands, spleen and pancreas appear normal. Bilateral renal cysts are noted.  The patient has aortoiliac  atherosclerosis. The right common iliac artery is dilated at 1.8 cm. The left common iliac artery measures 1.5 cm. There is a large volume of stool in the cecum. The colon is otherwise unremarkable. The stomach and small bowel appear normal. No lymphadenopathy is identified. Small fat containing umbilical hernia is noted.  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 No other focal bony lesion is identified.  IMPRESSION: Findings highly suspicious for acute cholecystitis. Right upper quadrant ultrasound could be used for further evaluation.  Fatty  infiltration of the liver.  Calcific aortic and coronary atherosclerosis. 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  Small fat containing umbilical hernia.  Avascular necrosis of the femoral heads bilaterally.   Electronically Signed   By: Inge Rise M.D.   On: 08/18/2014 16:23   Ir Perc Cholecystostomy  08/19/2014   INDICATION: 74 year old male with a history of acute cholecystitis. He has been referred for evaluation for cholecystostomy placement.  EXAM: ULTRASOUND AND FLUOROSCOPIC-GUIDED CHOLECYSTOSTOMY TUBE PLACEMENT  COMPARISON:  None.  MEDICATIONS: Fentanyl 125 mcg IV; Versed 0 mg IV; The patient is currently admitted to the hospital and on intravenous antibiotics. 500 mg of Levaquin were administered periprocedure.  25 mg Demerol IV  ANESTHESIA/SEDATION: Total Moderate Sedation Time  Zero minutes  CONTRAST:  73mL OMNIPAQUE IOHEXOL 300 MG/ML  SOLN  FLUOROSCOPY TIME:  minutes  COMPLICATIONS: None immediate.  PROCEDURE: Informed written consent was obtained from the patient after a discussion of the risks, benefits and alternatives to treatment. Questions regarding the procedure were encouraged and answered. A timeout was performed prior to the initiation of the procedure.  The right upper abdominal quadrant was prepped and draped in the usual sterile fashion, and a sterile drape was applied covering the operative field. Maximum barrier sterile technique with sterile gowns and gloves were used for the procedure. A timeout was performed prior to the initiation of the procedure. Local anesthesia was provided with 1% lidocaine with epinephrine.  Ultrasound scanning of the right upper quadrant demonstrates a markedly dilated gallbladder. Utilizing a transhepatic approach, a 22 gauge needle was advanced into the gallbladder under direct ultrasound guidance. An ultrasound image was saved for documentation purposes. Appropriate intraluminal  puncture was confirmed with the efflux of bile and advancement of an 0.018 wire into the gallbladder lumen. The needle was exchanged for an [place] set. A small amount of contrast was injected to confirm appropriate intraluminal positioning. Over a Benson wire, a 54.2-French Cook cholecystomy tube was advanced into the gallbladder fossa, coiled and locked. Bile was aspirated and a small amount of contrast was injected as several post procedural spot radiographic images were obtained in various obliquities. The catheter was secured to the skin with suture, connected to a drainage bag and a dressing was placed.  The patient tolerated the procedure well without immediate post procedural complication.  IMPRESSION: Status post percutaneous cholecystostomy tube placement. A sample of fluid was sent to the lab for analysis.  Signed,  Dulcy Fanny. Earleen Newport, DO  [facility]  [facility]   Electronically Signed   By: Corrie Mckusick D.O.   On: 08/19/2014 16:18   Dg Chest Port 1 View  08/21/2014   CLINICAL DATA:  74 year old male with shortness breath.  EXAM: PORTABLE CHEST - 1 VIEW  COMPARISON:  Chest x-ray 08/18/2014.  FINDINGS: Lung volumes are low. No consolidative airspace disease. No pleural effusions. No evidence  of pulmonary edema. Heart size is mildly enlarged. Upper mediastinal contours are distorted by patient positioning, but are similar to prior examinations.  IMPRESSION: 1. No radiographic evidence of acute cardiopulmonary disease. 2. Mild cardiomegaly.   Electronically Signed   By: Vinnie Langton M.D.   On: 08/21/2014 15:56   Dg Chest Portable 1 View  08/18/2014   CLINICAL DATA:  Initial encounter for abdominal pain.  Ex-smoker.  EXAM: PORTABLE CHEST - 1 VIEW  COMPARISON:  12/16/2013  FINDINGS: Numerous leads and wires project over the chest. Midline trachea. Moderate cardiomegaly. No pleural effusion or pneumothorax. mild subsegmental atelectasis at the left lung  base. No gross free intraperitoneal air. Convex right thoracolumbar spine curvature.  IMPRESSION: Cardiomegaly, without acute disease.   Electronically Signed   By: Abigail Miyamoto M.D.   On: 08/18/2014 13:58   Ir Cholangiogram Existing Tube  08/22/2014   CLINICAL DATA:  Calculus cholecystitis, status post percutaneous transhepatic cholecystostomy abdomen  EXAM: CHOLANGIOGRAM VIA EXISTING CATHETER  COMPARISON:  08/19/2014, 08/18/2014  FINDINGS: Cholangiogram performed through the existing cholecystostomy. Numerous filling defects within the gallbladder compatible with cholelithiasis. Small filling defects demonstrated within the proximal cystic duct compatible with cystic duct stones. Contrast does not reach the common bile duct or duodenum.  IMPRESSION: Cholelithiasis with obstructing cystic duct stones.   Electronically Signed   By: Daryll Brod M.D.   On: 08/22/2014 12:12   US Abdomen Limited Ruq  08/19/2014   CLINICAL DATA:  Right upper quadrant abdominal pain.  EXAM: US ABDOMEN LIMITED - RIGHT UPPER QUADRANT  COMPARISON:  None.  FINDINGS: Gallbladder:  Gallbladder is moderately distended. Multiple stones and sludge are demonstrated in the gallbladder. Largest stone measures about 8 mm diameter. Focal area of mild gallbladder wall thickening up to 3.3 mm. Murphy's sign is negative.  Common bile duct:  Diameter: 6.3 mm, upper limits normal. Mild intrahepatic bile duct dilatation is suggested. No filling defects are demonstrated although portions of the bowel duct are obscured by bowel gas.  Liver:  With diffusely increased coarsened liver echotexture likely representing fatty infiltration. No focal lesion definitively identified.  IMPRESSION: Cholelithiasis and gallbladder sludge with mild gallbladder wall thickening and distention. Changes may indicate acute cholecystitis although Murphy's sign is negative. Mild intrahepatic bile duct dilatation of nonspecific etiology.   Electronically Signed   By: Lucienne Capers M.D.   On: 08/19/2014 06:08     Assessment and Plan  1. Sepsis with lactic acidosis due to acute cholecystitis with transaminitis s/p percutaneous drain placement 2. Other medical issues including severe MS, prior AKAs, microcytic anemia, DM uncontrolled A1C 8.9 3. Ischemic cardiomyopathy with chronic systolic CHF 4. Mildly elevated troponin felt due to sepsis (peak 0.08) 5. H/o CAD s/p PCI 2007  Stable from cardiac standpoint. 2D Echo 08/19/14: EF 35-40%, diffuse HK, no significant change from 02/2014. The report did mention "The study was nottechnically sufficient to allow evaluation of LV diastolicdysfunction due to atrial fibrillation" but he has no history of AF and to my knowledge has not been in AF since admission per review of chart. I asked Dr. Stanford Breed to review who reports he was in NSR during this study, occasional PAC and PVC. Would recommend to gradually add back home cardiac regimen as you are able. Do not anticipate a need for Lasix at home.  I have left a message on our office's scheduling voicemail requesting a follow-up appointment in several weeks, and our office will call the patient with this information. (Next appt  with Ron Parker wasn't for a while.)  Signed, Dayna Dunn PA-C  I have examined the patient and reviewed assessment and plan and discussed with patient.  Agree with above as stated.  Stable from a cardiac standpoint at this time.  Will schedule f/u as above.   Ashleah Valtierra S.

## 2014-08-23 NOTE — Progress Notes (Signed)
Daily Rounding Note  08/23/2014, 8:16 AM  LOS: 5 days   SUBJECTIVE:       Tolerating clears.  Some discomfort in RUQ with coughing, twisting and other thoracic movements.  Otherwise doing well.   Bile output 90 cc 2/1.   OBJECTIVE:         Vital signs in last 24 hours:    Temp:  [97.8 F (36.6 C)-99.1 F (37.3 C)] 99.1 F (37.3 C) (02/02 0814) Pulse Rate:  [80-99] 86 (02/02 0814) Resp:  [16-25] 22 (02/02 0814) BP: (96-150)/(62-79) 146/79 mmHg (02/02 0814) SpO2:  [90 %-100 %] 95 % (02/02 0814) Last BM Date: 08/21/14 Filed Weights   08/18/14 1227 08/18/14 1759 08/22/14 0419  Weight: 155 lb (70.308 kg) 146 lb 14.4 oz (66.633 kg) 159 lb 9.8 oz (72.4 kg)   General: looks good, comfortable.  NAD   Heart: RRR Chest: clear bil.  No dyspnea Abdomen: soft, slight discomfort RUQ but this is not at area of chole tube insertion.  Cystostomy bag empty.   Extremities: s/p bil AKA.  Neuro/Psych:  Pleasant, oriented x 3, alert and engaged.    Intake/Output from previous day: 02/01 0701 - 02/02 0700 In: 1140 [P.O.:435; I.V.:700] Out: 3490 [Urine:3400; Drains:90]  Intake/Output this shift:    Lab Results:  Recent Labs  08/21/14 0310 08/22/14 0605 08/23/14 0724  WBC 8.6 8.0 7.5  HGB 12.7* 10.8* 10.8*  HCT 38.3* 32.6* 32.1*  PLT 211 223 280   BMET  Recent Labs  08/21/14 0310 08/22/14 0605  NA 135 136  K 4.7 4.0  CL 110 110  CO2 21 22  GLUCOSE 132* 116*  BUN 22 18  CREATININE 1.06 0.88  CALCIUM 7.8* 7.7*   LFT  Recent Labs  08/21/14 0310 08/22/14 0605  PROT 5.4* 5.7*  ALBUMIN 2.0* 2.0*  AST 41* 24  ALT 96* 62*  ALKPHOS 125* 110  BILITOT 1.3* 1.4*   PT/INR No results for input(s): LABPROT, INR in the last 72 hours. Hepatitis Panel No results for input(s): HEPBSAG, HCVAB, HEPAIGM, HEPBIGM in the last 72 hours.  Studies/Results: Dg Chest Port 1 View  08/21/2014   CLINICAL DATA:  74 year old male  with shortness breath.  EXAM: PORTABLE CHEST - 1 VIEW  COMPARISON:  Chest x-ray 08/18/2014.  FINDINGS: Lung volumes are low. No consolidative airspace disease. No pleural effusions. No evidence of pulmonary edema. Heart size is mildly enlarged. Upper mediastinal contours are distorted by patient positioning, but are similar to prior examinations.  IMPRESSION: 1. No radiographic evidence of acute cardiopulmonary disease. 2. Mild cardiomegaly.   Electronically Signed   By: Vinnie Langton M.D.   On: 08/21/2014 15:56   Ir Cholangiogram Existing Tube  08/22/2014   CLINICAL DATA:  Calculus cholecystitis, status post percutaneous transhepatic cholecystostomy abdomen  EXAM: CHOLANGIOGRAM VIA EXISTING CATHETER  COMPARISON:  08/19/2014, 08/18/2014  FINDINGS: Cholangiogram performed through the existing cholecystostomy. Numerous filling defects within the gallbladder compatible with cholelithiasis. Small filling defects demonstrated within the proximal cystic duct compatible with cystic duct stones. Contrast does not reach the common bile duct or duodenum.  IMPRESSION: Cholelithiasis with obstructing cystic duct stones.   Electronically Signed   By: Daryll Brod M.D.   On: 08/22/2014 12:12   Scheduled Meds: . aspirin  81 mg Oral Daily  . baclofen  5 mg Oral BID  . feeding supplement (PRO-STAT SUGAR FREE 64)  30 mL Oral TID WC  . furosemide  20 mg Intravenous Daily  . heparin  5,000 Units Subcutaneous 3 times per day  . insulin aspart  0-9 Units Subcutaneous 3 times per day  . pantoprazole (PROTONIX) IV  40 mg Intravenous Q24H  . piperacillin-tazobactam (ZOSYN)  IV  3.375 g Intravenous Q8H   Continuous Infusions:  PRN Meds:.acetaminophen, acetaminophen-codeine, albuterol, alum & mag hydroxide-simeth, morphine injection, nitroGLYCERIN, [DISCONTINUED] ondansetron **OR** ondansetron (ZOFRAN) IV, sodium chloride  ASSESMENT:   *  Acuteholecystitis, bile growing stretp Viridans.  S/p 1/29 cholecystostomy tube.  LFTs continue improving.  Per d/w Dr Rosendo Gros, tube will need to stay in place at least 6 weeks and he recommends 7 more days of abx (zosyn at present).   *  ? Choledocholithiasis.  Cholangiogram 2/1 with obstructing stones in cystic duct, unable to determine the status of CBD.  *  IDDM.   *  Widespread vascular disease: CAD, PVD.  S/p bil AKA, DES 2007.    PLAN   *  Heart healthy diet. PO protonix.   *  MRCP ordered. Hopefully will help to rule in or out the question of CBD stones.   *  Long term plans re chole tube and cholecystectomy per surgery. They plan to arrange outpt hospital follow up, have not ruled out future cholecystectomy.   At present looks like he could tolerate surgery.     Azucena Freed  08/23/2014, 8:16 AM Pager: [phone]

## 2014-08-23 NOTE — Clinical Social Work Placement (Addendum)
Clinical Social Work Department CLINICAL SOCIAL WORK PLACEMENT NOTE 08/23/2014  Patient:  Jaime [name], Jaime [name]  Account Number:  [id number] Admit date:  08/18/2014  Clinical Social Worker:  Domenica Reamer, CLINICAL SOCIAL WORKER  Date/time:  08/22/2014 12:12 PM  Clinical Social Work is seeking post-discharge placement for this patient at the following level of care:   [place]   (*CSW will update this form in Epic as items are completed)   08/22/2014  Patient/family provided with [facility] Department of Clinical Social Work's list of facilities offering this level of care within the geographic area requested by the patient (or if unable, by the patient's family).  08/22/2014  Patient/family informed of their freedom to choose among providers that offer the needed level of care, that participate in Medicare, Medicaid or managed care program needed by the patient, have an available bed and are willing to accept the patient.  08/22/2014  Patient/family informed of [facility]' ownership interest in [facility], as well as of the fact that they are under no obligation to receive care at this facility.  PASARR submitted to EDS on 08/22/2014 PASARR number received on 08/22/2014  FL2 transmitted to all facilities in geographic area requested by pt/family on  08/22/2014 FL2 transmitted to all facilities within larger geographic area on   Patient informed that his/her managed care company has contracts with or will negotiate with  certain facilities, including the following:     Patient/family informed of bed offers received:   Patient chooses bed at  Physician recommends and patient chooses bed at    Patient to be transferred to  on   Patient to be transferred to facility by  Patient and family notified of transfer on  Name of family member notified:    The following physician request were entered in Epic:   Additional Comments: Domenica Reamer, [place] Social  Worker [phone]

## 2014-08-23 NOTE — Clinical Social Work Psychosocial (Signed)
Clinical Social Work Department BRIEF PSYCHOSOCIAL ASSESSMENT 08/23/2014  Patient:  Jaime [name], Jaime [name]     Account Number:  [id number]     Admit date:  08/18/2014  Clinical Social Worker:  Domenica Reamer, CLINICAL SOCIAL WORKER  Date/Time:  08/22/2014 11:00 AM  Referred by:  Physician  Date Referred:  08/22/2014 Referred for  SNF Placement   Other Referral:   Interview type:  Patient Other interview type:   patient wife also at bedside    PSYCHOSOCIAL DATA Living Status:  WIFE Admitted from facility:   Level of care:   Primary support name:  Jaime [name] Primary support relationship to patient:  SPOUSE Degree of support available:   high level of support- wife has been primary caregiver for long time at home    CURRENT CONCERNS Current Concerns  Post-Acute Placement   Other Concerns:    SOCIAL WORK ASSESSMENT / PLAN CSW spoke with pt wife and pt concerning SNF placement- they are agreeable to short term rehab to try and increase pt mobility so that he will be more manageble at home- pt is bilateral amputee and has MS- wife has been caregiver for long time and is incapable of handling pt at this time.    Pt is agreeable to placement but is hopeful that he will be able to go to [place] following   Assessment/plan status:  Psychosocial Support/Ongoing Assessment of Needs Other assessment/ plan:   FL2 updated   Information/referral to community resources:    PATIENT'S/FAMILY'S RESPONSE TO PLAN OF CARE: Pt is agreeable to placement for short term rehab and is hopeful that he will be back at baseline

## 2014-08-23 NOTE — Progress Notes (Signed)
Subjective: Pt with no new concerns  Objective: Vital signs in last 24 hours: Temp:  [97.8 F (36.6 C)-98.3 F (36.8 C)] 98 F (36.7 C) (02/02 0400) Pulse Rate:  [80-99] 83 (02/02 0600) Resp:  [16-25] 21 (02/02 0600) BP: (96-150)/(62-76) 150/70 mmHg (02/02 0600) SpO2:  [90 %-100 %] 96 % (02/02 0600) Last BM Date: 08/21/14  Intake/Output from previous day: 02/01 0701 - 02/02 0700 In: 1140 [P.O.:435; I.V.:700] Out: 3490 [Urine:3400; Drains:90] Intake/Output this shift:    General appearance: alert and cooperative GI: soft, non-tender; bowel sounds normal; no masses,  no organomegaly and Drain bilious  Lab Results:   Recent Labs  08/21/14 0310 08/22/14 0605  WBC 8.6 8.0  HGB 12.7* 10.8*  HCT 38.3* 32.6*  PLT 211 223   BMET  Recent Labs  08/21/14 0310 08/22/14 0605  NA 135 136  K 4.7 4.0  CL 110 110  CO2 21 22  GLUCOSE 132* 116*  BUN 22 18  CREATININE 1.06 0.88  CALCIUM 7.8* 7.7*   PT/INR No results for input(s): LABPROT, INR in the last 72 hours. ABG  Recent Labs  08/21/14 1646  PHART 7.384  HCO3 16.9*    Studies/Results: Dg Chest Port 1 View  08/21/2014   CLINICAL DATA:  74 year old male with shortness breath.  EXAM: PORTABLE CHEST - 1 VIEW  COMPARISON:  Chest x-ray 08/18/2014.  FINDINGS: Lung volumes are low. No consolidative airspace disease. No pleural effusions. No evidence of pulmonary edema. Heart size is mildly enlarged. Upper mediastinal contours are distorted by patient positioning, but are similar to prior examinations.  IMPRESSION: 1. No radiographic evidence of acute cardiopulmonary disease. 2. Mild cardiomegaly.   Electronically Signed   By: Vinnie Langton M.D.   On: 08/21/2014 15:56   Ir Cholangiogram Existing Tube  08/22/2014   CLINICAL DATA:  Calculus cholecystitis, status post percutaneous transhepatic cholecystostomy abdomen  EXAM: CHOLANGIOGRAM VIA EXISTING CATHETER  COMPARISON:  08/19/2014, 08/18/2014  FINDINGS: Cholangiogram  performed through the existing cholecystostomy. Numerous filling defects within the gallbladder compatible with cholelithiasis. Small filling defects demonstrated within the proximal cystic duct compatible with cystic duct stones. Contrast does not reach the common bile duct or duodenum.  IMPRESSION: Cholelithiasis with obstructing cystic duct stones.   Electronically Signed   By: Daryll Brod M.D.   On: 08/22/2014 12:12    Anti-infectives: Anti-infectives    Start     Dose/Rate Route Frequency Ordered Stop   08/19/14 1515  levofloxacin (LEVAQUIN) IVPB 500 mg  Status:  Discontinued     500 mg100 mL/hr over 60 Minutes Intravenous Every 24 hours 08/19/14 1514 08/20/14 1616   08/19/14 1500  vancomycin (VANCOCIN) IVPB 750 mg/150 ml premix  Status:  Discontinued     750 mg150 mL/hr over 60 Minutes Intravenous Every 12 hours 08/19/14 1450 08/21/14 0807   08/19/14 0300  vancomycin (VANCOCIN) IVPB 750 mg/150 ml premix  Status:  Discontinued     750 mg150 mL/hr over 60 Minutes Intravenous Every 12 hours 08/18/14 1359 08/19/14 1122   08/18/14 2000  piperacillin-tazobactam (ZOSYN) IVPB 3.375 g     3.375 g12.5 mL/hr over 240 Minutes Intravenous Every 8 hours 08/18/14 1359     08/18/14 1315  piperacillin-tazobactam (ZOSYN) IVPB 3.375 g     3.375 g100 mL/hr over 30 Minutes Intravenous  Once 08/18/14 1300 08/18/14 1425   08/18/14 1315  vancomycin (VANCOCIN) IVPB 1000 mg/200 mL premix     1,000 mg200 mL/hr over 60 Minutes Intravenous  Once 08/18/14 1300  08/18/14 1527      Assessment/Plan: Cholecystitis s/p Perc c-tube OK to resume PO Con't abx x 1 week DC Ok to flush drain with 5cc saline TID F/u as directed Call with questions   LOS: 5 days    Rosario Jacks., [facility] 08/23/2014

## 2014-08-23 NOTE — Discharge Instructions (Signed)
DRAIN CARE:   You have a closed bulb drain to help you heal.  A bulb drain is a small, plastic reservoir which creates a gentle suction. It is used to remove excess fluid from a surgical wound. The color and amount of fluid will vary. Immediately after surgery, the fluid is bright red. It may gradually change to a yellow color. When the amount decreases to about 1 or 2 tablespoons (15 to 30 cc) per 24 hours, your caregiver will usually remove it.  DAILY CARE  Keep the bulb compressed at all times, except while emptying it. The compression creates suction.   Keep sites where the tubes enter the skin dry and covered with a light bandage (dressing).   Tape the tubes to your skin, 1 to 2 inches below the insertion sites, to keep from pulling on your stitches. Tubes are stitched in place and will not slip out.   Pin the bulb to your shirt (not to your pants) with a safety pin.   For the first few days after surgery, there usually is more fluid in the bulb. Empty the bulb whenever it becomes half full because the bulb does not create enough suction if it is too full. Include this amount in your 24 hour totals.   When the amount of drainage decreases, empty the bulb at the same time every day. Write down the amounts and the 24 hour totals. Your caregiver will want to know them. This helps your caregiver know when the tubes can be removed.   (We anticipate removing the drain in 1-3 weeks, depending on when the output is <3mL a day for 2+ days)  If there is drainage around the tube sites, change dressings and keep the area dry. If you see a clot in the tube, leave it alone. However, if the tube does not appear to be draining, let your caregiver know.  TO EMPTY THE BULB  Open the stopper to release suction.   Holding the stopper out of the way, pour drainage into the measuring cup that was sent home with you.   Measure and write down the amount. If there are 2 bulbs, note the amount of drainage  from bulb 1 or bulb 2 and keep the totals separate. Your caregiver will want to know which tube is draining more.   Compress the bulb by folding it in half.   Replace the stopper.   Check the tape that holds the tube to your skin, and pin the bulb to your shirt.  SEEK MEDICAL CARE IF:  The drainage develops a bad odor.   You have an oral temperature above 102 F (38.9 C).   The amount of drainage from your wound suddenly increases or decreases.   You accidentally pull out your drain.   You have any other questions or concerns.  MAKE SURE YOU:   Understand these instructions.   Will watch your condition.   Will get help right away if you are not doing well or get worse.     Call our office if you have any questions about your drain. [phone]

## 2014-08-24 ENCOUNTER — Inpatient Hospital Stay (HOSPITAL_COMMUNITY): Payer: Medicare Other

## 2014-08-24 DIAGNOSIS — I10 Essential (primary) hypertension: Secondary | ICD-10-CM

## 2014-08-24 DIAGNOSIS — K8050 Calculus of bile duct without cholangitis or cholecystitis without obstruction: Secondary | ICD-10-CM | POA: Insufficient documentation

## 2014-08-24 LAB — COMPREHENSIVE METABOLIC PANEL
ALT: 49 U/L (ref 0–53)
ANION GAP: 6 (ref 5–15)
AST: 34 U/L (ref 0–37)
Albumin: 2.4 g/dL — ABNORMAL LOW (ref 3.5–5.2)
Alkaline Phosphatase: 113 U/L (ref 39–117)
BUN: 9 mg/dL (ref 6–23)
CHLORIDE: 107 mmol/L (ref 96–112)
CO2: 26 mmol/L (ref 19–32)
Calcium: 8 mg/dL — ABNORMAL LOW (ref 8.4–10.5)
Creatinine, Ser: 0.72 mg/dL (ref 0.50–1.35)
GFR calc Af Amer: >90 mL/min (ref >90)
GLUCOSE: 149 mg/dL — AB (ref 70–99)
Potassium: 4.2 mmol/L (ref 3.5–5.1)
Sodium: 139 mmol/L (ref 135–145)
Total Bilirubin: 0.9 mg/dL (ref 0.3–1.2)
Total Protein: 6.1 g/dL (ref 6.0–8.3)

## 2014-08-24 LAB — CBC
HCT: 33.8 % — ABNORMAL LOW (ref 39.0–52.0)
HEMOGLOBIN: 11.1 g/dL — AB (ref 13.0–17.0)
MCH: 25.6 pg — AB (ref 26.0–34.0)
MCHC: 32.8 g/dL (ref 30.0–36.0)
MCV: 78.1 fL (ref 78.0–100.0)
Platelets: 294 10*3/uL (ref 150–400)
RBC: 4.33 MIL/uL (ref 4.22–5.81)
RDW: 16.5 % — AB (ref 11.5–15.5)
WBC: 8.5 10*3/uL (ref 4.0–10.5)

## 2014-08-24 LAB — GLUCOSE, CAPILLARY
GLUCOSE-CAPILLARY: 163 mg/dL — AB (ref 70–99)
Glucose-Capillary: 171 mg/dL — ABNORMAL HIGH (ref 70–99)
Glucose-Capillary: 193 mg/dL — ABNORMAL HIGH (ref 70–99)

## 2014-08-24 LAB — CULTURE, BLOOD (ROUTINE X 2): Culture: NO GROWTH

## 2014-08-24 MED ORDER — POLYETHYLENE GLYCOL 3350 17 G PO PACK
17.0000 g | PACK | Freq: Every day | ORAL | Status: DC | PRN
  Administered 2014-08-24: 17 g via ORAL
  Filled 2014-08-24 (×3): qty 1

## 2014-08-24 MED ORDER — BENAZEPRIL HCL 10 MG PO TABS
10.0000 mg | ORAL_TABLET | Freq: Every day | ORAL | Status: DC
  Administered 2014-08-24 – 2014-08-26 (×3): 10 mg via ORAL
  Filled 2014-08-24 (×3): qty 1

## 2014-08-24 MED ORDER — GADOBENATE DIMEGLUMINE 529 MG/ML IV SOLN
15.0000 mL | Freq: Once | INTRAVENOUS | Status: AC | PRN
  Administered 2014-08-24: 15 mL via INTRAVENOUS

## 2014-08-24 NOTE — Progress Notes (Signed)
Daily Rounding Note  08/24/2014, 10:31 AM  LOS: 6 days   SUBJECTIVE:       Biliary drain output 90 ml 2/2, 100 ml so far today. Pain is moderate, in LUQ > RUQ.  No nausea.  Last BM was ~ 2/1 and would like a laxative.  Tolerating solids.   OBJECTIVE:         Vital signs in last 24 hours:    Temp:  [98.3 F (36.8 C)-99.8 F (37.7 C)] 98.4 F (36.9 C) (02/03 3545) Pulse Rate:  [80-88] 88 (02/03 0633) Resp:  [18-22] 18 (02/02 2106) BP: (155-165)/(76-92) 160/82 mmHg (02/03 0633) SpO2:  [94 %-100 %] 97 % (02/03 0633) Last BM Date: 08/22/14 Filed Weights   08/18/14 1227 08/18/14 1759 08/22/14 0419  Weight: 155 lb (70.308 kg) 146 lb 14.4 oz (66.633 kg) 159 lb 9.8 oz (72.4 kg)   General: pleasant, alert, comfortable. Not ill looking   Heart: RRR.  No MRG Chest: clear bil.  No dyspnea or labored breathing Abdomen: soft, ND, active BS.  Perc drain site RUQ looks fine.  Some tenderness LUQ and RUQ.   Extremities: bil AKA Neuro/Psych:  Pleasant, alert, relaxed.  no   Intake/Output from previous day: 02/02 0701 - 02/03 0700 In: 34 [P.O.:538] Out: 3100 [Urine:3000; Drains:100]  Intake/Output this shift: Total I/O In: 5 [Other:5] Out: -   Lab Results:  Recent Labs  08/22/14 0605 08/23/14 0724 08/24/14 0715  WBC 8.0 7.5 8.5  HGB 10.8* 10.8* 11.1*  HCT 32.6* 32.1* 33.8*  PLT 223 280 294   BMET  Recent Labs  08/22/14 0605 08/23/14 0724 08/24/14 0715  NA 136 136 139  K 4.0 3.3* 4.2  CL 110 105 107  CO2 22 23 26   GLUCOSE 116* 124* 149*  BUN 18 12 9   CREATININE 0.88 0.79 0.72  CALCIUM 7.7* 8.0* 8.0*   LFT  Recent Labs  08/22/14 0605 08/23/14 0724 08/24/14 0715  PROT 5.7* 5.9* 6.1  ALBUMIN 2.0* 2.2* 2.4*  AST 24 26 34  ALT 62* 51 49  ALKPHOS 110 113 113  BILITOT 1.4* 1.0 0.9    Studies/Results: Mr Abd W/wo Cm/mrcp  08/24/2014   CLINICAL DATA:  Acute cholecystitis with sepsis. Status post  percutaneous cholecystostomy.  EXAM: MRI ABDOMEN WITHOUT AND WITH CONTRAST (INCLUDING MRCP)  TECHNIQUE: Multiplanar multisequence MR imaging of the abdomen was performed both before and after the administration of intravenous contrast. Heavily T2-weighted images of the biliary and pancreatic ducts were obtained, and three-dimensional MRCP images were rendered by post processing.  CONTRAST:  60mL MULTIHANCE GADOBENATE DIMEGLUMINE 529 MG/ML IV SOLN  COMPARISON:  CT scan 08/18/2016 and ultrasound 08/19/2014  FINDINGS: Lower chest: There are small bilateral pleural effusions, right greater than left with overlying atelectasis. The heart is mildly enlarged. No pericardial effusion.  Hepatobiliary: There is a simple appearing nonenhancing 14 mm cyst in segment 2. There is also a the enhancing lesion in segment 6 which measures 14 mm. This has typical MR imaging features of a benign hemangioma. No worrisome hepatic lesions or intrahepatic biliary dilatation. Borderline common bile duct dilatation at 7.5 mm. There are multiple small common bile duct stones. A cholecystostomy drainage catheter is noted in the gallbladder which is filled with stones and demonstrates wall thickening and pericholecystic fluid.  Pancreas: Moderate atrophy of the pancreatic tail. No inflammatory changes or mass lesions. Normal pancreatic duct.  Spleen: Normal size.  No focal lesions.  Adrenals/Urinary Tract: The adrenal glands are unremarkable. There are multiple bilateral renal cysts but no worrisome renal lesions or hydronephrosis.  Stomach/Bowel: The stomach, duodenum, visualized small bowel and visualized colon are grossly normal.  Vascular/Lymphatic: No mesenteric or retroperitoneal mass or adenopathy. The aorta and branch vessels are patent. Advanced distal aortic atherosclerotic disease better demonstrated on the CT scan. The major venous structures are patent.  Musculoskeletal: No significant bony findings. Significant scoliosis.   IMPRESSION: 1. Stone filled gallbladder containing a cholecystostomy drainage catheter. There is mild wall thickening and a small amount of pericholecystic fluid. 2. Several small common bile duct stones. No common bile duct dilatation. 3. Benign hepatic lesions including small cysts and a hemangioma. 4. Bilateral pleural effusions and bibasilar atelectasis. 5. Multiple simple appearing renal cysts.   Electronically Signed   By: Kalman Jewels M.D.   On: 08/24/2014 10:16   Ir Cholangiogram Existing Tube  08/22/2014   CLINICAL DATA:  Calculus cholecystitis, status post percutaneous transhepatic cholecystostomy abdomen  EXAM: CHOLANGIOGRAM VIA EXISTING CATHETER  COMPARISON:  08/19/2014, 08/18/2014  FINDINGS: Cholangiogram performed through the existing cholecystostomy. Numerous filling defects within the gallbladder compatible with cholelithiasis. Small filling defects demonstrated within the proximal cystic duct compatible with cystic duct stones. Contrast does not reach the common bile duct or duodenum.  IMPRESSION: Cholelithiasis with obstructing cystic duct stones.   Electronically Signed   By: Daryll Brod M.D.   On: 08/22/2014 12:12   Scheduled Meds: . amoxicillin-clavulanate  1 tablet Oral Q12H  . aspirin  81 mg Oral Daily  . baclofen  5 mg Oral BID  . benazepril  10 mg Oral Daily  . feeding supplement (PRO-STAT SUGAR FREE 64)  30 mL Oral TID WC  . heparin  5,000 Units Subcutaneous 3 times per day  . insulin aspart  0-5 Units Subcutaneous QHS  . insulin aspart  0-9 Units Subcutaneous TID WC  . pantoprazole  40 mg Oral Q0600   Continuous Infusions:  PRN Meds:.acetaminophen, acetaminophen-codeine, albuterol, alum & mag hydroxide-simeth, morphine injection, nitroGLYCERIN, [DISCONTINUED] ondansetron **OR** ondansetron (ZOFRAN) IV, polyethylene glycol, sodium chloride   ASSESMENT:   * Acute cholecystitis, bile growing stretp Viridans. S/p 1/29 cholecystostomy tube. LFTs continue improving.  Per d/w Dr Rosendo Gros, tube will need to stay in place at least 6 weeks and he recommends 7 more days of abx (Augmentin just replaced Zosyn ) which means last dose 08/30/14. .   * Choledocholithiasis. Cholangiogram 2/1 with obstructing stones in cystic duct, unable to determine the status of CBD.  MRCP with CBD stones but non-dilated CBD.   *  Anemia.  Earlier this admission MCV low at 39, currently normal.  10/2009 colonoscopy:diminutive "polyp" but pathology c/w vegetable material.  * IDDM.   * Widespread vascular disease: CAD, PVD. 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   On 81 ASA.     *   Multiple sclerosis, bed-ridden with this and bil AKA.    PLAN   *  ERCP, 2/4 @ 14:15.  Discussed risks, benefits, alternatives with pt.  Hold q 8 hour Heparin after last dose tonight. Preop Unasyn.  Post op rectal Indocin.  *  Surgery wishes IR to follow up the chole drain.  *  Finish Augmentin on 08/30/14.  *  Prn daily Miralax, per pt request.  *  Anemia panel in AM. .     Azucena Freed  08/24/2014, 10:31 AM Pager: [phone]

## 2014-08-24 NOTE — Progress Notes (Signed)
PROGRESS NOTE  Jaime [name] UVO:[id number] DOB: 09/14/1940 DOA: 08/18/2014 PCP: [name], MD  Assessment/Plan: 1. Sepsis with lactic acidosis due to acute cholecystitis. Gen. surgery and GI following, post percutaneous gallbladder drain placement by IR on 08/19/2014 as he is deemed to be a poor operative candidate by surgery. Was on Empiric IV Zosyn from 08-18-14 to 08-23-2014, switched to Augmentin on 08/23/2014 based on cultures . Liver enzymes trending down. -Plan Augmentin through 08/30/2014 -Per general surgery, cholecystotomy tube will need to stay in place for 6 weeks--surgery which is IR to follow up on the chole drain   2. Choledocholithiasis -Cholangiogram on 08/22/2014 showed obstructing stones in the cystic duct -08/24/2014 MRCP showed choledocholithiasis without rehabilitation -Plans noted for ERCP -appreciate GI  3. Hypotension. Due to #1 above. Blood pressure is better holding IV fluids now. -resolved   4. DM type II. Sugars borderline. on low dose sliding scale and monitor.     5. UTI. Treated with Zosyn. -Proteus mirabilis  -Continue Augmentin     6. Acute on Chronic systolic heart failure. EF now 40% improved from 35% - secondary to fluid overload and third spacing. IV fluids held, added protein supplement, resolved after Lasix. Initiate low-dose Coreg, low-dose Ace  -Patient cardiology input--> monitor off furosemide     7. Elevated troponin with nonspecific EKG changes. Cardiology was called thought this is secondary to sepsis and did not want to provide formal consultation. Troponin trend appears to be non-ACS pattern and seems to be due to demand ischemia from sepsis. Placed on 81 mg aspirin, Low dose B blocker if BP stays stable. Echogram noted with improved EF of 40% continues to have diffuse hypokinesis similar to recent echo in 2015. No chest pain. Appreciate cardiology input. OutPatient cardiology follow-up once septic issues resolved.      8. 1 out of 2 sets of blood culture positive for gram-positive cocci in clusters. Now Caog -ve. contamination. Stopped Vancomycin.    9. 3rd spacing of fluids - increase PO intake added Prostat , discontinued IV fluids, resolved with Lasix.    9. H/O MS, Bilat AKA - supportive Rx.      Family Communication:   Wife updated at beside Disposition Plan:   LTAC when cleared by GI            Procedures/Studies: Ct Abdomen Pelvis W Contrast  08/18/2014   CLINICAL DATA:  Periumbilical and right upper quadrant pain for 2 days. Elevated white blood cell count and elevated lactic acid.  EXAM: CT ABDOMEN AND PELVIS WITH CONTRAST  TECHNIQUE: Multidetector CT imaging of the abdomen and pelvis was performed using the standard protocol following bolus administration of intravenous contrast.  CONTRAST:  100 mL OMNIPAQUE IOHEXOL 300 MG/ML  SOLN  COMPARISON:  CT abdomen and pelvis 10/09/2010.  FINDINGS: Mild dependent atelectasis is present in the lung bases. Heart size is enlarged. There is calcific coronary artery disease.  The gallbladder is markedly dilated with wall thickening and surrounding inflammatory change. The liver is diffusely low attenuating with a 1.4 cm cyst on image 15 which is unchanged in appearance. The biliary tree, adrenal glands, spleen and pancreas appear normal. Bilateral renal cysts are noted.  The patient has aortoiliac atherosclerosis. The right common iliac artery is dilated at 1.8 cm. The left common iliac artery measures 1.5 cm. There is a large volume of stool in the cecum. The colon is otherwise unremarkable. The stomach and small bowel  appear normal. No lymphadenopathy is identified. Small fat containing umbilical hernia is noted.  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 No other focal bony lesion is identified.  IMPRESSION: Findings highly suspicious for acute cholecystitis. Right upper quadrant ultrasound could be used for further  evaluation.  Fatty infiltration of the liver.  Calcific aortic and coronary atherosclerosis. 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  Small fat containing umbilical hernia.  Avascular necrosis of the femoral heads bilaterally.   Electronically Signed   By: Inge Rise M.D.   On: 08/18/2014 16:23   Ir Perc Cholecystostomy  08/19/2014   INDICATION: 74 year old male with a history of acute cholecystitis. He has been referred for evaluation for cholecystostomy placement.  EXAM: ULTRASOUND AND FLUOROSCOPIC-GUIDED CHOLECYSTOSTOMY TUBE PLACEMENT  COMPARISON:  None.  MEDICATIONS: Fentanyl 125 mcg IV; Versed 0 mg IV; The patient is currently admitted to the hospital and on intravenous antibiotics. 500 mg of Levaquin were administered periprocedure.  25 mg Demerol IV  ANESTHESIA/SEDATION: Total Moderate Sedation Time  Zero minutes  CONTRAST:  5mL OMNIPAQUE IOHEXOL 300 MG/ML  SOLN  FLUOROSCOPY TIME:  minutes  COMPLICATIONS: None immediate.  PROCEDURE: Informed written consent was obtained from the patient after a discussion of the risks, benefits and alternatives to treatment. Questions regarding the procedure were encouraged and answered. A timeout was performed prior to the initiation of the procedure.  The right upper abdominal quadrant was prepped and draped in the usual sterile fashion, and a sterile drape was applied covering the operative field. Maximum barrier sterile technique with sterile gowns and gloves were used for the procedure. A timeout was performed prior to the initiation of the procedure. Local anesthesia was provided with 1% lidocaine with epinephrine.  Ultrasound scanning of the right upper quadrant demonstrates a markedly dilated gallbladder. Utilizing a transhepatic approach, a 22 gauge needle was advanced into the gallbladder under direct ultrasound guidance. An ultrasound image was saved for documentation purposes.  Appropriate intraluminal puncture was confirmed with the efflux of bile and advancement of an 0.018 wire into the gallbladder lumen. The needle was exchanged for an [place] set. A small amount of contrast was injected to confirm appropriate intraluminal positioning. Over a Benson wire, a 52.2-French Cook cholecystomy tube was advanced into the gallbladder fossa, coiled and locked. Bile was aspirated and a small amount of contrast was injected as several post procedural spot radiographic images were obtained in various obliquities. The catheter was secured to the skin with suture, connected to a drainage bag and a dressing was placed.  The patient tolerated the procedure well without immediate post procedural complication.  IMPRESSION: Status post percutaneous cholecystostomy tube placement. A sample of fluid was sent to the lab for analysis.  Signed,  Dulcy Fanny. Earleen Newport, DO  [facility]  [facility]   Electronically Signed   By: Corrie Mckusick D.O.   On: 08/19/2014 16:18   Dg Chest Port 1 View  08/21/2014   CLINICAL DATA:  74 year old male with shortness breath.  EXAM: PORTABLE CHEST - 1 VIEW  COMPARISON:  Chest x-ray 08/18/2014.  FINDINGS: Lung volumes are low. No consolidative airspace disease. No pleural effusions. No evidence of pulmonary edema. Heart size is mildly enlarged. Upper mediastinal contours are distorted by patient positioning, but are similar to prior examinations.  IMPRESSION: 1. No radiographic evidence of acute cardiopulmonary disease. 2. Mild cardiomegaly.   Electronically Signed  By: Vinnie Langton M.D.   On: 08/21/2014 15:56   Dg Chest Portable 1 View  08/18/2014   CLINICAL DATA:  Initial encounter for abdominal pain.  Ex-smoker.  EXAM: PORTABLE CHEST - 1 VIEW  COMPARISON:  12/16/2013  FINDINGS: Numerous leads and wires project over the chest. Midline trachea. Moderate cardiomegaly. No pleural effusion or pneumothorax. mild subsegmental  atelectasis at the left lung base. No gross free intraperitoneal air. Convex right thoracolumbar spine curvature.  IMPRESSION: Cardiomegaly, without acute disease.   Electronically Signed   By: Abigail Miyamoto M.D.   On: 08/18/2014 13:58   Mr Jeananne Rama W/wo Cm/mrcp  08/24/2014   CLINICAL DATA:  Acute cholecystitis with sepsis. Status post percutaneous cholecystostomy.  EXAM: MRI ABDOMEN WITHOUT AND WITH CONTRAST (INCLUDING MRCP)  TECHNIQUE: Multiplanar multisequence MR imaging of the abdomen was performed both before and after the administration of intravenous contrast. Heavily T2-weighted images of the biliary and pancreatic ducts were obtained, and three-dimensional MRCP images were rendered by post processing.  CONTRAST:  41mL MULTIHANCE GADOBENATE DIMEGLUMINE 529 MG/ML IV SOLN  COMPARISON:  CT scan 08/18/2016 and ultrasound 08/19/2014  FINDINGS: Lower chest: There are small bilateral pleural effusions, right greater than left with overlying atelectasis. The heart is mildly enlarged. No pericardial effusion.  Hepatobiliary: There is a simple appearing nonenhancing 14 mm cyst in segment 2. There is also a the enhancing lesion in segment 6 which measures 14 mm. This has typical MR imaging features of a benign hemangioma. No worrisome hepatic lesions or intrahepatic biliary dilatation. Borderline common bile duct dilatation at 7.5 mm. There are multiple small common bile duct stones. A cholecystostomy drainage catheter is noted in the gallbladder which is filled with stones and demonstrates wall thickening and pericholecystic fluid.  Pancreas: Moderate atrophy of the pancreatic tail. No inflammatory changes or mass lesions. Normal pancreatic duct.  Spleen: Normal size.  No focal lesions.  Adrenals/Urinary Tract: The adrenal glands are unremarkable. There are multiple bilateral renal cysts but no worrisome renal lesions or hydronephrosis.  Stomach/Bowel: The stomach, duodenum, visualized small bowel and visualized colon are  grossly normal.  Vascular/Lymphatic: No mesenteric or retroperitoneal mass or adenopathy. The aorta and branch vessels are patent. Advanced distal aortic atherosclerotic disease better demonstrated on the CT scan. The major venous structures are patent.  Musculoskeletal: No significant bony findings. Significant scoliosis.  IMPRESSION: 1. Stone filled gallbladder containing a cholecystostomy drainage catheter. There is mild wall thickening and a small amount of pericholecystic fluid. 2. Several small common bile duct stones. No common bile duct dilatation. 3. Benign hepatic lesions including small cysts and a hemangioma. 4. Bilateral pleural effusions and bibasilar atelectasis. 5. Multiple simple appearing renal cysts.   Electronically Signed   By: Kalman Jewels M.D.   On: 08/24/2014 10:16   Ir Cholangiogram Existing Tube  08/22/2014   CLINICAL DATA:  Calculus cholecystitis, status post percutaneous transhepatic cholecystostomy abdomen  EXAM: CHOLANGIOGRAM VIA EXISTING CATHETER  COMPARISON:  08/19/2014, 08/18/2014  FINDINGS: Cholangiogram performed through the existing cholecystostomy. Numerous filling defects within the gallbladder compatible with cholelithiasis. Small filling defects demonstrated within the proximal cystic duct compatible with cystic duct stones. Contrast does not reach the common bile duct or duodenum.  IMPRESSION: Cholelithiasis with obstructing cystic duct stones.   Electronically Signed   By: Daryll Brod M.D.   On: 08/22/2014 12:12   US Abdomen Limited Ruq  08/19/2014   CLINICAL DATA:  Right upper quadrant abdominal pain.  EXAM: US ABDOMEN LIMITED - RIGHT UPPER  QUADRANT  COMPARISON:  None.  FINDINGS: Gallbladder:  Gallbladder is moderately distended. Multiple stones and sludge are demonstrated in the gallbladder. Largest stone measures about 8 mm diameter. Focal area of mild gallbladder wall thickening up to 3.3 mm. Murphy's sign is negative.  Common bile duct:  Diameter: 6.3 mm,  upper limits normal. Mild intrahepatic bile duct dilatation is suggested. No filling defects are demonstrated although portions of the bowel duct are obscured by bowel gas.  Liver:  With diffusely increased coarsened liver echotexture likely representing fatty infiltration. No focal lesion definitively identified.  IMPRESSION: Cholelithiasis and gallbladder sludge with mild gallbladder wall thickening and distention. Changes may indicate acute cholecystitis although Murphy's sign is negative. Mild intrahepatic bile duct dilatation of nonspecific etiology.   Electronically Signed   By: Lucienne Capers M.D.   On: 08/19/2014 06:08         Subjective:  patient complains of some mild right upper quadrant tenderness. He denies any nausea, vomiting, diarrhea, chest pain, shortness breath, coughing, fevers, chills   Objective: Filed Vitals:   08/23/14 1406 08/23/14 2106 08/24/14 0633 08/24/14 1404  BP: 165/92 155/76 160/82 147/75  Pulse: 80 82 88 97  Temp: 98.3 F (36.8 C) 99.8 F (37.7 C) 98.4 F (36.9 C) 98.3 F (36.8 C)  TempSrc: Oral Oral Oral Oral  Resp: 22 18  18   Height:      Weight:      SpO2: 94% 100% 97% 97%    Intake/Output Summary (Last 24 hours) at 08/24/14 1933 Last data filed at 08/24/14 1800  Gross per 24 hour  Intake    673 ml  Output   1650 ml  Net   -977 ml   Weight change:  Exam:   General:  Pt is alert, follows commands appropriately, not in acute distress  HEENT: No icterus, No thrush,  [place]/AT  Cardiovascular: RRR, S1/S2, no rubs, no gallops  Respiratory: CTA bilaterally, no wheezing, no crackles, no rhonchi  Abdomen: Soft/+BS, epigastric and right upper quadrant tenderness without any rebound, non distended, no guarding Extremities: No edema, No lymphangitis, No petechiae, No rashes, no synovitis; bilateral AKA Data Reviewed: Basic Metabolic Panel:  Recent Labs Lab 08/20/14 0305 08/21/14 0310 08/22/14 0605 08/23/14 0724 08/24/14 0715  NA 135  135 136 136 139  K 3.4* 4.7 4.0 3.3* 4.2  CL 107 110 110 105 107  CO2 22 21 22 23 26   GLUCOSE 196* 132* 116* 124* 149*  BUN 18 22 18 12 9   CREATININE 0.97 1.06 0.88 0.79 0.72  CALCIUM 7.7* 7.8* 7.7* 8.0* 8.0*  MG  --   --   --  1.6  --    Liver Function Tests:  Recent Labs Lab 08/20/14 0305 08/21/14 0310 08/22/14 0605 08/23/14 0724 08/24/14 0715  AST 126* 41* 24 26 34  ALT 179* 96* 62* 51 49  ALKPHOS 174* 125* 110 113 113  BILITOT 3.7* 1.3* 1.4* 1.0 0.9  PROT 5.6* 5.4* 5.7* 5.9* 6.1  ALBUMIN 2.2* 2.0* 2.0* 2.2* 2.4*    Recent Labs Lab 08/18/14 1250  LIPASE 20   No results for input(s): AMMONIA in the last 168 hours. CBC:  Recent Labs Lab 08/18/14 1250  08/20/14 0305 08/21/14 0310 08/22/14 0605 08/23/14 0724 08/24/14 0715  WBC 19.9*  < > 10.2 8.6 8.0 7.5 8.5  NEUTROABS 17.3*  --   --   --   --   --   --   HGB 16.1  < > 11.4* 12.7*  10.8* 10.8* 11.1*  HCT 47.4  < > 34.0* 38.3* 32.6* 32.1* 33.8*  MCV 79.0  < > 76.9* 79.3 78.7 75.7* 78.1  PLT 256  < > 188 211 223 280 294  < > = values in this interval not displayed. Cardiac Enzymes:  Recent Labs Lab 08/18/14 2327 08/19/14 0643 08/21/14 1810 08/21/14 2200 08/22/14 0605  TROPONINI 0.06* 0.06* 0.03 0.03 0.03   BNP: Invalid input(s): POCBNP CBG:  Recent Labs Lab 08/23/14 1701 08/23/14 2102 08/24/14 0810 08/24/14 1239 08/24/14 1646  GLUCAP 123* 142* 171* 163* 193*    Recent Results (from the past 240 hour(s))  Blood Culture (routine x 2)     Status: None   Collection Time: 08/18/14  1:22 PM  Result Value Ref Range Status   Specimen Description BLOOD LEFT ANTECUBITAL  Final   Special Requests BOTTLES DRAWN AEROBIC AND ANAEROBIC 10CC  Final   Culture   Final    NO GROWTH 5 DAYS Performed at [facility]    Report Status 08/24/2014 FINAL  Final  Blood Culture (routine x 2)     Status: None   Collection Time: 08/18/14  1:27 PM  Result Value Ref Range Status   Specimen Description  BLOOD HAND LEFT  Final   Special Requests BOTTLES DRAWN AEROBIC ONLY 5CC  Final   Culture   Final    STAPHYLOCOCCUS SPECIES (COAGULASE NEGATIVE) Note: THE SIGNIFICANCE OF ISOLATING THIS ORGANISM FROM A SINGLE SET OF BLOOD CULTURES WHEN MULTIPLE SETS ARE DRAWN IS UNCERTAIN. PLEASE NOTIFY THE MICROBIOLOGY DEPARTMENT WITHIN ONE WEEK IF SPECIATION AND SENSITIVITIES ARE REQUIRED. Note: Gram Stain Report Called to,Read Back By and Verified With: ALEX SOLOMON 08/19/14 1435 BY SMITHERSJ Performed at [facility]    Report Status 08/20/2014 FINAL  Final  Urine culture     Status: None   Collection Time: 08/18/14  1:53 PM  Result Value Ref Range Status   Specimen Description URINE, CATHETERIZED  Final   Special Requests NONE  Final   Colony Count   Final    >=100,000 COLONIES/ML Performed at [facility]    Culture   Final    PROTEUS MIRABILIS Performed at [facility]    Report Status 08/20/2014 FINAL  Final   Organism ID, Bacteria PROTEUS MIRABILIS  Final      Susceptibility   Proteus mirabilis - MIC*    AMPICILLIN <=2 SENSITIVE Sensitive     CEFAZOLIN <=4 SENSITIVE Sensitive     CEFTRIAXONE <=1 SENSITIVE Sensitive     CIPROFLOXACIN <=0.25 SENSITIVE Sensitive     GENTAMICIN <=1 SENSITIVE Sensitive     LEVOFLOXACIN <=0.12 SENSITIVE Sensitive     NITROFURANTOIN 128 RESISTANT Resistant     TOBRAMYCIN <=1 SENSITIVE Sensitive     TRIMETH/SULFA <=20 SENSITIVE Sensitive     PIP/TAZO <=4 SENSITIVE Sensitive     * PROTEUS MIRABILIS  MRSA PCR Screening     Status: None   Collection Time: 08/18/14  6:02 PM  Result Value Ref Range Status   MRSA by PCR NEGATIVE NEGATIVE Final    Comment:        The GeneXpert MRSA Assay (FDA approved for NASAL specimens only), is one component of a comprehensive MRSA colonization surveillance program. It is not intended to diagnose MRSA infection nor to guide or monitor treatment for MRSA infections.   Body fluid culture      Status: None   Collection Time: 08/19/14  4:09 PM  Result Value Ref Range  Status   Specimen Description BILE  Final   Special Requests NONE  Final   Gram Stain   Final    NO WBC SEEN MODERATE GRAM POSITIVE COCCI IN CHAINS Performed at [facility]    Culture   Final    ABUNDANT VIRIDANS STREPTOCOCCUS Performed at [facility]    Report Status 08/22/2014 FINAL  Final   Organism ID, Bacteria VIRIDANS STREPTOCOCCUS  Final      Susceptibility   Viridans streptococcus - MIC (ETEST)*    PENICILLIN <=0.125 SENSITIVE Sensitive     * ABUNDANT VIRIDANS STREPTOCOCCUS  Clostridium Difficile by PCR     Status: None   Collection Time: 08/21/14 10:55 AM  Result Value Ref Range Status   C difficile by pcr NEGATIVE NEGATIVE Final     Scheduled Meds: . amoxicillin-clavulanate  1 tablet Oral Q12H  . aspirin  81 mg Oral Daily  . baclofen  5 mg Oral BID  . benazepril  10 mg Oral Daily  . feeding supplement (PRO-STAT SUGAR FREE 64)  30 mL Oral TID WC  . heparin  5,000 Units Subcutaneous 3 times per day  . insulin aspart  0-5 Units Subcutaneous QHS  . insulin aspart  0-9 Units Subcutaneous TID WC  . pantoprazole  40 mg Oral Q0600   Continuous Infusions:    Sufyan Meidinger, DO  [facility] Pager [phone]  If 7PM-7AM, please contact night-coverage [email or web address] Password [email or web address] 08/24/2014, 7:33 PM   LOS: 6 days

## 2014-08-24 NOTE — Progress Notes (Signed)
Patient: Jaime [name] / Admit Date: 08/18/2014 / Date of Encounter: 08/24/2014, 11:10 AM   Subjective: Feeling much better. Denies recent CP or SOB. Abd remains slightly sore.  Objective: Telemetry: N/A since being on 5w but on 2C briefly monitored with NSR Physical Exam: Blood pressure 160/82, pulse 88, temperature 98.4 F (36.9 C), temperature source Oral, resp. rate 18, height 5\' 11"  (1.803 m), weight 159 lb 9.8 oz (72.4 kg), SpO2 97 %. General: Well developed, well nourished M, in no acute distress. Laying flat in bed Head: Normocephalic, atraumatic, sclera non-icteric, no xanthomas, nares are without discharge. Neck: JVP not elevated. Lungs: Clear bilaterally to auscultation without wheezes, rales, or rhonchi. Breathing is unlabored. Heart: RRR S1 S2 without murmurs, rubs, or gallops.  Abdomen: Soft, slightly tender. Extremities: No clubbing or cyanosis. 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 Neuro: Alert and oriented X 3. Moves all extremities spontaneously. Psych:  Responds to questions appropriately with a normal affect.   Intake/Output Summary (Last 24 hours) at 08/24/14 1110 Last data filed at 08/24/14 0845  Gross per 24 hour  Intake    548 ml  Output   2450 ml  Net  -1902 ml    Inpatient Medications:  . amoxicillin-clavulanate  1 tablet Oral Q12H  . aspirin  81 mg Oral Daily  . baclofen  5 mg Oral BID  . benazepril  10 mg Oral Daily  . feeding supplement (PRO-STAT SUGAR FREE 64)  30 mL Oral TID WC  . heparin  5,000 Units Subcutaneous 3 times per day  . insulin aspart  0-5 Units Subcutaneous QHS  . insulin aspart  0-9 Units Subcutaneous TID WC  . pantoprazole  40 mg Oral Q0600   Infusions:    Labs:  Recent Labs  08/23/14 0724 08/24/14 0715  NA 136 139  K 3.3* 4.2  CL 105 107  CO2 23 26  GLUCOSE 124* 149*  BUN 12 9  CREATININE 0.79 0.72  CALCIUM 8.0* 8.0*  MG 1.6  --     Recent Labs  08/23/14 0724 08/24/14 0715  AST 26 34  ALT 51 49    ALKPHOS 113 113  BILITOT 1.0 0.9  PROT 5.9* 6.1  ALBUMIN 2.2* 2.4*    Recent Labs  08/23/14 0724 08/24/14 0715  WBC 7.5 8.5  HGB 10.8* 11.1*  HCT 32.1* 33.8*  MCV 75.7* 78.1  PLT 280 294    Recent Labs  08/21/14 1810 08/21/14 2200 08/22/14 0605  TROPONINI 0.03 0.03 0.03   Invalid input(s): POCBNP No results for input(s): HGBA1C in the last 72 hours.   Radiology/Studies:  Ct Abdomen Pelvis W Contrast  08/18/2014   CLINICAL DATA:  Periumbilical and right upper quadrant pain for 2 days. Elevated white blood cell count and elevated lactic acid.  EXAM: CT ABDOMEN AND PELVIS WITH CONTRAST  TECHNIQUE: Multidetector CT imaging of the abdomen and pelvis was performed using the standard protocol following bolus administration of intravenous contrast.  CONTRAST:  100 mL OMNIPAQUE IOHEXOL 300 MG/ML  SOLN  COMPARISON:  CT abdomen and pelvis 10/09/2010.  FINDINGS: Mild dependent atelectasis is present in the lung bases. Heart size is enlarged. There is calcific coronary artery disease.  The gallbladder is markedly dilated with wall thickening and surrounding inflammatory change. The liver is diffusely low attenuating with a 1.4 cm cyst on image 15 which is unchanged in appearance. The biliary tree, adrenal glands, spleen and pancreas appear normal. Bilateral renal cysts are noted.  The patient has aortoiliac  atherosclerosis. The right common iliac artery is dilated at 1.8 cm. The left common iliac artery measures 1.5 cm. There is a large volume of stool in the cecum. The colon is otherwise unremarkable. The stomach and small bowel appear normal. No lymphadenopathy is identified. Small fat containing umbilical hernia is noted.  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 No other focal bony lesion is identified.  IMPRESSION: Findings highly suspicious for acute cholecystitis. Right upper quadrant ultrasound could be used for further evaluation.  Fatty infiltration of the  liver.  Calcific aortic and coronary atherosclerosis. 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  Small fat containing umbilical hernia.  Avascular necrosis of the femoral heads bilaterally.   Electronically Signed   By: Inge Rise M.D.   On: 08/18/2014 16:23   Ir Perc Cholecystostomy  08/19/2014   INDICATION: 74 year old male with a history of acute cholecystitis. He has been referred for evaluation for cholecystostomy placement.  EXAM: ULTRASOUND AND FLUOROSCOPIC-GUIDED CHOLECYSTOSTOMY TUBE PLACEMENT  COMPARISON:  None.  MEDICATIONS: Fentanyl 125 mcg IV; Versed 0 mg IV; The patient is currently admitted to the hospital and on intravenous antibiotics. 500 mg of Levaquin were administered periprocedure.  25 mg Demerol IV  ANESTHESIA/SEDATION: Total Moderate Sedation Time  Zero minutes  CONTRAST:  40mL OMNIPAQUE IOHEXOL 300 MG/ML  SOLN  FLUOROSCOPY TIME:  minutes  COMPLICATIONS: None immediate.  PROCEDURE: Informed written consent was obtained from the patient after a discussion of the risks, benefits and alternatives to treatment. Questions regarding the procedure were encouraged and answered. A timeout was performed prior to the initiation of the procedure.  The right upper abdominal quadrant was prepped and draped in the usual sterile fashion, and a sterile drape was applied covering the operative field. Maximum barrier sterile technique with sterile gowns and gloves were used for the procedure. A timeout was performed prior to the initiation of the procedure. Local anesthesia was provided with 1% lidocaine with epinephrine.  Ultrasound scanning of the right upper quadrant demonstrates a markedly dilated gallbladder. Utilizing a transhepatic approach, a 22 gauge needle was advanced into the gallbladder under direct ultrasound guidance. An ultrasound image was saved for documentation purposes. Appropriate intraluminal puncture was  confirmed with the efflux of bile and advancement of an 0.018 wire into the gallbladder lumen. The needle was exchanged for an Loa set. A small amount of contrast was injected to confirm appropriate intraluminal positioning. Over a Benson wire, a 56.2-French Cook cholecystomy tube was advanced into the gallbladder fossa, coiled and locked. Bile was aspirated and a small amount of contrast was injected as several post procedural spot radiographic images were obtained in various obliquities. The catheter was secured to the skin with suture, connected to a drainage bag and a dressing was placed.  The patient tolerated the procedure well without immediate post procedural complication.  IMPRESSION: Status post percutaneous cholecystostomy tube placement. A sample of fluid was sent to the lab for analysis.  Signed,  Dulcy Fanny. Earleen Newport, DO  [facility]  [facility]   Electronically Signed   By: Corrie Mckusick D.O.   On: 08/19/2014 16:18   Dg Chest Port 1 View  08/21/2014   CLINICAL DATA:  74 year old male with shortness breath.  EXAM: PORTABLE CHEST - 1 VIEW  COMPARISON:  Chest x-ray 08/18/2014.  FINDINGS: Lung volumes are low. No consolidative airspace disease. No pleural effusions. No evidence  of pulmonary edema. Heart size is mildly enlarged. Upper mediastinal contours are distorted by patient positioning, but are similar to prior examinations.  IMPRESSION: 1. No radiographic evidence of acute cardiopulmonary disease. 2. Mild cardiomegaly.   Electronically Signed   By: Vinnie Langton M.D.   On: 08/21/2014 15:56   Dg Chest Portable 1 View  08/18/2014   CLINICAL DATA:  Initial encounter for abdominal pain.  Ex-smoker.  EXAM: PORTABLE CHEST - 1 VIEW  COMPARISON:  12/16/2013  FINDINGS: Numerous leads and wires project over the chest. Midline trachea. Moderate cardiomegaly. No pleural effusion or pneumothorax. mild subsegmental atelectasis at the left lung base. No  gross free intraperitoneal air. Convex right thoracolumbar spine curvature.  IMPRESSION: Cardiomegaly, without acute disease.   Electronically Signed   By: Abigail Miyamoto M.D.   On: 08/18/2014 13:58   Mr Jeananne Rama W/wo Cm/mrcp  08/24/2014   CLINICAL DATA:  Acute cholecystitis with sepsis. Status post percutaneous cholecystostomy.  EXAM: MRI ABDOMEN WITHOUT AND WITH CONTRAST (INCLUDING MRCP)  TECHNIQUE: Multiplanar multisequence MR imaging of the abdomen was performed both before and after the administration of intravenous contrast. Heavily T2-weighted images of the biliary and pancreatic ducts were obtained, and three-dimensional MRCP images were rendered by post processing.  CONTRAST:  22mL MULTIHANCE GADOBENATE DIMEGLUMINE 529 MG/ML IV SOLN  COMPARISON:  CT scan 08/18/2016 and ultrasound 08/19/2014  FINDINGS: Lower chest: There are small bilateral pleural effusions, right greater than left with overlying atelectasis. The heart is mildly enlarged. No pericardial effusion.  Hepatobiliary: There is a simple appearing nonenhancing 14 mm cyst in segment 2. There is also a the enhancing lesion in segment 6 which measures 14 mm. This has typical MR imaging features of a benign hemangioma. No worrisome hepatic lesions or intrahepatic biliary dilatation. Borderline common bile duct dilatation at 7.5 mm. There are multiple small common bile duct stones. A cholecystostomy drainage catheter is noted in the gallbladder which is filled with stones and demonstrates wall thickening and pericholecystic fluid.  Pancreas: Moderate atrophy of the pancreatic tail. No inflammatory changes or mass lesions. Normal pancreatic duct.  Spleen: Normal size.  No focal lesions.  Adrenals/Urinary Tract: The adrenal glands are unremarkable. There are multiple bilateral renal cysts but no worrisome renal lesions or hydronephrosis.  Stomach/Bowel: The stomach, duodenum, visualized small bowel and visualized colon are grossly normal.  Vascular/Lymphatic:  No mesenteric or retroperitoneal mass or adenopathy. The aorta and branch vessels are patent. Advanced distal aortic atherosclerotic disease better demonstrated on the CT scan. The major venous structures are patent.  Musculoskeletal: No significant bony findings. Significant scoliosis.  IMPRESSION: 1. Stone filled gallbladder containing a cholecystostomy drainage catheter. There is mild wall thickening and a small amount of pericholecystic fluid. 2. Several small common bile duct stones. No common bile duct dilatation. 3. Benign hepatic lesions including small cysts and a hemangioma. 4. Bilateral pleural effusions and bibasilar atelectasis. 5. Multiple simple appearing renal cysts.   Electronically Signed   By: Kalman Jewels M.D.   On: 08/24/2014 10:16   Ir Cholangiogram Existing Tube  08/22/2014   CLINICAL DATA:  Calculus cholecystitis, status post percutaneous transhepatic cholecystostomy abdomen  EXAM: CHOLANGIOGRAM VIA EXISTING CATHETER  COMPARISON:  08/19/2014, 08/18/2014  FINDINGS: Cholangiogram performed through the existing cholecystostomy. Numerous filling defects within the gallbladder compatible with cholelithiasis. Small filling defects demonstrated within the proximal cystic duct compatible with cystic duct stones. Contrast does not reach the common bile duct or duodenum.  IMPRESSION: Cholelithiasis with obstructing cystic duct stones.  Electronically Signed   By: Daryll Brod M.D.   On: 08/22/2014 12:12   US Abdomen Limited Ruq  08/19/2014   CLINICAL DATA:  Right upper quadrant abdominal pain.  EXAM: US ABDOMEN LIMITED - RIGHT UPPER QUADRANT  COMPARISON:  None.  FINDINGS: Gallbladder:  Gallbladder is moderately distended. Multiple stones and sludge are demonstrated in the gallbladder. Largest stone measures about 8 mm diameter. Focal area of mild gallbladder wall thickening up to 3.3 mm. Murphy's sign is negative.  Common bile duct:  Diameter: 6.3 mm, upper limits normal. Mild intrahepatic  bile duct dilatation is suggested. No filling defects are demonstrated although portions of the bowel duct are obscured by bowel gas.  Liver:  With diffusely increased coarsened liver echotexture likely representing fatty infiltration. No focal lesion definitively identified.  IMPRESSION: Cholelithiasis and gallbladder sludge with mild gallbladder wall thickening and distention. Changes may indicate acute cholecystitis although Murphy's sign is negative. Mild intrahepatic bile duct dilatation of nonspecific etiology.   Electronically Signed   By: Lucienne Capers M.D.   On: 08/19/2014 06:08     Assessment and Plan  1. Sepsis with lactic acidosis due to acute cholecystitis with transaminitis s/p percutaneous drain placement 2. Other medical issues including severe MS, prior AKAs, microcytic anemia, DM uncontrolled A1C 8.9 3. Ischemic cardiomyopathy with chronic systolic CHF 4. Mildly elevated troponin felt due to sepsis (peak 0.08) 5. H/o CAD s/p PCI 2007  Stable from cardiac standpoint. 2D Echo 08/19/14: EF 35-40%, diffuse HK, no significant change from 02/2014.  voicemail requesting a follow-up appointment in several weeks was left, and our office will call the patient with this information. (Next appt with Ron Parker wasn't for a while.)  Hypertension: Most recent blood pressure readings have been elevated. Restart home dose of benazepril 10 mg daily. If blood pressures continue to be high, will restart metoprolol tomorrow.

## 2014-08-25 ENCOUNTER — Encounter (HOSPITAL_COMMUNITY): Payer: Self-pay | Admitting: *Deleted

## 2014-08-25 ENCOUNTER — Inpatient Hospital Stay (HOSPITAL_COMMUNITY): Payer: Medicare Other | Admitting: Anesthesiology

## 2014-08-25 ENCOUNTER — Encounter (HOSPITAL_COMMUNITY)
Admission: EM | Disposition: A | Discharge status: Nursing Home (Includes ICF) | Payer: Self-pay | Source: Home / Self Care | Attending: Internal Medicine

## 2014-08-25 ENCOUNTER — Inpatient Hospital Stay (HOSPITAL_COMMUNITY): Payer: Medicare Other

## 2014-08-25 DIAGNOSIS — K8050 Calculus of bile duct without cholangitis or cholecystitis without obstruction: Secondary | ICD-10-CM | POA: Insufficient documentation

## 2014-08-25 DIAGNOSIS — K8042 Calculus of bile duct with acute cholecystitis without obstruction: Secondary | ICD-10-CM

## 2014-08-25 HISTORY — PX: ERCP: SHX5425

## 2014-08-25 LAB — IRON AND TIBC
IRON: 36 ug/dL — AB (ref 42–165)
Saturation Ratios: 22 % (ref 20–55)
TIBC: 167 ug/dL — AB (ref 215–435)
UIBC: 131 ug/dL (ref 125–400)

## 2014-08-25 LAB — COMPREHENSIVE METABOLIC PANEL
ALBUMIN: 2.4 g/dL — AB (ref 3.5–5.2)
ALK PHOS: 109 U/L (ref 39–117)
ALT: 40 U/L (ref 0–53)
AST: 31 U/L (ref 0–37)
Anion gap: 8 (ref 5–15)
BUN: 12 mg/dL (ref 6–23)
CHLORIDE: 108 mmol/L (ref 96–112)
CO2: 24 mmol/L (ref 19–32)
CREATININE: 0.71 mg/dL (ref 0.50–1.35)
Calcium: 8.3 mg/dL — ABNORMAL LOW (ref 8.4–10.5)
GFR calc Af Amer: >90 mL/min (ref >90)
GFR calc non Af Amer: >90 mL/min (ref >90)
GLUCOSE: 108 mg/dL — AB (ref 70–99)
Potassium: 3.8 mmol/L (ref 3.5–5.1)
SODIUM: 140 mmol/L (ref 135–145)
Total Bilirubin: 0.8 mg/dL (ref 0.3–1.2)
Total Protein: 6.3 g/dL (ref 6.0–8.3)

## 2014-08-25 LAB — RETICULOCYTES
RBC.: 4.31 MIL/uL (ref 4.22–5.81)
RETIC COUNT ABSOLUTE: 43.1 10*3/uL (ref 19.0–186.0)
Retic Ct Pct: 1 % (ref 0.4–3.1)

## 2014-08-25 LAB — GLUCOSE, CAPILLARY
GLUCOSE-CAPILLARY: 140 mg/dL — AB (ref 70–99)
GLUCOSE-CAPILLARY: 131 mg/dL — AB (ref 70–99)
Glucose-Capillary: 101 mg/dL — ABNORMAL HIGH (ref 70–99)
Glucose-Capillary: 103 mg/dL — ABNORMAL HIGH (ref 70–99)

## 2014-08-25 LAB — FERRITIN: Ferritin: 557 ng/mL — ABNORMAL HIGH (ref 22–322)

## 2014-08-25 SURGERY — ERCP, WITH INTERVENTION IF INDICATED
Anesthesia: General

## 2014-08-25 MED ORDER — GLUCAGON HCL RDNA (DIAGNOSTIC) 1 MG IJ SOLR
INTRAMUSCULAR | Status: DC | PRN
  Administered 2014-08-25 (×3): .25 mg via INTRAVENOUS

## 2014-08-25 MED ORDER — SODIUM CHLORIDE 0.9 % IV SOLN
INTRAVENOUS | Status: DC | PRN
  Administered 2014-08-25: 15:00:00

## 2014-08-25 MED ORDER — LACTATED RINGERS IV SOLN
INTRAVENOUS | Status: DC | PRN
  Administered 2014-08-25: 14:00:00 via INTRAVENOUS

## 2014-08-25 MED ORDER — PROPOFOL 10 MG/ML IV BOLUS
INTRAVENOUS | Status: DC | PRN
  Administered 2014-08-25: 100 mg via INTRAVENOUS

## 2014-08-25 MED ORDER — HYDROMORPHONE HCL 1 MG/ML IJ SOLN
0.2500 mg | INTRAMUSCULAR | Status: DC | PRN
  Administered 2014-08-26: 0.5 mg via INTRAVENOUS
  Filled 2014-08-25: qty 1

## 2014-08-25 MED ORDER — PHENYLEPHRINE HCL 10 MG/ML IJ SOLN
10.0000 mg | INTRAVENOUS | Status: DC | PRN
  Administered 2014-08-25: 40 ug/min via INTRAVENOUS

## 2014-08-25 MED ORDER — FENTANYL CITRATE 0.05 MG/ML IJ SOLN
INTRAMUSCULAR | Status: DC | PRN
  Administered 2014-08-25: 100 ug via INTRAVENOUS

## 2014-08-25 MED ORDER — LIDOCAINE HCL (CARDIAC) 20 MG/ML IV SOLN
INTRAVENOUS | Status: DC | PRN
  Administered 2014-08-25: 100 mg via INTRAVENOUS

## 2014-08-25 MED ORDER — SODIUM CHLORIDE 0.9 % IV SOLN
INTRAVENOUS | Status: DC
  Administered 2014-08-25: 1000 mL via INTRAVENOUS

## 2014-08-25 MED ORDER — SUCCINYLCHOLINE CHLORIDE 20 MG/ML IJ SOLN
INTRAMUSCULAR | Status: DC | PRN
  Administered 2014-08-25: 100 mg via INTRAVENOUS

## 2014-08-25 MED ORDER — GLUCAGON HCL RDNA (DIAGNOSTIC) 1 MG IJ SOLR
INTRAMUSCULAR | Status: AC
  Filled 2014-08-25: qty 1

## 2014-08-25 MED ORDER — INDOMETHACIN 50 MG RE SUPP
100.0000 mg | Freq: Once | RECTAL | Status: AC
  Administered 2014-08-25: 100 mg via RECTAL
  Filled 2014-08-25: qty 2

## 2014-08-25 MED ORDER — PROMETHAZINE HCL 25 MG/ML IJ SOLN: 6.2500 mg | INTRAMUSCULAR | Status: DC | PRN

## 2014-08-25 MED ORDER — SODIUM CHLORIDE 0.9 % IV SOLN
1.5000 g | Freq: Once | INTRAVENOUS | Status: AC
  Administered 2014-08-25: 1.5 g via INTRAVENOUS
  Filled 2014-08-25 (×2): qty 1.5

## 2014-08-25 MED ORDER — LIDOCAINE HCL 4 % MT SOLN
OROMUCOSAL | Status: DC | PRN
  Administered 2014-08-25: 4 mL via TOPICAL

## 2014-08-25 MED ORDER — ONDANSETRON HCL 4 MG/2ML IJ SOLN
INTRAMUSCULAR | Status: DC | PRN
  Administered 2014-08-25: 4 mg via INTRAVENOUS

## 2014-08-25 NOTE — Interval H&P Note (Signed)
History and Physical Interval Note:  08/25/2014 2:02 PM  Jaime [name]  has presented today for surgery, with the diagnosis of CBD stones.  The various methods of treatment have been discussed with the patient and family. After consideration of risks, benefits and other options for treatment, the patient has consented to  Procedure(s): ENDOSCOPIC RETROGRADE CHOLANGIOPANCREATOGRAPHY (ERCP) (N/A) as a surgical intervention .  The patient's history has been reviewed, patient examined, no change in status, stable for surgery.  I have reviewed the patient's chart and labs.  Questions were answered to the patient's satisfaction.     Pricilla Riffle. Fuller Plan MD

## 2014-08-25 NOTE — Progress Notes (Signed)
PT Cancellation Note  Patient Details Name: Jaime [name] MRN: [id number] DOB: 01/31/1941   Cancelled Treatment:    Reason Eval/Treat Not Completed: Patient at procedure or test/unavailable   Reathel Turi 08/25/2014, 4:25 PM Pager [phone]

## 2014-08-25 NOTE — Transfer of Care (Signed)
Immediate Anesthesia Transfer of Care Note  Patient: Jaime [name]  Procedure(s) Performed: Procedure(s): ENDOSCOPIC RETROGRADE CHOLANGIOPANCREATOGRAPHY (ERCP) (N/A)  Patient Location: PACU and Endoscopy Unit  Anesthesia Type:General  Level of Consciousness: awake and sedated  Airway & Oxygen Therapy: Patient Spontanous Breathing and Patient connected to face mask oxygen  Post-op Assessment: Report given to RN, Post -op Vital signs reviewed and stable and Patient moving all extremities  Post vital signs: Reviewed and stable  Last Vitals:  Filed Vitals:   08/25/14 1539  BP: 130/85  Pulse: 86  Temp:   Resp: 15    Complications: No apparent anesthesia complications

## 2014-08-25 NOTE — Anesthesia Procedure Notes (Signed)
Procedure Name: Intubation Date/Time: 08/25/2014 2:47 PM Performed by: Izora Gala Pre-anesthesia Checklist: Patient identified, Emergency Drugs available, Suction available, Patient being monitored and Timeout performed Patient Re-evaluated:Patient Re-evaluated prior to inductionOxygen Delivery Method: Circle system utilized Preoxygenation: Pre-oxygenation with 100% oxygen Intubation Type: IV induction Ventilation: Mask ventilation without difficulty Laryngoscope Size: Miller and 3 Grade View: Grade I Tube type: Oral Tube size: 7.5 mm Number of attempts: 1 Airway Equipment and Method: Stylet Placement Confirmation: ETT inserted through vocal cords under direct vision,  positive ETCO2 and breath sounds checked- equal and bilateral Secured at: 23 cm Tube secured with: Tape Dental Injury: Teeth and Oropharynx as per pre-operative assessment

## 2014-08-25 NOTE — H&P (View-Only) (Signed)
[facility] Consult: 12:26 PM 08/19/2014  LOS: 1 day    Referring Provider: Dr. Candiss Norse  Primary Care Physician:  [name], MD Primary Gastroenterologist:  Dr. Ardis Hughs.     Reason for Consultation:  Evaluate for possible choledocholithiasis.    HPI: Jaime [name] is a 74 y.o. male.  Past medical history of vascular disease of the coronary arteries as well as peripheral vascular disease. He is status post lower extremity bypass grafting, s/p bil AKAs, Taxus DES placed in 2007.  Ischemic cardiomyopathy with EF 30-35% on echo of 02/2014.  IDDM.  Multiple sclerosis and in the wheelchair chronically.  History of adenomatous colon polyps in 2007. On repeat 10/2009 colonoscopy there was a diminutive transverse polyp( pathology showed fragments of vegetable matter) as well as a few scattered diverticula.  On 08/13/14 dopplers or aorta and vessels there was decreased visualization of the abdominal vasculature or due to overlying bowel gas. Conclusion of study was there was no evidence for hemodynamically significant stenosis in the aortoiliac system   Patient admitted yesterday afternoon with abdominal pain which had began the previous morning. He was vomiting nonbloody material. Was unable to keep anything on his stomach without vomiting. Stools performed. Temperature was 100.7. Systolic blood pressure was 80 in the emergency room but improved following IV fluids. CT scan of abdomen and pelvis with contrast 08/18/2014 suggested findings highly suspicious for acute cholecystitis, fatty liver, small fat-containing umbilical hernia, calcific changes of the aorta and coronary vessels with common iliac artery aneurysms, bilateral femoral head avascular necrosis.  Biliary tree, liver and pancreas show no pathology. Abdominal ultrasound  08/19/14 shows cholelithiasis, GB sludge, mild GB wall thickening and distention. CBD is 6.3 mm which is upper limits of normal. Mild intrahepatic duct dilatation. No choledocholithiasis seen.   Dr. [name] of general surgery evaluated the patient and has ordered placement of percutaneous cholecystostomy tube.   This should get done by this afternoon.  Initial AST/ALT 34/44, today these are 459/324. Alkaline phosphatase has gone from 146 to 219.  T bili went from 1.4 - 3.1.  Lipase on admission 20, has not been repeated. History prominence are elevated but he has nonspecific EKG changes. Cardiology has attributed this due to sepsis related demand ischemia.. No formal consultation with cardiology required.  Prior to recent days events, the patient had no GI issues according to his wife.     Past Medical History  Diagnosis Date  . Hyperlipidemia   . Cardiomyopathy, ischemic     EF 45% per ECHO 2008  //   EF 25%, echo, August, 2013 //  Echo (8/15):  Mild LVH, EF 30-35%, ant-lat and lat AK, inf HK, Gr 1 DD, mild MR, mild LAE  . Pulmonary hypertension     moderate ECHO Jan 2008  . Systolic heart failure   . Multiple sclerosis Delta -- LAST VISIT 11-20-2010  NOTE W/ CHART  . Lung nodule     resolved 11-2006 CT Chest  . Tobacco abuse     quit   .  Increased prostate specific antigen (PSA) velocity   . Urinary retention     dx ~ 2-12, like from [place], now with a catheter, saw urology  . Carotid artery disease     a.  Doppler, February, 2012, 0-39% bilateral,Mild smooth plaque;  b.  Carotid US (8/15):  Bilateral 1-39% ICA >>> F/u 2 years  . Chronic indwelling Foley catheter   . Scoliosis associated with other condition   . Hemiparesis   . Fatigue SEVERE  . Impotence   . MI, acute, non ST segment elevation 2007    S/P PCI WITH X1 STENT (TAXUS DRUG-ELUTING) LEFT CIRCUMFLEX  . Diabetes mellitus     INSULIN-DEPENDANT  . Hypertension   . H/O pleural effusion 2008     POST THORACENTESIS  . History of colon polyps PRECANCEROUS  . Insomnia   . CAD (coronary artery disease) CARDIOLOGIST- DR KATZ-- VISIT 06-05-2011 IN [facility]    non-STEMI, 2007.Marland Kitchenoccluded circumflex.. Taxus stent placed...residual 80% LAD...50% RCA  . Urinary tract infection     hx of  . Atherosclerotic PVD with ulceration     left foot  . PAC (premature atrial contraction)     December, 2013    Past Surgical History  Procedure Laterality Date  . Hernia repair  1990    (R)  . Thoracentesis  2008    PLEURAL EFFUSION  . Cystoscopy  07/30/2011    Procedure: CYSTOSCOPY;  Surgeon: Hanley Ben, MD;  Location: [facility];  Service: Urology;  Laterality: N/A;  . Transurethral resection of prostate  07/30/2011    Procedure: TRANSURETHRAL RESECTION OF THE PROSTATE (TURP);  Surgeon: Hanley Ben, MD;  Location: [facility];  Service: Urology;  Laterality: N/A;  . Femoral-tibial bypass graft  03/11/2012    Procedure: BYPASS GRAFT FEMORAL-TIBIAL ARTERY;  Surgeon: Conrad Eunice, MD;  Location: [place];  Service: Vascular;  Laterality: Left;  Left Femoral -Tibial trunk bypass, Endarterectomy of Tibial- Peroneal trunk with vein angioplasty.  . Intraoperative arteriogram  03/11/2012    Procedure: INTRA OPERATIVE ARTERIOGRAM;  Surgeon: Conrad Lykens, MD;  Location: [place];  Service: Vascular;  Laterality: Left;  . Femoral-popliteal bypass graft  03/11/2012    Procedure: BYPASS GRAFT FEMORAL-POPLITEAL ARTERY;  Surgeon: Conrad Hicksville, MD;  Location: [place];  Service: Vascular;  Laterality: Left;  embolectomy left lower leg  . Amputation  03/17/2012    Procedure: AMPUTATION ABOVE KNEE;  Surgeon: Conrad Napa, MD;  Location: [place];  Service: Vascular;  Laterality: Left;  . Coronary angioplasty with stent placement  05-01-2006    OCCLUDED CIRCUMFLEX -- TAXUS STENT PLACMENT  AND RESIDUAL 80% LAD,  50% RCA  . Amputation  06/03/2012    Procedure: AMPUTATION ABOVE KNEE;  Surgeon: Conrad Hagerman, MD;  Location: [place];  Service: Vascular;  Laterality: Right;  . Lower extremity angiogram Bilateral 12/17/2011    Procedure: LOWER EXTREMITY ANGIOGRAM;  Surgeon: Serafina Mitchell, MD;  Location: [facility] CATH LAB;  Service: Cardiovascular;  Laterality: Bilateral;  bil lower extrem angio  . Abdominal angiogram  12/17/2011    Procedure: ABDOMINAL ANGIOGRAM;  Surgeon: Serafina Mitchell, MD;  Location: [facility] CATH LAB;  Service: Cardiovascular;;  . Abdominal aortagram N/A 08/19/2013    Procedure: ABDOMINAL Maxcine Ham;  Surgeon: Conrad La Grande, MD;  Location: [facility] CATH LAB;  Service: Cardiovascular;  Laterality: N/A;    Prior to Admission medications   Medication Sig Start Date End Date Taking? Authorizing Provider  acetaminophen-codeine (TYLENOL #  4) 300-60 MG per tablet Take 1 tablet by mouth 3 (three) times daily. Patient taking differently: Take 1 tablet by mouth 2 (two) times daily as needed for moderate pain or severe pain.  05/31/14  Yes Charlett Blake, MD  aspirin EC 325 MG tablet Take 325 mg by mouth daily.   Yes Historical Provider, MD  atorvastatin (LIPITOR) 40 MG tablet TAKE 1 TABLET (40 MG TOTAL) BY MOUTH AT BEDTIME.   Yes Colon Branch, MD  baclofen (LIORESAL) 10 MG tablet TAKE 0.5 TABLETS (5 MG TOTAL) BY MOUTH 2 (TWO) TIMES DAILY 05/31/14  Yes Charlett Blake, MD  benazepril (LOTENSIN) 10 MG tablet TAKE 1 TABLET BY MOUTH EVERY DAY   Yes Colon Branch, MD  diclofenac sodium (VOLTAREN) 1 % GEL Apply 1 application topically 2 (two) times daily as needed (pain). For pain 03/09/12  Yes Charlett Blake, MD  insulin aspart protamine- aspart (NOVOLOG MIX 70/30) (70-30) 100 UNIT/ML injection Inject 15-45 Units into the skin 2 (two) times daily with a meal. Take 45 units in the morning with breakfast and take 15 units at supper   Yes Historical Provider, MD  metoprolol succinate (TOPROL-XL) 25 MG 24 hr tablet TAKE 1.5 TABLETS (37.5 MG TOTAL) BY MOUTH DAILY.   Yes Colon Branch, MD  pantoprazole (PROTONIX) 40 MG  tablet TAKE 1 TABLET BY MOUTH EVERY DAY AT 12 NOON 07/25/14  Yes Colon Branch, MD  gabapentin (NEURONTIN) 300 MG capsule Take 2 capsules (600 mg total) by mouth 3 (three) times daily. Patient not taking: Reported on 08/18/2014 02/11/14   Dudley Major, DO  glucagon (GLUCAGEN) 1 MG SOLR injection Inject 1 mg into the vein once as needed for low blood sugar.    Historical Provider, MD  mupirocin ointment (BACTROBAN) 2 % Apply 1 application topically 2 (two) times daily. Patient not taking: Reported on 08/18/2014 05/25/14   Colon Branch, MD    Scheduled Meds: . aspirin  81 mg Oral Daily  . baclofen  5 mg Oral BID  . heparin  5,000 Units Subcutaneous 3 times per day  . insulin aspart  0-9 Units Subcutaneous Q4H  . pantoprazole (PROTONIX) IV  40 mg Intravenous Q24H  . piperacillin-tazobactam (ZOSYN)  IV  3.375 g Intravenous Q8H   Infusions: . sodium chloride Stopped (08/19/14 1154)   PRN Meds: acetaminophen-codeine, alum & mag hydroxide-simeth, morphine injection, [DISCONTINUED] ondansetron **OR** ondansetron (ZOFRAN) IV, sodium chloride   Allergies as of 08/18/2014 - Review Complete 08/18/2014  Allergen Reaction Noted  . Bee venom Anaphylaxis 12/11/2011  . Influenza vaccines Other (See Comments) 05/25/2014    Family History  Problem Relation Age of Onset  . Hypertension    . Heart attack Mother 54  . Heart disease Mother   . Stroke Mother   . Hyperlipidemia Mother   . Hypertension Mother   . Colon cancer Neg Hx   . Prostate cancer Neg Hx   . COPD Father   . Peripheral vascular disease Father   . Diabetes Brother   . Heart disease Brother   . Hypertension Brother   . Heart attack Brother   . Diabetes Daughter     History   Social History  . Marital Status: Married    Spouse Name: N/A    Number of Children: 2  . Years of Education: N/A   Occupational History  . disable    Social History Main Topics  . Smoking status: Former Smoker -- 70  years    Quit date: 07/22/2012    . Smokeless tobacco: Former Systems developer    Quit date: 08/05/2009     Comment: pt states that he is using E-cigs  . Alcohol Use: No  . Drug Use: No  . Sexual Activity: No     Comment: electronic cigarettes   Other Topics Concern  . Not on file   Social History Narrative   Lives w/ wife    REVIEW OF SYSTEMS: Constitutional:   Weight is stable. Requires Hoyer lift for transfer from bed to wheelchair. ENT:  No nose bleeds Pulm:   No shortness of breath, no cough CV:  No palpitations, no LE edema.  GU:  No hematuria, no frequency GI:  Per HPI Heme:   No issues with unusually excessive bleeding or bruising.   Transfusions:   None Neuro:  No headaches, no peripheral tingling or numbness.  No confusion or mental status changes  Derm:  No itching, no rash or sores.  Endocrine:  No sweats or chills.  No polyuria or dysuria.  Sugars at home normally in low to mid 100s, into 200s yesterday PTA as he was becoming ill.  Immunization:   Pneumovax 10/2013, do not see a documented flu shot. Travel:  None beyond local counties in last few months.    PHYSICAL EXAM: Vital signs in last 24 hours: Filed Vitals:   08/19/14 1204  BP: 140/61  Pulse: 102  Temp: 100.9 F (38.3 C)  Resp: 25   Wt Readings from Last 3 Encounters:  08/18/14 146 lb 14.4 oz (66.633 kg)  08/19/13 148 lb (67.132 kg)  08/13/13 148 lb (67.132 kg)    General:  pleasant, nontoxic but sleepy AAM. He is comfortable Head:   No facial asymmetry or swelling.  Eyes:   No scleral icterus. No conjunctival pallor Ears:   Hearing intact  Nose:   No discharge Mouth:   Clear, moist. Most of his teeth are missing. No lesions or exudates Neck:   No JVD, no bruits, no TMG, no masses Lungs:   Clear bilaterally to auscultation. No cough, no wet vocal quality, no dyspnea Heart:  RRR. No MRG. S1/S2 audible Abdomen:   Soft, NT, ND.   Bowel sounds hypoactive. No tinkling or tympanitic bowel sounds. No masses. No organomegaly Rectal:   deferred   Musc/Skeltl:  no joint swelling, contractures or erythema. Status post bilateral AKAs Extremities:   No upper extremity edema  Neurologic:   Oriented 3. Sleepy but easily aroused. No tremor, no weakness of the upper extremities. Skin:   No rash, no telangiectasia, no jaundice Tattoos:   None seen Nodes:   No cervical adenopathy   Psych:   Pleasant, cooperative. Demeanor subdued.  Intake/Output from previous day: 01/28 0701 - 01/29 0700 In: 3250 [I.V.:3000; IV Piggyback:250] Out: 800 [Urine:800] Intake/Output this shift:    LAB RESULTS:  Recent Labs  08/18/14 1250 08/18/14 1340 08/19/14 0643  WBC 19.9*  --  18.0*  HGB 16.1 18.7* 14.6  HCT 47.4 55.0* 42.7  PLT 256  --  205   BMET Lab Results  Component Value Date   NA 138 08/19/2014   NA 135 08/18/2014   NA 135 08/18/2014   K 3.7 08/19/2014   K 4.3 08/18/2014   K 4.3 08/18/2014   CL 107 08/19/2014   CL 103 08/18/2014   CL 102 08/18/2014   CO2 22 08/19/2014   CO2 18* 08/18/2014   CO2 23 02/21/2014   GLUCOSE 72  08/19/2014   GLUCOSE 318* 08/18/2014   GLUCOSE 311* 08/18/2014   BUN 17 08/19/2014   BUN 26* 08/18/2014   BUN 21 08/18/2014   CREATININE 0.76 08/19/2014   CREATININE 1.10 08/18/2014   CREATININE 1.25 08/18/2014   CALCIUM 8.3* 08/19/2014   CALCIUM 9.2 08/18/2014   CALCIUM 8.8 02/21/2014   LFT  Recent Labs  08/18/14 1250 08/19/14 0643  PROT 8.1 6.9  ALBUMIN 3.4* 2.8*  AST 34 459*  ALT 44 324*  ALKPHOS 146* 219*  BILITOT 1.4* 3.1*   PT/INR Lab Results  Component Value Date   INR 1.45 08/19/2014   INR 0.98 06/02/2012   INR 1.04 03/10/2012   Hepatitis Panel No results for input(s): HEPBSAG, HCVAB, HEPAIGM, HEPBIGM in the last 72 hours. C-Diff No components found for: CDIFF Lipase     Component Value Date/Time   LIPASE 20 08/18/2014 1250    Drugs of Abuse  No results found for: LABOPIA, COCAINSCRNUR, LABBENZ, AMPHETMU, THCU, LABBARB   RADIOLOGY STUDIES: Ct Abdomen  Pelvis W Contrast  08/18/2014   CLINICAL DATA:  Periumbilical and right upper quadrant pain for 2 days. Elevated white blood cell count and elevated lactic acid.  EXAM: CT ABDOMEN AND PELVIS WITH CONTRAST  TECHNIQUE: Multidetector CT imaging of the abdomen and pelvis was performed using the standard protocol following bolus administration of intravenous contrast.  CONTRAST:  100 mL OMNIPAQUE IOHEXOL 300 MG/ML  SOLN  COMPARISON:  CT abdomen and pelvis 10/09/2010.  FINDINGS: Mild dependent atelectasis is present in the lung bases. Heart size is enlarged. There is calcific coronary artery disease.  The gallbladder is markedly dilated with wall thickening and surrounding inflammatory change. The liver is diffusely low attenuating with a 1.4 cm cyst on image 15 which is unchanged in appearance. The biliary tree, adrenal glands, spleen and pancreas appear normal. Bilateral renal cysts are noted.  The patient has aortoiliac atherosclerosis. The right common iliac artery is dilated at 1.8 cm. The left common iliac artery measures 1.5 cm. There is a large volume of stool in the cecum. The colon is otherwise unremarkable. The stomach and small bowel appear normal. No lymphadenopathy is identified. Small fat containing umbilical hernia is noted.  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 No other focal bony lesion is identified.  IMPRESSION: Findings highly suspicious for acute cholecystitis. Right upper quadrant ultrasound could be used for further evaluation.  Fatty infiltration of the liver.  Calcific aortic and coronary atherosclerosis. 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  Small fat containing umbilical hernia.  Avascular necrosis of the femoral heads bilaterally.   Electronically Signed   By: Inge Rise M.D.   On: 08/18/2014 16:23   Dg Chest Portable 1 View  08/18/2014   CLINICAL DATA:  Initial encounter for  abdominal pain.  Ex-smoker.  EXAM: PORTABLE CHEST - 1 VIEW  COMPARISON:  12/16/2013  FINDINGS: Numerous leads and wires project over the chest. Midline trachea. Moderate cardiomegaly. No pleural effusion or pneumothorax. mild subsegmental atelectasis at the left lung base. No gross free intraperitoneal air. Convex right thoracolumbar spine curvature.  IMPRESSION: Cardiomegaly, without acute disease.   Electronically Signed   By: Abigail Miyamoto M.D.   On: 08/18/2014 13:58   US Abdomen Limited Ruq  08/19/2014   CLINICAL DATA:  Right upper quadrant abdominal pain.  EXAM: US ABDOMEN LIMITED - RIGHT UPPER QUADRANT  COMPARISON:  None.  FINDINGS: Gallbladder:  Gallbladder is moderately distended. Multiple stones and sludge are demonstrated in the gallbladder. Largest stone measures about 8 mm diameter. Focal area of mild gallbladder wall thickening up to 3.3 mm. Murphy's sign is negative.  Common bile duct:  Diameter: 6.3 mm, upper limits normal. Mild intrahepatic bile duct dilatation is suggested. No filling defects are demonstrated although portions of the bowel duct are obscured by bowel gas.  Liver:  With diffusely increased coarsened liver echotexture likely representing fatty infiltration. No focal lesion definitively identified.  IMPRESSION: Cholelithiasis and gallbladder sludge with mild gallbladder wall thickening and distention. Changes may indicate acute cholecystitis although Murphy's sign is negative. Mild intrahepatic bile duct dilatation of nonspecific etiology.   Electronically Signed   By: Lucienne Capers M.D.   On: 08/19/2014 06:08    ENDOSCOPIC STUDIES: Per HPI  IMPRESSION:   *  Cholecystitis with possible cholangitis.  *  Choledocholithiasis?  On CT scan there is no biliary ductal dilatation, ultrasound appearance of bile duct is upper limits of normal. Neither study demonstrates CBD stones or masses. However LFTs have become acutely elevated  *  Multiple sclerosis. Wheelchair bound due to  this as well as bilateral AKA.  *  Peripheral vascular disease.  *  IDDM.  Sugars acutely elevated.  At home sugars normally well controlled.       PLAN:     *  Discussed case with Dr Corrie Mckusick of IR.  It is unlikely he can safely obtain cholangiogram at time of perc GB drain placement.    Azucena Freed  08/19/2014, 12:26 PM Pager: [phone]     ________________________________________________________________________  [facility] Heckler GI MD note:  I personally examined the patient, reviewed the data and agree with the assessment and plan described above.  He is currently getting perc chole drain placed.  After 2-3 days will be possible to use the drain to perform cholangiogram to interrogate for CBD stones, pathology.  Will follow along.   Owens Loffler, MD [facility] Pager [phone]

## 2014-08-25 NOTE — Anesthesia Preprocedure Evaluation (Signed)
Anesthesia Evaluation  Patient identified by MRN, date of birth, ID band Patient awake    Reviewed: Allergy & Precautions, NPO status , Patient's Chart, lab work & pertinent test results  Airway Mallampati: II  TM Distance: >3 FB Neck ROM: Full    Dental   Pulmonary shortness of breath, former smoker,          Cardiovascular hypertension, + CAD, + Past MI, + Peripheral Vascular Disease and +CHF     Neuro/Psych    GI/Hepatic negative GI ROS, Neg liver ROS,   Endo/Other  diabetes  Renal/GU negative Renal ROS     Musculoskeletal   Abdominal   Peds  Hematology   Anesthesia Other Findings   Reproductive/Obstetrics                             Anesthesia Physical Anesthesia Plan  ASA: III  Anesthesia Plan: General   Post-op Pain Management:    Induction: Intravenous  Airway Management Planned: Oral ETT  Additional Equipment:   Intra-op Plan:   Post-operative Plan: Extubation in OR  Informed Consent: I have reviewed the patients History and Physical, chart, labs and discussed the procedure including the risks, benefits and alternatives for the proposed anesthesia with the patient or authorized representative who has indicated his/her understanding and acceptance.   Dental advisory given  Plan Discussed with: CRNA and Anesthesiologist  Anesthesia Plan Comments:         Anesthesia Quick Evaluation

## 2014-08-25 NOTE — Clinical Social Work Note (Signed)
Clinical Social Worker continuing to follow patient and family for support and discharge planning needs.  Per CM, patient has been accepted to [facility] and per MD patient is able to discharge tomorrow.  CM to address patient discharge needs to [facility],  [place] available to provide ambulance transportation if needed at discharge.    Barbette Or, [place]

## 2014-08-25 NOTE — Care Management Note (Signed)
    Page 1 of 1   08/25/2014     8:35:40 AM CARE MANAGEMENT NOTE 08/25/2014  Patient:  Jaime [name], Jaime [name]   Account Number:  [id number]  Date Initiated:  08/22/2014  Documentation initiated by:  MAYO,HENRIETTA  Subjective/Objective Assessment:   pt has MS, now dx with sepsis; lives with spouse, has electric w/c, lift    PCP  [name]     Action/Plan:   Anticipated DC Date:  08/26/2014   Anticipated DC Plan:  [facility] ([facility])  In-house referral  Clinical Social Worker      [place]  CM consult      Choice offered to / List presented to:             Status of service:  In process, will continue to follow Medicare Important Message given?  YES (If response is "NO", the following Medicare IM given date fields will be blank) Date Medicare IM given:  08/22/2014 Medicare IM given by:  MAYO,HENRIETTA Date Additional Medicare IM given:   Additional Medicare IM given by:    Discharge Disposition:    Per UR Regulation:  Reviewed for med. necessity/level of care/duration of stay  If discussed at [place] of Stay Meetings, dates discussed:    Comments:  08/24/14 [place], BSN [phone] NCM spoke with patient's wife, she states she would like for patient to go to [facility] ([facility]) because it is close to her family's home.  NCM informed Seth Bake with [facility] and Konrad Dolores with [facility] and MD.  Patient is for ERCP today, for possible dc as early as today or tomrrow per MD to [facility]. Seth Bake has authorization for [facility] which last until Friday.  08/22/14 1038 [place] MSN BSN CCM Physical therapist talked with spouse after evaluation, she and spouse agreed that pt needs ST-SNF for rehab before returning home.

## 2014-08-25 NOTE — Op Note (Signed)
[facility] [place], [address]   ERCP PROCEDURE REPORT        EXAM DATE: 08/25/2014  PATIENT NAME:          Jaime [name], Jaime [name]          MR #: [id number] BIRTHDATE:       05-Jun-1941     VISIT #:     [id number] ATTENDING:     Ladene Artist, MD, [facility]     STATUS:     inpatient  ASSISTANT:      Elspeth Cho, [place], [place], and Cleda Daub   INDICATIONS:  The patient is a 74 yr old male here for an ERCP due to established bile duct stone(s). PROCEDURE PERFORMED:     ERCP with sphincterotomy/papillotomy ERCP with removal of calculus/calculi MEDICATIONS:     Per Anesthesia  CONSENT: The patient understands the risks and benefits of the procedure and understands that these risks include, but are not limited to: sedation, allergic reaction, infection, perforation and/or bleeding. Alternative means of evaluation and treatment include, among others: physical exam, x-rays, and/or surgical intervention. The patient elects to proceed with this endoscopic procedure.  DESCRIPTION OF PROCEDURE: During intra-op preparation period all mechanical & medical equipment was checked for proper function. Hand hygiene and appropriate measures for infection prevention was taken. After the risks, benefits and alternatives of the procedure were thoroughly explained, Informed was verified, confirmed and timeout was successfully executed by the treatment team. With the patient in left semi-prone position, medications were administered intravenously.The    was passed from the mouth into the esophagus and further advanced from the esophagus into the stomach. From stomach scope was directed to the second portion of the duodenum. Major papilla was aligned with the duodenoscope. The scope position was confirmed fluoroscopically. Rest of the findings/therapeutics are given below. The scope was then completely withdrawn from the patient and the procedure  completed. The pulse, BP, and O2 saturation were monitored and documented by the physician and the nursing staff throughout the entire procedure. The patient was cared for as planned according to standard protocol. The patient was then discharged to recovery in stable condition and with appropriate post procedure care.  The ampulla was located the second portion of the duodenum.  The ampulla appeared normal. The CBD was cannulated by guidewire and then filled with contrast.  Two small stones were seen in the mid common bile duct.  Cholecystostomy tube noted in the gallbladder. The biliary tree was otherwise unremarkable.  With guidewire in the bile duct, a biliary sphincterotomy was performed using the sphincterotome.   Using a stone extraction balloon the bile duct was swept three times.  Two stones were removed from the bile duct successfully. The CBD appeared free of filling defects and there was good biliary drainage noted.    ADVERSE EVENT:     There were no complications. IMPRESSIONS:     1.  Normal appearing ampulla; biliary sphincterotomy performed 2.  CBD stones removed using balloon extraction  RECOMMENDATIONS:     1.   Continue antibiotics 2.  Observation  post ERCP    Ladene Artist, MD, [facility] eSigned:  Ladene Artist, MD, [facility] 08/25/2014 3:27 PM

## 2014-08-25 NOTE — Progress Notes (Signed)
PROGRESS NOTE  Jaime [name] VCB:[id number] DOB: 06/02/1941 DOA: 08/18/2014 PCP: [name], MD  Assessment/Plan: 1. Sepsis with lactic acidosis due to acute cholecystitis.  -Gen. surgery and GI following, post percutaneous gallbladder drain placement by IR on 08/19/2014 as he is deemed to be a poor operative candidate by surgery. Was on Empiric IV Zosyn from 08-18-14 to 08-23-2014, switched to Augmentin on 08/23/2014 based on cultures .  -Liver enzymes trending down. -Plan Augmentin through 08/30/2014 -Per general surgery, cholecystotomy tube will need to stay in place for 6 weeks--surgery which is IR to follow up on the chole drain   2. Choledocholithiasis -Cholangiogram on 08/22/2014 showed obstructing stones in the cystic duct -08/24/2014 MRCP showed choledocholithiasis without rehabilitation -08/25/14--ERCP--normal appearing ampulla, biliary sphincterotomy, common bile duct stones extracted -appreciate GI  3. Hypotension.  -Due to #1 above. Blood pressure is better holding IV fluids now. -resolved   4. DM type II.  -on low dose sliding scale and monitor. -08/19/2014 hemoglobin A1c 8.9  -CBGs controlled -plan d/c on lower dose 70/30  5. Chronic Systolic CHF -compensated -08/19/2014 after EF 45-50 percent, diffuse HK, no change vs Aug 2015  6. UTI -proteus -continue augmentin  7. Elevated troponin -demand ischemia -normalized -no CP  Family Communication:   Wife updated at beside Disposition Plan:   LTAC 08/26/14 if ok with GI          Procedures/Studies: Ct Abdomen Pelvis W Contrast  08/18/2014   CLINICAL DATA:  Periumbilical and right upper quadrant pain for 2 days. Elevated white blood cell count and elevated lactic acid.  EXAM: CT ABDOMEN AND PELVIS WITH CONTRAST  TECHNIQUE: Multidetector CT imaging of the abdomen and pelvis was performed using the standard protocol following bolus administration of intravenous contrast.  CONTRAST:  100 mL OMNIPAQUE  IOHEXOL 300 MG/ML  SOLN  COMPARISON:  CT abdomen and pelvis 10/09/2010.  FINDINGS: Mild dependent atelectasis is present in the lung bases. Heart size is enlarged. There is calcific coronary artery disease.  The gallbladder is markedly dilated with wall thickening and surrounding inflammatory change. The liver is diffusely low attenuating with a 1.4 cm cyst on image 15 which is unchanged in appearance. The biliary tree, adrenal glands, spleen and pancreas appear normal. Bilateral renal cysts are noted.  The patient has aortoiliac atherosclerosis. The right common iliac artery is dilated at 1.8 cm. The left common iliac artery measures 1.5 cm. There is a large volume of stool in the cecum. The colon is otherwise unremarkable. The stomach and small bowel appear normal. No lymphadenopathy is identified. Small fat containing umbilical hernia is noted.  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 No other focal bony lesion is identified.  IMPRESSION: Findings highly suspicious for acute cholecystitis. Right upper quadrant ultrasound could be used for further evaluation.  Fatty infiltration of the liver.  Calcific aortic and coronary atherosclerosis. 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  Small fat containing umbilical hernia.  Avascular necrosis of the femoral heads bilaterally.   Electronically Signed   By: Inge Rise M.D.   On: 08/18/2014 16:23   Ir Perc Cholecystostomy  08/19/2014   INDICATION: 74 year old male with a history of acute cholecystitis. He has been referred for evaluation for cholecystostomy placement.  EXAM: ULTRASOUND AND FLUOROSCOPIC-GUIDED CHOLECYSTOSTOMY TUBE PLACEMENT  COMPARISON:  None.  MEDICATIONS: Fentanyl 125 mcg IV; Versed  0 mg IV; The patient is currently admitted to the hospital and on intravenous antibiotics. 500 mg of Levaquin were administered periprocedure.  25 mg Demerol IV   ANESTHESIA/SEDATION: Total Moderate Sedation Time  Zero minutes  CONTRAST:  43mL OMNIPAQUE IOHEXOL 300 MG/ML  SOLN  FLUOROSCOPY TIME:  minutes  COMPLICATIONS: None immediate.  PROCEDURE: Informed written consent was obtained from the patient after a discussion of the risks, benefits and alternatives to treatment. Questions regarding the procedure were encouraged and answered. A timeout was performed prior to the initiation of the procedure.  The right upper abdominal quadrant was prepped and draped in the usual sterile fashion, and a sterile drape was applied covering the operative field. Maximum barrier sterile technique with sterile gowns and gloves were used for the procedure. A timeout was performed prior to the initiation of the procedure. Local anesthesia was provided with 1% lidocaine with epinephrine.  Ultrasound scanning of the right upper quadrant demonstrates a markedly dilated gallbladder. Utilizing a transhepatic approach, a 22 gauge needle was advanced into the gallbladder under direct ultrasound guidance. An ultrasound image was saved for documentation purposes. Appropriate intraluminal puncture was confirmed with the efflux of bile and advancement of an 0.018 wire into the gallbladder lumen. The needle was exchanged for an [place] set. A small amount of contrast was injected to confirm appropriate intraluminal positioning. Over a Benson wire, a 87.2-French Cook cholecystomy tube was advanced into the gallbladder fossa, coiled and locked. Bile was aspirated and a small amount of contrast was injected as several post procedural spot radiographic images were obtained in various obliquities. The catheter was secured to the skin with suture, connected to a drainage bag and a dressing was placed.  The patient tolerated the procedure well without immediate post procedural complication.  IMPRESSION: Status post percutaneous cholecystostomy tube placement. A sample of fluid was sent to the lab for analysis.   Signed,  Dulcy Fanny. Earleen Newport, DO  [facility]  [facility]   Electronically Signed   By: Corrie Mckusick D.O.   On: 08/19/2014 16:18   Dg Chest Port 1 View  08/21/2014   CLINICAL DATA:  74 year old male with shortness breath.  EXAM: PORTABLE CHEST - 1 VIEW  COMPARISON:  Chest x-ray 08/18/2014.  FINDINGS: Lung volumes are low. No consolidative airspace disease. No pleural effusions. No evidence of pulmonary edema. Heart size is mildly enlarged. Upper mediastinal contours are distorted by patient positioning, but are similar to prior examinations.  IMPRESSION: 1. No radiographic evidence of acute cardiopulmonary disease. 2. Mild cardiomegaly.   Electronically Signed   By: Vinnie Langton M.D.   On: 08/21/2014 15:56   Dg Chest Portable 1 View  08/18/2014   CLINICAL DATA:  Initial encounter for abdominal pain.  Ex-smoker.  EXAM: PORTABLE CHEST - 1 VIEW  COMPARISON:  12/16/2013  FINDINGS: Numerous leads and wires project over the chest. Midline trachea. Moderate cardiomegaly. No pleural effusion or pneumothorax. mild subsegmental atelectasis at the left lung base. No gross free intraperitoneal air. Convex right thoracolumbar spine curvature.  IMPRESSION: Cardiomegaly, without acute disease.   Electronically Signed   By: Abigail Miyamoto M.D.   On: 08/18/2014 13:58   Dg Ercp Biliary & Pancreatic Ducts  08/25/2014   CLINICAL DATA:  Choledocholithiasis, calculous cholecystitis, status post percutaneous cholecystostomy  EXAM: ERCP with sphincterotomy/papillotomy and stone removal.  TECHNIQUE: Multiple spot images obtained with the fluoroscopic device and submitted for interpretation post-procedure.  COMPARISON:  08/24/2014, 08/22/2014  FINDINGS: Spot fluoroscopic  views during the procedure demonstrate catheter and guidewire access of the common bile duct. Cholangiogram performed. Diffuse biliary dilatation noted. Filling defects in the distal duct compatible with  choledocholithiasis. Balloon sweep performed for stone removal. Sphincterotomy also performed apparently. Please refer to the ERCP for details of the procedure.  IMPRESSION: Choledocholithiasis and mild biliary dilatation by ERCP.  These images were submitted for radiologic interpretation only. Please see the procedural report for the amount of contrast and the fluoroscopy time utilized.   Electronically Signed   By: Daryll Brod M.D.   On: 08/25/2014 16:29   Mr Abd W/wo Cm/mrcp  08/24/2014   CLINICAL DATA:  Acute cholecystitis with sepsis. Status post percutaneous cholecystostomy.  EXAM: MRI ABDOMEN WITHOUT AND WITH CONTRAST (INCLUDING MRCP)  TECHNIQUE: Multiplanar multisequence MR imaging of the abdomen was performed both before and after the administration of intravenous contrast. Heavily T2-weighted images of the biliary and pancreatic ducts were obtained, and three-dimensional MRCP images were rendered by post processing.  CONTRAST:  60mL MULTIHANCE GADOBENATE DIMEGLUMINE 529 MG/ML IV SOLN  COMPARISON:  CT scan 08/18/2016 and ultrasound 08/19/2014  FINDINGS: Lower chest: There are small bilateral pleural effusions, right greater than left with overlying atelectasis. The heart is mildly enlarged. No pericardial effusion.  Hepatobiliary: There is a simple appearing nonenhancing 14 mm cyst in segment 2. There is also a the enhancing lesion in segment 6 which measures 14 mm. This has typical MR imaging features of a benign hemangioma. No worrisome hepatic lesions or intrahepatic biliary dilatation. Borderline common bile duct dilatation at 7.5 mm. There are multiple small common bile duct stones. A cholecystostomy drainage catheter is noted in the gallbladder which is filled with stones and demonstrates wall thickening and pericholecystic fluid.  Pancreas: Moderate atrophy of the pancreatic tail. No inflammatory changes or mass lesions. Normal pancreatic duct.  Spleen: Normal size.  No focal lesions.   Adrenals/Urinary Tract: The adrenal glands are unremarkable. There are multiple bilateral renal cysts but no worrisome renal lesions or hydronephrosis.  Stomach/Bowel: The stomach, duodenum, visualized small bowel and visualized colon are grossly normal.  Vascular/Lymphatic: No mesenteric or retroperitoneal mass or adenopathy. The aorta and branch vessels are patent. Advanced distal aortic atherosclerotic disease better demonstrated on the CT scan. The major venous structures are patent.  Musculoskeletal: No significant bony findings. Significant scoliosis.  IMPRESSION: 1. Stone filled gallbladder containing a cholecystostomy drainage catheter. There is mild wall thickening and a small amount of pericholecystic fluid. 2. Several small common bile duct stones. No common bile duct dilatation. 3. Benign hepatic lesions including small cysts and a hemangioma. 4. Bilateral pleural effusions and bibasilar atelectasis. 5. Multiple simple appearing renal cysts.   Electronically Signed   By: Kalman Jewels M.D.   On: 08/24/2014 10:16   Ir Cholangiogram Existing Tube  08/22/2014   CLINICAL DATA:  Calculus cholecystitis, status post percutaneous transhepatic cholecystostomy abdomen  EXAM: CHOLANGIOGRAM VIA EXISTING CATHETER  COMPARISON:  08/19/2014, 08/18/2014  FINDINGS: Cholangiogram performed through the existing cholecystostomy. Numerous filling defects within the gallbladder compatible with cholelithiasis. Small filling defects demonstrated within the proximal cystic duct compatible with cystic duct stones. Contrast does not reach the common bile duct or duodenum.  IMPRESSION: Cholelithiasis with obstructing cystic duct stones.   Electronically Signed   By: Daryll Brod M.D.   On: 08/22/2014 12:12   US Abdomen Limited Ruq  08/19/2014   CLINICAL DATA:  Right upper quadrant abdominal pain.  EXAM: US ABDOMEN LIMITED - RIGHT UPPER QUADRANT  COMPARISON:  None.  FINDINGS: Gallbladder:  Gallbladder is moderately distended.  Multiple stones and sludge are demonstrated in the gallbladder. Largest stone measures about 8 mm diameter. Focal area of mild gallbladder wall thickening up to 3.3 mm. Murphy's sign is negative.  Common bile duct:  Diameter: 6.3 mm, upper limits normal. Mild intrahepatic bile duct dilatation is suggested. No filling defects are demonstrated although portions of the bowel duct are obscured by bowel gas.  Liver:  With diffusely increased coarsened liver echotexture likely representing fatty infiltration. No focal lesion definitively identified.  IMPRESSION: Cholelithiasis and gallbladder sludge with mild gallbladder wall thickening and distention. Changes may indicate acute cholecystitis although Murphy's sign is negative. Mild intrahepatic bile duct dilatation of nonspecific etiology.   Electronically Signed   By: Lucienne Capers M.D.   On: 08/19/2014 06:08         Subjective:  patient states that abdominal pain has improved. Denies fevers, chills, chest pain, shortness breath, vomiting, diarrhea, abdominal pain.  Objective: Filed Vitals:   08/25/14 1540 08/25/14 1550 08/25/14 1600 08/25/14 1610  BP: 160/80  160/80 138/82  Pulse: 85 83 79 82  Temp:      TempSrc:      Resp: 15 13 13 15   Height:      Weight:      SpO2: 100% 100% 95% 96%    Intake/Output Summary (Last 24 hours) at 08/25/14 1917 Last data filed at 08/25/14 1843  Gross per 24 hour  Intake    840 ml  Output    765 ml  Net     75 ml   Weight change:  Exam:   General:  Pt is alert, follows commands appropriately, not in acute distress  HEENT: No icterus, No thrush, No neck mass, [place]/AT  Cardiovascular: RRR, S1/S2, no rubs, no gallops  Respiratory: CTA bilaterally, no wheezing, no crackles, no rhonchi  Abdomen: Soft/+BS, non tender, non distended, no guarding  Extremities: No edema, No lymphangitis, No petechiae, No rashes, no synovitis  Data Reviewed: Basic Metabolic Panel:  Recent Labs Lab 08/21/14 0310  08/22/14 0605 08/23/14 0724 08/24/14 0715 08/25/14 0710  NA 135 136 136 139 140  K 4.7 4.0 3.3* 4.2 3.8  CL 110 110 105 107 108  CO2 21 22 23 26 24   GLUCOSE 132* 116* 124* 149* 108*  BUN 22 18 12 9 12   CREATININE 1.06 0.88 0.79 0.72 0.71  CALCIUM 7.8* 7.7* 8.0* 8.0* 8.3*  MG  --   --  1.6  --   --    Liver Function Tests:  Recent Labs Lab 08/21/14 0310 08/22/14 0605 08/23/14 0724 08/24/14 0715 08/25/14 0710  AST 41* 24 26 34 31  ALT 96* 62* 51 49 40  ALKPHOS 125* 110 113 113 109  BILITOT 1.3* 1.4* 1.0 0.9 0.8  PROT 5.4* 5.7* 5.9* 6.1 6.3  ALBUMIN 2.0* 2.0* 2.2* 2.4* 2.4*   No results for input(s): LIPASE, AMYLASE in the last 168 hours. No results for input(s): AMMONIA in the last 168 hours. CBC:  Recent Labs Lab 08/20/14 0305 08/21/14 0310 08/22/14 0605 08/23/14 0724 08/24/14 0715  WBC 10.2 8.6 8.0 7.5 8.5  HGB 11.4* 12.7* 10.8* 10.8* 11.1*  HCT 34.0* 38.3* 32.6* 32.1* 33.8*  MCV 76.9* 79.3 78.7 75.7* 78.1  PLT 188 211 223 280 294   Cardiac Enzymes:  Recent Labs Lab 08/18/14 2327 08/19/14 0643 08/21/14 1810 08/21/14 2200 08/22/14 0605  TROPONINI 0.06* 0.06* 0.03 0.03 0.03   BNP: Invalid input(s): POCBNP  CBG:  Recent Labs Lab 08/24/14 1239 08/24/14 1646 08/25/14 0752 08/25/14 1205 08/25/14 1706  GLUCAP 163* 193* 101* 103* 140*    Recent Results (from the past 240 hour(s))  Blood Culture (routine x 2)     Status: None   Collection Time: 08/18/14  1:22 PM  Result Value Ref Range Status   Specimen Description BLOOD LEFT ANTECUBITAL  Final   Special Requests BOTTLES DRAWN AEROBIC AND ANAEROBIC 10CC  Final   Culture   Final    NO GROWTH 5 DAYS Performed at [facility]    Report Status 08/24/2014 FINAL  Final  Blood Culture (routine x 2)     Status: None   Collection Time: 08/18/14  1:27 PM  Result Value Ref Range Status   Specimen Description BLOOD HAND LEFT  Final   Special Requests BOTTLES DRAWN AEROBIC ONLY 5CC  Final    Culture   Final    STAPHYLOCOCCUS SPECIES (COAGULASE NEGATIVE) Note: THE SIGNIFICANCE OF ISOLATING THIS ORGANISM FROM A SINGLE SET OF BLOOD CULTURES WHEN MULTIPLE SETS ARE DRAWN IS UNCERTAIN. PLEASE NOTIFY THE MICROBIOLOGY DEPARTMENT WITHIN ONE WEEK IF SPECIATION AND SENSITIVITIES ARE REQUIRED. Note: Gram Stain Report Called to,Read Back By and Verified With: ALEX SOLOMON 08/19/14 1435 BY SMITHERSJ Performed at [facility]    Report Status 08/20/2014 FINAL  Final  Urine culture     Status: None   Collection Time: 08/18/14  1:53 PM  Result Value Ref Range Status   Specimen Description URINE, CATHETERIZED  Final   Special Requests NONE  Final   Colony Count   Final    >=100,000 COLONIES/ML Performed at [facility]    Culture   Final    PROTEUS MIRABILIS Performed at [facility]    Report Status 08/20/2014 FINAL  Final   Organism ID, Bacteria PROTEUS MIRABILIS  Final      Susceptibility   Proteus mirabilis - MIC*    AMPICILLIN <=2 SENSITIVE Sensitive     CEFAZOLIN <=4 SENSITIVE Sensitive     CEFTRIAXONE <=1 SENSITIVE Sensitive     CIPROFLOXACIN <=0.25 SENSITIVE Sensitive     GENTAMICIN <=1 SENSITIVE Sensitive     LEVOFLOXACIN <=0.12 SENSITIVE Sensitive     NITROFURANTOIN 128 RESISTANT Resistant     TOBRAMYCIN <=1 SENSITIVE Sensitive     TRIMETH/SULFA <=20 SENSITIVE Sensitive     PIP/TAZO <=4 SENSITIVE Sensitive     * PROTEUS MIRABILIS  MRSA PCR Screening     Status: None   Collection Time: 08/18/14  6:02 PM  Result Value Ref Range Status   MRSA by PCR NEGATIVE NEGATIVE Final    Comment:        The GeneXpert MRSA Assay (FDA approved for NASAL specimens only), is one component of a comprehensive MRSA colonization surveillance program. It is not intended to diagnose MRSA infection nor to guide or monitor treatment for MRSA infections.   Body fluid culture     Status: None   Collection Time: 08/19/14  4:09 PM  Result Value Ref Range Status    Specimen Description BILE  Final   Special Requests NONE  Final   Gram Stain   Final    NO WBC SEEN MODERATE GRAM POSITIVE COCCI IN CHAINS Performed at [facility]    Culture   Final    ABUNDANT VIRIDANS STREPTOCOCCUS Performed at [facility]    Report Status 08/22/2014 FINAL  Final   Organism ID, Bacteria VIRIDANS STREPTOCOCCUS  Final  Susceptibility   Viridans streptococcus - MIC (ETEST)*    PENICILLIN <=0.125 SENSITIVE Sensitive     * ABUNDANT VIRIDANS STREPTOCOCCUS  Clostridium Difficile by PCR     Status: None   Collection Time: 08/21/14 10:55 AM  Result Value Ref Range Status   C difficile by pcr NEGATIVE NEGATIVE Final     Scheduled Meds: . amoxicillin-clavulanate  1 tablet Oral Q12H  . aspirin  81 mg Oral Daily  . baclofen  5 mg Oral BID  . benazepril  10 mg Oral Daily  . feeding supplement (PRO-STAT SUGAR FREE 64)  30 mL Oral TID WC  . insulin aspart  0-5 Units Subcutaneous QHS  . insulin aspart  0-9 Units Subcutaneous TID WC  . pantoprazole  40 mg Oral Q0600   Continuous Infusions: . sodium chloride 1,000 mL (08/25/14 1004)     Varina Hulon, DO  [facility] Pager [phone]  If 7PM-7AM, please contact night-coverage [email or web address] Password [email or web address] 08/25/2014, 7:17 PM   LOS: 7 days

## 2014-08-26 ENCOUNTER — Encounter (HOSPITAL_COMMUNITY): Payer: Self-pay | Admitting: Gastroenterology

## 2014-08-26 DIAGNOSIS — G3184 Mild cognitive impairment, so stated: Secondary | ICD-10-CM | POA: Diagnosis not present

## 2014-08-26 DIAGNOSIS — E1165 Type 2 diabetes mellitus with hyperglycemia: Secondary | ICD-10-CM | POA: Diagnosis not present

## 2014-08-26 DIAGNOSIS — I272 Other secondary pulmonary hypertension: Secondary | ICD-10-CM | POA: Diagnosis present

## 2014-08-26 DIAGNOSIS — Z91030 Bee allergy status: Secondary | ICD-10-CM | POA: Diagnosis not present

## 2014-08-26 DIAGNOSIS — E119 Type 2 diabetes mellitus without complications: Secondary | ICD-10-CM | POA: Diagnosis present

## 2014-08-26 DIAGNOSIS — K8040 Calculus of bile duct with cholecystitis, unspecified, without obstruction: Secondary | ICD-10-CM

## 2014-08-26 DIAGNOSIS — M6281 Muscle weakness (generalized): Secondary | ICD-10-CM | POA: Diagnosis not present

## 2014-08-26 DIAGNOSIS — Z993 Dependence on wheelchair: Secondary | ICD-10-CM | POA: Diagnosis not present

## 2014-08-26 DIAGNOSIS — I255 Ischemic cardiomyopathy: Secondary | ICD-10-CM | POA: Diagnosis not present

## 2014-08-26 DIAGNOSIS — I517 Cardiomegaly: Secondary | ICD-10-CM | POA: Diagnosis not present

## 2014-08-26 DIAGNOSIS — Z794 Long term (current) use of insulin: Secondary | ICD-10-CM | POA: Diagnosis not present

## 2014-08-26 DIAGNOSIS — Z887 Allergy status to serum and vaccine status: Secondary | ICD-10-CM | POA: Diagnosis not present

## 2014-08-26 DIAGNOSIS — Z89611 Acquired absence of right leg above knee: Secondary | ICD-10-CM | POA: Diagnosis not present

## 2014-08-26 DIAGNOSIS — A419 Sepsis, unspecified organism: Secondary | ICD-10-CM | POA: Diagnosis not present

## 2014-08-26 DIAGNOSIS — N390 Urinary tract infection, site not specified: Secondary | ICD-10-CM | POA: Diagnosis present

## 2014-08-26 DIAGNOSIS — I2510 Atherosclerotic heart disease of native coronary artery without angina pectoris: Secondary | ICD-10-CM | POA: Diagnosis present

## 2014-08-26 DIAGNOSIS — I252 Old myocardial infarction: Secondary | ICD-10-CM | POA: Diagnosis not present

## 2014-08-26 DIAGNOSIS — E785 Hyperlipidemia, unspecified: Secondary | ICD-10-CM | POA: Diagnosis present

## 2014-08-26 DIAGNOSIS — R2689 Other abnormalities of gait and mobility: Secondary | ICD-10-CM | POA: Diagnosis not present

## 2014-08-26 DIAGNOSIS — D638 Anemia in other chronic diseases classified elsewhere: Secondary | ICD-10-CM | POA: Diagnosis present

## 2014-08-26 DIAGNOSIS — K810 Acute cholecystitis: Secondary | ICD-10-CM | POA: Diagnosis not present

## 2014-08-26 DIAGNOSIS — J90 Pleural effusion, not elsewhere classified: Secondary | ICD-10-CM | POA: Diagnosis not present

## 2014-08-26 DIAGNOSIS — G35 Multiple sclerosis: Secondary | ICD-10-CM | POA: Diagnosis present

## 2014-08-26 DIAGNOSIS — Z79899 Other long term (current) drug therapy: Secondary | ICD-10-CM | POA: Diagnosis not present

## 2014-08-26 DIAGNOSIS — K8063 Calculus of gallbladder and bile duct with acute cholecystitis with obstruction: Secondary | ICD-10-CM | POA: Diagnosis not present

## 2014-08-26 DIAGNOSIS — I429 Cardiomyopathy, unspecified: Secondary | ICD-10-CM | POA: Diagnosis present

## 2014-08-26 DIAGNOSIS — E860 Dehydration: Secondary | ICD-10-CM | POA: Diagnosis not present

## 2014-08-26 DIAGNOSIS — Z955 Presence of coronary angioplasty implant and graft: Secondary | ICD-10-CM | POA: Diagnosis not present

## 2014-08-26 DIAGNOSIS — R5381 Other malaise: Secondary | ICD-10-CM | POA: Diagnosis not present

## 2014-08-26 DIAGNOSIS — Z89612 Acquired absence of left leg above knee: Secondary | ICD-10-CM | POA: Diagnosis not present

## 2014-08-26 DIAGNOSIS — D509 Iron deficiency anemia, unspecified: Secondary | ICD-10-CM | POA: Diagnosis not present

## 2014-08-26 DIAGNOSIS — I10 Essential (primary) hypertension: Secondary | ICD-10-CM | POA: Diagnosis not present

## 2014-08-26 DIAGNOSIS — E872 Acidosis: Secondary | ICD-10-CM | POA: Diagnosis not present

## 2014-08-26 DIAGNOSIS — R6520 Severe sepsis without septic shock: Secondary | ICD-10-CM | POA: Diagnosis not present

## 2014-08-26 DIAGNOSIS — K830 Cholangitis: Secondary | ICD-10-CM | POA: Diagnosis not present

## 2014-08-26 DIAGNOSIS — K8000 Calculus of gallbladder with acute cholecystitis without obstruction: Secondary | ICD-10-CM | POA: Diagnosis not present

## 2014-08-26 DIAGNOSIS — Z7982 Long term (current) use of aspirin: Secondary | ICD-10-CM | POA: Diagnosis not present

## 2014-08-26 DIAGNOSIS — G8191 Hemiplegia, unspecified affecting right dominant side: Secondary | ICD-10-CM | POA: Diagnosis present

## 2014-08-26 DIAGNOSIS — R278 Other lack of coordination: Secondary | ICD-10-CM | POA: Diagnosis not present

## 2014-08-26 DIAGNOSIS — B964 Proteus (mirabilis) (morganii) as the cause of diseases classified elsewhere: Secondary | ICD-10-CM | POA: Diagnosis present

## 2014-08-26 DIAGNOSIS — I5023 Acute on chronic systolic (congestive) heart failure: Secondary | ICD-10-CM | POA: Diagnosis not present

## 2014-08-26 DIAGNOSIS — I5022 Chronic systolic (congestive) heart failure: Secondary | ICD-10-CM | POA: Diagnosis present

## 2014-08-26 DIAGNOSIS — Z87891 Personal history of nicotine dependence: Secondary | ICD-10-CM | POA: Diagnosis not present

## 2014-08-26 LAB — COMPREHENSIVE METABOLIC PANEL
ALK PHOS: 95 U/L (ref 39–117)
ALT: 36 U/L (ref 0–53)
ANION GAP: 10 (ref 5–15)
AST: 30 U/L (ref 0–37)
Albumin: 2.2 g/dL — ABNORMAL LOW (ref 3.5–5.2)
BILIRUBIN TOTAL: 0.7 mg/dL (ref 0.3–1.2)
BUN: 9 mg/dL (ref 6–23)
CHLORIDE: 103 mmol/L (ref 96–112)
CO2: 25 mmol/L (ref 19–32)
Calcium: 8 mg/dL — ABNORMAL LOW (ref 8.4–10.5)
Creatinine, Ser: 0.65 mg/dL (ref 0.50–1.35)
GFR calc non Af Amer: >90 mL/min (ref >90)
GLUCOSE: 101 mg/dL — AB (ref 70–99)
POTASSIUM: 3.7 mmol/L (ref 3.5–5.1)
Sodium: 138 mmol/L (ref 135–145)
Total Protein: 6 g/dL (ref 6.0–8.3)

## 2014-08-26 LAB — LIPASE, BLOOD: Lipase: 31 U/L (ref 11–59)

## 2014-08-26 LAB — GLUCOSE, CAPILLARY
Glucose-Capillary: 88 mg/dL (ref 70–99)
Glucose-Capillary: 128 mg/dL — ABNORMAL HIGH (ref 70–99)
Glucose-Capillary: 136 mg/dL — ABNORMAL HIGH (ref 70–99)

## 2014-08-26 MED ORDER — OXYCODONE-ACETAMINOPHEN 5-325 MG PO TABS: 1.0000 | ORAL_TABLET | Freq: Four times a day (QID) | ORAL | Status: DC | PRN

## 2014-08-26 MED ORDER — PRO-STAT SUGAR FREE PO LIQD: 30.0000 mL | Freq: Three times a day (TID) | ORAL | Status: DC

## 2014-08-26 MED ORDER — INSULIN ASPART PROT & ASPART (70-30 MIX) 100 UNIT/ML ~~LOC~~ SUSP
5.0000 [IU] | Freq: Two times a day (BID) | SUBCUTANEOUS | Status: DC
Start: 2014-08-26 — End: 2014-11-03

## 2014-08-26 MED ORDER — AMOXICILLIN-POT CLAVULANATE 875-125 MG PO TABS: 1.0000 | ORAL_TABLET | Freq: Two times a day (BID) | ORAL | Status: DC

## 2014-08-26 MED ORDER — OXYCODONE-ACETAMINOPHEN 5-325 MG PO TABS
1.0000 | ORAL_TABLET | Freq: Four times a day (QID) | ORAL | Status: DC | PRN
  Administered 2014-08-26: 1 via ORAL
  Filled 2014-08-26: qty 1

## 2014-08-26 MED ORDER — METOPROLOL SUCCINATE ER 25 MG PO TB24: 25.0000 mg | ORAL_TABLET | Freq: Every day | ORAL | Status: DC

## 2014-08-26 NOTE — Progress Notes (Signed)
Physical Therapy Treatment Patient Details Name: Jaime [name] MRN: [id number] DOB: 11-16-1940 Today's Date: 08/26/2014    History of Present Illness Pt adm with acute cholecysitits. Percutaneous cholecystostomy tube placed on 08/19/14. PMH - Bil AKA, multiple sclerosis, PVD, cardiomyopathy, heart failure    PT Comments    Pt was seen for evaluation of his current status and is appropriate for follow up in LTAC, but may need further therapy for home.  His wife has been having help to move him at home as he is quite weak and has lost ground from bed to chair with slide transfer.  Will need a trapeze bar in LTAC for follow up to assist with his bed mob.  Follow Up Recommendations  SNF     Equipment Recommendations  None recommended by PT    Recommendations for Other Services       Precautions / Restrictions Precautions Precautions: Fall Restrictions Weight Bearing Restrictions: No    Mobility  Bed Mobility Overal bed mobility: Needs Assistance Bed Mobility: Rolling;Supine to Sit;Sit to Supine Rolling: Min assist;Mod assist (min to R and mod to L)   Supine to sit: Total assist (Pt resists the effort) Sit to supine: Max assist   General bed mobility comments: Using bedrails to assist and needs a trapeze bar to help scoot up and over in bed.  Transfers                 General transfer comment: Total assist of lift  Ambulation/Gait             General Gait Details: unable to stand   Stairs            Wheelchair Mobility    Modified Rankin (Stroke Patients Only)       Balance Overall balance assessment: Needs assistance   Sitting balance-Leahy Scale: Zero Sitting balance - Comments: Pt is a B AK amputee and total assist to support bedside with pt resisting in extension                            Cognition Arousal/Alertness: Awake/alert Behavior During Therapy: WFL for tasks assessed/performed Overall Cognitive Status: History of  cognitive impairments - at baseline       Memory: Decreased short-term memory              Exercises Other Exercises Other Exercises: Strengthening BLE's in hips    General Comments        Pertinent Vitals/Pain Pain Assessment: Faces Pain Score: 5  Pain Location: R abd and surgery site Pain Intervention(s): Limited activity within patient's tolerance;Monitored during session;Repositioned    Home Living                      Prior Function            PT Goals (current goals can now be found in the care plan section) Acute Rehab PT Goals Patient Stated Goal: none stated Progress towards PT goals: Progressing toward goals    Frequency  Min 2X/week    PT Plan Current plan remains appropriate    Co-evaluation             End of Session   Activity Tolerance: Patient limited by pain;Patient limited by fatigue Patient left: in bed;with call bell/phone within reach;with family/visitor present;with bed alarm set     Time: 1429-1500 PT Time Calculation (min) (ACUTE ONLY): 31 min  Charges:  $  Therapeutic Exercise: 8-22 mins $Therapeutic Activity: 8-22 mins                    G Codes:      Chad Cordial, PT MS Acute Rehab Dept. Number: [phone] 08/26/2014, 3:51 PM

## 2014-08-26 NOTE — Discharge Summary (Signed)
Physician Discharge Summary  Jaime [name] KDX:[id number] DOB: 1941-04-09 DOA: 08/18/2014  PCP: [name], MD  Admit date: 08/18/2014 Discharge date: 08/26/2014  Recommendations for Outpatient Follow-up:  1. Pt will need to follow up with PCP in 2 weeks post discharge 2. Please obtain BMP, hepatic panel and CBC in one week 3. Follow up with General Surgery, Dr. [name], on 09/13/14 at 915AM  Discharge Diagnoses:  1. Sepsis with lactic acidosis due to acute cholecystitis.  -Gen. surgery and GI following, post percutaneous gallbladder drain placement by IR on 08/19/2014 as he is deemed to be a poor operative candidate by surgery. Was on Empiric IV Zosyn from 08-18-14 to 08-23-2014, switched to Augmentin on 08/23/2014 based on cultures .  -Liver enzymes trending down.  LFTs normalized on day of d/c -Plan Augmentin through 08/30/2014 -Per general surgery, cholecystotomy tube will need to stay in place for 6 weeks--surgery which is IR to follow up on the chole drain -Pt. Has follow up with general surgery-Dr. [name] on 09/13/14@915AM    2. Choledocholithiasis -Cholangiogram on 08/22/2014 showed obstructing stones in the cystic duct -08/24/2014 MRCP showed choledocholithiasis without rehabilitation -08/25/14--ERCP--normal appearing ampulla, biliary sphincterotomy, common bile duct stones extracted -appreciate GI -08/26/14--clinically improved and cleared for discharge by GI  3. Hypotension.  -Due to #1 above. Blood pressure is better holding IV fluids now. -resolved -restarted Lotensin -Restarted low-dose metoprolol succinate 25 mg daily   4. DM type II.  -on low dose sliding scale and monitor. -08/19/2014 hemoglobin A1c 8.9  -CBGs controlled -plan d/c on lower dose 70/30 as his CBG have been well-controlled and his oral intake has not been optimal as of yet--plan 5 units twice a day -Please continue to check CBGs before each meal and at bedtime and adjust the patient's insulin regimen  appropriately  5. Chronic Systolic CHF/ischemic cardiomyopathy -compensated -08/19/2014 after EF 45-50 percent, diffuse HK, no change vs Aug 2015 -Continue benazepril -cardiology did not feel patient needed lasix at time of d/c  6. UTI -proteus -continue augmentin as discussed above   7. Elevated troponin -demand ischemia -normalized -no CP -seen by cardiology--no further workup at this time Discharge Condition: Stable  Disposition: SNF     Follow-up Information    Follow up with [name], MD On 09/13/2014.   Specialty:  General Surgery   Why:  arrive by 9:15AM 9:40AM appt    Contact information:   [name]  [address] [phone]       Follow up with [name], MD.   Specialty:  Cardiology   Why:  Office will call you for your followup appointment. Call office if you have not heard back in 3 days.   Contact information:   [address] [place] [address] [phone]       Diet:carb modified Wt Readings from Last 3 Encounters:  08/22/14 72.4 kg (159 lb 9.8 oz)  08/19/13 67.132 kg (148 lb)  08/13/13 67.132 kg (148 lb)    History of present illness:  74 year old African-American gentleman with history of bilateral AKA, severe multiple sclerosis, chronic systolic heart failure, was admitted to the hospital with acute cholecystitis with sepsis, he was seen in my general surgery and GI, he underwent percutaneous gallbladder drain placement along with empiric IV antibiotics, he required IV fluids for sepsis and hypotension, eventually went into acute on chronic heart failure due to third spacing of fluid and fluid overload, seen by critical care and cardiology as well, improved  with gentle Lasix, blood pressure stable for now, he's supposed to undergo cholangiogram on 08/22/2014 to make sure CBD is clean.  This showed obstructing stones in the cystic duct -08/24/2014 MRCP showed choledocholithiasis without  rehabilitation -08/25/14--ERCP--normal appearing ampulla, biliary sphincterotomy, common bile duct stones extracted. The patient's diet was advanced which he tolerated without any worsening abdominal pain or vomiting. The patient's jugular venous antibiotics were transitioned to oral antibiotics. He will continue on Augmentin through 08/30/2014. The patient will follow up with general surgery, Dr. [name] on 09/13/2014 who would eval and arrange follow-up regarding the patient's cholecystotomy tube.   Consultants: [facility] Cardiology Discharge Exam: Filed Vitals:   08/26/14 0537  BP: 174/79  Pulse: 85  Temp: 98.1 F (36.7 C)  Resp: 16   Filed Vitals:   08/25/14 1600 08/25/14 1610 08/25/14 2109 08/26/14 0537  BP: 160/80 138/82 115/68 174/79  Pulse: 79 82 83 85  Temp:   98.4 F (36.9 C) 98.1 F (36.7 C)  TempSrc:   Oral Oral  Resp: 13 15 18 16   Height:      Weight:      SpO2: 95% 96% 96% 100%   General: A&O x 3, NAD, pleasant, cooperative Cardiovascular: RRR, no rub, no gallop, no S3 Respiratory: CTAB, no wheeze, no rhonchi Abdomen:soft, RUQ, RLQ tender without rebound, nondistended, positive bowel sounds Extremities: No edema, No lymphangitis, no petechiae  Discharge Instructions     Medication List    STOP taking these medications        acetaminophen-codeine 300-60 MG per tablet  Commonly known as:  TYLENOL #4     gabapentin 300 MG capsule  Commonly known as:  NEURONTIN      TAKE these medications        amoxicillin-clavulanate 875-125 MG per tablet  Commonly known as:  AUGMENTIN  Take 1 tablet by mouth every 12 (twelve) hours. X 5 additional days     aspirin EC 325 MG tablet  Take 325 mg by mouth daily.     atorvastatin 40 MG tablet  Commonly known as:  LIPITOR  TAKE 1 TABLET (40 MG TOTAL) BY MOUTH AT BEDTIME.     baclofen 10 MG tablet  Commonly known as:  LIORESAL  TAKE 0.5 TABLETS (5 MG TOTAL) BY MOUTH 2 (TWO) TIMES DAILY       benazepril 10 MG tablet  Commonly known as:  LOTENSIN  TAKE 1 TABLET BY MOUTH EVERY DAY     diclofenac sodium 1 % Gel  Commonly known as:  VOLTAREN  Apply 1 application topically 2 (two) times daily as needed (pain). For pain     feeding supplement (PRO-STAT SUGAR FREE 64) Liqd  Take 30 mLs by mouth 3 (three) times daily with meals.     GLUCAGEN 1 MG Solr injection  Generic drug:  glucagon  Inject 1 mg into the vein once as needed for low blood sugar.     insulin aspart protamine- aspart (70-30) 100 UNIT/ML injection  Commonly known as:  NOVOLOG MIX 70/30  Inject 0.05 mLs (5 Units total) into the skin 2 (two) times daily with a meal.     metoprolol succinate 25 MG 24 hr tablet  Commonly known as:  TOPROL-XL  Take 1 tablet (25 mg total) by mouth daily.     mupirocin ointment 2 %  Commonly known as:  BACTROBAN  Apply 1 application topically 2 (two) times daily.     oxyCODONE-acetaminophen 5-325 MG per tablet  Commonly known as:  PERCOCET/ROXICET  Take 1 tablet by mouth every 6 (six) hours as needed for moderate pain (for abdominal pain.).     pantoprazole 40 MG tablet  Commonly known as:  PROTONIX  TAKE 1 TABLET BY MOUTH EVERY DAY AT 12 NOON         The results of significant diagnostics from this hospitalization (including imaging, microbiology, ancillary and laboratory) are listed below for reference.    Significant Diagnostic Studies: Ct Abdomen Pelvis W Contrast  08/18/2014   CLINICAL DATA:  Periumbilical and right upper quadrant pain for 2 days. Elevated white blood cell count and elevated lactic acid.  EXAM: CT ABDOMEN AND PELVIS WITH CONTRAST  TECHNIQUE: Multidetector CT imaging of the abdomen and pelvis was performed using the standard protocol following bolus administration of intravenous contrast.  CONTRAST:  100 mL OMNIPAQUE IOHEXOL 300 MG/ML  SOLN  COMPARISON:  CT abdomen and pelvis 10/09/2010.  FINDINGS: Mild dependent atelectasis is present in the lung bases.  Heart size is enlarged. There is calcific coronary artery disease.  The gallbladder is markedly dilated with wall thickening and surrounding inflammatory change. The liver is diffusely low attenuating with a 1.4 cm cyst on image 15 which is unchanged in appearance. The biliary tree, adrenal glands, spleen and pancreas appear normal. Bilateral renal cysts are noted.  The patient has aortoiliac atherosclerosis. The right common iliac artery is dilated at 1.8 cm. The left common iliac artery measures 1.5 cm. There is a large volume of stool in the cecum. The colon is otherwise unremarkable. The stomach and small bowel appear normal. No lymphadenopathy is identified. Small fat containing umbilical hernia is noted.  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 No other focal bony lesion is identified.  IMPRESSION: Findings highly suspicious for acute cholecystitis. Right upper quadrant ultrasound could be used for further evaluation.  Fatty infiltration of the liver.  Calcific aortic and coronary atherosclerosis. 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  Small fat containing umbilical hernia.  Avascular necrosis of the femoral heads bilaterally.   Electronically Signed   By: Inge Rise M.D.   On: 08/18/2014 16:23   Ir Perc Cholecystostomy  08/19/2014   INDICATION: 74 year old male with a history of acute cholecystitis. He has been referred for evaluation for cholecystostomy placement.  EXAM: ULTRASOUND AND FLUOROSCOPIC-GUIDED CHOLECYSTOSTOMY TUBE PLACEMENT  COMPARISON:  None.  MEDICATIONS: Fentanyl 125 mcg IV; Versed 0 mg IV; The patient is currently admitted to the hospital and on intravenous antibiotics. 500 mg of Levaquin were administered periprocedure.  25 mg Demerol IV  ANESTHESIA/SEDATION: Total Moderate Sedation Time  Zero minutes  CONTRAST:  1mL OMNIPAQUE IOHEXOL 300 MG/ML  SOLN  FLUOROSCOPY TIME:  minutes   COMPLICATIONS: None immediate.  PROCEDURE: Informed written consent was obtained from the patient after a discussion of the risks, benefits and alternatives to treatment. Questions regarding the procedure were encouraged and answered. A timeout was performed prior to the initiation of the procedure.  The right upper abdominal quadrant was prepped and draped in the usual sterile fashion, and a sterile drape was applied covering the operative field. Maximum barrier sterile technique with sterile gowns and gloves were used for the procedure. A timeout was performed prior to the initiation of the procedure. Local anesthesia was provided with 1% lidocaine with epinephrine.  Ultrasound scanning of the right upper quadrant demonstrates a markedly dilated gallbladder. Utilizing a  transhepatic approach, a 22 gauge needle was advanced into the gallbladder under direct ultrasound guidance. An ultrasound image was saved for documentation purposes. Appropriate intraluminal puncture was confirmed with the efflux of bile and advancement of an 0.018 wire into the gallbladder lumen. The needle was exchanged for an [place] set. A small amount of contrast was injected to confirm appropriate intraluminal positioning. Over a Benson wire, a 38.2-French Cook cholecystomy tube was advanced into the gallbladder fossa, coiled and locked. Bile was aspirated and a small amount of contrast was injected as several post procedural spot radiographic images were obtained in various obliquities. The catheter was secured to the skin with suture, connected to a drainage bag and a dressing was placed.  The patient tolerated the procedure well without immediate post procedural complication.  IMPRESSION: Status post percutaneous cholecystostomy tube placement. A sample of fluid was sent to the lab for analysis.  Signed,  Dulcy Fanny. Earleen Newport, DO  [facility]  [facility]   Electronically Signed   By: Corrie Mckusick D.O.   On: 08/19/2014 16:18   Dg Chest Port 1 View  08/21/2014   CLINICAL DATA:  74 year old male with shortness breath.  EXAM: PORTABLE CHEST - 1 VIEW  COMPARISON:  Chest x-ray 08/18/2014.  FINDINGS: Lung volumes are low. No consolidative airspace disease. No pleural effusions. No evidence of pulmonary edema. Heart size is mildly enlarged. Upper mediastinal contours are distorted by patient positioning, but are similar to prior examinations.  IMPRESSION: 1. No radiographic evidence of acute cardiopulmonary disease. 2. Mild cardiomegaly.   Electronically Signed   By: Vinnie Langton M.D.   On: 08/21/2014 15:56   Dg Chest Portable 1 View  08/18/2014   CLINICAL DATA:  Initial encounter for abdominal pain.  Ex-smoker.  EXAM: PORTABLE CHEST - 1 VIEW  COMPARISON:  12/16/2013  FINDINGS: Numerous leads and wires project over the chest. Midline trachea. Moderate cardiomegaly. No pleural effusion or pneumothorax. mild subsegmental atelectasis at the left lung base. No gross free intraperitoneal air. Convex right thoracolumbar spine curvature.  IMPRESSION: Cardiomegaly, without acute disease.   Electronically Signed   By: Abigail Miyamoto M.D.   On: 08/18/2014 13:58   Dg Ercp Biliary & Pancreatic Ducts  08/25/2014   CLINICAL DATA:  Choledocholithiasis, calculous cholecystitis, status post percutaneous cholecystostomy  EXAM: ERCP with sphincterotomy/papillotomy and stone removal.  TECHNIQUE: Multiple spot images obtained with the fluoroscopic device and submitted for interpretation post-procedure.  COMPARISON:  08/24/2014, 08/22/2014  FINDINGS: Spot fluoroscopic views during the procedure demonstrate catheter and guidewire access of the common bile duct. Cholangiogram performed. Diffuse biliary dilatation noted. Filling defects in the distal duct compatible with choledocholithiasis. Balloon sweep performed for stone removal. Sphincterotomy also performed apparently. Please refer to the ERCP for details of the  procedure.  IMPRESSION: Choledocholithiasis and mild biliary dilatation by ERCP.  These images were submitted for radiologic interpretation only. Please see the procedural report for the amount of contrast and the fluoroscopy time utilized.   Electronically Signed   By: Daryll Brod M.D.   On: 08/25/2014 16:29   Mr Abd W/wo Cm/mrcp  08/24/2014   CLINICAL DATA:  Acute cholecystitis with sepsis. Status post percutaneous cholecystostomy.  EXAM: MRI ABDOMEN WITHOUT AND WITH CONTRAST (INCLUDING MRCP)  TECHNIQUE: Multiplanar multisequence MR imaging of the abdomen was performed both before and after the administration of intravenous contrast. Heavily T2-weighted images of the biliary and pancreatic ducts were obtained, and three-dimensional MRCP images were rendered by post processing.  CONTRAST:  88mL MULTIHANCE GADOBENATE DIMEGLUMINE 529 MG/ML IV SOLN  COMPARISON:  CT scan 08/18/2016 and ultrasound 08/19/2014  FINDINGS: Lower chest: There are small bilateral pleural effusions, right greater than left with overlying atelectasis. The heart is mildly enlarged. No pericardial effusion.  Hepatobiliary: There is a simple appearing nonenhancing 14 mm cyst in segment 2. There is also a the enhancing lesion in segment 6 which measures 14 mm. This has typical MR imaging features of a benign hemangioma. No worrisome hepatic lesions or intrahepatic biliary dilatation. Borderline common bile duct dilatation at 7.5 mm. There are multiple small common bile duct stones. A cholecystostomy drainage catheter is noted in the gallbladder which is filled with stones and demonstrates wall thickening and pericholecystic fluid.  Pancreas: Moderate atrophy of the pancreatic tail. No inflammatory changes or mass lesions. Normal pancreatic duct.  Spleen: Normal size.  No focal lesions.  Adrenals/Urinary Tract: The adrenal glands are unremarkable. There are multiple bilateral renal cysts but no worrisome renal lesions or hydronephrosis.   Stomach/Bowel: The stomach, duodenum, visualized small bowel and visualized colon are grossly normal.  Vascular/Lymphatic: No mesenteric or retroperitoneal mass or adenopathy. The aorta and branch vessels are patent. Advanced distal aortic atherosclerotic disease better demonstrated on the CT scan. The major venous structures are patent.  Musculoskeletal: No significant bony findings. Significant scoliosis.  IMPRESSION: 1. Stone filled gallbladder containing a cholecystostomy drainage catheter. There is mild wall thickening and a small amount of pericholecystic fluid. 2. Several small common bile duct stones. No common bile duct dilatation. 3. Benign hepatic lesions including small cysts and a hemangioma. 4. Bilateral pleural effusions and bibasilar atelectasis. 5. Multiple simple appearing renal cysts.   Electronically Signed   By: Kalman Jewels M.D.   On: 08/24/2014 10:16   Ir Cholangiogram Existing Tube  08/22/2014   CLINICAL DATA:  Calculus cholecystitis, status post percutaneous transhepatic cholecystostomy abdomen  EXAM: CHOLANGIOGRAM VIA EXISTING CATHETER  COMPARISON:  08/19/2014, 08/18/2014  FINDINGS: Cholangiogram performed through the existing cholecystostomy. Numerous filling defects within the gallbladder compatible with cholelithiasis. Small filling defects demonstrated within the proximal cystic duct compatible with cystic duct stones. Contrast does not reach the common bile duct or duodenum.  IMPRESSION: Cholelithiasis with obstructing cystic duct stones.   Electronically Signed   By: Daryll Brod M.D.   On: 08/22/2014 12:12   US Abdomen Limited Ruq  08/19/2014   CLINICAL DATA:  Right upper quadrant abdominal pain.  EXAM: US ABDOMEN LIMITED - RIGHT UPPER QUADRANT  COMPARISON:  None.  FINDINGS: Gallbladder:  Gallbladder is moderately distended. Multiple stones and sludge are demonstrated in the gallbladder. Largest stone measures about 8 mm diameter. Focal area of mild gallbladder wall  thickening up to 3.3 mm. Murphy's sign is negative.  Common bile duct:  Diameter: 6.3 mm, upper limits normal. Mild intrahepatic bile duct dilatation is suggested. No filling defects are demonstrated although portions of the bowel duct are obscured by bowel gas.  Liver:  With diffusely increased coarsened liver echotexture likely representing fatty infiltration. No focal lesion definitively identified.  IMPRESSION: Cholelithiasis and gallbladder sludge with mild gallbladder wall thickening and distention. Changes may indicate acute cholecystitis although Murphy's sign is negative. Mild intrahepatic bile duct dilatation of nonspecific etiology.   Electronically Signed   By: Lucienne Capers M.D.   On: 08/19/2014 06:08     Microbiology: Recent Results (from the past 240 hour(s))  Blood Culture (routine x 2)     Status: None   Collection Time: 08/18/14  1:22 PM  Result Value Ref Range Status   Specimen Description BLOOD LEFT ANTECUBITAL  Final   Special Requests BOTTLES DRAWN AEROBIC AND ANAEROBIC 10CC  Final   Culture   Final    NO GROWTH 5 DAYS Performed at [facility]    Report Status 08/24/2014 FINAL  Final  Blood Culture (routine x 2)     Status: None   Collection Time: 08/18/14  1:27 PM  Result Value Ref Range Status   Specimen Description BLOOD HAND LEFT  Final   Special Requests BOTTLES DRAWN AEROBIC ONLY 5CC  Final   Culture   Final    STAPHYLOCOCCUS SPECIES (COAGULASE NEGATIVE) Note: THE SIGNIFICANCE OF ISOLATING THIS ORGANISM FROM A SINGLE SET OF BLOOD CULTURES WHEN MULTIPLE SETS ARE DRAWN IS UNCERTAIN. PLEASE NOTIFY THE MICROBIOLOGY DEPARTMENT WITHIN ONE WEEK IF SPECIATION AND SENSITIVITIES ARE REQUIRED. Note: Gram Stain Report Called to,Read Back By and Verified With: ALEX SOLOMON 08/19/14 1435 BY SMITHERSJ Performed at [facility]    Report Status 08/20/2014 FINAL  Final  Urine culture     Status: None   Collection Time: 08/18/14  1:53 PM  Result Value Ref  Range Status   Specimen Description URINE, CATHETERIZED  Final   Special Requests NONE  Final   Colony Count   Final    >=100,000 COLONIES/ML Performed at [facility]    Culture   Final    PROTEUS MIRABILIS Performed at [facility]    Report Status 08/20/2014 FINAL  Final   Organism ID, Bacteria PROTEUS MIRABILIS  Final      Susceptibility   Proteus mirabilis - MIC*    AMPICILLIN <=2 SENSITIVE Sensitive     CEFAZOLIN <=4 SENSITIVE Sensitive     CEFTRIAXONE <=1 SENSITIVE Sensitive     CIPROFLOXACIN <=0.25 SENSITIVE Sensitive     GENTAMICIN <=1 SENSITIVE Sensitive     LEVOFLOXACIN <=0.12 SENSITIVE Sensitive     NITROFURANTOIN 128 RESISTANT Resistant     TOBRAMYCIN <=1 SENSITIVE Sensitive     TRIMETH/SULFA <=20 SENSITIVE Sensitive     PIP/TAZO <=4 SENSITIVE Sensitive     * PROTEUS MIRABILIS  MRSA PCR Screening     Status: None   Collection Time: 08/18/14  6:02 PM  Result Value Ref Range Status   MRSA by PCR NEGATIVE NEGATIVE Final    Comment:        The GeneXpert MRSA Assay (FDA approved for NASAL specimens only), is one component of a comprehensive MRSA colonization surveillance program. It is not intended to diagnose MRSA infection nor to guide or monitor treatment for MRSA infections.   Body fluid culture     Status: None   Collection Time: 08/19/14  4:09 PM  Result Value Ref Range Status   Specimen Description BILE  Final   Special Requests NONE  Final   Gram Stain   Final    NO WBC SEEN MODERATE GRAM POSITIVE COCCI IN CHAINS Performed at [facility]    Culture   Final    ABUNDANT VIRIDANS STREPTOCOCCUS Performed at [facility]    Report Status 08/22/2014 FINAL  Final   Organism ID, Bacteria VIRIDANS STREPTOCOCCUS  Final      Susceptibility   Viridans streptococcus - MIC (ETEST)*    PENICILLIN <=0.125 SENSITIVE Sensitive     * ABUNDANT VIRIDANS STREPTOCOCCUS  Clostridium Difficile by PCR     Status: None   Collection  Time: 08/21/14 10:55 AM  Result Value Ref  Range Status   C difficile by pcr NEGATIVE NEGATIVE Final     Labs: Basic Metabolic Panel:  Recent Labs Lab 08/22/14 0605 08/23/14 0724 08/24/14 0715 08/25/14 0710 08/26/14 0630  NA 136 136 139 140 138  K 4.0 3.3* 4.2 3.8 3.7  CL 110 105 107 108 103  CO2 22 23 26 24 25   GLUCOSE 116* 124* 149* 108* 101*  BUN 18 12 9 12 9   CREATININE 0.88 0.79 0.72 0.71 0.65  CALCIUM 7.7* 8.0* 8.0* 8.3* 8.0*  MG  --  1.6  --   --   --    Liver Function Tests:  Recent Labs Lab 08/22/14 0605 08/23/14 0724 08/24/14 0715 08/25/14 0710 08/26/14 0630  AST 24 26 34 31 30  ALT 62* 51 49 40 36  ALKPHOS 110 113 113 109 95  BILITOT 1.4* 1.0 0.9 0.8 0.7  PROT 5.7* 5.9* 6.1 6.3 6.0  ALBUMIN 2.0* 2.2* 2.4* 2.4* 2.2*    Recent Labs Lab 08/26/14 0630  LIPASE 31   No results for input(s): AMMONIA in the last 168 hours. CBC:  Recent Labs Lab 08/20/14 0305 08/21/14 0310 08/22/14 0605 08/23/14 0724 08/24/14 0715  WBC 10.2 8.6 8.0 7.5 8.5  HGB 11.4* 12.7* 10.8* 10.8* 11.1*  HCT 34.0* 38.3* 32.6* 32.1* 33.8*  MCV 76.9* 79.3 78.7 75.7* 78.1  PLT 188 211 223 280 294   Cardiac Enzymes:  Recent Labs Lab 08/21/14 1810 08/21/14 2200 08/22/14 0605  TROPONINI 0.03 0.03 0.03   BNP: Invalid input(s): POCBNP CBG:  Recent Labs Lab 08/25/14 0752 08/25/14 1205 08/25/14 1706 08/25/14 2213 08/26/14 0825  GLUCAP 101* 103* 140* 131* 88    Time coordinating discharge:  Greater than 30 minutes  Signed:  Oriah Leinweber, DO [facility] Pager: [phone] 08/26/2014, 10:37 AM

## 2014-08-26 NOTE — Progress Notes (Signed)
Report called into Amy, pt. Will be transferring to [facility].

## 2014-08-26 NOTE — Progress Notes (Signed)
Daily Rounding Note  08/26/2014, 9:21 AM  LOS: 8 days   SUBJECTIVE:       New pain of right side. No nausea.   291ml biliary drain output 08/25/14.   OBJECTIVE:         Vital signs in last 24 hours:    Temp:  [98.1 F (36.7 C)-98.6 F (37 C)] 98.1 F (36.7 C) (02/05 0537) Pulse Rate:  [79-86] 85 (02/05 0537) Resp:  [13-29] 16 (02/05 0537) BP: (115-174)/(68-85) 174/79 mmHg (02/05 0537) SpO2:  [95 %-100 %] 100 % (02/05 0537) Last BM Date: 08/22/14 Filed Weights   08/18/14 1227 08/18/14 1759 08/22/14 0419  Weight: 155 lb (70.308 kg) 146 lb 14.4 oz (66.633 kg) 159 lb 9.8 oz (72.4 kg)   General: looks well.    Heart: RRR Chest: clear bil.  Abdomen: soft, ND.  Active BS.  Tender on right without guard or rebound  Extremities:AKA bil Neuro/Psych:   Oriented x 3.  Alert.  Appropriate.   Intake/Output from previous day: 02/04 0701 - 02/05 0700 In: 815 [I.V.:800] Out: 1435 [Urine:1150; Drains:285]  Intake/Output this shift:    Lab Results:  Recent Labs  08/24/14 0715  WBC 8.5  HGB 11.1*  HCT 33.8*  PLT 294   BMET  Recent Labs  08/24/14 0715 08/25/14 0710 08/26/14 0630  NA 139 140 138  K 4.2 3.8 3.7  CL 107 108 103  CO2 26 24 25   GLUCOSE 149* 108* 101*  BUN 9 12 9   CREATININE 0.72 0.71 0.65  CALCIUM 8.0* 8.3* 8.0*   LFT  Recent Labs  08/24/14 0715 08/25/14 0710 08/26/14 0630  PROT 6.1 6.3 6.0  ALBUMIN 2.4* 2.4* 2.2*  AST 34 31 30  ALT 49 40 36  ALKPHOS 113 109 95  BILITOT 0.9 0.8 0.7   PT/INR No results for input(s): LABPROT, INR in the last 72 hours. Hepatitis Panel No results for input(s): HEPBSAG, HCVAB, HEPAIGM, HEPBIGM in the last 72 hours.  Studies/Results: Dg Ercp Biliary & Pancreatic Ducts  08/25/2014   CLINICAL DATA:  Choledocholithiasis, calculous cholecystitis, status post percutaneous cholecystostomy  EXAM: ERCP with sphincterotomy/papillotomy and stone removal.  TECHNIQUE:  Multiple spot images obtained with the fluoroscopic device and submitted for interpretation post-procedure.  COMPARISON:  08/24/2014, 08/22/2014  FINDINGS: Spot fluoroscopic views during the procedure demonstrate catheter and guidewire access of the common bile duct. Cholangiogram performed. Diffuse biliary dilatation noted. Filling defects in the distal duct compatible with choledocholithiasis. Balloon sweep performed for stone removal. Sphincterotomy also performed apparently. Please refer to the ERCP for details of the procedure.  IMPRESSION: Choledocholithiasis and mild biliary dilatation by ERCP.  These images were submitted for radiologic interpretation only. Please see the procedural report for the amount of contrast and the fluoroscopy time utilized.   Electronically Signed   By: Daryll Brod M.D.   On: 08/25/2014 16:29   Scheduled Meds: . amoxicillin-clavulanate  1 tablet Oral Q12H  . aspirin  81 mg Oral Daily  . baclofen  5 mg Oral BID  . benazepril  10 mg Oral Daily  . feeding supplement (PRO-STAT SUGAR FREE 64)  30 mL Oral TID WC  . insulin aspart  0-5 Units Subcutaneous QHS  . insulin aspart  0-9 Units Subcutaneous TID WC  . pantoprazole  40 mg Oral Q0600   Continuous Infusions: . sodium chloride 1,000 mL (08/25/14 1004)   PRN Meds:.acetaminophen, acetaminophen-codeine, albuterol, alum & mag hydroxide-simeth, HYDROmorphone (DILAUDID)  injection, morphine injection, nitroGLYCERIN, [DISCONTINUED] ondansetron **OR** ondansetron (ZOFRAN) IV, polyethylene glycol, promethazine, sodium chloride  ASSESMENT:    * Acute cholecystitis, bile growing stretp Viridans. S/p 1/29 cholecystostomy tube. LFTs continue improving. Per d/w Dr Rosendo Gros, tube will need to stay in place at least 6 weeks and he recommends 7 more days of abx (Augmentin replaced Zosyn ) which means last dose 08/30/14. .   * Choledocholithiasis. Cholangiogram 2/1 with obstructing stones in cystic duct, unable to determine the  status of CBD. MRCP with CBD stones but non-dilated CBD.  2/4 ERCP/sphincterotomy/stone extraction, good biliary drainage at end of procedure.   ? Post ERCP pancreatitis.  Did receive Indocin PR post procedure.     * Anemia. Earlier this admission MCV low at 60, currently normal. 10/2009 colonoscopy:diminutive "polyp" but pathology c/w vegetable material.  * IDDM.   * Widespread vascular disease: CAD, PVD. 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 On 81 ASA.   * Multiple sclerosis, bed-ridden with this and bil AKA.     PLAN   *  Finish abx on 2/9.  Management of cholecystostomy tube per IR and general surgery.  *  Check lipase.   *  Added prn percocet, if not tolerated or not controlling pain, should switch to dilaudid or morphine. *  Leave on current carb mod diet.     Azucena Freed  08/26/2014, 9:21 AM Pager: [phone]  Addendum of 1313: Lipase is 31, so doubt post ERCP pancreatitis.

## 2014-08-26 NOTE — Progress Notes (Signed)
CARE MANAGEMENT NOTE 08/26/2014  Patient:  Jaime [name], Jaime [name]   Account Number:  [id number]  Date Initiated:  08/22/2014  Documentation initiated by:  MAYO,HENRIETTA  Subjective/Objective Assessment:   pt has MS, now dx with sepsis; lives with spouse, has electric w/c, lift    PCP  [name]     Action/Plan:   Anticipated DC Date:  08/26/2014   Anticipated DC Plan:  [facility] ([facility])  In-house referral  Clinical Social Worker      DC Planning Services  CM consult      Choice offered to / List presented to:             Status of service:  Completed, signed off Medicare Important Message given?  YES (If response is "NO", the following Medicare IM given date fields will be blank) Date Medicare IM given:  08/22/2014 Medicare IM given by:  MAYO,HENRIETTA Date Additional Medicare IM given:  08/26/2014 Additional Medicare IM given by:  Jonnie Finner  Discharge Disposition:  [facility] CARE ([facility])  Per UR Regulation:  Reviewed for med. necessity/level of care/duration of stay  If discussed at [place] of Stay Meetings, dates discussed:    Comments:  08/26/2014 1200 Planned dc to [facility], [facility] today. Faxed dc summary to [place] RN CCM Case Mgmt phone [phone]  08/24/14 Ray, BSN [phone] NCM spoke with patient's wife, she states she would like for patient to go to ltac ([facility]) because it is close to her family's home.  NCM informed Seth Bake with [facility] and Konrad Dolores with [facility] and MD.  Patient is for ERCP today, for possible dc as early as today or tomrrow per MD to [facility]. Seth Bake has authorization for [facility] which last until Friday.  08/22/14 1038 [place] MSN BSN CCM Physical therapist talked with spouse after evaluation, she and spouse agreed that pt needs ST-SNF for rehab before returning home.

## 2014-08-29 ENCOUNTER — Ambulatory Visit: Payer: Medicare Other | Admitting: Physical Medicine & Rehabilitation

## 2014-09-03 DIAGNOSIS — D638 Anemia in other chronic diseases classified elsewhere: Secondary | ICD-10-CM | POA: Diagnosis not present

## 2014-09-03 DIAGNOSIS — E119 Type 2 diabetes mellitus without complications: Secondary | ICD-10-CM | POA: Diagnosis not present

## 2014-09-03 DIAGNOSIS — M419 Scoliosis, unspecified: Secondary | ICD-10-CM | POA: Diagnosis not present

## 2014-09-03 DIAGNOSIS — Z960 Presence of urogenital implants: Secondary | ICD-10-CM | POA: Diagnosis not present

## 2014-09-03 DIAGNOSIS — Z89612 Acquired absence of left leg above knee: Secondary | ICD-10-CM | POA: Diagnosis not present

## 2014-09-03 DIAGNOSIS — Z955 Presence of coronary angioplasty implant and graft: Secondary | ICD-10-CM | POA: Diagnosis not present

## 2014-09-03 DIAGNOSIS — Z87891 Personal history of nicotine dependence: Secondary | ICD-10-CM | POA: Diagnosis not present

## 2014-09-03 DIAGNOSIS — Z794 Long term (current) use of insulin: Secondary | ICD-10-CM | POA: Diagnosis not present

## 2014-09-03 DIAGNOSIS — Z89611 Acquired absence of right leg above knee: Secondary | ICD-10-CM | POA: Diagnosis not present

## 2014-09-03 DIAGNOSIS — Z48815 Encounter for surgical aftercare following surgery on the digestive system: Secondary | ICD-10-CM | POA: Diagnosis not present

## 2014-09-03 DIAGNOSIS — G35 Multiple sclerosis: Secondary | ICD-10-CM | POA: Diagnosis not present

## 2014-09-03 DIAGNOSIS — I5022 Chronic systolic (congestive) heart failure: Secondary | ICD-10-CM | POA: Diagnosis not present

## 2014-09-03 DIAGNOSIS — I2510 Atherosclerotic heart disease of native coronary artery without angina pectoris: Secondary | ICD-10-CM | POA: Diagnosis not present

## 2014-09-05 DIAGNOSIS — D638 Anemia in other chronic diseases classified elsewhere: Secondary | ICD-10-CM | POA: Diagnosis not present

## 2014-09-05 DIAGNOSIS — G35 Multiple sclerosis: Secondary | ICD-10-CM | POA: Diagnosis not present

## 2014-09-05 DIAGNOSIS — E119 Type 2 diabetes mellitus without complications: Secondary | ICD-10-CM | POA: Diagnosis not present

## 2014-09-05 DIAGNOSIS — Z48815 Encounter for surgical aftercare following surgery on the digestive system: Secondary | ICD-10-CM | POA: Diagnosis not present

## 2014-09-05 DIAGNOSIS — I2510 Atherosclerotic heart disease of native coronary artery without angina pectoris: Secondary | ICD-10-CM | POA: Diagnosis not present

## 2014-09-05 DIAGNOSIS — I5022 Chronic systolic (congestive) heart failure: Secondary | ICD-10-CM | POA: Diagnosis not present

## 2014-09-06 DIAGNOSIS — I5022 Chronic systolic (congestive) heart failure: Secondary | ICD-10-CM | POA: Diagnosis not present

## 2014-09-06 DIAGNOSIS — D638 Anemia in other chronic diseases classified elsewhere: Secondary | ICD-10-CM | POA: Diagnosis not present

## 2014-09-06 DIAGNOSIS — I2510 Atherosclerotic heart disease of native coronary artery without angina pectoris: Secondary | ICD-10-CM | POA: Diagnosis not present

## 2014-09-06 DIAGNOSIS — G35 Multiple sclerosis: Secondary | ICD-10-CM | POA: Diagnosis not present

## 2014-09-06 DIAGNOSIS — E119 Type 2 diabetes mellitus without complications: Secondary | ICD-10-CM | POA: Diagnosis not present

## 2014-09-06 DIAGNOSIS — Z48815 Encounter for surgical aftercare following surgery on the digestive system: Secondary | ICD-10-CM | POA: Diagnosis not present

## 2014-09-07 DIAGNOSIS — I2510 Atherosclerotic heart disease of native coronary artery without angina pectoris: Secondary | ICD-10-CM | POA: Diagnosis not present

## 2014-09-07 DIAGNOSIS — E119 Type 2 diabetes mellitus without complications: Secondary | ICD-10-CM | POA: Diagnosis not present

## 2014-09-07 DIAGNOSIS — Z48815 Encounter for surgical aftercare following surgery on the digestive system: Secondary | ICD-10-CM | POA: Diagnosis not present

## 2014-09-07 DIAGNOSIS — D638 Anemia in other chronic diseases classified elsewhere: Secondary | ICD-10-CM | POA: Diagnosis not present

## 2014-09-07 DIAGNOSIS — I5022 Chronic systolic (congestive) heart failure: Secondary | ICD-10-CM | POA: Diagnosis not present

## 2014-09-07 DIAGNOSIS — G35 Multiple sclerosis: Secondary | ICD-10-CM | POA: Diagnosis not present

## 2014-09-07 NOTE — Anesthesia Postprocedure Evaluation (Signed)
  Anesthesia Post-op Note  Patient: Jaime [name]  Procedure(s) Performed: Procedure(s): ENDOSCOPIC RETROGRADE CHOLANGIOPANCREATOGRAPHY (ERCP) (N/A)  Patient Location: PACU  Anesthesia Type:General  Level of Consciousness: awake  Airway and Oxygen Therapy: Patient Spontanous Breathing  Post-op Pain: mild  Post-op Assessment: Post-op Vital signs reviewed  Post-op Vital Signs: Reviewed  Last Vitals:  Filed Vitals:   08/26/14 1957  BP: 130/80  Pulse: 84  Temp: 36.9 C  Resp: 18    Complications: No apparent anesthesia complications

## 2014-09-08 ENCOUNTER — Telehealth: Payer: Self-pay | Admitting: Internal Medicine

## 2014-09-08 ENCOUNTER — Ambulatory Visit: Payer: Medicare Other | Admitting: Internal Medicine

## 2014-09-08 DIAGNOSIS — D638 Anemia in other chronic diseases classified elsewhere: Secondary | ICD-10-CM | POA: Diagnosis not present

## 2014-09-08 DIAGNOSIS — E119 Type 2 diabetes mellitus without complications: Secondary | ICD-10-CM | POA: Diagnosis not present

## 2014-09-08 DIAGNOSIS — I2510 Atherosclerotic heart disease of native coronary artery without angina pectoris: Secondary | ICD-10-CM | POA: Diagnosis not present

## 2014-09-08 DIAGNOSIS — Z48815 Encounter for surgical aftercare following surgery on the digestive system: Secondary | ICD-10-CM | POA: Diagnosis not present

## 2014-09-08 DIAGNOSIS — I5022 Chronic systolic (congestive) heart failure: Secondary | ICD-10-CM | POA: Diagnosis not present

## 2014-09-08 DIAGNOSIS — G35 Multiple sclerosis: Secondary | ICD-10-CM | POA: Diagnosis not present

## 2014-09-08 NOTE — Telephone Encounter (Signed)
Caller name: Sven, Pinheiro Relation to ML:[id number] Call back [phone] Pharmacy:  Reason for call: pt had appt today for hospital follow up, wife states pt needs to go back to the ER because his tube needing draining, so pt has rescheduled appt until 2/26. Pt does not want to be charged no show fee.

## 2014-09-08 NOTE — Telephone Encounter (Signed)
Ok not to charge

## 2014-09-12 DIAGNOSIS — E119 Type 2 diabetes mellitus without complications: Secondary | ICD-10-CM | POA: Diagnosis not present

## 2014-09-12 DIAGNOSIS — Z48815 Encounter for surgical aftercare following surgery on the digestive system: Secondary | ICD-10-CM | POA: Diagnosis not present

## 2014-09-12 DIAGNOSIS — I5022 Chronic systolic (congestive) heart failure: Secondary | ICD-10-CM | POA: Diagnosis not present

## 2014-09-12 DIAGNOSIS — I2510 Atherosclerotic heart disease of native coronary artery without angina pectoris: Secondary | ICD-10-CM | POA: Diagnosis not present

## 2014-09-12 DIAGNOSIS — G35 Multiple sclerosis: Secondary | ICD-10-CM | POA: Diagnosis not present

## 2014-09-12 DIAGNOSIS — D638 Anemia in other chronic diseases classified elsewhere: Secondary | ICD-10-CM | POA: Diagnosis not present

## 2014-09-13 DIAGNOSIS — D638 Anemia in other chronic diseases classified elsewhere: Secondary | ICD-10-CM | POA: Diagnosis not present

## 2014-09-13 DIAGNOSIS — I5022 Chronic systolic (congestive) heart failure: Secondary | ICD-10-CM | POA: Diagnosis not present

## 2014-09-13 DIAGNOSIS — K811 Chronic cholecystitis: Secondary | ICD-10-CM | POA: Diagnosis not present

## 2014-09-13 DIAGNOSIS — I2510 Atherosclerotic heart disease of native coronary artery without angina pectoris: Secondary | ICD-10-CM | POA: Diagnosis not present

## 2014-09-13 DIAGNOSIS — Z48815 Encounter for surgical aftercare following surgery on the digestive system: Secondary | ICD-10-CM | POA: Diagnosis not present

## 2014-09-13 DIAGNOSIS — E119 Type 2 diabetes mellitus without complications: Secondary | ICD-10-CM | POA: Diagnosis not present

## 2014-09-13 DIAGNOSIS — G35 Multiple sclerosis: Secondary | ICD-10-CM | POA: Diagnosis not present

## 2014-09-14 DIAGNOSIS — D638 Anemia in other chronic diseases classified elsewhere: Secondary | ICD-10-CM | POA: Diagnosis not present

## 2014-09-14 DIAGNOSIS — E119 Type 2 diabetes mellitus without complications: Secondary | ICD-10-CM | POA: Diagnosis not present

## 2014-09-14 DIAGNOSIS — I2510 Atherosclerotic heart disease of native coronary artery without angina pectoris: Secondary | ICD-10-CM | POA: Diagnosis not present

## 2014-09-14 DIAGNOSIS — I5022 Chronic systolic (congestive) heart failure: Secondary | ICD-10-CM | POA: Diagnosis not present

## 2014-09-14 DIAGNOSIS — G35 Multiple sclerosis: Secondary | ICD-10-CM | POA: Diagnosis not present

## 2014-09-14 DIAGNOSIS — Z48815 Encounter for surgical aftercare following surgery on the digestive system: Secondary | ICD-10-CM | POA: Diagnosis not present

## 2014-09-15 ENCOUNTER — Telehealth: Payer: Self-pay | Admitting: Cardiology

## 2014-09-15 DIAGNOSIS — E119 Type 2 diabetes mellitus without complications: Secondary | ICD-10-CM | POA: Diagnosis not present

## 2014-09-15 DIAGNOSIS — Z48815 Encounter for surgical aftercare following surgery on the digestive system: Secondary | ICD-10-CM | POA: Diagnosis not present

## 2014-09-15 DIAGNOSIS — G35 Multiple sclerosis: Secondary | ICD-10-CM | POA: Diagnosis not present

## 2014-09-15 DIAGNOSIS — I5022 Chronic systolic (congestive) heart failure: Secondary | ICD-10-CM | POA: Diagnosis not present

## 2014-09-15 DIAGNOSIS — D638 Anemia in other chronic diseases classified elsewhere: Secondary | ICD-10-CM | POA: Diagnosis not present

## 2014-09-15 DIAGNOSIS — I2510 Atherosclerotic heart disease of native coronary artery without angina pectoris: Secondary | ICD-10-CM | POA: Diagnosis not present

## 2014-09-15 NOTE — Telephone Encounter (Signed)
Request for surgical clearance:  1. What type of surgery is being performed? Gall bladder removal  2. When is this surgery scheduled? No  3. Are there any medications that need to be held prior to surgery and how long? Wife is unsure  4. Name of physician performing surgery? Dr. Mercy Riding at [facility]  5. What is your office phone and fax number? Phone: [phone]  Pt's wife calling wanting to know if pt needs to be seen in order to get surg clearance. Please call back and advise.

## 2014-09-15 NOTE — Telephone Encounter (Signed)
Forwarding to Dr. Ron Parker.

## 2014-09-16 ENCOUNTER — Encounter: Payer: Self-pay | Admitting: Internal Medicine

## 2014-09-16 ENCOUNTER — Ambulatory Visit (INDEPENDENT_AMBULATORY_CARE_PROVIDER_SITE_OTHER): Payer: Medicare Other | Admitting: Internal Medicine

## 2014-09-16 VITALS — BP 138/76 | HR 65 | Temp 98.2°F | Resp 14

## 2014-09-16 DIAGNOSIS — K8040 Calculus of bile duct with cholecystitis, unspecified, without obstruction: Secondary | ICD-10-CM | POA: Diagnosis not present

## 2014-09-16 DIAGNOSIS — K819 Cholecystitis, unspecified: Secondary | ICD-10-CM

## 2014-09-16 DIAGNOSIS — K8042 Calculus of bile duct with acute cholecystitis without obstruction: Secondary | ICD-10-CM

## 2014-09-16 DIAGNOSIS — E1159 Type 2 diabetes mellitus with other circulatory complications: Secondary | ICD-10-CM | POA: Diagnosis not present

## 2014-09-16 LAB — COMPREHENSIVE METABOLIC PANEL
ALBUMIN: 3.4 g/dL — AB (ref 3.5–5.2)
ALT: 9 U/L (ref 0–53)
AST: 16 U/L (ref 0–37)
Alkaline Phosphatase: 73 U/L (ref 39–117)
BILIRUBIN TOTAL: 0.6 mg/dL (ref 0.2–1.2)
BUN: 15 mg/dL (ref 6–23)
CO2: 28 mEq/L (ref 19–32)
Calcium: 9.2 mg/dL (ref 8.4–10.5)
Chloride: 107 mEq/L (ref 96–112)
Creatinine, Ser: 0.59 mg/dL (ref 0.40–1.50)
GFR: 173.4 mL/min (ref >60.00)
GLUCOSE: 113 mg/dL — AB (ref 70–99)
Potassium: 3.7 mEq/L (ref 3.5–5.1)
SODIUM: 141 meq/L (ref 135–145)
TOTAL PROTEIN: 8.2 g/dL (ref 6.0–8.3)

## 2014-09-16 LAB — CBC WITH DIFFERENTIAL/PLATELET
BASOS ABS: 0 10*3/uL (ref 0.0–0.1)
Basophils Relative: 0.4 % (ref 0.0–3.0)
Eosinophils Absolute: 0.4 10*3/uL (ref 0.0–0.7)
Eosinophils Relative: 5.5 % — ABNORMAL HIGH (ref 0.0–5.0)
HCT: 35.3 % — ABNORMAL LOW (ref 39.0–52.0)
Hemoglobin: 11.4 g/dL — ABNORMAL LOW (ref 13.0–17.0)
LYMPHS PCT: 26.1 % (ref 12.0–46.0)
Lymphs Abs: 1.8 10*3/uL (ref 0.7–4.0)
MCHC: 32.4 g/dL (ref 30.0–36.0)
MCV: 79.9 fl (ref 78.0–100.0)
Monocytes Absolute: 0.4 10*3/uL (ref 0.1–1.0)
Monocytes Relative: 5.1 % (ref 3.0–12.0)
Neutro Abs: 4.4 10*3/uL (ref 1.4–7.7)
Neutrophils Relative %: 62.9 % (ref 43.0–77.0)
PLATELETS: 350 10*3/uL (ref 150.0–400.0)
RBC: 4.42 Mil/uL (ref 4.22–5.81)
RDW: 18.6 % — ABNORMAL HIGH (ref 11.5–15.5)
WBC: 7 10*3/uL (ref 4.0–10.5)

## 2014-09-16 NOTE — Progress Notes (Signed)
Pre visit review using our clinic review tool, if applicable. No additional management support is needed unless otherwise documented below in the visit note. 

## 2014-09-16 NOTE — Progress Notes (Signed)
Subjective:    Patient ID: Jaime [name], male    DOB: Jul 20, 1941, 74 y.o.   MRN: [id number]  DOS:  09/16/2014 Type of visit - description : Hospital follow-up, here with his wife   Admit date: 08/18/2014 Discharge date: 08/26/2014  Recommendations for Outpatient Follow-up:  1. Pt will need to follow up with PCP in 2 weeks post discharge 2. Please obtain BMP, hepatic panel and CBC in one week 3. Follow up with General Surgery, Dr. [name], on 09/13/14 at 915AM  Discharge Diagnoses:  1. Sepsis with lactic acidosis due to acute cholecystitis.  -Gen. surgery and GI following, post percutaneous gallbladder drain placement by IR on 08/19/2014 as he is deemed to be a poor operative candidate by surgery. Was on Empiric IV Zosyn from 08-18-14 to 08-23-2014, switched to Augmentin on 08/23/2014 based on cultures .  -Liver enzymes trending down. LFTs normalized on day of d/c -Plan Augmentin through 08/30/2014 -Per general surgery, cholecystotomy tube will need to stay in place for 6 weeks--surgery which is IR to follow up on the chole drain -Pt. Has follow up with general surgery-Dr. [name] on 09/13/14@915AM    2. Choledocholithiasis -Cholangiogram on 08/22/2014 showed obstructing stones in the cystic duct -08/24/2014 MRCP showed choledocholithiasis without rehabilitation -08/25/14--ERCP--normal appearing ampulla, biliary sphincterotomy, common bile duct stones extracted -appreciate GI -08/26/14--clinically improved and cleared for discharge by GI  3. Hypotension.  -Due to #1 above. Blood pressure is better holding IV fluids now. -resolved -restarted Lotensin -Restarted low-dose metoprolol succinate 25 mg daily   4. DM type II.  -on low dose sliding scale and monitor. -08/19/2014 hemoglobin A1c 8.9  -CBGs controlled -plan d/c on lower dose 70/30 as his CBG have been well-controlled and his oral intake has not been optimal as of yet--plan 5 units twice a day -Please continue to check  CBGs before each meal and at bedtime and adjust the patient's insulin regimen appropriately  5. Chronic Systolic CHF/ischemic cardiomyopathy -compensated -08/19/2014 after EF 45-50 percent, diffuse HK, no change vs Aug 2015 -Continue benazepril -cardiology did not feel patient needed lasix at time of d/c  6. UTI -proteus -continue augmentin as discussed above   7. Elevated troponin -demand ischemia -normalized -no CP -seen by cardiology--no further workup at this time Discharge Condition: Stable  Disposition: SNF          Review of Systems After the hospital admission, he went to the [facility] for one week and then home. Denies fever chills Appetite is almost back to normal No nausea, vomiting, diarrhea or blood in the stools. No abdominal pain except when he "touch around the umbilicus  sometimes". Already saw surgery for f/u and  his endocrinologist who change his medications. In addition to benazepril, he is also taking lisinopril. BP today is very good.  Past Medical History  Diagnosis Date  . Hyperlipidemia   . Cardiomyopathy, ischemic     EF 45% per ECHO 2008  //   EF 25%, echo, August, 2013 //  Echo (8/15):  Mild LVH, EF 30-35%, ant-lat and lat AK, inf HK, Gr 1 DD, mild MR, mild LAE  . Pulmonary hypertension     moderate ECHO Jan 2008  . Systolic heart failure   . Multiple sclerosis [place] -- LAST VISIT 11-20-2010  NOTE W/ CHART  . Lung nodule     resolved 11-2006 CT Chest  . Tobacco abuse     quit   . Increased prostate  specific antigen (PSA) velocity   . Urinary retention     dx ~ 2-12, like from [place], now with a catheter, saw urology  . Carotid artery disease     a.  Doppler, February, 2012, 0-39% bilateral,Mild smooth plaque;  b.  Carotid US (8/15):  Bilateral 1-39% ICA >>> F/u 2 years  . Chronic indwelling Foley catheter   . Scoliosis associated with other condition   . Hemiparesis   . Fatigue SEVERE  .  Impotence   . MI, acute, non ST segment elevation 2007    S/P PCI WITH X1 STENT (TAXUS DRUG-ELUTING) LEFT CIRCUMFLEX  . Diabetes mellitus     INSULIN-DEPENDANT  . Hypertension   . H/O pleural effusion 2008    POST THORACENTESIS  . History of colon polyps PRECANCEROUS  . Insomnia   . CAD (coronary artery disease) CARDIOLOGIST- DR KATZ-- VISIT 06-05-2011 IN [facility]    non-STEMI, 2007.Marland Kitchenoccluded circumflex.. Taxus stent placed...residual 80% LAD...50% RCA  . Urinary tract infection     hx of  . Atherosclerotic PVD with ulceration     left foot  . PAC (premature atrial contraction)     December, 2013    Past Surgical History  Procedure Laterality Date  . Hernia repair  1990    (R)  . Thoracentesis  2008    PLEURAL EFFUSION  . Cystoscopy  07/30/2011    Procedure: CYSTOSCOPY;  Surgeon: Hanley Ben, MD;  Location: [facility];  Service: Urology;  Laterality: N/A;  . Transurethral resection of prostate  07/30/2011    Procedure: TRANSURETHRAL RESECTION OF THE PROSTATE (TURP);  Surgeon: Hanley Ben, MD;  Location: [facility];  Service: Urology;  Laterality: N/A;  . Femoral-tibial bypass graft  03/11/2012    Procedure: BYPASS GRAFT FEMORAL-TIBIAL ARTERY;  Surgeon: Conrad Sturgis, MD;  Location: [place];  Service: Vascular;  Laterality: Left;  Left Femoral -Tibial trunk bypass, Endarterectomy of Tibial- Peroneal trunk with vein angioplasty.  . Intraoperative arteriogram  03/11/2012    Procedure: INTRA OPERATIVE ARTERIOGRAM;  Surgeon: Conrad [place], MD;  Location: [place];  Service: Vascular;  Laterality: Left;  . Femoral-popliteal bypass graft  03/11/2012    Procedure: BYPASS GRAFT FEMORAL-POPLITEAL ARTERY;  Surgeon: Conrad [place], MD;  Location: [place];  Service: Vascular;  Laterality: Left;  embolectomy left lower leg  . Amputation  03/17/2012    Procedure: AMPUTATION ABOVE KNEE;  Surgeon: Conrad [place], MD;  Location: [place];  Service: Vascular;  Laterality: Left;  .  Coronary angioplasty with stent placement  05-01-2006    OCCLUDED CIRCUMFLEX -- TAXUS STENT PLACMENT  AND RESIDUAL 80% LAD,  50% RCA  . Amputation  06/03/2012    Procedure: AMPUTATION ABOVE KNEE;  Surgeon: Conrad [place], MD;  Location: [place];  Service: Vascular;  Laterality: Right;  . Lower extremity angiogram Bilateral 12/17/2011    Procedure: LOWER EXTREMITY ANGIOGRAM;  Surgeon: Serafina Mitchell, MD;  Location: [facility] CATH LAB;  Service: Cardiovascular;  Laterality: Bilateral;  bil lower extrem angio  . Abdominal angiogram  12/17/2011    Procedure: ABDOMINAL ANGIOGRAM;  Surgeon: Serafina Mitchell, MD;  Location: [facility] CATH LAB;  Service: Cardiovascular;;  . Abdominal aortagram N/A 08/19/2013    Procedure: ABDOMINAL Maxcine Ham;  Surgeon: Conrad Demarest, MD;  Location: [facility] CATH LAB;  Service: Cardiovascular;  Laterality: N/A;  . Ercp N/A 08/25/2014    Procedure: ENDOSCOPIC RETROGRADE CHOLANGIOPANCREATOGRAPHY (ERCP);  Surgeon: Ladene Artist, MD;  Location: [facility] ENDOSCOPY;  Service: Endoscopy;  Laterality: N/A;    History   Social History  . Marital Status: Married    Spouse Name: N/A  . Number of Children: 2  . Years of Education: N/A   Occupational History  . disable    Social History Main Topics  . Smoking status: Former Smoker -- 13 years    Quit date: 07/22/2012  . Smokeless tobacco: Former Systems developer    Quit date: 08/05/2009     Comment: pt states that he is using E-cigs  . Alcohol Use: No  . Drug Use: No  . Sexual Activity: No     Comment: electronic cigarettes   Other Topics Concern  . Not on file   Social History Narrative   Lives w/ wife        Medication List       This list is accurate as of: 09/16/14  6:10 PM.  Always use your most recent med list.               aspirin EC 325 MG tablet  Take 325 mg by mouth daily.     atorvastatin 40 MG tablet  Commonly known as:  LIPITOR  TAKE 1 TABLET (40 MG TOTAL) BY MOUTH AT BEDTIME.     baclofen 10 MG tablet  Commonly known as:   LIORESAL  TAKE 0.5 TABLETS (5 MG TOTAL) BY MOUTH 2 (TWO) TIMES DAILY     diclofenac sodium 1 % Gel  Commonly known as:  VOLTAREN  Apply 1 application topically 2 (two) times daily as needed (pain). For pain     feeding supplement (PRO-STAT SUGAR FREE 64) Liqd  Take 30 mLs by mouth 3 (three) times daily with meals.     GLUCAGEN 1 MG Solr injection  Generic drug:  glucagon  Inject 1 mg into the vein once as needed for low blood sugar.     insulin aspart protamine- aspart (70-30) 100 UNIT/ML injection  Commonly known as:  NOVOLOG MIX 70/30  Inject 0.05 mLs (5 Units total) into the skin 2 (two) times daily with a meal.     lisinopril 40 MG tablet  Commonly known as:  PRINIVIL,ZESTRIL  Take 40 mg by mouth daily.     metoprolol succinate 25 MG 24 hr tablet  Commonly known as:  TOPROL-XL  Take 1 tablet (25 mg total) by mouth daily.     mupirocin ointment 2 %  Commonly known as:  BACTROBAN  Apply 1 application topically 2 (two) times daily.     oxyCODONE-acetaminophen 5-325 MG per tablet  Commonly known as:  PERCOCET/ROXICET  Take 1 tablet by mouth every 6 (six) hours as needed for moderate pain (for abdominal pain.).     pantoprazole 40 MG tablet  Commonly known as:  PROTONIX  TAKE 1 TABLET BY MOUTH EVERY DAY AT 12 NOON           Objective:   Physical Exam BP 138/76 mmHg  Pulse 65  Temp(Src) 98.2 F (36.8 C) (Oral)  Resp 14  Wt  General:   Well developed, well nourished . NAD.  HEENT:  Normocephalic . Face symmetric, atraumatic Lungs:  CTA B Normal respiratory effort, no intercostal retractions, no accessory muscle use. Heart: RRR,  no murmur.  Abdomen:  Not distended, soft, non-tender. No rebound or rigidity. No mass,organomegaly Neurologic:  alert & oriented X3.  Speech normal. Psych  Cognition and judgment appear intact.  Cooperative with normal attention span and concentration.  Behavior appropriate. No  anxious or depressed appearing. As usual he seems  in  very good spirits today       Assessment & Plan:    Status post admission to the hospital with sepsis due to acute choledocholithiasis and cholecystitis. Also had a UTI. Finished antibiotics as rx. Already saw surgery for a follow-up, plan is to remove the gallbladder drain at some point in the near future, currently draining less than 5 mL daily according to the patient. He will need cardiac clearance if surgery is needed . He seems to be doing well.  Diabetes, was discharged on 5 units of insulin, as he is eating regularly, his insulin dose was adjusted as recommended by his endocrinologist  Hypertension, he was discharged on benazepril 10 mg only but the wife states that he is also taking lisinopril 40. Plan: Discontinue benazepril, stay on lisinopril 40 mg  Check a CMP and CBC as recommended during the hospital.

## 2014-09-16 NOTE — Patient Instructions (Addendum)
Get your blood work before you leave    Continue lisinopril Stop benazepril 10 mg  Call if you have questions about your meds    Come back in 4 months  for a  office visit

## 2014-09-18 NOTE — Telephone Encounter (Signed)
He does not need office visit. He is cleared from cardiac viewpoint.

## 2014-09-19 DIAGNOSIS — I2510 Atherosclerotic heart disease of native coronary artery without angina pectoris: Secondary | ICD-10-CM | POA: Diagnosis not present

## 2014-09-19 DIAGNOSIS — E119 Type 2 diabetes mellitus without complications: Secondary | ICD-10-CM | POA: Diagnosis not present

## 2014-09-19 DIAGNOSIS — I5022 Chronic systolic (congestive) heart failure: Secondary | ICD-10-CM | POA: Diagnosis not present

## 2014-09-19 DIAGNOSIS — D638 Anemia in other chronic diseases classified elsewhere: Secondary | ICD-10-CM | POA: Diagnosis not present

## 2014-09-19 DIAGNOSIS — Z48815 Encounter for surgical aftercare following surgery on the digestive system: Secondary | ICD-10-CM | POA: Diagnosis not present

## 2014-09-19 DIAGNOSIS — G35 Multiple sclerosis: Secondary | ICD-10-CM | POA: Diagnosis not present

## 2014-09-19 NOTE — Telephone Encounter (Signed)
**Note De-Identified  Obfuscation** The pts wife is advised and she verbalized understanding. Also, I have faxed this note to Dr Coralie Keens, GI., MD per the pts wife request.

## 2014-09-19 NOTE — Telephone Encounter (Signed)
LMTCB

## 2014-09-23 ENCOUNTER — Telehealth: Payer: Self-pay | Admitting: Cardiology

## 2014-09-23 NOTE — Telephone Encounter (Signed)
New message      Request for surgical clearance:  What type of surgery is being performed? gallbladder 1. When is this surgery scheduled? Not scheduled  2. Are there any medications that need to be held prior to surgery and how long?no----Dr Larose Kells said he was slightly anemic.  Can he still have the surgery?  3. Name of physician performing surgery? Dr Coralie Keens  4. What is your office phone and fax number?  5. Dr Ron Parker already approved surgery but that was before pt knew he was anemic

## 2014-09-23 NOTE — Telephone Encounter (Signed)
Patient has appointment with surgeon on Monday, 3/7, to schedule gallbladder surgery. Patient just wants to make sure that Dr. Ron Parker is still okay with providing surgical clearance in light of the patient's "anemia", as noted in lab work from 09/16/14. Routed to Dr. Ron Parker

## 2014-09-26 ENCOUNTER — Other Ambulatory Visit (INDEPENDENT_AMBULATORY_CARE_PROVIDER_SITE_OTHER): Payer: Self-pay | Admitting: Surgery

## 2014-09-26 DIAGNOSIS — K811 Chronic cholecystitis: Secondary | ICD-10-CM | POA: Diagnosis not present

## 2014-09-28 ENCOUNTER — Telehealth: Payer: Self-pay | Admitting: Internal Medicine

## 2014-09-28 DIAGNOSIS — Z48815 Encounter for surgical aftercare following surgery on the digestive system: Secondary | ICD-10-CM | POA: Diagnosis not present

## 2014-09-28 DIAGNOSIS — I5022 Chronic systolic (congestive) heart failure: Secondary | ICD-10-CM | POA: Diagnosis not present

## 2014-09-28 DIAGNOSIS — G35 Multiple sclerosis: Secondary | ICD-10-CM | POA: Diagnosis not present

## 2014-09-28 DIAGNOSIS — I2510 Atherosclerotic heart disease of native coronary artery without angina pectoris: Secondary | ICD-10-CM | POA: Diagnosis not present

## 2014-09-28 DIAGNOSIS — D638 Anemia in other chronic diseases classified elsewhere: Secondary | ICD-10-CM | POA: Diagnosis not present

## 2014-09-28 DIAGNOSIS — E119 Type 2 diabetes mellitus without complications: Secondary | ICD-10-CM | POA: Diagnosis not present

## 2014-09-28 NOTE — Telephone Encounter (Signed)
FYI. Please advise.

## 2014-09-28 NOTE — Telephone Encounter (Signed)
Lisinopril 40 mg d/c, and changed to Lisinopril 10 mg.

## 2014-09-28 NOTE — Telephone Encounter (Signed)
I am OK with Hemoglobin value.

## 2014-09-28 NOTE — Telephone Encounter (Signed)
Caller name: tracy from [facility] Relation to pt: Call back number: [phone] Pharmacy:  Reason for call:   She wants Dr. Larose Kells to know that patient is only taking 10mg  daily of lisinopril. Patient wants to know if he should go back on benazepril

## 2014-09-28 NOTE — Telephone Encounter (Signed)
Stay on Lisinopril 10 mg daily only , call if his BP is high Please update his medication list.

## 2014-09-28 NOTE — Telephone Encounter (Signed)
Spoke with Olivia Mackie, informed her that Pt okay to take Lisinopril 10 mg daily and to monitor BP's and to call if BP's begin increasing. Olivia Mackie verbalized understanding and informed she would inform family.

## 2014-09-29 ENCOUNTER — Telehealth: Payer: Self-pay | Admitting: Internal Medicine

## 2014-09-29 NOTE — Telephone Encounter (Signed)
Okay, so does Pt need to be seen or?

## 2014-09-29 NOTE — Telephone Encounter (Signed)
The pts wife is advised and she verbalized understanding. 

## 2014-09-29 NOTE — Telephone Encounter (Signed)
HE IS BEING DISCHARGED FROM [facility] AS OF TODAY  HIS DRAIN HAS BEEN REMOVED BY HIS SURGEON

## 2014-09-29 NOTE — Telephone Encounter (Signed)
No the nurse just wanted Dr Larose Kells to know

## 2014-09-30 NOTE — Telephone Encounter (Signed)
thx

## 2014-09-30 NOTE — Telephone Encounter (Signed)
FYI

## 2014-10-11 DIAGNOSIS — E1165 Type 2 diabetes mellitus with hyperglycemia: Secondary | ICD-10-CM | POA: Diagnosis not present

## 2014-10-19 ENCOUNTER — Other Ambulatory Visit: Payer: Self-pay | Admitting: Internal Medicine

## 2014-10-20 ENCOUNTER — Other Ambulatory Visit: Payer: Self-pay | Admitting: Neurology

## 2014-10-21 ENCOUNTER — Telehealth: Payer: Self-pay | Admitting: Internal Medicine

## 2014-10-21 NOTE — Telephone Encounter (Signed)
FYI

## 2014-10-21 NOTE — Telephone Encounter (Signed)
Thank you :)

## 2014-10-21 NOTE — Telephone Encounter (Signed)
Caller name: catherine Relation to pt: wife Call back number: [phone] Pharmacy:  Reason for call:   Patient wife states that patient will be having gallbladder surgery on 10/25/14.

## 2014-10-24 ENCOUNTER — Encounter (HOSPITAL_COMMUNITY): Payer: Self-pay

## 2014-10-24 ENCOUNTER — Encounter (HOSPITAL_COMMUNITY)
Admission: RE | Admit: 2014-10-24 | Discharge: 2014-10-24 | Disposition: A | Discharge status: Home / Self Care | Payer: Medicare Other | Source: Ambulatory Visit | Attending: Surgery | Admitting: Surgery

## 2014-10-24 DIAGNOSIS — Z794 Long term (current) use of insulin: Secondary | ICD-10-CM | POA: Diagnosis not present

## 2014-10-24 DIAGNOSIS — E119 Type 2 diabetes mellitus without complications: Secondary | ICD-10-CM | POA: Diagnosis not present

## 2014-10-24 DIAGNOSIS — I4891 Unspecified atrial fibrillation: Secondary | ICD-10-CM | POA: Diagnosis not present

## 2014-10-24 DIAGNOSIS — Z8673 Personal history of transient ischemic attack (TIA), and cerebral infarction without residual deficits: Secondary | ICD-10-CM | POA: Diagnosis not present

## 2014-10-24 DIAGNOSIS — I10 Essential (primary) hypertension: Secondary | ICD-10-CM | POA: Diagnosis not present

## 2014-10-24 DIAGNOSIS — Z951 Presence of aortocoronary bypass graft: Secondary | ICD-10-CM | POA: Diagnosis not present

## 2014-10-24 DIAGNOSIS — Z87891 Personal history of nicotine dependence: Secondary | ICD-10-CM | POA: Diagnosis not present

## 2014-10-24 DIAGNOSIS — K8012 Calculus of gallbladder with acute and chronic cholecystitis without obstruction: Secondary | ICD-10-CM | POA: Diagnosis not present

## 2014-10-24 DIAGNOSIS — E780 Pure hypercholesterolemia: Secondary | ICD-10-CM | POA: Diagnosis not present

## 2014-10-24 DIAGNOSIS — I509 Heart failure, unspecified: Secondary | ICD-10-CM | POA: Diagnosis not present

## 2014-10-24 HISTORY — DX: Unspecified dementia, unspecified severity, without behavioral disturbance, psychotic disturbance, mood disturbance, and anxiety: F03.90

## 2014-10-24 HISTORY — DX: Constipation, unspecified: K59.00

## 2014-10-24 LAB — CBC
HEMATOCRIT: 36.5 % — AB (ref 39.0–52.0)
HEMOGLOBIN: 12 g/dL — AB (ref 13.0–17.0)
MCH: 25.8 pg — AB (ref 26.0–34.0)
MCHC: 32.9 g/dL (ref 30.0–36.0)
MCV: 78.3 fL (ref 78.0–100.0)
Platelets: 193 10*3/uL (ref 150–400)
RBC: 4.66 MIL/uL (ref 4.22–5.81)
RDW: 17.1 % — ABNORMAL HIGH (ref 11.5–15.5)
WBC: 7.8 10*3/uL (ref 4.0–10.5)

## 2014-10-24 LAB — BASIC METABOLIC PANEL
Anion gap: 7 (ref 5–15)
BUN: 14 mg/dL (ref 6–23)
CALCIUM: 9.1 mg/dL (ref 8.4–10.5)
CO2: 26 mmol/L (ref 19–32)
CREATININE: 0.71 mg/dL (ref 0.50–1.35)
Chloride: 106 mmol/L (ref 96–112)
GFR calc non Af Amer: >90 mL/min (ref >90)
Glucose, Bld: 52 mg/dL — ABNORMAL LOW (ref 70–99)
Potassium: 3.6 mmol/L (ref 3.5–5.1)
Sodium: 139 mmol/L (ref 135–145)

## 2014-10-24 MED ORDER — CEFAZOLIN SODIUM-DEXTROSE 2-3 GM-% IV SOLR
2.0000 g | INTRAVENOUS | Status: AC
  Administered 2014-10-25: 2 g via INTRAVENOUS
  Filled 2014-10-24: qty 50

## 2014-10-24 NOTE — H&P (Signed)
Jaime [name] 09/26/2014 3:05 PM Location: [facility] Patient #: [id number] DOB: 1941/01/22 Married / Language: English / Race: Black or African American Male  History of Present Illness Jaime [name] A. [name] MD; 09/26/2014 3:30 PM) Patient words: postop.  The patient is a 74 year old male who presents with symptomatic choledocholithiasis. He is here for another follow-up visit. Since I saw him last, he has been cleared from a Cardiologic standpoint to undergo lap Cholecystectomy. He is doing well and had no abdominal complaints.  He has a perc chole tube placed by IR during admission for cholecystitis   Other Problems ([name], LPN; 12/24/4648 3:54 PM) Atrial Fibrillation Cerebrovascular Accident Congestive Heart Failure Diabetes Mellitus Enlarged Prostate High blood pressure Hypercholesterolemia Inguinal Hernia Sleep Apnea Vascular Disease  Past Surgical History ([name], LPN; 12/25/6810 7:51 PM) Bypass Surgery for Poor Blood Flow to Legs Coronary Artery Bypass Graft  Diagnostic Studies History ([name], LPN; 7/0/0174 9:44 PM) Colonoscopy 5-10 years ago  Allergies ([name], LPN; 03/27/7590 6:38 PM) Bee Pollen *NUTRIENTS*  Medication History ([name], LPN; 10/25/6597 3:57 PM) Atorvastatin Calcium (40MG  Tablet, Oral) Active. Benazepril HCl (10MG  Tablet, Oral) Active. Lisinopril (10MG  Tablet, Oral) Active. Metoprolol Succinate ER (25MG  Tablet ER 24HR, Oral) Active. NovoLOG Mix 70/30 FlexPen ((70-30) 100UNIT/ML Susp Pen-inj, Subcutaneous) Active. OneTouch Delica Lancets 01X Active. OneTouch Verio (In Vitro) Active. Pantoprazole Sodium (40MG  Tablet DR, Oral) Active. Voltaren (1% Gel, Transdermal) Active. Baclofen (10MG  Tablet, Oral) Active. Mupirocin (2% Ointment, External) Active.  Social History ([name], LPN; 01/27/3902 0:09 PM) Alcohol use Remotely quit alcohol use. Caffeine use Coffee, Tea. No  drug use Tobacco use Former smoker.  Family History [name], LPN; 08/24/3005 6:22 PM) Cerebrovascular Accident Mother. Heart Disease Mother. Hypertension Mother. Respiratory Condition Father.  Review of Systems ([name] LPN; 12/22/3352 5:62 PM) General Present- Fever and Night Sweats. Not Present- Chills, Fatigue, Weight Gain and Weight Loss. HEENT Present- Wears glasses/contact lenses. Not Present- Earache, Hearing Loss, Hoarseness, Nose Bleed, Oral Ulcers, Ringing in the Ears, Seasonal Allergies, Sinus Pain, Sore Throat, Visual Disturbances and Yellow Eyes. Respiratory Present- Difficulty Breathing and Wheezing. Not Present- Bloody sputum, Chronic Cough and Snoring. Gastrointestinal Present- Abdominal Pain and Gets full quickly at meals. Not Present- Bloating, Bloody Stool, Change in Bowel Habits, Chronic diarrhea, Constipation, Difficulty Swallowing, Excessive gas, Hemorrhoids, Indigestion, Nausea, Rectal Pain and Vomiting. Male Genitourinary Present- Impotence, Urgency and Urine Leakage. Not Present- Blood in Urine, Change in Urinary Stream, Frequency, Nocturia and Painful Urination. Musculoskeletal Present- Joint Pain. Not Present- Back Pain, Joint Stiffness, Muscle Pain, Muscle Weakness and Swelling of Extremities. Neurological Present- Decreased Memory, Numbness, Tingling and Weakness. Not Present- Fainting, Headaches, Seizures, Tremor and Trouble walking. Psychiatric Present- Change in Sleep Pattern. Not Present- Anxiety, Bipolar, Depression, Fearful and Frequent crying.   Vitals ([name] LPN; 11/25/3891 7:34 PM) 09/26/2014 3:10 PM Temp.: 97.6F(Oral)  Pulse: 72 (Regular)  BP: 126/78 (Sitting, Left Arm, Standard)    Physical Exam (Ismeal Heider A. [name] MD; 09/26/2014 3:30 PM) The physical exam findings are as follows: Note:Again, on exam he is wheelchair bound. His abdomen is soft and nontender. I removed the percutaneous cholecystostomy tube Lungs  clear cv RRR Skin shows no jaundice   Assessment & Plan (Chaquita Basques A. [name] MD; 09/26/2014 3:30 PM) CHOLECYSTITIS, CHRONIC (575.11  K81.1) Impression: I again discussed the disease process with the patient and his family. Laparoscopic cholecystectomy is recommended. I discussed the risks and detail. Surgery will be scheduled. The risks include but are not  limited to bleeding, infection, injury to the CBD or surrounding structures, need to convert to an open procedure, cardiopulmonary problems, etc.     Signed by [name], MD (09/26/2014 3:30 PM)

## 2014-10-24 NOTE — Progress Notes (Signed)
I spoke with Mrs Sprinks and notified time change, new arrival time is 8:50.  Mrs Knee reported that patient had a snack and that she will check CBG before he eats dinner.

## 2014-10-24 NOTE — Pre-Procedure Instructions (Signed)
Jaime [name]  10/24/2014   Your procedure is scheduled on:  Tuesday, April 5  Report to [facility] Admitting at 9:45AM.  Call this number if you have problems the morning of surgery: [phone]   Remember:   Do not eat food or drink liquids after midnight.   Take these medicines the morning of surgery with A SIP OF WATER: metoprolol succinate (TOPROL-XL), pantoprazole (PROTONIX), baclofen (LIORESAL).              Take if needed: glucagon (GLUCAGEN).             Stop taking Aspirin, Coumadin, Plavix, Effient and Herbal medications.  Do not take any NSAIDs ie: Ibuprofen,  Advil,Naproxen or any medication containing Aspirin.  Stop using diclofenac sodium (VOLTAREN) gel.    Do not wear jewelry, make-up or nail polish.  Do not wear lotions, powders, or perfumes.   Men may shave face and neck.  Do not bring valuables to the hospital.             [facility] is not responsible  for any belongings or valuables.               Contacts, dentures or bridgework may not be worn into surgery.  Leave suitcase in the car. After surgery it may be brought to your room.  For patients admitted to the hospital, discharge time is determined by your treatment team.               Patients discharged the day of surgery will not be allowed to drive home.  Name and phone number of your driver: -   Special Instructions: Review  [facility] - Preparing For Surgery.   Please read over the following fact sheets that you were given: Pain Booklet, Coughing and Deep Breathing and Surgical Site Infection Prevention

## 2014-10-24 NOTE — Progress Notes (Signed)
[name] reports patients CBG's run around 120. Last A1C was 6.6.

## 2014-10-24 NOTE — Pre-Procedure Instructions (Signed)
Jaime [name]  10/24/2014   Your procedure is scheduled on:  Tuesday, April 5  Report to [facility] Admitting at 9:45AM.  Call this number if you have problems the morning of surgery: [phone]   Remember:   Do not eat food or drink liquids after midnight.   Take these medicines the morning of surgery with A SIP OF WATER: metoprolol succinate (TOPROL-XL), pantoprazole (PROTONIX).              Take if needed: glucagon (GLUCAGEN).             Stop taking Aspirin, Coumadin, Plavix, Effient and Herbal medications.  Do not take any NSAIDs ie: Ibuprofen,  Advil,Naproxen or any medication containing Aspirin.  Stop using diclofenac sodium (VOLTAREN) gel.    Do not wear jewelry, make-up or nail polish.  Do not wear lotions, powders, or perfumes.   Men may shave face and neck.  Do not bring valuables to the hospital.             [facility] is not responsible  for any belongings or valuables.               Contacts, dentures or bridgework may not be worn into surgery.  Leave suitcase in the car. After surgery it may be brought to your room.  For patients admitted to the hospital, discharge time is determined by your treatment team.               Patients discharged the day of surgery will not be allowed to drive home.  Name and phone number of your driver: -   Special Instructions: Review  [facility] - Preparing For Surgery.   Please read over the following fact sheets that you were given: Pain Booklet, Coughing and Deep Breathing and Surgical Site Infection Prevention

## 2014-10-24 NOTE — Progress Notes (Signed)
Mr. Siebert' blood glucose was 52.  I called # list as best to call, there was no answer.I left a message with information of low glucose.  I asked Mrs Neville to call me back after it was treated.  Patient had no symptoms of low blood sugar.  He was alert and oriented, skin  Dry and no complaints.   Patient did say that he was sleepy, Mrs pinks said that patient stays up all night watching TV and sleeps during the day time.

## 2014-10-25 ENCOUNTER — Ambulatory Visit (HOSPITAL_COMMUNITY): Payer: Medicare Other | Admitting: Anesthesiology

## 2014-10-25 ENCOUNTER — Encounter (HOSPITAL_COMMUNITY)
Admission: RE | Disposition: A | Discharge status: Home / Self Care | Payer: Self-pay | Source: Ambulatory Visit | Attending: Surgery

## 2014-10-25 ENCOUNTER — Telehealth: Payer: Self-pay | Admitting: Internal Medicine

## 2014-10-25 ENCOUNTER — Encounter (HOSPITAL_COMMUNITY): Payer: Self-pay | Admitting: *Deleted

## 2014-10-25 ENCOUNTER — Observation Stay (HOSPITAL_COMMUNITY)
Admission: RE | Admit: 2014-10-25 | Discharge: 2014-10-26 | Disposition: A | Discharge status: Home / Self Care | Payer: Medicare Other | Source: Ambulatory Visit | Attending: Surgery | Admitting: Surgery

## 2014-10-25 DIAGNOSIS — K8012 Calculus of gallbladder with acute and chronic cholecystitis without obstruction: Secondary | ICD-10-CM | POA: Diagnosis not present

## 2014-10-25 DIAGNOSIS — I4891 Unspecified atrial fibrillation: Secondary | ICD-10-CM | POA: Insufficient documentation

## 2014-10-25 DIAGNOSIS — E119 Type 2 diabetes mellitus without complications: Secondary | ICD-10-CM | POA: Insufficient documentation

## 2014-10-25 DIAGNOSIS — I509 Heart failure, unspecified: Secondary | ICD-10-CM | POA: Diagnosis not present

## 2014-10-25 DIAGNOSIS — K812 Acute cholecystitis with chronic cholecystitis: Secondary | ICD-10-CM | POA: Diagnosis not present

## 2014-10-25 DIAGNOSIS — Z951 Presence of aortocoronary bypass graft: Secondary | ICD-10-CM | POA: Insufficient documentation

## 2014-10-25 DIAGNOSIS — Z8673 Personal history of transient ischemic attack (TIA), and cerebral infarction without residual deficits: Secondary | ICD-10-CM | POA: Insufficient documentation

## 2014-10-25 DIAGNOSIS — I10 Essential (primary) hypertension: Secondary | ICD-10-CM | POA: Diagnosis not present

## 2014-10-25 DIAGNOSIS — Z794 Long term (current) use of insulin: Secondary | ICD-10-CM | POA: Insufficient documentation

## 2014-10-25 DIAGNOSIS — Z87891 Personal history of nicotine dependence: Secondary | ICD-10-CM | POA: Insufficient documentation

## 2014-10-25 DIAGNOSIS — E780 Pure hypercholesterolemia: Secondary | ICD-10-CM | POA: Insufficient documentation

## 2014-10-25 DIAGNOSIS — K819 Cholecystitis, unspecified: Secondary | ICD-10-CM | POA: Diagnosis not present

## 2014-10-25 DIAGNOSIS — I2510 Atherosclerotic heart disease of native coronary artery without angina pectoris: Secondary | ICD-10-CM | POA: Diagnosis not present

## 2014-10-25 DIAGNOSIS — Z9049 Acquired absence of other specified parts of digestive tract: Secondary | ICD-10-CM

## 2014-10-25 HISTORY — PX: CHOLECYSTECTOMY: SHX55

## 2014-10-25 HISTORY — DX: Transient cerebral ischemic attack, unspecified: G45.9

## 2014-10-25 HISTORY — DX: Sleep apnea, unspecified: G47.30

## 2014-10-25 LAB — GLUCOSE, CAPILLARY
GLUCOSE-CAPILLARY: 149 mg/dL — AB (ref 70–99)
Glucose-Capillary: 102 mg/dL — ABNORMAL HIGH (ref 70–99)

## 2014-10-25 LAB — MRSA PCR SCREENING: MRSA by PCR: NEGATIVE

## 2014-10-25 SURGERY — LAPAROSCOPIC CHOLECYSTECTOMY WITH INTRAOPERATIVE CHOLANGIOGRAM
Anesthesia: General | Site: Abdomen

## 2014-10-25 MED ORDER — DEXAMETHASONE SODIUM PHOSPHATE 4 MG/ML IJ SOLN
INTRAMUSCULAR | Status: AC
  Filled 2014-10-25: qty 2

## 2014-10-25 MED ORDER — LISINOPRIL 10 MG PO TABS
10.0000 mg | ORAL_TABLET | Freq: Every day | ORAL | Status: DC
  Administered 2014-10-25 – 2014-10-26 (×2): 10 mg via ORAL
  Filled 2014-10-25 (×2): qty 1

## 2014-10-25 MED ORDER — PANTOPRAZOLE SODIUM 40 MG PO TBEC
40.0000 mg | DELAYED_RELEASE_TABLET | Freq: Every day | ORAL | Status: DC
  Administered 2014-10-26: 40 mg via ORAL
  Filled 2014-10-25: qty 1

## 2014-10-25 MED ORDER — INSULIN ASPART PROT & ASPART (70-30 MIX) 100 UNIT/ML ~~LOC~~ SUSP
5.0000 [IU] | Freq: Two times a day (BID) | SUBCUTANEOUS | Status: DC
  Administered 2014-10-26: 5 [IU] via SUBCUTANEOUS
  Filled 2014-10-25: qty 10

## 2014-10-25 MED ORDER — ONDANSETRON HCL 4 MG/2ML IJ SOLN: 4.0000 mg | Freq: Four times a day (QID) | INTRAMUSCULAR | Status: DC | PRN

## 2014-10-25 MED ORDER — PHENYLEPHRINE HCL 10 MG/ML IJ SOLN
INTRAMUSCULAR | Status: DC | PRN
  Administered 2014-10-25: 120 ug via INTRAVENOUS

## 2014-10-25 MED ORDER — ONDANSETRON HCL 4 MG/2ML IJ SOLN
4.0000 mg | Freq: Four times a day (QID) | INTRAMUSCULAR | Status: DC | PRN
  Administered 2014-10-25: 4 mg via INTRAVENOUS
  Filled 2014-10-25: qty 2

## 2014-10-25 MED ORDER — METOPROLOL SUCCINATE ER 25 MG PO TB24
25.0000 mg | ORAL_TABLET | Freq: Every day | ORAL | Status: DC
  Administered 2014-10-26: 25 mg via ORAL
  Filled 2014-10-25 (×2): qty 1

## 2014-10-25 MED ORDER — OXYCODONE-ACETAMINOPHEN 5-325 MG PO TABS: 1.0000 | ORAL_TABLET | Freq: Four times a day (QID) | ORAL | Status: DC | PRN

## 2014-10-25 MED ORDER — LIDOCAINE HCL (CARDIAC) 20 MG/ML IV SOLN
INTRAVENOUS | Status: DC | PRN
  Administered 2014-10-25: 50 mg via INTRAVENOUS

## 2014-10-25 MED ORDER — LABETALOL HCL 5 MG/ML IV SOLN
5.0000 mg | Freq: Once | INTRAVENOUS | Status: AC
  Administered 2014-10-25: 5 mg via INTRAVENOUS

## 2014-10-25 MED ORDER — OXYCODONE HCL 5 MG PO TABS
5.0000 mg | ORAL_TABLET | Freq: Once | ORAL | Status: AC | PRN
  Administered 2014-10-25: 5 mg via ORAL

## 2014-10-25 MED ORDER — FENTANYL CITRATE 0.05 MG/ML IJ SOLN
25.0000 ug | INTRAMUSCULAR | Status: DC | PRN
  Administered 2014-10-25 (×4): 25 ug via INTRAVENOUS

## 2014-10-25 MED ORDER — LIDOCAINE HCL (CARDIAC) 20 MG/ML IV SOLN
INTRAVENOUS | Status: AC
  Filled 2014-10-25: qty 5

## 2014-10-25 MED ORDER — ONDANSETRON HCL 4 MG PO TABS: 4.0000 mg | ORAL_TABLET | Freq: Four times a day (QID) | ORAL | Status: DC | PRN

## 2014-10-25 MED ORDER — FENTANYL CITRATE 0.05 MG/ML IJ SOLN
INTRAMUSCULAR | Status: AC
  Filled 2014-10-25: qty 2

## 2014-10-25 MED ORDER — OXYCODONE HCL 5 MG/5ML PO SOLN: 5.0000 mg | Freq: Once | ORAL | Status: AC | PRN

## 2014-10-25 MED ORDER — FENTANYL CITRATE 0.05 MG/ML IJ SOLN
INTRAMUSCULAR | Status: DC | PRN
  Administered 2014-10-25: 50 ug via INTRAVENOUS
  Administered 2014-10-25: 100 ug via INTRAVENOUS
  Administered 2014-10-25 (×2): 50 ug via INTRAVENOUS

## 2014-10-25 MED ORDER — ENOXAPARIN SODIUM 30 MG/0.3ML ~~LOC~~ SOLN
30.0000 mg | SUBCUTANEOUS | Status: DC
  Administered 2014-10-26: 30 mg via SUBCUTANEOUS
  Filled 2014-10-25: qty 0.3

## 2014-10-25 MED ORDER — SODIUM CHLORIDE 0.9 % IV SOLN
INTRAVENOUS | Status: DC
  Administered 2014-10-25: 16:00:00 via INTRAVENOUS

## 2014-10-25 MED ORDER — HEMOSTATIC AGENTS (NO CHARGE) OPTIME
TOPICAL | Status: DC | PRN
  Administered 2014-10-25: 1 via TOPICAL

## 2014-10-25 MED ORDER — FERROUS SULFATE 325 (65 FE) MG PO TABS
325.0000 mg | ORAL_TABLET | Freq: Every day | ORAL | Status: DC
  Administered 2014-10-26: 325 mg via ORAL
  Filled 2014-10-25: qty 1

## 2014-10-25 MED ORDER — ATORVASTATIN CALCIUM 40 MG PO TABS
40.0000 mg | ORAL_TABLET | Freq: Every day | ORAL | Status: DC
  Administered 2014-10-25: 40 mg via ORAL
  Filled 2014-10-25: qty 1

## 2014-10-25 MED ORDER — NEOSTIGMINE METHYLSULFATE 10 MG/10ML IV SOLN
INTRAVENOUS | Status: DC | PRN
  Administered 2014-10-25: 4 mg via INTRAVENOUS

## 2014-10-25 MED ORDER — LACTATED RINGERS IV SOLN
INTRAVENOUS | Status: DC
  Administered 2014-10-25: 10:00:00 via INTRAVENOUS

## 2014-10-25 MED ORDER — MIDAZOLAM HCL 2 MG/2ML IJ SOLN
INTRAMUSCULAR | Status: AC
  Filled 2014-10-25: qty 2

## 2014-10-25 MED ORDER — PROPOFOL 10 MG/ML IV BOLUS
INTRAVENOUS | Status: AC
  Filled 2014-10-25: qty 20

## 2014-10-25 MED ORDER — PHENYLEPHRINE 40 MCG/ML (10ML) SYRINGE FOR IV PUSH (FOR BLOOD PRESSURE SUPPORT)
PREFILLED_SYRINGE | INTRAVENOUS | Status: AC
  Filled 2014-10-25: qty 10

## 2014-10-25 MED ORDER — ACETAMINOPHEN 325 MG PO TABS: 650.0000 mg | ORAL_TABLET | Freq: Four times a day (QID) | ORAL | Status: DC | PRN

## 2014-10-25 MED ORDER — BACLOFEN 10 MG PO TABS
5.0000 mg | ORAL_TABLET | Freq: Two times a day (BID) | ORAL | Status: DC
  Administered 2014-10-26: 5 mg via ORAL
  Filled 2014-10-25 (×2): qty 1

## 2014-10-25 MED ORDER — ONDANSETRON HCL 4 MG/2ML IJ SOLN
INTRAMUSCULAR | Status: DC | PRN
  Administered 2014-10-25: 4 mg via INTRAVENOUS

## 2014-10-25 MED ORDER — GLYCOPYRROLATE 0.2 MG/ML IJ SOLN
INTRAMUSCULAR | Status: DC | PRN
  Administered 2014-10-25: .5 mg via INTRAVENOUS

## 2014-10-25 MED ORDER — ETOMIDATE 2 MG/ML IV SOLN
INTRAVENOUS | Status: DC | PRN
  Administered 2014-10-25: 20 mg via INTRAVENOUS

## 2014-10-25 MED ORDER — FENTANYL CITRATE 0.05 MG/ML IJ SOLN
INTRAMUSCULAR | Status: AC
  Filled 2014-10-25: qty 5

## 2014-10-25 MED ORDER — ESMOLOL HCL 10 MG/ML IV SOLN
INTRAVENOUS | Status: DC | PRN
  Administered 2014-10-25: 20 mg via INTRAVENOUS
  Administered 2014-10-25 (×2): 10 mg via INTRAVENOUS

## 2014-10-25 MED ORDER — ROCURONIUM BROMIDE 100 MG/10ML IV SOLN
INTRAVENOUS | Status: DC | PRN
  Administered 2014-10-25: 15 mg via INTRAVENOUS

## 2014-10-25 MED ORDER — 0.9 % SODIUM CHLORIDE (POUR BTL) OPTIME
TOPICAL | Status: DC | PRN
  Administered 2014-10-25: 1000 mL

## 2014-10-25 MED ORDER — MORPHINE SULFATE 2 MG/ML IJ SOLN
1.0000 mg | INTRAMUSCULAR | Status: DC | PRN
  Administered 2014-10-26: 1 mg via INTRAVENOUS
  Filled 2014-10-25: qty 1

## 2014-10-25 MED ORDER — LACTATED RINGERS IV SOLN
INTRAVENOUS | Status: DC | PRN
  Administered 2014-10-25: 10:00:00 via INTRAVENOUS

## 2014-10-25 MED ORDER — LABETALOL HCL 5 MG/ML IV SOLN
INTRAVENOUS | Status: AC
  Administered 2014-10-25: 5 mg
  Filled 2014-10-25: qty 4

## 2014-10-25 MED ORDER — SUCCINYLCHOLINE CHLORIDE 20 MG/ML IJ SOLN
INTRAMUSCULAR | Status: DC | PRN
  Administered 2014-10-25: 100 mg via INTRAVENOUS

## 2014-10-25 MED ORDER — ONDANSETRON HCL 4 MG/2ML IJ SOLN
INTRAMUSCULAR | Status: AC
  Filled 2014-10-25: qty 2

## 2014-10-25 MED ORDER — OXYCODONE-ACETAMINOPHEN 5-325 MG PO TABS
1.0000 | ORAL_TABLET | ORAL | Status: DC | PRN
  Administered 2014-10-25 – 2014-10-26 (×2): 2 via ORAL
  Filled 2014-10-25 (×2): qty 2

## 2014-10-25 MED ORDER — SODIUM CHLORIDE 0.9 % IR SOLN
Status: DC | PRN
  Administered 2014-10-25: 1000 mL

## 2014-10-25 MED ORDER — OXYCODONE HCL 5 MG PO TABS
ORAL_TABLET | ORAL | Status: AC
  Filled 2014-10-25: qty 1

## 2014-10-25 MED ORDER — OXYCODONE HCL 5 MG PO TABS: 5.0000 mg | ORAL_TABLET | ORAL | Status: DC | PRN

## 2014-10-25 MED ORDER — MIDAZOLAM HCL 5 MG/5ML IJ SOLN
INTRAMUSCULAR | Status: DC | PRN
  Administered 2014-10-25 (×2): 1 mg via INTRAVENOUS

## 2014-10-25 MED ORDER — BUPIVACAINE-EPINEPHRINE 0.25% -1:200000 IJ SOLN
INTRAMUSCULAR | Status: DC | PRN
  Administered 2014-10-25: 20 mL

## 2014-10-25 MED ORDER — ROCURONIUM BROMIDE 50 MG/5ML IV SOLN
INTRAVENOUS | Status: AC
  Filled 2014-10-25: qty 1

## 2014-10-25 MED ORDER — IBUPROFEN 600 MG PO TABS: 600.0000 mg | ORAL_TABLET | Freq: Four times a day (QID) | ORAL | Status: DC | PRN

## 2014-10-25 MED ORDER — SODIUM CHLORIDE 0.9 % IV SOLN
INTRAVENOUS | Status: DC | PRN
  Administered 2014-10-25: 1 mL

## 2014-10-25 MED ORDER — ADULT MULTIVITAMIN W/MINERALS CH
1.0000 | ORAL_TABLET | Freq: Every day | ORAL | Status: DC
  Administered 2014-10-25 – 2014-10-26 (×2): 1 via ORAL
  Filled 2014-10-25 (×3): qty 1

## 2014-10-25 SURGICAL SUPPLY — 38 items
APPLIER CLIP 5 13 M/L LIGAMAX5 (MISCELLANEOUS) ×3
BLADE SURG CLIPPER 3M 9600 (MISCELLANEOUS) IMPLANT
CANISTER SUCTION 2500CC (MISCELLANEOUS) ×3 IMPLANT
CHLORAPREP W/TINT 26ML (MISCELLANEOUS) ×3 IMPLANT
CLIP APPLIE 5 13 M/L LIGAMAX5 (MISCELLANEOUS) ×1 IMPLANT
COVER MAYO STAND STRL (DRAPES) ×3 IMPLANT
COVER SURGICAL LIGHT HANDLE (MISCELLANEOUS) ×3 IMPLANT
DRAPE C-ARM 42X72 X-RAY (DRAPES) ×3 IMPLANT
DRAPE LAPAROSCOPIC ABDOMINAL (DRAPES) ×3 IMPLANT
ELECT REM PT RETURN 9FT ADLT (ELECTROSURGICAL) ×3
ELECTRODE REM PT RTRN 9FT ADLT (ELECTROSURGICAL) ×1 IMPLANT
GLOVE BIOGEL PI IND STRL 7.0 (GLOVE) ×2 IMPLANT
GLOVE BIOGEL PI INDICATOR 7.0 (GLOVE) ×4
GLOVE SURG SIGNA 7.5 PF LTX (GLOVE) ×3 IMPLANT
GLOVE SURG SS PI 7.0 STRL IVOR (GLOVE) ×6 IMPLANT
GOWN STRL REUS W/ TWL LRG LVL3 (GOWN DISPOSABLE) ×2 IMPLANT
GOWN STRL REUS W/ TWL XL LVL3 (GOWN DISPOSABLE) ×1 IMPLANT
GOWN STRL REUS W/TWL LRG LVL3 (GOWN DISPOSABLE) ×4
GOWN STRL REUS W/TWL XL LVL3 (GOWN DISPOSABLE) ×2
HEMOSTAT SNOW SURGICEL 2X4 (HEMOSTASIS) ×3 IMPLANT
KIT BASIN OR (CUSTOM PROCEDURE TRAY) ×3 IMPLANT
KIT ROOM TURNOVER OR (KITS) ×3 IMPLANT
LIQUID BAND (GAUZE/BANDAGES/DRESSINGS) ×3 IMPLANT
NS IRRIG 1000ML POUR BTL (IV SOLUTION) ×3 IMPLANT
PAD ARMBOARD 7.5X6 YLW CONV (MISCELLANEOUS) ×3 IMPLANT
POUCH SPECIMEN RETRIEVAL 10MM (ENDOMECHANICALS) ×3 IMPLANT
SCISSORS LAP 5X35 DISP (ENDOMECHANICALS) ×3 IMPLANT
SET CHOLANGIOGRAPH 5 50 .035 (SET/KITS/TRAYS/PACK) ×3 IMPLANT
SET IRRIG TUBING LAPAROSCOPIC (IRRIGATION / IRRIGATOR) ×3 IMPLANT
SLEEVE ENDOPATH XCEL 5M (ENDOMECHANICALS) ×6 IMPLANT
SPECIMEN JAR SMALL (MISCELLANEOUS) ×3 IMPLANT
SUT MON AB 4-0 PC3 18 (SUTURE) ×3 IMPLANT
TOWEL OR 17X24 6PK STRL BLUE (TOWEL DISPOSABLE) ×3 IMPLANT
TOWEL OR 17X26 10 PK STRL BLUE (TOWEL DISPOSABLE) ×3 IMPLANT
TRAY LAPAROSCOPIC (CUSTOM PROCEDURE TRAY) ×3 IMPLANT
TROCAR XCEL BLUNT TIP 100MML (ENDOMECHANICALS) ×3 IMPLANT
TROCAR XCEL NON-BLD 5MMX100MML (ENDOMECHANICALS) ×3 IMPLANT
TUBING INSUFFLATION (TUBING) ×3 IMPLANT

## 2014-10-25 NOTE — Op Note (Signed)
NAMEYOVAN, LEEMAN NO.:  [id number]  MEDICAL RECORD NO.:  [id number]  LOCATION:  6N24C                        FACILITY:  [facility]  PHYSICIAN:  Coralie Keens, M.D. DATE OF BIRTH:  November 11, 1940  DATE OF PROCEDURE:  10/25/2014 DATE OF DISCHARGE:                              OPERATIVE REPORT   PREOPERATIVE DIAGNOSES:  Cholecystitis with cholelithiasis.  POSTOPERATIVE DIAGNOSES:  Cholecystitis with cholelithiasis.  PROCEDURE:  Laparoscopic cholecystectomy.  SURGEON:  Coralie Keens, MD  ANESTHESIA:  General endotracheal anesthesia and 0.25% Marcaine.  ESTIMATED BLOOD LOSS:  Minimal.  INDICATIONS:  This is a 74 year old gentleman who presented in January with acute cholecystitis and choledocholithiasis.  Because of his chronic medical conditions, the decision was made to proceed with Interventional Radiology placing a percutaneous cholecystostomy tube. He eventually underwent ERCP and sphincterotomy and stone removal from the bile duct.  I recently removed the percutaneous cholecystostomy tube.  He was cleared by Cardiology for surgery and now we are proceeding to the operating room for lap chole.  PROCEDURE IN DETAIL:  The patient was brought to the operating room, identified as [employer].  He was placed supine on the operating room table and general anesthesia was induced.  His abdomen was then prepped and draped in usual sterile fashion.  I made a small incision just below the umbilicus with a scalpel.  I took this down to the fascia which was then opened with a scalpel as well.  A hemostat was then used to pass into the peritoneal cavity under direct vision.  A 0 Vicryl pursestring suture was placed around the fascial opening.  The Hasson port was placed through the opening and insufflation of the abdomen was begun.  A 5-mm port was then placed in the patient's epigastrium and two more in the right upper quadrant all under direct vision.   Several adhesions to the gallbladder were then taken down bluntly.  The gallbladder was very thick-walled and firm.  I was able to grasp and retract above the liver bed.  The cystic duct was identified and found to be slightly dilated and foreshortened.  I was able to dissect it out completely and achieve a critical window around it.  I then clipped 4 times proximally and once distally and transected it.  The cystic artery and the posterior branch were identified and clipped proximally and distally and transected as well.  I then slowly had to carve the gallbladder out of the liver bed with the electrocautery.  Once this was free from liver bed, I was able to achieve hemostasis with the cautery as well as a piece of Surgicel SNoW for hemostasis.  The gallbladder was then placed in an Endosac and removed through the incision at the umbilicus.  I then copiously irrigated the abdomen with normal saline. Again, hemostasis appeared to be achieved.  All ports were removed under direct vision, and the abdomen was deflated.  The 0 Vicryl at the umbilicus was tied in place closing the fascial defect.  All incisions were then anesthetized with Marcaine and closed with 4-0 Monocryl subcuticular sutures.  Skin glue was then applied. The patient tolerated the procedure well.  All the counts were correct  at the end of the procedure.  The patient was then extubated in the operating room and taken in stable condition to recovery room.     Coralie Keens, M.D.     DB/MEDQ  D:  10/25/2014  T:  10/25/2014  Job:  [id number]

## 2014-10-25 NOTE — Interval H&P Note (Signed)
History and Physical Interval Note: no change in H and P  10/25/2014 9:00 AM  Jaime [name]  has presented today for surgery, with the diagnosis of Cholecystitis  The various methods of treatment have been discussed with the patient and family. After consideration of risks, benefits and other options for treatment, the patient has consented to  Procedure(s): LAPAROSCOPIC CHOLECYSTECTOMY WITH POSSIBLE INTRAOPERATIVE CHOLANGIOGRAM (N/[name]) as [name] surgical intervention .  The patient's history has been reviewed, patient examined, no change in status, stable for surgery.  I have reviewed the patient's chart and labs.  Questions were answered to the patient's satisfaction.     Jaime [name]

## 2014-10-25 NOTE — Transfer of Care (Signed)
Immediate Anesthesia Transfer of Care Note  Patient: Jaime [name]  Procedure(s) Performed: Procedure(s): LAPAROSCOPIC CHOLECYSTECTOMY  (N/A)  Patient Location: PACU  Anesthesia Type:General  Level of Consciousness: awake, alert , oriented and patient cooperative  Airway & Oxygen Therapy: Patient Spontanous Breathing and Patient connected to nasal cannula oxygen  Post-op Assessment: Report given to RN and Post -op Vital signs reviewed and stable  Post vital signs: Reviewed and stable  Last Vitals:  Filed Vitals:   10/25/14 0901  BP: 176/100  Pulse: 76  Temp: 36.8 C  Resp: 20    Complications: No apparent anesthesia complications

## 2014-10-25 NOTE — Op Note (Signed)
LAPAROSCOPIC CHOLECYSTECTOMY   Procedure Note  KERRON SEDANO 10/25/2014   Pre-op Diagnosis: Cholecystitis     Post-op Diagnosis: same  Procedure(s): LAPAROSCOPIC CHOLECYSTECTOMY   Surgeon(s): Coralie Keens, MD  Anesthesia: General  Staff:  Circulator: Beverlee Nims, RN Scrub Person: Jesse Sans, CST; Montel Culver, RN Circulator Assistant: Montel Culver, RN  Estimated Blood Loss: Minimal               Specimens: sent to path          [facility]   Date: 10/25/2014  Time: 11:27 AM

## 2014-10-25 NOTE — Telephone Encounter (Signed)
Caller name:Vicki Simmons-[facility] Relation to pt: Call back [phone] Pharmacy:  Reason for call: [facility] is needing a order for pt to receive occupational therapy from feb 23, states they are not seeing the pt in the home in more but need the order for compliance and to bill insurance.  Will fax over the documentation today

## 2014-10-25 NOTE — Progress Notes (Signed)
Dr Marcie Bal updated by CRNA T. Berry re BP 207-213? 99-111. New ord for IV Labetelol.  Will cont to monitor closely

## 2014-10-25 NOTE — Progress Notes (Signed)
Family has been in to  See pt-aware of pain issues, pt O2sat 87-88% when trialed on RA.  Dr [name] (in or) updated by speakerphone), pt to be adm for overnite OBS-he will put orders in when done in surgery. Family aware.

## 2014-10-25 NOTE — Anesthesia Preprocedure Evaluation (Addendum)
Anesthesia Evaluation  Patient identified by MRN, date of birth, ID band Patient awake    Reviewed: Allergy & Precautions, NPO status , Patient's Chart, lab work & pertinent test results  Airway Mallampati: II   Neck ROM: full    Dental   Pulmonary shortness of breath, former smoker,  breath sounds clear to auscultation        Cardiovascular hypertension, + CAD, + Past MI, + Peripheral Vascular Disease and +CHF Rhythm:regular Rate:Normal  EF 45% per ECHO 2008 //  EF 25%, echo, August, 2013 // Echo (8/15): Mild LVH, EF 30-35%, ant-lat and lat AK, inf HK, Gr 1 DD, mild MR, mild LAE  Cleared by Dr Ron Parker for surgery.   Neuro/Psych  Neuromuscular disease    GI/Hepatic   Endo/Other  diabetes, Type 2  Renal/GU      Musculoskeletal   Abdominal   Peds  Hematology   Anesthesia Other Findings   Reproductive/Obstetrics                            Anesthesia Physical Anesthesia Plan  ASA: III  Anesthesia Plan: General   Post-op Pain Management:    Induction: Intravenous  Airway Management Planned: Oral ETT  Additional Equipment:   Intra-op Plan:   Post-operative Plan: Extubation in OR  Informed Consent: I have reviewed the patients History and Physical, chart, labs and discussed the procedure including the risks, benefits and alternatives for the proposed anesthesia with the patient or authorized representative who has indicated his/her understanding and acceptance.     Plan Discussed with: CRNA, Anesthesiologist and Surgeon  Anesthesia Plan Comments:         Anesthesia Quick Evaluation

## 2014-10-25 NOTE — Telephone Encounter (Signed)
Noted  

## 2014-10-25 NOTE — Progress Notes (Signed)
Pt is post op Lap chole had a temp of 101.9 at 2200; pt unable to perform incentive spirometer; Gave 2 tabs of Percocet for pain; Pt has hx of afib HR=69-124; Manual BP=112/78. Dr.Byerly made aware,Tylenol 650mg  PRN q6h for fever has been given. Will recheck Temp.

## 2014-10-25 NOTE — Discharge Instructions (Signed)
[facility] ______[facility] °LAPAROSCOPIC SURGERY: POST OP INSTRUCTIONS °Always review your discharge instruction sheet given to you by the facility where your surgery was performed. °IF YOU HAVE DISABILITY OR FAMILY LEAVE FORMS, YOU MUST BRING THEM TO THE OFFICE FOR PROCESSING.   °DO NOT GIVE THEM TO YOUR DOCTOR. ° °1. A prescription for pain medication may be given to you upon discharge.  Take your pain medication as prescribed, if needed.  If narcotic pain medicine is not needed, then you may take acetaminophen (Tylenol) or ibuprofen (Advil) as needed. °2. Take your usually prescribed medications unless otherwise directed. °3. If you need a refill on your pain medication, please contact your pharmacy.  They will contact our office to request authorization. Prescriptions will not be filled after 5pm or on week-ends. °4. You should follow a light diet the first few days after arrival home, such as soup and crackers, etc.  Be sure to include lots of fluids daily. °5. Most patients will experience some swelling and bruising in the area of the incisions.  Ice packs will help.  Swelling and bruising can take several days to resolve.  °6. It is common to experience some constipation if taking pain medication after surgery.  Increasing fluid intake and taking a stool softener (such as Colace) will usually help or prevent this problem from occurring.  A mild laxative (Milk of Magnesia or Miralax) should be taken according to package instructions if there are no bowel movements after 48 hours. °7. Unless discharge instructions indicate otherwise, you may remove your bandages 24-48 hours after surgery, and you may shower at that time.  You may have steri-strips (small skin tapes) in place directly over the incision.  These strips should be left on the skin for 7-10 days.  If your surgeon used skin glue on the incision, you may shower in 24 hours.  The glue will flake off over the next 2-3 weeks.  Any sutures or  staples will be removed at the office during your follow-up visit. °8. ACTIVITIES:  You may resume regular (light) daily activities beginning the next day--such as daily self-care, walking, climbing stairs--gradually increasing activities as tolerated.  You may have sexual intercourse when it is comfortable.  Refrain from any heavy lifting or straining until approved by your doctor. °a. You may drive when you are no longer taking prescription pain medication, you can comfortably wear a seatbelt, and you can safely maneuver your car and apply brakes. °b. RETURN TO WORK:  __________________________________________________________ °9. You should see your doctor in the office for a follow-up appointment approximately 2-3 weeks after your surgery.  Make sure that you call for this appointment within a day or two after you arrive home to insure a convenient appointment time. °10. OTHER INSTRUCTIONS: __________________________________________________________________________________________________________________________ __________________________________________________________________________________________________________________________ °WHEN TO CALL YOUR DOCTOR: °1. Fever over 101.0 °2. Inability to urinate °3. Continued bleeding from incision. °4. Increased pain, redness, or drainage from the incision. °5. Increasing abdominal pain ° °The clinic staff is available to answer your questions during regular business hours.  Please don’t hesitate to call and ask to speak to one of the nurses for clinical concerns.  If you have a medical emergency, go to the nearest emergency room or call 911.  A surgeon from [facility] is always on call at the hospital. °[address], [address], [place], [place]  [address] ? [address] [address], [place], [place]   [address] °[phone] ? [phone] ? FAX [phone] °Web site:   [email or web address] °

## 2014-10-26 ENCOUNTER — Encounter (HOSPITAL_COMMUNITY): Payer: Self-pay | Admitting: Surgery

## 2014-10-26 DIAGNOSIS — E119 Type 2 diabetes mellitus without complications: Secondary | ICD-10-CM | POA: Diagnosis not present

## 2014-10-26 DIAGNOSIS — I509 Heart failure, unspecified: Secondary | ICD-10-CM | POA: Diagnosis not present

## 2014-10-26 DIAGNOSIS — E780 Pure hypercholesterolemia: Secondary | ICD-10-CM | POA: Diagnosis not present

## 2014-10-26 DIAGNOSIS — K8012 Calculus of gallbladder with acute and chronic cholecystitis without obstruction: Secondary | ICD-10-CM | POA: Diagnosis not present

## 2014-10-26 DIAGNOSIS — I10 Essential (primary) hypertension: Secondary | ICD-10-CM | POA: Diagnosis not present

## 2014-10-26 DIAGNOSIS — I4891 Unspecified atrial fibrillation: Secondary | ICD-10-CM | POA: Diagnosis not present

## 2014-10-26 LAB — COMPREHENSIVE METABOLIC PANEL
ALT: 55 U/L — AB (ref 0–53)
AST: 67 U/L — AB (ref 0–37)
Albumin: 2.7 g/dL — ABNORMAL LOW (ref 3.5–5.2)
Alkaline Phosphatase: 50 U/L (ref 39–117)
Anion gap: 8 (ref 5–15)
BILIRUBIN TOTAL: 1.3 mg/dL — AB (ref 0.3–1.2)
BUN: 22 mg/dL (ref 6–23)
CHLORIDE: 107 mmol/L (ref 96–112)
CO2: 21 mmol/L (ref 19–32)
CREATININE: 0.95 mg/dL (ref 0.50–1.35)
Calcium: 8.4 mg/dL (ref 8.4–10.5)
GFR calc Af Amer: >90 mL/min (ref >90)
GFR calc non Af Amer: 81 mL/min — ABNORMAL LOW (ref >90)
Glucose, Bld: 240 mg/dL — ABNORMAL HIGH (ref 70–99)
Potassium: 4.3 mmol/L (ref 3.5–5.1)
SODIUM: 136 mmol/L (ref 135–145)
Total Protein: 6.6 g/dL (ref 6.0–8.3)

## 2014-10-26 LAB — CBC
HCT: 36.3 % — ABNORMAL LOW (ref 39.0–52.0)
Hemoglobin: 12 g/dL — ABNORMAL LOW (ref 13.0–17.0)
MCH: 25.8 pg — AB (ref 26.0–34.0)
MCHC: 33.1 g/dL (ref 30.0–36.0)
MCV: 78.1 fL (ref 78.0–100.0)
Platelets: 173 10*3/uL (ref 150–400)
RBC: 4.65 MIL/uL (ref 4.22–5.81)
RDW: 17.3 % — AB (ref 11.5–15.5)
WBC: 13.7 10*3/uL — AB (ref 4.0–10.5)

## 2014-10-26 MED ORDER — SODIUM CHLORIDE 0.9 % IV BOLUS (SEPSIS)
500.0000 mL | Freq: Once | INTRAVENOUS | Status: AC
  Administered 2014-10-26: 500 mL via INTRAVENOUS

## 2014-10-26 NOTE — Progress Notes (Signed)
UR completed 

## 2014-10-26 NOTE — Progress Notes (Signed)
Patient's UOP since 2300 was 100cc's. Bladder scan showed 111cc's.  Dr. Barry Dienes paged.

## 2014-10-26 NOTE — Discharge Summary (Signed)
Physician Discharge Summary  Patient ID: Jaime [name] MRN: [id number] DOB/AGE: Apr 17, 1941 74 y.o.  Admit date: 10/25/2014 Discharge date: 10/26/2014  Admission Diagnoses:  Discharge Diagnoses:  Active Problems:   S/P laparoscopic cholecystectomy   Discharged Condition: good  Hospital Course: admitted post op for pain control.  Post op fever as expected.  Felt much better POD 1.  Tolerating po.  UOP improved.  Discharged home at patient request  Consults: None  Significant Diagnostic Studies:   Treatments: surgery: lap chole  Discharge Exam: Blood pressure 110/72, pulse 94, temperature 98.4 F (36.9 C), temperature source Oral, resp. rate 20, height 5' (1.524 m), weight 66.225 kg (146 lb), SpO2 97 %. General appearance: alert, cooperative and no distress Resp: clear to auscultation bilaterally Cardio: regular rate and rhythm, S1, S2 normal, no murmur, click, rub or gallop Incision/Wound:abdomen soft, incisions clean  Disposition: 63-Long Term Care     Medication List    STOP taking these medications        feeding supplement (PRO-STAT SUGAR FREE 64) Liqd     mupirocin ointment 2 %  Commonly known as:  BACTROBAN      TAKE these medications        aspirin EC 325 MG tablet  Take 325 mg by mouth daily.     atorvastatin 40 MG tablet  Commonly known as:  LIPITOR  TAKE 1 TABLET (40 MG TOTAL) BY MOUTH AT BEDTIME.     baclofen 10 MG tablet  Commonly known as:  LIORESAL  TAKE 1/2 TABLET BY MOUTH TWICE A DAY     diclofenac sodium 1 % Gel  Commonly known as:  VOLTAREN  Apply 1 application topically 2 (two) times daily as needed (pain). For pain     ferrous sulfate 325 (65 FE) MG tablet  Take 325 mg by mouth daily with breakfast.     GLUCAGEN 1 MG Solr injection  Generic drug:  glucagon  Inject 1 mg into the vein once as needed for low blood sugar.     insulin aspart protamine- aspart (70-30) 100 UNIT/ML injection  Commonly known as:  NOVOLOG MIX 70/30   Inject 0.05 mLs (5 Units total) into the skin 2 (two) times daily with a meal.     lisinopril 10 MG tablet  Commonly known as:  PRINIVIL,ZESTRIL  TAKE 1 TABLET BY MOUTH EVERY DAY     metoprolol succinate 25 MG 24 hr tablet  Commonly known as:  TOPROL-XL  Take 1 tablet (25 mg total) by mouth daily.     multivitamin with minerals tablet  Take 1 tablet by mouth daily.     oxyCODONE-acetaminophen 5-325 MG per tablet  Commonly known as:  PERCOCET/ROXICET  Take 1-2 tablets by mouth every 6 (six) hours as needed for moderate pain (for abdominal pain.).     pantoprazole 40 MG tablet  Commonly known as:  PROTONIX  TAKE 1 TABLET BY MOUTH EVERY DAY AT 12 NOON           Follow-up Information    Follow up with [facility], MD. Schedule an appointment as soon as possible for a visit in 3 weeks.   Specialty:  General Surgery   Contact information:   [address] [place] [address] [phone]       Signed: [name] 10/26/2014, 12:08 PM

## 2014-10-26 NOTE — Progress Notes (Signed)
D/c to home with wife via home wheelchair to home. D/c instructions given and explained and rx given and explained.  VSS breathing regular and unlabored on room air. Pain denied

## 2014-10-26 NOTE — Anesthesia Postprocedure Evaluation (Signed)
Anesthesia Post Note  Patient: Jaime [name]  Procedure(s) Performed: Procedure(s) (LRB): LAPAROSCOPIC CHOLECYSTECTOMY  (N/A)  Anesthesia type: General  Patient location: PACU  Post pain: Pain level controlled and Adequate analgesia  Post assessment: Post-op Vital signs reviewed, Patient's Cardiovascular Status Stable, Respiratory Function Stable, Patent Airway and Pain level controlled  Last Vitals:  Filed Vitals:   10/26/14 1041  BP: 110/72  Pulse: 94  Temp: 36.9 C  Resp: 20    Post vital signs: Reviewed and stable  Level of consciousness: awake, alert  and oriented  Complications: No apparent anesthesia complications

## 2014-10-26 NOTE — Progress Notes (Signed)
1 Day Post-Op  Subjective: No complaints this morning. Pain well controlled. Had fever post op  Objective: Vital signs in last 24 hours: Temp:  [97.3 F (36.3 C)-101.9 F (38.8 C)] 99.4 F (37.4 C) (04/06 0608) Pulse Rate:  [55-124] 107 (04/06 0608) Resp:  [10-97] 20 (04/06 0608) BP: (92-214)/(64-117) 92/64 mmHg (04/06 0608) SpO2:  [88 %-100 %] 97 % (04/06 6222)    Intake/Output from previous day: 04/05 0701 - 04/06 0700 In: 1596.7 [P.O.:150; I.V.:1446.7] Out: 290 [Urine:250; Blood:40] Intake/Output this shift: Total I/O In: 120 [P.O.:120] Out: -   Looks well Lungs clear Abdomen soft, non-tender  Lab Results:   Recent Labs  10/24/14 1502  WBC 7.8  HGB 12.0*  HCT 36.5*  PLT 193   BMET  Recent Labs  10/24/14 1502  NA 139  K 3.6  CL 106  CO2 26  GLUCOSE 52*  BUN 14  CREATININE 0.71  CALCIUM 9.1   PT/INR No results for input(s): LABPROT, INR in the last 72 hours. ABG No results for input(s): PHART, HCO3 in the last 72 hours.  Invalid input(s): PCO2, PO2  Studies/Results: No results found.  Anti-infectives: Anti-infectives    Start     Dose/Rate Route Frequency Ordered Stop   10/25/14 0600  ceFAZolin (ANCEF) IVPB 2 g/50 mL premix     2 g 100 mL/hr over 30 Minutes Intravenous On call to O.R. 10/24/14 1438 10/25/14 1022      Assessment/Plan: s/p Procedure(s): LAPAROSCOPIC CHOLECYSTECTOMY  (N/A)  Labs pending.  If labs are ok, hopefully discharge later today.     Ace Bergfeld A 10/26/2014

## 2014-10-27 ENCOUNTER — Telehealth: Payer: Self-pay | Admitting: *Deleted

## 2014-10-27 DIAGNOSIS — S88912D Complete traumatic amputation of left lower leg, level unspecified, subsequent encounter: Principal | ICD-10-CM

## 2014-10-27 DIAGNOSIS — S88911D Complete traumatic amputation of right lower leg, level unspecified, subsequent encounter: Secondary | ICD-10-CM

## 2014-10-27 NOTE — Telephone Encounter (Signed)
Home Health RN referral placed.

## 2014-10-27 NOTE — Telephone Encounter (Signed)
Patient wife states that she is returning your call. Best [phone]

## 2014-10-27 NOTE — Addendum Note (Signed)
Addended by: Leticia Penna A on: 10/27/2014 05:47 PM   Modules accepted: Orders

## 2014-10-27 NOTE — Telephone Encounter (Signed)
Ok to refer to [facility] for eval

## 2014-10-27 NOTE — Telephone Encounter (Signed)
Transition Care Management Follow-up Telephone Call  How have you been since you were released from the hospital? Per wife- "he is real sore, but he is taking his pain medications.  He is eating well, and I noticed he is tolerating clear liquids and eggs."     Do you understand why you were in the hospital? YES    Do you understand the discharge instrcutions? YES  Items Reviewed:  Medications reviewed: YES   Allergies reviewed: YES   Dietary changes reviewed: YES   Referrals reviewed: YES   Functional Questionnaire:   Activities of Daily Living (ADLs):   He states they are independent in the following: feeds himself States they require assistance with the following: turning, dressing, bathing, has MS and bilateral amputation,- wife states she needs help with him    Any transportation issues/concerns?: NO- Wife has a handicap Lucianne Lei    Any patient concerns? YES- need for help at home with ADL's    Confirmed importance and date/time of follow-up visits scheduled: YES - appointment scheduled 11/03/14   Confirmed with patient if condition begins to worsen call PCP or go to the ER.  Patient was given the Call-a-Nurse line [phone]: YES

## 2014-10-27 NOTE — Telephone Encounter (Signed)
Patient's wife expressed concern that patient is a bilateral amputee and since his surgery he has needed assistance with ADL's.  The family normally has assistance (looks like [facility]) with him, and she states the hospital forgot to reorder it after his discharge.    May he have orders to resume Home Health?

## 2014-10-27 NOTE — Telephone Encounter (Signed)
Called [phone] (Home) *Preferred* and left message to return call with specific Home Health needs.

## 2014-10-28 ENCOUNTER — Telehealth: Payer: Self-pay | Admitting: *Deleted

## 2014-10-28 NOTE — Telephone Encounter (Signed)
Notified patient that [facility] Referral was placed.

## 2014-10-29 DIAGNOSIS — M419 Scoliosis, unspecified: Secondary | ICD-10-CM | POA: Diagnosis not present

## 2014-10-29 DIAGNOSIS — G35 Multiple sclerosis: Secondary | ICD-10-CM | POA: Diagnosis not present

## 2014-10-29 DIAGNOSIS — I2510 Atherosclerotic heart disease of native coronary artery without angina pectoris: Secondary | ICD-10-CM | POA: Diagnosis not present

## 2014-10-29 DIAGNOSIS — Z48815 Encounter for surgical aftercare following surgery on the digestive system: Secondary | ICD-10-CM | POA: Diagnosis not present

## 2014-10-29 DIAGNOSIS — E1159 Type 2 diabetes mellitus with other circulatory complications: Secondary | ICD-10-CM | POA: Diagnosis not present

## 2014-10-29 DIAGNOSIS — Z89611 Acquired absence of right leg above knee: Secondary | ICD-10-CM | POA: Diagnosis not present

## 2014-10-29 DIAGNOSIS — R339 Retention of urine, unspecified: Secondary | ICD-10-CM | POA: Diagnosis not present

## 2014-10-29 DIAGNOSIS — Z89612 Acquired absence of left leg above knee: Secondary | ICD-10-CM | POA: Diagnosis not present

## 2014-10-29 DIAGNOSIS — G8190 Hemiplegia, unspecified affecting unspecified side: Secondary | ICD-10-CM | POA: Diagnosis not present

## 2014-10-29 DIAGNOSIS — I5022 Chronic systolic (congestive) heart failure: Secondary | ICD-10-CM | POA: Diagnosis not present

## 2014-10-29 DIAGNOSIS — I10 Essential (primary) hypertension: Secondary | ICD-10-CM | POA: Diagnosis not present

## 2014-10-29 DIAGNOSIS — S88911D Complete traumatic amputation of right lower leg, level unspecified, subsequent encounter: Secondary | ICD-10-CM | POA: Diagnosis not present

## 2014-10-29 DIAGNOSIS — Z87891 Personal history of nicotine dependence: Secondary | ICD-10-CM | POA: Diagnosis not present

## 2014-10-29 DIAGNOSIS — Z794 Long term (current) use of insulin: Secondary | ICD-10-CM | POA: Diagnosis not present

## 2014-10-29 DIAGNOSIS — S88912D Complete traumatic amputation of left lower leg, level unspecified, subsequent encounter: Secondary | ICD-10-CM | POA: Diagnosis not present

## 2014-10-31 ENCOUNTER — Inpatient Hospital Stay (HOSPITAL_COMMUNITY)
Admission: EM | Admit: 2014-10-31 | Discharge: 2014-11-03 | DRG: 908 | Disposition: A | Discharge status: Home / Self Care | Payer: Medicare Other | Attending: Internal Medicine | Admitting: Internal Medicine

## 2014-10-31 ENCOUNTER — Encounter (HOSPITAL_COMMUNITY): Payer: Self-pay

## 2014-10-31 ENCOUNTER — Emergency Department (HOSPITAL_COMMUNITY): Payer: Medicare Other

## 2014-10-31 DIAGNOSIS — Z955 Presence of coronary angioplasty implant and graft: Secondary | ICD-10-CM

## 2014-10-31 DIAGNOSIS — I272 Other secondary pulmonary hypertension: Secondary | ICD-10-CM | POA: Diagnosis present

## 2014-10-31 DIAGNOSIS — I2510 Atherosclerotic heart disease of native coronary artery without angina pectoris: Secondary | ICD-10-CM | POA: Diagnosis present

## 2014-10-31 DIAGNOSIS — K91840 Postprocedural hemorrhage and hematoma of a digestive system organ or structure following a digestive system procedure: Secondary | ICD-10-CM | POA: Diagnosis present

## 2014-10-31 DIAGNOSIS — I70202 Unspecified atherosclerosis of native arteries of extremities, left leg: Secondary | ICD-10-CM | POA: Diagnosis present

## 2014-10-31 DIAGNOSIS — R109 Unspecified abdominal pain: Secondary | ICD-10-CM | POA: Diagnosis present

## 2014-10-31 DIAGNOSIS — Z89611 Acquired absence of right leg above knee: Secondary | ICD-10-CM

## 2014-10-31 DIAGNOSIS — Z87891 Personal history of nicotine dependence: Secondary | ICD-10-CM | POA: Diagnosis not present

## 2014-10-31 DIAGNOSIS — E119 Type 2 diabetes mellitus without complications: Secondary | ICD-10-CM | POA: Diagnosis present

## 2014-10-31 DIAGNOSIS — Z794 Long term (current) use of insulin: Secondary | ICD-10-CM | POA: Diagnosis not present

## 2014-10-31 DIAGNOSIS — I447 Left bundle-branch block, unspecified: Secondary | ICD-10-CM | POA: Diagnosis present

## 2014-10-31 DIAGNOSIS — Z9049 Acquired absence of other specified parts of digestive tract: Secondary | ICD-10-CM | POA: Diagnosis present

## 2014-10-31 DIAGNOSIS — Z89612 Acquired absence of left leg above knee: Secondary | ICD-10-CM | POA: Diagnosis not present

## 2014-10-31 DIAGNOSIS — G4700 Insomnia, unspecified: Secondary | ICD-10-CM | POA: Diagnosis present

## 2014-10-31 DIAGNOSIS — M79609 Pain in unspecified limb: Secondary | ICD-10-CM

## 2014-10-31 DIAGNOSIS — K651 Peritoneal abscess: Secondary | ICD-10-CM | POA: Diagnosis not present

## 2014-10-31 DIAGNOSIS — K2970 Gastritis, unspecified, without bleeding: Secondary | ICD-10-CM | POA: Diagnosis not present

## 2014-10-31 DIAGNOSIS — Z79899 Other long term (current) drug therapy: Secondary | ICD-10-CM | POA: Diagnosis not present

## 2014-10-31 DIAGNOSIS — T8789 Other complications of amputation stump: Secondary | ICD-10-CM

## 2014-10-31 DIAGNOSIS — Z887 Allergy status to serum and vaccine status: Secondary | ICD-10-CM | POA: Diagnosis not present

## 2014-10-31 DIAGNOSIS — I252 Old myocardial infarction: Secondary | ICD-10-CM | POA: Diagnosis not present

## 2014-10-31 DIAGNOSIS — Y838 Other surgical procedures as the cause of abnormal reaction of the patient, or of later complication, without mention of misadventure at the time of the procedure: Secondary | ICD-10-CM | POA: Diagnosis present

## 2014-10-31 DIAGNOSIS — Z86010 Personal history of colonic polyps: Secondary | ICD-10-CM

## 2014-10-31 DIAGNOSIS — R112 Nausea with vomiting, unspecified: Secondary | ICD-10-CM | POA: Diagnosis not present

## 2014-10-31 DIAGNOSIS — I10 Essential (primary) hypertension: Secondary | ICD-10-CM | POA: Diagnosis present

## 2014-10-31 DIAGNOSIS — I5022 Chronic systolic (congestive) heart failure: Secondary | ICD-10-CM | POA: Diagnosis present

## 2014-10-31 DIAGNOSIS — G35 Multiple sclerosis: Secondary | ICD-10-CM | POA: Diagnosis present

## 2014-10-31 DIAGNOSIS — Z91030 Bee allergy status: Secondary | ICD-10-CM

## 2014-10-31 DIAGNOSIS — K828 Other specified diseases of gallbladder: Secondary | ICD-10-CM | POA: Diagnosis not present

## 2014-10-31 DIAGNOSIS — G4730 Sleep apnea, unspecified: Secondary | ICD-10-CM | POA: Diagnosis present

## 2014-10-31 DIAGNOSIS — I255 Ischemic cardiomyopathy: Secondary | ICD-10-CM | POA: Diagnosis not present

## 2014-10-31 DIAGNOSIS — Z9079 Acquired absence of other genital organ(s): Secondary | ICD-10-CM | POA: Diagnosis present

## 2014-10-31 DIAGNOSIS — R55 Syncope and collapse: Secondary | ICD-10-CM | POA: Diagnosis not present

## 2014-10-31 DIAGNOSIS — Z8673 Personal history of transient ischemic attack (TIA), and cerebral infarction without residual deficits: Secondary | ICD-10-CM

## 2014-10-31 DIAGNOSIS — D649 Anemia, unspecified: Secondary | ICD-10-CM | POA: Diagnosis present

## 2014-10-31 DIAGNOSIS — Z833 Family history of diabetes mellitus: Secondary | ICD-10-CM

## 2014-10-31 DIAGNOSIS — M419 Scoliosis, unspecified: Secondary | ICD-10-CM | POA: Diagnosis present

## 2014-10-31 DIAGNOSIS — R110 Nausea: Secondary | ICD-10-CM | POA: Diagnosis not present

## 2014-10-31 DIAGNOSIS — Z95820 Peripheral vascular angioplasty status with implants and grafts: Secondary | ICD-10-CM | POA: Diagnosis not present

## 2014-10-31 DIAGNOSIS — F0390 Unspecified dementia without behavioral disturbance: Secondary | ICD-10-CM | POA: Diagnosis present

## 2014-10-31 DIAGNOSIS — G8190 Hemiplegia, unspecified affecting unspecified side: Secondary | ICD-10-CM | POA: Diagnosis present

## 2014-10-31 DIAGNOSIS — Z7982 Long term (current) use of aspirin: Secondary | ICD-10-CM

## 2014-10-31 DIAGNOSIS — K810 Acute cholecystitis: Secondary | ICD-10-CM | POA: Insufficient documentation

## 2014-10-31 DIAGNOSIS — R1033 Periumbilical pain: Secondary | ICD-10-CM | POA: Diagnosis not present

## 2014-10-31 DIAGNOSIS — Z8249 Family history of ischemic heart disease and other diseases of the circulatory system: Secondary | ICD-10-CM

## 2014-10-31 DIAGNOSIS — E785 Hyperlipidemia, unspecified: Secondary | ICD-10-CM | POA: Diagnosis present

## 2014-10-31 DIAGNOSIS — M25551 Pain in right hip: Secondary | ICD-10-CM | POA: Diagnosis not present

## 2014-10-31 DIAGNOSIS — I6523 Occlusion and stenosis of bilateral carotid arteries: Secondary | ICD-10-CM | POA: Diagnosis present

## 2014-10-31 DIAGNOSIS — Z9889 Other specified postprocedural states: Secondary | ICD-10-CM | POA: Diagnosis not present

## 2014-10-31 DIAGNOSIS — K6389 Other specified diseases of intestine: Secondary | ICD-10-CM | POA: Diagnosis not present

## 2014-10-31 LAB — URINALYSIS, ROUTINE W REFLEX MICROSCOPIC
Bilirubin Urine: NEGATIVE
Glucose, UA: NEGATIVE mg/dL
HGB URINE DIPSTICK: NEGATIVE
Ketones, ur: 40 mg/dL — AB
Nitrite: NEGATIVE
Protein, ur: NEGATIVE mg/dL
SPECIFIC GRAVITY, URINE: 1.022 (ref 1.005–1.030)
Urobilinogen, UA: 1 mg/dL (ref 0.0–1.0)
pH: 6.5 (ref 5.0–8.0)

## 2014-10-31 LAB — COMPREHENSIVE METABOLIC PANEL
ALT: 22 U/L (ref 0–53)
AST: 24 U/L (ref 0–37)
Albumin: 2.7 g/dL — ABNORMAL LOW (ref 3.5–5.2)
Alkaline Phosphatase: 71 U/L (ref 39–117)
Anion gap: 13 (ref 5–15)
BUN: 12 mg/dL (ref 6–23)
CALCIUM: 8.6 mg/dL (ref 8.4–10.5)
CO2: 21 mmol/L (ref 19–32)
Chloride: 104 mmol/L (ref 96–112)
Creatinine, Ser: 0.61 mg/dL (ref 0.50–1.35)
GFR calc Af Amer: >90 mL/min (ref >90)
GFR calc non Af Amer: >90 mL/min (ref >90)
Glucose, Bld: 95 mg/dL (ref 70–99)
Potassium: 4.2 mmol/L (ref 3.5–5.1)
SODIUM: 138 mmol/L (ref 135–145)
TOTAL PROTEIN: 7 g/dL (ref 6.0–8.3)
Total Bilirubin: 1.2 mg/dL (ref 0.3–1.2)

## 2014-10-31 LAB — CBC WITH DIFFERENTIAL/PLATELET
BASOS ABS: 0 10*3/uL (ref 0.0–0.1)
BASOS PCT: 0 % (ref 0–1)
Eosinophils Absolute: 0.1 10*3/uL (ref 0.0–0.7)
Eosinophils Relative: 1 % (ref 0–5)
HCT: 33.6 % — ABNORMAL LOW (ref 39.0–52.0)
Hemoglobin: 10.9 g/dL — ABNORMAL LOW (ref 13.0–17.0)
Lymphocytes Relative: 19 % (ref 12–46)
Lymphs Abs: 1.7 10*3/uL (ref 0.7–4.0)
MCH: 25.3 pg — AB (ref 26.0–34.0)
MCHC: 32.4 g/dL (ref 30.0–36.0)
MCV: 78.1 fL (ref 78.0–100.0)
Monocytes Absolute: 0.7 10*3/uL (ref 0.1–1.0)
Monocytes Relative: 8 % (ref 3–12)
NEUTROS PCT: 72 % (ref 43–77)
Neutro Abs: 6.3 10*3/uL (ref 1.7–7.7)
PLATELETS: 309 10*3/uL (ref 150–400)
RBC: 4.3 MIL/uL (ref 4.22–5.81)
RDW: 16.9 % — AB (ref 11.5–15.5)
WBC: 8.9 10*3/uL (ref 4.0–10.5)

## 2014-10-31 LAB — URINE MICROSCOPIC-ADD ON

## 2014-10-31 LAB — LIPASE, BLOOD: Lipase: 21 U/L (ref 11–59)

## 2014-10-31 LAB — TROPONIN I: Troponin I: 0.05 ng/mL — ABNORMAL HIGH (ref <0.031)

## 2014-10-31 MED ORDER — PIPERACILLIN-TAZOBACTAM 3.375 G IVPB 30 MIN
3.3750 g | Freq: Once | INTRAVENOUS | Status: AC
  Administered 2014-10-31: 3.375 g via INTRAVENOUS
  Filled 2014-10-31: qty 50

## 2014-10-31 MED ORDER — DEXTROSE 50 % IV SOLN
INTRAVENOUS | Status: AC
Start: 2014-10-31 — End: 2014-11-01
  Filled 2014-10-31: qty 50

## 2014-10-31 MED ORDER — IOHEXOL 300 MG/ML  SOLN
25.0000 mL | Freq: Once | INTRAMUSCULAR | Status: AC | PRN
  Administered 2014-10-31: 25 mL via ORAL

## 2014-10-31 MED ORDER — IOHEXOL 300 MG/ML  SOLN
100.0000 mL | Freq: Once | INTRAMUSCULAR | Status: AC | PRN
  Administered 2014-10-31: 100 mL via INTRAVENOUS

## 2014-10-31 MED ORDER — FENTANYL CITRATE 0.05 MG/ML IJ SOLN
50.0000 ug | Freq: Once | INTRAMUSCULAR | Status: AC
  Administered 2014-10-31: 50 ug via INTRAVENOUS
  Filled 2014-10-31: qty 2

## 2014-10-31 NOTE — ED Notes (Signed)
Pt had cholecystectomy on Tuesday and reports sharp shooting pain that begin in the middle lower abdomen and shoots to the epigastric region.  Pt reports he feels like he needs to belch when the waves of pain hit.  The pain is intermittent in nature and pt is pain free once he is able to belch.

## 2014-10-31 NOTE — ED Notes (Signed)
General Surgery at bedside.

## 2014-10-31 NOTE — Progress Notes (Signed)
Called to get report at 2240 just got off the phone with Vicente Males the ED nurse,awaiting pt arrival to floor.Rockie Neighbours RN

## 2014-10-31 NOTE — ED Notes (Signed)
Pt and family updated on delays for CT. Agreeable with plan.

## 2014-10-31 NOTE — ED Provider Notes (Signed)
CSN: [id number]     Arrival date & time 10/31/14  1306 History   First MD Initiated Contact with Patient 10/31/14 1321     Chief Complaint  Patient presents with  . Abdominal Pain     (Consider location/radiation/quality/duration/timing/severity/associated sxs/prior Treatment) Patient is a 74 y.o. male presenting with abdominal pain.  Abdominal Pain Pain location:  Suprapubic Pain quality: aching   Pain radiates to:  Epigastric region Pain severity:  Severe Onset quality:  Gradual Duration:  12 hours Timing:  Intermittent Progression:  Partially resolved Chronicity:  New Context comment:  Cholecystectomy about a week ago Relieved by: belching. Ineffective treatments:  None tried Associated symptoms: constipation (just today.  good bowel movements last several days) and nausea   Associated symptoms: no chest pain, no diarrhea, no fever, no shortness of breath and no vomiting   Associated symptoms comment:  Syncope - early this morning.  Was reportedly nauseated, trying to have a bowel movement when he passed out.    Past Medical History  Diagnosis Date  . Hyperlipidemia   . Cardiomyopathy, ischemic     EF 45% per ECHO 2008  //   EF 25%, echo, August, 2013 //  Echo (8/15):  Mild LVH, EF 30-35%, ant-lat and lat AK, inf HK, Gr 1 DD, mild MR, mild LAE  . Pulmonary hypertension     moderate ECHO Jan 2008  . Systolic heart failure   . Multiple sclerosis [place] -- LAST VISIT 11-20-2010  NOTE W/ CHART  . Lung nodule     resolved 11-2006 CT Chest  . Tobacco abuse     quit   . Increased prostate specific antigen (PSA) velocity   . Urinary retention     dx ~ 2-12, like from [place], now with a catheter, saw urology  . Carotid artery disease     a.  Doppler, February, 2012, 0-39% bilateral,Mild smooth plaque;  b.  Carotid US (8/15):  Bilateral 1-39% ICA >>> F/u 2 years  . Chronic indwelling Foley catheter   . Scoliosis associated with other condition   .  Hemiparesis   . Fatigue SEVERE  . Impotence   . MI, acute, non ST segment elevation 2007    S/P PCI WITH X1 STENT (TAXUS DRUG-ELUTING) LEFT CIRCUMFLEX  . Hypertension   . H/O pleural effusion 2008    POST THORACENTESIS  . History of colon polyps PRECANCEROUS  . Insomnia   . CAD (coronary artery disease) CARDIOLOGIST- DR KATZ-- VISIT 06-05-2011 IN [facility]    non-STEMI, 2007.Marland Kitchenoccluded circumflex.. Taxus stent placed...residual 80% LAD...50% RCA  . Urinary tract infection     hx of  . Atherosclerotic PVD with ulceration     left foot  . PAC (premature atrial contraction)     December, 2013  . CHF (congestive heart failure)   . Diabetes mellitus     INSULIN-DEPENDent  . Dementia   . Constipation   . TIA (transient ischemic attack)   . Sleep apnea   . Neuromuscular disorder     MS   Past Surgical History  Procedure Laterality Date  . Hernia repair  1990    (R)  . Thoracentesis  2008    PLEURAL EFFUSION  . Cystoscopy  07/30/2011    Procedure: CYSTOSCOPY;  Surgeon: Hanley Ben, MD;  [facility];  Service: Urology;  Laterality: N/A;  . Transurethral resection of prostate  07/30/2011    Procedure: TRANSURETHRAL RESECTION OF THE  PROSTATE (TURP);  Surgeon: Hanley Ben, MD;  Location: [facility];  Service: Urology;  Laterality: N/A;  . Femoral-tibial bypass graft  03/11/2012    Procedure: BYPASS GRAFT FEMORAL-TIBIAL ARTERY;  Surgeon: Conrad Sabetha, MD;  Location: [place];  Service: Vascular;  Laterality: Left;  Left Femoral -Tibial trunk bypass, Endarterectomy of Tibial- Peroneal trunk with vein angioplasty.  . Intraoperative arteriogram  03/11/2012    Procedure: INTRA OPERATIVE ARTERIOGRAM;  Surgeon: Conrad Hockinson, MD;  Location: [place];  Service: Vascular;  Laterality: Left;  . Femoral-popliteal bypass graft  03/11/2012    Procedure: BYPASS GRAFT FEMORAL-POPLITEAL ARTERY;  Surgeon: Conrad Greenbush, MD;  Location: [place];  Service: Vascular;  Laterality:  Left;  embolectomy left lower leg  . Amputation  03/17/2012    Procedure: AMPUTATION ABOVE KNEE;  Surgeon: Conrad Atwood, MD;  Location: [place];  Service: Vascular;  Laterality: Left;  . Coronary angioplasty with stent placement  05-01-2006    OCCLUDED CIRCUMFLEX -- TAXUS STENT PLACMENT  AND RESIDUAL 80% LAD,  50% RCA  . Amputation  06/03/2012    Procedure: AMPUTATION ABOVE KNEE;  Surgeon: Conrad , MD;  Location: [place];  Service: Vascular;  Laterality: Right;  . Lower extremity angiogram Bilateral 12/17/2011    Procedure: LOWER EXTREMITY ANGIOGRAM;  Surgeon: Serafina Mitchell, MD;  Location: [facility] CATH LAB;  Service: Cardiovascular;  Laterality: Bilateral;  bil lower extrem angio  . Abdominal angiogram  12/17/2011    Procedure: ABDOMINAL ANGIOGRAM;  Surgeon: Serafina Mitchell, MD;  Location: [facility] CATH LAB;  Service: Cardiovascular;;  . Abdominal aortagram N/A 08/19/2013    Procedure: ABDOMINAL Maxcine Ham;  Surgeon: Conrad , MD;  Location: [facility] CATH LAB;  Service: Cardiovascular;  Laterality: N/A;  . Ercp N/A 08/25/2014    Procedure: ENDOSCOPIC RETROGRADE CHOLANGIOPANCREATOGRAPHY (ERCP);  Surgeon: Ladene Artist, MD;  Location: [facility] ENDOSCOPY;  Service: Endoscopy;  Laterality: N/A;  . 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 Above knee leg amputation Right     05/2012  . Cholecystectomy  10/25/2014    DR [name]  . Cholecystectomy N/A 10/25/2014    Procedure: LAPAROSCOPIC CHOLECYSTECTOMY ;  Surgeon: Coralie Keens, MD;  Location: [facility] OR;  Service: General;  Laterality: N/A;   Family History  Problem Relation Age of Onset  . Hypertension    . Heart attack Mother 38  . Heart disease Mother   . Stroke Mother   . Hyperlipidemia Mother   . Hypertension Mother   . Colon cancer Neg Hx   . Prostate cancer Neg Hx   . COPD Father   . Peripheral vascular disease Father   . Diabetes Brother   . Heart disease Brother   . Hypertension Brother   . Heart attack Brother   . Diabetes Daughter    History   Substance Use Topics  . Smoking status: Former Smoker -- 22 years    Quit date: 07/22/2012  . Smokeless tobacco: Current User     Comment: pt states that he is using E-cigs  . Alcohol Use: No    Review of Systems  Constitutional: Negative for fever.  Respiratory: Negative for shortness of breath.   Cardiovascular: Negative for chest pain.  Gastrointestinal: Positive for nausea, abdominal pain and constipation (just today.  good bowel movements last several days). Negative for vomiting and diarrhea.  All other systems reviewed and are negative.     Allergies  Bee venom and Influenza vaccines  Home Medications  Prior to Admission medications   Medication Sig Start Date End Date Taking? Authorizing Provider  aspirin EC 325 MG tablet Take 325 mg by mouth daily.   Yes Historical Provider, MD  atorvastatin (LIPITOR) 40 MG tablet TAKE 1 TABLET (40 MG TOTAL) BY MOUTH AT BEDTIME.   Yes Colon Branch, MD  baclofen (LIORESAL) 10 MG tablet TAKE 1/2 TABLET BY MOUTH TWICE A DAY 10/21/14  Yes Pieter Partridge, DO  diclofenac sodium (VOLTAREN) 1 % GEL Apply 1 application topically 2 (two) times daily as needed (pain). For pain 03/09/12  Yes Charlett Blake, MD  ferrous sulfate 325 (65 FE) MG tablet Take 325 mg by mouth daily with breakfast.   Yes Historical Provider, MD  insulin aspart protamine- aspart (NOVOLOG MIX 70/30) (70-30) 100 UNIT/ML injection Inject 0.05 mLs (5 Units total) into the skin 2 (two) times daily with a meal. Patient taking differently: Inject 15-45 Units into the skin 2 (two) times daily with a meal. 45 units every AM, 15 units every PM 08/26/14  Yes Orson Eva, MD  lisinopril (PRINIVIL,ZESTRIL) 10 MG tablet TAKE 1 TABLET BY MOUTH EVERY DAY 10/20/14  Yes Colon Branch, MD  metoprolol succinate (TOPROL-XL) 25 MG 24 hr tablet Take 1 tablet (25 mg total) by mouth daily. 08/26/14  Yes Orson Eva, MD  Multiple Vitamins-Minerals (MULTIVITAMIN WITH MINERALS) tablet Take 1 tablet by mouth daily.    Yes Historical Provider, MD  oxyCODONE-acetaminophen (PERCOCET/ROXICET) 5-325 MG per tablet Take 1-2 tablets by mouth every 6 (six) hours as needed for moderate pain (for abdominal pain.). 10/25/14  Yes Coralie Keens, MD  pantoprazole (PROTONIX) 40 MG tablet TAKE 1 TABLET BY MOUTH EVERY DAY AT 12 NOON 07/25/14  Yes Colon Branch, MD  glucagon (GLUCAGEN) 1 MG SOLR injection Inject 1 mg into the vein once as needed for low blood sugar.    Historical Provider, MD   BP 144/77 mmHg  Pulse 84  Temp(Src) 98.8 F (37.1 C) (Oral)  Resp 16  SpO2 100% Physical Exam  Constitutional: He is oriented to person, place, and time. He appears well-developed and well-nourished. No distress.  HENT:  Head: Normocephalic and atraumatic.  Mouth/Throat: Oropharynx is clear and moist.  Eyes: Conjunctivae are normal. Pupils are equal, round, and reactive to light. No scleral icterus.  Neck: Neck supple.  Cardiovascular: Normal rate, regular rhythm, normal heart sounds and intact distal pulses.   No murmur heard. Pulmonary/Chest: Effort normal and breath sounds normal. No stridor. No respiratory distress. He has no wheezes. He has no rales.  Abdominal: Soft. He exhibits no distension. There is tenderness in the right lower quadrant, suprapubic area and left lower quadrant. There is no rigidity, no rebound and no guarding.  Musculoskeletal: Normal range of motion. He exhibits no edema.  BLE amputations  Neurological: He is alert and oriented to person, place, and time.  Skin: Skin is warm and dry. No rash noted.  Psychiatric: He has a normal mood and affect. His behavior is normal.  Nursing note and vitals reviewed.   ED Course  Procedures (including critical care time) Labs Review Labs Reviewed  CBC WITH DIFFERENTIAL/PLATELET - Abnormal; Notable for the following:    Hemoglobin 10.9 (*)    HCT 33.6 (*)    MCH 25.3 (*)    RDW 16.9 (*)    All other components within normal limits  COMPREHENSIVE METABOLIC PANEL  - Abnormal; Notable for the following:    Albumin 2.7 (*)  All other components within normal limits  URINALYSIS, ROUTINE W REFLEX MICROSCOPIC - Abnormal; Notable for the following:    Ketones, ur 40 (*)    Leukocytes, UA SMALL (*)    All other components within normal limits  TROPONIN I - Abnormal; Notable for the following:    Troponin I 0.05 (*)    All other components within normal limits  LIPASE, BLOOD  URINE MICROSCOPIC-ADD ON    Imaging Review Ct Abdomen Pelvis W Contrast  10/31/2014   CLINICAL DATA:  Mid lower abdominal pain radiating to the epigastric region. Cholecystectomy six days prior.  EXAM: CT ABDOMEN AND PELVIS WITH CONTRAST  TECHNIQUE: Multidetector CT imaging of the abdomen and pelvis was performed using the standard protocol following bolus administration of intravenous contrast.  CONTRAST:  12mL OMNIPAQUE IOHEXOL 300 MG/ML  SOLN  COMPARISON:  CT 08/18/2014  FINDINGS: Small right pleural effusion with adjacent compressive atelectasis. Minimal atelectasis in the dependent left lower lobe. Coronary artery calcifications are seen.  Clips in the gallbladder fossa from cholecystectomy. There is an irregular fluid collection within the gallbladder fossa measuring 6.4 x 4.4 x 5.7 cm. This contains heterogeneous material and multiple foci of air, concerning for abscess. The common bile duct measures 6 mm in its midportion, flaring 10 9 mm distally. No calcified choledocholithiasis, however there is suggestion of a 7 mm filling defect in the distal most common bile duct, axial image 41/100.  Small amounts of perihepatic fluid adjacent in the right lobe measures 11 mm. There is a 14 mm hepatic cyst in the left lobe. The spleen is normal. Minimal thickening of both adrenal glands without discrete nodule. No pancreatic ductal dilatation or surrounding inflammatory change. Kidneys demonstrate symmetric enhancement and excretion. There are multiple bilateral renal cysts, unchanged from  prior.  The stomach is physiologically distended. There are no dilated or thickened small bowel loops. The cecum is high-riding, no bowel obstruction with oral contrast in the mid transverse colon. There is no frank colonic wall thickening, soft tissue stranding about the distal rectum appear similar to prior exam.  Atherosclerosis the abdominal aorta and iliac vessels. There is circumferential thrombus that appears similar to prior exam. Right common iliac artery measures 1.8 cm, unchanged.  Within the pelvis the urinary bladder is physiologically distended. Prostate gland appears enlarged causing mass effect on the bladder base. There is no pelvic adenopathy. Left femoral vascular graft, no definite intragraft flow, similar to prior. Small fat containing umbilical hernia unchanged. Minimal soft tissue air in the anterior abdominal wall consistent went recent surgery, no subcutaneous fluid collection.  Stable appearance of the osseous structures with diffuse osteopenia. 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  IMPRESSION: 1. Complex fluid collection in the gallbladder fossa containing heterogeneous material and internal foci of air, concerning for abscess. This measures 6.4 x 4.4 x 5.7 cm per 2. Dilated distal common bowel duct of 9 mm, question of choledocholithiasis distally. 3. Multiple additional chronic findings appear stable.   Electronically Signed   By: Jeb Levering M.D.   On: 10/31/2014 19:33     EKG Interpretation   Date/Time:  Monday October 31 2014 17:35:05 EDT Ventricular Rate:  74 PR Interval:  278 QRS Duration: 122 QT Interval:  424 QTC Calculation: 470 R Axis:   -73 Text Interpretation:  Sinus or ectopic atrial rhythm Prolonged PR interval  Left bundle branch block cmopared to prior, LBBB has replaced incomplete  LBBB Confirmed by [facility]  MD, TREY ([id number]) on 10/31/2014  6:12:18 PM      MDM   Final diagnoses:  Abdominal pain  syncope  74 yo male with recent  cholecystectomy who presents with abdominal pain today.  This morning, also had an episode of syncope associated with nausea and needing to move bowels.  At initial exam, he was well appearing, pain had improved, but still with suprapubic tenderness.  Plan labs, CT to eval for post surgical problems.    CT shows possible abscess in gall bladder fossa.  Also shows dilated distal common bile duct.  However, no leukocytosis and no elevated LFTs.  Troponin minimally elevated (EKG showed LBBB which appeared consistent with prior EKGs.)  Also, no chest pain, no SOB.    Have spoken with General Surgery and Internal Medicine.  Medicine will admit, surgery will consult.    Serita Grit, MD 10/31/14 [phone]

## 2014-10-31 NOTE — Consult Note (Signed)
Reason for Consult:abdominal pain, s/p Lap Chole Referring Physician: Dr Duffy Jaime [name] is an 74 y.o. male.  HPI: 74 yo AAM with multiple chronic medical problems including MS who was previously admitted in late Jan 2016 with acute cholecystitis. He underwent perc cholecystostomy tube placement by IR on 1/29. There was evidence of CBD and he underwent ERCP/sphincterotomy on 2/4 with retrieval of CBD stones. He was followed by Dr [name] as an outpt and ultimately cleared for surgery. He underwent laparoscopic cholecystectomy on 4/5. He was kept overnight for observation. He states he was healing well but awoke this am with periumbilical pain, 1/63, radiating up toward upper abdomen. Had temp of 99. Caregiver reports he was very HTN (bp 170s/80s), and he was clammy. Pt also reports he blacked out for a 2 minutes or so. He also developed nausea. He reports he was eating/drinking normally yesterday. Had decreased appetite today.   Came to ED. Had labs and CT  Past Medical History  Diagnosis Date  . Hyperlipidemia   . Cardiomyopathy, ischemic     EF 45% per ECHO 2008  //   EF 25%, echo, August, 2013 //  Echo (8/15):  Mild LVH, EF 30-35%, ant-lat and lat AK, inf HK, Gr 1 DD, mild MR, mild LAE  . Pulmonary hypertension     moderate ECHO Jan 2008  . Systolic heart failure   . Multiple sclerosis [name] -- LAST VISIT 11-20-2010  NOTE W/ CHART  . Lung nodule     resolved 11-2006 CT Chest  . Tobacco abuse     quit   . Increased prostate specific antigen (PSA) velocity   . Urinary retention     dx ~ 2-12, like from [place], now with a catheter, saw urology  . Carotid artery disease     a.  Doppler, February, 2012, 0-39% bilateral,Mild smooth plaque;  b.  Carotid US (8/15):  Bilateral 1-39% ICA >>> F/u 2 years  . Chronic indwelling Foley catheter   . Scoliosis associated with other condition   . Hemiparesis   . Fatigue SEVERE  . Impotence   . MI, acute, non ST  segment elevation 2007    S/P PCI WITH X1 STENT (TAXUS DRUG-ELUTING) LEFT CIRCUMFLEX  . Hypertension   . H/O pleural effusion 2008    POST THORACENTESIS  . History of colon polyps PRECANCEROUS  . Insomnia   . CAD (coronary artery disease) CARDIOLOGIST- DR KATZ-- VISIT 06-05-2011 IN [facility]    non-STEMI, 2007.Marland Kitchenoccluded circumflex.. Taxus stent placed...residual 80% LAD...50% RCA  . Urinary tract infection     hx of  . Atherosclerotic PVD with ulceration     left foot  . PAC (premature atrial contraction)     December, 2013  . CHF (congestive heart failure)   . Diabetes mellitus     INSULIN-DEPENDent  . Dementia   . Constipation   . TIA (transient ischemic attack)   . Sleep apnea   . Neuromuscular disorder     MS    Past Surgical History  Procedure Laterality Date  . Hernia repair  1990    (R)  . Thoracentesis  2008    PLEURAL EFFUSION  . Cystoscopy  07/30/2011    Procedure: CYSTOSCOPY;  Surgeon: Hanley Ben, MD;  Location: [facility];  Service: Urology;  Laterality: N/A;  . Transurethral resection of prostate  07/30/2011    Procedure: TRANSURETHRAL RESECTION OF THE PROSTATE (TURP);  Surgeon:  Hanley Ben, MD;  Location: [facility];  Service: Urology;  Laterality: N/A;  . Femoral-tibial bypass graft  03/11/2012    Procedure: BYPASS GRAFT FEMORAL-TIBIAL ARTERY;  Surgeon: Conrad [place], MD;  Location: [place];  Service: Vascular;  Laterality: Left;  Left Femoral -Tibial trunk bypass, Endarterectomy of Tibial- Peroneal trunk with vein angioplasty.  . Intraoperative arteriogram  03/11/2012    Procedure: INTRA OPERATIVE ARTERIOGRAM;  Surgeon: Conrad [place], MD;  Location: [place];  Service: Vascular;  Laterality: Left;  . Femoral-popliteal bypass graft  03/11/2012    Procedure: BYPASS GRAFT FEMORAL-POPLITEAL ARTERY;  Surgeon: Conrad Blount, MD;  Location: [place];  Service: Vascular;  Laterality: Left;  embolectomy left lower leg  . Amputation  03/17/2012     Procedure: AMPUTATION ABOVE KNEE;  Surgeon: Conrad Lockridge, MD;  Location: [place];  Service: Vascular;  Laterality: Left;  . Coronary angioplasty with stent placement  05-01-2006    OCCLUDED CIRCUMFLEX -- TAXUS STENT PLACMENT  AND RESIDUAL 80% LAD,  50% RCA  . Amputation  06/03/2012    Procedure: AMPUTATION ABOVE KNEE;  Surgeon: Conrad [place], MD;  Location: [place];  Service: Vascular;  Laterality: Right;  . Lower extremity angiogram Bilateral 12/17/2011    Procedure: LOWER EXTREMITY ANGIOGRAM;  Surgeon: Serafina Mitchell, MD;  Location: [facility] CATH LAB;  Service: Cardiovascular;  Laterality: Bilateral;  bil lower extrem angio  . Abdominal angiogram  12/17/2011    Procedure: ABDOMINAL ANGIOGRAM;  Surgeon: Serafina Mitchell, MD;  Location: [facility] C[facility] LAB;  Service: Cardiovascular;;  . Abdominal aortagram N/A 08/19/2013    Procedure: ABDOMINAL Maxcine Ham;  Surgeon: Conrad [place], MD;  Location: [facility] CATH LAB;  Service: Cardiovascular;  Laterality: N/A;  . Ercp N/A 08/25/2014    Procedure: ENDOSCOPIC RETROGRADE CHOLANGIOPANCREATOGRAPHY (ERCP);  Surgeon: Ladene Artist, MD;  Location: [facility] ENDOSCOPY;  Service: Endoscopy;  Laterality: N/A;  . 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 Above knee leg amputation Right     05/2012  . Cholecystectomy  10/25/2014    DR [name]  . Cholecystectomy N/A 10/25/2014    Procedure: LAPAROSCOPIC CHOLECYSTECTOMY ;  Surgeon: Coralie Keens, MD;  Location: [facility] OR;  Service: General;  Laterality: N/A;    Family History  Problem Relation Age of Onset  . Hypertension    . Heart attack Mother 49  . Heart disease Mother   . Stroke Mother   . Hyperlipidemia Mother   . Hypertension Mother   . Colon cancer Neg Hx   . Prostate cancer Neg Hx   . COPD Father   . Peripheral vascular disease Father   . Diabetes Brother   . Heart disease Brother   . Hypertension Brother   . Heart attack Brother   . Diabetes Daughter     Social History:  reports that he quit smoking about 2 years ago. He  uses smokeless tobacco. He reports that he does not drink alcohol or use illicit drugs.  Allergies:  Allergies  Allergen Reactions  . Bee Venom Anaphylaxis  . Influenza Vaccines Other (See Comments)    Sick for months    Medications: I have reviewed the patient's current medications.  Results for orders placed or performed during the hospital encounter of 10/31/14 (from the past 48 hour(s))  CBC with Differential/Platelet     Status: Abnormal   Collection Time: 10/31/14  3:50 PM  Result Value Ref Range   WBC 8.9 4.0 - 10.5 K/uL  RBC 4.30 4.22 - 5.81 MIL/uL   Hemoglobin 10.9 (L) 13.0 - 17.0 g/dL   HCT 33.6 (L) 39.0 - 52.0 %   MCV 78.1 78.0 - 100.0 fL   MCH 25.3 (L) 26.0 - 34.0 pg   MCHC 32.4 30.0 - 36.0 g/dL   RDW 16.9 (H) 11.5 - 15.5 %   Platelets 309 150 - 400 K/uL   Neutrophils Relative % 72 43 - 77 %   Neutro Abs 6.3 1.7 - 7.7 K/uL   Lymphocytes Relative 19 12 - 46 %   Lymphs Abs 1.7 0.7 - 4.0 K/uL   Monocytes Relative 8 3 - 12 %   Monocytes Absolute 0.7 0.1 - 1.0 K/uL   Eosinophils Relative 1 0 - 5 %   Eosinophils Absolute 0.1 0.0 - 0.7 K/uL   Basophils Relative 0 0 - 1 %   Basophils Absolute 0.0 0.0 - 0.1 K/uL  Comprehensive metabolic panel     Status: Abnormal   Collection Time: 10/31/14  3:50 PM  Result Value Ref Range   Sodium 138 135 - 145 mmol/L   Potassium 4.2 3.5 - 5.1 mmol/L   Chloride 104 96 - 112 mmol/L   CO2 21 19 - 32 mmol/L   Glucose, Bld 95 70 - 99 mg/dL   BUN 12 6 - 23 mg/dL   Creatinine, Ser 0.61 0.50 - 1.35 mg/dL   Calcium 8.6 8.4 - 10.5 mg/dL   Total Protein 7.0 6.0 - 8.3 g/dL   Albumin 2.7 (L) 3.5 - 5.2 g/dL   AST 24 0 - 37 U/L   ALT 22 0 - 53 U/L   Alkaline Phosphatase 71 39 - 117 U/L   Total Bilirubin 1.2 0.3 - 1.2 mg/dL   GFR calc non Af Amer >90 >90 mL/min   GFR calc Af Amer >90 >90 mL/min    Comment: (NOTE) The eGFR has been calculated using the CKD EPI equation. This calculation has not been validated in all clinical  situations. eGFR's persistently <90 mL/min signify possible Chronic Kidney Disease.    Anion gap 13 5 - 15  Urinalysis, Routine w reflex microscopic     Status: Abnormal   Collection Time: 10/31/14  3:50 PM  Result Value Ref Range   Color, Urine YELLOW YELLOW   APPearance CLEAR CLEAR   Specific Gravity, Urine 1.022 1.005 - 1.030   pH 6.5 5.0 - 8.0   Glucose, UA NEGATIVE NEGATIVE mg/dL   Hgb urine dipstick NEGATIVE NEGATIVE   Bilirubin Urine NEGATIVE NEGATIVE   Ketones, ur 40 (A) NEGATIVE mg/dL   Protein, ur NEGATIVE NEGATIVE mg/dL   Urobilinogen, UA 1.0 0.0 - 1.0 mg/dL   Nitrite NEGATIVE NEGATIVE   Leukocytes, UA SMALL (A) NEGATIVE  Lipase, blood     Status: None   Collection Time: 10/31/14  3:50 PM  Result Value Ref Range   Lipase 21 11 - 59 U/L  Troponin I     Status: Abnormal   Collection Time: 10/31/14  3:50 PM  Result Value Ref Range   Troponin I 0.05 (H) <0.031 ng/mL    Comment:        PERSISTENTLY INCREASED TROPONIN VALUES IN THE RANGE OF 0.04-0.49 ng/mL CAN BE SEEN IN:       -UNSTABLE ANGINA       -CONGESTIVE HEART FAILURE       -MYOCARDITIS       -CHEST TRAUMA       -ARRYHTHMIAS       -LATE  PRESENTING MYOCARDIAL INFARCTION       -COPD   CLINICAL FOLLOW-UP RECOMMENDED.   Urine microscopic-add on     Status: None   Collection Time: 10/31/14  3:50 PM  Result Value Ref Range   Squamous Epithelial / LPF RARE RARE   WBC, UA 3-6 <3 WBC/hpf   Bacteria, UA RARE RARE   Urine-Other MUCOUS PRESENT     Comment: FEW YEAST    Ct Abdomen Pelvis W Contrast  10/31/2014   CLINICAL DATA:  Mid lower abdominal pain radiating to the epigastric region. Cholecystectomy six days prior.  EXAM: CT ABDOMEN AND PELVIS WITH CONTRAST  TECHNIQUE: Multidetector CT imaging of the abdomen and pelvis was performed using the standard protocol following bolus administration of intravenous contrast.  CONTRAST:  140mL OMNIPAQUE IOHEXOL 300 MG/ML  SOLN  COMPARISON:  CT 08/18/2014  FINDINGS: Small  right pleural effusion with adjacent compressive atelectasis. Minimal atelectasis in the dependent left lower lobe. Coronary artery calcifications are seen.  Clips in the gallbladder fossa from cholecystectomy. There is an irregular fluid collection within the gallbladder fossa measuring 6.4 x 4.4 x 5.7 cm. This contains heterogeneous material and multiple foci of air, concerning for abscess. The common bile duct measures 6 mm in its midportion, flaring 10 9 mm distally. No calcified choledocholithiasis, however there is suggestion of a 7 mm filling defect in the distal most common bile duct, axial image 41/100.  Small amounts of perihepatic fluid adjacent in the right lobe measures 11 mm. There is a 14 mm hepatic cyst in the left lobe. The spleen is normal. Minimal thickening of both adrenal glands without discrete nodule. No pancreatic ductal dilatation or surrounding inflammatory change. Kidneys demonstrate symmetric enhancement and excretion. There are multiple bilateral renal cysts, unchanged from prior.  The stomach is physiologically distended. There are no dilated or thickened small bowel loops. The cecum is high-riding, no bowel obstruction with oral contrast in the mid transverse colon. There is no frank colonic wall thickening, soft tissue stranding about the distal rectum appear similar to prior exam.  Atherosclerosis the abdominal aorta and iliac vessels. There is circumferential thrombus that appears similar to prior exam. Right common iliac artery measures 1.8 cm, unchanged.  Within the pelvis the urinary bladder is physiologically distended. Prostate gland appears enlarged causing mass effect on the bladder base. There is no pelvic adenopathy. Left femoral vascular graft, no definite intragraft flow, similar to prior. Small fat containing umbilical hernia unchanged. Minimal soft tissue air in the anterior abdominal wall consistent went recent surgery, no subcutaneous fluid collection.  Stable  appearance of the osseous structures with diffuse osteopenia. 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  IMPRESSION: 1. Complex fluid collection in the gallbladder fossa containing heterogeneous material and internal foci of air, concerning for abscess. This measures 6.4 x 4.4 x 5.7 cm per 2. Dilated distal common bowel duct of 9 mm, question of choledocholithiasis distally. 3. Multiple additional chronic findings appear stable.   Electronically Signed   By: Jeb Levering M.D.   On: 10/31/2014 19:33    Review of Systems  Constitutional: Positive for chills. Negative for weight loss.  HENT: Negative for nosebleeds.   Eyes: Negative for blurred vision.  Respiratory: Negative for shortness of breath.   Cardiovascular: Negative for chest pain, palpitations, orthopnea and PND.       Denies DOE  Gastrointestinal: Positive for nausea and abdominal pain.  Genitourinary: Negative for dysuria and hematuria.  Musculoskeletal: Negative.   Skin: Negative  for itching and rash.  Neurological: Negative for dizziness, focal weakness, seizures, loss of consciousness and headaches.       Denies TIAs, amaurosis fugax  Endo/Heme/Allergies: Does not bruise/bleed easily.  Psychiatric/Behavioral: The patient is not nervous/anxious.    Blood pressure 121/83, pulse 73, temperature 98.8 F (37.1 C), temperature source Oral, resp. rate 20, SpO2 97 %. Physical Exam  Vitals reviewed. Constitutional: He is oriented to person, place, and time. He appears well-developed and well-nourished. No distress.  HENT:  Head: Normocephalic and atraumatic.  Right Ear: External ear normal.  Left Ear: External ear normal.  Eyes: Conjunctivae are normal. No scleral icterus.  Neck: Normal range of motion. Neck supple. No tracheal deviation present. No thyromegaly present.  Cardiovascular: Normal rate.   Murmur heard. Respiratory: Effort normal and breath sounds normal. No stridor. No respiratory distress. He has no  wheezes.  GI: Soft. He exhibits no distension. There is no rebound and no guarding.  Healing trocar incisions; some RUQ TTP. No rt/guarding/peritonitis  Genitourinary:  foley  Musculoskeletal: He exhibits no edema or tenderness.  LE amputation  Lymphadenopathy:    He has no cervical adenopathy.  Neurological: He is alert and oriented to person, place, and time. He exhibits normal muscle tone.  Skin: Skin is warm and dry. No rash noted. He is not diaphoretic. No erythema. No pallor.  Psychiatric: He has a normal mood and affect. His behavior is normal. Judgment and thought content normal.    Assessment/Plan: Intra-abdominal fluid collection  S/p delayed lap cholecystectomy 4/5 Anemia HTN MS B/l amputee ICM CAD Pulmonary HTN  His labs are normal except for a drop in hgb. A piece of surgical SNOW was placed in the GB fossa at the time of surgery to help with bleeding. Not sure if this GB fluid collection is postop hematoma or abscess. No fever, no wbc goes against abscess. Drop in hgb suggestive of hematoma. However he did have chronic cholecystitis. Suspicion for bile leak very low.   CBD finding on CT - LFTs normal. Had preop ERCP/sphincterotomy. Suspicion for retained stone low.   rec admit to medicine team given co-morbidities NPO p MN Empiric iv abx Repeat labs in am including LFTs Will ask IR to aspirate fluid collection tomorrow to get better idea of nature of fluid.  Will follow   Leighton Ruff. Redmond Pulling, MD, FACS General, Bariatric, & Minimally Invasive Surgery [facility], [place]   [facility] M 10/31/2014, 9:37 PM

## 2014-10-31 NOTE — ED Notes (Signed)
Pt family took patients belongings home.

## 2014-10-31 NOTE — ED Notes (Signed)
[place] Wife/caretaker phone number

## 2014-11-01 ENCOUNTER — Encounter: Payer: Self-pay | Admitting: Gastroenterology

## 2014-11-01 ENCOUNTER — Encounter (HOSPITAL_COMMUNITY): Payer: Self-pay | Admitting: Internal Medicine

## 2014-11-01 ENCOUNTER — Inpatient Hospital Stay (HOSPITAL_COMMUNITY): Payer: Medicare Other

## 2014-11-01 DIAGNOSIS — K810 Acute cholecystitis: Secondary | ICD-10-CM

## 2014-11-01 DIAGNOSIS — R109 Unspecified abdominal pain: Secondary | ICD-10-CM

## 2014-11-01 DIAGNOSIS — I10 Essential (primary) hypertension: Secondary | ICD-10-CM

## 2014-11-01 DIAGNOSIS — E119 Type 2 diabetes mellitus without complications: Secondary | ICD-10-CM

## 2014-11-01 DIAGNOSIS — R55 Syncope and collapse: Secondary | ICD-10-CM | POA: Diagnosis present

## 2014-11-01 DIAGNOSIS — K651 Peritoneal abscess: Secondary | ICD-10-CM

## 2014-11-01 LAB — GLUCOSE, CAPILLARY
GLUCOSE-CAPILLARY: 63 mg/dL — AB (ref 70–99)
Glucose-Capillary: 110 mg/dL — ABNORMAL HIGH (ref 70–99)
Glucose-Capillary: 160 mg/dL — ABNORMAL HIGH (ref 70–99)
Glucose-Capillary: 74 mg/dL (ref 70–99)
Glucose-Capillary: 82 mg/dL (ref 70–99)
Glucose-Capillary: 85 mg/dL (ref 70–99)
Glucose-Capillary: 96 mg/dL (ref 70–99)

## 2014-11-01 LAB — PROTIME-INR
INR: 1.17 (ref 0.00–1.49)
PROTHROMBIN TIME: 15.1 s (ref 11.6–15.2)

## 2014-11-01 LAB — TROPONIN I
Troponin I: 0.04 ng/mL — ABNORMAL HIGH (ref <0.031)
Troponin I: 0.04 ng/mL — ABNORMAL HIGH (ref <0.031)

## 2014-11-01 MED ORDER — PANTOPRAZOLE SODIUM 40 MG PO TBEC
40.0000 mg | DELAYED_RELEASE_TABLET | Freq: Every day | ORAL | Status: DC
  Administered 2014-11-01 – 2014-11-03 (×3): 40 mg via ORAL
  Filled 2014-11-01 (×2): qty 1

## 2014-11-01 MED ORDER — ONDANSETRON HCL 4 MG/2ML IJ SOLN
4.0000 mg | Freq: Four times a day (QID) | INTRAMUSCULAR | Status: DC | PRN
Start: 2014-11-01 — End: 2014-11-03

## 2014-11-01 MED ORDER — ACETAMINOPHEN 650 MG RE SUPP: 650.0000 mg | Freq: Four times a day (QID) | RECTAL | Status: DC | PRN

## 2014-11-01 MED ORDER — MIDAZOLAM HCL 2 MG/2ML IJ SOLN
INTRAMUSCULAR | Status: AC
Start: 2014-11-01 — End: 2014-11-02
  Filled 2014-11-01: qty 4

## 2014-11-01 MED ORDER — PIPERACILLIN-TAZOBACTAM 3.375 G IVPB
3.3750 g | Freq: Three times a day (TID) | INTRAVENOUS | Status: DC
  Administered 2014-11-01 – 2014-11-03 (×7): 3.375 g via INTRAVENOUS
  Filled 2014-11-01 (×10): qty 50

## 2014-11-01 MED ORDER — INSULIN ASPART 100 UNIT/ML ~~LOC~~ SOLN: 0.0000 [IU] | SUBCUTANEOUS | Status: DC

## 2014-11-01 MED ORDER — ASPIRIN EC 325 MG PO TBEC
325.0000 mg | DELAYED_RELEASE_TABLET | Freq: Every day | ORAL | Status: DC
  Administered 2014-11-01: 325 mg via ORAL
  Filled 2014-11-01 (×2): qty 1

## 2014-11-01 MED ORDER — OXYCODONE-ACETAMINOPHEN 5-325 MG PO TABS
1.0000 | ORAL_TABLET | Freq: Four times a day (QID) | ORAL | Status: DC | PRN
  Administered 2014-11-02 – 2014-11-03 (×3): 2 via ORAL
  Filled 2014-11-01 (×3): qty 2

## 2014-11-01 MED ORDER — ATORVASTATIN CALCIUM 40 MG PO TABS
40.0000 mg | ORAL_TABLET | Freq: Every day | ORAL | Status: DC
  Administered 2014-11-01 – 2014-11-02 (×2): 40 mg via ORAL
  Filled 2014-11-01 (×3): qty 1

## 2014-11-01 MED ORDER — MORPHINE SULFATE 2 MG/ML IJ SOLN
1.0000 mg | INTRAMUSCULAR | Status: DC | PRN
  Administered 2014-11-02 (×2): 1 mg via INTRAVENOUS
  Filled 2014-11-01 (×2): qty 1

## 2014-11-01 MED ORDER — FENTANYL CITRATE 0.05 MG/ML IJ SOLN
INTRAMUSCULAR | Status: AC
  Filled 2014-11-01: qty 4

## 2014-11-01 MED ORDER — ONDANSETRON HCL 4 MG PO TABS: 4.0000 mg | ORAL_TABLET | Freq: Four times a day (QID) | ORAL | Status: DC | PRN

## 2014-11-01 MED ORDER — ACETAMINOPHEN 325 MG PO TABS: 650.0000 mg | ORAL_TABLET | Freq: Four times a day (QID) | ORAL | Status: DC | PRN

## 2014-11-01 MED ORDER — MIDAZOLAM HCL 2 MG/2ML IJ SOLN
INTRAMUSCULAR | Status: AC | PRN
  Administered 2014-11-01: 1 mg via INTRAVENOUS
  Administered 2014-11-01: 0.5 mg via INTRAVENOUS

## 2014-11-01 MED ORDER — TECHNETIUM TC 99M MEBROFENIN IV KIT
5.0000 | PACK | Freq: Once | INTRAVENOUS | Status: AC | PRN
  Administered 2014-11-01: 5 via INTRAVENOUS

## 2014-11-01 MED ORDER — LIDOCAINE-EPINEPHRINE 1 %-1:100000 IJ SOLN
INTRAMUSCULAR | Status: AC
  Filled 2014-11-01: qty 1

## 2014-11-01 MED ORDER — FENTANYL CITRATE 0.05 MG/ML IJ SOLN
INTRAMUSCULAR | Status: AC | PRN
  Administered 2014-11-01: 25 ug via INTRAVENOUS
  Administered 2014-11-01: 50 ug via INTRAVENOUS

## 2014-11-01 MED ORDER — LISINOPRIL 10 MG PO TABS
10.0000 mg | ORAL_TABLET | Freq: Every day | ORAL | Status: DC
  Administered 2014-11-01 – 2014-11-03 (×3): 10 mg via ORAL
  Filled 2014-11-01 (×3): qty 1

## 2014-11-01 MED ORDER — HYDRALAZINE HCL 20 MG/ML IJ SOLN: 10.0000 mg | INTRAMUSCULAR | Status: DC | PRN

## 2014-11-01 MED ORDER — BACLOFEN 5 MG HALF TABLET
5.0000 mg | ORAL_TABLET | Freq: Two times a day (BID) | ORAL | Status: DC
  Administered 2014-11-01 – 2014-11-03 (×4): 5 mg via ORAL
  Filled 2014-11-01 (×6): qty 1

## 2014-11-01 MED ORDER — SODIUM CHLORIDE 0.9 % IV SOLN: INTRAVENOUS | Status: DC

## 2014-11-01 MED ORDER — INSULIN ASPART 100 UNIT/ML ~~LOC~~ SOLN
0.0000 [IU] | Freq: Three times a day (TID) | SUBCUTANEOUS | Status: DC
  Administered 2014-11-02 – 2014-11-03 (×2): 1 [IU] via SUBCUTANEOUS

## 2014-11-01 MED ORDER — METOPROLOL TARTRATE 1 MG/ML IV SOLN
2.5000 mg | Freq: Four times a day (QID) | INTRAVENOUS | Status: DC
  Administered 2014-11-01 (×2): 2.5 mg via INTRAVENOUS
  Administered 2014-11-01: 06:00:00 via INTRAVENOUS
  Administered 2014-11-02 (×3): 2.5 mg via INTRAVENOUS
  Filled 2014-11-01 (×10): qty 5

## 2014-11-01 NOTE — Sedation Documentation (Signed)
Dr. Pascal Lux in to s/w pt regarding procedure risks and benefits and answer pt questoins.

## 2014-11-01 NOTE — Sedation Documentation (Signed)
Scan pics taken by [facility], [place]. Now awaiting return of Dr. Pascal Lux to begin. Pt lying comfortably w/ eyes closed on procedure table in supine position.

## 2014-11-01 NOTE — Progress Notes (Signed)
Referring Physician(s): Dr. Greer Pickerel  Subjective: 74 yo male recently underwent lap chole. Did well, but has been admitted with nausea. CT scan finds complex collection in gallbladder fossa. Pt is afebrile and normal WBC, but concern this may be infectious vs hematoma IR asked to perform CT guided aspiration v drainage. Chart, PMHx, meds, labs, imaging reviewed.   Allergies: Bee venom and Influenza vaccines  Medications: Prior to Admission medications   Medication Sig Start Date End Date Taking? Authorizing Provider  aspirin EC 325 MG tablet Take 325 mg by mouth daily.   Yes Historical Provider, MD  atorvastatin (LIPITOR) 40 MG tablet TAKE 1 TABLET (40 MG TOTAL) BY MOUTH AT BEDTIME.   Yes Colon Branch, MD  baclofen (LIORESAL) 10 MG tablet TAKE 1/2 TABLET BY MOUTH TWICE A DAY 10/21/14  Yes Pieter Partridge, DO  diclofenac sodium (VOLTAREN) 1 % GEL Apply 1 application topically 2 (two) times daily as needed (pain). For pain 03/09/12  Yes Charlett Blake, MD  ferrous sulfate 325 (65 FE) MG tablet Take 325 mg by mouth daily with breakfast.   Yes Historical Provider, MD  insulin aspart protamine- aspart (NOVOLOG MIX 70/30) (70-30) 100 UNIT/ML injection Inject 0.05 mLs (5 Units total) into the skin 2 (two) times daily with a meal. Patient taking differently: Inject 15-45 Units into the skin 2 (two) times daily with a meal. 45 units every AM, 15 units every PM 08/26/14  Yes Orson Eva, MD  lisinopril (PRINIVIL,ZESTRIL) 10 MG tablet TAKE 1 TABLET BY MOUTH EVERY DAY 10/20/14  Yes Colon Branch, MD  metoprolol succinate (TOPROL-XL) 25 MG 24 hr tablet Take 1 tablet (25 mg total) by mouth daily. 08/26/14  Yes Orson Eva, MD  Multiple Vitamins-Minerals (MULTIVITAMIN WITH MINERALS) tablet Take 1 tablet by mouth daily.   Yes Historical Provider, MD  oxyCODONE-acetaminophen (PERCOCET/ROXICET) 5-325 MG per tablet Take 1-2 tablets by mouth every 6 (six) hours as needed for moderate pain (for abdominal pain.). 10/25/14   Yes Coralie Keens, MD  pantoprazole (PROTONIX) 40 MG tablet TAKE 1 TABLET BY MOUTH EVERY DAY AT 12 NOON 07/25/14  Yes Colon Branch, MD  glucagon (GLUCAGEN) 1 MG SOLR injection Inject 1 mg into the vein once as needed for low blood sugar.    Historical Provider, MD    Review of Systems  Vital Signs: BP 123/81 mmHg  Pulse 73  Temp(Src) 99 F (37.2 C) (Oral)  Resp 17  Wt 138 lb (62.596 kg)  SpO2 97%  Physical Exam General: 87 AA male, NAD, nontoxic appearing ENT: unremarkable airway Lungs: CTA without w/r/r Heart: Regular Abdomen:soft, ND, NT  Imaging: Ct Abdomen Pelvis W Contrast  10/31/2014   CLINICAL DATA:  Mid lower abdominal pain radiating to the epigastric region. Cholecystectomy six days prior.  EXAM: CT ABDOMEN AND PELVIS WITH CONTRAST  TECHNIQUE: Multidetector CT imaging of the abdomen and pelvis was performed using the standard protocol following bolus administration of intravenous contrast.  CONTRAST:  160mL OMNIPAQUE IOHEXOL 300 MG/ML  SOLN  COMPARISON:  CT 08/18/2014  FINDINGS: Small right pleural effusion with adjacent compressive atelectasis. Minimal atelectasis in the dependent left lower lobe. Coronary artery calcifications are seen.  Clips in the gallbladder fossa from cholecystectomy. There is an irregular fluid collection within the gallbladder fossa measuring 6.4 x 4.4 x 5.7 cm. This contains heterogeneous material and multiple foci of air, concerning for abscess. The common bile duct measures 6 mm in its midportion, flaring 10 9 mm distally.  No calcified choledocholithiasis, however there is suggestion of a 7 mm filling defect in the distal most common bile duct, axial image 41/100.  Small amounts of perihepatic fluid adjacent in the right lobe measures 11 mm. There is a 14 mm hepatic cyst in the left lobe. The spleen is normal. Minimal thickening of both adrenal glands without discrete nodule. No pancreatic ductal dilatation or surrounding inflammatory change. Kidneys  demonstrate symmetric enhancement and excretion. There are multiple bilateral renal cysts, unchanged from prior.  The stomach is physiologically distended. There are no dilated or thickened small bowel loops. The cecum is high-riding, no bowel obstruction with oral contrast in the mid transverse colon. There is no frank colonic wall thickening, soft tissue stranding about the distal rectum appear similar to prior exam.  Atherosclerosis the abdominal aorta and iliac vessels. There is circumferential thrombus that appears similar to prior exam. Right common iliac artery measures 1.8 cm, unchanged.  Within the pelvis the urinary bladder is physiologically distended. Prostate gland appears enlarged causing mass effect on the bladder base. There is no pelvic adenopathy. Left femoral vascular graft, no definite intragraft flow, similar to prior. Small fat containing umbilical hernia unchanged. Minimal soft tissue air in the anterior abdominal wall consistent went recent surgery, no subcutaneous fluid collection.  Stable appearance of the osseous structures with diffuse osteopenia. 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  IMPRESSION: 1. Complex fluid collection in the gallbladder fossa containing heterogeneous material and internal foci of air, concerning for abscess. This measures 6.4 x 4.4 x 5.7 cm per 2. Dilated distal common bowel duct of 9 mm, question of choledocholithiasis distally. 3. Multiple additional chronic findings appear stable.   Electronically Signed   By: Jeb Levering M.D.   On: 10/31/2014 19:33    Labs:  CBC:  Recent Labs  09/16/14 1058 10/24/14 1502 10/26/14 0943 10/31/14 1550  WBC 7.0 7.8 13.7* 8.9  HGB 11.4* 12.0* 12.0* 10.9*  HCT 35.3* 36.5* 36.3* 33.6*  PLT 350.0 193 173 309    COAGS:  Recent Labs  08/19/14 0820  INR 1.45  APTT 39*    BMP:  Recent Labs  08/26/14 0630 09/16/14 1058 10/24/14 1502 10/26/14 0943 10/31/14 1550  NA 138 141 139 136 138    K 3.7 3.7 3.6 4.3 4.2  CL 103 107 106 107 104  CO2 25 28 26 21 21   GLUCOSE 101* 113* 52* 240* 95  BUN 9 15 14 22 12   CALCIUM 8.0* 9.2 9.1 8.4 8.6  CREATININE 0.65 0.59 0.71 0.95 0.61  GFRNONAA >90  --  >90 81* >90  GFRAA >90  --  >90 >90 >90    LIVER FUNCTION TESTS:  Recent Labs  08/26/14 0630 09/16/14 1058 10/26/14 0943 10/31/14 1550  BILITOT 0.7 0.6 1.3* 1.2  AST 30 16 67* 24  ALT 36 9 55* 22  ALKPHOS 95 73 50 71  PROT 6.0 8.2 6.6 7.0  ALBUMIN 2.2* 3.4* 2.7* 2.7*    Assessment and Plan: Post lap chole surgical site fluid collection, abscess v hematoma Noted that Surgicel 'snow' was used, which can given complex appearance on CT Plan for CT guided aspiration, possible drain placement if frank pus aspirated. Labs reviewed. Consent signed in chart    I spent a total of 15inutes face to face in clinical consultation/evaluation, greater than 50% of which was counseling/coordinating care for CT guided aspiration.  SignedAscencion Dike 11/01/2014, 9:10 AM

## 2014-11-01 NOTE — Progress Notes (Addendum)
ANTIBIOTIC CONSULT NOTE - INITIAL  Pharmacy Consult for Zosyn Indication: Intra-abdominal Infection  Allergies  Allergen Reactions  . Bee Venom Anaphylaxis  . Influenza Vaccines Other (See Comments)    Sick for months    Patient Measurements: Weight 66.2 kg, Ht 42ft  (recorded on 10/25/14)  Vital Signs: Temp: 98.9 F (37.2 C) (04/11 2318) Temp Source: Oral (04/11 2318) BP: 131/83 mmHg (04/11 2318) Pulse Rate: 73 (04/11 2318) Intake/Output from previous day: 04/11 0701 - 04/12 0700 In: -  Out: 75 [Urine:75] Intake/Output from this shift:    Labs:  Recent Labs  10/31/14 1550  WBC 8.9  HGB 10.9*  PLT 309  CREATININE 0.61   Estimated Creatinine Clearance: 66.7 mL/min (by C-G formula based on Cr of 0.61). No results for input(s): VANCOTROUGH, VANCOPEAK, VANCORANDOM, GENTTROUGH, GENTPEAK, GENTRANDOM, TOBRATROUGH, TOBRAPEAK, TOBRARND, AMIKACINPEAK, AMIKACINTROU, AMIKACIN in the last 72 hours.   Microbiology: Recent Results (from the past 720 hour(s))  MRSA PCR Screening     Status: None   Collection Time: 10/25/14  4:00 PM  Result Value Ref Range Status   MRSA by PCR NEGATIVE NEGATIVE Final    Comment:        The GeneXpert MRSA Assay (FDA approved for NASAL specimens only), is one component of a comprehensive MRSA colonization surveillance program. It is not intended to diagnose MRSA infection nor to guide or monitor treatment for MRSA infections.     Medical History: Past Medical History  Diagnosis Date  . Hyperlipidemia   . Cardiomyopathy, ischemic     EF 45% per ECHO 2008  //   EF 25%, echo, August, 2013 //  Echo (8/15):  Mild LVH, EF 30-35%, ant-lat and lat AK, inf HK, Gr 1 DD, mild MR, mild LAE  . Pulmonary hypertension     moderate ECHO Jan 2008  . Systolic heart failure   . Multiple sclerosis [place] -- LAST VISIT 11-20-2010  NOTE W/ CHART  . Lung nodule     resolved 11-2006 CT Chest  . Tobacco abuse     quit   .  Increased prostate specific antigen (PSA) velocity   . Urinary retention     dx ~ 2-12, like from [place], now with a catheter, saw urology  . Carotid artery disease     a.  Doppler, February, 2012, 0-39% bilateral,Mild smooth plaque;  b.  Carotid US (8/15):  Bilateral 1-39% ICA >>> F/u 2 years  . Chronic indwelling Foley catheter   . Scoliosis associated with other condition   . Hemiparesis   . Fatigue SEVERE  . Impotence   . MI, acute, non ST segment elevation 2007    S/P PCI WITH X1 STENT (TAXUS DRUG-ELUTING) LEFT CIRCUMFLEX  . Hypertension   . H/O pleural effusion 2008    POST THORACENTESIS  . History of colon polyps PRECANCEROUS  . Insomnia   . CAD (coronary artery disease) CARDIOLOGIST- DR KATZ-- VISIT 06-05-2011 IN [facility]    non-STEMI, 2007.Marland Kitchenoccluded circumflex.. Taxus stent placed...residual 80% LAD...50% RCA  . Urinary tract infection     hx of  . Atherosclerotic PVD with ulceration     left foot  . PAC (premature atrial contraction)     December, 2013  . CHF (congestive heart failure)   . Diabetes mellitus     INSULIN-DEPENDent  . Dementia   . Constipation   . TIA (transient ischemic attack)   . Sleep apnea   . Neuromuscular disorder  MS    Assessment: 74 y.o AAM with multiple chronic medical problems including MS who was previously admitted in late Jan 2016 with acute cholecystitis. He underwent perc cholecystostomy tube placement by IR on 1/29. There was evidence of CBD and he underwent ERCP/sphincterotomy on 2/4 with retrieval of CBD stones.  Presents to ED 10/31/14 with abdominal pain.  Pharmacy consulted to dose Zosyn for intraabdominal infection. CrCl ~ 67 ml/min  Goal of Therapy:  Appropriate antibiotic dosing for renal funtion and treatment of infection  Plan:  Zosyn 3.375 gm IV q8hr (infuse over 4 hours) Monitor clinical status, renal function and culture results daily.  Nicole Cella, RPh Clinical Pharmacist Pager: [phone] 11/01/2014,12:29 AM

## 2014-11-01 NOTE — Sedation Documentation (Signed)
Called Dr. Pascal Lux and notifed that PT/INR was not done. Pt not on any blood thinners. No new orders, will proceed w/ procedure at this time.

## 2014-11-01 NOTE — Sedation Documentation (Signed)
Dr. Pascal Lux discussed outcome of procedure w/ pt who is very alert.

## 2014-11-01 NOTE — Progress Notes (Signed)
Patient ID: Jaime [name], male   DOB: [birth year], 74 y.o.   MRN: [id number]     [place]      [address] [place], [place] [address]    Phone: [phone] FAX: [phone]     Subjective: Symptoms have resolved.  Afebrile.  WBC normal.  LFTs normal yesterday evening.    Objective:  Vital signs:  Filed Vitals:   10/31/14 2225 10/31/14 2318 11/01/14 0500 11/01/14 0758  BP: 126/90 131/83 170/75 123/81  Pulse: 71 73 79 73  Temp:  98.9 F (37.2 C) 99.2 F (37.3 C) 99 F (37.2 C)  TempSrc:  Oral Oral Oral  Resp: $Remo'19 18 18 17  'hkicQ$ Weight:  62.596 kg (138 lb)    SpO2: 100% 97% 97% 97%    Last BM Date: 10/31/14  Intake/Output   Yesterday:  04/11 0701 - 04/12 0700 In: -  Out: 75 [Urine:75] This shift:  Total I/O In: 0  Out: 150 [Urine:150]  Physical Exam: General: Pt awake/alert/oriented x4 in no acute distress Abdomen: Soft.  Nondistended.  Non tender.  Incisions are intact, healing well.   No evidence of peritonitis.  No incarcerated hernias.    Problem List:   Principal Problem:   Abdominal pain Active Problems:   Cardiomyopathy, ischemic   Essential hypertension   Syncope   Diabetes mellitus type 2, controlled    Results:   Labs: Results for orders placed or performed during the hospital encounter of 10/31/14 (from the past 48 hour(s))  CBC with Differential/Platelet     Status: Abnormal   Collection Time: 10/31/14  3:50 PM  Result Value Ref Range   WBC 8.9 4.0 - 10.5 K/uL   RBC 4.30 4.22 - 5.81 MIL/uL   Hemoglobin 10.9 (L) 13.0 - 17.0 g/dL   HCT 33.6 (L) 39.0 - 52.0 %   MCV 78.1 78.0 - 100.0 fL   MCH 25.3 (L) 26.0 - 34.0 pg   MCHC 32.4 30.0 - 36.0 g/dL   RDW 16.9 (H) 11.5 - 15.5 %   Platelets 309 150 - 400 K/uL   Neutrophils Relative % 72 43 - 77 %   Neutro Abs 6.3 1.7 - 7.7 K/uL   Lymphocytes Relative 19 12 - 46 %   Lymphs Abs 1.7 0.7 - 4.0 K/uL   Monocytes Relative 8 3 - 12 %   Monocytes  Absolute 0.7 0.1 - 1.0 K/uL   Eosinophils Relative 1 0 - 5 %   Eosinophils Absolute 0.1 0.0 - 0.7 K/uL   Basophils Relative 0 0 - 1 %   Basophils Absolute 0.0 0.0 - 0.1 K/uL  Comprehensive metabolic panel     Status: Abnormal   Collection Time: 10/31/14  3:50 PM  Result Value Ref Range   Sodium 138 135 - 145 mmol/L   Potassium 4.2 3.5 - 5.1 mmol/L   Chloride 104 96 - 112 mmol/L   CO2 21 19 - 32 mmol/L   Glucose, Bld 95 70 - 99 mg/dL   BUN 12 6 - 23 mg/dL   Creatinine, Ser 0.61 0.50 - 1.35 mg/dL   Calcium 8.6 8.4 - 10.5 mg/dL   Total Protein 7.0 6.0 - 8.3 g/dL   Albumin 2.7 (L) 3.5 - 5.2 g/dL   AST 24 0 - 37 U/L   ALT 22 0 - 53 U/L   Alkaline Phosphatase 71 39 - 117 U/L   Total Bilirubin 1.2 0.3 - 1.2 mg/dL   GFR  calc non Af Amer >90 >90 mL/min   GFR calc Af Amer >90 >90 mL/min    Comment: (NOTE) The eGFR has been calculated using the CKD EPI equation. This calculation has not been validated in all clinical situations. eGFR's persistently <90 mL/min signify possible Chronic Kidney Disease.    Anion gap 13 5 - 15  Urinalysis, Routine w reflex microscopic     Status: Abnormal   Collection Time: 10/31/14  3:50 PM  Result Value Ref Range   Color, Urine YELLOW YELLOW   APPearance CLEAR CLEAR   Specific Gravity, Urine 1.022 1.005 - 1.030   pH 6.5 5.0 - 8.0   Glucose, UA NEGATIVE NEGATIVE mg/dL   Hgb urine dipstick NEGATIVE NEGATIVE   Bilirubin Urine NEGATIVE NEGATIVE   Ketones, ur 40 (A) NEGATIVE mg/dL   Protein, ur NEGATIVE NEGATIVE mg/dL   Urobilinogen, UA 1.0 0.0 - 1.0 mg/dL   Nitrite NEGATIVE NEGATIVE   Leukocytes, UA SMALL (A) NEGATIVE  Lipase, blood     Status: None   Collection Time: 10/31/14  3:50 PM  Result Value Ref Range   Lipase 21 11 - 59 U/L  Troponin I     Status: Abnormal   Collection Time: 10/31/14  3:50 PM  Result Value Ref Range   Troponin I 0.05 (H) <0.031 ng/mL    Comment:        PERSISTENTLY INCREASED TROPONIN VALUES IN THE RANGE OF  0.04-0.49 ng/mL CAN BE SEEN IN:       -UNSTABLE ANGINA       -CONGESTIVE HEART FAILURE       -MYOCARDITIS       -CHEST TRAUMA       -ARRYHTHMIAS       -LATE PRESENTING MYOCARDIAL INFARCTION       -COPD   CLINICAL FOLLOW-UP RECOMMENDED.   Urine microscopic-add on     Status: None   Collection Time: 10/31/14  3:50 PM  Result Value Ref Range   Squamous Epithelial / LPF RARE RARE   WBC, UA 3-6 <3 WBC/hpf   Bacteria, UA RARE RARE   Urine-Other MUCOUS PRESENT     Comment: FEW YEAST  Glucose, capillary     Status: Abnormal   Collection Time: 11/01/14 12:00 AM  Result Value Ref Range   Glucose-Capillary 160 (H) 70 - 99 mg/dL  Troponin I (q 6hr x 3)     Status: Abnormal   Collection Time: 11/01/14  1:01 AM  Result Value Ref Range   Troponin I 0.04 (H) <0.031 ng/mL    Comment:        PERSISTENTLY INCREASED TROPONIN VALUES IN THE RANGE OF 0.04-0.49 ng/mL CAN BE SEEN IN:       -UNSTABLE ANGINA       -CONGESTIVE HEART FAILURE       -MYOCARDITIS       -CHEST TRAUMA       -ARRYHTHMIAS       -LATE PRESENTING MYOCARDIAL INFARCTION       -COPD   CLINICAL FOLLOW-UP RECOMMENDED.   Glucose, capillary     Status: None   Collection Time: 11/01/14  5:23 AM  Result Value Ref Range   Glucose-Capillary 74 70 - 99 mg/dL  Troponin I (q 6hr x 3)     Status: Abnormal   Collection Time: 11/01/14  6:55 AM  Result Value Ref Range   Troponin I 0.04 (H) <0.031 ng/mL    Comment:        PERSISTENTLY INCREASED TROPONIN  VALUES IN THE RANGE OF 0.04-0.49 ng/mL CAN BE SEEN IN:       -UNSTABLE ANGINA       -CONGESTIVE HEART FAILURE       -MYOCARDITIS       -CHEST TRAUMA       -ARRYHTHMIAS       -LATE PRESENTING MYOCARDIAL INFARCTION       -COPD   CLINICAL FOLLOW-UP RECOMMENDED.   Glucose, capillary     Status: None   Collection Time: 11/01/14  7:32 AM  Result Value Ref Range   Glucose-Capillary 96 70 - 99 mg/dL    Imaging / Studies: Ct Abdomen Pelvis W Contrast  10/31/2014   CLINICAL DATA:   Mid lower abdominal pain radiating to the epigastric region. Cholecystectomy six days prior.  EXAM: CT ABDOMEN AND PELVIS WITH CONTRAST  TECHNIQUE: Multidetector CT imaging of the abdomen and pelvis was performed using the standard protocol following bolus administration of intravenous contrast.  CONTRAST:  181mL OMNIPAQUE IOHEXOL 300 MG/ML  SOLN  COMPARISON:  CT 08/18/2014  FINDINGS: Small right pleural effusion with adjacent compressive atelectasis. Minimal atelectasis in the dependent left lower lobe. Coronary artery calcifications are seen.  Clips in the gallbladder fossa from cholecystectomy. There is an irregular fluid collection within the gallbladder fossa measuring 6.4 x 4.4 x 5.7 cm. This contains heterogeneous material and multiple foci of air, concerning for abscess. The common bile duct measures 6 mm in its midportion, flaring 10 9 mm distally. No calcified choledocholithiasis, however there is suggestion of a 7 mm filling defect in the distal most common bile duct, axial image 41/100.  Small amounts of perihepatic fluid adjacent in the right lobe measures 11 mm. There is a 14 mm hepatic cyst in the left lobe. The spleen is normal. Minimal thickening of both adrenal glands without discrete nodule. No pancreatic ductal dilatation or surrounding inflammatory change. Kidneys demonstrate symmetric enhancement and excretion. There are multiple bilateral renal cysts, unchanged from prior.  The stomach is physiologically distended. There are no dilated or thickened small bowel loops. The cecum is high-riding, no bowel obstruction with oral contrast in the mid transverse colon. There is no frank colonic wall thickening, soft tissue stranding about the distal rectum appear similar to prior exam.  Atherosclerosis the abdominal aorta and iliac vessels. There is circumferential thrombus that appears similar to prior exam. Right common iliac artery measures 1.8 cm, unchanged.  Within the pelvis the urinary bladder  is physiologically distended. Prostate gland appears enlarged causing mass effect on the bladder base. There is no pelvic adenopathy. Left femoral vascular graft, no definite intragraft flow, similar to prior. Small fat containing umbilical hernia unchanged. Minimal soft tissue air in the anterior abdominal wall consistent went recent surgery, no subcutaneous fluid collection.  Stable appearance of the osseous structures with diffuse osteopenia. 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  IMPRESSION: 1. Complex fluid collection in the gallbladder fossa containing heterogeneous material and internal foci of air, concerning for abscess. This measures 6.4 x 4.4 x 5.7 cm per 2. Dilated distal common bowel duct of 9 mm, question of choledocholithiasis distally. 3. Multiple additional chronic findings appear stable.   Electronically Signed   By: Jeb Levering M.D.   On: 10/31/2014 19:33    Medications / Allergies:  Scheduled Meds: . dextrose      . insulin aspart  0-9 Units Subcutaneous Q4H  . metoprolol  2.5 mg Intravenous 4 times per day  . piperacillin-tazobactam (ZOSYN)  IV  3.375 g Intravenous 3 times per day   Continuous Infusions: . sodium chloride     PRN Meds:.acetaminophen **OR** acetaminophen, hydrALAZINE, morphine injection, ondansetron **OR** ondansetron (ZOFRAN) IV  Antibiotics: Anti-infectives    Start     Dose/Rate Route Frequency Ordered Stop   11/01/14 0400  piperacillin-tazobactam (ZOSYN) IVPB 3.375 g     3.375 g 12.5 mL/hr over 240 Minutes Intravenous 3 times per day 11/01/14 0038     10/31/14 2000  piperacillin-tazobactam (ZOSYN) IVPB 3.375 g     3.375 g 100 mL/hr over 30 Minutes Intravenous  Once 10/31/14 1957 10/31/14 2049        Assessment/Plan S/p laparoscopic cholecystectomy---10/25/14 Dr. Evlyn Courier RUQ abdominal pain, nausea RUQ fluid collection  IR guided aspiration/drain today Obtain a HIDA scan to rule a leak or retained stone, although low  suspicion for this Continue with antibiotics NPO now, resume diet if HIDA is negative  Erby Pian, [facility] Surgery Pager [phone](7A-4:30P) For consults and floor pages call [phone](7A-4:30P)  11/01/2014 9:27 AM

## 2014-11-01 NOTE — Procedures (Signed)
Technically successful CT guided aspiration of approximately 50 cc of dark red, non-foul smelling blood products from the GB fossa. Given concern for sterile post-op fluid collection, a drain was NOT placed. Samples sent to laboratory.   No immediate post procedural complications.

## 2014-11-01 NOTE — Sedation Documentation (Signed)
Dr. Pascal Lux removed 50cc of old non foul smelling dark sanguinous blood.

## 2014-11-01 NOTE — Progress Notes (Signed)
I have seen and assessed patient and agree with Dr. Moise Boring assessment and plan. Will check orthostatics. Patient has been seen by general surgery and patient underwent HIDA scan. Patient also status post CT-guided aspiration from gallbladder fossa. Labs pending. Follow.

## 2014-11-01 NOTE — H&P (Signed)
[facility] History and Physical  STACI CARVER ZJQ:[id number] DOB: 08/30/1940 DOA: 10/31/2014  Referring physician: ER physician Dr. Doy Mince. PCP: [name], MD   Chief Complaint: Abdominal pain.  HPI: Jaime [name] is a 74 y.o. male with a history of CAD status post stenting, ischemic cardiomyopathy last EF measured was 35%, diabetes mellitus type 2, anemia, multiple sclerosis, bilateral AKA, who was admitted January 2016 for sepsis secondary to acute cholecystitis at the time patient had undergone percutaneous drain placement and also had CBD stones for which patient had undergone sphincterotomy and ERCP and eventually had cholecystectomy last week presents to the ER because of abdominal pain and nausea vomiting. Patient states last evening he started developing epigastric and right upper quadrant pain with one episode of nausea and vomiting. In the ER patient had CT abdomen and pelvis which is concerning for developing abscess in the gallbladder fossa. On-call surgeon Dr. Redmond Pulling has been consulted at this time concern is for possible hematoma versus abscess development. In addition patient states he had a brief episode of syncope a few as prior to his abdominal pain. Patient states he lost consciousness while on the bed denies any chest pain or shortness of breath or palpitations prior or after the episode. In the ER patient's troponin is mildly elevated. EKG shows LBBB which is chronic. Patient has been admitted for further management.   Review of Systems: As presented in the history of presenting illness, rest negative.  Past Medical History  Diagnosis Date  . Hyperlipidemia   . Cardiomyopathy, ischemic     EF 45% per ECHO 2008  //   EF 25%, echo, August, 2013 //  Echo (8/15):  Mild LVH, EF 30-35%, ant-lat and lat AK, inf HK, Gr 1 DD, mild MR, mild LAE  . Pulmonary hypertension     moderate ECHO Jan 2008  . Systolic heart failure   . Multiple sclerosis [place] -- LAST VISIT 11-20-2010  NOTE W/ CHART  . Lung nodule     resolved 11-2006 CT Chest  . Tobacco abuse     quit   . Increased prostate specific antigen (PSA) velocity   . Urinary retention     dx ~ 2-12, like from [place], now with a catheter, saw urology  . Carotid artery disease     a.  Doppler, February, 2012, 0-39% bilateral,Mild smooth plaque;  b.  Carotid US (8/15):  Bilateral 1-39% ICA >>> F/u 2 years  . Chronic indwelling Foley catheter   . Scoliosis associated with other condition   . Hemiparesis   . Fatigue SEVERE  . Impotence   . MI, acute, non ST segment elevation 2007    S/P PCI WITH X1 STENT (TAXUS DRUG-ELUTING) LEFT CIRCUMFLEX  . Hypertension   . H/O pleural effusion 2008    POST THORACENTESIS  . History of colon polyps PRECANCEROUS  . Insomnia   . CAD (coronary artery disease) CARDIOLOGIST- DR KATZ-- VISIT 06-05-2011 IN [facility]    non-STEMI, 2007.Marland Kitchenoccluded circumflex.. Taxus stent placed...residual 80% LAD...50% RCA  . Urinary tract infection     hx of  . Atherosclerotic PVD with ulceration     left foot  . PAC (premature atrial contraction)     December, 2013  . CHF (congestive heart failure)   . Diabetes mellitus     INSULIN-DEPENDent  . Dementia   . Constipation   . TIA (transient ischemic attack)   . Sleep apnea   .  Neuromuscular disorder     MS   Past Surgical History  Procedure Laterality Date  . Hernia repair  1990    (R)  . Thoracentesis  2008    PLEURAL EFFUSION  . Cystoscopy  07/30/2011    Procedure: CYSTOSCOPY;  Surgeon: Hanley Ben, MD;  Location: [facility];  Service: Urology;  Laterality: N/A;  . Transurethral resection of prostate  07/30/2011    Procedure: TRANSURETHRAL RESECTION OF THE PROSTATE (TURP);  Surgeon: Hanley Ben, MD;  Location: [facility];  Service: Urology;  Laterality: N/A;  . Femoral-tibial bypass graft  03/11/2012    Procedure: BYPASS GRAFT FEMORAL-TIBIAL ARTERY;  Surgeon: Conrad Augusta, MD;  Location: [place];  Service: Vascular;  Laterality: Left;  Left Femoral -Tibial trunk bypass, Endarterectomy of Tibial- Peroneal trunk with vein angioplasty.  . Intraoperative arteriogram  03/11/2012    Procedure: INTRA OPERATIVE ARTERIOGRAM;  Surgeon: Conrad Marion, MD;  Location: [place];  Service: Vascular;  Laterality: Left;  . Femoral-popliteal bypass graft  03/11/2012    Procedure: BYPASS GRAFT FEMORAL-POPLITEAL ARTERY;  Surgeon: Conrad Simla, MD;  Location: [place];  Service: Vascular;  Laterality: Left;  embolectomy left lower leg  . Amputation  03/17/2012    Procedure: AMPUTATION ABOVE KNEE;  Surgeon: Conrad Berlin, MD;  Location: [place];  Service: Vascular;  Laterality: Left;  . Coronary angioplasty with stent placement  05-01-2006    OCCLUDED CIRCUMFLEX -- TAXUS STENT PLACMENT  AND RESIDUAL 80% LAD,  50% RCA  . Amputation  06/03/2012    Procedure: AMPUTATION ABOVE KNEE;  Surgeon: Conrad Vinton, MD;  Location: [place];  Service: Vascular;  Laterality: Right;  . Lower extremity angiogram Bilateral 12/17/2011    Procedure: LOWER EXTREMITY ANGIOGRAM;  Surgeon: Serafina Mitchell, MD;  Location: [facility] CATH LAB;  Service: Cardiovascular;  Laterality: Bilateral;  bil lower extrem angio  . Abdominal angiogram  12/17/2011    Procedure: ABDOMINAL ANGIOGRAM;  Surgeon: Serafina Mitchell, MD;  Location: [facility] CATH LAB;  Service: Cardiovascular;;  . Abdominal aortagram N/A 08/19/2013    Procedure: ABDOMINAL Maxcine Ham;  Surgeon: Conrad Beyerville, MD;  Location: [facility] CATH LAB;  Service: Cardiovascular;  Laterality: N/A;  . Ercp N/A 08/25/2014    Procedure: ENDOSCOPIC RETROGRADE CHOLANGIOPANCREATOGRAPHY (ERCP);  Surgeon: Ladene Artist, MD;  Location: [facility] ENDOSCOPY;  Service: Endoscopy;  Laterality: N/A;  . 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 Above knee leg amputation Right     05/2012  . Cholecystectomy  10/25/2014    DR [name]  . Cholecystectomy N/A 10/25/2014    Procedure: LAPAROSCOPIC CHOLECYSTECTOMY ;  Surgeon:  Coralie Keens, MD;  Location: [place];  Service: General;  Laterality: N/A;   Social History:  reports that he quit smoking about 2 years ago. He uses smokeless tobacco. He reports that he does not drink alcohol or use illicit drugs. Where does patient live at home. Can patient participate in ADLs? Yes.  Allergies  Allergen Reactions  . Bee Venom Anaphylaxis  . Influenza Vaccines Other (See Comments)    Sick for months    Family History:  Family History  Problem Relation Age of Onset  . Hypertension    . Heart attack Mother 28  . Heart disease Mother   . Stroke Mother   . Hyperlipidemia Mother   . Hypertension Mother   . Colon cancer Neg Hx   . Prostate cancer Neg Hx   . COPD Father   .  Peripheral vascular disease Father   . Diabetes Brother   . Heart disease Brother   . Hypertension Brother   . Heart attack Brother   . Diabetes Daughter       Prior to Admission medications   Medication Sig Start Date End Date Taking? Authorizing Provider  aspirin EC 325 MG tablet Take 325 mg by mouth daily.   Yes Historical Provider, MD  atorvastatin (LIPITOR) 40 MG tablet TAKE 1 TABLET (40 MG TOTAL) BY MOUTH AT BEDTIME.   Yes Colon Branch, MD  baclofen (LIORESAL) 10 MG tablet TAKE 1/2 TABLET BY MOUTH TWICE A DAY 10/21/14  Yes Pieter Partridge, DO  diclofenac sodium (VOLTAREN) 1 % GEL Apply 1 application topically 2 (two) times daily as needed (pain). For pain 03/09/12  Yes Charlett Blake, MD  ferrous sulfate 325 (65 FE) MG tablet Take 325 mg by mouth daily with breakfast.   Yes Historical Provider, MD  insulin aspart protamine- aspart (NOVOLOG MIX 70/30) (70-30) 100 UNIT/ML injection Inject 0.05 mLs (5 Units total) into the skin 2 (two) times daily with a meal. Patient taking differently: Inject 15-45 Units into the skin 2 (two) times daily with a meal. 45 units every AM, 15 units every PM 08/26/14  Yes Orson Eva, MD  lisinopril (PRINIVIL,ZESTRIL) 10 MG tablet TAKE 1 TABLET BY MOUTH EVERY DAY  10/20/14  Yes Colon Branch, MD  metoprolol succinate (TOPROL-XL) 25 MG 24 hr tablet Take 1 tablet (25 mg total) by mouth daily. 08/26/14  Yes Orson Eva, MD  Multiple Vitamins-Minerals (MULTIVITAMIN WITH MINERALS) tablet Take 1 tablet by mouth daily.   Yes Historical Provider, MD  oxyCODONE-acetaminophen (PERCOCET/ROXICET) 5-325 MG per tablet Take 1-2 tablets by mouth every 6 (six) hours as needed for moderate pain (for abdominal pain.). 10/25/14  Yes Coralie Keens, MD  pantoprazole (PROTONIX) 40 MG tablet TAKE 1 TABLET BY MOUTH EVERY DAY AT 12 NOON 07/25/14  Yes Colon Branch, MD  glucagon (GLUCAGEN) 1 MG SOLR injection Inject 1 mg into the vein once as needed for low blood sugar.    Historical Provider, MD    Physical Exam: Filed Vitals:   10/31/14 2130 10/31/14 2200 10/31/14 2225 10/31/14 2318  BP: 119/66 125/63 126/90 131/83  Pulse: 59 70 71 73  Temp:    98.9 F (37.2 C)  TempSrc:    Oral  Resp: 21 18 19 18   SpO2: 98% 100% 100% 97%     General:  Well-developed and nourished.  Eyes: Anicteric no pallor.  ENT: No discharge from the ears eyes nose or mouth.  Neck: No mass felt.  Cardiovascular: S1 and S2 heard.  Respiratory: No rhonchi or crepitations.  Abdomen: Soft nontender bowel sounds present. No guarding or rigidity.  Skin: No rash.  Musculoskeletal: No edema. Bilateral AKA.  Psychiatric: Appears normal.  Neurologic: Alert awake oriented to time place and person. Moves all extremities.  Labs on Admission:  Basic Metabolic Panel:  Recent Labs Lab 10/26/14 0943 10/31/14 1550  NA 136 138  K 4.3 4.2  CL 107 104  CO2 21 21  GLUCOSE 240* 95  BUN 22 12  CREATININE 0.95 0.61  CALCIUM 8.4 8.6   Liver Function Tests:  Recent Labs Lab 10/26/14 0943 10/31/14 1550  AST 67* 24  ALT 55* 22  ALKPHOS 50 71  BILITOT 1.3* 1.2  PROT 6.6 7.0  ALBUMIN 2.7* 2.7*    Recent Labs Lab 10/31/14 1550  LIPASE 21   No results  for input(s): AMMONIA in the last 168  hours. CBC:  Recent Labs Lab 10/26/14 0943 10/31/14 1550  WBC 13.7* 8.9  NEUTROABS  --  6.3  HGB 12.0* 10.9*  HCT 36.3* 33.6*  MCV 78.1 78.1  PLT 173 309   Cardiac Enzymes:  Recent Labs Lab 10/31/14 1550  TROPONINI 0.05*    BNP (last 3 results) No results for input(s): BNP in the last 8760 hours.  ProBNP (last 3 results)  Recent Labs  02/21/14 1153  PROBNP 84.0    CBG:  Recent Labs Lab 10/25/14 0906 10/25/14 1152 11/01/14  GLUCAP 102* 149* 160*    Radiological Exams on Admission: Ct Abdomen Pelvis W Contrast  10/31/2014   CLINICAL DATA:  Mid lower abdominal pain radiating to the epigastric region. Cholecystectomy six days prior.  EXAM: CT ABDOMEN AND PELVIS WITH CONTRAST  TECHNIQUE: Multidetector CT imaging of the abdomen and pelvis was performed using the standard protocol following bolus administration of intravenous contrast.  CONTRAST:  140mL OMNIPAQUE IOHEXOL 300 MG/ML  SOLN  COMPARISON:  CT 08/18/2014  FINDINGS: Small right pleural effusion with adjacent compressive atelectasis. Minimal atelectasis in the dependent left lower lobe. Coronary artery calcifications are seen.  Clips in the gallbladder fossa from cholecystectomy. There is an irregular fluid collection within the gallbladder fossa measuring 6.4 x 4.4 x 5.7 cm. This contains heterogeneous material and multiple foci of air, concerning for abscess. The common bile duct measures 6 mm in its midportion, flaring 10 9 mm distally. No calcified choledocholithiasis, however there is suggestion of a 7 mm filling defect in the distal most common bile duct, axial image 41/100.  Small amounts of perihepatic fluid adjacent in the right lobe measures 11 mm. There is a 14 mm hepatic cyst in the left lobe. The spleen is normal. Minimal thickening of both adrenal glands without discrete nodule. No pancreatic ductal dilatation or surrounding inflammatory change. Kidneys demonstrate symmetric enhancement and excretion. There  are multiple bilateral renal cysts, unchanged from prior.  The stomach is physiologically distended. There are no dilated or thickened small bowel loops. The cecum is high-riding, no bowel obstruction with oral contrast in the mid transverse colon. There is no frank colonic wall thickening, soft tissue stranding about the distal rectum appear similar to prior exam.  Atherosclerosis the abdominal aorta and iliac vessels. There is circumferential thrombus that appears similar to prior exam. Right common iliac artery measures 1.8 cm, unchanged.  Within the pelvis the urinary bladder is physiologically distended. Prostate gland appears enlarged causing mass effect on the bladder base. There is no pelvic adenopathy. Left femoral vascular graft, no definite intragraft flow, similar to prior. Small fat containing umbilical hernia unchanged. Minimal soft tissue air in the anterior abdominal wall consistent went recent surgery, no subcutaneous fluid collection.  Stable appearance of the osseous structures with diffuse osteopenia. 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  IMPRESSION: 1. Complex fluid collection in the gallbladder fossa containing heterogeneous material and internal foci of air, concerning for abscess. This measures 6.4 x 4.4 x 5.7 cm per 2. Dilated distal common bowel duct of 9 mm, question of choledocholithiasis distally. 3. Multiple additional chronic findings appear stable.   Electronically Signed   By: Jeb Levering M.D.   On: 10/31/2014 19:33    EKG: Independently reviewed. Normal sinus rhythm with LBBB. LBBB is chronic.  Assessment/Plan Principal Problem:   Abdominal pain Active Problems:   Cardiomyopathy, ischemic   Essential hypertension   Syncope   Diabetes  mellitus type 2, controlled   1. Abdominal pain with nausea and vomiting concerning at this time for gallbladder fossa abscess versus hematoma with recent cholecystectomy - appreciate Dr. Redmond Pulling on-call surgeons consult  notes and recommendations. At this time Patient nothing by mouth and IV antibiotics and consulted interventional radiologist for possible aspiration of the fluid around the gallbladder fossa for further details. 2. Syncope - cause not clear. Closely observe in telemetry. 3. Elevated troponin with history of CAD status post stenting - denies any chest pain. Since there is concern that the gallbladder fluid collection may be hematoma I have not placed patient on any antiplatelet agents. Since patient is nothing by mouth and place patient on IV metoprolol scheduled dose. 4. Hypertension - patient is on when necessary IV hydralazine for now since patient is nothing by mouth. 5. Diabetes mellitus type 2 - since patient is nothing by mouth patient on sliding-scale coverage patient is usually on NPH. Closely follow CBGs. 6. Anemia - follow CBC. 7. Ischemic cardiomyopathy last admission was 35%, this January - closely for respiratory status.   DVT Prophylaxis SCDs.  Code Status: Full code.  Family Communication: None.  Disposition Plan: Admit to inpatient.    Diane Mochizuki N. [facility] Pager [phone].  If 7PM-7AM, please contact night-coverage [email or web address] Password [email or web address] 11/01/2014, 12:20 AM

## 2014-11-01 NOTE — Sedation Documentation (Signed)
Dr. Pascal Lux returned.

## 2014-11-02 ENCOUNTER — Inpatient Hospital Stay (HOSPITAL_COMMUNITY): Payer: Medicare Other

## 2014-11-02 DIAGNOSIS — I255 Ischemic cardiomyopathy: Secondary | ICD-10-CM

## 2014-11-02 LAB — CBC
HCT: 30.4 % — ABNORMAL LOW (ref 39.0–52.0)
Hemoglobin: 9.8 g/dL — ABNORMAL LOW (ref 13.0–17.0)
MCH: 25.1 pg — AB (ref 26.0–34.0)
MCHC: 32.2 g/dL (ref 30.0–36.0)
MCV: 77.7 fL — AB (ref 78.0–100.0)
PLATELETS: 396 10*3/uL (ref 150–400)
RBC: 3.91 MIL/uL — AB (ref 4.22–5.81)
RDW: 16.7 % — ABNORMAL HIGH (ref 11.5–15.5)
WBC: 7.2 10*3/uL (ref 4.0–10.5)

## 2014-11-02 LAB — GLUCOSE, CAPILLARY
GLUCOSE-CAPILLARY: 87 mg/dL (ref 70–99)
Glucose-Capillary: 133 mg/dL — ABNORMAL HIGH (ref 70–99)
Glucose-Capillary: 121 mg/dL — ABNORMAL HIGH (ref 70–99)
Glucose-Capillary: 110 mg/dL — ABNORMAL HIGH (ref 70–99)

## 2014-11-02 LAB — COMPREHENSIVE METABOLIC PANEL
ALBUMIN: 2.5 g/dL — AB (ref 3.5–5.2)
ALK PHOS: 63 U/L (ref 39–117)
ALT: 21 U/L (ref 0–53)
ANION GAP: 13 (ref 5–15)
AST: 23 U/L (ref 0–37)
BUN: 10 mg/dL (ref 6–23)
CHLORIDE: 102 mmol/L (ref 96–112)
CO2: 21 mmol/L (ref 19–32)
Calcium: 8.3 mg/dL — ABNORMAL LOW (ref 8.4–10.5)
Creatinine, Ser: 0.58 mg/dL (ref 0.50–1.35)
GFR calc Af Amer: >90 mL/min (ref >90)
GFR calc non Af Amer: >90 mL/min (ref >90)
Glucose, Bld: 93 mg/dL (ref 70–99)
POTASSIUM: 3.7 mmol/L (ref 3.5–5.1)
Sodium: 136 mmol/L (ref 135–145)
Total Bilirubin: 1.2 mg/dL (ref 0.3–1.2)
Total Protein: 6.9 g/dL (ref 6.0–8.3)

## 2014-11-02 LAB — CLOSTRIDIUM DIFFICILE BY PCR: Toxigenic C. Difficile by PCR: NEGATIVE

## 2014-11-02 LAB — MAGNESIUM: MAGNESIUM: 1.8 mg/dL (ref 1.5–2.5)

## 2014-11-02 MED ORDER — METOPROLOL SUCCINATE ER 25 MG PO TB24
25.0000 mg | ORAL_TABLET | Freq: Every day | ORAL | Status: DC
  Administered 2014-11-02 – 2014-11-03 (×2): 25 mg via ORAL
  Filled 2014-11-02 (×2): qty 1

## 2014-11-02 NOTE — Progress Notes (Signed)
Subjective: No complaints this morning Denies abdominal pain  Objective: Vital signs in last 24 hours: Temp:  [98.3 F (36.8 C)-99.2 F (37.3 C)] 98.3 F (36.8 C) (04/13 0500) Pulse Rate:  [70-95] 70 (04/13 0500) Resp:  [17-23] 18 (04/13 0500) BP: (136-170)/(65-90) 136/65 mmHg (04/13 0500) SpO2:  [98 %-100 %] 98 % (04/13 0500) Weight:  [63.1 kg (139 lb 1.8 oz)] 63.1 kg (139 lb 1.8 oz) (04/12 2100) Last BM Date: 10/30/14  Intake/Output from previous day: 04/12 0701 - 04/13 0700 In: 1265 [P.O.:840; I.V.:325; IV Piggyback:100] Out: 550 [Urine:500] Intake/Output this shift:    Abdomen soft, non-tender  Lab Results:   Recent Labs  10/31/14 1550 11/02/14 0535  WBC 8.9 7.2  HGB 10.9* 9.8*  HCT 33.6* 30.4*  PLT 309 396   BMET  Recent Labs  10/31/14 1550 11/02/14 0535  NA 138 136  K 4.2 3.7  CL 104 102  CO2 21 21  GLUCOSE 95 93  BUN 12 10  CREATININE 0.61 0.58  CALCIUM 8.6 8.3*   PT/INR  Recent Labs  11/01/14 1610  LABPROT 15.1  INR 1.17   ABG No results for input(s): PHART, HCO3 in the last 72 hours.  Invalid input(s): PCO2, PO2  Studies/Results: Nm Hepatobiliary Including Gb  11/01/2014   CLINICAL DATA:  74 year old male status post cholecystectomy seven days ago. Abdominal pain nausea and vomiting. Query bile leak. Initial encounter.  EXAM: NUCLEAR MEDICINE HEPATOBILIARY IMAGING  TECHNIQUE: Sequential images of the abdomen were obtained out to 60 minutes following intravenous administration of radiopharmaceutical.  RADIOPHARMACEUTICALS:  5.0 Millicurie OY-77A Choletec  COMPARISON:  CT Abdomen and Pelvis 10/31/2014.  FINDINGS: Prompt radiotracer uptake by the liver and fairly prompt clearance of the blood pool. Photopenia over the right lobe related to the gallbladder surgical bed collection demonstrated yesterday.  Small bowel activity by 25 minutes. Over the first hour of the exam there is prominent activity at the porta hepatis just medial to the  photopenic area. However, the study was carried out for a second our and this activity slowly resolves. Otherwise normal bowel activity, and bladder activity are noted.  IMPRESSION: No abnormal accumulation of radiotracer to indicate a bile leak.   Electronically Signed   By: Genevie Ann M.D.   On: 11/01/2014 13:26   Ct Abdomen Pelvis W Contrast  10/31/2014   CLINICAL DATA:  Mid lower abdominal pain radiating to the epigastric region. Cholecystectomy six days prior.  EXAM: CT ABDOMEN AND PELVIS WITH CONTRAST  TECHNIQUE: Multidetector CT imaging of the abdomen and pelvis was performed using the standard protocol following bolus administration of intravenous contrast.  CONTRAST:  165mL OMNIPAQUE IOHEXOL 300 MG/ML  SOLN  COMPARISON:  CT 08/18/2014  FINDINGS: Small right pleural effusion with adjacent compressive atelectasis. Minimal atelectasis in the dependent left lower lobe. Coronary artery calcifications are seen.  Clips in the gallbladder fossa from cholecystectomy. There is an irregular fluid collection within the gallbladder fossa measuring 6.4 x 4.4 x 5.7 cm. This contains heterogeneous material and multiple foci of air, concerning for abscess. The common bile duct measures 6 mm in its midportion, flaring 10 9 mm distally. No calcified choledocholithiasis, however there is suggestion of a 7 mm filling defect in the distal most common bile duct, axial image 41/100.  Small amounts of perihepatic fluid adjacent in the right lobe measures 11 mm. There is a 14 mm hepatic cyst in the left lobe. The spleen is normal. Minimal thickening of both adrenal glands without discrete  nodule. No pancreatic ductal dilatation or surrounding inflammatory change. Kidneys demonstrate symmetric enhancement and excretion. There are multiple bilateral renal cysts, unchanged from prior.  The stomach is physiologically distended. There are no dilated or thickened small bowel loops. The cecum is high-riding, no bowel obstruction with oral  contrast in the mid transverse colon. There is no frank colonic wall thickening, soft tissue stranding about the distal rectum appear similar to prior exam.  Atherosclerosis the abdominal aorta and iliac vessels. There is circumferential thrombus that appears similar to prior exam. Right common iliac artery measures 1.8 cm, unchanged.  Within the pelvis the urinary bladder is physiologically distended. Prostate gland appears enlarged causing mass effect on the bladder base. There is no pelvic adenopathy. Left femoral vascular graft, no definite intragraft flow, similar to prior. Small fat containing umbilical hernia unchanged. Minimal soft tissue air in the anterior abdominal wall consistent went recent surgery, no subcutaneous fluid collection.  Stable appearance of the osseous structures with diffuse osteopenia. 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  IMPRESSION: 1. Complex fluid collection in the gallbladder fossa containing heterogeneous material and internal foci of air, concerning for abscess. This measures 6.4 x 4.4 x 5.7 cm per 2. Dilated distal common bowel duct of 9 mm, question of choledocholithiasis distally. 3. Multiple additional chronic findings appear stable.   Electronically Signed   By: Jeb Levering M.D.   On: 10/31/2014 19:33   Ct Aspiration  11/01/2014   INDICATION: History of cholecystitis, post cholecystectomy, now with indeterminate fluid collection within the gallbladder fossa worrisome for abscess or hematoma. Please perform CT-guided fluid aspiration. Please place percutaneous drainage catheter if necessary.  EXAM: CT GUIDANCE NEEDLE PLACEMENT  COMPARISON:  CT abdomen pelvis - 10/31/2014; 08/18/2014; ultrasound fluoroscopic guided cholecystostomy tube placement - 08/19/2014; ERCP - 08/25/2014; nuclear medicine HIDA scan -11/01/2014  MEDICATIONS: The patient is currently admitted to the hospital and receiving intravenous antibiotics. The antibiotics were administered  within an appropriate time frame prior to the initiation of the procedure.  ANESTHESIA/SEDATION: Fentanyl 75 mcg IV; Versed 1.5 mg IV  Total Moderate Sedation time  11 minutes  CONTRAST:  None  COMPLICATIONS: None immediate  PROCEDURE: Informed written consent was obtained from the patient after a discussion of the risks, benefits and alternatives to treatment. The patient was placed supine on the CT gantry and a pre procedural CT was performed re-demonstrating the known abscess/fluid collection within the gallbladder fossa. Ultrasound scanning was performed of the gallbladder fossa however failed to adequately demonstrate the ill-defined fluid collection within the gallbladder fossa. As such, the decision was made to perform the fluid aspiration with CT guidance. The procedure was planned. A timeout was performed prior to the initiation of the procedure.  The right lateral abdomen was prepped and draped in the usual sterile fashion. The overlying soft tissues were anesthetized with 1% lidocaine with epinephrine. Appropriate trajectory was planned with the use of a 22 gauge spinal needle. An 18 gauge trocar needle was advanced into the fluid collection and approximately 25 cc of dark red non purulent smelling fluid was aspirated from the gallbladder fossa. A short Amplatz super stiff wire was coiled within the collection. The trocar needle was exchanged for a 10 cm Yueh sheath catheter which was utilized to aspirate an additional 25 cc of dark red, non purulent smelling fluid as the catheter was slowly withdrawn.  Postprocedural scanning and demonstrated interval reduction in the ill-defined fluid collection within the gallbladder fossa.  At this point, the  procedure was terminated. A dressing was placed. The patient tolerated the procedure well without immediate postprocedural complication.  FINDINGS: Grossly unchanged appearance of ill-defined fluid collection within the gallbladder fossa with dominant component  measuring approximately 5.5 x 3.4 cm (image 17, series 2).  The gallbladder fossa fluid collection was accessed with an 18 gauge needle yielding the aspiration of approximately 25 cc of dark red and non purulent smelling fluid. As this fluid collection appears to represent either a hematoma or sterile postoperative abscess, the decision was made to perform a CT-guided aspiration and not place a drain.  Ultimately, the trocar needle was exchanged for a Yueh sheath catheter which allowed the aspiration of an additional 25 cc of dark red non foul smelling fluid. All aspirated samples were capped and sent to the laboratory for analysis.  IMPRESSION: Successful CT guided aspiration of approximately 50 cc of dark red non foul smelling material from the gallbladder fossa. Given concern this collection may represent a sterile postoperative fluid collection/evolving hematoma, a percutaneous drainage catheter was not placed at this time. Samples were sent to the laboratory as requested by the ordering clinical team.   Electronically Signed   By: Sandi Mariscal M.D.   On: 11/01/2014 16:17    Anti-infectives: Anti-infectives    Start     Dose/Rate Route Frequency Ordered Stop   11/01/14 0400  piperacillin-tazobactam (ZOSYN) IVPB 3.375 g     3.375 g 12.5 mL/hr over 240 Minutes Intravenous 3 times per day 11/01/14 0038     10/31/14 2000  piperacillin-tazobactam (ZOSYN) IVPB 3.375 g     3.375 g 100 mL/hr over 30 Minutes Intravenous  Once 10/31/14 1957 10/31/14 2049      Assessment/Plan:  CT guided aspiration performed yesterday.  Fluid more consistent with postop seroma/hematoma than abscess.  Gram stain and culture negative so far.  HIDA scan showed no evidence of bile leak or CDB obstruction.  LFT"s and WBC remain normal.   Will advance po. He seems to be improving.  Hopefully can be discharge in next 24 to 48 hours  LOS: 2 days    [place]endale Youngblood A 11/02/2014

## 2014-11-02 NOTE — Progress Notes (Signed)
[facility] PROGRESS NOTE  JERRIT HOREN XBJ:[id number] DOB: October 04, 1940 DOA: 10/31/2014 PCP: [name], MD  Assessment/Plan: 1-Abdominal pain Post op seroma vs hematoma.  CT guided aspiration of approximately 50 cc of dark red, non-foul smelling blood products from the GB fossa On zosyn, follow culture and sx recommendation regarding antibiotics.  Follow hv.   Syncope - cause not clear. Closely observe in telemetry. Will order echo.   Elevated troponin with history of CAD status post stenting - denies any chest pain. Since there is concern that the gallbladder fluid collection may be hematoma, patient was not placed patient on any antiplatelet agents.  Check ECHO/  Will follow sx recommendation regarding when to resume aspirin.   Hypertension -resume oral metoprolol. And lisinopril.   Diabetes mellitus type 2 - SSI.   Anemia - follow CBC.  Ischemic cardiomyopathy last admission was 35%, this January - closely for respiratory status. Right stump pain; check x ray.   Code Status: Full Code.  Family Communication: care discussed with patient.  Disposition Plan: home in 24 to 48 hours.    Consultants:  Surgery  IR  Procedures:  CT guided aspiration of approximately 50 cc of dark red, non-foul smelling blood products from the GB fossa  Antibiotics: Zosyn 4-12  HPI/Subjective: Abdominal pain better, tolerate breakfast.  Relates pai right stump, started overnight.   Objective: Filed Vitals:   11/02/14 0820  BP: 153/81  Pulse: 70  Temp: 98.5 F (36.9 C)  Resp:     Intake/Output Summary (Last 24 hours) at 11/02/14 1356 Last data filed at 11/02/14 1300  Gross per 24 hour  Intake   1685 ml  Output    400 ml  Net   1285 ml   Filed Weights   10/31/14 2318 11/01/14 2100  Weight: 62.596 kg (138 lb) 63.1 kg (139 lb 1.8 oz)    Exam:   General:  Alert in no distress.   Cardiovascular: S 1, S 2 RRR  Respiratory: CTA  Abdomen: BS present, soft, mild  tenderness.   Musculoskeletal: Bilateral AKA.   Data Reviewed: Basic Metabolic Panel:  Recent Labs Lab 10/31/14 1550 11/02/14 0535  NA 138 136  K 4.2 3.7  CL 104 102  CO2 21 21  GLUCOSE 95 93  BUN 12 10  CREATININE 0.61 0.58  CALCIUM 8.6 8.3*  MG  --  1.8   Liver Function Tests:  Recent Labs Lab 10/31/14 1550 11/02/14 0535  AST 24 23  ALT 22 21  ALKPHOS 71 63  BILITOT 1.2 1.2  PROT 7.0 6.9  ALBUMIN 2.7* 2.5*    Recent Labs Lab 10/31/14 1550  LIPASE 21   No results for input(s): AMMONIA in the last 168 hours. CBC:  Recent Labs Lab 10/31/14 1550 11/02/14 0535  WBC 8.9 7.2  NEUTROABS 6.3  --   HGB 10.9* 9.8*  HCT 33.6* 30.4*  MCV 78.1 77.7*  PLT 309 396   Cardiac Enzymes:  Recent Labs Lab 10/31/14 1550 11/01/14 0101 11/01/14 0655 11/01/14 1540  TROPONINI 0.05* 0.04* 0.04* <0.03   BNP (last 3 results) No results for input(s): BNP in the last 8760 hours.  ProBNP (last 3 results)  Recent Labs  02/21/14 1153  PROBNP 84.0    CBG:  Recent Labs Lab 11/01/14 1251 11/01/14 1605 11/01/14 2107 11/02/14 0816 11/02/14 1159  GLUCAP 85 82 110* 87 133*    Recent Results (from the past 240 hour(s))  MRSA PCR Screening     Status: None  Collection Time: 10/25/14  4:00 PM  Result Value Ref Range Status   MRSA by PCR NEGATIVE NEGATIVE Final    Comment:        The GeneXpert MRSA Assay (FDA approved for NASAL specimens only), is one component of a comprehensive MRSA colonization surveillance program. It is not intended to diagnose MRSA infection nor to guide or monitor treatment for MRSA infections.   Culture, routine-abscess     Status: None (Preliminary result)   Collection Time: 11/01/14  2:52 PM  Result Value Ref Range Status   Specimen Description ABSCESS  Final   Special Requests   Final    POST CHOLE,NOW WITH INDETERMINATE FLUID COLLECTION IN THE GB FOSSA   Gram Stain   Final    FEW WBC PRESENT,BOTH PMN AND MONONUCLEAR NO  SQUAMOUS EPITHELIAL CELLS SEEN NO ORGANISMS SEEN Performed at [facility]    Culture NO GROWTH Performed at [facility]   Final   Report Status PENDING  Incomplete  Culture, blood (routine x 2)     Status: None (Preliminary result)   Collection Time: 11/01/14  4:10 PM  Result Value Ref Range Status   Specimen Description BLOOD LEFT ANTECUBITAL  Final   Special Requests BOTTLES DRAWN AEROBIC ONLY 6CC  Final   Culture   Final           BLOOD CULTURE RECEIVED NO GROWTH TO DATE CULTURE WILL BE HELD FOR 5 DAYS BEFORE ISSUING A FINAL NEGATIVE REPORT Performed at [facility]    Report Status PENDING  Incomplete  Culture, blood (routine x 2)     Status: None (Preliminary result)   Collection Time: 11/01/14  4:20 PM  Result Value Ref Range Status   Specimen Description BLOOD LEFT HAND  Final   Special Requests BOTTLES DRAWN AEROBIC ONLY 10CC  Final   Culture   Final           BLOOD CULTURE RECEIVED NO GROWTH TO DATE CULTURE WILL BE HELD FOR 5 DAYS BEFORE ISSUING A FINAL NEGATIVE REPORT Performed at [facility]    Report Status PENDING  Incomplete  Clostridium Difficile by PCR     Status: None   Collection Time: 11/02/14  3:37 AM  Result Value Ref Range Status   C difficile by pcr NEGATIVE NEGATIVE Final     Studies: Nm Hepatobiliary Including Gb  11/01/2014   CLINICAL DATA:  74 year old male status post cholecystectomy seven days ago. Abdominal pain nausea and vomiting. Query bile leak. Initial encounter.  EXAM: NUCLEAR MEDICINE HEPATOBILIARY IMAGING  TECHNIQUE: Sequential images of the abdomen were obtained out to 60 minutes following intravenous administration of radiopharmaceutical.  RADIOPHARMACEUTICALS:  5.0 Millicurie CZ-66A Choletec  COMPARISON:  CT Abdomen and Pelvis 10/31/2014.  FINDINGS: Prompt radiotracer uptake by the liver and fairly prompt clearance of the blood pool. Photopenia over the right lobe related to the gallbladder surgical bed  collection demonstrated yesterday.  Small bowel activity by 25 minutes. Over the first hour of the exam there is prominent activity at the porta hepatis just medial to the photopenic area. However, the study was carried out for a second our and this activity slowly resolves. Otherwise normal bowel activity, and bladder activity are noted.  IMPRESSION: No abnormal accumulation of radiotracer to indicate a bile leak.   Electronically Signed   By: Genevie Ann M.D.   On: 11/01/2014 13:26   Ct Abdomen Pelvis W Contrast  10/31/2014   CLINICAL DATA:  Mid lower  abdominal pain radiating to the epigastric region. Cholecystectomy six days prior.  EXAM: CT ABDOMEN AND PELVIS WITH CONTRAST  TECHNIQUE: Multidetector CT imaging of the abdomen and pelvis was performed using the standard protocol following bolus administration of intravenous contrast.  CONTRAST:  166mL OMNIPAQUE IOHEXOL 300 MG/ML  SOLN  COMPARISON:  CT 08/18/2014  FINDINGS: Small right pleural effusion with adjacent compressive atelectasis. Minimal atelectasis in the dependent left lower lobe. Coronary artery calcifications are seen.  Clips in the gallbladder fossa from cholecystectomy. There is an irregular fluid collection within the gallbladder fossa measuring 6.4 x 4.4 x 5.7 cm. This contains heterogeneous material and multiple foci of air, concerning for abscess. The common bile duct measures 6 mm in its midportion, flaring 10 9 mm distally. No calcified choledocholithiasis, however there is suggestion of a 7 mm filling defect in the distal most common bile duct, axial image 41/100.  Small amounts of perihepatic fluid adjacent in the right lobe measures 11 mm. There is a 14 mm hepatic cyst in the left lobe. The spleen is normal. Minimal thickening of both adrenal glands without discrete nodule. No pancreatic ductal dilatation or surrounding inflammatory change. Kidneys demonstrate symmetric enhancement and excretion. There are multiple bilateral renal cysts,  unchanged from prior.  The stomach is physiologically distended. There are no dilated or thickened small bowel loops. The cecum is high-riding, no bowel obstruction with oral contrast in the mid transverse colon. There is no frank colonic wall thickening, soft tissue stranding about the distal rectum appear similar to prior exam.  Atherosclerosis the abdominal aorta and iliac vessels. There is circumferential thrombus that appears similar to prior exam. Right common iliac artery measures 1.8 cm, unchanged.  Within the pelvis the urinary bladder is physiologically distended. Prostate gland appears enlarged causing mass effect on the bladder base. There is no pelvic adenopathy. Left femoral vascular graft, no definite intragraft flow, similar to prior. Small fat containing umbilical hernia unchanged. Minimal soft tissue air in the anterior abdominal wall consistent went recent surgery, no subcutaneous fluid collection.  Stable appearance of the osseous structures with diffuse osteopenia. 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  IMPRESSION: 1. Complex fluid collection in the gallbladder fossa containing heterogeneous material and internal foci of air, concerning for abscess. This measures 6.4 x 4.4 x 5.7 cm per 2. Dilated distal common bowel duct of 9 mm, question of choledocholithiasis distally. 3. Multiple additional chronic findings appear stable.   Electronically Signed   By: Jeb Levering M.D.   On: 10/31/2014 19:33   Ct Aspiration  11/01/2014   INDICATION: History of cholecystitis, post cholecystectomy, now with indeterminate fluid collection within the gallbladder fossa worrisome for abscess or hematoma. Please perform CT-guided fluid aspiration. Please place percutaneous drainage catheter if necessary.  EXAM: CT GUIDANCE NEEDLE PLACEMENT  COMPARISON:  CT abdomen pelvis - 10/31/2014; 08/18/2014; ultrasound fluoroscopic guided cholecystostomy tube placement - 08/19/2014; ERCP - 08/25/2014;  nuclear medicine HIDA scan -11/01/2014  MEDICATIONS: The patient is currently admitted to the hospital and receiving intravenous antibiotics. The antibiotics were administered within an appropriate time frame prior to the initiation of the procedure.  ANESTHESIA/SEDATION: Fentanyl 75 mcg IV; Versed 1.5 mg IV  Total Moderate Sedation time  11 minutes  CONTRAST:  None  COMPLICATIONS: None immediate  PROCEDURE: Informed written consent was obtained from the patient after a discussion of the risks, benefits and alternatives to treatment. The patient was placed supine on the CT gantry and a pre procedural CT was performed  re-demonstrating the known abscess/fluid collection within the gallbladder fossa. Ultrasound scanning was performed of the gallbladder fossa however failed to adequately demonstrate the ill-defined fluid collection within the gallbladder fossa. As such, the decision was made to perform the fluid aspiration with CT guidance. The procedure was planned. A timeout was performed prior to the initiation of the procedure.  The right lateral abdomen was prepped and draped in the usual sterile fashion. The overlying soft tissues were anesthetized with 1% lidocaine with epinephrine. Appropriate trajectory was planned with the use of a 22 gauge spinal needle. An 18 gauge trocar needle was advanced into the fluid collection and approximately 25 cc of dark red non purulent smelling fluid was aspirated from the gallbladder fossa. A short Amplatz super stiff wire was coiled within the collection. The trocar needle was exchanged for a 10 cm Yueh sheath catheter which was utilized to aspirate an additional 25 cc of dark red, non purulent smelling fluid as the catheter was slowly withdrawn.  Postprocedural scanning and demonstrated interval reduction in the ill-defined fluid collection within the gallbladder fossa.  At this point, the procedure was terminated. A dressing was placed. The patient tolerated the procedure  well without immediate postprocedural complication.  FINDINGS: Grossly unchanged appearance of ill-defined fluid collection within the gallbladder fossa with dominant component measuring approximately 5.5 x 3.4 cm (image 17, series 2).  The gallbladder fossa fluid collection was accessed with an 18 gauge needle yielding the aspiration of approximately 25 cc of dark red and non purulent smelling fluid. As this fluid collection appears to represent either a hematoma or sterile postoperative abscess, the decision was made to perform a CT-guided aspiration and not place a drain.  Ultimately, the trocar needle was exchanged for a Yueh sheath catheter which allowed the aspiration of an additional 25 cc of dark red non foul smelling fluid. All aspirated samples were capped and sent to the laboratory for analysis.  IMPRESSION: Successful CT guided aspiration of approximately 50 cc of dark red non foul smelling material from the gallbladder fossa. Given concern this collection may represent a sterile postoperative fluid collection/evolving hematoma, a percutaneous drainage catheter was not placed at this time. Samples were sent to the laboratory as requested by the ordering clinical team.   Electronically Signed   By: Sandi Mariscal M.D.   On: 11/01/2014 16:17    Scheduled Meds: . atorvastatin  40 mg Oral q1800  . baclofen  5 mg Oral BID  . insulin aspart  0-9 Units Subcutaneous TID WC  . lisinopril  10 mg Oral Daily  . metoprolol  2.5 mg Intravenous 4 times per day  . pantoprazole  40 mg Oral Daily  . piperacillin-tazobactam (ZOSYN)  IV  3.375 g Intravenous 3 times per day   Continuous Infusions:   Principal Problem:   Abdominal pain Active Problems:   Cardiomyopathy, ischemic   Essential hypertension   Syncope   Diabetes mellitus type 2, controlled   Abscess of abdominal cavity   Gallbladder abscess    Time spent: 35 minutes.     Niel Hummer A  [facility] Pager [phone]. If  7PM-7AM, please contact night-coverage at [email or web address], password [facility] 11/02/2014, 1:56 PM  LOS: 2 days

## 2014-11-03 ENCOUNTER — Ambulatory Visit: Payer: TRICARE For Life (TFL) | Admitting: Internal Medicine

## 2014-11-03 LAB — CBC
HCT: 30.2 % — ABNORMAL LOW (ref 39.0–52.0)
Hemoglobin: 9.6 g/dL — ABNORMAL LOW (ref 13.0–17.0)
MCH: 25.3 pg — ABNORMAL LOW (ref 26.0–34.0)
MCHC: 31.8 g/dL (ref 30.0–36.0)
MCV: 79.5 fL (ref 78.0–100.0)
PLATELETS: 418 10*3/uL — AB (ref 150–400)
RBC: 3.8 MIL/uL — ABNORMAL LOW (ref 4.22–5.81)
RDW: 16.8 % — AB (ref 11.5–15.5)
WBC: 6.9 10*3/uL (ref 4.0–10.5)

## 2014-11-03 LAB — BASIC METABOLIC PANEL
ANION GAP: 13 (ref 5–15)
BUN: 6 mg/dL (ref 6–23)
CALCIUM: 8.4 mg/dL (ref 8.4–10.5)
CO2: 21 mmol/L (ref 19–32)
CREATININE: 0.5 mg/dL (ref 0.50–1.35)
Chloride: 102 mmol/L (ref 96–112)
GFR calc non Af Amer: >90 mL/min (ref >90)
Glucose, Bld: 112 mg/dL — ABNORMAL HIGH (ref 70–99)
Potassium: 3.9 mmol/L (ref 3.5–5.1)
SODIUM: 136 mmol/L (ref 135–145)

## 2014-11-03 LAB — GLUCOSE, CAPILLARY
GLUCOSE-CAPILLARY: 150 mg/dL — AB (ref 70–99)
Glucose-Capillary: 108 mg/dL — ABNORMAL HIGH (ref 70–99)

## 2014-11-03 LAB — HEMOGLOBIN A1C
Hgb A1c MFr Bld: 6.6 % — ABNORMAL HIGH (ref 4.8–5.6)
MEAN PLASMA GLUCOSE: 143 mg/dL

## 2014-11-03 MED ORDER — OXYCODONE-ACETAMINOPHEN 5-325 MG PO TABS: 1.0000 | ORAL_TABLET | Freq: Four times a day (QID) | ORAL | Status: DC | PRN

## 2014-11-03 MED ORDER — ASPIRIN EC 325 MG PO TBEC
325.0000 mg | DELAYED_RELEASE_TABLET | Freq: Every day | ORAL | Status: DC
  Administered 2014-11-03: 325 mg via ORAL
  Filled 2014-11-03 (×2): qty 1

## 2014-11-03 MED ORDER — AMOXICILLIN-POT CLAVULANATE 875-125 MG PO TABS: 1.0000 | ORAL_TABLET | Freq: Two times a day (BID) | ORAL | Status: DC

## 2014-11-03 MED ORDER — INSULIN ASPART PROT & ASPART (70-30 MIX) 100 UNIT/ML ~~LOC~~ SUSP: 5.0000 [IU] | Freq: Two times a day (BID) | SUBCUTANEOUS | Status: DC

## 2014-11-03 NOTE — Discharge Summary (Signed)
Physician Discharge Summary  ARNOLDO HILDRETH UMP:[id number] DOB: 1941-02-26 DOA: 10/31/2014  PCP: [name], MD  Admit date: 10/31/2014 Discharge date: 11/03/2014  Time spent: 35 minutes  Recommendations for Outpatient Follow-up:  Needs repeat Hb.  Needs to follow up with cardiology, might need ECHO.  Needs to follow up final abdominal fluids culture.  Follow up with surgery post op.    Discharge Diagnoses:    Abdominal pain, seroma, hematoma post surgery.    Cardiomyopathy, ischemic   Essential hypertension   Syncope   Diabetes mellitus type 2, controlled   Abscess of abdominal cavity   Discharge Condition: stable.   Diet recommendation: heart Healthy  Filed Weights   10/31/14 2318 11/01/14 2100 11/02/14 2035  Weight: 62.596 kg (138 lb) 63.1 kg (139 lb 1.8 oz) 63.8 kg (140 lb 10.5 oz)    History of present illness:  Jaime [name] is a 74 y.o. male with a history of CAD status post stenting, ischemic cardiomyopathy last EF measured was 35%, diabetes mellitus type 2, anemia, multiple sclerosis, bilateral AKA, who was admitted January 2016 for sepsis secondary to acute cholecystitis at the time patient had undergone percutaneous drain placement and also had CBD stones for which patient had undergone sphincterotomy and ERCP and eventually had cholecystectomy last week presents to the ER because of abdominal pain and nausea vomiting. Patient states last evening he started developing epigastric and right upper quadrant pain with one episode of nausea and vomiting. In the ER patient had CT abdomen and pelvis which is concerning for developing abscess in the gallbladder fossa. On-call surgeon Dr. Redmond Pulling has been consulted at this time concern is for possible hematoma versus abscess development. In addition patient states he had a brief episode of syncope a few as prior to his abdominal pain. Patient states he lost consciousness while on the bed denies any chest pain or shortness of breath  or palpitations prior or after the episode. In the ER patient's troponin is mildly elevated. EKG shows LBBB which is chronic. Patient has been admitted for further management.   Hospital Course:  1-Abdominal pain:  History of cholecystectomy.  Post op seroma vs hematoma.  CT guided aspiration of approximately 50 cc of dark red, non-foul smelling blood products from the GB fossa On zosyn, patient to be discharge on Augmentin for total 10 days.  Follow hv.   Syncope - cause not clear. Closely observe in telemetry. No further episodes. Needs to follow up with primary cardiologist.   Elevated troponin with history of CAD status post stenting - denies any chest pain. Since there is concern that the gallbladder fluid collection may be hematoma.  Denies chest pain. Troponin normalized. Patients needs to follow up with PCP/  Ok to resume aspirin per surgery.   Hypertension -resume oral metoprolol. And lisinopril.   Diabetes mellitus type 2 - SSI.   Anemia - follow CBC.  Ischemic cardiomyopathy last admission was 35%, this January - closely for respiratory status. Right stump pain; check x ray.   Procedures:  none  Consultations:  surgery  Discharge Exam: Filed Vitals:   11/03/14 1039  BP: 134/75  Pulse: 93  Temp: 97.3 F (36.3 C)  Resp: 18    General: NAD Cardiovascular: S 1, S 2 RRR Respiratory: CTA Abdomen; soft, nt, incision from surgery healing  Discharge Instructions   Discharge Instructions    Diet - low sodium heart healthy    Complete by:  As directed      Increase  activity slowly    Complete by:  As directed           Current Discharge Medication List    START taking these medications   Details  amoxicillin-clavulanate (AUGMENTIN) 875-125 MG per tablet Take 1 tablet by mouth 2 (two) times daily. Qty: 14 tablet, Refills: 0      CONTINUE these medications which have CHANGED   Details  insulin aspart protamine- aspart (NOVOLOG MIX 70/30) (70-30) 100  UNIT/ML injection Inject 0.05 mLs (5 Units total) into the skin 2 (two) times daily with a meal. Qty: 10 mL, Refills: 0    oxyCODONE-acetaminophen (PERCOCET/ROXICET) 5-325 MG per tablet Take 1-2 tablets by mouth every 6 (six) hours as needed for moderate pain (for abdominal pain.). Qty: 30 tablet, Refills: 0      CONTINUE these medications which have NOT CHANGED   Details  aspirin EC 325 MG tablet Take 325 mg by mouth daily.    atorvastatin (LIPITOR) 40 MG tablet TAKE 1 TABLET (40 MG TOTAL) BY MOUTH AT BEDTIME. Qty: 90 tablet, Refills: 1    baclofen (LIORESAL) 10 MG tablet TAKE 1/2 TABLET BY MOUTH TWICE A DAY Qty: 30 tablet, Refills: 0    diclofenac sodium (VOLTAREN) 1 % GEL Apply 1 application topically 2 (two) times daily as needed (pain). For pain    ferrous sulfate 325 (65 FE) MG tablet Take 325 mg by mouth daily with breakfast.    lisinopril (PRINIVIL,ZESTRIL) 10 MG tablet TAKE 1 TABLET BY MOUTH EVERY DAY Qty: 30 tablet, Refills: 5    metoprolol succinate (TOPROL-XL) 25 MG 24 hr tablet Take 1 tablet (25 mg total) by mouth daily. Qty: 30 tablet, Refills: 1    Multiple Vitamins-Minerals (MULTIVITAMIN WITH MINERALS) tablet Take 1 tablet by mouth daily.    pantoprazole (PROTONIX) 40 MG tablet TAKE 1 TABLET BY MOUTH EVERY DAY AT 12 NOON Qty: 90 tablet, Refills: 2    glucagon (GLUCAGEN) 1 MG SOLR injection Inject 1 mg into the vein once as needed for low blood sugar.       Allergies  Allergen Reactions  . Bee Venom Anaphylaxis  . Influenza Vaccines Other (See Comments)    Sick for months   Follow-up Information    Follow up with [name], MD In 1 week.   Specialty:  Internal Medicine   Contact information:   [place] [address] [phone]        The results of significant diagnostics from this hospitalization (including imaging, microbiology, ancillary and laboratory) are listed below for reference.    Significant Diagnostic  Studies: Nm Hepatobiliary Including Gb  11/01/2014   CLINICAL DATA:  74 year old male status post cholecystectomy seven days ago. Abdominal pain nausea and vomiting. Query bile leak. Initial encounter.  EXAM: NUCLEAR MEDICINE HEPATOBILIARY IMAGING  TECHNIQUE: Sequential images of the abdomen were obtained out to 60 minutes following intravenous administration of radiopharmaceutical.  RADIOPHARMACEUTICALS:  5.0 [place]ie GL-87F Choletec  COMPARISON:  CT Abdomen and Pelvis 10/31/2014.  FINDINGS: Prompt radiotracer uptake by the liver and fairly prompt clearance of the blood pool. Photopenia over the right lobe related to the gallbladder surgical bed collection demonstrated yesterday.  Small bowel activity by 25 minutes. Over the first hour of the exam there is prominent activity at the porta hepatis just medial to the photopenic area. However, the study was carried out for a second our and this activity slowly resolves. Otherwise normal bowel activity, and bladder activity are noted.  IMPRESSION: No abnormal accumulation of radiotracer to indicate a bile leak.   Electronically Signed   By: Genevie Ann M.D.   On: 11/01/2014 13:26   Ct Abdomen Pelvis W Contrast  10/31/2014   CLINICAL DATA:  Mid lower abdominal pain radiating to the epigastric region. Cholecystectomy six days prior.  EXAM: CT ABDOMEN AND PELVIS WITH CONTRAST  TECHNIQUE: Multidetector CT imaging of the abdomen and pelvis was performed using the standard protocol following bolus administration of intravenous contrast.  CONTRAST:  126mL OMNIPAQUE IOHEXOL 300 MG/ML  SOLN  COMPARISON:  CT 08/18/2014  FINDINGS: Small right pleural effusion with adjacent compressive atelectasis. Minimal atelectasis in the dependent left lower lobe. Coronary artery calcifications are seen.  Clips in the gallbladder fossa from cholecystectomy. There is an irregular fluid collection within the gallbladder fossa measuring 6.4 x 4.4 x 5.7 cm. This contains heterogeneous material  and multiple foci of air, concerning for abscess. The common bile duct measures 6 mm in its midportion, flaring 10 9 mm distally. No calcified choledocholithiasis, however there is suggestion of a 7 mm filling defect in the distal most common bile duct, axial image 41/100.  Small amounts of perihepatic fluid adjacent in the right lobe measures 11 mm. There is a 14 mm hepatic cyst in the left lobe. The spleen is normal. Minimal thickening of both adrenal glands without discrete nodule. No pancreatic ductal dilatation or surrounding inflammatory change. Kidneys demonstrate symmetric enhancement and excretion. There are multiple bilateral renal cysts, unchanged from prior.  The stomach is physiologically distended. There are no dilated or thickened small bowel loops. The cecum is high-riding, no bowel obstruction with oral contrast in the mid transverse colon. There is no frank colonic wall thickening, soft tissue stranding about the distal rectum appear similar to prior exam.  Atherosclerosis the abdominal aorta and iliac vessels. There is circumferential thrombus that appears similar to prior exam. Right common iliac artery measures 1.8 cm, unchanged.  Within the pelvis the urinary bladder is physiologically distended. Prostate gland appears enlarged causing mass effect on the bladder base. There is no pelvic adenopathy. Left femoral vascular graft, no definite intragraft flow, similar to prior. Small fat containing umbilical hernia unchanged. Minimal soft tissue air in the anterior abdominal wall consistent went recent surgery, no subcutaneous fluid collection.  Stable appearance of the osseous structures with diffuse osteopenia. 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  IMPRESSION: 1. Complex fluid collection in the gallbladder fossa containing heterogeneous material and internal foci of air, concerning for abscess. This measures 6.4 x 4.4 x 5.7 cm per 2. Dilated distal common bowel duct of 9 mm, question  of choledocholithiasis distally. 3. Multiple additional chronic findings appear stable.   Electronically Signed   By: Jeb Levering M.D.   On: 10/31/2014 19:33   Ct Aspiration  11/01/2014   INDICATION: History of cholecystitis, post cholecystectomy, now with indeterminate fluid collection within the gallbladder fossa worrisome for abscess or hematoma. Please perform CT-guided fluid aspiration. Please place percutaneous drainage catheter if necessary.  EXAM: CT GUIDANCE NEEDLE PLACEMENT  COMPARISON:  CT abdomen pelvis - 10/31/2014; 08/18/2014; ultrasound fluoroscopic guided cholecystostomy tube placement - 08/19/2014; ERCP - 08/25/2014; nuclear medicine HIDA scan -11/01/2014  MEDICATIONS: The patient is currently admitted to the hospital and receiving intravenous antibiotics. The antibiotics were administered within an appropriate time frame prior to the initiation of the procedure.  ANESTHESIA/SEDATION: Fentanyl 75 mcg IV; Versed 1.5 mg IV  Total Moderate Sedation time  11 minutes  CONTRAST:  None  COMPLICATIONS: None immediate  PROCEDURE: Informed written consent was obtained from the patient after a discussion of the risks, benefits and alternatives to treatment. The patient was placed supine on the CT gantry and a pre procedural CT was performed re-demonstrating the known abscess/fluid collection within the gallbladder fossa. Ultrasound scanning was performed of the gallbladder fossa however failed to adequately demonstrate the ill-defined fluid collection within the gallbladder fossa. As such, the decision was made to perform the fluid aspiration with CT guidance. The procedure was planned. A timeout was performed prior to the initiation of the procedure.  The right lateral abdomen was prepped and draped in the usual sterile fashion. The overlying soft tissues were anesthetized with 1% lidocaine with epinephrine. Appropriate trajectory was planned with the use of a 22 gauge spinal needle. An 18 gauge  trocar needle was advanced into the fluid collection and approximately 25 cc of dark red non purulent smelling fluid was aspirated from the gallbladder fossa. A short Amplatz super stiff wire was coiled within the collection. The trocar needle was exchanged for a 10 cm Yueh sheath catheter which was utilized to aspirate an additional 25 cc of dark red, non purulent smelling fluid as the catheter was slowly withdrawn.  Postprocedural scanning and demonstrated interval reduction in the ill-defined fluid collection within the gallbladder fossa.  At this point, the procedure was terminated. A dressing was placed. The patient tolerated the procedure well without immediate postprocedural complication.  FINDINGS: Grossly unchanged appearance of ill-defined fluid collection within the gallbladder fossa with dominant component measuring approximately 5.5 x 3.4 cm (image 17, series 2).  The gallbladder fossa fluid collection was accessed with an 18 gauge needle yielding the aspiration of approximately 25 cc of dark red and non purulent smelling fluid. As this fluid collection appears to represent either a hematoma or sterile postoperative abscess, the decision was made to perform a CT-guided aspiration and not place a drain.  Ultimately, the trocar needle was exchanged for a Yueh sheath catheter which allowed the aspiration of an additional 25 cc of dark red non foul smelling fluid. All aspirated samples were capped and sent to the laboratory for analysis.  IMPRESSION: Successful CT guided aspiration of approximately 50 cc of dark red non foul smelling material from the gallbladder fossa. Given concern this collection may represent a sterile postoperative fluid collection/evolving hematoma, a percutaneous drainage catheter was not placed at this time. Samples were sent to the laboratory as requested by the ordering clinical team.   Electronically Signed   By: Sandi Mariscal M.D.   On: 11/01/2014 16:17   Dg Femur, Min 2 Views  Right  11/02/2014   CLINICAL DATA:  Pain this morning in right hip region  EXAM: RIGHT FEMUR 2 VIEWS  COMPARISON:  None  FINDINGS: The bones are osteopenic. 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 No acute fracture or subluxation identified. Diabetic type vascular calcifications are noted.  IMPRESSION: 1. Status post above the knee amputation. 2. No acute findings noted. If there is high clinical suspicion for occult fracture consider further evaluation with MRI. Although CT is expeditious, evidence is lacking regarding accuracy of CT over plain film radiography.   Electronically Signed   By: Kerby Moors M.D.   On: 11/02/2014 15:48    Microbiology: Recent Results (from the past 240 hour(s))  MRSA PCR Screening     Status: None   Collection Time: 10/25/14  4:00 PM  Result Value Ref Range Status   MRSA by PCR NEGATIVE NEGATIVE Final    Comment:        The GeneXpert MRSA Assay (FDA approved for NASAL specimens only), is one component of a comprehensive MRSA colonization surveillance program. It is not intended to diagnose MRSA infection nor to guide or monitor treatment for MRSA infections.   Culture, routine-abscess     Status: None (Preliminary result)   Collection Time: 11/01/14  2:52 PM  Result Value Ref Range Status   Specimen Description ABSCESS  Final   Special Requests   Final    POST CHOLE,NOW WITH INDETERMINATE FLUID COLLECTION IN THE GB FOSSA   Gram Stain   Final    FEW WBC PRESENT,BOTH PMN AND MONONUCLEAR NO SQUAMOUS EPITHELIAL CELLS SEEN NO ORGANISMS SEEN Performed at [facility]    Culture   Final    NO GROWTH 1 DAY Performed at [facility]    Report Status PENDING  Incomplete  Culture, blood (routine x 2)     Status: None (Preliminary result)   Collection Time: 11/01/14  4:10 PM  Result Value Ref Range Status   Specimen Description BLOOD LEFT ANTECUBITAL  Final    Special Requests BOTTLES DRAWN AEROBIC ONLY 6CC  Final   Culture   Final           BLOOD CULTURE RECEIVED NO GROWTH TO DATE CULTURE WILL BE HELD FOR 5 DAYS BEFORE ISSUING A FINAL NEGATIVE REPORT Performed at [facility]    Report Status PENDING  Incomplete  Culture, blood (routine x 2)     Status: None (Preliminary result)   Collection Time: 11/01/14  4:20 PM  Result Value Ref Range Status   Specimen Description BLOOD LEFT HAND  Final   Special Requests BOTTLES DRAWN AEROBIC ONLY 10CC  Final   Culture   Final           BLOOD CULTURE RECEIVED NO GROWTH TO DATE CULTURE WILL BE HELD FOR 5 DAYS BEFORE ISSUING A FINAL NEGATIVE REPORT Performed at [facility]    Report Status PENDING  Incomplete  Clostridium Difficile by PCR     Status: None   Collection Time: 11/02/14  3:37 AM  Result Value Ref Range Status   C difficile by pcr NEGATIVE NEGATIVE Final     Labs: Basic Metabolic Panel:  Recent Labs Lab 10/31/14 1550 11/02/14 0535 11/03/14 0614  NA 138 136 136  K 4.2 3.7 3.9  CL 104 102 102  CO2 21 21 21   GLUCOSE 95 93 112*  BUN 12 10 6   CREATININE 0.61 0.58 0.50  CALCIUM 8.6 8.3* 8.4  MG  --  1.8  --    Liver Function Tests:  Recent Labs Lab 10/31/14 1550 11/02/14 0535  AST 24 23  ALT 22 21  ALKPHOS 71 63  BILITOT 1.2 1.2  PROT 7.0 6.9  ALBUMIN 2.7* 2.5*    Recent Labs Lab 10/31/14 1550  LIPASE 21   No results for input(s): AMMONIA in the last 168 hours. CBC:  Recent Labs Lab 10/31/14 1550 11/02/14 0535 11/03/14 0614  WBC 8.9 7.2 6.9  NEUTROABS 6.3  --   --   HGB 10.9* 9.8* 9.6*  HCT 33.6* 30.4* 30.2*  MCV 78.1 77.7* 79.5  PLT 309 396 418*   Cardiac Enzymes:  Recent Labs Lab 10/31/14 1550 11/01/14 0101 11/01/14 0655 11/01/14 1540  TROPONINI 0.05* 0.04* 0.04* <0.03   BNP: BNP (  last 3 results) No results for input(s): BNP in the last 8760 hours.  ProBNP (last 3 results)  Recent Labs  02/21/14 1153  PROBNP 84.0     CBG:  Recent Labs Lab 11/02/14 0816 11/02/14 1159 11/02/14 1620 11/02/14 2033 11/03/14 0745  GLUCAP 87 133* 121* 110* 108*       Signed:  Viliami Bracco A  [facility] 11/03/2014, 11:18 AM

## 2014-11-03 NOTE — Progress Notes (Signed)
PT Cancellation Note  Patient Details Name: Jaime [name] MRN: [id number] DOB: 1941-05-11   Cancelled Treatment:    Reason Eval/Treat Not Completed: PT screened, no needs identified, will sign off.  Per OT, pt w/c bound and family uses lift to get him in the chair. Pt is independent with chair mobility. OT states that she and pt agree that pt has no further therapy needs. PT will sign off at this time, however if needs change please reconsult.    Rolinda Roan 11/03/2014, 12:29 PM   Rolinda Roan, PT, DPT Acute Rehabilitation Services Pager: [phone]

## 2014-11-03 NOTE — Evaluation (Signed)
Occupational Therapy Evaluation and Discharge Summary Patient Details Name: Jaime [name] MRN: [id number] DOB: 1941-02-09 Today's Date: 11/03/2014    History of Present Illness Pt admitted back to [facility] after having cholesystectomy with sepsis in Jan.  Pt now with abdominal pain.   CT guided aspiration completed 4/12 with relief.  Appears to be a hematoma on his gallbladder fossa.  pt with h/o ischemic cardiomyopathy, CAD, stent, EF 35%, B AKA and multiple sclerosis.   Clinical Impression   Pt admitted for the above diagnosis but appears to be at baseline for all adls.  Pt is not in need of further OT services.  Wife and pt comfortable with d/c today and managing at home.     Follow Up Recommendations  No OT follow up    Equipment Recommendations  None recommended by OT    Recommendations for Other Services       Precautions / Restrictions Precautions Precautions: Fall Restrictions Weight Bearing Restrictions: No      Mobility Bed Mobility Overal bed mobility: Needs Assistance Bed Mobility: Rolling Rolling: Mod assist         General bed mobility comments: Pt needs assist to roll onto his side and stay on side for toileting and dressing assist.  Transfers Overall transfer level: Needs assistance Equipment used:  (pt dependent on hoyer lift to get OOB.)             General transfer comment: Dependent on hoyer for all transfers.    Balance Overall balance assessment: Needs assistance Sitting-balance support: Bilateral upper extremity supported Sitting balance-Leahy Scale: Poor Sitting balance - Comments: pt requires straps to sit in chair.                                    ADL Overall ADL's : At baseline                                       General ADL Comments: Pt and pt's family state pt is at his baseline with adls. Pt has required assist for adls for years.     Vision     Perception     Praxis       Pertinent Vitals/Pain Pain Assessment: No/denies pain     Hand Dominance Right   Extremity/Trunk Assessment Upper Extremity Assessment Upper Extremity Assessment: Overall WFL for tasks assessed   Lower Extremity Assessment Lower Extremity Assessment:  (BLE AKA)   Cervical / Trunk Assessment Cervical / Trunk Assessment: Other exceptions (pt with significant scoliosis)   Communication Communication Communication: No difficulties   Cognition Arousal/Alertness: Awake/alert Behavior During Therapy: WFL for tasks assessed/performed Overall Cognitive Status: Within Functional Limits for tasks assessed                     General Comments       Exercises       Shoulder Instructions      Home Living Family/patient expects to be discharged to:: Private residence Living Arrangements: Spouse/significant other Available Help at Discharge: Family;Available 24 hours/day Type of Home: House Home Access: Ramped entrance     Home Layout: One level     Bathroom Shower/Tub: Other (comment) (sponge bathes in bede.)   Bathroom Toilet:  (uses diapers.)     Home Equipment: Wheelchair - power;Hospital bed  Additional Comments: hoyer lift      Prior Functioning/Environment Level of Independence: Needs assistance  Gait / Transfers Assistance Needed: hoyer lift for OOB ADL's / Homemaking Assistance Needed: Pts wife assists with bathing, dressing and toileting at bed level. Pt feeds self and grooms self.         OT Diagnosis:     OT Problem List:     OT Treatment/Interventions:      OT Goals(Current goals can be found in the care plan section) Acute Rehab OT Goals Patient Stated Goal: to get out of here today.  OT Frequency:     Barriers to D/C:            Co-evaluation              End of Session Nurse Communication: Mobility status  Activity Tolerance: Patient tolerated treatment well Patient left: in bed;with call bell/phone within reach;with  family/visitor present   Time: 8088-1103 OT Time Calculation (min): 12 min Charges:  OT General Charges $OT Visit: 1 Procedure OT Evaluation $Initial OT Evaluation Tier I: 1 Procedure G-Codes:    Jaime [name] 11/30/14, 11:50 AM  [phone]

## 2014-11-03 NOTE — Care Management Note (Signed)
CARE MANAGEMENT NOTE 11/03/2014  Patient:  Jaime [name], Jaime [name]   Account Number:  [id number]  Date Initiated:  11/03/2014  Documentation initiated by:  Onnika Siebel  Subjective/Objective Assessment:   CM following for progression and d/c planning.     Action/Plan:   No needs identified at this time..   Anticipated DC Date:  11/05/2014   Anticipated DC Plan:  HOME/SELF CARE         Choice offered to / List presented to:             Status of service:  In process, will continue to follow Medicare Important Message given?   (If response is "NO", the following Medicare IM given date fields will be blank) Date Medicare IM given:   Medicare IM given by:   Date Additional Medicare IM given:   Additional Medicare IM given by:    Discharge Disposition:    Per UR Regulation:    If discussed at Long Length of Stay Meetings, dates discussed:    Comments:

## 2014-11-03 NOTE — Progress Notes (Signed)
Patient to be discharged to home. Discharge instructions and follow up appts reviewed with spouse. PIV x2 removed. Tele box #12 removed and returned to the nurse's station.   Joellen Jersey, RN.

## 2014-11-03 NOTE — Care Management Note (Addendum)
CARE MANAGEMENT NOTE 11/03/2014  Patient:  Jaime [name], Jaime [name]   Account Number:  [id number]  Date Initiated:  11/03/2014  Documentation initiated by:  Aundrea Horace  Subjective/Objective Assessment:   CM following for progression and d/c planning.     Action/Plan:   No needs identified at this time..   Anticipated DC Date:  11/05/2014   Anticipated DC Plan:  HOME/SELF CARE         Choice offered to / List presented to:             Status of service:  In process, will continue to follow Medicare Important Message given?  Yes (If response is "NO", the following Medicare IM given date fields will be blank) Date Medicare IM given:  11/03/2014 Medicare IM given by:  Jasmine Pang  Date Additional Medicare IM given:   Additional Medicare IM given by:    Discharge Disposition:    Per UR Regulation:    If discussed at Long Length of Stay Meetings, dates discussed:    Comments:

## 2014-11-03 NOTE — Progress Notes (Signed)
Subjective: He reports having intermittent abdominal pain Tolerating po  Objective: Vital signs in last 24 hours: Temp:  [97.9 F (36.6 C)-99 F (37.2 C)] 99 F (37.2 C) (04/14 0436) Pulse Rate:  [66-79] 79 (04/14 0436) Resp:  [17] 17 (04/14 0436) BP: (153-171)/(81-96) 167/95 mmHg (04/14 0436) SpO2:  [99 %-100 %] 99 % (04/14 0436) Weight:  [63.8 kg (140 lb 10.5 oz)] 63.8 kg (140 lb 10.5 oz) (04/13 2035) Last BM Date: 11/02/14  Intake/Output from previous day: 04/13 0701 - 04/14 0700 In: 660 [P.O.:660] Out: 950 [Urine:950] Intake/Output this shift:    Abdomen is totally soft, minimal tenderness  Lab Results:   Recent Labs  10/31/14 1550 11/02/14 0535  WBC 8.9 7.2  HGB 10.9* 9.8*  HCT 33.6* 30.4*  PLT 309 396   BMET  Recent Labs  10/31/14 1550 11/02/14 0535  NA 138 136  K 4.2 3.7  CL 104 102  CO2 21 21  GLUCOSE 95 93  BUN 12 10  CREATININE 0.61 0.58  CALCIUM 8.6 8.3*   PT/INR  Recent Labs  11/01/14 1610  LABPROT 15.1  INR 1.17   ABG No results for input(s): PHART, HCO3 in the last 72 hours.  Invalid input(s): PCO2, PO2  Studies/Results: Nm Hepatobiliary Including Gb  11/01/2014   CLINICAL DATA:  74 year old male status post cholecystectomy seven days ago. Abdominal pain nausea and vomiting. Query bile leak. Initial encounter.  EXAM: NUCLEAR MEDICINE HEPATOBILIARY IMAGING  TECHNIQUE: Sequential images of the abdomen were obtained out to 60 minutes following intravenous administration of radiopharmaceutical.  RADIOPHARMACEUTICALS:  5.0 [employer] VE-72C Choletec  COMPARISON:  CT Abdomen and Pelvis 10/31/2014.  FINDINGS: Prompt radiotracer uptake by the liver and fairly prompt clearance of the blood pool. Photopenia over the right lobe related to the gallbladder surgical bed collection demonstrated yesterday.  Small bowel activity by 25 minutes. Over the first hour of the exam there is prominent activity at the porta hepatis just medial to the  photopenic area. However, the study was carried out for a second our and this activity slowly resolves. Otherwise normal bowel activity, and bladder activity are noted.  IMPRESSION: No abnormal accumulation of radiotracer to indicate a bile leak.   Electronically Signed   By: Genevie Ann M.D.   On: 11/01/2014 13:26   Ct Aspiration  11/01/2014   INDICATION: History of cholecystitis, post cholecystectomy, now with indeterminate fluid collection within the gallbladder fossa worrisome for abscess or hematoma. Please perform CT-guided fluid aspiration. Please place percutaneous drainage catheter if necessary.  EXAM: CT GUIDANCE NEEDLE PLACEMENT  COMPARISON:  CT abdomen pelvis - 10/31/2014; 08/18/2014; ultrasound fluoroscopic guided cholecystostomy tube placement - 08/19/2014; ERCP - 08/25/2014; nuclear medicine HIDA scan -11/01/2014  MEDICATIONS: The patient is currently admitted to the hospital and receiving intravenous antibiotics. The antibiotics were administered within an appropriate time frame prior to the initiation of the procedure.  ANESTHESIA/SEDATION: Fentanyl 75 mcg IV; Versed 1.5 mg IV  Total Moderate Sedation time  11 minutes  CONTRAST:  None  COMPLICATIONS: None immediate  PROCEDURE: Informed written consent was obtained from the patient after a discussion of the risks, benefits and alternatives to treatment. The patient was placed supine on the CT gantry and a pre procedural CT was performed re-demonstrating the known abscess/fluid collection within the gallbladder fossa. Ultrasound scanning was performed of the gallbladder fossa however failed to adequately demonstrate the ill-defined fluid collection within the gallbladder fossa. As such, the decision was made to perform the fluid aspiration with  CT guidance. The procedure was planned. A timeout was performed prior to the initiation of the procedure.  The right lateral abdomen was prepped and draped in the usual sterile fashion. The overlying soft  tissues were anesthetized with 1% lidocaine with epinephrine. Appropriate trajectory was planned with the use of a 22 gauge spinal needle. An 18 gauge trocar needle was advanced into the fluid collection and approximately 25 cc of dark red non purulent smelling fluid was aspirated from the gallbladder fossa. A short Amplatz super stiff wire was coiled within the collection. The trocar needle was exchanged for a 10 cm Yueh sheath catheter which was utilized to aspirate an additional 25 cc of dark red, non purulent smelling fluid as the catheter was slowly withdrawn.  Postprocedural scanning and demonstrated interval reduction in the ill-defined fluid collection within the gallbladder fossa.  At this point, the procedure was terminated. A dressing was placed. The patient tolerated the procedure well without immediate postprocedural complication.  FINDINGS: Grossly unchanged appearance of ill-defined fluid collection within the gallbladder fossa with dominant component measuring approximately 5.5 x 3.4 cm (image 17, series 2).  The gallbladder fossa fluid collection was accessed with an 18 gauge needle yielding the aspiration of approximately 25 cc of dark red and non purulent smelling fluid. As this fluid collection appears to represent either a hematoma or sterile postoperative abscess, the decision was made to perform a CT-guided aspiration and not place a drain.  Ultimately, the trocar needle was exchanged for a Yueh sheath catheter which allowed the aspiration of an additional 25 cc of dark red non foul smelling fluid. All aspirated samples were capped and sent to the laboratory for analysis.  IMPRESSION: Successful CT guided aspiration of approximately 50 cc of dark red non foul smelling material from the gallbladder fossa. Given concern this collection may represent a sterile postoperative fluid collection/evolving hematoma, a percutaneous drainage catheter was not placed at this time. Samples were sent to the  laboratory as requested by the ordering clinical team.   Electronically Signed   By: Sandi Mariscal M.D.   On: 11/01/2014 16:17   Dg Femur, Min 2 Views Right  11/02/2014   CLINICAL DATA:  Pain this morning in right hip region  EXAM: RIGHT FEMUR 2 VIEWS  COMPARISON:  None  FINDINGS: The bones are osteopenic. 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 No acute fracture or subluxation identified. Diabetic type vascular calcifications are noted.  IMPRESSION: 1. Status post above the knee amputation. 2. No acute findings noted. If there is high clinical suspicion for occult fracture consider further evaluation with MRI. Although CT is expeditious, evidence is lacking regarding accuracy of CT over plain film radiography.   Electronically Signed   By: Kerby Moors M.D.   On: 11/02/2014 15:48    Anti-infectives: Anti-infectives    Start     Dose/Rate Route Frequency Ordered Stop   11/01/14 0400  piperacillin-tazobactam (ZOSYN) IVPB 3.375 g     3.375 g 12.5 mL/hr over 240 Minutes Intravenous 3 times per day 11/01/14 0038     10/31/14 2000  piperacillin-tazobactam (ZOSYN) IVPB 3.375 g     3.375 g 100 mL/hr over 30 Minutes Intravenous  Once 10/31/14 1957 10/31/14 2049      Assessment/Plan:  Post op pain  CT aspiration remains negative.  I think he can be changed to Augmentin and would probably continue for 10 days Ok to resume ASA  from surgery standpoint  LOS: 3 days    Jaime [name] A 11/03/2014

## 2014-11-04 ENCOUNTER — Telehealth: Payer: Self-pay | Admitting: *Deleted

## 2014-11-04 NOTE — Telephone Encounter (Signed)
Transition Care Management Follow-up Telephone Call How have you been since you were released from the hospital?  Per wife- Much better, eating well, he has has a BM, pain is minimal and he is in good spirits, no fevers    Do you understand why you were in the hospital? YES    Do you understand the discharge instrcutions? YES   Items Reviewed:  Medications reviewed: YES   Allergies reviewed: YES   Dietary changes reviewed: YES   Referrals reviewed: YES   Functional Questionnaire:   Activities of Daily Living (ADLs):   He states they are independent in the following: none- patient is chair-bound, they have [employer] and wife is helping States they require assistance with the following: cooking, transferring, dressing, bathing, restroom   Any transportation issues/concerns?: NO- wife will be driving    Any patient concerns? NO   Confirmed importance and date/time of follow-up visits scheduled: YES- follow-up scheduled with Dr. Larose Kells 11/07/14 at 11:30.     Confirmed with patient if condition begins to worsen call PCP or go to the ER.  Patient was given the Call-a-Nurse line [phone]: YES

## 2014-11-05 LAB — CULTURE, ROUTINE-ABSCESS: Culture: NO GROWTH

## 2014-11-07 ENCOUNTER — Ambulatory Visit: Payer: TRICARE For Life (TFL) | Admitting: Internal Medicine

## 2014-11-07 ENCOUNTER — Other Ambulatory Visit: Payer: Self-pay | Admitting: Internal Medicine

## 2014-11-07 ENCOUNTER — Telehealth: Payer: Self-pay | Admitting: *Deleted

## 2014-11-07 LAB — CULTURE, BLOOD (ROUTINE X 2)
CULTURE: NO GROWTH
Culture: NO GROWTH

## 2014-11-07 NOTE — Telephone Encounter (Signed)
Home health certification and plan of care received via fax from [facility]. Forwarded to Dr. Larose Kells. JG//CMA

## 2014-11-08 ENCOUNTER — Ambulatory Visit (HOSPITAL_BASED_OUTPATIENT_CLINIC_OR_DEPARTMENT_OTHER)
Admission: RE | Admit: 2014-11-08 | Discharge: 2014-11-08 | Disposition: A | Discharge status: Home / Self Care | Payer: Medicare Other | Source: Ambulatory Visit | Attending: Internal Medicine | Admitting: Internal Medicine

## 2014-11-08 ENCOUNTER — Ambulatory Visit (INDEPENDENT_AMBULATORY_CARE_PROVIDER_SITE_OTHER): Payer: Medicare Other | Admitting: Internal Medicine

## 2014-11-08 VITALS — BP 122/84 | HR 87 | Temp 98.0°F

## 2014-11-08 DIAGNOSIS — C220 Liver cell carcinoma: Secondary | ICD-10-CM

## 2014-11-08 DIAGNOSIS — R0602 Shortness of breath: Secondary | ICD-10-CM | POA: Diagnosis present

## 2014-11-08 DIAGNOSIS — D62 Acute posthemorrhagic anemia: Secondary | ICD-10-CM | POA: Diagnosis not present

## 2014-11-08 DIAGNOSIS — K819 Cholecystitis, unspecified: Secondary | ICD-10-CM

## 2014-11-08 DIAGNOSIS — R918 Other nonspecific abnormal finding of lung field: Secondary | ICD-10-CM | POA: Diagnosis not present

## 2014-11-08 DIAGNOSIS — I517 Cardiomegaly: Secondary | ICD-10-CM | POA: Diagnosis not present

## 2014-11-08 DIAGNOSIS — J9811 Atelectasis: Secondary | ICD-10-CM | POA: Diagnosis not present

## 2014-11-08 LAB — CBC WITH DIFFERENTIAL/PLATELET
Basophils Absolute: 0 10*3/uL (ref 0.0–0.1)
Basophils Relative: 0.6 % (ref 0.0–3.0)
EOS PCT: 1.8 % (ref 0.0–5.0)
Eosinophils Absolute: 0.1 10*3/uL (ref 0.0–0.7)
HCT: 37 % — ABNORMAL LOW (ref 39.0–52.0)
HEMOGLOBIN: 12 g/dL — AB (ref 13.0–17.0)
LYMPHS PCT: 26.3 % (ref 12.0–46.0)
Lymphs Abs: 1.9 10*3/uL (ref 0.7–4.0)
MCHC: 32.4 g/dL (ref 30.0–36.0)
MCV: 77.8 fl — ABNORMAL LOW (ref 78.0–100.0)
MONO ABS: 0.5 10*3/uL (ref 0.1–1.0)
Monocytes Relative: 6.3 % (ref 3.0–12.0)
NEUTROS PCT: 65 % (ref 43.0–77.0)
Neutro Abs: 4.8 10*3/uL (ref 1.4–7.7)
PLATELETS: 526 10*3/uL — AB (ref 150.0–400.0)
RBC: 4.76 Mil/uL (ref 4.22–5.81)
RDW: 18.2 % — ABNORMAL HIGH (ref 11.5–15.5)
WBC: 7.4 10*3/uL (ref 4.0–10.5)

## 2014-11-08 NOTE — Progress Notes (Signed)
Pre visit review using our clinic review tool, if applicable. No additional management support is needed unless otherwise documented below in the visit note. 

## 2014-11-08 NOTE — Patient Instructions (Signed)
Get your blood work before you leave   Stop by the first floor and get the XR     Come back to the office in 3-4 months   for a routine check up

## 2014-11-08 NOTE — Progress Notes (Signed)
Subjective:    Patient ID: Jaime [name], male    DOB: 05-07-1941, 74 y.o.   MRN: [id number]  DOS:  11/08/2014 Type of visit - description : Hospital follow-up Interval history: The patient is status post elective  laparoscopic cholecystectomy 10/25/2014, he was subsequently admitted to the hospital  stay there for 3 days d/t a abdominal pain, seroma, hematoma post surgery. Discharge summary is reviewed. Labs and x-rays reviewed as well.     Review of Systems Since he left the hospital he is doing well. Denies fever chills No chest pain or difficulty breathing No cough or sputum production Taking Augmentin as prescribed. Appetite is good , bowel movements are regular.  Past Medical History  Diagnosis Date  . Hyperlipidemia   . Cardiomyopathy, ischemic     EF 45% per ECHO 2008  //   EF 25%, echo, August, 2013 //  Echo (8/15):  Mild LVH, EF 30-35%, ant-lat and lat AK, inf HK, Gr 1 DD, mild MR, mild LAE  . Pulmonary hypertension     moderate ECHO Jan 2008  . Systolic heart failure   . Multiple sclerosis [place] -- LAST VISIT 11-20-2010  NOTE W/ CHART  . Lung nodule     resolved 11-2006 CT Chest  . Tobacco abuse     quit   . Increased prostate specific antigen (PSA) velocity   . Urinary retention     dx ~ 2-12, like from [place], now with a catheter, saw urology  . Carotid artery disease     a.  Doppler, February, 2012, 0-39% bilateral,Mild smooth plaque;  b.  Carotid US (8/15):  Bilateral 1-39% ICA >>> F/u 2 years  . Chronic indwelling Foley catheter   . Scoliosis associated with other condition   . Hemiparesis   . Fatigue SEVERE  . Impotence   . MI, acute, non ST segment elevation 2007    S/P PCI WITH X1 STENT (TAXUS DRUG-ELUTING) LEFT CIRCUMFLEX  . Hypertension   . H/O pleural effusion 2008    POST THORACENTESIS  . History of colon polyps PRECANCEROUS  . Insomnia   . CAD (coronary artery disease) CARDIOLOGIST- DR KATZ-- VISIT 06-05-2011  IN [facility]    non-STEMI, 2007.Marland Kitchenoccluded circumflex.. Taxus stent placed...residual 80% LAD...50% RCA  . Urinary tract infection     hx of  . Atherosclerotic PVD with ulceration     left foot  . PAC (premature atrial contraction)     December, 2013  . CHF (congestive heart failure)   . Diabetes mellitus     INSULIN-DEPENDent  . Dementia   . Constipation   . TIA (transient ischemic attack)   . Sleep apnea   . Neuromuscular disorder     MS    Past Surgical History  Procedure Laterality Date  . Hernia repair  1990    (R)  . Thoracentesis  2008    PLEURAL EFFUSION  . Cystoscopy  07/30/2011    Procedure: CYSTOSCOPY;  Surgeon: Hanley Ben, MD;  Location[facility];  Service: Urology;  Laterality: N/A;  . Transurethral resection of prostate  07/30/2011    Procedure: TRANSURETHRAL RESECTION OF THE PROSTATE (TURP);  Surgeon: Hanley Ben, MD;  Location[facility];  Service: Urology;  Laterality: N/A;  . Femoral-tibial bypass graft  03/11/2012    Procedure: BYPASS GRAFT FEMORAL-TIBIAL ARTERY;  Surgeon: Conrad Lancaster, MD;  Location: [place];  Service: Vascular;  Laterality: Left;  Left Femoral -Tibial trunk bypass, Endarterectomy of Tibial- Peroneal trunk with vein angioplasty.  . Intraoperative arteriogram  03/11/2012    Procedure: INTRA OPERATIVE ARTERIOGRAM;  Surgeon: Conrad Marathon, MD;  Location: [place];  Service: Vascular;  Laterality: Left;  . Femoral-popliteal bypass graft  03/11/2012    Procedure: BYPASS GRAFT FEMORAL-POPLITEAL ARTERY;  Surgeon: Conrad Parkdale, MD;  Location: [place];  Service: Vascular;  Laterality: Left;  embolectomy left lower leg  . Amputation  03/17/2012    Procedure: AMPUTATION ABOVE KNEE;  Surgeon: Conrad Candler-McAfee, MD;  Location: [place];  Service: Vascular;  Laterality: Left;  . Coronary angioplasty with stent placement  05-01-2006    OCCLUDED CIRCUMFLEX -- TAXUS STENT PLACMENT  AND RESIDUAL 80% LAD,  50% RCA  . Amputation  06/03/2012     Procedure: AMPUTATION ABOVE KNEE;  Surgeon: Conrad Cranston, MD;  Location: [place];  Service: Vascular;  Laterality: Right;  . Lower extremity angiogram Bilateral 12/17/2011    Procedure: LOWER EXTREMITY ANGIOGRAM;  Surgeon: Serafina Mitchell, MD;  Location[facility] CATH LAB;  Service: Cardiovascular;  Laterality: Bilateral;  bil lower extrem angio  . Abdominal angiogram  12/17/2011    Procedure: ABDOMINAL ANGIOGRAM;  Surgeon: Serafina Mitchell, MD;  Location: [facility] CATH LAB;  Service: Cardiovascular;;  . Abdominal aortagram N/A 08/19/2013    Procedure: ABDOMINAL Maxcine Ham;  Surgeon: Conrad Bovey, MD;  [facility] CATH LAB;  Service: Cardiovascular;  Laterality: N/A;  . Ercp N/A 08/25/2014    Procedure: ENDOSCOPIC RETROGRADE CHOLANGIOPANCREATOGRAPHY (ERCP);  Surgeon: Ladene Artist, MD;  Location: [facility] ENDOSCOPY;  Service: Endoscopy;  Laterality: N/A;  . 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 Above knee leg amputation Right     05/2012  . Cholecystectomy  10/25/2014    DR [name]  . Cholecystectomy N/A 10/25/2014    Procedure: LAPAROSCOPIC CHOLECYSTECTOMY ;  Surgeon: Coralie Keens, MD;  Location: [place];  Service: General;  Laterality: N/A;    History   Social History  . Marital Status: Married    Spouse Name: N/A  . Number of Children: 2  . Years of Education: N/A   Occupational History  . disable    Social History Main Topics  . Smoking status: Former Smoker -- 70 years    Quit date: 07/22/2012  . Smokeless tobacco: Current User     Comment: pt states that he is using E-cigs  . Alcohol Use: No  . Drug Use: No  . Sexual Activity: No     Comment: electronic cigarettes no nicotene   Other Topics Concern  . Not on file   Social History Narrative   Lives w/ wife        Medication List       This list is accurate as of: 11/08/14  7:23 PM.  Always use your most recent med list.               amoxicillin-clavulanate 875-125 MG per tablet  Commonly known as:  AUGMENTIN  Take 1 tablet  by mouth 2 (two) times daily.     aspirin EC 325 MG tablet  Take 325 mg by mouth daily.     atorvastatin 40 MG tablet  Commonly known as:  LIPITOR  TAKE 1 TABLET (40 MG TOTAL) BY MOUTH AT BEDTIME.     baclofen 10 MG tablet  Commonly known as:  LIORESAL  TAKE 1/2 TABLET BY MOUTH TWICE A DAY     diclofenac sodium 1 %  Gel  Commonly known as:  VOLTAREN  Apply 1 application topically 2 (two) times daily as needed (pain). For pain     ferrous sulfate 325 (65 FE) MG tablet  Take 325 mg by mouth daily with breakfast.     GLUCAGEN 1 MG Solr injection  Generic drug:  glucagon  Inject 1 mg into the vein once as needed for low blood sugar.     insulin aspart protamine- aspart (70-30) 100 UNIT/ML injection  Commonly known as:  NOVOLOG MIX 70/30  Inject 0.05 mLs (5 Units total) into the skin 2 (two) times daily with a meal.     lisinopril 10 MG tablet  Commonly known as:  PRINIVIL,ZESTRIL  TAKE 1 TABLET BY MOUTH EVERY DAY     metoprolol succinate 25 MG 24 hr tablet  Commonly known as:  TOPROL-XL  Take 1 tablet (25 mg total) by mouth daily.     multivitamin with minerals tablet  Take 1 tablet by mouth daily.     oxyCODONE-acetaminophen 5-325 MG per tablet  Commonly known as:  PERCOCET/ROXICET  Take 1-2 tablets by mouth every 6 (six) hours as needed for moderate pain (for abdominal pain.).     pantoprazole 40 MG tablet  Commonly known as:  PROTONIX  TAKE 1 TABLET BY MOUTH EVERY DAY AT 12 NOON           Objective:   Physical Exam BP 122/84 mmHg  Pulse 87  Temp(Src) 98 F (36.7 C) (Oral)  Ht   Wt   SpO2 95% General:   Well developed, well nourished . NAD.  HEENT:  Normocephalic . Face symmetric, atraumatic Lungs:  Decreased breath sounds at bases, worse on the right Normal respiratory effort, no intercostal retractions, no accessory muscle use. Heart: RRR,  no murmur.  Abdomen, soft, not distended, nontender. Neurologic:  alert & oriented X3.  Speech  normal Psych--  Cognition and judgment appear intact.  Cooperative with normal attention span and concentration.  Behavior appropriate. No anxious or depressed appearing.        Assessment & Plan:    Status post admission to the hospital for the following problems: -Seroma post GB surgery, blood cultures and fluid cultures negative, on Augmentin, doing well, has an appointment to see surgery 11-18-14 On exam, breath sounds are slightly decreased mostly on the right side, will check a chest x-ray, atelectasis? -transient elevation of troponins, plan is to see cardiology, currently asymptomatic -Anemia, check a CBC  Had presyncope before recent admission, no further symptoms

## 2014-11-09 ENCOUNTER — Telehealth: Payer: Self-pay | Admitting: Internal Medicine

## 2014-11-09 DIAGNOSIS — Z48815 Encounter for surgical aftercare following surgery on the digestive system: Secondary | ICD-10-CM | POA: Diagnosis not present

## 2014-11-09 DIAGNOSIS — G35 Multiple sclerosis: Secondary | ICD-10-CM | POA: Diagnosis not present

## 2014-11-09 DIAGNOSIS — S88912D Complete traumatic amputation of left lower leg, level unspecified, subsequent encounter: Secondary | ICD-10-CM | POA: Diagnosis not present

## 2014-11-09 DIAGNOSIS — E1159 Type 2 diabetes mellitus with other circulatory complications: Secondary | ICD-10-CM | POA: Diagnosis not present

## 2014-11-09 DIAGNOSIS — S88911D Complete traumatic amputation of right lower leg, level unspecified, subsequent encounter: Secondary | ICD-10-CM | POA: Diagnosis not present

## 2014-11-09 DIAGNOSIS — G8190 Hemiplegia, unspecified affecting unspecified side: Secondary | ICD-10-CM | POA: Diagnosis not present

## 2014-11-09 NOTE — Telephone Encounter (Signed)
Caller name: Charlynn Court RN with [facility] Relation to pt: Call back number: [phone] Pharmacy:  Reason for call:   Resumption of care because patient was in hospital for a short period of time. nursing is going to be in home between 4-5 weeks, PT is going to assess and treat as needed, Home health aid twice a week for three weeks.

## 2014-11-09 NOTE — Telephone Encounter (Signed)
Tried calling Rodman Key back, unable to contact him. Will try contacting again.

## 2014-11-10 ENCOUNTER — Telehealth: Payer: Self-pay | Admitting: Internal Medicine

## 2014-11-10 NOTE — Telephone Encounter (Signed)
Per Dr. Larose Kells, forms need to be reviewed/signed by surgeon. Faxed [facility] informing them of this. JG//CMA

## 2014-11-10 NOTE — Telephone Encounter (Signed)
Pt was no show for appt on 11/07/14- no letter or charge sent - RN has attempted follow up with PT

## 2014-11-11 DIAGNOSIS — S88912D Complete traumatic amputation of left lower leg, level unspecified, subsequent encounter: Secondary | ICD-10-CM | POA: Diagnosis not present

## 2014-11-11 DIAGNOSIS — E1159 Type 2 diabetes mellitus with other circulatory complications: Secondary | ICD-10-CM | POA: Diagnosis not present

## 2014-11-11 DIAGNOSIS — Z48815 Encounter for surgical aftercare following surgery on the digestive system: Secondary | ICD-10-CM | POA: Diagnosis not present

## 2014-11-11 DIAGNOSIS — G35 Multiple sclerosis: Secondary | ICD-10-CM | POA: Diagnosis not present

## 2014-11-11 DIAGNOSIS — S88911D Complete traumatic amputation of right lower leg, level unspecified, subsequent encounter: Secondary | ICD-10-CM | POA: Diagnosis not present

## 2014-11-11 DIAGNOSIS — G8190 Hemiplegia, unspecified affecting unspecified side: Secondary | ICD-10-CM | POA: Diagnosis not present

## 2014-11-11 NOTE — Telephone Encounter (Signed)
Verbals given to Rodman Key at [facility].

## 2014-11-12 DIAGNOSIS — S88911D Complete traumatic amputation of right lower leg, level unspecified, subsequent encounter: Secondary | ICD-10-CM | POA: Diagnosis not present

## 2014-11-12 DIAGNOSIS — S88912D Complete traumatic amputation of left lower leg, level unspecified, subsequent encounter: Secondary | ICD-10-CM | POA: Diagnosis not present

## 2014-11-12 DIAGNOSIS — G35 Multiple sclerosis: Secondary | ICD-10-CM | POA: Diagnosis not present

## 2014-11-12 DIAGNOSIS — E1159 Type 2 diabetes mellitus with other circulatory complications: Secondary | ICD-10-CM | POA: Diagnosis not present

## 2014-11-12 DIAGNOSIS — G8190 Hemiplegia, unspecified affecting unspecified side: Secondary | ICD-10-CM | POA: Diagnosis not present

## 2014-11-12 DIAGNOSIS — Z48815 Encounter for surgical aftercare following surgery on the digestive system: Secondary | ICD-10-CM | POA: Diagnosis not present

## 2014-11-14 ENCOUNTER — Ambulatory Visit: Payer: TRICARE For Life (TFL) | Admitting: Internal Medicine

## 2014-11-15 ENCOUNTER — Encounter: Payer: Medicare Other | Admitting: Internal Medicine

## 2014-11-15 DIAGNOSIS — Z48815 Encounter for surgical aftercare following surgery on the digestive system: Secondary | ICD-10-CM | POA: Diagnosis not present

## 2014-11-15 DIAGNOSIS — E1159 Type 2 diabetes mellitus with other circulatory complications: Secondary | ICD-10-CM | POA: Diagnosis not present

## 2014-11-15 DIAGNOSIS — S88912D Complete traumatic amputation of left lower leg, level unspecified, subsequent encounter: Secondary | ICD-10-CM | POA: Diagnosis not present

## 2014-11-15 DIAGNOSIS — S88911D Complete traumatic amputation of right lower leg, level unspecified, subsequent encounter: Secondary | ICD-10-CM | POA: Diagnosis not present

## 2014-11-15 DIAGNOSIS — G35 Multiple sclerosis: Secondary | ICD-10-CM | POA: Diagnosis not present

## 2014-11-15 DIAGNOSIS — G8190 Hemiplegia, unspecified affecting unspecified side: Secondary | ICD-10-CM | POA: Diagnosis not present

## 2014-11-17 DIAGNOSIS — S88911D Complete traumatic amputation of right lower leg, level unspecified, subsequent encounter: Secondary | ICD-10-CM | POA: Diagnosis not present

## 2014-11-17 DIAGNOSIS — G8190 Hemiplegia, unspecified affecting unspecified side: Secondary | ICD-10-CM | POA: Diagnosis not present

## 2014-11-17 DIAGNOSIS — S88912D Complete traumatic amputation of left lower leg, level unspecified, subsequent encounter: Secondary | ICD-10-CM | POA: Diagnosis not present

## 2014-11-17 DIAGNOSIS — G35 Multiple sclerosis: Secondary | ICD-10-CM | POA: Diagnosis not present

## 2014-11-17 DIAGNOSIS — E1159 Type 2 diabetes mellitus with other circulatory complications: Secondary | ICD-10-CM | POA: Diagnosis not present

## 2014-11-17 DIAGNOSIS — Z48815 Encounter for surgical aftercare following surgery on the digestive system: Secondary | ICD-10-CM | POA: Diagnosis not present

## 2014-11-21 ENCOUNTER — Other Ambulatory Visit: Payer: Self-pay | Admitting: Neurology

## 2014-11-21 DIAGNOSIS — G8190 Hemiplegia, unspecified affecting unspecified side: Secondary | ICD-10-CM | POA: Diagnosis not present

## 2014-11-21 DIAGNOSIS — Z48815 Encounter for surgical aftercare following surgery on the digestive system: Secondary | ICD-10-CM | POA: Diagnosis not present

## 2014-11-21 DIAGNOSIS — G35 Multiple sclerosis: Secondary | ICD-10-CM | POA: Diagnosis not present

## 2014-11-21 DIAGNOSIS — E1159 Type 2 diabetes mellitus with other circulatory complications: Secondary | ICD-10-CM | POA: Diagnosis not present

## 2014-11-21 DIAGNOSIS — S88911D Complete traumatic amputation of right lower leg, level unspecified, subsequent encounter: Secondary | ICD-10-CM | POA: Diagnosis not present

## 2014-11-21 DIAGNOSIS — S88912D Complete traumatic amputation of left lower leg, level unspecified, subsequent encounter: Secondary | ICD-10-CM | POA: Diagnosis not present

## 2014-11-22 DIAGNOSIS — G8190 Hemiplegia, unspecified affecting unspecified side: Secondary | ICD-10-CM | POA: Diagnosis not present

## 2014-11-22 DIAGNOSIS — S88911D Complete traumatic amputation of right lower leg, level unspecified, subsequent encounter: Secondary | ICD-10-CM | POA: Diagnosis not present

## 2014-11-22 DIAGNOSIS — S88912D Complete traumatic amputation of left lower leg, level unspecified, subsequent encounter: Secondary | ICD-10-CM | POA: Diagnosis not present

## 2014-11-22 DIAGNOSIS — E1159 Type 2 diabetes mellitus with other circulatory complications: Secondary | ICD-10-CM | POA: Diagnosis not present

## 2014-11-22 DIAGNOSIS — Z48815 Encounter for surgical aftercare following surgery on the digestive system: Secondary | ICD-10-CM | POA: Diagnosis not present

## 2014-11-22 DIAGNOSIS — G35 Multiple sclerosis: Secondary | ICD-10-CM | POA: Diagnosis not present

## 2014-11-24 DIAGNOSIS — S88912D Complete traumatic amputation of left lower leg, level unspecified, subsequent encounter: Secondary | ICD-10-CM | POA: Diagnosis not present

## 2014-11-24 DIAGNOSIS — E1159 Type 2 diabetes mellitus with other circulatory complications: Secondary | ICD-10-CM | POA: Diagnosis not present

## 2014-11-24 DIAGNOSIS — G8190 Hemiplegia, unspecified affecting unspecified side: Secondary | ICD-10-CM | POA: Diagnosis not present

## 2014-11-24 DIAGNOSIS — Z48815 Encounter for surgical aftercare following surgery on the digestive system: Secondary | ICD-10-CM | POA: Diagnosis not present

## 2014-11-24 DIAGNOSIS — S88911D Complete traumatic amputation of right lower leg, level unspecified, subsequent encounter: Secondary | ICD-10-CM | POA: Diagnosis not present

## 2014-11-24 DIAGNOSIS — G35 Multiple sclerosis: Secondary | ICD-10-CM | POA: Diagnosis not present

## 2014-11-29 DIAGNOSIS — E1159 Type 2 diabetes mellitus with other circulatory complications: Secondary | ICD-10-CM | POA: Diagnosis not present

## 2014-11-29 DIAGNOSIS — S88911D Complete traumatic amputation of right lower leg, level unspecified, subsequent encounter: Secondary | ICD-10-CM | POA: Diagnosis not present

## 2014-11-29 DIAGNOSIS — G35 Multiple sclerosis: Secondary | ICD-10-CM | POA: Diagnosis not present

## 2014-11-29 DIAGNOSIS — G8190 Hemiplegia, unspecified affecting unspecified side: Secondary | ICD-10-CM | POA: Diagnosis not present

## 2014-11-29 DIAGNOSIS — Z48815 Encounter for surgical aftercare following surgery on the digestive system: Secondary | ICD-10-CM | POA: Diagnosis not present

## 2014-11-29 DIAGNOSIS — S88912D Complete traumatic amputation of left lower leg, level unspecified, subsequent encounter: Secondary | ICD-10-CM | POA: Diagnosis not present

## 2014-12-02 DIAGNOSIS — G35 Multiple sclerosis: Secondary | ICD-10-CM | POA: Diagnosis not present

## 2014-12-02 DIAGNOSIS — G8190 Hemiplegia, unspecified affecting unspecified side: Secondary | ICD-10-CM | POA: Diagnosis not present

## 2014-12-02 DIAGNOSIS — S88912D Complete traumatic amputation of left lower leg, level unspecified, subsequent encounter: Secondary | ICD-10-CM | POA: Diagnosis not present

## 2014-12-02 DIAGNOSIS — Z48815 Encounter for surgical aftercare following surgery on the digestive system: Secondary | ICD-10-CM | POA: Diagnosis not present

## 2014-12-02 DIAGNOSIS — S88911D Complete traumatic amputation of right lower leg, level unspecified, subsequent encounter: Secondary | ICD-10-CM | POA: Diagnosis not present

## 2014-12-02 DIAGNOSIS — E1159 Type 2 diabetes mellitus with other circulatory complications: Secondary | ICD-10-CM | POA: Diagnosis not present

## 2014-12-07 DIAGNOSIS — H25099 Other age-related incipient cataract, unspecified eye: Secondary | ICD-10-CM | POA: Diagnosis not present

## 2014-12-07 DIAGNOSIS — G35 Multiple sclerosis: Secondary | ICD-10-CM | POA: Diagnosis not present

## 2014-12-07 DIAGNOSIS — E109 Type 1 diabetes mellitus without complications: Secondary | ICD-10-CM | POA: Diagnosis not present

## 2014-12-08 ENCOUNTER — Ambulatory Visit (INDEPENDENT_AMBULATORY_CARE_PROVIDER_SITE_OTHER): Payer: Medicare Other | Admitting: Neurology

## 2014-12-08 ENCOUNTER — Encounter: Payer: Self-pay | Admitting: Neurology

## 2014-12-08 VITALS — BP 140/82 | HR 60 | Resp 18

## 2014-12-08 DIAGNOSIS — G8220 Paraplegia, unspecified: Principal | ICD-10-CM

## 2014-12-08 DIAGNOSIS — G35 Multiple sclerosis: Secondary | ICD-10-CM | POA: Diagnosis not present

## 2014-12-08 DIAGNOSIS — G546 Phantom limb syndrome with pain: Secondary | ICD-10-CM | POA: Diagnosis not present

## 2014-12-08 DIAGNOSIS — G8389 Other specified paralytic syndromes: Secondary | ICD-10-CM

## 2014-12-08 DIAGNOSIS — G809 Cerebral palsy, unspecified: Secondary | ICD-10-CM

## 2014-12-08 MED ORDER — PREGABALIN 50 MG PO CAPS: 50.0000 mg | ORAL_CAPSULE | Freq: Three times a day (TID) | ORAL | Status: DC

## 2014-12-08 NOTE — Progress Notes (Signed)
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  He is accompanied by his wife who provides some history.  Records reviewed.  UPDATE: He had cholecystectomy last month.  He has been taking Percocet.  He also takes Tylenol 4, not prescribed by me.  Pain is improved but he still has jolts of pain during the day.  HISTORY: He was diagnosed with MS in 1993.  Initially, he developed right arm and leg weakness along with balance problems.  Symptoms initially improved but had progressed over the years.  He was on multiple immunomodulating agents such as Betaseron, Copaxone, Cytoxin and monthly Solumedrol.  He currently is not on an agent and hasn't had a flare up in 4 years.  He has bilateral ATK ambutations performed in 2013 as a consequence of his peripheral vascular and diabetes.  He requires assistance with bathing and dressing, but he is able to feed himself.  He uses a power wheelchair.  He had been on amantadine for many years.  MRI Brain w/wo from 11/29/04 demonstrated "extensive white matter disease in the supratentorial compartment, which is consistent with advanced multiple sclerosis.  There is confluence of the white matter lesions with atrophy and hydrocephalus, thinned corpus callosum, and black holes, however somewhat unusual for MS of this degree, no infrantentorial lesions are seen and no active lesions are identified by means of contrast." CT of head was performed on 03/23/12 for encephalopathy, which revealed atrophy and nonspecific small vessel ischemic changes.  He has [place].  More recently, he had been experiencing vivid nightmares.  It was believed to be due to low blood sugars or codeine.  On 11/01/13, he presented to the ED with  severe right phantom limb pain, described as muscle spasms like a Charley horse, that was intermittent and lasting 10 minutes at a time.  Labs were unremarkable, except for WBCs in the urine, however he was recently treated for UTI.  He was given Percocet and Valium, which helped.  Tylenol 3 and 4 do not help.  He currently is taking Percocet which helps.  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  He has some poor short-term memory. Sometimes he calls his wife by his mother's name.  He cannot always remember the names of his great grandchildren.  He has more difficulty figuring out how to maneuver the motorized wheelchair.  Medications include:  Tylenol #4, baclofen.  Gabapentin was ineffective.  Amantadine was discontinued due to nightmares.  PAST MEDICAL HISTORY: Past Medical History  Diagnosis Date  . Hyperlipidemia   . Cardiomyopathy, ischemic     EF 45% per ECHO 2008  //   EF 25%, echo, August, 2013 //  Echo (8/15):  Mild LVH, EF 30-35%, ant-lat and lat AK, inf HK, Gr 1 DD, mild MR, mild LAE  . Pulmonary hypertension     moderate ECHO Jan 2008  . Systolic heart failure   . Multiple sclerosis [place] -- LAST VISIT 11-20-2010  NOTE W/ CHART  . Lung nodule     resolved 11-2006 CT Chest  . Tobacco abuse     quit   . Increased prostate specific antigen (PSA) velocity   . Urinary retention     dx ~ 2-12,  like from [place], now with a catheter, saw urology  . Carotid artery disease     a.  Doppler, February, 2012, 0-39% bilateral,Mild smooth plaque;  b.  Carotid US (8/15):  Bilateral 1-39% ICA >>> F/u 2 years  . Chronic indwelling Foley catheter   . Scoliosis associated with other condition   . Hemiparesis   . Fatigue SEVERE  . Impotence   . MI, acute, non ST segment elevation 2007    S/P PCI WITH X1 STENT (TAXUS DRUG-ELUTING) LEFT CIRCUMFLEX  . Hypertension   . H/O pleural effusion 2008    POST THORACENTESIS  . History of colon polyps PRECANCEROUS  .  Insomnia   . CAD (coronary artery disease) CARDIOLOGIST- DR KATZ-- VISIT 06-05-2011 IN [facility]    non-STEMI, 2007.Marland Kitchenoccluded circumflex.. Taxus stent placed...residual 80% LAD...50% RCA  . Urinary tract infection     hx of  . Atherosclerotic PVD with ulceration     left foot  . PAC (premature atrial contraction)     December, 2013  . CHF (congestive heart failure)   . Diabetes mellitus     INSULIN-DEPENDent  . Dementia   . Constipation   . TIA (transient ischemic attack)   . Sleep apnea   . Neuromuscular disorder     MS    MEDICATIONS: Current Outpatient Prescriptions on File Prior to Visit  Medication Sig Dispense Refill  . amoxicillin-clavulanate (AUGMENTIN) 875-125 MG per tablet Take 1 tablet by mouth 2 (two) times daily. 14 tablet 0  . aspirin EC 325 MG tablet Take 325 mg by mouth daily.    Marland Kitchen atorvastatin (LIPITOR) 40 MG tablet TAKE 1 TABLET (40 MG TOTAL) BY MOUTH AT BEDTIME. 90 tablet 1  . baclofen (LIORESAL) 10 MG tablet TAKE 1/2 TABLET BY MOUTH TWICE A DAY 30 tablet 0  . diclofenac sodium (VOLTAREN) 1 % GEL Apply 1 application topically 2 (two) times daily as needed (pain). For pain    . ferrous sulfate 325 (65 FE) MG tablet Take 325 mg by mouth daily with breakfast.    . glucagon (GLUCAGEN) 1 MG SOLR injection Inject 1 mg into the vein once as needed for low blood sugar.    . insulin aspart protamine- aspart (NOVOLOG MIX 70/30) (70-30) 100 UNIT/ML injection Inject 0.05 mLs (5 Units total) into the skin 2 (two) times daily with a meal. 10 mL 0  . lisinopril (PRINIVIL,ZESTRIL) 10 MG tablet TAKE 1 TABLET BY MOUTH EVERY DAY 30 tablet 5  . metoprolol succinate (TOPROL-XL) 25 MG 24 hr tablet Take 1 tablet (25 mg total) by mouth daily. 30 tablet 3  . Multiple Vitamins-Minerals (MULTIVITAMIN WITH MINERALS) tablet Take 1 tablet by mouth daily.    Marland Kitchen oxyCODONE-acetaminophen (PERCOCET/ROXICET) 5-325 MG per tablet Take 1-2 tablets by mouth every 6 (six) hours as needed for moderate pain (for  abdominal pain.). 30 tablet 0  . pantoprazole (PROTONIX) 40 MG tablet TAKE 1 TABLET BY MOUTH EVERY DAY AT 12 NOON 90 tablet 2   No current facility-administered medications on file prior to visit.    ALLERGIES: Allergies  Allergen Reactions  . Bee Venom Anaphylaxis  . Influenza Vaccines Other (See Comments)    Sick for months    FAMILY HISTORY: Family History  Problem Relation Age of Onset  . Hypertension    . Heart attack Mother 75  . Heart disease Mother   . Stroke Mother   . Hyperlipidemia Mother   . Hypertension Mother   . Colon cancer Neg Hx   .  Prostate cancer Neg Hx   . COPD Father   . Peripheral vascular disease Father   . Diabetes Brother   . Heart disease Brother   . Hypertension Brother   . Heart attack Brother   . Diabetes Daughter     SOCIAL HISTORY: History   Social History  . Marital Status: Married    Spouse Name: N/A  . Number of Children: 2  . Years of Education: N/A   Occupational History  . disable    Social History Main Topics  . Smoking status: Former Smoker -- 39 years    Quit date: 07/22/2012  . Smokeless tobacco: Current User     Comment: pt states that he is using E-cigs  . Alcohol Use: No  . Drug Use: No  . Sexual Activity: No     Comment: electronic cigarettes no nicotene   Other Topics Concern  . Not on file   Social History Narrative   Lives w/ wife    REVIEW OF SYSTEMS: Constitutional: No fevers, chills, or sweats, no generalized fatigue, change in appetite Eyes: No visual changes, double vision, eye pain Ear, nose and throat: No hearing loss, ear pain, nasal congestion, sore throat Cardiovascular: No chest pain, palpitations Respiratory:  No shortness of breath at rest or with exertion, wheezes GastrointestinaI: No nausea, vomiting, diarrhea, abdominal pain, fecal incontinence Genitourinary:  No dysuria, urinary retention or frequency Musculoskeletal:  No neck pain, back pain Integumentary: No rash, pruritus, skin  lesions Neurological: as above Psychiatric: No depression, insomnia, anxiety Endocrine: No palpitations, fatigue, diaphoresis, mood swings, change in appetite, change in weight, increased thirst Hematologic/Lymphatic:  No anemia, purpura, petechiae. Allergic/Immunologic: no itchy/runny eyes, nasal congestion, recent allergic reactions, rashes  PHYSICAL EXAM: Filed Vitals:   12/08/14 0921  BP: 140/82  Pulse: 60  Resp: 18   General: No acute distress Head:  Normocephalic/atraumatic Eyes:  Fundoscopic exam unremarkable without vessel changes, exudates, hemorrhages or papilledema. Neck: supple, no paraspinal tenderness, full range of motion Heart:  Regular rate and rhythm Lungs:  Clear to auscultation bilaterally Back: No paraspinal tenderness Neurological Exam: alert and oriented to person, place, and time. Attention span and concentration intact, recent and remote memory intact, fund of knowledge intact.  Speech fluent and not dysarthric, language intact.  CN II-XII intact. Fundi not able to be visualized on inspection.  Mildly inncreased tone in upper extremities, 5-/5 right triceps, otherwise muscle strength 5/5 in upper extremities.  Sensation to light touch, temperature and vibration intact.  Deep tendon reflexes 2+ in upper extremities.  Finger to nose intact.   IMPRESSION: Secondary progressive MS, clinically stable Phantom limb pain  PLAN: 1.  Continue baclofen 2.  Will start Lyrica 50mg  three times daily (samples provided) 3.  Follow up in 6 months  15 minutes spent face to face with patient and wife, over 50% spent discussing management.  Metta Clines, DO  CC:  [name], MD

## 2014-12-08 NOTE — Patient Instructions (Signed)
Try Lyrica 50mg  three times daily for pain. Follow up in 6 months.

## 2014-12-13 ENCOUNTER — Other Ambulatory Visit: Payer: Self-pay | Admitting: Internal Medicine

## 2014-12-13 DIAGNOSIS — E1159 Type 2 diabetes mellitus with other circulatory complications: Secondary | ICD-10-CM | POA: Diagnosis not present

## 2014-12-13 DIAGNOSIS — G8190 Hemiplegia, unspecified affecting unspecified side: Secondary | ICD-10-CM | POA: Diagnosis not present

## 2014-12-13 DIAGNOSIS — S88912D Complete traumatic amputation of left lower leg, level unspecified, subsequent encounter: Secondary | ICD-10-CM | POA: Diagnosis not present

## 2014-12-13 DIAGNOSIS — Z48815 Encounter for surgical aftercare following surgery on the digestive system: Secondary | ICD-10-CM | POA: Diagnosis not present

## 2014-12-13 DIAGNOSIS — S88911D Complete traumatic amputation of right lower leg, level unspecified, subsequent encounter: Secondary | ICD-10-CM | POA: Diagnosis not present

## 2014-12-13 DIAGNOSIS — G35 Multiple sclerosis: Secondary | ICD-10-CM | POA: Diagnosis not present

## 2014-12-21 LAB — HM DIABETES EYE EXAM

## 2014-12-26 ENCOUNTER — Telehealth: Payer: Self-pay | Admitting: Internal Medicine

## 2014-12-26 NOTE — Telephone Encounter (Signed)
i'll sign it, please bring the form

## 2014-12-26 NOTE — Telephone Encounter (Signed)
Have you seen this, not in Dr. Jeannetta Ellis of paperwork.

## 2014-12-26 NOTE — Telephone Encounter (Signed)
I do not have this form. I have contacted Anderson Malta with [facility] to please refax. JG//CMA

## 2014-12-26 NOTE — Telephone Encounter (Signed)
FYI. [facility] needs ASAP.

## 2014-12-26 NOTE — Telephone Encounter (Signed)
Caller name: Anderson Malta with [facility] Can be reached: [phone] Fax# [phone]  Attn: Anderson Malta  Reason for call: following up on orders for evaluation for occupational therapy. She refaxed the request 12/21/14. Please sign and return to her asap. Call her with questions.

## 2014-12-29 NOTE — Telephone Encounter (Signed)
Received order via fax from [facility]. Forwarded to Dr. Larose Kells for signature. JG//CMA

## 2014-12-30 NOTE — Telephone Encounter (Signed)
Orders faxed to [facility] successfully. JG//CMA

## 2015-01-02 ENCOUNTER — Telehealth: Payer: Self-pay | Admitting: Internal Medicine

## 2015-01-02 NOTE — Telephone Encounter (Signed)
Pre Visit letter sent  °

## 2015-01-12 ENCOUNTER — Other Ambulatory Visit: Payer: Self-pay | Admitting: Neurology

## 2015-01-12 NOTE — Telephone Encounter (Signed)
Rx sent 

## 2015-01-13 DIAGNOSIS — N390 Urinary tract infection, site not specified: Secondary | ICD-10-CM | POA: Diagnosis not present

## 2015-01-13 DIAGNOSIS — R32 Unspecified urinary incontinence: Secondary | ICD-10-CM | POA: Diagnosis not present

## 2015-01-17 ENCOUNTER — Ambulatory Visit: Payer: TRICARE For Life (TFL) | Admitting: Internal Medicine

## 2015-01-20 ENCOUNTER — Telehealth: Payer: Self-pay | Admitting: Behavioral Health

## 2015-01-20 NOTE — Telephone Encounter (Signed)
Unable to reach patient at time of Pre-Visit Call.  Left message for patient to return call when available.    

## 2015-01-24 ENCOUNTER — Ambulatory Visit (INDEPENDENT_AMBULATORY_CARE_PROVIDER_SITE_OTHER): Payer: Medicare Other | Admitting: Internal Medicine

## 2015-01-24 ENCOUNTER — Encounter: Payer: Self-pay | Admitting: Internal Medicine

## 2015-01-24 DIAGNOSIS — I10 Essential (primary) hypertension: Secondary | ICD-10-CM

## 2015-01-24 DIAGNOSIS — Z0000 Encounter for general adult medical examination without abnormal findings: Secondary | ICD-10-CM | POA: Diagnosis not present

## 2015-01-24 DIAGNOSIS — E785 Hyperlipidemia, unspecified: Secondary | ICD-10-CM

## 2015-01-24 LAB — HM DIABETES FOOT EXAM

## 2015-01-24 NOTE — Assessment & Plan Note (Addendum)
Td 2015 PNM shot 2008 PREVNAR--2015  rec Zostavax, Rx printed before   [place] 2011,  due for another colonoscopy,  already got a letter from GI and plans to talk to them No recent PSA in our system, he sees urology  Encouraged  a healthy diet

## 2015-01-24 NOTE — Progress Notes (Signed)
Subjective:    Patient ID: Jaime [name], male    DOB: 12-28-1940, 74 y.o.   MRN: [id number]  DOS:  01/24/2015 Type of visit - description :    Here for Medicare AWV:  1. Risk factors based on Past M, S, F history: reviewed 2. Physical Activities:  Unable   3. Depression/mood: neg screening   4. Hearing:   No problemss noted or reported   5. ADL's:  Limited by MS, has an aid for bathing, wife taj=kes care of him 6. Fall Risk: high risk, no recent falls   7. home Safety: does feel safe at home   8. Height, weight, & visual acuity: see VS, vision ok, just saw  the eye doctor   9. Counseling: provided 10. Labs ordered based on risk factors: if needed   11. Referral Coordination: if needed 12. Care Plan, see assessment and plan  , written plan provided  13. Cognitive Assessment: mild decrease memory per wife, on exam cognition is at baseline/normal 14. Care team updated 15. End of life care discussed   In addition, today we discussed the following: Hypertension, well controlled, ambulatory BPs around 120, 130 Phantom pain, on Lyrica, still having issues mostly on the right lower extremity stump. Diabetes, under the care of endocrinology, reports his last A1c was excellent Hyperlipidemia, good compliance with medication.   Review of Systems Constitutional: No fever. No chills. No unexplained wt changes. No unusual sweats  HEENT: No dental problems, no ear discharge, no facial swelling, no voice changes. No eye discharge, no eye  redness , no  intolerance to light   Respiratory: No wheezing , no  difficulty breathing. No cough , no mucus production  Cardiovascular: No CP, no leg swelling , no  Palpitations  GI: no nausea, no vomiting, no diarrhea , no  abdominal pain.  No blood in the stools. No dysphagia, no odynophagia    Endocrine: No polyphagia, no polyuria , no polydipsia  GU:  Has a permanent catheter, urine does not look lcloudy but apparently saw traces of blood  in theurine the last couple of days. he just saw urology few days ago and a urinalysis was clear. No fever chills.  Musculoskeletal:  No new concerns  Skin: No change in the color of the skin, palor , no  Rash  Allergic, immunologic: No environmental allergies , no  food allergies  Neurological: No dizziness no  syncope. No headaches. No diplopia, no slurred, no slurred speech, no motor deficits, no facial  Numbness  Hematological: No enlarged lymph nodes, no easy bruising , no unusual bleedings  Psychiatry: No suicidal ideas, no hallucinations, no beavior problems, no confusion.  No unusual/severe anxiety, no depression    Past Medical History  Diagnosis Date  . Hyperlipidemia   . Cardiomyopathy, ischemic     EF 45% per ECHO 2008  //   EF 25%, echo, August, 2013 //  Echo (8/15):  Mild LVH, EF 30-35%, ant-lat and lat AK, inf HK, Gr 1 DD, mild MR, mild LAE  . Pulmonary hypertension     moderate ECHO Jan 2008  . Systolic heart failure   . Multiple sclerosis [place] -- LAST VISIT 11-20-2010  NOTE W/ CHART  . Lung nodule     resolved 11-2006 CT Chest  . Tobacco abuse     quit   . Increased prostate specific antigen (PSA) velocity   . Urinary retention  dx ~ 2-12, like from [place], now with a catheter, saw urology  . Carotid artery disease     a.  Doppler, February, 2012, 0-39% bilateral,Mild smooth plaque;  b.  Carotid US (8/15):  Bilateral 1-39% ICA >>> F/u 2 years  . Chronic indwelling Foley catheter   . Scoliosis associated with other condition   . Fatigue SEVERE  . Impotence   . Hypertension   . H/O pleural effusion 2008    POST THORACENTESIS  . History of colon polyps PRECANCEROUS  . Insomnia   . CAD (coronary artery disease) CARDIOLOGIST- DR KATZ-- VISIT 06-05-2011 IN [facility]    non-STEMI, 2007.Marland Kitchenoccluded circumflex.. Taxus stent placed...residual 80% LAD...50% RCA  . Urinary tract infection     hx of  . Atherosclerotic PVD with ulceration      left foot  . PAC (premature atrial contraction)     December, 2013  . Diabetes mellitus     INSULIN-DEPENDent  . Dementia   . Constipation   . TIA (transient ischemic attack)   . Sleep apnea     Past Surgical History  Procedure Laterality Date  . Hernia repair  1990    (R)  . Thoracentesis  2008    PLEURAL EFFUSION  . Cystoscopy  07/30/2011    Procedure: CYSTOSCOPY;  Surgeon: Hanley Ben, MD;  Location: [facility];  Service: Urology;  Laterality: N/A;  . Transurethral resection of prostate  07/30/2011    Procedure: TRANSURETHRAL RESECTION OF THE PROSTATE (TURP);  Surgeon: Hanley Ben, MD;  Location: [facility];  Service: Urology;  Laterality: N/A;  . Femoral-tibial bypass graft  03/11/2012    Procedure: BYPASS GRAFT FEMORAL-TIBIAL ARTERY;  Surgeon: Conrad [place], MD;  Location: [place];  Service: Vascular;  Laterality: Left;  Left Femoral -Tibial trunk bypass, Endarterectomy of Tibial- Peroneal trunk with vein angioplasty.  . Intraoperative arteriogram  03/11/2012    Procedure: INTRA OPERATIVE ARTERIOGRAM;  Surgeon: Conrad Morrison, MD;  Location: [place];  Service: Vascular;  Laterality: Left;  . Femoral-popliteal bypass graft  03/11/2012    Procedure: BYPASS GRAFT FEMORAL-POPLITEAL ARTERY;  Surgeon: Conrad [place], MD;  Location: [place];  Service: Vascular;  Laterality: Left;  embolectomy left lower leg  . Amputation  03/17/2012    Procedure: AMPUTATION ABOVE KNEE;  Surgeon: Conrad [place], MD;  Location: [place];  Service: Vascular;  Laterality: Left;  . Coronary angioplasty with stent placement  05-01-2006    OCCLUDED CIRCUMFLEX -- TAXUS STENT PLACMENT  AND RESIDUAL 80% LAD,  50% RCA  . Amputation  06/03/2012    Procedure: AMPUTATION ABOVE KNEE;  Surgeon: Conrad Cambria, MD;  Location: [place];  Service: Vascular;  Laterality: Right;  . Lower extremity angiogram Bilateral 12/17/2011    Procedure: LOWER EXTREMITY ANGIOGRAM;  Surgeon: Serafina Mitchell, MD;  Location: [facility]  CATH LAB;  Service: Cardiovascular;  Laterality: Bilateral;  bil lower extrem angio  . Abdominal angiogram  12/17/2011    Procedure: ABDOMINAL ANGIOGRAM;  Surgeon: Serafina Mitchell, MD;  Location: [facility] CATH LAB;  Service: Cardiovascular;;  . Abdominal aortagram N/A 08/19/2013    Procedure: ABDOMINAL Maxcine Ham;  Surgeon: Conrad Waldo, MD;  [facility] CATH LAB;  Service: Cardiovascular;  Laterality: N/A;  . Ercp N/A 08/25/2014    Procedure: ENDOSCOPIC RETROGRADE CHOLANGIOPANCREATOGRAPHY (ERCP);  Surgeon: Ladene Artist, MD;  [facility] ENDOSCOPY;  Service: Endoscopy;  Laterality: N/A;  . 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 Above knee  leg amputation Right     05/2012  . Cholecystectomy  10/25/2014    DR [name]  . Cholecystectomy N/A 10/25/2014    Procedure: LAPAROSCOPIC CHOLECYSTECTOMY ;  Surgeon: Coralie Keens, MD;  Location: [facility] OR;  Service: General;  Laterality: N/A;   Family History  Problem Relation Age of Onset  . Heart attack Mother 44  . Heart disease Mother   . Stroke Mother   . Hyperlipidemia Mother   . Hypertension Mother   . Colon cancer Neg Hx   . Prostate cancer Neg Hx   . COPD Father   . Peripheral vascular disease Father   . Diabetes Brother   . Heart disease Brother   . Hypertension Brother   . Heart attack Brother   . Diabetes Daughter      History   Social History  . Marital Status: Married    Spouse Name: N/A  . Number of Children: 2  . Years of Education: N/A   Occupational History  . disable    Social History Main Topics  . Smoking status: Former Smoker -- 70 years    Quit date: 07/22/2012  . Smokeless tobacco: Current User     Comment: pt states that he is using E-cigs  . Alcohol Use: No  . Drug Use: No  . Sexual Activity: No     Comment: electronic cigarettes no nicotene   Other Topics Concern  . Not on file   Social History Narrative   Lives w/ wife        Medication List       This list is accurate as of: 01/24/15  5:39 PM.   Always use your most recent med list.               aspirin EC 325 MG tablet  Take 325 mg by mouth daily.     atorvastatin 40 MG tablet  Commonly known as:  LIPITOR  Take 1 tablet (40 mg total) by mouth daily.     baclofen 10 MG tablet  Commonly known as:  LIORESAL  TAKE 1/2 TABLET BY MOUTH TWICE A DAY     diclofenac sodium 1 % Gel  Commonly known as:  VOLTAREN  Apply 1 application topically 2 (two) times daily as needed (pain). For pain     ferrous sulfate 325 (65 FE) MG tablet  Take 325 mg by mouth daily with breakfast.     GLUCAGEN 1 MG Solr injection  Generic drug:  glucagon  Inject 1 mg into the vein once as needed for low blood sugar.     insulin aspart protamine- aspart (70-30) 100 UNIT/ML injection  Commonly known as:  NOVOLOG MIX 70/30  Inject 0.05 mLs (5 Units total) into the skin 2 (two) times daily with a meal.     lisinopril 10 MG tablet  Commonly known as:  PRINIVIL,ZESTRIL  TAKE 1 TABLET BY MOUTH EVERY DAY     metoprolol succinate 25 MG 24 hr tablet  Commonly known as:  TOPROL-XL  Take 1 tablet (25 mg total) by mouth daily.     multivitamin with minerals tablet  Take 1 tablet by mouth daily.     pantoprazole 40 MG tablet  Commonly known as:  PROTONIX  TAKE 1 TABLET BY MOUTH EVERY DAY AT 12 NOON     pregabalin 50 MG capsule  Commonly known as:  LYRICA  Take 1 capsule (50 mg total) by mouth 3 (three) times daily.  Objective:   Physical Exam BP 124/78 mmHg  Pulse 70  Temp(Src) 98.2 F (36.8 C) (Oral)  Ht   Wt   SpO2 97% General:   Well developed, well nourished . NAD.  HEENT:  Normocephalic . Face symmetric, atraumatic Lungs:  slt decreased breath sounds but otherwise clear Normal respiratory effort, no intercostal retractions, no accessory muscle use. Heart: RRR,  no murmur.  No pretibial edema bilaterally MSK: Bilateral AKA  Skin: Not pale. Not jaundice Neurologic:  alert & oriented X3. Speech normal Psych--    Cognition and judgment appear intact.  Cooperative with normal attention span and concentration.  Behavior appropriate. No anxious or depressed appearing.        Assessment & Plan:    Diabetes, per Dr. Debbora Presto; just had an eye check few days ago Hypertension, seems to be well-controlled Diabetes, per endocrinology phantom  pain, still has pains on and off, on Lyrica History of urinary retention, on a catheter, question of blood in the urine  recently,   recommend to watch for fever and chills

## 2015-01-24 NOTE — Patient Instructions (Signed)
Go to the lab    Please consider visit these websites for more information:  www.begintheconversation.org  theconversationproject.org    Fall Prevention and Home Safety Falls cause injuries and can affect all age groups. It is possible to use preventive measures to significantly decrease the likelihood of falls. There are many simple measures which can make your home safer and prevent falls. OUTDOORS  Repair cracks and edges of walkways and driveways.  Remove high doorway thresholds.  Trim shrubbery on the main path into your home.  Have good outside lighting.  Clear walkways of tools, rocks, debris, and clutter.  Check that handrails are not broken and are securely fastened. Both sides of steps should have handrails.  Have leaves, snow, and ice cleared regularly.  Use sand or salt on walkways during winter months.  In the garage, clean up grease or oil spills. BATHROOM  Install night lights.  Install grab bars by the toilet and in the tub and shower.  Use non-skid mats or decals in the tub or shower.  Place a plastic non-slip stool in the shower to sit on, if needed.  Keep floors dry and clean up all water on the floor immediately.  Remove soap buildup in the tub or shower on a regular basis.  Secure bath mats with non-slip, double-sided rug tape.  Remove throw rugs and tripping hazards from the floors. BEDROOMS  Install night lights.  Make sure a bedside light is easy to reach.  Do not use oversized bedding.  Keep a telephone by your bedside.  Have a firm chair with side arms to use for getting dressed.  Remove throw rugs and tripping hazards from the floor. KITCHEN  Keep handles on pots and pans turned toward the center of the stove. Use back burners when possible.  Clean up spills quickly and allow time for drying.  Avoid walking on wet floors.  Avoid hot utensils and knives.  Position shelves so they are not too high or low.  Place  commonly used objects within easy reach.  If necessary, use a sturdy step stool with a grab bar when reaching.  Keep electrical cables out of the way.  Do not use floor polish or wax that makes floors slippery. If you must use wax, use non-skid floor wax.  Remove throw rugs and tripping hazards from the floor. STAIRWAYS  Never leave objects on stairs.  Place handrails on both sides of stairways and use them. Fix any loose handrails. Make sure handrails on both sides of the stairways are as long as the stairs.  Check carpeting to make sure it is firmly attached along stairs. Make repairs to worn or loose carpet promptly.  Avoid placing throw rugs at the top or bottom of stairways, or properly secure the rug with carpet tape to prevent slippage. Get rid of throw rugs, if possible.  Have an electrician put in a light switch at the top and bottom of the stairs. OTHER FALL PREVENTION TIPS  Wear low-heel or rubber-soled shoes that are supportive and fit well. Wear closed toe shoes.  When using a stepladder, make sure it is fully opened and both spreaders are firmly locked. Do not climb a closed stepladder.  Add color or contrast paint or tape to grab bars and handrails in your home. Place contrasting color strips on first and last steps.  Learn and use mobility aids as needed. Install an electrical emergency response system.  Turn on lights to avoid dark areas. Replace light bulbs  that burn out immediately. Get light switches that glow.  Arrange furniture to create clear pathways. Keep furniture in the same place.  Firmly attach carpet with non-skid or double-sided tape.  Eliminate uneven floor surfaces.  Select a carpet pattern that does not visually hide the edge of steps.  Be aware of all pets. OTHER HOME SAFETY TIPS  Set the water temperature for 120 F (48.8 C).  Keep emergency numbers on or near the telephone.  Keep smoke detectors on every level of the home and near  sleeping areas. Document Released: 06/28/2002 Document Revised: 01/07/2012 Document Reviewed: 09/27/2011 [facility] Patient Information 2015 [place], [place]. This information is not intended to replace advice given to you by your health care provider. Make sure you discuss any questions you have with your health care provider.   Preventive Care for Adults Ages 61 and over  Blood pressure check.** / Every 1 to 2 years.  Lipid and cholesterol check.**/ Every 5 years beginning at age 63.  Lung cancer screening. / Every year if you are aged 90-80 years and have a 30-pack-year history of smoking and currently smoke or have quit within the past 15 years. Yearly screening is stopped once you have quit smoking for at least 15 years or develop a health problem that would prevent you from having lung cancer treatment.  Fecal occult blood test (FOBT) of stool. / Every year beginning at age 69 and continuing until age 37. You may not have to do this test if you get a colonoscopy every 10 years.  Flexible sigmoidoscopy** or colonoscopy.** / Every 5 years for a flexible sigmoidoscopy or every 10 years for a colonoscopy beginning at age 43 and continuing until age 45.  Hepatitis C blood test.** / For all people born from [birth year] through 1964 and any individual with known risks for hepatitis C.  Abdominal aortic aneurysm (AAA) screening.** / A one-time screening for ages 19 to 61 years who are current or former smokers.  Skin self-exam. / Monthly.  Influenza vaccine. / Every year.  Tetanus, diphtheria, and acellular pertussis (Tdap/Td) vaccine.** / 1 dose of Td every 10 years.  Varicella vaccine.** / Consult your health care provider.  Zoster vaccine.** / 1 dose for adults aged 83 years or older.  Pneumococcal 13-valent conjugate (PCV13) vaccine.** / Consult your health care provider.  Pneumococcal polysaccharide (PPSV23) vaccine.** / 1 dose for all adults aged 44 years and older.  Meningococcal  vaccine.** / Consult your health care provider.  Hepatitis A vaccine.** / Consult your health care provider.  Hepatitis B vaccine.** / Consult your health care provider.  Haemophilus influenzae type b (Hib) vaccine.** / Consult your health care provider. **Family history and personal history of risk and conditions may change your health care provider's recommendations. Document Released: 09/03/2001 Document Revised: 07/13/2013 Document Reviewed: 12/03/2010 [facility] Patient Information 2015 [place], [place]. This information is not intended to replace advice given to you by your health care provider. Make sure you discuss any questions you have with your health care provider.

## 2015-01-24 NOTE — Progress Notes (Signed)
Pre visit review using our clinic review tool, if applicable. No additional management support is needed unless otherwise documented below in the visit note. 

## 2015-01-25 LAB — CBC WITH DIFFERENTIAL/PLATELET
BASOS PCT: 0.1 % (ref 0.0–3.0)
Basophils Absolute: 0 10*3/uL (ref 0.0–0.1)
EOS PCT: 3.5 % (ref 0.0–5.0)
Eosinophils Absolute: 0.3 10*3/uL (ref 0.0–0.7)
HEMATOCRIT: 40.8 % (ref 39.0–52.0)
Hemoglobin: 13 g/dL (ref 13.0–17.0)
LYMPHS ABS: 2.1 10*3/uL (ref 0.7–4.0)
Lymphocytes Relative: 27.6 % (ref 12.0–46.0)
MCHC: 32 g/dL (ref 30.0–36.0)
MCV: 79.3 fl (ref 78.0–100.0)
MONO ABS: 0.5 10*3/uL (ref 0.1–1.0)
Monocytes Relative: 6.1 % (ref 3.0–12.0)
NEUTROS ABS: 4.7 10*3/uL (ref 1.4–7.7)
Neutrophils Relative %: 62.7 % (ref 43.0–77.0)
Platelets: 191 10*3/uL (ref 150.0–400.0)
RBC: 5.14 Mil/uL (ref 4.22–5.81)
RDW: 19.5 % — ABNORMAL HIGH (ref 11.5–15.5)
WBC: 7.5 10*3/uL (ref 4.0–10.5)

## 2015-01-25 LAB — LIPID PANEL
Cholesterol: 104 mg/dL (ref 0–200)
HDL: 31.1 mg/dL — AB (ref >39.00)
LDL Cholesterol: 58 mg/dL (ref 0–99)
NonHDL: 72.9
TRIGLYCERIDES: 75 mg/dL (ref 0.0–149.0)
Total CHOL/HDL Ratio: 3
VLDL: 15 mg/dL (ref 0.0–40.0)

## 2015-02-01 ENCOUNTER — Ambulatory Visit: Payer: TRICARE For Life (TFL) | Admitting: Internal Medicine

## 2015-02-02 ENCOUNTER — Telehealth: Payer: Self-pay | Admitting: Internal Medicine

## 2015-02-02 DIAGNOSIS — Z960 Presence of urogenital implants: Secondary | ICD-10-CM

## 2015-02-02 NOTE — Telephone Encounter (Signed)
Will be on look out for paperwork.

## 2015-02-02 NOTE — Telephone Encounter (Signed)
LouAnn called & next call from pts wife.  Catheter supply orders are coming over from [facility]  Via fax. Provided fax # [phone].  They will need an order and office visit notes. Pt is really needing these asap per his wife. Please get forms sent back quickly.

## 2015-02-06 NOTE — Telephone Encounter (Signed)
Still have not received forms, please make sure they are faxing to correct fax number. Thank you.

## 2015-02-15 NOTE — Telephone Encounter (Signed)
Pt wife called and stated that [facility] is not able to ship to [place] anymore and asking that RXs for condom cats, bed bags, and sray that were sent to [facility] be sent to [place] locally. Please call her if issues, [name] [phone].

## 2015-02-15 NOTE — Telephone Encounter (Signed)
Left msg for pt wife to call back with # of condom caths per day, # of bed bags used per day, and type of spray that is needed. She said we previously sent the order to [facility] so I assumed all of this was on file.

## 2015-02-15 NOTE — Telephone Encounter (Signed)
Will begin paperwork, however, Dr. Larose Kells will not be in office until Monday, February 20, 2015 to sign paperwork. Please inform Pt's wife, and ask if Pt has enough supplies to last until next week, and please have Pt's wife clarify what an "sray" is and how often condom catheters and bed bags are changed.  Thank you.

## 2015-02-22 NOTE — Telephone Encounter (Signed)
Caller name: Mrs. Hane Relation to [id number]  Call back number: [phone]   Reason for call:  Box of 30, size 29 millimeter, 1 bed bag a week (4 total for the month), cavilon spray and a leg bag. Would like to order from [facility]. Mrs. Proch stated he would like to speak with [facility] directly.

## 2015-02-22 NOTE — Addendum Note (Signed)
Addended by: Wilfrid Lund on: 02/22/2015 11:44 AM   Modules accepted: Orders

## 2015-02-22 NOTE — Telephone Encounter (Signed)
Referral placed to home health ([facility]) for them to come out and assess catheter supplies since [facility] is no longer supplying to [facility] per Pt's wife.

## 2015-03-03 ENCOUNTER — Other Ambulatory Visit: Payer: Self-pay | Admitting: Neurology

## 2015-03-15 ENCOUNTER — Telehealth: Payer: Self-pay | Admitting: Neurology

## 2015-03-15 NOTE — Telephone Encounter (Signed)
Pt's wife [name] called and would like to have a prescription for Orland Penman called in, she said Dr [name] gave him some samples and he likes it, he has been taking 2 a day totaling 100mg  and would like to continue/Dawn CB# [phone]

## 2015-03-15 NOTE — Telephone Encounter (Signed)
Please advise on           Pt's wife Jaime [name] called and would like to have a prescription for Lyraca called in, she said Dr [name] gave him some samples and he likes it, he has been taking 2 a day totaling 100mg  and would like to continue

## 2015-03-16 ENCOUNTER — Other Ambulatory Visit: Payer: Self-pay | Admitting: Neurology

## 2015-03-16 MED ORDER — PREGABALIN 50 MG PO CAPS: 50.0000 mg | ORAL_CAPSULE | Freq: Two times a day (BID) | ORAL | Status: DC

## 2015-03-16 NOTE — Telephone Encounter (Signed)
Rx faxed to patient's pharmacy. Patient's wife was notified of this.

## 2015-03-16 NOTE — Addendum Note (Signed)
Addended by: Thurmon Fair on: 03/16/2015 08:44 AM   Modules accepted: Orders

## 2015-03-16 NOTE — Telephone Encounter (Signed)
Please fax a prescription for Lyrica 50mg  twice daily with 5 refills to his pharmacy.  Thanks.

## 2015-03-29 ENCOUNTER — Telehealth: Payer: Self-pay | Admitting: Neurology

## 2015-03-29 DIAGNOSIS — G35 Multiple sclerosis: Secondary | ICD-10-CM

## 2015-03-29 NOTE — Telephone Encounter (Signed)
Pt's wife [name] called in regards to him having a lot of pain and the Orland Penman is not working and wanted to know if there was something else he could take/Dawn CB# [phone]

## 2015-03-30 NOTE — Telephone Encounter (Signed)
He was taking 50mg  twice daily.  We can increase to 150mg  twice daily.

## 2015-03-30 NOTE — Telephone Encounter (Signed)
Tried to call patient but states that number is not excepting calls at this time. Will try again later. [facility]

## 2015-03-31 MED ORDER — PREGABALIN 150 MG PO CAPS: 150.0000 mg | ORAL_CAPSULE | Freq: Two times a day (BID) | ORAL | Status: DC

## 2015-03-31 NOTE — Telephone Encounter (Signed)
Spoke with patients wife [name] and I have called in the new Rx of Lyrica to the pharmacy. Patient has been made aware and voiced understanding. Thanks

## 2015-04-03 DIAGNOSIS — N390 Urinary tract infection, site not specified: Secondary | ICD-10-CM | POA: Diagnosis not present

## 2015-04-03 DIAGNOSIS — R350 Frequency of micturition: Secondary | ICD-10-CM | POA: Diagnosis not present

## 2015-04-11 ENCOUNTER — Encounter: Payer: Self-pay | Admitting: Gastroenterology

## 2015-04-15 ENCOUNTER — Other Ambulatory Visit: Payer: Self-pay | Admitting: Internal Medicine

## 2015-04-21 ENCOUNTER — Telehealth: Payer: Self-pay | Admitting: Gastroenterology

## 2015-04-22 ENCOUNTER — Other Ambulatory Visit: Payer: Self-pay | Admitting: Internal Medicine

## 2015-04-23 ENCOUNTER — Other Ambulatory Visit: Payer: Self-pay | Admitting: Internal Medicine

## 2015-04-23 ENCOUNTER — Other Ambulatory Visit: Payer: Self-pay | Admitting: Neurology

## 2015-04-25 NOTE — Telephone Encounter (Signed)
Left message on machine to call back  

## 2015-04-27 NOTE — Telephone Encounter (Signed)
Left message on machine to call back  

## 2015-05-04 NOTE — Telephone Encounter (Signed)
Unable to reach pt letter mailed 

## 2015-05-26 ENCOUNTER — Telehealth: Payer: Self-pay | Admitting: Gastroenterology

## 2015-05-26 NOTE — Telephone Encounter (Signed)
Spoke to patient's wife and appointment made for pre-visit on 06-06-15 at 1:00pm for recall colon.

## 2015-05-26 NOTE — Telephone Encounter (Deleted)
Spoke to patients wife and scheduled appointment.

## 2015-05-31 ENCOUNTER — Other Ambulatory Visit: Payer: Self-pay | Admitting: Internal Medicine

## 2015-06-01 NOTE — Telephone Encounter (Signed)
Rx sent 

## 2015-06-01 NOTE — Telephone Encounter (Signed)
Okay 1, no refills. Office visit if severe symptoms

## 2015-06-01 NOTE — Patient Outreach (Signed)
[facility]) Care Management  06/01/2015  Jaime [name] [birth year] [id number]   Referral from [facility], assigned Enzo Montgomery, RN to outreach for [facility] Management services.  Thanks, Ronnell Freshwater. [place], [place] Assistant Phone: [phone] Fax: [phone]

## 2015-06-01 NOTE — Telephone Encounter (Signed)
Pt is requesting refill on Mupirocin 2 % ointment. Not longer on medication list.  Last OV: 01/24/2015, next appt scheduled 07/28/2015 at 1:30 Last Fill: 05/25/2014 #30g and 2RF   Please advise.

## 2015-06-02 ENCOUNTER — Encounter (HOSPITAL_COMMUNITY): Payer: Self-pay

## 2015-06-02 ENCOUNTER — Telehealth: Payer: Self-pay

## 2015-06-02 ENCOUNTER — Emergency Department (INDEPENDENT_AMBULATORY_CARE_PROVIDER_SITE_OTHER)
Admission: EM | Admit: 2015-06-02 | Discharge: 2015-06-02 | Disposition: A | Discharge status: Home / Self Care | Payer: Medicare Other | Source: Home / Self Care

## 2015-06-02 DIAGNOSIS — L853 Xerosis cutis: Secondary | ICD-10-CM

## 2015-06-02 DIAGNOSIS — Z87448 Personal history of other diseases of urinary system: Secondary | ICD-10-CM

## 2015-06-02 DIAGNOSIS — Z87898 Personal history of other specified conditions: Secondary | ICD-10-CM

## 2015-06-02 DIAGNOSIS — L089 Local infection of the skin and subcutaneous tissue, unspecified: Secondary | ICD-10-CM

## 2015-06-02 DIAGNOSIS — L850 Acquired ichthyosis: Secondary | ICD-10-CM

## 2015-06-02 LAB — POCT URINALYSIS DIP (DEVICE)
Bilirubin Urine: NEGATIVE
GLUCOSE, UA: NEGATIVE mg/dL
Hgb urine dipstick: NEGATIVE
Ketones, ur: NEGATIVE mg/dL
LEUKOCYTES UA: NEGATIVE
NITRITE: NEGATIVE
Protein, ur: NEGATIVE mg/dL
Specific Gravity, Urine: 1.015 (ref 1.005–1.030)
UROBILINOGEN UA: 0.2 mg/dL (ref 0.0–1.0)
pH: 5.5 (ref 5.0–8.0)

## 2015-06-02 MED ORDER — CLINDAMYCIN HCL 150 MG PO CAPS: 150.0000 mg | ORAL_CAPSULE | Freq: Four times a day (QID) | ORAL | Status: DC

## 2015-06-02 NOTE — ED Notes (Signed)
Concern for another UTI. Concern for skin infection.

## 2015-06-02 NOTE — Discharge Instructions (Signed)
Cellulitis °Cellulitis is an infection of the skin and the tissue under the skin. The infected area is usually red and tender. This happens most often in the arms and lower legs. °HOME CARE  °· Take your antibiotic medicine as told. Finish the medicine even if you start to feel better. °· Keep the infected arm or leg raised (elevated). °· Put a warm cloth on the area up to 4 times per day. °· Only take medicines as told by your doctor. °· Keep all doctor visits as told. °GET HELP IF: °· You see red streaks on the skin coming from the infected area. °· Your red area gets bigger or turns a dark color. °· Your bone or joint under the infected area is painful after the skin heals. °· Your infection comes back in the same area or different area. °· You have a puffy (swollen) bump in the infected area. °· You have new symptoms. °· You have a fever. °GET HELP RIGHT AWAY IF:  °· You feel very sleepy. °· You throw up (vomit) or have watery poop (diarrhea). °· You feel sick and have muscle aches and pains. °  °This information is not intended to replace advice given to you by your health care provider. Make sure you discuss any questions you have with your health care provider. °  °Document Released: 12/25/2007 Document Revised: 03/29/2015 Document Reviewed: 09/23/2011 °Elsevier Interactive Patient Education ©2016 Elsevier Inc. ° °

## 2015-06-02 NOTE — Telephone Encounter (Signed)
Now pt's wife is calling saying she thinks he has MRSA on "his skin because it looks like it did before when he had it" and she is taking him to primary care.  She plans to call us back with the outcome of that visit to possibly reschedule.  Thank you, Melek Pownall/PV

## 2015-06-02 NOTE — ED Provider Notes (Signed)
CSN: [id number]     Arrival date & time 06/02/15  1406 History   None    Chief Complaint  Patient presents with  . Recurrent UTI  . Skin Problem   (Consider location/radiation/quality/duration/timing/severity/associated sxs/prior Treatment) HPI History obtained from patient:   LOCATION:back SEVERITY: No pain DURATION: One week CONTEXT: Sudden onset QUALITY: Drying, itching MODIFYING FACTORS: Bactroban (from PCP) ASSOCIATED SYMPTOMS: Dysuria TIMING: Skin issue is constant OCCUPATION: Disabled  Past Medical History  Diagnosis Date  . Hyperlipidemia   . Cardiomyopathy, ischemic     EF 45% per ECHO 2008  //   EF 25%, echo, August, 2013 //  Echo (8/15):  Mild LVH, EF 30-35%, ant-lat and lat AK, inf HK, Gr 1 DD, mild MR, mild LAE  . Pulmonary hypertension ([place])     moderate ECHO Jan 2008  . Systolic heart failure   . Multiple sclerosis ([place]) [place] -- LAST VISIT 11-20-2010  NOTE W/ CHART  . Lung nodule     resolved 11-2006 CT Chest  . Tobacco abuse     quit   . Increased prostate specific antigen (PSA) velocity   . Urinary retention     dx ~ 2-12, like from [place], now with a catheter, saw urology  . Carotid artery disease ([place])     a.  Doppler, February, 2012, 0-39% bilateral,Mild smooth plaque;  b.  Carotid US (8/15):  Bilateral 1-39% ICA >>> F/u 2 years  . Chronic indwelling Foley catheter   . Scoliosis associated with other condition   . Fatigue SEVERE  . Impotence   . Hypertension   . H/O pleural effusion 2008    POST THORACENTESIS  . History of colon polyps PRECANCEROUS  . Insomnia   . CAD (coronary artery disease) CARDIOLOGIST- DR KATZ-- VISIT 06-05-2011 IN EPIC    non-STEMI, 2007.Marland Kitchenoccluded circumflex.. Taxus stent placed...residual 80% LAD...50% RCA  . Urinary tract infection     hx of  . Atherosclerotic PVD with ulceration ([place])     left foot  . PAC (premature atrial contraction)     December, 2013  . Diabetes mellitus    INSULIN-DEPENDent  . Dementia   . Constipation   . TIA (transient ischemic attack)   . Sleep apnea    Past Surgical History  Procedure Laterality Date  . Hernia repair  1990    (R)  . Thoracentesis  2008    PLEURAL EFFUSION  . Cystoscopy  07/30/2011    Procedure: CYSTOSCOPY;  Surgeon: Hanley Ben, MD;  Location: [facility];  Service: Urology;  Laterality: N/A;  . Transurethral resection of prostate  07/30/2011    Procedure: TRANSURETHRAL RESECTION OF THE PROSTATE (TURP);  Surgeon: Hanley Ben, MD;  Location[facility];  Service: Urology;  Laterality: N/A;  . Femoral-tibial bypass graft  03/11/2012    Procedure: BYPASS GRAFT FEMORAL-TIBIAL ARTERY;  Surgeon: Conrad Folcroft, MD;  Location: [place];  Service: Vascular;  Laterality: Left;  Left Femoral -Tibial trunk bypass, Endarterectomy of Tibial- Peroneal trunk with vein angioplasty.  . Intraoperative arteriogram  03/11/2012    Procedure: INTRA OPERATIVE ARTERIOGRAM;  Surgeon: Conrad Point Blank, MD;  Location: [place];  Service: Vascular;  Laterality: Left;  . Femoral-popliteal bypass graft  03/11/2012    Procedure: BYPASS GRAFT FEMORAL-POPLITEAL ARTERY;  Surgeon: Conrad House, MD;  Location: [place];  Service: Vascular;  Laterality: Left;  embolectomy left lower leg  . Amputation  03/17/2012  Procedure: AMPUTATION ABOVE KNEE;  Surgeon: Conrad Belmont, MD;  Location: [place];  Service: Vascular;  Laterality: Left;  . Coronary angioplasty with stent placement  05-01-2006    OCCLUDED CIRCUMFLEX -- TAXUS STENT PLACMENT  AND RESIDUAL 80% LAD,  50% RCA  . Amputation  06/03/2012    Procedure: AMPUTATION ABOVE KNEE;  Surgeon: Conrad Mount Carmel, MD;  Location: [place];  Service: Vascular;  Laterality: Right;  . Lower extremity angiogram Bilateral 12/17/2011    Procedure: LOWER EXTREMITY ANGIOGRAM;  Surgeon: Serafina Mitchell, MD;  Location: [facility] CATH LAB;  Service: Cardiovascular;  Laterality: Bilateral;  bil lower extrem angio  . Abdominal  angiogram  12/17/2011    Procedure: ABDOMINAL ANGIOGRAM;  Surgeon: Serafina Mitchell, MD;  Location: [facility] CATH LAB;  Service: Cardiovascular;;  . Abdominal aortagram N/A 08/19/2013    Procedure: ABDOMINAL Maxcine Ham;  Surgeon: Conrad Belton, MD;  Location[facility] CATH LAB;  Service: Cardiovascular;  Laterality: N/A;  . Ercp N/A 08/25/2014    Procedure: ENDOSCOPIC RETROGRADE CHOLANGIOPANCREATOGRAPHY (ERCP);  Surgeon: Ladene Artist, MD;  Location: [facility] ENDOSCOPY;  Service: Endoscopy;  Laterality: N/A;  . 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 Above knee leg amputation Right     05/2012  . Cholecystectomy  10/25/2014    DR [name]  . Cholecystectomy N/A 10/25/2014    Procedure: LAPAROSCOPIC CHOLECYSTECTOMY ;  Surgeon: Coralie Keens, MD;  [facility];  Service: General;  Laterality: N/A;   Family History  Problem Relation Age of Onset  . Heart attack Mother 42  . Heart disease Mother   . Stroke Mother   . Hyperlipidemia Mother   . Hypertension Mother   . Colon cancer Neg Hx   . Prostate cancer Neg Hx   . COPD Father   . Peripheral vascular disease Father   . Diabetes Brother   . Heart disease Brother   . Hypertension Brother   . Heart attack Brother   . Diabetes Daughter    Social History  Substance Use Topics  . Smoking status: Former Smoker -- 17 years    Quit date: 07/22/2012  . Smokeless tobacco: Current User     Comment: pt states that he is using E-cigs  . Alcohol Use: No    Review of Systems ROS +'ve rash, dysuria  Denies: HEADACHE, NAUSEA, ABDOMINAL PAIN, CHEST PAIN, CONGESTION, SHORTNESS OF BREATH  Allergies  Bee venom and Influenza vaccines  Home Medications   Prior to Admission medications   Medication Sig Start Date End Date Taking? Authorizing Provider  aspirin EC 325 MG tablet Take 325 mg by mouth daily.    Historical Provider, MD  atorvastatin (LIPITOR) 40 MG tablet Take 1 tablet (40 mg total) by mouth daily. 04/24/15   Colon [place], MD  baclofen (LIORESAL) 10  MG tablet TAKE 1/2 TABLET BY MOUTH TWICE A DAY 04/24/15   Pieter Partridge, DO  clindamycin (CLEOCIN) 150 MG capsule Take 1 capsule (150 mg total) by mouth every 6 (six) hours. 06/02/15   Konrad Felix, PA  diclofenac sodium (VOLTAREN) 1 % GEL Apply 1 application topically 2 (two) times daily as needed (pain). For pain 03/09/12   Charlett Blake, MD  ferrous sulfate 325 (65 FE) MG tablet Take 325 mg by mouth daily with breakfast.    Historical Provider, MD  glucagon (GLUCAGEN) 1 MG SOLR injection Inject 1 mg into the vein once as needed for low blood sugar.    Historical Provider, MD  insulin aspart protamine- aspart (NOVOLOG MIX 70/30) (70-30) 100 UNIT/ML injection Inject 0.05 mLs (5 Units total) into the skin 2 (two) times daily with a meal. 11/03/14   Belkys A Regalado, MD  lisinopril (PRINIVIL,ZESTRIL) 10 MG tablet Take 1 tablet (10 mg total) by mouth daily. 04/17/15   Colon Branch, MD  metoprolol succinate (TOPROL-XL) 25 MG 24 hr tablet Take 1 tablet (25 mg total) by mouth daily. 04/24/15   Colon Branch, MD  Multiple Vitamins-Minerals (MULTIVITAMIN WITH MINERALS) tablet Take 1 tablet by mouth daily.    Historical Provider, MD  mupirocin ointment (BACTROBAN) 2 % Apply topically 2 (two) times daily as needed. 06/01/15   Colon Branch, MD  pantoprazole (PROTONIX) 40 MG tablet TAKE 1 TABLET BY MOUTH EVERY DAY AT 12 NOON 07/25/14   Colon Branch, MD  pregabalin (LYRICA) 150 MG capsule Take 1 capsule (150 mg total) by mouth 2 (two) times daily. 03/31/15   Pieter Partridge, DO   Meds Ordered and Administered this Visit  Medications - No data to display  BP 137/88 mmHg  Pulse 74  Temp(Src) 98.1 F (36.7 C) (Oral)  Resp 16  SpO2 100% No data found.   Physical Exam  Constitutional: He appears well-developed and well-nourished. No distress.  Skin: Skin is warm and dry. Rash noted. Rash is maculopapular.       ED Course  Procedures (including critical care time)  Labs Review Labs Reviewed  POCT URINALYSIS  DIP (DEVICE)    Imaging Review No results found.   Visual Acuity Review  Right Eye Distance:   Left Eye Distance:   Bilateral Distance:    Right Eye Near:   Left Eye Near:    Bilateral Near:         MDM   1. Soft tissue infection   2. Dry skin dermatitis   3. 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 Patient also has MS. He uses a condom catheter and had a bit of dysuria by his report 2 weeks ago but that is better now. His wife states that his urine was a bit dark at that time but since she is increase his fluid intake that has gotten better also the urine is now revealed malodorous. Patient did bring a urine sample with him which was tested by dipstick and is negative. Because of the redness and a maculopapular lesion on his back will start him on clindamycin he has an appointment for follow-up with his primary care provider next week patient and his wife are advised to keep this appointment. I also advised to return if there are new or worsening of symptoms.    Konrad Felix, PA 06/02/15 1512  Konrad Felix, PA 06/02/15 [place], [place] 06/02/15 [phone]

## 2015-06-02 NOTE — Telephone Encounter (Signed)
Pt has 2 [facility] procedure appts by you in epic.  When I spoke to his wife she said he has to be admitted to [facility] the day before his procedure because he is a bilat amputee that can not transfer onto stretcher and she is too sick herself to assist him with prep.  She said she spoke to Sanda Linger about this.  Precious Bard is not here today and neither are you so I'm sending you this message so you two can get together (or another nurse making DJ's hospital appts) and decide what to do.  Thank you for assisting with this, Angela/PV

## 2015-06-05 ENCOUNTER — Other Ambulatory Visit: Payer: Self-pay

## 2015-06-05 NOTE — Telephone Encounter (Signed)
Given his signficant acute and chronic health problems he needs rov before any procedures should be scheduled.  The colonoscopy fin 2011 yielded no polyps on pathology and the one in 2007 removed a single small adenomatous polyp.  By current guidelines he MAY not really require any surveillance currently.  Need to discuss this in the office.

## 2015-06-05 NOTE — Patient Outreach (Signed)
[facility]) Care Management  06/05/2015  KAMAR STEINBACHER 1940-12-17 [id number]   Telephone Screen  Referral Date: 06/01/15 Referral Source: NextGen Tier 4 List Referral Reason:CHF, 2 admits, 1 ED visit PCP: Dr. Marlana Salvage attempt # 1 to patient. Patient not reached. RN CM left HIPAA compliant voicemail message.  Plan: RN CM will attempt outreach call to patient within a week.  Enzo Montgomery, RN,BSN,CCM [place] Management Telephonic Care Management Coordinator Direct Phone: [phone] Toll Free: [phone] Fax: [phone]

## 2015-06-05 NOTE — Telephone Encounter (Signed)
Dr. Ardis Hughs, please advise. Would you like [place] to schedule patient at [facility] or do you want him to have an office visit to discuss recall colon?  Thank you

## 2015-06-06 NOTE — Telephone Encounter (Signed)
Spoke to patients wife and advised her of  Dr. Ardis Hughs recommendations. Office visit appointment made for 08-07-15 to discuss colonoscopy.

## 2015-06-08 ENCOUNTER — Ambulatory Visit: Payer: Medicare Other

## 2015-06-09 ENCOUNTER — Other Ambulatory Visit: Payer: Self-pay

## 2015-06-09 NOTE — Patient Outreach (Signed)
[name] Care Management  06/09/2015  Jaime [name] 1941/01/08 [id number]  Telephone Screen  Referral Date: 06/01/15 Referral Source: NextGen Tier 4 List Referral Reason:CHF, 2 admits, 1 ED visit PCP: Dr. Marlana Salvage attempt #2 to patient. No answer-unable to leave message.  Plan: RN CM will make outreach attempt call to patient within a week.   Enzo Montgomery, RN,BSN,CCM [place] Management Telephonic Care Management Coordinator Direct Phone: [phone] Toll Free: [phone] Fax: [phone]

## 2015-06-13 ENCOUNTER — Ambulatory Visit: Payer: Medicare Other

## 2015-06-13 ENCOUNTER — Other Ambulatory Visit: Payer: Self-pay

## 2015-06-13 DIAGNOSIS — E119 Type 2 diabetes mellitus without complications: Secondary | ICD-10-CM

## 2015-06-13 NOTE — Patient Outreach (Addendum)
[name]) Care Management  06/13/2015  ADRIC REVIER 03-22-1941 [id number]   Telephone Screen  Referral Date: 06/01/15 Referral Source: NextGen Tier 4 List Referral Reason:CHF, 2 admits, 1 ED visit PCP: Dr. Marlana Salvage attempt #3 to patient. Spoke with patient who gave verbal consent to speak with spouse.  Social: patient resides in his home with his spouse as main caregiver. His dtr assists with his care as well. Patient is dependent with ADLs/IADLs. He is wheelchair bound as he has severe scoliosis, multiple sclerosis and bilateral amputee. Spouse reports no recent falls. DME in the home include wheelchair and hoyer lift. Spouse or dtr transports patient to MD appt in their handicap accessible van. Spouse states she recently had surgery herself and is limited in being able to care for him at this time.  Conditions: Patient suffers from HTN, DM and CHF as well. Spouse monitoring blood sugars in the home. He has no open wounds. He does have issues with bladder incontinence and wears a condom cath daily. Spouse reports that he may be getting permanent cath placement in the future.  Medications: Per spouse he is taking about 10 meds. No issues with affording meds at this time.  Appointments: Patient has not seen PCP in a few months. Spouse plans to make an appt in the near future. Patient was in the ED on 06/02/15 for UTI. Spouse states that patient had severe reaction to flu vaccine several years ago and has been advised by MD not to get vaccine anymore.   Consent: Patient/spouse gave verbal consent for [facility] services.  Plan: RN CM will notify [facility] administrative assistant that patient agreed to services and case opened. RN CM will send [facility] pharmacy referral for polypharmacy med review. RN CM will send [facility] community referral for further in home eval and assessment of care needs. RN CM provided spouse with [facility] contact info.  Enzo Montgomery, RN,BSN,CCM [place]  Management Telephonic Care Management Coordinator Direct Phone: [phone] Toll Free: [phone] Fax: [phone]

## 2015-06-14 ENCOUNTER — Other Ambulatory Visit: Payer: Self-pay | Admitting: Neurology

## 2015-06-14 NOTE — Telephone Encounter (Signed)
Rx sent 

## 2015-06-19 ENCOUNTER — Other Ambulatory Visit: Payer: Self-pay | Admitting: Neurology

## 2015-06-20 NOTE — Telephone Encounter (Signed)
Last OV: 12/08/14 Next OV: 06/21/15

## 2015-06-21 ENCOUNTER — Other Ambulatory Visit: Payer: Self-pay

## 2015-06-21 ENCOUNTER — Ambulatory Visit (INDEPENDENT_AMBULATORY_CARE_PROVIDER_SITE_OTHER): Payer: Medicare Other | Admitting: Neurology

## 2015-06-21 ENCOUNTER — Encounter: Payer: Self-pay | Admitting: Neurology

## 2015-06-21 VITALS — BP 134/76 | HR 62

## 2015-06-21 DIAGNOSIS — G8220 Paraplegia, unspecified: Secondary | ICD-10-CM | POA: Diagnosis not present

## 2015-06-21 DIAGNOSIS — G35 Multiple sclerosis: Secondary | ICD-10-CM | POA: Diagnosis not present

## 2015-06-21 DIAGNOSIS — G546 Phantom limb syndrome with pain: Secondary | ICD-10-CM | POA: Diagnosis not present

## 2015-06-21 DIAGNOSIS — E559 Vitamin D deficiency, unspecified: Secondary | ICD-10-CM

## 2015-06-21 DIAGNOSIS — E569 Vitamin deficiency, unspecified: Secondary | ICD-10-CM

## 2015-06-21 DIAGNOSIS — Z79899 Other long term (current) drug therapy: Secondary | ICD-10-CM

## 2015-06-21 DIAGNOSIS — I2510 Atherosclerotic heart disease of native coronary artery without angina pectoris: Secondary | ICD-10-CM

## 2015-06-21 DIAGNOSIS — Z139 Encounter for screening, unspecified: Secondary | ICD-10-CM

## 2015-06-21 DIAGNOSIS — M533 Sacrococcygeal disorders, not elsewhere classified: Secondary | ICD-10-CM | POA: Diagnosis not present

## 2015-06-21 NOTE — Progress Notes (Signed)
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  He is accompanied by his wife who provides some history.  Records reviewed.  UPDATE: For pain, he takes Lyrica 150mg  twice daily.  He also takes baclofen 5mg  twice daily.  Phantom limb pain is well-controlled.  However, he reports lower back and sacral pain.  It is uncomfortable to sit for any extended period of time and he will often lay in bed.  HISTORY: He was diagnosed with MS in 1993.  Initially, he developed right arm and leg weakness along with balance problems.  Symptoms initially improved but had progressed over the years.  He was on multiple immunomodulating agents such as Betaseron, Copaxone, Cytoxin and monthly Solumedrol.  He currently is not on an agent and hasn't had a flare up in 4 years.  He has bilateral ATK ambutations performed in 2013 as a consequence of his peripheral vascular and diabetes.  He requires assistance with bathing and dressing, but he is able to feed himself.  He uses a power wheelchair.  He had been on amantadine for many years.  MRI Brain w/wo from 11/29/04 demonstrated "extensive white matter disease in the supratentorial compartment, which is consistent with advanced multiple sclerosis.  There is confluence of the white matter lesions with atrophy and hydrocephalus, thinned corpus callosum, and black holes, however somewhat unusual for MS of this degree, no infrantentorial lesions are seen and no active lesions are identified by means of contrast." CT of head was performed on 03/23/12 for encephalopathy, which revealed atrophy and nonspecific small vessel ischemic changes.  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  He has some poor short-term  memory. Sometimes he calls his wife by his mother's name.  He cannot always remember the names of his great grandchildren.  He has more difficulty figuring out how to maneuver the motorized wheelchair.  Prior medication:  Gabapentin was ineffective.  Amantadine was discontinued due to nightmares.  He has [place].  He is unable to sit due to pain and often is laying in bed.  He is frequently turned to avoid pressure ulcers.  PAST MEDICAL HISTORY: Past Medical History  Diagnosis Date  . Hyperlipidemia   . Cardiomyopathy, ischemic     EF 45% per ECHO 2008  //   EF 25%, echo, August, 2013 //  Echo (8/15):  Mild LVH, EF 30-35%, ant-lat and lat AK, inf HK, Gr 1 DD, mild MR, mild LAE  . Pulmonary hypertension ([place])     moderate ECHO Jan 2008  . Systolic heart failure   . Multiple sclerosis ([place]) [place] -- LAST VISIT 11-20-2010  NOTE W/ CHART  . Lung nodule     resolved 11-2006 CT Chest  . Tobacco abuse     quit   . Increased prostate specific antigen (PSA) velocity   . Urinary retention     dx ~ 2-12, like from [place], now with a catheter, saw urology  . Carotid artery disease ([place])     a.  Doppler, February, 2012, 0-39% bilateral,Mild smooth plaque;  b.  Carotid US (8/15):  Bilateral 1-39% ICA >>> F/u 2 years  . Chronic indwelling Foley catheter   . Scoliosis associated with other  condition   . Fatigue SEVERE  . Impotence   . Hypertension   . H/O pleural effusion 2008    POST THORACENTESIS  . History of colon polyps PRECANCEROUS  . Insomnia   . CAD (coronary artery disease) CARDIOLOGIST- DR KATZ-- VISIT 06-05-2011 IN [facility]    non-STEMI, 2007.Marland Kitchenoccluded circumflex.. Taxus stent placed...residual 80% LAD...50% RCA  . Urinary tract infection     hx of  . Atherosclerotic PVD with ulceration ([place])     left foot  . PAC (premature atrial contraction)     December, 2013  . Diabetes mellitus     INSULIN-DEPENDent  . Dementia   . Constipation   . TIA  (transient ischemic attack)   . Sleep apnea     MEDICATIONS: Current Outpatient Prescriptions on File Prior to Visit  Medication Sig Dispense Refill  . aspirin EC 325 MG tablet Take 325 mg by mouth daily.    Marland Kitchen atorvastatin (LIPITOR) 40 MG tablet Take 1 tablet (40 mg total) by mouth daily. 30 tablet 5  . baclofen (LIORESAL) 10 MG tablet TAKE 1/2 TABLET BY MOUTH TWICE A DAY 30 tablet 3  . clindamycin (CLEOCIN) 150 MG capsule Take 1 capsule (150 mg total) by mouth every 6 (six) hours. 28 capsule 0  . diclofenac sodium (VOLTAREN) 1 % GEL Apply 1 application topically 2 (two) times daily as needed (pain). For pain    . ferrous sulfate 325 (65 FE) MG tablet Take 325 mg by mouth daily with breakfast.    . glucagon (GLUCAGEN) 1 MG SOLR injection Inject 1 mg into the vein once as needed for low blood sugar.    . insulin aspart protamine- aspart (NOVOLOG MIX 70/30) (70-30) 100 UNIT/ML injection Inject 0.05 mLs (5 Units total) into the skin 2 (two) times daily with a meal. 10 mL 0  . lisinopril (PRINIVIL,ZESTRIL) 10 MG tablet Take 1 tablet (10 mg total) by mouth daily. 30 tablet 5  . LYRICA 150 MG capsule TAKE ONE CAPSULE BY MOUTH TWICE A DAY 60 capsule 0  . metoprolol succinate (TOPROL-XL) 25 MG 24 hr tablet Take 1 tablet (25 mg total) by mouth daily. 30 tablet 5  . Multiple Vitamins-Minerals (MULTIVITAMIN WITH MINERALS) tablet Take 1 tablet by mouth daily.    . mupirocin ointment (BACTROBAN) 2 % Apply topically 2 (two) times daily as needed. 30 g 0  . pantoprazole (PROTONIX) 40 MG tablet TAKE 1 TABLET BY MOUTH EVERY DAY AT 12 NOON 90 tablet 2   No current facility-administered medications on file prior to visit.    ALLERGIES: Allergies  Allergen Reactions  . Bee Venom Anaphylaxis  . Influenza Vaccines Other (See Comments)    Sick for months    FAMILY HISTORY: Family History  Problem Relation Age of Onset  . Heart attack Mother 5  . Heart disease Mother   . Stroke Mother   .  Hyperlipidemia Mother   . Hypertension Mother   . Colon cancer Neg Hx   . Prostate cancer Neg Hx   . COPD Father   . Peripheral vascular disease Father   . Diabetes Brother   . Heart disease Brother   . Hypertension Brother   . Heart attack Brother   . Diabetes Daughter     SOCIAL HISTORY: Social History   Social History  . Marital Status: Married    Spouse Name: N/A  . Number of Children: 2  . Years of Education: N/A   Occupational History  . disable  Social History Main Topics  . Smoking status: Former Smoker -- 74 years    Quit date: 07/22/2012  . Smokeless tobacco: Current User     Comment: pt states that he is using E-cigs  . Alcohol Use: No  . Drug Use: No  . Sexual Activity: No     Comment: electronic cigarettes no nicotene   Other Topics Concern  . Not on file   Social History Narrative   Lives w/ wife    REVIEW OF SYSTEMS: Constitutional: No fevers, chills, or sweats, no generalized fatigue, change in appetite Eyes: No visual changes, double vision, eye pain Ear, nose and throat: No hearing loss, ear pain, nasal congestion, sore throat Cardiovascular: No chest pain, palpitations Respiratory:  No shortness of breath at rest or with exertion, wheezes GastrointestinaI: No nausea, vomiting, diarrhea, abdominal pain, fecal incontinence Genitourinary:  No dysuria, urinary retention or frequency Musculoskeletal:  back pain Integumentary: No rash, pruritus, skin lesions Neurological: as above Psychiatric: No depression, insomnia, anxiety Endocrine: No palpitations, fatigue, diaphoresis, mood swings, change in appetite, change in weight, increased thirst Hematologic/Lymphatic:  No anemia, purpura, petechiae. Allergic/Immunologic: no itchy/runny eyes, nasal congestion, recent allergic reactions, rashes  PHYSICAL EXAM: Filed Vitals:   06/21/15 0940  BP: 134/76  Pulse: 62   General: No acute distress.  Patient appears well-groomed.  Head:   Normocephalic/atraumatic Eyes:  Not visualized on exam Neck: supple, no paraspinal tenderness, full range of motion Heart:  Regular rate and rhythm Lungs:  Clear to auscultation bilaterally Neurological Exam: alert and oriented to person, place, and time. Attention span and concentration intact, recent and remote memory intact, fund of knowledge intact.  Speech fluent and not dysarthric, language intact.  CN II-XII intact. Fundi not able to be visualized on inspection.  Mildly inncreased tone in upper extremities, 5-/5 right triceps, otherwise muscle strength 5/5 in upper extremities.  Sensation to pinprick and vibration in hands and fingers intact.  Deep tendon reflexes 2+ in upper extremities.  Finger to nose intact.   IMPRESSION: Secondary progressive MS, clinically stable Phantom limb pain  PLAN: 1.  Continue Lyrica 150mg  twice daily and baclofen 5mg   2.  Check Vitamin D level and treat accordingly 3.  May want to consider pain specialist for low back.  Dr. Maryjean Ka in the neurosurgeon office may be able to perform injections for coccydynia. 4.  Follow up in one year.  15 minutes spent face to face with patient, over 50% spent discussing management.  Metta Clines, DO  CC:  [name], MD

## 2015-06-21 NOTE — Patient Instructions (Addendum)
1.  Continue Lyrica 150mg  twice daily and baclofen 5mg  2.  Will get back to you to see if we can refer you to the doctor to help with your back 3.  Check vitamin D level 4.  Follow up in one year

## 2015-06-22 ENCOUNTER — Ambulatory Visit: Payer: Self-pay

## 2015-06-22 ENCOUNTER — Other Ambulatory Visit (INDEPENDENT_AMBULATORY_CARE_PROVIDER_SITE_OTHER): Payer: Medicare Other

## 2015-06-22 ENCOUNTER — Telehealth: Payer: Self-pay

## 2015-06-22 DIAGNOSIS — Z79899 Other long term (current) drug therapy: Secondary | ICD-10-CM | POA: Diagnosis not present

## 2015-06-22 DIAGNOSIS — Z139 Encounter for screening, unspecified: Secondary | ICD-10-CM

## 2015-06-22 DIAGNOSIS — E559 Vitamin D deficiency, unspecified: Secondary | ICD-10-CM | POA: Diagnosis not present

## 2015-06-22 DIAGNOSIS — E569 Vitamin deficiency, unspecified: Secondary | ICD-10-CM | POA: Diagnosis not present

## 2015-06-22 LAB — VITAMIN D 25 HYDROXY (VIT D DEFICIENCY, FRACTURES): VITD: 28.67 ng/mL — ABNORMAL LOW (ref 30.00–100.00)

## 2015-06-22 NOTE — Patient Outreach (Signed)
This RNCM was successful in making contact with patient's wife, Jaime [name], who identified patient by providing his name, date of birth and address.    Wife stated patient was asleep, requested home visit instead.  This RNCM and patient agreed on home visit on December 6 to complete assessment for community care coordination assessment.

## 2015-06-22 NOTE — Telephone Encounter (Signed)
Message relayed to patient. Verbalized understanding and denied questions.   

## 2015-06-22 NOTE — Telephone Encounter (Signed)
-----   Message from Pieter Partridge, DO sent at 06/22/2015 10:42 AM EST ----- Vitamin D level is low.  I would like him to start taking D3 2000 IU daily.  I would like to repeat level prior to follow up.

## 2015-06-23 ENCOUNTER — Other Ambulatory Visit: Payer: Self-pay | Admitting: Pharmacist

## 2015-06-23 NOTE — Patient Outreach (Signed)
[facility]) Care Management  [place]   06/23/2015  PERCY GERSH [birth year] [id number]  Subjective: Jaime [name] is a 74 y.o. male who was referred to [facility] for a medication review due to polypharmacy.  Objective:   Current Medications: Current Outpatient Prescriptions  Medication Sig Dispense Refill  . aspirin EC 325 MG tablet Take 325 mg by mouth daily.    Marland Kitchen atorvastatin (LIPITOR) 40 MG tablet Take 1 tablet (40 mg total) by mouth daily. 30 tablet 5  . baclofen (LIORESAL) 10 MG tablet TAKE 1/2 TABLET BY MOUTH TWICE A DAY 30 tablet 3  . clindamycin (CLEOCIN) 150 MG capsule Take 1 capsule (150 mg total) by mouth every 6 (six) hours. 28 capsule 0  . diclofenac sodium (VOLTAREN) 1 % GEL Apply 1 application topically 2 (two) times daily as needed (pain). For pain    . ferrous sulfate 325 (65 FE) MG tablet Take 325 mg by mouth daily with breakfast.    . glucagon (GLUCAGEN) 1 MG SOLR injection Inject 1 mg into the vein once as needed for low blood sugar.    . insulin aspart protamine- aspart (NOVOLOG MIX 70/30) (70-30) 100 UNIT/ML injection Inject 0.05 mLs (5 Units total) into the skin 2 (two) times daily with a meal. 10 mL 0  . lisinopril (PRINIVIL,ZESTRIL) 10 MG tablet Take 1 tablet (10 mg total) by mouth daily. 30 tablet 5  . LYRICA 150 MG capsule TAKE ONE CAPSULE BY MOUTH TWICE A DAY 60 capsule 0  . metoprolol succinate (TOPROL-XL) 25 MG 24 hr tablet Take 1 tablet (25 mg total) by mouth daily. 30 tablet 5  . Multiple Vitamins-Minerals (MULTIVITAMIN WITH MINERALS) tablet Take 1 tablet by mouth daily.    . mupirocin ointment (BACTROBAN) 2 % Apply topically 2 (two) times daily as needed. 30 g 0  . pantoprazole (PROTONIX) 40 MG tablet TAKE 1 TABLET BY MOUTH EVERY DAY AT 12 NOON 90 tablet 2   No current facility-administered medications for this visit.    Functional Status: In your present state of health, do you have any difficulty performing  the following activities: 11/01/2014 10/25/2014  Hearing? N -  Vision? N -  Difficulty concentrating or making decisions? Y -  Walking or climbing stairs? Y -  Dressing or bathing? Y -  Doing errands, shopping? Tempie Donning    Fall/Depression Screening: PHQ 2/9 Scores 06/13/2015 11/08/2014 08/16/2013  PHQ - 2 Score 0 0 0    Assessment: Drugs sorted by system:  Neurologic/Psychologic: none  Cardiovascular: aspirin, atorvastatin, lisinopril, metoprolol succinate  Pulmonary/Allergy: none  Gastrointestinal: pantoprazole  Endocrine: glucagon, Novolog Mix 70/30  Renal: none  Infectious Diseases: clindamycin, mupirocin ointment  Topical: none  Pain: diclofenac, Lyrica  Vitamins/Minerals/Supplement: ferrous sulfate, MVI  Miscellaneous: baclofen  Findings:  Duplications in therapy: none noted  Gaps in therapy: patient on aspirin, ACEi, beta blocker, and statin  Medications to avoid in the elderly: none noted  Drug interactions: none noted  Inappropriate dose: all doses are appropriate based on age and labs  Other issues noted: all medications appear to be appropriate   Plan: 1. Medication review: no issues noted and no recommendations for any changes. Will close pharmacy case.    Nicoletta Ba, PharmD, [facility] [phone]

## 2015-06-27 ENCOUNTER — Other Ambulatory Visit: Payer: Self-pay

## 2015-06-28 NOTE — Patient Outreach (Signed)
[facility]) Care Management  June 27, 2015  Jaime [name] 1940/11/03 [id number]   Initial home visit for assessment of community care coordination needs. Patient was home with wife and deferred to wife to assist this RNCM with assessment.  Patient is a double amputee, uses motorized wheelchair for mobility, however, wife reports patient lies in bed most of the time due to his advanced MS and bilateral amputation.  Patient is primary caregiver, assisted by daughter who lives in the home.  Couple also has a privately paid aide that comes in to assist a few hours per week to bath and dress patient.  Aide is also able to assist with running errands for wife.  Wife reports she was recently diagnosed with colon cancer herself and had surgery to remove a number of polyps.  Wife states she understands the time is coming for her to think about placement because patient is mostly bed bound, is totally dependent.  Wife describes the process of getting him dressed for appointments has become increasingly more difficult.  Wife states patient is unable to sit for long periods of time because of the MS and experiences great pain when he sits up for a long period of time.  The focus of the visit was support for his wife, Jaime [name], as well as assess both of their needs for community care coordination. The following topics were areas of discussion are as follows:  Advanced Planning: Wife states she understands the need for advanced directive preparation, has the paperwork and her plan is to have paperwork completed in the near future. Denies need for further assistance with this.  Transportation: Patient has handicapped equipped Lucianne Lei which is currently being repaired due to transmission issues. Wife states she plans to use her savings to buy a car, however, admits to having some visual problems.  This RNCM offered information on alternate transportation such as [address], [place] which  provide lower cost transportation than taxi.  Plan: Send update to primary care physician Discuss option of the [facility] Nurse Practitioner, Deloria Lair, NP assist with home assessments as needed. Home visit July 31, 2015

## 2015-07-14 ENCOUNTER — Other Ambulatory Visit: Payer: Self-pay

## 2015-07-14 NOTE — Patient Outreach (Signed)
Unsuccessful attempt made to contact patient via telephone at [phone] for further [place] care coordination.  HIPPA compliant message left for patient and his wife.  Plan: Make another attempt to contact patient via telephone on Tuesday, December 27

## 2015-07-18 ENCOUNTER — Other Ambulatory Visit: Payer: Self-pay

## 2015-07-18 ENCOUNTER — Encounter: Payer: Medicare Other | Admitting: Gastroenterology

## 2015-07-18 DIAGNOSIS — N401 Enlarged prostate with lower urinary tract symptoms: Secondary | ICD-10-CM | POA: Diagnosis not present

## 2015-07-18 DIAGNOSIS — N138 Other obstructive and reflux uropathy: Secondary | ICD-10-CM | POA: Diagnosis not present

## 2015-07-18 NOTE — Patient Outreach (Signed)
Unsuccessful attempt made to contact patient and or caregiver at [phone] for assessment of community care coordination needs.  Plan: Home visit scheduled for January 2017 previously scheduled.

## 2015-07-21 ENCOUNTER — Encounter: Payer: Medicare Other | Admitting: Gastroenterology

## 2015-07-27 ENCOUNTER — Telehealth: Payer: Self-pay | Admitting: Internal Medicine

## 2015-07-27 NOTE — Telephone Encounter (Signed)
Wife left VM that she needed to reschedule patients appointment for Friday. Called to reschedule but got no answer. Left message on VM that appt was being cancelled and patient could call back to reschedule

## 2015-07-28 ENCOUNTER — Ambulatory Visit: Payer: Medicare Other | Admitting: Internal Medicine

## 2015-07-28 NOTE — Telephone Encounter (Signed)
Wife left another msg to reschedule appt, called and unable to leave a message. Machine kept saying it did not hear anything.

## 2015-07-29 ENCOUNTER — Other Ambulatory Visit: Payer: Self-pay | Admitting: Neurology

## 2015-07-31 ENCOUNTER — Other Ambulatory Visit: Payer: Self-pay

## 2015-07-31 NOTE — Telephone Encounter (Signed)
Last OV: 06/21/15 Next OV: 06/21/16

## 2015-07-31 NOTE — Patient Outreach (Signed)
[facility]) Care Management  07/31/2015  OLNEY LAFAZIA 11/26/1940 [id number]   Successful telephone contact made with patient and caregiver, wife. Wife was able to satisfy HIPPA identifiers by providing date of birth and address. Home visit rescheduled from today to January 13 due to inclement weather and poor road conditions.  Case Management care plan updated, wife states she continues to work on advanced directive, however because of weather and transportation issues.    Patient is a double lower extremity amputee and is mostly bed ridden. Wife and daughter are his caregiver, both living in home.  Rescheduled home visit scheduled for today, rescheduled for January 13.  Wife informed that Deloria Lair, NP [facility] will be assisting with home assessments as previously discussed  Will contact Kayleen Memos to see when she plans to do home visit, update patient/wife during home visit on Friday.  Home visit to determine caregiver needs.

## 2015-08-03 ENCOUNTER — Encounter: Payer: Self-pay | Admitting: Gastroenterology

## 2015-08-03 ENCOUNTER — Other Ambulatory Visit: Payer: Self-pay | Admitting: Internal Medicine

## 2015-08-04 ENCOUNTER — Ambulatory Visit: Payer: Self-pay

## 2015-08-07 ENCOUNTER — Ambulatory Visit: Payer: Medicare Other | Admitting: Gastroenterology

## 2015-08-11 ENCOUNTER — Other Ambulatory Visit: Payer: Self-pay

## 2015-08-11 ENCOUNTER — Telehealth: Payer: Self-pay | Admitting: Internal Medicine

## 2015-08-11 NOTE — Patient Outreach (Signed)
This RNCM received a call from patient's wife, [name], who identified patient's identity by providing patient's date of birth and address.  wife states patient has congestions and she is not able to get him to the doctor.  This RNCM made telephone call to [facility] ([phone]) [facility] to advise primary care.  Spoke with Minette Headland who stated she would pass information on to Dr. Larose Kells who is currently in an examination room with another patient.    Call made to patient's wife and to advise her that due to call being so close to office closing ([phone]), office may not call back tonight.  Wife advised to call [phone] if patient develops respiratory distress and/or fever.  Patient is a double amputee, his transport vehicle is currently in the garage being repaired, therefore wife is unable to transport.  Deloria Lair, [facility] GNP has scheduled an appointment for an assessment in the home on Monday, January 23 at 10 am.  Kayleen Memos advised of this RNCM's intervention notifying primary care physician with patient's current complaint.  Plan: Telephone call on Tuesday, January 24 to schedule home visit during week of January 30

## 2015-08-11 NOTE — Telephone Encounter (Signed)
Caller name: Pam with [facility] Case Management Can be reached: [phone]  Reason for call: Pam called and stated pt wife called. He is having same symptoms wife had and was in 08/04/15 and received antibiotics. They are requesting antibiotics for pt. They also have call in for NP thru [facility] to do home visit but no response yet.

## 2015-08-13 ENCOUNTER — Encounter (HOSPITAL_COMMUNITY): Payer: Self-pay | Admitting: Emergency Medicine

## 2015-08-13 ENCOUNTER — Inpatient Hospital Stay (HOSPITAL_COMMUNITY)
Admission: EM | Admit: 2015-08-13 | Discharge: 2015-08-14 | DRG: 202 | Disposition: A | Discharge status: Home / Self Care | Payer: Medicare Other | Attending: Internal Medicine | Admitting: Internal Medicine

## 2015-08-13 ENCOUNTER — Emergency Department (HOSPITAL_COMMUNITY): Payer: Medicare Other

## 2015-08-13 DIAGNOSIS — I5042 Chronic combined systolic (congestive) and diastolic (congestive) heart failure: Secondary | ICD-10-CM | POA: Diagnosis not present

## 2015-08-13 DIAGNOSIS — I5022 Chronic systolic (congestive) heart failure: Secondary | ICD-10-CM

## 2015-08-13 DIAGNOSIS — I2510 Atherosclerotic heart disease of native coronary artery without angina pectoris: Secondary | ICD-10-CM | POA: Diagnosis present

## 2015-08-13 DIAGNOSIS — Z91030 Bee allergy status: Secondary | ICD-10-CM

## 2015-08-13 DIAGNOSIS — Z87891 Personal history of nicotine dependence: Secondary | ICD-10-CM

## 2015-08-13 DIAGNOSIS — G35 Multiple sclerosis: Secondary | ICD-10-CM | POA: Diagnosis present

## 2015-08-13 DIAGNOSIS — R0600 Dyspnea, unspecified: Secondary | ICD-10-CM | POA: Diagnosis not present

## 2015-08-13 DIAGNOSIS — J40 Bronchitis, not specified as acute or chronic: Secondary | ICD-10-CM | POA: Diagnosis present

## 2015-08-13 DIAGNOSIS — I491 Atrial premature depolarization: Secondary | ICD-10-CM | POA: Diagnosis present

## 2015-08-13 DIAGNOSIS — Z8673 Personal history of transient ischemic attack (TIA), and cerebral infarction without residual deficits: Secondary | ICD-10-CM

## 2015-08-13 DIAGNOSIS — Z794 Long term (current) use of insulin: Secondary | ICD-10-CM | POA: Diagnosis not present

## 2015-08-13 DIAGNOSIS — R001 Bradycardia, unspecified: Secondary | ICD-10-CM

## 2015-08-13 DIAGNOSIS — Z89611 Acquired absence of right leg above knee: Secondary | ICD-10-CM | POA: Diagnosis not present

## 2015-08-13 DIAGNOSIS — J209 Acute bronchitis, unspecified: Principal | ICD-10-CM | POA: Diagnosis present

## 2015-08-13 DIAGNOSIS — E1151 Type 2 diabetes mellitus with diabetic peripheral angiopathy without gangrene: Secondary | ICD-10-CM | POA: Diagnosis present

## 2015-08-13 DIAGNOSIS — Z823 Family history of stroke: Secondary | ICD-10-CM

## 2015-08-13 DIAGNOSIS — Z86010 Personal history of colonic polyps: Secondary | ICD-10-CM

## 2015-08-13 DIAGNOSIS — F0390 Unspecified dementia without behavioral disturbance: Secondary | ICD-10-CM | POA: Diagnosis present

## 2015-08-13 DIAGNOSIS — Z8249 Family history of ischemic heart disease and other diseases of the circulatory system: Secondary | ICD-10-CM

## 2015-08-13 DIAGNOSIS — I110 Hypertensive heart disease with heart failure: Secondary | ICD-10-CM | POA: Diagnosis present

## 2015-08-13 DIAGNOSIS — M419 Scoliosis, unspecified: Secondary | ICD-10-CM | POA: Diagnosis present

## 2015-08-13 DIAGNOSIS — Z89612 Acquired absence of left leg above knee: Secondary | ICD-10-CM | POA: Diagnosis not present

## 2015-08-13 DIAGNOSIS — Z7982 Long term (current) use of aspirin: Secondary | ICD-10-CM | POA: Diagnosis not present

## 2015-08-13 DIAGNOSIS — I509 Heart failure, unspecified: Secondary | ICD-10-CM | POA: Diagnosis not present

## 2015-08-13 DIAGNOSIS — I440 Atrioventricular block, first degree: Secondary | ICD-10-CM | POA: Diagnosis present

## 2015-08-13 DIAGNOSIS — I255 Ischemic cardiomyopathy: Secondary | ICD-10-CM | POA: Diagnosis not present

## 2015-08-13 DIAGNOSIS — G4730 Sleep apnea, unspecified: Secondary | ICD-10-CM | POA: Diagnosis present

## 2015-08-13 DIAGNOSIS — R0989 Other specified symptoms and signs involving the circulatory and respiratory systems: Secondary | ICD-10-CM

## 2015-08-13 DIAGNOSIS — I252 Old myocardial infarction: Secondary | ICD-10-CM | POA: Diagnosis not present

## 2015-08-13 DIAGNOSIS — Z887 Allergy status to serum and vaccine status: Secondary | ICD-10-CM | POA: Diagnosis not present

## 2015-08-13 DIAGNOSIS — E785 Hyperlipidemia, unspecified: Secondary | ICD-10-CM | POA: Diagnosis present

## 2015-08-13 DIAGNOSIS — Z833 Family history of diabetes mellitus: Secondary | ICD-10-CM

## 2015-08-13 DIAGNOSIS — Z955 Presence of coronary angioplasty implant and graft: Secondary | ICD-10-CM | POA: Diagnosis not present

## 2015-08-13 DIAGNOSIS — I272 Other secondary pulmonary hypertension: Secondary | ICD-10-CM | POA: Diagnosis present

## 2015-08-13 DIAGNOSIS — R05 Cough: Secondary | ICD-10-CM | POA: Diagnosis not present

## 2015-08-13 LAB — COMPREHENSIVE METABOLIC PANEL
ALBUMIN: 3.3 g/dL — AB (ref 3.5–5.0)
ALT: 31 U/L (ref 17–63)
ANION GAP: 6 (ref 5–15)
AST: 30 U/L (ref 15–41)
Alkaline Phosphatase: 51 U/L (ref 38–126)
BUN: 14 mg/dL (ref 6–20)
CHLORIDE: 110 mmol/L (ref 101–111)
CO2: 27 mmol/L (ref 22–32)
Calcium: 9.3 mg/dL (ref 8.9–10.3)
Creatinine, Ser: 0.64 mg/dL (ref 0.61–1.24)
GFR calc non Af Amer: >60 mL/min (ref >60)
GLUCOSE: 76 mg/dL (ref 65–99)
POTASSIUM: 4.2 mmol/L (ref 3.5–5.1)
SODIUM: 143 mmol/L (ref 135–145)
TOTAL PROTEIN: 7.1 g/dL (ref 6.5–8.1)
Total Bilirubin: 1 mg/dL (ref 0.3–1.2)

## 2015-08-13 LAB — GLUCOSE, CAPILLARY: Glucose-Capillary: 172 mg/dL — ABNORMAL HIGH (ref 65–99)

## 2015-08-13 LAB — CBC
HEMATOCRIT: 44.7 % (ref 39.0–52.0)
HEMOGLOBIN: 15.4 g/dL (ref 13.0–17.0)
MCH: 27.4 pg (ref 26.0–34.0)
MCHC: 34.5 g/dL (ref 30.0–36.0)
MCV: 79.5 fL (ref 78.0–100.0)
Platelets: 178 10*3/uL (ref 150–400)
RBC: 5.62 MIL/uL (ref 4.22–5.81)
RDW: 16.1 % — ABNORMAL HIGH (ref 11.5–15.5)
WBC: 8.3 10*3/uL (ref 4.0–10.5)

## 2015-08-13 LAB — TROPONIN I: Troponin I: <0.03 ng/mL (ref <0.031)

## 2015-08-13 LAB — CBC WITH DIFFERENTIAL/PLATELET
Basophils Absolute: 0 10*3/uL (ref 0.0–0.1)
Basophils Relative: 0 %
EOS ABS: 0.1 10*3/uL (ref 0.0–0.7)
Eosinophils Relative: 2 %
HCT: 43.1 % (ref 39.0–52.0)
HEMOGLOBIN: 13.9 g/dL (ref 13.0–17.0)
LYMPHS ABS: 2 10*3/uL (ref 0.7–4.0)
LYMPHS PCT: 29 %
MCH: 26.3 pg (ref 26.0–34.0)
MCHC: 32.3 g/dL (ref 30.0–36.0)
MCV: 81.5 fL (ref 78.0–100.0)
MONOS PCT: 7 %
Monocytes Absolute: 0.5 10*3/uL (ref 0.1–1.0)
NEUTROS PCT: 63 %
Neutro Abs: 4.3 10*3/uL (ref 1.7–7.7)
Platelets: 160 10*3/uL (ref 150–400)
RBC: 5.29 MIL/uL (ref 4.22–5.81)
RDW: 16 % — ABNORMAL HIGH (ref 11.5–15.5)
WBC: 6.9 10*3/uL (ref 4.0–10.5)

## 2015-08-13 LAB — BRAIN NATRIURETIC PEPTIDE: B Natriuretic Peptide: 208.6 pg/mL — ABNORMAL HIGH (ref 0.0–100.0)

## 2015-08-13 MED ORDER — ENOXAPARIN SODIUM 40 MG/0.4ML ~~LOC~~ SOLN
40.0000 mg | SUBCUTANEOUS | Status: DC
  Administered 2015-08-13: 40 mg via SUBCUTANEOUS
  Filled 2015-08-13: qty 0.4

## 2015-08-13 MED ORDER — ASPIRIN EC 325 MG PO TBEC
325.0000 mg | DELAYED_RELEASE_TABLET | Freq: Every day | ORAL | Status: DC
Start: 2015-08-13 — End: 2015-08-14
  Administered 2015-08-13 – 2015-08-14 (×2): 325 mg via ORAL
  Filled 2015-08-13 (×2): qty 1

## 2015-08-13 MED ORDER — PANTOPRAZOLE SODIUM 40 MG PO TBEC
40.0000 mg | DELAYED_RELEASE_TABLET | Freq: Every day | ORAL | Status: DC
  Administered 2015-08-14: 40 mg via ORAL
  Filled 2015-08-13: qty 1

## 2015-08-13 MED ORDER — LISINOPRIL 10 MG PO TABS
10.0000 mg | ORAL_TABLET | Freq: Every day | ORAL | Status: DC
  Administered 2015-08-14: 10 mg via ORAL
  Filled 2015-08-13: qty 1

## 2015-08-13 MED ORDER — ATORVASTATIN CALCIUM 40 MG PO TABS
40.0000 mg | ORAL_TABLET | Freq: Every day | ORAL | Status: DC
  Administered 2015-08-13 – 2015-08-14 (×2): 40 mg via ORAL
  Filled 2015-08-13 (×2): qty 1

## 2015-08-13 MED ORDER — INSULIN ASPART PROT & ASPART (70-30 MIX) 100 UNIT/ML ~~LOC~~ SUSP
10.0000 [IU] | Freq: Every day | SUBCUTANEOUS | Status: DC
  Filled 2015-08-13: qty 10

## 2015-08-13 MED ORDER — LISINOPRIL 10 MG PO TABS: 10.0000 mg | ORAL_TABLET | Freq: Every day | ORAL | Status: DC

## 2015-08-13 MED ORDER — DOXYCYCLINE HYCLATE 100 MG PO TABS
100.0000 mg | ORAL_TABLET | Freq: Two times a day (BID) | ORAL | Status: DC
  Administered 2015-08-13 – 2015-08-14 (×2): 100 mg via ORAL
  Filled 2015-08-13 (×2): qty 1

## 2015-08-13 MED ORDER — INSULIN ASPART PROT & ASPART (70-30 MIX) 100 UNIT/ML ~~LOC~~ SUSP: 10.0000 [IU] | Freq: Two times a day (BID) | SUBCUTANEOUS | Status: DC

## 2015-08-13 MED ORDER — BACLOFEN 5 MG HALF TABLET
5.0000 mg | ORAL_TABLET | Freq: Two times a day (BID) | ORAL | Status: DC
  Administered 2015-08-14 (×2): 5 mg via ORAL
  Filled 2015-08-13 (×4): qty 1

## 2015-08-13 MED ORDER — PREGABALIN 75 MG PO CAPS
150.0000 mg | ORAL_CAPSULE | Freq: Two times a day (BID) | ORAL | Status: DC
  Administered 2015-08-13 – 2015-08-14 (×2): 150 mg via ORAL
  Filled 2015-08-13 (×2): qty 2

## 2015-08-13 MED ORDER — VITAMIN D 1000 UNITS PO TABS
2000.0000 [IU] | ORAL_TABLET | Freq: Every day | ORAL | Status: DC
  Administered 2015-08-14 (×2): 2000 [IU] via ORAL
  Filled 2015-08-13 (×2): qty 2

## 2015-08-13 MED ORDER — GUAIFENESIN-DM 100-10 MG/5ML PO SYRP
15.0000 mL | ORAL_SOLUTION | ORAL | Status: DC | PRN
  Administered 2015-08-14 (×2): 15 mL via ORAL
  Filled 2015-08-13 (×3): qty 15

## 2015-08-13 MED ORDER — FUROSEMIDE 10 MG/ML IJ SOLN
20.0000 mg | Freq: Once | INTRAMUSCULAR | Status: AC
  Administered 2015-08-13: 20 mg via INTRAVENOUS
  Filled 2015-08-13: qty 2

## 2015-08-13 MED ORDER — METOPROLOL SUCCINATE ER 25 MG PO TB24: 25.0000 mg | ORAL_TABLET | Freq: Every day | ORAL | Status: DC

## 2015-08-13 MED ORDER — INSULIN ASPART PROT & ASPART (70-30 MIX) 100 UNIT/ML ~~LOC~~ SUSP
45.0000 [IU] | Freq: Every day | SUBCUTANEOUS | Status: DC
  Filled 2015-08-13: qty 10

## 2015-08-13 MED ORDER — INSULIN ASPART 100 UNIT/ML ~~LOC~~ SOLN
0.0000 [IU] | Freq: Three times a day (TID) | SUBCUTANEOUS | Status: DC
  Administered 2015-08-14: 1 [IU] via SUBCUTANEOUS
  Administered 2015-08-14: 2 [IU] via SUBCUTANEOUS

## 2015-08-13 NOTE — H&P (Signed)
History and Physical  Jaime [name] [id number] DOB: [birth year] DOA: 08/13/2015  PCP: [name], MD   Chief Complaint: chest congestion   History of Present Illness:  Patient is a 74 yo male with history of systolic HF, DM, HTN, PVD s/p bilateral AKA who came here with cc of chest congestion and cough for 2 weeks. He denies any chest pain or dyspnea. No orthopnea. Cough is productive but it's hard for him to cough sputum out. No fever/chills/N/V/D/C/abd pain. No skin breakdown/ulcer.   Review of Systems:  CONSTITUTIONAL:  No night sweats.  No fatigue, malaise, lethargy.  No fever or chills. Eyes:  No visual changes.  No eye pain.  No eye discharge.   ENT:    No epistaxis.  No sinus pain.  No sore throat.  No ear pain.  No congestion. RESPIRATORY:  +cough.  No wheeze.  No hemoptysis.  No shortness of breath. CARDIOVASCULAR:  No chest pains.  No palpitations. GASTROINTESTINAL:  No abdominal pain.  No nausea or vomiting.  No diarrhea or constipation.  No hematemesis.  No hematochezia.  No melena. GENITOURINARY:  No urgency.  No frequency.  No dysuria.  No hematuria.  No obstructive symptoms.  No discharge.  No pain.  No significant abnormal bleeding. MUSCULOSKELETAL:  No musculoskeletal pain.  No joint swelling.  No arthritis. NEUROLOGICAL:  No confusion.  No weakness. No headache. No seizure. PSYCHIATRIC:  No depression. No anxiety. No suicidal ideation. SKIN:  No rashes.  No lesions.  No wounds. ENDOCRINE:  No unexplained weight loss.  No polydipsia.  No polyuria.  No polyphagia. HEMATOLOGIC:  No anemia.  No purpura.  No petechiae.  No bleeding.  ALLERGIC AND IMMUNOLOGIC:  No pruritus.  No swelling Other:  Past Medical and Surgical History:   Past Medical History  Diagnosis Date  . Hyperlipidemia   . Cardiomyopathy, ischemic     EF 45% per ECHO 2008  //   EF 25%, echo, August, 2013 //  Echo (8/15):  Mild LVH, EF 30-35%, ant-lat and lat AK, inf HK, Gr 1 DD, mild MR,  mild LAE  . Pulmonary hypertension ([place])     moderate ECHO Jan 2008  . Systolic heart failure   . Multiple sclerosis ([place]) [place] -- LAST VISIT 11-20-2010  NOTE W/ CHART  . Lung nodule     resolved 11-2006 CT Chest  . Tobacco abuse     quit   . Increased prostate specific antigen (PSA) velocity   . Urinary retention     dx ~ 2-12, like from [place], now with a catheter, saw urology  . Carotid artery disease ([place])     a.  Doppler, February, 2012, 0-39% bilateral,Mild smooth plaque;  b.  Carotid US (8/15):  Bilateral 1-39% ICA >>> F/u 2 years  . Chronic indwelling Foley catheter   . Scoliosis associated with other condition   . Fatigue SEVERE  . Impotence   . Hypertension   . H/O pleural effusion 2008    POST THORACENTESIS  . History of colon polyps PRECANCEROUS  . Insomnia   . CAD (coronary artery disease) CARDIOLOGIST- DR KATZ-- VISIT 06-05-2011 IN EPIC    non-STEMI, 2007.Marland Kitchenoccluded circumflex.. Taxus stent placed...residual 80% LAD...50% RCA  . Urinary tract infection     hx of  . Atherosclerotic PVD with ulceration ([place])     left foot  . PAC (premature atrial contraction)     December, 2013  . Diabetes  mellitus     INSULIN-DEPENDent  . Dementia   . Constipation   . TIA (transient ischemic attack)   . Sleep apnea    Past Surgical History  Procedure Laterality Date  . Hernia repair  1990    (R)  . Thoracentesis  2008    PLEURAL EFFUSION  . Cystoscopy  07/30/2011    Procedure: CYSTOSCOPY;  Surgeon: Hanley Ben, MD;  Location: [facility];  Service: Urology;  Laterality: N/A;  . Transurethral resection of prostate  07/30/2011    Procedure: TRANSURETHRAL RESECTION OF THE PROSTATE (TURP);  Surgeon: Hanley Ben, MD;  Location: [facility];  Service: Urology;  Laterality: N/A;  . Femoral-tibial bypass graft  03/11/2012    Procedure: BYPASS GRAFT FEMORAL-TIBIAL ARTERY;  Surgeon: Conrad Craighead, MD;  Location: [place];   Service: Vascular;  Laterality: Left;  Left Femoral -Tibial trunk bypass, Endarterectomy of Tibial- Peroneal trunk with vein angioplasty.  . Intraoperative arteriogram  03/11/2012    Procedure: INTRA OPERATIVE ARTERIOGRAM;  Surgeon: Conrad Metzger, MD;  Location: [place];  Service: Vascular;  Laterality: Left;  . Femoral-popliteal bypass graft  03/11/2012    Procedure: BYPASS GRAFT FEMORAL-POPLITEAL ARTERY;  Surgeon: Conrad Ellijay, MD;  Location: [place];  Service: Vascular;  Laterality: Left;  embolectomy left lower leg  . Amputation  03/17/2012    Procedure: AMPUTATION ABOVE KNEE;  Surgeon: Conrad [place], MD;  Location: [place];  Service: Vascular;  Laterality: Left;  . Coronary angioplasty with stent placement  05-01-2006    OCCLUDED CIRCUMFLEX -- TAXUS STENT PLACMENT  AND RESIDUAL 80% LAD,  50% RCA  . Amputation  06/03/2012    Procedure: AMPUTATION ABOVE KNEE;  Surgeon: Conrad Grayling, MD;  Location: [place];  Service: Vascular;  Laterality: Right;  . Lower extremity angiogram Bilateral 12/17/2011    Procedure: LOWER EXTREMITY ANGIOGRAM;  Surgeon: Serafina Mitchell, MD;  Location: [facility] CATH LAB;  Service: Cardiovascular;  Laterality: Bilateral;  bil lower extrem angio  . Abdominal angiogram  12/17/2011    Procedure: ABDOMINAL ANGIOGRAM;  Surgeon: Serafina Mitchell, MD;  Location: [facility] CATH LAB;  Service: Cardiovascular;;  . Abdominal aortagram N/A 08/19/2013    Procedure: ABDOMINAL Maxcine Ham;  Surgeon: Conrad , MD;  Location: [facility] CATH LAB;  Service: Cardiovascular;  Laterality: N/A;  . Ercp N/A 08/25/2014    Procedure: ENDOSCOPIC RETROGRADE CHOLANGIOPANCREATOGRAPHY (ERCP);  Surgeon: Ladene Artist, MD;  Location: [facility] ENDOSCOPY;  Service: Endoscopy;  Laterality: N/A;  . 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 Above knee leg amputation Right     05/2012  . Cholecystectomy  10/25/2014    DR [name]  . Cholecystectomy N/A 10/25/2014    Procedure: LAPAROSCOPIC CHOLECYSTECTOMY ;  Surgeon: Coralie Keens, MD;   Location: [place];  Service: General;  Laterality: N/A;    Social History:   reports that he quit smoking about 3 years ago. He uses smokeless tobacco. He reports that he does not drink alcohol or use illicit drugs.   Allergies  Allergen Reactions  . Bee Venom Anaphylaxis  . Influenza Vaccines Other (See Comments)    Sick for months    Family History  Problem Relation Age of Onset  . Heart attack Mother 43  . Heart disease Mother   . Stroke Mother   . Hyperlipidemia Mother   . Hypertension Mother   . Colon cancer Neg Hx   . Prostate cancer Neg Hx   . COPD Father   .  Peripheral vascular disease Father   . Diabetes Brother   . Heart disease Brother   . Hypertension Brother   . Heart attack Brother   . Diabetes Daughter     Prior to Admission medications   Medication Sig Start Date End Date Taking? Authorizing Provider  aspirin EC 325 MG tablet Take 325 mg by mouth daily.   Yes Historical Provider, MD  atorvastatin (LIPITOR) 40 MG tablet Take 1 tablet (40 mg total) by mouth daily. Patient taking differently: Take 40 mg by mouth daily at 6 PM.  04/24/15  Yes Colon Branch, MD  baclofen (LIORESAL) 10 MG tablet TAKE 1/2 TABLET BY MOUTH TWICE A DAY 06/14/15  Yes Pieter Partridge, DO  Cholecalciferol (VITAMIN D) 2000 units CAPS Take 1 capsule by mouth daily.    Yes Historical Provider, MD  diclofenac sodium (VOLTAREN) 1 % GEL Apply 1 application topically 2 (two) times daily as needed (pain). For pain 03/09/12  Yes Charlett Blake, MD  ferrous sulfate 325 (65 FE) MG tablet Take 325 mg by mouth daily with breakfast.   Yes Historical Provider, MD  glucagon (GLUCAGEN) 1 MG SOLR injection Inject 1 mg into the vein once as needed for low blood sugar.   Yes Historical Provider, MD  guaiFENesin-dextromethorphan (ROBITUSSIN DM) 100-10 MG/5ML syrup Take 15 mLs by mouth every 4 (four) hours as needed for cough.   Yes Historical Provider, MD  insulin aspart protamine- aspart (NOVOLOG MIX 70/30)  (70-30) 100 UNIT/ML injection Inject 0.05 mLs (5 Units total) into the skin 2 (two) times daily with a meal. Patient taking differently: Inject 10-45 Units into the skin 2 (two) times daily with a meal. 45 units for blood sugar > 120 at breakfast (<100 not given), 10 units every evening 11/03/14  Yes Belkys A Regalado, MD  lisinopril (PRINIVIL,ZESTRIL) 10 MG tablet Take 1 tablet (10 mg total) by mouth daily. 04/17/15  Yes Colon Branch, MD  LYRICA 150 MG capsule TAKE ONE CAPSULE BY MOUTH TWICE A DAY 07/31/15  Yes Pieter Partridge, DO  metoprolol succinate (TOPROL-XL) 25 MG 24 hr tablet Take 1 tablet (25 mg total) by mouth daily. 04/24/15  Yes Colon Branch, MD  Multiple Vitamins-Minerals (MULTIVITAMIN WITH MINERALS) tablet Take 1 tablet by mouth daily.   Yes Historical Provider, MD  mupirocin ointment (BACTROBAN) 2 % Apply topically 2 (two) times daily as needed. Patient taking differently: Apply 1 application topically 2 (two) times daily as needed (for rash).  06/01/15  Yes Colon Branch, MD  pantoprazole (PROTONIX) 40 MG tablet Take 1 tablet (40 mg total) by mouth daily with lunch. 08/04/15  Yes Colon Branch, MD  clindamycin (CLEOCIN) 150 MG capsule Take 1 capsule (150 mg total) by mouth every 6 (six) hours. Patient not taking: Reported on 06/27/2015 06/02/15   Konrad Felix, PA    Physical Exam: BP 127/77 mmHg  Pulse 44  Temp(Src) 97.6 F (36.4 C) (Oral)  Resp 12  SpO2 97%  GENERAL : Well developed, well nourished, alert and cooperative, and appears to be in no acute distress. HEAD: normocephalic. EYES: vision is grossly intact. EARS:  hearing grossly intact. THROAT: Oral cavity and pharynx normal.  NECK: Neck supple CARDIAC: Normal S1 and S2. No S3, S4 or murmurs. Bradyacardiac. LUNGS: Clear to auscultation ABDOMEN: Positive bowel sounds. Soft, nondistended, nontender. No guarding or rebound. No masses. EXTREMITIES: .bilateral AKA NEUROLOGICAL: The mental examination revealed the patient was oriented  to person, place, and  time.CN II-XII intact.  SKIN: Skin normal color PSYCHIATRIC:  The patient was able to demonstrate good judgement and reason, without hallucinations, abnormal affect or abnormal behaviors during the examination. Patient is not suicidal.          Labs on Admission:  Reviewed.   Radiological Exams on Admission: Dg Chest 2 View  08/13/2015  CLINICAL DATA:  Cough for 2 weeks. EXAM: CHEST  2 VIEW COMPARISON:  11/08/2014 FINDINGS: The cardiac silhouette is enlarged. Mediastinal contours appear intact. There is no evidence of focal airspace consolidation, pleural effusion or pneumothorax. Osseous structures are without acute abnormality. Soft tissues are grossly normal. IMPRESSION: No active cardiopulmonary disease. Electronically Signed   By: Fidela Salisbury M.D.   On: 08/13/2015 13:21    EKG:  Independently reviewed. Sinus brady with PVC/PAC  Assessment/Plan  Chest congestion and cough are possibly due to acute bronchitis vs. Acute systolic heart failure.  Will start on doxy for 7 days.  No orthopnea but BNP is elevated with history of systolic HF. Last echo in 2016: Left ventricle: The cavity size was normal. Systolic function was moderately reduced. The estimated ejection fraction was in the range of 35% to 40%. Diffuse hypokinesis. Doppler parameters are consistent with abnormal left ventricular relaxation (grade 1 diastolic dysfunction). Will repeat Echo EKG non ischemic, T#1 neg. No chest pain. Will check trop in am.  H/o CAD: s/p stent placement. Cont asp and statin.   Bradycardia: sinus with PVC/PAC, due to BB. Cont home meds. Asymptomatic.   DM: cont home meds  HTN: cont home meds.   DVT prophylaxis: [place] enoxaparin GI prophylaxis:PPI Code Status: Full    Gennaro Africa M.D [facility]

## 2015-08-13 NOTE — ED Notes (Signed)
Attempted report 

## 2015-08-13 NOTE — Progress Notes (Signed)
7:51 PM report received from [employer]

## 2015-08-13 NOTE — ED Provider Notes (Signed)
CSN: [id number]     Arrival date & time 08/13/15  1054 History   First MD Initiated Contact with Patient 08/13/15 1502     Chief Complaint  Patient presents with  . URI  . Cough    HPI  Patient presents with concern of cough and congestion. Symptoms have been present for several weeks, not improved in spite of using 2 bottles of Robitussin during the illness. Patient was generally well prior to the onset. He is unsure of weight gain, weight loss. No dyspnea, no chest pain. No fever, chills. There is minimal change in generalized weakness, but the patient has multiple sclerosis. Patient also has multiple other medical issues, including pulmonary hypertension, CHF, CAD Patient is here with his wife.  Past Medical History  Diagnosis Date  . Hyperlipidemia   . Cardiomyopathy, ischemic     EF 45% per ECHO 2008  //   EF 25%, echo, August, 2013 //  Echo (8/15):  Mild LVH, EF 30-35%, ant-lat and lat AK, inf HK, Gr 1 DD, mild MR, mild LAE  . Pulmonary hypertension ([place])     moderate ECHO Jan 2008  . Systolic heart failure   . Multiple sclerosis ([place]) [place] -- LAST VISIT 11-20-2010  NOTE W/ CHART  . Lung nodule     resolved 11-2006 CT Chest  . Tobacco abuse     quit   . Increased prostate specific antigen (PSA) velocity   . Urinary retention     dx ~ 2-12, like from [place], now with a catheter, saw urology  . Carotid artery disease ([place])     a.  Doppler, February, 2012, 0-39% bilateral,Mild smooth plaque;  b.  Carotid US (8/15):  Bilateral 1-39% ICA >>> F/u 2 years  . Chronic indwelling Foley catheter   . Scoliosis associated with other condition   . Fatigue SEVERE  . Impotence   . Hypertension   . H/O pleural effusion 2008    POST THORACENTESIS  . History of colon polyps PRECANCEROUS  . Insomnia   . CAD (coronary artery disease) CARDIOLOGIST- DR KATZ-- VISIT 06-05-2011 IN EPIC    non-STEMI, 2007.Marland Kitchenoccluded circumflex.. Taxus stent placed...residual 80%  LAD...50% RCA  . Urinary tract infection     hx of  . Atherosclerotic PVD with ulceration ([place])     left foot  . PAC (premature atrial contraction)     December, 2013  . Diabetes mellitus     INSULIN-DEPENDent  . Dementia   . Constipation   . TIA (transient ischemic attack)   . Sleep apnea    Past Surgical History  Procedure Laterality Date  . Hernia repair  1990    (R)  . Thoracentesis  2008    PLEURAL EFFUSION  . Cystoscopy  07/30/2011    Procedure: CYSTOSCOPY;  Surgeon: Hanley Ben, MD;  Location: [facility];  Service: Urology;  Laterality: N/A;  . Transurethral resection of prostate  07/30/2011    Procedure: TRANSURETHRAL RESECTION OF THE PROSTATE (TURP);  Surgeon: Hanley Ben, MD;  Location: [facility];  Service: Urology;  Laterality: N/A;  . Femoral-tibial bypass graft  03/11/2012    Procedure: BYPASS GRAFT FEMORAL-TIBIAL ARTERY;  Surgeon: Conrad [place], MD;  Location: [place];  Service: Vascular;  Laterality: Left;  Left Femoral -Tibial trunk bypass, Endarterectomy of Tibial- Peroneal trunk with vein angioplasty.  . Intraoperative arteriogram  03/11/2012    Procedure: INTRA OPERATIVE ARTERIOGRAM;  Surgeon: Aaron Edelman  Starlyn Skeans, MD;  Location: [place];  Service: Vascular;  Laterality: Left;  . Femoral-popliteal bypass graft  03/11/2012    Procedure: BYPASS GRAFT FEMORAL-POPLITEAL ARTERY;  Surgeon: Conrad [place], MD;  Location: [place];  Service: Vascular;  Laterality: Left;  embolectomy left lower leg  . Amputation  03/17/2012    Procedure: AMPUTATION ABOVE KNEE;  Surgeon: Conrad Tal[place], MD;  Location: [place];  Service: Vascular;  Laterality: Left;  . Coronary angioplasty with stent placement  05-01-2006    OCCLUDED CIRCUMFLEX -- TAXUS STENT PLACMENT  AND RESIDUAL 80% LAD,  50% RCA  . Amputation  06/03/2012    Procedure: AMPUTATION ABOVE KNEE;  Surgeon: Conrad [place], MD;  Location: [place];  Service: Vascular;  Laterality: Right;  . Lower extremity angiogram  Bilateral 12/17/2011    Procedure: LOWER EXTREMITY ANGIOGRAM;  Surgeon: Serafina Mitchell, MD;  Location: [facility] CATH LAB;  Service: Cardiovascular;  Laterality: Bilateral;  bil lower extrem angio  . Abdominal angiogram  12/17/2011    Procedure: ABDOMINAL ANGIOGRAM;  Surgeon: Serafina Mitchell, MD;  Location: [facility] C[facility] LAB;  Service: Cardiovascular;;  . Abdominal aortagram N/A 08/19/2013    Procedure: ABDOMINAL Maxcine Ham;  Surgeon: Conrad , MD;  Location: [facility] CATH LAB;  Service: Cardiovascular;  Laterality: N/A;  . Ercp N/A 08/25/2014    Procedure: ENDOSCOPIC RETROGRADE CHOLANGIOPANCREATOGRAPHY (ERCP);  Surgeon: Ladene Artist, MD;  Location: [facility] ENDOSCOPY;  Service: Endoscopy;  Laterality: N/A;  . 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 Above knee leg amputation Right     05/2012  . Cholecystectomy  10/25/2014    DR [name]  . Cholecystectomy N/A 10/25/2014    Procedure: LAPAROSCOPIC CHOLECYSTECTOMY ;  Surgeon: Coralie Keens, MD;  Location: [facility] OR;  Service: General;  Laterality: N/A;   Family History  Problem Relation Age of Onset  . Heart attack Mother 55  . Heart disease Mother   . Stroke Mother   . Hyperlipidemia Mother   . Hypertension Mother   . Colon cancer Neg Hx   . Prostate cancer Neg Hx   . COPD Father   . Peripheral vascular disease Father   . Diabetes Brother   . Heart disease Brother   . Hypertension Brother   . Heart attack Brother   . Diabetes Daughter    Social History  Substance Use Topics  . Smoking status: Former Smoker -- 20 years    Quit date: 07/22/2012  . Smokeless tobacco: Current User     Comment: pt states that he is using E-cigs  . Alcohol Use: No    Review of Systems  Constitutional:       Per HPI, otherwise negative  HENT:       Per HPI, otherwise negative  Respiratory:       Per HPI, otherwise negative  Cardiovascular:       Per HPI, otherwise negative  Gastrointestinal: Negative for vomiting.  Endocrine:       Negative aside from HPI   Genitourinary:       Neg aside from HPI   Musculoskeletal:       Per HPI, otherwise negative  Skin: Negative.   Allergic/Immunologic:       Diabetes, hypertension, neuropathy, vasculopathy  Neurological: Negative for syncope.      Allergies  Bee venom and Influenza vaccines  Home Medications   Prior to Admission medications   Medication Sig Start Date End Date Taking? Authorizing Provider  aspirin EC 325 MG  tablet Take 325 mg by mouth daily.    Historical Provider, MD  atorvastatin (LIPITOR) 40 MG tablet Take 1 tablet (40 mg total) by mouth daily. 04/24/15   Colon Branch, MD  baclofen (LIORESAL) 10 MG tablet TAKE 1/2 TABLET BY MOUTH TWICE A DAY 06/14/15   Pieter Partridge, DO  clindamycin (CLEOCIN) 150 MG capsule Take 1 capsule (150 mg total) by mouth every 6 (six) hours. Patient not taking: Reported on 06/27/2015 06/02/15   Konrad Felix, PA  diclofenac sodium (VOLTAREN) 1 % GEL Apply 1 application topically 2 (two) times daily as needed (pain). For pain 03/09/12   Charlett Blake, MD  ferrous sulfate 325 (65 FE) MG tablet Take 325 mg by mouth daily with breakfast.    Historical Provider, MD  glucagon (GLUCAGEN) 1 MG SOLR injection Inject 1 mg into the vein once as needed for low blood sugar.    Historical Provider, MD  insulin aspart protamine- aspart (NOVOLOG MIX 70/30) (70-30) 100 UNIT/ML injection Inject 0.05 mLs (5 Units total) into the skin 2 (two) times daily with a meal. 11/03/14   Belkys A Regalado, MD  lisinopril (PRINIVIL,ZESTRIL) 10 MG tablet Take 1 tablet (10 mg total) by mouth daily. 04/17/15   Colon Branch, MD  LYRICA 150 MG capsule TAKE ONE CAPSULE BY MOUTH TWICE A DAY 07/31/15   Pieter Partridge, DO  metoprolol succinate (TOPROL-XL) 25 MG 24 hr tablet Take 1 tablet (25 mg total) by mouth daily. 04/24/15   Colon Branch, MD  Multiple Vitamins-Minerals (MULTIVITAMIN WITH MINERALS) tablet Take 1 tablet by mouth daily.    Historical Provider, MD  mupirocin ointment (BACTROBAN) 2 % Apply  topically 2 (two) times daily as needed. 06/01/15   Colon Branch, MD  pantoprazole (PROTONIX) 40 MG tablet Take 1 tablet (40 mg total) by mouth daily with lunch. 08/04/15   Colon Branch, MD   BP 149/78 mmHg  Pulse 44  Temp(Src) 97.6 F (36.4 C) (Oral)  Resp 12  SpO2 97% Physical Exam  Constitutional: He is oriented to person, place, and time. He appears well-developed. No distress.  HENT:  Head: Normocephalic and atraumatic.  Eyes: Conjunctivae and EOM are normal.  Cardiovascular: Normal rate and regular rhythm.   Pulmonary/Chest: No stridor. He has no wheezes.  Abdominal: He exhibits no distension.  Genitourinary:  Foley catheter, actively draining, no recent complications  Musculoskeletal: He exhibits no edema.  Bilateral lower extremity above-the-knee amputation  Neurological: He is alert and oriented to person, place, and time.  Skin: Skin is warm and dry.  Psychiatric: He has a normal mood and affect.  Nursing note and vitals reviewed.   ED Course  Procedures (including critical care time) Labs Review Labs Reviewed  COMPREHENSIVE METABOLIC PANEL - Abnormal; Notable for the following:    Albumin 3.3 (*)    All other components within normal limits  BRAIN NATRIURETIC PEPTIDE - Abnormal; Notable for the following:    B Natriuretic Peptide 208.6 (*)    All other components within normal limits  CBC WITH DIFFERENTIAL/PLATELET - Abnormal; Notable for the following:    RDW 16.0 (*)    All other components within normal limits  TROPONIN I    Imaging Review Dg Chest 2 View  08/13/2015  CLINICAL DATA:  Cough for 2 weeks. EXAM: CHEST  2 VIEW COMPARISON:  11/08/2014 FINDINGS: The cardiac silhouette is enlarged. Mediastinal contours appear intact. There is no evidence of focal airspace consolidation, pleural effusion  or pneumothorax. Osseous structures are without acute abnormality. Soft tissues are grossly normal. IMPRESSION: No active cardiopulmonary disease. Electronically Signed    By: Fidela Salisbury M.D.   On: 08/13/2015 13:21   I have personally reviewed and evaluated these images and lab results as part of my medical decision-making.   EKG Interpretation   Date/Time:  Sunday August 13 2015 15:18:38 EST Ventricular Rate:  45 PR Interval:  282 QRS Duration: 135 QT Interval:  512 QTC Calculation: 443 R Axis:   -131 Text Interpretation:  Sinus or ectopic atrial bradycardia Ventricular  premature complex Prolonged PR interval Nonspecific intraventricular  conduction delay Inferior infarct, old Probable lateral infarct, age  indeterminate Anterior infarct, old Sinus bradycardia Premature  ventricular complexes Non-specific intra-ventricular conduction delay T  wave abnormality Abnormal ekg Confirmed by Carmin Muskrat  MD [phone]) on  08/13/2015 3:26:23 PM      ECHO 08/19/2014 Study Conclusions  - Left ventricle: The cavity size was normal. Systolic function was   moderately reduced. The estimated ejection fraction was in the   range of 35% to 40%. Diffuse hypokinesis. Doppler parameters are   consistent with abnormal left ventricular relaxation (grade 1   diastolic dysfunction). - Aortic valve: Trileaflet; mildly thickened, mildly calcified   leaflets. There was no regurgitation. - Aortic root: The aortic root was normal in size. - Ascending aorta: The ascending aorta was normal in size. - Mitral valve: Structurally normal valve. There was no   regurgitation. - Left atrium: The atrium was normal in size. - Right atrium: The atrium was normal in size. - Tricuspid valve: There was no regurgitation. - Pulmonic valve: There was no regurgitation. - Pulmonary arteries: Systolic pressure was within the normal   range. - Inferior vena cava: The vessel was normal in size. - Pericardium, extracardiac: There was no pericardial effusion.   6:22 PM Patient in similar condition, heart rate 40, sinus, with inconsistent rhythm.   MDM  Patient presents with  cough, congestion, fatigue. Notably, the patient has multiple medical issues, including diabetes, hypertension, though he is unclear on his history of congestive heart failure, last echocardiogram was one year ago, with hypokinesis, ejection fraction 35/40%. Here, the patient is awake, alert, but bradycardic, and with his fatigue, dyspnea, he required admission for further evaluation, management of likely congestive heart failure exacerbation, but with concern for bradycardia as well.   Carmin Muskrat, MD 08/13/15 [phone]

## 2015-08-13 NOTE — ED Notes (Signed)
Pt c/o cough and congestion x 2 weeks. Pt has tried over the counter cough medication without relief.

## 2015-08-14 ENCOUNTER — Other Ambulatory Visit: Payer: Self-pay | Admitting: *Deleted

## 2015-08-14 ENCOUNTER — Observation Stay (HOSPITAL_BASED_OUTPATIENT_CLINIC_OR_DEPARTMENT_OTHER): Payer: Medicare Other

## 2015-08-14 DIAGNOSIS — I5022 Chronic systolic (congestive) heart failure: Secondary | ICD-10-CM

## 2015-08-14 DIAGNOSIS — I509 Heart failure, unspecified: Secondary | ICD-10-CM | POA: Diagnosis not present

## 2015-08-14 DIAGNOSIS — J40 Bronchitis, not specified as acute or chronic: Secondary | ICD-10-CM | POA: Diagnosis not present

## 2015-08-14 DIAGNOSIS — R001 Bradycardia, unspecified: Secondary | ICD-10-CM

## 2015-08-14 DIAGNOSIS — I5042 Chronic combined systolic (congestive) and diastolic (congestive) heart failure: Secondary | ICD-10-CM

## 2015-08-14 DIAGNOSIS — I255 Ischemic cardiomyopathy: Secondary | ICD-10-CM

## 2015-08-14 LAB — BASIC METABOLIC PANEL
ANION GAP: 13 (ref 5–15)
BUN: 18 mg/dL (ref 6–20)
CALCIUM: 8.9 mg/dL (ref 8.9–10.3)
CO2: 23 mmol/L (ref 22–32)
Chloride: 105 mmol/L (ref 101–111)
Creatinine, Ser: 0.59 mg/dL — ABNORMAL LOW (ref 0.61–1.24)
GFR calc Af Amer: >60 mL/min (ref >60)
GLUCOSE: 165 mg/dL — AB (ref 65–99)
POTASSIUM: 3.8 mmol/L (ref 3.5–5.1)
SODIUM: 141 mmol/L (ref 135–145)

## 2015-08-14 LAB — GLUCOSE, CAPILLARY
Glucose-Capillary: 155 mg/dL — ABNORMAL HIGH (ref 65–99)
Glucose-Capillary: 130 mg/dL — ABNORMAL HIGH (ref 65–99)

## 2015-08-14 LAB — TROPONIN I

## 2015-08-14 MED ORDER — AZITHROMYCIN 250 MG PO TABS: ORAL_TABLET | ORAL | Status: DC

## 2015-08-14 MED ORDER — INSULIN ASPART PROT & ASPART (70-30 MIX) 100 UNIT/ML ~~LOC~~ SUSP
25.0000 [IU] | Freq: Every day | SUBCUTANEOUS | Status: DC
Start: 2015-08-14 — End: 2015-08-14
  Administered 2015-08-14: 25 [IU] via SUBCUTANEOUS
  Filled 2015-08-14: qty 10

## 2015-08-14 NOTE — Care Management Note (Signed)
Case Management Note  Patient Details  Name: Jaime [name] MRN: [id number] Date of Birth: 12-Nov-1940  Subjective/Objective:                  Date- 08-14-15 Initial Assessment Spoke with patient at the bedside along with wife.  Introduced self as Tourist information centre manager and explained role in discharge planning and how to be reached.  Verified patient lives in  [place] in [place] with wife.  Verified patient anticipates to go home with wife at time of discharge and will have full time supervision by wife and HHA at this time to best of their knowledge.  Patient has DME electric wheelchair from home at bedside. Wife and patient expressed potential need for no other DME.  Patient denied needing help with their medication.  Patient is driven by wife to MD appointments.  Verified patient has PCP [name]. Patient states they currently receive [facility] services private [facility], and is followed by Case Management with [facility]   Admission Comments: Patient in observtaion for dyspnea.   Carles Collet RN BSN CM [phone]   Action/Plan:  Anticipate DC to home today, no CM needs identified.  Expected Discharge Date:                  Expected Discharge Plan:  Home/Self Care  In-House Referral:     Discharge planning Services  CM Consult  Post Acute Care Choice:    Choice offered to:     DME Arranged:    DME Agency:     HH Arranged:    [place] Agency:     Status of Service:  Completed, signed off  Medicare Important Message Given:    Date Medicare IM Given:    Medicare IM give by:    Date Additional Medicare IM Given:    Additional Medicare Important Message give by:     If discussed at [place] of Stay Meetings, dates discussed:    Additional Comments:  Carles Collet, RN 08/14/2015, 10:59 AM

## 2015-08-14 NOTE — Telephone Encounter (Signed)
Currently admitted to hospital as of 08/13/2015.

## 2015-08-14 NOTE — Telephone Encounter (Signed)
Was admitted to hospital and released home. Needs a hospital follow-up

## 2015-08-14 NOTE — Progress Notes (Signed)
  Echocardiogram 2D Echocardiogram has been performed.  Jennette Dubin 08/14/2015, 12:08 PM

## 2015-08-14 NOTE — Discharge Summary (Signed)
Physician Discharge Summary  Jaime [name] [id number] DOB: [birth year] DOA: 08/13/2015  PCP: [name], MD  Admit date: 08/13/2015 Discharge date: 08/14/2015  Recommendations for Outpatient Follow-up:  1. Pt will need to follow up with PCP in 2 weeks post discharge 2. 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 -The patient was asymptomatic -Heart rate improved with discontinuation of the patient's beta blocker into the upper 50s and low 60s -The patient will need to follow up with his primary care provider and cardiologist after discontinuation of his beta blocker, metoprolol succinate Diabetes mellitus type 2 -Patient will resume his home regimen -He was placed on NovoLog sliding scale during the hospitalization -Last hemoglobin A1c 11/02/2014 6.6 Hypertension  -Well-controlled  -Monitor off metoprolol succinate  -Continue lisinopril   coronary artery disease with history of stent -No anginal symptoms -Continue aspirin, statin Secondary progressive multiple sclerosis -Follow up with his neurologist, Dr. [name] -continue baclofen and lyrica Discharge Condition: Stable  Disposition:  home  Diet: Carbohydrate modified Wt Readings from Last 3 Encounters:  08/13/15 63.7 kg (140 lb 6.9 oz)  11/02/14 63.8 kg (140 lb 10.5 oz)  10/25/14 66.225 kg (146 lb)    History of present illness:  75 year old male with a history of systolic and diastolic CHF, secondary progressive multiple sclerosis, previous tobacco abuse, diabetes mellitus, hypertension, peripheral  vascular disease status post bilateral AKA, coronary artery disease presented with 2 weeks of chest congestion and nonproductive cough. The patient denied any fevers, chills, worsening shortness of breath, nausea, vomiting, diarrhea, abdominal pain. Chest x-ray did not reveal any acute abnormalities. The patient was noted to be bradycardic into the low 40s. As result, the patient was admitted for observation. The patient remained afebrile and hemodynamically stable. His beta blocker was held. The patient's heart rate improved into the upper 50s and low 60s. The patient remained clinically asymptomatic. There was no leukocytosis, and electrolytes were optimized. Troponins were negative 2. EKG was sinus bradycardia with first-degree AV block. There was no concerning ST-T wave changes. The patient endorses low-grade temperature at home, but remained afebrile during hospitalization. He was started on by mouth doxycycline. The patient was distracted to follow up with his primary care provider and cardiologist.    Discharge Exam: Filed Vitals:   08/13/15 2142 08/14/15 0520  BP: 119/60 111/57  Pulse: 50 58  Temp: 98.3 F (36.8 C) 98 F (36.7 C)  Resp: 18 18   Filed Vitals:   08/13/15 1845 08/13/15 1930 08/13/15 2142 08/14/15 0520  BP: 127/77 147/97 119/60 111/57  Pulse:  82 50 58  Temp:   98.3 F (36.8 C) 98 F (36.7 C)  TempSrc:   Oral Oral  Resp: 12 14 18 18   Weight:   63.7 kg (140 lb 6.9 oz)   SpO2: 97% 95% 97% 96%   General: A&O x 3, NAD, pleasant, cooperative Cardiovascular: RRR, no rub, no gallop, no S3 Respiratory: CTAB, no wheeze, no rhonchi Abdomen:soft, nontender, nondistended, positive bowel sounds Extremities: No edema, No lymphangitis, no petechiae  Discharge Instructions      Discharge Instructions    Ambulatory referral to Neurology  Complete by:  As directed   Refer to [facility]     Diet - low sodium heart healthy    Complete by:  As directed       Increase activity slowly    Complete by:  As directed             Medication List    STOP taking these medications        clindamycin 150 MG capsule  Commonly known as:  CLEOCIN     metoprolol succinate 25 MG 24 hr tablet  Commonly known as:  TOPROL-XL      TAKE these medications        aspirin EC 325 MG tablet  Take 325 mg by mouth daily.     atorvastatin 40 MG tablet  Commonly known as:  LIPITOR  Take 1 tablet (40 mg total) by mouth daily.     azithromycin 250 MG tablet  Commonly known as:  ZITHROMAX Z-PAK  Take 2 tablets on 08/14/15, then one tablet daily starting 08/15/15     baclofen 10 MG tablet  Commonly known as:  LIORESAL  TAKE 1/2 TABLET BY MOUTH TWICE A DAY     diclofenac sodium 1 % Gel  Commonly known as:  VOLTAREN  Apply 1 application topically 2 (two) times daily as needed (pain). For pain     ferrous sulfate 325 (65 FE) MG tablet  Take 325 mg by mouth daily with breakfast.     GLUCAGEN 1 MG Solr injection  Generic drug:  glucagon  Inject 1 mg into the vein once as needed for low blood sugar.     guaiFENesin-dextromethorphan 100-10 MG/5ML syrup  Commonly known as:  ROBITUSSIN DM  Take 15 mLs by mouth every 4 (four) hours as needed for cough.     insulin aspart protamine- aspart (70-30) 100 UNIT/ML injection  Commonly known as:  NOVOLOG MIX 70/30  Inject 0.05 mLs (5 Units total) into the skin 2 (two) times daily with a meal.     lisinopril 10 MG tablet  Commonly known as:  PRINIVIL,ZESTRIL  Take 1 tablet (10 mg total) by mouth daily.     LYRICA 150 MG capsule  Generic drug:  pregabalin  TAKE ONE CAPSULE BY MOUTH TWICE A DAY     multivitamin with minerals tablet  Take 1 tablet by mouth daily.     mupirocin ointment 2 %  Commonly known as:  BACTROBAN  Apply topically 2 (two) times daily as needed.     pantoprazole 40 MG tablet  Commonly known as:  PROTONIX  Take 1 tablet (40 mg total) by mouth daily with lunch.     Vitamin D 2000 units  Caps  Take 1 capsule by mouth daily.         The results of significant diagnostics from this hospitalization (including imaging, microbiology, ancillary and laboratory) are listed below for reference.    Significant Diagnostic Studies: Dg Chest 2 View  08/13/2015  CLINICAL DATA:  Cough for 2 weeks. EXAM: CHEST  2 VIEW COMPARISON:  11/08/2014 FINDINGS: The cardiac silhouette is enlarged. Mediastinal contours appear intact. There is no evidence of focal airspace consolidation, pleural effusion or pneumothorax. Osseous structures are without acute abnormality. Soft tissues are grossly normal. IMPRESSION: No active cardiopulmonary disease. Electronically Signed   By: Fidela Salisbury M.D.   On: 08/13/2015 13:21     Microbiology: No results found for this or any previous visit (from the past 240 hour(s)).  Labs: Basic Metabolic Panel:  Recent Labs Lab 08/13/15 1630 08/14/15 0428  NA 143 141  K 4.2 3.8  CL 110 105  CO2 27 23  GLUCOSE 76 165*  BUN 14 18  CREATININE 0.64 0.59*  CALCIUM 9.3 8.9   Liver Function Tests:  Recent Labs Lab 08/13/15 1630  AST 30  ALT 31  ALKPHOS 51  BILITOT 1.0  PROT 7.1  ALBUMIN 3.3*   No results for input(s): LIPASE, AMYLASE in the last 168 hours. No results for input(s): AMMONIA in the last 168 hours. CBC:  Recent Labs Lab 08/13/15 1630 08/13/15 2109  WBC 6.9 8.3  NEUTROABS 4.3  --   HGB 13.9 15.4  HCT 43.1 44.7  MCV 81.5 79.5  PLT 160 178   Cardiac Enzymes:  Recent Labs Lab 08/13/15 1630 08/14/15 0428  TROPONINI <0.03 <0.03   BNP: Invalid input(s): POCBNP CBG:  Recent Labs Lab 08/13/15 2140 08/14/15 0741  GLUCAP 172* 155*    Time coordinating discharge:  Greater than 30 minutes  Signed:  Chaquita Basques, DO [facility] Pager: [phone] 08/14/2015, 7:50 AM

## 2015-08-14 NOTE — Progress Notes (Signed)
Hendricks Limes Grether to be D/C'd Home per MD order.  Discussed with the patient and all questions fully answered.  VSS, Skin clean, dry and intact without evidence of skin break down, no evidence of skin tears noted. IV catheter discontinued intact. Site without signs and symptoms of complications. Dressing and pressure applied.  An After Visit Summary was printed and given to the patient. Patient received prescription.  D/c education completed with patient/family including follow up instructions, medication list, d/c activities limitations if indicated, with other d/c instructions as indicated by MD - patient able to verbalize understanding, all questions fully answered.   Patient instructed to return to ED, call 911, or call MD for any changes in condition.   Patient escorted via [place], and D/C home via private auto.  Malcolm Metro 08/14/2015 1:27 PM Dwaine Gale to be D/C'd Home per MD order.  Discussed with the patient and all questions fully answered.  VSS, Skin clean, dry and intact without evidence of skin break down, no evidence of skin tears noted. IV catheter discontinued intact. Site without signs and symptoms of complications. Dressing and pressure applied.  An After Visit Summary was printed and given to the patient. Patient received prescription.  D/c education completed with patient/family including follow up instructions, medication list, d/c activities limitations if indicated, with other d/c instructions as indicated by MD - patient able to verbalize understanding, all questions fully answered.   Patient instructed to return to ED, call 911, or call MD for any changes in condition.   Patient escorted via [place], and D/C home via private auto.  Malcolm Metro 08/14/2015 1:27 PM

## 2015-08-15 ENCOUNTER — Other Ambulatory Visit: Payer: Self-pay | Admitting: *Deleted

## 2015-08-15 NOTE — Telephone Encounter (Signed)
PCP: [name], MD  Admit date: 08/13/2015 Discharge date: 08/14/2015  Recommendations for Outpatient Follow-up:  1. Pt will need to follow up with PCP in 2 weeks post discharge 2. 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 -The patient was asymptomatic -Heart rate improved with discontinuation of the patient's beta blocker into the upper 50s and low 60s -The patient will need to follow up with his primary care provider and cardiologist after discontinuation of his beta blocker, metoprolol succinate Diabetes mellitus type 2 -Patient will resume his home regimen -He was placed on NovoLog sliding scale during the hospitalization -Last hemoglobin A1c 11/02/2014 6.6 Hypertension  -Well-controlled  -Monitor off metoprolol succinate  -Continue lisinopril  coronary artery disease with history of stent -No anginal symptoms -Continue aspirin, statin Secondary progressive multiple sclerosis -Follow up with his neurologist, Dr. [name] -continue baclofen and lyrica Discharge Condition: Stable  Disposition:  home  Diet: Carbohydrate modified   Called to complete TCM call.  Attempt #1-unsuccessful.  Left a voice message for call back.

## 2015-08-16 ENCOUNTER — Ambulatory Visit: Payer: Medicare Other | Admitting: Neurology

## 2015-08-16 ENCOUNTER — Telehealth: Payer: Self-pay | Admitting: Behavioral Health

## 2015-08-16 NOTE — Telephone Encounter (Signed)
Transition Care Management Follow-up Telephone Call  PCP: [name], MD  Admit date: 08/13/2015 Discharge date: 08/14/2015  Recommendations for Outpatient Follow-up:  1. Pt will need to follow up with PCP in 2 weeks post discharge 2. 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 -The patient was asymptomatic -Heart rate improved with discontinuation of the patient's beta blocker into the upper 50s and low 60s -The patient will need to follow up with his primary care provider and cardiologist after discontinuation of his beta blocker, metoprolol succinate Diabetes mellitus type 2 -Patient will resume his home regimen -He was placed on NovoLog sliding scale during the hospitalization -Last hemoglobin A1c 11/02/2014 6.6 Hypertension  -Well-controlled  -Monitor off metoprolol succinate  -Continue lisinopril  coronary artery disease with history of stent -No anginal symptoms -Continue aspirin, statin Secondary progressive multiple sclerosis -Follow up with his neurologist, Dr. [name] -continue baclofen and lyrica Discharge Condition: Stable    How have you been since you were released from the hospital? Per the patient's wife, "he's feeling a lot better and the Z-pak seems to be working really well.   Do you understand why you were in the hospital? yes   Do you understand the discharge instructions? yes   Where were you discharged to? Home   Items Reviewed:  Medications reviewed: yes  Allergies reviewed: yes  Dietary changes reviewed: yes, low  sodium diet  Referrals reviewed: yes   Functional Questionnaire:   Activities of Daily Living (ADLs):   He states they are independent in the following: feeding States they require assistance with the following: ambulation, bathing and hygiene, continence, grooming, toileting and dressing   Any transportation issues/concerns?: yes, patient's wife reported that the utility [employer] they normally use for transporting is currently in a Dealer shop and probably will not be fixed until another week.   Any patient concerns? yes, per the patient's wife outside of his recent hospitalization his dementia seems to be getting worse.   Confirmed importance and date/time of follow-up visits scheduled yes, 08/28/15 at 11:30 AM  Provider Appointment booked with Dr. Larose Kells.  Confirmed with patient if condition begins to worsen call PCP or go to the ER.  Patient was given the office number and encouraged to call back with question or concerns.  : yes

## 2015-08-17 ENCOUNTER — Other Ambulatory Visit: Payer: Self-pay | Admitting: *Deleted

## 2015-08-17 NOTE — Telephone Encounter (Signed)
Left a message for call back.  

## 2015-08-21 ENCOUNTER — Other Ambulatory Visit: Payer: Self-pay | Admitting: *Deleted

## 2015-08-22 NOTE — Patient Outreach (Signed)
I was finally able to reach Mrs. Fetchko, pt wife and caregiver. She answered all my questions for the transition of care call and we made an appointment for me to make an initial home visit on Thursday, Feb 2nd at 1:00.  Tykeem Zaya [facility] [place] [phone]

## 2015-08-22 NOTE — Telephone Encounter (Signed)
TCM call completed by Idelle Crouch, RN.

## 2015-08-24 ENCOUNTER — Other Ambulatory Visit: Payer: Self-pay | Admitting: *Deleted

## 2015-08-24 ENCOUNTER — Encounter: Payer: Self-pay | Admitting: *Deleted

## 2015-08-24 NOTE — Patient Outreach (Signed)
S:  Home visit after hospitalization for acute bronchitis. Today he is feeling better. It is the first day he has felt like he has turned the corner and is improving. Denies cough at this time, SOB. He also denies pain at present.   O:  BP 150/80 mmHg  Pulse 60  Resp 18  SpO2 98% NFBS 84       RRR       Lungs clear       Abdomen - BS present all quads, no tenderness       Condom cath intact, urine is light yellow.       Skin intact, however buttocks are darker and wife is concerned about pressure.       Bilateral AKA  A:   Resolved bronchitis         Impaired mobility - MS        Chronic pain        Diabetes        CHF        Vitamin D deficiency                  (Pt is receiving excellent care from his wife and paid caregiver, very impressive)  P:   Wife giving pt 200 IU of Vitamin D as she understood the dose to be. Reviewed labs and Dr. Georgie Chard notes which         indicate pt should be taking 2000 IU            I will follow pt regularly now and relieve my associate, Loni Muse.          I can draw labs in pt home and save them a difficult transport and see him prn when they feel he needs to be          Assessed.          Provided pt education folders for CHF, Diabetes.          I will call pt weekly for transition of care calls.                    Gave HCPOA and Living Will documents. Find out if there is a notary that will go to a pt. Home. Provided CHF and Diabetes educational folders for refreshment of disease management.

## 2015-08-28 ENCOUNTER — Ambulatory Visit (INDEPENDENT_AMBULATORY_CARE_PROVIDER_SITE_OTHER): Payer: Medicare Other | Admitting: Internal Medicine

## 2015-08-28 ENCOUNTER — Encounter: Payer: Self-pay | Admitting: Internal Medicine

## 2015-08-28 VITALS — BP 126/74 | HR 86 | Temp 98.1°F | Wt 140.0 lb

## 2015-08-28 DIAGNOSIS — I10 Essential (primary) hypertension: Secondary | ICD-10-CM

## 2015-08-28 DIAGNOSIS — J209 Acute bronchitis, unspecified: Secondary | ICD-10-CM

## 2015-08-28 MED ORDER — AZELASTINE HCL 0.1 % NA SOLN: 2.0000 | Freq: Every evening | NASAL | Status: DC | PRN

## 2015-08-28 NOTE — Progress Notes (Signed)
Pre visit review using our clinic review tool, if applicable. No additional management support is needed unless otherwise documented below in the visit note. 

## 2015-08-28 NOTE — Patient Instructions (Signed)
Do the  Incentive spirometry 2 or 3 times a day  For cough:  Take Mucinex DM twice a day as needed until better  For nasal congestion: Use OTC Nasocort or Flonase : 2 nasal sprays on each side of the nose in the morning until you feel better Use ASTELIN a prescribed spray : 2 nasal sprays on each side of the nose at night until you feel better  Call if not back to normal in 2-3  weeks  -----------------------------------  GO TO THE FRONT DESK   Schedule a routine office visit or check up to be done in  3-4 months  No fasting   Front desk:    30  Check the  blood pressure 2 or 3 times a  week  Be sure your blood pressure is between 110/65 and  145/85. If it is consistently higher or lower, let me know ALSO CHECK YOUR HEART RATE, CALL IF MORE THAN 75 CONSISTENTLY

## 2015-08-28 NOTE — Progress Notes (Signed)
Subjective:    Patient ID: Jaime [name], male    DOB: [birth year], 75 y.o.   MRN: [id number]  DOS:  08/28/2015 Type of visit - description : Hospital follow-up, here with his wife Admitted 08/13/2015 for a day. DX: Bronchitis, sent home on Zithromax, chest x-ray no pneumonia Was noted to be bradycardic beta blockers discontinued, heart rate remained in 10 days 50s to 60s. Other chronic medical problems were not issue during the admission Labs reviewed: BMP, CBC , UA: Satisfactory  Review of Systems  Since he left the hospital reports good compliance of medication. Chest congestion and cough have definitely decreased. Appetite is normal, denies fever chills. Sinus pain or congestion but reports postnasal dripping. Very mild sputum production without hemoptysis. Does not smoke but sometimes Vapes. Past Medical History  Diagnosis Date  . Hyperlipidemia   . Cardiomyopathy, ischemic     EF 45% per ECHO 2008  //   EF 25%, echo, August, 2013 //  Echo (8/15):  Mild LVH, EF 30-35%, ant-lat and lat AK, inf HK, Gr 1 DD, mild MR, mild LAE  . Pulmonary hypertension ([place])     moderate ECHO Jan 2008  . Systolic heart failure   . Multiple sclerosis ([place]) [place] -- LAST VISIT 11-20-2010  NOTE W/ CHART  . Lung nodule     resolved 11-2006 CT Chest  . Tobacco abuse     quit   . Increased prostate specific antigen (PSA) velocity   . Urinary retention     dx ~ 2-12, like from [place], now with a catheter, saw urology  . Carotid artery disease ([place])     a.  Doppler, February, 2012, 0-39% bilateral,Mild smooth plaque;  b.  Carotid US (8/15):  Bilateral 1-39% ICA >>> F/u 2 years  . Chronic indwelling Foley catheter   . Scoliosis associated with other condition   . Fatigue SEVERE  . Impotence   . Hypertension   . H/O pleural effusion 2008    POST THORACENTESIS  . History of colon polyps PRECANCEROUS  . Insomnia   . CAD (coronary artery disease) CARDIOLOGIST- DR KATZ--  VISIT 06-05-2011 IN EPIC    non-STEMI, 2007.Marland Kitchenoccluded circumflex.. Taxus stent placed...residual 80% LAD...50% RCA  . Urinary tract infection     hx of  . Atherosclerotic PVD with ulceration ([place])     left foot  . PAC (premature atrial contraction)     December, 2013  . Diabetes mellitus     INSULIN-DEPENDent  . Dementia   . Constipation   . TIA (transient ischemic attack)   . Sleep apnea     Past Surgical History  Procedure Laterality Date  . Hernia repair  1990    (R)  . Thoracentesis  2008    PLEURAL EFFUSION  . Cystoscopy  07/30/2011    Procedure: CYSTOSCOPY;  Surgeon: Hanley Ben, MD;  Location: [facility];  Service: Urology;  Laterality: N/A;  . Transurethral resection of prostate  07/30/2011    Procedure: TRANSURETHRAL RESECTION OF THE PROSTATE (TURP);  Surgeon: Hanley Ben, MD;  Location: [facility];  Service: Urology;  Laterality: N/A;  . Femoral-tibial bypass graft  03/11/2012    Procedure: BYPASS GRAFT FEMORAL-TIBIAL ARTERY;  Surgeon: Conrad Douds, MD;  Location: [place];  Service: Vascular;  Laterality: Left;  Left Femoral -Tibial trunk bypass, Endarterectomy of Tibial- Peroneal trunk with vein angioplasty.  . Intraoperative arteriogram  03/11/2012  Procedure: INTRA OPERATIVE ARTERIOGRAM;  Surgeon: Conrad Upland, MD;  Location: [place];  Service: Vascular;  Laterality: Left;  . Femoral-popliteal bypass graft  03/11/2012    Procedure: BYPASS GRAFT FEMORAL-POPLITEAL ARTERY;  Surgeon: Conrad Walsenburg, MD;  Location: [place];  Service: Vascular;  Laterality: Left;  embolectomy left lower leg  . Amputation  03/17/2012    Procedure: AMPUTATION ABOVE KNEE;  Surgeon: Conrad [place] City, MD;  Location: [place];  Service: Vascular;  Laterality: Left;  . Coronary angioplasty with stent placement  05-01-2006    OCCLUDED CIRCUMFLEX -- TAXUS STENT PLACMENT  AND RESIDUAL 80% LAD,  50% RCA  . Amputation  06/03/2012    Procedure: AMPUTATION ABOVE KNEE;  Surgeon: Conrad Thawville, MD;  Location: [place];  Service: Vascular;  Laterality: Right;  . Lower extremity angiogram Bilateral 12/17/2011    Procedure: LOWER EXTREMITY ANGIOGRAM;  Surgeon: Serafina Mitchell, MD;  Location: [facility] CATH LAB;  Service: Cardiovascular;  Laterality: Bilateral;  bil lower extrem angio  . Abdominal angiogram  12/17/2011    Procedure: ABDOMINAL ANGIOGRAM;  Surgeon: Serafina Mitchell, MD;  Location: [facility] CATH LAB;  Service: Cardiovascular;;  . Abdominal aortagram N/A 08/19/2013    Procedure: ABDOMINAL Maxcine Ham;  Surgeon: Conrad , MD;  Location: [facility] CATH LAB;  Service: Cardiovascular;  Laterality: N/A;  . Ercp N/A 08/25/2014    Procedure: ENDOSCOPIC RETROGRADE CHOLANGIOPANCREATOGRAPHY (ERCP);  Surgeon: Ladene Artist, MD;  Location: [facility] ENDOSCOPY;  Service: Endoscopy;  Laterality: N/A;  . 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 Above knee leg amputation Right     05/2012  . Cholecystectomy  10/25/2014    DR [name]  . Cholecystectomy N/A 10/25/2014    Procedure: LAPAROSCOPIC CHOLECYSTECTOMY ;  Surgeon: Coralie Keens, MD;  Location: [place];  Service: General;  Laterality: N/A;    Social History   Social History  . Marital Status: Married    Spouse Name: N/A  . Number of Children: 2  . Years of Education: N/A   Occupational History  . disable    Social History Main Topics  . Smoking status: Former Smoker -- 22 years    Quit date: 07/22/2012  . Smokeless tobacco: Current User     Comment: pt states that he is using E-cigs  . Alcohol Use: No  . Drug Use: No  . Sexual Activity: No     Comment: electronic cigarettes no nicotene   Other Topics Concern  . Not on file   Social History Narrative   Lives w/ wife        Medication List       This list is accurate as of: 08/28/15  7:17 PM.  Always use your most recent med list.               aspirin EC 325 MG tablet  Take 325 mg by mouth daily.     atorvastatin 40 MG tablet  Commonly known as:  LIPITOR  Take 1 tablet (40  mg total) by mouth daily.     azelastine 0.1 % nasal spray  Commonly known as:  ASTELIN  Place 2 sprays into both nostrils at bedtime as needed for rhinitis. Use in each nostril as directed     baclofen 10 MG tablet  Commonly known as:  LIORESAL  TAKE 1/2 TABLET BY MOUTH TWICE A DAY     diclofenac sodium 1 % Gel  Commonly known as:  VOLTAREN  Apply 1 application topically  2 (two) times daily as needed (pain). For pain     ferrous sulfate 325 (65 FE) MG tablet  Take 325 mg by mouth daily with breakfast.     GLUCAGEN 1 MG Solr injection  Generic drug:  glucagon  Inject 1 mg into the vein once as needed for low blood sugar. Reported on 08/28/2015     guaiFENesin-dextromethorphan 100-10 MG/5ML syrup  Commonly known as:  ROBITUSSIN DM  Take 15 mLs by mouth every 4 (four) hours as needed for cough.     insulin aspart protamine- aspart (70-30) 100 UNIT/ML injection  Commonly known as:  NOVOLOG MIX 70/30  Inject 0.05 mLs (5 Units total) into the skin 2 (two) times daily with a meal.     lisinopril 10 MG tablet  Commonly known as:  PRINIVIL,ZESTRIL  Take 1 tablet (10 mg total) by mouth daily.     LYRICA 150 MG capsule  Generic drug:  pregabalin  TAKE ONE CAPSULE BY MOUTH TWICE A DAY     multivitamin with minerals tablet  Take 1 tablet by mouth daily.     mupirocin ointment 2 %  Commonly known as:  BACTROBAN  Apply topically 2 (two) times daily as needed.     pantoprazole 40 MG tablet  Commonly known as:  PROTONIX  Take 1 tablet (40 mg total) by mouth daily with lunch.     Vitamin D 2000 units Caps  Take 1 capsule by mouth daily.           Objective:   Physical Exam BP 126/74 mmHg  Pulse 86  Temp(Src) 98.1 F (36.7 C) (Oral)  Ht   Wt 140 lb (63.504 kg)  SpO2 97% General:   Well developed, well nourished . NAD.  HEENT:  Normocephalic . Face symmetric, atraumatic. Nose not congested. Sinuses not TTP Lungs:  Decreased breath sounds, poor  respiratory effort, no  intercostal retractions, no accessory muscle use. Heart: RRR,  no murmur. Rate around 60   Skin: Not pale. Not jaundice Neurologic:  alert & oriented X3.  Speech normal Psych--  Cognition and judgment appear intact.  Cooperative with normal attention span and concentration.  Behavior appropriate. No anxious or depressed appearing.      Assessment & Plan:   Assessment Diabetes, follow-up by Dr. Chalmers Cater --Multiple complications: amputations, CAD, PVD  HTN Hyperlipidemia CV: --Cardiomyopathy, ischemic, not candidate for ICD --CHF --Carotid artery disease --Peripheral vascular disease --Pulmonary hypertension Multiple sclerosis, progressive --Urinary retention Phantom limb pain  Plan : Bronchitis: Chest congestion and cough and improving. still complaining of postnasal dripping. Plan: Incentive spirometry, Flonase, Astelin, Mucinex. Call if not improving. HTN, cardiomyopathy: Off beta blockers, upper arrival pulse was 86, I recheck after rest and it was  60. Consider restart beta blockers. See instructions RTC 4 months

## 2015-08-30 DIAGNOSIS — E1165 Type 2 diabetes mellitus with hyperglycemia: Secondary | ICD-10-CM | POA: Diagnosis not present

## 2015-08-30 DIAGNOSIS — I10 Essential (primary) hypertension: Secondary | ICD-10-CM | POA: Diagnosis not present

## 2015-08-30 DIAGNOSIS — E7800 Pure hypercholesterolemia, unspecified: Secondary | ICD-10-CM | POA: Diagnosis not present

## 2015-08-31 ENCOUNTER — Encounter: Payer: Self-pay | Admitting: Neurology

## 2015-08-31 ENCOUNTER — Ambulatory Visit: Payer: Self-pay | Admitting: *Deleted

## 2015-09-01 ENCOUNTER — Ambulatory Visit: Payer: Self-pay | Admitting: *Deleted

## 2015-09-05 ENCOUNTER — Other Ambulatory Visit: Payer: Self-pay

## 2015-09-08 ENCOUNTER — Other Ambulatory Visit: Payer: Self-pay | Admitting: *Deleted

## 2015-09-08 NOTE — Patient Outreach (Signed)
Called home number and was able to leave a message and requested a return call at their convenience.  Michaelanthony Raysor [facility] [place] [phone]

## 2015-09-08 NOTE — Patient Outreach (Signed)
THN Transition of care call #[phone] ( I did call pt last Friday and left a message, I did not document this but pt wife can verify that I did call and leave a message).  Called today and was unable to contact pt or wife and phone did not allow to leave a message.  I will keep trying to contact them.  Jaime [name] [facility] [place] [phone]

## 2015-09-15 ENCOUNTER — Other Ambulatory Visit: Payer: Self-pay | Admitting: *Deleted

## 2015-09-15 NOTE — Patient Outreach (Signed)
Mrs. Baity called me yesterday and apologized she had not returned my call. She reports Mr. Heater is doing better. He has gone to see his primary care provider and got a good report. She does not have any new concerns. Pt's bronchitis is resolved. He will be having a colonoscopy soon. I them again next week and see Mr. Cavell the next week, will request a visit on Wednesday, March 8th.  Soma Erichsen [facility] [place] [phone]

## 2015-09-22 ENCOUNTER — Ambulatory Visit: Payer: Self-pay | Admitting: *Deleted

## 2015-09-27 ENCOUNTER — Encounter: Payer: Self-pay | Admitting: Gastroenterology

## 2015-09-27 ENCOUNTER — Ambulatory Visit (INDEPENDENT_AMBULATORY_CARE_PROVIDER_SITE_OTHER): Payer: Medicare Other | Admitting: Gastroenterology

## 2015-09-27 VITALS — BP 108/68 | HR 72

## 2015-09-27 DIAGNOSIS — Z86010 Personal history of colonic polyps: Secondary | ICD-10-CM | POA: Diagnosis not present

## 2015-09-27 NOTE — Patient Instructions (Signed)
Cologuard stool testing, if this is positive then we will proceed with colonoscopy.

## 2015-09-27 NOTE — Progress Notes (Signed)
Review of pertinent gastrointestinal problems: 1. Adenomatous colon polyps:  Colonoscopy for routine screening 2007 Dr. Ardis Hughs found 8 small polyps. Pathology showed one of these was adenomatous the rest were hyperplastic or simple benign colon mucosa. Repeat colonoscopy 2011 Dr. Ardis Hughs found 1 subcentimeter polyp it was removed sent to pathology however the past specimen was read as vegetable material.  HPI: This is a  very pleasant 74 year old man whom I last saw several years ago  Chief complaint is history of cancerous polyps  He is wheelchair bound, he has a chronic indwelling urinary catheter  He has had no changes in his bowels specifically no bleeding.   Review of systems: Pertinent positive and negative review of systems were noted in the above HPI section. Complete review of systems was performed and was otherwise normal.   Past Medical History  Diagnosis Date  . Hyperlipidemia   . Cardiomyopathy, ischemic     EF 45% per ECHO 2008  //   EF 25%, echo, August, 2013 //  Echo (8/15):  Mild LVH, EF 30-35%, ant-lat and lat AK, inf HK, Gr 1 DD, mild MR, mild LAE  . Pulmonary hypertension ([place])     moderate ECHO Jan 2008  . Systolic heart failure   . Multiple sclerosis ([place]) [place] -- LAST VISIT 11-20-2010  NOTE W/ CHART  . Lung nodule     resolved 11-2006 CT Chest  . Tobacco abuse     quit   . Increased prostate specific antigen (PSA) velocity   . Urinary retention     dx ~ 2-12, like from [place], now with a catheter, saw urology  . Carotid artery disease ([place])     a.  Doppler, February, 2012, 0-39% bilateral,Mild smooth plaque;  b.  Carotid US (8/15):  Bilateral 1-39% ICA >>> F/u 2 years  . Chronic indwelling Foley catheter   . Scoliosis associated with other condition   . Fatigue SEVERE  . Impotence   . Hypertension   . H/O pleural effusion 2008    POST THORACENTESIS  . History of colon polyps PRECANCEROUS  . Insomnia   . CAD (coronary artery  disease) CARDIOLOGIST- DR KATZ-- VISIT 06-05-2011 IN EPIC    non-STEMI, 2007.Marland Kitchenoccluded circumflex.. Taxus stent placed...residual 80% LAD...50% RCA  . Urinary tract infection     hx of  . Atherosclerotic PVD with ulceration ([place])     left foot  . PAC (premature atrial contraction)     December, 2013  . Diabetes mellitus     INSULIN-DEPENDent  . Dementia   . Constipation   . TIA (transient ischemic attack)   . Sleep apnea     Past Surgical History  Procedure Laterality Date  . Hernia repair  1990    (R)  . Thoracentesis  2008    PLEURAL EFFUSION  . Cystoscopy  07/30/2011    Procedure: CYSTOSCOPY;  Surgeon: Hanley Ben, MD;  Location: [facility];  Service: Urology;  Laterality: N/A;  . Transurethral resection of prostate  07/30/2011    Procedure: TRANSURETHRAL RESECTION OF THE PROSTATE (TURP);  Surgeon: Hanley Ben, MD;  Location: [facility];  Service: Urology;  Laterality: N/A;  . Femoral-tibial bypass graft  03/11/2012    Procedure: BYPASS GRAFT FEMORAL-TIBIAL ARTERY;  Surgeon: Conrad Paxton[place], MD;  Location: [place];  Service: Vascular;  Laterality: Left;  Left Femoral -Tibial trunk bypass, Endarterectomy of Tibial- Peroneal trunk with vein angioplasty.  . Intraoperative  arteriogram  03/11/2012    Procedure: INTRA OPERATIVE ARTERIOGRAM;  Surgeon: Conrad [place], MD;  Location: [place];  Service: Vascular;  Laterality: Left;  . Femoral-popliteal bypass graft  03/11/2012    Procedure: BYPASS GRAFT FEMORAL-POPLITEAL ARTERY;  Surgeon: Conrad [place], MD;  Location: [place];  Service: Vascular;  Laterality: Left;  embolectomy left lower leg  . Amputation  03/17/2012    Procedure: AMPUTATION ABOVE KNEE;  Surgeon: Conrad Kemah, MD;  Location: [place];  Service: Vascular;  Laterality: Left;  . Coronary angioplasty with stent placement  05-01-2006    OCCLUDED CIRCUMFLEX -- TAXUS STENT PLACMENT  AND RESIDUAL 80% LAD,  50% RCA  . Amputation  06/03/2012    Procedure:  AMPUTATION ABOVE KNEE;  Surgeon: Conrad Eastwood, MD;  Location: [place];  Service: Vascular;  Laterality: Right;  . Lower extremity angiogram Bilateral 12/17/2011    Procedure: LOWER EXTREMITY ANGIOGRAM;  Surgeon: Serafina Mitchell, MD;  Location: [facility] CATH LAB;  Service: Cardiovascular;  Laterality: Bilateral;  bil lower extrem angio  . Abdominal angiogram  12/17/2011    Procedure: ABDOMINAL ANGIOGRAM;  Surgeon: Serafina Mitchell, MD;  Location: [facility] CATH LAB;  Service: Cardiovascular;;  . Abdominal aortagram N/A 08/19/2013    Procedure: ABDOMINAL Maxcine Ham;  Surgeon: Conrad Lea, MD;  Location: [facility] CATH LAB;  Service: Cardiovascular;  Laterality: N/A;  . Ercp N/A 08/25/2014    Procedure: ENDOSCOPIC RETROGRADE CHOLANGIOPANCREATOGRAPHY (ERCP);  Surgeon: Ladene Artist, MD;  Location: [facility] ENDOSCOPY;  Service: Endoscopy;  Laterality: N/A;  . 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 Above knee leg amputation Right     05/2012  . Cholecystectomy  10/25/2014    DR [name]  . Cholecystectomy N/A 10/25/2014    Procedure: LAPAROSCOPIC CHOLECYSTECTOMY ;  Surgeon: Coralie Keens, MD;  Location: [place];  Service: General;  Laterality: N/A;    Current Outpatient Prescriptions  Medication Sig Dispense Refill  . aspirin EC 325 MG tablet Take 325 mg by mouth daily.    Marland Kitchen atorvastatin (LIPITOR) 40 MG tablet Take 1 tablet (40 mg total) by mouth daily. (Patient taking differently: Take 40 mg by mouth daily at 6 PM. ) 30 tablet 5  . azelastine (ASTELIN) 0.1 % nasal spray Place 2 sprays into both nostrils at bedtime as needed for rhinitis. Use in each nostril as directed 30 mL 3  . baclofen (LIORESAL) 10 MG tablet TAKE 1/2 TABLET BY MOUTH TWICE A DAY 30 tablet 3  . Cholecalciferol (VITAMIN D) 2000 units CAPS Take 1 capsule by mouth daily.     . diclofenac sodium (VOLTAREN) 1 % GEL Apply 1 application topically 2 (two) times daily as needed (pain). For pain    . ferrous sulfate 325 (65 FE) MG tablet Take 325 mg by mouth daily  with breakfast.    . glucagon (GLUCAGEN) 1 MG SOLR injection Inject 1 mg into the vein once as needed for low blood sugar. Reported on 08/28/2015    . guaiFENesin-dextromethorphan (ROBITUSSIN DM) 100-10 MG/5ML syrup Take 15 mLs by mouth every 4 (four) hours as needed for cough.    . insulin aspart protamine- aspart (NOVOLOG MIX 70/30) (70-30) 100 UNIT/ML injection Inject 0.05 mLs (5 Units total) into the skin 2 (two) times daily with a meal. (Patient taking differently: Inject 10-45 Units into the skin 2 (two) times daily with a meal. 45 units for blood sugar > 120 at breakfast (<100 not given), 10 units every evening) 10 mL 0  .  lisinopril (PRINIVIL,ZESTRIL) 10 MG tablet Take 1 tablet (10 mg total) by mouth daily. 30 tablet 5  . LYRICA 150 MG capsule TAKE ONE CAPSULE BY MOUTH TWICE A DAY 60 capsule 5  . Multiple Vitamins-Minerals (MULTIVITAMIN WITH MINERALS) tablet Take 1 tablet by mouth daily.    . mupirocin ointment (BACTROBAN) 2 % Apply topically 2 (two) times daily as needed. 30 g 0  . pantoprazole (PROTONIX) 40 MG tablet Take 1 tablet (40 mg total) by mouth daily with lunch. 90 tablet 1   No current facility-administered medications for this visit.    Allergies as of 09/27/2015 - Review Complete 09/27/2015  Allergen Reaction Noted  . Bee venom Anaphylaxis 12/11/2011  . Influenza vaccines Other (See Comments) 05/25/2014    Family History  Problem Relation Age of Onset  . Heart attack Mother 93  . Heart disease Mother   . Stroke Mother   . Hyperlipidemia Mother   . Hypertension Mother   . Colon cancer Neg Hx   . Prostate cancer Neg Hx   . COPD Father   . Peripheral vascular disease Father   . Diabetes Brother   . Heart disease Brother   . Hypertension Brother   . Heart attack Brother   . Diabetes Daughter     Social History   Social History  . Marital Status: Married    Spouse Name: N/A  . Number of Children: 2  . Years of Education: N/A   Occupational History  .  disable    Social History Main Topics  . Smoking status: Former Smoker -- 22 years    Quit date: 07/22/2012  . Smokeless tobacco: Current User     Comment: pt states that he is using E-cigs  . Alcohol Use: No  . Drug Use: No  . Sexual Activity: No     Comment: electronic cigarettes no nicotene   Other Topics Concern  . Not on file   Social History Narrative   Lives w/ wife     Physical Exam: BP 108/68 mmHg  Pulse 72 Constitutional: Chronically ill-appearing, wheelchair bound, indwelling bladder catheter Psychiatric: alert and oriented x3 Eyes: extraocular movements intact Mouth: oral pharynx moist, no lesions Neck: supple no lymphadenopathy Cardiovascular: heart regular rate and rhythm Lungs: clear to auscultation bilaterally Abdomen: soft, nontender, nondistended, no obvious ascites, no peritoneal signs, normal bowel sounds Extremities: no lower extremity edema bilaterally Skin: no lesions on visible extremities   Assessment and plan: 75 y.o. male with  personal history of precancerous polyp  He is wheelchair bound, elderly. His last colonoscopy was in 2011. A single small polyp was removed however not retrieved. This might have been hyperplastic. Even if it was proven to have been precancerous current guidelines leave option for repeat colonoscopy anywhere from 5-10 years. Colonoscopy is very difficult for him to undergo given is wheelchair bound status. He has had to be admitted to the hospital previously prepped for it. I think in this instance [employer] testing is perfectly reasonable for him. He understands this is a DNA based test, more sensitive and specific than simple fecal occult blood testing. If this test is positive then we would proceed with colonoscopy however if it is negative then it is very unlikely he has anything significant in the colon and I think he could forego further screening.   Owens Loffler, MD [facility] 09/27/2015, 2:08  PM  Cc: Colon Branch, MD

## 2015-10-06 ENCOUNTER — Other Ambulatory Visit: Payer: Self-pay | Admitting: *Deleted

## 2015-10-11 ENCOUNTER — Other Ambulatory Visit: Payer: Self-pay | Admitting: *Deleted

## 2015-10-11 ENCOUNTER — Encounter: Payer: Self-pay | Admitting: *Deleted

## 2015-10-11 DIAGNOSIS — Z1211 Encounter for screening for malignant neoplasm of colon: Secondary | ICD-10-CM | POA: Diagnosis not present

## 2015-10-11 DIAGNOSIS — Z1212 Encounter for screening for malignant neoplasm of rectum: Secondary | ICD-10-CM | POA: Diagnosis not present

## 2015-10-11 LAB — COLOGUARD: Cologuard: NEGATIVE

## 2015-10-11 NOTE — Patient Outreach (Signed)
[facility]) Care Management  10/11/2015  KATHLEEN PRESCOTT 02-24-1941 [id number]   S:  Pt is doing well today. He has had a GI virus with diarrhea for several days which is resolved for 48 hours. Pt has seen his primary care provider and GI specialist recently. He will collect stool specimens and mail back to GI for evaluation. If those are negative, he will not have to undergo colonoscopy. The pt is hopeful that is the case!  Mrs. Vasiliou reports that his glucose levels are always within therapeutic range.  Pt still complains of phantom pain but is getting improved relief with the increase in his Lyrica to bid dosing.  Pt reports tearing of L eye.   O:..BP 160/80 mmHg  Pulse 97  Resp 18  SpO2 98%        Eyes appear normal no redness       RRR       Lungs are clear       Abdomen with bowel sounds active all 4 quads, no tenderness       Condom cath in place, clear light yellow urine.  A:   MS       DM - type II       Chronic pain       Possible blocked tear duct  P:  Continue current plan of care. Call NP if any problems arise. I will see Mr. Zenon again in one        Month.            Pt to try lacrimal sac massage and warm compresses to see if this may help reduce his tearing.  Jamel Winker [facility] [place] [phone]

## 2015-11-06 ENCOUNTER — Telehealth: Payer: Self-pay | Admitting: Gastroenterology

## 2015-11-06 NOTE — Telephone Encounter (Signed)
Notified Ms. Dickens. She will tell the patient

## 2015-11-06 NOTE — Telephone Encounter (Signed)
Cologuard stool test was negative, please call him.  He does not need further colon cancer screening, polyp surveillance testing.

## 2015-11-13 ENCOUNTER — Encounter: Payer: Self-pay | Admitting: Gastroenterology

## 2015-11-16 ENCOUNTER — Encounter: Payer: Self-pay | Admitting: *Deleted

## 2015-11-16 ENCOUNTER — Other Ambulatory Visit: Payer: Self-pay | Admitting: *Deleted

## 2015-11-16 NOTE — Patient Outreach (Signed)
[facility] [facility]) Care Management  11/16/2015  Jaime [name] 1941-04-19 [id number]   S:  Pt had some chest congestion this am and he took a big swallow of Delsym. He got rid of the tickle and had the side effect of terrible diarrhea. He says he won't do that again.  Pt is getting some dental work done. He will get a new upper plate.  He reports "a lump in my throat." He says it comes and goes. When he feels it it is mid line and at the level of his chroichoid process. He is not having any difficulty swallowing and this does not make it hard to breath.  O:  BP 120/80 mmHg  Pulse 78  Resp 18  SpO2 97% FBS 98       RRR       Lungs clear       Foley catheter is in place. Light yellow urine noted in bag.  A:  MS      Diabetes      Medication misuse (Delsym)  P:  Educated on appropriate dose of Delsym, 1 tsp bid only.      Provided education to [name] about a caregiver class starting tonight to support caregivers.      Discussed eye issues. Reinforced good hand hygiene when trying to massage tear ducts to            prevent eye infection.      I will see pt again in a month and if he continues to be stable, I will close his case. I have advised [name]             they may continue to consult with me as needed and I would be gald to visit if he was acutely ill and could not get             to the MD office.  Sandip Gottschall [facility] [place] [phone]

## 2015-11-27 ENCOUNTER — Ambulatory Visit: Payer: Medicare Other | Admitting: Internal Medicine

## 2015-12-05 ENCOUNTER — Ambulatory Visit: Payer: Medicare Other | Admitting: Neurology

## 2015-12-06 ENCOUNTER — Telehealth: Payer: Self-pay | Admitting: Internal Medicine

## 2015-12-06 ENCOUNTER — Ambulatory Visit: Payer: Medicare Other | Admitting: Internal Medicine

## 2015-12-06 NOTE — Telephone Encounter (Signed)
Pt was no show today, 1st no show I see, pt has not rescheduled, charge or no charge?

## 2015-12-07 NOTE — Telephone Encounter (Signed)
No charge. 

## 2015-12-12 ENCOUNTER — Encounter: Payer: Medicare Other | Admitting: Cardiology

## 2015-12-14 ENCOUNTER — Encounter: Payer: Self-pay | Admitting: *Deleted

## 2015-12-14 ENCOUNTER — Other Ambulatory Visit: Payer: Self-pay | Admitting: *Deleted

## 2015-12-14 NOTE — Patient Outreach (Addendum)
[facility]) Care Management  12/14/2015  Jaime [name] [birth year] [id number]    S:  Today, is pt's discharge day. He has been stable and I don't feel I need to come monthly any longer. He complains of bronchial secretions he cannot expectorate. No other new problems.  O:  BP 96/50 mmHg  Pulse 108  Resp 16  SpO2 96%        Alert and oriented, conversational and cheerful.       RRR       Lungs clear at present.  A: MS     Bronchial secretions  P:  Start mucinex (plain) bid      Gave Rx for Tussinex 1 teaspoon hs prn  to help quiet his cough. 100 cc  Dr. Larose Kells - consider at this time that some nebulizer treatments may be beneficial.                 Also a bed side suction machine with yankauner suction that pt can use his self to                 Remove secretions from his throat.  THN CM Care Plan Problem One        Most Recent Value   Care Plan Problem One  Mobility problems, bilateral amputee, MS   Role Documenting the Problem One  Care Management Coordinator   Care Plan for Problem One  Not Active   THN Long Term Goal (31-90 days)  Pt will not be hospitazed in the next 90 days.   THN Long Term Goal Start Date  10/11/15   THN Long Term Goal Met Date  12/14/15   [facility] CM Short Term Goal #1 (0-30 days)  Pt will not have a fall in the next 30 days.   THN CM Short Term Goal #1 Start Date  10/11/15   [facility] CM Short Term Goal #1 Met Date  11/16/15   Interventions for Short Term Goal #1  Reinforced safety precautions.   THN CM Short Term Goal #2 (0-30 days)  Pt will not have skin breakdown in next 30 days.   THN CM Short Term Goal #2 Start Date  10/11/15   [facility] CM Short Term Goal #2 Met Date  11/16/15   Interventions for Short Term Goal #2  Reinforced good skin care, turning q2 hours.    THN CM Care Plan Problem Two        Most Recent Value   Care Plan Problem Two  Pt misuse of medication.   Role Documenting the Problem Two  Care Management Coordinator   Care  Plan for Problem Two  Not Active   THN CM Short Term Goal #1 (0-30 days)  Pt will not self medicate over the next 30 days, wife to supervise all medication administration.   THN CM Short Term Goal #1 Start Date  11/16/15   [facility] CM Short Term Goal #1 Met Date   12/14/15      [facility] [place] [phone]

## 2015-12-17 ENCOUNTER — Other Ambulatory Visit: Payer: Self-pay | Admitting: Internal Medicine

## 2015-12-19 DIAGNOSIS — R32 Unspecified urinary incontinence: Secondary | ICD-10-CM | POA: Diagnosis not present

## 2015-12-28 ENCOUNTER — Encounter: Payer: Self-pay | Admitting: Cardiology

## 2015-12-28 ENCOUNTER — Ambulatory Visit (INDEPENDENT_AMBULATORY_CARE_PROVIDER_SITE_OTHER): Payer: Medicare Other | Admitting: Cardiology

## 2015-12-28 VITALS — BP 124/62 | HR 52

## 2015-12-28 DIAGNOSIS — I255 Ischemic cardiomyopathy: Secondary | ICD-10-CM

## 2015-12-28 DIAGNOSIS — I2510 Atherosclerotic heart disease of native coronary artery without angina pectoris: Secondary | ICD-10-CM

## 2015-12-28 DIAGNOSIS — I5022 Chronic systolic (congestive) heart failure: Secondary | ICD-10-CM

## 2015-12-28 DIAGNOSIS — I10 Essential (primary) hypertension: Secondary | ICD-10-CM

## 2015-12-28 DIAGNOSIS — G35 Multiple sclerosis: Secondary | ICD-10-CM

## 2015-12-28 DIAGNOSIS — I779 Disorder of arteries and arterioles, unspecified: Secondary | ICD-10-CM

## 2015-12-28 DIAGNOSIS — R0602 Shortness of breath: Secondary | ICD-10-CM | POA: Insufficient documentation

## 2015-12-28 DIAGNOSIS — I739 Peripheral vascular disease, unspecified: Secondary | ICD-10-CM

## 2015-12-28 MED ORDER — ALBUTEROL SULFATE HFA 108 (90 BASE) MCG/ACT IN AERS: 2.0000 | INHALATION_SPRAY | Freq: Four times a day (QID) | RESPIRATORY_TRACT | Status: AC | PRN

## 2015-12-28 NOTE — Progress Notes (Signed)
Cardiology Office Note    Date:  12/28/2015   ID:  Jaime [name], DOB May 21, 1941, MRN [id number]  PCP:  [name], MD  Cardiologist:  Dr Ron Parker -->  [name], MD   No chief complaint on file.   History of Present Illness:  Jaime [name] is a 75 y.o. male with h/o MS, s/p B/L AKA, wheelchair bound, incontinence, CAD, s/p PCI to LCX in 2007, with h/o ischemic CMP, the last LVEF 35% on echo in 07/2015, mildly reduced RVEF. H/O PAD.  He used to see Dr Ron Parker, the last visit in 2015.  Today he states that he has no chest pain or SOB, he sleeps upright in a hospital bed and has no PND. No palpitations or syncope.  He has been experiencing cough with episodes of intermittent SOB in the last 3 days.   Past Medical History  Diagnosis Date  . Hyperlipidemia   . Cardiomyopathy, ischemic     EF 45% per ECHO 2008  //   EF 25%, echo, August, 2013 //  Echo (8/15):  Mild LVH, EF 30-35%, ant-lat and lat AK, inf HK, Gr 1 DD, mild MR, mild LAE  . Pulmonary hypertension ([place])     moderate ECHO Jan 2008  . Systolic heart failure   . Multiple sclerosis ([place]) [place] -- LAST VISIT 11-20-2010  NOTE W/ CHART  . Lung nodule     resolved 11-2006 CT Chest  . Tobacco abuse     quit   . Increased prostate specific antigen (PSA) velocity   . Urinary retention     dx ~ 2-12, like from [place], now with a catheter, saw urology  . Carotid artery disease ([place])     a.  Doppler, February, 2012, 0-39% bilateral,Mild smooth plaque;  b.  Carotid US (8/15):  Bilateral 1-39% ICA >>> F/u 2 years  . Chronic indwelling Foley catheter   . Scoliosis associated with other condition   . Fatigue SEVERE  . Impotence   . Hypertension   . H/O pleural effusion 2008    POST THORACENTESIS  . History of colon polyps PRECANCEROUS  . Insomnia   . CAD (coronary artery disease) CARDIOLOGIST- DR KATZ-- VISIT 06-05-2011 IN [facility]    non-STEMI, 2007.Marland Kitchenoccluded circumflex.. Taxus stent placed...residual  80% LAD...50% RCA  . Urinary tract infection     hx of  . Atherosclerotic PVD with ulceration ([place])     left foot  . PAC (premature atrial contraction)     December, 2013  . Diabetes mellitus     INSULIN-DEPENDent  . Dementia   . Constipation   . TIA (transient ischemic attack)   . Sleep apnea     Past Surgical History  Procedure Laterality Date  . Hernia repair  1990    (R)  . Thoracentesis  2008    PLEURAL EFFUSION  . Cystoscopy  07/30/2011    Procedure: CYSTOSCOPY;  Surgeon: Hanley Ben, MD;  Location: [facility];  Service: Urology;  Laterality: N/A;  . Transurethral resection of prostate  07/30/2011    Procedure: TRANSURETHRAL RESECTION OF THE PROSTATE (TURP);  Surgeon: Hanley Ben, MD;  Location: [facility];  Service: Urology;  Laterality: N/A;  . Femoral-tibial bypass graft  03/11/2012    Procedure: BYPASS GRAFT FEMORAL-TIBIAL ARTERY;  Surgeon: Conrad Velarde, MD;  Location: [facility] OR;  Service: Vascular;  Laterality: Left;  Left Femoral -Tibial trunk bypass, Endarterectomy of Tibial-  Peroneal trunk with vein angioplasty.  . Intraoperative arteriogram  03/11/2012    Procedure: INTRA OPERATIVE ARTERIOGRAM;  Surgeon: Conrad Interlaken, MD;  Location: [place];  Service: Vascular;  Laterality: Left;  . Femoral-popliteal bypass graft  03/11/2012    Procedure: BYPASS GRAFT FEMORAL-POPLITEAL ARTERY;  Surgeon: Conrad Liberty, MD;  Location: [place];  Service: Vascular;  Laterality: Left;  embolectomy left lower leg  . Amputation  03/17/2012    Procedure: AMPUTATION ABOVE KNEE;  Surgeon: Conrad Arnold, MD;  Location: [place];  Service: Vascular;  Laterality: Left;  . Coronary angioplasty with stent placement  05-01-2006    OCCLUDED CIRCUMFLEX -- TAXUS STENT PLACMENT  AND RESIDUAL 80% LAD,  50% RCA  . Amputation  06/03/2012    Procedure: AMPUTATION ABOVE KNEE;  Surgeon: Conrad Iola, MD;  Location: [place];  Service: Vascular;  Laterality: Right;  . Lower extremity  angiogram Bilateral 12/17/2011    Procedure: LOWER EXTREMITY ANGIOGRAM;  Surgeon: Serafina Mitchell, MD;  [facility] C[facility] LAB;  Service: Cardiovascular;  Laterality: Bilateral;  bil lower extrem angio  . Abdominal angiogram  12/17/2011    Procedure: ABDOMINAL ANGIOGRAM;  Surgeon: Serafina Mitchell, MD;  Location: [facility] CATH LAB;  Service: Cardiovascular;;  . Abdominal aortagram N/A 08/19/2013    Procedure: ABDOMINAL Maxcine Ham;  Surgeon: Conrad Stonefort, MD;  Location: [facility] CATH LAB;  Service: Cardiovascular;  Laterality: N/A;  . Ercp N/A 08/25/2014    Procedure: ENDOSCOPIC RETROGRADE CHOLANGIOPANCREATOGRAPHY (ERCP);  Surgeon: Ladene Artist, MD;  Location: [facility] ENDOSCOPY;  Service: Endoscopy;  Laterality: N/A;  . 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 Above knee leg amputation Right     05/2012  . Cholecystectomy  10/25/2014    DR [name]  . Cholecystectomy N/A 10/25/2014    Procedure: LAPAROSCOPIC CHOLECYSTECTOMY ;  Surgeon: Coralie Keens, MD;  Location: [place];  Service: General;  Laterality: N/A;    Current Medications: Outpatient Prescriptions Prior to Visit  Medication Sig Dispense Refill  . aspirin EC 325 MG tablet Take 325 mg by mouth daily.    Marland Kitchen atorvastatin (LIPITOR) 40 MG tablet Take 1 tablet (40 mg total) by mouth daily. (Patient taking differently: Take 40 mg by mouth daily at 6 PM. ) 30 tablet 5  . azelastine (ASTELIN) 0.1 % nasal spray Place 2 sprays into both nostrils at bedtime as needed for rhinitis. Use in each nostril as directed 30 mL 3  . baclofen (LIORESAL) 10 MG tablet TAKE 1/2 TABLET BY MOUTH TWICE A DAY 30 tablet 3  . Cholecalciferol (VITAMIN D) 2000 units CAPS Take 1 capsule by mouth daily.     . diclofenac sodium (VOLTAREN) 1 % GEL Apply 1 application topically 2 (two) times daily as needed (pain). For pain    . glucagon (GLUCAGEN) 1 MG SOLR injection Inject 1 mg into the vein once as needed for low blood sugar. Reported on 11/16/2015    . guaiFENesin-dextromethorphan  (ROBITUSSIN DM) 100-10 MG/5ML syrup Take 15 mLs by mouth every 4 (four) hours as needed for cough.    . insulin aspart protamine- aspart (NOVOLOG MIX 70/30) (70-30) 100 UNIT/ML injection Inject 0.05 mLs (5 Units total) into the skin 2 (two) times daily with a meal. 10 mL 0  . lisinopril (PRINIVIL,ZESTRIL) 10 MG tablet Take 1 tablet (10 mg total) by mouth daily. 30 tablet 1  . LYRICA 150 MG capsule TAKE ONE CAPSULE BY MOUTH TWICE A DAY 60 capsule 5  . Multiple  Vitamins-Minerals (MULTIVITAMIN WITH MINERALS) tablet Take 1 tablet by mouth daily.    . mupirocin ointment (BACTROBAN) 2 % Apply topically 2 (two) times daily as needed. 30 g 0  . pantoprazole (PROTONIX) 40 MG tablet Take 1 tablet (40 mg total) by mouth daily with lunch. 90 tablet 1  . ferrous sulfate 325 (65 FE) MG tablet Take 325 mg by mouth daily with breakfast.     No facility-administered medications prior to visit.     Allergies:   Bee venom and Influenza vaccines   Social History   Social History  . Marital Status: Married    Spouse Name: N/A  . Number of Children: 2  . Years of Education: N/A   Occupational History  . disable    Social History Main Topics  . Smoking status: Former Smoker -- 57 years    Quit date: 07/22/2012  . Smokeless tobacco: Current User     Comment: pt states that he is using E-cigs  . Alcohol Use: No  . Drug Use: No  . Sexual Activity: No     Comment: electronic cigarettes no nicotene   Other Topics Concern  . None   Social History Narrative   Lives w/ wife     Family History:  The patient's family history includes COPD in his father; Diabetes in his brother and daughter; Heart attack in his brother; Heart attack (age of onset: 64) in his mother; Heart disease in his brother and mother; Hyperlipidemia in his mother; Hypertension in his brother and mother; Peripheral vascular disease in his father; Stroke in his mother. There is no history of Colon cancer or Prostate cancer.   ROS:     Please see the history of present illness.    ROS All other systems reviewed and are negative.   PHYSICAL EXAM:   VS:  BP 124/62 mmHg  Pulse 52  SpO2 94%   GEN: in no acute distress, in an electric wheelchair, with difficulties to move HEENT: normal Neck: no JVD, carotid bruits, or masses Cardiac: RRR; no murmurs, rubs, or gallops,no edema, AKA ampuation B/L Respiratory:  clear to auscultation bilaterally, normal work of breathing GI: soft, nontender, nondistended, + BS Skin: warm and dry, no rash Neuro:  Alert and Oriented x 3, Strength and sensation are intact Psych: euthymic mood, full affect  Wt Readings from Last 3 Encounters:  08/28/15 140 lb (63.504 kg)  08/13/15 140 lb 6.9 oz (63.7 kg)  11/02/14 140 lb 10.5 oz (63.8 kg)      Studies/Labs Reviewed:   EKG:  EKG is ordered today.  The ekg ordered today demonstrates SB, LBBB, unchanged from prior ECG  Recent Labs: 08/13/2015: ALT 31; B Natriuretic Peptide 208.6*; Hemoglobin 15.4; Platelets 178 08/14/2015: BUN 18; Creatinine, Ser 0.59*; Potassium 3.8; Sodium 141   Lipid Panel    Component Value Date/Time   CHOL 104 01/24/2015 1614   TRIG 75.0 01/24/2015 1614   HDL 31.10* 01/24/2015 1614   CHOLHDL 3 01/24/2015 1614   VLDL 15.0 01/24/2015 1614   LDLCALC 58 01/24/2015 1614   LDLDIRECT 123.5 03/17/2008 1049    Additional studies/ records that were reviewed today include:   08/14/2015 Left ventricle: The cavity size was normal. Systolic function was  moderately reduced. The estimated ejection fraction was 35%.  Diffuse hypokinesis worse in the inferior and inferolateral  regions. Doppler parameters are consistent with abnormal left  ventricular relaxation (grade 1 diastolic dysfunction). - Mitral valve: There was mild regurgitation. - Right ventricle:  The cavity size was normal. Wall thickness was  normal. Systolic function was mildly reduced. - Tricuspid valve: There was mild regurgitation. - Inferior vena  cava: The vessel was normal in size. The  respirophasic diameter changes were in the normal range (>= 50%),  consistent with normal central venous pressure.    ASSESSMENT:    1. Essential hypertension   2. Chronic systolic CHF (congestive heart failure) ([place])   3. Bilateral carotid artery disease ([place])   4. Shortness of breath   5. Multiple sclerosis (HCC)   6. PAD (peripheral artery disease) (HCC)   7. Cardiomyopathy, ischemic   8. Coronary artery disease involving native coronary artery of native heart without angina pectoris      PLAN:  In order of problems listed above:  1. CAD - asymptomatic, we will continue the same medical management, ASA, atorvastatin, lisinopril.  2. Ischemic CMP, lVEF stable at 35%, no palpitations or syncope, , no BB as he is bradycardic, continue lisinopril.  3. SOB - seems to be related to URI, postnasal drip with muscle weakness associated with MS.   4.  Chronic systolic CHF - no evidence of fluid overload   5. Hypertension - controlled.   Medication Adjustments/Labs and Tests Ordered: Current medicines are reviewed at length with the patient today.  Concerns regarding medicines are outlined above.  Medication changes, Labs and Tests ordered today are listed in the Patient Instructions below. Patient Instructions  Medication Instructions:   STOP TAKING IRON NOW  DR Meda Coffee HAS PRESCRIBED YOUR ALBUTEROL INHALER 2 PUFFS AS NEEDED FOR SOB OR WHEEZING    Follow-Up:  Your physician wants you to follow-up in: [place] will receive a reminder letter in the mail two months in advance. If you don't receive a letter, please call our office to schedule the follow-up appointment.      If you need a refill on your cardiac medications before your next appointment, please call your pharmacy.       Signed, [name], MD  12/28/2015 10:44 PM    [address], [place], [place]   [address] Phone: [phone]; Fax: [phone]

## 2015-12-28 NOTE — Patient Instructions (Signed)
Medication Instructions:   STOP TAKING IRON NOW  DR Meda Coffee HAS PRESCRIBED YOUR ALBUTEROL INHALER 2 PUFFS AS NEEDED FOR SOB OR WHEEZING    Follow-Up:  Your physician wants you to follow-up in: [place] will receive a reminder letter in the mail two months in advance. If you don't receive a letter, please call our office to schedule the follow-up appointment.      If you need a refill on your cardiac medications before your next appointment, please call your pharmacy.

## 2016-01-03 ENCOUNTER — Telehealth: Payer: Self-pay | Admitting: Internal Medicine

## 2016-01-03 ENCOUNTER — Encounter (INDEPENDENT_AMBULATORY_CARE_PROVIDER_SITE_OTHER): Payer: Medicare Other | Admitting: Internal Medicine

## 2016-01-03 DIAGNOSIS — I10 Essential (primary) hypertension: Secondary | ICD-10-CM

## 2016-01-03 NOTE — Telephone Encounter (Signed)
Noted  

## 2016-01-03 NOTE — Progress Notes (Deleted)
   Subjective:    Patient ID: Jaime [name], male    DOB: Nov 12, 1940, 75 y.o.   MRN: [id number]  DOS:  01/03/2016  Error    Review of Systems             Objective:   Physical Exam

## 2016-01-03 NOTE — Telephone Encounter (Signed)
Pt's wife states that she will call the [facility] office to see if anyone will accept the pt has a transfer pt. States it is entirely impossible for her to always be on time due to the fact that the pt makes a lot of accidents frequently and she has to clean him up before they leave, and it is very far to get here and to come all the way out here and then can't be seen is to much. States she will call back and reschedule if she can't get an appt at the [facility] office

## 2016-01-03 NOTE — Telephone Encounter (Signed)
Ok, thx

## 2016-01-03 NOTE — Telephone Encounter (Signed)
Pt request to transfer from Dr. Larose Kells to [name] due to distance. Please advise.

## 2016-01-03 NOTE — Telephone Encounter (Signed)
FYI

## 2016-01-04 NOTE — Telephone Encounter (Signed)
ok 

## 2016-01-04 NOTE — Progress Notes (Signed)
error 

## 2016-01-04 NOTE — Telephone Encounter (Signed)
Left vm inform pt.

## 2016-01-08 NOTE — Telephone Encounter (Signed)
No Charge 

## 2016-01-08 NOTE — Telephone Encounter (Signed)
Charge or no charge for 6/14?

## 2016-01-23 ENCOUNTER — Other Ambulatory Visit: Payer: Self-pay | Admitting: Neurology

## 2016-01-24 NOTE — Telephone Encounter (Signed)
Continue Lyrica 150mg  twice daily and baclofen 5mg   Last OV: 06/21/15 Next OV: 06/21/16

## 2016-02-03 ENCOUNTER — Other Ambulatory Visit: Payer: Self-pay | Admitting: Internal Medicine

## 2016-02-15 ENCOUNTER — Telehealth: Payer: Self-pay | Admitting: Neurology

## 2016-02-15 MED ORDER — PREGABALIN 150 MG PO CAPS: 150.0000 mg | ORAL_CAPSULE | Freq: Two times a day (BID) | ORAL | 5 refills | Status: DC

## 2016-02-15 NOTE — Telephone Encounter (Signed)
PLAN: 1.  Continue Lyrica 150mg  twice daily and baclofen 5mg     New RX sent to pharmacy.

## 2016-02-23 ENCOUNTER — Other Ambulatory Visit: Payer: Self-pay | Admitting: Internal Medicine

## 2016-02-23 ENCOUNTER — Telehealth: Payer: Self-pay | Admitting: Neurology

## 2016-02-23 NOTE — Telephone Encounter (Signed)
RX called into pharmacy

## 2016-02-23 NOTE — Telephone Encounter (Signed)
Patient wife called and states that [facility] does not have the rx we called in for the lyrica 150mg  on 02-15-16 please resend the rx

## 2016-02-28 DIAGNOSIS — E7800 Pure hypercholesterolemia, unspecified: Secondary | ICD-10-CM | POA: Diagnosis not present

## 2016-02-28 DIAGNOSIS — I10 Essential (primary) hypertension: Secondary | ICD-10-CM | POA: Diagnosis not present

## 2016-02-28 DIAGNOSIS — E1165 Type 2 diabetes mellitus with hyperglycemia: Secondary | ICD-10-CM | POA: Diagnosis not present

## 2016-03-06 ENCOUNTER — Ambulatory Visit (INDEPENDENT_AMBULATORY_CARE_PROVIDER_SITE_OTHER): Payer: Medicare Other | Admitting: Neurology

## 2016-03-06 ENCOUNTER — Other Ambulatory Visit (INDEPENDENT_AMBULATORY_CARE_PROVIDER_SITE_OTHER): Payer: Medicare Other

## 2016-03-06 ENCOUNTER — Encounter: Payer: Self-pay | Admitting: Neurology

## 2016-03-06 VITALS — BP 166/68 | HR 60

## 2016-03-06 DIAGNOSIS — G546 Phantom limb syndrome with pain: Secondary | ICD-10-CM | POA: Diagnosis not present

## 2016-03-06 DIAGNOSIS — G35 Multiple sclerosis: Secondary | ICD-10-CM | POA: Diagnosis not present

## 2016-03-06 DIAGNOSIS — R1310 Dysphagia, unspecified: Secondary | ICD-10-CM

## 2016-03-06 DIAGNOSIS — G3184 Mild cognitive impairment, so stated: Secondary | ICD-10-CM | POA: Diagnosis not present

## 2016-03-06 DIAGNOSIS — I10 Essential (primary) hypertension: Secondary | ICD-10-CM

## 2016-03-06 DIAGNOSIS — I255 Ischemic cardiomyopathy: Secondary | ICD-10-CM | POA: Diagnosis not present

## 2016-03-06 LAB — TSH: TSH: 1.45 u[IU]/mL (ref 0.35–4.50)

## 2016-03-06 LAB — VITAMIN D 25 HYDROXY (VIT D DEFICIENCY, FRACTURES): VITD: 33.27 ng/mL (ref 30.00–100.00)

## 2016-03-06 LAB — VITAMIN B12: Vitamin B-12: 884 pg/mL (ref 211–911)

## 2016-03-06 MED ORDER — TRAMADOL HCL 50 MG PO TABS: 50.0000 mg | ORAL_TABLET | Freq: Four times a day (QID) | ORAL | 1 refills | Status: DC | PRN

## 2016-03-06 NOTE — Patient Instructions (Addendum)
1.  Continue Lyrica 150mg  twice daily.  When you get episodes of severe pain, may use tramadol 50mg .  TAKE SPARINGLY.  May take every 6 hours as needed. 2.  We will check vitamin D and B12 levels and TSH. 3.  Check barium swallow 4.  Follow up in 9 months.

## 2016-03-06 NOTE — Progress Notes (Signed)
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  He is accompanied by his wife who provides some history.     UPDATE: For pain, he takes Lyrica 150mg  twice daily.  He also takes baclofen 5mg  twice daily.  He does have occasional breakthrough pain that is severe.  It usually occurs with change in weather.  He also has had "congestion".  Sometimes when he speaks, it sounds like he has phlegm caught in his throat.  He says food sometimes gets stuck when he eats.  He denies problems with drinking fluids.  Vitamin D level from last November was 28.67, so I advised that he start D3 2000 IU daily.   HISTORY: He was diagnosed with MS in 1993.  Initially, he developed right arm and leg weakness along with balance problems.  Symptoms initially improved but had progressed over the years.  He was on multiple immunomodulating agents such as Betaseron, Copaxone, Cytoxin and monthly Solumedrol.  He currently is not on an agent and hasn't had a flare up in 4 years.  He has bilateral ATK ambutations performed in 2013 as a consequence of his peripheral vascular and diabetes.  He requires assistance with bathing and dressing, but he is able to feed himself.  He uses a power wheelchair.  He had been on amantadine for many years.   MRI Brain w/wo from 11/29/04 demonstrated "extensive white matter disease in the supratentorial compartment, which is consistent with advanced multiple sclerosis.  There is confluence of the white matter lesions with atrophy and hydrocephalus, thinned corpus callosum, and black holes, however somewhat unusual for MS of this degree, no infrantentorial lesions are seen and no active lesions are identified by means of contrast." CT of  head was performed on 03/23/12 for encephalopathy, which revealed atrophy and nonspecific small vessel ischemic changes.   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  He has some poor short-term memory. Sometimes he calls his wife by his mother's name.  He cannot always remember the names of his great grandchildren.  He has more difficulty figuring out how to maneuver the motorized wheelchair.   Prior medication:  Gabapentin was ineffective.  Amantadine was discontinued due to nightmares.   He has [place].  He is unable to sit due to pain and often is laying in bed.  He is frequently turned to avoid pressure ulcers.  PAST MEDICAL HISTORY: Past Medical History:  Diagnosis Date  . Atherosclerotic PVD with ulceration ([place])    left foot  . CAD (coronary artery disease) CARDIOLOGIST- DR KATZ-- VISIT 06-05-2011 IN [facility]   non-STEMI, 2007.Marland Kitchenoccluded circumflex.. Taxus stent placed...residual 80% LAD...50% RCA  . Cardiomyopathy, ischemic    EF 45% per ECHO 2008  //   EF 25%, echo, August, 2013 //  Echo (8/15):  Mild LVH, EF 30-35%, ant-lat and lat AK, inf HK, Gr 1 DD, mild MR, mild LAE  . Carotid artery disease ([place])    a.  Doppler, February, 2012, 0-39% bilateral,Mild smooth plaque;  b.  Carotid US (8/15):  Bilateral 1-39% ICA >>> F/u 2 years  . Chronic indwelling Foley catheter   . Constipation   . Dementia   . Diabetes mellitus    INSULIN-DEPENDent  . Fatigue SEVERE  .  H/O pleural effusion 2008   POST THORACENTESIS  . History of colon polyps PRECANCEROUS  . Hyperlipidemia   . Hypertension   . Impotence   . Increased prostate specific antigen (PSA) velocity   . Insomnia   . Lung nodule    resolved 11-2006 CT Chest  . Multiple sclerosis ([place] -- LAST VISIT 11-20-2010  NOTE W/ CHART  . PAC (premature atrial contraction)    December, 2013  . Pulmonary hypertension ([place])    moderate ECHO Jan 2008  . Scoliosis associated with other  condition   . Sleep apnea   . Systolic heart failure   . TIA (transient ischemic attack)   . Tobacco abuse    quit   . Urinary retention    dx ~ 2-12, like from [place], now with a catheter, saw urology  . Urinary tract infection    hx of    MEDICATIONS: Current Outpatient Prescriptions on File Prior to Visit  Medication Sig Dispense Refill  . albuterol (PROVENTIL HFA;VENTOLIN HFA) 108 (90 Base) MCG/ACT inhaler Inhale 2 puffs into the lungs every 6 (six) hours as needed for wheezing or shortness of breath. 1 Inhaler 2  . aspirin EC 325 MG tablet Take 325 mg by mouth daily.    Marland Kitchen atorvastatin (LIPITOR) 40 MG tablet Take 1 tablet (40 mg total) by mouth daily. (Patient taking differently: Take 40 mg by mouth daily at 6 PM. ) 30 tablet 5  . azelastine (ASTELIN) 0.1 % nasal spray Place 2 sprays into both nostrils at bedtime as needed for rhinitis. Use in each nostril as directed 30 mL 3  . baclofen (LIORESAL) 10 MG tablet Take 0.5 tablets (5 mg total) by mouth 2 (two) times daily. 30 tablet 4  . Cholecalciferol (VITAMIN D) 2000 units CAPS Take 1 capsule by mouth daily.     . diclofenac sodium (VOLTAREN) 1 % GEL Apply 1 application topically 2 (two) times daily as needed (pain). For pain    . glucagon (GLUCAGEN) 1 MG SOLR injection Inject 1 mg into the vein once as needed for low blood sugar. Reported on 11/16/2015    . guaiFENesin-dextromethorphan (ROBITUSSIN DM) 100-10 MG/5ML syrup Take 15 mLs by mouth every 4 (four) hours as needed for cough.    . insulin aspart protamine- aspart (NOVOLOG MIX 70/30) (70-30) 100 UNIT/ML injection Inject 0.05 mLs (5 Units total) into the skin 2 (two) times daily with a meal. 10 mL 0  . lisinopril (PRINIVIL,ZESTRIL) 10 MG tablet TAKE 1 TABLET BY MOUTH EVERY DAY 30 tablet 1  . Multiple Vitamins-Minerals (MULTIVITAMIN WITH MINERALS) tablet Take 1 tablet by mouth daily.    . mupirocin ointment (BACTROBAN) 2 % Apply topically 2 (two) times daily as needed. 30 g 0  .  pantoprazole (PROTONIX) 40 MG tablet Take 1 tablet (40 mg total) by mouth daily with lunch. 90 tablet 2  . pregabalin (LYRICA) 150 MG capsule Take 1 capsule (150 mg total) by mouth 2 (two) times daily. 60 capsule 5   No current facility-administered medications on file prior to visit.     ALLERGIES: Allergies  Allergen Reactions  . Bee Venom Anaphylaxis  . Influenza Vaccines Other (See Comments)    Sick for months    FAMILY HISTORY: Family History  Problem Relation Age of Onset  . Heart attack Mother 57  . Heart disease Mother   . Stroke Mother   . Hyperlipidemia Mother   . Hypertension Mother   .  Colon cancer Neg Hx   . Prostate cancer Neg Hx   . COPD Father   . Peripheral vascular disease Father   . Diabetes Brother   . Heart disease Brother   . Hypertension Brother   . Heart attack Brother   . Diabetes Daughter     SOCIAL HISTORY: Social History   Social History  . Marital status: Married    Spouse name: N/A  . Number of children: 2  . Years of education: N/A   Occupational History  . disable Retired   Social History Main Topics  . Smoking status: Former Smoker    Years: 50.00    Quit date: 07/22/2012  . Smokeless tobacco: Current User     Comment: pt states that he is using E-cigs  . Alcohol use No  . Drug use: No  . Sexual activity: No     Comment: electronic cigarettes no nicotene   Other Topics Concern  . Not on file   Social History Narrative   Lives w/ wife    REVIEW OF SYSTEMS: Constitutional: No fevers, chills, or sweats, no generalized fatigue, change in appetite Eyes: No visual changes, double vision, eye pain Ear, nose and throat: as above Cardiovascular: No chest pain, palpitations Respiratory:  No shortness of breath at rest or with exertion, wheezes GastrointestinaI: No nausea, vomiting, diarrhea, abdominal pain, fecal incontinence Genitourinary:  No dysuria, urinary retention or frequency Musculoskeletal:  No neck pain, back  pain Integumentary: No rash, pruritus, skin lesions Neurological: as above Psychiatric: No depression, insomnia, anxiety Endocrine: No palpitations, fatigue, diaphoresis, mood swings, change in appetite, change in weight, increased thirst Hematologic/Lymphatic:  No purpura, petechiae. Allergic/Immunologic: no itchy/runny eyes, nasal congestion, recent allergic reactions, rashes  PHYSICAL EXAM: Vitals:   03/06/16 1109  BP: (!) 166/68  Pulse: 60   General: No acute distress.  Patient appears well-groomed. Head:  Normocephalic/atraumatic Eyes:  Fundi examined but not visualized Neck: supple, no paraspinal tenderness, full range of motion Heart:  Regular rate and rhythm Lungs:  Clear to auscultation bilaterally Back: No paraspinal tenderness Neurological Exam: alert and oriented to person and place but not time. Attention span and concentration intact, delayed recall intact, remote memory intact, fund of knowledge intact.  Speech fluent and not dysarthric, language intact.  CN II-XII intact. Fundi not able to be visualized on inspection.  Mildly inncreased tone in upper extremities, 5-/5 right triceps, otherwise muscle strength 5/5 in upper extremities.  Sensation to light touch iin hands and fingers intact.  Deep tendon reflexes 2+ in upper extremities.  Finger to nose intact.   IMPRESSION: Secondary progressive MS Phantom limb pain Mild cognitive impairment Dysphagia HTN  PLAN: 1.  Continue Lyrica 150mg  twice daily.  For breakthrough pain, I will prescribe tramadol.  Discussed potential drug interactions, including possibly lowering seizure threshold.   2.  Baclofen for spasticity. 3.  Continue vitamin D.   4.  Will check D, B12 and TSH 5.  Will refer for modified barium swallow 6.  We discussed possibly starting starting a cholinesterase inhibitor for memory but explained that there is no convincing data to support pharmacotherapy.  They would like to hold off on starting any new  medication. 7.  Blood pressure elevated today.  Should be followed up with PCP. 8.  Follow up in 9 months.  27 minutes spent face to face with patient, over 50% spent counseling.  Metta Clines, DO  CC:  Billey Gosling, MD

## 2016-03-07 ENCOUNTER — Other Ambulatory Visit: Payer: Self-pay | Admitting: Neurology

## 2016-03-07 ENCOUNTER — Telehealth: Payer: Self-pay

## 2016-03-07 NOTE — Telephone Encounter (Signed)
Message left for pt to check mychart message or to return call back to office.

## 2016-03-07 NOTE — Telephone Encounter (Signed)
Mychart message sent. Will call after lunch if I've not heard yet.

## 2016-03-07 NOTE — Telephone Encounter (Signed)
-----   Message from Pieter Partridge, DO sent at 03/07/2016  7:10 AM EDT ----- B12 level is in the low-normal range but I would like it a bit higher.  Recommend increasing D3 from 2000 IU to 4000 IU and retest in 6 months.

## 2016-03-08 ENCOUNTER — Other Ambulatory Visit: Payer: Medicare Other

## 2016-03-08 ENCOUNTER — Telehealth: Payer: Self-pay

## 2016-03-08 DIAGNOSIS — R1310 Dysphagia, unspecified: Secondary | ICD-10-CM

## 2016-03-08 NOTE — Telephone Encounter (Signed)
Hospital called about barium swallow order. States that for dysphasia they normally do a modified barium swallow. Okay to change order? Please advise.   [phone]

## 2016-03-08 NOTE — Telephone Encounter (Signed)
Yes, I meant modified barium swallow.  Thank you

## 2016-03-08 NOTE — Telephone Encounter (Signed)
Order updated

## 2016-03-11 ENCOUNTER — Other Ambulatory Visit: Payer: Self-pay | Admitting: Neurology

## 2016-03-11 DIAGNOSIS — R1310 Dysphagia, unspecified: Secondary | ICD-10-CM

## 2016-03-20 ENCOUNTER — Ambulatory Visit (HOSPITAL_COMMUNITY)
Admission: RE | Admit: 2016-03-20 | Discharge: 2016-03-20 | Disposition: A | Discharge status: Home / Self Care | Payer: Medicare Other | Source: Ambulatory Visit | Attending: Neurology | Admitting: Neurology

## 2016-03-20 DIAGNOSIS — R1310 Dysphagia, unspecified: Secondary | ICD-10-CM

## 2016-03-20 DIAGNOSIS — G35 Multiple sclerosis: Secondary | ICD-10-CM | POA: Diagnosis not present

## 2016-03-26 ENCOUNTER — Telehealth: Payer: Self-pay

## 2016-03-26 NOTE — Telephone Encounter (Signed)
Result sent via mychart.   

## 2016-03-26 NOTE — Telephone Encounter (Signed)
-----   Message from Pieter Partridge, DO sent at 03/25/2016 11:06 AM EDT ----- The swallowing test looked okay

## 2016-04-10 ENCOUNTER — Other Ambulatory Visit (INDEPENDENT_AMBULATORY_CARE_PROVIDER_SITE_OTHER): Payer: Medicare Other

## 2016-04-10 ENCOUNTER — Ambulatory Visit (INDEPENDENT_AMBULATORY_CARE_PROVIDER_SITE_OTHER): Payer: Medicare Other | Admitting: Internal Medicine

## 2016-04-10 ENCOUNTER — Encounter: Payer: Self-pay | Admitting: Internal Medicine

## 2016-04-10 VITALS — BP 116/70 | HR 60 | Temp 98.0°F | Resp 16

## 2016-04-10 DIAGNOSIS — G546 Phantom limb syndrome with pain: Secondary | ICD-10-CM

## 2016-04-10 DIAGNOSIS — I255 Ischemic cardiomyopathy: Secondary | ICD-10-CM | POA: Diagnosis not present

## 2016-04-10 DIAGNOSIS — I2510 Atherosclerotic heart disease of native coronary artery without angina pectoris: Secondary | ICD-10-CM

## 2016-04-10 DIAGNOSIS — I10 Essential (primary) hypertension: Secondary | ICD-10-CM

## 2016-04-10 DIAGNOSIS — Z89611 Acquired absence of right leg above knee: Secondary | ICD-10-CM

## 2016-04-10 DIAGNOSIS — E1159 Type 2 diabetes mellitus with other circulatory complications: Secondary | ICD-10-CM

## 2016-04-10 DIAGNOSIS — K219 Gastro-esophageal reflux disease without esophagitis: Secondary | ICD-10-CM | POA: Insufficient documentation

## 2016-04-10 DIAGNOSIS — Z89612 Acquired absence of left leg above knee: Secondary | ICD-10-CM

## 2016-04-10 DIAGNOSIS — G35 Multiple sclerosis: Secondary | ICD-10-CM

## 2016-04-10 LAB — LIPID PANEL
CHOL/HDL RATIO: 3
CHOLESTEROL: 92 mg/dL (ref 0–200)
HDL: 30.4 mg/dL — AB (ref >39.00)
LDL CALC: 53 mg/dL (ref 0–99)
NonHDL: 62
Triglycerides: 45 mg/dL (ref 0.0–149.0)
VLDL: 9 mg/dL (ref 0.0–40.0)

## 2016-04-10 LAB — COMPREHENSIVE METABOLIC PANEL
ALK PHOS: 52 U/L (ref 39–117)
ALT: 18 U/L (ref 0–53)
AST: 16 U/L (ref 0–37)
Albumin: 3.6 g/dL (ref 3.5–5.2)
BUN: 15 mg/dL (ref 6–23)
CO2: 28 meq/L (ref 19–32)
Calcium: 8.9 mg/dL (ref 8.4–10.5)
Chloride: 108 mEq/L (ref 96–112)
Creatinine, Ser: 0.56 mg/dL (ref 0.40–1.50)
GFR: 183.37 mL/min (ref >60.00)
GLUCOSE: 81 mg/dL (ref 70–99)
POTASSIUM: 3.8 meq/L (ref 3.5–5.1)
Sodium: 141 mEq/L (ref 135–145)
Total Bilirubin: 0.8 mg/dL (ref 0.2–1.2)
Total Protein: 7.1 g/dL (ref 6.0–8.3)

## 2016-04-10 LAB — CBC WITH DIFFERENTIAL/PLATELET
BASOS PCT: 0.6 % (ref 0.0–3.0)
Basophils Absolute: 0 10*3/uL (ref 0.0–0.1)
EOS PCT: 6.4 % — AB (ref 0.0–5.0)
Eosinophils Absolute: 0.4 10*3/uL (ref 0.0–0.7)
HCT: 42.3 % (ref 39.0–52.0)
Hemoglobin: 13.8 g/dL (ref 13.0–17.0)
LYMPHS ABS: 2.1 10*3/uL (ref 0.7–4.0)
Lymphocytes Relative: 34.9 % (ref 12.0–46.0)
MCHC: 32.8 g/dL (ref 30.0–36.0)
MCV: 81.2 fl (ref 78.0–100.0)
MONOS PCT: 7.9 % (ref 3.0–12.0)
Monocytes Absolute: 0.5 10*3/uL (ref 0.1–1.0)
NEUTROS ABS: 3.1 10*3/uL (ref 1.4–7.7)
NEUTROS PCT: 50.2 % (ref 43.0–77.0)
PLATELETS: 153 10*3/uL (ref 150.0–400.0)
RBC: 5.21 Mil/uL (ref 4.22–5.81)
RDW: 16.6 % — ABNORMAL HIGH (ref 11.5–15.5)
WBC: 6.1 10*3/uL (ref 4.0–10.5)

## 2016-04-10 LAB — HEMOGLOBIN A1C: Hgb A1c MFr Bld: 6.6 % — ABNORMAL HIGH (ref 4.6–6.5)

## 2016-04-10 NOTE — Assessment & Plan Note (Signed)
Sugars well controlled at home Taking low dose insulin check a1c today Continue current dose of insulin

## 2016-04-10 NOTE — Progress Notes (Signed)
Subjective:    Patient ID: Jaime [name], male    DOB: 05/08/1941, 75 y.o.   MRN: [id number]  HPI The patient is here for follow up.  He follows with cardiology once a year and neurology.    Multiple sclerosis:  He is following with neurology.  He is not on any medication for the MS.   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   He is taking baclofen for the spasms.   Chest congestion and throat congestion:  He has a horse voice and is having difficulty bringing phlegm up.  This is chronic. He had a swallowing evaluation recently and it was good. He has started mucinex and a cough medication and it has not helped.  His neurologist thought it was related to his MS>  He is also using the albuterol, astelin spray.  His GERD is controlled.   CAD, PAD, htn:  He sees cardiology once a year.  He is compliant with a low sodium diet.  He is taking all of his medications as prescribed.  He is smoking e-cigs.  He has had b/l AKA's.  He denies any chest pain, palpitations and shortness of breath.     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   Diabetes: He is taking his medication daily as prescribed. He is compliant with a diabetic diet. He is not exercising regularly. He monitors his sugars and they have been running 90-100.    Urinary catheter. He does follow with urology.   GERD:  He is taking his medication daily as prescribed.  He denies any GERD symptoms and feels his GERD is well controlled.     Medications and allergies reviewed with patient and updated if appropriate.  Patient Active Problem List   Diagnosis Date Noted  . Coronary artery disease involving native coronary artery of native heart without angina pectoris 12/28/2015  . PAD (peripheral artery disease) ([place]) 12/28/2015  . Shortness of breath 12/28/2015  . Bradycardia 08/14/2015  . Chronic combined systolic and diastolic CHF (congestive heart failure) ([place]) 08/14/2015  . Bronchitis  08/13/2015  . 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 Phantom limb pain ([place]) 12/08/2014  . Syncope 11/01/2014  . Diabetes mellitus type 2, controlled ([place]) 11/01/2014  . Abscess of abdominal cavity ([place])   . Gallbladder abscess   . Abdominal pain 10/31/2014  . S/P laparoscopic cholecystectomy 10/25/2014  . Choledocholithiasis with acute cholecystitis 08/25/2014  . Bile duct stone   . Choledocholithiasis   . Coronary arteriosclerosis   . Dyspnea   . Cholecystitis   . Dilated bile duct   . SOB (shortness of breath)   . Abnormal LFTs   . Sepsis ([place]) 08/18/2014  . UTI (lower urinary tract infection) 08/18/2014  . IDDM (insulin dependent diabetes mellitus) ([place]) 08/18/2014  . She is a seborrheic EKG didn't show much he says he's off a UA she has easy she's come in for chest pain rule out 08/18/2014  . Multiple sclerosis, secondary progressive ([place]) 06/10/2014  . Paraparesis of both lower limbs ([place]) 05/31/2014  . Chronic systolic CHF (congestive heart failure) ([place]) 05/30/2014  . Scoliosis, thoracogenic, acquired 03/15/2014  . Mild cognitive impairment 02/11/2014  . PVD (peripheral vascular disease) ([place]) 08/13/2013  . Pain in limb 08/13/2013  . Annual physical exam 05/12/2013  . S/P AKA (above knee amputation) bilateral ([place]) 08/03/2012  . Cough 07/21/2012  . PAC (premature atrial contraction)   . Peripheral  vascular disease, unspecified ([place]) 07/03/2012  . Phantom pain ([place]) 06/24/2012  . 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 Preop cardiovascular exam   . Atherosclerosis of native arteries of the extremities with ulceration(440.23) 04/17/2012  . Ejection fraction   . Cardiomyopathy, ischemic   . Pulmonary hypertension ([place])   . Urinary retention   . Hemiparesis ([place])   . Essential hypertension   . S/P AKA (above knee amputation) ([place])   . Urinary tract infection after immobility, UCX MRSA 03/26/2012  . Atherosclerotic  PVD with ulceration ([place]) 03/06/2012  . Other disorder of muscle, ligament, and fascia 12/13/2011  . CAD (coronary artery disease)   . Carotid artery disease ([place])   . SHOULDER PAIN 08/24/2010  . HYPERLIPIDEMIA 07/31/2007  . BPH (benign prostatic hyperplasia) 04/15/2007  . DMII (diabetes mellitus, type 2) ([place]) 12/18/2006  . Multiple sclerosis ([place]) 12/18/2006    Current Outpatient Prescriptions on File Prior to Visit  Medication Sig Dispense Refill  . albuterol (PROVENTIL HFA;VENTOLIN HFA) 108 (90 Base) MCG/ACT inhaler Inhale 2 puffs into the lungs every 6 (six) hours as needed for wheezing or shortness of breath. 1 Inhaler 2  . aspirin EC 325 MG tablet Take 325 mg by mouth daily.    Marland Kitchen atorvastatin (LIPITOR) 40 MG tablet Take 1 tablet (40 mg total) by mouth daily. (Patient taking differently: Take 40 mg by mouth daily at 6 PM. ) 30 tablet 5  . azelastine (ASTELIN) 0.1 % nasal spray Place 2 sprays into both nostrils at bedtime as needed for rhinitis. Use in each nostril as directed 30 mL 3  . baclofen (LIORESAL) 10 MG tablet Take 0.5 tablets (5 mg total) by mouth 2 (two) times daily. 30 tablet 4  . Cholecalciferol (VITAMIN D) 2000 units CAPS Take 1 capsule by mouth daily.     . diclofenac sodium (VOLTAREN) 1 % GEL Apply 1 application topically 2 (two) times daily as needed (pain). For pain    . glucagon (GLUCAGEN) 1 MG SOLR injection Inject 1 mg into the vein once as needed for low blood sugar. Reported on 11/16/2015    . guaiFENesin-dextromethorphan (ROBITUSSIN DM) 100-10 MG/5ML syrup Take 15 mLs by mouth every 4 (four) hours as needed for cough.    . insulin aspart protamine- aspart (NOVOLOG MIX 70/30) (70-30) 100 UNIT/ML injection Inject 0.05 mLs (5 Units total) into the skin 2 (two) times daily with a meal. 10 mL 0  . lisinopril (PRINIVIL,ZESTRIL) 10 MG tablet TAKE 1 TABLET BY MOUTH EVERY DAY 30 tablet 1  . Multiple Vitamins-Minerals (MULTIVITAMIN WITH MINERALS) tablet Take 1 tablet by  mouth daily.    . mupirocin ointment (BACTROBAN) 2 % Apply topically 2 (two) times daily as needed. 30 g 0  . pantoprazole (PROTONIX) 40 MG tablet Take 1 tablet (40 mg total) by mouth daily with lunch. 90 tablet 2  . pregabalin (LYRICA) 150 MG capsule Take 1 capsule (150 mg total) by mouth 2 (two) times daily. 60 capsule 5  . traMADol (ULTRAM) 50 MG tablet Take 1 tablet (50 mg total) by mouth every 6 (six) hours as needed. 30 tablet 1   No current facility-administered medications on file prior to visit.     Past Medical History:  Diagnosis Date  . Atherosclerotic PVD with ulceration ([place])    left foot  . CAD (coronary artery disease) CARDIOLOGIST- DR KATZ-- VISIT 06-05-2011 IN [facility]   non-STEMI, 2007.Marland Kitchenoccluded circumflex.. Taxus stent placed...residual 80% LAD...50% RCA  . Cardiomyopathy, ischemic  EF 45% per ECHO 2008  //   EF 25%, echo, August, 2013 //  Echo (8/15):  Mild LVH, EF 30-35%, ant-lat and lat AK, inf HK, Gr 1 DD, mild MR, mild LAE  . Carotid artery disease ([place])    a.  Doppler, February, 2012, 0-39% bilateral,Mild smooth plaque;  b.  Carotid US (8/15):  Bilateral 1-39% ICA >>> F/u 2 years  . Chronic indwelling Foley catheter   . Constipation   . Dementia   . Diabetes mellitus    INSULIN-DEPENDent  . Fatigue SEVERE  . H/O pleural effusion 2008   POST THORACENTESIS  . History of colon polyps PRECANCEROUS  . Hyperlipidemia   . Hypertension   . Impotence   . Increased prostate specific antigen (PSA) velocity   . Insomnia   . Lung nodule    resolved 11-2006 CT Chest  . Multiple sclerosis ([place]) [place] -- LAST VISIT 11-20-2010  NOTE W/ CHART  . PAC (premature atrial contraction)    December, 2013  . Pulmonary hypertension ([place])    moderate ECHO Jan 2008  . Scoliosis associated with other condition   . Sleep apnea   . Systolic heart failure   . TIA (transient ischemic attack)   . Tobacco abuse    quit   . Urinary retention    dx ~  2-12, like from [place], now with a catheter, saw urology  . Urinary tract infection    hx of    Past Surgical History:  Procedure Laterality Date  . ABDOMINAL ANGIOGRAM  12/17/2011   Procedure: ABDOMINAL ANGIOGRAM;  Surgeon: Serafina Mitchell, MD;  Location: [facility] CATH LAB;  Service: Cardiovascular;;  . ABDOMINAL AORTAGRAM N/A 08/19/2013   Procedure: ABDOMINAL Maxcine Ham;  Surgeon: Conrad Cassville, MD;  Location[facility] CATH LAB;  Service: Cardiovascular;  Laterality: N/A;  . AMPUTATION  03/17/2012   Procedure: AMPUTATION ABOVE KNEE;  Surgeon: Conrad Moquino, MD;  Location: [place];  Service: Vascular;  Laterality: Left;  . AMPUTATION  06/03/2012   Procedure: AMPUTATION ABOVE KNEE;  Surgeon: Conrad Orchards, MD;  Location: [place];  Service: Vascular;  Laterality: Right;  . CHOLECYSTECTOMY N/A 10/25/2014   Procedure: LAPAROSCOPIC CHOLECYSTECTOMY ;  Surgeon: Coralie Keens, MD;  Location: [place];  Service: General;  Laterality: N/A;  . CORONARY ANGIOPLASTY WITH STENT PLACEMENT  05-01-2006   OCCLUDED CIRCUMFLEX -- TAXUS STENT PLACMENT  AND RESIDUAL 80% LAD,  50% RCA  . CYSTOSCOPY  07/30/2011   Procedure: CYSTOSCOPY;  Surgeon: Hanley Ben, MD;  Location[facility];  Service: Urology;  Laterality: N/A;  . ERCP N/A 08/25/2014   Procedure: ENDOSCOPIC RETROGRADE CHOLANGIOPANCREATOGRAPHY (ERCP);  Surgeon: Ladene Artist, MD;  Location: [facility] ENDOSCOPY;  Service: Endoscopy;  Laterality: N/A;  . FEMORAL-POPLITEAL BYPASS GRAFT  03/11/2012   Procedure: BYPASS GRAFT FEMORAL-POPLITEAL ARTERY;  Surgeon: Conrad King George, MD;  Location: [place];  Service: Vascular;  Laterality: Left;  embolectomy left lower leg  . FEMORAL-TIBIAL BYPASS GRAFT  03/11/2012   Procedure: BYPASS GRAFT FEMORAL-TIBIAL ARTERY;  Surgeon: Conrad [place], MD;  Location: [facility] OR;  Service: Vascular;  Laterality: Left;  Left Femoral -Tibial trunk bypass, Endarterectomy of Tibial- Peroneal trunk with vein angioplasty.  Marland Kitchen HERNIA REPAIR  1990   (R)  .  INTRAOPERATIVE ARTERIOGRAM  03/11/2012   Procedure: INTRA OPERATIVE ARTERIOGRAM;  Surgeon: Conrad Raiford, MD;  Location: [place];  Service: Vascular;  Laterality: Left;  . LOWER EXTREMITY  ANGIOGRAM Bilateral 12/17/2011   Procedure: LOWER EXTREMITY ANGIOGRAM;  Surgeon: Serafina Mitchell, MD;  Location[facility] CATH LAB;  Service: Cardiovascular;  Laterality: Bilateral;  bil lower extrem angio  . THORACENTESIS  2008   PLEURAL EFFUSION  . TRANSURETHRAL RESECTION OF PROSTATE  07/30/2011   Procedure: TRANSURETHRAL RESECTION OF THE PROSTATE (TURP);  Surgeon: Hanley Ben, MD;  Location: [facility];  Service: Urology;  Laterality: N/A;    Social History   Social History  . Marital status: Married    Spouse name: N/A  . Number of children: 2  . Years of education: N/A   Occupational History  . disable Retired   Social History Main Topics  . Smoking status: Former Smoker    Years: 50.00    Quit date: 07/22/2012  . Smokeless tobacco: Current User     Comment: pt states that he is using E-cigs  . Alcohol use No  . Drug use: No  . Sexual activity: No     Comment: electronic cigarettes no nicotene   Other Topics Concern  . Not on file   Social History Narrative   Lives w/ wife    Family History  Problem Relation Age of Onset  . Heart attack Mother 65  . Heart disease Mother   . Stroke Mother   . Hyperlipidemia Mother   . Hypertension Mother   . Colon cancer Neg Hx   . Prostate cancer Neg Hx   . COPD Father   . Peripheral vascular disease Father   . Diabetes Brother   . Heart disease Brother   . Hypertension Brother   . Heart attack Brother   . Diabetes Daughter     Review of Systems  Constitutional: Negative for appetite change, chills and fever.  Eyes: Negative for visual disturbance.  Respiratory: Positive for cough and wheezing (occasional). Negative for shortness of breath.   Cardiovascular: Negative for chest pain and palpitations.  Gastrointestinal: Negative  for abdominal pain, constipation and diarrhea.       Gerd controlle  Genitourinary:       No discoloration, no odor  Neurological: Positive for dizziness (with rolling over in bed) and headaches (occasional). Negative for light-headedness.  Psychiatric/Behavioral: Negative for dysphoric mood. The patient is not nervous/anxious.        Objective:   Vitals:   04/10/16 1002  BP: 116/70  Pulse: 60  Resp: 16  Temp: 98 F (36.7 C)   Filed Weights   There is no height or weight on file to calculate BMI.   Physical Exam    Constitutional: Appears well-developed and well-nourished. No distress.  HENT:  Head: Normocephalic and atraumatic.  Neck: Neck supple. No tracheal deviation present. No thyromegaly present.  Cardiovascular: Normal rate, regular rhythm and normal heart sounds.   2/6 systolic murmur heard. No carotid bruit   No edema Pulmonary/Chest: Effort normal.  No respiratory distress. No has no wheezes. Mild dry crackles at bases  Musculoskeletal: stumps without edema, skin breakdown or erythema  Lymphadenopathy: No cervical adenopathy.  Skin: Skin is warm and dry. Not diaphoretic.  Psychiatric: Normal mood and affect. Behavior is normal.     Assessment & Plan:    See Problem List for Assessment and Plan of chronic medical problems.   F/u in 6 months

## 2016-04-10 NOTE — Patient Instructions (Signed)

## 2016-04-10 NOTE — Assessment & Plan Note (Signed)
GERD controlled Continue daily medication  

## 2016-04-10 NOTE — Assessment & Plan Note (Signed)
Taking Lyrica, tramadol, baclofen

## 2016-04-10 NOTE — Progress Notes (Signed)
Pre visit review using our clinic review tool, if applicable. No additional management support is needed unless otherwise documented below in the visit note. 

## 2016-04-19 ENCOUNTER — Telehealth: Payer: Self-pay | Admitting: Cardiology

## 2016-04-19 DIAGNOSIS — I5022 Chronic systolic (congestive) heart failure: Secondary | ICD-10-CM

## 2016-04-19 DIAGNOSIS — I739 Peripheral vascular disease, unspecified: Secondary | ICD-10-CM

## 2016-04-19 DIAGNOSIS — I2510 Atherosclerotic heart disease of native coronary artery without angina pectoris: Secondary | ICD-10-CM

## 2016-04-19 DIAGNOSIS — I5042 Chronic combined systolic (congestive) and diastolic (congestive) heart failure: Secondary | ICD-10-CM

## 2016-04-19 MED ORDER — FUROSEMIDE 40 MG PO TABS: 40.0000 mg | ORAL_TABLET | ORAL | 0 refills | Status: DC

## 2016-04-19 NOTE — Telephone Encounter (Signed)
°  New Prob   Pt c/o swelling: STAT is pt has developed SOB within 24 hours  1. How long have you been experiencing swelling?  3 days  2. Where is the swelling located?  R lower extremity   3.  Are you currently taking a "fluid pill"? No  4.  Are you currently SOB?  No  5.  Have you traveled recently? No

## 2016-04-19 NOTE — Telephone Encounter (Signed)
SPOKE WITH PT'S WIFE   AND  PT  IS  COMPLAINING WITH    THIGH PAIN  AND  SWELLING  PT IS  BILATERAL   ABOVE  THE KNEE  AMPUTEE HAS NOTED   OFF AND  ON  SWELLING  FOR  COUPLE  OF MONTHS  DISCUSSED  WITH  DR  ROSS  PT  MAY  TAKE  FUROSEMIDE  40 MG   Saturday  AND  TO CALL Monday  WITH UPDATE   MY NEED  TO  SCHEDULE LOWER  EXTREMITY  VENOUS  ULTRASOUND  WILL FORWARD  TO DR Meda Coffee  .Adonis Housekeeper

## 2016-04-22 MED ORDER — FUROSEMIDE 20 MG PO TABS: 20.0000 mg | ORAL_TABLET | Freq: Every day | ORAL | 3 refills | Status: DC

## 2016-04-22 NOTE — Telephone Encounter (Signed)
Have him start lasix 20 mg po daily and check his BMP in 3 weeks. Thank you

## 2016-04-22 NOTE — Telephone Encounter (Signed)
Spoke with the pts spouse, who is his primary caregiver (on [place]). Informed the pt's wife that per Dr Meda Coffee, she recommends that the pt start taking lasix 20 mg po daily, and recheck a bmet in 3 weeks.  Confirmed the pharmacy of choice with the pts spouse.  Scheduled for the pt to have lab drawn, to check a bmet,  for 05/15/16, as wife can get him here on that day.  Wife verbalized understanding, agrees with this plan, and very gracious for all the assistance provided.

## 2016-04-22 NOTE — Telephone Encounter (Signed)
Follow Up   Jaime [name] is calling because the Lasix has seemed to work for [employer] , he not in the pain , it has eased up . The medication has helped

## 2016-04-22 NOTE — Addendum Note (Signed)
Addended by: Nuala Alpha on: 04/22/2016 06:21 PM   Modules accepted: Orders

## 2016-04-22 NOTE — Telephone Encounter (Signed)
Will route this report and update from the pt to Dr Meda Coffee as an Juluis Rainier.  Will follow-up accordingly, if Dr Meda Coffee feels that the pt needs further testing.

## 2016-04-26 ENCOUNTER — Other Ambulatory Visit: Payer: Self-pay | Admitting: Internal Medicine

## 2016-05-15 ENCOUNTER — Other Ambulatory Visit: Payer: Medicare Other | Admitting: *Deleted

## 2016-05-15 DIAGNOSIS — I5022 Chronic systolic (congestive) heart failure: Secondary | ICD-10-CM | POA: Diagnosis not present

## 2016-05-15 DIAGNOSIS — I739 Peripheral vascular disease, unspecified: Secondary | ICD-10-CM

## 2016-05-15 DIAGNOSIS — I5042 Chronic combined systolic (congestive) and diastolic (congestive) heart failure: Secondary | ICD-10-CM | POA: Diagnosis not present

## 2016-05-15 DIAGNOSIS — I2510 Atherosclerotic heart disease of native coronary artery without angina pectoris: Secondary | ICD-10-CM

## 2016-05-15 LAB — BASIC METABOLIC PANEL
BUN: 16 mg/dL (ref 7–25)
CO2: 22 mmol/L (ref 20–31)
Calcium: 8.7 mg/dL (ref 8.6–10.3)
Chloride: 103 mmol/L (ref 98–110)
Creat: 0.71 mg/dL (ref 0.70–1.18)
Glucose, Bld: 136 mg/dL — ABNORMAL HIGH (ref 65–99)
Potassium: 4.3 mmol/L (ref 3.5–5.3)
Sodium: 136 mmol/L (ref 135–146)

## 2016-05-17 ENCOUNTER — Telehealth: Payer: Self-pay | Admitting: Cardiology

## 2016-05-17 NOTE — Telephone Encounter (Signed)
°  Follow Up ° ° °Pt is calling to follow up on blood work results. Please call. °

## 2016-05-17 NOTE — Telephone Encounter (Signed)
Called patient with lab results. Per Dr. Meda Coffee, normal electrolytes and creatinine, elevated glucose. Patient verbalized understanding.

## 2016-06-06 ENCOUNTER — Ambulatory Visit (INDEPENDENT_AMBULATORY_CARE_PROVIDER_SITE_OTHER): Payer: Medicare Other | Admitting: Neurology

## 2016-06-06 ENCOUNTER — Encounter: Payer: Self-pay | Admitting: Neurology

## 2016-06-06 VITALS — BP 104/58 | HR 70

## 2016-06-06 DIAGNOSIS — I255 Ischemic cardiomyopathy: Secondary | ICD-10-CM | POA: Diagnosis not present

## 2016-06-06 DIAGNOSIS — G546 Phantom limb syndrome with pain: Secondary | ICD-10-CM | POA: Diagnosis not present

## 2016-06-06 DIAGNOSIS — G35 Multiple sclerosis: Secondary | ICD-10-CM | POA: Diagnosis not present

## 2016-06-06 MED ORDER — TRAMADOL HCL 50 MG PO TABS: 50.0000 mg | ORAL_TABLET | Freq: Four times a day (QID) | ORAL | 4 refills | Status: DC | PRN

## 2016-06-06 NOTE — Patient Instructions (Addendum)
Continue Lyrica 150mg  twice daily Take tramadol as needed Take baclofen as needed Take vitamin D Follow up in one year

## 2016-06-06 NOTE — Progress Notes (Signed)
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  He is accompanied by his wife and daughter who provide some history.     UPDATE: He is feeling well. For pain, he takes Lyrica 150mg  twice daily.  He takes tramadol for breakthrough pain.  He also takes baclofen 5mg  twice daily. He also takes D3.   TSH 1.45, D 33.27, B12 884. For dysphagia, he underwent modified barium swallow, which revealed no abnormalities.   HISTORY: He was diagnosed with MS in 1993.  Initially, he developed right arm and leg weakness along with balance problems.  Symptoms initially improved but had progressed over the years.  He was on multiple immunomodulating agents such as Betaseron, Copaxone, Cytoxin and monthly Solumedrol.  He currently is not on an agent and hasn't had a flare up in 4 years.  He has bilateral ATK ambutations performed in 2013 as a consequence of his peripheral vascular and diabetes.  He requires assistance with bathing and dressing, but he is able to feed himself.  He uses a power wheelchair.  He had been on amantadine for many years.   MRI Brain w/wo from 11/29/04 demonstrated "extensive white matter disease in the supratentorial compartment, which is consistent with advanced multiple sclerosis.  There is confluence of the white matter lesions with atrophy and hydrocephalus, thinned corpus callosum, and black holes, however somewhat unusual for MS of this degree, no infrantentorial lesions are seen and no active lesions are identified by means of contrast." CT of head was performed on 03/23/12 for encephalopathy, which revealed atrophy and nonspecific small vessel ischemic changes.   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  He has some  poor short-term memory. Sometimes he calls his wife by his mother's name.  He cannot always remember the names of his great grandchildren.  He has more difficulty figuring out how to maneuver the motorized wheelchair.   Prior medication:  Gabapentin was ineffective.  Amantadine was discontinued due to nightmares.   He has [place].  He is unable to sit due to pain and often is laying in bed.  He is frequently turned to avoid pressure ulcers.  PAST MEDICAL HISTORY: Past Medical History:  Diagnosis Date  . Atherosclerotic PVD with ulceration ([place])    left foot  . CAD (coronary artery disease) CARDIOLOGIST- DR KATZ-- VISIT 06-05-2011 IN [facility]   non-STEMI, 2007.Marland Kitchenoccluded circumflex.. Taxus stent placed...residual 80% LAD...50% RCA  . Cardiomyopathy, ischemic    EF 45% per ECHO 2008  //   EF 25%, echo, August, 2013 //  Echo (8/15):  Mild LVH, EF 30-35%, ant-lat and lat AK, inf HK, Gr 1 DD, mild MR, mild LAE  . Carotid artery disease ([place])    a.  Doppler, February, 2012, 0-39% bilateral,Mild smooth plaque;  b.  Carotid US (8/15):  Bilateral 1-39% ICA >>> F/u 2 years  . Chronic indwelling Foley catheter   . Constipation   . Dementia   . Diabetes mellitus    INSULIN-DEPENDent  . Fatigue SEVERE  . H/O pleural effusion 2008   POST THORACENTESIS  . History of colon polyps PRECANCEROUS  . Hyperlipidemia   . Hypertension   . Impotence   . Increased prostate specific antigen (PSA) velocity   . Insomnia   .  Lung nodule    resolved 11-2006 CT Chest  . Multiple sclerosis ([place]) [place] -- LAST VISIT 11-20-2010  NOTE W/ CHART  . PAC (premature atrial contraction)    December, 2013  . Pulmonary hypertension    moderate ECHO Jan 2008  . Scoliosis associated with other condition   . Sleep apnea   . Systolic heart failure   . TIA (transient ischemic attack)   . Tobacco abuse    quit   . Urinary retention    dx ~ 2-12, like from [place], now with a catheter, saw  urology  . Urinary tract infection    hx of    MEDICATIONS: Current Outpatient Prescriptions on File Prior to Visit  Medication Sig Dispense Refill  . albuterol (PROVENTIL HFA;VENTOLIN HFA) 108 (90 Base) MCG/ACT inhaler Inhale 2 puffs into the lungs every 6 (six) hours as needed for wheezing or shortness of breath. 1 Inhaler 2  . aspirin EC 325 MG tablet Take 325 mg by mouth daily.    Marland Kitchen atorvastatin (LIPITOR) 40 MG tablet Take 1 tablet (40 mg total) by mouth daily. (Patient taking differently: Take 40 mg by mouth daily at 6 PM. ) 30 tablet 5  . azelastine (ASTELIN) 0.1 % nasal spray Place 2 sprays into both nostrils at bedtime as needed for rhinitis. Use in each nostril as directed 30 mL 3  . baclofen (LIORESAL) 10 MG tablet Take 0.5 tablets (5 mg total) by mouth 2 (two) times daily. 30 tablet 4  . Cholecalciferol (VITAMIN D) 2000 units CAPS Take 1 capsule by mouth daily.     . diclofenac sodium (VOLTAREN) 1 % GEL Apply 1 application topically 2 (two) times daily as needed (pain). For pain    . furosemide (LASIX) 20 MG tablet Take 1 tablet (20 mg total) by mouth daily. 90 tablet 3  . glucagon (GLUCAGEN) 1 MG SOLR injection Inject 1 mg into the vein once as needed for low blood sugar. Reported on 11/16/2015    . guaiFENesin-dextromethorphan (ROBITUSSIN DM) 100-10 MG/5ML syrup Take 15 mLs by mouth every 4 (four) hours as needed for cough.    . insulin aspart protamine- aspart (NOVOLOG MIX 70/30) (70-30) 100 UNIT/ML injection Inject 0.05 mLs (5 Units total) into the skin 2 (two) times daily with a meal. 10 mL 0  . lisinopril (PRINIVIL,ZESTRIL) 10 MG tablet TAKE 1 TABLET BY MOUTH EVERY DAY 30 tablet 5  . Multiple Vitamins-Minerals (MULTIVITAMIN WITH MINERALS) tablet Take 1 tablet by mouth daily.    . mupirocin ointment (BACTROBAN) 2 % Apply topically 2 (two) times daily as needed. 30 g 0  . pantoprazole (PROTONIX) 40 MG tablet Take 1 tablet (40 mg total) by mouth daily with lunch. 90 tablet 2  .  pregabalin (LYRICA) 150 MG capsule Take 1 capsule (150 mg total) by mouth 2 (two) times daily. 60 capsule 5  . traMADol (ULTRAM) 50 MG tablet Take 1 tablet (50 mg total) by mouth every 6 (six) hours as needed. 30 tablet 1   No current facility-administered medications on file prior to visit.     ALLERGIES: Allergies  Allergen Reactions  . Bee Venom Anaphylaxis  . Influenza Vaccines Other (See Comments)    Sick for months    FAMILY HISTORY: Family History  Problem Relation Age of Onset  . Heart attack Mother 49  . Heart disease Mother   . Stroke Mother   . Hyperlipidemia Mother   . Hypertension Mother   .  COPD Father   . Peripheral vascular disease Father   . Diabetes Brother   . Heart disease Brother   . Hypertension Brother   . Heart attack Brother   . Diabetes Daughter   . Colon cancer Neg Hx   . Prostate cancer Neg Hx     SOCIAL HISTORY: Social History   Social History  . Marital status: Married    Spouse name: N/A  . Number of children: 2  . Years of education: N/A   Occupational History  . disable Retired   Social History Main Topics  . Smoking status: Former Smoker    Years: 50.00    Quit date: 07/22/2012  . Smokeless tobacco: Current User     Comment: pt states that he is using E-cigs  . Alcohol use No  . Drug use: No  . Sexual activity: No     Comment: electronic cigarettes no nicotene   Other Topics Concern  . Not on file   Social History Narrative   Lives w/ wife    REVIEW OF SYSTEMS: Constitutional: No fevers, chills, or sweats, no generalized fatigue, change in appetite Eyes: No visual changes, double vision, eye pain Ear, nose and throat: No hearing loss, ear pain, nasal congestion, sore throat Cardiovascular: No chest pain, palpitations Respiratory:  No shortness of breath at rest or with exertion, wheezes GastrointestinaI: No nausea, vomiting, diarrhea, abdominal pain, fecal incontinence Genitourinary:  No dysuria, urinary retention  or frequency Musculoskeletal:  No neck pain, back pain Integumentary: No rash, pruritus, skin lesions Neurological: as above Psychiatric: No depression, insomnia, anxiety Endocrine: No palpitations, fatigue, diaphoresis, mood swings, change in appetite, change in weight, increased thirst Hematologic/Lymphatic:  No purpura, petechiae. Allergic/Immunologic: no itchy/runny eyes, nasal congestion, recent allergic reactions, rashes  PHYSICAL EXAM: Vitals:   06/06/16 1411  BP: (!) 104/58  Pulse: 70   General: No acute distress.  Patient appears well-groomed.  normal body habitus. Head:  Normocephalic/atraumatic Eyes:  Fundi examined but not visualized Neck: supple, no paraspinal tenderness, full range of motion Heart:  Regular rate and rhythm Lungs:  Clear to auscultation bilaterally Back: No paraspinal tenderness Neurological Exam: alert and oriented to person and place but not time. Attention span and concentration intact, delayed recall intact, remote memory intact, fund of knowledge intact.  Speech fluent and not dysarthric, language intact.  CN II-XII intact. Fundi not able to be visualized on inspection.  Mildly inncreased tone in upper extremities, 5-/5 right triceps, otherwise muscle strength 5/5 in upper extremities.  Sensation to light touch iin hands and fingers intact.  Deep tendon reflexes 2+ in upper extremities.  Finger to nose intact.  IMPRESSION: Secondary progressive MS Phantom limb pain  PLAN: Lyrica 150mg  twice daily Tramadol and baclofen as needed Vitamin D Follow up in one year  21 minutes spent face to face with patient, over 50% spent counseling.  Metta Clines, DO  CC:  Billey Gosling, MD

## 2016-06-10 ENCOUNTER — Telehealth: Payer: Self-pay | Admitting: Emergency Medicine

## 2016-06-10 DIAGNOSIS — R410 Disorientation, unspecified: Secondary | ICD-10-CM

## 2016-06-10 NOTE — Telephone Encounter (Signed)
LVM for pt's wife to call back. 

## 2016-06-10 NOTE — Telephone Encounter (Signed)
Pts wife called and patient is breaking out in spots. It is hard for patient to get into the office so she is wondering if she can bring a urine sample in. She is also wanting to know if she can get a refill on his pantoprazole (PROTONIX) 40 MG tablet and atorvastatin (LIPITOR) 40 MG tablet. Pharmacy is [facility] on [address]. Please advise thanks.

## 2016-06-11 MED ORDER — PANTOPRAZOLE SODIUM 40 MG PO TBEC: 40.0000 mg | DELAYED_RELEASE_TABLET | Freq: Every day | ORAL | 1 refills | Status: DC

## 2016-06-11 MED ORDER — ATORVASTATIN CALCIUM 40 MG PO TABS: 40.0000 mg | ORAL_TABLET | Freq: Every day | ORAL | 1 refills | Status: DC

## 2016-06-11 NOTE — Telephone Encounter (Signed)
Spoke with pts wife yesterday 06/10/16. Pt is not able to get into the office easily. Pts mental status seems to be a little off compared to usual. Wife noticed some discoloration to his urine.  Orders have been entered for UA and culture. Wife is to pick up urine cup and bring back to the lab  Refills sent to [facility]

## 2016-06-11 NOTE — Addendum Note (Signed)
Addended by: Terence Lux B on: 06/11/2016 12:53 PM   Modules accepted: Orders

## 2016-06-12 ENCOUNTER — Other Ambulatory Visit (INDEPENDENT_AMBULATORY_CARE_PROVIDER_SITE_OTHER): Payer: Medicare Other

## 2016-06-12 DIAGNOSIS — R410 Disorientation, unspecified: Secondary | ICD-10-CM | POA: Diagnosis not present

## 2016-06-12 LAB — URINALYSIS
Bilirubin Urine: NEGATIVE
HGB URINE DIPSTICK: NEGATIVE
KETONES UR: NEGATIVE
LEUKOCYTES UA: NEGATIVE
Nitrite: NEGATIVE
Total Protein, Urine: NEGATIVE
URINE GLUCOSE: NEGATIVE
UROBILINOGEN UA: 0.2 (ref 0.0–1.0)
pH: 6 (ref 5.0–8.0)

## 2016-06-13 LAB — URINE CULTURE: ORGANISM ID, BACTERIA: NO GROWTH

## 2016-06-21 ENCOUNTER — Ambulatory Visit: Payer: Medicare Other | Admitting: Neurology

## 2016-07-01 ENCOUNTER — Encounter (HOSPITAL_COMMUNITY): Payer: Self-pay | Admitting: Emergency Medicine

## 2016-07-01 ENCOUNTER — Emergency Department (HOSPITAL_COMMUNITY): Payer: Medicare Other

## 2016-07-01 ENCOUNTER — Inpatient Hospital Stay (HOSPITAL_COMMUNITY)
Admission: EM | Admit: 2016-07-01 | Discharge: 2016-07-06 | DRG: 871 | Disposition: A | Discharge status: Home Health | Payer: Medicare Other | Attending: Family Medicine | Admitting: Family Medicine

## 2016-07-01 DIAGNOSIS — I272 Pulmonary hypertension, unspecified: Secondary | ICD-10-CM | POA: Diagnosis present

## 2016-07-01 DIAGNOSIS — Z794 Long term (current) use of insulin: Secondary | ICD-10-CM

## 2016-07-01 DIAGNOSIS — Z791 Long term (current) use of non-steroidal anti-inflammatories (NSAID): Secondary | ICD-10-CM

## 2016-07-01 DIAGNOSIS — I5042 Chronic combined systolic (congestive) and diastolic (congestive) heart failure: Secondary | ICD-10-CM | POA: Diagnosis present

## 2016-07-01 DIAGNOSIS — I255 Ischemic cardiomyopathy: Secondary | ICD-10-CM | POA: Diagnosis present

## 2016-07-01 DIAGNOSIS — G4733 Obstructive sleep apnea (adult) (pediatric): Secondary | ICD-10-CM | POA: Diagnosis present

## 2016-07-01 DIAGNOSIS — R079 Chest pain, unspecified: Secondary | ICD-10-CM | POA: Diagnosis not present

## 2016-07-01 DIAGNOSIS — R579 Shock, unspecified: Secondary | ICD-10-CM | POA: Diagnosis not present

## 2016-07-01 DIAGNOSIS — G9341 Metabolic encephalopathy: Secondary | ICD-10-CM | POA: Diagnosis present

## 2016-07-01 DIAGNOSIS — Z8673 Personal history of transient ischemic attack (TIA), and cerebral infarction without residual deficits: Secondary | ICD-10-CM

## 2016-07-01 DIAGNOSIS — M419 Scoliosis, unspecified: Secondary | ICD-10-CM | POA: Diagnosis present

## 2016-07-01 DIAGNOSIS — I2510 Atherosclerotic heart disease of native coronary artery without angina pectoris: Secondary | ICD-10-CM | POA: Diagnosis present

## 2016-07-01 DIAGNOSIS — E1151 Type 2 diabetes mellitus with diabetic peripheral angiopathy without gangrene: Secondary | ICD-10-CM | POA: Diagnosis present

## 2016-07-01 DIAGNOSIS — F0390 Unspecified dementia without behavioral disturbance: Secondary | ICD-10-CM | POA: Diagnosis present

## 2016-07-01 DIAGNOSIS — Z823 Family history of stroke: Secondary | ICD-10-CM

## 2016-07-01 DIAGNOSIS — R52 Pain, unspecified: Secondary | ICD-10-CM | POA: Diagnosis not present

## 2016-07-01 DIAGNOSIS — Z6828 Body mass index (BMI) 28.0-28.9, adult: Secondary | ICD-10-CM

## 2016-07-01 DIAGNOSIS — I6529 Occlusion and stenosis of unspecified carotid artery: Secondary | ICD-10-CM | POA: Diagnosis present

## 2016-07-01 DIAGNOSIS — I110 Hypertensive heart disease with heart failure: Secondary | ICD-10-CM | POA: Diagnosis present

## 2016-07-01 DIAGNOSIS — F17290 Nicotine dependence, other tobacco product, uncomplicated: Secondary | ICD-10-CM | POA: Diagnosis present

## 2016-07-01 DIAGNOSIS — R310 Gross hematuria: Secondary | ICD-10-CM | POA: Diagnosis present

## 2016-07-01 DIAGNOSIS — E669 Obesity, unspecified: Secondary | ICD-10-CM | POA: Diagnosis present

## 2016-07-01 DIAGNOSIS — R6521 Severe sepsis with septic shock: Secondary | ICD-10-CM | POA: Diagnosis present

## 2016-07-01 DIAGNOSIS — G35 Multiple sclerosis: Secondary | ICD-10-CM | POA: Diagnosis present

## 2016-07-01 DIAGNOSIS — Z7982 Long term (current) use of aspirin: Secondary | ICD-10-CM

## 2016-07-01 DIAGNOSIS — N390 Urinary tract infection, site not specified: Secondary | ICD-10-CM | POA: Diagnosis present

## 2016-07-01 DIAGNOSIS — Z9989 Dependence on other enabling machines and devices: Secondary | ICD-10-CM

## 2016-07-01 DIAGNOSIS — B958 Unspecified staphylococcus as the cause of diseases classified elsewhere: Secondary | ICD-10-CM | POA: Diagnosis present

## 2016-07-01 DIAGNOSIS — B961 Klebsiella pneumoniae [K. pneumoniae] as the cause of diseases classified elsewhere: Secondary | ICD-10-CM | POA: Diagnosis not present

## 2016-07-01 DIAGNOSIS — Z89512 Acquired absence of left leg below knee: Secondary | ICD-10-CM

## 2016-07-01 DIAGNOSIS — Z89511 Acquired absence of right leg below knee: Secondary | ICD-10-CM

## 2016-07-01 DIAGNOSIS — L89152 Pressure ulcer of sacral region, stage 2: Secondary | ICD-10-CM | POA: Diagnosis present

## 2016-07-01 DIAGNOSIS — R7881 Bacteremia: Secondary | ICD-10-CM | POA: Diagnosis not present

## 2016-07-01 DIAGNOSIS — Z955 Presence of coronary angioplasty implant and graft: Secondary | ICD-10-CM

## 2016-07-01 DIAGNOSIS — A419 Sepsis, unspecified organism: Secondary | ICD-10-CM | POA: Diagnosis present

## 2016-07-01 DIAGNOSIS — Z79899 Other long term (current) drug therapy: Secondary | ICD-10-CM

## 2016-07-01 DIAGNOSIS — Z91030 Bee allergy status: Secondary | ICD-10-CM

## 2016-07-01 DIAGNOSIS — Z833 Family history of diabetes mellitus: Secondary | ICD-10-CM

## 2016-07-01 DIAGNOSIS — R4182 Altered mental status, unspecified: Secondary | ICD-10-CM | POA: Diagnosis not present

## 2016-07-01 DIAGNOSIS — R0789 Other chest pain: Secondary | ICD-10-CM | POA: Diagnosis not present

## 2016-07-01 DIAGNOSIS — I5022 Chronic systolic (congestive) heart failure: Secondary | ICD-10-CM | POA: Diagnosis not present

## 2016-07-01 DIAGNOSIS — I959 Hypotension, unspecified: Secondary | ICD-10-CM | POA: Diagnosis present

## 2016-07-01 DIAGNOSIS — E785 Hyperlipidemia, unspecified: Secondary | ICD-10-CM | POA: Diagnosis present

## 2016-07-01 DIAGNOSIS — Z8249 Family history of ischemic heart disease and other diseases of the circulatory system: Secondary | ICD-10-CM

## 2016-07-01 DIAGNOSIS — Z9049 Acquired absence of other specified parts of digestive tract: Secondary | ICD-10-CM

## 2016-07-01 DIAGNOSIS — Z887 Allergy status to serum and vaccine status: Secondary | ICD-10-CM

## 2016-07-01 DIAGNOSIS — Z79891 Long term (current) use of opiate analgesic: Secondary | ICD-10-CM

## 2016-07-01 DIAGNOSIS — N179 Acute kidney failure, unspecified: Secondary | ICD-10-CM | POA: Diagnosis present

## 2016-07-01 DIAGNOSIS — I10 Essential (primary) hypertension: Secondary | ICD-10-CM | POA: Diagnosis not present

## 2016-07-01 LAB — CBC WITH DIFFERENTIAL/PLATELET
Basophils Absolute: 0 10*3/uL (ref 0.0–0.1)
Basophils Relative: 0 %
EOS ABS: 0 10*3/uL (ref 0.0–0.7)
Eosinophils Relative: 0 %
HCT: 40.4 % (ref 39.0–52.0)
Hemoglobin: 13.7 g/dL (ref 13.0–17.0)
LYMPHS ABS: 2 10*3/uL (ref 0.7–4.0)
Lymphocytes Relative: 9 %
MCH: 27 pg (ref 26.0–34.0)
MCHC: 33.9 g/dL (ref 30.0–36.0)
MCV: 79.7 fL (ref 78.0–100.0)
MONO ABS: 1.3 10*3/uL — AB (ref 0.1–1.0)
MONOS PCT: 6 %
Neutro Abs: 19.1 10*3/uL — ABNORMAL HIGH (ref 1.7–7.7)
Neutrophils Relative %: 85 %
PLATELETS: 139 10*3/uL — AB (ref 150–400)
RBC: 5.07 MIL/uL (ref 4.22–5.81)
RDW: 17.3 % — AB (ref 11.5–15.5)
WBC: 22.4 10*3/uL — AB (ref 4.0–10.5)

## 2016-07-01 LAB — COMPREHENSIVE METABOLIC PANEL
ALT: 19 U/L (ref 17–63)
AST: 25 U/L (ref 15–41)
Albumin: 3.1 g/dL — ABNORMAL LOW (ref 3.5–5.0)
Alkaline Phosphatase: 49 U/L (ref 38–126)
Anion gap: 10 (ref 5–15)
BILIRUBIN TOTAL: 1.2 mg/dL (ref 0.3–1.2)
BUN: 29 mg/dL — AB (ref 6–20)
CALCIUM: 8.9 mg/dL (ref 8.9–10.3)
CO2: 21 mmol/L — ABNORMAL LOW (ref 22–32)
Chloride: 108 mmol/L (ref 101–111)
Creatinine, Ser: 1.23 mg/dL (ref 0.61–1.24)
GFR calc Af Amer: >60 mL/min (ref >60)
GFR, EST NON AFRICAN AMERICAN: 56 mL/min — AB (ref >60)
Glucose, Bld: 98 mg/dL (ref 65–99)
POTASSIUM: 3.7 mmol/L (ref 3.5–5.1)
Sodium: 139 mmol/L (ref 135–145)
TOTAL PROTEIN: 6.6 g/dL (ref 6.5–8.1)

## 2016-07-01 LAB — URINALYSIS, ROUTINE W REFLEX MICROSCOPIC
BILIRUBIN URINE: NEGATIVE
Glucose, UA: NEGATIVE mg/dL
KETONES UR: NEGATIVE mg/dL
Nitrite: NEGATIVE
PROTEIN: 100 mg/dL — AB
Specific Gravity, Urine: 1.019 (ref 1.005–1.030)
Squamous Epithelial / HPF: NONE SEEN
pH: 5 (ref 5.0–8.0)

## 2016-07-01 LAB — I-STAT ARTERIAL BLOOD GAS, ED
ACID-BASE DEFICIT: 6 mmol/L — AB (ref 0.0–2.0)
BICARBONATE: 18.4 mmol/L — AB (ref 20.0–28.0)
O2 Saturation: 95 %
TCO2: 19 mmol/L (ref 0–100)
pCO2 arterial: 33.6 mmHg (ref 32.0–48.0)
pH, Arterial: 7.347 — ABNORMAL LOW (ref 7.350–7.450)
pO2, Arterial: 77 mmHg — ABNORMAL LOW (ref 83.0–108.0)

## 2016-07-01 LAB — I-STAT BETA HCG BLOOD, ED (MC, WL, AP ONLY)

## 2016-07-01 LAB — LACTATE DEHYDROGENASE: LDH: 241 U/L — ABNORMAL HIGH (ref 98–192)

## 2016-07-01 LAB — BRAIN NATRIURETIC PEPTIDE: B NATRIURETIC PEPTIDE 5: 579.1 pg/mL — AB (ref 0.0–100.0)

## 2016-07-01 LAB — TROPONIN I: Troponin I: 0.32 ng/mL (ref <0.03)

## 2016-07-01 LAB — GLUCOSE, CAPILLARY: Glucose-Capillary: 75 mg/dL (ref 65–99)

## 2016-07-01 LAB — PROTIME-INR
INR: 1.27
PROTHROMBIN TIME: 16 s — AB (ref 11.4–15.2)

## 2016-07-01 LAB — I-STAT CG4 LACTIC ACID, ED: Lactic Acid, Venous: 1.8 mmol/L (ref 0.5–1.9)

## 2016-07-01 LAB — AMYLASE: Amylase: 50 U/L (ref 28–100)

## 2016-07-01 LAB — TSH: TSH: 0.233 u[IU]/mL — ABNORMAL LOW (ref 0.350–4.500)

## 2016-07-01 LAB — LIPASE, BLOOD: LIPASE: 15 U/L (ref 11–51)

## 2016-07-01 MED ORDER — SODIUM CHLORIDE 0.9 % IV SOLN
Freq: Once | INTRAVENOUS | Status: AC
  Administered 2016-07-01: 17:00:00 via INTRAVENOUS

## 2016-07-01 MED ORDER — VANCOMYCIN HCL 500 MG IV SOLR
500.0000 mg | Freq: Two times a day (BID) | INTRAVENOUS | Status: DC
  Administered 2016-07-02: 500 mg via INTRAVENOUS
  Filled 2016-07-01 (×2): qty 500

## 2016-07-01 MED ORDER — SODIUM CHLORIDE 0.9 % IV BOLUS (SEPSIS)
1000.0000 mL | Freq: Once | INTRAVENOUS | Status: AC
  Administered 2016-07-01: 1000 mL via INTRAVENOUS

## 2016-07-01 MED ORDER — FAMOTIDINE IN NACL 20-0.9 MG/50ML-% IV SOLN
20.0000 mg | Freq: Two times a day (BID) | INTRAVENOUS | Status: DC
  Administered 2016-07-02 (×2): 20 mg via INTRAVENOUS
  Filled 2016-07-01 (×3): qty 50

## 2016-07-01 MED ORDER — NOREPINEPHRINE BITARTRATE 1 MG/ML IV SOLN
2.0000 ug/min | INTRAVENOUS | Status: DC
  Filled 2016-07-01: qty 4

## 2016-07-01 MED ORDER — SODIUM CHLORIDE 0.9 % IV SOLN: 1000.0000 mL | INTRAVENOUS | Status: DC

## 2016-07-01 MED ORDER — PIPERACILLIN-TAZOBACTAM 3.375 G IVPB 30 MIN
3.3750 g | INTRAVENOUS | Status: AC
  Administered 2016-07-01: 3.375 g via INTRAVENOUS
  Filled 2016-07-01: qty 50

## 2016-07-01 MED ORDER — INSULIN ASPART 100 UNIT/ML ~~LOC~~ SOLN: 2.0000 [IU] | SUBCUTANEOUS | Status: DC

## 2016-07-01 MED ORDER — NOREPINEPHRINE BITARTRATE 1 MG/ML IV SOLN
0.0000 ug/min | INTRAVENOUS | Status: DC
  Filled 2016-07-01: qty 4

## 2016-07-01 MED ORDER — VANCOMYCIN HCL IN DEXTROSE 1-5 GM/200ML-% IV SOLN
1000.0000 mg | Freq: Once | INTRAVENOUS | Status: AC
  Administered 2016-07-01: 1000 mg via INTRAVENOUS
  Filled 2016-07-01: qty 200

## 2016-07-01 MED ORDER — NOREPINEPHRINE BITARTRATE 1 MG/ML IV SOLN
0.0000 ug/min | Freq: Once | INTRAVENOUS | Status: AC
  Administered 2016-07-01: 2 ug/min via INTRAVENOUS
  Filled 2016-07-01: qty 4

## 2016-07-01 MED ORDER — SODIUM CHLORIDE 0.9 % IV SOLN
INTRAVENOUS | Status: DC
  Administered 2016-07-01 – 2016-07-05 (×3): via INTRAVENOUS

## 2016-07-01 MED ORDER — PIPERACILLIN-TAZOBACTAM 3.375 G IVPB
3.3750 g | Freq: Three times a day (TID) | INTRAVENOUS | Status: DC
  Administered 2016-07-02 (×2): 3.375 g via INTRAVENOUS
  Filled 2016-07-01 (×3): qty 50

## 2016-07-01 MED ORDER — HEPARIN SODIUM (PORCINE) 5000 UNIT/ML IJ SOLN
5000.0000 [IU] | Freq: Three times a day (TID) | INTRAMUSCULAR | Status: DC
  Administered 2016-07-02 – 2016-07-06 (×13): 5000 [IU] via SUBCUTANEOUS
  Filled 2016-07-01 (×12): qty 1

## 2016-07-01 NOTE — ED Notes (Signed)
Myself and Josh, EMT performed rectal temperature on patient; visitors stepped out of room for privacy

## 2016-07-01 NOTE — ED Notes (Signed)
Blood draw QNS will notify nurse

## 2016-07-01 NOTE — H&P (Signed)
PULMONARY / CRITICAL CARE MEDICINE   Name: Jaime [name] MRN: [id number] DOB: [birth year]    ADMISSION DATE:  07/01/2016 CONSULTATION DATE:  07/01/2016  REFERRING MD:  Dr. Kathrynn Humble  CHIEF COMPLAINT:  AMS  HISTORY OF PRESENT ILLNESS:  75 year old male with past medical history as below, which is significant for multiple sclerosis, CAD, ischemic cardiomyopathy, diabetes mellitus,chronic systolic CHF with ejection fraction 35%, pulmonary hypertension, sleep apnea. He was in his usual state of health until 12/10 when he started to develop some weakness. Of note he does wear a condom catheter at baseline which was changed around that time by home health nurse. By 12/11 he became difficult to arouse so his wife called EMS. In the emergency department initially he was arousable and voiced no complaints. He was afebrile in the emergency department but was found to be hypotensive. He was felt to be dry by the EDP and was given 2 L of normal saline bolus. Despite this, he remained hypotensive and was started on Levophed for blood pressure support. PCCM to admit.   PAST MEDICAL HISTORY :  He  has a past medical history of Atherosclerotic PVD with ulceration ([place]); CAD (coronary artery disease) (CARDIOLOGIST- DR KATZ-- VISIT 06-05-2011 IN [facility]); Cardiomyopathy, ischemic; Carotid artery disease ([place]); Chronic indwelling Foley catheter; Constipation; Dementia; Diabetes mellitus; Fatigue ([place]); H/O pleural effusion (2008); History of colon polyps (PRECANCEROUS); Hyperlipidemia; Hypertension; Impotence; Increased prostate specific antigen (PSA) velocity; Insomnia; Lung nodule; Multiple sclerosis ([place]) (DX 1993); PAC (premature atrial contraction); Pulmonary hypertension; Scoliosis associated with other condition; Sleep apnea; Systolic heart failure; TIA (transient ischemic attack); Tobacco abuse; Urinary retention; and Urinary tract infection.  PAST SURGICAL HISTORY: He  has a past surgical history that  includes Thoracentesis (2008); Cystoscopy (07/30/2011); Transurethral resection of prostate (07/30/2011); Femoral-tibial Bypass Graft (03/11/2012); Intraoperative arteriogram (03/11/2012); Femoral-popliteal Bypass Graft (03/11/2012); Amputation (03/17/2012); Coronary angioplasty with stent (05-01-2006); Amputation (06/03/2012); lower extremity angiogram (Bilateral, 12/17/2011); abdominal angiogram (12/17/2011); abdominal aortagram (N/A, 08/19/2013); ERCP (N/A, 08/25/2014); Cholecystectomy (N/A, 10/25/2014); and Hernia repair (1990).  Allergies  Allergen Reactions  . Bee Venom Anaphylaxis  . Influenza Vaccines Other (See Comments)    Sick for months    No current facility-administered medications on file prior to encounter.    Current Outpatient Prescriptions on File Prior to Encounter  Medication Sig  . albuterol (PROVENTIL HFA;VENTOLIN HFA) 108 (90 Base) MCG/ACT inhaler Inhale 2 puffs into the lungs every 6 (six) hours as needed for wheezing or shortness of breath.  Marland Kitchen aspirin EC 325 MG tablet Take 325 mg by mouth daily.  Marland Kitchen atorvastatin (LIPITOR) 40 MG tablet Take 1 tablet (40 mg total) by mouth daily.  Marland Kitchen azelastine (ASTELIN) 0.1 % nasal spray Place 2 sprays into both nostrils at bedtime as needed for rhinitis. Use in each nostril as directed  . baclofen (LIORESAL) 10 MG tablet Take 0.5 tablets (5 mg total) by mouth 2 (two) times daily.  . Cholecalciferol (VITAMIN D) 2000 units CAPS Take 1 capsule by mouth daily.   . diclofenac sodium (VOLTAREN) 1 % GEL Apply 1 application topically 2 (two) times daily as needed (pain). For pain  . furosemide (LASIX) 20 MG tablet Take 1 tablet (20 mg total) by mouth daily.  Marland Kitchen glucagon (GLUCAGEN) 1 MG SOLR injection Inject 1 mg into the vein once as needed for low blood sugar. Reported on 11/16/2015  . insulin aspart protamine- aspart (NOVOLOG MIX 70/30) (70-30) 100 UNIT/ML injection Inject 0.05 mLs (5 Units total) into the skin 2 (two) times  daily with a meal.  . lisinopril  (PRINIVIL,ZESTRIL) 10 MG tablet TAKE 1 TABLET BY MOUTH EVERY DAY  . Multiple Vitamins-Minerals (MULTIVITAMIN WITH MINERALS) tablet Take 1 tablet by mouth daily.  . pantoprazole (PROTONIX) 40 MG tablet Take 1 tablet (40 mg total) by mouth daily with lunch.  . pregabalin (LYRICA) 150 MG capsule Take 1 capsule (150 mg total) by mouth 2 (two) times daily.  . traMADol (ULTRAM) 50 MG tablet Take 1 tablet (50 mg total) by mouth every 6 (six) hours as needed.  . mupirocin ointment (BACTROBAN) 2 % Apply topically 2 (two) times daily as needed. (Patient not taking: Reported on 07/01/2016)    FAMILY HISTORY:  His indicated that his mother is deceased. He indicated that his father is deceased. He indicated that the status of his brother is unknown. He indicated that the status of his daughter is unknown. He indicated that the status of his neg hx is unknown.    SOCIAL HISTORY: He  reports that he quit smoking about 3 years ago. He quit after 50.00 years of use. He uses smokeless tobacco. He reports that he does not drink alcohol or use drugs.  REVIEW OF SYSTEMS:   Limited due to encephalopathy  SUBJECTIVE:    VITAL SIGNS: BP 93/60   Pulse 65   Temp 99.2 F (37.3 C) (Rectal)   Resp 17   Wt 65.8 kg (145 lb)   SpO2 97%   BMI 28.32 kg/m   HEMODYNAMICS:    VENTILATOR SETTINGS:    INTAKE / OUTPUT: No intake/output data recorded.  PHYSICAL EXAMINATION: General: no distress, more awake Neuro:  Nonambulatory, moves upper ext, perr, fc well HEENT:  jvd none Cardiovascular:  s1 s2 RRR distant, int brady Lungs:  CTA Abdomen:  Soft, obese, NT, nD, nor /no g, no urine Musculoskeletal:  bilater amputation Skin:  Stage 2 sacrums, intact  LABS:  BMET  Recent Labs Lab 07/01/16 1722  NA 139  K 3.7  CL 108  CO2 21*  BUN 29*  CREATININE 1.23  GLUCOSE 98    Electrolytes  Recent Labs Lab 07/01/16 1722  CALCIUM 8.9    CBC  Recent Labs Lab 07/01/16 1722  WBC 22.4*  HGB  13.7  HCT 40.4  PLT 139*    Coag's  Recent Labs Lab 07/01/16 1722  INR 1.27    Sepsis Markers  Recent Labs Lab 07/01/16 1747  LATICACIDVEN 1.80    ABG  Recent Labs Lab 07/01/16 2037  PHART 7.347*  PCO2ART 33.6  PO2ART 77.0*    Liver Enzymes  Recent Labs Lab 07/01/16 1722  AST 25  ALT 19  ALKPHOS 49  BILITOT 1.2  ALBUMIN 3.1*    Cardiac Enzymes  Recent Labs Lab 07/01/16 1734  TROPONINI 0.32*    Glucose No results for input(s): GLUCAP in the last 168 hours.  Imaging Dg Chest Port 1 View  Result Date: 07/01/2016 CLINICAL DATA:  Chest pain. History of cardiomyopathy, pleural effusion, pulmonary hypertension, heart failure. EXAM: PORTABLE CHEST 1 VIEW COMPARISON:  Chest radiograph August 13, 2015 FINDINGS: The cardiac silhouette is mildly enlarged and unchanged. Patient rotated LEFT. Mediastinal silhouette is nonsuspicious. No pleural effusion or focal consolidation. Biapical pleural thickening. No pneumothorax. Osteopenia. Moderate degenerative change of the LEFT shoulder. IMPRESSION: Mild cardiomegaly, no acute pulmonary process. Electronically Signed   By: Elon Alas M.D.   On: 07/01/2016 19:02    STUDIES:    CULTURES: Blood 12/11 > Urine 12/11 >  ANTIBIOTICS: Zosyn  12/11 > Vancomycin 12/11 >  SIGNIFICANT EVENTS:   LINES/TUBES:   DISCUSSION:   ASSESSMENT / PLAN:  PULMONARY A: OSA not on CPAP  P:   Supplemental O2 as needed to keep sats > 90% IS as tolerate  CARDIOVASCULAR A:  Shock, septic vs hypovolemic Chronic systolic CHF PAH  P:  Telemetry Dry on exam. Give one liter then 159mL per hour Levophed to keep MAP > 28mmHg Holding home lasix May need CVL if pressor demand increases  RENAL AKI (baseline 0.7)  P:   Hydrate as above Follow BMP  GASTROINTESTINAL A:   No acute issues  P:   Pepcid for SUP NPO  HEMATOLOGIC A:   No acute issues  P:  Subcutaneous heparin Follow CBC  INFECTIOUS A:    Sepsis . Suspect urinary source  P:   UA pending Follow cultures ABX as above  ENDOCRINE A:   DM ? Relative AI P:   Check cortisol SSI  NEUROLOGIC A:   Acute metabolic encepahlopathy P:   RASS goal: 0    FAMILY  - Updates: Family updated by DF  - Inter-disciplinary family meet or Palliative Care meeting due by:  12/18    Georgann Housekeeper, AGACNP-BC [place] [facility] Pager [phone] or [phone]  07/01/2016 9:29 PM  STAFF NOTE: Linwood Dibbles, MD FACP have personally reviewed patient's available data, including medical history, events of note, physical examination and test results as part of my evaluation. I have discussed with resident/NP and other care providers such as pharmacist, RN and RRT. In addition, I personally evaluated patient and elicited key findings of: more awake no then on admission to ER, lungs are clear, abdo soft, pcxr no source , appears as septic shock, lactic reassuring, code sepsis called, agree, receiving 30 cc/kg, had condom cath change , did he also have foley prior?, this appears to be septic shock from urine source, although no UA back, need to place foley given no urine and source likely urine and associated arf, may need renal US, follow renal fxn and output from foley, empiric vanc, zosyn, given prior abx exposure, BC, I perfromed repeat assessment, also appears hypoivolemic , would bolus further, will try to avoid line, on love 3 mics, hope can dc this, if brady further may change to dop, I updated pt and wife in room The patient is critically ill with multiple organ systems failure and requires high complexity decision making for assessment and support, frequent evaluation and titration of therapies, application of advanced monitoring technologies and extensive interpretation of multiple databases.   Critical Care Time devoted to patient care services described in this note is 35 Minutes. This time reflects time of  care of this signee: Merrie Roof, MD FACP. This critical care time does not reflect procedure time, or teaching time or supervisory time of PA/NP/Med student/Med Resident etc but could involve care discussion time. Rest per NP/medical resident whose note is outlined above and that I agree with   Lavon Paganini. Titus Mould, MD, [place] Pgr: [place] [facility] 07/01/2016 9:56 PM

## 2016-07-01 NOTE — ED Provider Notes (Signed)
[facility] DEPT Provider Note   CSN: [id number] Arrival date & time: 07/01/16  1701     History   Chief Complaint Chief Complaint  Patient presents with  . Hypotension    HPI Jaime [name] is a 75 y.o. male.  HPI Pt comes in with cc of confusion. LEVEL 5 CAVEAT FOR ALTERED MENTAL STATUS. PT has hx of CAD/CHF with EF 35%, MS, IDDM, HTN, HL. Pt is not on immunosuppressives. Wife reports that pt has been weak since yday, and today he was hard to arouse and so she called EMS. Pt is arousable here, he has no complains.  Past Medical History:  Diagnosis Date  . Atherosclerotic PVD with ulceration ([place])    left foot  . CAD (coronary artery disease) CARDIOLOGIST- DR KATZ-- VISIT 06-05-2011 IN [facility]   non-STEMI, 2007.Marland Kitchenoccluded circumflex.. Taxus stent placed...residual 80% LAD...50% RCA  . Cardiomyopathy, ischemic    EF 45% per ECHO 2008  //   EF 25%, echo, August, 2013 //  Echo (8/15):  Mild LVH, EF 30-35%, ant-lat and lat AK, inf HK, Gr 1 DD, mild MR, mild LAE  . Carotid artery disease ([place])    a.  Doppler, February, 2012, 0-39% bilateral,Mild smooth plaque;  b.  Carotid US (8/15):  Bilateral 1-39% ICA >>> F/u 2 years  . Chronic indwelling Foley catheter   . Constipation   . Dementia   . Diabetes mellitus    INSULIN-DEPENDent  . Fatigue SEVERE  . H/O pleural effusion 2008   POST THORACENTESIS  . History of colon polyps PRECANCEROUS  . Hyperlipidemia   . Hypertension   . Impotence   . Increased prostate specific antigen (PSA) velocity   . Insomnia   . Lung nodule    resolved 11-2006 CT Chest  . Multiple sclerosis ([place]) [place] -- LAST VISIT 11-20-2010  NOTE W/ CHART  . PAC (premature atrial contraction)    December, 2013  . Pulmonary hypertension    moderate ECHO Jan 2008  . Scoliosis associated with other condition   . Sleep apnea   . Systolic heart failure   . TIA (transient ischemic attack)   . Tobacco abuse    quit   . Urinary  retention    dx ~ 2-12, like from [place], now with a catheter, saw urology  . Urinary tract infection    hx of    Patient Active Problem List   Diagnosis Date Noted  . GERD (gastroesophageal reflux disease) 04/10/2016  . Coronary artery disease involving native coronary artery of native heart without angina pectoris 12/28/2015  . PAD (peripheral artery disease) ([place]) 12/28/2015  . Shortness of breath 12/28/2015  . Bradycardia 08/14/2015  . Chronic combined systolic and diastolic CHF (congestive heart failure) ([place]) 08/14/2015  . Bronchitis 08/13/2015  . Phantom limb pain ([place]) 12/08/2014  . Syncope 11/01/2014  . Coronary arteriosclerosis   . Dyspnea   . SOB (shortness of breath)   . Sepsis ([place]) 08/18/2014  . UTI (lower urinary tract infection) 08/18/2014  . 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 Paraparesis of both lower limbs ([place]) 05/31/2014  . Chronic systolic CHF (congestive heart failure) ([place]) 05/30/2014  . Scoliosis, thoracogenic, acquired 03/15/2014  . Mild cognitive impairment 02/11/2014  . PVD (peripheral vascular disease) ([place]) 08/13/2013  . Pain in limb 08/13/2013  . S/P AKA (above knee amputation) bilateral ([place]) 08/03/2012  . Cough 07/21/2012  . PAC (premature atrial contraction)   . Peripheral  vascular disease, unspecified 07/03/2012  . 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 Atherosclerosis of native arteries of the extremities with ulceration(440.23) 04/17/2012  . Ejection fraction   . Cardiomyopathy, ischemic   . Pulmonary hypertension   . Urinary retention   . Hemiparesis ([place])   . Essential hypertension   . Urinary tract infection after immobility, UCX MRSA 03/26/2012  . Atherosclerotic PVD with ulceration ([place]) 03/06/2012  . CAD (coronary artery disease)   . Carotid artery disease ([place])   . SHOULDER PAIN 08/24/2010  . HYPERLIPIDEMIA 07/31/2007  . BPH (benign prostatic hyperplasia) 04/15/2007  .  DMII (diabetes mellitus, type 2) ([place]) 12/18/2006    Past Surgical History:  Procedure Laterality Date  . ABDOMINAL ANGIOGRAM  12/17/2011   Procedure: ABDOMINAL ANGIOGRAM;  Surgeon: Serafina Mitchell, MD;  Location: [facility] CATH LAB;  Service: Cardiovascular;;  . ABDOMINAL AORTAGRAM N/A 08/19/2013   Procedure: ABDOMINAL Maxcine Ham;  Surgeon: Conrad Layhill, MD;  Location: [facility] CATH LAB;  Service: Cardiovascular;  Laterality: N/A;  . AMPUTATION  03/17/2012   Procedure: AMPUTATION ABOVE KNEE;  Surgeon: Conrad Argyle, MD;  Location: [facility];  Service: Vascular;  Laterality: Left;  . AMPUTATION  06/03/2012   Procedure: AMPUTATION ABOVE KNEE;  Surgeon: Conrad Dallam, MD;  Location: [facility];  Service: Vascular;  Laterality: Right;  . CHOLECYSTECTOMY N/A 10/25/2014   Procedure: LAPAROSCOPIC CHOLECYSTECTOMY ;  Surgeon: Coralie Keens, MD;  Location: [facility];  Service: General;  Laterality: N/A;  . CORONARY ANGIOPLASTY WITH STENT PLACEMENT  05-01-2006   OCCLUDED CIRCUMFLEX -- TAXUS STENT PLACMENT  AND RESIDUAL 80% LAD,  50% RCA  . CYSTOSCOPY  07/30/2011   Procedure: CYSTOSCOPY;  Surgeon: Hanley Ben, MD;  Location: [facility];  Service: Urology;  Laterality: N/A;  . ERCP N/A 08/25/2014   Procedure: ENDOSCOPIC RETROGRADE CHOLANGIOPANCREATOGRAPHY (ERCP);  Surgeon: Ladene Artist, MD;  Location: [facility] ENDOSCOPY;  Service: Endoscopy;  Laterality: N/A;  . FEMORAL-POPLITEAL BYPASS GRAFT  03/11/2012   Procedure: BYPASS GRAFT FEMORAL-POPLITEAL ARTERY;  Surgeon: Conrad Stickney, MD;  Location: [facility];  Service: Vascular;  Laterality: Left;  embolectomy left lower leg  . FEMORAL-TIBIAL BYPASS GRAFT  03/11/2012   Procedure: BYPASS GRAFT FEMORAL-TIBIAL ARTERY;  Surgeon: Conrad Belden, MD;  Location: [facility] OR;  Service: Vascular;  Laterality: Left;  Left Femoral -Tibial trunk bypass, Endarterectomy of Tibial- Peroneal trunk with vein angioplasty.  Marland Kitchen HERNIA REPAIR  1990   (R)  . INTRAOPERATIVE ARTERIOGRAM  03/11/2012   Procedure:  INTRA OPERATIVE ARTERIOGRAM;  Surgeon: Conrad Southchase, MD;  Location: [facility];  Service: Vascular;  Laterality: Left;  . LOWER EXTREMITY ANGIOGRAM Bilateral 12/17/2011   Procedure: LOWER EXTREMITY ANGIOGRAM;  Surgeon: Serafina Mitchell, MD;  Location: [facility] CATH LAB;  Service: Cardiovascular;  Laterality: Bilateral;  bil lower extrem angio  . THORACENTESIS  2008   PLEURAL EFFUSION  . TRANSURETHRAL RESECTION OF PROSTATE  07/30/2011   Procedure: TRANSURETHRAL RESECTION OF THE PROSTATE (TURP);  Surgeon: Hanley Ben, MD;  Location: [facility];  Service: Urology;  Laterality: N/A;       Home Medications    Prior to Admission medications   Medication Sig Start Date End Date Taking? Authorizing Provider  albuterol (PROVENTIL HFA;VENTOLIN HFA) 108 (90 Base) MCG/ACT inhaler Inhale 2 puffs into the lungs every 6 (six) hours as needed for wheezing or shortness of breath. 12/28/15  Yes Dorothy Spark, MD  aspirin EC 325 MG tablet Take 325  mg by mouth daily.   Yes Historical Provider, MD  atorvastatin (LIPITOR) 40 MG tablet Take 1 tablet (40 mg total) by mouth daily. 06/11/16  Yes [name], MD  azelastine (ASTELIN) 0.1 % nasal spray Place 2 sprays into both nostrils at bedtime as needed for rhinitis. Use in each nostril as directed 08/28/15  Yes Colon Branch, MD  baclofen (LIORESAL) 10 MG tablet Take 0.5 tablets (5 mg total) by mouth 2 (two) times daily. 01/24/16  Yes Pieter Partridge, DO  Cholecalciferol (VITAMIN D) 2000 units CAPS Take 1 capsule by mouth daily.    Yes Historical Provider, MD  diclofenac sodium (VOLTAREN) 1 % GEL Apply 1 application topically 2 (two) times daily as needed (pain). For pain 03/09/12  Yes Charlett Blake, MD  furosemide (LASIX) 20 MG tablet Take 1 tablet (20 mg total) by mouth daily. 04/22/16 07/21/16 Yes Dorothy Spark, MD  glucagon (GLUCAGEN) 1 MG SOLR injection Inject 1 mg into the vein once as needed for low blood sugar. Reported on 11/16/2015   Yes Historical  Provider, MD  insulin aspart protamine- aspart (NOVOLOG MIX 70/30) (70-30) 100 UNIT/ML injection Inject 0.05 mLs (5 Units total) into the skin 2 (two) times daily with a meal. 11/03/14  Yes Belkys A Regalado, MD  lisinopril (PRINIVIL,ZESTRIL) 10 MG tablet TAKE 1 TABLET BY MOUTH EVERY DAY 04/26/16  Yes [name], MD  Multiple Vitamins-Minerals (MULTIVITAMIN WITH MINERALS) tablet Take 1 tablet by mouth daily.   Yes Historical Provider, MD  pantoprazole (PROTONIX) 40 MG tablet Take 1 tablet (40 mg total) by mouth daily with lunch. 06/11/16  Yes [name], MD  pregabalin (LYRICA) 150 MG capsule Take 1 capsule (150 mg total) by mouth 2 (two) times daily. 02/15/16  Yes Adam Telford Nab, DO  traMADol (ULTRAM) 50 MG tablet Take 1 tablet (50 mg total) by mouth every 6 (six) hours as needed. 06/06/16  Yes Pieter Partridge, DO  mupirocin ointment (BACTROBAN) 2 % Apply topically 2 (two) times daily as needed. Patient not taking: Reported on 07/01/2016 06/01/15   Colon Branch, MD    Family History Family History  Problem Relation Age of Onset  . Heart attack Mother 25  . Heart disease Mother   . Stroke Mother   . Hyperlipidemia Mother   . Hypertension Mother   . COPD Father   . Peripheral vascular disease Father   . Diabetes Brother   . Heart disease Brother   . Hypertension Brother   . Heart attack Brother   . Diabetes Daughter   . Colon cancer Neg Hx   . Prostate cancer Neg Hx     Social History Social History  Substance Use Topics  . Smoking status: Former Smoker    Years: 50.00    Quit date: 07/22/2012  . Smokeless tobacco: Current User     Comment: pt states that he is using E-cigs  . Alcohol use No     Allergies   Bee venom and Influenza vaccines   Review of Systems Review of Systems  Unable to perform ROS: Dementia     Physical Exam Updated Vital Signs BP (!) 85/68   Pulse 70   Temp 99.2 F (37.3 C) (Rectal)   Resp 17   Wt 145 lb (65.8 kg)   SpO2 100%   BMI 28.32 kg/m    Physical Exam  Constitutional: He appears well-developed.  HENT:  Head: Atraumatic.  Eyes: Pupils are equal, round,  and reactive to light.  3 and equal  Neck: Neck supple. No JVD present.  Cardiovascular: Normal rate.   Pulmonary/Chest: Effort normal and breath sounds normal. No respiratory distress.  Abdominal: Soft. He exhibits no distension. There is no tenderness.  Neurological:  Somnolent.   Skin: Skin is warm.  No open pressure ulcer in the back  Nursing note and vitals reviewed.    ED Treatments / Results  Labs (all labs ordered are listed, but only abnormal results are displayed) Labs Reviewed  COMPREHENSIVE METABOLIC PANEL - Abnormal; Notable for the following:       Result Value   CO2 21 (*)    BUN 29 (*)    Albumin 3.1 (*)    GFR calc non Af Amer 56 (*)    All other components within normal limits  CBC WITH DIFFERENTIAL/PLATELET - Abnormal; Notable for the following:    WBC 22.4 (*)    RDW 17.3 (*)    Platelets 139 (*)    Neutro Abs 19.1 (*)    Monocytes Absolute 1.3 (*)    All other components within normal limits  PROTIME-INR - Abnormal; Notable for the following:    Prothrombin Time 16.0 (*)    All other components within normal limits  TROPONIN I - Abnormal; Notable for the following:    Troponin I 0.32 (*)    All other components within normal limits  BRAIN NATRIURETIC PEPTIDE - Abnormal; Notable for the following:    B Natriuretic Peptide 579.1 (*)    All other components within normal limits  CULTURE, BLOOD (ROUTINE X 2)  CULTURE, BLOOD (ROUTINE X 2)  URINE CULTURE  URINALYSIS, ROUTINE W REFLEX MICROSCOPIC  URINALYSIS, ROUTINE W REFLEX MICROSCOPIC  BLOOD GAS, ARTERIAL  CORTISOL  I-STAT BETA HCG BLOOD, ED (MC, WL, AP ONLY)  I-STAT CG4 LACTIC ACID, ED  I-STAT VENOUS BLOOD GAS, ED  I-STAT CG4 LACTIC ACID, ED    EKG  EKG Interpretation  Date/Time:  Monday July 01 2016 17:06:15 EST Ventricular Rate:  79 PR Interval:    QRS  Duration: 124 QT Interval:  500 QTC Calculation: 574 R Axis:   -50 Text Interpretation:  Sinus or ectopic atrial rhythm Prolonged PR interval Consider left atrial enlargement Left bundle branch block Nonspecific ST and T wave abnormality ST elevation v1-v3  - new ST depression in lead I, II and lateral leads - new new changes noted Confirmed by Kathrynn Humble, MD, Thelma Comp [phone]) on 07/01/2016 5:22:55 PM       Radiology Dg Chest Port 1 View  Result Date: 07/01/2016 CLINICAL DATA:  Chest pain. History of cardiomyopathy, pleural effusion, pulmonary hypertension, heart failure. EXAM: PORTABLE CHEST 1 VIEW COMPARISON:  Chest radiograph August 13, 2015 FINDINGS: The cardiac silhouette is mildly enlarged and unchanged. Patient rotated LEFT. Mediastinal silhouette is nonsuspicious. No pleural effusion or focal consolidation. Biapical pleural thickening. No pneumothorax. Osteopenia. Moderate degenerative change of the LEFT shoulder. IMPRESSION: Mild cardiomegaly, no acute pulmonary process. Electronically Signed   By: Elon Alas M.D.   On: 07/01/2016 19:02    Procedures Procedures (including critical care time)  Medications Ordered in ED Medications  sodium chloride 0.9 % bolus 1,000 mL (1,000 mLs Intravenous New Bag/Given 07/01/16 2014)  norepinephrine (LEVOPHED) 4 mg in dextrose 5 % 250 mL (0.016 mg/mL) infusion (not administered)  0.9 %  sodium chloride infusion (1,000 mLs Intravenous Not Given 07/01/16 1900)  vancomycin (VANCOCIN) IVPB 1000 mg/200 mL premix (1,000 mg Intravenous New Bag/Given 07/01/16 1949)  piperacillin-tazobactam (ZOSYN) IVPB 3.375 g (not administered)  vancomycin (VANCOCIN) 500 mg in sodium chloride 0.9 % 100 mL IVPB (not administered)  0.9 %  sodium chloride infusion ( Intravenous Stopped 07/01/16 1840)  norepinephrine (LEVOPHED) 4 mg in dextrose 5 % 250 mL (0.016 mg/mL) infusion (3 mcg/min Intravenous Rate/Dose Change 07/01/16 2020)  piperacillin-tazobactam (ZOSYN) IVPB  3.375 g (0 g Intravenous Stopped 07/01/16 1925)  sodium chloride 0.9 % bolus 1,000 mL (0 mLs Intravenous Stopped 07/01/16 2015)     Initial Impression / Assessment and Plan / ED Course  I have reviewed the triage vital signs and the nursing notes.  Pertinent labs & imaging results that were available during my care of the patient were reviewed by me and considered in my medical decision making (see chart for details).  Clinical Course as of Jul 01 2037  Mon Jul 01, 2016  2031 Bedside US shows that pt is likely dry based on IVC. He is also noted to have poor EF and no pericardial effusion. CCM admitting. Pt's BP has improved. He is on his 2nd liter of NS and is on pressor that we started early. Pt is now awake and alert. He was obtunded earlier. DDX: Septic shock with urine as the source. We question is neurogenic shock is possible since pt has low BP with low HR. Pt is not frankly bradycardic.   [AN]  2036 Sepsis reassessment completed  [AN]    Clinical Course User Index [AN] Varney Biles, MD     Final Clinical Impressions(s) / ED Diagnoses   Final diagnoses:  Shock ([place])  Altered mental status, unspecified altered mental status type   Pt comes in with low BP. He is altered / sleepy. Pt has no fevers and he has normal HR. PT has CHF - he is not cold to touch.  Pt is in shock - it is undifferentiated. No clear source of infection. He has a catheter in place (condom cath). Urine could be source of infection. PT could be in hypovolemic shock as he has not had po intake since yday, and cardiogenic shock (EF35%), neurogenic shock (hypotension / HR in the 60s) also considered. Pt is not on steroids. Adrenal insufficiency will be considered higher in the ddx if pt doesn't respond despite fluids.  Spoke with pt's family about goals of care. They would prefer full code, but would prefer that pt not go on life support / vent. We discussed fluid management -aggressive vs. Conservative,  and for now they would prefer fluids as tolerated. We will start norepi immediately. 2 boluses ordered for fluids, but to be given at 500 cc/liters. We will try echo at bedside. Code sepsis activated.    EMERGENCY DEPARTMENT [place] CARDIAC EXAM "Study: Limited Ultrasound of the heart and pericardium"  INDICATIONS:Hypotension Multiple views of the heart and pericardium were obtained in real-time with a multi-frequency probe.  PERFORMED [id number]  IMAGES ARCHIVED?: Yes  FINDINGS: No pericardial effusion and Decreased contractility  LIMITATIONS:  Body habitus  VIEWS USED: Parasternal long axis, Parasternal short axis and Apical 4 chamber   INTERPRETATION: Cardiac activity present, Pericardial effusioin absent, Cardiac tamponade absent and Decreased contractility  CPT Code: [id number] (limited transthoracic cardiac)     New Prescriptions New Prescriptions   No medications on file     Varney Biles, MD 07/01/16 2042

## 2016-07-01 NOTE — Progress Notes (Signed)
ANTIBIOTIC CONSULT NOTE - INITIAL  Pharmacy Consult for Vanco/Zosyn Indication: sepsis  Allergies  Allergen Reactions  . Bee Venom Anaphylaxis  . Influenza Vaccines Other (See Comments)    Sick for months    Patient Measurements: Weight: 145 lb (65.8 kg) Adjusted Body Weight:    Vital Signs: Temp: 99.2 F (37.3 C) (12/11 1743) Temp Source: Rectal (12/11 1743) BP: 99/53 (12/11 1922) Pulse Rate: 73 (12/11 1922) Intake/Output from previous day: No intake/output data recorded. Intake/Output from this shift: No intake/output data recorded.  Labs:  Recent Labs  07/01/16 1722  WBC 22.4*  HGB 13.7  PLT 139*  CREATININE 1.23   CrCl cannot be calculated (Unknown ideal weight.). No results for input(s): VANCOTROUGH, VANCOPEAK, VANCORANDOM, GENTTROUGH, GENTPEAK, GENTRANDOM, TOBRATROUGH, TOBRAPEAK, TOBRARND, AMIKACINPEAK, AMIKACINTROU, AMIKACIN in the last 72 hours.   Microbiology: Recent Results (from the past 720 hour(s))  Urine Culture     Status: None   Collection Time: 06/12/16 12:04 PM  Result Value Ref Range Status   Organism ID, Bacteria NO GROWTH  Final    Medical History: Past Medical History:  Diagnosis Date  . Atherosclerotic PVD with ulceration ([place])    left foot  . CAD (coronary artery disease) CARDIOLOGIST- DR KATZ-- VISIT 06-05-2011 IN [facility]   non-STEMI, 2007.Marland Kitchenoccluded circumflex.. Taxus stent placed...residual 80% LAD...50% RCA  . Cardiomyopathy, ischemic    EF 45% per ECHO 2008  //   EF 25%, echo, August, 2013 //  Echo (8/15):  Mild LVH, EF 30-35%, ant-lat and lat AK, inf HK, Gr 1 DD, mild MR, mild LAE  . Carotid artery disease ([place])    a.  Doppler, February, 2012, 0-39% bilateral,Mild smooth plaque;  b.  Carotid US (8/15):  Bilateral 1-39% ICA >>> F/u 2 years  . Chronic indwelling Foley catheter   . Constipation   . Dementia   . Diabetes mellitus    INSULIN-DEPENDent  . Fatigue SEVERE  . H/O pleural effusion 2008   POST THORACENTESIS  . History  of colon polyps PRECANCEROUS  . Hyperlipidemia   . Hypertension   . Impotence   . Increased prostate specific antigen (PSA) velocity   . Insomnia   . Lung nodule    resolved 11-2006 CT Chest  . Multiple sclerosis ([place]) [place] -- LAST VISIT 11-20-2010  NOTE W/ CHART  . PAC (premature atrial contraction)    December, 2013  . Pulmonary hypertension    moderate ECHO Jan 2008  . Scoliosis associated with other condition   . Sleep apnea   . Systolic heart failure   . TIA (transient ischemic attack)   . Tobacco abuse    quit   . Urinary retention    dx ~ 2-12, like from [place], now with a catheter, saw urology  . Urinary tract infection    hx of    Medications:  F/u med rec  Assessment: 75 y/o M with SOB, CP with cough, hypotension. Code sepsis  ID: Code sepsis. WBC 22.4. Antimicrobials this admission:  Vanco 12/11>> Zosyn 12/11>>  Goal of Therapy:  Vancomycin trough level 15-20 mcg/ml  Plan:  Zosyn 3.375g IV q8hr Vancomycin 1g IV x 1 then 500mg  IV q12h Vancomycin trough after 3-5 doses at steady state.  Ellayna Hilligoss S. Alford [place], PharmD, BCPS Clinical Staff Pharmacist Pager [phone]  Eilene Ghazi Stillinger 07/01/2016,7:39 PM

## 2016-07-01 NOTE — Progress Notes (Signed)
ABG drawn on Room Air

## 2016-07-01 NOTE — ED Notes (Signed)
Patient placed on monitor, continuous pulse oximetry and blood pressure cuff 

## 2016-07-01 NOTE — ED Triage Notes (Signed)
[facility] called out for chest pain that started 3 days ago.  Patient complains of shortness of breath, increased chest pain with cough.  Patient hypotensive on EMS arrival at 80/54, BP 85/59 in ED.  Patient is alert and oriented to self, situation, location, disoriented to time.  20g IV saline lock in left hand.

## 2016-07-02 ENCOUNTER — Telehealth: Payer: Self-pay | Admitting: *Deleted

## 2016-07-02 LAB — BLOOD CULTURE ID PANEL (REFLEXED)
ACINETOBACTER BAUMANNII: NOT DETECTED
CANDIDA KRUSEI: NOT DETECTED
Candida albicans: NOT DETECTED
Candida glabrata: NOT DETECTED
Candida parapsilosis: NOT DETECTED
Candida tropicalis: NOT DETECTED
ENTEROCOCCUS SPECIES: NOT DETECTED
ESCHERICHIA COLI: NOT DETECTED
Enterobacter cloacae complex: NOT DETECTED
Enterobacteriaceae species: NOT DETECTED
Haemophilus influenzae: NOT DETECTED
Klebsiella oxytoca: NOT DETECTED
Klebsiella pneumoniae: NOT DETECTED
LISTERIA MONOCYTOGENES: NOT DETECTED
METHICILLIN RESISTANCE: NOT DETECTED
NEISSERIA MENINGITIDIS: NOT DETECTED
PROTEUS SPECIES: NOT DETECTED
PSEUDOMONAS AERUGINOSA: NOT DETECTED
SERRATIA MARCESCENS: NOT DETECTED
STAPHYLOCOCCUS AUREUS BCID: NOT DETECTED
STAPHYLOCOCCUS SPECIES: DETECTED — AB
STREPTOCOCCUS AGALACTIAE: NOT DETECTED
STREPTOCOCCUS PNEUMONIAE: NOT DETECTED
STREPTOCOCCUS SPECIES: NOT DETECTED
Streptococcus pyogenes: NOT DETECTED

## 2016-07-02 LAB — GLUCOSE, CAPILLARY
GLUCOSE-CAPILLARY: 137 mg/dL — AB (ref 65–99)
Glucose-Capillary: 122 mg/dL — ABNORMAL HIGH (ref 65–99)
Glucose-Capillary: 154 mg/dL — ABNORMAL HIGH (ref 65–99)
Glucose-Capillary: 192 mg/dL — ABNORMAL HIGH (ref 65–99)
Glucose-Capillary: 62 mg/dL — ABNORMAL LOW (ref 65–99)
Glucose-Capillary: 75 mg/dL (ref 65–99)
Glucose-Capillary: 75 mg/dL (ref 65–99)

## 2016-07-02 LAB — CBC
HCT: 37 % — ABNORMAL LOW (ref 39.0–52.0)
Hemoglobin: 12.3 g/dL — ABNORMAL LOW (ref 13.0–17.0)
MCH: 26.6 pg (ref 26.0–34.0)
MCHC: 33.2 g/dL (ref 30.0–36.0)
MCV: 80.1 fL (ref 78.0–100.0)
PLATELETS: 132 10*3/uL — AB (ref 150–400)
RBC: 4.62 MIL/uL (ref 4.22–5.81)
RDW: 17.5 % — ABNORMAL HIGH (ref 11.5–15.5)
WBC: 19.7 10*3/uL — ABNORMAL HIGH (ref 4.0–10.5)

## 2016-07-02 LAB — BASIC METABOLIC PANEL
Anion gap: 8 (ref 5–15)
BUN: 24 mg/dL — AB (ref 6–20)
CO2: 19 mmol/L — ABNORMAL LOW (ref 22–32)
CREATININE: 0.95 mg/dL (ref 0.61–1.24)
Calcium: 7.9 mg/dL — ABNORMAL LOW (ref 8.9–10.3)
Chloride: 113 mmol/L — ABNORMAL HIGH (ref 101–111)
Glucose, Bld: 114 mg/dL — ABNORMAL HIGH (ref 65–99)
Potassium: 3.8 mmol/L (ref 3.5–5.1)
SODIUM: 140 mmol/L (ref 135–145)

## 2016-07-02 LAB — CORTISOL: Cortisol, Plasma: 10.1 ug/dL

## 2016-07-02 LAB — MRSA PCR SCREENING: MRSA BY PCR: NEGATIVE

## 2016-07-02 MED ORDER — INSULIN ASPART 100 UNIT/ML ~~LOC~~ SOLN
0.0000 [IU] | Freq: Three times a day (TID) | SUBCUTANEOUS | Status: DC
Start: 2016-07-02 — End: 2016-07-06
  Administered 2016-07-02: 2 [IU] via SUBCUTANEOUS
  Administered 2016-07-02: 1 [IU] via SUBCUTANEOUS
  Administered 2016-07-03 (×2): 2 [IU] via SUBCUTANEOUS
  Administered 2016-07-04 – 2016-07-05 (×3): 1 [IU] via SUBCUTANEOUS
  Administered 2016-07-05: 2 [IU] via SUBCUTANEOUS
  Administered 2016-07-06: 1 [IU] via SUBCUTANEOUS

## 2016-07-02 MED ORDER — ONETOUCH DELICA LANCETS 33G MISC: 3 refills | Status: DC

## 2016-07-02 MED ORDER — DEXTROSE 5 % IV SOLN
1.0000 g | INTRAVENOUS | Status: DC
  Administered 2016-07-02 – 2016-07-03 (×2): 1 g via INTRAVENOUS
  Filled 2016-07-02 (×3): qty 10

## 2016-07-02 NOTE — Progress Notes (Signed)
  PHARMACY - PHYSICIAN COMMUNICATION CRITICAL VALUE ALERT - BLOOD CULTURE IDENTIFICATION (BCID)  Results for orders placed or performed during the hospital encounter of 07/01/16  Blood Culture ID Panel (Reflexed) (Collected: 07/01/2016  6:16 PM)  Result Value Ref Range   Enterococcus species NOT DETECTED NOT DETECTED   Listeria monocytogenes NOT DETECTED NOT DETECTED   Staphylococcus species DETECTED (A) NOT DETECTED   Staphylococcus aureus NOT DETECTED NOT DETECTED   Methicillin resistance NOT DETECTED NOT DETECTED   Streptococcus species NOT DETECTED NOT DETECTED   Streptococcus agalactiae NOT DETECTED NOT DETECTED   Streptococcus pneumoniae NOT DETECTED NOT DETECTED   Streptococcus pyogenes NOT DETECTED NOT DETECTED   Acinetobacter baumannii NOT DETECTED NOT DETECTED   Enterobacteriaceae species NOT DETECTED NOT DETECTED   Enterobacter cloacae complex NOT DETECTED NOT DETECTED   Escherichia coli NOT DETECTED NOT DETECTED   Klebsiella oxytoca NOT DETECTED NOT DETECTED   Klebsiella pneumoniae NOT DETECTED NOT DETECTED   Proteus species NOT DETECTED NOT DETECTED   Serratia marcescens NOT DETECTED NOT DETECTED   Haemophilus influenzae NOT DETECTED NOT DETECTED   Neisseria meningitidis NOT DETECTED NOT DETECTED   Pseudomonas aeruginosa NOT DETECTED NOT DETECTED   Candida albicans NOT DETECTED NOT DETECTED   Candida glabrata NOT DETECTED NOT DETECTED   Candida krusei NOT DETECTED NOT DETECTED   Candida parapsilosis NOT DETECTED NOT DETECTED   Candida tropicalis NOT DETECTED NOT DETECTED    Name of physician (or Provider) Contacted: Dr. Beatrix Shipper  Changes to prescribed antibiotics required: Pt currently on ceftriaxone for possible UTI.  Tm 99, WBC improving 22>19.  CNS 1/2 BCx possible contaminant.  Would not recommend ABX change at this time  Bonnita Nasuti Pharm.D. CPP, BCPS Clinical Pharmacist [phone] 07/02/2016 4:36 PM

## 2016-07-02 NOTE — Care Management Note (Signed)
Case Management Note  Patient Details  Name: Jaime [name] MRN: [id number] Date of Birth: July 19, 1941  Subjective/Objective:      Pt admitted with hypotention               Action/Plan:  PTA from home with wife and adult daughter.  Pt has hx of  bilateral knee amputee.  Both wife and daughter assist pt, have wheelchair and hoyer lift in the home and denied needing any additional equipment nor HH.  Wife has aid that comes in a helps a few days a week - private pay.  CM will continue to follow for discharge needs   Expected Discharge Date:                  Expected Discharge Plan:  [place]  In-House Referral:     Discharge planning Services  CM Consult  Post Acute Care Choice:    Choice offered to:     DME Arranged:    DME Agency:     HH Arranged:    HH Agency:     Status of Service:  In process, will continue to follow  If discussed at Long Length of Stay Meetings, dates discussed:    Additional Comments:  Maryclare Labrador, RN 07/02/2016, 10:51 AM

## 2016-07-02 NOTE — Progress Notes (Signed)
Notified MD Byrum in regards to patient's urine turning from yellow to red/pink tinged. Will continue to monitor and assess.

## 2016-07-02 NOTE — Progress Notes (Signed)
Belongings left at bedside. Black bag taken upstairs to patients new room at 5W15.

## 2016-07-02 NOTE — H&P (Signed)
PULMONARY / CRITICAL CARE MEDICINE   Name: Jaime [name] MRN: [id number] DOB: 11-19-1940    ADMISSION DATE:  07/01/2016 CONSULTATION DATE:  07/01/2016  REFERRING MD:  Dr. Kathrynn Humble  CHIEF COMPLAINT:  AMS  BRIEF  75 year old male with past medical history as below, which is significant for multiple sclerosis, CAD, ischemic cardiomyopathy, diabetes mellitus,chronic systolic CHF with ejection fraction 35%, pulmonary hypertension, sleep apnea. He was in his usual state of health until 12/10 when he started to develop some weakness. Of note he does wear a condom catheter at baseline which was changed around that time by home health nurse. By 12/11 he became difficult to arouse so his wife called EMS. In the emergency department initially he was arousable and voiced no complaints. He was afebrile in the emergency department but was found to be hypotensive. He was felt to be dry by the EDP and was given 2 L of normal saline bolus. Despite this, he remained hypotensive and was started on Levophed for blood pressure support. PCCM to admit.    SUBJECTIVE/OVERNIGHT/INTERVAL HX 12/12 - not on vent, off pressors, eating, intermittently confused per his dementia. Still with frank hematuria. Denies complaints. Wants to go home. Wife and daughter at bedside   VITAL SIGNS: BP 135/82   Pulse 89   Temp 99.6 F (37.6 C) (Oral)   Resp 16   Ht 5\' 11"  (1.803 m)   Wt 66 kg (145 lb 8.1 oz)   SpO2 98%   BMI 20.29 kg/m   HEMODYNAMICS:    VENTILATOR SETTINGS:    INTAKE / OUTPUT: I/O last 3 completed shifts: In: 2558.4 [I.V.:1108.4; Other:350; IV Piggyback:1100] Out: 750 [Urine:750]  PHYSICAL EXAMINATION: General: no distress,  awake Neuro:  Nonambulatory, moves upper ext, perr, fc well. Joking and talking HEENT:  jvd none Cardiovascular:  s1 s2 RRR distant, int brady Lungs:  CTA Abdomen:  Soft, obese, NT, nD, nor /no g, no urine Musculoskeletal:  bilater amputation Skin:  Stage 2 sacrums,  intact  LABS: PULMONARY  Recent Labs Lab 07/01/16 2037  PHART 7.347*  PCO2ART 33.6  PO2ART 77.0*  HCO3 18.4*  TCO2 19  O2SAT 95.0    CBC  Recent Labs Lab 07/01/16 1722 07/02/16 0229  HGB 13.7 12.3*  HCT 40.4 37.0*  WBC 22.4* 19.7*  PLT 139* 132*    COAGULATION  Recent Labs Lab 07/01/16 1722  INR 1.27    CARDIAC   Recent Labs Lab 07/01/16 1734  TROPONINI 0.32*   No results for input(s): PROBNP in the last 168 hours.   CHEMISTRY  Recent Labs Lab 07/01/16 1722 07/02/16 0229  NA 139 140  K 3.7 3.8  CL 108 113*  CO2 21* 19*  GLUCOSE 98 114*  BUN 29* 24*  CREATININE 1.23 0.95  CALCIUM 8.9 7.9*   Estimated Creatinine Clearance: 63.7 mL/min (by C-G formula based on SCr of 0.95 mg/dL).   LIVER  Recent Labs Lab 07/01/16 1722  AST 25  ALT 19  ALKPHOS 49  BILITOT 1.2  PROT 6.6  ALBUMIN 3.1*  INR 1.27     INFECTIOUS  Recent Labs Lab 07/01/16 1747  LATICACIDVEN 1.80     ENDOCRINE CBG (last 3)   Recent Labs  07/02/16 0103 07/02/16 0343 07/02/16 0741  GLUCAP 75 122* 75         IMAGING x48h  - image(s) personally visualized  -   highlighted in bold Dg Chest Port 1 View  Result Date: 07/01/2016 CLINICAL DATA:  Chest pain.  History of cardiomyopathy, pleural effusion, pulmonary hypertension, heart failure. EXAM: PORTABLE CHEST 1 VIEW COMPARISON:  Chest radiograph August 13, 2015 FINDINGS: The cardiac silhouette is mildly enlarged and unchanged. Patient rotated LEFT. Mediastinal silhouette is nonsuspicious. No pleural effusion or focal consolidation. Biapical pleural thickening. No pneumothorax. Osteopenia. Moderate degenerative change of the LEFT shoulder. IMPRESSION: Mild cardiomegaly, no acute pulmonary process. Electronically Signed   By: Elon Alas M.D.   On: 07/01/2016 19:02        ASSESSMENT / PLAN:  PULMONARY A: OSA not on CPAP  P:   Supplemental O2 as needed to keep sats > 90% IS as  tolerate  CARDIOVASCULAR A:  Shock, septic vs hypovolemic Chronic systolic CHF PAH  - off pressors Sepsis - Repeat Assessment  Performed at:    11:47 AM   Vitals     Blood pressure 135/82, pulse 89, temperature 97.8 F (36.6 C), temperature source Oral, resp. rate 16, height 5\' 11"  (1.803 m), weight 66 kg (145 lb 8.1 oz), SpO2 98 %.  Heart:     Regular rate and rhythm  Lungs:    CTA  Capillary Refill:   <2 sec  Peripheral Pulse:   Radial pulse palpable  Skin:     Normal Color     P:  Dc levophed Lasix 07/03/16 and beyond per [facility]  RENAL AKI (baseline 0.7)  P:   Hydrate but reduce to kvo Follow BMP  GASTROINTESTINAL A:   No acute issues  P:   Pepcid for SUP hearthealthy diet  HEMATOLOGIC A:   No acute issues but has hematuria  P:  Subcutaneous heparin Follow CBC  INFECTIOUS A:   Sepsis . Suspect urinary source  P:   UA pending Follow cultures ABX  Anti-infectives    Start     Dose/Rate Route Frequency Ordered Stop   07/02/16 1800  cefTRIAXone (ROCEPHIN) 1 g in dextrose 5 % 50 mL IVPB     1 g 100 mL/hr over 30 Minutes Intravenous Every 24 hours 07/02/16 1144     07/02/16 0900  vancomycin (VANCOCIN) 500 mg in sodium chloride 0.9 % 100 mL IVPB  Status:  Discontinued     500 mg 100 mL/hr over 60 Minutes Intravenous Every 12 hours 07/01/16 1939 07/02/16 1144   07/02/16 0300  piperacillin-tazobactam (ZOSYN) IVPB 3.375 g  Status:  Discontinued     3.375 g 12.5 mL/hr over 240 Minutes Intravenous Every 8 hours 07/01/16 1939 07/02/16 1144   07/01/16 1945  vancomycin (VANCOCIN) IVPB 1000 mg/200 mL premix     1,000 mg 200 mL/hr over 60 Minutes Intravenous  Once 07/01/16 1939 07/01/16 2138   07/01/16 1845  piperacillin-tazobactam (ZOSYN) IVPB 3.375 g     3.375 g 100 mL/hr over 30 Minutes Intravenous STAT 07/01/16 1839 07/01/16 1925       ENDOCRINE A:   DM ? Relative AI P:   Check cortisol SSI  NEUROLOGIC A:   Acute metabolic  encepahlopathy   - improved P:   RASS goal: 0    FAMILY  - Updates: Family updated by MR 07/02/2016   - Inter-disciplinary family meet or Palliative Care meeting due by:  12/18  DISPO Move to med surg and ccm off with trh primary from 07/03/16 - d/w Dr Sherral Hammers    Dr. Brand Males, M.D., [facility].C.P Pulmonary and Critical Care Medicine Staff Physician [place] [facility] Pager: [phone], If no answer or between  15:00h - 7:00h: call  [phone]  [phone]  07/02/2016 11:37 AM

## 2016-07-02 NOTE — Progress Notes (Signed)
[place] Progress Note Patient Name: Jaime [name] DOB: [birth year] MRN: [id number]   Date of Service  07/02/2016  HPI/Events of Note  hypoglycemia  eICU Interventions  Heart healthy diet ordered     Intervention Category Intermediate Interventions: Other:  Adolf Ormiston S. 07/02/2016, 12:32 AM

## 2016-07-02 NOTE — Progress Notes (Signed)
Inpatient Diabetes Program Recommendations  AACE/ADA: New Consensus Statement on Inpatient Glycemic Control (2015)  Target Ranges:  Prepandial:   less than 140 mg/dL      Peak postprandial:   less than 180 mg/dL (1-2 hours)      Critically ill patients:  140 - 180 mg/dL   Lab Results  Component Value Date   GLUCAP 122 (H) 07/02/2016   HGBA1C 6.6 (H) 04/10/2016    Review of Glycemic Control:  Results for KERVIN, SWINGLER (MRN [id number]) as of 07/02/2016 07:41  Ref. Range 07/01/2016 22:28 07/02/2016 00:10 07/02/2016 01:03 07/02/2016 03:43  Glucose-Capillary Latest Ref Range: 65 - 99 mg/dL 75 62 (L) 75 122 (H)    Diabetes history: Type 2 diabetes Outpatient Diabetes medications: Novolog 70/30 5 units bid Current orders for Inpatient glycemic control:  ICU glycemic control order set-Novolog 2-4-6 q 4 hours  Inpatient Diabetes Program Recommendations:   Note patient now has diet order, Please consider d/c of ICU glycemic control order set and start Novolog sensitive tid with meals.  Thanks, Adah Perl, RN, BC-ADM Inpatient Diabetes Coordinator Pager [phone] (8a-5p)

## 2016-07-02 NOTE — Progress Notes (Signed)
Report called to receiving RN. Patients vitals stable. Patient removed from monitor and transported to 5W in be by NT.

## 2016-07-02 NOTE — Telephone Encounter (Signed)
Wife left msg on triage requesting lancets for One touch delica to be sent to pharmacy..Sent rx to [facility].../lmb./lmb

## 2016-07-03 ENCOUNTER — Telehealth: Payer: Self-pay | Admitting: Neurology

## 2016-07-03 DIAGNOSIS — B961 Klebsiella pneumoniae [K. pneumoniae] as the cause of diseases classified elsewhere: Secondary | ICD-10-CM

## 2016-07-03 DIAGNOSIS — N390 Urinary tract infection, site not specified: Secondary | ICD-10-CM

## 2016-07-03 DIAGNOSIS — I10 Essential (primary) hypertension: Secondary | ICD-10-CM

## 2016-07-03 LAB — CBC WITH DIFFERENTIAL/PLATELET
Basophils Absolute: 0 10*3/uL (ref 0.0–0.1)
Basophils Relative: 0 %
EOS ABS: 0.2 10*3/uL (ref 0.0–0.7)
EOS PCT: 2 %
HEMATOCRIT: 34.3 % — AB (ref 39.0–52.0)
Hemoglobin: 11.4 g/dL — ABNORMAL LOW (ref 13.0–17.0)
LYMPHS ABS: 1.8 10*3/uL (ref 0.7–4.0)
Lymphocytes Relative: 15 %
MCH: 26.5 pg (ref 26.0–34.0)
MCHC: 33.2 g/dL (ref 30.0–36.0)
MCV: 79.6 fL (ref 78.0–100.0)
MONO ABS: 0.6 10*3/uL (ref 0.1–1.0)
Monocytes Relative: 5 %
Neutro Abs: 9.2 10*3/uL — ABNORMAL HIGH (ref 1.7–7.7)
Neutrophils Relative %: 78 %
PLATELETS: 120 10*3/uL — AB (ref 150–400)
RBC: 4.31 MIL/uL (ref 4.22–5.81)
RDW: 17.7 % — AB (ref 11.5–15.5)
WBC: 11.8 10*3/uL — AB (ref 4.0–10.5)

## 2016-07-03 LAB — BASIC METABOLIC PANEL
Anion gap: 6 (ref 5–15)
BUN: 11 mg/dL (ref 6–20)
CALCIUM: 8.4 mg/dL — AB (ref 8.9–10.3)
CO2: 23 mmol/L (ref 22–32)
CREATININE: 0.8 mg/dL (ref 0.61–1.24)
Chloride: 112 mmol/L — ABNORMAL HIGH (ref 101–111)
GFR calc non Af Amer: >60 mL/min (ref >60)
GLUCOSE: 107 mg/dL — AB (ref 65–99)
Potassium: 4.2 mmol/L (ref 3.5–5.1)
Sodium: 141 mmol/L (ref 135–145)

## 2016-07-03 LAB — GLUCOSE, CAPILLARY
GLUCOSE-CAPILLARY: 195 mg/dL — AB (ref 65–99)
Glucose-Capillary: 94 mg/dL (ref 65–99)
Glucose-Capillary: 162 mg/dL — ABNORMAL HIGH (ref 65–99)
Glucose-Capillary: 194 mg/dL — ABNORMAL HIGH (ref 65–99)

## 2016-07-03 LAB — MAGNESIUM: MAGNESIUM: 1.7 mg/dL (ref 1.7–2.4)

## 2016-07-03 LAB — PHOSPHORUS: Phosphorus: 1.6 mg/dL — ABNORMAL LOW (ref 2.5–4.6)

## 2016-07-03 MED ORDER — ASPIRIN EC 81 MG PO TBEC
81.0000 mg | DELAYED_RELEASE_TABLET | Freq: Every day | ORAL | Status: DC
  Administered 2016-07-03 – 2016-07-06 (×4): 81 mg via ORAL
  Filled 2016-07-03 (×4): qty 1

## 2016-07-03 MED ORDER — VANCOMYCIN HCL IN DEXTROSE 1-5 GM/200ML-% IV SOLN
1000.0000 mg | Freq: Two times a day (BID) | INTRAVENOUS | Status: DC
  Administered 2016-07-03 – 2016-07-06 (×6): 1000 mg via INTRAVENOUS
  Filled 2016-07-03 (×8): qty 200

## 2016-07-03 MED ORDER — TRAMADOL HCL 50 MG PO TABS: 50.0000 mg | ORAL_TABLET | Freq: Two times a day (BID) | ORAL | Status: DC | PRN

## 2016-07-03 NOTE — Progress Notes (Signed)
Patient's BP 167/86.  On-call MD, Jamal Collin notified and said to continue monitoring for now.  Will continue to monitor and notify MD as needed.

## 2016-07-03 NOTE — Progress Notes (Signed)
PROGRESS NOTE [facility]   Jaime [name]   [id number] DOB: Jul 26, 1940  DOA: 07/01/2016 PCP: [name], MD   Brief Narrative:  75 year old male with past medical history of CAD, hypertension, CHF with ejection fraction of 35% and pulmonary hypertension PVD with ulceration, diabetes mellitus, dementia, and urinary retention, condom catheter use that was recently changes. Bilateral BKA. Wife brought him today because he was having trouble waking up patient. In the ED patient was found to be hypotensive and was started on levophed for blood pressure support. She was alerted by critical care team felt to be stable to be in medical surgical floor to continue treatment for septic shock secondary to UTI and bacteremia.  Subjective: Patient seen and examined. Stated that his feeling much better than yesterday has no complaints this morning. Denies chest pain, shortness of breath, abdominal pain and diarrhea.  Assessment & Plan: Septic shock secondary to UTI  Off pressors Urine culture showed Klebsiella Blood culture Staphylococcus species - likely to be contaminant given these only one bottle. Continue empiric antibiotics - adjust pending sensitivities Will add Vancomycin and repeat blood cultures D/c foley cath   HTN  BP meds on hold - will resume as BP normalize Monitor BP   CAD - no chest pain  Cont ASA  Chronic diastolic CHF - EF 35 %  No signs of fluid overload at this time  Holding lasix  May resume in AM if BP allows   DM type II  CBGS stable  Consider adding home basal insulin in AM  Continue SSI  Monitor CBGs  DVT prophylaxis: Lovenox  Code Status: Full Family Communication: None at bedside  Disposition Plan: Anticipate discharge to previous environment when medically stable   Consultants:   PCCM   Procedures:   None   Antimicrobials:  Ceftriaxone 12/11->   Vanco 12/13 ->   Objective: Vitals:   07/02/16 1537 07/02/16 2138 07/03/16  0528 07/03/16 1429  BP: 106/63 124/63 (!) 167/86 122/67  Pulse: 72 75 71 75  Resp: 16 18 18 18   Temp: 99 F (37.2 C) 98.7 F (37.1 C) 98.9 F (37.2 C)   TempSrc: Oral Oral Oral   SpO2: 100% 100% 98% 99%  Weight:   68.1 kg (150 lb 1.6 oz)   Height:        Intake/Output Summary (Last 24 hours) at 07/03/16 1653 Last data filed at 07/03/16 1500  Gross per 24 hour  Intake           431.83 ml  Output              500 ml  Net           -68.17 ml   Filed Weights   07/02/16 0500 07/02/16 1534 07/03/16 0528  Weight: 66 kg (145 lb 8.1 oz) 67 kg (147 lb 12.8 oz) 68.1 kg (150 lb 1.6 oz)    Examination:  General exam: Appears calm and comfortable  HEENT: AC/AT, PERRLA, OP moist and clear Respiratory system: Clear to auscultation. No wheezes,crackle or rhonchi Cardiovascular system: S1 & S2 heard, RRR. No JVD, murmurs, rubs or gallops Gastrointestinal system: Abdomen is nondistended, soft and nontender. Normal bowel sounds heard. Central nervous system: Alert and oriented.  Extremities: Bilateral BKA  Skin: Stage 2 sacral ulcer  Psychiatry: Judgement and insight appear normal.     Data Reviewed: I have personally reviewed following labs and imaging studies  CBC:  Recent Labs Lab 07/01/16 1722 07/02/16 0229 07/03/16 [id number]  WBC 22.4* 19.7* 11.8*  NEUTROABS 19.1*  --  9.2*  HGB 13.7 12.3* 11.4*  HCT 40.4 37.0* 34.3*  MCV 79.7 80.1 79.6  PLT 139* 132* 123456*   Basic Metabolic Panel:  Recent Labs Lab 07/01/16 1722 07/02/16 0229 07/03/16 0514  NA 139 140 141  K 3.7 3.8 4.2  CL 108 113* 112*  CO2 21* 19* 23  GLUCOSE 98 114* 107*  BUN 29* 24* 11  CREATININE 1.23 0.95 0.80  CALCIUM 8.9 7.9* 8.4*  MG  --   --  1.7  PHOS  --   --  1.6*   GFR: Estimated Creatinine Clearance: 78 mL/min (by C-G formula based on SCr of 0.8 mg/dL). Liver Function Tests:  Recent Labs Lab 07/01/16 1722  AST 25  ALT 19  ALKPHOS 49  BILITOT 1.2  PROT 6.6  ALBUMIN 3.1*    Recent  Labs Lab 07/01/16 2041  LIPASE 15  AMYLASE 50   No results for input(s): AMMONIA in the last 168 hours. Coagulation Profile:  Recent Labs Lab 07/01/16 1722  INR 1.27   Cardiac Enzymes:  Recent Labs Lab 07/01/16 1734  TROPONINI 0.32*   BNP (last 3 results) No results for input(s): PROBNP in the last 8760 hours. HbA1C: No results for input(s): HGBA1C in the last 72 hours. CBG:  Recent Labs Lab 07/02/16 1135 07/02/16 1636 07/02/16 2138 07/03/16 0804 07/03/16 1212  GLUCAP 137* 154* 192* 94 162*   Lipid Profile: No results for input(s): CHOL, HDL, LDLCALC, TRIG, CHOLHDL, LDLDIRECT in the last 72 hours. Thyroid Function Tests:  Recent Labs  07/01/16 2152  TSH 0.233*   Anemia Panel: No results for input(s): VITAMINB12, FOLATE, FERRITIN, TIBC, IRON, RETICCTPCT in the last 72 hours. Sepsis Labs:  Recent Labs Lab 07/01/16 1747  LATICACIDVEN 1.80    Recent Results (from the past 240 hour(s))  Culture, blood (Routine x 2)     Status: None (Preliminary result)   Collection Time: 07/01/16  5:22 PM  Result Value Ref Range Status   Specimen Description BLOOD RIGHT ANTECUBITAL  Final   Special Requests BOTTLES DRAWN AEROBIC AND ANAEROBIC 5ML  Final   Culture NO GROWTH 2 DAYS  Final   Report Status PENDING  Incomplete  Culture, blood (Routine x 2)     Status: None (Preliminary result)   Collection Time: 07/01/16  6:16 PM  Result Value Ref Range Status   Specimen Description BLOOD LEFT ANTECUBITAL  Final   Special Requests BOTTLES DRAWN AEROBIC ONLY 5CC  Final   Culture  Setup Time   Final    AEROBIC BOTTLE ONLY GRAM POSITIVE COCCI IN CLUSTERS CRITICAL RESULT CALLED TO, READ BACK BY AND VERIFIED WITH: LPablo Lawrence.D. 16:25 07/02/16 ([email or web address])    Culture GRAM POSITIVE COCCI  Final   Report Status PENDING  Incomplete  Blood Culture ID Panel (Reflexed)     Status: Abnormal   Collection Time: 07/01/16  6:16 PM  Result Value Ref Range Status   Enterococcus  species NOT DETECTED NOT DETECTED Final   Listeria monocytogenes NOT DETECTED NOT DETECTED Final   Staphylococcus species DETECTED (A) NOT DETECTED Final    Comment: CRITICAL RESULT CALLED TO, READ BACK BY AND VERIFIED WITH: LPablo Lawrence.D. 16:25 07/02/16 ([email or web address])    Staphylococcus aureus NOT DETECTED NOT DETECTED Final   Methicillin resistance NOT DETECTED NOT DETECTED Final   Streptococcus species NOT DETECTED NOT DETECTED Final   Streptococcus agalactiae NOT DETECTED NOT DETECTED Final   Streptococcus pneumoniae NOT  DETECTED NOT DETECTED Final   Streptococcus pyogenes NOT DETECTED NOT DETECTED Final   Acinetobacter baumannii NOT DETECTED NOT DETECTED Final   Enterobacteriaceae species NOT DETECTED NOT DETECTED Final   Enterobacter cloacae complex NOT DETECTED NOT DETECTED Final   Escherichia coli NOT DETECTED NOT DETECTED Final   Klebsiella oxytoca NOT DETECTED NOT DETECTED Final   Klebsiella pneumoniae NOT DETECTED NOT DETECTED Final   Proteus species NOT DETECTED NOT DETECTED Final   Serratia marcescens NOT DETECTED NOT DETECTED Final   Haemophilus influenzae NOT DETECTED NOT DETECTED Final   Neisseria meningitidis NOT DETECTED NOT DETECTED Final   Pseudomonas aeruginosa NOT DETECTED NOT DETECTED Final   Candida albicans NOT DETECTED NOT DETECTED Final   Candida glabrata NOT DETECTED NOT DETECTED Final   Candida krusei NOT DETECTED NOT DETECTED Final   Candida parapsilosis NOT DETECTED NOT DETECTED Final   Candida tropicalis NOT DETECTED NOT DETECTED Final  Urine culture     Status: Abnormal (Preliminary result)   Collection Time: 07/01/16  9:17 PM  Result Value Ref Range Status   Specimen Description URINE, CATHETERIZED  Final   Special Requests NONE  Final   Culture >=100,000 COLONIES/mL KLEBSIELLA PNEUMONIAE (A)  Final   Report Status PENDING  Incomplete  MRSA PCR Screening     Status: None   Collection Time: 07/01/16 10:28 PM  Result Value Ref Range Status   MRSA by  PCR NEGATIVE NEGATIVE Final    Comment:        The GeneXpert MRSA Assay (FDA approved for NASAL specimens only), is one component of a comprehensive MRSA colonization surveillance program. It is not intended to diagnose MRSA infection nor to guide or monitor treatment for MRSA infections.          Radiology Studies: Dg Chest Port 1 View  Result Date: 07/01/2016 CLINICAL DATA:  Chest pain. History of cardiomyopathy, pleural effusion, pulmonary hypertension, heart failure. EXAM: PORTABLE CHEST 1 VIEW COMPARISON:  Chest radiograph August 13, 2015 FINDINGS: The cardiac silhouette is mildly enlarged and unchanged. Patient rotated LEFT. Mediastinal silhouette is nonsuspicious. No pleural effusion or focal consolidation. Biapical pleural thickening. No pneumothorax. Osteopenia. Moderate degenerative change of the LEFT shoulder. IMPRESSION: Mild cardiomegaly, no acute pulmonary process. Electronically Signed   By: Elon Alas M.D.   On: 07/01/2016 19:02      Scheduled Meds: . cefTRIAXone (ROCEPHIN)  IV  1 g Intravenous Q24H  . heparin  5,000 Units Subcutaneous Q8H  . insulin aspart  0-9 Units Subcutaneous TID WC   Continuous Infusions: . sodium chloride 10 mL/hr at 07/03/16 1237     LOS: 2 days    Chipper Oman, MD [facility] Pager [phone]  If 7PM-7AM, please contact night-coverage [email or web address] Password [email or web address] 07/03/2016, 4:53 PM

## 2016-07-03 NOTE — Telephone Encounter (Signed)
Jaime [name] August 14, 2040. His wife [name] called regarding his medication Tramadol at [facility]. She said it has been about 2 weeks. Her cell  # is [phone]. Thank you

## 2016-07-03 NOTE — Consult Note (Signed)
   [facility] Inpatient Consult   07/03/2016  Jaime [name] [birth year] [id number]    Patient screened for [facility] Management program. Martin Majestic to bedside to speak with Jaime [name] about [facility] Care Management program. He was sleeping soundly. [facility] Care Management brochure at bedside. Of note, he has had x1 hospitalization in past 6 months. Will make inpatient RNCM aware of attempt to meet with patient.  [name], MSN-Ed, RN,BSN [facility] Liaison [phone]

## 2016-07-03 NOTE — Telephone Encounter (Signed)
Called pharmacy and they have not received a prescription since August.  Called in another for #30 with 1 refill.  Left message notifying Jaime [name].

## 2016-07-04 DIAGNOSIS — I5022 Chronic systolic (congestive) heart failure: Secondary | ICD-10-CM

## 2016-07-04 LAB — CBC WITH DIFFERENTIAL/PLATELET
Basophils Absolute: 0 10*3/uL (ref 0.0–0.1)
Basophils Relative: 0 %
EOS ABS: 0.1 10*3/uL (ref 0.0–0.7)
Eosinophils Relative: 2 %
HEMATOCRIT: 32.9 % — AB (ref 39.0–52.0)
HEMOGLOBIN: 11 g/dL — AB (ref 13.0–17.0)
LYMPHS ABS: 1.6 10*3/uL (ref 0.7–4.0)
LYMPHS PCT: 23 %
MCH: 26.1 pg (ref 26.0–34.0)
MCHC: 33.4 g/dL (ref 30.0–36.0)
MCV: 78 fL (ref 78.0–100.0)
MONOS PCT: 7 %
Monocytes Absolute: 0.5 10*3/uL (ref 0.1–1.0)
NEUTROS ABS: 4.8 10*3/uL (ref 1.7–7.7)
NEUTROS PCT: 68 %
Platelets: 132 10*3/uL — ABNORMAL LOW (ref 150–400)
RBC: 4.22 MIL/uL (ref 4.22–5.81)
RDW: 16.9 % — ABNORMAL HIGH (ref 11.5–15.5)
WBC: 7.1 10*3/uL (ref 4.0–10.5)

## 2016-07-04 LAB — GLUCOSE, CAPILLARY
GLUCOSE-CAPILLARY: 145 mg/dL — AB (ref 65–99)
GLUCOSE-CAPILLARY: 136 mg/dL — AB (ref 65–99)
GLUCOSE-CAPILLARY: 147 mg/dL — AB (ref 65–99)
Glucose-Capillary: 115 mg/dL — ABNORMAL HIGH (ref 65–99)

## 2016-07-04 LAB — URINE CULTURE: Culture: >=100000 — AB

## 2016-07-04 LAB — BASIC METABOLIC PANEL
ANION GAP: 8 (ref 5–15)
BUN: 6 mg/dL (ref 6–20)
CALCIUM: 8.2 mg/dL — AB (ref 8.9–10.3)
CHLORIDE: 110 mmol/L (ref 101–111)
CO2: 21 mmol/L — AB (ref 22–32)
Creatinine, Ser: 0.55 mg/dL — ABNORMAL LOW (ref 0.61–1.24)
GFR calc non Af Amer: >60 mL/min (ref >60)
Glucose, Bld: 119 mg/dL — ABNORMAL HIGH (ref 65–99)
POTASSIUM: 3.5 mmol/L (ref 3.5–5.1)
Sodium: 139 mmol/L (ref 135–145)

## 2016-07-04 LAB — MAGNESIUM: Magnesium: 1.6 mg/dL — ABNORMAL LOW (ref 1.7–2.4)

## 2016-07-04 LAB — PHOSPHORUS: Phosphorus: 2.2 mg/dL — ABNORMAL LOW (ref 2.5–4.6)

## 2016-07-04 MED ORDER — MAGNESIUM SULFATE 2 GM/50ML IV SOLN
2.0000 g | Freq: Once | INTRAVENOUS | Status: AC
  Administered 2016-07-04: 2 g via INTRAVENOUS
  Filled 2016-07-04: qty 50

## 2016-07-04 MED ORDER — DEXTROSE 5 % IV SOLN
2.0000 g | INTRAVENOUS | Status: DC
  Filled 2016-07-04: qty 2

## 2016-07-04 MED ORDER — DEXTROSE 5 % IV SOLN
2.0000 g | INTRAVENOUS | Status: DC
  Administered 2016-07-04 – 2016-07-06 (×3): 2 g via INTRAVENOUS
  Filled 2016-07-04 (×3): qty 2

## 2016-07-04 MED ORDER — POTASSIUM PHOSPHATES 15 MMOLE/5ML IV SOLN
20.0000 meq | Freq: Once | INTRAVENOUS | Status: AC
  Administered 2016-07-04: 20 meq via INTRAVENOUS
  Filled 2016-07-04: qty 4.55

## 2016-07-04 NOTE — Progress Notes (Signed)
Pharmacy Antibiotic Note Jaime [name] is a 75 y.o. male admitted on 07/01/2016 with UTI and potential CONS bacteremia.  Currently on day 3 of ceftriaxone and vancomycin.   CONS (methicillin sensitive per BCID) is now growing from aerobic bottle from both BCx's obtain on 12/11.  Repeat BCx's obtained this morning.   Plan: - Increase Ceftriaxone 2 grams IV every 24 hours for now until bacteremia can be r/o - No change in vancomycin for now; would consider stopping as BCID does not show methicillin resistant and should be covered by Ceftriaxone    Height: 5\' 11"  (180.3 cm) Weight: 151 lb 1.6 oz (68.5 kg) IBW/kg (Calculated) : 75.3  Temp (24hrs), Avg:100.2 F (37.9 C), Min:100 F (37.8 C), Max:100.3 F (37.9 C)   Recent Labs Lab 07/01/16 1722 07/01/16 1747 07/02/16 0229 07/03/16 0514 07/04/16 0444  WBC 22.4*  --  19.7* 11.8* 7.1  CREATININE 1.23  --  0.95 0.80 0.55*  LATICACIDVEN  --  1.80  --   --   --     Estimated Creatinine Clearance: 78.5 mL/min (by C-G formula based on SCr of 0.55 mg/dL (L)).    Allergies  Allergen Reactions  . Bee Venom Anaphylaxis  . Influenza Vaccines Other (See Comments)    Sick for months    Antimicrobials this admission: Zosyn 12/11>>12/12  Vancomycin 12/11>> 12/12  Resumed 12/13>  Ceftriaxone 12/12 >>  Dose adjustments this admission: n/a  Microbiology results: 12/14 BCx: px 12/11 BCx: BCID with CONS in 2/4 12/11 UCx: K. Pneumoniae only R to ampicillin  12/11 MRSA PCR: negative  Thank you for allowing pharmacy to be a part of this patient's care.  Vincenza Hews, PharmD, BCPS 07/04/2016, 10:20 AM Pager: [phone]

## 2016-07-04 NOTE — Progress Notes (Addendum)
PROGRESS NOTE [facility]   Jaime [name]   [id number] DOB: 20-Mar-1941  DOA: 07/01/2016 PCP: [name], MD   Brief Narrative:  75 year old male with past medical history of CAD, hypertension, CHF with ejection fraction of 35% and pulmonary hypertension PVD with ulceration, diabetes mellitus, dementia, and urinary retention, condom catheter use that was recently changes. Bilateral BKA. Wife brought him today because he was having trouble waking up patient. In the ED patient was found to be hypotensive and was started on levophed for blood pressure support. She was alerted by critical care team felt to be stable to be in medical surgical floor to continue treatment for septic shock secondary to UTI and bacteremia.  Subjective: Patient seen and examined. Stated that his feeling much better than yesterday has no complaints this morning. Denies chest pain, shortness of breath, abdominal pain and diarrhea.  Assessment & Plan: Septic shock secondary to UTI - sepsis physiology has resolved  Off pressors Urine culture showed Klebsiella Blood culture Staphylococcus species - updated results, showing positive in 2 bottles will continue Vanco until sensitive are back  Continue empiric antibiotics - adjust pending sensitivities D/c foley cath  Repeat blood cultures 12/14  HTN - stable  Continue to hold BP meds , as BP increases will resume home meds Monitor BP   CAD - no chest pain  Cont ASA  Chronic systolic and diastolic CHF - EF 35 %  No signs of fluid overload at this time  Will resume Lasix as patient has been positive on I&O's  CHF compensated at this point.   DM type II  Holding home basal insulin, patient not eating well, CBGs stable  Continue SSI  Monitor CBGs  DVT prophylaxis: Lovenox  Code Status: Full Family Communication: None at bedside  Disposition Plan: Anticipate discharge to previous environment when medically stable   Consultants:   PCCM    Procedures:   None   Antimicrobials:  Ceftriaxone 12/11->   Vanco 12/13 ->   Objective: Vitals:   07/03/16 1429 07/03/16 2121 07/04/16 0525 07/04/16 0656  BP: 122/67 122/64 130/60   Pulse: 75 69 68   Resp: 18 19 16    Temp:  100.3 F (37.9 C) 100 F (37.8 C)   TempSrc:   Oral   SpO2: 99% 100% 100%   Weight:    68.5 kg (151 lb 1.6 oz)  Height:        Intake/Output Summary (Last 24 hours) at 07/04/16 1343 Last data filed at 07/04/16 0900  Gross per 24 hour  Intake             1040 ml  Output              570 ml  Net              470 ml   Filed Weights   07/02/16 1534 07/03/16 0528 07/04/16 0656  Weight: 67 kg (147 lb 12.8 oz) 68.1 kg (150 lb 1.6 oz) 68.5 kg (151 lb 1.6 oz)    Examination: - No changes in physical exam   General exam: Appears calm and comfortable  Respiratory system: Clear to auscultation. No wheezes,crackle or rhonchi Cardiovascular system: S1 & S2 heard, RRR. No JVD, murmurs, rubs or gallops Gastrointestinal system: Soft and nontender. Normal bowel sounds heard. Central nervous system: Alert and oriented.  Extremities: Bilateral BKA  Skin: Stage 2 sacral ulcer     Data Reviewed: I have personally reviewed following labs and imaging studies  CBC:  Recent Labs Lab 07/01/16 1722 07/02/16 0229 07/03/16 0514 07/04/16 0444  WBC 22.4* 19.7* 11.8* 7.1  NEUTROABS 19.1*  --  9.2* 4.8  HGB 13.7 12.3* 11.4* 11.0*  HCT 40.4 37.0* 34.3* 32.9*  MCV 79.7 80.1 79.6 78.0  PLT 139* 132* 120* [id number]*   Basic Metabolic Panel:  Recent Labs Lab 07/01/16 1722 07/02/16 0229 07/03/16 0514 07/04/16 0444  NA 139 140 141 139  K 3.7 3.8 4.2 3.5  CL 108 113* 112* 110  CO2 21* 19* 23 21*  GLUCOSE 98 114* 107* 119*  BUN 29* 24* 11 6  CREATININE 1.23 0.95 0.80 0.55*  CALCIUM 8.9 7.9* 8.4* 8.2*  MG  --   --  1.7 1.6*  PHOS  --   --  1.6* 2.2*   GFR: Estimated Creatinine Clearance: 78.5 mL/min (by C-G formula based on SCr of 0.55 mg/dL (L)). Liver  Function Tests:  Recent Labs Lab 07/01/16 1722  AST 25  ALT 19  ALKPHOS 49  BILITOT 1.2  PROT 6.6  ALBUMIN 3.1*    Recent Labs Lab 07/01/16 2041  LIPASE 15  AMYLASE 50   No results for input(s): AMMONIA in the last 168 hours. Coagulation Profile:  Recent Labs Lab 07/01/16 1722  INR 1.27   Cardiac Enzymes:  Recent Labs Lab 07/01/16 1734  TROPONINI 0.32*   BNP (last 3 results) No results for input(s): PROBNP in the last 8760 hours. HbA1C: No results for input(s): HGBA1C in the last 72 hours. CBG:  Recent Labs Lab 07/03/16 1212 07/03/16 1810 07/03/16 2119 07/04/16 0813 07/04/16 1228  GLUCAP 162* 195* 194* 115* 145*   Lipid Profile: No results for input(s): CHOL, HDL, LDLCALC, TRIG, CHOLHDL, LDLDIRECT in the last 72 hours. Thyroid Function Tests:  Recent Labs  07/01/16 2152  TSH 0.233*   Anemia Panel: No results for input(s): VITAMINB12, FOLATE, FERRITIN, TIBC, IRON, RETICCTPCT in the last 72 hours. Sepsis Labs:  Recent Labs Lab 07/01/16 1747  LATICACIDVEN 1.80    Recent Results (from the past 240 hour(s))  Culture, blood (Routine x 2)     Status: None (Preliminary result)   Collection Time: 07/01/16  5:22 PM  Result Value Ref Range Status   Specimen Description BLOOD RIGHT ANTECUBITAL  Final   Special Requests BOTTLES DRAWN AEROBIC AND ANAEROBIC 5ML  Final   Culture  Setup Time   Final    GRAM POSITIVE COCCI IN CLUSTERS AEROBIC BOTTLE ONLY CRITICAL VALUE NOTED.  VALUE IS CONSISTENT WITH PREVIOUSLY REPORTED AND CALLED VALUE.    Culture NO GROWTH 2 DAYS  Final   Report Status PENDING  Incomplete  Culture, blood (Routine x 2)     Status: None (Preliminary result)   Collection Time: 07/01/16  6:16 PM  Result Value Ref Range Status   Specimen Description BLOOD LEFT ANTECUBITAL  Final   Special Requests BOTTLES DRAWN AEROBIC ONLY 5CC  Final   Culture  Setup Time   Final    AEROBIC BOTTLE ONLY GRAM POSITIVE COCCI IN CLUSTERS CRITICAL  RESULT CALLED TO, READ BACK BY AND VERIFIED WITH: LPablo Lawrence.D. 16:25 07/02/16 ([email or web address])    Culture GRAM POSITIVE COCCI  Final   Report Status PENDING  Incomplete  Blood Culture ID Panel (Reflexed)     Status: Abnormal   Collection Time: 07/01/16  6:16 PM  Result Value Ref Range Status   Enterococcus species NOT DETECTED NOT DETECTED Final   Listeria monocytogenes NOT DETECTED NOT DETECTED Final   Staphylococcus species  DETECTED (A) NOT DETECTED Final    Comment: CRITICAL RESULT CALLED TO, READ BACK BY AND VERIFIED WITH: LPablo Lawrence.D. 16:25 07/02/16 ([email or web address])    Staphylococcus aureus NOT DETECTED NOT DETECTED Final   Methicillin resistance NOT DETECTED NOT DETECTED Final   Streptococcus species NOT DETECTED NOT DETECTED Final   Streptococcus agalactiae NOT DETECTED NOT DETECTED Final   Streptococcus pneumoniae NOT DETECTED NOT DETECTED Final   Streptococcus pyogenes NOT DETECTED NOT DETECTED Final   Acinetobacter baumannii NOT DETECTED NOT DETECTED Final   Enterobacteriaceae species NOT DETECTED NOT DETECTED Final   Enterobacter cloacae complex NOT DETECTED NOT DETECTED Final   Escherichia coli NOT DETECTED NOT DETECTED Final   Klebsiella oxytoca NOT DETECTED NOT DETECTED Final   Klebsiella pneumoniae NOT DETECTED NOT DETECTED Final   Proteus species NOT DETECTED NOT DETECTED Final   Serratia marcescens NOT DETECTED NOT DETECTED Final   Haemophilus influenzae NOT DETECTED NOT DETECTED Final   Neisseria meningitidis NOT DETECTED NOT DETECTED Final   Pseudomonas aeruginosa NOT DETECTED NOT DETECTED Final   Candida albicans NOT DETECTED NOT DETECTED Final   Candida glabrata NOT DETECTED NOT DETECTED Final   Candida krusei NOT DETECTED NOT DETECTED Final   Candida parapsilosis NOT DETECTED NOT DETECTED Final   Candida tropicalis NOT DETECTED NOT DETECTED Final  Urine culture     Status: Abnormal   Collection Time: 07/01/16  9:17 PM  Result Value Ref Range Status   Specimen  Description URINE, CATHETERIZED  Final   Special Requests NONE  Final   Culture >=100,000 COLONIES/mL KLEBSIELLA PNEUMONIAE (A)  Final   Report Status 07/04/2016 FINAL  Final   Organism ID, Bacteria KLEBSIELLA PNEUMONIAE (A)  Final      Susceptibility   Klebsiella pneumoniae - MIC*    AMPICILLIN 16 RESISTANT Resistant     CEFAZOLIN <=4 SENSITIVE Sensitive     CEFTRIAXONE <=1 SENSITIVE Sensitive     CIPROFLOXACIN <=0.25 SENSITIVE Sensitive     GENTAMICIN <=1 SENSITIVE Sensitive     IMIPENEM <=0.25 SENSITIVE Sensitive     NITROFURANTOIN 32 SENSITIVE Sensitive     TRIMETH/SULFA <=20 SENSITIVE Sensitive     AMPICILLIN/SULBACTAM 4 SENSITIVE Sensitive     PIP/TAZO <=4 SENSITIVE Sensitive     Extended ESBL NEGATIVE Sensitive     * >=100,000 COLONIES/mL KLEBSIELLA PNEUMONIAE  MRSA PCR Screening     Status: None   Collection Time: 07/01/16 10:28 PM  Result Value Ref Range Status   MRSA by PCR NEGATIVE NEGATIVE Final    Comment:        The GeneXpert MRSA Assay (FDA approved for NASAL specimens only), is one component of a comprehensive MRSA colonization surveillance program. It is not intended to diagnose MRSA infection nor to guide or monitor treatment for MRSA infections.      Radiology Studies: No results found.    Scheduled Meds: . aspirin EC  81 mg Oral Daily  . cefTRIAXone (ROCEPHIN)  IV  2 g Intravenous Q24H  . heparin  5,000 Units Subcutaneous Q8H  . insulin aspart  0-9 Units Subcutaneous TID WC  . potassium phosphate IVPB (mEq)  20 mEq Intravenous Once  . vancomycin  1,000 mg Intravenous Q12H   Continuous Infusions: . sodium chloride 10 mL/hr at 07/03/16 1237     LOS: 3 days    Chipper Oman, MD [facility] Pager [phone]  If 7PM-7AM, please contact night-coverage [email or web address] Password [email or web address] 07/04/2016, 1:43 PM

## 2016-07-05 ENCOUNTER — Encounter (HOSPITAL_COMMUNITY): Payer: Self-pay

## 2016-07-05 LAB — CBC WITH DIFFERENTIAL/PLATELET
Basophils Absolute: 0 10*3/uL (ref 0.0–0.1)
Basophils Relative: 1 %
EOS PCT: 5 %
Eosinophils Absolute: 0.3 10*3/uL (ref 0.0–0.7)
HCT: 35.6 % — ABNORMAL LOW (ref 39.0–52.0)
HEMOGLOBIN: 11.9 g/dL — AB (ref 13.0–17.0)
LYMPHS ABS: 1.8 10*3/uL (ref 0.7–4.0)
LYMPHS PCT: 34 %
MCH: 26.6 pg (ref 26.0–34.0)
MCHC: 33.4 g/dL (ref 30.0–36.0)
MCV: 79.5 fL (ref 78.0–100.0)
MONOS PCT: 9 %
Monocytes Absolute: 0.5 10*3/uL (ref 0.1–1.0)
NEUTROS PCT: 52 %
Neutro Abs: 2.8 10*3/uL (ref 1.7–7.7)
Platelets: 149 10*3/uL — ABNORMAL LOW (ref 150–400)
RBC: 4.48 MIL/uL (ref 4.22–5.81)
RDW: 17.3 % — ABNORMAL HIGH (ref 11.5–15.5)
WBC: 5.4 10*3/uL (ref 4.0–10.5)

## 2016-07-05 LAB — VANCOMYCIN, TROUGH: VANCOMYCIN TR: 20 ug/mL (ref 15–20)

## 2016-07-05 LAB — RENAL FUNCTION PANEL
ANION GAP: 7 (ref 5–15)
Albumin: 2.4 g/dL — ABNORMAL LOW (ref 3.5–5.0)
BUN: <5 mg/dL — ABNORMAL LOW (ref 6–20)
CHLORIDE: 106 mmol/L (ref 101–111)
CO2: 26 mmol/L (ref 22–32)
Calcium: 8.4 mg/dL — ABNORMAL LOW (ref 8.9–10.3)
Creatinine, Ser: 0.65 mg/dL (ref 0.61–1.24)
GFR calc non Af Amer: >60 mL/min (ref >60)
Glucose, Bld: 93 mg/dL (ref 65–99)
Phosphorus: 3.1 mg/dL (ref 2.5–4.6)
Potassium: 3.4 mmol/L — ABNORMAL LOW (ref 3.5–5.1)
Sodium: 139 mmol/L (ref 135–145)

## 2016-07-05 LAB — GLUCOSE, CAPILLARY
GLUCOSE-CAPILLARY: 144 mg/dL — AB (ref 65–99)
GLUCOSE-CAPILLARY: 283 mg/dL — AB (ref 65–99)
Glucose-Capillary: 120 mg/dL — ABNORMAL HIGH (ref 65–99)
Glucose-Capillary: 154 mg/dL — ABNORMAL HIGH (ref 65–99)

## 2016-07-05 LAB — PHOSPHORUS: Phosphorus: 3.1 mg/dL (ref 2.5–4.6)

## 2016-07-05 LAB — MAGNESIUM: Magnesium: 2.1 mg/dL (ref 1.7–2.4)

## 2016-07-05 MED ORDER — POTASSIUM CHLORIDE CRYS ER 20 MEQ PO TBCR
40.0000 meq | EXTENDED_RELEASE_TABLET | Freq: Two times a day (BID) | ORAL | Status: DC
  Administered 2016-07-05 – 2016-07-06 (×3): 40 meq via ORAL
  Filled 2016-07-05 (×3): qty 2

## 2016-07-05 MED ORDER — FUROSEMIDE 20 MG PO TABS
20.0000 mg | ORAL_TABLET | Freq: Every day | ORAL | Status: DC
  Administered 2016-07-05 – 2016-07-06 (×2): 20 mg via ORAL
  Filled 2016-07-05 (×2): qty 1

## 2016-07-05 MED ORDER — ENSURE ENLIVE PO LIQD
237.0000 mL | Freq: Two times a day (BID) | ORAL | Status: DC
  Administered 2016-07-06: 237 mL via ORAL

## 2016-07-05 MED ORDER — LISINOPRIL 20 MG PO TABS: 20.0000 mg | ORAL_TABLET | Freq: Every day | ORAL | Status: DC

## 2016-07-05 MED ORDER — LISINOPRIL 10 MG PO TABS
10.0000 mg | ORAL_TABLET | Freq: Every day | ORAL | Status: DC
  Administered 2016-07-05 – 2016-07-06 (×2): 10 mg via ORAL
  Filled 2016-07-05 (×2): qty 1

## 2016-07-05 NOTE — Progress Notes (Signed)
PROGRESS NOTE [facility]   Jaime [name]   [id number] DOB: 1940/09/27  DOA: 07/01/2016 PCP: [name], MD   Brief Narrative:  75 year old male with past medical history of CAD, hypertension, CHF with ejection fraction of 35% and pulmonary hypertension PVD with ulceration, diabetes mellitus, dementia, and urinary retention, condom catheter use that was recently changes. Bilateral BKA. Wife brought him today because he was having trouble waking up patient. In the ED patient was found to be hypotensive and was started on levophed for blood pressure support. She was alerted by critical care team felt to be stable to be in medical surgical floor to continue treatment for septic shock secondary to UTI and bacteremia.  Subjective: Patient seen and examined. Stated that his feeling much better than yesterday has no complaints this morning. Denies chest pain, shortness of breath, abdominal pain and diarrhea.  Assessment & Plan: Septic shock secondary to UTI and bacteremia - sepsis physiology has resolved  Initially treated with levophed now off pressors Urine culture showed Klebsiella only R to ampicillin  Blood culture Staphylococcus species - updated results, showing positive in 2 bottles will continue Vanco until sensitive are back  - Pharmacy helping with abx management  Continue empiric antibiotics - adjust pending sensitivities Repeated blood cultures still pending if negative x 24 hrs will plan to d/c with oral antibiotics. Pending sensitivities from first set of blood cultures.   HTN - stable  Will resume BP meds as BP normalizing and some readings above goal  Lisinopril also on lasix   CAD - no chest pain  Cont ASA  Chronic systolic and diastolic CHF - EF 35 %  No signs of fluid overload at this time  Continue Lasix  CHF compensated at this point.   DM type II  CBGs stable  Continue SSI for now Monitor CBGs  DVT prophylaxis: Lovenox  Code Status:  Full Family Communication: None at bedside  Disposition Plan: Anticipate discharge to previous environment when medically stable possible 24 hrs  Consultants:   PCCM   Procedures:   None   Antimicrobials:  Ceftriaxone 12/11->   Vanco 12/13 ->   Objective: Vitals:   07/04/16 0656 07/04/16 1537 07/04/16 2121 07/05/16 0618  BP:  (!) 146/74 (!) 150/84 (!) 148/85  Pulse:  64 68 65  Resp:  18 18 18   Temp:  98.4 F (36.9 C) 98.7 F (37.1 C) 99.1 F (37.3 C)  TempSrc:  Oral Oral Oral  SpO2:  100% 100% 100%  Weight: 68.5 kg (151 lb 1.6 oz)   69 kg (152 lb 3.2 oz)  Height:        Intake/Output Summary (Last 24 hours) at 07/05/16 1333 Last data filed at 07/05/16 1048  Gross per 24 hour  Intake           866.67 ml  Output             1600 ml  Net          -733.33 ml   Filed Weights   07/03/16 0528 07/04/16 0656 07/05/16 0618  Weight: 68.1 kg (150 lb 1.6 oz) 68.5 kg (151 lb 1.6 oz) 69 kg (152 lb 3.2 oz)    Examination:   General exam: NAD, sitting in bed having breakfast  Respiratory system: CTA b/l no rhonchi Cardiovascular system: S1S2 no murmurs  Gastrointestinal system: Soft NTND Extremities: Bilateral BKA  Skin: Stage 2 sacral ulcer     Data Reviewed: I have personally reviewed following  labs and imaging studies  CBC:  Recent Labs Lab 07/01/16 1722 07/02/16 0229 07/03/16 0514 07/04/16 0444 07/05/16 0418  WBC 22.4* 19.7* 11.8* 7.1 5.4  NEUTROABS 19.1*  --  9.2* 4.8 2.8  HGB 13.7 12.3* 11.4* 11.0* 11.9*  HCT 40.4 37.0* 34.3* 32.9* 35.6*  MCV 79.7 80.1 79.6 78.0 79.5  PLT 139* 132* 120* 132* [id number]*   Basic Metabolic Panel:  Recent Labs Lab 07/01/16 1722 07/02/16 0229 07/03/16 0514 07/04/16 0444 07/05/16 0418  NA 139 140 141 139 139  K 3.7 3.8 4.2 3.5 3.4*  CL 108 113* 112* 110 106  CO2 21* 19* 23 21* 26  GLUCOSE 98 114* 107* 119* 93  BUN 29* 24* 11 6 <5*  CREATININE 1.23 0.95 0.80 0.55* 0.65  CALCIUM 8.9 7.9* 8.4* 8.2* 8.4*  MG  --   --   1.7 1.6* 2.1  PHOS  --   --  1.6* 2.2* 3.1  3.1   GFR: Estimated Creatinine Clearance: 79.1 mL/min (by C-G formula based on SCr of 0.65 mg/dL). Liver Function Tests:  Recent Labs Lab 07/01/16 1722 07/05/16 0418  AST 25  --   ALT 19  --   ALKPHOS 49  --   BILITOT 1.2  --   PROT 6.6  --   ALBUMIN 3.1* 2.4*    Recent Labs Lab 07/01/16 2041  LIPASE 15  AMYLASE 50   No results for input(s): AMMONIA in the last 168 hours. Coagulation Profile:  Recent Labs Lab 07/01/16 1722  INR 1.27   Cardiac Enzymes:  Recent Labs Lab 07/01/16 1734  TROPONINI 0.32*   BNP (last 3 results) No results for input(s): PROBNP in the last 8760 hours. HbA1C: No results for input(s): HGBA1C in the last 72 hours. CBG:  Recent Labs Lab 07/04/16 1228 07/04/16 1744 07/04/16 2120 07/05/16 0725 07/05/16 1151  GLUCAP 145* 136* 147* 120* 144*   Lipid Profile: No results for input(s): CHOL, HDL, LDLCALC, TRIG, CHOLHDL, LDLDIRECT in the last 72 hours. Thyroid Function Tests: No results for input(s): TSH, T4TOTAL, FREET4, T3FREE, THYROIDAB in the last 72 hours. Anemia Panel: No results for input(s): VITAMINB12, FOLATE, FERRITIN, TIBC, IRON, RETICCTPCT in the last 72 hours. Sepsis Labs:  Recent Labs Lab 07/01/16 1747  LATICACIDVEN 1.80    Recent Results (from the past 240 hour(s))  Culture, blood (Routine x 2)     Status: None (Preliminary result)   Collection Time: 07/01/16  5:22 PM  Result Value Ref Range Status   Specimen Description BLOOD RIGHT ANTECUBITAL  Final   Special Requests BOTTLES DRAWN AEROBIC AND ANAEROBIC 5ML  Final   Culture  Setup Time   Final    GRAM POSITIVE COCCI IN CLUSTERS AEROBIC BOTTLE ONLY CRITICAL VALUE NOTED.  VALUE IS CONSISTENT WITH PREVIOUSLY REPORTED AND CALLED VALUE.    Culture GRAM POSITIVE COCCI  Final   Report Status PENDING  Incomplete  Culture, blood (Routine x 2)     Status: Abnormal   Collection Time: 07/01/16  6:16 PM  Result Value Ref  Range Status   Specimen Description BLOOD LEFT ANTECUBITAL  Final   Special Requests BOTTLES DRAWN AEROBIC ONLY 5CC  Final   Culture  Setup Time   Final    AEROBIC BOTTLE ONLY GRAM POSITIVE COCCI IN CLUSTERS CRITICAL RESULT CALLED TO, READ BACK BY AND VERIFIED WITH: LPablo Lawrence.D. 16:25 07/02/16 ([email or web address])    Culture (A)  Final    STAPHYLOCOCCUS SPECIES (COAGULASE NEGATIVE) THE SIGNIFICANCE OF ISOLATING THIS ORGANISM  FROM A SINGLE SET OF BLOOD CULTURES WHEN MULTIPLE SETS ARE DRAWN IS UNCERTAIN. PLEASE NOTIFY THE MICROBIOLOGY DEPARTMENT WITHIN ONE WEEK IF SPECIATION AND SENSITIVITIES ARE REQUIRED.    Report Status 07/04/2016 FINAL  Final  Blood Culture ID Panel (Reflexed)     Status: Abnormal   Collection Time: 07/01/16  6:16 PM  Result Value Ref Range Status   Enterococcus species NOT DETECTED NOT DETECTED Final   Listeria monocytogenes NOT DETECTED NOT DETECTED Final   Staphylococcus species DETECTED (A) NOT DETECTED Final    Comment: CRITICAL RESULT CALLED TO, READ BACK BY AND VERIFIED WITH: LPablo Lawrence.D. 16:25 07/02/16 ([email or web address])    Staphylococcus aureus NOT DETECTED NOT DETECTED Final   Methicillin resistance NOT DETECTED NOT DETECTED Final   Streptococcus species NOT DETECTED NOT DETECTED Final   Streptococcus agalactiae NOT DETECTED NOT DETECTED Final   Streptococcus pneumoniae NOT DETECTED NOT DETECTED Final   Streptococcus pyogenes NOT DETECTED NOT DETECTED Final   Acinetobacter baumannii NOT DETECTED NOT DETECTED Final   Enterobacteriaceae species NOT DETECTED NOT DETECTED Final   Enterobacter cloacae complex NOT DETECTED NOT DETECTED Final   Escherichia coli NOT DETECTED NOT DETECTED Final   Klebsiella oxytoca NOT DETECTED NOT DETECTED Final   Klebsiella pneumoniae NOT DETECTED NOT DETECTED Final   Proteus species NOT DETECTED NOT DETECTED Final   Serratia marcescens NOT DETECTED NOT DETECTED Final   Haemophilus influenzae NOT DETECTED NOT DETECTED Final    Neisseria meningitidis NOT DETECTED NOT DETECTED Final   Pseudomonas aeruginosa NOT DETECTED NOT DETECTED Final   Candida albicans NOT DETECTED NOT DETECTED Final   Candida glabrata NOT DETECTED NOT DETECTED Final   Candida krusei NOT DETECTED NOT DETECTED Final   Candida parapsilosis NOT DETECTED NOT DETECTED Final   Candida tropicalis NOT DETECTED NOT DETECTED Final  Urine culture     Status: Abnormal   Collection Time: 07/01/16  9:17 PM  Result Value Ref Range Status   Specimen Description URINE, CATHETERIZED  Final   Special Requests NONE  Final   Culture >=100,000 COLONIES/mL KLEBSIELLA PNEUMONIAE (A)  Final   Report Status 07/04/2016 FINAL  Final   Organism ID, Bacteria KLEBSIELLA PNEUMONIAE (A)  Final      Susceptibility   Klebsiella pneumoniae - MIC*    AMPICILLIN 16 RESISTANT Resistant     CEFAZOLIN <=4 SENSITIVE Sensitive     CEFTRIAXONE <=1 SENSITIVE Sensitive     CIPROFLOXACIN <=0.25 SENSITIVE Sensitive     GENTAMICIN <=1 SENSITIVE Sensitive     IMIPENEM <=0.25 SENSITIVE Sensitive     NITROFURANTOIN 32 SENSITIVE Sensitive     TRIMETH/SULFA <=20 SENSITIVE Sensitive     AMPICILLIN/SULBACTAM 4 SENSITIVE Sensitive     PIP/TAZO <=4 SENSITIVE Sensitive     Extended ESBL NEGATIVE Sensitive     * >=100,000 COLONIES/mL KLEBSIELLA PNEUMONIAE  MRSA PCR Screening     Status: None   Collection Time: 07/01/16 10:28 PM  Result Value Ref Range Status   MRSA by PCR NEGATIVE NEGATIVE Final    Comment:        The GeneXpert MRSA Assay (FDA approved for NASAL specimens only), is one component of a comprehensive MRSA colonization surveillance program. It is not intended to diagnose MRSA infection nor to guide or monitor treatment for MRSA infections.      Radiology Studies: No results found.   Scheduled Meds: . aspirin EC  81 mg Oral Daily  . cefTRIAXone (ROCEPHIN)  IV  2 g Intravenous Q24H  . heparin  5,000 Units Subcutaneous Q8H  . insulin aspart  0-9 Units Subcutaneous  TID WC  . vancomycin  1,000 mg Intravenous Q12H   Continuous Infusions: . sodium chloride 10 mL/hr at 07/03/16 1237     LOS: 4 days    Chipper Oman, MD [facility] Pager [phone]  If 7PM-7AM, please contact night-coverage [email or web address] Password [email or web address] 07/05/2016, 1:33 PM

## 2016-07-05 NOTE — Progress Notes (Signed)
Pharmacy Antibiotic Note SABASTAIN SIDOR is a 75 y.o. male admitted on 07/01/2016 with UTI and potential bacteremia.  Currently on day 4 of ceftriaxone and vancomycin for treatment.   Plan: 1. Continue ceftriaxone 2 grams IV daily for now 2. Obtain vancomycin trough tonight before 18:00 dose of vancomycin to evaluate current dosing regimen of vancomycin  3. Spoke with micro lab on 12/15 and plan to do susceptibilities on blood culture(s); would consider narrowing to cefazolin if blood cultures result as same bug    Height: 5\' 11"  (180.3 cm) Weight: 152 lb 3.2 oz (69 kg) IBW/kg (Calculated) : 75.3  Temp (24hrs), Avg:98.7 F (37.1 C), Min:98.4 F (36.9 C), Max:99.1 F (37.3 C)   Recent Labs Lab 07/01/16 1722 07/01/16 1747 07/02/16 0229 07/03/16 0514 07/04/16 0444 07/05/16 0418  WBC 22.4*  --  19.7* 11.8* 7.1 5.4  CREATININE 1.23  --  0.95 0.80 0.55* 0.65  LATICACIDVEN  --  1.80  --   --   --   --     Estimated Creatinine Clearance: 79.1 mL/min (by C-G formula based on SCr of 0.65 mg/dL).    Allergies  Allergen Reactions  . Bee Venom Anaphylaxis  . Influenza Vaccines Other (See Comments)    Sick for months    Antimicrobials this admission: Zosyn 12/11>>12/12  Vancomycin 12/11>> 12/12  Resumed 12/13>  Ceftriaxone 12/12 >>  Dose adjustments this admission: n/a  Microbiology results: 12/14 BCx: px 12/11 BCx: BCID with CONS and GPC in clusters  12/11 UCx: K. Pneumoniae only R to ampicillin  12/11 MRSA PCR: negative  Thank you for allowing pharmacy to be a part of this patient's care.  Vincenza Hews, PharmD, BCPS 07/05/2016, 11:13 AM Pager: [phone]

## 2016-07-05 NOTE — Progress Notes (Signed)
Pharmacy Antibiotic Note  Jaime [name] is a 75 y.o. male admitted on 07/01/2016 with AMS.  He is currently on vancomycin and Rocephin for Kleb pneumo UTI and CoNS bacteremia.  Patient is afebrile, WBC has normalized, and renal function is relatively stable.  His vancomycin trough is therapeutic.  It was drawn an hour early, but expect true through to remain within desired range.   Plan: - Continue vanc 1gm IV Q12H - Continue Rocephin 2gm IV Q24H - Monitor renal fxn, micro data, repeat vanc trough to r/o accumulation - F/U resuming home meds as feasible   Height: 5\' 11"  (180.3 cm) Weight: 152 lb 3.2 oz (69 kg) IBW/kg (Calculated) : 75.3  Temp (24hrs), Avg:99.1 F (37.3 C), Min:98.7 F (37.1 C), Max:99.4 F (37.4 C)   Recent Labs Lab 07/01/16 1722 07/01/16 1747 07/02/16 0229 07/03/16 0514 07/04/16 0444 07/05/16 0418 07/05/16 1657  WBC 22.4*  --  19.7* 11.8* 7.1 5.4  --   CREATININE 1.23  --  0.95 0.80 0.55* 0.65  --   LATICACIDVEN  --  1.80  --   --   --   --   --   VANCOTROUGH  --   --   --   --   --   --  20    Estimated Creatinine Clearance: 79.1 mL/min (by C-G formula based on SCr of 0.65 mg/dL).    Allergies  Allergen Reactions  . Bee Venom Anaphylaxis  . Influenza Vaccines Other (See Comments)    Sick for months     Zosyn 12/11 >> 12/12 CTX 12/12 >> Vanc 12/11>> 12/12, resumed 12/13 >>  12/15 VT = 20 mcg/mL on 1g q12 (drawn 1 hr early) >> no change  12/14: BCx: NGTD 12/11 BCx: CoNS (BCID staph spp) 12/11 UCx: K. pneumoniae  12/11 MRSA PCR: negative   Filmore Molyneux D. Mina Marble, PharmD, BCPS Pager:  [phone] 07/05/2016, 6:36 PM

## 2016-07-05 NOTE — Care Management Important Message (Signed)
Important Message  Patient Details  Name: Jaime [name] MRN: [id number] Date of Birth: [birth year]   Medicare Important Message Given:  Yes    Nathen May 07/05/2016, 10:06 AM

## 2016-07-06 DIAGNOSIS — R7881 Bacteremia: Secondary | ICD-10-CM

## 2016-07-06 LAB — BASIC METABOLIC PANEL
Anion gap: 7 (ref 5–15)
BUN: 6 mg/dL (ref 6–20)
CHLORIDE: 108 mmol/L (ref 101–111)
CO2: 23 mmol/L (ref 22–32)
CREATININE: 0.6 mg/dL — AB (ref 0.61–1.24)
Calcium: 8.8 mg/dL — ABNORMAL LOW (ref 8.9–10.3)
GFR calc Af Amer: >60 mL/min (ref >60)
GFR calc non Af Amer: >60 mL/min (ref >60)
Glucose, Bld: 98 mg/dL (ref 65–99)
Potassium: 4.6 mmol/L (ref 3.5–5.1)
SODIUM: 138 mmol/L (ref 135–145)

## 2016-07-06 LAB — CBC WITH DIFFERENTIAL/PLATELET
Basophils Absolute: 0 10*3/uL (ref 0.0–0.1)
Basophils Relative: 1 %
EOS ABS: 0.3 10*3/uL (ref 0.0–0.7)
Eosinophils Relative: 5 %
HEMATOCRIT: 35.5 % — AB (ref 39.0–52.0)
HEMOGLOBIN: 11.9 g/dL — AB (ref 13.0–17.0)
LYMPHS ABS: 2 10*3/uL (ref 0.7–4.0)
Lymphocytes Relative: 38 %
MCH: 26.4 pg (ref 26.0–34.0)
MCHC: 33.5 g/dL (ref 30.0–36.0)
MCV: 78.9 fL (ref 78.0–100.0)
MONO ABS: 0.7 10*3/uL (ref 0.1–1.0)
MONOS PCT: 14 %
NEUTROS PCT: 43 %
Neutro Abs: 2.3 10*3/uL (ref 1.7–7.7)
Platelets: 171 10*3/uL (ref 150–400)
RBC: 4.5 MIL/uL (ref 4.22–5.81)
RDW: 17 % — ABNORMAL HIGH (ref 11.5–15.5)
WBC: 5.3 10*3/uL (ref 4.0–10.5)

## 2016-07-06 LAB — GLUCOSE, CAPILLARY
Glucose-Capillary: 114 mg/dL — ABNORMAL HIGH (ref 65–99)
Glucose-Capillary: 139 mg/dL — ABNORMAL HIGH (ref 65–99)

## 2016-07-06 MED ORDER — CARVEDILOL 3.125 MG PO TABS: 3.1250 mg | ORAL_TABLET | Freq: Two times a day (BID) | ORAL | 0 refills | Status: DC

## 2016-07-06 MED ORDER — CARVEDILOL 3.125 MG PO TABS: 3.1250 mg | ORAL_TABLET | Freq: Two times a day (BID) | ORAL | Status: DC

## 2016-07-06 MED ORDER — DOXYCYCLINE HYCLATE 50 MG PO CAPS: 50.0000 mg | ORAL_CAPSULE | Freq: Two times a day (BID) | ORAL | 0 refills | Status: AC

## 2016-07-06 NOTE — Progress Notes (Signed)
Nutrition Brief Note  Patient identified on the Malnutrition Screening Tool (MST) Report. Patient with no recent weight loss per review of usual weights. Nutrition focused physical exam completed.  No muscle or subcutaneous fat depletion noticed.  Ht Readings from Last 3 Encounters:  07/02/16 5\' 11"  (1.803 m)  11/02/14 5' (1.524 m)  10/25/14 5' (1.524 m)    Wt Readings from Last 15 Encounters:  07/06/16 146 lb 12.8 oz (66.6 kg)  08/28/15 140 lb (63.5 kg)  08/13/15 140 lb 6.9 oz (63.7 kg)  11/02/14 140 lb 10.5 oz (63.8 kg)  10/25/14 146 lb (66.2 kg)  08/22/14 159 lb 9.8 oz (72.4 kg)  08/19/13 148 lb (67.1 kg)  08/13/13 148 lb (67.1 kg)  08/03/13 148 lb (67.1 kg)  11/30/12 140 lb (63.5 kg)  08/03/12 153 lb (69.4 kg)  07/16/12 153 lb (69.4 kg)  07/03/12 153 lb (69.4 kg)  06/11/12 153 lb (69.4 kg)  05/28/12 148 lb (67.1 kg)    BMI = 24.4 (adjusted due to bilateral AKA's) Patient meets criteria for normal weight based on current BMI.   Current diet order is heart healthy CHO modified, patient reports good intake, however, he is confused. Labs and medications reviewed.   No nutrition interventions warranted at this time. If nutrition issues arise, please consult RD.   Molli Barrows, RD, LDN, [place] Pager [phone] After Hours Pager [phone]

## 2016-07-06 NOTE — Progress Notes (Signed)
Jaime [name] discharged Home with wife per MD order.  Discharge instructions reviewed and discussed with the patient and wife, all questions and concerns answered. Copy of instructions and scripts given to patient.  Allergies as of 07/06/2016      Reactions   Bee Venom Anaphylaxis   Influenza Vaccines Other (See Comments)   Sick for months      Medication List    STOP taking these medications   mupirocin ointment 2 % Commonly known as:  BACTROBAN     TAKE these medications   albuterol 108 (90 Base) MCG/ACT inhaler Commonly known as:  PROVENTIL HFA;VENTOLIN HFA Inhale 2 puffs into the lungs every 6 (six) hours as needed for wheezing or shortness of breath.   aspirin EC 325 MG tablet Take 325 mg by mouth daily.   atorvastatin 40 MG tablet Commonly known as:  LIPITOR Take 1 tablet (40 mg total) by mouth daily.   azelastine 0.1 % nasal spray Commonly known as:  ASTELIN Place 2 sprays into both nostrils at bedtime as needed for rhinitis. Use in each nostril as directed   baclofen 10 MG tablet Commonly known as:  LIORESAL Take 0.5 tablets (5 mg total) by mouth 2 (two) times daily.   carvedilol 3.125 MG tablet Commonly known as:  COREG Take 1 tablet (3.125 mg total) by mouth 2 (two) times daily with a meal.   diclofenac sodium 1 % Gel Commonly known as:  VOLTAREN Apply 1 application topically 2 (two) times daily as needed (pain). For pain   doxycycline 50 MG capsule Commonly known as:  VIBRAMYCIN Take 1 capsule (50 mg total) by mouth 2 (two) times daily.   furosemide 20 MG tablet Commonly known as:  LASIX Take 1 tablet (20 mg total) by mouth daily.   GLUCAGEN 1 MG Solr injection Generic drug:  glucagon Inject 1 mg into the vein once as needed for low blood sugar. Reported on 11/16/2015   insulin aspart protamine- aspart (70-30) 100 UNIT/ML injection Commonly known as:  NOVOLOG MIX 70/30 Inject 0.05 mLs (5 Units total) into the skin 2 (two) times daily with a  meal.   lisinopril 10 MG tablet Commonly known as:  PRINIVIL,ZESTRIL TAKE 1 TABLET BY MOUTH EVERY DAY   multivitamin with minerals tablet Take 1 tablet by mouth daily.   ONETOUCH DELICA LANCETS 99991111 Misc Use to help check blood sugars three times a day. Dx E11.9 What changed:  how to take this  when to take this   pantoprazole 40 MG tablet Commonly known as:  PROTONIX Take 1 tablet (40 mg total) by mouth daily with lunch.   pregabalin 150 MG capsule Commonly known as:  LYRICA Take 1 capsule (150 mg total) by mouth 2 (two) times daily.   traMADol 50 MG tablet Commonly known as:  ULTRAM Take 1 tablet (50 mg total) by mouth every 6 (six) hours as needed.   Vitamin D 2000 units Caps Take 1 capsule by mouth daily.       Patients skin is clean, dry and intact, no evidence of skin break down. IV site discontinued and catheter remains intact. Site without signs and symptoms of complications. Dressing and pressure applied.  Patient escorted to car by NT in a wheelchair,  no distress noted upon discharge.  Wynetta Emery, Kwanza Cancelliere C 07/06/2016 4:53 PM

## 2016-07-06 NOTE — Discharge Summary (Signed)
Physician Discharge Summary  Jaime [name]  [id number]  DOB: 1940/12/20  DOA: 07/01/2016 PCP: [name], MD  Admit date: 07/01/2016 Discharge date: 07/06/2016  Admitted From: Home  Disposition:  Home   Recommendations for Outpatient Follow-up:  1. Follow up with PCP in 1-2 weeks 2. Please obtain BMP/CBC in one week  Discharge Condition: Stable CODE STATUS: FULL Diet recommendation: Heart Healthy / Carb Modified   Brief/Interim Summary: 75 year old male with past medical history of CAD, hypertension, CHF with ejection fraction of 35% and pulmonary hypertension, PVD with ulceration, diabetes mellitus, dementia, and urinary retention, condom catheter use that was recently changed. Bilateral BKA. Wife brought him to the ER because he was having trouble waking up patient. In the ED patient was found to be hypotensive and was started on levophed for blood pressure support. He was evaluated by critical care team felt to be stable to be in medical surgical floor to continue treatment for septic shock secondary to UTI and bacteremia.  Subjective: Patient seen and examined, continues to do well. No complaints. No acute events overnight. Remains afebrile. BP stable back on his antihypertensive medications.   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 %  No signs of fluids overload  On  ACE needs to be titrate  Coreg added given EF titrate as tolerated  Continue Lasix  Follow up with cardiology   DM type II  Continue home insulin dose  Monitor finger sticks goal < 140 fasting  Discharge Instructions  Discharge Instructions    Call MD for:  difficulty breathing, headache or visual disturbances    Complete by:  As directed    Call MD for:  extreme fatigue    Complete by:  As directed    Call MD for:  hives    Complete by:  As directed    Call MD for:  persistant dizziness or light-headedness    Complete by:  As directed    Call MD for:  persistant nausea and vomiting    Complete by:  As directed    Call MD for:  redness, tenderness, or signs of infection (pain, swelling, redness, odor or green/yellow discharge around incision site)    Complete by:  As directed    Call MD for:  severe uncontrolled pain    Complete by:  As directed    Call MD for:  temperature >100.4    Complete by:  As directed    Diet - low sodium heart healthy    Complete by:  As directed    Increase activity slowly    Complete by:  As directed      Allergies as of 07/06/2016      Reactions   Bee Venom Anaphylaxis   Influenza Vaccines Other (See Comments)   Sick for months      Medication List    STOP  taking these medications   mupirocin ointment 2 % Commonly known as:  BACTROBAN     TAKE these medications   albuterol 108 (90 Base) MCG/ACT inhaler Commonly known as:  PROVENTIL HFA;VENTOLIN HFA Inhale 2 puffs into the lungs every 6 (six) hours as needed for wheezing or shortness of breath.   aspirin EC 325 MG tablet Take 325 mg by mouth daily.   atorvastatin 40 MG tablet Commonly known as:  LIPITOR Take 1 tablet (40 mg total) by mouth daily.   azelastine 0.1 % nasal spray Commonly known as:  ASTELIN Place 2 sprays into both nostrils at bedtime as needed for rhinitis. Use in each nostril as directed   baclofen 10 MG tablet Commonly known as:  LIORESAL Take 0.5 tablets  (5 mg total) by mouth 2 (two) times daily.   carvedilol 3.125 MG tablet Commonly known as:  COREG Take 1 tablet (3.125 mg total) by mouth 2 (two) times daily with a meal.   diclofenac sodium 1 % Gel Commonly known as:  VOLTAREN Apply 1 application topically 2 (two) times daily as needed (pain). For pain   doxycycline 50 MG capsule Commonly known as:  VIBRAMYCIN Take 1 capsule (50 mg total) by mouth 2 (two) times daily.   furosemide 20 MG tablet Commonly known as:  LASIX Take 1 tablet (20 mg total) by mouth daily.   GLUCAGEN 1 MG Solr injection Generic drug:  glucagon Inject 1 mg into the vein once as needed for low blood sugar. Reported on 11/16/2015   insulin aspart protamine- aspart (70-30) 100 UNIT/ML injection Commonly known as:  NOVOLOG MIX 70/30 Inject 0.05 mLs (5 Units total) into the skin 2 (two) times daily with a meal.   lisinopril 10 MG tablet Commonly known as:  PRINIVIL,ZESTRIL TAKE 1 TABLET BY MOUTH EVERY DAY   multivitamin with minerals tablet Take 1 tablet by mouth daily.   ONETOUCH DELICA LANCETS 99991111 Misc Use to help check blood sugars three times a day. Dx E11.9 What changed:  how to take this  when to take this   pantoprazole 40 MG tablet Commonly known as:  PROTONIX Take 1 tablet (40 mg total) by mouth daily with lunch.   pregabalin 150 MG capsule Commonly known as:  LYRICA Take 1 capsule (150 mg total) by mouth 2 (two) times daily.   traMADol 50 MG tablet Commonly known as:  ULTRAM Take 1 tablet (50 mg total) by mouth every 6 (six) hours as needed.   Vitamin D 2000 units Caps Take 1 capsule by mouth daily.       Allergies  Allergen Reactions  . Bee Venom Anaphylaxis  . Influenza Vaccines Other (See Comments)    Sick for months    Consultations:  None   Procedures/Studies: Dg Chest Port 1 View  Result Date: 07/01/2016 CLINICAL DATA:  Chest pain. History of cardiomyopathy, pleural effusion, pulmonary hypertension, heart  failure. EXAM: PORTABLE CHEST 1 VIEW COMPARISON:  Chest radiograph August 13, 2015 FINDINGS: The cardiac silhouette is mildly enlarged and unchanged. Patient rotated LEFT. Mediastinal silhouette is nonsuspicious. No pleural effusion or focal consolidation. Biapical pleural thickening. No pneumothorax. Osteopenia. Moderate degenerative change of the LEFT shoulder. IMPRESSION: Mild cardiomegaly, no acute pulmonary process. Electronically Signed   By: Elon Alas M.D.   On: 07/01/2016 19:02     Discharge Exam: Vitals:   07/05/16 2143 07/06/16 0445  BP: 110/62 (!) 153/68  Pulse: 65 73  Resp: 18 18  Temp: 98.7 F (37.1 C)  99.4 F (37.4 C)   Vitals:   07/05/16 1357 07/05/16 2143 07/06/16 0023 07/06/16 0445  BP: 125/69 110/62  (!) 153/68  Pulse: 70 65  73  Resp: 16 18  18   Temp: 99.4 F (37.4 C) 98.7 F (37.1 C)  99.4 F (37.4 C)  TempSrc: Oral Oral  Oral  SpO2: 98% 100%  100%  Weight:   66.6 kg (146 lb 12.8 oz)   Height:        General: Pt is alert, awake, not in acute distress Cardiovascular: RRR, S1/S2 +, no rubs, no gallops Respiratory: CTA bilaterally, no wheezing, no rhonchi Abdominal: Soft, NT, ND, bowel sounds + Extremities: Bilateral BKA   The results of significant diagnostics from this hospitalization (including imaging, microbiology, ancillary and laboratory) are listed below for reference.     Microbiology: Recent Results (from the past 240 hour(s))  Culture, blood (Routine x 2)     Status: Abnormal (Preliminary result)   Collection Time: 07/01/16  5:22 PM  Result Value Ref Range Status   Specimen Description BLOOD RIGHT ANTECUBITAL  Final   Special Requests BOTTLES DRAWN AEROBIC AND ANAEROBIC 5ML  Final   Culture  Setup Time   Final    GRAM POSITIVE COCCI IN CLUSTERS AEROBIC BOTTLE ONLY CRITICAL VALUE NOTED.  VALUE IS CONSISTENT WITH PREVIOUSLY REPORTED AND CALLED VALUE.    Culture (A)  Final    STAPHYLOCOCCUS SPECIES (COAGULASE NEGATIVE) REPEATING  SENSITIVITIES    Report Status PENDING  Incomplete  Culture, blood (Routine x 2)     Status: Abnormal (Preliminary result)   Collection Time: 07/01/16  6:16 PM  Result Value Ref Range Status   Specimen Description BLOOD LEFT ANTECUBITAL  Final   Special Requests BOTTLES DRAWN AEROBIC ONLY 5CC  Final   Culture  Setup Time   Final    AEROBIC BOTTLE ONLY GRAM POSITIVE COCCI IN CLUSTERS CRITICAL RESULT CALLED TO, READ BACK BY AND VERIFIED WITH: LPablo Lawrence.D. 16:25 07/02/16 ([email or web address])    Culture STAPHYLOCOCCUS SPECIES (COAGULASE NEGATIVE) (A)  Final   Report Status PENDING  Incomplete  Blood Culture ID Panel (Reflexed)     Status: Abnormal   Collection Time: 07/01/16  6:16 PM  Result Value Ref Range Status   Enterococcus species NOT DETECTED NOT DETECTED Final   Listeria monocytogenes NOT DETECTED NOT DETECTED Final   Staphylococcus species DETECTED (A) NOT DETECTED Final    Comment: CRITICAL RESULT CALLED TO, READ BACK BY AND VERIFIED WITH: LPablo Lawrence.D. 16:25 07/02/16 ([email or web address])    Staphylococcus aureus NOT DETECTED NOT DETECTED Final   Methicillin resistance NOT DETECTED NOT DETECTED Final   Streptococcus species NOT DETECTED NOT DETECTED Final   Streptococcus agalactiae NOT DETECTED NOT DETECTED Final   Streptococcus pneumoniae NOT DETECTED NOT DETECTED Final   Streptococcus pyogenes NOT DETECTED NOT DETECTED Final   Acinetobacter baumannii NOT DETECTED NOT DETECTED Final   Enterobacteriaceae species NOT DETECTED NOT DETECTED Final   Enterobacter cloacae complex NOT DETECTED NOT DETECTED Final   Escherichia coli NOT DETECTED NOT DETECTED Final   Klebsiella oxytoca NOT DETECTED NOT DETECTED Final   Klebsiella pneumoniae NOT DETECTED NOT DETECTED Final   Proteus species NOT DETECTED NOT DETECTED Final   Serratia marcescens NOT DETECTED NOT DETECTED Final   Haemophilus influenzae NOT DETECTED NOT DETECTED Final   Neisseria meningitidis NOT DETECTED NOT DETECTED Final    Pseudomonas aeruginosa NOT DETECTED NOT DETECTED Final   Candida albicans NOT DETECTED NOT DETECTED Final   Candida  glabrata NOT DETECTED NOT DETECTED Final   Candida krusei NOT DETECTED NOT DETECTED Final   Candida parapsilosis NOT DETECTED NOT DETECTED Final   Candida tropicalis NOT DETECTED NOT DETECTED Final  Urine culture     Status: Abnormal   Collection Time: 07/01/16  9:17 PM  Result Value Ref Range Status   Specimen Description URINE, CATHETERIZED  Final   Special Requests NONE  Final   Culture >=100,000 COLONIES/mL KLEBSIELLA PNEUMONIAE (A)  Final   Report Status 07/04/2016 FINAL  Final   Organism ID, Bacteria KLEBSIELLA PNEUMONIAE (A)  Final      Susceptibility   Klebsiella pneumoniae - MIC*    AMPICILLIN 16 RESISTANT Resistant     CEFAZOLIN <=4 SENSITIVE Sensitive     CEFTRIAXONE <=1 SENSITIVE Sensitive     CIPROFLOXACIN <=0.25 SENSITIVE Sensitive     GENTAMICIN <=1 SENSITIVE Sensitive     IMIPENEM <=0.25 SENSITIVE Sensitive     NITROFURANTOIN 32 SENSITIVE Sensitive     TRIMETH/SULFA <=20 SENSITIVE Sensitive     AMPICILLIN/SULBACTAM 4 SENSITIVE Sensitive     PIP/TAZO <=4 SENSITIVE Sensitive     Extended ESBL NEGATIVE Sensitive     * >=100,000 COLONIES/mL KLEBSIELLA PNEUMONIAE  MRSA PCR Screening     Status: None   Collection Time: 07/01/16 10:28 PM  Result Value Ref Range Status   MRSA by PCR NEGATIVE NEGATIVE Final    Comment:        The GeneXpert MRSA Assay (FDA approved for NASAL specimens only), is one component of a comprehensive MRSA colonization surveillance program. It is not intended to diagnose MRSA infection nor to guide or monitor treatment for MRSA infections.   Culture, blood (Routine X 2) w Reflex to ID Panel     Status: None (Preliminary result)   Collection Time: 07/04/16  8:57 AM  Result Value Ref Range Status   Specimen Description BLOOD LEFT HAND  Final   Special Requests IN PEDIATRIC BOTTLE 1 ML  Final   Culture NO GROWTH 1 DAY  Final    Report Status PENDING  Incomplete  Culture, blood (Routine X 2) w Reflex to ID Panel     Status: None (Preliminary result)   Collection Time: 07/04/16  9:03 AM  Result Value Ref Range Status   Specimen Description BLOOD LEFT HAND  Final   Special Requests IN PEDIATRIC BOTTLE 1 ML  Final   Culture NO GROWTH 1 DAY  Final   Report Status PENDING  Incomplete     Labs: BNP (last 3 results)  Recent Labs  08/13/15 1630 07/01/16 1734  BNP 208.6* 99991111*   Basic Metabolic Panel:  Recent Labs Lab 07/02/16 0229 07/03/16 0514 07/04/16 0444 07/05/16 0418 07/06/16 0525  NA 140 141 139 139 138  K 3.8 4.2 3.5 3.4* 4.6  CL 113* 112* 110 106 108  CO2 19* 23 21* 26 23  GLUCOSE 114* 107* 119* 93 98  BUN 24* 11 6 <5* 6  CREATININE 0.95 0.80 0.55* 0.65 0.60*  CALCIUM 7.9* 8.4* 8.2* 8.4* 8.8*  MG  --  1.7 1.6* 2.1  --   PHOS  --  1.6* 2.2* 3.1  3.1  --    Liver Function Tests:  Recent Labs Lab 07/01/16 1722 07/05/16 0418  AST 25  --   ALT 19  --   ALKPHOS 49  --   BILITOT 1.2  --   PROT 6.6  --   ALBUMIN 3.1* 2.4*    Recent Labs Lab 07/01/16  2041  LIPASE 15  AMYLASE 50   No results for input(s): AMMONIA in the last 168 hours. CBC:  Recent Labs Lab 07/01/16 1722 07/02/16 0229 07/03/16 0514 07/04/16 0444 07/05/16 0418 07/06/16 0525  WBC 22.4* 19.7* 11.8* 7.1 5.4 5.3  NEUTROABS 19.1*  --  9.2* 4.8 2.8 2.3  HGB 13.7 12.3* 11.4* 11.0* 11.9* 11.9*  HCT 40.4 37.0* 34.3* 32.9* 35.6* 35.5*  MCV 79.7 80.1 79.6 78.0 79.5 78.9  PLT 139* 132* 120* 132* 149* 171   Cardiac Enzymes:  Recent Labs Lab 07/01/16 1734  TROPONINI 0.32*   BNP: Invalid input(s): POCBNP CBG:  Recent Labs Lab 07/05/16 1151 07/05/16 1708 07/05/16 2141 07/06/16 0759 07/06/16 1157  GLUCAP 144* 154* 283* 114* 139*   D-Dimer No results for input(s): DDIMER in the last 72 hours. Hgb A1c No results for input(s): HGBA1C in the last 72 hours. Lipid Profile No results for input(s): CHOL,  HDL, LDLCALC, TRIG, CHOLHDL, LDLDIRECT in the last 72 hours. Thyroid function studies No results for input(s): TSH, T4TOTAL, T3FREE, THYROIDAB in the last 72 hours.  Invalid input(s): FREET3 Anemia work up No results for input(s): VITAMINB12, FOLATE, FERRITIN, TIBC, IRON, RETICCTPCT in the last 72 hours. Urinalysis    Component Value Date/Time   COLORURINE YELLOW 07/01/2016 2100   APPEARANCEUR CLOUDY (A) 07/01/2016 2100   LABSPEC 1.019 07/01/2016 2100   PHURINE 5.0 07/01/2016 2100   GLUCOSEU NEGATIVE 07/01/2016 2100   GLUCOSEU NEGATIVE 06/12/2016 1204   HGBUR MODERATE (A) 07/01/2016 2100   BILIRUBINUR NEGATIVE 07/01/2016 2100   [place] NEGATIVE 07/01/2016 2100   PROTEINUR 100 (A) 07/01/2016 2100   UROBILINOGEN 0.2 06/12/2016 1204   NITRITE NEGATIVE 07/01/2016 2100   LEUKOCYTESUR LARGE (A) 07/01/2016 2100   Sepsis Labs Invalid input(s): PROCALCITONIN,  WBC,  LACTICIDVEN Microbiology Recent Results (from the past 240 hour(s))  Culture, blood (Routine x 2)     Status: Abnormal (Preliminary result)   Collection Time: 07/01/16  5:22 PM  Result Value Ref Range Status   Specimen Description BLOOD RIGHT ANTECUBITAL  Final   Special Requests BOTTLES DRAWN AEROBIC AND ANAEROBIC 5ML  Final   Culture  Setup Time   Final    GRAM POSITIVE COCCI IN CLUSTERS AEROBIC BOTTLE ONLY CRITICAL VALUE NOTED.  VALUE IS CONSISTENT WITH PREVIOUSLY REPORTED AND CALLED VALUE.    Culture (A)  Final    STAPHYLOCOCCUS SPECIES (COAGULASE NEGATIVE) REPEATING SENSITIVITIES    Report Status PENDING  Incomplete  Culture, blood (Routine x 2)     Status: Abnormal (Preliminary result)   Collection Time: 07/01/16  6:16 PM  Result Value Ref Range Status   Specimen Description BLOOD LEFT ANTECUBITAL  Final   Special Requests BOTTLES DRAWN AEROBIC ONLY 5CC  Final   Culture  Setup Time   Final    AEROBIC BOTTLE ONLY GRAM POSITIVE COCCI IN CLUSTERS CRITICAL RESULT CALLED TO, READ BACK BY AND VERIFIED WITH: LPablo Lawrence.D. 16:25 07/02/16 ([email or web address])    Culture STAPHYLOCOCCUS SPECIES (COAGULASE NEGATIVE) (A)  Final   Report Status PENDING  Incomplete  Blood Culture ID Panel (Reflexed)     Status: Abnormal   Collection Time: 07/01/16  6:16 PM  Result Value Ref Range Status   Enterococcus species NOT DETECTED NOT DETECTED Final   Listeria monocytogenes NOT DETECTED NOT DETECTED Final   Staphylococcus species DETECTED (A) NOT DETECTED Final    Comment: CRITICAL RESULT CALLED TO, READ BACK BY AND VERIFIED WITH: LPablo Lawrence.D. 16:25 07/02/16 ([email or web address])  Staphylococcus aureus NOT DETECTED NOT DETECTED Final   Methicillin resistance NOT DETECTED NOT DETECTED Final   Streptococcus species NOT DETECTED NOT DETECTED Final   Streptococcus agalactiae NOT DETECTED NOT DETECTED Final   Streptococcus pneumoniae NOT DETECTED NOT DETECTED Final   Streptococcus pyogenes NOT DETECTED NOT DETECTED Final   Acinetobacter baumannii NOT DETECTED NOT DETECTED Final   Enterobacteriaceae species NOT DETECTED NOT DETECTED Final   Enterobacter cloacae complex NOT DETECTED NOT DETECTED Final   Escherichia coli NOT DETECTED NOT DETECTED Final   Klebsiella oxytoca NOT DETECTED NOT DETECTED Final   Klebsiella pneumoniae NOT DETECTED NOT DETECTED Final   Proteus species NOT DETECTED NOT DETECTED Final   Serratia marcescens NOT DETECTED NOT DETECTED Final   Haemophilus influenzae NOT DETECTED NOT DETECTED Final   Neisseria meningitidis NOT DETECTED NOT DETECTED Final   Pseudomonas aeruginosa NOT DETECTED NOT DETECTED Final   Candida albicans NOT DETECTED NOT DETECTED Final   Candida glabrata NOT DETECTED NOT DETECTED Final   Candida krusei NOT DETECTED NOT DETECTED Final   Candida parapsilosis NOT DETECTED NOT DETECTED Final   Candida tropicalis NOT DETECTED NOT DETECTED Final  Urine culture     Status: Abnormal   Collection Time: 07/01/16  9:17 PM  Result Value Ref Range Status   Specimen Description URINE,  CATHETERIZED  Final   Special Requests NONE  Final   Culture >=100,000 COLONIES/mL KLEBSIELLA PNEUMONIAE (A)  Final   Report Status 07/04/2016 FINAL  Final   Organism ID, Bacteria KLEBSIELLA PNEUMONIAE (A)  Final      Susceptibility   Klebsiella pneumoniae - MIC*    AMPICILLIN 16 RESISTANT Resistant     CEFAZOLIN <=4 SENSITIVE Sensitive     CEFTRIAXONE <=1 SENSITIVE Sensitive     CIPROFLOXACIN <=0.25 SENSITIVE Sensitive     GENTAMICIN <=1 SENSITIVE Sensitive     IMIPENEM <=0.25 SENSITIVE Sensitive     NITROFURANTOIN 32 SENSITIVE Sensitive     TRIMETH/SULFA <=20 SENSITIVE Sensitive     AMPICILLIN/SULBACTAM 4 SENSITIVE Sensitive     PIP/TAZO <=4 SENSITIVE Sensitive     Extended ESBL NEGATIVE Sensitive     * >=100,000 COLONIES/mL KLEBSIELLA PNEUMONIAE  MRSA PCR Screening     Status: None   Collection Time: 07/01/16 10:28 PM  Result Value Ref Range Status   MRSA by PCR NEGATIVE NEGATIVE Final    Comment:        The GeneXpert MRSA Assay (FDA approved for NASAL specimens only), is one component of a comprehensive MRSA colonization surveillance program. It is not intended to diagnose MRSA infection nor to guide or monitor treatment for MRSA infections.   Culture, blood (Routine X 2) w Reflex to ID Panel     Status: None (Preliminary result)   Collection Time: 07/04/16  8:57 AM  Result Value Ref Range Status   Specimen Description BLOOD LEFT HAND  Final   Special Requests IN PEDIATRIC BOTTLE 1 ML  Final   Culture NO GROWTH 1 DAY  Final   Report Status PENDING  Incomplete  Culture, blood (Routine X 2) w Reflex to ID Panel     Status: None (Preliminary result)   Collection Time: 07/04/16  9:03 AM  Result Value Ref Range Status   Specimen Description BLOOD LEFT HAND  Final   Special Requests IN PEDIATRIC BOTTLE 1 ML  Final   Culture NO GROWTH 1 DAY  Final   Report Status PENDING  Incomplete    Time coordinating discharge: Over 30 minutes  SIGNED:  Chipper Oman, MD  [facility] 07/06/2016, 2:23 PM Pager   If 7PM-7AM, please contact night-coverage [email or web address] Password [email or web address]

## 2016-07-07 LAB — CULTURE, BLOOD (ROUTINE X 2)

## 2016-07-08 ENCOUNTER — Emergency Department (HOSPITAL_COMMUNITY)
Admission: EM | Admit: 2016-07-08 | Discharge: 2016-07-08 | Disposition: A | Discharge status: Home / Self Care | Payer: Medicare Other | Attending: Emergency Medicine | Admitting: Emergency Medicine

## 2016-07-08 ENCOUNTER — Emergency Department (HOSPITAL_COMMUNITY): Payer: Medicare Other

## 2016-07-08 ENCOUNTER — Telehealth: Payer: Self-pay | Admitting: Cardiology

## 2016-07-08 ENCOUNTER — Telehealth: Payer: Self-pay | Admitting: Internal Medicine

## 2016-07-08 ENCOUNTER — Telehealth: Payer: Self-pay | Admitting: *Deleted

## 2016-07-08 ENCOUNTER — Encounter (HOSPITAL_COMMUNITY): Payer: Self-pay | Admitting: Emergency Medicine

## 2016-07-08 DIAGNOSIS — Z87891 Personal history of nicotine dependence: Secondary | ICD-10-CM | POA: Diagnosis not present

## 2016-07-08 DIAGNOSIS — R531 Weakness: Secondary | ICD-10-CM | POA: Diagnosis not present

## 2016-07-08 DIAGNOSIS — Z79899 Other long term (current) drug therapy: Secondary | ICD-10-CM | POA: Insufficient documentation

## 2016-07-08 DIAGNOSIS — R001 Bradycardia, unspecified: Secondary | ICD-10-CM

## 2016-07-08 DIAGNOSIS — Z8673 Personal history of transient ischemic attack (TIA), and cerebral infarction without residual deficits: Secondary | ICD-10-CM | POA: Diagnosis not present

## 2016-07-08 DIAGNOSIS — Z794 Long term (current) use of insulin: Secondary | ICD-10-CM | POA: Insufficient documentation

## 2016-07-08 DIAGNOSIS — I5042 Chronic combined systolic (congestive) and diastolic (congestive) heart failure: Secondary | ICD-10-CM | POA: Insufficient documentation

## 2016-07-08 DIAGNOSIS — I2510 Atherosclerotic heart disease of native coronary artery without angina pectoris: Secondary | ICD-10-CM | POA: Insufficient documentation

## 2016-07-08 DIAGNOSIS — R55 Syncope and collapse: Secondary | ICD-10-CM | POA: Diagnosis not present

## 2016-07-08 DIAGNOSIS — I110 Hypertensive heart disease with heart failure: Secondary | ICD-10-CM | POA: Insufficient documentation

## 2016-07-08 DIAGNOSIS — R404 Transient alteration of awareness: Secondary | ICD-10-CM | POA: Diagnosis not present

## 2016-07-08 DIAGNOSIS — R0989 Other specified symptoms and signs involving the circulatory and respiratory systems: Secondary | ICD-10-CM | POA: Diagnosis not present

## 2016-07-08 DIAGNOSIS — Z7982 Long term (current) use of aspirin: Secondary | ICD-10-CM | POA: Insufficient documentation

## 2016-07-08 LAB — I-STAT CG4 LACTIC ACID, ED: Lactic Acid, Venous: 1.2 mmol/L (ref 0.5–1.9)

## 2016-07-08 LAB — BASIC METABOLIC PANEL
Anion gap: 8 (ref 5–15)
BUN: 12 mg/dL (ref 6–20)
CALCIUM: 9.1 mg/dL (ref 8.9–10.3)
CO2: 26 mmol/L (ref 22–32)
CREATININE: 0.75 mg/dL (ref 0.61–1.24)
Chloride: 104 mmol/L (ref 101–111)
GFR calc non Af Amer: >60 mL/min (ref >60)
Glucose, Bld: 87 mg/dL (ref 65–99)
Potassium: 4.1 mmol/L (ref 3.5–5.1)
SODIUM: 138 mmol/L (ref 135–145)

## 2016-07-08 LAB — I-STAT TROPONIN, ED: TROPONIN I, POC: 0.01 ng/mL (ref 0.00–0.08)

## 2016-07-08 LAB — CBC WITH DIFFERENTIAL/PLATELET
BASOS ABS: 0 10*3/uL (ref 0.0–0.1)
BASOS PCT: 0 %
EOS ABS: 0.3 10*3/uL (ref 0.0–0.7)
Eosinophils Relative: 5 %
HCT: 37.6 % — ABNORMAL LOW (ref 39.0–52.0)
HEMOGLOBIN: 12.5 g/dL — AB (ref 13.0–17.0)
Lymphocytes Relative: 32 %
Lymphs Abs: 1.8 10*3/uL (ref 0.7–4.0)
MCH: 26.5 pg (ref 26.0–34.0)
MCHC: 33.2 g/dL (ref 30.0–36.0)
MCV: 79.7 fL (ref 78.0–100.0)
Monocytes Absolute: 0.5 10*3/uL (ref 0.1–1.0)
Monocytes Relative: 10 %
NEUTROS PCT: 53 %
Neutro Abs: 2.9 10*3/uL (ref 1.7–7.7)
Platelets: 253 10*3/uL (ref 150–400)
RBC: 4.72 MIL/uL (ref 4.22–5.81)
RDW: 17.1 % — ABNORMAL HIGH (ref 11.5–15.5)
WBC: 5.5 10*3/uL (ref 4.0–10.5)

## 2016-07-08 LAB — URINALYSIS, ROUTINE W REFLEX MICROSCOPIC
Bilirubin Urine: NEGATIVE
GLUCOSE, UA: NEGATIVE mg/dL
KETONES UR: NEGATIVE mg/dL
NITRITE: NEGATIVE
PROTEIN: NEGATIVE mg/dL
Specific Gravity, Urine: 1.004 — ABNORMAL LOW (ref 1.005–1.030)
pH: 6 (ref 5.0–8.0)

## 2016-07-08 LAB — CBG MONITORING, ED: GLUCOSE-CAPILLARY: 71 mg/dL (ref 65–99)

## 2016-07-08 MED ORDER — SODIUM CHLORIDE 0.9 % IV SOLN
Freq: Once | INTRAVENOUS | Status: AC
  Administered 2016-07-08: 16:00:00 via INTRAVENOUS

## 2016-07-08 NOTE — Telephone Encounter (Signed)
Jaime [name] is calling because she said [name] is not able to see [name] today and was told to take him back to the E/R  And to hold up on that new blood pressure medication .  She is asking that you call her back . Thanks

## 2016-07-08 NOTE — ED Triage Notes (Signed)
Pt in from home via [facility] with c/o near syncope/weakness today. Pt recently hospitalized x 1 wk 1 wk ago for urosepsis, has almost finished course of Doxycycline. Pt had no LOC, did not fall. Hx of DM, CHF B AKA's, chronic foley. Alert, hx of dementia, a&ox2. 150/90, HR 60, CBG 127

## 2016-07-08 NOTE — Telephone Encounter (Signed)
Pts wife is calling into the office to simply schedule an appt for the pt to see Dr Meda Coffee sometime soon, in the near future, for post-hospital follow-up.  Wife reports that the pt was discharged from the hospital over the weekend for septic shock/urosepsis.  Wife reports that this also placed the pt into worsening heart failure.  Wife reports that the operator offered the pt an appt for Feb. 2018. Wife would like an appt sooner than that.  Wife reports that the pts [place] just left the house, and the pt is a bit hypotensive at 90/60.  Wife reports that the [place] RN advised them to  push PO fluids aggressively to the pt, and contact the pts PCP, Dr Quay Burow, to obtain a follow-up appt for this and for post-hospital follow-up from sepsis.  Wife reports that she is getting ready to call Dr Quay Burow office now, and see if they can see the pt sometime today.  Informed the pts wife that Dr Meda Coffee has an availability this Thursday 12/21 at 1:30 pm, if they would be willing to come to that appt.  Wife verbalized she will take that appt for the pt.  Advised the pts wife to still contact the pts PCP Dr Quay Burow, for his discharge instructions advised for them to call and obtain this appt, and she could further investigate his hypotension.   Informed the wife that Dr Meda Coffee is out of the office today, but will return tomorrow.  Advised the wife to push PO fluids on the pt.  Advised the pts wife to hold the pts morning dose of coreg, and recheck his BP around noon, to see if this helped or not.  Advised the pts wife to call his PCP now, and call our office back, to ask for me, to inform me if his PCP can see him or not.  Wife verbalized understanding and agrees with this plan.  Wife very gracious for all the assistance provided.

## 2016-07-08 NOTE — ED Provider Notes (Signed)
The patient is a 75 year old male, history of diabetes, multiple sclerosis, bilateral above-the-knee amputations and a recent admission to the hospital for what was thought to be sepsis from a urinary source. He presents after being discharged from the hospital and doing quite well over the last week, hypotensive this morning after feeling like he was lightheaded and dizzy. He reports this as a vertigo feeling which is now resolved. There was no chest pain, abdominal pain, back pain, headache, blurred vision or any other complaints and at this time the patient only has a complaint of having some chest congestion which has been present ever since he was admitted to the hospital 2 weeks ago. On exam the patient has clear lung sounds, there is no rhonchi rales or wheezing, no increased work of breathing, speaks in full sentences, no labored breathing. He has clear heart sounds with mild bradycardia, ulcers or palpable at the radial arteries. Both stumps are intact, oropharynx is clear and moist, phonation is normal, neck without thyromegaly or lymphadenopathy. Abdomen is soft and nontender, urine Foley catheter is in place and appears to have clear light colored urine. Vital signs reviewed and while I am in the room the patient's blood pressure is 118/77, pulse of 52, no distress. We'll pursue a source of the patient's symptoms including infectious metabolic or cardiac. It appears that the patient does take carvedilol but he has not had this medication today. He is on doxycycline for the UTI.  D/w Dr. Loleta Books - and review of the medical record reveals that the patient had been on a beta blocker last year at this time, had some bradycardia at that time as well, that medication was discontinued and he did well. Since that time he has been taking it again and now has recurrent bradycardia. Dr. Loleta Books has seen the patient with me, we are in agreement that the patient appears well enough to go home, there is been no  hypotension, there is no other signs of abnormality on exam, he is clinically well, awake, alert, pleasant, happy and without complaints. Discussed this with the family and they are also in agreement.  Medical screening examination/treatment/procedure(s) were conducted as a shared visit with non-physician practitioner(s) and myself.  I personally evaluated the patient during the encounter.  Clinical Impression:   Final diagnoses:  Bradycardia  Weakness         Noemi Chapel, MD 07/10/16 1039

## 2016-07-08 NOTE — Telephone Encounter (Signed)
Nurse Assessment  Nurse: Orvan Seen, RN, Jacquilin Date/Time (Eastern Time): 07/08/2016 9:53:16 AM  Confirm and document reason for call. If symptomatic, describe symptoms. ---Jaime [name] has a blood pressure of 90/60. He was just released from the hospital for septic shock. He had a bladder infection and UTI. Caller states he does not have any symptoms.  Does the patient have any new or worsening symptoms? ---Yes  Will a triage be completed? ---Yes  Related visit to physician within the last 2 weeks? ---Yes  Does the PT have any chronic conditions? (i.e. diabetes, asthma, etc.) ---Yes  List chronic conditions. ---heart failure, HTN, copd  Is this a behavioral health or substance abuse call? ---No     Guidelines    Guideline Title Affirmed Question Affirmed Notes  Low Blood Pressure [id number] Systolic BP < 90 AND [id number] NOT dizzy, lightheaded or weak    Final Disposition User   See Physician within 4 Hours (or PCP triage) Orvan Seen, RN, Jacquilin    Comments  no appointments opened today, pt referred back to ED   Referrals  [facility] - ED   Disagree/Comply: Comply

## 2016-07-08 NOTE — Telephone Encounter (Signed)
Called pt/wife to get appt set-up for TCM hosp f/u Per daughter had to call EMS back today & they have taken him, back to [name]....Jaime [name]

## 2016-07-08 NOTE — ED Notes (Signed)
Pt awaiting PTAR for transport. 

## 2016-07-08 NOTE — Telephone Encounter (Signed)
Pt c/o BP issue: STAT if pt c/o blurred vision, one-sided weakness or slurred speech  1. What are your last 5 BP readings? 90/60 2. Are you having any other symptoms (ex. Dizziness, headache, blurred vision, passed out)? no 3. What is your BP issue? weak

## 2016-07-08 NOTE — Discharge Instructions (Signed)
Quit taking your coreg (carvedilol). Please keep your scheduled appointment with your primary care provider.  Return to ER for new or worsening symptoms, any additional concerns.

## 2016-07-08 NOTE — Telephone Encounter (Signed)
Wife reports that she spoke with the pts PCP Dr Quay Burow office, and they advised that she take the pt to the ER within the next 3-4 hours for IV fluids for hypotension.  Wife reports that they could not see the pt today in their clinic.  Wife reports that Dr Quay Burow agreed that the pt should hold carvedilol at this time, until otherwise advised and hypotension resolves.  Wife reports she would still like to keep the pts follow-up appt with Dr Meda Coffee on this Thursday 12/21 at 1:30 pm.  Wife reports she is going to feed the pt his lunch, then call EMS to transport him to the ER for fluid resuscitation.  Pt is wheelchair bound and wife's transportation Lucianne Lei is not working appropriately.  Informed the pts wife that I agree with Dr Eilleen Kempf advice, and I will route this message to make Dr Meda Coffee aware of pts current condition and plan.  Wife verbalized understanding and agrees with this plan.  Wife more than gracious for all the assistance provided.

## 2016-07-08 NOTE — ED Notes (Signed)
Pt needs PTAR for transport home.

## 2016-07-08 NOTE — ED Provider Notes (Signed)
[facility] DEPT Provider Note   CSN: [id number] Arrival date & time: 07/08/16  [id number]     History   Chief Complaint Chief Complaint  Patient presents with  . Near Syncope  . Weakness    HPI Jaime [name] is a 75 y.o. male.  The history is provided by the patient and medical records. No language interpreter was used.  Near Syncope  Pertinent negatives include no chest pain, no abdominal pain, no headaches and no shortness of breath.  Weakness  Pertinent negatives include no shortness of breath, no chest pain, no vomiting and no headaches.  Jaime [name] is a 75 y.o. male  with a PMH of CHF, CAD, dementia, DM2, chronic indwelling foley, bilateral AKA who presents to the Emergency Department from home after wife called EMS. At initial evaluation no family at the bedside. Patient states that wife called EMS because he was "acting funny". Patient states that he feels sleepy but that is because he stayed up too late last night. Patient also endorses chest congestion that he feels like has gotten a little worse over the last few weeks - associated with dry cough. He denies chest pain, shortness of breath, n/v, abdominal pain or any other symptoms at this time. He was admitted on 12/11 for septic shock 2/2 UTI and bacteremia. He was discharged on 12/16 and sent home with rx for doxycycline for 14 days. Patient is unsure of antibiotic use, but states to ask his wife when she arrives. He denies urinary symptoms, however does have a chronic indwelling foley.   Addendum: Wife now at bedside and states that she called EMS because he complained of being dizzy. She checked his blood pressure which was 90's/60's. She then called his doctor who told her to go to the hospital for further evaluation. He has been acting as usual and his only complaint was the dizziness per wife. Wife states that she has been giving him his medications as directed and they have missed no doses of antibiotics. He has  not taken his daily coreg today.   Past Medical History:  Diagnosis Date  . Atherosclerotic PVD with ulceration ([place])    left foot  . CAD (coronary artery disease) CARDIOLOGIST- DR KATZ-- VISIT 06-05-2011 IN [facility]   non-STEMI, 2007.Marland Kitchenoccluded circumflex.. Taxus stent placed...residual 80% LAD...50% RCA  . Cardiomyopathy, ischemic    EF 45% per ECHO 2008  //   EF 25%, echo, August, 2013 //  Echo (8/15):  Mild LVH, EF 30-35%, ant-lat and lat AK, inf HK, Gr 1 DD, mild MR, mild LAE  . Carotid artery disease ([place])    a.  Doppler, February, 2012, 0-39% bilateral,Mild smooth plaque;  b.  Carotid US (8/15):  Bilateral 1-39% ICA >>> F/u 2 years  . Chronic indwelling Foley catheter   . Constipation   . Dementia   . Diabetes mellitus    INSULIN-DEPENDent  . Fatigue SEVERE  . H/O pleural effusion 2008   POST THORACENTESIS  . History of colon polyps PRECANCEROUS  . Hyperlipidemia   . Hypertension   . Impotence   . Increased prostate specific antigen (PSA) velocity   . Insomnia   . Lung nodule    resolved 11-2006 CT Chest  . Multiple sclerosis ([place]) [place] -- LAST VISIT 11-20-2010  NOTE W/ CHART  . PAC (premature atrial contraction)    December, 2013  . Pulmonary hypertension    moderate ECHO Jan 2008  . Scoliosis  associated with other condition   . Sleep apnea   . Systolic heart failure   . TIA (transient ischemic attack)   . Tobacco abuse    quit   . Urinary retention    dx ~ 2-12, like from [place], now with a catheter, saw urology  . Urinary tract infection    hx of    Patient Active Problem List   Diagnosis Date Noted  . Shock ([place]) 07/01/2016  . Pain   . Altered mental status   . GERD (gastroesophageal reflux disease) 04/10/2016  . Coronary artery disease involving native coronary artery of native heart without angina pectoris 12/28/2015  . PAD (peripheral artery disease) ([place]) 12/28/2015  . Shortness of breath 12/28/2015  . Bradycardia 08/14/2015    . Chronic combined systolic and diastolic CHF (congestive heart failure) ([place]) 08/14/2015  . Bronchitis 08/13/2015  . Phantom limb pain ([place]) 12/08/2014  . Syncope 11/01/2014  . Coronary arteriosclerosis   . Dyspnea   . SOB (shortness of breath)   . Sepsis ([place]) 08/18/2014  . UTI (lower urinary tract infection) 08/18/2014  . 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 Paraparesis of both lower limbs ([place]) 05/31/2014  . Chronic systolic CHF (congestive heart failure) ([place]) 05/30/2014  . Scoliosis, thoracogenic, acquired 03/15/2014  . Mild cognitive impairment 02/11/2014  . PVD (peripheral vascular disease) ([place]) 08/13/2013  . Pain in limb 08/13/2013  . S/P AKA (above knee amputation) bilateral ([place]) 08/03/2012  . Cough 07/21/2012  . PAC (premature atrial contraction)   . Peripheral vascular disease, unspecified 07/03/2012  . 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 Atherosclerosis of native arteries of the extremities with ulceration(440.23) 04/17/2012  . Ejection fraction   . Cardiomyopathy, ischemic   . Pulmonary hypertension   . Urinary retention   . Hemiparesis ([place])   . Essential hypertension   . Urinary tract infection after immobility, UCX MRSA 03/26/2012  . Atherosclerotic PVD with ulceration ([place]) 03/06/2012  . CAD (coronary artery disease)   . Carotid artery disease ([place])   . SHOULDER PAIN 08/24/2010  . HYPERLIPIDEMIA 07/31/2007  . BPH (benign prostatic hyperplasia) 04/15/2007  . DMII (diabetes mellitus, type 2) ([place]) 12/18/2006    Past Surgical History:  Procedure Laterality Date  . ABDOMINAL ANGIOGRAM  12/17/2011   Procedure: ABDOMINAL ANGIOGRAM;  Surgeon: Serafina Mitchell, MD;  Location: [facility] CATH LAB;  Service: Cardiovascular;;  . ABDOMINAL AORTAGRAM N/A 08/19/2013   Procedure: ABDOMINAL Maxcine Ham;  Surgeon: Conrad [place], MD;  Location: [facility] CATH LAB;  Service: Cardiovascular;  Laterality: N/A;  . AMPUTATION   03/17/2012   Procedure: AMPUTATION ABOVE KNEE;  Surgeon: Conrad Derma, MD;  Location: [facility];  Service: Vascular;  Laterality: Left;  . AMPUTATION  06/03/2012   Procedure: AMPUTATION ABOVE KNEE;  Surgeon: Conrad [place], MD;  Location: [facility];  Service: Vascular;  Laterality: Right;  . CHOLECYSTECTOMY N/A 10/25/2014   Procedure: LAPAROSCOPIC CHOLECYSTECTOMY ;  Surgeon: Coralie Keens, MD;  Location: [facility];  Service: General;  Laterality: N/A;  . CORONARY ANGIOPLASTY WITH STENT PLACEMENT  05-01-2006   OCCLUDED CIRCUMFLEX -- TAXUS STENT PLACMENT  AND RESIDUAL 80% LAD,  50% RCA  . CYSTOSCOPY  07/30/2011   Procedure: CYSTOSCOPY;  Surgeon: Hanley Ben, MD;  Location: [facility];  Service: Urology;  Laterality: N/A;  . ERCP N/A 08/25/2014   Procedure: ENDOSCOPIC RETROGRADE CHOLANGIOPANCREATOGRAPHY (ERCP);  Surgeon: Ladene Artist, MD;  Location: [facility] ENDOSCOPY;  Service: Endoscopy;  Laterality:  N/A;  . FEMORAL-POPLITEAL BYPASS GRAFT  03/11/2012   Procedure: BYPASS GRAFT FEMORAL-POPLITEAL ARTERY;  Surgeon: Conrad [place], MD;  Location: [facility];  Service: Vascular;  Laterality: Left;  embolectomy left lower leg  . FEMORAL-TIBIAL BYPASS GRAFT  03/11/2012   Procedure: BYPASS GRAFT FEMORAL-TIBIAL ARTERY;  Surgeon: Conrad [place], MD;  Location: [facility] OR;  Service: Vascular;  Laterality: Left;  Left Femoral -Tibial trunk bypass, Endarterectomy of Tibial- Peroneal trunk with vein angioplasty.  Marland Kitchen HERNIA REPAIR  1990   (R)  . INTRAOPERATIVE ARTERIOGRAM  03/11/2012   Procedure: INTRA OPERATIVE ARTERIOGRAM;  Surgeon: Conrad [place], MD;  Location: [facility];  Service: Vascular;  Laterality: Left;  . LOWER EXTREMITY ANGIOGRAM Bilateral 12/17/2011   Procedure: LOWER EXTREMITY ANGIOGRAM;  Surgeon: Serafina Mitchell, MD;  Location: [facility] CATH LAB;  Service: Cardiovascular;  Laterality: Bilateral;  bil lower extrem angio  . THORACENTESIS  2008   PLEURAL EFFUSION  . TRANSURETHRAL RESECTION OF PROSTATE  07/30/2011   Procedure:  TRANSURETHRAL RESECTION OF THE PROSTATE (TURP);  Surgeon: Hanley Ben, MD;  Location: [facility];  Service: Urology;  Laterality: N/A;       Home Medications    Prior to Admission medications   Medication Sig Start Date End Date Taking? Authorizing Provider  aspirin EC 325 MG tablet Take 325 mg by mouth daily.   Yes Historical Provider, MD  atorvastatin (LIPITOR) 40 MG tablet Take 1 tablet (40 mg total) by mouth daily. 06/11/16  Yes [name], MD  azelastine (ASTELIN) 0.1 % nasal spray Place 2 sprays into both nostrils at bedtime as needed for rhinitis. Use in each nostril as directed 08/28/15  Yes Colon Branch, MD  baclofen (LIORESAL) 10 MG tablet Take 0.5 tablets (5 mg total) by mouth 2 (two) times daily. 01/24/16  Yes Pieter Partridge, DO  Cholecalciferol (VITAMIN D) 2000 units CAPS Take 1 capsule by mouth daily.    Yes Historical Provider, MD  diclofenac sodium (VOLTAREN) 1 % GEL Apply 1 application topically 2 (two) times daily as needed (pain). For pain 03/09/12  Yes Charlett Blake, MD  doxycycline (VIBRAMYCIN) 50 MG capsule Take 1 capsule (50 mg total) by mouth 2 (two) times daily. 07/06/16 07/18/16 Yes Doreatha Lew, MD  furosemide (LASIX) 20 MG tablet Take 1 tablet (20 mg total) by mouth daily. 04/22/16 07/21/16 Yes Dorothy Spark, MD  insulin aspart protamine- aspart (NOVOLOG MIX 70/30) (70-30) 100 UNIT/ML injection Inject 0.05 mLs (5 Units total) into the skin 2 (two) times daily with a meal. 11/03/14  Yes Belkys A Regalado, MD  lisinopril (PRINIVIL,ZESTRIL) 10 MG tablet TAKE 1 TABLET BY MOUTH EVERY DAY 04/26/16  Yes [name], MD  Multiple Vitamins-Minerals (MULTIVITAMIN WITH MINERALS) tablet Take 1 tablet by mouth daily.   Yes Historical Provider, MD  pantoprazole (PROTONIX) 40 MG tablet Take 1 tablet (40 mg total) by mouth daily with lunch. 06/11/16  Yes [name], MD  pregabalin (LYRICA) 150 MG capsule Take 1 capsule (150 mg total) by mouth 2 (two)  times daily. 02/15/16  Yes Adam Telford Nab, DO  traMADol (ULTRAM) 50 MG tablet Take 1 tablet (50 mg total) by mouth every 6 (six) hours as needed. 06/06/16  Yes Adam Telford Nab, DO  albuterol (PROVENTIL HFA;VENTOLIN HFA) 108 (90 Base) MCG/ACT inhaler Inhale 2 puffs into the lungs every 6 (six) hours as needed for wheezing or shortness of breath. 12/28/15   Dorothy Spark, MD  glucagon (GLUCAGEN) 1 MG SOLR injection Inject 1 mg into the vein once as needed for low blood sugar. Reported on 11/16/2015    Historical Provider, MD    Family History Family History  Problem Relation Age of Onset  . Heart attack Mother 32  . Heart disease Mother   . Stroke Mother   . Hyperlipidemia Mother   . Hypertension Mother   . COPD Father   . Peripheral vascular disease Father   . Diabetes Brother   . Heart disease Brother   . Hypertension Brother   . Heart attack Brother   . Diabetes Daughter   . Colon cancer Neg Hx   . Prostate cancer Neg Hx     Social History Social History  Substance Use Topics  . Smoking status: Former Smoker    Years: 50.00    Quit date: 07/22/2012  . Smokeless tobacco: Current User     Comment: pt states that he is using E-cigs  . Alcohol use No     Allergies   Bee venom; Beta adrenergic blockers; Carvedilol; and Influenza vaccines   Review of Systems Review of Systems  Constitutional: Positive for fatigue. Negative for fever.  HENT: Positive for congestion.   Eyes: Negative for visual disturbance.  Respiratory: Positive for cough. Negative for shortness of breath.   Cardiovascular: Positive for near-syncope. Negative for chest pain.  Gastrointestinal: Negative for abdominal pain and vomiting.  Genitourinary: Negative for dysuria.  Musculoskeletal: Negative for myalgias.  Neurological: Negative for headaches.  Psychiatric/Behavioral: Negative for agitation.     Physical Exam Updated Vital Signs BP 97/63   Pulse (!) 49   Temp 98.3 F (36.8 C) (Oral)   Resp 14    Wt 66.2 kg   SpO2 99%   BMI 20.36 kg/m   Physical Exam  Constitutional: He is oriented to person, place, and time.  Pleasant cooperative WDWN male in NAD.   HENT:  Head: Normocephalic and atraumatic.  Cardiovascular: Regular rhythm and normal heart sounds.   Mildly bradycardic  Pulmonary/Chest: Effort normal and breath sounds normal. No respiratory distress. He has no wheezes. He has no rales. He exhibits no tenderness.  Abdominal: Soft. He exhibits no distension. There is no tenderness.  Genitourinary:  Genitourinary Comments: Urine bag from indwelling foley with small amount of light yellow urine.  Musculoskeletal:  Bilateral AKA  Neurological: He is alert and oriented to person, place, and time.  Skin: Skin is warm and dry.  Nursing note and vitals reviewed.    ED Treatments / Results  Labs (all labs ordered are listed, but only abnormal results are displayed) Labs Reviewed  CBC WITH DIFFERENTIAL/PLATELET - Abnormal; Notable for the following:       Result Value   Hemoglobin 12.5 (*)    HCT 37.6 (*)    RDW 17.1 (*)    All other components within normal limits  URINALYSIS, ROUTINE W REFLEX MICROSCOPIC - Abnormal; Notable for the following:    Specific Gravity, Urine 1.004 (*)    Hgb urine dipstick SMALL (*)    Leukocytes, UA LARGE (*)    Bacteria, UA RARE (*)    Squamous Epithelial / LPF 0-5 (*)    Crystals PRESENT (*)    All other components within normal limits  URINE CULTURE  BASIC METABOLIC PANEL  Randolm Idol, ED  I-STAT CG4 LACTIC ACID, ED  CBG MONITORING, ED    EKG  EKG Interpretation  Date/Time:  Monday July 08 2016 19:52:53 EST Ventricular Rate:  51 PR Interval:    QRS Duration: 121 QT Interval:  522 QTC Calculation: 481 R Axis:   -27 Text Interpretation:  Sinus rhythm Prolonged PR interval Left bundle branch block Since last tracing rate slower Confirmed by MILLER  MD, BRIAN ([id number]) on 07/08/2016 8:27:10 PM       Radiology Dg  Chest Portable 1 View  Result Date: 07/08/2016 CLINICAL DATA:  Chest congestion, fever for 1 week EXAM: PORTABLE CHEST 1 VIEW COMPARISON:  07/01/2016 FINDINGS: There is bilateral mild interstitial thickening likely chronic. There is no focal parenchymal opacity. There is no pleural effusion or pneumothorax. The heart and mediastinal contours are unremarkable. The osseous structures are unremarkable. IMPRESSION: No active disease. Electronically Signed   By: Kathreen Devoid   On: 07/08/2016 16:19    Procedures Procedures (including critical care time)  Medications Ordered in ED Medications  0.9 %  sodium chloride infusion ( Intravenous New Bag/Given 07/08/16 1550)     Initial Impression / Assessment and Plan / ED Course  I have reviewed the triage vital signs and the nursing notes.  Pertinent labs & imaging results that were available during my care of the patient were reviewed by me and considered in my medical decision making (see chart for details).  Jaime [name] is a 75 y.o. male who presents to ED with wife after patient reported an episode of dizziness at home. Wife checked blood pressure and noted that it was low 90's/60's. Recently admitted to ICU for urosepsis and discharged two days ago. Urine today improved. Lab work reassuring. Normal white count, normal lactate, negative troponin and all electrolytes within defined limits. Chest x-ray with no cardiopulmonary disease. Patient has been persistently bradycardic in the ED today. Overall, patient is well-appearing and appears in no distress. Given persistent bradycardia, consulted hospitalist, Dr. Loleta Books for possible admission. He evaluated the patient as well. Chart review shows bradycardia when on beta blocker in the past. It appears that she has now restarted Coreg which is likely contributory to today's bradycardia. We will discontinue Coreg and have patient follow up this week with his physician as an outpatient. Dr. Loleta Books,  attending Dr. Sabra Heck and I agree on this plan of care. Family also is in agreement with plan as dictated above.   Patient seen by and discussed with Dr. Sabra Heck who agrees with treatment plan.    Final Clinical Impressions(s) / ED Diagnoses   Final diagnoses:  Bradycardia  Weakness    New Prescriptions New Prescriptions   No medications on file     [facility] Ashlee Bewley, PA-C 07/08/16 2132    Noemi Chapel, MD 07/10/16 1039

## 2016-07-09 LAB — URINE CULTURE

## 2016-07-09 LAB — CULTURE, BLOOD (ROUTINE X 2)
CULTURE: NO GROWTH
CULTURE: NO GROWTH

## 2016-07-10 ENCOUNTER — Ambulatory Visit: Payer: Medicare Other | Admitting: Cardiology

## 2016-07-11 ENCOUNTER — Ambulatory Visit (INDEPENDENT_AMBULATORY_CARE_PROVIDER_SITE_OTHER): Payer: Medicare Other | Admitting: Cardiology

## 2016-07-11 ENCOUNTER — Encounter: Payer: Self-pay | Admitting: Cardiology

## 2016-07-11 VITALS — BP 124/62 | HR 63

## 2016-07-11 DIAGNOSIS — R001 Bradycardia, unspecified: Secondary | ICD-10-CM

## 2016-07-11 DIAGNOSIS — R42 Dizziness and giddiness: Secondary | ICD-10-CM

## 2016-07-11 DIAGNOSIS — I10 Essential (primary) hypertension: Secondary | ICD-10-CM | POA: Diagnosis not present

## 2016-07-11 DIAGNOSIS — I255 Ischemic cardiomyopathy: Secondary | ICD-10-CM

## 2016-07-11 DIAGNOSIS — I2510 Atherosclerotic heart disease of native coronary artery without angina pectoris: Secondary | ICD-10-CM | POA: Diagnosis not present

## 2016-07-11 NOTE — Patient Instructions (Signed)
Medication Instructions:   Your physician recommends that you continue on your current medications as directed. Please refer to the Current Medication list given to you today.     Follow-Up:  3 MONTHS WITH DR NELSON       If you need a refill on your cardiac medications before your next appointment, please call your pharmacy.   

## 2016-07-11 NOTE — Progress Notes (Signed)
Cardiology Office Note    Date:  07/15/2016   ID:  Jaime [name], DOB [birth year], MRN [id number]  PCP:  [name], MD  Cardiologist:  Dr Ron Parker -->  [name], MD   No chief complaint on file.  History of Present Illness:  Jaime [name] is a 75 y.o. male with h/o MS, s/p B/L AKA, wheelchair bound, incontinence, CAD, s/p PCI to LCX in 2007, with h/o ischemic CMP, the last LVEF 35% on echo in 07/2015, mildly reduced RVEF. H/O PAD.  He used to see Dr Ron Parker, the last visit in 2015.  07/11/2016 - The patient is coming after 6 months, he is in a great mood and states that he feels great. He has been admitted on 99991111 with UTI complicated by septic shock requiring Levophed.  He Went to the ER again on 07/08/16 with dizziness, fatigue and was found to be bradycardic on BB that were held. His symptoms have improved since then. He has hard time to cough out the phlem, no fever/ chills. Denies chest pain or DOE, no palpitations or syncope. He sleeps upright in a hospital bed and has no PND. No palpitations or syncope.  Past Medical History:  Diagnosis Date  . Atherosclerotic PVD with ulceration ([place])    left foot  . CAD (coronary artery disease) CARDIOLOGIST- DR KATZ-- VISIT 06-05-2011 IN [facility]   non-STEMI, 2007.Marland Kitchenoccluded circumflex.. Taxus stent placed...residual 80% LAD...50% RCA  . Cardiomyopathy, ischemic    EF 45% per ECHO 2008  //   EF 25%, echo, August, 2013 //  Echo (8/15):  Mild LVH, EF 30-35%, ant-lat and lat AK, inf HK, Gr 1 DD, mild MR, mild LAE  . Carotid artery disease ([place])    a.  Doppler, February, 2012, 0-39% bilateral,Mild smooth plaque;  b.  Carotid US (8/15):  Bilateral 1-39% ICA >>> F/u 2 years  . Chronic indwelling Foley catheter   . Constipation   . Dementia   . Diabetes mellitus    INSULIN-DEPENDent  . Fatigue SEVERE  . H/O pleural effusion 2008   POST THORACENTESIS  . History of colon polyps PRECANCEROUS  . Hyperlipidemia   . Hypertension   .  Impotence   . Increased prostate specific antigen (PSA) velocity   . Insomnia   . Lung nodule    resolved 11-2006 CT Chest  . Multiple sclerosis ([place]) [place] -- LAST VISIT 11-20-2010  NOTE W/ CHART  . PAC (premature atrial contraction)    December, 2013  . Pulmonary hypertension    moderate ECHO Jan 2008  . Scoliosis associated with other condition   . Sleep apnea   . Systolic heart failure   . TIA (transient ischemic attack)   . Tobacco abuse    quit   . Urinary retention    dx ~ 2-12, like from [place], now with a catheter, saw urology  . Urinary tract infection    hx of    Past Surgical History:  Procedure Laterality Date  . ABDOMINAL ANGIOGRAM  12/17/2011   Procedure: ABDOMINAL ANGIOGRAM;  Surgeon: Serafina Mitchell, MD;  Location: [facility] CATH LAB;  Service: Cardiovascular;;  . ABDOMINAL AORTAGRAM N/A 08/19/2013   Procedure: ABDOMINAL Maxcine Ham;  Surgeon: Conrad Robbins, MD;  Location: [facility] CATH LAB;  Service: Cardiovascular;  Laterality: N/A;  . AMPUTATION  03/17/2012   Procedure: AMPUTATION ABOVE KNEE;  Surgeon: Conrad Bevington, MD;  Location: [place];  Service: Vascular;  Laterality:  Left;  . AMPUTATION  06/03/2012   Procedure: AMPUTATION ABOVE KNEE;  Surgeon: Conrad Stockwell, MD;  Location: [place];  Service: Vascular;  Laterality: Right;  . CHOLECYSTECTOMY N/A 10/25/2014   Procedure: LAPAROSCOPIC CHOLECYSTECTOMY ;  Surgeon: Coralie Keens, MD;  Location: [place];  Service: General;  Laterality: N/A;  . CORONARY ANGIOPLASTY WITH STENT PLACEMENT  05-01-2006   OCCLUDED CIRCUMFLEX -- TAXUS STENT PLACMENT  AND RESIDUAL 80% LAD,  50% RCA  . CYSTOSCOPY  07/30/2011   Procedure: CYSTOSCOPY;  Surgeon: Hanley Ben, MD;  Location: [facility];  Service: Urology;  Laterality: N/A;  . ERCP N/A 08/25/2014   Procedure: ENDOSCOPIC RETROGRADE CHOLANGIOPANCREATOGRAPHY (ERCP);  Surgeon: Ladene Artist, MD;  Location: [facility] ENDOSCOPY;  Service: Endoscopy;  Laterality: N/A;  .  FEMORAL-POPLITEAL BYPASS GRAFT  03/11/2012   Procedure: BYPASS GRAFT FEMORAL-POPLITEAL ARTERY;  Surgeon: Conrad Waller, MD;  Location: [place];  Service: Vascular;  Laterality: Left;  embolectomy left lower leg  . FEMORAL-TIBIAL BYPASS GRAFT  03/11/2012   Procedure: BYPASS GRAFT FEMORAL-TIBIAL ARTERY;  Surgeon: Conrad Mundelein, MD;  Location: [facility] OR;  Service: Vascular;  Laterality: Left;  Left Femoral -Tibial trunk bypass, Endarterectomy of Tibial- Peroneal trunk with vein angioplasty.  Marland Kitchen HERNIA REPAIR  1990   (R)  . INTRAOPERATIVE ARTERIOGRAM  03/11/2012   Procedure: INTRA OPERATIVE ARTERIOGRAM;  Surgeon: Conrad [place], MD;  Location: [place];  Service: Vascular;  Laterality: Left;  . LOWER EXTREMITY ANGIOGRAM Bilateral 12/17/2011   Procedure: LOWER EXTREMITY ANGIOGRAM;  Surgeon: Serafina Mitchell, MD;  Location: [facility] CATH LAB;  Service: Cardiovascular;  Laterality: Bilateral;  bil lower extrem angio  . THORACENTESIS  2008   PLEURAL EFFUSION  . TRANSURETHRAL RESECTION OF PROSTATE  07/30/2011   Procedure: TRANSURETHRAL RESECTION OF THE PROSTATE (TURP);  Surgeon: Hanley Ben, MD;  Location: [facility];  Service: Urology;  Laterality: N/A;    Current Medications: Outpatient Medications Prior to Visit  Medication Sig Dispense Refill  . albuterol (PROVENTIL HFA;VENTOLIN HFA) 108 (90 Base) MCG/ACT inhaler Inhale 2 puffs into the lungs every 6 (six) hours as needed for wheezing or shortness of breath. 1 Inhaler 2  . aspirin EC 325 MG tablet Take 325 mg by mouth daily.    Marland Kitchen atorvastatin (LIPITOR) 40 MG tablet Take 1 tablet (40 mg total) by mouth daily. 90 tablet 1  . azelastine (ASTELIN) 0.1 % nasal spray Place 2 sprays into both nostrils at bedtime as needed for rhinitis. Use in each nostril as directed 30 mL 3  . baclofen (LIORESAL) 10 MG tablet Take 0.5 tablets (5 mg total) by mouth 2 (two) times daily. 30 tablet 4  . Cholecalciferol (VITAMIN D) 2000 units CAPS Take 1 capsule by mouth daily.       . diclofenac sodium (VOLTAREN) 1 % GEL Apply 1 application topically 2 (two) times daily as needed (pain). For pain    . doxycycline (VIBRAMYCIN) 50 MG capsule Take 1 capsule (50 mg total) by mouth 2 (two) times daily. 24 capsule 0  . furosemide (LASIX) 20 MG tablet Take 1 tablet (20 mg total) by mouth daily. 90 tablet 3  . glucagon (GLUCAGEN) 1 MG SOLR injection Inject 1 mg into the vein once as needed for low blood sugar. Reported on 11/16/2015    . insulin aspart protamine- aspart (NOVOLOG MIX 70/30) (70-30) 100 UNIT/ML injection Inject 0.05 mLs (5 Units total) into the skin 2 (two) times daily with a meal. 10 mL  0  . lisinopril (PRINIVIL,ZESTRIL) 10 MG tablet TAKE 1 TABLET BY MOUTH EVERY DAY 30 tablet 5  . Multiple Vitamins-Minerals (MULTIVITAMIN WITH MINERALS) tablet Take 1 tablet by mouth daily.    . pantoprazole (PROTONIX) 40 MG tablet Take 1 tablet (40 mg total) by mouth daily with lunch. 90 tablet 1  . pregabalin (LYRICA) 150 MG capsule Take 1 capsule (150 mg total) by mouth 2 (two) times daily. 60 capsule 5  . traMADol (ULTRAM) 50 MG tablet Take 1 tablet (50 mg total) by mouth every 6 (six) hours as needed. 90 tablet 4   No facility-administered medications prior to visit.      Allergies:   Bee venom; Beta adrenergic blockers; Carvedilol; and Influenza vaccines   Social History   Social History  . Marital status: Married    Spouse name: N/A  . Number of children: 2  . Years of education: N/A   Occupational History  . disable Retired   Social History Main Topics  . Smoking status: Former Smoker    Years: 50.00    Quit date: 07/22/2012  . Smokeless tobacco: Current User     Comment: pt states that he is using E-cigs  . Alcohol use No  . Drug use: No  . Sexual activity: No     Comment: electronic cigarettes no nicotene   Other Topics Concern  . None   Social History Narrative   Lives w/ wife     Family History:  The patient's family history includes COPD in his  father; Diabetes in his brother and daughter; Heart attack in his brother; Heart attack (age of onset: 54) in his mother; Heart disease in his brother and mother; Hyperlipidemia in his mother; Hypertension in his brother and mother; Peripheral vascular disease in his father; Stroke in his mother.   ROS:   Please see the history of present illness.    ROS All other systems reviewed and are negative.   PHYSICAL EXAM:   VS:  BP 124/62   Pulse 63    GEN: in no acute distress, in an electric wheelchair, with difficulties to move HEENT: normal  Neck: no JVD, carotid bruits, or masses Cardiac: RRR; no murmurs, rubs, or gallops,no edema, AKA ampuation B/L Respiratory:  clear to auscultation bilaterally, normal work of breathing GI: soft, nontender, nondistended, + BS Skin: warm and dry, no rash Neuro:  Alert and Oriented x 3, Strength and sensation are intact Psych: euthymic mood, full affect  Wt Readings from Last 3 Encounters:  07/08/16 146 lb (66.2 kg)  07/06/16 146 lb 12.8 oz (66.6 kg)  08/28/15 140 lb (63.5 kg)      Studies/Labs Reviewed:   EKG:  EKG is ordered today.  The ekg ordered today demonstrates SB, LBBB, unchanged from prior ECG  Recent Labs: 07/01/2016: ALT 19; B Natriuretic Peptide 579.1; TSH 0.233 07/05/2016: Magnesium 2.1 07/08/2016: BUN 12; Creatinine, Ser 0.75; Hemoglobin 12.5; Platelets 253; Potassium 4.1; Sodium 138   Lipid Panel    Component Value Date/Time   CHOL 92 04/10/2016 1133   TRIG 45.0 04/10/2016 1133   HDL 30.40 (L) 04/10/2016 1133   CHOLHDL 3 04/10/2016 1133   VLDL 9.0 04/10/2016 1133   LDLCALC 53 04/10/2016 1133   LDLDIRECT 123.5 03/17/2008 1049    Additional studies/ records that were reviewed today include:   08/14/2015 Left ventricle: The cavity size was normal. Systolic function was  moderately reduced. The estimated ejection fraction was 35%.  Diffuse hypokinesis worse in the  inferior and inferolateral  regions. Doppler  parameters are consistent with abnormal left  ventricular relaxation (grade 1 diastolic dysfunction). - Mitral valve: There was mild regurgitation. - Right ventricle: The cavity size was normal. Wall thickness was  normal. Systolic function was mildly reduced. - Tricuspid valve: There was mild regurgitation. - Inferior vena cava: The vessel was normal in size. The  respirophasic diameter changes were in the normal range (>= 50%),  consistent with normal central venous pressure.    ASSESSMENT:    1. Coronary artery disease involving native coronary artery of native heart without angina pectoris   2. Essential hypertension   3. Cardiomyopathy, ischemic   4. Bradycardia   5. Dizziness      PLAN:  In order of problems listed above:  1. CAD - asymptomatic, his ECG today shows new inferior and anterolateral, however the last two hospital visits those were not present, ? [place], we will continue the same medical management, ASA, atorvastatin, lisinopril. Repeat ECG at the next visit.   2. Ischemic CMP, lVEF stable at 35%, no palpitations or syncope,  no BB as has recurrent bradycardia, continue lisinopril.  3. Cough - advised to use OTC mucolytics and expectorants..   4.  Chronic systolic CHF - no evidence of fluid overload   5. Hypertension - controlled.   Medication Adjustments/Labs and Tests Ordered: Current medicines are reviewed at length with the patient today.  Concerns regarding medicines are outlined above.  Medication changes, Labs and Tests ordered today are listed in the Patient Instructions below. Patient Instructions  Medication Instructions:   Your physician recommends that you continue on your current medications as directed. Please refer to the Current Medication list given to you today.    Follow-Up:  3 MONTHS WITH DR Meda Coffee       If you need a refill on your cardiac medications before your next appointment, please call your pharmacy.       Signed, [name], MD  07/15/2016 9:07 PM    [address], [place], [place]  [address] Phone: [phone]; Fax: [phone]

## 2016-08-29 ENCOUNTER — Ambulatory Visit: Payer: Medicare Other | Admitting: Cardiology

## 2016-10-03 ENCOUNTER — Encounter: Payer: Self-pay | Admitting: Cardiology

## 2016-10-09 ENCOUNTER — Ambulatory Visit: Payer: Medicare Other | Admitting: Internal Medicine

## 2016-10-15 ENCOUNTER — Encounter: Payer: Self-pay | Admitting: Cardiology

## 2016-10-15 ENCOUNTER — Ambulatory Visit (INDEPENDENT_AMBULATORY_CARE_PROVIDER_SITE_OTHER): Payer: Medicare Other | Admitting: Cardiology

## 2016-10-15 VITALS — BP 108/58 | HR 55

## 2016-10-15 DIAGNOSIS — I255 Ischemic cardiomyopathy: Secondary | ICD-10-CM | POA: Diagnosis not present

## 2016-10-15 DIAGNOSIS — I5042 Chronic combined systolic (congestive) and diastolic (congestive) heart failure: Secondary | ICD-10-CM

## 2016-10-15 DIAGNOSIS — I739 Peripheral vascular disease, unspecified: Secondary | ICD-10-CM | POA: Diagnosis not present

## 2016-10-15 DIAGNOSIS — I2510 Atherosclerotic heart disease of native coronary artery without angina pectoris: Secondary | ICD-10-CM

## 2016-10-15 DIAGNOSIS — I10 Essential (primary) hypertension: Secondary | ICD-10-CM

## 2016-10-15 NOTE — Progress Notes (Signed)
Cardiology Office Note    Date:  10/15/2016   ID:  Jaime [name], DOB 1941/06/21, MRN [id number]  PCP:  [name], MD  Cardiologist:  Dr Ron Parker -->  [name], MD   No chief complaint on file.  History of Present Illness:  Jaime [name] is a 76 y.o. male with h/o MS, s/p B/L AKA, wheelchair bound, incontinence, CAD, s/p PCI to LCX in 2007, with h/o ischemic CMP, the last LVEF 35% on echo in 07/2015, mildly reduced RVEF. H/O PAD.  He used to see Dr Ron Parker, the last visit in 2015.  07/11/2016 - The patient is coming after 6 months, he is in a great mood and states that he feels great. He has been admitted on 70/26/37 with UTI complicated by septic shock requiring Levophed.  He Went to the ER again on 07/08/16 with dizziness, fatigue and was found to be bradycardic on BB that were held. His symptoms have improved since then. He has hard time to cough out the phlem, no fever/ chills. Denies chest pain or DOE, no palpitations or syncope. He sleeps upright in a hospital bed and has no PND. No palpitations or syncope.  10/15/2016 - the patient is coming after 3 months, in the interim he states he has been feeling well. Denies any chest pain, shortness of breath, no palpitations or dizziness. He only gets out of bed for a short time every day he and doesn't like to leave the house. He appears to be in good spirits. He has had upper respiratory infection with productive cough however no fever or chills. He feels like the phlegm is stuck in his throat and he can't cough it out.  Past Medical History:  Diagnosis Date  . Atherosclerotic PVD with ulceration ([employer])    left foot  . CAD (coronary artery disease) CARDIOLOGIST- DR KATZ-- VISIT 06-05-2011 IN [facility]   non-STEMI, 2007.Marland Kitchenoccluded circumflex.. Taxus stent placed...residual 80% LAD...50% RCA  . Cardiomyopathy, ischemic    EF 45% per ECHO 2008  //   EF 25%, echo, August, 2013 //  Echo (8/15):  Mild LVH, EF 30-35%, ant-lat and lat AK,  inf HK, Gr 1 DD, mild MR, mild LAE  . Carotid artery disease ([place])    a.  Doppler, February, 2012, 0-39% bilateral,Mild smooth plaque;  b.  Carotid US (8/15):  Bilateral 1-39% ICA >>> F/u 2 years  . Chronic indwelling Foley catheter   . Constipation   . Dementia   . Diabetes mellitus    INSULIN-DEPENDent  . Fatigue SEVERE  . H/O pleural effusion 2008   POST THORACENTESIS  . History of colon polyps PRECANCEROUS  . Hyperlipidemia   . Hypertension   . Impotence   . Increased prostate specific antigen (PSA) velocity   . Insomnia   . Lung nodule    resolved 11-2006 CT Chest  . Multiple sclerosis ([place]) [place] -- LAST VISIT 11-20-2010  NOTE W/ CHART  . PAC (premature atrial contraction)    December, 2013  . Pulmonary hypertension    moderate ECHO Jan 2008  . Scoliosis associated with other condition   . Sleep apnea   . Systolic heart failure   . TIA (transient ischemic attack)   . Tobacco abuse    quit   . Urinary retention    dx ~ 2-12, like from [place], now with a catheter, saw urology  . Urinary tract infection    hx of  Past Surgical History:  Procedure Laterality Date  . ABDOMINAL ANGIOGRAM  12/17/2011   Procedure: ABDOMINAL ANGIOGRAM;  Surgeon: Serafina Mitchell, MD;  Location: [facility] CATH LAB;  Service: Cardiovascular;;  . ABDOMINAL AORTAGRAM N/A 08/19/2013   Procedure: ABDOMINAL Maxcine Ham;  Surgeon: Conrad [place], MD;  Location: [facility] CATH LAB;  Service: Cardiovascular;  Laterality: N/A;  . AMPUTATION  03/17/2012   Procedure: AMPUTATION ABOVE KNEE;  Surgeon: Conrad Swansea, MD;  Location: [place];  Service: Vascular;  Laterality: Left;  . AMPUTATION  06/03/2012   Procedure: AMPUTATION ABOVE KNEE;  Surgeon: Conrad Vicco, MD;  Location: [place];  Service: Vascular;  Laterality: Right;  . CHOLECYSTECTOMY N/A 10/25/2014   Procedure: LAPAROSCOPIC CHOLECYSTECTOMY ;  Surgeon: Coralie Keens, MD;  Location: [place];  Service: General;  Laterality: N/A;  . CORONARY  ANGIOPLASTY WITH STENT PLACEMENT  05-01-2006   OCCLUDED CIRCUMFLEX -- TAXUS STENT PLACMENT  AND RESIDUAL 80% LAD,  50% RCA  . CYSTOSCOPY  07/30/2011   Procedure: CYSTOSCOPY;  Surgeon: Hanley Ben, MD;  Location: [facility];  Service: Urology;  Laterality: N/A;  . ERCP N/A 08/25/2014   Procedure: ENDOSCOPIC RETROGRADE CHOLANGIOPANCREATOGRAPHY (ERCP);  Surgeon: Ladene Artist, MD;  Location: [facility] ENDOSCOPY;  Service: Endoscopy;  Laterality: N/A;  . FEMORAL-POPLITEAL BYPASS GRAFT  03/11/2012   Procedure: BYPASS GRAFT FEMORAL-POPLITEAL ARTERY;  Surgeon: Conrad Carson, MD;  Location: [place];  Service: Vascular;  Laterality: Left;  embolectomy left lower leg  . FEMORAL-TIBIAL BYPASS GRAFT  03/11/2012   Procedure: BYPASS GRAFT FEMORAL-TIBIAL ARTERY;  Surgeon: Conrad [place], MD;  Location: [facility] OR;  Service: Vascular;  Laterality: Left;  Left Femoral -Tibial trunk bypass, Endarterectomy of Tibial- Peroneal trunk with vein angioplasty.  Marland Kitchen HERNIA REPAIR  1990   (R)  . INTRAOPERATIVE ARTERIOGRAM  03/11/2012   Procedure: INTRA OPERATIVE ARTERIOGRAM;  Surgeon: Conrad Yamhill, MD;  Location: [place];  Service: Vascular;  Laterality: Left;  . LOWER EXTREMITY ANGIOGRAM Bilateral 12/17/2011   Procedure: LOWER EXTREMITY ANGIOGRAM;  Surgeon: Serafina Mitchell, MD;  Location: [facility] CATH LAB;  Service: Cardiovascular;  Laterality: Bilateral;  bil lower extrem angio  . THORACENTESIS  2008   PLEURAL EFFUSION  . TRANSURETHRAL RESECTION OF PROSTATE  07/30/2011   Procedure: TRANSURETHRAL RESECTION OF THE PROSTATE (TURP);  Surgeon: Hanley Ben, MD;  Location: [facility];  Service: Urology;  Laterality: N/A;    Current Medications: Outpatient Medications Prior to Visit  Medication Sig Dispense Refill  . albuterol (PROVENTIL HFA;VENTOLIN HFA) 108 (90 Base) MCG/ACT inhaler Inhale 2 puffs into the lungs every 6 (six) hours as needed for wheezing or shortness of breath. 1 Inhaler 2  . aspirin EC 325 MG  tablet Take 325 mg by mouth daily.    Marland Kitchen atorvastatin (LIPITOR) 40 MG tablet Take 1 tablet (40 mg total) by mouth daily. 90 tablet 1  . azelastine (ASTELIN) 0.1 % nasal spray Place 2 sprays into both nostrils at bedtime as needed for rhinitis. Use in each nostril as directed 30 mL 3  . baclofen (LIORESAL) 10 MG tablet Take 0.5 tablets (5 mg total) by mouth 2 (two) times daily. 30 tablet 4  . Cholecalciferol (VITAMIN D) 2000 units CAPS Take 1 capsule by mouth daily.     . diclofenac sodium (VOLTAREN) 1 % GEL Apply 1 application topically 2 (two) times daily as needed (pain). For pain    . glucagon (GLUCAGEN) 1 MG SOLR injection Inject 1 mg into the  vein once as needed for low blood sugar. Reported on 11/16/2015    . insulin aspart protamine- aspart (NOVOLOG MIX 70/30) (70-30) 100 UNIT/ML injection Inject 0.05 mLs (5 Units total) into the skin 2 (two) times daily with a meal. 10 mL 0  . lisinopril (PRINIVIL,ZESTRIL) 10 MG tablet TAKE 1 TABLET BY MOUTH EVERY DAY 30 tablet 5  . Multiple Vitamins-Minerals (MULTIVITAMIN WITH MINERALS) tablet Take 1 tablet by mouth daily.    . pantoprazole (PROTONIX) 40 MG tablet Take 1 tablet (40 mg total) by mouth daily with lunch. 90 tablet 1  . pregabalin (LYRICA) 150 MG capsule Take 1 capsule (150 mg total) by mouth 2 (two) times daily. 60 capsule 5  . traMADol (ULTRAM) 50 MG tablet Take 1 tablet (50 mg total) by mouth every 6 (six) hours as needed. 90 tablet 4  . furosemide (LASIX) 20 MG tablet Take 1 tablet (20 mg total) by mouth daily. 90 tablet 3   No facility-administered medications prior to visit.      Allergies:   Bee venom; Beta adrenergic blockers; Carvedilol; and Influenza vaccines   Social History   Social History  . Marital status: Married    Spouse name: N/A  . Number of children: 2  . Years of education: N/A   Occupational History  . disable Retired   Social History Main Topics  . Smoking status: Former Smoker    Years: 50.00    Quit  date: 07/22/2012  . Smokeless tobacco: Current User     Comment: pt states that he is using E-cigs  . Alcohol use No  . Drug use: No  . Sexual activity: No     Comment: electronic cigarettes no nicotene   Other Topics Concern  . None   Social History Narrative   Lives w/ wife     Family History:  The patient's family history includes COPD in his father; Diabetes in his brother and daughter; Heart attack in his brother; Heart attack (age of onset: 36) in his mother; Heart disease in his brother and mother; Hyperlipidemia in his mother; Hypertension in his brother and mother; Peripheral vascular disease in his father; Stroke in his mother.   ROS:   Please see the history of present illness.    ROS All other systems reviewed and are negative.   PHYSICAL EXAM:   VS:  BP (!) 108/58   Pulse (!) 55   SpO2 95%    GEN: in no acute distress, in an electric wheelchair, with difficulties to move HEENT: normal  Neck: no JVD, carotid bruits, or masses Cardiac: RRR; no murmurs, rubs, or gallops,no edema, AKA ampuation B/L Respiratory:  clear to auscultation bilaterally, normal work of breathing, no wheezing. GI: soft, nontender, nondistended, + BS Skin: warm and dry, no rash Neuro:  Alert and Oriented x 3, Strength and sensation are intact Psych: euthymic mood, full affect  Wt Readings from Last 3 Encounters:  07/08/16 146 lb (66.2 kg)  07/06/16 146 lb 12.8 oz (66.6 kg)  08/28/15 140 lb (63.5 kg)      Studies/Labs Reviewed:   EKG:  EKG is ordered today.  The ekg ordered today demonstrates SB, LBBB, unchanged from prior ECG  Recent Labs: 07/01/2016: ALT 19; B Natriuretic Peptide 579.1; TSH 0.233 07/05/2016: Magnesium 2.1 07/08/2016: BUN 12; Creatinine, Ser 0.75; Hemoglobin 12.5; Platelets 253; Potassium 4.1; Sodium 138   Lipid Panel    Component Value Date/Time   CHOL 92 04/10/2016 1133   TRIG 45.0 04/10/2016  1133   HDL 30.40 (L) 04/10/2016 1133   CHOLHDL 3 04/10/2016 1133    VLDL 9.0 04/10/2016 1133   LDLCALC 53 04/10/2016 1133   LDLDIRECT 123.5 03/17/2008 1049    Additional studies/ records that were reviewed today include:   08/14/2015 Left ventricle: The cavity size was normal. Systolic function was  moderately reduced. The estimated ejection fraction was 35%.  Diffuse hypokinesis worse in the inferior and inferolateral  regions. Doppler parameters are consistent with abnormal left  ventricular relaxation (grade 1 diastolic dysfunction). - Mitral valve: There was mild regurgitation. - Right ventricle: The cavity size was normal. Wall thickness was  normal. Systolic function was mildly reduced. - Tricuspid valve: There was mild regurgitation. - Inferior vena cava: The vessel was normal in size. The  respirophasic diameter changes were in the normal range (>= 50%),  consistent with normal central venous pressure.    ASSESSMENT:    No diagnosis found.   PLAN:  In order of problems listed above:  1. CAD - asymptomatic, No EKG today, however patient asymptomatic, will continue medical management for CAD, no ischemic workup is needed right now.  2. Ischemic CMP, lVEF stable at 35%, no palpitations or syncope,  no BB as has recurrent bradycardia, continue lisinopril.  3. Cough - advised to use OTC mucolytics and expectorants. No fever/chills.  4.  Chronic systolic CHF - no evidence of fluid overload   5. Hypertension - controlled.   Medication Adjustments/Labs and Tests Ordered: Current medicines are reviewed at length with the patient today.  Concerns regarding medicines are outlined above.  Medication changes, Labs and Tests ordered today are listed in the Patient Instructions below. Patient Instructions  Medication Instructions:   Your physician recommends that you continue on your current medications as directed. Please refer to the Current Medication list given to you today.     Follow-Up:  Your physician wants you to  follow-up in: [place] will receive a reminder letter in the mail two months in advance. If you don't receive a letter, please call our office to schedule the follow-up appointment.        If you need a refill on your cardiac medications before your next appointment, please call your pharmacy.      Signed, [name], MD  10/15/2016 10:53 AM    [address], [place], [place]  [address] Phone: [phone]; Fax: [phone]

## 2016-10-15 NOTE — Patient Instructions (Signed)

## 2016-10-29 ENCOUNTER — Ambulatory Visit (INDEPENDENT_AMBULATORY_CARE_PROVIDER_SITE_OTHER): Payer: Medicare Other | Admitting: Internal Medicine

## 2016-10-29 ENCOUNTER — Encounter: Payer: Self-pay | Admitting: Internal Medicine

## 2016-10-29 ENCOUNTER — Other Ambulatory Visit (INDEPENDENT_AMBULATORY_CARE_PROVIDER_SITE_OTHER): Payer: Medicare Other

## 2016-10-29 VITALS — BP 110/62 | HR 57 | Temp 97.9°F | Resp 16 | Wt 160.0 lb

## 2016-10-29 DIAGNOSIS — I10 Essential (primary) hypertension: Secondary | ICD-10-CM

## 2016-10-29 DIAGNOSIS — Z794 Long term (current) use of insulin: Secondary | ICD-10-CM

## 2016-10-29 DIAGNOSIS — E1159 Type 2 diabetes mellitus with other circulatory complications: Secondary | ICD-10-CM | POA: Diagnosis not present

## 2016-10-29 DIAGNOSIS — I2510 Atherosclerotic heart disease of native coronary artery without angina pectoris: Secondary | ICD-10-CM | POA: Diagnosis not present

## 2016-10-29 DIAGNOSIS — G546 Phantom limb syndrome with pain: Secondary | ICD-10-CM | POA: Diagnosis not present

## 2016-10-29 DIAGNOSIS — I255 Ischemic cardiomyopathy: Secondary | ICD-10-CM

## 2016-10-29 DIAGNOSIS — K219 Gastro-esophageal reflux disease without esophagitis: Secondary | ICD-10-CM

## 2016-10-29 LAB — COMPREHENSIVE METABOLIC PANEL
ALT: 11 U/L (ref 0–53)
AST: 12 U/L (ref 0–37)
Albumin: 3.5 g/dL (ref 3.5–5.2)
Alkaline Phosphatase: 44 U/L (ref 39–117)
BUN: 16 mg/dL (ref 6–23)
CALCIUM: 9 mg/dL (ref 8.4–10.5)
CHLORIDE: 105 meq/L (ref 96–112)
CO2: 28 meq/L (ref 19–32)
CREATININE: 0.65 mg/dL (ref 0.40–1.50)
GFR: 154.16 mL/min (ref >60.00)
Glucose, Bld: 138 mg/dL — ABNORMAL HIGH (ref 70–99)
Potassium: 4.1 mEq/L (ref 3.5–5.1)
Sodium: 138 mEq/L (ref 135–145)
Total Bilirubin: 0.6 mg/dL (ref 0.2–1.2)
Total Protein: 6.6 g/dL (ref 6.0–8.3)

## 2016-10-29 LAB — CBC WITH DIFFERENTIAL/PLATELET
BASOS PCT: 0.4 % (ref 0.0–3.0)
Basophils Absolute: 0 10*3/uL (ref 0.0–0.1)
EOS PCT: 3.2 % (ref 0.0–5.0)
Eosinophils Absolute: 0.2 10*3/uL (ref 0.0–0.7)
HEMATOCRIT: 39.9 % (ref 39.0–52.0)
HEMOGLOBIN: 13 g/dL (ref 13.0–17.0)
LYMPHS PCT: 31 % (ref 12.0–46.0)
Lymphs Abs: 1.6 10*3/uL (ref 0.7–4.0)
MCHC: 32.6 g/dL (ref 30.0–36.0)
MCV: 81.5 fl (ref 78.0–100.0)
MONO ABS: 0.5 10*3/uL (ref 0.1–1.0)
Monocytes Relative: 9 % (ref 3.0–12.0)
Neutro Abs: 2.9 10*3/uL (ref 1.4–7.7)
Neutrophils Relative %: 56.4 % (ref 43.0–77.0)
Platelets: 166 10*3/uL (ref 150.0–400.0)
RBC: 4.9 Mil/uL (ref 4.22–5.81)
RDW: 17.6 % — AB (ref 11.5–15.5)
WBC: 5.2 10*3/uL (ref 4.0–10.5)

## 2016-10-29 LAB — HEMOGLOBIN A1C: Hgb A1c MFr Bld: 6.7 % — ABNORMAL HIGH (ref 4.6–6.5)

## 2016-10-29 MED ORDER — PANTOPRAZOLE SODIUM 40 MG PO TBEC: 40.0000 mg | DELAYED_RELEASE_TABLET | Freq: Every day | ORAL | 1 refills | Status: DC

## 2016-10-29 MED ORDER — INSULIN ASPART PROT & ASPART (70-30 MIX) 100 UNIT/ML ~~LOC~~ SUSP: 5.0000 [IU] | Freq: Two times a day (BID) | SUBCUTANEOUS | 11 refills | Status: DC

## 2016-10-29 NOTE — Progress Notes (Signed)
Subjective:    Patient ID: Jaime [name], male    DOB: 1941/02/20, 76 y.o.   MRN: [id number]  HPI The patient is here for follow up.  CAD, chronic systolic heart failure, Hypertension: He is taking his medication daily. He is compliant with a low sodium diet.  He denies chest pain, palpitations, edema, shortness of breath and regular headaches. He is not exercising regularly.      Diabetes: He is taking his medication daily as prescribed. He is compliant with a diabetic diet.   He monitors his sugars and they have been running 90-150's.  He is not up-to-date with an ophthalmology examination.   GERD:  He is taking his medication daily as prescribed.  He denies any GERD symptoms and feels his GERD is well controlled.   s/p b/l amputations with phantom pain:  He is taking lyrica twice daily and tramadol as needed.   He is unable to transfer out of bed and needs total assistance.  They have a manual hoyer lift at home, but this is too difficult for just his wife to use - there has to be someone else there.  In an emergency she would not be able to get him out of bed.    Has PND - advised to use mucinex. He is not taking any allergy medication.    Medications and allergies reviewed with patient and updated if appropriate.  Patient Active Problem List   Diagnosis Date Noted  . Shock ([place]) 07/01/2016  . Pain   . Altered mental status   . GERD (gastroesophageal reflux disease) 04/10/2016  . Coronary artery disease involving native coronary artery of native heart without angina pectoris 12/28/2015  . PAD (peripheral artery disease) ([place]) 12/28/2015  . Shortness of breath 12/28/2015  . Bradycardia 08/14/2015  . Chronic combined systolic and diastolic CHF (congestive heart failure) ([place]) 08/14/2015  . Bronchitis 08/13/2015  . Phantom limb pain ([place]) 12/08/2014  . Syncope 11/01/2014  . Coronary arteriosclerosis   . SOB (shortness of breath)   . Sepsis ([place]) 08/18/2014  . UTI (lower  urinary tract infection) 08/18/2014  . 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 Paraparesis of both lower limbs ([place]) 05/31/2014  . Chronic systolic CHF (congestive heart failure) ([place]) 05/30/2014  . Scoliosis, thoracogenic, acquired 03/15/2014  . Mild cognitive impairment 02/11/2014  . PVD (peripheral vascular disease) ([place]) 08/13/2013  . Pain in limb 08/13/2013  . S/P AKA (above knee amputation) bilateral ([place]) 08/03/2012  . Cough 07/21/2012  . PAC (premature atrial contraction)   . Peripheral vascular disease, unspecified 07/03/2012  . 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 Atherosclerosis of native arteries of the extremities with ulceration(440.23) 04/17/2012  . Ejection fraction   . Cardiomyopathy, ischemic   . Pulmonary hypertension   . Urinary retention   . Hemiparesis ([place])   . Essential hypertension   . Urinary tract infection after immobility, UCX MRSA 03/26/2012  . Atherosclerotic PVD with ulceration ([place]) 03/06/2012  . CAD (coronary artery disease)   . Carotid artery disease ([place])   . SHOULDER PAIN 08/24/2010  . HYPERLIPIDEMIA 07/31/2007  . BPH (benign prostatic hyperplasia) 04/15/2007  . DMII (diabetes mellitus, type 2) ([place]) 12/18/2006    Current Outpatient Prescriptions on File Prior to Visit  Medication Sig Dispense Refill  . albuterol (PROVENTIL HFA;VENTOLIN HFA) 108 (90 Base) MCG/ACT inhaler Inhale 2 puffs into the lungs every 6 (six) hours as needed for wheezing or shortness of breath. 1  Inhaler 2  . aspirin EC 325 MG tablet Take 325 mg by mouth daily.    Marland Kitchen atorvastatin (LIPITOR) 40 MG tablet Take 1 tablet (40 mg total) by mouth daily. 90 tablet 1  . azelastine (ASTELIN) 0.1 % nasal spray Place 2 sprays into both nostrils at bedtime as needed for rhinitis. Use in each nostril as directed 30 mL 3  . baclofen (LIORESAL) 10 MG tablet Take 0.5 tablets (5 mg total) by mouth 2 (two) times daily. 30 tablet 4  .  Cholecalciferol (VITAMIN D) 2000 units CAPS Take 1 capsule by mouth daily.     . diclofenac sodium (VOLTAREN) 1 % GEL Apply 1 application topically 2 (two) times daily as needed (pain). For pain    . glucagon (GLUCAGEN) 1 MG SOLR injection Inject 1 mg into the vein once as needed for low blood sugar. Reported on 11/16/2015    . insulin aspart protamine- aspart (NOVOLOG MIX 70/30) (70-30) 100 UNIT/ML injection Inject 0.05 mLs (5 Units total) into the skin 2 (two) times daily with a meal. 10 mL 0  . lisinopril (PRINIVIL,ZESTRIL) 10 MG tablet TAKE 1 TABLET BY MOUTH EVERY DAY 30 tablet 5  . Multiple Vitamins-Minerals (MULTIVITAMIN WITH MINERALS) tablet Take 1 tablet by mouth daily.    . pregabalin (LYRICA) 150 MG capsule Take 1 capsule (150 mg total) by mouth 2 (two) times daily. 60 capsule 5  . traMADol (ULTRAM) 50 MG tablet Take 1 tablet (50 mg total) by mouth every 6 (six) hours as needed. 90 tablet 4  . furosemide (LASIX) 20 MG tablet Take 1 tablet (20 mg total) by mouth daily. 90 tablet 3   No current facility-administered medications on file prior to visit.     Past Medical History:  Diagnosis Date  . Atherosclerotic PVD with ulceration ([place])    left foot  . CAD (coronary artery disease) CARDIOLOGIST- DR KATZ-- VISIT 06-05-2011 IN [facility]   non-STEMI, 2007.Marland Kitchenoccluded circumflex.. Taxus stent placed...residual 80% LAD...50% RCA  . Cardiomyopathy, ischemic    EF 45% per ECHO 2008  //   EF 25%, echo, August, 2013 //  Echo (8/15):  Mild LVH, EF 30-35%, ant-lat and lat AK, inf HK, Gr 1 DD, mild MR, mild LAE  . Carotid artery disease ([place])    a.  Doppler, February, 2012, 0-39% bilateral,Mild smooth plaque;  b.  Carotid US (8/15):  Bilateral 1-39% ICA >>> F/u 2 years  . Chronic indwelling Foley catheter   . Constipation   . Dementia   . Diabetes mellitus    INSULIN-DEPENDent  . Fatigue SEVERE  . H/O pleural effusion 2008   POST THORACENTESIS  . History of colon polyps PRECANCEROUS  .  Hyperlipidemia   . Hypertension   . Impotence   . Increased prostate specific antigen (PSA) velocity   . Insomnia   . Lung nodule    resolved 11-2006 CT Chest  . Multiple sclerosis ([place]) [place] -- LAST VISIT 11-20-2010  NOTE W/ CHART  . PAC (premature atrial contraction)    December, 2013  . Pulmonary hypertension    moderate ECHO Jan 2008  . Scoliosis associated with other condition   . Sleep apnea   . Systolic heart failure   . TIA (transient ischemic attack)   . Tobacco abuse    quit   . Urinary retention    dx ~ 2-12, like from [place], now with a catheter, saw urology  . Urinary tract infection  hx of    Past Surgical History:  Procedure Laterality Date  . ABDOMINAL ANGIOGRAM  12/17/2011   Procedure: ABDOMINAL ANGIOGRAM;  Surgeon: Serafina Mitchell, MD;  Location: [facility] CATH LAB;  Service: Cardiovascular;;  . ABDOMINAL AORTAGRAM N/A 08/19/2013   Procedure: ABDOMINAL Maxcine Ham;  Surgeon: Conrad Bl[place], MD;  Location: [facility] CATH LAB;  Service: Cardiovascular;  Laterality: N/A;  . AMPUTATION  03/17/2012   Procedure: AMPUTATION ABOVE KNEE;  Surgeon: Conrad [place], MD;  Location: [place];  Service: Vascular;  Laterality: Left;  . AMPUTATION  06/03/2012   Procedure: AMPUTATION ABOVE KNEE;  Surgeon: Conrad L[place], MD;  Location: [facility];  Service: Vascular;  Laterality: Right;  . CHOLECYSTECTOMY N/A 10/25/2014   Procedure: LAPAROSCOPIC CHOLECYSTECTOMY ;  Surgeon: Coralie Keens, MD;  Location: [place];  Service: General;  Laterality: N/A;  . CORONARY ANGIOPLASTY WITH STENT PLACEMENT  05-01-2006   OCCLUDED CIRCUMFLEX -- TAXUS STENT PLACMENT  AND RESIDUAL 80% LAD,  50% RCA  . CYSTOSCOPY  07/30/2011   Procedure: CYSTOSCOPY;  Surgeon: Hanley Ben, MD;  [facility];  Service: Urology;  Laterality: N/A;  . ERCP N/A 08/25/2014   Procedure: ENDOSCOPIC RETROGRADE CHOLANGIOPANCREATOGRAPHY (ERCP);  Surgeon: Ladene Artist, MD;  Location: [facility] ENDOSCOPY;   Service: Endoscopy;  Laterality: N/A;  . FEMORAL-POPLITEAL BYPASS GRAFT  03/11/2012   Procedure: BYPASS GRAFT FEMORAL-POPLITEAL ARTERY;  Surgeon: Conrad [place], MD;  Location: [facility];  Service: Vascular;  Laterality: Left;  embolectomy left lower leg  . FEMORAL-TIBIAL BYPASS GRAFT  03/11/2012   Procedure: BYPASS GRAFT FEMORAL-TIBIAL ARTERY;  Surgeon: Conrad [place]omas, MD;  Location[facility] OR;  Service: Vascular;  Laterality: Left;  Left Femoral -Tibial trunk bypass, Endarterectomy of Tibial- Peroneal trunk with vein angioplasty.  Marland Kitchen HERNIA REPAIR  1990   (R)  . INTRAOPERATIVE ARTERIOGRAM  03/11/2012   Procedure: INTRA OPERATIVE ARTERIOGRAM;  Surgeon: Conrad [place], MD;  Location: [facility];  Service: Vascular;  Laterality: Left;  . LOWER EXTREMITY ANGIOGRAM Bilateral 12/17/2011   Procedure: LOWER EXTREMITY ANGIOGRAM;  Surgeon: Serafina Mitchell, MD;  Location: [facility] CATH LAB;  Service: Cardiovascular;  Laterality: Bilateral;  bil lower extrem angio  . THORACENTESIS  2008   PLEURAL EFFUSION  . TRANSURETHRAL RESECTION OF PROSTATE  07/30/2011   Procedure: TRANSURETHRAL RESECTION OF THE PROSTATE (TURP);  Surgeon: Hanley Ben, MD;  Location: [facility];  Service: Urology;  Laterality: N/A;    Social History   Social History  . Marital status: Married    Spouse name: N/A  . Number of children: 2  . Years of education: N/A   Occupational History  . disable Retired   Social History Main Topics  . Smoking status: Former Smoker    Years: 50.00    Quit date: 07/22/2012  . Smokeless tobacco: Current User     Comment: pt states that he is using E-cigs  . Alcohol use No  . Drug use: No  . Sexual activity: No     Comment: electronic cigarettes no nicotene   Other Topics Concern  . Not on file   Social History Narrative   Lives w/ wife    Family History  Problem Relation Age of Onset  . Heart attack Mother 39  . Heart disease Mother   . Stroke Mother   . Hyperlipidemia Mother   .  Hypertension Mother   . COPD Father   . Peripheral vascular disease Father   . Diabetes Brother   .  Heart disease Brother   . Hypertension Brother   . Heart attack Brother   . Diabetes Daughter   . Colon cancer Neg Hx   . Prostate cancer Neg Hx     Review of Systems  Constitutional: Negative for chills and fever.  HENT: Positive for postnasal drip.   Respiratory: Negative for cough, shortness of breath and wheezing.   Cardiovascular: Negative for chest pain, palpitations and leg swelling.  Gastrointestinal: Negative for abdominal pain, constipation and diarrhea.  Genitourinary: Negative for dysuria.  Neurological: Positive for headaches (occ). Negative for light-headedness.       Objective:   Vitals:   10/29/16 1357  BP: 110/62  Pulse: (!) 57  Resp: 16  Temp: 97.9 F (36.6 C)   Wt Readings from Last 3 Encounters:  10/29/16 160 lb (72.6 kg)  07/08/16 146 lb (66.2 kg)  07/06/16 146 lb 12.8 oz (66.6 kg)   Body mass index is 22.32 kg/m.   Physical Exam    Constitutional: Appears well-developed and well-nourished. No distress.  HENT:  Head: Normocephalic and atraumatic.  Neck: Neck supple. No tracheal deviation present. No thyromegaly present.  No cervical lymphadenopathy Cardiovascular: Normal rate, regular rhythm and normal heart sounds.   No murmur heard. No carotid bruit .  No edema Pulmonary/Chest: Effort normal and breath sounds normal. No respiratory distress. No has no wheezes. No rales.  Skin: Skin is warm and dry. Not diaphoretic.  Psychiatric: Normal mood and affect. Behavior is normal.      Assessment & Plan:    See Problem List for Assessment and Plan of chronic medical problems.

## 2016-10-29 NOTE — Patient Instructions (Addendum)
  Test(s) ordered today. Your results will be released to [place] (or called to you) after review, usually within 72hours after test completion. If any changes need to be made, you will be notified at that same time.   Medications reviewed and updated.  No changes recommended at this time.  Your prescription(s) have been submitted to your pharmacy. Please take as directed and contact our office if you believe you are having problem(s) with the medication(s).   Please followup in 6 motnhs

## 2016-10-29 NOTE — Assessment & Plan Note (Signed)
Lab Results  Component Value Date   HGBA1C 6.7 (H) 10/29/2016   Check a1c today Continue current dose of insulin - no low sugars at home Make eye appt

## 2016-10-29 NOTE — Assessment & Plan Note (Signed)
No concerning symptoms Following with cardiology Continue current medication

## 2016-10-29 NOTE — Progress Notes (Signed)
Pre visit review using our clinic review tool, if applicable. No additional management support is needed unless otherwise documented below in the visit note. 

## 2016-10-29 NOTE — Assessment & Plan Note (Signed)
GERD controlled Continue daily medication  

## 2016-10-29 NOTE — Assessment & Plan Note (Signed)
BP well controlled Current regimen effective and well tolerated Continue current medications at current doses  

## 2016-10-29 NOTE — Assessment & Plan Note (Signed)
Controlled, stable Continue current dose of medication  

## 2016-11-01 ENCOUNTER — Encounter: Payer: Self-pay | Admitting: Internal Medicine

## 2016-11-04 ENCOUNTER — Telehealth: Payer: Self-pay | Admitting: Neurology

## 2016-11-04 NOTE — Telephone Encounter (Signed)
Received a call from PT regarding medication: Topamax.  Patient needs a refill of medication. Yes  Patient having side effects from medication. No  Patient calling to update [place] on medication. PT states he is in a lot of pain and needs a refil CB# [phone]

## 2016-11-05 MED ORDER — TRAMADOL HCL 50 MG PO TABS: 50.0000 mg | ORAL_TABLET | Freq: Four times a day (QID) | ORAL | 3 refills | Status: DC | PRN

## 2016-11-05 NOTE — Telephone Encounter (Signed)
Spoke to spouse.  Advised refilled med, confirmed pharm on file.  Spouse verbalized understanding.

## 2016-11-21 ENCOUNTER — Other Ambulatory Visit: Payer: Self-pay | Admitting: Internal Medicine

## 2016-11-27 DIAGNOSIS — N400 Enlarged prostate without lower urinary tract symptoms: Secondary | ICD-10-CM | POA: Diagnosis not present

## 2016-12-04 ENCOUNTER — Ambulatory Visit: Payer: Medicare Other | Admitting: Neurology

## 2016-12-25 ENCOUNTER — Telehealth: Payer: Self-pay | Admitting: Neurology

## 2016-12-25 MED ORDER — BACLOFEN 10 MG PO TABS: 5.0000 mg | ORAL_TABLET | Freq: Two times a day (BID) | ORAL | 6 refills | Status: DC

## 2016-12-25 NOTE — Telephone Encounter (Signed)
PT called and said she is having a hard time getting his baclofen filled at [facility]

## 2016-12-25 NOTE — Telephone Encounter (Signed)
Patient needed new prescription of baclofen sent in to [facility]. Rx from 2017 expired patient has his yearly follow up appointment scheduled

## 2017-02-05 ENCOUNTER — Telehealth: Payer: Self-pay | Admitting: Neurology

## 2017-02-05 NOTE — Telephone Encounter (Signed)
PT needs a prescription for [place] called in

## 2017-02-06 ENCOUNTER — Other Ambulatory Visit: Payer: Self-pay | Admitting: *Deleted

## 2017-02-06 MED ORDER — PREGABALIN 150 MG PO CAPS: 150.0000 mg | ORAL_CAPSULE | Freq: Two times a day (BID) | ORAL | 5 refills | Status: DC

## 2017-02-06 NOTE — Telephone Encounter (Signed)
Rx sent 

## 2017-03-10 DIAGNOSIS — Z794 Long term (current) use of insulin: Secondary | ICD-10-CM | POA: Diagnosis not present

## 2017-03-10 DIAGNOSIS — E119 Type 2 diabetes mellitus without complications: Secondary | ICD-10-CM | POA: Diagnosis not present

## 2017-03-10 DIAGNOSIS — H2513 Age-related nuclear cataract, bilateral: Secondary | ICD-10-CM | POA: Diagnosis not present

## 2017-03-10 DIAGNOSIS — H04123 Dry eye syndrome of bilateral lacrimal glands: Secondary | ICD-10-CM | POA: Diagnosis not present

## 2017-03-11 ENCOUNTER — Telehealth: Payer: Self-pay | Admitting: Internal Medicine

## 2017-03-11 NOTE — Telephone Encounter (Signed)
Pt wife called in and said that pt needs a refill of the insulin aspart protamine- aspart (NOVOLOG MIX 70/30) (70-30) 100 UNIT/ML injection [[id number]]    But she is saying it should be the FLEX PEN???

## 2017-03-12 MED ORDER — INSULIN PEN NEEDLE 32G X 4 MM MISC: 5 refills | Status: DC

## 2017-03-12 MED ORDER — INSULIN ASPART PROT & ASPART (70-30 MIX) 100 UNIT/ML PEN: 5.0000 [IU] | PEN_INJECTOR | Freq: Two times a day (BID) | SUBCUTANEOUS | 5 refills | Status: AC

## 2017-03-12 NOTE — Telephone Encounter (Signed)
Called pt/wife no answer LMOM rx sent to [facility]...Jaime [name]

## 2017-04-28 NOTE — Progress Notes (Signed)
Subjective:    Patient ID: Jaime [name], male    DOB: Feb 09, 1941, 76 y.o.   MRN: [id number]  HPI The patient is here for follow up.  Diabetes: He is taking his medication daily as prescribed. He is not compliant with a diabetic diet. He is not exercising regularly. He monitors his sugars and they have been running 100-130. He checks his feet daily and denies foot lesions. He is up-to-date with an ophthalmology examination.   CAD, Hypertension: He is taking his medication daily. He is compliant with a low sodium diet.  He denies chest pain, palpitations, shortness of breath and regular headaches. He is not exercising regularly.      GERD:  He is taking his medication daily as prescribed.  He denies any GERD symptoms and feels his GERD is well controlled.   He has blood in his urine which started two days ago.  He has a cath with bad permanently.  His urine looks more cloudy.  He denies fever, chills, and abdominal pain.  He does have a history of UTIs and has had sepsis related to an infection in the past.  There has been no increased confusion.   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   Medications and allergies reviewed with patient and updated if appropriate.  Patient Active Problem List   Diagnosis Date Noted  . Hematuria 04/29/2017  . GERD (gastroesophageal reflux disease) 04/10/2016  . Coronary artery disease involving native coronary artery of native heart without angina pectoris 12/28/2015  . PAD (peripheral artery disease) ([place]) 12/28/2015  . Bradycardia 08/14/2015  . Chronic combined systolic and diastolic CHF (congestive heart failure) ([place]) 08/14/2015  . Phantom limb pain ([place]) 12/08/2014  . Syncope 11/01/2014  . Multiple sclerosis, secondary progressive ([place]) 06/10/2014  . Paraparesis of both lower limbs ([place]) 05/31/2014  . Scoliosis, thoracogenic, acquired 03/15/2014  . Mild cognitive impairment 02/11/2014  . PVD (peripheral vascular disease) ([place])  08/13/2013  . S/P AKA (above knee amputation) bilateral ([place]) 08/03/2012  . PAC (premature atrial contraction)   . Peripheral vascular disease, unspecified ([place]) 07/03/2012  . 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 Atherosclerosis of native arteries of the extremities with ulceration(440.23) 04/17/2012  . Ejection fraction   . Cardiomyopathy, ischemic   . Pulmonary hypertension ([place])   . Urinary retention   . Hemiparesis ([place])   . Essential hypertension   . Urinary tract infection after immobility, UCX MRSA 03/26/2012  . Carotid artery disease ([place])   . Hyperlipidemia 07/31/2007  . BPH (benign prostatic hyperplasia) 04/15/2007  . DMII (diabetes mellitus, type 2) ([place]) 12/18/2006    Current Outpatient Prescriptions on File Prior to Visit  Medication Sig Dispense Refill  . albuterol (PROVENTIL HFA;VENTOLIN HFA) 108 (90 Base) MCG/ACT inhaler Inhale 2 puffs into the lungs every 6 (six) hours as needed for wheezing or shortness of breath. 1 Inhaler 2  . aspirin EC 325 MG tablet Take 325 mg by mouth daily.    Marland Kitchen atorvastatin (LIPITOR) 40 MG tablet Take 1 tablet (40 mg total) by mouth daily. 90 tablet 1  . azelastine (ASTELIN) 0.1 % nasal spray Place 2 sprays into both nostrils at bedtime as needed for rhinitis. Use in each nostril as directed 30 mL 3  . baclofen (LIORESAL) 10 MG tablet Take 0.5 tablets (5 mg total) by mouth 2 (two) times daily. 30 tablet 6  . Cholecalciferol (VITAMIN D) 2000 units CAPS Take 1 capsule by mouth daily.     Marland Kitchen  diclofenac sodium (VOLTAREN) 1 % GEL Apply 1 application topically 2 (two) times daily as needed (pain). For pain    . glucagon (GLUCAGEN) 1 MG SOLR injection Inject 1 mg into the vein once as needed for low blood sugar. Reported on 11/16/2015    . insulin aspart protamine - aspart (NOVOLOG 70/30 MIX) (70-30) 100 UNIT/ML FlexPen Inject 0.05 mLs (5 Units total) into the skin 2 (two) times daily. 15 mL 5  . Insulin Pen Needle (BD  PEN NEEDLE NANO U/F) 32G X 4 MM MISC Use twice a day to administer Novolog 70/30 insulin 60 each 5  . lisinopril (PRINIVIL,ZESTRIL) 10 MG tablet TAKE 1 TABLET BY MOUTH EVERY DAY 30 tablet 5  . Multiple Vitamins-Minerals (MULTIVITAMIN WITH MINERALS) tablet Take 1 tablet by mouth daily.    . pantoprazole (PROTONIX) 40 MG tablet Take 1 tablet (40 mg total) by mouth daily with lunch. 90 tablet 1  . pregabalin (LYRICA) 150 MG capsule Take 1 capsule (150 mg total) by mouth 2 (two) times daily. 60 capsule 5  . traMADol (ULTRAM) 50 MG tablet Take 1 tablet (50 mg total) by mouth every 6 (six) hours as needed. 90 tablet 3  . furosemide (LASIX) 20 MG tablet Take 1 tablet (20 mg total) by mouth daily. 90 tablet 3   No current facility-administered medications on file prior to visit.     Past Medical History:  Diagnosis Date  . Atherosclerotic PVD with ulceration ([place])    left foot  . CAD (coronary artery disease) CARDIOLOGIST- DR KATZ-- VISIT 06-05-2011 IN EPIC   non-STEMI, 2007.Marland Kitchenoccluded circumflex.. Taxus stent placed...residual 80% LAD...50% RCA  . Cardiomyopathy, ischemic    EF 45% per ECHO 2008  //   EF 25%, echo, August, 2013 //  Echo (8/15):  Mild LVH, EF 30-35%, ant-lat and lat AK, inf HK, Gr 1 DD, mild MR, mild LAE  . Carotid artery disease ([place])    a.  Doppler, February, 2012, 0-39% bilateral,Mild smooth plaque;  b.  Carotid US (8/15):  Bilateral 1-39% ICA >>> F/u 2 years  . Chronic indwelling Foley catheter   . Constipation   . Dementia   . Diabetes mellitus    INSULIN-DEPENDent  . Fatigue SEVERE  . H/O pleural effusion 2008   POST THORACENTESIS  . History of colon polyps PRECANCEROUS  . Hyperlipidemia   . Hypertension   . Impotence   . Increased prostate specific antigen (PSA) velocity   . Insomnia   . Lung nodule    resolved 11-2006 CT Chest  . Multiple sclerosis ([place]) [place] -- LAST VISIT 11-20-2010  NOTE W/ CHART  . PAC (premature atrial  contraction)    December, 2013  . Pulmonary hypertension ([place])    moderate ECHO Jan 2008  . Scoliosis associated with other condition   . Sleep apnea   . Systolic heart failure   . TIA (transient ischemic attack)   . Tobacco abuse    quit   . Urinary retention    dx ~ 2-12, like from [place], now with a catheter, saw urology  . Urinary tract infection    hx of    Past Surgical History:  Procedure Laterality Date  . ABDOMINAL ANGIOGRAM  12/17/2011   Procedure: ABDOMINAL ANGIOGRAM;  Surgeon: Serafina Mitchell, MD;  Location: [facility] CATH LAB;  Service: Cardiovascular;;  . ABDOMINAL AORTAGRAM N/A 08/19/2013   Procedure: ABDOMINAL Maxcine Ham;  Surgeon: Conrad [place], MD;  Location[facility]  CATH LAB;  Service: Cardiovascular;  Laterality: N/A;  . AMPUTATION  03/17/2012   Procedure: AMPUTATION ABOVE KNEE;  Surgeon: Conrad Garber, MD;  Location: [place];  Service: Vascular;  Laterality: Left;  . AMPUTATION  06/03/2012   Procedure: AMPUTATION ABOVE KNEE;  Surgeon: Conrad [place], MD;  Location: [place];  Service: Vascular;  Laterality: Right;  . CHOLECYSTECTOMY N/A 10/25/2014   Procedure: LAPAROSCOPIC CHOLECYSTECTOMY ;  Surgeon: Coralie Keens, MD;  Location: [place];  Service: General;  Laterality: N/A;  . CORONARY ANGIOPLASTY WITH STENT PLACEMENT  05-01-2006   OCCLUDED CIRCUMFLEX -- TAXUS STENT PLACMENT  AND RESIDUAL 80% LAD,  50% RCA  . CYSTOSCOPY  07/30/2011   Procedure: CYSTOSCOPY;  Surgeon: Hanley Ben, MD;  Location: [facility];  Service: Urology;  Laterality: N/A;  . ERCP N/A 08/25/2014   Procedure: ENDOSCOPIC RETROGRADE CHOLANGIOPANCREATOGRAPHY (ERCP);  Surgeon: Ladene Artist, MD;  Location: [facility] ENDOSCOPY;  Service: Endoscopy;  Laterality: N/A;  . FEMORAL-POPLITEAL BYPASS GRAFT  03/11/2012   Procedure: BYPASS GRAFT FEMORAL-POPLITEAL ARTERY;  Surgeon: Conrad Morriston, MD;  Location: [place];  Service: Vascular;  Laterality: Left;  embolectomy left lower leg  . FEMORAL-TIBIAL BYPASS GRAFT  03/11/2012     Procedure: BYPASS GRAFT FEMORAL-TIBIAL ARTERY;  Surgeon: Conrad [place], MD;  Location: [facility] OR;  Service: Vascular;  Laterality: Left;  Left Femoral -Tibial trunk bypass, Endarterectomy of Tibial- Peroneal trunk with vein angioplasty.  Marland Kitchen HERNIA REPAIR  1990   (R)  . INTRAOPERATIVE ARTERIOGRAM  03/11/2012   Procedure: INTRA OPERATIVE ARTERIOGRAM;  Surgeon: Conrad [place], MD;  Location: [place];  Service: Vascular;  Laterality: Left;  . LOWER EXTREMITY ANGIOGRAM Bilateral 12/17/2011   Procedure: LOWER EXTREMITY ANGIOGRAM;  Surgeon: Serafina Mitchell, MD;  Location: [facility] CATH LAB;  Service: Cardiovascular;  Laterality: Bilateral;  bil lower extrem angio  . THORACENTESIS  2008   PLEURAL EFFUSION  . TRANSURETHRAL RESECTION OF PROSTATE  07/30/2011   Procedure: TRANSURETHRAL RESECTION OF THE PROSTATE (TURP);  Surgeon: Hanley Ben, MD;  Location: [facility];  Service: Urology;  Laterality: N/A;    Social History   Social History  . Marital status: Married    Spouse name: N/A  . Number of children: 2  . Years of education: N/A   Occupational History  . disable Retired   Social History Main Topics  . Smoking status: Former Smoker    Years: 50.00    Quit date: 07/22/2012  . Smokeless tobacco: Current User     Comment: pt states that he is using E-cigs  . Alcohol use No  . Drug use: No  . Sexual activity: No     Comment: electronic cigarettes no nicotene   Other Topics Concern  . Not on file   Social History Narrative   Lives w/ wife    Family History  Problem Relation Age of Onset  . Heart attack Mother 69  . Heart disease Mother   . Stroke Mother   . Hyperlipidemia Mother   . Hypertension Mother   . COPD Father   . Peripheral vascular disease Father   . Diabetes Brother   . Heart disease Brother   . Hypertension Brother   . Heart attack Brother   . Diabetes Daughter   . Colon cancer Neg Hx   . Prostate cancer Neg Hx     Review of Systems  Constitutional:  Positive for appetite change (decreased). Negative for chills and fever.  Respiratory:  Negative for cough, shortness of breath and wheezing.   Cardiovascular: Negative for chest pain and palpitations.  Gastrointestinal: Negative for abdominal pain and nausea.  Genitourinary: Positive for hematuria.       Cloudy urine  Neurological: Negative for light-headedness and headaches.  Psychiatric/Behavioral: Positive for confusion (chronic, no change).       Objective:   Vitals:   04/29/17 1133  BP: 100/60  Pulse: (!) 54  Resp: 16  Temp: 97.9 F (36.6 C)  SpO2: 97%   Wt Readings from Last 3 Encounters:  10/29/16 160 lb (72.6 kg)  07/08/16 146 lb (66.2 kg)  07/06/16 146 lb 12.8 oz (66.6 kg)   There is no height or weight on file to calculate BMI.   Physical Exam    Constitutional: Appears well-developed and well-nourished. No distress.  HENT:  Head: Normocephalic and atraumatic.  Neck: Neck supple. No tracheal deviation present. No thyromegaly present.  No cervical lymphadenopathy Cardiovascular: Normal rate, regular rhythm and normal heart sounds.   2/6 systolic murmur heard. No carotid bruit .  No edema Pulmonary/Chest: Effort normal and breath sounds normal. No respiratory distress. No has no wheezes. No rales.  Abdomen: soft, non tender, non distended Skin: Skin is warm and dry. Not diaphoretic.  Psychiatric: Normal mood and affect. Behavior is normal.      Assessment & Plan:    See Problem List for Assessment and Plan of chronic medical problems.

## 2017-04-29 ENCOUNTER — Encounter: Payer: Self-pay | Admitting: Internal Medicine

## 2017-04-29 ENCOUNTER — Other Ambulatory Visit (INDEPENDENT_AMBULATORY_CARE_PROVIDER_SITE_OTHER): Payer: Medicare Other

## 2017-04-29 ENCOUNTER — Ambulatory Visit (INDEPENDENT_AMBULATORY_CARE_PROVIDER_SITE_OTHER): Payer: Medicare Other | Admitting: Internal Medicine

## 2017-04-29 VITALS — BP 100/60 | HR 54 | Temp 97.9°F | Resp 16

## 2017-04-29 DIAGNOSIS — E7849 Other hyperlipidemia: Secondary | ICD-10-CM

## 2017-04-29 DIAGNOSIS — Z794 Long term (current) use of insulin: Secondary | ICD-10-CM | POA: Diagnosis not present

## 2017-04-29 DIAGNOSIS — R319 Hematuria, unspecified: Secondary | ICD-10-CM | POA: Diagnosis not present

## 2017-04-29 DIAGNOSIS — E1159 Type 2 diabetes mellitus with other circulatory complications: Secondary | ICD-10-CM

## 2017-04-29 DIAGNOSIS — I2510 Atherosclerotic heart disease of native coronary artery without angina pectoris: Secondary | ICD-10-CM | POA: Diagnosis not present

## 2017-04-29 DIAGNOSIS — K219 Gastro-esophageal reflux disease without esophagitis: Secondary | ICD-10-CM | POA: Diagnosis not present

## 2017-04-29 DIAGNOSIS — I10 Essential (primary) hypertension: Secondary | ICD-10-CM

## 2017-04-29 DIAGNOSIS — I255 Ischemic cardiomyopathy: Secondary | ICD-10-CM | POA: Diagnosis not present

## 2017-04-29 LAB — CBC WITH DIFFERENTIAL/PLATELET
BASOS ABS: 0 10*3/uL (ref 0.0–0.1)
Basophils Relative: 0.7 % (ref 0.0–3.0)
EOS ABS: 0.2 10*3/uL (ref 0.0–0.7)
Eosinophils Relative: 3.1 % (ref 0.0–5.0)
HEMATOCRIT: 41 % (ref 39.0–52.0)
HEMOGLOBIN: 13.1 g/dL (ref 13.0–17.0)
LYMPHS PCT: 33.3 % (ref 12.0–46.0)
Lymphs Abs: 2 10*3/uL (ref 0.7–4.0)
MCHC: 31.9 g/dL (ref 30.0–36.0)
MCV: 82.8 fl (ref 78.0–100.0)
Monocytes Absolute: 0.6 10*3/uL (ref 0.1–1.0)
Monocytes Relative: 9.6 % (ref 3.0–12.0)
Neutro Abs: 3.2 10*3/uL (ref 1.4–7.7)
Neutrophils Relative %: 53.3 % (ref 43.0–77.0)
Platelets: 161 10*3/uL (ref 150.0–400.0)
RBC: 4.95 Mil/uL (ref 4.22–5.81)
RDW: 17.3 % — ABNORMAL HIGH (ref 11.5–15.5)
WBC: 6.1 10*3/uL (ref 4.0–10.5)

## 2017-04-29 LAB — HEMOGLOBIN A1C: Hgb A1c MFr Bld: 7.1 % — ABNORMAL HIGH (ref 4.6–6.5)

## 2017-04-29 LAB — COMPREHENSIVE METABOLIC PANEL
ALK PHOS: 51 U/L (ref 39–117)
ALT: 13 U/L (ref 0–53)
AST: 10 U/L (ref 0–37)
Albumin: 3.5 g/dL (ref 3.5–5.2)
BILIRUBIN TOTAL: 0.6 mg/dL (ref 0.2–1.2)
BUN: 21 mg/dL (ref 6–23)
CALCIUM: 9 mg/dL (ref 8.4–10.5)
CHLORIDE: 105 meq/L (ref 96–112)
CO2: 29 mEq/L (ref 19–32)
CREATININE: 0.68 mg/dL (ref 0.40–1.50)
GFR: 146.14 mL/min (ref >60.00)
Glucose, Bld: 105 mg/dL — ABNORMAL HIGH (ref 70–99)
Potassium: 4.6 mEq/L (ref 3.5–5.1)
Sodium: 138 mEq/L (ref 135–145)
Total Protein: 6.8 g/dL (ref 6.0–8.3)

## 2017-04-29 LAB — LIPID PANEL
CHOLESTEROL: 116 mg/dL (ref 0–200)
HDL: 31.2 mg/dL — ABNORMAL LOW (ref >39.00)
LDL CALC: 76 mg/dL (ref 0–99)
NonHDL: 85.02
TRIGLYCERIDES: 44 mg/dL (ref 0.0–149.0)
Total CHOL/HDL Ratio: 4
VLDL: 8.8 mg/dL (ref 0.0–40.0)

## 2017-04-29 LAB — URINALYSIS, ROUTINE W REFLEX MICROSCOPIC
BILIRUBIN URINE: NEGATIVE
NITRITE: NEGATIVE
Specific Gravity, Urine: 1.025 (ref 1.000–1.030)
Total Protein, Urine: NEGATIVE
Urine Glucose: NEGATIVE
Urobilinogen, UA: 0.2 (ref 0.0–1.0)
pH: 6 (ref 5.0–8.0)

## 2017-04-29 MED ORDER — DOXYCYCLINE HYCLATE 100 MG PO TABS: 100.0000 mg | ORAL_TABLET | Freq: Two times a day (BID) | ORAL | 0 refills | Status: DC

## 2017-04-29 NOTE — Assessment & Plan Note (Signed)
No concerning symptoms Follows with cardiology Continue current medications

## 2017-04-29 NOTE — Assessment & Plan Note (Addendum)
For the past two days Has catheter and bag H/o UTI with sepsis Will check UA, UCx Start doxycycline now given the likelihood of an infection

## 2017-04-29 NOTE — Patient Instructions (Addendum)
  Test(s) ordered today. Your results will be released to [place] (or called to you) after review, usually within 72hours after test completion. If any changes need to be made, you will be notified at that same time.   Medications reviewed and updated.  Changes include starting doxycycline for a possible UTI.    Your prescription(s) have been submitted to your pharmacy. Please take as directed and contact our office if you believe you are having problem(s) with the medication(s).   Please followup in 6 months

## 2017-04-29 NOTE — Assessment & Plan Note (Signed)
Check lipid panel  Continue daily statin  healthy diet encouraged  

## 2017-04-29 NOTE — Assessment & Plan Note (Signed)
Check a1c Eye exam up to date Continue diabetic diet Continue current medication

## 2017-04-29 NOTE — Assessment & Plan Note (Signed)
GERD controlled Continue daily medication  

## 2017-04-29 NOTE — Assessment & Plan Note (Signed)
BP Readings from Last 3 Encounters:  04/29/17 100/60  10/29/16 110/62  10/15/16 (!) 108/58    BP well controlled Current regimen effective and well tolerated Continue current medications at current doses cmp

## 2017-05-02 LAB — URINE CULTURE
MICRO NUMBER: 81123123
SPECIMEN QUALITY: ADEQUATE

## 2017-05-03 ENCOUNTER — Encounter: Payer: Self-pay | Admitting: Internal Medicine

## 2017-05-26 ENCOUNTER — Other Ambulatory Visit: Payer: Self-pay | Admitting: Internal Medicine

## 2017-06-10 ENCOUNTER — Encounter: Payer: Self-pay | Admitting: Neurology

## 2017-06-10 ENCOUNTER — Ambulatory Visit (INDEPENDENT_AMBULATORY_CARE_PROVIDER_SITE_OTHER): Payer: Medicare Other | Admitting: Neurology

## 2017-06-10 VITALS — BP 122/84 | HR 66 | Resp 12

## 2017-06-10 DIAGNOSIS — G35 Multiple sclerosis: Secondary | ICD-10-CM

## 2017-06-10 DIAGNOSIS — I255 Ischemic cardiomyopathy: Secondary | ICD-10-CM

## 2017-06-10 DIAGNOSIS — G546 Phantom limb syndrome with pain: Secondary | ICD-10-CM

## 2017-06-10 MED ORDER — TRAMADOL HCL 50 MG PO TABS: 50.0000 mg | ORAL_TABLET | ORAL | 5 refills | Status: DC | PRN

## 2017-06-10 MED ORDER — PREGABALIN 150 MG PO CAPS: ORAL_CAPSULE | ORAL | 5 refills | Status: DC

## 2017-06-10 NOTE — Progress Notes (Signed)
NEUROLOGY FOLLOW UP OFFICE NOTE  Jaime [name] [id number]   HISTORY OF PRESENT ILLNESS: Jaime [name] is a 76 year old right-handed man with diabetes with bilateral above the knee amputations, hyperlipidemia and hypertension who follows up for secondary progressive multiple sclerosis and phantom limb pain.  He is accompanied by his wife who provides some history.  Records reviewed.  UPDATE: For pain, he takes Lyrica 150mg  twice daily.  He also takes baclofen 5mg  twice daily.  He takes tramadol 50mg  2 to 3 times daily.  The phantom limb pain seems to be getting worse, particularly at night.  HISTORY: He was diagnosed with MS in 1993.  Initially, he developed right arm and leg weakness along with balance problems.  Symptoms initially improved but had progressed over the years.  He was on multiple immunomodulating agents such as Betaseron, Copaxone, Cytoxin and monthly Solumedrol.  He currently is not on an agent and hasn't had a flare up in 4 years.  He has bilateral ATK ambutations performed in 2013 as a consequence of his peripheral vascular and diabetes.  He requires assistance with bathing and dressing, but he is able to feed himself.  He uses a power wheelchair.  He had been on amantadine for many years.  MRI Brain w/wo from 11/29/04 demonstrated "extensive white matter disease in the supratentorial compartment, which is consistent with advanced multiple sclerosis.  There is confluence of the white matter lesions with atrophy and hydrocephalus, thinned corpus callosum, and black holes, however somewhat unusual for MS of this degree, no infrantentorial lesions are seen and no active lesions are identified by means of contrast." CT of head was performed on 03/23/12 for encephalopathy, which revealed atrophy and nonspecific small vessel ischemic changes.  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  He has some poor short-term memory. Sometimes he calls his wife by his mother's name.  He  cannot always remember the names of his great grandchildren.  He has more difficulty figuring out how to maneuver the motorized wheelchair.  Prior medication:  Gabapentin was ineffective.  Amantadine was discontinued due to nightmares.  He has [place].  He is unable to sit due to pain and often is laying in bed.  He is frequently turned to avoid pressure ulcers.  PAST MEDICAL HISTORY: Past Medical History:  Diagnosis Date  . Atherosclerotic PVD with ulceration ([place])    left foot  . CAD (coronary artery disease) CARDIOLOGIST- DR KATZ-- VISIT 06-05-2011 IN EPIC   non-STEMI, 2007.Marland Kitchenoccluded circumflex.. Taxus stent placed...residual 80% LAD...50% RCA  . Cardiomyopathy, ischemic    EF 45% per ECHO 2008  //   EF 25%, echo, August, 2013 //  Echo (8/15):  Mild LVH, EF 30-35%, ant-lat and lat AK, inf HK, Gr 1 DD, mild MR, mild LAE  . Carotid artery disease ([place])    a.  Doppler, February, 2012, 0-39% bilateral,Mild smooth plaque;  b.  Carotid US (8/15):  Bilateral 1-39% ICA >>> F/u 2 years  . Chronic indwelling Foley catheter   . Constipation   . Dementia   . Diabetes mellitus    INSULIN-DEPENDent  . Fatigue SEVERE  . H/O pleural effusion 2008   POST THORACENTESIS  . History of colon polyps PRECANCEROUS  . Hyperlipidemia   . Hypertension   . Impotence   . Increased prostate specific antigen (PSA) velocity   . Insomnia   . Lung nodule    resolved 11-2006 CT Chest  . Multiple sclerosis ([place])  [place] -- LAST VISIT 11-20-2010  NOTE W/ CHART  . PAC (premature atrial contraction)    December, 2013  . Pulmonary hypertension ([place])    moderate ECHO Jan 2008  . Scoliosis associated with other condition   . Sleep apnea   . Systolic heart failure   . TIA (transient ischemic attack)   . Tobacco abuse    quit   . Urinary retention    dx ~ 2-12, like from [place], now with a catheter, saw urology  . Urinary tract infection    hx of    MEDICATIONS: Current  Outpatient Medications on File Prior to Visit  Medication Sig Dispense Refill  . albuterol (PROVENTIL HFA;VENTOLIN HFA) 108 (90 Base) MCG/ACT inhaler Inhale 2 puffs into the lungs every 6 (six) hours as needed for wheezing or shortness of breath. 1 Inhaler 2  . aspirin EC 325 MG tablet Take 325 mg by mouth daily.    Marland Kitchen atorvastatin (LIPITOR) 40 MG tablet Take 1 tablet (40 mg total) by mouth daily. 90 tablet 1  . azelastine (ASTELIN) 0.1 % nasal spray Place 2 sprays into both nostrils at bedtime as needed for rhinitis. Use in each nostril as directed 30 mL 3  . baclofen (LIORESAL) 10 MG tablet Take 0.5 tablets (5 mg total) by mouth 2 (two) times daily. 30 tablet 6  . Cholecalciferol (VITAMIN D) 2000 units CAPS Take 1 capsule by mouth daily.     . diclofenac sodium (VOLTAREN) 1 % GEL Apply 1 application topically 2 (two) times daily as needed (pain). For pain    . doxycycline (VIBRA-TABS) 100 MG tablet Take 1 tablet (100 mg total) by mouth 2 (two) times daily. 14 tablet 0  . furosemide (LASIX) 20 MG tablet Take 1 tablet (20 mg total) by mouth daily. 90 tablet 3  . glucagon (GLUCAGEN) 1 MG SOLR injection Inject 1 mg into the vein once as needed for low blood sugar. Reported on 11/16/2015    . insulin aspart protamine - aspart (NOVOLOG 70/30 MIX) (70-30) 100 UNIT/ML FlexPen Inject 0.05 mLs (5 Units total) into the skin 2 (two) times daily. 15 mL 5  . Insulin Pen Needle (BD PEN NEEDLE NANO U/F) 32G X 4 MM MISC Use twice a day to administer Novolog 70/30 insulin 60 each 5  . lisinopril (PRINIVIL,ZESTRIL) 10 MG tablet TAKE 1 TABLET BY MOUTH EVERY DAY 30 tablet 5  . Multiple Vitamins-Minerals (MULTIVITAMIN WITH MINERALS) tablet Take 1 tablet by mouth daily.    . pantoprazole (PROTONIX) 40 MG tablet Take 1 tablet (40 mg total) by mouth daily with lunch. 90 tablet 1   No current facility-administered medications on file prior to visit.     ALLERGIES: Allergies  Allergen Reactions  . Bee Venom Anaphylaxis   . Beta Adrenergic Blockers Other (See Comments)    Bradycardia with carvedilol  . Carvedilol Other (See Comments)    Bradycardia with all Beta blockers   . Influenza Vaccines Other (See Comments)    Sick for months    FAMILY HISTORY: Family History  Problem Relation Age of Onset  . Heart attack Mother 105  . Heart disease Mother   . Stroke Mother   . Hyperlipidemia Mother   . Hypertension Mother   . COPD Father   . Peripheral vascular disease Father   . Diabetes Brother   . Heart disease Brother   . Hypertension Brother   . Heart attack Brother   .  Diabetes Daughter   . Colon cancer Neg Hx   . Prostate cancer Neg Hx     SOCIAL HISTORY: Social History   Socioeconomic History  . Marital status: Married    Spouse name: Not on file  . Number of children: 2  . Years of education: Not on file  . Highest education level: Not on file  Social Needs  . Financial resource strain: Not on file  . Food insecurity - worry: Not on file  . Food insecurity - inability: Not on file  . Transportation needs - medical: Not on file  . Transportation needs - non-medical: Not on file  Occupational History  . Occupation: disable    Employer: RETIRED  Tobacco Use  . Smoking status: Former Smoker    Years: 50.00    Last attempt to quit: 07/22/2012    Years since quitting: 4.8  . Smokeless tobacco: Current User  . Tobacco comment: pt states that he is using E-cigs  Substance and Sexual Activity  . Alcohol use: No    Alcohol/week: 0.0 oz  . Drug use: No  . Sexual activity: No    Comment: electronic cigarettes no nicotene  Other Topics Concern  . Not on file  Social History Narrative   Lives w/ wife    REVIEW OF SYSTEMS: Constitutional: No fevers, chills, or sweats, no generalized fatigue, change in appetite Eyes: No visual changes, double vision, eye pain Ear, nose and throat: No hearing loss, ear pain, nasal congestion, sore throat Cardiovascular: No chest pain,  palpitations Respiratory:  No shortness of breath at rest or with exertion, wheezes GastrointestinaI: No nausea, vomiting, diarrhea, abdominal pain, fecal incontinence Genitourinary:  No dysuria, urinary retention or frequency Musculoskeletal:  No neck pain, back pain Integumentary: No rash, pruritus, skin lesions Neurological: as above Psychiatric: No depression, insomnia, anxiety Endocrine: No palpitations, fatigue, diaphoresis, mood swings, change in appetite, change in weight, increased thirst Hematologic/Lymphatic:  No purpura, petechiae. Allergic/Immunologic: no itchy/runny eyes, nasal congestion, recent allergic reactions, rashes  PHYSICAL EXAM: Vitals:   06/10/17 1200  BP: 122/84  Pulse: 66  Resp: 12  SpO2: 96%   General: No acute distress.   Head:  Normocephalic/atraumatic Eyes:  Fundi examined but not visualized Neck: supple, no paraspinal tenderness, full range of motion Heart:  Regular rate and rhythm Lungs:  Clear to auscultation bilaterally Back: No paraspinal tenderness Neurological Exam: alert and oriented to person, place, and time. Attention span and concentration intact, recent and remote memory intact, fund of knowledge intact.  Speech fluent and not dysarthric, language intact.  CN II-XII intact. Fundi not able to be visualized on inspection.  Mildly inncreased tone in upper extremities, 5-/5 right triceps, otherwise muscle strength 5/5 in upper extremities.  Sensation to light touch intact.  Deep tendon reflexes 2+ in upper extremities.  Finger to nose intact.   IMPRESSION: Secondary progressive MS, clinically stable Phantom limb pain  PLAN: 1.  We will continue Lyrica 150mg  in AM but increase evening dose to 300mg  2.  He may take tramadol up to 4 times daily (every 4 hours) as needed 3.  Follow up in 6 months.  25 minutes spent face to face with patient, over 50% spent discussing management.  Metta Clines, DO  CC:  Billey Gosling, MD

## 2017-06-10 NOTE — Patient Instructions (Signed)
1.  You may take tramadol every 4 hours (up to 4 times daily) as needed for pain. 2.  We will increase Lyrica 150mg  capsules to 1 capsule in morning and 2 capsules at night 3.  Follow up in 6 months.

## 2017-06-23 ENCOUNTER — Encounter: Payer: Self-pay | Admitting: Cardiology

## 2017-07-07 ENCOUNTER — Encounter: Payer: Self-pay | Admitting: *Deleted

## 2017-07-07 ENCOUNTER — Ambulatory Visit (INDEPENDENT_AMBULATORY_CARE_PROVIDER_SITE_OTHER): Payer: Medicare Other | Admitting: Cardiology

## 2017-07-07 DIAGNOSIS — I255 Ischemic cardiomyopathy: Secondary | ICD-10-CM

## 2017-07-07 DIAGNOSIS — E782 Mixed hyperlipidemia: Secondary | ICD-10-CM | POA: Diagnosis not present

## 2017-07-07 DIAGNOSIS — G629 Polyneuropathy, unspecified: Secondary | ICD-10-CM

## 2017-07-07 DIAGNOSIS — I2510 Atherosclerotic heart disease of native coronary artery without angina pectoris: Secondary | ICD-10-CM

## 2017-07-07 DIAGNOSIS — I10 Essential (primary) hypertension: Secondary | ICD-10-CM

## 2017-07-07 MED ORDER — ASPIRIN EC 81 MG PO TBEC: 81.0000 mg | DELAYED_RELEASE_TABLET | Freq: Every day | ORAL | 3 refills | Status: AC

## 2017-07-07 NOTE — Patient Instructions (Signed)
Medication Instructions:   HOLD ATORVASTATIN X 1 WEEK THEN CALL THE OFFICE BACK TO REPORT IF YOUR SYMPTOMS HAVE IMPROVED OR NOT.  PLEASE CALL NEXT Monday 07/14/17 TO REPORT SYMPTOMS  DECREASE YOUR ASPIRIN TO 60 MG ONCE DAILY    Follow-Up:  Your physician wants you to follow-up in: [place] will receive a reminder letter in the mail two months in advance. If you don't receive a letter, please call our office to schedule the follow-up appointment.      If you need a refill on your cardiac medications before your next appointment, please call your pharmacy.

## 2017-07-07 NOTE — Progress Notes (Signed)
Cardiology Office Note    Date:  07/07/2017   ID:  Jaime [name], DOB 1941-07-05, MRN [id number]  PCP:  [name], MD  Cardiologist:  Dr Ron Parker -->  [name], MD   No chief complaint on file.  History of Present Illness:  Jaime [name] is a 76 y.o. male with h/o MS, s/p B/L AKA, wheelchair bound, incontinence, CAD, s/p PCI to LCX in 2007, with h/o ischemic CMP, the last LVEF 35% on echo in 07/2015, mildly reduced RVEF. H/O PAD.  He used to see Dr Ron Parker, the last visit in 2015.  07/11/2016 - The patient is coming after 6 months, he is in a great mood and states that he feels great. He has been admitted on 28/78/67 with UTI complicated by septic shock requiring Levophed.  He Went to the ER again on 07/08/16 with dizziness, fatigue and was found to be bradycardic on BB that were held. His symptoms have improved since then. He has hard time to cough out the phlem, no fever/ chills. Denies chest pain or DOE, no palpitations or syncope. He sleeps upright in a hospital bed and has no PND. No palpitations or syncope.  10/15/2016 - the patient is coming after 3 months, in the interim he states he has been feeling well. Denies any chest pain, shortness of breath, no palpitations or dizziness. He only gets out of bed for a short time every day he and doesn't like to leave the house. He appears to be in good spirits. He has had upper respiratory infection with productive cough however no fever or chills. He feels like the phlegm is stuck in his throat and he can't cough it out.  07/07/2017 - patient is coming after 9 months, he has been doing well from cardiac standpoint denies any chest pain shortness of breath, she sleeps in a flat position in bed and denies any orthopnea or paroxysmal nocturnal dyspnea. He has had poor appetite and lost 6 pounds in the last 6 months. His most serious concern are like pains and he attributes them phantom pains after amputation. They have been really bad  in the last couple weeks.  Past Medical History:  Diagnosis Date  . Atherosclerotic PVD with ulceration ([place])    left foot  . CAD (coronary artery disease) CARDIOLOGIST- DR KATZ-- VISIT 06-05-2011 IN [facility]   non-STEMI, 2007.Marland Kitchenoccluded circumflex.. Taxus stent placed...residual 80% LAD...50% RCA  . Cardiomyopathy, ischemic    EF 45% per ECHO 2008  //   EF 25%, echo, August, 2013 //  Echo (8/15):  Mild LVH, EF 30-35%, ant-lat and lat AK, inf HK, Gr 1 DD, mild MR, mild LAE  . Carotid artery disease ([place])    a.  Doppler, February, 2012, 0-39% bilateral,Mild smooth plaque;  b.  Carotid US (8/15):  Bilateral 1-39% ICA >>> F/u 2 years  . Chronic indwelling Foley catheter   . Constipation   . Dementia   . Diabetes mellitus    INSULIN-DEPENDent  . Fatigue SEVERE  . H/O pleural effusion 2008   POST THORACENTESIS  . History of colon polyps PRECANCEROUS  . Hyperlipidemia   . Hypertension   . Impotence   . Increased prostate specific antigen (PSA) velocity   . Insomnia   . Lung nodule    resolved 11-2006 CT Chest  . Multiple sclerosis ([place]) [place] -- LAST VISIT 11-20-2010  NOTE W/ CHART  . PAC (premature atrial contraction)  December, 2013  . Pulmonary hypertension ([place])    moderate ECHO Jan 2008  . Scoliosis associated with other condition   . Sleep apnea   . Systolic heart failure   . TIA (transient ischemic attack)   . Tobacco abuse    quit   . Urinary retention    dx ~ 2-12, like from [place], now with a catheter, saw urology  . Urinary tract infection    hx of    Past Surgical History:  Procedure Laterality Date  . ABDOMINAL ANGIOGRAM  12/17/2011   Procedure: ABDOMINAL ANGIOGRAM;  Surgeon: Serafina Mitchell, MD;  Location: [facility] CATH LAB;  Service: Cardiovascular;;  . ABDOMINAL AORTAGRAM N/A 08/19/2013   Procedure: ABDOMINAL Maxcine Ham;  Surgeon: Conrad [place], MD;  Location: [facility] CATH LAB;  Service: Cardiovascular;  Laterality: N/A;  . AMPUTATION  03/17/2012    Procedure: AMPUTATION ABOVE KNEE;  Surgeon: Conrad Glenvil, MD;  Location: [place];  Service: Vascular;  Laterality: Left;  . AMPUTATION  06/03/2012   Procedure: AMPUTATION ABOVE KNEE;  Surgeon: Conrad [place], MD;  Location: [place];  Service: Vascular;  Laterality: Right;  . CHOLECYSTECTOMY N/A 10/25/2014   Procedure: LAPAROSCOPIC CHOLECYSTECTOMY ;  Surgeon: Coralie Keens, MD;  Location: [place];  Service: General;  Laterality: N/A;  . CORONARY ANGIOPLASTY WITH STENT PLACEMENT  05-01-2006   OCCLUDED CIRCUMFLEX -- TAXUS STENT PLACMENT  AND RESIDUAL 80% LAD,  50% RCA  . CYSTOSCOPY  07/30/2011   Procedure: CYSTOSCOPY;  Surgeon: Hanley Ben, MD;  Location: [facility];  Service: Urology;  Laterality: N/A;  . ERCP N/A 08/25/2014   Procedure: ENDOSCOPIC RETROGRADE CHOLANGIOPANCREATOGRAPHY (ERCP);  Surgeon: Ladene Artist, MD;  [facility] ENDOSCOPY;  Service: Endoscopy;  Laterality: N/A;  . FEMORAL-POPLITEAL BYPASS GRAFT  03/11/2012   Procedure: BYPASS GRAFT FEMORAL-POPLITEAL ARTERY;  Surgeon: Conrad [place], MD;  Location: [place];  Service: Vascular;  Laterality: Left;  embolectomy left lower leg  . FEMORAL-TIBIAL BYPASS GRAFT  03/11/2012   Procedure: BYPASS GRAFT FEMORAL-TIBIAL ARTERY;  Surgeon: Conrad Orang[place]ree, MD;  Location[facility] OR;  Service: Vascular;  Laterality: Left;  Left Femoral -Tibial trunk bypass, Endarterectomy of Tibial- Peroneal trunk with vein angioplasty.  Marland Kitchen HERNIA REPAIR  1990   (R)  . INTRAOPERATIVE ARTERIOGRAM  03/11/2012   Procedure: INTRA OPERATIVE ARTERIOGRAM;  Surgeon: Conrad [place], MD;  Location: [place];  Service: Vascular;  Laterality: Left;  . LOWER EXTREMITY ANGIOGRAM Bilateral 12/17/2011   Procedure: LOWER EXTREMITY ANGIOGRAM;  Surgeon: Serafina Mitchell, MD;  Location: [facility] CATH LAB;  Service: Cardiovascular;  Laterality: Bilateral;  bil lower extrem angio  . THORACENTESIS  2008   PLEURAL EFFUSION  . TRANSURETHRAL RESECTION OF PROSTATE  07/30/2011   Procedure: TRANSURETHRAL  RESECTION OF THE PROSTATE (TURP);  Surgeon: Hanley Ben, MD;  [facility];  Service: Urology;  Laterality: N/A;    Current Medications: Outpatient Medications Prior to Visit  Medication Sig Dispense Refill  . albuterol (PROVENTIL HFA;VENTOLIN HFA) 108 (90 Base) MCG/ACT inhaler Inhale 2 puffs into the lungs every 6 (six) hours as needed for wheezing or shortness of breath. 1 Inhaler 2  . atorvastatin (LIPITOR) 40 MG tablet Take 1 tablet (40 mg total) by mouth daily. 90 tablet 1  . azelastine (ASTELIN) 0.1 % nasal spray Place 2 sprays into both nostrils at bedtime as needed for rhinitis. Use in each nostril as directed 30 mL 3  . baclofen (LIORESAL) 10 MG tablet Take 0.5  tablets (5 mg total) by mouth 2 (two) times daily. 30 tablet 6  . Cholecalciferol (VITAMIN D) 2000 units CAPS Take 1 capsule by mouth daily.     . diclofenac sodium (VOLTAREN) 1 % GEL Apply 1 application topically 2 (two) times daily as needed (pain). For pain    . furosemide (LASIX) 20 MG tablet Take 1 tablet (20 mg total) by mouth daily. 90 tablet 3  . glucagon (GLUCAGEN) 1 MG SOLR injection Inject 1 mg into the vein once as needed for low blood sugar. Reported on 11/16/2015    . insulin aspart protamine - aspart (NOVOLOG 70/30 MIX) (70-30) 100 UNIT/ML FlexPen Inject 0.05 mLs (5 Units total) into the skin 2 (two) times daily. 15 mL 5  . Insulin Pen Needle (BD PEN NEEDLE NANO U/F) 32G X 4 MM MISC Use twice a day to administer Novolog 70/30 insulin 60 each 5  . lisinopril (PRINIVIL,ZESTRIL) 10 MG tablet TAKE 1 TABLET BY MOUTH EVERY DAY 30 tablet 5  . Multiple Vitamins-Minerals (MULTIVITAMIN WITH MINERALS) tablet Take 1 tablet by mouth daily.    . pantoprazole (PROTONIX) 40 MG tablet Take 1 tablet (40 mg total) by mouth daily with lunch. 90 tablet 1  . pregabalin (LYRICA) 150 MG capsule Take 1 capsule in morning and 2 capsules at night. 90 capsule 5  . traMADol (ULTRAM) 50 MG tablet Take 1 tablet (50 mg  total) by mouth every 4 (four) hours as needed. 120 tablet 5  . aspirin EC 325 MG tablet Take 325 mg by mouth daily.    Marland Kitchen doxycycline (VIBRA-TABS) 100 MG tablet Take 1 tablet (100 mg total) by mouth 2 (two) times daily. (Patient not taking: Reported on 07/07/2017) 14 tablet 0   No facility-administered medications prior to visit.      Allergies:   Bee venom; Beta adrenergic blockers; Carvedilol; and Influenza vaccines   Social History   Socioeconomic History  . Marital status: Married    Spouse name: Not on file  . Number of children: 2  . Years of education: Not on file  . Highest education level: Not on file  Social Needs  . Financial resource strain: Not on file  . Food insecurity - worry: Not on file  . Food insecurity - inability: Not on file  . Transportation needs - medical: Not on file  . Transportation needs - non-medical: Not on file  Occupational History  . Occupation: disable    Employer: RETIRED  Tobacco Use  . Smoking status: Former Smoker    Years: 50.00    Last attempt to quit: 07/22/2012    Years since quitting: 4.9  . Smokeless tobacco: Current User  . Tobacco comment: pt states that he is using E-cigs  Substance and Sexual Activity  . Alcohol use: No    Alcohol/week: 0.0 oz  . Drug use: No  . Sexual activity: No    Comment: electronic cigarettes no nicotene  Other Topics Concern  . Not on file  Social History Narrative   Lives w/ wife     Family History:  The patient's family history includes COPD in his father; Diabetes in his brother and daughter; Heart attack in his brother; Heart attack (age of onset: 23) in his mother; Heart disease in his brother and mother; Hyperlipidemia in his mother; Hypertension in his brother and mother; Peripheral vascular disease in his father; Stroke in his mother.   ROS:   Please see the history of present illness.  ROS All other systems reviewed and are negative.   PHYSICAL EXAM:   VS:  There were no vitals  taken for this visit.   GEN: in no acute distress, in an electric wheelchair, with difficulties to move HEENT: normal  Neck: no JVD, carotid bruits, or masses Cardiac: RRR; no murmurs, rubs, or gallops,no edema, AKA ampuation B/L Respiratory:  clear to auscultation bilaterally, normal work of breathing, no wheezing. GI: soft, nontender, nondistended, + BS Skin: warm and dry, no rash Neuro:  Alert and Oriented x 3, Strength and sensation are intact Psych: euthymic mood, full affect  Wt Readings from Last 3 Encounters:  10/29/16 160 lb (72.6 kg)  07/08/16 146 lb (66.2 kg)  07/06/16 146 lb 12.8 oz (66.6 kg)    Studies/Labs Reviewed:   EKG:  EKG is not ordered today.   Recent Labs: 04/29/2017: ALT 13; BUN 21; Creatinine, Ser 0.68; Hemoglobin 13.1; Platelets 161.0; Potassium 4.6; Sodium 138   Lipid Panel    Component Value Date/Time   CHOL 116 04/29/2017 1211   TRIG 44.0 04/29/2017 1211   HDL 31.20 (L) 04/29/2017 1211   CHOLHDL 4 04/29/2017 1211   VLDL 8.8 04/29/2017 1211   LDLCALC 76 04/29/2017 1211   LDLDIRECT 123.5 03/17/2008 1049   Additional studies/ records that were reviewed today include:   08/14/2015 Left ventricle: The cavity size was normal. Systolic function was  moderately reduced. The estimated ejection fraction was 35%.  Diffuse hypokinesis worse in the inferior and inferolateral  regions. Doppler parameters are consistent with abnormal left  ventricular relaxation (grade 1 diastolic dysfunction). - Mitral valve: There was mild regurgitation. - Right ventricle: The cavity size was normal. Wall thickness was  normal. Systolic function was mildly reduced. - Tricuspid valve: There was mild regurgitation. - Inferior vena cava: The vessel was normal in size. The  respirophasic diameter changes were in the normal range (>= 50%),  consistent with normal central venous pressure.    ASSESSMENT:    1. Coronary artery disease involving native coronary artery  of native heart without angina pectoris   2. Cardiomyopathy, ischemic   3. Essential hypertension      PLAN:  In order of problems listed above:  1. CAD - asymptomatic, No EKG today, however patient asymptomatic, will continue medical management for CAD, no ischemic workup is needed right now. Decrease aspirin 3 and 25 mg daily to 81 mg daily.  2. Ischemic CMP, lVEF stable at 35%, no palpitations or syncope,  no BB as has recurrent bradycardia, continue lisinopril.  3. Chronic systolic CHF - no evidence of fluid overload. Continue Lasix 20 mg daily.  4. Hypertension - controlled.  5. Severe lower extremity pain - differential includes myalgias from statins versus neuropathy with phantom pain, we'll try to discontinue atorvastatin for a week and if there is any improvement we'll switch to another statin.   Medication Adjustments/Labs and Tests Ordered: Current medicines are reviewed at length with the patient today.  Concerns regarding medicines are outlined above.  Medication changes, Labs and Tests ordered today are listed in the Patient Instructions below. Patient Instructions  Medication Instructions:   HOLD ATORVASTATIN X 1 WEEK THEN CALL THE OFFICE BACK TO REPORT IF YOUR SYMPTOMS HAVE IMPROVED OR NOT.  PLEASE CALL NEXT Monday 07/14/17 TO REPORT SYMPTOMS  DECREASE YOUR ASPIRIN TO 80 MG ONCE DAILY    Follow-Up:  Your physician wants you to follow-up in: [place] will receive a reminder letter in the  mail two months in advance. If you don't receive a letter, please call our office to schedule the follow-up appointment.      If you need a refill on your cardiac medications before your next appointment, please call your pharmacy.      Signed, [name], MD  07/07/2017 4:47 PM    [address], [place], [place]  [address] Phone: [phone]; Fax: [phone]

## 2017-07-16 ENCOUNTER — Telehealth: Payer: Self-pay | Admitting: Cardiology

## 2017-07-16 NOTE — Telephone Encounter (Signed)
New message    Patient wife calling with concerns about really "cold hand and arms". Patient wife states he has no other issues. Please call

## 2017-07-16 NOTE — Telephone Encounter (Signed)
Patient's wife states that the patient's hands and wrists have been cold for over a month. She states that this was going on when the patient saw Dr. Meda Coffee on 12/17 and that he was instructed to stop his atorvastatin. She states that this has not helped. She states that the patient is not having any pain, numbness or tingling, discoloration, or any other symptoms. She states that the patient is not taking any new medicines. Patient is not on a BB. Advised for the patient to follow up with PCP regarding this issue and that the information would be forwarded to Dr. Meda Coffee for review. Wife verbalized understanding and thanked me for the call.

## 2017-07-17 NOTE — Telephone Encounter (Signed)
Please have him restart atorvastatin

## 2017-07-17 NOTE — Progress Notes (Signed)
Subjective:    Patient ID: Jaime [name], male    DOB: May 31, 1941, 76 y.o.   MRN: [id number]  HPI She is here for an acute visit.    Patient cold all the time:  His bilateral wrists up to his elbows feel cool to touch.  Usually his hands are cool as well.  They feel cold all the time and feel cool to touch.  It started about three months ago and has been the same since.  Currently he does not feel cold, but his wife states that usually he does.  Putting extra sleeves on his lower arms and putting blankets on his helps a little.  He denies numbness or tingling in his arms.  His arms have gotten a little weaker - he is not able to hang on to his trapezius bar as long, which he uses for transfers.    Hyperlipidemia:  He was taken off the atorvastatin by Dr Meda Coffee.  They were concerned the medication was causing severe pain was bilateral amputation stumps.  After stopping the medication the pain has resolved and he currently does not have it.  He did call his cardiologist and they advised since that he was seeing me to discuss what he should start taking.  Diabetes: He is taking his medication daily as prescribed. He is compliant with a diabetic diet. He is not exercising regularly. He monitors his sugars and they have been running 96-130.   Medications and allergies reviewed with patient and updated if appropriate.  Patient Active Problem List   Diagnosis Date Noted  . Hematuria 04/29/2017  . GERD (gastroesophageal reflux disease) 04/10/2016  . Coronary artery disease involving native coronary artery of native heart without angina pectoris 12/28/2015  . PAD (peripheral artery disease) ([place]) 12/28/2015  . Bradycardia 08/14/2015  . Chronic combined systolic and diastolic CHF (congestive heart failure) ([place]) 08/14/2015  . Phantom limb pain ([place]) 12/08/2014  . Syncope 11/01/2014  . Multiple sclerosis, secondary progressive ([place]) 06/10/2014  . Paraparesis of both lower limbs ([place])  05/31/2014  . Scoliosis, thoracogenic, acquired 03/15/2014  . Mild cognitive impairment 02/11/2014  . PVD (peripheral vascular disease) ([place]) 08/13/2013  . S/P AKA (above knee amputation) bilateral ([place]) 08/03/2012  . PAC (premature atrial contraction)   . Peripheral vascular disease, unspecified ([place]) 07/03/2012  . 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 Atherosclerosis of native arteries of the extremities with ulceration(440.23) 04/17/2012  . Ejection fraction   . Cardiomyopathy, ischemic   . Pulmonary hypertension ([place])   . Urinary retention   . Hemiparesis ([place])   . Essential hypertension   . Urinary tract infection after immobility, UCX MRSA 03/26/2012  . Carotid artery disease ([place])   . Hyperlipidemia 07/31/2007  . BPH (benign prostatic hyperplasia) 04/15/2007  . DMII (diabetes mellitus, type 2) ([place]) 12/18/2006    Current Outpatient Medications on File Prior to Visit  Medication Sig Dispense Refill  . albuterol (PROVENTIL HFA;VENTOLIN HFA) 108 (90 Base) MCG/ACT inhaler Inhale 2 puffs into the lungs every 6 (six) hours as needed for wheezing or shortness of breath. 1 Inhaler 2  . aspirin EC 81 MG tablet Take 1 tablet (81 mg total) by mouth daily. 90 tablet 3  . azelastine (ASTELIN) 0.1 % nasal spray Place 2 sprays into both nostrils at bedtime as needed for rhinitis. Use in each nostril as directed 30 mL 3  . baclofen (LIORESAL) 10 MG tablet Take 0.5 tablets (5 mg total) by mouth 2 (  two) times daily. 30 tablet 6  . Cholecalciferol (VITAMIN D) 2000 units CAPS Take 1 capsule by mouth daily.     . diclofenac sodium (VOLTAREN) 1 % GEL Apply 1 application topically 2 (two) times daily as needed (pain). For pain    . furosemide (LASIX) 20 MG tablet Take 1 tablet (20 mg total) by mouth daily. 90 tablet 3  . glucagon (GLUCAGEN) 1 MG SOLR injection Inject 1 mg into the vein once as needed for low blood sugar. Reported on 11/16/2015    . insulin aspart  protamine - aspart (NOVOLOG 70/30 MIX) (70-30) 100 UNIT/ML FlexPen Inject 0.05 mLs (5 Units total) into the skin 2 (two) times daily. 15 mL 5  . Insulin Pen Needle (BD PEN NEEDLE NANO U/F) 32G X 4 MM MISC Use twice a day to administer Novolog 70/30 insulin 60 each 5  . lisinopril (PRINIVIL,ZESTRIL) 10 MG tablet TAKE 1 TABLET BY MOUTH EVERY DAY 30 tablet 5  . Multiple Vitamins-Minerals (MULTIVITAMIN WITH MINERALS) tablet Take 1 tablet by mouth daily.    . pantoprazole (PROTONIX) 40 MG tablet Take 1 tablet (40 mg total) by mouth daily with lunch. 90 tablet 1  . pregabalin (LYRICA) 150 MG capsule Take 1 capsule in morning and 2 capsules at night. 90 capsule 5  . traMADol (ULTRAM) 50 MG tablet Take 1 tablet (50 mg total) by mouth every 4 (four) hours as needed. 120 tablet 5  . atorvastatin (LIPITOR) 40 MG tablet Take 1 tablet (40 mg total) by mouth daily. (Patient not taking: Reported on 07/18/2017) 90 tablet 1   No current facility-administered medications on file prior to visit.     Past Medical History:  Diagnosis Date  . Atherosclerotic PVD with ulceration ([place])    left foot  . CAD (coronary artery disease) CARDIOLOGIST- DR KATZ-- VISIT 06-05-2011 IN [facility]   non-STEMI, 2007.Marland Kitchenoccluded circumflex.. Taxus stent placed...residual 80% LAD...50% RCA  . Cardiomyopathy, ischemic    EF 45% per ECHO 2008  //   EF 25%, echo, August, 2013 //  Echo (8/15):  Mild LVH, EF 30-35%, ant-lat and lat AK, inf HK, Gr 1 DD, mild MR, mild LAE  . Carotid artery disease ([place])    a.  Doppler, February, 2012, 0-39% bilateral,Mild smooth plaque;  b.  Carotid US (8/15):  Bilateral 1-39% ICA >>> F/u 2 years  . Chronic indwelling Foley catheter   . Constipation   . Dementia   . Diabetes mellitus    INSULIN-DEPENDent  . Fatigue SEVERE  . H/O pleural effusion 2008   POST THORACENTESIS  . History of colon polyps PRECANCEROUS  . Hyperlipidemia   . Hypertension   . Impotence   . Increased prostate specific antigen (PSA)  velocity   . Insomnia   . Lung nodule    resolved 11-2006 CT Chest  . Multiple sclerosis ([place]) [place] -- LAST VISIT 11-20-2010  NOTE W/ CHART  . PAC (premature atrial contraction)    December, 2013  . Pulmonary hypertension ([place])    moderate ECHO Jan 2008  . Scoliosis associated with other condition   . Sleep apnea   . Systolic heart failure   . TIA (transient ischemic attack)   . Tobacco abuse    quit   . Urinary retention    dx ~ 2-12, like from [place], now with a catheter, saw urology  . Urinary tract infection    hx of    Past Surgical History:  Procedure Laterality Date  . ABDOMINAL ANGIOGRAM  12/17/2011   Procedure: ABDOMINAL ANGIOGRAM;  Surgeon: Serafina Mitchell, MD;  Location: [facility] CATH LAB;  Service: Cardiovascular;;  . ABDOMINAL AORTAGRAM N/A 08/19/2013   Procedure: ABDOMINAL Maxcine Ham;  Surgeon: Conrad Hondah, MD;  Location: [facility] CATH LAB;  Service: Cardiovascular;  Laterality: N/A;  . AMPUTATION  03/17/2012   Procedure: AMPUTATION ABOVE KNEE;  Surgeon: Conrad [place], MD;  Location: [place];  Service: Vascular;  Laterality: Left;  . AMPUTATION  06/03/2012   Procedure: AMPUTATION ABOVE KNEE;  Surgeon: Conrad Marysville, MD;  Location: [place];  Service: Vascular;  Laterality: Right;  . CHOLECYSTECTOMY N/A 10/25/2014   Procedure: LAPAROSCOPIC CHOLECYSTECTOMY ;  Surgeon: Coralie Keens, MD;  Location: [place];  Service: General;  Laterality: N/A;  . CORONARY ANGIOPLASTY WITH STENT PLACEMENT  05-01-2006   OCCLUDED CIRCUMFLEX -- TAXUS STENT PLACMENT  AND RESIDUAL 80% LAD,  50% RCA  . CYSTOSCOPY  07/30/2011   Procedure: CYSTOSCOPY;  Surgeon: Hanley Ben, MD;  Location[facility];  Service: Urology;  Laterality: N/A;  . ERCP N/A 08/25/2014   Procedure: ENDOSCOPIC RETROGRADE CHOLANGIOPANCREATOGRAPHY (ERCP);  Surgeon: Ladene Artist, MD;  Location[facility] ENDOSCOPY;  Service: Endoscopy;  Laterality: N/A;  . FEMORAL-POPLITEAL BYPASS GRAFT  03/11/2012    Procedure: BYPASS GRAFT FEMORAL-POPLITEAL ARTERY;  Surgeon: Conrad Dawson, MD;  Location: [place];  Service: Vascular;  Laterality: Left;  embolectomy left lower leg  . FEMORAL-TIBIAL BYPASS GRAFT  03/11/2012   Procedure: BYPASS GRAFT FEMORAL-TIBIAL ARTERY;  Surgeon: Conrad [place], MD;  Location: [facility] OR;  Service: Vascular;  Laterality: Left;  Left Femoral -Tibial trunk bypass, Endarterectomy of Tibial- Peroneal trunk with vein angioplasty.  Marland Kitchen HERNIA REPAIR  1990   (R)  . INTRAOPERATIVE ARTERIOGRAM  03/11/2012   Procedure: INTRA OPERATIVE ARTERIOGRAM;  Surgeon: Conrad Marysville, MD;  Location: [place];  Service: Vascular;  Laterality: Left;  . LOWER EXTREMITY ANGIOGRAM Bilateral 12/17/2011   Procedure: LOWER EXTREMITY ANGIOGRAM;  Surgeon: Serafina Mitchell, MD;  Location: [facility] CATH LAB;  Service: Cardiovascular;  Laterality: Bilateral;  bil lower extrem angio  . THORACENTESIS  2008   PLEURAL EFFUSION  . TRANSURETHRAL RESECTION OF PROSTATE  07/30/2011   Procedure: TRANSURETHRAL RESECTION OF THE PROSTATE (TURP);  Surgeon: Hanley Ben, MD;  Location: [facility];  Service: Urology;  Laterality: N/A;    Social History   Socioeconomic History  . Marital status: Married    Spouse name: None  . Number of children: 2  . Years of education: None  . Highest education level: None  Social Needs  . Financial resource strain: None  . Food insecurity - worry: None  . Food insecurity - inability: None  . Transportation needs - medical: None  . Transportation needs - non-medical: None  Occupational History  . Occupation: disable    Employer: RETIRED  Tobacco Use  . Smoking status: Former Smoker    Years: 50.00    Last attempt to quit: 07/22/2012    Years since quitting: 4.9  . Smokeless tobacco: Current User  . Tobacco comment: pt states that he is using E-cigs  Substance and Sexual Activity  . Alcohol use: No    Alcohol/week: 0.0 oz  . Drug use: No  . Sexual activity: No    Comment:  electronic cigarettes no nicotene  Other Topics Concern  . None  Social History Narrative   Lives w/ wife    Family History  Problem Relation Age of Onset  . Heart attack Mother 76  . Heart disease Mother   . Stroke Mother   . Hyperlipidemia Mother   . Hypertension Mother   . COPD Father   . Peripheral vascular disease Father   . Diabetes Brother   . Heart disease Brother   . Hypertension Brother   . Heart attack Brother   . Diabetes Daughter   . Colon cancer Neg Hx   . Prostate cancer Neg Hx     Review of Systems  Constitutional: Negative for chills and fever.  HENT: Negative for postnasal drip, sore throat, trouble swallowing and voice change.   Respiratory: Positive for cough (a little). Negative for shortness of breath and wheezing.   Cardiovascular: Negative for chest pain.  Gastrointestinal:       No gerd  Neurological: Negative for weakness and numbness.       Objective:   Vitals:   07/18/17 1023  BP: 130/68  Pulse: (!) 57  Resp: 16  Temp: 98.5 F (36.9 C)  SpO2: 94%   Wt Readings from Last 3 Encounters:  10/29/16 160 lb (72.6 kg)  07/08/16 146 lb (66.2 kg)  07/06/16 146 lb 12.8 oz (66.6 kg)   There is no height or weight on file to calculate BMI.   Physical Exam    Constitutional: Appears well-developed and well-nourished. No distress.  HENT:  Head: Normocephalic and atraumatic.  Neck: Neck supple. No tracheal deviation present. No thyromegaly present.  No cervical lymphadenopathy Cardiovascular: Normal rate, regular rhythm and normal heart sounds.   No murmur heard. No carotid bruit .  2+ bilateral radial pulses Pulmonary/Chest: Effort normal and breath sounds normal. No respiratory distress. No has no wheezes. No rales.  Neurological: Normal strength bilateral hands Skin: Skin is warm and dry. Not diaphoretic. No erythema.  Skin and lower bilateral arms warm to touch Psychiatric: Normal mood and affect. Behavior is normal.       Assessment & Plan:    See Problem List for Assessment and Plan of chronic medical problems.

## 2017-07-18 ENCOUNTER — Encounter: Payer: Self-pay | Admitting: Internal Medicine

## 2017-07-18 ENCOUNTER — Ambulatory Visit (INDEPENDENT_AMBULATORY_CARE_PROVIDER_SITE_OTHER): Payer: Medicare Other | Admitting: Internal Medicine

## 2017-07-18 VITALS — BP 130/68 | HR 57 | Temp 98.5°F | Resp 16

## 2017-07-18 DIAGNOSIS — E1159 Type 2 diabetes mellitus with other circulatory complications: Secondary | ICD-10-CM | POA: Diagnosis not present

## 2017-07-18 DIAGNOSIS — R231 Pallor: Secondary | ICD-10-CM | POA: Diagnosis not present

## 2017-07-18 DIAGNOSIS — I255 Ischemic cardiomyopathy: Secondary | ICD-10-CM | POA: Diagnosis not present

## 2017-07-18 DIAGNOSIS — Z794 Long term (current) use of insulin: Secondary | ICD-10-CM

## 2017-07-18 DIAGNOSIS — E7849 Other hyperlipidemia: Secondary | ICD-10-CM | POA: Diagnosis not present

## 2017-07-18 MED ORDER — ROSUVASTATIN CALCIUM 20 MG PO TABS
20.0000 mg | ORAL_TABLET | Freq: Every day | ORAL | 3 refills | Status: DC
Start: 2017-07-18 — End: 2018-04-13

## 2017-07-18 NOTE — Telephone Encounter (Signed)
Follow Up: ° ° ° °Returning your call from this morning. °

## 2017-07-18 NOTE — Telephone Encounter (Signed)
Spoke with the pts wife and informed her of Dr [employer] recommendations. Wife states that she is taking the pt in today to see Dr Quay Burow PCP, for ongoing complaints.  Wife states she will hold off on the atorvastatin, until she talks to Dr Quay Burow about this today.  Wife states she will call us back to report an update from today's visit with the pts PCP.  Informed the pts wife that is acceptable.  Wife verbalized understanding and agrees with this plan.

## 2017-07-18 NOTE — Assessment & Plan Note (Signed)
He is fairly constant cold feeling and cool to touch bilateral lower upper extremities from his elbows down.  This started approximately 3 months ago for no reason Putting extra layers on sometimes helps a little He currently denies feeling cold and his skin feels warm to touch No weakness or change in sensation on exam Radial pulses bilaterally 2+, so I doubt a vascular issue We will check his TSH with his next blood draw, but his whole body is not cold, just his lower arms He did discuss this with cardiology We can consider him seeing vascular again, but given his strong pulses I do not think that is the issue

## 2017-07-18 NOTE — Assessment & Plan Note (Signed)
Sugars are well controlled at home Will order A1c to be done with next blood work Continue current medication at current doses

## 2017-07-18 NOTE — Patient Instructions (Signed)
Start taking generic crestor for your cholesterol.    If you develop any pain in your stumps call me or your cardiologist.    Have blood work done in February.  This is fasting blood work.

## 2017-07-18 NOTE — Telephone Encounter (Signed)
Pts wife called to report to Dr Meda Coffee that the pt went to see his PCP today, and she agreed with Dr Meda Coffee that atorvastatin could be causing his joints to ache.  Per the wife, PCP states she does not feel its a circulation issue, for he had good pulses and extremities were warm-to- touch.  Wife states that [name] d/c'ed his atorvastatin and switched him to Crestor, and he will follow-up with her in Feb with repeat labs.  Informed the pts wife that I will update Dr Meda Coffee on this.  Wife verbalized understanding and agrees with this plan.

## 2017-07-18 NOTE — Assessment & Plan Note (Signed)
Did not tolerate atorvastatin-it caused severe stump pain bilaterally Discussed with him that he should be on a statin Will start Crestor 20 mg daily.  Discussed this could have the same side effects and that if he does experience them we will need to stop the medication and discuss other options CMP, lipid panel in about 6 weeks

## 2017-09-15 ENCOUNTER — Other Ambulatory Visit: Payer: Self-pay | Admitting: Cardiology

## 2017-09-15 DIAGNOSIS — I2510 Atherosclerotic heart disease of native coronary artery without angina pectoris: Secondary | ICD-10-CM

## 2017-09-15 DIAGNOSIS — I739 Peripheral vascular disease, unspecified: Secondary | ICD-10-CM

## 2017-09-15 DIAGNOSIS — I5042 Chronic combined systolic (congestive) and diastolic (congestive) heart failure: Secondary | ICD-10-CM

## 2017-09-15 DIAGNOSIS — I5022 Chronic systolic (congestive) heart failure: Secondary | ICD-10-CM

## 2017-09-18 ENCOUNTER — Ambulatory Visit: Payer: Medicare Other | Admitting: Internal Medicine

## 2017-10-15 ENCOUNTER — Ambulatory Visit: Payer: Medicare Other | Admitting: Internal Medicine

## 2017-10-26 NOTE — Progress Notes (Deleted)
Subjective:    Patient ID: Jaime [name], male    DOB: 1941/02/24, 77 y.o.   MRN: [id number]  HPI The patient is here for follow up.  Diabetes: He is taking his medication daily as prescribed. He is compliant with a diabetic diet. He is exercising regularly. He monitors his sugars and they have been running XXX. He checks his feet daily and denies foot lesions. He is up-to-date with an ophthalmology examination.   Hyperlipidemia: He is taking his medication daily. He is compliant with a low fat/cholesterol diet. He is exercising regularly. He denies myalgias.   GERD:  He is taking his medication daily as prescribed.  He denies any GERD symptoms and feels his GERD is well controlled.    Medications and allergies reviewed with patient and updated if appropriate.  Patient Active Problem List   Diagnosis Date Noted  . Cool skin 07/18/2017  . Hematuria 04/29/2017  . GERD (gastroesophageal reflux disease) 04/10/2016  . Coronary artery disease involving native coronary artery of native heart without angina pectoris 12/28/2015  . PAD (peripheral artery disease) ([place]) 12/28/2015  . Bradycardia 08/14/2015  . Chronic combined systolic and diastolic CHF (congestive heart failure) ([place]) 08/14/2015  . Phantom limb pain ([place]) 12/08/2014  . Syncope 11/01/2014  . Multiple sclerosis, secondary progressive ([place]) 06/10/2014  . Paraparesis of both lower limbs ([place]) 05/31/2014  . Scoliosis, thoracogenic, acquired 03/15/2014  . Mild cognitive impairment 02/11/2014  . PVD (peripheral vascular disease) ([place]) 08/13/2013  . S/P AKA (above knee amputation) bilateral ([place]) 08/03/2012  . PAC (premature atrial contraction)   . Peripheral vascular disease, unspecified ([place]) 07/03/2012  . 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 Atherosclerosis of native arteries of the extremities with ulceration(440.23) 04/17/2012  . Ejection fraction   . Cardiomyopathy, ischemic   .  Pulmonary hypertension ([place])   . Urinary retention   . Hemiparesis ([place])   . Essential hypertension   . Urinary tract infection after immobility, UCX MRSA 03/26/2012  . Carotid artery disease ([place])   . Hyperlipidemia 07/31/2007  . BPH (benign prostatic hyperplasia) 04/15/2007  . DMII (diabetes mellitus, type 2) ([place]) 12/18/2006    Current Outpatient Medications on File Prior to Visit  Medication Sig Dispense Refill  . albuterol (PROVENTIL HFA;VENTOLIN HFA) 108 (90 Base) MCG/ACT inhaler Inhale 2 puffs into the lungs every 6 (six) hours as needed for wheezing or shortness of breath. 1 Inhaler 2  . aspirin EC 81 MG tablet Take 1 tablet (81 mg total) by mouth daily. 90 tablet 3  . azelastine (ASTELIN) 0.1 % nasal spray Place 2 sprays into both nostrils at bedtime as needed for rhinitis. Use in each nostril as directed 30 mL 3  . baclofen (LIORESAL) 10 MG tablet Take 0.5 tablets (5 mg total) by mouth 2 (two) times daily. 30 tablet 6  . Cholecalciferol (VITAMIN D) 2000 units CAPS Take 1 capsule by mouth daily.     . diclofenac sodium (VOLTAREN) 1 % GEL Apply 1 application topically 2 (two) times daily as needed (pain). For pain    . furosemide (LASIX) 20 MG tablet TAKE 1 TABLET(20 MG) BY MOUTH DAILY 90 tablet 1  . glucagon (GLUCAGEN) 1 MG SOLR injection Inject 1 mg into the vein once as needed for low blood sugar. Reported on 11/16/2015    . insulin aspart protamine - aspart (NOVOLOG 70/30 MIX) (70-30) 100 UNIT/ML FlexPen Inject 0.05 mLs (5 Units total) into the skin 2 (two) times daily.  15 mL 5  . Insulin Pen Needle (BD PEN NEEDLE NANO U/F) 32G X 4 MM MISC Use twice a day to administer Novolog 70/30 insulin 60 each 5  . lisinopril (PRINIVIL,ZESTRIL) 10 MG tablet TAKE 1 TABLET BY MOUTH EVERY DAY 30 tablet 5  . Multiple Vitamins-Minerals (MULTIVITAMIN WITH MINERALS) tablet Take 1 tablet by mouth daily.    . pantoprazole (PROTONIX) 40 MG tablet Take 1 tablet (40 mg total) by mouth daily with lunch.  90 tablet 1  . pregabalin (LYRICA) 150 MG capsule Take 1 capsule in morning and 2 capsules at night. 90 capsule 5  . rosuvastatin (CRESTOR) 20 MG tablet Take 1 tablet (20 mg total) by mouth daily. 30 tablet 3  . traMADol (ULTRAM) 50 MG tablet Take 1 tablet (50 mg total) by mouth every 4 (four) hours as needed. 120 tablet 5   No current facility-administered medications on file prior to visit.     Past Medical History:  Diagnosis Date  . Atherosclerotic PVD with ulceration ([place])    left foot  . CAD (coronary artery disease) CARDIOLOGIST- DR KATZ-- VISIT 06-05-2011 IN [facility]   non-STEMI, 2007.Marland Kitchenoccluded circumflex.. Taxus stent placed...residual 80% LAD...50% RCA  . Cardiomyopathy, ischemic    EF 45% per ECHO 2008  //   EF 25%, echo, August, 2013 //  Echo (8/15):  Mild LVH, EF 30-35%, ant-lat and lat AK, inf HK, Gr 1 DD, mild MR, mild LAE  . Carotid artery disease ([place])    a.  Doppler, February, 2012, 0-39% bilateral,Mild smooth plaque;  b.  Carotid US (8/15):  Bilateral 1-39% ICA >>> F/u 2 years  . Chronic indwelling Foley catheter   . Constipation   . Dementia   . Diabetes mellitus    INSULIN-DEPENDent  . Fatigue SEVERE  . H/O pleural effusion 2008   POST THORACENTESIS  . History of colon polyps PRECANCEROUS  . Hyperlipidemia   . Hypertension   . Impotence   . Increased prostate specific antigen (PSA) velocity   . Insomnia   . Lung nodule    resolved 11-2006 CT Chest  . Multiple sclerosis ([place]) Avinger -- LAST VISIT 11-20-2010  NOTE W/ CHART  . PAC (premature atrial contraction)    December, 2013  . Pulmonary hypertension ([place])    moderate ECHO Jan 2008  . Scoliosis associated with other condition   . Sleep apnea   . Systolic heart failure   . TIA (transient ischemic attack)   . Tobacco abuse    quit   . Urinary retention    dx ~ 2-12, like from [place], now with a catheter, saw urology  . Urinary tract infection    hx of    Past Surgical  History:  Procedure Laterality Date  . ABDOMINAL ANGIOGRAM  12/17/2011   Procedure: ABDOMINAL ANGIOGRAM;  Surgeon: Serafina Mitchell, MD;  Location: [facility] CATH LAB;  Service: Cardiovascular;;  . ABDOMINAL AORTAGRAM N/A 08/19/2013   Procedure: ABDOMINAL Maxcine Ham;  Surgeon: Conrad Helena, MD;  Location: [facility] CATH LAB;  Service: Cardiovascular;  Laterality: N/A;  . AMPUTATION  03/17/2012   Procedure: AMPUTATION ABOVE KNEE;  Surgeon: Conrad Adena, MD;  Location: [place];  Service: Vascular;  Laterality: Left;  . AMPUTATION  06/03/2012   Procedure: AMPUTATION ABOVE KNEE;  Surgeon: Conrad Selden, MD;  Location: [place];  Service: Vascular;  Laterality: Right;  . CHOLECYSTECTOMY N/A 10/25/2014   Procedure: LAPAROSCOPIC CHOLECYSTECTOMY ;  Surgeon: [name]  [name], MD;  Location: [place];  Service: General;  Laterality: N/A;  . CORONARY ANGIOPLASTY WITH STENT PLACEMENT  05-01-2006   OCCLUDED CIRCUMFLEX -- TAXUS STENT PLACMENT  AND RESIDUAL 80% LAD,  50% RCA  . CYSTOSCOPY  07/30/2011   Procedure: CYSTOSCOPY;  Surgeon: Hanley Ben, MD;  Location[facility];  Service: Urology;  Laterality: N/A;  . ERCP N/A 08/25/2014   Procedure: ENDOSCOPIC RETROGRADE CHOLANGIOPANCREATOGRAPHY (ERCP);  Surgeon: Ladene Artist, MD;  Location: [facility] ENDOSCOPY;  Service: Endoscopy;  Laterality: N/A;  . FEMORAL-POPLITEAL BYPASS GRAFT  03/11/2012   Procedure: BYPASS GRAFT FEMORAL-POPLITEAL ARTERY;  Surgeon: Conrad Berlin, MD;  Location: [place];  Service: Vascular;  Laterality: Left;  embolectomy left lower leg  . FEMORAL-TIBIAL BYPASS GRAFT  03/11/2012   Procedure: BYPASS GRAFT FEMORAL-TIBIAL ARTERY;  Surgeon: Conrad [place], MD;  Location: [facility] OR;  Service: Vascular;  Laterality: Left;  Left Femoral -Tibial trunk bypass, Endarterectomy of Tibial- Peroneal trunk with vein angioplasty.  Marland Kitchen HERNIA REPAIR  1990   (R)  . INTRAOPERATIVE ARTERIOGRAM  03/11/2012   Procedure: INTRA OPERATIVE ARTERIOGRAM;  Surgeon: Conrad Moultrie, MD;  Location: [place];  Service: Vascular;  Laterality: Left;  . LOWER EXTREMITY ANGIOGRAM Bilateral 12/17/2011   Procedure: LOWER EXTREMITY ANGIOGRAM;  Surgeon: Serafina Mitchell, MD;  Location: [facility] CATH LAB;  Service: Cardiovascular;  Laterality: Bilateral;  bil lower extrem angio  . THORACENTESIS  2008   PLEURAL EFFUSION  . TRANSURETHRAL RESECTION OF PROSTATE  07/30/2011   Procedure: TRANSURETHRAL RESECTION OF THE PROSTATE (TURP);  Surgeon: Hanley Ben, MD;  Location: [facility];  Service: Urology;  Laterality: N/A;    Social History   Socioeconomic History  . Marital status: Married    Spouse name: Not on file  . Number of children: 2  . Years of education: Not on file  . Highest education level: Not on file  Occupational History  . Occupation: disable    Employer: RETIRED  Social Needs  . Financial resource strain: Not on file  . Food insecurity:    Worry: Not on file    Inability: Not on file  . Transportation needs:    Medical: Not on file    Non-medical: Not on file  Tobacco Use  . Smoking status: Former Smoker    Years: 50.00    Last attempt to quit: 07/22/2012    Years since quitting: 5.2  . Smokeless tobacco: Current User  . Tobacco comment: pt states that he is using E-cigs  Substance and Sexual Activity  . Alcohol use: No    Alcohol/week: 0.0 oz  . Drug use: No  . Sexual activity: Never    Comment: electronic cigarettes no nicotene  Lifestyle  . Physical activity:    Days per week: Not on file    Minutes per session: Not on file  . Stress: Not on file  Relationships  . Social connections:    Talks on phone: Not on file    Gets together: Not on file    Attends religious service: Not on file    Active member of club or organization: Not on file    Attends meetings of clubs or organizations: Not on file    Relationship status: Not on file  Other Topics Concern  . Not on file  Social History Narrative   Lives w/ wife    Family History  Problem Relation  Age of Onset  . Heart attack Mother 10  .  Heart disease Mother   . Stroke Mother   . Hyperlipidemia Mother   . Hypertension Mother   . COPD Father   . Peripheral vascular disease Father   . Diabetes Brother   . Heart disease Brother   . Hypertension Brother   . Heart attack Brother   . Diabetes Daughter   . Colon cancer Neg Hx   . Prostate cancer Neg Hx     Review of Systems     Objective:  There were no vitals filed for this visit. BP Readings from Last 3 Encounters:  07/18/17 130/68  06/10/17 122/84  04/29/17 100/60   Wt Readings from Last 3 Encounters:  10/29/16 160 lb (72.6 kg)  07/08/16 146 lb (66.2 kg)  07/06/16 146 lb 12.8 oz (66.6 kg)   There is no height or weight on file to calculate BMI.   Physical Exam    Constitutional: Appears well-developed and well-nourished. No distress.  HENT:  Head: Normocephalic and atraumatic.  Neck: Neck supple. No tracheal deviation present. No thyromegaly present.  No cervical lymphadenopathy Cardiovascular: Normal rate, regular rhythm and normal heart sounds.   No murmur heard. No carotid bruit .  No edema Pulmonary/Chest: Effort normal and breath sounds normal. No respiratory distress. No has no wheezes. No rales.  Skin: Skin is warm and dry. Not diaphoretic.  Psychiatric: Normal mood and affect. Behavior is normal.      Assessment & Plan:    See Problem List for Assessment and Plan of chronic medical problems.

## 2017-10-27 ENCOUNTER — Emergency Department (HOSPITAL_COMMUNITY): Payer: Medicare Other

## 2017-10-27 ENCOUNTER — Inpatient Hospital Stay (HOSPITAL_COMMUNITY)
Admission: EM | Admit: 2017-10-27 | Discharge: 2017-11-01 | DRG: 871 | Disposition: A | Discharge status: Home Health | Payer: Medicare Other | Attending: Internal Medicine | Admitting: Internal Medicine

## 2017-10-27 ENCOUNTER — Encounter (HOSPITAL_COMMUNITY): Payer: Self-pay

## 2017-10-27 DIAGNOSIS — I255 Ischemic cardiomyopathy: Secondary | ICD-10-CM | POA: Diagnosis present

## 2017-10-27 DIAGNOSIS — E11649 Type 2 diabetes mellitus with hypoglycemia without coma: Secondary | ICD-10-CM | POA: Diagnosis present

## 2017-10-27 DIAGNOSIS — I272 Pulmonary hypertension, unspecified: Secondary | ICD-10-CM | POA: Diagnosis present

## 2017-10-27 DIAGNOSIS — R7989 Other specified abnormal findings of blood chemistry: Secondary | ICD-10-CM | POA: Diagnosis not present

## 2017-10-27 DIAGNOSIS — R001 Bradycardia, unspecified: Secondary | ICD-10-CM | POA: Diagnosis not present

## 2017-10-27 DIAGNOSIS — I5042 Chronic combined systolic (congestive) and diastolic (congestive) heart failure: Secondary | ICD-10-CM | POA: Diagnosis present

## 2017-10-27 DIAGNOSIS — Z89612 Acquired absence of left leg above knee: Secondary | ICD-10-CM

## 2017-10-27 DIAGNOSIS — Z794 Long term (current) use of insulin: Secondary | ICD-10-CM

## 2017-10-27 DIAGNOSIS — B961 Klebsiella pneumoniae [K. pneumoniae] as the cause of diseases classified elsewhere: Secondary | ICD-10-CM | POA: Diagnosis present

## 2017-10-27 DIAGNOSIS — I4510 Unspecified right bundle-branch block: Secondary | ICD-10-CM | POA: Diagnosis not present

## 2017-10-27 DIAGNOSIS — T426X5A Adverse effect of other antiepileptic and sedative-hypnotic drugs, initial encounter: Secondary | ICD-10-CM | POA: Diagnosis present

## 2017-10-27 DIAGNOSIS — Z955 Presence of coronary angioplasty implant and graft: Secondary | ICD-10-CM

## 2017-10-27 DIAGNOSIS — R740 Nonspecific elevation of levels of transaminase and lactic acid dehydrogenase [LDH]: Secondary | ICD-10-CM | POA: Diagnosis not present

## 2017-10-27 DIAGNOSIS — R570 Cardiogenic shock: Secondary | ICD-10-CM | POA: Diagnosis present

## 2017-10-27 DIAGNOSIS — G35 Multiple sclerosis: Secondary | ICD-10-CM | POA: Diagnosis present

## 2017-10-27 DIAGNOSIS — E878 Other disorders of electrolyte and fluid balance, not elsewhere classified: Secondary | ICD-10-CM | POA: Diagnosis not present

## 2017-10-27 DIAGNOSIS — Z91030 Bee allergy status: Secondary | ICD-10-CM

## 2017-10-27 DIAGNOSIS — A4159 Other Gram-negative sepsis: Secondary | ICD-10-CM | POA: Diagnosis present

## 2017-10-27 DIAGNOSIS — I2510 Atherosclerotic heart disease of native coronary artery without angina pectoris: Secondary | ICD-10-CM | POA: Diagnosis present

## 2017-10-27 DIAGNOSIS — D6959 Other secondary thrombocytopenia: Secondary | ICD-10-CM | POA: Diagnosis present

## 2017-10-27 DIAGNOSIS — E162 Hypoglycemia, unspecified: Secondary | ICD-10-CM | POA: Diagnosis not present

## 2017-10-27 DIAGNOSIS — E1151 Type 2 diabetes mellitus with diabetic peripheral angiopathy without gangrene: Secondary | ICD-10-CM | POA: Diagnosis present

## 2017-10-27 DIAGNOSIS — Z9049 Acquired absence of other specified parts of digestive tract: Secondary | ICD-10-CM | POA: Diagnosis not present

## 2017-10-27 DIAGNOSIS — I110 Hypertensive heart disease with heart failure: Secondary | ICD-10-CM | POA: Diagnosis present

## 2017-10-27 DIAGNOSIS — G92 Toxic encephalopathy: Secondary | ICD-10-CM | POA: Diagnosis present

## 2017-10-27 DIAGNOSIS — R778 Other specified abnormalities of plasma proteins: Secondary | ICD-10-CM

## 2017-10-27 DIAGNOSIS — E785 Hyperlipidemia, unspecified: Secondary | ICD-10-CM | POA: Diagnosis present

## 2017-10-27 DIAGNOSIS — A419 Sepsis, unspecified organism: Secondary | ICD-10-CM

## 2017-10-27 DIAGNOSIS — I5022 Chronic systolic (congestive) heart failure: Secondary | ICD-10-CM

## 2017-10-27 DIAGNOSIS — Z89611 Acquired absence of right leg above knee: Secondary | ICD-10-CM

## 2017-10-27 DIAGNOSIS — R4182 Altered mental status, unspecified: Secondary | ICD-10-CM | POA: Diagnosis not present

## 2017-10-27 DIAGNOSIS — Z7982 Long term (current) use of aspirin: Secondary | ICD-10-CM

## 2017-10-27 DIAGNOSIS — R0602 Shortness of breath: Secondary | ICD-10-CM | POA: Diagnosis not present

## 2017-10-27 DIAGNOSIS — Z823 Family history of stroke: Secondary | ICD-10-CM

## 2017-10-27 DIAGNOSIS — I739 Peripheral vascular disease, unspecified: Secondary | ICD-10-CM

## 2017-10-27 DIAGNOSIS — I252 Old myocardial infarction: Secondary | ICD-10-CM

## 2017-10-27 DIAGNOSIS — R7881 Bacteremia: Secondary | ICD-10-CM

## 2017-10-27 DIAGNOSIS — Z888 Allergy status to other drugs, medicaments and biological substances status: Secondary | ICD-10-CM

## 2017-10-27 DIAGNOSIS — T428X5A Adverse effect of antiparkinsonism drugs and other central muscle-tone depressants, initial encounter: Secondary | ICD-10-CM | POA: Diagnosis present

## 2017-10-27 DIAGNOSIS — M419 Scoliosis, unspecified: Secondary | ICD-10-CM | POA: Diagnosis present

## 2017-10-27 DIAGNOSIS — I471 Supraventricular tachycardia: Secondary | ICD-10-CM | POA: Diagnosis not present

## 2017-10-27 DIAGNOSIS — Z8673 Personal history of transient ischemic attack (TIA), and cerebral infarction without residual deficits: Secondary | ICD-10-CM

## 2017-10-27 DIAGNOSIS — N390 Urinary tract infection, site not specified: Secondary | ICD-10-CM | POA: Diagnosis present

## 2017-10-27 DIAGNOSIS — Z887 Allergy status to serum and vaccine status: Secondary | ICD-10-CM

## 2017-10-27 DIAGNOSIS — I361 Nonrheumatic tricuspid (valve) insufficiency: Secondary | ICD-10-CM | POA: Diagnosis not present

## 2017-10-27 DIAGNOSIS — E8729 Other acidosis: Secondary | ICD-10-CM

## 2017-10-27 DIAGNOSIS — R031 Nonspecific low blood-pressure reading: Secondary | ICD-10-CM | POA: Diagnosis not present

## 2017-10-27 DIAGNOSIS — Z79899 Other long term (current) drug therapy: Secondary | ICD-10-CM

## 2017-10-27 DIAGNOSIS — R6521 Severe sepsis with septic shock: Secondary | ICD-10-CM | POA: Diagnosis present

## 2017-10-27 DIAGNOSIS — F0390 Unspecified dementia without behavioral disturbance: Secondary | ICD-10-CM | POA: Diagnosis present

## 2017-10-27 DIAGNOSIS — G546 Phantom limb syndrome with pain: Secondary | ICD-10-CM | POA: Diagnosis present

## 2017-10-27 DIAGNOSIS — Z86010 Personal history of colonic polyps: Secondary | ICD-10-CM

## 2017-10-27 DIAGNOSIS — Z833 Family history of diabetes mellitus: Secondary | ICD-10-CM

## 2017-10-27 DIAGNOSIS — N179 Acute kidney failure, unspecified: Secondary | ICD-10-CM | POA: Diagnosis present

## 2017-10-27 DIAGNOSIS — G4700 Insomnia, unspecified: Secondary | ICD-10-CM | POA: Diagnosis present

## 2017-10-27 DIAGNOSIS — I25118 Atherosclerotic heart disease of native coronary artery with other forms of angina pectoris: Secondary | ICD-10-CM | POA: Diagnosis not present

## 2017-10-27 DIAGNOSIS — G4733 Obstructive sleep apnea (adult) (pediatric): Secondary | ICD-10-CM | POA: Diagnosis present

## 2017-10-27 DIAGNOSIS — E872 Acidosis: Secondary | ICD-10-CM | POA: Diagnosis present

## 2017-10-27 DIAGNOSIS — Z8249 Family history of ischemic heart disease and other diseases of the circulatory system: Secondary | ICD-10-CM

## 2017-10-27 DIAGNOSIS — I441 Atrioventricular block, second degree: Secondary | ICD-10-CM | POA: Diagnosis present

## 2017-10-27 DIAGNOSIS — Z8349 Family history of other endocrine, nutritional and metabolic diseases: Secondary | ICD-10-CM

## 2017-10-27 DIAGNOSIS — Z87891 Personal history of nicotine dependence: Secondary | ICD-10-CM

## 2017-10-27 DIAGNOSIS — I454 Nonspecific intraventricular block: Secondary | ICD-10-CM | POA: Diagnosis present

## 2017-10-27 LAB — COMPREHENSIVE METABOLIC PANEL
ALBUMIN: 2.7 g/dL — AB (ref 3.5–5.0)
ALT: 312 U/L — ABNORMAL HIGH (ref 17–63)
AST: 274 U/L — AB (ref 15–41)
Alkaline Phosphatase: 148 U/L — ABNORMAL HIGH (ref 38–126)
Anion gap: 13 (ref 5–15)
BUN: 21 mg/dL — AB (ref 6–20)
CHLORIDE: 105 mmol/L (ref 101–111)
CO2: 26 mmol/L (ref 22–32)
Calcium: 7.9 mg/dL — ABNORMAL LOW (ref 8.9–10.3)
Creatinine, Ser: 1.22 mg/dL (ref 0.61–1.24)
GFR calc Af Amer: >60 mL/min (ref >60)
GFR calc non Af Amer: 56 mL/min — ABNORMAL LOW (ref >60)
GLUCOSE: 69 mg/dL (ref 65–99)
Potassium: 2.8 mmol/L — ABNORMAL LOW (ref 3.5–5.1)
SODIUM: 144 mmol/L (ref 135–145)
Total Bilirubin: 4 mg/dL — ABNORMAL HIGH (ref 0.3–1.2)
Total Protein: 5.7 g/dL — ABNORMAL LOW (ref 6.5–8.1)

## 2017-10-27 LAB — GLUCOSE, CAPILLARY: GLUCOSE-CAPILLARY: 107 mg/dL — AB (ref 65–99)

## 2017-10-27 LAB — URINALYSIS, ROUTINE W REFLEX MICROSCOPIC
GLUCOSE, UA: NEGATIVE mg/dL
KETONES UR: 5 mg/dL — AB
NITRITE: NEGATIVE
PH: 7 (ref 5.0–8.0)
Protein, ur: 100 mg/dL — AB
Specific Gravity, Urine: 1.023 (ref 1.005–1.030)

## 2017-10-27 LAB — CBC
HCT: 36.9 % — ABNORMAL LOW (ref 39.0–52.0)
HEMOGLOBIN: 12.2 g/dL — AB (ref 13.0–17.0)
MCH: 25.6 pg — ABNORMAL LOW (ref 26.0–34.0)
MCHC: 33.1 g/dL (ref 30.0–36.0)
MCV: 77.5 fL — ABNORMAL LOW (ref 78.0–100.0)
PLATELETS: 140 10*3/uL — AB (ref 150–400)
RBC: 4.76 MIL/uL (ref 4.22–5.81)
RDW: 17 % — ABNORMAL HIGH (ref 11.5–15.5)
WBC: 14.6 10*3/uL — AB (ref 4.0–10.5)

## 2017-10-27 LAB — CBG MONITORING, ED
GLUCOSE-CAPILLARY: 78 mg/dL (ref 65–99)
Glucose-Capillary: 68 mg/dL (ref 65–99)
Glucose-Capillary: 207 mg/dL — ABNORMAL HIGH (ref 65–99)
Glucose-Capillary: 96 mg/dL (ref 65–99)

## 2017-10-27 LAB — MAGNESIUM: Magnesium: 0.8 mg/dL — CL (ref 1.7–2.4)

## 2017-10-27 LAB — I-STAT CG4 LACTIC ACID, ED: Lactic Acid, Venous: 2.34 mmol/L (ref 0.5–1.9)

## 2017-10-27 LAB — PROCALCITONIN: Procalcitonin: 4.27 ng/mL

## 2017-10-27 LAB — TROPONIN I
Troponin I: 0.09 ng/mL (ref <0.03)
Troponin I: 0.1 ng/mL (ref <0.03)

## 2017-10-27 LAB — LACTIC ACID, PLASMA: Lactic Acid, Venous: 1.2 mmol/L (ref 0.5–1.9)

## 2017-10-27 MED ORDER — VANCOMYCIN HCL IN DEXTROSE 1-5 GM/200ML-% IV SOLN: 1000.0000 mg | Freq: Once | INTRAVENOUS | Status: DC

## 2017-10-27 MED ORDER — SODIUM CHLORIDE 0.9 % IV SOLN
INTRAVENOUS | Status: DC
  Administered 2017-10-27: 22:00:00 via INTRAVENOUS

## 2017-10-27 MED ORDER — NOREPINEPHRINE BITARTRATE 1 MG/ML IV SOLN
0.0000 ug/min | Freq: Once | INTRAVENOUS | Status: AC
  Administered 2017-10-27: 2 ug/min via INTRAVENOUS
  Filled 2017-10-27: qty 4

## 2017-10-27 MED ORDER — HEPARIN SODIUM (PORCINE) 5000 UNIT/ML IJ SOLN
5000.0000 [IU] | Freq: Three times a day (TID) | INTRAMUSCULAR | Status: DC
  Administered 2017-10-27 – 2017-10-29 (×4): 5000 [IU] via SUBCUTANEOUS
  Filled 2017-10-27 (×4): qty 1

## 2017-10-27 MED ORDER — SODIUM CHLORIDE 0.9 % IV BOLUS
1000.0000 mL | Freq: Once | INTRAVENOUS | Status: AC
  Administered 2017-10-27: 1000 mL via INTRAVENOUS

## 2017-10-27 MED ORDER — VANCOMYCIN HCL IN DEXTROSE 750-5 MG/150ML-% IV SOLN
750.0000 mg | Freq: Two times a day (BID) | INTRAVENOUS | Status: DC
  Filled 2017-10-27: qty 150

## 2017-10-27 MED ORDER — POTASSIUM CHLORIDE 10 MEQ/100ML IV SOLN
10.0000 meq | INTRAVENOUS | Status: AC
  Administered 2017-10-27 (×2): 10 meq via INTRAVENOUS
  Filled 2017-10-27 (×2): qty 100

## 2017-10-27 MED ORDER — SODIUM CHLORIDE 0.9 % IV BOLUS
500.0000 mL | Freq: Once | INTRAVENOUS | Status: AC
  Administered 2017-10-27: 500 mL via INTRAVENOUS

## 2017-10-27 MED ORDER — PIPERACILLIN-TAZOBACTAM 3.375 G IVPB 30 MIN
3.3750 g | Freq: Once | INTRAVENOUS | Status: AC
  Administered 2017-10-27: 3.375 g via INTRAVENOUS
  Filled 2017-10-27: qty 50

## 2017-10-27 MED ORDER — MAGNESIUM SULFATE 2 GM/50ML IV SOLN
2.0000 g | Freq: Once | INTRAVENOUS | Status: AC
  Administered 2017-10-27: 2 g via INTRAVENOUS
  Filled 2017-10-27: qty 50

## 2017-10-27 MED ORDER — NOREPINEPHRINE BITARTRATE 1 MG/ML IV SOLN
0.0000 ug/min | INTRAVENOUS | Status: DC
  Filled 2017-10-27: qty 4

## 2017-10-27 MED ORDER — INSULIN ASPART 100 UNIT/ML ~~LOC~~ SOLN: 0.0000 [IU] | SUBCUTANEOUS | Status: DC

## 2017-10-27 MED ORDER — POTASSIUM CHLORIDE CRYS ER 20 MEQ PO TBCR
40.0000 meq | EXTENDED_RELEASE_TABLET | Freq: Once | ORAL | Status: AC
  Administered 2017-10-27: 40 meq via ORAL
  Filled 2017-10-27: qty 2

## 2017-10-27 MED ORDER — SODIUM CHLORIDE 0.9 % IV SOLN: 250.0000 mL | INTRAVENOUS | Status: DC | PRN

## 2017-10-27 MED ORDER — PIPERACILLIN-TAZOBACTAM 3.375 G IVPB: 3.3750 g | Freq: Three times a day (TID) | INTRAVENOUS | Status: DC

## 2017-10-27 MED ORDER — DEXTROSE 50 % IV SOLN
1.0000 | Freq: Once | INTRAVENOUS | Status: AC
  Administered 2017-10-27: 50 mL via INTRAVENOUS
  Filled 2017-10-27: qty 50

## 2017-10-27 MED ORDER — VANCOMYCIN HCL 10 G IV SOLR
1500.0000 mg | Freq: Once | INTRAVENOUS | Status: AC
  Administered 2017-10-27: 1500 mg via INTRAVENOUS
  Filled 2017-10-27: qty 1500

## 2017-10-27 NOTE — ED Notes (Signed)
IV team at bedside 

## 2017-10-27 NOTE — ED Notes (Signed)
CBG 207 

## 2017-10-27 NOTE — Progress Notes (Signed)
eLink Physician-Brief Progress Note Patient Name: Jaime [name] DOB: [birth year] MRN: [id number]   Date of Service  10/27/2017  HPI/Events of Note  Hypotension - BP = 144/53 on Norepinephrine IV infusion. Norepinephrine IV infusion order expires.   eICU Interventions  Will order: 1. Norepinephrine IV infusion. Titrate to MAP > 65.     Intervention Category Major Interventions: Hypotension - evaluation and management  Romi Rathel [place] 10/27/2017, 10:48 PM

## 2017-10-27 NOTE — ED Notes (Signed)
Jaime [name] in main lab to add on magnesium

## 2017-10-27 NOTE — H&P (Addendum)
PULMONARY / CRITICAL CARE MEDICINE   Name: Jaime [name] MRN: [id number] DOB: 17-Dec-1940    ADMISSION DATE:  10/27/2017 CONSULTATION DATE:  10/27/17  REFERRING MD:  Rex Kras  CHIEF COMPLAINT:  AMS  HISTORY OF PRESENT ILLNESS:  Jaime [name] is a 77 y.o. male with PMH as outlined below. He was brought to [facility] ED 4/8 via EMS after being found unresponsive by wife.  On EMS arrival, BP was 60/40.  Pt was started on epinephrine infusion and transported to ED.  In ED, he remained hypotensive after fluids (2.5L total); therefore, was started on levophed.  He was found to have urosepsis.  CXR and abdominal US were negative (abd [place] ordered due to transaminitis).  Per pt and wife, on day prior to admit, pt had generalized weakness and "just didn't feel well".  He was clammy, had subjective fever, chills, myalgias.  Urine looked dark.  Pt had no PO intake all day.  Denied any chest pain, SOB, N/V/D.  No exposures to known sick contacts.  He was scheduled to see PCP tomorrow 4/9 for general check up.   PAST MEDICAL HISTORY :  He  has a past medical history of Atherosclerotic PVD with ulceration ([place]), CAD (coronary artery disease) (CARDIOLOGIST- DR KATZ-- VISIT 06-05-2011 IN [facility]), Cardiomyopathy, ischemic, Carotid artery disease ([place]), Chronic indwelling Foley catheter, Constipation, Dementia, Diabetes mellitus, Fatigue (SEVERE), H/O pleural effusion (2008), History of colon polyps (PRECANCEROUS), Hyperlipidemia, Hypertension, Impotence, Increased prostate specific antigen (PSA) velocity, Insomnia, Lung nodule, Multiple sclerosis ([place]) (DX 1993), PAC (premature atrial contraction), Pulmonary hypertension ([place]), Scoliosis associated with other condition, Sleep apnea, Systolic heart failure, TIA (transient ischemic attack), Tobacco abuse, Urinary retention, and Urinary tract infection.  PAST SURGICAL HISTORY: He  has a past surgical history that includes Thoracentesis (2008); Cystoscopy (07/30/2011);  Transurethral resection of prostate (07/30/2011); Femoral-tibial Bypass Graft (03/11/2012); Intraoperative arteriogram (03/11/2012); Femoral-popliteal Bypass Graft (03/11/2012); Amputation (03/17/2012); Coronary angioplasty with stent (05-01-2006); Amputation (06/03/2012); lower extremity angiogram (Bilateral, 12/17/2011); abdominal angiogram (12/17/2011); abdominal aortagram (N/A, 08/19/2013); ERCP (N/A, 08/25/2014); Cholecystectomy (N/A, 10/25/2014); and Hernia repair (1990).  Allergies  Allergen Reactions  . Bee Venom Anaphylaxis  . Beta Adrenergic Blockers Other (See Comments)    Bradycardia with carvedilol  . Carvedilol Other (See Comments)    Bradycardia with all Beta blockers   . Influenza Vaccines Other (See Comments)    Sick for months  . Atorvastatin     Severe pain in stumps    No current facility-administered medications on file prior to encounter.    Current Outpatient Medications on File Prior to Encounter  Medication Sig  . albuterol (PROVENTIL HFA;VENTOLIN HFA) 108 (90 Base) MCG/ACT inhaler Inhale 2 puffs into the lungs every 6 (six) hours as needed for wheezing or shortness of breath.  Marland Kitchen aspirin EC 81 MG tablet Take 1 tablet (81 mg total) by mouth daily.  Marland Kitchen azelastine (ASTELIN) 0.1 % nasal spray Place 2 sprays into both nostrils at bedtime as needed for rhinitis. Use in each nostril as directed  . baclofen (LIORESAL) 10 MG tablet Take 0.5 tablets (5 mg total) by mouth 2 (two) times daily.  . Cholecalciferol (VITAMIN D) 2000 units CAPS Take 1 capsule by mouth daily.   . diclofenac sodium (VOLTAREN) 1 % GEL Apply 1 application topically 2 (two) times daily as needed (pain). For pain  . furosemide (LASIX) 20 MG tablet TAKE 1 TABLET(20 MG) BY MOUTH DAILY  . glucagon (GLUCAGEN) 1 MG SOLR injection Inject 1 mg into the vein  once as needed for low blood sugar. Reported on 11/16/2015  . insulin aspart protamine - aspart (NOVOLOG 70/30 MIX) (70-30) 100 UNIT/ML FlexPen Inject 0.05 mLs (5 Units  total) into the skin 2 (two) times daily.  Marland Kitchen lisinopril (PRINIVIL,ZESTRIL) 10 MG tablet TAKE 1 TABLET BY MOUTH EVERY DAY (Patient taking differently: TAKE 10 mg TABLET BY MOUTH EVERY DAY)  . Multiple Vitamins-Minerals (MULTIVITAMIN WITH MINERALS) tablet Take 1 tablet by mouth daily.  . pantoprazole (PROTONIX) 40 MG tablet Take 1 tablet (40 mg total) by mouth daily with lunch.  . pregabalin (LYRICA) 150 MG capsule Take 1 capsule in morning and 2 capsules at night. (Patient taking differently: Take 150-300 mg by mouth 2 (two) times daily. Take 1 capsule in morning and 2 capsules at night.)  . rosuvastatin (CRESTOR) 20 MG tablet Take 1 tablet (20 mg total) by mouth daily.  . traMADol (ULTRAM) 50 MG tablet Take 1 tablet (50 mg total) by mouth every 4 (four) hours as needed.    FAMILY HISTORY:  His indicated that his mother is deceased. He indicated that his father is deceased. He indicated that the status of his brother is unknown. He indicated that his maternal grandmother is deceased. He indicated that his maternal grandfather is deceased. He indicated that his paternal grandmother is deceased. He indicated that his paternal grandfather is deceased. He indicated that the status of his daughter is unknown. He indicated that the status of his neg hx is unknown.   SOCIAL HISTORY: He  reports that he quit smoking about 5 years ago. He quit after 50.00 years of use. He uses smokeless tobacco. He reports that he does not drink alcohol or use drugs.  REVIEW OF SYSTEMS:   All negative; except for those that are bolded, which indicate positives.  Constitutional: weight loss, weight gain, night sweats, fevers, chills, fatigue, weakness.  HEENT: headaches, sore throat, sneezing, nasal congestion, post nasal drip, difficulty swallowing, tooth/dental problems, visual complaints, visual changes, ear aches. Neuro: difficulty with speech, weakness, numbness, ataxia. CV:  chest pain, orthopnea, PND, swelling in  lower extremities, dizziness, palpitations, syncope.  Resp: cough, hemoptysis, dyspnea, wheezing. GI: heartburn, indigestion, abdominal pain, nausea, vomiting, diarrhea, constipation, change in bowel habits, loss of appetite, hematemesis, melena, hematochezia.  GU: dysuria, change in color of urine, urgency or frequency, flank pain, hematuria. MSK: joint pain or swelling, decreased range of motion, myalgias. Psych: change in mood or affect, depression, anxiety, suicidal ideations, homicidal ideations. Skin: rash, itching, bruising.   SUBJECTIVE:  Denies any pain now.  HR varies from low 50's to high 60's.  VITAL SIGNS: BP (!) 104/52   Pulse 88   Temp (!) 101.5 F (38.6 C) (Rectal)   Resp 15   Wt 72.6 kg (160 lb)   SpO2 99%   BMI 22.32 kg/m   HEMODYNAMICS:    VENTILATOR SETTINGS:    INTAKE / OUTPUT: I/O last 3 completed shifts: In: 2050 [IV Piggyback:2050] Out: -    PHYSICAL EXAMINATION: General: Adult male, in NAD. Neuro: A&O x 3, non-focal.  HEENT: [place]/AT. EOMI, sclerae anicteric. Cardiovascular: Bradycardic, irregular, no M/R/G.  Lungs: Respirations even and unlabored.  CTA bilaterally, No W/R/R. Abdomen: BS x 4, soft, NT/ND.  Musculoskeletal: Bilateral BKA's.  Skin: Intact, warm, no rashes.  LABS:  BMET Recent Labs  Lab 10/27/17 1614  NA 144  K 2.8*  CL 105  CO2 26  BUN 21*  CREATININE 1.22  GLUCOSE 69    Electrolytes Recent Labs  Lab  10/27/17 1614  CALCIUM 7.9*    CBC Recent Labs  Lab 10/27/17 1614  WBC 14.6*  HGB 12.2*  HCT 36.9*  PLT 140*    Coag's No results for input(s): APTT, INR in the last 168 hours.  Sepsis Markers Recent Labs  Lab 10/27/17 1749  LATICACIDVEN 2.34*    ABG No results for input(s): PHART, PCO2ART, PO2ART in the last 168 hours.  Liver Enzymes Recent Labs  Lab 10/27/17 1614  AST 274*  ALT 312*  ALKPHOS 148*  BILITOT 4.0*  ALBUMIN 2.7*    Cardiac Enzymes Recent Labs  Lab 10/27/17 1614   TROPONINI 0.09*    Glucose Recent Labs  Lab 10/27/17 1606 10/27/17 1641 10/27/17 1809  GLUCAP 68 207* 96    Imaging US Abdomen Limited  Result Date: 10/27/2017 CLINICAL DATA:  Elevated liver function tests, febrile. History of cholecystectomy. EXAM: ULTRASOUND ABDOMEN LIMITED RIGHT UPPER QUADRANT COMPARISON:  CT abdomen and pelvis November 01, 2014 FINDINGS: Gallbladder: Status post cholecystectomy. No fluid collections within gallbladder fossa. Common bile duct: Diameter: 5 mm Stable anechoic 13 mm cyst LEFT lobe of the liver. Within normal limits in parenchymal echogenicity. Portal vein is patent on color Doppler imaging with normal direction of blood flow towards the liver. 16 mm anechoic cyst upper pole RIGHT kidney with acoustic enhancement. IMPRESSION: 1. No acute process in the RIGHT upper quadrant; status post cholecystectomy. Electronically Signed   By: Elon Alas M.D.   On: 10/27/2017 19:21   Dg Chest Port 1 View  Result Date: 10/27/2017 CLINICAL DATA:  Bradycardia.  Sepsis and fever. EXAM: PORTABLE CHEST 1 VIEW COMPARISON:  07/08/2016 FINDINGS: Normal heart size. No pleural effusion or edema. No airspace opacities identified. Review of the visualized osseous structures is unremarkable. IMPRESSION: 1. No acute cardiopulmonary abnormality. Electronically Signed   By: Kerby Moors M.D.   On: 10/27/2017 18:45     STUDIES:  Abd [place] 4/8 > no acute process. CXR 4/8 > no acute process.  CULTURES: Blood 4/8 >  Urine 4/8 >   ANTIBIOTICS: Vanc 4/8 >  Zosyn 4/8 >   SIGNIFICANT EVENTS: 4/8 > admit.  LINES/TUBES: None.  DISCUSSION: 77 y.o. male admitted 4/8 with urosepsis.  Required levophed due to persistent hypotension after 2500cc NS bolus.  ASSESSMENT / PLAN:  PULMONARY A: Hx OSA (does not use CPAP), lung nodule (resolved on CT chest 2008), PAH. Former smoker. P:   Continue tobacco cessation.  CARDIOVASCULAR A:  Septic shock - due to urosepsis.  A repeat  sepsis assessment has been performed.  Also likely some component of hypovolemia. Bradycardia - HR varies from low 50's to high 60's. Hx HTN, HLD, CAD, ICM, sCHF (Echo from Jan 2017 with EF 35%, G1DD). P:  Will switch levophed to dopamine. Might need CVL. Trend troponin, lactate. Assess echo. Continue preadmission ASA, rosuvastatin. Hold preadmission furosemide, lisinopril. Day team to please consult cardiology (he sees Dr Meda Coffee, last seen Dec 2018).  RENAL A:   Hypokalemia - received 2 runs K in ED. Hypomagnesemia. P:   Additional 40 mEQ K PO now. 2g Mag. NS @ 100. BMP, Mag, Phos in AM.  GASTROINTESTINAL A:   Transaminitis. GI prophylaxis. Nutrition. P:   Repeat LFT's in AM. Heart healthy diet.  HEMATOLOGIC A:   Thrombocytopenia. VTE Prophylaxis. P:  Monitor platelet counts. Heparin. CBC in AM.  INFECTIOUS A:   Septic shock - due to urosepsis.  A repeat sepsis assessment has been performed.  Also likely some component of  hypovolemia. P:   Abx as above (vanc / zosyn).  Follow cultures as above. PCT algorithm to limit abx exposure.  ENDOCRINE A:   Hypoglycemia - resolved. Hx DM. P:   SSI. Hold preadmission novolog.  NEUROLOGIC A:   Hx TIA, dementia, insomnia, MS. P:   Hold preadmission baclofen, pregabalin.  Family updated: Wife updated at bedside.  Interdisciplinary Family Meeting v Palliative Care Meeting:  Due by: 11/02/17.  CC time: 35 min.   Montey Hora, [facility] Pager: [phone]  or [phone] 10/27/2017, 7:48 PM

## 2017-10-27 NOTE — ED Provider Notes (Addendum)
[facility] EMERGENCY DEPARTMENT Provider Note   CSN: [id number] Arrival date & time: 10/27/17  1557     History   Chief Complaint Chief Complaint  Patient presents with  . Bradycardia    HPI Jaime [name] is a 77 y.o. male.  77yo M w/ PMH including CAD, PVD w/ B/l BKAs, IDDM, TIA, sCHF, dementia who p/w altered mental status. Wife states that he began running fevers yesterday.  She has noticed that his urine has been darker and more foul-smelling recently.  Today he has complained to her of generalized body aches.  She states that he has not had any cough, vomiting, or diarrhea.  This afternoon, she noted that he was less responsive and she called EMS.  In transport, EMS noted hypotension and they gave small fluid bolus and started an epinephrine drip.  They then noted some bradycardia and started pacing the patient. Blood glucose low on arrival to ED.  LEVEL 5 CAVEAT DUE TO DEMENTIA  The history is provided by the patient and the spouse.    Past Medical History:  Diagnosis Date  . Atherosclerotic PVD with ulceration ([place])    left foot  . CAD (coronary artery disease) CARDIOLOGIST- DR KATZ-- VISIT 06-05-2011 IN [facility]   non-STEMI, 2007.Marland Kitchenoccluded circumflex.. Taxus stent placed...residual 80% LAD...50% RCA  . Cardiomyopathy, ischemic    EF 45% per ECHO 2008  //   EF 25%, echo, August, 2013 //  Echo (8/15):  Mild LVH, EF 30-35%, ant-lat and lat AK, inf HK, Gr 1 DD, mild MR, mild LAE  . Carotid artery disease ([place])    a.  Doppler, February, 2012, 0-39% bilateral,Mild smooth plaque;  b.  Carotid US (8/15):  Bilateral 1-39% ICA >>> F/u 2 years  . Chronic indwelling Foley catheter   . Constipation   . Dementia   . Diabetes mellitus    INSULIN-DEPENDent  . Fatigue SEVERE  . H/O pleural effusion 2008   POST THORACENTESIS  . History of colon polyps PRECANCEROUS  . Hyperlipidemia   . Hypertension   . Impotence   . Increased prostate specific antigen (PSA)  velocity   . Insomnia   . Lung nodule    resolved 11-2006 CT Chest  . Multiple sclerosis ([place]) [place] -- LAST VISIT 11-20-2010  NOTE W/ CHART  . PAC (premature atrial contraction)    December, 2013  . Pulmonary hypertension ([place])    moderate ECHO Jan 2008  . Scoliosis associated with other condition   . Sleep apnea   . Systolic heart failure   . TIA (transient ischemic attack)   . Tobacco abuse    quit   . Urinary retention    dx ~ 2-12, like from [place], now with a catheter, saw urology  . Urinary tract infection    hx of    Patient Active Problem List   Diagnosis Date Noted  . Cool skin 07/18/2017  . Hematuria 04/29/2017  . GERD (gastroesophageal reflux disease) 04/10/2016  . Coronary artery disease involving native coronary artery of native heart without angina pectoris 12/28/2015  . PAD (peripheral artery disease) ([place]) 12/28/2015  . Bradycardia 08/14/2015  . Chronic combined systolic and diastolic CHF (congestive heart failure) ([place]) 08/14/2015  . Phantom limb pain ([place]) 12/08/2014  . Syncope 11/01/2014  . 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 Paraparesis of both lower limbs ([place]) 05/31/2014  . Scoliosis, thoracogenic, acquired 03/15/2014  . Mild cognitive impairment 02/11/2014  .  PVD (peripheral vascular disease) ([place]) 08/13/2013  . S/P AKA (above knee amputation) bilateral ([place]) 08/03/2012  . PAC (premature atrial contraction)   . Peripheral vascular disease, unspecified ([place]) 07/03/2012  . 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 Atherosclerosis of native arteries of the extremities with ulceration(440.23) 04/17/2012  . Ejection fraction   . Cardiomyopathy, ischemic   . Pulmonary hypertension ([place])   . Urinary retention   . Hemiparesis ([place])   . Essential hypertension   . Urinary tract infection after immobility, UCX MRSA 03/26/2012  . Carotid artery disease ([place])   .  Hyperlipidemia 07/31/2007  . BPH (benign prostatic hyperplasia) 04/15/2007  . DMII (diabetes mellitus, type 2) ([place]) 12/18/2006    Past Surgical History:  Procedure Laterality Date  . ABDOMINAL ANGIOGRAM  12/17/2011   Procedure: ABDOMINAL ANGIOGRAM;  Surgeon: Serafina Mitchell, MD;  Location: [facility] CATH LAB;  Service: Cardiovascular;;  . ABDOMINAL AORTAGRAM N/A 08/19/2013   Procedure: ABDOMINAL Maxcine Ham;  Surgeon: Conrad Friendship, MD;  Location: [facility] CATH LAB;  Service: Cardiovascular;  Laterality: N/A;  . AMPUTATION  03/17/2012   Procedure: AMPUTATION ABOVE KNEE;  Surgeon: Conrad Brooks, MD;  Location: [facility];  Service: Vascular;  Laterality: Left;  . AMPUTATION  06/03/2012   Procedure: AMPUTATION ABOVE KNEE;  Surgeon: Conrad Greenleaf, MD;  Location: [place];  Service: Vascular;  Laterality: Right;  . CHOLECYSTECTOMY N/A 10/25/2014   Procedure: LAPAROSCOPIC CHOLECYSTECTOMY ;  Surgeon: Coralie Keens, MD;  Location: [place];  Service: General;  Laterality: N/A;  . CORONARY ANGIOPLASTY WITH STENT PLACEMENT  05-01-2006   OCCLUDED CIRCUMFLEX -- TAXUS STENT PLACMENT  AND RESIDUAL 80% LAD,  50% RCA  . CYSTOSCOPY  07/30/2011   Procedure: CYSTOSCOPY;  Surgeon: Hanley Ben, MD;  Location: [facility];  Service: Urology;  Laterality: N/A;  . ERCP N/A 08/25/2014   Procedure: ENDOSCOPIC RETROGRADE CHOLANGIOPANCREATOGRAPHY (ERCP);  Surgeon: Ladene Artist, MD;  Location: [facility] ENDOSCOPY;  Service: Endoscopy;  Laterality: N/A;  . FEMORAL-POPLITEAL BYPASS GRAFT  03/11/2012   Procedure: BYPASS GRAFT FEMORAL-POPLITEAL ARTERY;  Surgeon: Conrad Benson, MD;  Location: [place];  Service: Vascular;  Laterality: Left;  embolectomy left lower leg  . FEMORAL-TIBIAL BYPASS GRAFT  03/11/2012   Procedure: BYPASS GRAFT FEMORAL-TIBIAL ARTERY;  Surgeon: Conrad Avery, MD;  Location: [facility] OR;  Service: Vascular;  Laterality: Left;  Left Femoral -Tibial trunk bypass, Endarterectomy of Tibial- Peroneal trunk with vein angioplasty.  Marland Kitchen  HERNIA REPAIR  1990   (R)  . INTRAOPERATIVE ARTERIOGRAM  03/11/2012   Procedure: INTRA OPERATIVE ARTERIOGRAM;  Surgeon: Conrad Viola, MD;  Location: [place];  Service: Vascular;  Laterality: Left;  . LOWER EXTREMITY ANGIOGRAM Bilateral 12/17/2011   Procedure: LOWER EXTREMITY ANGIOGRAM;  Surgeon: Serafina Mitchell, MD;  Location: [facility] CATH LAB;  Service: Cardiovascular;  Laterality: Bilateral;  bil lower extrem angio  . THORACENTESIS  2008   PLEURAL EFFUSION  . TRANSURETHRAL RESECTION OF PROSTATE  07/30/2011   Procedure: TRANSURETHRAL RESECTION OF THE PROSTATE (TURP);  Surgeon: Hanley Ben, MD;  Location: [facility];  Service: Urology;  Laterality: N/A;        Home Medications    Prior to Admission medications   Medication Sig Start Date End Date Taking? Authorizing Provider  albuterol (PROVENTIL HFA;VENTOLIN HFA) 108 (90 Base) MCG/ACT inhaler Inhale 2 puffs into the lungs every 6 (six) hours as needed for wheezing or shortness of breath. 12/28/15  Yes Dorothy Spark,  MD  aspirin EC 81 MG tablet Take 1 tablet (81 mg total) by mouth daily. 07/07/17  Yes Dorothy Spark, MD  azelastine (ASTELIN) 0.1 % nasal spray Place 2 sprays into both nostrils at bedtime as needed for rhinitis. Use in each nostril as directed 08/28/15  Yes [name], Alda Berthold, MD  baclofen (LIORESAL) 10 MG tablet Take 0.5 tablets (5 mg total) by mouth 2 (two) times daily. 12/25/16  Yes Jaffe, Adam R, DO  Cholecalciferol (VITAMIN D) 2000 units CAPS Take 1 capsule by mouth daily.    Yes [provider]  diclofenac sodium (VOLTAREN) 1 % GEL Apply 1 application topically 2 (two) times daily as needed (pain). For pain 03/09/12  Yes Kirsteins, Luanna Salk, MD  furosemide (LASIX) 20 MG tablet TAKE 1 TABLET(20 MG) BY MOUTH DAILY 09/16/17  Yes Dorothy Spark, MD  glucagon (GLUCAGEN) 1 MG SOLR injection Inject 1 mg into the vein once as needed for low blood sugar. Reported on 11/16/2015   Yes [provider]    insulin aspart protamine - aspart (NOVOLOG 70/30 MIX) (70-30) 100 UNIT/ML FlexPen Inject 0.05 mLs (5 Units total) into the skin 2 (two) times daily. 03/12/17  Yes Burns, Claudina Lick, MD  lisinopril (PRINIVIL,ZESTRIL) 10 MG tablet TAKE 1 TABLET BY MOUTH EVERY DAY Patient taking differently: TAKE 10 mg TABLET BY MOUTH EVERY DAY 05/26/17  Yes Burns, Claudina Lick, MD  Multiple Vitamins-Minerals (MULTIVITAMIN WITH MINERALS) tablet Take 1 tablet by mouth daily.   Yes [provider]  pantoprazole (PROTONIX) 40 MG tablet Take 1 tablet (40 mg total) by mouth daily with lunch. 10/29/16  Yes Burns, Claudina Lick, MD  pregabalin (LYRICA) 150 MG capsule Take 1 capsule in morning and 2 capsules at night. Patient taking differently: Take 150-300 mg by mouth 2 (two) times daily. Take 1 capsule in morning and 2 capsules at night. 06/10/17  Yes Jaffe, Adam R, DO  rosuvastatin (CRESTOR) 20 MG tablet Take 1 tablet (20 mg total) by mouth daily. 07/18/17  Yes Burns, Claudina Lick, MD  traMADol (ULTRAM) 50 MG tablet Take 1 tablet (50 mg total) by mouth every 4 (four) hours as needed. 06/10/17  Yes Pieter Partridge, DO    Family History Family History  Problem Relation Age of Onset  . Heart attack Mother 50  . Heart disease Mother   . Stroke Mother   . Hyperlipidemia Mother   . Hypertension Mother   . COPD Father   . Peripheral vascular disease Father   . Diabetes Brother   . Heart disease Brother   . Hypertension Brother   . Heart attack Brother   . Diabetes Daughter   . Colon cancer Neg Hx   . Prostate cancer Neg Hx     Social History Social History   Tobacco Use  . Smoking status: Former Smoker    Years: 50.00    Last attempt to quit: 07/22/2012    Years since quitting: 5.2  . Smokeless tobacco: Current User  . Tobacco comment: pt states that he is using E-cigs  Substance Use Topics  . Alcohol use: No    Alcohol/week: 0.0 oz  . Drug use: No     Allergies   Bee venom; Beta adrenergic blockers; Carvedilol;  Influenza vaccines; and Atorvastatin   Review of Systems Review of Systems  Unable to perform ROS: Dementia     Physical Exam Updated Vital Signs BP (!) 104/52   Pulse 88   Temp (!) 101.5 F (  38.6 C) (Rectal)   Resp 15   Wt 72.6 kg (160 lb)   SpO2 99%   BMI 22.32 kg/m   Physical Exam  Constitutional: He appears well-developed. No distress.  Chronically ill appearing, sleepy  HENT:  Head: Normocephalic and atraumatic.  Eyes: Pupils are equal, round, and reactive to light. Conjunctivae are normal.  Neck: Neck supple.  Cardiovascular: Normal rate, regular rhythm and normal heart sounds.  No murmur heard. Pulmonary/Chest: Effort normal and breath sounds normal.  Abdominal: Soft. Bowel sounds are normal. He exhibits no distension. There is no tenderness.  Musculoskeletal: He exhibits no edema.  B/l BKA  Neurological:  Altered but opens eyes to voice  Skin: Skin is warm and dry.  Nursing note and vitals reviewed.    ED Treatments / Results  Labs (all labs ordered are listed, but only abnormal results are displayed) Labs Reviewed  CBC - Abnormal; Notable for the following components:      Result Value   WBC 14.6 (*)    Hemoglobin 12.2 (*)    HCT 36.9 (*)    MCV 77.5 (*)    MCH 25.6 (*)    RDW 17.0 (*)    Platelets 140 (*)    All other components within normal limits  COMPREHENSIVE METABOLIC PANEL - Abnormal; Notable for the following components:   Potassium 2.8 (*)    BUN 21 (*)    Calcium 7.9 (*)    Total Protein 5.7 (*)    Albumin 2.7 (*)    AST 274 (*)    ALT 312 (*)    Alkaline Phosphatase 148 (*)    Total Bilirubin 4.0 (*)    GFR calc non Af Amer 56 (*)    All other components within normal limits  TROPONIN I - Abnormal; Notable for the following components:   Troponin I 0.09 (*)    All other components within normal limits  URINALYSIS, ROUTINE W REFLEX MICROSCOPIC - Abnormal; Notable for the following components:   Color, Urine AMBER (*)     APPearance CLOUDY (*)    Hgb urine dipstick SMALL (*)    Bilirubin Urine MODERATE (*)    Ketones, ur 5 (*)    Protein, ur 100 (*)    Leukocytes, UA MODERATE (*)    Bacteria, UA MANY (*)    Squamous Epithelial / LPF 0-5 (*)    All other components within normal limits  I-STAT CG4 LACTIC ACID, ED - Abnormal; Notable for the following components:   Lactic Acid, Venous 2.34 (*)    All other components within normal limits  CBG MONITORING, ED - Abnormal; Notable for the following components:   Glucose-Capillary 207 (*)    All other components within normal limits  CULTURE, BLOOD (ROUTINE X 2)  CULTURE, BLOOD (ROUTINE X 2)  URINE CULTURE  MAGNESIUM  CBG MONITORING, ED  CBG MONITORING, ED  CBG MONITORING, ED    EKG EKG Interpretation  Date/Time:  Monday October 27 2017 15:59:43 EDT Ventricular Rate:  88 PR Interval:    QRS Duration: 155 QT Interval:  486 QTC Calculation: 589 R Axis:   -73 Text Interpretation:  Sinus rhythm Sinus pause RBBB and LAFB RBBB and LAFB new from previous, T wave inversions V2-V3 Confirmed by Theotis Burrow [phone]) on 10/27/2017 4:54:21 PM   Radiology US Abdomen Limited  Result Date: 10/27/2017 CLINICAL DATA:  Elevated liver function tests, febrile. History of cholecystectomy. EXAM: ULTRASOUND ABDOMEN LIMITED RIGHT UPPER QUADRANT COMPARISON:  CT abdomen and  pelvis November 01, 2014 FINDINGS: Gallbladder: Status post cholecystectomy. No fluid collections within gallbladder fossa. Common bile duct: Diameter: 5 mm Stable anechoic 13 mm cyst LEFT lobe of the liver. Within normal limits in parenchymal echogenicity. Portal vein is patent on color Doppler imaging with normal direction of blood flow towards the liver. 16 mm anechoic cyst upper pole RIGHT kidney with acoustic enhancement. IMPRESSION: 1. No acute process in the RIGHT upper quadrant; status post cholecystectomy. Electronically Signed   By: Elon Alas M.D.   On: 10/27/2017 19:21   Dg Chest Port 1  View  Result Date: 10/27/2017 CLINICAL DATA:  Bradycardia.  Sepsis and fever. EXAM: PORTABLE CHEST 1 VIEW COMPARISON:  07/08/2016 FINDINGS: Normal heart size. No pleural effusion or edema. No airspace opacities identified. Review of the visualized osseous structures is unremarkable. IMPRESSION: 1. No acute cardiopulmonary abnormality. Electronically Signed   By: Kerby Moors M.D.   On: 10/27/2017 18:45    Procedures .Critical Care Performed by: Sharlett Iles, MD Authorized by: Sharlett Iles, MD   Critical care provider statement:    Critical care time (minutes):  35   Critical care time was exclusive of:  Separately billable procedures and treating other patients   Critical care was necessary to treat or prevent imminent or life-threatening deterioration of the following conditions:  Sepsis   Critical care was time spent personally by me on the following activities:  Development of treatment plan with patient or surrogate, discussions with consultants, evaluation of patient's response to treatment, examination of patient, obtaining history from patient or surrogate, ordering and performing treatments and interventions, ordering and review of laboratory studies, ordering and review of radiographic studies, re-evaluation of patient's condition and review of old charts   (including critical care time)  EMERGENCY DEPARTMENT  US GUIDANCE EXAM Emergency Ultrasound:  US Guidance for Needle Guidance  INDICATIONS: Difficult vascular access Linear probe used in real-time to visualize location of needle entry through skin.   PERFORMED BY: Other  Dr. Jola Schmidt IMAGES ARCHIVED?: No LIMITATIONS: None VIEWS USED: Transverse INTERPRETATION: Needle visualized within vein Location: Left EJ No complications Medications Ordered in ED Medications  potassium chloride 10 mEq in 100 mL IVPB (10 mEq Intravenous New Bag/Given 10/27/17 1835)  sodium chloride 0.9 % bolus 1,000 mL (0 mLs  Intravenous Stopped 10/27/17 1647)  sodium chloride 0.9 % bolus 1,000 mL (0 mLs Intravenous Stopped 10/27/17 1811)  dextrose 50 % solution 50 mL (50 mLs Intravenous Given 10/27/17 1610)  piperacillin-tazobactam (ZOSYN) IVPB 3.375 g (0 g Intravenous Stopped 10/27/17 1735)  vancomycin (VANCOCIN) 1,500 mg in sodium chloride 0.9 % 500 mL IVPB (0 mg Intravenous Paused 10/27/17 1850)  sodium chloride 0.9 % bolus 500 mL (500 mLs Intravenous New Bag/Given 10/27/17 1815)  norepinephrine (LEVOPHED) 4 mg in dextrose 5 % 250 mL (0.016 mg/mL) infusion (4 mcg/min Intravenous Rate/Dose Change 10/27/17 1917)     Initial Impression / Assessment and Plan / ED Course  I have reviewed the triage vital signs and the nursing notes.  Pertinent labs & imaging results that were available during my care of the patient were reviewed by me and considered in my medical decision making (see chart for details).    Pt hypotensive, hypoglycemic on arrival.  Fluid bolus started, given D50 with immediate improvement in his mental status.  He was noted to have a rectal temperature of 101.5.  Initiated a code sepsis with blood and urine cultures, vancomycin and Zosyn, and sequential fluid boluses given his  history of CHF.  Lactate 2.34, urine suggestive of infection, WBC 14.6, potassium 2.8, creatinine 1.22, AST 274, ALT 312, total bilirubin 4.  Right upper quadrant ultrasound negative for acute process.  Troponin 0.09.  Chest x-ray negative acute.  Patient remained hypotensive after fluid boluses therefore initiated a levophed drip. Contacted CCM and discussed w/ Dr. Debbora Dus.  Patient admitted to MICU for further treatment.  Final Clinical Impressions(s) / ED Diagnoses   Final diagnoses:  None    ED Discharge Orders    None       Krishiv Sandler, Wenda Overland, MD 10/27/17 2037    Rex Kras, Wenda Overland, MD 10/28/17 1348

## 2017-10-27 NOTE — Progress Notes (Signed)
Pharmacy Antibiotic Note  Jaime [name] is a 77 y.o. male admitted on 10/27/2017 with sepsis.  Pharmacy has been consulted for vancomycin and Zosyn dosing.  Tm 101.5, WBC 14.6, LA 2.34  Received the following in the ED:  - vancomycin 1.5 g IV load  - Zosyn 3.375 g 30 min IV x 1  Plan: Vancomycin 750 mg IV every 12 hours.  Goal trough 15-20 mcg/mL. Zosyn 3.375g IV q8h (4 hour infusion).  F/U cultures, LOT, and vanc trough prn  Weight: 160 lb (72.6 kg)  Temp (24hrs), Avg:101.5 F (38.6 C), Min:101.5 F (38.6 C), Max:101.5 F (38.6 C)  Recent Labs  Lab 10/27/17 1614 10/27/17 1749  WBC 14.6*  --   CREATININE 1.22  --   LATICACIDVEN  --  2.34*    CrCl cannot be calculated (Unknown ideal weight.).    Allergies  Allergen Reactions  . Bee Venom Anaphylaxis  . Beta Adrenergic Blockers Other (See Comments)    Bradycardia with carvedilol  . Carvedilol Other (See Comments)    Bradycardia with all Beta blockers   . Influenza Vaccines Other (See Comments)    Sick for months  . Atorvastatin     Severe pain in stumps    Antimicrobials this admission: Zosyn 4/8 >> Vancomycin 4/8>>  Dose adjustments this admission: None  Microbiology results: 4/8 BCx: sent 4/8 UCx: sent  Thank you for allowing pharmacy to be a part of this patient's care.  Megin Consalvo A Pualani Borah 10/27/2017 8:30 PM

## 2017-10-27 NOTE — ED Triage Notes (Signed)
Pt arrived via [facility] from home, wife called for pt being unresponsive.  BP 60/40 on EMS arrival.  Comes to the ED with Epi drip infusing at 25mcg/min.  Right side nasal trumpet.  EMS was pacing at 70bpm.  Wife reports malaise yesterday.  EMS gave 57ml NS.

## 2017-10-27 NOTE — ED Notes (Signed)
CBG 78. 

## 2017-10-27 NOTE — ED Notes (Signed)
Pt's BP called and reported to Dr. Rex Kras.  Pt placed in trendelenburg.

## 2017-10-28 ENCOUNTER — Other Ambulatory Visit (HOSPITAL_COMMUNITY): Payer: Medicare Other

## 2017-10-28 ENCOUNTER — Other Ambulatory Visit: Payer: Self-pay

## 2017-10-28 ENCOUNTER — Ambulatory Visit: Payer: Medicare Other | Admitting: Internal Medicine

## 2017-10-28 DIAGNOSIS — R7881 Bacteremia: Secondary | ICD-10-CM

## 2017-10-28 DIAGNOSIS — I4510 Unspecified right bundle-branch block: Secondary | ICD-10-CM

## 2017-10-28 DIAGNOSIS — E872 Acidosis: Secondary | ICD-10-CM

## 2017-10-28 DIAGNOSIS — E8729 Other acidosis: Secondary | ICD-10-CM

## 2017-10-28 DIAGNOSIS — E162 Hypoglycemia, unspecified: Secondary | ICD-10-CM

## 2017-10-28 DIAGNOSIS — I441 Atrioventricular block, second degree: Secondary | ICD-10-CM

## 2017-10-28 LAB — CBC
HCT: 39.8 % (ref 39.0–52.0)
Hemoglobin: 13.1 g/dL (ref 13.0–17.0)
MCH: 25.9 pg — ABNORMAL LOW (ref 26.0–34.0)
MCHC: 32.9 g/dL (ref 30.0–36.0)
MCV: 78.8 fL (ref 78.0–100.0)
PLATELETS: 120 10*3/uL — AB (ref 150–400)
RBC: 5.05 MIL/uL (ref 4.22–5.81)
RDW: 17.8 % — AB (ref 11.5–15.5)
WBC: 10.2 10*3/uL (ref 4.0–10.5)

## 2017-10-28 LAB — HEPATIC FUNCTION PANEL
ALK PHOS: 134 U/L — AB (ref 38–126)
ALT: 275 U/L — AB (ref 17–63)
AST: 211 U/L — AB (ref 15–41)
Albumin: 2.8 g/dL — ABNORMAL LOW (ref 3.5–5.0)
Bilirubin, Direct: 2.1 mg/dL — ABNORMAL HIGH (ref 0.1–0.5)
Indirect Bilirubin: 2.4 mg/dL — ABNORMAL HIGH (ref 0.3–0.9)
TOTAL PROTEIN: 5.9 g/dL — AB (ref 6.5–8.1)
Total Bilirubin: 4.5 mg/dL — ABNORMAL HIGH (ref 0.3–1.2)

## 2017-10-28 LAB — TROPONIN I
TROPONIN I: 0.34 ng/mL — AB (ref <0.03)
TROPONIN I: 1.03 ng/mL — AB (ref <0.03)
Troponin I: 0.24 ng/mL (ref <0.03)
Troponin I: 0.44 ng/mL (ref <0.03)

## 2017-10-28 LAB — BLOOD CULTURE ID PANEL (REFLEXED)
ACINETOBACTER BAUMANNII: NOT DETECTED
Acinetobacter baumannii: NOT DETECTED
CANDIDA ALBICANS: NOT DETECTED
CANDIDA GLABRATA: NOT DETECTED
CANDIDA GLABRATA: NOT DETECTED
CANDIDA KRUSEI: NOT DETECTED
CANDIDA PARAPSILOSIS: NOT DETECTED
Candida albicans: NOT DETECTED
Candida krusei: NOT DETECTED
Candida parapsilosis: NOT DETECTED
Candida tropicalis: NOT DETECTED
Candida tropicalis: NOT DETECTED
Carbapenem resistance: NOT DETECTED
ENTEROBACTER CLOACAE COMPLEX: NOT DETECTED
ENTEROBACTERIACEAE SPECIES: DETECTED — AB
ENTEROCOCCUS SPECIES: NOT DETECTED
ENTEROCOCCUS SPECIES: NOT DETECTED
ESCHERICHIA COLI: NOT DETECTED
ESCHERICHIA COLI: NOT DETECTED
Enterobacter cloacae complex: NOT DETECTED
Enterobacteriaceae species: NOT DETECTED
Haemophilus influenzae: NOT DETECTED
Haemophilus influenzae: NOT DETECTED
KLEBSIELLA OXYTOCA: NOT DETECTED
KLEBSIELLA OXYTOCA: NOT DETECTED
Klebsiella pneumoniae: DETECTED — AB
Klebsiella pneumoniae: NOT DETECTED
LISTERIA MONOCYTOGENES: NOT DETECTED
LISTERIA MONOCYTOGENES: NOT DETECTED
Methicillin resistance: NOT DETECTED
NEISSERIA MENINGITIDIS: NOT DETECTED
Neisseria meningitidis: NOT DETECTED
PSEUDOMONAS AERUGINOSA: NOT DETECTED
PSEUDOMONAS AERUGINOSA: NOT DETECTED
Proteus species: NOT DETECTED
Proteus species: NOT DETECTED
STAPHYLOCOCCUS AUREUS BCID: NOT DETECTED
STAPHYLOCOCCUS SPECIES: NOT DETECTED
STREPTOCOCCUS AGALACTIAE: NOT DETECTED
STREPTOCOCCUS AGALACTIAE: NOT DETECTED
STREPTOCOCCUS PNEUMONIAE: NOT DETECTED
STREPTOCOCCUS PNEUMONIAE: NOT DETECTED
STREPTOCOCCUS PYOGENES: NOT DETECTED
STREPTOCOCCUS PYOGENES: NOT DETECTED
Serratia marcescens: NOT DETECTED
Serratia marcescens: NOT DETECTED
Staphylococcus aureus (BCID): NOT DETECTED
Staphylococcus species: DETECTED — AB
Streptococcus species: NOT DETECTED
Streptococcus species: NOT DETECTED

## 2017-10-28 LAB — URINE CULTURE

## 2017-10-28 LAB — GLUCOSE, CAPILLARY
GLUCOSE-CAPILLARY: 83 mg/dL (ref 65–99)
GLUCOSE-CAPILLARY: 150 mg/dL — AB (ref 65–99)
GLUCOSE-CAPILLARY: 174 mg/dL — AB (ref 65–99)
Glucose-Capillary: 57 mg/dL — ABNORMAL LOW (ref 65–99)
Glucose-Capillary: 49 mg/dL — ABNORMAL LOW (ref 65–99)
Glucose-Capillary: 60 mg/dL — ABNORMAL LOW (ref 65–99)
Glucose-Capillary: 120 mg/dL — ABNORMAL HIGH (ref 65–99)
Glucose-Capillary: 123 mg/dL — ABNORMAL HIGH (ref 65–99)

## 2017-10-28 LAB — LACTIC ACID, PLASMA: Lactic Acid, Venous: 1.4 mmol/L (ref 0.5–1.9)

## 2017-10-28 LAB — BASIC METABOLIC PANEL
ANION GAP: 11 (ref 5–15)
BUN: 17 mg/dL (ref 6–20)
CALCIUM: 8.1 mg/dL — AB (ref 8.9–10.3)
CO2: 14 mmol/L — ABNORMAL LOW (ref 22–32)
Chloride: 116 mmol/L — ABNORMAL HIGH (ref 101–111)
Creatinine, Ser: 0.77 mg/dL (ref 0.61–1.24)
GFR calc Af Amer: >60 mL/min (ref >60)
GLUCOSE: 87 mg/dL (ref 65–99)
Potassium: 4.8 mmol/L (ref 3.5–5.1)
Sodium: 141 mmol/L (ref 135–145)

## 2017-10-28 LAB — MRSA PCR SCREENING: MRSA by PCR: NEGATIVE

## 2017-10-28 LAB — PROCALCITONIN: PROCALCITONIN: 3.63 ng/mL

## 2017-10-28 LAB — MAGNESIUM: Magnesium: 2.4 mg/dL (ref 1.7–2.4)

## 2017-10-28 LAB — PHOSPHORUS: Phosphorus: 2.4 mg/dL — ABNORMAL LOW (ref 2.5–4.6)

## 2017-10-28 MED ORDER — SODIUM CHLORIDE 0.9 % IV SOLN
2.0000 g | INTRAVENOUS | Status: DC
  Administered 2017-10-28 – 2017-10-29 (×2): 2 g via INTRAVENOUS
  Filled 2017-10-28 (×3): qty 20

## 2017-10-28 MED ORDER — DEXTROSE IN LACTATED RINGERS 5 % IV SOLN
INTRAVENOUS | Status: DC
  Administered 2017-10-28: 1000 mL via INTRAVENOUS
  Administered 2017-10-29: 10:00:00 via INTRAVENOUS

## 2017-10-28 MED ORDER — ATROPINE SULFATE 1 MG/10ML IJ SOSY
PREFILLED_SYRINGE | INTRAMUSCULAR | Status: AC
  Filled 2017-10-28: qty 10

## 2017-10-28 MED ORDER — INSULIN ASPART 100 UNIT/ML ~~LOC~~ SOLN
0.0000 [IU] | Freq: Three times a day (TID) | SUBCUTANEOUS | Status: DC
  Administered 2017-10-28 (×2): 2 [IU] via SUBCUTANEOUS
  Administered 2017-10-28: 3 [IU] via SUBCUTANEOUS

## 2017-10-28 MED ORDER — IBUPROFEN 200 MG PO TABS
400.0000 mg | ORAL_TABLET | Freq: Four times a day (QID) | ORAL | Status: DC | PRN
  Administered 2017-10-28: 400 mg via ORAL
  Filled 2017-10-28: qty 2

## 2017-10-28 MED ORDER — SCOPOLAMINE 1 MG/3DAYS TD PT72
1.0000 | MEDICATED_PATCH | TRANSDERMAL | Status: DC
  Administered 2017-10-28: 1.5 mg via TRANSDERMAL
  Filled 2017-10-28: qty 1

## 2017-10-28 MED ORDER — ASPIRIN 81 MG PO CHEW
81.0000 mg | CHEWABLE_TABLET | Freq: Every day | ORAL | Status: DC
  Administered 2017-10-28 – 2017-10-29 (×2): 81 mg via ORAL
  Filled 2017-10-28 (×2): qty 1

## 2017-10-28 NOTE — Progress Notes (Signed)
PHARMACY - PHYSICIAN COMMUNICATION CRITICAL VALUE ALERT - BLOOD CULTURE IDENTIFICATION (BCID)  Jaime [name] is an 77 y.o. male who presented to [facility] on 10/27/2017 with a chief complaint of shock (mixed septic and cardiogenic).  Assessment:  1/3 BCx with Klebsiella bacteremia with likely source of urine. History of Klebsiella UTI in past that was pan sensitive. Also with 1/3 BCx with CONS  Name of physician (or Provider) Contacted: Marni Griffon, NP CCM  Current antibiotics: Changed to Ceftriaxone 2g IV q24h earlier today  Changes to prescribed antibiotics recommended: none, CONS is likely contaminant, already appropriately treating klebsiella bacteremia  Results for orders placed or performed during the hospital encounter of 10/27/17  Blood Culture ID Panel (Reflexed) (Collected: 10/27/2017  4:56 PM)  Result Value Ref Range   Enterococcus species NOT DETECTED NOT DETECTED   Listeria monocytogenes NOT DETECTED NOT DETECTED   Staphylococcus species DETECTED (A) NOT DETECTED   Staphylococcus aureus NOT DETECTED NOT DETECTED   Methicillin resistance NOT DETECTED NOT DETECTED   Streptococcus species NOT DETECTED NOT DETECTED   Streptococcus agalactiae NOT DETECTED NOT DETECTED   Streptococcus pneumoniae NOT DETECTED NOT DETECTED   Streptococcus pyogenes NOT DETECTED NOT DETECTED   Acinetobacter baumannii NOT DETECTED NOT DETECTED   Enterobacteriaceae species NOT DETECTED NOT DETECTED   Enterobacter cloacae complex NOT DETECTED NOT DETECTED   Escherichia coli NOT DETECTED NOT DETECTED   Klebsiella oxytoca NOT DETECTED NOT DETECTED   Klebsiella pneumoniae NOT DETECTED NOT DETECTED   Proteus species NOT DETECTED NOT DETECTED   Serratia marcescens NOT DETECTED NOT DETECTED   Haemophilus influenzae NOT DETECTED NOT DETECTED   Neisseria meningitidis NOT DETECTED NOT DETECTED   Pseudomonas aeruginosa NOT DETECTED NOT DETECTED   Candida albicans NOT DETECTED NOT DETECTED   Candida  glabrata NOT DETECTED NOT DETECTED   Candida krusei NOT DETECTED NOT DETECTED   Candida parapsilosis NOT DETECTED NOT DETECTED   Candida tropicalis NOT DETECTED NOT DETECTED    Elicia Lamp, PharmD, BCPS Clinical Pharmacist 10/28/2017 5:15 PM

## 2017-10-28 NOTE — Progress Notes (Signed)
eLink Physician-Brief Progress Note Patient Name: Jaime [name] DOB: [birth year] MRN: [id number]   Date of Service  10/28/2017  HPI/Events of Note  EKG with 1st degree AV block, RBBB and LAHB. Possible lateral infarct, age undetermined. Clinical picture c/w demand ischemia vs NSTEMI.  eICU Interventions  Continue to trend troponin.      Intervention Category Intermediate Interventions: Diagnostic test evaluation  Lysle Dingwall 10/28/2017, 9:54 PM

## 2017-10-28 NOTE — Progress Notes (Signed)
Spoke with [facility] concerning pts. Bradycardia and suspected 2nd degree type I heart block. Obtained 12 lead EKG and spoke with Dr. Oletta Darter who advised to place atropine at bedside and ordered scopolomine patch. Patch placed as ordered behind right ear. Will continue to monitor.

## 2017-10-28 NOTE — Progress Notes (Signed)
eLink Physician-Brief Progress Note Patient Name: Jaime [name] DOB: Jul 01, 1941 MRN: [id number]   Date of Service  10/28/2017  HPI/Events of Note  Bradycardia - HR drops into 30's when asleep. BP OK. EKG with 2nd degree AV block ([place] I).   eICU Interventions  Will order: 1. Atropine syringe to bedside.  2. Scopolamine patch to skin Q 72 hours.      Intervention Category Major Interventions: Arrhythmia - evaluation and management  Sommer,Steven Eugene 10/28/2017, 12:01 AM

## 2017-10-28 NOTE — Progress Notes (Signed)
eLink Physician-Brief Progress Note Patient Name: Jaime [name] DOB: 10/13/1940 MRN: [id number]   Date of Service  10/28/2017  HPI/Events of Note  Troponin = 0.44 --> 1.03. Already on ASA. Allergy to B-Blockers (bradycardia).  eICU Interventions  Will order: 1. 12 Lead EKG STAT. 2. Continue to cycle Troponin.      Intervention Category Intermediate Interventions: Diagnostic test evaluation  Lauralei Clouse Eugene 10/28/2017, 9:12 PM

## 2017-10-28 NOTE — Progress Notes (Signed)
Patient arrived to unit, no complaints of pain. Oriented to unit, safety measures in place. Will keep patient updated on plan of care.

## 2017-10-28 NOTE — Progress Notes (Signed)
Patient wife at bedside notified of impending transfer to unit [place] bed 12.

## 2017-10-28 NOTE — Progress Notes (Signed)
eLink Physician-Brief Progress Note Patient Name: Jaime [name] DOB: [birth year] MRN: [id number]   Date of Service  10/28/2017  HPI/Events of Note  Troponin = 0.12 --> 0.24. Demand ischemia.   eICU Interventions  Will order: 1. ASA 81 mg PO now and Q day.  2. Continue to trend Troponin.     Intervention Category Intermediate Interventions: Diagnostic test evaluation  Sommer,Jaime [name] Cornelia Copa 10/28/2017, 5:44 AM

## 2017-10-28 NOTE — Progress Notes (Signed)
Report called to [place] on [facility] [place] .Patient to transfer to [facility] bed 12.

## 2017-10-28 NOTE — Progress Notes (Signed)
Hypoglycemic Event  CBG: 49  Treatment: 15 gram carbohydrate  Symptoms: None; patient states that he feels "fine"  Follow-up CBG: Time: 0820 CBG Result: 60  Possible Reasons for Event: unknown  Comments/MD notified: PCCM NP [place]

## 2017-10-28 NOTE — Progress Notes (Signed)
Hypoglycemic Event  CBG: 60  Treatment: 15 gram carbohydrate snack  Symptoms: none; patient feels "fine"  Follow-up CBG: Time: 0850 CBG Result: 120  Possible Reasons for Event: unknown  Comments/MD notified: PCCM NP Lindie Spruce Najia Hurlbutt

## 2017-10-28 NOTE — Progress Notes (Signed)
Hypoglycemic Event  CBG: 57  Treatment: 15 GM carbohydrate snack  Symptoms: None  Follow-up CBG: Time:0326 CBG Result: 83  Possible Reasons for Event: Unknown  Comments/MD notified: hypoglycemia protocol implemented    [place] Livings A

## 2017-10-28 NOTE — Progress Notes (Addendum)
PULMONARY / CRITICAL CARE MEDICINE   Name: Jaime [name] MRN: [id number] DOB: 05-22-1941    ADMISSION DATE:  10/27/2017 CONSULTATION DATE:  10/27/17  REFERRING MD:  Rex Kras  CHIEF COMPLAINT:  AMS  HISTORY OF PRESENT ILLNESS:  Jaime [name] is a 77 y.o. male  brought to [facility] ED 4/8 via EMS after being found unresponsive by wife.  On EMS arrival, BP was 60/40.  Pt was started on epinephrine infusion and transported to ED.  In ED, he remained hypotensive after fluids (2.5L total); therefore, was started on levophed.  He was found to have urosepsis.  CXR and abdominal US were negative (abd [place] ordered due to transaminitis).  Per pt and wife, on day prior to admit, pt had generalized weakness and "just didn't feel well".  He was clammy, had subjective fever, chills, myalgias.  Urine looked dark.  Pt had no PO intake all day.  Denied any chest pain, SOB, N/V/D.  No exposures to known sick contacts.  He was scheduled to see PCP tomorrow 4/9 for general check up.  SUBJECTIVE:   Off pressors   VITAL SIGNS: Blood Pressure (Abnormal) 168/93   Pulse 100   Temperature 100.1 F (37.8 C) (Oral)   Respiration (Abnormal) 22   Height 5\' 11"  (1.803 m) Comment: pre BL AKA height  Weight 139 lb 12.4 oz (63.4 kg)   Oxygen Saturation 97%   Body Mass Index 19.49 kg/m      INTAKE / OUTPUT:  Intake/Output Summary (Last 24 hours) at 10/28/2017 1157 Last data filed at 10/28/2017 1000 Gross per 24 hour  Intake 4123.12 ml  Output 550 ml  Net 3573.12 ml      PHYSICAL EXAMINATION: General: 77 year old male patient resting comfortably in bed he is in no acute distress HEENT: Normocephalic atraumatic poor dentition.  Mucous membranes moist Pulmonary: Clear to auscultation Cardiac: Regular rate and rhythm Abdomen: Soft nontender Extremities: Bilateral AKA, otherwise warm with brisk cap refill Neuro: Awake, oriented x1, poor memory.  Appropriate and cooperative LABS:  BMET Recent Labs  Lab  10/27/17 1614 10/28/17 0333  NA 144 141  K 2.8* 4.8  CL 105 116*  CO2 26 14*  BUN 21* 17  CREATININE 1.22 0.77  GLUCOSE 69 87    Electrolytes Recent Labs  Lab 10/27/17 1614 10/27/17 1909 10/28/17 0333  CALCIUM 7.9*  --  8.1*  MG  --  0.8* 2.4  PHOS  --   --  2.4*    CBC Recent Labs  Lab 10/27/17 1614 10/28/17 0333  WBC 14.6* 10.2  HGB 12.2* 13.1  HCT 36.9* 39.8  PLT 140* 120*    Coag's No results for input(s): APTT, INR in the last 168 hours.  Sepsis Markers Recent Labs  Lab 10/27/17 1749 10/27/17 2146 10/28/17 0000 10/28/17 0333  LATICACIDVEN 2.34* 1.2 1.4  --   PROCALCITON  --  4.27  --  3.63    ABG No results for input(s): PHART, PCO2ART, PO2ART in the last 168 hours.  Liver Enzymes Recent Labs  Lab 10/27/17 1614 10/28/17 0333  AST 274* 211*  ALT 312* 275*  ALKPHOS 148* 134*  BILITOT 4.0* 4.5*  ALBUMIN 2.7* 2.8*    Cardiac Enzymes Recent Labs  Lab 10/27/17 2146 10/28/17 0333 10/28/17 0618  TROPONINI 0.10* 0.24* 0.34*    Glucose Recent Labs  Lab 10/27/17 2351 10/28/17 0308 10/28/17 0327 10/28/17 0756 10/28/17 0820 10/28/17 0850  GLUCAP 107* 57* 83 49* 60* 120*    Imaging US  Abdomen Limited  Result Date: 10/27/2017 CLINICAL DATA:  Elevated liver function tests, febrile. History of cholecystectomy. EXAM: ULTRASOUND ABDOMEN LIMITED RIGHT UPPER QUADRANT COMPARISON:  CT abdomen and pelvis November 01, 2014 FINDINGS: Gallbladder: Status post cholecystectomy. No fluid collections within gallbladder fossa. Common bile duct: Diameter: 5 mm Stable anechoic 13 mm cyst LEFT lobe of the liver. Within normal limits in parenchymal echogenicity. Portal vein is patent on color Doppler imaging with normal direction of blood flow towards the liver. 16 mm anechoic cyst upper pole RIGHT kidney with acoustic enhancement. IMPRESSION: 1. No acute process in the RIGHT upper quadrant; status post cholecystectomy. Electronically Signed   By: Elon Alas  M.D.   On: 10/27/2017 19:21   Dg Chest Port 1 View  Result Date: 10/27/2017 CLINICAL DATA:  Bradycardia.  Sepsis and fever. EXAM: PORTABLE CHEST 1 VIEW COMPARISON:  07/08/2016 FINDINGS: Normal heart size. No pleural effusion or edema. No airspace opacities identified. Review of the visualized osseous structures is unremarkable. IMPRESSION: 1. No acute cardiopulmonary abnormality. Electronically Signed   By: Kerby Moors M.D.   On: 10/27/2017 18:45     STUDIES:  Abd [place] 4/8 > no acute process. CXR 4/8 > no acute process.  CULTURES: Blood 4/8 : klebsiella PNA>>> Urine 4/8 >   ANTIBIOTICS: Vanc 4/8 > stop  Zosyn 4/8 > 4/9 Rocephin 4/9>>>   SIGNIFICANT EVENTS: 4/8 > admit.  LINES/TUBES: None.  DISCUSSION: 76 y.o. male admitted 4/8 with urosepsis.  Required levophed due to persistent hypotension after 2500cc NS bolus.  ASSESSMENT / PLAN:   Hx OSA (does not use CPAP), lung nodule (resolved on CT chest 2008), PAH. Former smoker. Plan Pulse ox .  Septic shock - due to urosepsis.  Shock resolved Plan Cont IV hydration abx narrowed to rocephin F/u final sensitivities   Hx HTN, HLD, CAD, ICM, sCHF (Echo from Jan 2017 with EF 35%, G1DD). Mild trop I elevation Plan Cont tele Hold lasix Cont asa and statin Will ask cards to see but doubt much to do other than get him re-established   Fluid and electrolyte imbalance: mild hyperchloremia and NAG metabolic acidosis (after NaCl resuscitation) Plan Dc NS Change IVF to D5LR at 50  Transaminitis. (prob shock liver) -[place] neg had prior chole  Plan Trend LFTs  Thrombocytopenia in setting of sepsis Plan Trend cbc  Hypoglycemia -  Hx DM. Plan Add dextrose to IVFs ssi     Hx TIA, dementia, insomnia, MS. Plan Holding baclofen and home rx   Family updated: Wife updated at bedside.  Interdisciplinary Family Meeting v Palliative Care Meeting:  Due by: 11/02/17.  My time 45 minutes  Erick Colace ACNP-BC [facility] Pager # [phone] OR # [phone] if no answer

## 2017-10-28 NOTE — Consult Note (Addendum)
The patient has been seen in conjunction with Fabian Sharp, PA-C. All aspects of care have been considered and discussed. The patient has been personally interviewed, examined, and all clinical data has been reviewed.   We are consulted because of an elevated troponin I in the setting of sepsis and hypotension.  In absence of chest discomfort or other ischemic findings, the troponin is felt to represent demand ischemia.  No workup is necessary.  The patient had significant cardiac conduction system disease with previously documented first-degree AV block and right bundle branch block.  In addition on this admission the patient has exhibited evidence of type I second-degree AV block.  We would advise avoiding medications that slow cardiac electrical conduction.  The patient is at risk for developing higher degree heart block and for potential pacemaker requirement.  That is not indicated at this time.   Cardiology Consultation:   Patient ID: Jaime [name]; [id number]; [birth year]   Admit date: 10/27/2017 Date of Consult: 10/28/2017  Primary Care Provider: [name], MD Primary Cardiologist: [name], MD  Primary Electrophysiologist:     Patient Profile:   Jaime [name] is a 77 y.o. male with a hx of chronic systolic and diastolic heart failure, ischemic cardiomyopathy, CAD s/p PCI to LCx (2007), MS, DM, s/p bilateral BKA, s/p cholecystectomy (2016), current smoker, PVD, and fem-tib bypass (2013) who is being seen today for the evaluation of elevated troponin at the request of Dr. Gilford Raid.  History of Present Illness:   Jaime [name] is known to this service and last saw Dr. Meda Coffee on 07/07/17. He was in his usual state of health.  ASA was decreased to 81 mg. Last echo with LVEF of 35% (2017). He is not on a BB for bradycardia. Lasix 20 mg daily. He is compliant on all medications. He has a history of TIA, dementia, MS, and insomnia.  He was brought to [facility] by EMS after being  found unresponsive by his wife yesterday morning. He was unable to wake from sleep.  On EMS arrival, BP was 60/40 and was started on epinephrine and transported to ED. Per the wife, the day prior he had generalized weakness, clammy, subjective fever, chills, and myalgias. Urine was reportedly dark with no PO intake that day.   He was found to be septic due to urosepsis.  He is bacteremic with gram negative species, ABX narrowed to rocephin. He is now off pressors.   On my interview, the patient denies chest pain, palpitations, dizziness, and syncope.  He is alert and responds to questions. Wife at bedside. Patient does not want to see his medical providers.  Mild troponin elevation, peak 0.44. EKG with AV conduction abnormality - suspect progression compared to prior.    Past Medical History:  Diagnosis Date  . Atherosclerotic PVD with ulceration ([place])    left foot  . CAD (coronary artery disease) CARDIOLOGIST- DR KATZ-- VISIT 06-05-2011 IN EPIC   non-STEMI, 2007.Marland Kitchenoccluded circumflex.. Taxus stent placed...residual 80% LAD...50% RCA  . Cardiomyopathy, ischemic    EF 45% per ECHO 2008  //   EF 25%, echo, August, 2013 //  Echo (8/15):  Mild LVH, EF 30-35%, ant-lat and lat AK, inf HK, Gr 1 DD, mild MR, mild LAE  . Carotid artery disease ([place])    a.  Doppler, February, 2012, 0-39% bilateral,Mild smooth plaque;  b.  Carotid US (8/15):  Bilateral 1-39% ICA >>> F/u 2 years  . Chronic indwelling Foley catheter   . Constipation   .  Dementia   . Diabetes mellitus    INSULIN-DEPENDent  . Fatigue SEVERE  . H/O pleural effusion 2008   POST THORACENTESIS  . History of colon polyps PRECANCEROUS  . Hyperlipidemia   . Hypertension   . Impotence   . Increased prostate specific antigen (PSA) velocity   . Insomnia   . Lung nodule    resolved 11-2006 CT Chest  . Multiple sclerosis ([place]) [place] -- LAST VISIT 11-20-2010  NOTE W/ CHART  . PAC (premature atrial contraction)      December, 2013  . Pulmonary hypertension ([place])    moderate ECHO Jan 2008  . Scoliosis associated with other condition   . Sleep apnea   . Systolic heart failure   . TIA (transient ischemic attack)   . Tobacco abuse    quit   . Urinary retention    dx ~ 2-12, like from [place], now with a catheter, saw urology  . Urinary tract infection    hx of    Past Surgical History:  Procedure Laterality Date  . ABDOMINAL ANGIOGRAM  12/17/2011   Procedure: ABDOMINAL ANGIOGRAM;  Surgeon: Serafina Mitchell, MD;  Location[facility] CATH LAB;  Service: Cardiovascular;;  . ABDOMINAL AORTAGRAM N/A 08/19/2013   Procedure: ABDOMINAL Maxcine Ham;  Surgeon: Conrad Anchorage, MD;  Location: [facility] CATH LAB;  Service: Cardiovascular;  Laterality: N/A;  . AMPUTATION  03/17/2012   Procedure: AMPUTATION ABOVE KNEE;  Surgeon: Conrad Applewold, MD;  Location: [place];  Service: Vascular;  Laterality: Left;  . AMPUTATION  06/03/2012   Procedure: AMPUTATION ABOVE KNEE;  Surgeon: Conrad Fisher, MD;  Location: [place];  Service: Vascular;  Laterality: Right;  . CHOLECYSTECTOMY N/A 10/25/2014   Procedure: LAPAROSCOPIC CHOLECYSTECTOMY ;  Surgeon: Coralie Keens, MD;  Location: [place];  Service: General;  Laterality: N/A;  . CORONARY ANGIOPLASTY WITH STENT PLACEMENT  05-01-2006   OCCLUDED CIRCUMFLEX -- TAXUS STENT PLACMENT  AND RESIDUAL 80% LAD,  50% RCA  . CYSTOSCOPY  07/30/2011   Procedure: CYSTOSCOPY;  Surgeon: Hanley Ben, MD;  Location: [facility];  Service: Urology;  Laterality: N/A;  . ERCP N/A 08/25/2014   Procedure: ENDOSCOPIC RETROGRADE CHOLANGIOPANCREATOGRAPHY (ERCP);  Surgeon: Ladene Artist, MD;  Location: [facility] ENDOSCOPY;  Service: Endoscopy;  Laterality: N/A;  . FEMORAL-POPLITEAL BYPASS GRAFT  03/11/2012   Procedure: BYPASS GRAFT FEMORAL-POPLITEAL ARTERY;  Surgeon: Conrad Pierz, MD;  Location: [place];  Service: Vascular;  Laterality: Left;  embolectomy left lower leg  . FEMORAL-TIBIAL BYPASS GRAFT  03/11/2012   Procedure:  BYPASS GRAFT FEMORAL-TIBIAL ARTERY;  Surgeon: Conrad Mapleton, MD;  Location[facility];  Service: Vascular;  Laterality: Left;  Left Femoral -Tibial trunk bypass, Endarterectomy of Tibial- Peroneal trunk with vein angioplasty.  Marland Kitchen HERNIA REPAIR  1990   (R)  . INTRAOPERATIVE ARTERIOGRAM  03/11/2012   Procedure: INTRA OPERATIVE ARTERIOGRAM;  Surgeon: Conrad Karlstad, MD;  Location: [place];  Service: Vascular;  Laterality: Left;  . LOWER EXTREMITY ANGIOGRAM Bilateral 12/17/2011   Procedure: LOWER EXTREMITY ANGIOGRAM;  Surgeon: Serafina Mitchell, MD;  Location: [facility] CATH LAB;  Service: Cardiovascular;  Laterality: Bilateral;  bil lower extrem angio  . THORACENTESIS  2008   PLEURAL EFFUSION  . TRANSURETHRAL RESECTION OF PROSTATE  07/30/2011   Procedure: TRANSURETHRAL RESECTION OF THE PROSTATE (TURP);  Surgeon: Hanley Ben, MD;  Location[facility];  Service: Urology;  Laterality: N/A;     Home Medications:  Prior to Admission medications   Medication Sig Start Date End Date Taking? Authorizing Provider  albuterol (PROVENTIL HFA;VENTOLIN HFA) 108 (90 Base) MCG/ACT inhaler Inhale 2 puffs into the lungs every 6 (six) hours as needed for wheezing or shortness of breath. 12/28/15  Yes Dorothy Spark, MD  aspirin EC 81 MG tablet Take 1 tablet (81 mg total) by mouth daily. 07/07/17  Yes Dorothy Spark, MD  azelastine (ASTELIN) 0.1 % nasal spray Place 2 sprays into both nostrils at bedtime as needed for rhinitis. Use in each nostril as directed 08/28/15  Yes [name], Alda Berthold, MD  baclofen (LIORESAL) 10 MG tablet Take 0.5 tablets (5 mg total) by mouth 2 (two) times daily. 12/25/16  Yes Jaffe, Adam R, DO  Cholecalciferol (VITAMIN D) 2000 units CAPS Take 1 capsule by mouth daily.    Yes [provider]  diclofenac sodium (VOLTAREN) 1 % GEL Apply 1 application topically 2 (two) times daily as needed (pain). For pain 03/09/12  Yes Kirsteins, Luanna Salk, MD  furosemide (LASIX) 20 MG tablet TAKE 1 TABLET(20  MG) BY MOUTH DAILY 09/16/17  Yes Dorothy Spark, MD  glucagon (GLUCAGEN) 1 MG SOLR injection Inject 1 mg into the vein once as needed for low blood sugar. Reported on 11/16/2015   Yes [provider]  insulin aspart protamine - aspart (NOVOLOG 70/30 MIX) (70-30) 100 UNIT/ML FlexPen Inject 0.05 mLs (5 Units total) into the skin 2 (two) times daily. 03/12/17  Yes Burns, Claudina Lick, MD  lisinopril (PRINIVIL,ZESTRIL) 10 MG tablet TAKE 1 TABLET BY MOUTH EVERY DAY Patient taking differently: TAKE 10 mg TABLET BY MOUTH EVERY DAY 05/26/17  Yes Burns, Claudina Lick, MD  Multiple Vitamins-Minerals (MULTIVITAMIN WITH MINERALS) tablet Take 1 tablet by mouth daily.   Yes [provider]  pantoprazole (PROTONIX) 40 MG tablet Take 1 tablet (40 mg total) by mouth daily with lunch. 10/29/16  Yes Burns, Claudina Lick, MD  pregabalin (LYRICA) 150 MG capsule Take 1 capsule in morning and 2 capsules at night. Patient taking differently: Take 150-300 mg by mouth 2 (two) times daily. Take 1 capsule in morning and 2 capsules at night. 06/10/17  Yes Jaffe, Adam R, DO  rosuvastatin (CRESTOR) 20 MG tablet Take 1 tablet (20 mg total) by mouth daily. 07/18/17  Yes Burns, Claudina Lick, MD  traMADol (ULTRAM) 50 MG tablet Take 1 tablet (50 mg total) by mouth every 4 (four) hours as needed. 06/10/17  Yes Pieter Partridge, DO    Inpatient Medications: Scheduled Meds: . aspirin  81 mg Oral Daily  . heparin  5,000 Units Subcutaneous Q8H  . insulin aspart  0-15 Units Subcutaneous TID AC & HS  . scopolamine  1 patch Transdermal Q72H   Continuous Infusions: . sodium chloride    . cefTRIAXone (ROCEPHIN)  IV Stopped (10/28/17 1127)  . dextrose 5% lactated ringers 1,000 mL (10/28/17 1209)   PRN Meds: sodium chloride  Allergies:    Allergies  Allergen Reactions  . Bee Venom Anaphylaxis  . Beta Adrenergic Blockers Other (See Comments)    Bradycardia with carvedilol  . Carvedilol Other (See Comments)    Bradycardia with all Beta  blockers   . Influenza Vaccines Other (See Comments)    Sick for months  . Atorvastatin     Severe pain in stumps    Social History:   Social History   Socioeconomic History  . Marital status: Married    Spouse name: Not on file  . Number  of children: 2  . Years of education: Not on file  . Highest education level: Not on file  Occupational History  . Occupation: disable    Employer: RETIRED  Social Needs  . Financial resource strain: Not on file  . Food insecurity:    Worry: Not on file    Inability: Not on file  . Transportation needs:    Medical: Not on file    Non-medical: Not on file  Tobacco Use  . Smoking status: Former Smoker    Years: 50.00    Last attempt to quit: 07/22/2012    Years since quitting: 5.2  . Smokeless tobacco: Current User  . Tobacco comment: pt states that he is using E-cigs  Substance and Sexual Activity  . Alcohol use: No    Alcohol/week: 0.0 oz  . Drug use: No  . Sexual activity: Never    Comment: electronic cigarettes no nicotene  Lifestyle  . Physical activity:    Days per week: Not on file    Minutes per session: Not on file  . Stress: Not on file  Relationships  . Social connections:    Talks on phone: Not on file    Gets together: Not on file    Attends religious service: Not on file    Active member of club or organization: Not on file    Attends meetings of clubs or organizations: Not on file    Relationship status: Not on file  . Intimate partner violence:    Fear of current or ex partner: Not on file    Emotionally abused: Not on file    Physically abused: Not on file    Forced sexual activity: Not on file  Other Topics Concern  . Not on file  Social History Narrative   Lives w/ wife    Family History:    Family History  Problem Relation Age of Onset  . Heart attack Mother 25  . Heart disease Mother   . Stroke Mother   . Hyperlipidemia Mother   . Hypertension Mother   . COPD Father   . Peripheral vascular  disease Father   . Diabetes Brother   . Heart disease Brother   . Hypertension Brother   . Heart attack Brother   . Diabetes Daughter   . Colon cancer Neg Hx   . Prostate cancer Neg Hx      ROS:  Please see the history of present illness.   All other ROS reviewed and negative.     Physical Exam/Data:   Vitals:   10/28/17 1145 10/28/17 1200 10/28/17 1300 10/28/17 1400  BP:  (!) 150/108 (!) 153/86 (!) 171/93  Pulse:  98 89 96  Resp:  (!) 25 (!) 25 (!) 28  Temp: 99.7 F (37.6 C)     TempSrc: Oral     SpO2:  (!) 82% 98% 95%  Weight:      Height:        Intake/Output Summary (Last 24 hours) at 10/28/2017 1449 Last data filed at 10/28/2017 1400 Gross per 24 hour  Intake 4895.62 ml  Output 650 ml  Net 4245.62 ml   Filed Weights   10/27/17 1651 10/27/17 2200 10/28/17 0500  Weight: 160 lb (72.6 kg) 135 lb 2.3 oz (61.3 kg) 139 lb 12.4 oz (63.4 kg)   Body mass index is 19.49 kg/m.  General:  Well nourished, well developed, in no acute distress HEENT: normal Neck: no JVD Vascular: No carotid bruits Cardiac:  Irregular rhythm, regular rate, no murmur Lungs:  Respirations unlabored, coarse sounds in bases Abd: soft, nontender, no hepatomegaly  Ext: no edema, bilateral BKA Musculoskeletal:  Bilateral BKA Skin: warm and dry  Neuro:  CNs 2-12 intact, no focal abnormalities noted Psych:  Normal affect   EKG:  The EKG was personally reviewed and demonstrates:  AV conduction abnormality Telemetry:  Telemetry was personally reviewed and demonstrates:  AV conduction abnormality  Relevant CV Studies:  Echo pending  Echo 08/14/15: Study Conclusions - Left ventricle: The cavity size was normal. Systolic function was   moderately reduced. The estimated ejection fraction was 35%.   Diffuse hypokinesis worse in the inferior and inferolateral   regions. Doppler parameters are consistent with abnormal left   ventricular relaxation (grade 1 diastolic dysfunction). - Mitral valve:  There was mild regurgitation. - Right ventricle: The cavity size was normal. Wall thickness was   normal. Systolic function was mildly reduced. - Tricuspid valve: There was mild regurgitation. - Inferior vena cava: The vessel was normal in size. The   respirophasic diameter changes were in the normal range (>= 50%),   consistent with normal central venous pressure.  Laboratory Data:  Chemistry Recent Labs  Lab 10/27/17 1614 10/28/17 0333  NA 144 141  K 2.8* 4.8  CL 105 116*  CO2 26 14*  GLUCOSE 69 87  BUN 21* 17  CREATININE 1.22 0.77  CALCIUM 7.9* 8.1*  GFRNONAA 56* >60  GFRAA >60 >60  ANIONGAP 13 11    Recent Labs  Lab 10/27/17 1614 10/28/17 0333  PROT 5.7* 5.9*  ALBUMIN 2.7* 2.8*  AST 274* 211*  ALT 312* 275*  ALKPHOS 148* 134*  BILITOT 4.0* 4.5*   Hematology Recent Labs  Lab 10/27/17 1614 10/28/17 0333  WBC 14.6* 10.2  RBC 4.76 5.05  HGB 12.2* 13.1  HCT 36.9* 39.8  MCV 77.5* 78.8  MCH 25.6* 25.9*  MCHC 33.1 32.9  RDW 17.0* 17.8*  PLT 140* 120*   Cardiac Enzymes Recent Labs  Lab 10/27/17 1614 10/27/17 2146 10/28/17 0333 10/28/17 0618  TROPONINI 0.09* 0.10* 0.24* 0.34*   No results for input(s): TROPIPOC in the last 168 hours.  BNPNo results for input(s): BNP, PROBNP in the last 168 hours.  DDimer No results for input(s): DDIMER in the last 168 hours.  Radiology/Studies:  US Abdomen Limited  Result Date: 10/27/2017 CLINICAL DATA:  Elevated liver function tests, febrile. History of cholecystectomy. EXAM: ULTRASOUND ABDOMEN LIMITED RIGHT UPPER QUADRANT COMPARISON:  CT abdomen and pelvis November 01, 2014 FINDINGS: Gallbladder: Status post cholecystectomy. No fluid collections within gallbladder fossa. Common bile duct: Diameter: 5 mm Stable anechoic 13 mm cyst LEFT lobe of the liver. Within normal limits in parenchymal echogenicity. Portal vein is patent on color Doppler imaging with normal direction of blood flow towards the liver. 16 mm anechoic cyst  upper pole RIGHT kidney with acoustic enhancement. IMPRESSION: 1. No acute process in the RIGHT upper quadrant; status post cholecystectomy. Electronically Signed   By: Elon Alas M.D.   On: 10/27/2017 19:21   Dg Chest Port 1 View  Result Date: 10/27/2017 CLINICAL DATA:  Bradycardia.  Sepsis and fever. EXAM: PORTABLE CHEST 1 VIEW COMPARISON:  07/08/2016 FINDINGS: Normal heart size. No pleural effusion or edema. No airspace opacities identified. Review of the visualized osseous structures is unremarkable. IMPRESSION: 1. No acute cardiopulmonary abnormality. Electronically Signed   By: Kerby Moors M.D.   On: 10/27/2017 18:45    Assessment and Plan:   1.  Elevated troponin, 2nd degree heart block, conduction disease with bundle branch block - troponin found to be elevated 0.09 --> 0.10 --> 0.24 --> 0.34 --> 0.44 - EKG with first degree heart block, repeat EKG with progression of AV conduction abnormality compared to prior - avoid beta blockers and calcium channel blockers  - elevated troponin and changes in EKG likely related to sepsis; however, he has known CAD. The patient denies anginal symptoms. Troponin is likely demand ischemia.   2. Ischemic cardiomyopathy, chronic systolic and diastolic heart failure - pt is not volume overloaded - continue lasix - on IVF for sepsis - would be conservative with fluids given his EF of 35%     For questions or updates, please contact [facility] Please consult [email or web address] for contact info under Cardiology/STEMI.   Signed, Tami Lin [place], [place]  10/28/2017 2:49 PM

## 2017-10-28 NOTE — Progress Notes (Signed)
Patient has fever 100.7 tonight.Dr.Sommer notified .New orders  Received and carried out.

## 2017-10-28 NOTE — Plan of Care (Signed)
  Problem: Education: Goal: Knowledge of General Education information will improve Outcome: Progressing   Problem: Health Behavior/Discharge Planning: Goal: Ability to manage health-related needs will improve Outcome: Progressing   Problem: Clinical Measurements: Goal: Ability to maintain clinical measurements within normal limits will improve Outcome: Progressing   Problem: Clinical Measurements: Goal: Diagnostic test results will improve Outcome: Progressing   Problem: Pain Managment: Goal: General experience of comfort will improve Outcome: Progressing   Problem: Safety: Goal: Ability to remain free from injury will improve Outcome: Progressing

## 2017-10-28 NOTE — Progress Notes (Signed)
PHARMACY - PHYSICIAN COMMUNICATION CRITICAL VALUE ALERT - BLOOD CULTURE IDENTIFICATION (BCID)  Jaime [name] is an 77 y.o. male who presented to [facility] on 10/27/2017 with a chief complaint of shock (mixed septic and cardiogenic).   Assessment:  Klebsiella bacteremia with likely source of urine. History of Klebsiella UTI in past that was pan sensitive. Responding well to Zosyn.   Name of physician (or Provider) Contacted: Marni Griffon, NP CCM  Current antibiotics: Zosyn  Changes to prescribed antibiotics recommended:  Change Zosyn to Ceftriaxone 2g IV every 24 hours.   Results for orders placed or performed during the hospital encounter of 10/27/17  Blood Culture ID Panel (Reflexed) (Collected: 10/27/2017  4:56 PM)  Result Value Ref Range   Enterococcus species NOT DETECTED NOT DETECTED   Listeria monocytogenes NOT DETECTED NOT DETECTED   Staphylococcus species NOT DETECTED NOT DETECTED   Staphylococcus aureus NOT DETECTED NOT DETECTED   Streptococcus species NOT DETECTED NOT DETECTED   Streptococcus agalactiae NOT DETECTED NOT DETECTED   Streptococcus pneumoniae NOT DETECTED NOT DETECTED   Streptococcus pyogenes NOT DETECTED NOT DETECTED   Acinetobacter baumannii NOT DETECTED NOT DETECTED   Enterobacteriaceae species DETECTED (A) NOT DETECTED   Enterobacter cloacae complex NOT DETECTED NOT DETECTED   Escherichia coli NOT DETECTED NOT DETECTED   Klebsiella oxytoca NOT DETECTED NOT DETECTED   Klebsiella pneumoniae DETECTED (A) NOT DETECTED   Proteus species NOT DETECTED NOT DETECTED   Serratia marcescens NOT DETECTED NOT DETECTED   Carbapenem resistance NOT DETECTED NOT DETECTED   Haemophilus influenzae NOT DETECTED NOT DETECTED   Neisseria meningitidis NOT DETECTED NOT DETECTED   Pseudomonas aeruginosa NOT DETECTED NOT DETECTED   Candida albicans NOT DETECTED NOT DETECTED   Candida glabrata NOT DETECTED NOT DETECTED   Candida krusei NOT DETECTED NOT DETECTED   Candida  parapsilosis NOT DETECTED NOT DETECTED   Candida tropicalis NOT DETECTED NOT DETECTED    Brain Hilts 10/28/2017  10:14 AM

## 2017-10-28 NOTE — Progress Notes (Signed)
CRITICAL VALUE ALERT  Critical Value:  Troponin 0.24  Date & Time Notied:  10/28/2017 0545  Provider Notified: Dr. Oletta Darter  Orders Received/Actions taken: repeat troponin and oral 81mg  ASA ordered

## 2017-10-28 NOTE — Progress Notes (Signed)
eLink Physician-Brief Progress Note Patient Name: Jaime [name] DOB: May 05, 1941 MRN: [id number]   Date of Service  10/28/2017  HPI/Events of Note  Fever to 100.7 F - Patient is already on Rocephin. AST and ALT both elevated, therefore, can't use Tylenol.   eICU Interventions  Will order: 1. Motrin 400 mg PO Q 6 hours PRN Temp > 100.5 F.     Intervention Category Major Interventions: Other:  Babette Stum Cornelia Copa 10/28/2017, 8:22 PM

## 2017-10-29 ENCOUNTER — Inpatient Hospital Stay (HOSPITAL_COMMUNITY): Payer: Medicare Other

## 2017-10-29 DIAGNOSIS — I361 Nonrheumatic tricuspid (valve) insufficiency: Secondary | ICD-10-CM

## 2017-10-29 DIAGNOSIS — I25118 Atherosclerotic heart disease of native coronary artery with other forms of angina pectoris: Secondary | ICD-10-CM

## 2017-10-29 LAB — TROPONIN I
Troponin I: 1.12 ng/mL (ref <0.03)
Troponin I: 1.29 ng/mL (ref <0.03)
Troponin I: 0.7 ng/mL (ref <0.03)

## 2017-10-29 LAB — GLUCOSE, CAPILLARY
GLUCOSE-CAPILLARY: 111 mg/dL — AB (ref 65–99)
GLUCOSE-CAPILLARY: 145 mg/dL — AB (ref 65–99)
Glucose-Capillary: 157 mg/dL — ABNORMAL HIGH (ref 65–99)
Glucose-Capillary: 176 mg/dL — ABNORMAL HIGH (ref 65–99)

## 2017-10-29 LAB — BASIC METABOLIC PANEL
Anion gap: 9 (ref 5–15)
Anion gap: 10 (ref 5–15)
BUN: 11 mg/dL (ref 6–20)
BUN: 8 mg/dL (ref 6–20)
CALCIUM: 7.8 mg/dL — AB (ref 8.9–10.3)
CALCIUM: 8 mg/dL — AB (ref 8.9–10.3)
CHLORIDE: 109 mmol/L (ref 101–111)
CO2: 17 mmol/L — AB (ref 22–32)
CO2: 16 mmol/L — AB (ref 22–32)
CREATININE: 0.57 mg/dL — AB (ref 0.61–1.24)
CREATININE: 0.58 mg/dL — AB (ref 0.61–1.24)
Chloride: 111 mmol/L (ref 101–111)
GFR calc Af Amer: >60 mL/min (ref >60)
GFR calc non Af Amer: >60 mL/min (ref >60)
GFR calc non Af Amer: >60 mL/min (ref >60)
GLUCOSE: 159 mg/dL — AB (ref 65–99)
GLUCOSE: 213 mg/dL — AB (ref 65–99)
Potassium: 5.2 mmol/L — ABNORMAL HIGH (ref 3.5–5.1)
Potassium: 4.8 mmol/L (ref 3.5–5.1)
Sodium: 135 mmol/L (ref 135–145)
Sodium: 137 mmol/L (ref 135–145)

## 2017-10-29 LAB — CBC
HEMATOCRIT: 34.1 % — AB (ref 39.0–52.0)
HEMOGLOBIN: 11.3 g/dL — AB (ref 13.0–17.0)
MCH: 26 pg (ref 26.0–34.0)
MCHC: 33.1 g/dL (ref 30.0–36.0)
MCV: 78.4 fL (ref 78.0–100.0)
Platelets: 107 10*3/uL — ABNORMAL LOW (ref 150–400)
RBC: 4.35 MIL/uL (ref 4.22–5.81)
RDW: 18.2 % — ABNORMAL HIGH (ref 11.5–15.5)
WBC: 6.8 10*3/uL (ref 4.0–10.5)

## 2017-10-29 LAB — ECHOCARDIOGRAM COMPLETE
Height: 71 in
Weight: 2299.84 oz

## 2017-10-29 LAB — PROCALCITONIN: PROCALCITONIN: 2.09 ng/mL

## 2017-10-29 MED ORDER — BACLOFEN 5 MG HALF TABLET
5.0000 mg | ORAL_TABLET | Freq: Two times a day (BID) | ORAL | Status: DC
  Administered 2017-10-29 (×2): 5 mg via ORAL
  Filled 2017-10-29 (×3): qty 1

## 2017-10-29 MED ORDER — TRAMADOL HCL 50 MG PO TABS
50.0000 mg | ORAL_TABLET | Freq: Four times a day (QID) | ORAL | Status: DC | PRN
  Administered 2017-10-30: 50 mg via ORAL
  Filled 2017-10-29: qty 1

## 2017-10-29 MED ORDER — PREGABALIN 75 MG PO CAPS
75.0000 mg | ORAL_CAPSULE | Freq: Two times a day (BID) | ORAL | Status: DC
  Administered 2017-10-29 (×2): 75 mg via ORAL
  Filled 2017-10-29 (×2): qty 1

## 2017-10-29 MED ORDER — INSULIN ASPART 100 UNIT/ML ~~LOC~~ SOLN
0.0000 [IU] | Freq: Three times a day (TID) | SUBCUTANEOUS | Status: DC
Start: 2017-10-29 — End: 2017-11-01
  Administered 2017-10-29 – 2017-10-31 (×2): 2 [IU] via SUBCUTANEOUS
  Administered 2017-10-31 (×2): 1 [IU] via SUBCUTANEOUS

## 2017-10-29 MED ORDER — ALBUTEROL SULFATE (2.5 MG/3ML) 0.083% IN NEBU: 2.5000 mg | INHALATION_SOLUTION | Freq: Four times a day (QID) | RESPIRATORY_TRACT | Status: DC | PRN

## 2017-10-29 MED ORDER — INSULIN ASPART 100 UNIT/ML ~~LOC~~ SOLN
0.0000 [IU] | Freq: Every day | SUBCUTANEOUS | Status: DC
Start: 2017-10-29 — End: 2017-11-01

## 2017-10-29 MED ORDER — ROSUVASTATIN CALCIUM 20 MG PO TABS
20.0000 mg | ORAL_TABLET | Freq: Every day | ORAL | Status: DC
  Administered 2017-10-29 – 2017-10-31 (×3): 20 mg via ORAL
  Filled 2017-10-29 (×3): qty 1

## 2017-10-29 MED ORDER — PANTOPRAZOLE SODIUM 40 MG PO TBEC
40.0000 mg | DELAYED_RELEASE_TABLET | Freq: Every day | ORAL | Status: DC
  Administered 2017-10-29 – 2017-10-31 (×3): 40 mg via ORAL
  Filled 2017-10-29 (×5): qty 1

## 2017-10-29 MED ORDER — ASPIRIN EC 81 MG PO TBEC
81.0000 mg | DELAYED_RELEASE_TABLET | Freq: Every day | ORAL | Status: DC
  Administered 2017-10-30 – 2017-11-01 (×3): 81 mg via ORAL
  Filled 2017-10-29 (×3): qty 1

## 2017-10-29 MED ORDER — AZELASTINE HCL 0.1 % NA SOLN
2.0000 | Freq: Every evening | NASAL | Status: DC | PRN
  Filled 2017-10-29: qty 30

## 2017-10-29 NOTE — Progress Notes (Addendum)
The patient has been seen in conjunction with Reino Bellis, NP. All aspects of care have been considered and discussed. The patient has been personally interviewed, examined, and all clinical data has been reviewed.   No cardiac complaints.  Positive for Klebsiella bacteremia.  Official interpretation of echo is still pending.  Patient has EF in the 30 -35% range with inferobasal akinesis which is very similar to prior report.  Cardiac markers have remained persistently elevated increasing from 0.34-1.29 today (total of 5 determinations).  This patient is known to have underlying coronary disease.  Coronary circulation has been stressed by the development of sepsis and hypotension.  This has led to an increase in cardiac markers which I believe are secondary to his presenting clinical syndrome, which is sepsis.  Recommend conservative medical management in this setting.  Given comorbidities, the most prudent approach from cardiac standpoint is medical therapy.  Progress Note  Patient Name: Jaime [name] Date of Encounter: 10/29/2017  Primary Cardiologist: [name], MD  Subjective   Feeling well this morning.   Inpatient Medications    Scheduled Meds: . aspirin  81 mg Oral Daily  . baclofen  5 mg Oral BID  . heparin  5,000 Units Subcutaneous Q8H  . insulin aspart  0-15 Units Subcutaneous TID AC & HS  . pregabalin  75 mg Oral BID   Continuous Infusions: . cefTRIAXone (ROCEPHIN)  IV 2 g (10/29/17 0945)  . dextrose 5% lactated ringers 50 mL/hr at 10/29/17 0945   PRN Meds: ibuprofen   Vital Signs    Vitals:   10/28/17 2000 10/28/17 2217 10/29/17 0457 10/29/17 0501  BP: (!) 153/78 130/85 (!) 154/78   Pulse: 89 79 76   Resp: (!) 24 18    Temp:  98.4 F (36.9 C) 98 F (36.7 C)   TempSrc:  Oral Oral   SpO2: 98% 98%    Weight:  148 lb 9.4 oz (67.4 kg)  143 lb 11.8 oz (65.2 kg)  Height:        Intake/Output Summary (Last 24 hours) at 10/29/2017 1105 Last  data filed at 10/29/2017 0945 Gross per 24 hour  Intake 1320 ml  Output 950 ml  Net 370 ml   Filed Weights   10/28/17 0500 10/28/17 2217 10/29/17 0501  Weight: 139 lb 12.4 oz (63.4 kg) 148 lb 9.4 oz (67.4 kg) 143 lb 11.8 oz (65.2 kg)    Telemetry    SR with 1st degree AVB, PACs - Personally Reviewed  ECG    N/a - Personally Reviewed  Physical Exam   General: Well developed, well nourished, male appearing in no acute distress. Head: Normocephalic, atraumatic.  Neck: Supple without bruits, JVD. Lungs:  Resp regular and unlabored, CTA. Heart: Irreg rhythm, S1, S2, no murmur; no rub. Abdomen: Soft, non-tender, non-distended with normoactive bowel sounds. Extremities: Bilateral BKA Neuro: Alert and oriented X 3. Psych: Normal affect.  Labs    Chemistry Recent Labs  Lab 10/27/17 1614 10/28/17 0333 10/29/17 0220  NA 144 141 135  K 2.8* 4.8 5.2*  CL 105 116* 109  CO2 26 14* 17*  GLUCOSE 69 87 159*  BUN 21* 17 11  CREATININE 1.22 0.77 0.57*  CALCIUM 7.9* 8.1* 7.8*  PROT 5.7* 5.9*  --   ALBUMIN 2.7* 2.8*  --   AST 274* 211*  --   ALT 312* 275*  --   ALKPHOS 148* 134*  --   BILITOT 4.0* 4.5*  --   GFRNONAA 56* >  60 >60  GFRAA >60 >60 >60  ANIONGAP 13 11 9      Hematology Recent Labs  Lab 10/27/17 1614 10/28/17 0333 10/29/17 0742  WBC 14.6* 10.2 6.8  RBC 4.76 5.05 4.35  HGB 12.2* 13.1 11.3*  HCT 36.9* 39.8 34.1*  MCV 77.5* 78.8 78.4  MCH 25.6* 25.9* 26.0  MCHC 33.1 32.9 33.1  RDW 17.0* 17.8* 18.2*  PLT 140* 120* 107*    Cardiac Enzymes Recent Labs  Lab 10/28/17 1256 10/28/17 1920 10/29/17 0220 10/29/17 0742  TROPONINI 0.44* 1.03* 1.12* 1.29*   No results for input(s): TROPIPOC in the last 168 hours.   BNPNo results for input(s): BNP, PROBNP in the last 168 hours.   DDimer No results for input(s): DDIMER in the last 168 hours.    Radiology    US Abdomen Limited  Result Date: 10/27/2017 CLINICAL DATA:  Elevated liver function tests, febrile.  History of cholecystectomy. EXAM: ULTRASOUND ABDOMEN LIMITED RIGHT UPPER QUADRANT COMPARISON:  CT abdomen and pelvis November 01, 2014 FINDINGS: Gallbladder: Status post cholecystectomy. No fluid collections within gallbladder fossa. Common bile duct: Diameter: 5 mm Stable anechoic 13 mm cyst LEFT lobe of the liver. Within normal limits in parenchymal echogenicity. Portal vein is patent on color Doppler imaging with normal direction of blood flow towards the liver. 16 mm anechoic cyst upper pole RIGHT kidney with acoustic enhancement. IMPRESSION: 1. No acute process in the RIGHT upper quadrant; status post cholecystectomy. Electronically Signed   By: Elon Alas M.D.   On: 10/27/2017 19:21   Dg Chest Port 1 View  Result Date: 10/27/2017 CLINICAL DATA:  Bradycardia.  Sepsis and fever. EXAM: PORTABLE CHEST 1 VIEW COMPARISON:  07/08/2016 FINDINGS: Normal heart size. No pleural effusion or edema. No airspace opacities identified. Review of the visualized osseous structures is unremarkable. IMPRESSION: 1. No acute cardiopulmonary abnormality. Electronically Signed   By: Kerby Moors M.D.   On: 10/27/2017 18:45    Cardiac Studies   TTE: pending  Patient Profile     77 y.o. male with a hx of chronic systolic and diastolic heart failure, ischemic cardiomyopathy, CAD s/p PCI to LCx (2007), MS, DM, s/p bilateral BKA, s/p cholecystectomy (2016), current smoker, PVD, and fem-tib bypass (2013) who was seen in relation to an elevated troponin.   Assessment & Plan    1. Elevated Troponin: peaking at 1.29 thus far. He continues to deny any chest pain. It is felt that in the setting of sepsis and hypotension this troponin is related to demand ischemia. Echo pending, but does have a known EF of 35% with diffuse hypokinesis.   2. ICM: does not appear volume overloaded on exam. Follow volume status closely with IVFs.   3. Shock/sepsis: Appears to have resolved. Remains on antibiotic therapy  4. 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 Remains stable on telemetry, on no AV nodal blocking agents.   Signed, Reino Bellis, NP  10/29/2017, 11:05 AM  Pager # [phone]   For questions or updates, please contact [place] Please consult [email or web address] for contact info under Cardiology/STEMI.

## 2017-10-29 NOTE — Plan of Care (Signed)
  Problem: Health Behavior/Discharge Planning: Goal: Ability to manage health-related needs will improve Outcome: Progressing   Problem: Pain Managment: Goal: General experience of comfort will improve Outcome: Progressing   Problem: Safety: Goal: Ability to remain free from injury will improve Outcome: Progressing   

## 2017-10-29 NOTE — Progress Notes (Signed)
Patient needs procalcitonin lab order (if needed) because lab did not get adequate sample.

## 2017-10-29 NOTE — Consult Note (Signed)
[place] Nurse wound consult note Reason for Consult: lesions on back Patient's wife reports these areas arise just before he "gets an infection"  They report they have "pus" then dry up. Does not follow a dermatone. Today the area is slightly pigmented, with dry edges. Not fluctuant, reports tenderness but I did not notice tenderness.  No new detergents, no new medications (topical or systemic) no new linens or changes in WC surface. No new foods or exposure to any irritants.  Wound type: intact skin with hyperpigmentation noted just over the lumbar spine, along the spine. Pressure Injury POA:NA Wound bed:see above Drainage (amount, consistency, odor) none, patient and wife report "pus" before they dry up to current state Periwound:intact  Dressing procedure/placement/frequency: No topical care needed.    Recommended outpatient follow up with dermatologist for work up.  Discussed POC with patient and bedside nurse.  Re consult if needed, will not follow at this time. Thanks  Mikaele Stecher [employer], RN,CWOCN, CNS, [place] [phone])

## 2017-10-29 NOTE — Progress Notes (Signed)
PT Cancellation Note  Patient Details Name: Jaime [name] MRN: [id number] DOB: Jan 01, 1941   Cancelled Treatment:    Reason Eval/Treat Not Completed: Patient not medically ready. Pt with elevating troponins. Will follow-up for PT evaluation when medically appropriate.  Mabeline Caras, PT, DPT Acute Rehab Services  Pager: [place] 10/29/2017, 10:40 AM

## 2017-10-29 NOTE — Progress Notes (Signed)
  Echocardiogram 2D Echocardiogram has been performed.  Shakirah Kirkey L Androw 10/29/2017, 8:53 AM

## 2017-10-29 NOTE — Progress Notes (Signed)
Patient has removed both IV's ou at this time. Iv team called for new placement.

## 2017-10-29 NOTE — Progress Notes (Signed)
[facility] Progress Note  Patient: Jaime [name] SNK:[id number]   PCP: [name], MD DOB: [birth year]   DOA: 10/27/2017   DOS: 10/29/2017   Date of Service: the patient was seen and examined on 10/29/2017  Subjective: Feeling better, no cough.  Some pain in his left stump.  Currently resolved.  Mentation back to baseline  Brief hospital course: Pt. with PMH of HFrEF 93% ( diastolic and systolic dysfn), MS, DM, s/p bilat BKA s/p cholecystectomy in 2016, +tobacco h/o, PVD, CAD and FemTib bypass in 2013; admitted on 10/27/2017, presented with complaint of confusion, unresponsive episode, sharp, was found to have Klebsiella bacteremia sepsis. Currently further plan is continue IV antibiotics.  Assessment and Plan: 1.  Septic shock due to urinary tract infection. Klebsiella bacteremia. Blood cultures 1 out of 2 positive for Klebsiella. 1 out of 2 also positive for coagulase-negative staph-likely contamination. Urine culture shows multiple species. Admitted to ICU started on IV fluids required levo fed. Now off the pressors and on the floor. Continue gentle IV hydration, continue IV ceftriaxone. Awaiting sensitivities.  2.  Hypertension Dyslipidemia Elevated troponin, CAD Chronic systolic CHF, echocardiogram shows 35% EF. Continue monitoring on telemetry. Cardiology consulted. Troponin now trending downwards. Echocardiogram performed, results pending. Neurology feels this appears to be demand ischemia from sepsis. Medical conservative treatment recommended for now. Continue aspirin. Resume statin tomorrow.  3.  Bilateral AKA. Phantom pain. Resuming baclofen as well as Lyrica per home dose.  4.  Elevated LFT. Ultrasound negative for any acute abnormality. Now trending down. Monitor.  5.  Non-anion gap metabolic acidosis. Most likely after normal saline resuscitation. Monitor.  6.  Thrombocytopenia. Acute in the setting of sepsis. Monitor. No evidence of active  bleeding for now.  Diet: Cardiac diet DVT Prophylaxis: subcutaneous Heparin  Advance goals of care discussion: Full code  Family Communication: no family was present at bedside, at the time of interview.   Disposition:  Discharge to home.  PT   Consultants: PCCM, cardiology Procedures: Echocardiogram   Antibiotics: Anti-infectives (From admission, onward)   Start     Dose/Rate Route Frequency Ordered Stop   10/28/17 2300  piperacillin-tazobactam (ZOSYN) IVPB 3.375 g  Status:  Discontinued     3.375 g 12.5 mL/hr over 240 Minutes Intravenous Every 8 hours 10/27/17 2108 10/28/17 1014   10/28/17 1015  cefTRIAXone (ROCEPHIN) 2 g in sodium chloride 0.9 % 100 mL IVPB     2 g 200 mL/hr over 30 Minutes Intravenous Every 24 hours 10/28/17 1014     10/28/17 0600  vancomycin (VANCOCIN) IVPB 750 mg/150 ml premix  Status:  Discontinued     750 mg 150 mL/hr over 60 Minutes Intravenous Every 12 hours 10/27/17 2108 10/27/17 2110   10/27/17 1730  vancomycin (VANCOCIN) 1,500 mg in sodium chloride 0.9 % 500 mL IVPB     1,500 mg 250 mL/hr over 120 Minutes Intravenous  Once 10/27/17 1657 10/27/17 2300   10/27/17 1700  piperacillin-tazobactam (ZOSYN) IVPB 3.375 g     3.375 g 100 mL/hr over 30 Minutes Intravenous  Once 10/27/17 1647 10/27/17 1735   10/27/17 1700  vancomycin (VANCOCIN) IVPB 1000 mg/200 mL premix  Status:  Discontinued     1,000 mg 200 mL/hr over 60 Minutes Intravenous  Once 10/27/17 1647 10/27/17 1657       Objective: Physical Exam: Vitals:   10/28/17 2217 10/29/17 0457 10/29/17 0501 10/29/17 1209  BP: 130/85 (!) 154/78  (!) 153/93  Pulse: 79 76  72  Resp: 18     Temp: 98.4 F (36.9 C) 98 F (36.7 C)  98.3 F (36.8 C)  TempSrc: Oral Oral  Oral  SpO2: 98%   98%  Weight: 67.4 kg (148 lb 9.4 oz)  65.2 kg (143 lb 11.8 oz)   Height:        Intake/Output Summary (Last 24 hours) at 10/29/2017 1753 Last data filed at 10/29/2017 1641 Gross per 24 hour  Intake 1077.5 ml    Output 950 ml  Net 127.5 ml   Filed Weights   10/28/17 0500 10/28/17 2217 10/29/17 0501  Weight: 63.4 kg (139 lb 12.4 oz) 67.4 kg (148 lb 9.4 oz) 65.2 kg (143 lb 11.8 oz)   General: Alert, Awake and Oriented to Time, Place and Person. Appear in mild distress, affect appropriate Eyes: PERRL, Conjunctiva normal ENT: Oral Mucosa clear moist. Neck: no JVD, no Abnormal Mass Or lumps Cardiovascular: S1 and S2 Present, no Murmur, Peripheral Pulses Present Respiratory: normal respiratory effort, Bilateral Air entry equal and Decreased, no use of accessory muscle, Clear to Auscultation, no Crackles, no wheezes Abdomen: Bowel Sound present, Soft and no tenderness, no hernia Skin: no redness, on Rash, no induration Extremities: bilateral AKA Neurologic: Grossly no focal neuro deficit. Bilaterally Equal motor strength  Data Reviewed: CBC: Recent Labs  Lab 10/27/17 1614 10/28/17 0333 10/29/17 0742  WBC 14.6* 10.2 6.8  HGB 12.2* 13.1 11.3*  HCT 36.9* 39.8 34.1*  MCV 77.5* 78.8 78.4  PLT 140* 120* 151*   Basic Metabolic Panel: Recent Labs  Lab 10/27/17 1614 10/27/17 1909 10/28/17 0333 10/29/17 0220  NA 144  --  141 135  K 2.8*  --  4.8 5.2*  CL 105  --  116* 109  CO2 26  --  14* 17*  GLUCOSE 69  --  87 159*  BUN 21*  --  17 11  CREATININE 1.22  --  0.77 0.57*  CALCIUM 7.9*  --  8.1* 7.8*  MG  --  0.8* 2.4  --   PHOS  --   --  2.4*  --     Liver Function Tests: Recent Labs  Lab 10/27/17 1614 10/28/17 0333  AST 274* 211*  ALT 312* 275*  ALKPHOS 148* 134*  BILITOT 4.0* 4.5*  PROT 5.7* 5.9*  ALBUMIN 2.7* 2.8*   No results for input(s): LIPASE, AMYLASE in the last 168 hours. No results for input(s): AMMONIA in the last 168 hours. Coagulation Profile: No results for input(s): INR, PROTIME in the last 168 hours. Cardiac Enzymes: Recent Labs  Lab 10/28/17 1256 10/28/17 1920 10/29/17 0220 10/29/17 0742 10/29/17 1500  TROPONINI 0.44* 1.03* 1.12* 1.29* 0.70*   BNP  (last 3 results) No results for input(s): PROBNP in the last 8760 hours. CBG: Recent Labs  Lab 10/28/17 1608 10/28/17 2145 10/29/17 0807 10/29/17 1207 10/29/17 1615  GLUCAP 150* 174* 111* 145* 157*   Studies: No results found.  Scheduled Meds: . aspirin EC  81 mg Oral Daily  . baclofen  5 mg Oral BID  . insulin aspart  0-5 Units Subcutaneous QHS  . insulin aspart  0-9 Units Subcutaneous TID WC  . pantoprazole  40 mg Oral Q lunch  . pregabalin  75 mg Oral BID  . rosuvastatin  20 mg Oral q1800   Continuous Infusions: . cefTRIAXone (ROCEPHIN)  IV Stopped (10/29/17 1015)   PRN Meds: albuterol, azelastine, traMADol  Time spent: 35 minutes  Author: Berle Mull, MD [facility] Pager: [phone] 10/29/2017 5:53  PM  If 7PM-7AM, please contact night-coverage at [email or web address], password [facility]

## 2017-10-30 ENCOUNTER — Other Ambulatory Visit: Payer: Self-pay

## 2017-10-30 ENCOUNTER — Inpatient Hospital Stay (HOSPITAL_COMMUNITY): Payer: Medicare Other

## 2017-10-30 DIAGNOSIS — I471 Supraventricular tachycardia: Secondary | ICD-10-CM

## 2017-10-30 LAB — CBC WITH DIFFERENTIAL/PLATELET
Basophils Absolute: 0 K/uL (ref 0.0–0.1)
Basophils Relative: 0 %
Eosinophils Absolute: 0.2 K/uL (ref 0.0–0.7)
Eosinophils Relative: 3 %
HCT: 37.6 % — ABNORMAL LOW (ref 39.0–52.0)
Hemoglobin: 13 g/dL (ref 13.0–17.0)
Lymphocytes Relative: 21 %
Lymphs Abs: 1 K/uL (ref 0.7–4.0)
MCH: 26.6 pg (ref 26.0–34.0)
MCHC: 34.6 g/dL (ref 30.0–36.0)
MCV: 77 fL — ABNORMAL LOW (ref 78.0–100.0)
Monocytes Absolute: 0.4 K/uL (ref 0.1–1.0)
Monocytes Relative: 8 %
Neutro Abs: 3.1 K/uL (ref 1.7–7.7)
Neutrophils Relative %: 68 %
Platelets: 115 K/uL — ABNORMAL LOW (ref 150–400)
RBC: 4.88 MIL/uL (ref 4.22–5.81)
RDW: 18 % — ABNORMAL HIGH (ref 11.5–15.5)
WBC: 4.6 K/uL (ref 4.0–10.5)

## 2017-10-30 LAB — URINALYSIS, COMPLETE (UACMP) WITH MICROSCOPIC
Bacteria, UA: NONE SEEN
Bilirubin Urine: NEGATIVE
Glucose, UA: NEGATIVE mg/dL
KETONES UR: 20 mg/dL — AB
Leukocytes, UA: NEGATIVE
Nitrite: NEGATIVE
PROTEIN: NEGATIVE mg/dL
SQUAMOUS EPITHELIAL / LPF: NONE SEEN
Specific Gravity, Urine: 1.009 (ref 1.005–1.030)
pH: 6 (ref 5.0–8.0)

## 2017-10-30 LAB — COMPREHENSIVE METABOLIC PANEL WITH GFR
ALT: 172 U/L — ABNORMAL HIGH (ref 17–63)
AST: 102 U/L — ABNORMAL HIGH (ref 15–41)
Albumin: 2.7 g/dL — ABNORMAL LOW (ref 3.5–5.0)
Alkaline Phosphatase: 128 U/L — ABNORMAL HIGH (ref 38–126)
Anion gap: 10 (ref 5–15)
BUN: <5 mg/dL — ABNORMAL LOW (ref 6–20)
CO2: 20 mmol/L — ABNORMAL LOW (ref 22–32)
Calcium: 8.1 mg/dL — ABNORMAL LOW (ref 8.9–10.3)
Chloride: 108 mmol/L (ref 101–111)
Creatinine, Ser: 0.64 mg/dL (ref 0.61–1.24)
GFR calc Af Amer: >60 mL/min
GFR calc non Af Amer: >60 mL/min
Glucose, Bld: 103 mg/dL — ABNORMAL HIGH (ref 65–99)
Potassium: 3.4 mmol/L — ABNORMAL LOW (ref 3.5–5.1)
Sodium: 138 mmol/L (ref 135–145)
Total Bilirubin: 2.1 mg/dL — ABNORMAL HIGH (ref 0.3–1.2)
Total Protein: 6.1 g/dL — ABNORMAL LOW (ref 6.5–8.1)

## 2017-10-30 LAB — GLUCOSE, CAPILLARY
GLUCOSE-CAPILLARY: 104 mg/dL — AB (ref 65–99)
Glucose-Capillary: 105 mg/dL — ABNORMAL HIGH (ref 65–99)
Glucose-Capillary: 94 mg/dL (ref 65–99)
Glucose-Capillary: 164 mg/dL — ABNORMAL HIGH (ref 65–99)

## 2017-10-30 LAB — CULTURE, BLOOD (ROUTINE X 2): Special Requests: ADEQUATE

## 2017-10-30 LAB — LACTIC ACID, PLASMA: LACTIC ACID, VENOUS: 1.7 mmol/L (ref 0.5–1.9)

## 2017-10-30 LAB — PROCALCITONIN: Procalcitonin: 1.02 ng/mL

## 2017-10-30 LAB — MAGNESIUM: Magnesium: 1.9 mg/dL (ref 1.7–2.4)

## 2017-10-30 MED ORDER — NICOTINE 14 MG/24HR TD PT24
14.0000 mg | MEDICATED_PATCH | Freq: Every day | TRANSDERMAL | Status: DC
  Administered 2017-10-30 – 2017-11-01 (×3): 14 mg via TRANSDERMAL
  Filled 2017-10-30 (×3): qty 1

## 2017-10-30 MED ORDER — ACETAMINOPHEN 325 MG PO TABS
650.0000 mg | ORAL_TABLET | Freq: Four times a day (QID) | ORAL | Status: DC | PRN
Start: 2017-10-30 — End: 2017-11-01

## 2017-10-30 MED ORDER — CEFTRIAXONE SODIUM 2 G IJ SOLR
2.0000 g | INTRAMUSCULAR | Status: DC
  Administered 2017-10-30 – 2017-11-01 (×3): 2 g via INTRAVENOUS
  Filled 2017-10-30 (×3): qty 20

## 2017-10-30 MED ORDER — PREGABALIN 25 MG PO CAPS
25.0000 mg | ORAL_CAPSULE | Freq: Every day | ORAL | Status: DC
  Administered 2017-10-31 – 2017-11-01 (×2): 25 mg via ORAL
  Filled 2017-10-30 (×2): qty 1

## 2017-10-30 MED ORDER — PREGABALIN 25 MG PO CAPS: 25.0000 mg | ORAL_CAPSULE | Freq: Two times a day (BID) | ORAL | Status: DC

## 2017-10-30 MED ORDER — HYDRALAZINE HCL 20 MG/ML IJ SOLN
10.0000 mg | INTRAMUSCULAR | Status: DC | PRN
  Administered 2017-10-31: 10 mg via INTRAVENOUS
  Filled 2017-10-30: qty 1

## 2017-10-30 MED ORDER — SODIUM CHLORIDE 0.9% FLUSH: 10.0000 mL | INTRAVENOUS | Status: DC | PRN

## 2017-10-30 MED ORDER — CEFDINIR 300 MG PO CAPS
300.0000 mg | ORAL_CAPSULE | Freq: Two times a day (BID) | ORAL | Status: DC
  Filled 2017-10-30: qty 1

## 2017-10-30 NOTE — Progress Notes (Signed)
PT Cancellation Note  Patient Details Name: Jaime [name] MRN: [id number] DOB: 1941/05/17   Cancelled Treatment:    Reason Eval/Treat Not Completed: PT screened, no needs identified, will sign off. Orders received; chart reviewed. Patient has been bed bound and dependent for greater than 3 years. No skilled PT needs at this time. Please re-consult if needs change.  Ellamae Sia, PT, DPT Acute Rehabilitation Services  Pager: [phone]    Willy Eddy 10/30/2017, 3:28 PM

## 2017-10-30 NOTE — Plan of Care (Signed)
  Problem: Clinical Measurements: Goal: Ability to maintain clinical measurements within normal limits will improve Outcome: Progressing   Problem: Clinical Measurements: Goal: Diagnostic test results will improve Outcome: Progressing   Problem: Nutrition: Goal: Adequate nutrition will be maintained Outcome: Progressing   Problem: Pain Managment: Goal: General experience of comfort will improve Outcome: Progressing   Problem: Safety: Goal: Ability to remain free from injury will improve Outcome: Progressing

## 2017-10-30 NOTE — Progress Notes (Signed)
Patient complains of "feet hurting" and also asking for cigarettes. RN explained that there are no cigarettes and gave pain medication that he says really helped. Patient has periods of worsening confusing at times.

## 2017-10-30 NOTE — Progress Notes (Signed)
[facility] Progress Note  Patient: Jaime [name] ZOX:[id number]   PCP: [name], MD DOB: [birth year]   DOA: 10/27/2017   DOS: 10/30/2017   Date of Service: the patient was seen and examined on 10/30/2017  Subjective: More confused this morning, had temp of 101.  No other acute complaint no nausea no vomiting.  No chest pain no cough.  No shortness of breath.  Brief hospital course: Pt. with PMH of HFrEF 81% ( diastolic and systolic dysfn), MS, DM, s/p bilat BKA s/p cholecystectomy in 2016, +tobacco h/o, PVD, CAD and FemTib bypass in 2013; admitted on 10/27/2017, presented with complaint of confusion, unresponsive episode, sharp, was found to have Klebsiella bacteremia sepsis. Currently further plan is continue IV antibiotics.  Assessment and Plan: 1.  Septic shock due to urinary tract infection. Klebsiella bacteremia. Recurrent fever. Blood cultures 1 out of 2 positive for Klebsiella. 1 out of 2 also positive for coagulase-negative staph-likely contamination. Urine culture shows multiple species. Admitted to ICU started on IV fluids required levo fed. Now off the pressors and on the floor. I will continue IV ceftriaxone for now. Repeat blood cultures performed, pro calcitonin level is not significantly elevated.  Chest x-ray has made a comment regarding possible left lower lobe pneumonia although x-ray is rotated therefore I do not think there is a pneumonia there. Will monitor.  2.  Hypertension Dyslipidemia Elevated troponin, CAD Chronic systolic CHF, echocardiogram shows 35% EF. Continue monitoring on telemetry. Cardiology consulted. Troponin now trending downwards. Echocardiogram performed, results pending. Neurology feels this appears to be demand ischemia from sepsis. Medical conservative treatment recommended for now. Continue aspirin. Resume statin tomorrow.  3.  Bilateral AKA. Phantom pain. I resumed patient's Lyrica, although we will reduce the dose due to  patient's encephalopathy. Discontinue baclofen for now.  4.  Elevated LFT. Ultrasound negative for any acute abnormality. Now trending down. Monitor.  5.  Non-anion gap metabolic acidosis. Most likely after normal patient's Benicar was resumed saline resuscitation. Monitor.  6.  Thrombocytopenia. Acute in the setting of sepsis. Monitor. No evidence of active bleeding for now.  7.  Acute metabolic encephalopathy with a component of toxic encephalopathy due to drug. Likely in the setting of fever, use of Lyrica and baclofen. Reducing the dose of Lyrica Discontinuing baclofen.  Fever subsided.  Monitor.  Diet: Cardiac diet DVT Prophylaxis: subcutaneous Heparin  Advance goals of care discussion: Full code  Family Communication: no family was present at bedside, at the time of interview.   Disposition:  Discharge to home.  PT   Consultants: PCCM, cardiology Procedures: Echocardiogram   Antibiotics: Anti-infectives (From admission, onward)   Start     Dose/Rate Route Frequency Ordered Stop   10/30/17 1000  cefdinir (OMNICEF) capsule 300 mg  Status:  Discontinued     300 mg Oral Every 12 hours 10/30/17 0745 10/30/17 0915   10/30/17 1000  cefTRIAXone (ROCEPHIN) 2 g in sodium chloride 0.9 % 100 mL IVPB     2 g 200 mL/hr over 30 Minutes Intravenous Every 24 hours 10/30/17 0916     10/28/17 2300  piperacillin-tazobactam (ZOSYN) IVPB 3.375 g  Status:  Discontinued     3.375 g 12.5 mL/hr over 240 Minutes Intravenous Every 8 hours 10/27/17 2108 10/28/17 1014   10/28/17 1015  cefTRIAXone (ROCEPHIN) 2 g in sodium chloride 0.9 % 100 mL IVPB  Status:  Discontinued     2 g 200 mL/hr over 30 Minutes Intravenous Every 24 hours 10/28/17 1014 10/30/17  0745   10/28/17 0600  vancomycin (VANCOCIN) IVPB 750 mg/150 ml premix  Status:  Discontinued     750 mg 150 mL/hr over 60 Minutes Intravenous Every 12 hours 10/27/17 2108 10/27/17 2110   10/27/17 1730  vancomycin (VANCOCIN) 1,500 mg in  sodium chloride 0.9 % 500 mL IVPB     1,500 mg 250 mL/hr over 120 Minutes Intravenous  Once 10/27/17 1657 10/27/17 2300   10/27/17 1700  piperacillin-tazobactam (ZOSYN) IVPB 3.375 g     3.375 g 100 mL/hr over 30 Minutes Intravenous  Once 10/27/17 1647 10/27/17 1735   10/27/17 1700  vancomycin (VANCOCIN) IVPB 1000 mg/200 mL premix  Status:  Discontinued     1,000 mg 200 mL/hr over 60 Minutes Intravenous  Once 10/27/17 1647 10/27/17 1657       Objective: Physical Exam: Vitals:   10/30/17 0525 10/30/17 0759 10/30/17 0817 10/30/17 1348  BP: (!) 147/81 (!) 153/106  (!) 130/99  Pulse: 79 (!) 105  94  Resp: 18   20  Temp: 99.2 F (37.3 C) (!) 102.8 F (39.3 C) (!) 101.3 F (38.5 C) 99.9 F (37.7 C)  TempSrc: Oral Oral Axillary Oral  SpO2: 94%   100%  Weight: 65.2 kg (143 lb 11.8 oz)     Height:        Intake/Output Summary (Last 24 hours) at 10/30/2017 1737 Last data filed at 10/30/2017 1656 Gross per 24 hour  Intake 580 ml  Output 1050 ml  Net -470 ml   Filed Weights   10/28/17 2217 10/29/17 0501 10/30/17 0525  Weight: 67.4 kg (148 lb 9.4 oz) 65.2 kg (143 lb 11.8 oz) 65.2 kg (143 lb 11.8 oz)   General: Alert, Awake and Oriented to Time, Place and Person. Appear in mild distress, affect appropriate Eyes: PERRL, Conjunctiva normal ENT: Oral Mucosa clear moist. Neck: no JVD, no Abnormal Mass Or lumps Cardiovascular: S1 and S2 Present, no Murmur, Peripheral Pulses Present Respiratory: normal respiratory effort, Bilateral Air entry equal and Decreased, no use of accessory muscle, Clear to Auscultation, no Crackles, no wheezes Abdomen: Bowel Sound present, Soft and no tenderness, no hernia Skin: no redness, on Rash, no induration Extremities: bilateral AKA Neurologic: Grossly no focal neuro deficit. Bilaterally Equal motor strength  Data Reviewed: CBC: Recent Labs  Lab 10/27/17 1614 10/28/17 0333 10/29/17 0742 10/30/17 0524  WBC 14.6* 10.2 6.8 4.6  NEUTROABS  --   --    --  3.1  HGB 12.2* 13.1 11.3* 13.0  HCT 36.9* 39.8 34.1* 37.6*  MCV 77.5* 78.8 78.4 77.0*  PLT 140* 120* 107* 562*   Basic Metabolic Panel: Recent Labs  Lab 10/27/17 1614 10/27/17 1909 10/28/17 0333 10/29/17 0220 10/29/17 1916 10/30/17 0524  NA 144  --  141 135 137 138  K 2.8*  --  4.8 5.2* 4.8 3.4*  CL 105  --  116* 109 111 108  CO2 26  --  14* 17* 16* 20*  GLUCOSE 69  --  87 159* 213* 103*  BUN 21*  --  17 11 8  <5*  CREATININE 1.22  --  0.77 0.57* 0.58* 0.64  CALCIUM 7.9*  --  8.1* 7.8* 8.0* 8.1*  MG  --  0.8* 2.4  --   --  1.9  PHOS  --   --  2.4*  --   --   --     Liver Function Tests: Recent Labs  Lab 10/27/17 1614 10/28/17 0333 10/30/17 0524  AST 274* 211* 102*  ALT 312* 275* 172*  ALKPHOS 148* 134* 128*  BILITOT 4.0* 4.5* 2.1*  PROT 5.7* 5.9* 6.1*  ALBUMIN 2.7* 2.8* 2.7*   No results for input(s): LIPASE, AMYLASE in the last 168 hours. No results for input(s): AMMONIA in the last 168 hours. Coagulation Profile: No results for input(s): INR, PROTIME in the last 168 hours. Cardiac Enzymes: Recent Labs  Lab 10/28/17 1256 10/28/17 1920 10/29/17 0220 10/29/17 0742 10/29/17 1500  TROPONINI 0.44* 1.03* 1.12* 1.29* 0.70*   BNP (last 3 results) No results for input(s): PROBNP in the last 8760 hours. CBG: Recent Labs  Lab 10/29/17 1615 10/29/17 2120 10/30/17 0724 10/30/17 1202 10/30/17 1633  GLUCAP 157* 176* 105* 104* 94   Studies: Dg Chest Port 1 View  Result Date: 10/30/2017 CLINICAL DATA:  Shortness of Breath EXAM: PORTABLE CHEST 1 VIEW COMPARISON:  10/27/2017 FINDINGS: Cardiomegaly. Left lower lobe atelectasis or infiltrate. No confluent opacity on the right. No overt edema. No effusions or acute bony abnormality. IMPRESSION: Cardiomegaly. Left lower lobe atelectasis or pneumonia. Electronically Signed   By: Rolm Baptise M.D.   On: 10/30/2017 10:23    Scheduled Meds: . aspirin EC  81 mg Oral Daily  . insulin aspart  0-5 Units Subcutaneous QHS    . insulin aspart  0-9 Units Subcutaneous TID WC  . nicotine  14 mg Transdermal Daily  . pantoprazole  40 mg Oral Q lunch  . [START ON 10/31/2017] pregabalin  25 mg Oral Daily  . rosuvastatin  20 mg Oral q1800   Continuous Infusions: . cefTRIAXone (ROCEPHIN)  IV Stopped (10/30/17 1240)   PRN Meds: acetaminophen, albuterol, azelastine, hydrALAZINE, sodium chloride flush  Time spent: 35 minutes  Author: Berle Mull, MD [facility] Pager: [phone] 10/30/2017 5:37 PM  If 7PM-7AM, please contact night-coverage at [email or web address], password [facility]

## 2017-10-30 NOTE — Progress Notes (Addendum)
The patient has been seen in conjunction with Reino Bellis, NP. All aspects of care have been considered and discussed. The patient has been personally interviewed, examined, and all clinical data has been reviewed.   Surprisingly, heart rate is increased.  EKG needs to document arrhythmia.  Otherwise stable and recovering from Klebsiella sepsis.  Progress Note  Patient Name: Jaime [name] Date of Encounter: 10/30/2017  Primary Cardiologist: [name], MD  Subjective   Not feeling well this morning. No pain but feels bad. Pulled out IVs and spiked a temp. More confused per nursing staff.    Inpatient Medications    Scheduled Meds: . aspirin EC  81 mg Oral Daily  . insulin aspart  0-5 Units Subcutaneous QHS  . insulin aspart  0-9 Units Subcutaneous TID WC  . nicotine  14 mg Transdermal Daily  . pantoprazole  40 mg Oral Q lunch  . [START ON 10/31/2017] pregabalin  25 mg Oral Daily  . rosuvastatin  20 mg Oral q1800   Continuous Infusions: . cefTRIAXone (ROCEPHIN)  IV     PRN Meds: acetaminophen, albuterol, azelastine, hydrALAZINE   Vital Signs    Vitals:   10/30/17 0029 10/30/17 0525 10/30/17 0759 10/30/17 0817  BP: (!) 142/84 (!) 147/81 (!) 153/106   Pulse: 80 79 (!) 105   Resp: 18 18    Temp: 99.3 F (37.4 C) 99.2 F (37.3 C) (!) 102.8 F (39.3 C) (!) 101.3 F (38.5 C)  TempSrc: Oral Oral Oral Axillary  SpO2: 95% 94%    Weight:  143 lb 11.8 oz (65.2 kg)    Height:        Intake/Output Summary (Last 24 hours) at 10/30/2017 1107 Last data filed at 10/30/2017 0920 Gross per 24 hour  Intake 700 ml  Output 1150 ml  Net -450 ml   Filed Weights   10/28/17 2217 10/29/17 0501 10/30/17 0525  Weight: 148 lb 9.4 oz (67.4 kg) 143 lb 11.8 oz (65.2 kg) 143 lb 11.8 oz (65.2 kg)    Telemetry    SR with episodes of ST - Personally Reviewed  ECG    N/a - Personally Reviewed  Physical Exam   General: Older AA male appearing in no acute distress, but  lethargic. Head: Normocephalic, atraumatic.  Neck: Supple, no JVD. Lungs:  Resp regular and unlabored, CTA. Heart: RRR, S1, S2, no rub. Abdomen: Soft, non-tender, non-distended with normoactive bowel sounds.  Extremities: Bilateral BKA Neuro: Alert and oriented X 3, but sluggish.  Psych: Normal affect.  Labs    Chemistry Recent Labs  Lab 10/27/17 1614 10/28/17 0333 10/29/17 0220 10/29/17 1916 10/30/17 0524  NA 144 141 135 137 138  K 2.8* 4.8 5.2* 4.8 3.4*  CL 105 116* 109 111 108  CO2 26 14* 17* 16* 20*  GLUCOSE 69 87 159* 213* 103*  BUN 21* 17 11 8  <5*  CREATININE 1.22 0.77 0.57* 0.58* 0.64  CALCIUM 7.9* 8.1* 7.8* 8.0* 8.1*  PROT 5.7* 5.9*  --   --  6.1*  ALBUMIN 2.7* 2.8*  --   --  2.7*  AST 274* 211*  --   --  102*  ALT 312* 275*  --   --  172*  ALKPHOS 148* 134*  --   --  128*  BILITOT 4.0* 4.5*  --   --  2.1*  GFRNONAA 56* >60 >60 >60 >60  GFRAA >60 >60 >60 >60 >60  ANIONGAP 13 11 9 10  10  Hematology Recent Labs  Lab 10/28/17 0333 10/29/17 0742 10/30/17 0524  WBC 10.2 6.8 4.6  RBC 5.05 4.35 4.88  HGB 13.1 11.3* 13.0  HCT 39.8 34.1* 37.6*  MCV 78.8 78.4 77.0*  MCH 25.9* 26.0 26.6  MCHC 32.9 33.1 34.6  RDW 17.8* 18.2* 18.0*  PLT 120* 107* 115*    Cardiac Enzymes Recent Labs  Lab 10/28/17 1920 10/29/17 0220 10/29/17 0742 10/29/17 1500  TROPONINI 1.03* 1.12* 1.29* 0.70*   No results for input(s): TROPIPOC in the last 168 hours.   BNPNo results for input(s): BNP, PROBNP in the last 168 hours.   DDimer No results for input(s): DDIMER in the last 168 hours.    Radiology    Dg Chest Port 1 View  Result Date: 10/30/2017 CLINICAL DATA:  Shortness of Breath EXAM: PORTABLE CHEST 1 VIEW COMPARISON:  10/27/2017 FINDINGS: Cardiomegaly. Left lower lobe atelectasis or infiltrate. No confluent opacity on the right. No overt edema. No effusions or acute bony abnormality. IMPRESSION: Cardiomegaly. Left lower lobe atelectasis or pneumonia. Electronically  Signed   By: Rolm Baptise M.D.   On: 10/30/2017 10:23    Cardiac Studies   TTE: 10/29/17  Study Conclusions  - Left ventricle: The cavity size was normal. There was moderate   concentric hypertrophy. Systolic function was moderately to   severely reduced. The estimated ejection fraction was in the   range of 30% to 35%. There is akinesis of the basal and mid   inferior, inferolateral and apical inferior walls. Doppler   parameters are consistent with abnormal left ventricular   relaxation (grade 1 diastolic dysfunction). - Aortic valve: There was trivial regurgitation. - Mitral valve: There was mild regurgitation. - Right ventricle: The cavity size was normal. Wall thickness was   normal. Systolic function was normal. - Right atrium: The atrium was normal in size. - Tricuspid valve: There was moderate regurgitation. - Pulmonic valve: There was trivial regurgitation. - Pulmonary arteries: Systolic pressure was mildly increased. PA   peak pressure: 39 mm Hg (S). - Inferior vena cava: The vessel was normal in size. - Pericardium, extracardiac: There was no pericardial effusion.  Patient Profile     77 y.o. male  with a hx of chronic systolic and diastolic heart failure, ischemic cardiomyopathy, CAD s/p PCI to LCx (2007), MS, DM, s/p bilateral BKA, s/p cholecystectomy (2016), current smoker, PVD, and fem-tib bypass (2013)who was seen in relation to an elevated troponin.   Assessment & Plan    1. Elevated Troponin: peaking at 1.29. He continues to deny any chest pain. It is felt that in the setting of sepsis and hypotension this troponin is related to demand ischemia. Echo with EF of 30-35% with inferobasal hypokinesis which was noted on report from 2017. Given his overall clinical picture will continue with medical therapy as tolerated.   2. ICM: does not appear volume overloaded on exam.   3. Shock/sepsis: Spiked a temp this morning and confused pulling out his IVs yesterday  afternoon. HR is elevated, but will defer any AV blocking agents given his hx of AVB and suspect this is in relation to his illness.   4. 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 Remains stable on telemetry, on no AV nodal blocking agents.   Signed, Reino Bellis, NP  10/30/2017, 11:07 AM  Pager # [phone]   For questions or updates, please contact [place] Please consult [email or web address] for contact info under Cardiology/STEMI.

## 2017-10-31 DIAGNOSIS — I440 Atrioventricular block, first degree: Secondary | ICD-10-CM

## 2017-10-31 LAB — GLUCOSE, CAPILLARY
GLUCOSE-CAPILLARY: 186 mg/dL — AB (ref 65–99)
GLUCOSE-CAPILLARY: 191 mg/dL — AB (ref 65–99)
Glucose-Capillary: 128 mg/dL — ABNORMAL HIGH (ref 65–99)
Glucose-Capillary: 150 mg/dL — ABNORMAL HIGH (ref 65–99)

## 2017-10-31 LAB — BASIC METABOLIC PANEL
ANION GAP: 9 (ref 5–15)
BUN: 9 mg/dL (ref 6–20)
CO2: 21 mmol/L — AB (ref 22–32)
Calcium: 8.1 mg/dL — ABNORMAL LOW (ref 8.9–10.3)
Chloride: 107 mmol/L (ref 101–111)
Creatinine, Ser: 0.68 mg/dL (ref 0.61–1.24)
GFR calc Af Amer: >60 mL/min (ref >60)
GLUCOSE: 178 mg/dL — AB (ref 65–99)
Potassium: 3.4 mmol/L — ABNORMAL LOW (ref 3.5–5.1)
Sodium: 137 mmol/L (ref 135–145)

## 2017-10-31 LAB — CBC
HEMATOCRIT: 34.2 % — AB (ref 39.0–52.0)
Hemoglobin: 11.5 g/dL — ABNORMAL LOW (ref 13.0–17.0)
MCH: 25.7 pg — AB (ref 26.0–34.0)
MCHC: 33.6 g/dL (ref 30.0–36.0)
MCV: 76.3 fL — AB (ref 78.0–100.0)
PLATELETS: 139 10*3/uL — AB (ref 150–400)
RBC: 4.48 MIL/uL (ref 4.22–5.81)
RDW: 17.7 % — AB (ref 11.5–15.5)
WBC: 5.2 10*3/uL (ref 4.0–10.5)

## 2017-10-31 MED ORDER — TRAMADOL HCL 50 MG PO TABS
50.0000 mg | ORAL_TABLET | Freq: Four times a day (QID) | ORAL | Status: DC | PRN
  Administered 2017-10-31: 50 mg via ORAL
  Filled 2017-10-31: qty 1

## 2017-10-31 NOTE — Progress Notes (Signed)
CARDIOLOGY NOTE   Tachycardia evaluated with EKG.  ECG demonstrates sinus with first-degree AV block.  No specific medication recommended and would certainly avoid beta-blocker therapy as he has significant AV conduction delay and could develop complete heart block.  Please call us if any cardiac concerns.  Recommend conservative medical management.

## 2017-10-31 NOTE — Plan of Care (Signed)
  Problem: Clinical Measurements: Goal: Ability to maintain clinical measurements within normal limits will improve Outcome: Progressing   Problem: Coping: Goal: Level of anxiety will decrease Outcome: Progressing   Problem: Nutrition: Goal: Adequate nutrition will be maintained Outcome: Progressing   Problem: Pain Managment: Goal: General experience of comfort will improve Outcome: Progressing   Problem: Safety: Goal: Ability to remain free from injury will improve Outcome: Progressing   

## 2017-10-31 NOTE — Progress Notes (Signed)
[facility] Progress Note  Patient: Jaime [name] OFB:[id number]   PCP: [name], MD DOB: 21-Oct-1940   DOA: 10/27/2017   DOS: 10/31/2017   Date of Service: the patient was seen and examined on 10/31/2017  Subjective: Mentation wise patient is   Back to baseline.  Denies any other acute complaint.  No nausea no vomiting no chest pain no shortness of breath.  No diarrhea. no fever in last 24 hours.  Brief hospital course: Pt. with PMH of HFrEF 78% ( diastolic and systolic dysfn), MS, DM, s/p bilat BKA s/p cholecystectomy in 2016, +tobacco h/o, PVD, CAD and FemTib bypass in 2013; admitted on 10/27/2017, presented with complaint of confusion, unresponsive episode, sharp, was found to have Klebsiella bacteremia sepsis. Currently further plan is continue IV antibiotics.  Assessment and Plan: 1.  Septic shock due to urinary tract infection. Klebsiella bacteremia. Recurrent fever. Blood cultures 1 out of 2 positive for Klebsiella. 1 out of 2 also positive for coagulase-negative staph-likely contamination. Urine culture shows multiple species. Admitted to ICU started on IV fluids required levo fed. Now off the pressors and on the floor. I will continue IV ceftriaxone for now. Repeat blood cultures performed, pro calcitonin level is not significantly elevated.  Chest x-ray has made a comment regarding possible left lower lobe pneumonia although x-ray is rotated therefore I do not think there is a pneumonia there. Will monitor.  2.  Hypertension Dyslipidemia Elevated troponin, CAD Chronic systolic CHF, echocardiogram shows 35% EF. Continue monitoring on telemetry. Cardiology consulted. Troponin now trending downwards. Echocardiogram performed, results pending. Neurology feels this appears to be demand ischemia from sepsis. Medical conservative treatment recommended for now. Continue aspirin. Resume statin.  3.  Bilateral AKA. Phantom pain. I resumed patient's Lyrica, although we  will reduce the dose due to patient's encephalopathy. Discontinue baclofen for now.  4.  Elevated LFT. Ultrasound negative for any acute abnormality. Now trending down. Monitor.  5.  Non-anion gap metabolic acidosis. Most likely after normal patient's Benicar was resumed saline resuscitation. Monitor.  6.  Thrombocytopenia. Acute in the setting of sepsis. Monitor. No evidence of active bleeding for now.  7.  Acute metabolic encephalopathy with a component of toxic encephalopathy due to drug. Likely in the setting of fever, use of Lyrica and baclofen. Reducing the dose of Lyrica Discontinuing baclofen.  Fever subsided.  Monitor.  8.  Acute kidney injury. His creatinine 0.5 2.7.  Presented with serum creatinine of 1.22.  In the setting of septic shock.  Given IV hydration.  Now renal function back to baseline.  Diet: Cardiac diet DVT Prophylaxis: subcutaneous Heparin  Advance goals of care discussion: Full code  Family Communication: no family was present at bedside, at the time of interview.   Disposition:  Discharge to home.  PT recommended no therapy although home health has been arranged given patient's presentation with severe weakness.  Consultants: PCCM, cardiology Procedures: Echocardiogram   Antibiotics: Anti-infectives (From admission, onward)   Start     Dose/Rate Route Frequency Ordered Stop   10/30/17 1000  cefdinir (OMNICEF) capsule 300 mg  Status:  Discontinued     300 mg Oral Every 12 hours 10/30/17 0745 10/30/17 0915   10/30/17 1000  cefTRIAXone (ROCEPHIN) 2 g in sodium chloride 0.9 % 100 mL IVPB     2 g 200 mL/hr over 30 Minutes Intravenous Every 24 hours 10/30/17 0916     10/28/17 2300  piperacillin-tazobactam (ZOSYN) IVPB 3.375 g  Status:  Discontinued  3.375 g 12.5 mL/hr over 240 Minutes Intravenous Every 8 hours 10/27/17 2108 10/28/17 1014   10/28/17 1015  cefTRIAXone (ROCEPHIN) 2 g in sodium chloride 0.9 % 100 mL IVPB  Status:  Discontinued       2 g 200 mL/hr over 30 Minutes Intravenous Every 24 hours 10/28/17 1014 10/30/17 0745   10/28/17 0600  vancomycin (VANCOCIN) IVPB 750 mg/150 ml premix  Status:  Discontinued     750 mg 150 mL/hr over 60 Minutes Intravenous Every 12 hours 10/27/17 2108 10/27/17 2110   10/27/17 1730  vancomycin (VANCOCIN) 1,500 mg in sodium chloride 0.9 % 500 mL IVPB     1,500 mg 250 mL/hr over 120 Minutes Intravenous  Once 10/27/17 1657 10/27/17 2300   10/27/17 1700  piperacillin-tazobactam (ZOSYN) IVPB 3.375 g     3.375 g 100 mL/hr over 30 Minutes Intravenous  Once 10/27/17 1647 10/27/17 1735   10/27/17 1700  vancomycin (VANCOCIN) IVPB 1000 mg/200 mL premix  Status:  Discontinued     1,000 mg 200 mL/hr over 60 Minutes Intravenous  Once 10/27/17 1647 10/27/17 1657       Objective: Physical Exam: Vitals:   10/31/17 0800 10/31/17 1127 10/31/17 1200 10/31/17 1553  BP: 112/74 (!) 127/102 (!) 150/95 129/90  Pulse:  (!) 102 93 91  Resp: 12 16 15 14   Temp: 98.7 F (37.1 C) 98.6 F (37 C) 98.1 F (36.7 C) 97.7 F (36.5 C)  TempSrc: Oral Oral Oral Oral  SpO2: 93% 99% 100% 100%  Weight:      Height:        Intake/Output Summary (Last 24 hours) at 10/31/2017 1744 Last data filed at 10/31/2017 1430 Gross per 24 hour  Intake 1000 ml  Output 1000 ml  Net 0 ml   Filed Weights   10/29/17 0501 10/30/17 0525 10/31/17 0405  Weight: 65.2 kg (143 lb 11.8 oz) 65.2 kg (143 lb 11.8 oz) 65.9 kg (145 lb 4.5 oz)   General: Alert, Awake and Oriented to Time, Place and Person. Appear in mild distress, affect appropriate Eyes: PERRL, Conjunctiva normal ENT: Oral Mucosa clear moist. Neck: no JVD, no Abnormal Mass Or lumps Cardiovascular: S1 and S2 Present, no Murmur, Peripheral Pulses Present Respiratory: normal respiratory effort, Bilateral Air entry equal and Decreased, no use of accessory muscle, Clear to Auscultation, no Crackles, no wheezes Abdomen: Bowel Sound present, Soft and no tenderness, no  hernia Skin: no redness, on Rash, no induration Extremities: bilateral AKA Neurologic: Grossly no focal neuro deficit. Bilaterally Equal motor strength  Data Reviewed: CBC: Recent Labs  Lab 10/27/17 1614 10/28/17 0333 10/29/17 0742 10/30/17 0524 10/31/17 0416  WBC 14.6* 10.2 6.8 4.6 5.2  NEUTROABS  --   --   --  3.1  --   HGB 12.2* 13.1 11.3* 13.0 11.5*  HCT 36.9* 39.8 34.1* 37.6* 34.2*  MCV 77.5* 78.8 78.4 77.0* 76.3*  PLT 140* 120* 107* 115* 106*   Basic Metabolic Panel: Recent Labs  Lab 10/27/17 1909 10/28/17 0333 10/29/17 0220 10/29/17 1916 10/30/17 0524 10/31/17 0416  NA  --  141 135 137 138 137  K  --  4.8 5.2* 4.8 3.4* 3.4*  CL  --  116* 109 111 108 107  CO2  --  14* 17* 16* 20* 21*  GLUCOSE  --  87 159* 213* 103* 178*  BUN  --  17 11 8  <5* 9  CREATININE  --  0.77 0.57* 0.58* 0.64 0.68  CALCIUM  --  8.1*  7.8* 8.0* 8.1* 8.1*  MG 0.8* 2.4  --   --  1.9  --   PHOS  --  2.4*  --   --   --   --     Liver Function Tests: Recent Labs  Lab 10/27/17 1614 10/28/17 0333 10/30/17 0524  AST 274* 211* 102*  ALT 312* 275* 172*  ALKPHOS 148* 134* 128*  BILITOT 4.0* 4.5* 2.1*  PROT 5.7* 5.9* 6.1*  ALBUMIN 2.7* 2.8* 2.7*   No results for input(s): LIPASE, AMYLASE in the last 168 hours. No results for input(s): AMMONIA in the last 168 hours. Coagulation Profile: No results for input(s): INR, PROTIME in the last 168 hours. Cardiac Enzymes: Recent Labs  Lab 10/28/17 1256 10/28/17 1920 10/29/17 0220 10/29/17 0742 10/29/17 1500  TROPONINI 0.44* 1.03* 1.12* 1.29* 0.70*   BNP (last 3 results) No results for input(s): PROBNP in the last 8760 hours. CBG: Recent Labs  Lab 10/30/17 1633 10/30/17 2057 10/31/17 0741 10/31/17 1127 10/31/17 1640  GLUCAP 94 164* 128* 186* 150*   Studies: No results found.  Scheduled Meds: . aspirin EC  81 mg Oral Daily  . insulin aspart  0-5 Units Subcutaneous QHS  . insulin aspart  0-9 Units Subcutaneous TID WC  . nicotine   14 mg Transdermal Daily  . pantoprazole  40 mg Oral Q lunch  . pregabalin  25 mg Oral Daily  . rosuvastatin  20 mg Oral q1800   Continuous Infusions: . cefTRIAXone (ROCEPHIN)  IV Stopped (10/31/17 1032)   PRN Meds: acetaminophen, albuterol, azelastine, hydrALAZINE, sodium chloride flush, traMADol  Time spent: 35 minutes  Author: Berle Mull, MD [facility] Pager: [phone] 10/31/2017 5:44 PM  If 7PM-7AM, please contact night-coverage at [email or web address], password [facility]

## 2017-10-31 NOTE — Care Management Note (Signed)
Case Management Note  Patient Details  Name: Jaime [name] MRN: [id number] Date of Birth: 05-11-1941  Subjective/Objective:  CHF                Action/Plan: Patient lives at home with spouse; PCP: [name], MD; has private insurance with Medicare; CM talked to his spouse [name]; he has a personal caregiver that they privately pay to assist in his care a few days a week; [place] choice offered, she chose [place]; Dan with [facility] called for arrangements; she stated that he does not need any equipment at home; CM will continue to follow for progression of care.  Expected Discharge Date:    possibly 11/04/2017              Expected Discharge Plan:  [place]  In-House Referral:   [facility]  Discharge planning Services  CM Consult  Choice offered to:  Patient, Spouse  HH Arranged:  RN, Disease Management, PT [place] Agency:  [place]  Status of Service:  In process, will continue to follow  Sherrilyn Rist [phone] 10/31/2017, 10:43 AM

## 2017-10-31 NOTE — Progress Notes (Signed)
Patient complaining that "something doesn't feel right" and wanted to call his wife. Patient states he's not in pain and neuro checks negative, RN called wife to ease anxiety and wife states "he gets anxious sometimes and it's bad", wife says at home lyrical and tramadol helps him. RN provided patient with therapeutic, calming techniques- which helped and patient says he feels better now.

## 2017-10-31 NOTE — Progress Notes (Signed)
[facility] Progress Note  Patient: Jaime [name] [id number]:[id number]   PCP: [name], MD DOB: 04-24-1941   DOA: 10/27/2017   DOS: 10/31/2017   Date of Service: the patient was seen and examined on 10/31/2017  Subjective: Mentation wise patient is   Back to baseline.  Denies any other acute complaint.  No nausea no vomiting no chest pain no shortness of breath.  No diarrhea. no fever in last 24 hours.  Brief hospital course: Pt. with PMH of HFrEF 34% ( diastolic and systolic dysfn), MS, DM, s/p bilat BKA s/p cholecystectomy in 2016, +tobacco h/o, PVD, CAD and FemTib bypass in 2013; admitted on 10/27/2017, presented with complaint of confusion, unresponsive episode, sharp, was found to have Klebsiella bacteremia sepsis. Currently further plan is continue IV antibiotics.  Assessment and Plan: 1.  Septic shock due to urinary tract infection. Klebsiella bacteremia. Recurrent fever. Blood cultures 1 out of 2 positive for Klebsiella. 1 out of 2 also positive for coagulase-negative staph-likely contamination. Urine culture shows multiple species. Admitted to ICU started on IV fluids required levo fed. Now off the pressors and on the floor. I will continue IV ceftriaxone for now. Repeat blood cultures performed, pro calcitonin level is not significantly elevated.  Chest x-ray has made a comment regarding possible left lower lobe pneumonia although x-ray is rotated therefore I do not think there is a pneumonia there. Will monitor.  2.  Hypertension Dyslipidemia Elevated troponin, CAD Chronic systolic CHF, echocardiogram shows 35% EF. Continue monitoring on telemetry. Cardiology consulted. Troponin now trending downwards. Echocardiogram performed, results pending. Neurology feels this appears to be demand ischemia from sepsis. Medical conservative treatment recommended for now. Continue aspirin. Resume statin.  3.  Bilateral AKA. Phantom pain. I resumed patient's Lyrica, although we  will reduce the dose due to patient's encephalopathy. Discontinue baclofen for now.  4.  Elevated LFT. Ultrasound negative for any acute abnormality. Now trending down. Monitor.  5.  Non-anion gap metabolic acidosis. Most likely after normal patient's Benicar was resumed saline resuscitation. Monitor.  6.  Thrombocytopenia. Acute in the setting of sepsis. Monitor. No evidence of active bleeding for now.  7.  Acute metabolic encephalopathy with a component of toxic encephalopathy due to drug. Likely in the setting of fever, use of Lyrica and baclofen. Reducing the dose of Lyrica Discontinuing baclofen.  Fever subsided.  Monitor.  Diet: Cardiac diet DVT Prophylaxis: subcutaneous Heparin  Advance goals of care discussion: Full code  Family Communication: no family was present at bedside, at the time of interview.   Disposition:  Discharge to home.  PT recommended no therapy although home health has been arranged given patient's presentation with severe weakness.  Consultants: PCCM, cardiology Procedures: Echocardiogram   Antibiotics: Anti-infectives (From admission, onward)   Start     Dose/Rate Route Frequency Ordered Stop   10/30/17 1000  cefdinir (OMNICEF) capsule 300 mg  Status:  Discontinued     300 mg Oral Every 12 hours 10/30/17 0745 10/30/17 0915   10/30/17 1000  cefTRIAXone (ROCEPHIN) 2 g in sodium chloride 0.9 % 100 mL IVPB     2 g 200 mL/hr over 30 Minutes Intravenous Every 24 hours 10/30/17 0916     10/28/17 2300  piperacillin-tazobactam (ZOSYN) IVPB 3.375 g  Status:  Discontinued     3.375 g 12.5 mL/hr over 240 Minutes Intravenous Every 8 hours 10/27/17 2108 10/28/17 1014   10/28/17 1015  cefTRIAXone (ROCEPHIN) 2 g in sodium chloride 0.9 % 100 mL IVPB  Status:  Discontinued     2 g 200 mL/hr over 30 Minutes Intravenous Every 24 hours 10/28/17 1014 10/30/17 0745   10/28/17 0600  vancomycin (VANCOCIN) IVPB 750 mg/150 ml premix  Status:  Discontinued     750  mg 150 mL/hr over 60 Minutes Intravenous Every 12 hours 10/27/17 2108 10/27/17 2110   10/27/17 1730  vancomycin (VANCOCIN) 1,500 mg in sodium chloride 0.9 % 500 mL IVPB     1,500 mg 250 mL/hr over 120 Minutes Intravenous  Once 10/27/17 1657 10/27/17 2300   10/27/17 1700  piperacillin-tazobactam (ZOSYN) IVPB 3.375 g     3.375 g 100 mL/hr over 30 Minutes Intravenous  Once 10/27/17 1647 10/27/17 1735   10/27/17 1700  vancomycin (VANCOCIN) IVPB 1000 mg/200 mL premix  Status:  Discontinued     1,000 mg 200 mL/hr over 60 Minutes Intravenous  Once 10/27/17 1647 10/27/17 1657       Objective: Physical Exam: Vitals:   10/31/17 0800 10/31/17 1127 10/31/17 1200 10/31/17 1553  BP: 112/74 (!) 127/102 (!) 150/95 129/90  Pulse:  (!) 102 93 91  Resp: 12 16 15 14   Temp: 98.7 F (37.1 C) 98.6 F (37 C) 98.1 F (36.7 C) 97.7 F (36.5 C)  TempSrc: Oral Oral Oral Oral  SpO2: 93% 99% 100% 100%  Weight:      Height:        Intake/Output Summary (Last 24 hours) at 10/31/2017 1741 Last data filed at 10/31/2017 1430 Gross per 24 hour  Intake 1000 ml  Output 1000 ml  Net 0 ml   Filed Weights   10/29/17 0501 10/30/17 0525 10/31/17 0405  Weight: 65.2 kg (143 lb 11.8 oz) 65.2 kg (143 lb 11.8 oz) 65.9 kg (145 lb 4.5 oz)   General: Alert, Awake and Oriented to Time, Place and Person. Appear in mild distress, affect appropriate Eyes: PERRL, Conjunctiva normal ENT: Oral Mucosa clear moist. Neck: no JVD, no Abnormal Mass Or lumps Cardiovascular: S1 and S2 Present, no Murmur, Peripheral Pulses Present Respiratory: normal respiratory effort, Bilateral Air entry equal and Decreased, no use of accessory muscle, Clear to Auscultation, no Crackles, no wheezes Abdomen: Bowel Sound present, Soft and no tenderness, no hernia Skin: no redness, on Rash, no induration Extremities: bilateral AKA Neurologic: Grossly no focal neuro deficit. Bilaterally Equal motor strength  Data Reviewed: CBC: Recent Labs  Lab  10/27/17 1614 10/28/17 0333 10/29/17 0742 10/30/17 0524 10/31/17 0416  WBC 14.6* 10.2 6.8 4.6 5.2  NEUTROABS  --   --   --  3.1  --   HGB 12.2* 13.1 11.3* 13.0 11.5*  HCT 36.9* 39.8 34.1* 37.6* 34.2*  MCV 77.5* 78.8 78.4 77.0* 76.3*  PLT 140* 120* 107* 115* 326*   Basic Metabolic Panel: Recent Labs  Lab 10/27/17 1909 10/28/17 0333 10/29/17 0220 10/29/17 1916 10/30/17 0524 10/31/17 0416  NA  --  141 135 137 138 137  K  --  4.8 5.2* 4.8 3.4* 3.4*  CL  --  116* 109 111 108 107  CO2  --  14* 17* 16* 20* 21*  GLUCOSE  --  87 159* 213* 103* 178*  BUN  --  17 11 8  <5* 9  CREATININE  --  0.77 0.57* 0.58* 0.64 0.68  CALCIUM  --  8.1* 7.8* 8.0* 8.1* 8.1*  MG 0.8* 2.4  --   --  1.9  --   PHOS  --  2.4*  --   --   --   --  Liver Function Tests: Recent Labs  Lab 10/27/17 1614 10/28/17 0333 10/30/17 0524  AST 274* 211* 102*  ALT 312* 275* 172*  ALKPHOS 148* 134* 128*  BILITOT 4.0* 4.5* 2.1*  PROT 5.7* 5.9* 6.1*  ALBUMIN 2.7* 2.8* 2.7*   No results for input(s): LIPASE, AMYLASE in the last 168 hours. No results for input(s): AMMONIA in the last 168 hours. Coagulation Profile: No results for input(s): INR, PROTIME in the last 168 hours. Cardiac Enzymes: Recent Labs  Lab 10/28/17 1256 10/28/17 1920 10/29/17 0220 10/29/17 0742 10/29/17 1500  TROPONINI 0.44* 1.03* 1.12* 1.29* 0.70*   BNP (last 3 results) No results for input(s): PROBNP in the last 8760 hours. CBG: Recent Labs  Lab 10/30/17 1633 10/30/17 2057 10/31/17 0741 10/31/17 1127 10/31/17 1640  GLUCAP 94 164* 128* 186* 150*   Studies: No results found.  Scheduled Meds: . aspirin EC  81 mg Oral Daily  . insulin aspart  0-5 Units Subcutaneous QHS  . insulin aspart  0-9 Units Subcutaneous TID WC  . nicotine  14 mg Transdermal Daily  . pantoprazole  40 mg Oral Q lunch  . pregabalin  25 mg Oral Daily  . rosuvastatin  20 mg Oral q1800   Continuous Infusions: . cefTRIAXone (ROCEPHIN)  IV Stopped  (10/31/17 1032)   PRN Meds: acetaminophen, albuterol, azelastine, hydrALAZINE, sodium chloride flush, traMADol  Time spent: 35 minutes  Author: Berle Mull, MD [facility] Pager: [phone] 10/31/2017 5:41 PM  If 7PM-7AM, please contact night-coverage at [email or web address], password [facility]

## 2017-10-31 NOTE — Progress Notes (Signed)
BP running high throughout the night, RN gave BP PRN medication. Also, wife requests that patient be able to get tramadol as needed because that's what he gets at home and it helps with his pain and anxiety.

## 2017-11-01 LAB — CULTURE, BLOOD (ROUTINE X 2)
CULTURE: NO GROWTH
Special Requests: ADEQUATE

## 2017-11-01 LAB — CBC
HCT: 33.5 % — ABNORMAL LOW (ref 39.0–52.0)
HEMOGLOBIN: 11.4 g/dL — AB (ref 13.0–17.0)
MCH: 25.6 pg — AB (ref 26.0–34.0)
MCHC: 34 g/dL (ref 30.0–36.0)
MCV: 75.3 fL — AB (ref 78.0–100.0)
Platelets: 155 10*3/uL (ref 150–400)
RBC: 4.45 MIL/uL (ref 4.22–5.81)
RDW: 17.1 % — ABNORMAL HIGH (ref 11.5–15.5)
WBC: 5.8 10*3/uL (ref 4.0–10.5)

## 2017-11-01 LAB — BASIC METABOLIC PANEL
Anion gap: 9 (ref 5–15)
BUN: 5 mg/dL — AB (ref 6–20)
CHLORIDE: 107 mmol/L (ref 101–111)
CO2: 23 mmol/L (ref 22–32)
CREATININE: 0.55 mg/dL — AB (ref 0.61–1.24)
Calcium: 8.1 mg/dL — ABNORMAL LOW (ref 8.9–10.3)
GFR calc Af Amer: >60 mL/min (ref >60)
GFR calc non Af Amer: >60 mL/min (ref >60)
GLUCOSE: 130 mg/dL — AB (ref 65–99)
Potassium: 3.2 mmol/L — ABNORMAL LOW (ref 3.5–5.1)
SODIUM: 139 mmol/L (ref 135–145)

## 2017-11-01 LAB — GLUCOSE, CAPILLARY
GLUCOSE-CAPILLARY: 159 mg/dL — AB (ref 65–99)
Glucose-Capillary: 120 mg/dL — ABNORMAL HIGH (ref 65–99)

## 2017-11-01 MED ORDER — CEFDINIR 300 MG PO CAPS: 300.0000 mg | ORAL_CAPSULE | Freq: Two times a day (BID) | ORAL | Status: DC

## 2017-11-01 MED ORDER — CEFDINIR 300 MG PO CAPS: 300.0000 mg | ORAL_CAPSULE | Freq: Two times a day (BID) | ORAL | 0 refills | Status: AC

## 2017-11-01 MED ORDER — FUROSEMIDE 20 MG PO TABS: 20.0000 mg | ORAL_TABLET | Freq: Every day | ORAL | 0 refills | Status: DC | PRN

## 2017-11-01 MED ORDER — NICOTINE 14 MG/24HR TD PT24: 14.0000 mg | MEDICATED_PATCH | Freq: Every day | TRANSDERMAL | 0 refills | Status: DC

## 2017-11-01 MED ORDER — CEFDINIR 300 MG PO CAPS
300.0000 mg | ORAL_CAPSULE | Freq: Two times a day (BID) | ORAL | Status: DC
  Filled 2017-11-01: qty 1

## 2017-11-01 MED ORDER — POTASSIUM CHLORIDE CRYS ER 20 MEQ PO TBCR
40.0000 meq | EXTENDED_RELEASE_TABLET | Freq: Once | ORAL | Status: AC
  Administered 2017-11-01: 40 meq via ORAL
  Filled 2017-11-01: qty 2

## 2017-11-01 MED ORDER — PREGABALIN 25 MG PO CAPS: 25.0000 mg | ORAL_CAPSULE | Freq: Every day | ORAL | 0 refills | Status: DC

## 2017-11-01 NOTE — Progress Notes (Signed)
Called pt wife, pt wife on way with wheelchair

## 2017-11-01 NOTE — Progress Notes (Signed)
Pt family at bedside  Discharge education went over at bedside with pt, pt wife and pt visitor  Pt has all belongings  Pt IV discontinued, catheter intact and telemetry removed Pt discharge via electric wheelchair from home with family

## 2017-11-02 NOTE — Discharge Summary (Signed)
[facility] Discharge Summary   Patient: Jaime [name] MVH:[id number]   PCP: [name], MD DOB: Nov 23, 1940   Date of admission: 10/27/2017   Date of discharge: 11/01/2017   Discharge Diagnoses:  Active Problems:   Transaminitis   Chronic systolic heart failure (HCC)   Septic shock (HCC)   Hypoglycemia   Bacteremia   Hyperchloremic acidosis   Admitted From: home Disposition:  home  Recommendations for Outpatient Follow-up:  1. Follow-up with PCP in 1-2 weeks.  Follow-up Information    [name], MD. Schedule an appointment as soon as possible for a visit in 1 week.   Specialty:  Internal Medicine Contact information: [place] [id number] [phone]          Diet recommendation: Cardiac diet  Activity: The patient is advised to gradually reintroduce usual activities.  Discharge Condition: good  Code Status: full code  History of present illness: As per the H and P dictated on admission, "77 yr old male with PMHx sig for HFrEF 66% ( diastolic and systolic dysfn), MS, DM, s/p bilat BKA s/p cholecystectomy in 2016, +tobacco h/o, PVD, CAD and FemTib bypass in 2013. Accompanied by his wife and 2 family members. History was obtained from the accounts given by patient, wife and his family members.   Pt states that it began as generalized diffuse non focal pain approx 24 hrs ago. His wife stated that he was "sweaty and clammy" at that time she wanted to bring him into the hospital but he refused to go. She also noted that his urine was darker than normal and red tinged. More recently today his c/o changed to a HA and abdominal pain +chills and fevers. Denies diarrhea, constipation, vomiting or nausea. Per his family members he became unresponsive around 3 pm."  Hospital Course:  Summary of his active problems in the hospital is as following. 1. Septic shock due to urinary tract infection. Klebsiella bacteremia. Recurrent fever. Blood cultures 1  out of 2 positive for Klebsiella. 1 out of 2 also positive for coagulase-negative staph-likely contamination. Urine culture shows multiple species. Admitted to ICU started on IV fluids required levofed. off the pressors and on the floor. pro calcitonin level is not significantly elevated.  Chest x-ray has made a comment regarding possible left lower lobe pneumonia although x-ray is rotated therefore I do not think there is a pneumonia there. No further fever after initial temp. Cultures remain negative for 48 hours as well. We will discontinue the patient's IV antibiotics and transition to oral Omnicef continue for a total of 10 days for initial improvement.  2. Hypertension Dyslipidemia Elevated troponin, CAD Chronic systolic CHF, echocardiogram shows 35% EF. Continue monitoring on telemetry. Cardiology consulted. Troponin now trending downwards. Echocardiogram performed, preserved EF, no WMA Cardiology feels this appears to be demand ischemia from sepsis. Medical conservative treatment recommended for now. Continue aspirin. Resume statin.  3.  Bilateral AKA. Phantom pain. I resumed patient's Lyrica, although we will reduce the dose due to patient's encephalopathy. Discontinue baclofen for now.  4.  Elevated LFT. Ultrasound negative for any acute abnormality. Monitor.  5.  Non-anion gap metabolic acidosis. Most likely after normal saline resuscitation. Resolved  Monitor.  6.  Thrombocytopenia. Acute in the setting of sepsis. Monitor. No evidence of active bleeding for now.  7.  Acute metabolic encephalopathy with a component of toxic encephalopathy due to drug. Likely in the setting of fever, use of Lyrica and baclofen. Reducing the dose  of Lyrica Discontinuing baclofen.  Fever subsided.  Monitor.  8.  Acute kidney injury. Baseline creatinine 0.5 to 0.7 Presented with serum creatinine of 1.22.  In the setting of septic shock. Given IV hydration. Now renal  function back to baseline.  All other chronic medical condition were stable during the hospitalization.  Patient was seen by physical therapy, who recommended no PT follow up needed. On the day of the discharge the patient's vitals were stable, and no other acute medical condition were reported by patient. the patient was felt safe to be discharge at home with home health.  Procedures and Results:  Echocardiogram   Consultations:  PCCM   Cardiology  DISCHARGE MEDICATION: Allergies as of 11/01/2017      Reactions   Bee Venom Anaphylaxis   Beta Adrenergic Blockers Other (See Comments)   Bradycardia with carvedilol   Carvedilol Other (See Comments)   Bradycardia with all Beta blockers    Influenza Vaccines Other (See Comments)   Sick for months   Atorvastatin    Severe pain in stumps      Medication List    STOP taking these medications   baclofen 10 MG tablet Commonly known as:  LIORESAL     TAKE these medications   albuterol 108 (90 Base) MCG/ACT inhaler Commonly known as:  PROVENTIL HFA;VENTOLIN HFA Inhale 2 puffs into the lungs every 6 (six) hours as needed for wheezing or shortness of breath.   aspirin EC 81 MG tablet Take 1 tablet (81 mg total) by mouth daily.   azelastine 0.1 % nasal spray Commonly known as:  ASTELIN Place 2 sprays into both nostrils at bedtime as needed for rhinitis. Use in each nostril as directed   cefdinir 300 MG capsule Commonly known as:  OMNICEF Take 1 capsule (300 mg total) by mouth 2 (two) times daily for 9 days.   diclofenac sodium 1 % Gel Commonly known as:  VOLTAREN Apply 1 application topically 2 (two) times daily as needed (pain). For pain   furosemide 20 MG tablet Commonly known as:  LASIX Take 1 tablet (20 mg total) by mouth daily as needed for fluid or edema (or shortness of breath). What changed:  See the new instructions.   GLUCAGEN 1 MG Solr injection Generic drug:  glucagon Inject 1 mg into the vein once as  needed for low blood sugar. Reported on 11/16/2015   insulin aspart protamine - aspart (70-30) 100 UNIT/ML FlexPen Commonly known as:  NOVOLOG 70/30 MIX Inject 0.05 mLs (5 Units total) into the skin 2 (two) times daily.   lisinopril 10 MG tablet Commonly known as:  PRINIVIL,ZESTRIL TAKE 1 TABLET BY MOUTH EVERY DAY What changed:    how much to take  how to take this  when to take this   multivitamin with minerals tablet Take 1 tablet by mouth daily.   nicotine 14 mg/24hr patch Commonly known as:  NICODERM CQ - dosed in mg/24 hours Place 1 patch (14 mg total) onto the skin daily.   pantoprazole 40 MG tablet Commonly known as:  PROTONIX Take 1 tablet (40 mg total) by mouth daily with lunch.   pregabalin 25 MG capsule Commonly known as:  LYRICA Take 1 capsule (25 mg total) by mouth daily. What changed:    medication strength  how much to take  how to take this  when to take this  additional instructions   rosuvastatin 20 MG tablet Commonly known as:  CRESTOR Take 1 tablet (20 mg  total) by mouth daily.   traMADol 50 MG tablet Commonly known as:  ULTRAM Take 1 tablet (50 mg total) by mouth every 4 (four) hours as needed.   Vitamin D 2000 units Caps Take 1 capsule by mouth daily.      Allergies  Allergen Reactions  . Bee Venom Anaphylaxis  . Beta Adrenergic Blockers Other (See Comments)    Bradycardia with carvedilol  . Carvedilol Other (See Comments)    Bradycardia with all Beta blockers   . Influenza Vaccines Other (See Comments)    Sick for months  . Atorvastatin     Severe pain in stumps   Discharge Instructions    Diet - low sodium heart healthy   Complete by:  As directed    Discharge instructions   Complete by:  As directed    It is important that you read following instructions as well as go over your medication list with RN to help you understand your care after this hospitalization.  Discharge Instructions: Please follow-up with PCP in one  week  Please request your primary care physician to go over all Hospital Tests and Procedure/Radiological results at the follow up,  Please get all Hospital records sent to your PCP by signing hospital release before you go home.   Do not take more than prescribed Pain, Sleep and Anxiety Medications. You were cared for by a hospitalist during your hospital stay. If you have any questions about your discharge medications or the care you received while you were in the hospital after you are discharged, you can call the unit and ask to speak with the hospitalist on call if the hospitalist that took care of you is not available.  Once you are discharged, your primary care physician will handle any further medical issues. Please note that NO REFILLS for any discharge medications will be authorized once you are discharged, as it is imperative that you return to your primary care physician (or establish a relationship with a primary care physician if you do not have one) for your aftercare needs so that they can reassess your need for medications and monitor your lab values. You Must read complete instructions/literature along with all the possible adverse reactions/side effects for all the Medicines you take and that have been prescribed to you. Take any new Medicines after you have completely understood and accept all the possible adverse reactions/side effects. Wear Seat belts while driving. If you have smoked or chewed Tobacco in the last 2 yrs please stop smoking and/or stop any Recreational drug use.   Increase activity slowly   Complete by:  As directed      Discharge Exam: Filed Weights   10/30/17 0525 10/31/17 0405 11/01/17 0457  Weight: 65.2 kg (143 lb 11.8 oz) 65.9 kg (145 lb 4.5 oz) 65.7 kg (144 lb 13.5 oz)   Vitals:   11/01/17 0457 11/01/17 1125  BP: 135/85 (!) 151/99  Pulse: 76 69  Resp: 18 16  Temp: 98.3 F (36.8 C) 98.2 F (36.8 C)  SpO2: 98% (!) 84%   General: Appear in no  distress, no Rash; Oral Mucosa moist. Cardiovascular: S1 and S2 Present, no Murmur, no JVD Respiratory: Bilateral Air entry present and Clear to Auscultation, no Crackles, no wheezes Abdomen: Bowel Sound present, Soft and no tenderness Extremities: bilateral BKA Neurology: Grossly no focal neuro deficit.  The results of significant diagnostics from this hospitalization (including imaging, microbiology, ancillary and laboratory) are listed below for reference.    Significant  Diagnostic Studies: US Abdomen Limited  Result Date: 10/27/2017 CLINICAL DATA:  Elevated liver function tests, febrile. History of cholecystectomy. EXAM: ULTRASOUND ABDOMEN LIMITED RIGHT UPPER QUADRANT COMPARISON:  CT abdomen and pelvis November 01, 2014 FINDINGS: Gallbladder: Status post cholecystectomy. No fluid collections within gallbladder fossa. Common bile duct: Diameter: 5 mm Stable anechoic 13 mm cyst LEFT lobe of the liver. Within normal limits in parenchymal echogenicity. Portal vein is patent on color Doppler imaging with normal direction of blood flow towards the liver. 16 mm anechoic cyst upper pole RIGHT kidney with acoustic enhancement. IMPRESSION: 1. No acute process in the RIGHT upper quadrant; status post cholecystectomy. Electronically Signed   By: Elon Alas M.D.   On: 10/27/2017 19:21   Dg Chest Port 1 View  Result Date: 10/30/2017 CLINICAL DATA:  Shortness of Breath EXAM: PORTABLE CHEST 1 VIEW COMPARISON:  10/27/2017 FINDINGS: Cardiomegaly. Left lower lobe atelectasis or infiltrate. No confluent opacity on the right. No overt edema. No effusions or acute bony abnormality. IMPRESSION: Cardiomegaly. Left lower lobe atelectasis or pneumonia. Electronically Signed   By: Rolm Baptise M.D.   On: 10/30/2017 10:23   Dg Chest Port 1 View  Result Date: 10/27/2017 CLINICAL DATA:  Bradycardia.  Sepsis and fever. EXAM: PORTABLE CHEST 1 VIEW COMPARISON:  07/08/2016 FINDINGS: Normal heart size. No pleural effusion or  edema. No airspace opacities identified. Review of the visualized osseous structures is unremarkable. IMPRESSION: 1. No acute cardiopulmonary abnormality. Electronically Signed   By: Kerby Moors M.D.   On: 10/27/2017 18:45    Microbiology: Recent Results (from the past 240 hour(s))  Blood Culture (routine x 2)     Status: Abnormal   Collection Time: 10/27/17  4:56 PM  Result Value Ref Range Status   Specimen Description BLOOD LEFT JUGULAR  Final   Special Requests   Final    BOTTLES DRAWN AEROBIC AND ANAEROBIC Blood Culture adequate volume   Culture  Setup Time   Final    GRAM NEGATIVE RODS AEROBIC BOTTLE ONLY CRITICAL RESULT CALLED TO, READ BACK BY AND VERIFIED WITH: Leonie Green, RPHARMD AT 0850 ON 10/28/17 BY C.JESSUP, MLT. GRAM POSITIVE COCCI ANAEROBIC BOTTLE ONLY IDENTIFICATION TO FOLLOW CRITICAL RESULT CALLED TO, READ BACK BY AND VERIFIED WITH: Jerene Bears PharmD 17:05 10/28/17 ([email or web address])    Culture (A)  Final    KLEBSIELLA PNEUMONIAE STAPHYLOCOCCUS SPECIES (COAGULASE NEGATIVE) THE SIGNIFICANCE OF ISOLATING THIS ORGANISM FROM A SINGLE SET OF BLOOD CULTURES WHEN MULTIPLE SETS ARE DRAWN IS UNCERTAIN. PLEASE NOTIFY THE MICROBIOLOGY DEPARTMENT WITHIN ONE WEEK IF SPECIATION AND SENSITIVITIES ARE REQUIRED. Performed at [facility], [place] [address], [place], [place] [address]    Report Status 10/30/2017 FINAL  Final   Organism ID, Bacteria KLEBSIELLA PNEUMONIAE  Final      Susceptibility   Klebsiella pneumoniae - MIC*    AMPICILLIN RESISTANT Resistant     CEFAZOLIN <=4 SENSITIVE Sensitive     CEFEPIME <=1 SENSITIVE Sensitive     CEFTAZIDIME <=1 SENSITIVE Sensitive     CEFTRIAXONE <=1 SENSITIVE Sensitive     CIPROFLOXACIN <=0.25 SENSITIVE Sensitive     GENTAMICIN <=1 SENSITIVE Sensitive     IMIPENEM <=0.25 SENSITIVE Sensitive     TRIMETH/SULFA <=20 SENSITIVE Sensitive     AMPICILLIN/SULBACTAM 4 SENSITIVE Sensitive     PIP/TAZO <=4 SENSITIVE Sensitive     Extended ESBL NEGATIVE  Sensitive     * KLEBSIELLA PNEUMONIAE  Blood Culture ID Panel (Reflexed)     Status: Abnormal  Collection Time: 10/27/17  4:56 PM  Result Value Ref Range Status   Enterococcus species NOT DETECTED NOT DETECTED Final   Listeria monocytogenes NOT DETECTED NOT DETECTED Final   Staphylococcus species NOT DETECTED NOT DETECTED Final   Staphylococcus aureus NOT DETECTED NOT DETECTED Final   Streptococcus species NOT DETECTED NOT DETECTED Final   Streptococcus agalactiae NOT DETECTED NOT DETECTED Final   Streptococcus pneumoniae NOT DETECTED NOT DETECTED Final   Streptococcus pyogenes NOT DETECTED NOT DETECTED Final   Acinetobacter baumannii NOT DETECTED NOT DETECTED Final   Enterobacteriaceae species DETECTED (A) NOT DETECTED Final    Comment: Enterobacteriaceae represent a large family of gram-negative bacteria, not a single organism. CRITICAL RESULT CALLED TO, READ BACK BY AND VERIFIED WITH: Leonie Green, RPHARMD AT 0850 ON 10/28/17 BY C. JESSUP, MLT.    Enterobacter cloacae complex NOT DETECTED NOT DETECTED Final   Escherichia coli NOT DETECTED NOT DETECTED Final   Klebsiella oxytoca NOT DETECTED NOT DETECTED Final   Klebsiella pneumoniae DETECTED (A) NOT DETECTED Final    Comment: CRITICAL RESULT CALLED TO, READ BACK BY AND VERIFIED WITH: M. TURNER, RPHARMD AT 0850 ON 10/28/17 BY C. JESSUP, MLT.    Proteus species NOT DETECTED NOT DETECTED Final   Serratia marcescens NOT DETECTED NOT DETECTED Final   Carbapenem resistance NOT DETECTED NOT DETECTED Final   Haemophilus influenzae NOT DETECTED NOT DETECTED Final   Neisseria meningitidis NOT DETECTED NOT DETECTED Final   Pseudomonas aeruginosa NOT DETECTED NOT DETECTED Final   Candida albicans NOT DETECTED NOT DETECTED Final   Candida glabrata NOT DETECTED NOT DETECTED Final   Candida krusei NOT DETECTED NOT DETECTED Final   Candida parapsilosis NOT DETECTED NOT DETECTED Final   Candida tropicalis NOT DETECTED NOT DETECTED Final    Comment:  Performed at [facility], [place] [address] [place], [place] [address]  Blood Culture ID Panel (Reflexed)     Status: Abnormal   Collection Time: 10/27/17  4:56 PM  Result Value Ref Range Status   Enterococcus species NOT DETECTED NOT DETECTED Final   Listeria monocytogenes NOT DETECTED NOT DETECTED Final   Staphylococcus species DETECTED (A) NOT DETECTED Final    Comment: Methicillin (oxacillin) susceptible coagulase negative staphylococcus. Possible blood culture contaminant (unless isolated from more than one blood culture draw or clinical case suggests pathogenicity). No antibiotic treatment is indicated for blood  culture contaminants. CRITICAL RESULT CALLED TO, READ BACK BY AND VERIFIED WITH: Jerene Bears PharmD 17:05 10/28/17 ([email or web address])    Staphylococcus aureus NOT DETECTED NOT DETECTED Final   Methicillin resistance NOT DETECTED NOT DETECTED Final   Streptococcus species NOT DETECTED NOT DETECTED Final   Streptococcus agalactiae NOT DETECTED NOT DETECTED Final   Streptococcus pneumoniae NOT DETECTED NOT DETECTED Final   Streptococcus pyogenes NOT DETECTED NOT DETECTED Final   Acinetobacter baumannii NOT DETECTED NOT DETECTED Final   Enterobacteriaceae species NOT DETECTED NOT DETECTED Final   Enterobacter cloacae complex NOT DETECTED NOT DETECTED Final   Escherichia coli NOT DETECTED NOT DETECTED Final   Klebsiella oxytoca NOT DETECTED NOT DETECTED Final   Klebsiella pneumoniae NOT DETECTED NOT DETECTED Final   Proteus species NOT DETECTED NOT DETECTED Final   Serratia marcescens NOT DETECTED NOT DETECTED Final   Haemophilus influenzae NOT DETECTED NOT DETECTED Final   Neisseria meningitidis NOT DETECTED NOT DETECTED Final   Pseudomonas aeruginosa NOT DETECTED NOT DETECTED Final   Candida albicans NOT DETECTED NOT DETECTED Final   Candida glabrata NOT DETECTED NOT DETECTED Final  Candida krusei NOT DETECTED NOT DETECTED Final   Candida parapsilosis NOT DETECTED NOT DETECTED  Final   Candida tropicalis NOT DETECTED NOT DETECTED Final    Comment: Performed at [facility], [place] [address], [place], [place] [address]  Blood Culture (routine x 2)     Status: None   Collection Time: 10/27/17  4:57 PM  Result Value Ref Range Status   Specimen Description BLOOD RIGHT ANTECUBITAL  Final   Special Requests IN PEDIATRIC BOTTLE Blood Culture adequate volume  Final   Culture   Final    NO GROWTH 5 DAYS Performed at [facility], [place] [address] [place], [place] [address]    Report Status 11/01/2017 FINAL  Final  Urine culture     Status: Abnormal   Collection Time: 10/27/17  4:57 PM  Result Value Ref Range Status   Specimen Description URINE, CLEAN CATCH  Final   Special Requests   Final    NONE Performed at [facility], [place] [address], [place], [place] [address]    Culture MULTIPLE SPECIES PRESENT, SUGGEST RECOLLECTION (A)  Final   Report Status 10/28/2017 FINAL  Final  MRSA PCR Screening     Status: None   Collection Time: 10/27/17 11:00 PM  Result Value Ref Range Status   MRSA by PCR NEGATIVE NEGATIVE Final    Comment:        The GeneXpert MRSA Assay (FDA approved for NASAL specimens only), is one component of a comprehensive MRSA colonization surveillance program. It is not intended to diagnose MRSA infection nor to guide or monitor treatment for MRSA infections. Performed at [facility], [place] [address] [place], [place] [address]   Culture, blood (routine x 2)     Status: None (Preliminary result)   Collection Time: 10/30/17  9:45 AM  Result Value Ref Range Status   Specimen Description BLOOD RIGHT ANTECUBITAL  Final   Special Requests   Final    BOTTLES DRAWN AEROBIC ONLY Blood Culture adequate volume   Culture   Final    NO GROWTH 3 DAYS Performed at [facility], [place] [address], [place], [place] [address]    Report Status PENDING  Incomplete  Culture, blood (routine x 2)     Status: None (Preliminary result)    Collection Time: 10/30/17  9:50 AM  Result Value Ref Range Status   Specimen Description BLOOD RIGHT ANTECUBITAL  Final   Special Requests   Final    BOTTLES DRAWN AEROBIC ONLY Blood Culture adequate volume   Culture   Final    NO GROWTH 3 DAYS Performed at [facility], [place] [address] [place], [place] [address]    Report Status PENDING  Incomplete     Labs: CBC: Recent Labs  Lab 10/28/17 0333 10/29/17 0742 10/30/17 0524 10/31/17 0416 11/01/17 0432  WBC 10.2 6.8 4.6 5.2 5.8  NEUTROABS  --   --  3.1  --   --   HGB 13.1 11.3* 13.0 11.5* 11.4*  HCT 39.8 34.1* 37.6* 34.2* 33.5*  MCV 78.8 78.4 77.0* 76.3* 75.3*  PLT 120* 107* 115* 139* 027   Basic Metabolic Panel: Recent Labs  Lab 10/27/17 1909 10/28/17 0333 10/29/17 0220 10/29/17 1916 10/30/17 0524 10/31/17 0416 11/01/17 0432  NA  --  141 135 137 138 137 139  K  --  4.8 5.2* 4.8 3.4* 3.4* 3.2*  CL  --  116* 109 111 108 107 107  CO2  --  14* 17* 16* 20* 21* 23  GLUCOSE  --  87 159* 213* 103* 178* 130*  BUN  --  17 11 8  <5* 9 5*  CREATININE  --  0.77 0.57* 0.58* 0.64 0.68 0.55*  CALCIUM  --  8.1* 7.8* 8.0* 8.1* 8.1* 8.1*  MG 0.8* 2.4  --   --  1.9  --   --   PHOS  --  2.4*  --   --   --   --   --    Liver Function Tests: Recent Labs  Lab 10/27/17 1614 10/28/17 0333 10/30/17 0524  AST 274* 211* 102*  ALT 312* 275* 172*  ALKPHOS 148* 134* 128*  BILITOT 4.0* 4.5* 2.1*  PROT 5.7* 5.9* 6.1*  ALBUMIN 2.7* 2.8* 2.7*   No results for input(s): LIPASE, AMYLASE in the last 168 hours. No results for input(s): AMMONIA in the last 168 hours. Cardiac Enzymes: Recent Labs  Lab 10/28/17 1256 10/28/17 1920 10/29/17 0220 10/29/17 0742 10/29/17 1500  TROPONINI 0.44* 1.03* 1.12* 1.29* 0.70*   BNP (last 3 results) No results for input(s): BNP in the last 8760 hours. CBG: Recent Labs  Lab 10/31/17 1127 10/31/17 1640 10/31/17 2127 11/01/17 0723 11/01/17 1122  GLUCAP 186* 150* 191* 120* 159*   Time  spent: 35 minutes  Signed:  Berle Mull  [facility] 11/01/2017, 5:55 PM

## 2017-11-03 ENCOUNTER — Telehealth: Payer: Self-pay | Admitting: *Deleted

## 2017-11-03 DIAGNOSIS — N390 Urinary tract infection, site not specified: Secondary | ICD-10-CM | POA: Diagnosis not present

## 2017-11-03 DIAGNOSIS — R339 Retention of urine, unspecified: Secondary | ICD-10-CM | POA: Diagnosis not present

## 2017-11-03 DIAGNOSIS — F0390 Unspecified dementia without behavioral disturbance: Secondary | ICD-10-CM | POA: Diagnosis not present

## 2017-11-03 DIAGNOSIS — Z89611 Acquired absence of right leg above knee: Secondary | ICD-10-CM | POA: Diagnosis not present

## 2017-11-03 DIAGNOSIS — Z794 Long term (current) use of insulin: Secondary | ICD-10-CM | POA: Diagnosis not present

## 2017-11-03 DIAGNOSIS — E1151 Type 2 diabetes mellitus with diabetic peripheral angiopathy without gangrene: Secondary | ICD-10-CM | POA: Diagnosis not present

## 2017-11-03 DIAGNOSIS — I110 Hypertensive heart disease with heart failure: Secondary | ICD-10-CM | POA: Diagnosis not present

## 2017-11-03 DIAGNOSIS — D696 Thrombocytopenia, unspecified: Secondary | ICD-10-CM | POA: Diagnosis not present

## 2017-11-03 DIAGNOSIS — I272 Pulmonary hypertension, unspecified: Secondary | ICD-10-CM | POA: Diagnosis not present

## 2017-11-03 DIAGNOSIS — Z87891 Personal history of nicotine dependence: Secondary | ICD-10-CM | POA: Diagnosis not present

## 2017-11-03 DIAGNOSIS — G35 Multiple sclerosis: Secondary | ICD-10-CM | POA: Diagnosis not present

## 2017-11-03 DIAGNOSIS — Z89612 Acquired absence of left leg above knee: Secondary | ICD-10-CM | POA: Diagnosis not present

## 2017-11-03 DIAGNOSIS — I2510 Atherosclerotic heart disease of native coronary artery without angina pectoris: Secondary | ICD-10-CM | POA: Diagnosis not present

## 2017-11-03 DIAGNOSIS — G4733 Obstructive sleep apnea (adult) (pediatric): Secondary | ICD-10-CM | POA: Diagnosis not present

## 2017-11-03 DIAGNOSIS — E785 Hyperlipidemia, unspecified: Secondary | ICD-10-CM | POA: Diagnosis not present

## 2017-11-03 DIAGNOSIS — I5022 Chronic systolic (congestive) heart failure: Secondary | ICD-10-CM | POA: Diagnosis not present

## 2017-11-03 DIAGNOSIS — I255 Ischemic cardiomyopathy: Secondary | ICD-10-CM | POA: Diagnosis not present

## 2017-11-03 NOTE — Telephone Encounter (Signed)
Tried calling pt to make TCM Hosp f/u appt no answer LMOM RTC to schedule.Marland KitchenJohny Chess

## 2017-11-04 LAB — CULTURE, BLOOD (ROUTINE X 2)
CULTURE: NO GROWTH
Culture: NO GROWTH
SPECIAL REQUESTS: ADEQUATE
SPECIAL REQUESTS: ADEQUATE

## 2017-11-04 NOTE — Telephone Encounter (Signed)
Transition Care Management Follow-up Telephone Call   Date discharged? 11/01/17   How have you been since you were released from the hospital? [place] w.pt wife she states husband is doing alright   Do you understand why you were in the hospital? YES   Do you understand the discharge instructions? YES   Where were you discharged to? Home   Items Reviewed:  Medications reviewed: YES  Allergies reviewed: YES  Dietary changes reviewed: NO  Referrals reviewed: No referral needed   Functional Questionnaire:   Activities of Daily Living (ADLs):   she states he are independent in the following: feeding and toileting States they require assistance with the following: ambulation, bathing and hygiene, grooming and dressing   Any transportation issues/concerns?: NO   Any patient concerns? NO   Confirmed importance and date/time of follow-up visits scheduled YES, 11/20/17  Provider Appointment booked with [name]  Confirmed with patient if condition begins to worsen call PCP or go to the ER.  Patient was given the office number and encouraged to call back with question or concerns.  : YES

## 2017-11-05 ENCOUNTER — Telehealth: Payer: Self-pay | Admitting: Internal Medicine

## 2017-11-05 NOTE — Telephone Encounter (Signed)
Spoke with Kendrick Fries to give verbal orders per MD

## 2017-11-05 NOTE — Telephone Encounter (Signed)
Copied from [place]. Topic: Inquiry >> Nov 05, 2017  9:30 AM Scherrie Gerlach wrote: Reason for CRM: Kendrick Fries with [facility] opened pt case up yesterday for home health nursing.  Reason: congestive heart failure, med management, DM training, disease management PT  Frequency of visits 2 wk / 3 1 wk / 5

## 2017-11-07 DIAGNOSIS — I5022 Chronic systolic (congestive) heart failure: Secondary | ICD-10-CM | POA: Diagnosis not present

## 2017-11-07 DIAGNOSIS — I255 Ischemic cardiomyopathy: Secondary | ICD-10-CM | POA: Diagnosis not present

## 2017-11-07 DIAGNOSIS — G35 Multiple sclerosis: Secondary | ICD-10-CM | POA: Diagnosis not present

## 2017-11-07 DIAGNOSIS — I2510 Atherosclerotic heart disease of native coronary artery without angina pectoris: Secondary | ICD-10-CM | POA: Diagnosis not present

## 2017-11-07 DIAGNOSIS — E1151 Type 2 diabetes mellitus with diabetic peripheral angiopathy without gangrene: Secondary | ICD-10-CM | POA: Diagnosis not present

## 2017-11-07 DIAGNOSIS — I110 Hypertensive heart disease with heart failure: Secondary | ICD-10-CM | POA: Diagnosis not present

## 2017-11-10 ENCOUNTER — Other Ambulatory Visit: Payer: Self-pay

## 2017-11-10 ENCOUNTER — Telehealth: Payer: Self-pay | Admitting: Neurology

## 2017-11-10 NOTE — Patient Outreach (Signed)
[facility]) Care Management  11/10/2017  Jaime [name] 12-Mar-1941 [id number]     EMMI-HF RED ON EMMI ALERT Day # 6 Date: 11/09/17 Red Alert Reason: "Weighed themselves today? No"   Outreach attempt # 1 to patient. Spoke with patient. He stets that he is doing well. Reviewed and addressed red alert with patient. He voices that his spouse has been answering calls as she manages his care. Patient with history of bilateral BKA. RN CM confirmed with patient that he is an amputee and does not use prosthesis. He is physically unable to stand and weigh himself. Advised patient that he was not a goo candidate for automated EMMI-HF daily calls as he is unable to weigh in the home. Advised him that RN CM would have automated calls stopped. He voiced understanding. He states the has PCP follow up appt next week. His wife will be taking him to appts. He reports that he has all his meds. His spouse helps him manage his meds and he is taking them as directed. [place] services have been out to see patient. He denies any further RN CM needs or concerns at this time. RN CM reviewed with patient s/s of worsening condition and when to seek medical attention. He voiced understanding and was appreciative of follow up call.        Plan: RN CM will close case at this time as no further interventions needed. RN CM will notify [facility] administrative assistant to deactivate automated EMMI-HF calls.    Enzo Montgomery, RN,BSN,CCM [place] Management Telephonic Care Management Coordinator Direct Phone: [phone] Toll Free: [phone] Fax: [phone]

## 2017-11-10 NOTE — Telephone Encounter (Signed)
Adelard Sanon left a VM message saying pt was in the hospital and his Orland Penman was change to 25mg  and pt wants to know after that is complete does he continue with the 150mg  as prescribed by Dr [name], please advise

## 2017-11-10 NOTE — Telephone Encounter (Signed)
Called and spoke with Jaime [name]. She states the home health nurse who is coming to help them, advsd her to call and check with Jaime [name] about Lyrica. Jaime [name] had Pt taking 150mg  in AM, 300mg  in PM. Pt states it helps his pain, but the dose the hosp[italist has him on, 25mg  does not help at all. They want to return to the dose Jaime [name] prescribed. Jaime [name] is aware call back will be tomorrow, she asked I leave a message if she is unable to answer.

## 2017-11-11 NOTE — Telephone Encounter (Signed)
It appears that they reduced the dose due to his confusion related to the UTI.  If he is back to baseline, he may increase the Lyrica to previous dose.

## 2017-11-11 NOTE — Telephone Encounter (Signed)
Calleed and LMOVM for [name] advising ok to resume previous dose of Lyrica if Pt's confusion and UTI have resolved. Advsd her to call with any further questions.

## 2017-11-12 DIAGNOSIS — I110 Hypertensive heart disease with heart failure: Secondary | ICD-10-CM | POA: Diagnosis not present

## 2017-11-12 DIAGNOSIS — I5022 Chronic systolic (congestive) heart failure: Secondary | ICD-10-CM | POA: Diagnosis not present

## 2017-11-12 DIAGNOSIS — G35 Multiple sclerosis: Secondary | ICD-10-CM | POA: Diagnosis not present

## 2017-11-12 DIAGNOSIS — E1151 Type 2 diabetes mellitus with diabetic peripheral angiopathy without gangrene: Secondary | ICD-10-CM | POA: Diagnosis not present

## 2017-11-12 DIAGNOSIS — I2510 Atherosclerotic heart disease of native coronary artery without angina pectoris: Secondary | ICD-10-CM | POA: Diagnosis not present

## 2017-11-12 DIAGNOSIS — I255 Ischemic cardiomyopathy: Secondary | ICD-10-CM | POA: Diagnosis not present

## 2017-11-14 DIAGNOSIS — I5022 Chronic systolic (congestive) heart failure: Secondary | ICD-10-CM | POA: Diagnosis not present

## 2017-11-14 DIAGNOSIS — G35 Multiple sclerosis: Secondary | ICD-10-CM | POA: Diagnosis not present

## 2017-11-14 DIAGNOSIS — E1151 Type 2 diabetes mellitus with diabetic peripheral angiopathy without gangrene: Secondary | ICD-10-CM | POA: Diagnosis not present

## 2017-11-14 DIAGNOSIS — I2510 Atherosclerotic heart disease of native coronary artery without angina pectoris: Secondary | ICD-10-CM | POA: Diagnosis not present

## 2017-11-14 DIAGNOSIS — I110 Hypertensive heart disease with heart failure: Secondary | ICD-10-CM | POA: Diagnosis not present

## 2017-11-14 DIAGNOSIS — I255 Ischemic cardiomyopathy: Secondary | ICD-10-CM | POA: Diagnosis not present

## 2017-11-16 NOTE — Progress Notes (Deleted)
Subjective:    Patient ID: Jaime [name], male    DOB: [birth year], 77 y.o.   MRN: [id number]  HPI The patient is here for follow up from the hospital.   Admitted 10/27/17 - 11/01/17 for septic shock due to a UTI.  One day prior to admission he was having diffuse non focal pain, he was sweaty and clammy, urine is darker than normal and red tinged.  The day of admission he had a headache, abdominal pain, chills and fever.  He became unresponsive that day h went to the ED.    Septic shock due to klebsiella UTI: Blood cultures 1/2 positive Urine culture showed multiple species Admitted to ICU, started on IVF, needed Levophed CXR showed possible LLL PNA Repeat Culture negative IV Abx transitioned to Omnicef  Hypertension, Dyslipidemia, elevated troponin, CAD, chronic systolic CHF, Echo EF 32%: Was on telemetry Cardiology consulted Troponin trended down - likely from demand ischemia from sepsis Medical treatment recommended continued on ASA, statin  Bilateral AKA: Phantom pain On Lyrica - dose decreased due to encephalopathy Baclofen discontinued  Elevated LFT: [place] neg for acute abnormality Monitor  Non-anion gap metabolic acidosis: Resolved  Thrombocytopenia: Acute in setting of sepsis, no evidence of active bleeding Monitor  Acute metabolic encephalopathy with a component of toxic encephalopathy due to drug: Likely related to fever, use of lyrica and baclofen lyrica dose decreased Baclofen discontinued  Acute kidney function: Baseline 0.5-0.7, in ED Cr 1.22 related to septic shock IVF, renal function returned to normal  Seen by PT and they recommended no PT follow up    Medications and allergies reviewed with patient and updated if appropriate.  Patient Active Problem List   Diagnosis Date Noted  . Hypoglycemia   . Bacteremia   . Hyperchloremic acidosis   . Cool skin 07/18/2017  . Hematuria 04/29/2017  . Septic shock ([place]) 07/01/2016  . GERD  (gastroesophageal reflux disease) 04/10/2016  . Coronary artery disease involving native coronary artery of native heart without angina pectoris 12/28/2015  . PAD (peripheral artery disease) ([place]) 12/28/2015  . Bradycardia 08/14/2015  . Chronic systolic heart failure ([place]) 08/14/2015  . Phantom limb pain ([place]) 12/08/2014  . Syncope 11/01/2014  . Transaminitis 08/18/2014  . 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 Paraparesis of both lower limbs ([place]) 05/31/2014  . Scoliosis, thoracogenic, acquired 03/15/2014  . Mild cognitive impairment 02/11/2014  . PVD (peripheral vascular disease) ([place]) 08/13/2013  . S/P AKA (above knee amputation) bilateral ([place]) 08/03/2012  . PAC (premature atrial contraction)   . Peripheral vascular disease, unspecified ([place]) 07/03/2012  . 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 Atherosclerosis of native arteries of the extremities with ulceration(440.23) 04/17/2012  . Ejection fraction   . Cardiomyopathy, ischemic   . Pulmonary hypertension ([place])   . Urinary retention   . Hemiparesis ([place])   . Essential hypertension   . Urinary tract infection after immobility, UCX MRSA 03/26/2012  . Carotid artery disease ([place])   . Hyperlipidemia 07/31/2007  . BPH (benign prostatic hyperplasia) 04/15/2007  . DMII (diabetes mellitus, type 2) ([employer]) 12/18/2006    Current Outpatient Medications on File Prior to Visit  Medication Sig Dispense Refill  . albuterol (PROVENTIL HFA;VENTOLIN HFA) 108 (90 Base) MCG/ACT inhaler Inhale 2 puffs into the lungs every 6 (six) hours as needed for wheezing or shortness of breath. 1 Inhaler 2  . aspirin EC 81 MG tablet Take 1 tablet (81 mg total) by mouth daily. [place]  tablet 3  . azelastine (ASTELIN) 0.1 % nasal spray Place 2 sprays into both nostrils at bedtime as needed for rhinitis. Use in each nostril as directed 30 mL 3  . Cholecalciferol (VITAMIN D) 2000 units CAPS Take 1 capsule by mouth  daily.     . diclofenac sodium (VOLTAREN) 1 % GEL Apply 1 application topically 2 (two) times daily as needed (pain). For pain    . furosemide (LASIX) 20 MG tablet Take 1 tablet (20 mg total) by mouth daily as needed for fluid or edema (or shortness of breath). 30 tablet 0  . glucagon (GLUCAGEN) 1 MG SOLR injection Inject 1 mg into the vein once as needed for low blood sugar. Reported on 11/16/2015    . insulin aspart protamine - aspart (NOVOLOG 70/30 MIX) (70-30) 100 UNIT/ML FlexPen Inject 0.05 mLs (5 Units total) into the skin 2 (two) times daily. 15 mL 5  . lisinopril (PRINIVIL,ZESTRIL) 10 MG tablet TAKE 1 TABLET BY MOUTH EVERY DAY (Patient taking differently: TAKE 10 mg TABLET BY MOUTH EVERY DAY) 30 tablet 5  . Multiple Vitamins-Minerals (MULTIVITAMIN WITH MINERALS) tablet Take 1 tablet by mouth daily.    . nicotine (NICODERM CQ - DOSED IN MG/24 HOURS) 14 mg/24hr patch Place 1 patch (14 mg total) onto the skin daily. 28 patch 0  . pantoprazole (PROTONIX) 40 MG tablet Take 1 tablet (40 mg total) by mouth daily with lunch. 90 tablet 1  . pregabalin (LYRICA) 25 MG capsule Take 1 capsule (25 mg total) by mouth daily. 30 capsule 0  . rosuvastatin (CRESTOR) 20 MG tablet Take 1 tablet (20 mg total) by mouth daily. 30 tablet 3  . traMADol (ULTRAM) 50 MG tablet Take 1 tablet (50 mg total) by mouth every 4 (four) hours as needed. 120 tablet 5   No current facility-administered medications on file prior to visit.     Past Medical History:  Diagnosis Date  . Atherosclerotic PVD with ulceration ([place])    left foot  . CAD (coronary artery disease) CARDIOLOGIST- DR KATZ-- VISIT 06-05-2011 IN [facility]   non-STEMI, 2007.Marland Kitchenoccluded circumflex.. Taxus stent placed...residual 80% LAD...50% RCA  . Cardiomyopathy, ischemic    EF 45% per ECHO 2008  //   EF 25%, echo, August, 2013 //  Echo (8/15):  Mild LVH, EF 30-35%, ant-lat and lat AK, inf HK, Gr 1 DD, mild MR, mild LAE  . Carotid artery disease ([place])    a.   Doppler, February, 2012, 0-39% bilateral,Mild smooth plaque;  b.  Carotid US (8/15):  Bilateral 1-39% ICA >>> F/u 2 years  . Chronic indwelling Foley catheter   . Constipation   . Dementia   . Diabetes mellitus    INSULIN-DEPENDent  . Fatigue SEVERE  . H/O pleural effusion 2008   POST THORACENTESIS  . History of colon polyps PRECANCEROUS  . Hyperlipidemia   . Hypertension   . Impotence   . Increased prostate specific antigen (PSA) velocity   . Insomnia   . Lung nodule    resolved 11-2006 CT Chest  . Multiple sclerosis ([place]) [place] -- LAST VISIT 11-20-2010  NOTE W/ CHART  . PAC (premature atrial contraction)    December, 2013  . Pulmonary hypertension ([place])    moderate ECHO Jan 2008  . Scoliosis associated with other condition   . Sleep apnea   . Systolic heart failure   . TIA (transient ischemic attack)   . Tobacco abuse  quit   . Urinary retention    dx ~ 2-12, like from [place], now with a catheter, saw urology  . Urinary tract infection    hx of    Past Surgical History:  Procedure Laterality Date  . ABDOMINAL ANGIOGRAM  12/17/2011   Procedure: ABDOMINAL ANGIOGRAM;  Surgeon: Serafina Mitchell, MD;  Location: [facility] CATH LAB;  Service: Cardiovascular;;  . ABDOMINAL AORTAGRAM N/A 08/19/2013   Procedure: ABDOMINAL Maxcine Ham;  Surgeon: Conrad Kelly, MD;  Location: [facility] CATH LAB;  Service: Cardiovascular;  Laterality: N/A;  . AMPUTATION  03/17/2012   Procedure: AMPUTATION ABOVE KNEE;  Surgeon: Conrad Kachemak, MD;  Location: [place];  Service: Vascular;  Laterality: Left;  . AMPUTATION  06/03/2012   Procedure: AMPUTATION ABOVE KNEE;  Surgeon: Conrad Curtisville, MD;  Location: [place];  Service: Vascular;  Laterality: Right;  . CHOLECYSTECTOMY N/A 10/25/2014   Procedure: LAPAROSCOPIC CHOLECYSTECTOMY ;  Surgeon: Coralie Keens, MD;  Location: [place];  Service: General;  Laterality: N/A;  . CORONARY ANGIOPLASTY WITH STENT PLACEMENT  05-01-2006   OCCLUDED CIRCUMFLEX --  TAXUS STENT PLACMENT  AND RESIDUAL 80% LAD,  50% RCA  . CYSTOSCOPY  07/30/2011   Procedure: CYSTOSCOPY;  Surgeon: Hanley Ben, MD;  Location: [facility];  Service: Urology;  Laterality: N/A;  . ERCP N/A 08/25/2014   Procedure: ENDOSCOPIC RETROGRADE CHOLANGIOPANCREATOGRAPHY (ERCP);  Surgeon: Ladene Artist, MD;  Location: [facility] ENDOSCOPY;  Service: Endoscopy;  Laterality: N/A;  . FEMORAL-POPLITEAL BYPASS GRAFT  03/11/2012   Procedure: BYPASS GRAFT FEMORAL-POPLITEAL ARTERY;  Surgeon: Conrad Mil[place] Mill, MD;  Location: [place];  Service: Vascular;  Laterality: Left;  embolectomy left lower leg  . FEMORAL-TIBIAL BYPASS GRAFT  03/11/2012   Procedure: BYPASS GRAFT FEMORAL-TIBIAL ARTERY;  Surgeon: Conrad Mayflower, MD;  Location: [facility] OR;  Service: Vascular;  Laterality: Left;  Left Femoral -Tibial trunk bypass, Endarterectomy of Tibial- Peroneal trunk with vein angioplasty.  Marland Kitchen HERNIA REPAIR  1990   (R)  . INTRAOPERATIVE ARTERIOGRAM  03/11/2012   Procedure: INTRA OPERATIVE ARTERIOGRAM;  Surgeon: Conrad Elkton, MD;  Location: [place];  Service: Vascular;  Laterality: Left;  . LOWER EXTREMITY ANGIOGRAM Bilateral 12/17/2011   Procedure: LOWER EXTREMITY ANGIOGRAM;  Surgeon: Serafina Mitchell, MD;  Location: [facility] CATH LAB;  Service: Cardiovascular;  Laterality: Bilateral;  bil lower extrem angio  . THORACENTESIS  2008   PLEURAL EFFUSION  . TRANSURETHRAL RESECTION OF PROSTATE  07/30/2011   Procedure: TRANSURETHRAL RESECTION OF THE PROSTATE (TURP);  Surgeon: Hanley Ben, MD;  Location: [facility];  Service: Urology;  Laterality: N/A;    Social History   Socioeconomic History  . Marital status: Married    Spouse name: Not on file  . Number of children: 2  . Years of education: Not on file  . Highest education level: Not on file  Occupational History  . Occupation: disable    Employer: RETIRED  Social Needs  . Financial resource strain: Not on file  . Food insecurity:    Worry: Not on file      Inability: Not on file  . Transportation needs:    Medical: Not on file    Non-medical: Not on file  Tobacco Use  . Smoking status: Former Smoker    Years: 50.00    Last attempt to quit: 07/22/2012    Years since quitting: 5.3  . Smokeless tobacco: Current User  . Tobacco comment: pt states that he is using E-cigs  Substance and Sexual Activity  . Alcohol use: No    Alcohol/week: 0.0 oz  . Drug use: No  . Sexual activity: Never    Comment: electronic cigarettes no nicotene  Lifestyle  . Physical activity:    Days per week: Not on file    Minutes per session: Not on file  . Stress: Not on file  Relationships  . Social connections:    Talks on phone: Not on file    Gets together: Not on file    Attends religious service: Not on file    Active member of club or organization: Not on file    Attends meetings of clubs or organizations: Not on file    Relationship status: Not on file  Other Topics Concern  . Not on file  Social History Narrative   Lives w/ wife    Family History  Problem Relation Age of Onset  . Heart attack Mother 28  . Heart disease Mother   . Stroke Mother   . Hyperlipidemia Mother   . Hypertension Mother   . COPD Father   . Peripheral vascular disease Father   . Diabetes Brother   . Heart disease Brother   . Hypertension Brother   . Heart attack Brother   . Diabetes Daughter   . Colon cancer Neg Hx   . Prostate cancer Neg Hx     Review of Systems     Objective:  There were no vitals filed for this visit. BP Readings from Last 3 Encounters:  11/01/17 (!) 151/99  07/18/17 130/68  06/10/17 122/84   Wt Readings from Last 3 Encounters:  11/01/17 144 lb 13.5 oz (65.7 kg)  10/29/16 160 lb (72.6 kg)  07/08/16 146 lb (66.2 kg)   There is no height or weight on file to calculate BMI.   Physical Exam    Constitutional: Appears well-developed and well-nourished. No distress.  HENT:  Head: Normocephalic and atraumatic.  Neck: Neck supple.  No tracheal deviation present. No thyromegaly present.  No cervical lymphadenopathy Cardiovascular: Normal rate, regular rhythm and normal heart sounds.   No murmur heard. No carotid bruit .  No edema Pulmonary/Chest: Effort normal and breath sounds normal. No respiratory distress. No has no wheezes. No rales.  Skin: Skin is warm and dry. Not diaphoretic.  Psychiatric: Normal mood and affect. Behavior is normal.      Assessment & Plan:    See Problem List for Assessment and Plan of chronic medical problems.

## 2017-11-17 DIAGNOSIS — F0390 Unspecified dementia without behavioral disturbance: Secondary | ICD-10-CM | POA: Diagnosis not present

## 2017-11-17 DIAGNOSIS — D696 Thrombocytopenia, unspecified: Secondary | ICD-10-CM | POA: Diagnosis not present

## 2017-11-17 DIAGNOSIS — G35 Multiple sclerosis: Secondary | ICD-10-CM | POA: Diagnosis not present

## 2017-11-17 DIAGNOSIS — E1151 Type 2 diabetes mellitus with diabetic peripheral angiopathy without gangrene: Secondary | ICD-10-CM | POA: Diagnosis not present

## 2017-11-17 DIAGNOSIS — R339 Retention of urine, unspecified: Secondary | ICD-10-CM | POA: Diagnosis not present

## 2017-11-17 DIAGNOSIS — N390 Urinary tract infection, site not specified: Secondary | ICD-10-CM | POA: Diagnosis not present

## 2017-11-17 DIAGNOSIS — I5022 Chronic systolic (congestive) heart failure: Secondary | ICD-10-CM | POA: Diagnosis not present

## 2017-11-17 DIAGNOSIS — I110 Hypertensive heart disease with heart failure: Secondary | ICD-10-CM | POA: Diagnosis not present

## 2017-11-17 DIAGNOSIS — I272 Pulmonary hypertension, unspecified: Secondary | ICD-10-CM | POA: Diagnosis not present

## 2017-11-17 DIAGNOSIS — G4733 Obstructive sleep apnea (adult) (pediatric): Secondary | ICD-10-CM | POA: Diagnosis not present

## 2017-11-17 DIAGNOSIS — I255 Ischemic cardiomyopathy: Secondary | ICD-10-CM | POA: Diagnosis not present

## 2017-11-17 DIAGNOSIS — I2510 Atherosclerotic heart disease of native coronary artery without angina pectoris: Secondary | ICD-10-CM | POA: Diagnosis not present

## 2017-11-20 ENCOUNTER — Inpatient Hospital Stay: Payer: Medicare Other | Admitting: Internal Medicine

## 2017-11-20 DIAGNOSIS — Z0289 Encounter for other administrative examinations: Secondary | ICD-10-CM

## 2017-11-21 DIAGNOSIS — E1151 Type 2 diabetes mellitus with diabetic peripheral angiopathy without gangrene: Secondary | ICD-10-CM | POA: Diagnosis not present

## 2017-11-21 DIAGNOSIS — G35 Multiple sclerosis: Secondary | ICD-10-CM | POA: Diagnosis not present

## 2017-11-21 DIAGNOSIS — I255 Ischemic cardiomyopathy: Secondary | ICD-10-CM | POA: Diagnosis not present

## 2017-11-21 DIAGNOSIS — I2510 Atherosclerotic heart disease of native coronary artery without angina pectoris: Secondary | ICD-10-CM | POA: Diagnosis not present

## 2017-11-21 DIAGNOSIS — I110 Hypertensive heart disease with heart failure: Secondary | ICD-10-CM | POA: Diagnosis not present

## 2017-11-21 DIAGNOSIS — I5022 Chronic systolic (congestive) heart failure: Secondary | ICD-10-CM | POA: Diagnosis not present

## 2017-11-24 DIAGNOSIS — N390 Urinary tract infection, site not specified: Secondary | ICD-10-CM | POA: Insufficient documentation

## 2017-11-24 DIAGNOSIS — B961 Klebsiella pneumoniae [K. pneumoniae] as the cause of diseases classified elsewhere: Secondary | ICD-10-CM

## 2017-11-24 NOTE — Progress Notes (Signed)
Subjective:    Patient ID: Jaime [name], male    DOB: 1941/03/18, 77 y.o.   MRN: [id number]  HPI The patient is here for follow up from the hospital.  His wife is here with him today.   Admitted 10/27/17 - 11/01/17 for septic shock due to a UTI.  One day prior to admission he was having diffuse non focal pain, he was sweaty and clammy, urine is darker than normal and red tinged.  The day of admission he had a headache, abdominal pain, chills and fever.  He became unresponsive that day he went to the ED.    The past few days he has been hot and sweaty.  He is always hot.  They relate that to his multiple sclerosis.  His urine looks normal.  The other day it was looking slightly darker, but he increased his water intake and it now looks normal.  He denies any abdominal pain, fevers or chills.  His appetite is at baseline.  Septic shock due to klebsiella UTI: Blood cultures 1/2 positive Urine culture showed multiple species Admitted to ICU, started on IVF, needed Levophed CXR showed possible LLL PNA Repeat Culture negative IV Abx transitioned to [facility] Completed antibiotics and has done well since  Hypertension, Dyslipidemia, elevated troponin, CAD, chronic systolic CHF, Echo EF 56%: Was on telemetry Cardiology consulted Troponin trended down - likely from demand ischemia from sepsis Medical treatment recommended continued on ASA, statin  Bilateral AKA: Phantom pain On Lyrica - dose decreased due to encephalopathy Baclofen discontinued  Elevated LFT: [place] neg for acute abnormality Monitor-recheck today  Non-anion gap metabolic acidosis: Resolved  Thrombocytopenia: Acute in setting of sepsis, no evidence of active bleeding Monitor-recheck today  Acute metabolic encephalopathy with a component of toxic encephalopathy due to drug: Likely related to fever, use of lyrica and baclofen lyrica dose decreased Baclofen discontinued  Acute kidney function: Baseline 0.5-0.7, in  ED Cr 1.22 related to septic shock IVF, renal function returned to normal CMP today  Seen by PT and they recommended no PT follow up.  He does not want to do any home PT.    Lump in throat:  It has been there for three years.  He has seen many doctors for it and nothing has worked.  He was told it was related to the MS.      Medications and allergies reviewed with patient and updated if appropriate.  Patient Active Problem List   Diagnosis Date Noted  . UTI due to Klebsiella species 11/24/2017  . Hypoglycemia   . Bacteremia   . Hyperchloremic acidosis   . Cool skin 07/18/2017  . Hematuria 04/29/2017  . Septic shock ([place]) 07/01/2016  . GERD (gastroesophageal reflux disease) 04/10/2016  . Coronary artery disease involving native coronary artery of native heart without angina pectoris 12/28/2015  . PAD (peripheral artery disease) ([place]) 12/28/2015  . Bradycardia 08/14/2015  . Chronic systolic heart failure ([place]) 08/14/2015  . Phantom limb pain ([place]) 12/08/2014  . Syncope 11/01/2014  . Transaminitis 08/18/2014  . Multiple sclerosis, secondary progressive ([place]) 06/10/2014  . Paraparesis of both lower limbs ([place]) 05/31/2014  . Scoliosis, thoracogenic, acquired 03/15/2014  . Mild cognitive impairment 02/11/2014  . PVD (peripheral vascular disease) ([place]) 08/13/2013  . S/P AKA (above knee amputation) bilateral ([place]) 08/03/2012  . PAC (premature atrial contraction)   . Peripheral vascular disease, unspecified ([place]) 07/03/2012  . 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 Atherosclerosis of native arteries  of the extremities with ulceration(440.23) 04/17/2012  . Ejection fraction   . Cardiomyopathy, ischemic   . Pulmonary hypertension ([place])   . Urinary retention   . Hemiparesis ([place])   . Essential hypertension   . Urinary tract infection after immobility, UCX MRSA 03/26/2012  . Carotid artery disease ([place])   . Hyperlipidemia 07/31/2007  . BPH (benign  prostatic hyperplasia) 04/15/2007  . DMII (diabetes mellitus, type 2) ([place]) 12/18/2006    Current Outpatient Medications on File Prior to Visit  Medication Sig Dispense Refill  . albuterol (PROVENTIL HFA;VENTOLIN HFA) 108 (90 Base) MCG/ACT inhaler Inhale 2 puffs into the lungs every 6 (six) hours as needed for wheezing or shortness of breath. 1 Inhaler 2  . aspirin EC 81 MG tablet Take 1 tablet (81 mg total) by mouth daily. 90 tablet 3  . azelastine (ASTELIN) 0.1 % nasal spray Place 2 sprays into both nostrils at bedtime as needed for rhinitis. Use in each nostril as directed 30 mL 3  . Cholecalciferol (VITAMIN D) 2000 units CAPS Take 1 capsule by mouth daily.     . diclofenac sodium (VOLTAREN) 1 % GEL Apply 1 application topically 2 (two) times daily as needed (pain). For pain    . furosemide (LASIX) 20 MG tablet Take 1 tablet (20 mg total) by mouth daily as needed for fluid or edema (or shortness of breath). 30 tablet 0  . glucagon (GLUCAGEN) 1 MG SOLR injection Inject 1 mg into the vein once as needed for low blood sugar. Reported on 11/16/2015    . insulin aspart protamine - aspart (NOVOLOG 70/30 MIX) (70-30) 100 UNIT/ML FlexPen Inject 0.05 mLs (5 Units total) into the skin 2 (two) times daily. 15 mL 5  . lisinopril (PRINIVIL,ZESTRIL) 10 MG tablet TAKE 1 TABLET BY MOUTH EVERY DAY (Patient taking differently: TAKE 10 mg TABLET BY MOUTH EVERY DAY) 30 tablet 5  . Multiple Vitamins-Minerals (MULTIVITAMIN WITH MINERALS) tablet Take 1 tablet by mouth daily.    . nicotine (NICODERM CQ - DOSED IN MG/24 HOURS) 14 mg/24hr patch Place 1 patch (14 mg total) onto the skin daily. 28 patch 0  . pantoprazole (PROTONIX) 40 MG tablet Take 1 tablet (40 mg total) by mouth daily with lunch. 90 tablet 1  . pregabalin (LYRICA) 25 MG capsule Take 1 capsule (25 mg total) by mouth daily. 30 capsule 0  . rosuvastatin (CRESTOR) 20 MG tablet Take 1 tablet (20 mg total) by mouth daily. 30 tablet 3  . traMADol (ULTRAM) 50  MG tablet Take 1 tablet (50 mg total) by mouth every 4 (four) hours as needed. 120 tablet 5   No current facility-administered medications on file prior to visit.     Past Medical History:  Diagnosis Date  . Atherosclerotic PVD with ulceration ([place])    left foot  . CAD (coronary artery disease) CARDIOLOGIST- DR KATZ-- VISIT 06-05-2011 IN [facility]   non-STEMI, 2007.Marland Kitchenoccluded circumflex.. Taxus stent placed...residual 80% LAD...50% RCA  . Cardiomyopathy, ischemic    EF 45% per ECHO 2008  //   EF 25%, echo, August, 2013 //  Echo (8/15):  Mild LVH, EF 30-35%, ant-lat and lat AK, inf HK, Gr 1 DD, mild MR, mild LAE  . Carotid artery disease ([place])    a.  Doppler, February, 2012, 0-39% bilateral,Mild smooth plaque;  b.  Carotid US (8/15):  Bilateral 1-39% ICA >>> F/u 2 years  . Chronic indwelling Foley catheter   . Constipation   . Dementia   . Diabetes  mellitus    INSULIN-DEPENDent  . Fatigue SEVERE  . H/O pleural effusion 2008   POST THORACENTESIS  . History of colon polyps PRECANCEROUS  . Hyperlipidemia   . Hypertension   . Impotence   . Increased prostate specific antigen (PSA) velocity   . Insomnia   . Lung nodule    resolved 11-2006 CT Chest  . Multiple sclerosis ([place]) [place] -- LAST VISIT 11-20-2010  NOTE W/ CHART  . PAC (premature atrial contraction)    December, 2013  . Pulmonary hypertension ([place])    moderate ECHO Jan 2008  . Scoliosis associated with other condition   . Sleep apnea   . Systolic heart failure   . TIA (transient ischemic attack)   . Tobacco abuse    quit   . Urinary retention    dx ~ 2-12, like from [place], now with a catheter, saw urology  . Urinary tract infection    hx of    Past Surgical History:  Procedure Laterality Date  . ABDOMINAL ANGIOGRAM  12/17/2011   Procedure: ABDOMINAL ANGIOGRAM;  Surgeon: Serafina Mitchell, MD;  Location: [facility] CATH LAB;  Service: Cardiovascular;;  . ABDOMINAL AORTAGRAM N/A 08/19/2013   Procedure:  ABDOMINAL Maxcine Ham;  Surgeon: Conrad [place], MD;  Location: [facility] CATH LAB;  Service: Cardiovascular;  Laterality: N/A;  . AMPUTATION  03/17/2012   Procedure: AMPUTATION ABOVE KNEE;  Surgeon: Conrad [place], MD;  Location: [place];  Service: Vascular;  Laterality: Left;  . AMPUTATION  06/03/2012   Procedure: AMPUTATION ABOVE KNEE;  Surgeon: Conrad [place], MD;  Location: [place];  Service: Vascular;  Laterality: Right;  . CHOLECYSTECTOMY N/A 10/25/2014   Procedure: LAPAROSCOPIC CHOLECYSTECTOMY ;  Surgeon: Coralie Keens, MD;  Location: [place];  Service: General;  Laterality: N/A;  . CORONARY ANGIOPLASTY WITH STENT PLACEMENT  05-01-2006   OCCLUDED CIRCUMFLEX -- TAXUS STENT PLACMENT  AND RESIDUAL 80% LAD,  50% RCA  . CYSTOSCOPY  07/30/2011   Procedure: CYSTOSCOPY;  Surgeon: Hanley Ben, MD;  Location[facility];  Service: Urology;  Laterality: N/A;  . ERCP N/A 08/25/2014   Procedure: ENDOSCOPIC RETROGRADE CHOLANGIOPANCREATOGRAPHY (ERCP);  Surgeon: Ladene Artist, MD;  [facility] ENDOSCOPY;  Service: Endoscopy;  Laterality: N/A;  . FEMORAL-POPLITEAL BYPASS GRAFT  03/11/2012   Procedure: BYPASS GRAFT FEMORAL-POPLITEAL ARTERY;  Surgeon: Conrad [place], MD;  Location: [place];  Service: Vascular;  Laterality: Left;  embolectomy left lower leg  . FEMORAL-TIBIAL BYPASS GRAFT  03/11/2012   Procedure: BYPASS GRAFT FEMORAL-TIBIAL ARTERY;  Surgeon: Conrad [place], MD;  Location: [facility] OR;  Service: Vascular;  Laterality: Left;  Left Femoral -Tibial trunk bypass, Endarterectomy of Tibial- Peroneal trunk with vein angioplasty.  Marland Kitchen HERNIA REPAIR  1990   (R)  . INTRAOPERATIVE ARTERIOGRAM  03/11/2012   Procedure: INTRA OPERATIVE ARTERIOGRAM;  Surgeon: Conrad [place], MD;  Location: [place];  Service: Vascular;  Laterality: Left;  . LOWER EXTREMITY ANGIOGRAM Bilateral 12/17/2011   Procedure: LOWER EXTREMITY ANGIOGRAM;  Surgeon: Serafina Mitchell, MD;  Location: [facility] CATH LAB;  Service: Cardiovascular;  Laterality: Bilateral;   bil lower extrem angio  . THORACENTESIS  2008   PLEURAL EFFUSION  . TRANSURETHRAL RESECTION OF PROSTATE  07/30/2011   Procedure: TRANSURETHRAL RESECTION OF THE PROSTATE (TURP);  Surgeon: Hanley Ben, MD;  Location[facility];  Service: Urology;  Laterality: N/A;    Social History   Socioeconomic History  . Marital  status: Married    Spouse name: Not on file  . Number of children: 2  . Years of education: Not on file  . Highest education level: Not on file  Occupational History  . Occupation: disable    Employer: RETIRED  Social Needs  . Financial resource strain: Not on file  . Food insecurity:    Worry: Not on file    Inability: Not on file  . Transportation needs:    Medical: Not on file    Non-medical: Not on file  Tobacco Use  . Smoking status: Former Smoker    Years: 50.00    Last attempt to quit: 07/22/2012    Years since quitting: 5.3  . Smokeless tobacco: Current User  . Tobacco comment: pt states that he is using E-cigs  Substance and Sexual Activity  . Alcohol use: No    Alcohol/week: 0.0 oz  . Drug use: No  . Sexual activity: Never    Comment: electronic cigarettes no nicotene  Lifestyle  . Physical activity:    Days per week: Not on file    Minutes per session: Not on file  . Stress: Not on file  Relationships  . Social connections:    Talks on phone: Not on file    Gets together: Not on file    Attends religious service: Not on file    Active member of club or organization: Not on file    Attends meetings of clubs or organizations: Not on file    Relationship status: Not on file  Other Topics Concern  . Not on file  Social History Narrative   Lives w/ wife    Family History  Problem Relation Age of Onset  . Heart attack Mother 66  . Heart disease Mother   . Stroke Mother   . Hyperlipidemia Mother   . Hypertension Mother   . COPD Father   . Peripheral vascular disease Father   . Diabetes Brother   . Heart disease Brother    . Hypertension Brother   . Heart attack Brother   . Diabetes Daughter   . Colon cancer Neg Hx   . Prostate cancer Neg Hx     Review of Systems  Constitutional: Positive for diaphoresis. Negative for chills and fever.  Respiratory: Positive for cough. Negative for shortness of breath and wheezing.   Cardiovascular: Negative for chest pain and palpitations.  Gastrointestinal: Negative for abdominal pain and nausea.  Genitourinary: Negative for hematuria.       Has permanent catheter  Neurological: Negative for light-headedness and headaches.       Objective:   Vitals:   11/25/17 1021  BP: 98/60  Pulse: 61  Resp: 16  Temp: 98.9 F (37.2 C)  SpO2: 92%   BP Readings from Last 3 Encounters:  11/25/17 98/60  11/01/17 (!) 151/99  07/18/17 130/68   Wt Readings from Last 3 Encounters:  11/01/17 144 lb 13.5 oz (65.7 kg)  10/29/16 160 lb (72.6 kg)  07/08/16 146 lb (66.2 kg)   There is no height or weight on file to calculate BMI.   Physical Exam    Constitutional: Appears well-developed and well-nourished. No distress.  HENT:  Head: Normocephalic and atraumatic.  Neck: Neck supple. No tracheal deviation present. No thyromegaly present.  No cervical lymphadenopathy Cardiovascular: Normal rate, regular rhythm and normal heart sounds.   No murmur heard. No carotid bruit .  No edema Pulmonary/Chest: Effort normal and breath sounds normal. No respiratory distress. No  has no wheezes. No rales.  Skin: Skin is warm and dry. Not diaphoretic.  Psychiatric: Normal mood and affect. Behavior is normal.      Assessment & Plan:    See Problem List for Assessment and Plan of chronic medical problems.

## 2017-11-25 ENCOUNTER — Other Ambulatory Visit (INDEPENDENT_AMBULATORY_CARE_PROVIDER_SITE_OTHER): Payer: Medicare Other

## 2017-11-25 ENCOUNTER — Ambulatory Visit (INDEPENDENT_AMBULATORY_CARE_PROVIDER_SITE_OTHER): Payer: Medicare Other | Admitting: Internal Medicine

## 2017-11-25 ENCOUNTER — Encounter: Payer: Self-pay | Admitting: Internal Medicine

## 2017-11-25 VITALS — BP 98/60 | HR 61 | Temp 98.9°F | Resp 16

## 2017-11-25 DIAGNOSIS — B961 Klebsiella pneumoniae [K. pneumoniae] as the cause of diseases classified elsewhere: Secondary | ICD-10-CM

## 2017-11-25 DIAGNOSIS — E1159 Type 2 diabetes mellitus with other circulatory complications: Secondary | ICD-10-CM

## 2017-11-25 DIAGNOSIS — B9689 Other specified bacterial agents as the cause of diseases classified elsewhere: Secondary | ICD-10-CM

## 2017-11-25 DIAGNOSIS — R7401 Elevation of levels of liver transaminase levels: Secondary | ICD-10-CM

## 2017-11-25 DIAGNOSIS — R6521 Severe sepsis with septic shock: Secondary | ICD-10-CM | POA: Diagnosis not present

## 2017-11-25 DIAGNOSIS — I10 Essential (primary) hypertension: Secondary | ICD-10-CM

## 2017-11-25 DIAGNOSIS — N390 Urinary tract infection, site not specified: Secondary | ICD-10-CM

## 2017-11-25 DIAGNOSIS — A419 Sepsis, unspecified organism: Secondary | ICD-10-CM | POA: Diagnosis not present

## 2017-11-25 DIAGNOSIS — E7849 Other hyperlipidemia: Secondary | ICD-10-CM | POA: Diagnosis not present

## 2017-11-25 DIAGNOSIS — Z794 Long term (current) use of insulin: Secondary | ICD-10-CM

## 2017-11-25 DIAGNOSIS — I2510 Atherosclerotic heart disease of native coronary artery without angina pectoris: Secondary | ICD-10-CM | POA: Diagnosis not present

## 2017-11-25 DIAGNOSIS — R740 Nonspecific elevation of levels of transaminase and lactic acid dehydrogenase [LDH]: Secondary | ICD-10-CM | POA: Diagnosis not present

## 2017-11-25 LAB — CBC WITH DIFFERENTIAL/PLATELET
BASOS ABS: 0 10*3/uL (ref 0.0–0.1)
Basophils Relative: 0.9 % (ref 0.0–3.0)
Eosinophils Absolute: 0.1 10*3/uL (ref 0.0–0.7)
Eosinophils Relative: 3.2 % (ref 0.0–5.0)
HCT: 40.3 % (ref 39.0–52.0)
Hemoglobin: 13 g/dL (ref 13.0–17.0)
LYMPHS ABS: 1.4 10*3/uL (ref 0.7–4.0)
Lymphocytes Relative: 30.8 % (ref 12.0–46.0)
MCHC: 32.3 g/dL (ref 30.0–36.0)
MCV: 80.7 fl (ref 78.0–100.0)
MONOS PCT: 8.6 % (ref 3.0–12.0)
Monocytes Absolute: 0.4 10*3/uL (ref 0.1–1.0)
NEUTROS ABS: 2.5 10*3/uL (ref 1.4–7.7)
NEUTROS PCT: 56.5 % (ref 43.0–77.0)
PLATELETS: 178 10*3/uL (ref 150.0–400.0)
RBC: 4.99 Mil/uL (ref 4.22–5.81)
RDW: 19 % — ABNORMAL HIGH (ref 11.5–15.5)
WBC: 4.4 10*3/uL (ref 4.0–10.5)

## 2017-11-25 LAB — HEMOGLOBIN A1C: Hgb A1c MFr Bld: 7 % — ABNORMAL HIGH (ref 4.6–6.5)

## 2017-11-25 LAB — COMPREHENSIVE METABOLIC PANEL
ALT: 11 U/L (ref 0–53)
AST: 17 U/L (ref 0–37)
Albumin: 3.8 g/dL (ref 3.5–5.2)
Alkaline Phosphatase: 61 U/L (ref 39–117)
BUN: 13 mg/dL (ref 6–23)
CALCIUM: 9.4 mg/dL (ref 8.4–10.5)
CHLORIDE: 107 meq/L (ref 96–112)
CO2: 27 meq/L (ref 19–32)
Creatinine, Ser: 0.58 mg/dL (ref 0.40–1.50)
GFR: 175.31 mL/min (ref >60.00)
Glucose, Bld: 99 mg/dL (ref 70–99)
Potassium: 3.8 mEq/L (ref 3.5–5.1)
Sodium: 142 mEq/L (ref 135–145)
Total Bilirubin: 0.8 mg/dL (ref 0.2–1.2)
Total Protein: 7.7 g/dL (ref 6.0–8.3)

## 2017-11-25 LAB — LIPID PANEL
CHOL/HDL RATIO: 3
Cholesterol: 110 mg/dL (ref 0–200)
HDL: 32.6 mg/dL — AB (ref >39.00)
LDL CALC: 64 mg/dL (ref 0–99)
NONHDL: 77.39
Triglycerides: 68 mg/dL (ref 0.0–149.0)
VLDL: 13.6 mg/dL (ref 0.0–40.0)

## 2017-11-25 LAB — TSH: TSH: 0.39 u[IU]/mL (ref 0.35–4.50)

## 2017-11-25 NOTE — Assessment & Plan Note (Signed)
Had elevated LFTs-likely related to his sepsis Recheck CMP today

## 2017-11-25 NOTE — Assessment & Plan Note (Signed)
Sugars have been controlled at home Continue current insulin regimen Will check A1c

## 2017-11-25 NOTE — Assessment & Plan Note (Signed)
Related to Klebsiella UTI Resolved with antibiotics and supportive measures

## 2017-11-25 NOTE — Assessment & Plan Note (Signed)
Continue statin We will check lipid panel, CMP, TSH

## 2017-11-25 NOTE — Assessment & Plan Note (Signed)
Blood pressure slightly on the low side here today, but overall has been controlled His wife will hold his blood pressure medication today and monitor closely at home CMP

## 2017-11-25 NOTE — Assessment & Plan Note (Addendum)
Infection cleared with antibiotics Has done well since the hospital Trying to drink more water Discussed that he does have a catheter and is at increased risk of infection If there are any concerns about a possible infection his wife will bring in a sample

## 2017-11-25 NOTE — Patient Instructions (Signed)
  Test(s) ordered today. Your results will be released to MyChart (or called to you) after review, usually within 72hours after test completion. If any changes need to be made, you will be notified at that same time.  Medications reviewed and updated.  No changes recommended at this time.    Please followup in 6 months   

## 2017-11-27 ENCOUNTER — Encounter: Payer: Self-pay | Admitting: Internal Medicine

## 2017-11-27 ENCOUNTER — Telehealth: Payer: Self-pay | Admitting: Cardiology

## 2017-11-27 DIAGNOSIS — I255 Ischemic cardiomyopathy: Secondary | ICD-10-CM | POA: Diagnosis not present

## 2017-11-27 DIAGNOSIS — I5022 Chronic systolic (congestive) heart failure: Secondary | ICD-10-CM | POA: Diagnosis not present

## 2017-11-27 DIAGNOSIS — G35 Multiple sclerosis: Secondary | ICD-10-CM | POA: Diagnosis not present

## 2017-11-27 DIAGNOSIS — I110 Hypertensive heart disease with heart failure: Secondary | ICD-10-CM | POA: Diagnosis not present

## 2017-11-27 DIAGNOSIS — E1151 Type 2 diabetes mellitus with diabetic peripheral angiopathy without gangrene: Secondary | ICD-10-CM | POA: Diagnosis not present

## 2017-11-27 DIAGNOSIS — I2510 Atherosclerotic heart disease of native coronary artery without angina pectoris: Secondary | ICD-10-CM | POA: Diagnosis not present

## 2017-11-27 NOTE — Telephone Encounter (Signed)
Spoke with Amy RN with [facility], and endorsed to her that Dr Meda Coffee agreed with the plan mentioned, and pt does have a hx of frequent PACs and PVCS.  Advised Amy to continue to monitor.  Amy RN verbalized understanding and agrees with this plan.

## 2017-11-27 NOTE — Telephone Encounter (Signed)
Home Health RN with [facility] calling to report to Dr Meda Coffee that she did the pts first home check today, and noted that his HR was slightly irregular, so she wanted to make sure it wasn't a new change.  Home Health RN states she did not do an EKG but listened to his heart and heard a skipped beat and was wondering if he had PACs or PVCs in the past.  Amy RN also wanted to endorse to Dr Meda Coffee that the pts PCP discontinued his Lisinopril due to hypotension.  Amy RN states that he also had labs done by his PCP on 11/25/17, and all labs came back within normal limits.  Amy RN states that the pt is completely ASYMPTOMATIC and has no complaints at all. Amy states that the pt isn't hydrating very well, and she educated him on today's home visit. Amy  reports his V/S at today's visit to be :110/60 and HR- 52-59.  Informed Amy that the pt does have a history of PACs and PVCs, noted in his chart, EKGs and problem list.  Advised Amy RN for now continue to monitor/follow the pt, encourage him to maintain good hydration and take all his meds as prescribed. Informed Amy that I will route this message to Dr Meda Coffee for further review and recommendation if needed, and I will follow-up with her thereafter, if any new changes are endorsed. Amy RN verbalized understanding and agrees with this plan.

## 2017-11-27 NOTE — Telephone Encounter (Signed)
He has h/p frequent PACs and PVCs.

## 2017-11-27 NOTE — Telephone Encounter (Signed)
New Message   Amy with [facility] called to report a irregular heartbeat between 52 and 55.   Patient c/o Palpitations:  High priority if patient c/o lightheadedness, shortness of breath, or chest pain  1) How long have you had palpitations/irregular HR/ Afib? Are you having the symptoms now? Irregular heartbeat, just today   2) Are you currently experiencing lightheadedness, SOB or CP? No symptoms  3) Do you have a history of afib (atrial fibrillation) or irregular heart rhythm? Yes 4) Have you checked your BP or HR? (document readings if available): BP 110/60  5) Are you experiencing any other symptoms? No  Also she added that the lisinopril has been on hold due to low BP.

## 2017-12-03 ENCOUNTER — Telehealth: Payer: Self-pay | Admitting: Internal Medicine

## 2017-12-03 NOTE — Telephone Encounter (Signed)
Copied from [place] [phone]. Topic: Quick Communication - See Telephone Encounter >> Dec 03, 2017  3:52 PM Aurelio Brash B wrote: CRM for notification. See Telephone encounter for: 12/03/17.  Amy  from [facility]  is  asking for order for social worker  to help with community resources   also   a bath aide,   and palliative care consult, her number is  [phone]     ok to leave voicemail

## 2017-12-03 NOTE — Telephone Encounter (Signed)
Agreed with all. Verbal order given by Lovena Le

## 2017-12-03 NOTE — Telephone Encounter (Signed)
Spoke with Amy giving verbal orders per MD

## 2017-12-05 DIAGNOSIS — E1151 Type 2 diabetes mellitus with diabetic peripheral angiopathy without gangrene: Secondary | ICD-10-CM | POA: Diagnosis not present

## 2017-12-05 DIAGNOSIS — I110 Hypertensive heart disease with heart failure: Secondary | ICD-10-CM | POA: Diagnosis not present

## 2017-12-05 DIAGNOSIS — I5022 Chronic systolic (congestive) heart failure: Secondary | ICD-10-CM | POA: Diagnosis not present

## 2017-12-05 DIAGNOSIS — I255 Ischemic cardiomyopathy: Secondary | ICD-10-CM | POA: Diagnosis not present

## 2017-12-05 DIAGNOSIS — I2510 Atherosclerotic heart disease of native coronary artery without angina pectoris: Secondary | ICD-10-CM | POA: Diagnosis not present

## 2017-12-05 DIAGNOSIS — G35 Multiple sclerosis: Secondary | ICD-10-CM | POA: Diagnosis not present

## 2017-12-08 ENCOUNTER — Encounter: Payer: Self-pay | Admitting: Neurology

## 2017-12-08 ENCOUNTER — Ambulatory Visit (INDEPENDENT_AMBULATORY_CARE_PROVIDER_SITE_OTHER): Payer: Medicare Other | Admitting: Neurology

## 2017-12-08 VITALS — BP 112/62 | HR 62 | Ht 71.0 in | Wt 140.0 lb

## 2017-12-08 DIAGNOSIS — I2510 Atherosclerotic heart disease of native coronary artery without angina pectoris: Secondary | ICD-10-CM | POA: Diagnosis not present

## 2017-12-08 DIAGNOSIS — G35 Multiple sclerosis: Secondary | ICD-10-CM

## 2017-12-08 DIAGNOSIS — G546 Phantom limb syndrome with pain: Secondary | ICD-10-CM | POA: Diagnosis not present

## 2017-12-08 NOTE — Progress Notes (Signed)
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  He is accompanied by his wife who provides some history.  Records reviewed.   UPDATE: For pain, he takes Lyrica 150mg  in AM and 300mg  at night.  He also takes baclofen 5mg  twice daily.  He takes tramadol 50mg  2 to 3 times daily.  The phantom limb pain seems to be getting worse, particularly at night.  He was admitted to the hospital in April with metabolic encephalopathy due to urosepsis.  Lyrica dose was reduced at that time.  After he recovered, the dose was increased again.  He is doing well now.  No new issues.   HISTORY: He was diagnosed with MS in 1993.  Initially, he developed right arm and leg weakness along with balance problems.  Symptoms initially improved but had progressed over the years.  He was on multiple immunomodulating agents such as Betaseron, Copaxone, Cytoxin and monthly Solumedrol.  He currently is not on an agent and hasn't had a flare up in 4 years.  He has bilateral ATK ambutations performed in 2013 as a consequence of his peripheral vascular and diabetes.  He requires assistance with bathing and dressing, but he is able to feed himself.  He uses a power wheelchair.  He had been on amantadine for many years.   MRI Brain w/wo from 11/29/04 demonstrated "extensive white matter disease in the supratentorial compartment, which is consistent with advanced multiple sclerosis.  There is confluence of the white matter lesions with atrophy and hydrocephalus, thinned corpus callosum, and black holes, however somewhat unusual for MS of this degree, no infrantentorial lesions are seen and no active lesions are identified by means of contrast." CT of head was performed on 03/23/12  for encephalopathy, which revealed atrophy and nonspecific small vessel ischemic changes.   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  He has some poor short-term memory. Sometimes he calls his wife by his mother's name.  He cannot always remember the names of his great grandchildren.  He has more difficulty figuring out how to maneuver the motorized wheelchair.   Prior medication:  Gabapentin was ineffective.  Amantadine was discontinued due to nightmares.   He has [place].  He is unable to sit due to pain and often is laying in bed.  He is frequently turned to avoid pressure ulcers.  PAST MEDICAL HISTORY: Past Medical History:  Diagnosis Date  . Atherosclerotic PVD with ulceration ([place])    left foot  . CAD (coronary artery disease) CARDIOLOGIST- DR KATZ-- VISIT 06-05-2011 IN [facility]   non-STEMI, 2007.Marland Kitchenoccluded circumflex.. Taxus stent placed...residual 80% LAD...50% RCA  . Cardiomyopathy, ischemic    EF 45% per ECHO 2008  //   EF 25%, echo, August, 2013 //  Echo (8/15):  Mild LVH, EF 30-35%, ant-lat and lat AK, inf HK, Gr 1 DD, mild MR, mild LAE  . Carotid artery disease ([place])    a.  Doppler, February, 2012, 0-39% bilateral,Mild smooth plaque;  b.  Carotid US (8/15):  Bilateral 1-39% ICA >>> F/u 2 years  . Chronic indwelling Foley catheter   . Constipation   . Dementia   . Diabetes mellitus    INSULIN-DEPENDent  . Fatigue SEVERE  . H/O pleural effusion 2008  POST THORACENTESIS  . History of colon polyps PRECANCEROUS  . Hyperlipidemia   . Hypertension   . Impotence   . Increased prostate specific antigen (PSA) velocity   . Insomnia   . Lung nodule    resolved 11-2006 CT Chest  . Multiple sclerosis ([place]) [place] -- LAST VISIT 11-20-2010  NOTE W/ CHART  . PAC (premature atrial contraction)    December, 2013  . Pulmonary hypertension ([place])    moderate ECHO Jan 2008  . Scoliosis associated with other condition   . Sleep apnea   .  Systolic heart failure   . TIA (transient ischemic attack)   . Tobacco abuse    quit   . Urinary retention    dx ~ 2-12, like from [place], now with a catheter, saw urology  . Urinary tract infection    hx of    MEDICATIONS: Current Outpatient Medications on File Prior to Visit  Medication Sig Dispense Refill  . albuterol (PROVENTIL HFA;VENTOLIN HFA) 108 (90 Base) MCG/ACT inhaler Inhale 2 puffs into the lungs every 6 (six) hours as needed for wheezing or shortness of breath. 1 Inhaler 2  . aspirin EC 81 MG tablet Take 1 tablet (81 mg total) by mouth daily. 90 tablet 3  . azelastine (ASTELIN) 0.1 % nasal spray Place 2 sprays into both nostrils at bedtime as needed for rhinitis. Use in each nostril as directed 30 mL 3  . Cholecalciferol (VITAMIN D) 2000 units CAPS Take 1 capsule by mouth daily.     . diclofenac sodium (VOLTAREN) 1 % GEL Apply 1 application topically 2 (two) times daily as needed (pain). For pain    . furosemide (LASIX) 20 MG tablet Take 1 tablet (20 mg total) by mouth daily as needed for fluid or edema (or shortness of breath). 30 tablet 0  . glucagon (GLUCAGEN) 1 MG SOLR injection Inject 1 mg into the vein once as needed for low blood sugar. Reported on 11/16/2015    . insulin aspart protamine - aspart (NOVOLOG 70/30 MIX) (70-30) 100 UNIT/ML FlexPen Inject 0.05 mLs (5 Units total) into the skin 2 (two) times daily. 15 mL 5  . lisinopril (PRINIVIL,ZESTRIL) 10 MG tablet TAKE 1 TABLET BY MOUTH EVERY DAY (Patient not taking: Reported on 12/08/2017) 30 tablet 5  . Multiple Vitamins-Minerals (MULTIVITAMIN WITH MINERALS) tablet Take 1 tablet by mouth daily.    . nicotine (NICODERM CQ - DOSED IN MG/24 HOURS) 14 mg/24hr patch Place 1 patch (14 mg total) onto the skin daily. 28 patch 0  . pantoprazole (PROTONIX) 40 MG tablet Take 1 tablet (40 mg total) by mouth daily with lunch. 90 tablet 1  . pregabalin (LYRICA) 25 MG capsule Take 1 capsule (25 mg total) by mouth daily. 30 capsule 0  .  rosuvastatin (CRESTOR) 20 MG tablet Take 1 tablet (20 mg total) by mouth daily. 30 tablet 3  . traMADol (ULTRAM) 50 MG tablet Take 1 tablet (50 mg total) by mouth every 4 (four) hours as needed. 120 tablet 5   No current facility-administered medications on file prior to visit.     ALLERGIES: Allergies  Allergen Reactions  . Bee Venom Anaphylaxis  . Beta Adrenergic Blockers Other (See Comments)    Bradycardia with carvedilol  . Carvedilol Other (See Comments)    Bradycardia with all Beta blockers   . Influenza Vaccines Other (See Comments)    Sick for months  . Atorvastatin     Severe  pain in stumps    FAMILY HISTORY: Family History  Problem Relation Age of Onset  . Heart attack Mother 82  . Heart disease Mother   . Stroke Mother   . Hyperlipidemia Mother   . Hypertension Mother   . COPD Father   . Peripheral vascular disease Father   . Diabetes Brother   . Heart disease Brother   . Hypertension Brother   . Heart attack Brother   . Diabetes Daughter   . Colon cancer Neg Hx   . Prostate cancer Neg Hx     SOCIAL HISTORY: Social History   Socioeconomic History  . Marital status: Married    Spouse name: Not on file  . Number of children: 2  . Years of education: Not on file  . Highest education level: Not on file  Occupational History  . Occupation: disable    Employer: RETIRED  Social Needs  . Financial resource strain: Not on file  . Food insecurity:    Worry: Not on file    Inability: Not on file  . Transportation needs:    Medical: Not on file    Non-medical: Not on file  Tobacco Use  . Smoking status: Former Smoker    Years: 50.00    Last attempt to quit: 07/22/2012    Years since quitting: 5.3  . Smokeless tobacco: Current User  . Tobacco comment: pt states that he is using E-cigs  Substance and Sexual Activity  . Alcohol use: No    Alcohol/week: 0.0 oz  . Drug use: No  . Sexual activity: Never    Comment: electronic cigarettes no nicotene    Lifestyle  . Physical activity:    Days per week: Not on file    Minutes per session: Not on file  . Stress: Not on file  Relationships  . Social connections:    Talks on phone: Not on file    Gets together: Not on file    Attends religious service: Not on file    Active member of club or organization: Not on file    Attends meetings of clubs or organizations: Not on file    Relationship status: Not on file  . Intimate partner violence:    Fear of current or ex partner: Not on file    Emotionally abused: Not on file    Physically abused: Not on file    Forced sexual activity: Not on file  Other Topics Concern  . Not on file  Social History Narrative   Lives w/ wife    REVIEW OF SYSTEMS: Constitutional: No fevers, chills, or sweats, no generalized fatigue, change in appetite Eyes: No visual changes, double vision, eye pain Ear, nose and throat: No hearing loss, ear pain, nasal congestion, sore throat Cardiovascular: No chest pain, palpitations Respiratory:  No shortness of breath at rest or with exertion, wheezes GastrointestinaI: No nausea, vomiting, diarrhea, abdominal pain, fecal incontinence Genitourinary:  No dysuria, urinary retention or frequency Musculoskeletal:  Back pain Integumentary: No rash, pruritus, skin lesions Neurological: as above Psychiatric: No depression, insomnia, anxiety Endocrine: No palpitations, fatigue, diaphoresis, mood swings, change in appetite, change in weight, increased thirst Hematologic/Lymphatic:  No purpura, petechiae. Allergic/Immunologic: no itchy/runny eyes, nasal congestion, recent allergic reactions, rashes  PHYSICAL EXAM: Vitals:   12/08/17 0952  BP: 112/62  Pulse: 62  SpO2: 94%   General: No acute distress.  Patient appears well-groomed.   Head:  Normocephalic/atraumatic Eyes:  Fundi examined but not visualized Neck: supple, no  paraspinal tenderness, full range of motion Heart:  Regular rate and rhythm Lungs:  Clear to  auscultation bilaterally Back: No paraspinal tenderness Neurological Exam: alert and oriented to person, place, and time. Attention span and concentration intact, recent and remote memory intact, fund of knowledge intact.  Speech fluent and not dysarthric, language intact.  CN II-XII intact. Fundi not able to be visualized on inspection.  Mildly inncreased tone in upper extremities, 5-/5 right triceps, otherwise muscle strength 5/5 in upper extremities.  Sensation to light touch intact.  Deep tendon reflexes 2+ in upper extremities.  Finger to nose intact.  IMPRESSION: Secondary progressive MS, clinically stable Phantom limb pain  PLAN: 1.  Continue Lyrica 150mg /300mg , baclofen 5mg  and tramadol as needed 2.  Follow up in one year  18 minutes spent face to face with patient, over 50% spent discussing management.  Metta Clines, DO  CC:  [name]

## 2017-12-08 NOTE — Patient Instructions (Signed)
1.  Continue Lyrica 150mg  in AM and 300mg  at night, baclofen and tramadol 2.  Follow up in one year or as needed.

## 2017-12-09 DIAGNOSIS — I5022 Chronic systolic (congestive) heart failure: Secondary | ICD-10-CM | POA: Diagnosis not present

## 2017-12-09 DIAGNOSIS — I110 Hypertensive heart disease with heart failure: Secondary | ICD-10-CM | POA: Diagnosis not present

## 2017-12-09 DIAGNOSIS — E1151 Type 2 diabetes mellitus with diabetic peripheral angiopathy without gangrene: Secondary | ICD-10-CM | POA: Diagnosis not present

## 2017-12-09 DIAGNOSIS — I255 Ischemic cardiomyopathy: Secondary | ICD-10-CM | POA: Diagnosis not present

## 2017-12-09 DIAGNOSIS — G35 Multiple sclerosis: Secondary | ICD-10-CM | POA: Diagnosis not present

## 2017-12-09 DIAGNOSIS — I2510 Atherosclerotic heart disease of native coronary artery without angina pectoris: Secondary | ICD-10-CM | POA: Diagnosis not present

## 2017-12-16 ENCOUNTER — Telehealth: Payer: Self-pay

## 2017-12-16 NOTE — Telephone Encounter (Signed)
Phone call placed to patient to offer to schedule a visit with Palliative Care. VM left 

## 2017-12-17 ENCOUNTER — Telehealth: Payer: Self-pay

## 2017-12-17 DIAGNOSIS — I255 Ischemic cardiomyopathy: Secondary | ICD-10-CM | POA: Diagnosis not present

## 2017-12-17 DIAGNOSIS — I5022 Chronic systolic (congestive) heart failure: Secondary | ICD-10-CM | POA: Diagnosis not present

## 2017-12-17 DIAGNOSIS — G35 Multiple sclerosis: Secondary | ICD-10-CM | POA: Diagnosis not present

## 2017-12-17 DIAGNOSIS — E1151 Type 2 diabetes mellitus with diabetic peripheral angiopathy without gangrene: Secondary | ICD-10-CM | POA: Diagnosis not present

## 2017-12-17 DIAGNOSIS — I110 Hypertensive heart disease with heart failure: Secondary | ICD-10-CM | POA: Diagnosis not present

## 2017-12-17 DIAGNOSIS — I2510 Atherosclerotic heart disease of native coronary artery without angina pectoris: Secondary | ICD-10-CM | POA: Diagnosis not present

## 2017-12-17 NOTE — Telephone Encounter (Signed)
Received return VM from patient's wife. Returned call to patient's wife to schedule visit with Palliative Care NP. Scheduled for Thursday 12/18/17

## 2017-12-18 ENCOUNTER — Other Ambulatory Visit: Payer: Medicare Other | Admitting: Internal Medicine

## 2017-12-18 DIAGNOSIS — Z7189 Other specified counseling: Secondary | ICD-10-CM

## 2017-12-18 DIAGNOSIS — Z7409 Other reduced mobility: Secondary | ICD-10-CM

## 2017-12-18 DIAGNOSIS — Z515 Encounter for palliative care: Secondary | ICD-10-CM

## 2017-12-18 DIAGNOSIS — G546 Phantom limb syndrome with pain: Secondary | ICD-10-CM

## 2017-12-19 ENCOUNTER — Encounter: Payer: Self-pay | Admitting: Internal Medicine

## 2017-12-19 NOTE — Progress Notes (Signed)
12/18/2017  PALLIATIVE CARE CONSULT S[place], Granillo DOB: February 27, 1941. [address] [place] [address]. (H) [phone], [phone]  REFERRING PROVIDER:  [facility] Meriam Sprague LCSW)/ Dr Billey Gosling [facility], ([phone] Being followed by [place] (for 2 more visits). LCSW Jenny Reichmann helped assist with tapping into [place] benefits.  FAMILY: (spouse) Leonell Lobdell [facility], ([phone]. *(dtr) Zack Seal [phone] (lives in home), [place].  BILLABLE  ICD-10: IMPRESSION/RECOMMENDATIONS: 1.Mobility impaired (Z74.09) -OOB into chair hampered by scoliosis, configuration such that when sitting up (even in reclining type wheelchair) that his weight centers on his coccyx, and causes severe pain. Also, his baseline mild cognitive decline coupled with pain challenges his ability to operate his motorized wheelchair, so that he bangs his upper extremities into door jams/other objects and causes UE injuries. Because of this, patient wishes to avoid even short trips to a doctor's office. He is planning to establish with Dr. Clovia Cuff for home provider visits (patient has discussed this with his PCP Dr. Billey Gosling). -Seen by PT recent hospital adm; patient doesn't want to do home PT   2. Phantom pain r/t bilateral AKA: - Daily phantom pain managed with prn Voltaren gel, massage, distraction, and repositioning.  Has resourced Pain Management Clinic in past; felt no real impact. Lyrica helps somewhat .  Tramadol relieves within 10 min; takes about 1-2/d.  3. Care Management: - Patient planning to establish with Dr.Asenso . - Being followed by [place] (for 2 more visits). Their LCSW Meriam Sprague  assisting with exploring available [employer] benefits (22.5 years served in [employer]). Spouse [name] plans f/u TC to [place] later today. -Family friend, spouse, and daughter help out with bathing/turning.   3. Advanced Care Planning:   -Discussed DNR; patient/spouse mention patient has been successfully resuscitated by single defibrillation attempt in the past, and would like the same chance in the future.   4. Follow up: -Patient/wife have Palliative Care contact information; they will call us on an as needed basis.  I spent 75 minutes providing this consultation (from  1pm to 2:15pm). More than 50% of that time was spent coordinating communication.  HPI: 77 yo male, with medical history significant for bilateral AKA, MS, and mild cognitive impairment.  Recent hospitalization for UTI related sepsis. Referred to Palliative Care for assistance with symptom management, help with coordination of resources, and ongoing discussions regarding goals of care/advanced directives  CODE STATUS: Full Code PPS: 30 % HOSPICE ELIGIBILITY: no; chronic medical condition with long term prognosis greater than 6 months  MEDICATIONS :  Aspirin 81mg  qd, MTV, D3-2000 d, furosemide 20mg  qd, Lisinopril  10mg  qd, lyrica (pregabalin) 25mg  qd, nicotine patch 14mg , Novolog 70-30 5 units bid, pantoprazole 40mg  qd, rosuvasatin 20mg  qd, tramadol 50mg  q4h prn (only takes 1-2 tabs/wk), Voltaren  (stumps for phantom pain) prn. 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 NABS EXTREMITIES:  bilateral AKA MUSCULOSKELETAL:  no contractures SKIN:  warm, dry, and intact NEURO:  A & O X 3  Mary Serpe NP-C

## 2017-12-22 ENCOUNTER — Other Ambulatory Visit: Payer: Self-pay | Admitting: Emergency Medicine

## 2017-12-22 DIAGNOSIS — I10 Essential (primary) hypertension: Secondary | ICD-10-CM | POA: Diagnosis not present

## 2017-12-22 DIAGNOSIS — Z0000 Encounter for general adult medical examination without abnormal findings: Secondary | ICD-10-CM | POA: Diagnosis not present

## 2017-12-22 DIAGNOSIS — E1165 Type 2 diabetes mellitus with hyperglycemia: Secondary | ICD-10-CM | POA: Diagnosis not present

## 2017-12-22 DIAGNOSIS — G894 Chronic pain syndrome: Secondary | ICD-10-CM | POA: Diagnosis not present

## 2017-12-22 DIAGNOSIS — I509 Heart failure, unspecified: Secondary | ICD-10-CM | POA: Diagnosis not present

## 2017-12-22 MED ORDER — PANTOPRAZOLE SODIUM 40 MG PO TBEC: 40.0000 mg | DELAYED_RELEASE_TABLET | Freq: Every day | ORAL | 1 refills | Status: AC

## 2018-01-01 DIAGNOSIS — I2510 Atherosclerotic heart disease of native coronary artery without angina pectoris: Secondary | ICD-10-CM | POA: Diagnosis not present

## 2018-01-01 DIAGNOSIS — G35 Multiple sclerosis: Secondary | ICD-10-CM | POA: Diagnosis not present

## 2018-01-01 DIAGNOSIS — I110 Hypertensive heart disease with heart failure: Secondary | ICD-10-CM | POA: Diagnosis not present

## 2018-01-01 DIAGNOSIS — E1151 Type 2 diabetes mellitus with diabetic peripheral angiopathy without gangrene: Secondary | ICD-10-CM | POA: Diagnosis not present

## 2018-01-01 DIAGNOSIS — I255 Ischemic cardiomyopathy: Secondary | ICD-10-CM | POA: Diagnosis not present

## 2018-01-01 DIAGNOSIS — I5022 Chronic systolic (congestive) heart failure: Secondary | ICD-10-CM | POA: Diagnosis not present

## 2018-01-09 DIAGNOSIS — G35 Multiple sclerosis: Secondary | ICD-10-CM | POA: Diagnosis not present

## 2018-01-09 DIAGNOSIS — I509 Heart failure, unspecified: Secondary | ICD-10-CM | POA: Diagnosis not present

## 2018-01-15 ENCOUNTER — Encounter: Payer: Self-pay | Admitting: Cardiology

## 2018-01-18 NOTE — Progress Notes (Signed)
Subjective:    Patient ID: Jaime [name], male    DOB: 1940/11/09, 77 y.o.   MRN: [id number]  HPI The patient is here for follow up.  He needs have paperwork filled out for [place] assistance.  He is currently living at home with his wife.  His wife and his daughter care for him.  He has multiple medical problems including Multiple sclerosis with spastic paraplegia, hemiparesis, s/p B/L AKA's, diabetes, mild cognitive impairment, CAD, PAD/PVD, chronic systolic heart  and ischemic cardiomyopathy.  He also has scoliosis and has a result of many factors poor muscle strength in his core and back and therefore his posture is poor.  He needs full assist for transfers and for the most part stays in bed all day and all night except for doctor visits.  He is not able to roll himself over in bed.  He is able to feed himself except for when he becomes very fatigued and that his right hand and arm become weaker and his wife will have to feed him.  He is able to use an electric shaver by himself.  He has good bilateral grip strength, but his right hand is weaker than his left.  He is not able to define movements or button his clothing.  He is status post bilateral above-knee amputations.  It is becoming extremely difficult for his wife and his daughter to care for him at home and he needs assistance and most likely placement.  His wife cooks for him and gives him all of his medications.  She also provides most of the history because of his mild cognitive impairment.  They deny any changes in his history since he was here last and have no concerns.  He does have a power wheelchair which he is able to operate on his own.  Medications and allergies reviewed with patient and updated if appropriate.  Patient Active Problem List   Diagnosis Date Noted  . UTI due to Klebsiella species 11/24/2017  . Hypoglycemia   . Cool skin 07/18/2017  . Hematuria 04/29/2017  . GERD (gastroesophageal reflux disease)  04/10/2016  . Coronary artery disease involving native coronary artery of native heart without angina pectoris 12/28/2015  . PAD (peripheral artery disease) ([place]) 12/28/2015  . Bradycardia 08/14/2015  . Chronic systolic heart failure ([place]) 08/14/2015  . Phantom limb pain ([place]) 12/08/2014  . Syncope 11/01/2014  . Multiple sclerosis, secondary progressive ([place]) 06/10/2014  . Paraparesis of both lower limbs ([place]) 05/31/2014  . Scoliosis, thoracogenic, acquired 03/15/2014  . Mild cognitive impairment 02/11/2014  . PVD (peripheral vascular disease) ([place]) 08/13/2013  . S/P AKA (above knee amputation) bilateral ([place]) 08/03/2012  . PAC (premature atrial contraction)   . Peripheral vascular disease, unspecified ([place]) 07/03/2012  . 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 Ejection fraction   . Cardiomyopathy, ischemic   . Pulmonary hypertension ([place])   . Hemiparesis ([place])   . Essential hypertension   . Carotid artery disease ([place])   . Hyperlipidemia 07/31/2007  . BPH (benign prostatic hyperplasia) 04/15/2007  . DMII (diabetes mellitus, type 2) ([place]) 12/18/2006    Current Outpatient Medications on File Prior to Visit  Medication Sig Dispense Refill  . albuterol (PROVENTIL HFA;VENTOLIN HFA) 108 (90 Base) MCG/ACT inhaler Inhale 2 puffs into the lungs every 6 (six) hours as needed for wheezing or shortness of breath. 1 Inhaler 2  . aspirin EC 81 MG tablet Take 1 tablet (81 mg total) by mouth  daily. 90 tablet 3  . azelastine (ASTELIN) 0.1 % nasal spray Place 2 sprays into both nostrils at bedtime as needed for rhinitis. Use in each nostril as directed 30 mL 3  . Cholecalciferol (VITAMIN D) 2000 units CAPS Take 1 capsule by mouth daily.     . diclofenac sodium (VOLTAREN) 1 % GEL Apply 1 application topically 2 (two) times daily as needed (pain). For pain    . furosemide (LASIX) 20 MG tablet Take 1 tablet (20 mg total) by mouth daily as needed for fluid or edema (or  shortness of breath). 30 tablet 0  . glucagon (GLUCAGEN) 1 MG SOLR injection Inject 1 mg into the vein once as needed for low blood sugar. Reported on 11/16/2015    . insulin aspart protamine - aspart (NOVOLOG 70/30 MIX) (70-30) 100 UNIT/ML FlexPen Inject 0.05 mLs (5 Units total) into the skin 2 (two) times daily. 15 mL 5  . lisinopril (PRINIVIL,ZESTRIL) 10 MG tablet TAKE 1 TABLET BY MOUTH EVERY DAY 30 tablet 5  . Multiple Vitamins-Minerals (MULTIVITAMIN WITH MINERALS) tablet Take 1 tablet by mouth daily.    . nicotine (NICODERM CQ - DOSED IN MG/24 HOURS) 14 mg/24hr patch Place 1 patch (14 mg total) onto the skin daily. 28 patch 0  . pantoprazole (PROTONIX) 40 MG tablet Take 1 tablet (40 mg total) by mouth daily with lunch. 90 tablet 1  . pregabalin (LYRICA) 25 MG capsule Take 1 capsule (25 mg total) by mouth daily. 30 capsule 0  . rosuvastatin (CRESTOR) 20 MG tablet Take 1 tablet (20 mg total) by mouth daily. 30 tablet 3  . traMADol (ULTRAM) 50 MG tablet Take 1 tablet (50 mg total) by mouth every 4 (four) hours as needed. 120 tablet 5   No current facility-administered medications on file prior to visit.     Past Medical History:  Diagnosis Date  . Atherosclerotic PVD with ulceration ([place])    left foot  . CAD (coronary artery disease) CARDIOLOGIST- DR KATZ-- VISIT 06-05-2011 IN [facility]   non-STEMI, 2007.Marland Kitchenoccluded circumflex.. Taxus stent placed...residual 80% LAD...50% RCA  . Cardiomyopathy, ischemic    EF 45% per ECHO 2008  //   EF 25%, echo, August, 2013 //  Echo (8/15):  Mild LVH, EF 30-35%, ant-lat and lat AK, inf HK, Gr 1 DD, mild MR, mild LAE  . Carotid artery disease ([place])    a.  Doppler, February, 2012, 0-39% bilateral,Mild smooth plaque;  b.  Carotid US (8/15):  Bilateral 1-39% ICA >>> F/u 2 years  . Chronic indwelling Foley catheter   . Constipation   . Dementia   . Diabetes mellitus    INSULIN-DEPENDent  . Fatigue SEVERE  . H/O pleural effusion 2008   POST THORACENTESIS  .  History of colon polyps PRECANCEROUS  . Hyperlipidemia   . Hypertension   . Impotence   . Increased prostate specific antigen (PSA) velocity   . Insomnia   . Lung nodule    resolved 11-2006 CT Chest  . Multiple sclerosis ([place]) [place] -- LAST VISIT 11-20-2010  NOTE W/ CHART  . PAC (premature atrial contraction)    December, 2013  . Pulmonary hypertension ([place])    moderate ECHO Jan 2008  . Scoliosis associated with other condition   . Sleep apnea   . Systolic heart failure   . TIA (transient ischemic attack)   . Tobacco abuse    quit   . Urinary retention  dx ~ 2-12, like from [place], now with a catheter, saw urology  . Urinary tract infection    hx of    Past Surgical History:  Procedure Laterality Date  . ABDOMINAL ANGIOGRAM  12/17/2011   Procedure: ABDOMINAL ANGIOGRAM;  Surgeon: Serafina Mitchell, MD;  Location: [facility] CATH LAB;  Service: Cardiovascular;;  . ABDOMINAL AORTAGRAM N/A 08/19/2013   Procedure: ABDOMINAL Maxcine Ham;  Surgeon: Conrad Gibsland, MD;  Location: [facility] CATH LAB;  Service: Cardiovascular;  Laterality: N/A;  . AMPUTATION  03/17/2012   Procedure: AMPUTATION ABOVE KNEE;  Surgeon: Conrad Wheeler, MD;  Location: [place];  Service: Vascular;  Laterality: Left;  . AMPUTATION  06/03/2012   Procedure: AMPUTATION ABOVE KNEE;  Surgeon: Conrad [place], MD;  Location: [place];  Service: Vascular;  Laterality: Right;  . CHOLECYSTECTOMY N/A 10/25/2014   Procedure: LAPAROSCOPIC CHOLECYSTECTOMY ;  Surgeon: Coralie Keens, MD;  Location: [place];  Service: General;  Laterality: N/A;  . CORONARY ANGIOPLASTY WITH STENT PLACEMENT  05-01-2006   OCCLUDED CIRCUMFLEX -- TAXUS STENT PLACMENT  AND RESIDUAL 80% LAD,  50% RCA  . CYSTOSCOPY  07/30/2011   Procedure: CYSTOSCOPY;  Surgeon: Hanley Ben, MD;  Location: [facility];  Service: Urology;  Laterality: N/A;  . ERCP N/A 08/25/2014   Procedure: ENDOSCOPIC RETROGRADE CHOLANGIOPANCREATOGRAPHY (ERCP);  Surgeon:  Ladene Artist, MD;  Location[facility] ENDOSCOPY;  Service: Endoscopy;  Laterality: N/A;  . FEMORAL-POPLITEAL BYPASS GRAFT  03/11/2012   Procedure: BYPASS GRAFT FEMORAL-POPLITEAL ARTERY;  Surgeon: Conrad [place], MD;  Location: [place];  Service: Vascular;  Laterality: Left;  embolectomy left lower leg  . FEMORAL-TIBIAL BYPASS GRAFT  03/11/2012   Procedure: BYPASS GRAFT FEMORAL-TIBIAL ARTERY;  Surgeon: Conrad [place], MD;  Location: [facility] OR;  Service: Vascular;  Laterality: Left;  Left Femoral -Tibial trunk bypass, Endarterectomy of Tibial- Peroneal trunk with vein angioplasty.  Marland Kitchen HERNIA REPAIR  1990   (R)  . INTRAOPERATIVE ARTERIOGRAM  03/11/2012   Procedure: INTRA OPERATIVE ARTERIOGRAM;  Surgeon: Conrad [place], MD;  Location: [place];  Service: Vascular;  Laterality: Left;  . LOWER EXTREMITY ANGIOGRAM Bilateral 12/17/2011   Procedure: LOWER EXTREMITY ANGIOGRAM;  Surgeon: Serafina Mitchell, MD;  Location: [facility] CATH LAB;  Service: Cardiovascular;  Laterality: Bilateral;  bil lower extrem angio  . THORACENTESIS  2008   PLEURAL EFFUSION  . TRANSURETHRAL RESECTION OF PROSTATE  07/30/2011   Procedure: TRANSURETHRAL RESECTION OF THE PROSTATE (TURP);  Surgeon: Hanley Ben, MD;  Location: [facility];  Service: Urology;  Laterality: N/A;    Social History   Socioeconomic History  . Marital status: Married    Spouse name: Not on file  . Number of children: 2  . Years of education: Not on file  . Highest education level: Not on file  Occupational History  . Occupation: disable    Employer: RETIRED  Social Needs  . Financial resource strain: Not on file  . Food insecurity:    Worry: Not on file    Inability: Not on file  . Transportation needs:    Medical: Not on file    Non-medical: Not on file  Tobacco Use  . Smoking status: Former Smoker    Years: 50.00    Last attempt to quit: 07/22/2012    Years since quitting: 5.4  . Smokeless tobacco: Current User  . Tobacco comment: pt states that he  is using E-cigs  Substance and Sexual Activity  . Alcohol use: No  Alcohol/week: 0.0 oz  . Drug use: No  . Sexual activity: Never    Comment: electronic cigarettes no nicotene  Lifestyle  . Physical activity:    Days per week: Not on file    Minutes per session: Not on file  . Stress: Not on file  Relationships  . Social connections:    Talks on phone: Not on file    Gets together: Not on file    Attends religious service: Not on file    Active member of club or organization: Not on file    Attends meetings of clubs or organizations: Not on file    Relationship status: Not on file  Other Topics Concern  . Not on file  Social History Narrative   Lives w/ wife    Family History  Problem Relation Age of Onset  . Heart attack Mother 63  . Heart disease Mother   . Stroke Mother   . Hyperlipidemia Mother   . Hypertension Mother   . COPD Father   . Peripheral vascular disease Father   . Diabetes Brother   . Heart disease Brother   . Hypertension Brother   . Heart attack Brother   . Diabetes Daughter   . Colon cancer Neg Hx   . Prostate cancer Neg Hx     Review of Systems  Constitutional: Negative for appetite change and fever.  Respiratory: Negative for cough, shortness of breath and wheezing.   Cardiovascular: Negative for chest pain and palpitations.  Gastrointestinal: Negative for abdominal pain.  Neurological: Negative for light-headedness and headaches.       Objective:   Vitals:   01/19/18 1025  BP: (!) 110/58  Pulse: (!) 52  Resp: 16  Temp: 98 F (36.7 C)  SpO2: 97%   BP Readings from Last 3 Encounters:  01/19/18 (!) 110/58  12/19/17 102/64  12/08/17 112/62   Wt Readings from Last 3 Encounters:  01/19/18 140 lb (63.5 kg)  12/08/17 140 lb (63.5 kg)  11/01/17 144 lb 13.5 oz (65.7 kg)   Body mass index is 19.53 kg/m.   Physical Exam  Constitutional: He is oriented to person, place, and time. He appears well-developed and well-nourished. No  distress.  HENT:  Head: Normocephalic and atraumatic.  Eyes: Conjunctivae are normal.  Neck: No tracheal deviation present. No thyromegaly present.  Cardiovascular: Normal rate and regular rhythm.  Pulmonary/Chest: Effort normal and breath sounds normal. No respiratory distress. He has no wheezes. He has no rales.  Abdominal: Soft. He exhibits no distension. There is no tenderness.  Musculoskeletal: He exhibits no edema.  Poor posture secondary to scoliosisAnd poor muscular strength, slightly decreased range of motion of bilateral upper extremities, normal range of motion of neck  Lymphadenopathy:    He has no cervical adenopathy.  Neurological: He is alert and oriented to person, place, and time. No cranial nerve deficit or sensory deficit.  Skin: Skin is warm and dry. He is not diaphoretic.         Assessment & Plan:    Paperwork [place] for [place] assistance.  Above medical problems are chronic and stable.  Requiring increased assistance and becoming more difficult for family to care for at home.  Requires assistance and likely placement.  Since chronic medical problems are stable continue current medications.  No blood work due since he had blood work done just over a month ago.   See Problem List for Assessment and Plan of chronic medical problems.

## 2018-01-19 ENCOUNTER — Ambulatory Visit (INDEPENDENT_AMBULATORY_CARE_PROVIDER_SITE_OTHER): Payer: Medicare Other | Admitting: Internal Medicine

## 2018-01-19 ENCOUNTER — Encounter: Payer: Self-pay | Admitting: Internal Medicine

## 2018-01-19 VITALS — BP 110/58 | HR 52 | Temp 98.0°F | Resp 16 | Wt 140.0 lb

## 2018-01-19 DIAGNOSIS — G35 Multiple sclerosis: Secondary | ICD-10-CM

## 2018-01-19 DIAGNOSIS — M4134 Thoracogenic scoliosis, thoracic region: Secondary | ICD-10-CM | POA: Diagnosis not present

## 2018-01-19 DIAGNOSIS — E1159 Type 2 diabetes mellitus with other circulatory complications: Secondary | ICD-10-CM

## 2018-01-19 DIAGNOSIS — I5022 Chronic systolic (congestive) heart failure: Secondary | ICD-10-CM

## 2018-01-19 DIAGNOSIS — I2510 Atherosclerotic heart disease of native coronary artery without angina pectoris: Secondary | ICD-10-CM

## 2018-01-19 DIAGNOSIS — G8220 Paraplegia, unspecified: Secondary | ICD-10-CM | POA: Diagnosis not present

## 2018-01-19 DIAGNOSIS — Z794 Long term (current) use of insulin: Secondary | ICD-10-CM

## 2018-01-19 DIAGNOSIS — Z89611 Acquired absence of right leg above knee: Secondary | ICD-10-CM | POA: Diagnosis not present

## 2018-01-19 DIAGNOSIS — Z89612 Acquired absence of left leg above knee: Secondary | ICD-10-CM

## 2018-01-19 DIAGNOSIS — I10 Essential (primary) hypertension: Secondary | ICD-10-CM | POA: Diagnosis not present

## 2018-01-19 DIAGNOSIS — G3184 Mild cognitive impairment, so stated: Secondary | ICD-10-CM | POA: Diagnosis not present

## 2018-01-19 NOTE — Assessment & Plan Note (Signed)
Chronic, stable No evidence of fluid overload Continue current medication

## 2018-01-19 NOTE — Assessment & Plan Note (Signed)
Chronic, stable Contributes to poor posture

## 2018-01-19 NOTE — Assessment & Plan Note (Signed)
Chronic, stable 

## 2018-01-19 NOTE — Assessment & Plan Note (Signed)
Lab Results  Component Value Date   HGBA1C 7.0 (H) 11/25/2017   Overall controlled Continue current medication

## 2018-01-19 NOTE — Assessment & Plan Note (Signed)
BP well controlled Current regimen effective and well tolerated Continue current medications at current doses  

## 2018-01-19 NOTE — Assessment & Plan Note (Signed)
Chronic, stable No change in medication

## 2018-01-19 NOTE — Assessment & Plan Note (Signed)
Chronic No change Dependent of all transfers, wheelchair or bedbound

## 2018-01-19 NOTE — Patient Instructions (Signed)
FORM completed

## 2018-01-23 ENCOUNTER — Encounter: Payer: Self-pay | Admitting: Cardiology

## 2018-01-23 ENCOUNTER — Ambulatory Visit (INDEPENDENT_AMBULATORY_CARE_PROVIDER_SITE_OTHER): Payer: Medicare Other | Admitting: Cardiology

## 2018-01-23 ENCOUNTER — Encounter

## 2018-01-23 VITALS — BP 122/80 | HR 57 | Ht 71.0 in | Wt 127.0 lb

## 2018-01-23 DIAGNOSIS — E782 Mixed hyperlipidemia: Secondary | ICD-10-CM | POA: Diagnosis not present

## 2018-01-23 DIAGNOSIS — I5042 Chronic combined systolic (congestive) and diastolic (congestive) heart failure: Secondary | ICD-10-CM | POA: Diagnosis not present

## 2018-01-23 DIAGNOSIS — I255 Ischemic cardiomyopathy: Secondary | ICD-10-CM | POA: Diagnosis not present

## 2018-01-23 DIAGNOSIS — I2510 Atherosclerotic heart disease of native coronary artery without angina pectoris: Secondary | ICD-10-CM

## 2018-01-23 DIAGNOSIS — I10 Essential (primary) hypertension: Secondary | ICD-10-CM

## 2018-01-23 NOTE — Patient Instructions (Signed)
Medication Instructions:  Your physician recommends that you continue on your current medications as directed. Please refer to the Current Medication list given to you today.   Labwork: None ordered  Testing/Procedures: None ordered  Follow-Up: Your physician wants you to follow-up in: 6 months with Dr.Nelson You will receive a reminder letter in the mail two months in advance. If you don't receive a letter, please call our office to schedule the follow-up appointment.   Any Other Special Instructions Will Be Listed Below (If Applicable).     If you need a refill on your cardiac medications before your next appointment, please call your pharmacy.   

## 2018-01-23 NOTE — Progress Notes (Signed)
Cardiology Office Note    Date:  01/23/2018   ID:  ODA LANSDOWNE, DOB [birth year], MRN [id number]  PCP:  [name], MD  Cardiologist:  Dr Ron Parker -->  [name], MD   No chief complaint on file.  History of Present Illness:  Jaime [name] is a 77 y.o. male with h/o MS, s/p B/L AKA, wheelchair bound, incontinence, CAD, s/p PCI to LCX in 2007, with h/o ischemic CMP, the last LVEF 35% on echo in 07/2015, mildly reduced RVEF. H/O PAD.  He used to see Dr Ron Parker, the last visit in 2015.  07/11/2016 - The patient is coming after 6 months, he is in a great mood and states that he feels great. He has been admitted on 03/50/09 with UTI complicated by septic shock requiring Levophed.  He Went to the ER again on 07/08/16 with dizziness, fatigue and was found to be bradycardic on BB that were held. His symptoms have improved since then. He has hard time to cough out the phlem, no fever/ chills. Denies chest pain or DOE, no palpitations or syncope. He sleeps upright in a hospital bed and has no PND. No palpitations or syncope.  10/15/2016 - the patient is coming after 3 months, in the interim he states he has been feeling well. Denies any chest pain, shortness of breath, no palpitations or dizziness. He only gets out of bed for a short time every day he and doesn't like to leave the house. He appears to be in good spirits. He has had upper respiratory infection with productive cough however no fever or chills. He feels like the phlegm is stuck in his throat and he can't cough it out.  07/07/2017 - patient is coming after 9 months, he has been doing well from cardiac standpoint denies any chest pain shortness of breath, she sleeps in a flat position in bed and denies any orthopnea or paroxysmal nocturnal dyspnea. He has had poor appetite and lost 6 pounds in the last 6 months. His most serious concern are like pains and he attributes them phantom pains after amputation. They have been really bad  in the last couple weeks.  01/23/2018, the patient was hospitalized in April 2019 with Klebsiella UTI complicated by sepsis.  He has recovered from that.  He has developed significant sacral ulcer and pain and he spends most of his days in bed.  He has chronic cough and difficulty to cough up phlegm however no fever or chills.  It has been becoming more and more difficult for his wife to take care of him and they are considering placement.  He otherwise denies chest pain shortness of breath at rest no orthopnea or proximal nocturnal dyspnea.  Past Medical History:  Diagnosis Date  . Atherosclerotic PVD with ulceration ([place])    left foot  . CAD (coronary artery disease) CARDIOLOGIST- DR KATZ-- VISIT 06-05-2011 IN [facility]   non-STEMI, 2007.Marland Kitchenoccluded circumflex.. Taxus stent placed...residual 80% LAD...50% RCA  . Cardiomyopathy, ischemic    EF 45% per ECHO 2008  //   EF 25%, echo, August, 2013 //  Echo (8/15):  Mild LVH, EF 30-35%, ant-lat and lat AK, inf HK, Gr 1 DD, mild MR, mild LAE  . Carotid artery disease ([place])    a.  Doppler, February, 2012, 0-39% bilateral,Mild smooth plaque;  b.  Carotid US (8/15):  Bilateral 1-39% ICA >>> F/u 2 years  . Chronic indwelling Foley catheter   . Constipation   . Dementia   .  Diabetes mellitus    INSULIN-DEPENDent  . Fatigue SEVERE  . H/O pleural effusion 2008   POST THORACENTESIS  . History of colon polyps PRECANCEROUS  . Hyperlipidemia   . Hypertension   . Impotence   . Increased prostate specific antigen (PSA) velocity   . Insomnia   . Lung nodule    resolved 11-2006 CT Chest  . Multiple sclerosis ([place]) [place] -- LAST VISIT 11-20-2010  NOTE W/ CHART  . PAC (premature atrial contraction)    December, 2013  . Pulmonary hypertension ([place])    moderate ECHO Jan 2008  . Scoliosis associated with other condition   . Sleep apnea   . Systolic heart failure   . TIA (transient ischemic attack)   . Tobacco abuse    quit   .  Urinary retention    dx ~ 2-12, like from [place], now with a catheter, saw urology  . Urinary tract infection    hx of    Past Surgical History:  Procedure Laterality Date  . ABDOMINAL ANGIOGRAM  12/17/2011   Procedure: ABDOMINAL ANGIOGRAM;  Surgeon: Serafina Mitchell, MD;  Location[facility] C[facility] LAB;  Service: Cardiovascular;;  . ABDOMINAL AORTAGRAM N/A 08/19/2013   Procedure: ABDOMINAL Maxcine Ham;  Surgeon: Conrad Oilton, MD;  [facility] C[facility] LAB;  Service: Cardiovascular;  Laterality: N/A;  . AMPUTATION  03/17/2012   Procedure: AMPUTATION ABOVE KNEE;  Surgeon: Conrad [place]. [place], MD;  Location: [place];  Service: Vascular;  Laterality: Left;  . AMPUTATION  06/03/2012   Procedure: AMPUTATION ABOVE KNEE;  Surgeon: Conrad Paducah, MD;  Location: [place];  Service: Vascular;  Laterality: Right;  . CHOLECYSTECTOMY N/A 10/25/2014   Procedure: LAPAROSCOPIC CHOLECYSTECTOMY ;  Surgeon: Coralie Keens, MD;  Location: [place];  Service: General;  Laterality: N/A;  . CORONARY ANGIOPLASTY WITH STENT PLACEMENT  05-01-2006   OCCLUDED CIRCUMFLEX -- TAXUS STENT PLACMENT  AND RESIDUAL 80% LAD,  50% RCA  . CYSTOSCOPY  07/30/2011   Procedure: CYSTOSCOPY;  Surgeon: Hanley Ben, MD;  Location[facility];  Service: Urology;  Laterality: N/A;  . ERCP N/A 08/25/2014   Procedure: ENDOSCOPIC RETROGRADE CHOLANGIOPANCREATOGRAPHY (ERCP);  Surgeon: Ladene Artist, MD;  Location: [facility] ENDOSCOPY;  Service: Endoscopy;  Laterality: N/A;  . FEMORAL-POPLITEAL BYPASS GRAFT  03/11/2012   Procedure: BYPASS GRAFT FEMORAL-POPLITEAL ARTERY;  Surgeon: Conrad [place], MD;  Location: [place];  Service: Vascular;  Laterality: Left;  embolectomy left lower leg  . FEMORAL-TIBIAL BYPASS GRAFT  03/11/2012   Procedure: BYPASS GRAFT FEMORAL-TIBIAL ARTERY;  Surgeon: Conrad [place], MD;  Location: [facility] OR;  Service: Vascular;  Laterality: Left;  Left Femoral -Tibial trunk bypass, Endarterectomy of Tibial- Peroneal trunk with vein angioplasty.  Marland Kitchen HERNIA REPAIR   1990   (R)  . INTRAOPERATIVE ARTERIOGRAM  03/11/2012   Procedure: INTRA OPERATIVE ARTERIOGRAM;  Surgeon: Conrad [place], MD;  Location: [place];  Service: Vascular;  Laterality: Left;  . LOWER EXTREMITY ANGIOGRAM Bilateral 12/17/2011   Procedure: LOWER EXTREMITY ANGIOGRAM;  Surgeon: Serafina Mitchell, MD;  Location: [facility] CATH LAB;  Service: Cardiovascular;  Laterality: Bilateral;  bil lower extrem angio  . THORACENTESIS  2008   PLEURAL EFFUSION  . TRANSURETHRAL RESECTION OF PROSTATE  07/30/2011   Procedure: TRANSURETHRAL RESECTION OF THE PROSTATE (TURP);  Surgeon: Hanley Ben, MD;  Location: [facility];  Service: Urology;  Laterality: N/A;    Current Medications: Outpatient Medications Prior to Visit  Medication Sig Dispense Refill  . albuterol (PROVENTIL HFA;VENTOLIN HFA) 108 (90 Base) MCG/ACT inhaler Inhale 2 puffs into the lungs every 6 (six) hours as needed for wheezing or shortness of breath. 1 Inhaler 2  . aspirin EC 81 MG tablet Take 1 tablet (81 mg total) by mouth daily. 90 tablet 3  . azelastine (ASTELIN) 0.1 % nasal spray Place 2 sprays into both nostrils at bedtime as needed for rhinitis. Use in each nostril as directed 30 mL 3  . Cholecalciferol (VITAMIN D) 2000 units CAPS Take 1 capsule by mouth daily.     . diclofenac sodium (VOLTAREN) 1 % GEL Apply 1 application topically 2 (two) times daily as needed (pain). For pain    . furosemide (LASIX) 20 MG tablet Take 1 tablet (20 mg total) by mouth daily as needed for fluid or edema (or shortness of breath). 30 tablet 0  . glucagon (GLUCAGEN) 1 MG SOLR injection Inject 1 mg into the vein once as needed for low blood sugar. Reported on 11/16/2015    . insulin aspart protamine - aspart (NOVOLOG 70/30 MIX) (70-30) 100 UNIT/ML FlexPen Inject 0.05 mLs (5 Units total) into the skin 2 (two) times daily. 15 mL 5  . lisinopril (PRINIVIL,ZESTRIL) 10 MG tablet TAKE 1 TABLET BY MOUTH EVERY DAY 30 tablet 5  . Multiple Vitamins-Minerals  (MULTIVITAMIN WITH MINERALS) tablet Take 1 tablet by mouth daily.    . nicotine (NICODERM CQ - DOSED IN MG/24 HOURS) 14 mg/24hr patch Place 1 patch (14 mg total) onto the skin daily. 28 patch 0  . pantoprazole (PROTONIX) 40 MG tablet Take 1 tablet (40 mg total) by mouth daily with lunch. 90 tablet 1  . pregabalin (LYRICA) 25 MG capsule Take 1 capsule (25 mg total) by mouth daily. 30 capsule 0  . rosuvastatin (CRESTOR) 20 MG tablet Take 1 tablet (20 mg total) by mouth daily. 30 tablet 3  . traMADol (ULTRAM) 50 MG tablet Take 1 tablet (50 mg total) by mouth every 4 (four) hours as needed. 120 tablet 5   No facility-administered medications prior to visit.     Allergies:   Bee venom; Beta adrenergic blockers; Carvedilol; Influenza vaccines; and Atorvastatin   Social History   Socioeconomic History  . Marital status: Married    Spouse name: Not on file  . Number of children: 2  . Years of education: Not on file  . Highest education level: Not on file  Occupational History  . Occupation: disable    Employer: RETIRED  Social Needs  . Financial resource strain: Not on file  . Food insecurity:    Worry: Not on file    Inability: Not on file  . Transportation needs:    Medical: Not on file    Non-medical: Not on file  Tobacco Use  . Smoking status: Former Smoker    Years: 50.00    Last attempt to quit: 07/22/2012    Years since quitting: 5.5  . Smokeless tobacco: Current User  . Tobacco comment: pt states that he is using E-cigs  Substance and Sexual Activity  . Alcohol use: No    Alcohol/week: 0.0 oz  . Drug use: No  . Sexual activity: Never    Comment: electronic cigarettes no nicotene  Lifestyle  . Physical activity:    Days per week: Not on file    Minutes per session: Not on file  . Stress: Not on file  Relationships  . Social connections:    Talks on  phone: Not on file    Gets together: Not on file    Attends religious service: Not on file    Active member of club or  organization: Not on file    Attends meetings of clubs or organizations: Not on file    Relationship status: Not on file  Other Topics Concern  . Not on file  Social History Narrative   Lives w/ wife    Family History:  The patient's family history includes COPD in his father; Diabetes in his brother and daughter; Heart attack in his brother; Heart attack (age of onset: 53) in his mother; Heart disease in his brother and mother; Hyperlipidemia in his mother; Hypertension in his brother and mother; Peripheral vascular disease in his father; Stroke in his mother.   ROS:   Please see the history of present illness.    ROS All other systems reviewed and are negative.  PHYSICAL EXAM:   VS:  BP 122/80   Pulse (!) 57   Ht 5\' 11"  (1.803 m)   Wt 127 lb (57.6 kg)   SpO2 94%   BMI 17.71 kg/m    GEN: in no acute distress, in an electric wheelchair, with difficulties to move HEENT: normal  Neck: no JVD, carotid bruits, or masses Cardiac: RRR; no murmurs, rubs, or gallops,no edema, AKA ampuation B/L Respiratory:  clear to auscultation bilaterally, normal work of breathing, no wheezing. GI: soft, nontender, nondistended, + BS Skin: warm and dry, no rash Neuro:  Alert and Oriented x 3, poor strength in his arms and upper body.  Wt Readings from Last 3 Encounters:  01/23/18 127 lb (57.6 kg)  01/19/18 140 lb (63.5 kg)  12/08/17 140 lb (63.5 kg)    Studies/Labs Reviewed:   EKG:  EKG is not ordered today.   Recent Labs: 10/30/2017: Magnesium 1.9 11/25/2017: ALT 11; BUN 13; Creatinine, Ser 0.58; Hemoglobin 13.0; Platelets 178.0; Potassium 3.8; Sodium 142; TSH 0.39   Lipid Panel    Component Value Date/Time   CHOL 110 11/25/2017 1113   TRIG 68.0 11/25/2017 1113   HDL 32.60 (L) 11/25/2017 1113   CHOLHDL 3 11/25/2017 1113   VLDL 13.6 11/25/2017 1113   LDLCALC 64 11/25/2017 1113   LDLDIRECT 123.5 03/17/2008 1049   Additional studies/ records that were reviewed today include:    08/14/2015 Left ventricle: The cavity size was normal. Systolic function was  moderately reduced. The estimated ejection fraction was 35%.  Diffuse hypokinesis worse in the inferior and inferolateral  regions. Doppler parameters are consistent with abnormal left  ventricular relaxation (grade 1 diastolic dysfunction). - Mitral valve: There was mild regurgitation. - Right ventricle: The cavity size was normal. Wall thickness was  normal. Systolic function was mildly reduced. - Tricuspid valve: There was mild regurgitation. - Inferior vena cava: The vessel was normal in size. The  respirophasic diameter changes were in the normal range (>= 50%),  consistent with normal central venous pressure.    ASSESSMENT:    1. Coronary artery disease involving native coronary artery of native heart without angina pectoris   2. Chronic combined systolic and diastolic CHF (congestive heart failure) (HCC)   3. Cardiomyopathy, ischemic   4. Essential hypertension    PLAN:  In order of problems listed above:  1. CAD - asymptomatic, No EKG today, however patient asymptomatic, will continue medical management for CAD, no ischemic workup is needed right now.  Continue aspirin 81 mg daily.  2. Ischemic CMP, lVEF stable at 35%, no  palpitations or syncope,  no BB as has recurrent bradycardia, continue lisinopril.  3. Chronic systolic CHF - no evidence of fluid overload. Continue Lasix 20 mg daily.  His labs were normal including electrolytes and creatinine in May 2019  4. Hypertension - controlled, continue current regimen.  5.  Hyperlipidemia, he is tolerating rosuvastatin.  Medication Adjustments/Labs and Tests Ordered: Current medicines are reviewed at length with the patient today.  Concerns regarding medicines are outlined above.  Medication changes, Labs and Tests ordered today are listed in the Patient Instructions below. Patient Instructions  Medication Instructions:  Your physician  recommends that you continue on your current medications as directed. Please refer to the Current Medication list given to you today.   Labwork: None ordered  Testing/Procedures: None ordered  Follow-Up: Your physician wants you to follow-up in: 6 months with Dr. Meda Coffee. You will receive a reminder letter in the mail two months in advance. If you don't receive a letter, please call our office to schedule the follow-up appointment.   Any Other Special Instructions Will Be Listed Below (If Applicable).     If you need a refill on your cardiac medications before your next appointment, please call your pharmacy.      Signed, [name], MD  01/23/2018 11:14 AM    [facility] [address], [place], [place]  [address] Phone: [phone]; Fax: [phone]

## 2018-01-31 DIAGNOSIS — G35 Multiple sclerosis: Secondary | ICD-10-CM | POA: Diagnosis not present

## 2018-01-31 DIAGNOSIS — I10 Essential (primary) hypertension: Secondary | ICD-10-CM | POA: Diagnosis not present

## 2018-01-31 DIAGNOSIS — I509 Heart failure, unspecified: Secondary | ICD-10-CM | POA: Diagnosis not present

## 2018-01-31 DIAGNOSIS — E1165 Type 2 diabetes mellitus with hyperglycemia: Secondary | ICD-10-CM | POA: Diagnosis not present

## 2018-02-27 ENCOUNTER — Telehealth: Payer: Self-pay | Admitting: Neurology

## 2018-02-27 DIAGNOSIS — I509 Heart failure, unspecified: Secondary | ICD-10-CM | POA: Diagnosis not present

## 2018-02-27 DIAGNOSIS — R109 Unspecified abdominal pain: Secondary | ICD-10-CM | POA: Diagnosis not present

## 2018-02-27 DIAGNOSIS — Z960 Presence of urogenital implants: Secondary | ICD-10-CM | POA: Diagnosis not present

## 2018-02-27 DIAGNOSIS — E1165 Type 2 diabetes mellitus with hyperglycemia: Secondary | ICD-10-CM | POA: Diagnosis not present

## 2018-02-27 DIAGNOSIS — I10 Essential (primary) hypertension: Secondary | ICD-10-CM | POA: Diagnosis not present

## 2018-02-27 NOTE — Telephone Encounter (Signed)
Patient's wife called to say that his Lyrica is being Denied. She is unsure why? He uses [facility] on [address]. She said they were faxing a Dr. [name] in [place]? Please Call. Thanks

## 2018-03-03 NOTE — Telephone Encounter (Signed)
[facility], spoke with Jenny Reichmann, gave verbal for 150 mg #90 r3 , 1qam; 2 qpm  Called and spoke with [name] and advised her

## 2018-03-16 DIAGNOSIS — Z794 Long term (current) use of insulin: Secondary | ICD-10-CM | POA: Diagnosis not present

## 2018-03-16 DIAGNOSIS — H2513 Age-related nuclear cataract, bilateral: Secondary | ICD-10-CM | POA: Diagnosis not present

## 2018-03-16 DIAGNOSIS — E119 Type 2 diabetes mellitus without complications: Secondary | ICD-10-CM | POA: Diagnosis not present

## 2018-03-16 DIAGNOSIS — H25013 Cortical age-related cataract, bilateral: Secondary | ICD-10-CM | POA: Diagnosis not present

## 2018-03-16 LAB — HM DIABETES EYE EXAM

## 2018-03-20 ENCOUNTER — Encounter: Payer: Self-pay | Admitting: Internal Medicine

## 2018-03-27 ENCOUNTER — Emergency Department (HOSPITAL_COMMUNITY): Payer: Medicare Other

## 2018-03-27 ENCOUNTER — Encounter (HOSPITAL_COMMUNITY): Payer: Self-pay | Admitting: Emergency Medicine

## 2018-03-27 ENCOUNTER — Inpatient Hospital Stay (HOSPITAL_COMMUNITY)
Admission: EM | Admit: 2018-03-27 | Discharge: 2018-04-02 | DRG: 871 | Disposition: A | Discharge status: Home / Self Care | Payer: Medicare Other | Attending: Internal Medicine | Admitting: Internal Medicine

## 2018-03-27 DIAGNOSIS — Z833 Family history of diabetes mellitus: Secondary | ICD-10-CM

## 2018-03-27 DIAGNOSIS — I272 Pulmonary hypertension, unspecified: Secondary | ICD-10-CM | POA: Diagnosis present

## 2018-03-27 DIAGNOSIS — Z89611 Acquired absence of right leg above knee: Secondary | ICD-10-CM | POA: Diagnosis not present

## 2018-03-27 DIAGNOSIS — Z9079 Acquired absence of other genital organ(s): Secondary | ICD-10-CM

## 2018-03-27 DIAGNOSIS — R7881 Bacteremia: Secondary | ICD-10-CM | POA: Diagnosis not present

## 2018-03-27 DIAGNOSIS — B961 Klebsiella pneumoniae [K. pneumoniae] as the cause of diseases classified elsewhere: Secondary | ICD-10-CM | POA: Diagnosis present

## 2018-03-27 DIAGNOSIS — Z8673 Personal history of transient ischemic attack (TIA), and cerebral infarction without residual deficits: Secondary | ICD-10-CM

## 2018-03-27 DIAGNOSIS — B965 Pseudomonas (aeruginosa) (mallei) (pseudomallei) as the cause of diseases classified elsewhere: Secondary | ICD-10-CM | POA: Diagnosis present

## 2018-03-27 DIAGNOSIS — E1140 Type 2 diabetes mellitus with diabetic neuropathy, unspecified: Secondary | ICD-10-CM | POA: Diagnosis present

## 2018-03-27 DIAGNOSIS — Z8349 Family history of other endocrine, nutritional and metabolic diseases: Secondary | ICD-10-CM

## 2018-03-27 DIAGNOSIS — I4510 Unspecified right bundle-branch block: Secondary | ICD-10-CM | POA: Diagnosis not present

## 2018-03-27 DIAGNOSIS — F17290 Nicotine dependence, other tobacco product, uncomplicated: Secondary | ICD-10-CM | POA: Diagnosis present

## 2018-03-27 DIAGNOSIS — N281 Cyst of kidney, acquired: Secondary | ICD-10-CM | POA: Diagnosis not present

## 2018-03-27 DIAGNOSIS — R748 Abnormal levels of other serum enzymes: Secondary | ICD-10-CM | POA: Diagnosis not present

## 2018-03-27 DIAGNOSIS — A4159 Other Gram-negative sepsis: Principal | ICD-10-CM | POA: Diagnosis present

## 2018-03-27 DIAGNOSIS — Z9049 Acquired absence of other specified parts of digestive tract: Secondary | ICD-10-CM

## 2018-03-27 DIAGNOSIS — I248 Other forms of acute ischemic heart disease: Secondary | ICD-10-CM | POA: Diagnosis present

## 2018-03-27 DIAGNOSIS — I5022 Chronic systolic (congestive) heart failure: Secondary | ICD-10-CM | POA: Diagnosis present

## 2018-03-27 DIAGNOSIS — F0390 Unspecified dementia without behavioral disturbance: Secondary | ICD-10-CM | POA: Diagnosis present

## 2018-03-27 DIAGNOSIS — Z89612 Acquired absence of left leg above knee: Secondary | ICD-10-CM

## 2018-03-27 DIAGNOSIS — R6521 Severe sepsis with septic shock: Secondary | ICD-10-CM | POA: Diagnosis present

## 2018-03-27 DIAGNOSIS — R778 Other specified abnormalities of plasma proteins: Secondary | ICD-10-CM | POA: Diagnosis present

## 2018-03-27 DIAGNOSIS — G4700 Insomnia, unspecified: Secondary | ICD-10-CM | POA: Diagnosis present

## 2018-03-27 DIAGNOSIS — G35 Multiple sclerosis: Secondary | ICD-10-CM | POA: Diagnosis present

## 2018-03-27 DIAGNOSIS — I110 Hypertensive heart disease with heart failure: Secondary | ICD-10-CM | POA: Diagnosis present

## 2018-03-27 DIAGNOSIS — I7025 Atherosclerosis of native arteries of other extremities with ulceration: Secondary | ICD-10-CM | POA: Diagnosis present

## 2018-03-27 DIAGNOSIS — I255 Ischemic cardiomyopathy: Secondary | ICD-10-CM | POA: Diagnosis present

## 2018-03-27 DIAGNOSIS — E785 Hyperlipidemia, unspecified: Secondary | ICD-10-CM | POA: Diagnosis present

## 2018-03-27 DIAGNOSIS — N400 Enlarged prostate without lower urinary tract symptoms: Secondary | ICD-10-CM | POA: Diagnosis present

## 2018-03-27 DIAGNOSIS — A4181 Sepsis due to Enterococcus: Secondary | ICD-10-CM | POA: Diagnosis not present

## 2018-03-27 DIAGNOSIS — Z7982 Long term (current) use of aspirin: Secondary | ICD-10-CM

## 2018-03-27 DIAGNOSIS — I2510 Atherosclerotic heart disease of native coronary artery without angina pectoris: Secondary | ICD-10-CM | POA: Diagnosis present

## 2018-03-27 DIAGNOSIS — Z91030 Bee allergy status: Secondary | ICD-10-CM

## 2018-03-27 DIAGNOSIS — I214 Non-ST elevation (NSTEMI) myocardial infarction: Secondary | ICD-10-CM | POA: Diagnosis not present

## 2018-03-27 DIAGNOSIS — R404 Transient alteration of awareness: Secondary | ICD-10-CM | POA: Diagnosis not present

## 2018-03-27 DIAGNOSIS — Z87892 Personal history of anaphylaxis: Secondary | ICD-10-CM

## 2018-03-27 DIAGNOSIS — R945 Abnormal results of liver function studies: Secondary | ICD-10-CM | POA: Diagnosis not present

## 2018-03-27 DIAGNOSIS — E119 Type 2 diabetes mellitus without complications: Secondary | ICD-10-CM | POA: Diagnosis not present

## 2018-03-27 DIAGNOSIS — G4730 Sleep apnea, unspecified: Secondary | ICD-10-CM | POA: Diagnosis present

## 2018-03-27 DIAGNOSIS — R0902 Hypoxemia: Secondary | ICD-10-CM | POA: Diagnosis not present

## 2018-03-27 DIAGNOSIS — R52 Pain, unspecified: Secondary | ICD-10-CM | POA: Diagnosis not present

## 2018-03-27 DIAGNOSIS — K7200 Acute and subacute hepatic failure without coma: Secondary | ICD-10-CM | POA: Diagnosis present

## 2018-03-27 DIAGNOSIS — R410 Disorientation, unspecified: Secondary | ICD-10-CM | POA: Diagnosis not present

## 2018-03-27 DIAGNOSIS — N390 Urinary tract infection, site not specified: Secondary | ICD-10-CM | POA: Diagnosis present

## 2018-03-27 DIAGNOSIS — I252 Old myocardial infarction: Secondary | ICD-10-CM

## 2018-03-27 DIAGNOSIS — A419 Sepsis, unspecified organism: Secondary | ICD-10-CM

## 2018-03-27 DIAGNOSIS — J189 Pneumonia, unspecified organism: Secondary | ICD-10-CM | POA: Diagnosis not present

## 2018-03-27 DIAGNOSIS — Z87440 Personal history of urinary (tract) infections: Secondary | ICD-10-CM

## 2018-03-27 DIAGNOSIS — Z8249 Family history of ischemic heart disease and other diseases of the circulatory system: Secondary | ICD-10-CM

## 2018-03-27 DIAGNOSIS — E1151 Type 2 diabetes mellitus with diabetic peripheral angiopathy without gangrene: Secondary | ICD-10-CM | POA: Diagnosis present

## 2018-03-27 DIAGNOSIS — R0689 Other abnormalities of breathing: Secondary | ICD-10-CM | POA: Diagnosis not present

## 2018-03-27 DIAGNOSIS — Z887 Allergy status to serum and vaccine status: Secondary | ICD-10-CM

## 2018-03-27 DIAGNOSIS — K219 Gastro-esophageal reflux disease without esophagitis: Secondary | ICD-10-CM | POA: Diagnosis present

## 2018-03-27 DIAGNOSIS — B952 Enterococcus as the cause of diseases classified elsewhere: Secondary | ICD-10-CM | POA: Diagnosis present

## 2018-03-27 DIAGNOSIS — M419 Scoliosis, unspecified: Secondary | ICD-10-CM | POA: Diagnosis present

## 2018-03-27 DIAGNOSIS — Z89511 Acquired absence of right leg below knee: Secondary | ICD-10-CM | POA: Diagnosis not present

## 2018-03-27 DIAGNOSIS — Z91040 Latex allergy status: Secondary | ICD-10-CM

## 2018-03-27 DIAGNOSIS — R7989 Other specified abnormal findings of blood chemistry: Secondary | ICD-10-CM

## 2018-03-27 DIAGNOSIS — I10 Essential (primary) hypertension: Secondary | ICD-10-CM | POA: Diagnosis not present

## 2018-03-27 DIAGNOSIS — Z89512 Acquired absence of left leg below knee: Secondary | ICD-10-CM | POA: Diagnosis not present

## 2018-03-27 DIAGNOSIS — Z79899 Other long term (current) drug therapy: Secondary | ICD-10-CM

## 2018-03-27 DIAGNOSIS — Z888 Allergy status to other drugs, medicaments and biological substances status: Secondary | ICD-10-CM

## 2018-03-27 DIAGNOSIS — Z7401 Bed confinement status: Secondary | ICD-10-CM

## 2018-03-27 DIAGNOSIS — R413 Other amnesia: Secondary | ICD-10-CM | POA: Diagnosis not present

## 2018-03-27 DIAGNOSIS — Z955 Presence of coronary angioplasty implant and graft: Secondary | ICD-10-CM

## 2018-03-27 HISTORY — DX: Bacteremia: R78.81

## 2018-03-27 LAB — CBC WITH DIFFERENTIAL/PLATELET
Abs Immature Granulocytes: 0.1 10*3/uL (ref 0.0–0.1)
BASOS PCT: 0 %
Basophils Absolute: 0 10*3/uL (ref 0.0–0.1)
EOS ABS: 0 10*3/uL (ref 0.0–0.7)
Eosinophils Relative: 0 %
HEMATOCRIT: 43.1 % (ref 39.0–52.0)
Hemoglobin: 13.9 g/dL (ref 13.0–17.0)
IMMATURE GRANULOCYTES: 1 %
LYMPHS ABS: 0.6 10*3/uL — AB (ref 0.7–4.0)
Lymphocytes Relative: 5 %
MCH: 26.1 pg (ref 26.0–34.0)
MCHC: 32.3 g/dL (ref 30.0–36.0)
MCV: 80.9 fL (ref 78.0–100.0)
Monocytes Absolute: 0.6 10*3/uL (ref 0.1–1.0)
Monocytes Relative: 5 %
NEUTROS PCT: 89 %
Neutro Abs: 11 10*3/uL — ABNORMAL HIGH (ref 1.7–7.7)
Platelets: 190 10*3/uL (ref 150–400)
RBC: 5.33 MIL/uL (ref 4.22–5.81)
RDW: 17.2 % — AB (ref 11.5–15.5)
WBC: 12.3 10*3/uL — ABNORMAL HIGH (ref 4.0–10.5)

## 2018-03-27 LAB — URINALYSIS, ROUTINE W REFLEX MICROSCOPIC
Bilirubin Urine: NEGATIVE
Glucose, UA: NEGATIVE mg/dL
Ketones, ur: 5 mg/dL — AB
NITRITE: POSITIVE — AB
Protein, ur: NEGATIVE mg/dL
RBC / HPF: >50 RBC/hpf — ABNORMAL HIGH (ref 0–5)
SPECIFIC GRAVITY, URINE: 1.014 (ref 1.005–1.030)
pH: 5 (ref 5.0–8.0)

## 2018-03-27 LAB — COMPREHENSIVE METABOLIC PANEL
ALT: 259 U/L — AB (ref 0–44)
AST: 276 U/L — ABNORMAL HIGH (ref 15–41)
Albumin: 3.3 g/dL — ABNORMAL LOW (ref 3.5–5.0)
Alkaline Phosphatase: 340 U/L — ABNORMAL HIGH (ref 38–126)
Anion gap: 13 (ref 5–15)
BUN: 8 mg/dL (ref 8–23)
CHLORIDE: 106 mmol/L (ref 98–111)
CO2: 18 mmol/L — AB (ref 22–32)
CREATININE: 0.71 mg/dL (ref 0.61–1.24)
Calcium: 8.8 mg/dL — ABNORMAL LOW (ref 8.9–10.3)
GFR calc non Af Amer: >60 mL/min (ref >60)
Glucose, Bld: 232 mg/dL — ABNORMAL HIGH (ref 70–99)
Potassium: 3.7 mmol/L (ref 3.5–5.1)
SODIUM: 137 mmol/L (ref 135–145)
Total Bilirubin: 3.7 mg/dL — ABNORMAL HIGH (ref 0.3–1.2)
Total Protein: 7.2 g/dL (ref 6.5–8.1)

## 2018-03-27 LAB — I-STAT TROPONIN, ED
TROPONIN I, POC: 0.08 ng/mL (ref 0.00–0.08)
TROPONIN I, POC: 0.1 ng/mL — AB (ref 0.00–0.08)

## 2018-03-27 LAB — I-STAT CG4 LACTIC ACID, ED
Lactic Acid, Venous: 2.87 mmol/L (ref 0.5–1.9)
Lactic Acid, Venous: 2.9 mmol/L (ref 0.5–1.9)

## 2018-03-27 MED ORDER — ASPIRIN 81 MG PO CHEW
324.0000 mg | CHEWABLE_TABLET | Freq: Once | ORAL | Status: AC
  Administered 2018-03-27: 324 mg via ORAL
  Filled 2018-03-27: qty 4

## 2018-03-27 MED ORDER — LACTATED RINGERS IV BOLUS (SEPSIS)
1000.0000 mL | Freq: Once | INTRAVENOUS | Status: AC
  Administered 2018-03-27: 1000 mL via INTRAVENOUS

## 2018-03-27 MED ORDER — VANCOMYCIN HCL IN DEXTROSE 1-5 GM/200ML-% IV SOLN
1000.0000 mg | Freq: Once | INTRAVENOUS | Status: AC
  Administered 2018-03-27: 1000 mg via INTRAVENOUS
  Filled 2018-03-27: qty 200

## 2018-03-27 MED ORDER — HEPARIN BOLUS VIA INFUSION
4000.0000 [IU] | Freq: Once | INTRAVENOUS | Status: AC
  Administered 2018-03-27: 4000 [IU] via INTRAVENOUS
  Filled 2018-03-27: qty 4000

## 2018-03-27 MED ORDER — SODIUM CHLORIDE 0.9 % IV SOLN
2.0000 g | Freq: Three times a day (TID) | INTRAVENOUS | Status: DC
  Administered 2018-03-28: 2 g via INTRAVENOUS
  Filled 2018-03-27 (×2): qty 2

## 2018-03-27 MED ORDER — SODIUM CHLORIDE 0.9 % IV SOLN
2.0000 g | Freq: Once | INTRAVENOUS | Status: AC
  Administered 2018-03-27: 2 g via INTRAVENOUS
  Filled 2018-03-27: qty 2

## 2018-03-27 MED ORDER — METRONIDAZOLE IN NACL 5-0.79 MG/ML-% IV SOLN
500.0000 mg | Freq: Three times a day (TID) | INTRAVENOUS | Status: DC
  Administered 2018-03-27: 500 mg via INTRAVENOUS
  Filled 2018-03-27: qty 100

## 2018-03-27 MED ORDER — HEPARIN (PORCINE) IN NACL 100-0.45 UNIT/ML-% IJ SOLN
850.0000 [IU]/h | INTRAMUSCULAR | Status: DC
  Administered 2018-03-27: 800 [IU]/h via INTRAVENOUS
  Filled 2018-03-27: qty 250

## 2018-03-27 MED ORDER — LACTATED RINGERS IV BOLUS (SEPSIS)
500.0000 mL | Freq: Once | INTRAVENOUS | Status: AC
  Administered 2018-03-27: 500 mL via INTRAVENOUS

## 2018-03-27 MED ORDER — VANCOMYCIN HCL IN DEXTROSE 1-5 GM/200ML-% IV SOLN
1000.0000 mg | Freq: Two times a day (BID) | INTRAVENOUS | Status: DC
  Administered 2018-03-28: 1000 mg via INTRAVENOUS
  Filled 2018-03-27: qty 200

## 2018-03-27 MED ORDER — LACTATED RINGERS IV BOLUS (SEPSIS)
250.0000 mL | Freq: Once | INTRAVENOUS | Status: AC
  Administered 2018-03-27: 250 mL via INTRAVENOUS

## 2018-03-27 NOTE — ED Provider Notes (Signed)
[facility] EMERGENCY DEPARTMENT Provider Note   CSN: [id number] Arrival date & time: 03/27/18  1834     History   Chief Complaint Chief Complaint  Patient presents with  . Blood Infection    HPI Jaime [name] is a 77 y.o. male.  The history is provided by the patient, the spouse and the EMS personnel.  Altered Mental Status   This is a new problem. The current episode started 3 to 5 hours ago. The problem has been gradually worsening. Associated symptoms include confusion. His past medical history is significant for heart disease.    Past Medical History:  Diagnosis Date  . Atherosclerotic PVD with ulceration ([place])    left foot  . CAD (coronary artery disease) CARDIOLOGIST- DR KATZ-- VISIT 06-05-2011 IN [facility]   non-STEMI, 2007.Marland Kitchenoccluded circumflex.. Taxus stent placed...residual 80% LAD...50% RCA  . Cardiomyopathy, ischemic    EF 45% per ECHO 2008  //   EF 25%, echo, August, 2013 //  Echo (8/15):  Mild LVH, EF 30-35%, ant-lat and lat AK, inf HK, Gr 1 DD, mild MR, mild LAE  . Carotid artery disease ([place])    a.  Doppler, February, 2012, 0-39% bilateral,Mild smooth plaque;  b.  Carotid US (8/15):  Bilateral 1-39% ICA >>> F/u 2 years  . Chronic indwelling Foley catheter   . Constipation   . Dementia   . Diabetes mellitus    INSULIN-DEPENDent  . Fatigue SEVERE  . H/O pleural effusion 2008   POST THORACENTESIS  . History of colon polyps PRECANCEROUS  . Hyperlipidemia   . Hypertension   . Impotence   . Increased prostate specific antigen (PSA) velocity   . Insomnia   . Lung nodule    resolved 11-2006 CT Chest  . Multiple sclerosis ([place]) [facility] -- LAST VISIT 11-20-2010  NOTE W/ CHART  . PAC (premature atrial contraction)    December, 2013  . Pulmonary hypertension ([place])    moderate ECHO Jan 2008  . Scoliosis associated with other condition   . Sleep apnea   . Systolic heart failure   . TIA (transient ischemic attack)     . Tobacco abuse    quit   . Urinary retention    dx ~ 2-12, like from [place], now with a catheter, saw urology  . Urinary tract infection    hx of    Patient Active Problem List   Diagnosis Date Noted  . Pneumonia 03/27/2018  . UTI due to Klebsiella species 11/24/2017  . Hypoglycemia   . Cool skin 07/18/2017  . Hematuria 04/29/2017  . GERD (gastroesophageal reflux disease) 04/10/2016  . Coronary artery disease involving native coronary artery of native heart without angina pectoris 12/28/2015  . PAD (peripheral artery disease) ([place]) 12/28/2015  . Bradycardia 08/14/2015  . Chronic systolic heart failure ([place]) 08/14/2015  . Phantom limb pain ([place]) 12/08/2014  . Syncope 11/01/2014  . Sepsis ([place]) 08/18/2014  . Acute lower UTI 08/18/2014  . Elevated troponin 08/18/2014  . 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 Paraparesis of both lower limbs ([place]) 05/31/2014  . Scoliosis, thoracogenic, acquired 03/15/2014  . Mild cognitive impairment 02/11/2014  . PVD (peripheral vascular disease) ([place]) 08/13/2013  . 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 PAC (premature atrial contraction)   . Peripheral vascular disease, unspecified ([place]) 07/03/2012  . 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 Ejection fraction   . Cardiomyopathy,  ischemic   . Pulmonary hypertension ([place])   . Hemiparesis ([place])   . Essential hypertension   . Carotid artery disease ([place])   . Hyperlipidemia 07/31/2007  . BPH (benign prostatic hyperplasia) 04/15/2007  . DMII (diabetes mellitus, type 2) ([place]) 12/18/2006    Past Surgical History:  Procedure Laterality Date  . ABDOMINAL ANGIOGRAM  12/17/2011   Procedure: ABDOMINAL ANGIOGRAM;  Surgeon: Serafina Mitchell, MD;  Location: [facility] CATH LAB;  Service: Cardiovascular;;  . ABDOMINAL AORTAGRAM N/A 08/19/2013   Procedure: ABDOMINAL Maxcine Ham;  Surgeon: Conrad Stratton, MD;  Location: [facility] CATH LAB;  Service:  Cardiovascular;  Laterality: N/A;  . AMPUTATION  03/17/2012   Procedure: AMPUTATION ABOVE KNEE;  Surgeon: Conrad Lucas, MD;  Location: [facility];  Service: Vascular;  Laterality: Left;  . AMPUTATION  06/03/2012   Procedure: AMPUTATION ABOVE KNEE;  Surgeon: Conrad Storrs, MD;  Location: [place];  Service: Vascular;  Laterality: Right;  . CHOLECYSTECTOMY N/A 10/25/2014   Procedure: LAPAROSCOPIC CHOLECYSTECTOMY ;  Surgeon: Coralie Keens, MD;  Location: [place];  Service: General;  Laterality: N/A;  . CORONARY ANGIOPLASTY WITH STENT PLACEMENT  05-01-2006   OCCLUDED CIRCUMFLEX -- TAXUS STENT PLACMENT  AND RESIDUAL 80% LAD,  50% RCA  . CYSTOSCOPY  07/30/2011   Procedure: CYSTOSCOPY;  Surgeon: Hanley Ben, MD;  Location: [facility];  Service: Urology;  Laterality: N/A;  . ERCP N/A 08/25/2014   Procedure: ENDOSCOPIC RETROGRADE CHOLANGIOPANCREATOGRAPHY (ERCP);  Surgeon: Ladene Artist, MD;  Location: [facility] ENDOSCOPY;  Service: Endoscopy;  Laterality: N/A;  . FEMORAL-POPLITEAL BYPASS GRAFT  03/11/2012   Procedure: BYPASS GRAFT FEMORAL-POPLITEAL ARTERY;  Surgeon: Conrad Acalanes Ridge, MD;  Location: [facility];  Service: Vascular;  Laterality: Left;  embolectomy left lower leg  . FEMORAL-TIBIAL BYPASS GRAFT  03/11/2012   Procedure: BYPASS GRAFT FEMORAL-TIBIAL ARTERY;  Surgeon: Conrad [place], MD;  Location[facility];  Service: Vascular;  Laterality: Left;  Left Femoral -Tibial trunk bypass, Endarterectomy of Tibial- Peroneal trunk with vein angioplasty.  Marland Kitchen HERNIA REPAIR  1990   (R)  . INTRAOPERATIVE ARTERIOGRAM  03/11/2012   Procedure: INTRA OPERATIVE ARTERIOGRAM;  Surgeon: Conrad [place]field, MD;  Location: [facility];  Service: Vascular;  Laterality: Left;  . LOWER EXTREMITY ANGIOGRAM Bilateral 12/17/2011   Procedure: LOWER EXTREMITY ANGIOGRAM;  Surgeon: Serafina Mitchell, MD;  Location[facility] CATH LAB;  Service: Cardiovascular;  Laterality: Bilateral;  bil lower extrem angio  . THORACENTESIS  2008   PLEURAL EFFUSION  .  TRANSURETHRAL RESECTION OF PROSTATE  07/30/2011   Procedure: TRANSURETHRAL RESECTION OF THE PROSTATE (TURP);  Surgeon: Hanley Ben, MD;  Location: [facility];  Service: Urology;  Laterality: N/A;        Home Medications    Prior to Admission medications   Medication Sig Start Date End Date Taking? Authorizing Provider  albuterol (PROVENTIL HFA;VENTOLIN HFA) 108 (90 Base) MCG/ACT inhaler Inhale 2 puffs into the lungs every 6 (six) hours as needed for wheezing or shortness of breath. 12/28/15  Yes Dorothy Spark, MD  aspirin EC 81 MG tablet Take 1 tablet (81 mg total) by mouth daily. 07/07/17  Yes Dorothy Spark, MD  Cholecalciferol (VITAMIN D-3) 1000 units CAPS Take 2,000 Units by mouth daily.   Yes [provider]  diclofenac sodium (VOLTAREN) 1 % GEL Apply 1 application topically 2 (two) times daily as needed (FOR PAIN).  03/09/12  Yes Kirsteins, Luanna Salk, MD  furosemide (LASIX) 20 MG tablet Take  1 tablet (20 mg total) by mouth daily as needed for fluid or edema (or shortness of breath). 11/01/17  Yes Lavina Hamman, MD  glucagon (GLUCAGEN) 1 MG SOLR injection Inject 1 mg into the muscle once as needed for low blood sugar. Reported on 11/16/2015   Yes [provider]  guaiFENesin (ROBITUSSIN) 100 MG/5ML SOLN Take 5 mLs by mouth every 4 (four) hours as needed for cough or to loosen phlegm.   Yes [provider]  insulin aspart protamine - aspart (NOVOLOG 70/30 MIX) (70-30) 100 UNIT/ML FlexPen Inject 0.05 mLs (5 Units total) into the skin 2 (two) times daily. Patient taking differently: Inject 5 Units into the skin 2 (two) times daily as needed (if BGL is >100).  03/12/17  Yes Burns, Claudina Lick, MD  lisinopril (PRINIVIL,ZESTRIL) 10 MG tablet TAKE 1 TABLET BY MOUTH EVERY DAY 05/26/17  Yes Burns, Claudina Lick, MD  Multiple Vitamins-Minerals (MULTIVITAMIN WITH MINERALS) tablet Take 1 tablet by mouth daily.   Yes [provider]  nicotine (NICODERM CQ  - DOSED IN MG/24 HOURS) 14 mg/24hr patch Place 1 patch (14 mg total) onto the skin daily. Patient taking differently: Place 14 mg onto the skin daily as needed (ONLY IF SMOKING).  11/02/17  Yes Lavina Hamman, MD  pantoprazole (PROTONIX) 40 MG tablet Take 1 tablet (40 mg total) by mouth daily with lunch. 12/22/17  Yes Burns, Claudina Lick, MD  pregabalin (LYRICA) 150 MG capsule Take 150-300 mg by mouth See admin instructions. Take 150 mg by mouth in the morning and 300 mg at bedtime   Yes [provider]  Propylene Glycol (SYSTANE BALANCE OP) Place 1-2 drops into both eyes daily.   Yes [provider]  rosuvastatin (CRESTOR) 20 MG tablet Take 1 tablet (20 mg total) by mouth daily. Patient taking differently: Take 20 mg by mouth at bedtime.  07/18/17  Yes Burns, Claudina Lick, MD  traMADol (ULTRAM) 50 MG tablet Take 1 tablet (50 mg total) by mouth every 4 (four) hours as needed. Patient taking differently: Take 50 mg by mouth every 4 (four) hours as needed (for pain).  06/10/17  Yes Jaffe, Adam R, DO  azelastine (ASTELIN) 0.1 % nasal spray Place 2 sprays into both nostrils at bedtime as needed for rhinitis. Use in each nostril as directed Patient not taking: Reported on 03/27/2018 08/28/15   Colon Branch, MD  pregabalin (LYRICA) 25 MG capsule Take 1 capsule (25 mg total) by mouth daily. Patient not taking: Reported on 03/27/2018 11/02/17   Lavina Hamman, MD    Family History Family History  Problem Relation Age of Onset  . Heart attack Mother 57  . Heart disease Mother   . Stroke Mother   . Hyperlipidemia Mother   . Hypertension Mother   . COPD Father   . Peripheral vascular disease Father   . Diabetes Brother   . Heart disease Brother   . Hypertension Brother   . Heart attack Brother   . Diabetes Daughter   . Colon cancer Neg Hx   . Prostate cancer Neg Hx     Social History Social History   Tobacco Use  . Smoking status: Former Smoker    Years: 50.00    Last attempt to quit:  07/22/2012    Years since quitting: 5.6  . Smokeless tobacco: Current User  . Tobacco comment: pt states that he is using E-cigs  Substance Use Topics  . Alcohol use: No  Alcohol/week: 0.0 standard drinks  . Drug use: No     Allergies   Bee venom; Beta adrenergic blockers; Carvedilol; Influenza vaccines; Atorvastatin; and Latex   Review of Systems Review of Systems  Unable to perform ROS: Mental status change  Psychiatric/Behavioral: Positive for confusion.     Physical Exam Updated Vital Signs BP 102/63   Pulse 62   Temp 99.1 F (37.3 C) (Oral)   Resp (!) 22   Ht 5\' 11"  (1.803 m)   Wt 65.8 kg Comment: per patient  SpO2 97%   BMI 20.22 kg/m   Physical Exam  Constitutional: He appears well-developed and well-nourished.  HENT:  Head: Normocephalic and atraumatic.  Eyes: Conjunctivae are normal.  Neck: Neck supple.  Cardiovascular: Regular rhythm. Tachycardia present.  No murmur heard. Pulmonary/Chest: Effort normal and breath sounds normal. Tachypnea noted. No respiratory distress.  Abdominal: Soft. There is tenderness (suprapubic).  Musculoskeletal: He exhibits no edema.  Bilateral AKAs, severe scoliosis  Neurological: He is disoriented.  Skin: Skin is warm and dry.  Psychiatric: He has a normal mood and affect.  Nursing note and vitals reviewed.    ED Treatments / Results  Labs (all labs ordered are listed, but only abnormal results are displayed) Labs Reviewed  COMPREHENSIVE METABOLIC PANEL - Abnormal; Notable for the following components:      Result Value   CO2 18 (*)    Glucose, Bld 232 (*)    Calcium 8.8 (*)    Albumin 3.3 (*)    AST 276 (*)    ALT 259 (*)    Alkaline Phosphatase 340 (*)    Total Bilirubin 3.7 (*)    All other components within normal limits  CBC WITH DIFFERENTIAL/PLATELET - Abnormal; Notable for the following components:   WBC 12.3 (*)    RDW 17.2 (*)    Neutro Abs 11.0 (*)    Lymphs Abs 0.6 (*)    All other components  within normal limits  URINALYSIS, ROUTINE W REFLEX MICROSCOPIC - Abnormal; Notable for the following components:   Color, Urine AMBER (*)    Hgb urine dipstick LARGE (*)    Ketones, ur 5 (*)    Nitrite POSITIVE (*)    Leukocytes, UA SMALL (*)    RBC / HPF >50 (*)    Bacteria, UA MANY (*)    All other components within normal limits  I-STAT CG4 LACTIC ACID, ED - Abnormal; Notable for the following components:   Lactic Acid, Venous 2.87 (*)    All other components within normal limits  I-STAT CG4 LACTIC ACID, ED - Abnormal; Notable for the following components:   Lactic Acid, Venous 2.90 (*)    All other components within normal limits  I-STAT TROPONIN, ED - Abnormal; Notable for the following components:   Troponin i, poc 0.10 (*)    All other components within normal limits  CULTURE, BLOOD (ROUTINE X 2)  CULTURE, BLOOD (ROUTINE X 2)  URINE CULTURE  HEPARIN LEVEL (UNFRACTIONATED)  I-STAT TROPONIN, ED    EKG EKG Interpretation  Date/Time:  Friday March 27 2018 18:48:02 EDT Ventricular Rate:  108 PR Interval:    QRS Duration: 137 QT Interval:  361 QTC Calculation: 484 R Axis:   -67 Text Interpretation:  Normal sinus rhythm Non-specific intra-ventricular conduction delay st elevation III, AVF Confirmed by Pattricia Boss [id number]) on 03/27/2018 6:53:57 PM   Radiology Dg Chest Port 1 View  Result Date: 03/27/2018 CLINICAL DATA:  Altered level of consciousness.  Fevers.  Sepsis. EXAM: PORTABLE CHEST 1 VIEW COMPARISON:  One-view chest x-ray 10/30/2017. FINDINGS: The heart is enlarged. Asymmetric left lower lobe airspace disease is present. No definite effusions are present. The right lung is clear. Atherosclerotic calcifications are present at the aortic arch. The visualized soft tissues and bony thorax are unremarkable. IMPRESSION: 1. Asymmetric left lower lobe airspace disease is concerning for pneumonia. 2. Aortic atherosclerosis. Electronically Signed   By: San Morelle  M.D.   On: 03/27/2018 20:34    Procedures Procedures (including critical care time)  Medications Ordered in ED Medications  metroNIDAZOLE (FLAGYL) IVPB 500 mg (0 mg Intravenous Stopped 03/27/18 2056)  vancomycin (VANCOCIN) IVPB 1000 mg/200 mL premix (has no administration in time range)  ceFEPIme (MAXIPIME) 2 g in sodium chloride 0.9 % 100 mL IVPB (has no administration in time range)  heparin ADULT infusion 100 units/mL (25000 units/26mL sodium chloride 0.45%) (800 Units/hr Intravenous Transfusing/Transfer 03/27/18 2345)  lactated ringers bolus 1,000 mL (0 mLs Intravenous Stopped 03/27/18 2014)    And  lactated ringers bolus 500 mL (0 mLs Intravenous Stopped 03/27/18 2104)    And  lactated ringers bolus 250 mL (0 mLs Intravenous Stopped 03/27/18 2104)  ceFEPIme (MAXIPIME) 2 g in sodium chloride 0.9 % 100 mL IVPB (0 g Intravenous Stopped 03/27/18 2015)  vancomycin (VANCOCIN) IVPB 1000 mg/200 mL premix (0 mg Intravenous Stopped 03/27/18 2116)  aspirin chewable tablet 324 mg (324 mg Oral Given 03/27/18 2333)  heparin bolus via infusion 4,000 Units (4,000 Units Intravenous Bolus from Bag 03/27/18 2344)     Initial Impression / Assessment and Plan / ED Course  I have reviewed the triage vital signs and the nursing notes.  Pertinent labs & imaging results that were available during my care of the patient were reviewed by me and considered in my medical decision making (see chart for details).     Patient is a 77 year old male with past medical history of scoliosis, hypertension and diabetes who presents with altered mental status.  He reports that he is got suprapubic abdominal pain and denies shortness of breath, headaches, vision changes, neck pains, chest pain.  Patient has delirium and waxing and waning mental status.  Patient is tachycardic and hypotensive on arrival.  He had 101 F fever with EMS and was given a gram of Tylenol prior to arrival.  Code sepsis called and patient given 30 cc/kg IV  fluids, cefepime, metronidazole, and vancomycin.  The patient has improved mental status, tachycardia, tachypnea.  CBC reveals mild leukocytosis no significant anemia.  CMP reveals elevated LFTs and glucose.  Alysis shows findings concerning for cystitis.  Acid elevated at 2.9.  Troponin trending up from 0.08-0.1.  Cardiology consulted and recommended aspirin and heparin.  Patient given aspirin and heparin here.  Admitted to internal medicine.  Patient care supervised by Dr. Jeanell Sparrow.  Irven Baltimore, MD  Final Clinical Impressions(s) / ED Diagnoses   Final diagnoses:  Sepsis [facility])    ED Discharge Orders    None       Irven Baltimore, MD 03/28/18 [id number]    Pattricia Boss, MD 03/30/18 2126    Pattricia Boss, MD 04/23/18 1436

## 2018-03-27 NOTE — ED Notes (Signed)
Dr Jeanell Sparrow informed of troponin results .10

## 2018-03-27 NOTE — Consult Note (Signed)
Cardiology Consult    Patient ID: Jaime [name] MRN: [id number], DOB/AGE: Dec 27, 1940   Admit date: 03/27/2018 Date of Consult: 03/27/2018  Primary Physician: [name], MD Primary Cardiologist: [name], MD  Patient Profile    Jaime [name] is a 77 y.o. male with a history of multiple sclerosis, hypertension, CHF, CAD status post DES, pulmonary attention, who is being seen today for the evaluation of NSTEMI in the setting of sepsis  Past Medical History   Past Medical History:  Diagnosis Date  . Atherosclerotic PVD with ulceration ([place])    left foot  . CAD (coronary artery disease) CARDIOLOGIST- DR KATZ-- VISIT 06-05-2011 IN [facility]   non-STEMI, 2007.Marland Kitchenoccluded circumflex.. Taxus stent placed...residual 80% LAD...50% RCA  . Cardiomyopathy, ischemic    EF 45% per ECHO 2008  //   EF 25%, echo, August, 2013 //  Echo (8/15):  Mild LVH, EF 30-35%, ant-lat and lat AK, inf HK, Gr 1 DD, mild MR, mild LAE  . Carotid artery disease ([place])    a.  Doppler, February, 2012, 0-39% bilateral,Mild smooth plaque;  b.  Carotid US (8/15):  Bilateral 1-39% ICA >>> F/u 2 years  . Chronic indwelling Foley catheter   . Constipation   . Dementia   . Diabetes mellitus    INSULIN-DEPENDent  . Fatigue SEVERE  . H/O pleural effusion 2008   POST THORACENTESIS  . History of colon polyps PRECANCEROUS  . Hyperlipidemia   . Hypertension   . Impotence   . Increased prostate specific antigen (PSA) velocity   . Insomnia   . Lung nodule    resolved 11-2006 CT Chest  . Multiple sclerosis ([place]) [place] -- LAST VISIT 11-20-2010  NOTE W/ CHART  . PAC (premature atrial contraction)    December, 2013  . Pulmonary hypertension ([place])    moderate ECHO Jan 2008  . Scoliosis associated with other condition   . Sleep apnea   . Systolic heart failure   . TIA (transient ischemic attack)   . Tobacco abuse    quit   . Urinary retention    dx ~ 2-12, like from [place], now with a  catheter, saw urology  . Urinary tract infection    hx of    Past Surgical History:  Procedure Laterality Date  . ABDOMINAL ANGIOGRAM  12/17/2011   Procedure: ABDOMINAL ANGIOGRAM;  Surgeon: Serafina Mitchell, MD;  Location: [facility] CATH LAB;  Service: Cardiovascular;;  . ABDOMINAL AORTAGRAM N/A 08/19/2013   Procedure: ABDOMINAL Maxcine Ham;  Surgeon: Conrad Hartman, MD;  Location: [facility] CATH LAB;  Service: Cardiovascular;  Laterality: N/A;  . AMPUTATION  03/17/2012   Procedure: AMPUTATION ABOVE KNEE;  Surgeon: Conrad Strasburg, MD;  Location: [place];  Service: Vascular;  Laterality: Left;  . AMPUTATION  06/03/2012   Procedure: AMPUTATION ABOVE KNEE;  Surgeon: Conrad Hiouchi, MD;  Location: [place];  Service: Vascular;  Laterality: Right;  . CHOLECYSTECTOMY N/A 10/25/2014   Procedure: LAPAROSCOPIC CHOLECYSTECTOMY ;  Surgeon: Coralie Keens, MD;  Location: [place];  Service: General;  Laterality: N/A;  . CORONARY ANGIOPLASTY WITH STENT PLACEMENT  05-01-2006   OCCLUDED CIRCUMFLEX -- TAXUS STENT PLACMENT  AND RESIDUAL 80% LAD,  50% RCA  . CYSTOSCOPY  07/30/2011   Procedure: CYSTOSCOPY;  Surgeon: Hanley Ben, MD;  Location: [facility];  Service: Urology;  Laterality: N/A;  . ERCP N/A 08/25/2014   Procedure: ENDOSCOPIC RETROGRADE CHOLANGIOPANCREATOGRAPHY (ERCP);  Surgeon: Norberto Sorenson  Sindy Guadeloupe, MD;  Location: [facility] ENDOSCOPY;  Service: Endoscopy;  Laterality: N/A;  . FEMORAL-POPLITEAL BYPASS GRAFT  03/11/2012   Procedure: BYPASS GRAFT FEMORAL-POPLITEAL ARTERY;  Surgeon: Conrad [place] Grove, MD;  Location: [place];  Service: Vascular;  Laterality: Left;  embolectomy left lower leg  . FEMORAL-TIBIAL BYPASS GRAFT  03/11/2012   Procedure: BYPASS GRAFT FEMORAL-TIBIAL ARTERY;  Surgeon: Conrad Cobb, MD;  Location: [facility] OR;  Service: Vascular;  Laterality: Left;  Left Femoral -Tibial trunk bypass, Endarterectomy of Tibial- Peroneal trunk with vein angioplasty.  Marland Kitchen HERNIA REPAIR  1990   (R)  . INTRAOPERATIVE ARTERIOGRAM  03/11/2012    Procedure: INTRA OPERATIVE ARTERIOGRAM;  Surgeon: Conrad [place]ils, MD;  Location: [place];  Service: Vascular;  Laterality: Left;  . LOWER EXTREMITY ANGIOGRAM Bilateral 12/17/2011   Procedure: LOWER EXTREMITY ANGIOGRAM;  Surgeon: Serafina Mitchell, MD;  Location: [facility] CATH LAB;  Service: Cardiovascular;  Laterality: Bilateral;  bil lower extrem angio  . THORACENTESIS  2008   PLEURAL EFFUSION  . TRANSURETHRAL RESECTION OF PROSTATE  07/30/2011   Procedure: TRANSURETHRAL RESECTION OF THE PROSTATE (TURP);  Surgeon: Hanley Ben, MD;  Location[facility];  Service: Urology;  Laterality: N/A;     Allergies  Allergies  Allergen Reactions  . Bee Venom Anaphylaxis  . Beta Adrenergic Blockers Other (See Comments)    Bradycardia with Carvedilol   . Carvedilol Other (See Comments)    Bradycardia with all Beta blockers   . Influenza Vaccines Other (See Comments)    Made patient "sick for months"  . Atorvastatin Other (See Comments)    Severe pain in stumps  . Latex Rash    History of Present Illness    Patient presents to the emergency room from home in presence of his wife with altered mental status.  Found to be septic.  Suspected source is urinary tract and pneumonia.  With fluid resuscitation and antibiotics is mental status rapidly improved.  His vital signs improved as well.  He was as low 70s with his blood pressure.  In that setting despite lack of cardiac symptoms troponin was drawn and his troponin was increasing from 0.08 --> 0.1.  Lactate around 3.  Denies chest pain, shortness of breath, PND, lower extremity edema.  Patient is bedbound.  Patient has known LVEF of 30 to 35%.  Last evaluation was in April.  His last PCI was in 2007 with a stent to the circumflex.  Known Fan tip bypass in 2013.  Presentation in April of this year.  Inpatient Medications      Family History    Family History  Problem Relation Age of Onset  . Heart attack Mother 62  . Heart disease  Mother   . Stroke Mother   . Hyperlipidemia Mother   . Hypertension Mother   . COPD Father   . Peripheral vascular disease Father   . Diabetes Brother   . Heart disease Brother   . Hypertension Brother   . Heart attack Brother   . Diabetes Daughter   . Colon cancer Neg Hx   . Prostate cancer Neg Hx    He indicated that his mother is deceased. He indicated that his father is deceased. He indicated that the status of his brother is unknown. He indicated that his maternal grandmother is deceased. He indicated that his maternal grandfather is deceased. He indicated that his paternal grandmother is deceased. He indicated that his paternal grandfather is deceased. He indicated that the status  of his daughter is unknown. He indicated that the status of his neg hx is unknown.   Social History    Social History   Socioeconomic History  . Marital status: Married    Spouse name: Not on file  . Number of children: 2  . Years of education: Not on file  . Highest education level: Not on file  Occupational History  . Occupation: disable    Employer: RETIRED  Social Needs  . Financial resource strain: Not on file  . Food insecurity:    Worry: Not on file    Inability: Not on file  . Transportation needs:    Medical: Not on file    Non-medical: Not on file  Tobacco Use  . Smoking status: Former Smoker    Years: 50.00    Last attempt to quit: 07/22/2012    Years since quitting: 5.6  . Smokeless tobacco: Current User  . Tobacco comment: pt states that he is using E-cigs  Substance and Sexual Activity  . Alcohol use: No    Alcohol/week: 0.0 standard drinks  . Drug use: No  . Sexual activity: Never    Comment: electronic cigarettes no nicotene  Lifestyle  . Physical activity:    Days per week: Not on file    Minutes per session: Not on file  . Stress: Not on file  Relationships  . Social connections:    Talks on phone: Not on file    Gets together: Not on file    Attends  religious service: Not on file    Active member of club or organization: Not on file    Attends meetings of clubs or organizations: Not on file    Relationship status: Not on file  . Intimate partner violence:    Fear of current or ex partner: Not on file    Emotionally abused: Not on file    Physically abused: Not on file    Forced sexual activity: Not on file  Other Topics Concern  . Not on file  Social History Narrative   Lives w/ wife     Review of Systems    General:  No chills, fever, night sweats or weight changes.  Cardiovascular:  No chest pain, dyspnea on exertion, edema, orthopnea, palpitations, paroxysmal nocturnal dyspnea. Dermatological: No rash, lesions/masses Respiratory: No cough, dyspnea Urologic: No hematuria, dysuria Abdominal:   No nausea, vomiting, diarrhea, bright red blood per rectum, melena, or hematemesis Neurologic:  No visual changes, wkns, changes in mental status. All other systems reviewed and are otherwise negative except as noted above.  Physical Exam    Blood pressure 102/63, pulse 62, temperature 99.1 F (37.3 C), temperature source Oral, resp. rate (!) 22, height 5\' 11"  (1.803 m), weight 65.8 kg, SpO2 97 %.  General: Pleasant, NAD Psych: Normal affect. Neuro: Weak in all 4 extremities HEENT: Normal  Neck: Supple without bruits or JVD. Lungs:  Resp regular and unlabored, CTA. Heart: RRR no s3, s4, or murmurs. Abdomen: Soft, non-tender, non-distended, BS + x 4.  Extremities: Bilateral amputee Labs    Troponin [facility] of Care Test) Recent Labs    03/27/18 2217  TROPIPOC 0.10*   No results for input(s): CKTOTAL, CKMB, TROPONINI in the last 72 hours. Lab Results  Component Value Date   WBC 12.3 (H) 03/27/2018   HGB 13.9 03/27/2018   HCT 43.1 03/27/2018   MCV 80.9 03/27/2018   PLT 190 03/27/2018    Recent Labs  Lab 03/27/18 1923  NA 137  K 3.7  CL 106  CO2 18*  BUN 8  CREATININE 0.71  CALCIUM 8.8*  PROT 7.2  BILITOT 3.7*    ALKPHOS 340*  ALT 259*  AST 276*  GLUCOSE 232*   Lab Results  Component Value Date   CHOL 110 11/25/2017   HDL 32.60 (L) 11/25/2017   LDLCALC 64 11/25/2017   TRIG 68.0 11/25/2017   Lab Results  Component Value Date   DDIMER 2.50 (H) 12/11/2011     Radiology Studies    Dg Chest Port 1 View  Result Date: 03/27/2018 CLINICAL DATA:  Altered level of consciousness.  Fevers.  Sepsis. EXAM: PORTABLE CHEST 1 VIEW COMPARISON:  One-view chest x-ray 10/30/2017. FINDINGS: The heart is enlarged. Asymmetric left lower lobe airspace disease is present. No definite effusions are present. The right lung is clear. Atherosclerotic calcifications are present at the aortic arch. The visualized soft tissues and bony thorax are unremarkable. IMPRESSION: 1. Asymmetric left lower lobe airspace disease is concerning for pneumonia. 2. Aortic atherosclerosis. Electronically Signed   By: San Morelle M.D.   On: 03/27/2018 20:34    ECG & Cardiac Imaging    Sinus tach with globally abnormal ST changes.  ST elevations in 2 3 and aVR.  ST depressions in lateral leads.  Changes are not significantly different from prior ECGs.  Assessment & Plan    NSTEMI: Likely type II MI in the setting of sepsis.  She is asymptomatic.  Can consider repeat functional assessment of heart and evaluation of wall motion normalities on echo.  Can consider medical management of his NSTEMI at this time point using heparin.  Continue on aspirin and statin.    Signed, Cristina Gong, MD 03/27/2018, 11:57 PM  For questions or updates, please contact   Please consult [email or web address] for contact info under Cardiology/STEMI.

## 2018-03-27 NOTE — Progress Notes (Signed)
ANTICOAGULATION CONSULT NOTE - Initial Consult  Pharmacy Consult for heparin Indication: chest pain/ACS  Allergies  Allergen Reactions  . Bee Venom Anaphylaxis  . Beta Adrenergic Blockers Other (See Comments)    Bradycardia with Carvedilol   . Carvedilol Other (See Comments)    Bradycardia with all Beta blockers   . Influenza Vaccines Other (See Comments)    Made patient "sick for months"  . Atorvastatin Other (See Comments)    Severe pain in stumps  . Latex Rash    Patient Measurements: Height: 5\' 11"  (180.3 cm) Weight: 145 lb (65.8 kg)(per patient) IBW/kg (Calculated) : 75.3 Heparin Dosing Weight: 65.8 kg  Vital Signs: Temp: 98.8 F (37.1 C) (09/06 2043) Temp Source: Oral (09/06 2043) BP: 105/70 (09/06 2303) Pulse Rate: 80 (09/06 2303)  Labs: Recent Labs    03/27/18 1923  HGB 13.9  HCT 43.1  PLT 190  CREATININE 0.71    Estimated Creatinine Clearance: 73.1 mL/min (by C-G formula based on SCr of 0.71 mg/dL).   Medical History: Past Medical History:  Diagnosis Date  . Atherosclerotic PVD with ulceration ([place])    left foot  . CAD (coronary artery disease) CARDIOLOGIST- DR KATZ-- VISIT 06-05-2011 IN [facility]   non-STEMI, 2007.Marland Kitchenoccluded circumflex.. Taxus stent placed...residual 80% LAD...50% RCA  . Cardiomyopathy, ischemic    EF 45% per ECHO 2008  //   EF 25%, echo, August, 2013 //  Echo (8/15):  Mild LVH, EF 30-35%, ant-lat and lat AK, inf HK, Gr 1 DD, mild MR, mild LAE  . Carotid artery disease ([place])    a.  Doppler, February, 2012, 0-39% bilateral,Mild smooth plaque;  b.  Carotid US (8/15):  Bilateral 1-39% ICA >>> F/u 2 years  . Chronic indwelling Foley catheter   . Constipation   . Dementia   . Diabetes mellitus    INSULIN-DEPENDent  . Fatigue SEVERE  . H/O pleural effusion 2008   POST THORACENTESIS  . History of colon polyps PRECANCEROUS  . Hyperlipidemia   . Hypertension   . Impotence   . Increased prostate specific antigen (PSA) velocity   .  Insomnia   . Lung nodule    resolved 11-2006 CT Chest  . Multiple sclerosis ([place]) [place] -- LAST VISIT 11-20-2010  NOTE W/ CHART  . PAC (premature atrial contraction)    December, 2013  . Pulmonary hypertension ([place])    moderate ECHO Jan 2008  . Scoliosis associated with other condition   . Sleep apnea   . Systolic heart failure   . TIA (transient ischemic attack)   . Tobacco abuse    quit   . Urinary retention    dx ~ 2-12, like from [place], now with a catheter, saw urology  . Urinary tract infection    hx of    Medications:  See medication history  Assessment: 77 yo man to start heparin for CP.  He was not on anticoagulation PTA Goal of Therapy:  Heparin level 0.3-0.7 units/ml Monitor platelets by anticoagulation protocol: Yes   Plan:  Heparin 4000 unit bolus and drip at 800 units/hr Check heparin level in 6-8 hours Daily HL and CBC while on heparin Monitor for bleeding complications  Lauralee Evener, Katelynn Heidler [place] 03/27/2018,11:25 PM

## 2018-03-27 NOTE — ED Triage Notes (Signed)
Pt here from home with c/o aloc and pain along with fevers , pt received 1000 mg of tylenol  By ems

## 2018-03-27 NOTE — Progress Notes (Signed)
Received report from ER 

## 2018-03-27 NOTE — ED Notes (Signed)
Dr Jeanell Sparrow informed of lactic acid results 2.90

## 2018-03-27 NOTE — H&P (Addendum)
TRH H&P   Patient Demographics:    Jaime [name], is a 77 y.o. male  MRN: [id number]   DOB - [birth year]  Admit Date - 03/27/2018  Outpatient Primary MD for the patient is Quay Burow, Claudina Lick, MD  Referring MD/NP/PA: Pattricia Boss  Outpatient Specialists:    Patient coming from: home  Chief Complaint  Patient presents with  . Blood Infection      HPI:    Jaime [name]  is a 77 y.o. male, w multiple sclerosis, hypertension, hyperlipidemia, dm2,  CAD s/p DES, PVD, pulmonary hypertension, CHF (EF 30-35%), apparently presents with c/o dysuria and fever and slight dry cough. Pt notes nausea, but no emesis, and slight epigastric discomfort.  Pt denies diarrhea, brbpr, black stool.   In ED,  T 98.8, P 56-109, 80 currently, bp 71/57 -105/70  Pox 86-100%  Na 137, k 3.7, Bun 8, Creatinine 0.71 Alb 3.3 Glucose 232 Ast 276, Alt 259, Alk phos 340, T. Bili 3.7 Wbc 12.3, hgb 13.9, Plt 190 Trop 0.08 => 0.10  Lactic acid 2.87 => 2.90  CXR IMPRESSION: 1. Asymmetric left lower lobe airspace disease is concerning for pneumonia. 2. Aortic atherosclerosis.  Pt will be admitted for w/up of sepsis secondary to UTI, or pneumonia along with abnormal liver function , and elevation in troponin w ? STEMI          Review of systems:    In addition to the HPI above,  No Fever-chills, No Headache, No changes with Vision or hearing, No problems swallowing food or Liquids, No Chest pain, Cough or Shortness of Breath, No Abdominal pain, No Nausea or Vommitting, Bowel movements are regular, No Blood in stool or Urine, No dysuria, No new skin rashes or bruises, No new joints pains-aches,  No new weakness, tingling, numbness in any extremity, No recent weight gain or loss, No polyuria, polydypsia or polyphagia, No significant Mental Stressors.  A full 10 point Review of Systems was done,  except as stated above, all other Review of Systems were negative.   With Past History of the following :    Past Medical History:  Diagnosis Date  . Atherosclerotic PVD with ulceration ([place])    left foot  . CAD (coronary artery disease) CARDIOLOGIST- DR KATZ-- VISIT 06-05-2011 IN [facility]   non-STEMI, 2007.Marland Kitchenoccluded circumflex.. Taxus stent placed...residual 80% LAD...50% RCA  . Cardiomyopathy, ischemic    EF 45% per ECHO 2008  //   EF 25%, echo, August, 2013 //  Echo (8/15):  Mild LVH, EF 30-35%, ant-lat and lat AK, inf HK, Gr 1 DD, mild MR, mild LAE  . Carotid artery disease ([place])    a.  Doppler, February, 2012, 0-39% bilateral,Mild smooth plaque;  b.  Carotid US (8/15):  Bilateral 1-39% ICA >>> F/u 2 years  . Chronic indwelling Foley catheter   . Constipation   . Dementia   .  Diabetes mellitus    INSULIN-DEPENDent  . Fatigue SEVERE  . H/O pleural effusion 2008   POST THORACENTESIS  . History of colon polyps PRECANCEROUS  . Hyperlipidemia   . Hypertension   . Impotence   . Increased prostate specific antigen (PSA) velocity   . Insomnia   . Lung nodule    resolved 11-2006 CT Chest  . Multiple sclerosis ([place]) [place] -- LAST VISIT 11-20-2010  NOTE W/ CHART  . PAC (premature atrial contraction)    December, 2013  . Pulmonary hypertension ([place])    moderate ECHO Jan 2008  . Scoliosis associated with other condition   . Sleep apnea   . Systolic heart failure   . TIA (transient ischemic attack)   . Tobacco abuse    quit   . Urinary retention    dx ~ 2-12, like from [place], now with a catheter, saw urology  . Urinary tract infection    hx of      Past Surgical History:  Procedure Laterality Date  . ABDOMINAL ANGIOGRAM  12/17/2011   Procedure: ABDOMINAL ANGIOGRAM;  Surgeon: Serafina Mitchell, MD;  Location: [facility] CATH LAB;  Service: Cardiovascular;;  . ABDOMINAL AORTAGRAM N/A 08/19/2013   Procedure: ABDOMINAL Maxcine Ham;  Surgeon: Conrad [place], MD;   Location: [facility] CATH LAB;  Service: Cardiovascular;  Laterality: N/A;  . AMPUTATION  03/17/2012   Procedure: AMPUTATION ABOVE KNEE;  Surgeon: Conrad [place]rtlewood, MD;  Location: [place];  Service: Vascular;  Laterality: Left;  . AMPUTATION  06/03/2012   Procedure: AMPUTATION ABOVE KNEE;  Surgeon: Conrad Mounds, MD;  Location: [place];  Service: Vascular;  Laterality: Right;  . CHOLECYSTECTOMY N/A 10/25/2014   Procedure: LAPAROSCOPIC CHOLECYSTECTOMY ;  Surgeon: Coralie Keens, MD;  Location: [place];  Service: General;  Laterality: N/A;  . CORONARY ANGIOPLASTY WITH STENT PLACEMENT  05-01-2006   OCCLUDED CIRCUMFLEX -- TAXUS STENT PLACMENT  AND RESIDUAL 80% LAD,  50% RCA  . CYSTOSCOPY  07/30/2011   Procedure: CYSTOSCOPY;  Surgeon: Hanley Ben, MD;  Location: [facility];  Service: Urology;  Laterality: N/A;  . ERCP N/A 08/25/2014   Procedure: ENDOSCOPIC RETROGRADE CHOLANGIOPANCREATOGRAPHY (ERCP);  Surgeon: Ladene Artist, MD;  Location: [facility] ENDOSCOPY;  Service: Endoscopy;  Laterality: N/A;  . FEMORAL-POPLITEAL BYPASS GRAFT  03/11/2012   Procedure: BYPASS GRAFT FEMORAL-POPLITEAL ARTERY;  Surgeon: Conrad [name], MD;  Location: [place];  Service: Vascular;  Laterality: Left;  embolectomy left lower leg  . FEMORAL-TIBIAL BYPASS GRAFT  03/11/2012   Procedure: BYPASS GRAFT FEMORAL-TIBIAL ARTERY;  Surgeon: Conrad Epping, MD;  Location[facility] OR;  Service: Vascular;  Laterality: Left;  Left Femoral -Tibial trunk bypass, Endarterectomy of Tibial- Peroneal trunk with vein angioplasty.  Marland Kitchen HERNIA REPAIR  1990   (R)  . INTRAOPERATIVE ARTERIOGRAM  03/11/2012   Procedure: INTRA OPERATIVE ARTERIOGRAM;  Surgeon: Conrad [place], MD;  Location: [place];  Service: Vascular;  Laterality: Left;  . LOWER EXTREMITY ANGIOGRAM Bilateral 12/17/2011   Procedure: LOWER EXTREMITY ANGIOGRAM;  Surgeon: Serafina Mitchell, MD;  Location: [facility] CATH LAB;  Service: Cardiovascular;  Laterality: Bilateral;  bil lower extrem angio  . THORACENTESIS  2008    PLEURAL EFFUSION  . TRANSURETHRAL RESECTION OF PROSTATE  07/30/2011   Procedure: TRANSURETHRAL RESECTION OF THE PROSTATE (TURP);  Surgeon: Hanley Ben, MD;  Location[facility];  Service: Urology;  Laterality: N/A;      Social History:  Social History   Tobacco Use  . Smoking status: Former Smoker    Years: 50.00    Last attempt to quit: 07/22/2012    Years since quitting: 5.6  . Smokeless tobacco: Current User  . Tobacco comment: pt states that he is using E-cigs  Substance Use Topics  . Alcohol use: No    Alcohol/week: 0.0 standard drinks     Lives - at home  Mobility - unable to walk   Family History :     Family History  Problem Relation Age of Onset  . Heart attack Mother 65  . Heart disease Mother   . Stroke Mother   . Hyperlipidemia Mother   . Hypertension Mother   . COPD Father   . Peripheral vascular disease Father   . Diabetes Brother   . Heart disease Brother   . Hypertension Brother   . Heart attack Brother   . Diabetes Daughter   . Colon cancer Neg Hx   . Prostate cancer Neg Hx        Home Medications:   Prior to Admission medications   Medication Sig Start Date End Date Taking? Authorizing Provider  albuterol (PROVENTIL HFA;VENTOLIN HFA) 108 (90 Base) MCG/ACT inhaler Inhale 2 puffs into the lungs every 6 (six) hours as needed for wheezing or shortness of breath. 12/28/15  Yes Dorothy Spark, MD  aspirin EC 81 MG tablet Take 1 tablet (81 mg total) by mouth daily. 07/07/17  Yes Dorothy Spark, MD  Cholecalciferol (VITAMIN D-3) 1000 units CAPS Take 2,000 Units by mouth daily.   Yes [provider]  diclofenac sodium (VOLTAREN) 1 % GEL Apply 1 application topically 2 (two) times daily as needed (FOR PAIN).  03/09/12  Yes Kirsteins, Luanna Salk, MD  furosemide (LASIX) 20 MG tablet Take 1 tablet (20 mg total) by mouth daily as needed for fluid or edema (or shortness of breath). 11/01/17  Yes Lavina Hamman, MD  glucagon  (GLUCAGEN) 1 MG SOLR injection Inject 1 mg into the muscle once as needed for low blood sugar. Reported on 11/16/2015   Yes [provider]  guaiFENesin (ROBITUSSIN) 100 MG/5ML SOLN Take 5 mLs by mouth every 4 (four) hours as needed for cough or to loosen phlegm.   Yes [provider]  insulin aspart protamine - aspart (NOVOLOG 70/30 MIX) (70-30) 100 UNIT/ML FlexPen Inject 0.05 mLs (5 Units total) into the skin 2 (two) times daily. Patient taking differently: Inject 5 Units into the skin 2 (two) times daily as needed (if BGL is >100).  03/12/17  Yes Burns, Claudina Lick, MD  lisinopril (PRINIVIL,ZESTRIL) 10 MG tablet TAKE 1 TABLET BY MOUTH EVERY DAY 05/26/17  Yes Burns, Claudina Lick, MD  Multiple Vitamins-Minerals (MULTIVITAMIN WITH MINERALS) tablet Take 1 tablet by mouth daily.   Yes [provider]  nicotine (NICODERM CQ - DOSED IN MG/24 HOURS) 14 mg/24hr patch Place 1 patch (14 mg total) onto the skin daily. Patient taking differently: Place 14 mg onto the skin daily as needed (ONLY IF SMOKING).  11/02/17  Yes Lavina Hamman, MD  pantoprazole (PROTONIX) 40 MG tablet Take 1 tablet (40 mg total) by mouth daily with lunch. 12/22/17  Yes Burns, Claudina Lick, MD  pregabalin (LYRICA) 150 MG capsule Take 150-300 mg by mouth See admin instructions. Take 150 mg by mouth in the morning and 300 mg at bedtime   Yes [provider]  Propylene Glycol (SYSTANE BALANCE OP) Place 1-2 drops into both  eyes daily.   Yes [provider]  rosuvastatin (CRESTOR) 20 MG tablet Take 1 tablet (20 mg total) by mouth daily. Patient taking differently: Take 20 mg by mouth at bedtime.  07/18/17  Yes Burns, Claudina Lick, MD  traMADol (ULTRAM) 50 MG tablet Take 1 tablet (50 mg total) by mouth every 4 (four) hours as needed. Patient taking differently: Take 50 mg by mouth every 4 (four) hours as needed (for pain).  06/10/17  Yes Jaffe, Adam R, DO  azelastine (ASTELIN) 0.1 % nasal spray Place 2 sprays into both  nostrils at bedtime as needed for rhinitis. Use in each nostril as directed Patient not taking: Reported on 03/27/2018 08/28/15   Colon Branch, MD  pregabalin (LYRICA) 25 MG capsule Take 1 capsule (25 mg total) by mouth daily. Patient not taking: Reported on 03/27/2018 11/02/17   Lavina Hamman, MD     Allergies:     Allergies  Allergen Reactions  . Bee Venom Anaphylaxis  . Beta Adrenergic Blockers Other (See Comments)    Bradycardia with Carvedilol   . Carvedilol Other (See Comments)    Bradycardia with all Beta blockers   . Influenza Vaccines Other (See Comments)    Made patient "sick for months"  . Atorvastatin Other (See Comments)    Severe pain in stumps  . Latex Rash     Physical Exam:   Vitals  Blood pressure 105/70, pulse 80, temperature 98.8 F (37.1 C), temperature source Oral, resp. rate (!) 22, SpO2 96 %.   1. General  lying in bed in NAD,   2. Normal affect and insight, Not Suicidal or Homicidal, Awake Alert, Oriented X 3.  3. No F.N deficits, ALL C.Nerves Intact, Strength 5/5 all 4 extremities, Sensation intact all 4 extremities, Plantars down going.  4. Ears and Eyes appear Normal, Conjunctivae clear, PERRLA. Moist Oral Mucosa.  5. Supple Neck, No JVD, No cervical lymphadenopathy appriciated, No Carotid Bruits.  6. Symmetrical Chest wall movement, Good air movement bilaterally, CTAB.  7. RRR, No Gallops, Rubs or Murmurs, No Parasternal Heave.  8. Positive Bowel Sounds, Abdomen Soft, No tenderness, No organomegaly appriciated,No rebound -guarding or rigidity.  9.  No Cyanosis, Normal Skin Turgor, No Skin Rash or Bruise.  10. Good muscle tone,  joints appear normal , no effusions, Normal ROM.  11. No Palpable Lymph Nodes in Neck or Axillae  + bilateral AKA   Data Review:    CBC Recent Labs  Lab 03/27/18 1923  WBC 12.3*  HGB 13.9  HCT 43.1  PLT 190  MCV 80.9  MCH 26.1  MCHC 32.3  RDW 17.2*  LYMPHSABS 0.6*  MONOABS 0.6  EOSABS 0.0  BASOSABS  0.0   ------------------------------------------------------------------------------------------------------------------  Chemistries  Recent Labs  Lab 03/27/18 1923  NA 137  K 3.7  CL 106  CO2 18*  GLUCOSE 232*  BUN 8  CREATININE 0.71  CALCIUM 8.8*  AST 276*  ALT 259*  ALKPHOS 340*  BILITOT 3.7*   ------------------------------------------------------------------------------------------------------------------ CrCl cannot be calculated (Unknown ideal weight.). ------------------------------------------------------------------------------------------------------------------ No results for input(s): TSH, T4TOTAL, T3FREE, THYROIDAB in the last 72 hours.  Invalid input(s): FREET3  Coagulation profile No results for input(s): INR, PROTIME in the last 168 hours. ------------------------------------------------------------------------------------------------------------------- No results for input(s): DDIMER in the last 72 hours. -------------------------------------------------------------------------------------------------------------------  Cardiac Enzymes No results for input(s): CKMB, TROPONINI, MYOGLOBIN in the last 168 hours.  Invalid input(s): CK ------------------------------------------------------------------------------------------------------------------    Component Value Date/Time   BNP 579.1 (H) 07/01/2016 1734     ---------------------------------------------------------------------------------------------------------------  Urinalysis    Component Value Date/Time   COLORURINE AMBER (A) 03/27/2018 2018   APPEARANCEUR CLEAR 03/27/2018 2018   LABSPEC 1.014 03/27/2018 2018   PHURINE 5.0 03/27/2018 2018   GLUCOSEU NEGATIVE 03/27/2018 2018   GLUCOSEU NEGATIVE 04/29/2017 1211   HGBUR LARGE (A) 03/27/2018 2018   BILIRUBINUR NEGATIVE 03/27/2018 2018   KETONESUR 5 (A) 03/27/2018 2018   PROTEINUR NEGATIVE 03/27/2018 2018   UROBILINOGEN 0.2 04/29/2017 1211    NITRITE POSITIVE (A) 03/27/2018 2018   LEUKOCYTESUR SMALL (A) 03/27/2018 2018    ----------------------------------------------------------------------------------------------------------------   Imaging Results:    Dg Chest Port 1 View  Result Date: 03/27/2018 CLINICAL DATA:  Altered level of consciousness.  Fevers.  Sepsis. EXAM: PORTABLE CHEST 1 VIEW COMPARISON:  One-view chest x-ray 10/30/2017. FINDINGS: The heart is enlarged. Asymmetric left lower lobe airspace disease is present. No definite effusions are present. The right lung is clear. Atherosclerotic calcifications are present at the aortic arch. The visualized soft tissues and bony thorax are unremarkable. IMPRESSION: 1. Asymmetric left lower lobe airspace disease is concerning for pneumonia. 2. Aortic atherosclerosis. Electronically Signed   By: San Morelle M.D.   On: 03/27/2018 20:34    ekg at 110 , LAD, RBBB, st elevation in 2, 3, AVF (cardiology consulted regarding  ? ST elevation)   Assessment & Plan:    Principal Problem:   Pneumonia Active Problems:   Sepsis ([place])   Acute lower UTI   Elevated troponin    Sepsis (tachycardia, leukocytosis, elevated lactic acid, hypotension, hypoxia) Hcap, UTI Blood culture x2 Vanco iv, cefepime iv pharmacy to dose  UTI Await urine culture abx as above  Troponin elevation, evaluation by Cardiology for concerning EKG changes Hypotension Tele Trop iq6hx 3 Check cardiac echo Cardiology consulted by ED, appreciate input Cont aspirin Cont Crestor 77m po qhs Cont Lisinopril 180mpo qday Cont Lasix 2020mo qday Heparin IV while trending next troponin  Dm2 fsbs q4h, ISS  Dm2 neuropathy Cont Lyrica  Gerd Cont PPI  Abnormal liver function ? Shock liver Check acute hepatitis panel  RUQ ultrasound  DVT Prophylaxis heparin - SCDs   AM Labs Ordered, also please review Full Orders  Family Communication: Admission, patients condition and plan of care  including tests being ordered have been discussed with the patient  who indicate understanding and agree with the plan and Code Status.  Code Status  FULL CODE  Likely DC to  home  Condition GUARDED   Consults called:  Cardiology by ED, appreciate input  Admission status: inpatient,, pt will require iv abx for >2 nites for hcap , uti, sepsis, thus requiring inpatient admission while cultures come back, as well as w/up of troponin elevation. w EKG changes   Time spent in minutes : 70   JamJani GravelD on 03/27/2018 at 11:18 PM  Between 7am to 7pm - Pager - [phone] 7pm go to [email or web address] - password [email or web address] - Office  [phone]

## 2018-03-27 NOTE — Progress Notes (Signed)
Pharmacy Antibiotic Note  Jaime [name] is a 77 y.o. male admitted on 03/27/2018 with unknown source of infection.  Pharmacy has been consulted for cefepime and vanocmycin dosing. WBC 12.3. LA 2.87. SCr 0.71. CrCl ~ 90 mL/min  Plan: -Cefepime 2 gm IV 8 hours -Vancomycin 1 gm IV Q 12 hours -Flagyl 500 mg IV Q 8 hours per MD  -Monitor CBC, renal fx, cultures and clinical progress -VT at SS    Temp (24hrs), Avg:100 F (37.8 C), Min:100 F (37.8 C), Max:100 F (37.8 C)  Recent Labs  Lab 03/27/18 1942  LATICACIDVEN 2.87*    CrCl cannot be calculated (Patient's most recent lab result is older than the maximum 21 days allowed.).    Allergies  Allergen Reactions  . Bee Venom Anaphylaxis  . Beta Adrenergic Blockers Other (See Comments)    Bradycardia with carvedilol  . Carvedilol Other (See Comments)    Bradycardia with all Beta blockers   . Influenza Vaccines Other (See Comments)    Sick for months  . Atorvastatin     Severe pain in stumps    Antimicrobials this admission: Cefepime 9/6 >>  Vancomycin 9/6 >>  Flagyl 9/6 >>   Dose adjustments this admission: None   Microbiology results: 9/6 BCx:    Thank you for allowing pharmacy to be a part of this patient's care.  Albertina Parr, PharmD., BCPS Clinical Pharmacist Clinical phone for 03/27/18 until 11pm: [phone]

## 2018-03-27 NOTE — ED Notes (Signed)
Attempted report 

## 2018-03-28 ENCOUNTER — Inpatient Hospital Stay (HOSPITAL_COMMUNITY): Payer: Medicare Other

## 2018-03-28 DIAGNOSIS — R748 Abnormal levels of other serum enzymes: Secondary | ICD-10-CM

## 2018-03-28 LAB — BLOOD CULTURE ID PANEL (REFLEXED)
Acinetobacter baumannii: NOT DETECTED
Acinetobacter baumannii: NOT DETECTED
CANDIDA ALBICANS: NOT DETECTED
CANDIDA GLABRATA: NOT DETECTED
CANDIDA KRUSEI: NOT DETECTED
CANDIDA TROPICALIS: NOT DETECTED
CANDIDA TROPICALIS: NOT DETECTED
Candida albicans: NOT DETECTED
Candida glabrata: NOT DETECTED
Candida krusei: NOT DETECTED
Candida parapsilosis: NOT DETECTED
Candida parapsilosis: NOT DETECTED
Carbapenem resistance: NOT DETECTED
ENTEROBACTER CLOACAE COMPLEX: NOT DETECTED
ENTEROBACTER CLOACAE COMPLEX: NOT DETECTED
ESCHERICHIA COLI: NOT DETECTED
Enterobacteriaceae species: NOT DETECTED
Enterobacteriaceae species: DETECTED — AB
Enterococcus species: DETECTED — AB
Enterococcus species: NOT DETECTED
Escherichia coli: NOT DETECTED
Haemophilus influenzae: NOT DETECTED
Haemophilus influenzae: NOT DETECTED
KLEBSIELLA PNEUMONIAE: NOT DETECTED
Klebsiella oxytoca: NOT DETECTED
Klebsiella oxytoca: DETECTED — AB
Klebsiella pneumoniae: NOT DETECTED
LISTERIA MONOCYTOGENES: NOT DETECTED
Listeria monocytogenes: NOT DETECTED
NEISSERIA MENINGITIDIS: NOT DETECTED
Neisseria meningitidis: NOT DETECTED
PROTEUS SPECIES: NOT DETECTED
PROTEUS SPECIES: NOT DETECTED
Pseudomonas aeruginosa: NOT DETECTED
Pseudomonas aeruginosa: NOT DETECTED
SERRATIA MARCESCENS: NOT DETECTED
STAPHYLOCOCCUS SPECIES: NOT DETECTED
STREPTOCOCCUS AGALACTIAE: NOT DETECTED
STREPTOCOCCUS SPECIES: NOT DETECTED
Serratia marcescens: NOT DETECTED
Staphylococcus aureus (BCID): NOT DETECTED
Staphylococcus aureus (BCID): NOT DETECTED
Staphylococcus species: NOT DETECTED
Streptococcus agalactiae: NOT DETECTED
Streptococcus pneumoniae: NOT DETECTED
Streptococcus pneumoniae: NOT DETECTED
Streptococcus pyogenes: NOT DETECTED
Streptococcus pyogenes: NOT DETECTED
Streptococcus species: NOT DETECTED
Vancomycin resistance: NOT DETECTED

## 2018-03-28 LAB — COMPREHENSIVE METABOLIC PANEL
ALK PHOS: 259 U/L — AB (ref 38–126)
ALT: 196 U/L — ABNORMAL HIGH (ref 0–44)
ANION GAP: 9 (ref 5–15)
AST: 156 U/L — AB (ref 15–41)
Albumin: 2.7 g/dL — ABNORMAL LOW (ref 3.5–5.0)
BUN: 10 mg/dL (ref 8–23)
CALCIUM: 8.3 mg/dL — AB (ref 8.9–10.3)
CHLORIDE: 107 mmol/L (ref 98–111)
CO2: 21 mmol/L — AB (ref 22–32)
Creatinine, Ser: 0.71 mg/dL (ref 0.61–1.24)
GFR calc non Af Amer: >60 mL/min (ref >60)
Glucose, Bld: 137 mg/dL — ABNORMAL HIGH (ref 70–99)
POTASSIUM: 3.7 mmol/L (ref 3.5–5.1)
SODIUM: 137 mmol/L (ref 135–145)
Total Bilirubin: 4.7 mg/dL — ABNORMAL HIGH (ref 0.3–1.2)
Total Protein: 6.3 g/dL — ABNORMAL LOW (ref 6.5–8.1)

## 2018-03-28 LAB — TROPONIN I
TROPONIN I: 0.03 ng/mL — AB (ref <0.03)
Troponin I: 0.05 ng/mL (ref <0.03)
Troponin I: 0.04 ng/mL (ref <0.03)

## 2018-03-28 LAB — HIV ANTIBODY (ROUTINE TESTING W REFLEX): HIV Screen 4th Generation wRfx: NONREACTIVE

## 2018-03-28 LAB — CBC
HCT: 35.7 % — ABNORMAL LOW (ref 39.0–52.0)
Hemoglobin: 11.9 g/dL — ABNORMAL LOW (ref 13.0–17.0)
MCH: 26.3 pg (ref 26.0–34.0)
MCHC: 33.3 g/dL (ref 30.0–36.0)
MCV: 78.8 fL (ref 78.0–100.0)
PLATELETS: 162 10*3/uL (ref 150–400)
RBC: 4.53 MIL/uL (ref 4.22–5.81)
RDW: 16.5 % — AB (ref 11.5–15.5)
WBC: 13 10*3/uL — AB (ref 4.0–10.5)

## 2018-03-28 LAB — CK TOTAL AND CKMB (NOT AT ARMC)
CK, MB: 0.7 ng/mL (ref 0.5–5.0)
Relative Index: INVALID (ref 0.0–2.5)
Total CK: 26 U/L — ABNORMAL LOW (ref 49–397)

## 2018-03-28 LAB — STREP PNEUMONIAE URINARY ANTIGEN: STREP PNEUMO URINARY ANTIGEN: NEGATIVE

## 2018-03-28 LAB — HEPARIN LEVEL (UNFRACTIONATED): Heparin Unfractionated: 0.31 IU/mL (ref 0.30–0.70)

## 2018-03-28 MED ORDER — SODIUM CHLORIDE 0.9 % IV SOLN: 1.0000 g | Freq: Three times a day (TID) | INTRAVENOUS | Status: DC

## 2018-03-28 MED ORDER — ALBUTEROL SULFATE (2.5 MG/3ML) 0.083% IN NEBU: 3.0000 mL | INHALATION_SOLUTION | Freq: Four times a day (QID) | RESPIRATORY_TRACT | Status: DC | PRN

## 2018-03-28 MED ORDER — FUROSEMIDE 20 MG PO TABS: 20.0000 mg | ORAL_TABLET | Freq: Every day | ORAL | Status: DC | PRN

## 2018-03-28 MED ORDER — SODIUM CHLORIDE 0.9 % IV SOLN
2.0000 g | INTRAVENOUS | Status: DC
  Administered 2018-03-28 – 2018-03-29 (×2): 2 g via INTRAVENOUS
  Filled 2018-03-28 (×2): qty 20

## 2018-03-28 MED ORDER — TRAMADOL HCL 50 MG PO TABS
50.0000 mg | ORAL_TABLET | ORAL | Status: DC | PRN
  Administered 2018-03-29 – 2018-04-02 (×5): 50 mg via ORAL
  Filled 2018-03-28 (×5): qty 1

## 2018-03-28 MED ORDER — ENOXAPARIN SODIUM 40 MG/0.4ML ~~LOC~~ SOLN
40.0000 mg | SUBCUTANEOUS | Status: DC
  Administered 2018-03-28 – 2018-04-02 (×6): 40 mg via SUBCUTANEOUS
  Filled 2018-03-28 (×6): qty 0.4

## 2018-03-28 MED ORDER — ADULT MULTIVITAMIN W/MINERALS CH
1.0000 | ORAL_TABLET | Freq: Every day | ORAL | Status: DC
  Administered 2018-03-28 – 2018-04-02 (×6): 1 via ORAL
  Filled 2018-03-28 (×6): qty 1

## 2018-03-28 MED ORDER — PREGABALIN 75 MG PO CAPS
150.0000 mg | ORAL_CAPSULE | Freq: Every day | ORAL | Status: DC
  Administered 2018-03-28 – 2018-04-02 (×6): 150 mg via ORAL
  Filled 2018-03-28 (×6): qty 2

## 2018-03-28 MED ORDER — POLYVINYL ALCOHOL 1.4 % OP SOLN
1.0000 [drp] | Freq: Every day | OPHTHALMIC | Status: DC
  Administered 2018-03-28 – 2018-03-31 (×4): 1 [drp] via OPHTHALMIC
  Administered 2018-04-01: 2 [drp] via OPHTHALMIC
  Administered 2018-04-02: 1 [drp] via OPHTHALMIC
  Filled 2018-03-28: qty 15

## 2018-03-28 MED ORDER — VANCOMYCIN HCL IN DEXTROSE 1-5 GM/200ML-% IV SOLN
1000.0000 mg | Freq: Two times a day (BID) | INTRAVENOUS | Status: DC
  Administered 2018-03-28 – 2018-03-29 (×3): 1000 mg via INTRAVENOUS
  Filled 2018-03-28 (×4): qty 200

## 2018-03-28 MED ORDER — PANTOPRAZOLE SODIUM 40 MG PO TBEC
40.0000 mg | DELAYED_RELEASE_TABLET | Freq: Every day | ORAL | Status: DC
  Administered 2018-03-28 – 2018-04-02 (×6): 40 mg via ORAL
  Filled 2018-03-28 (×6): qty 1

## 2018-03-28 MED ORDER — MORPHINE SULFATE (PF) 2 MG/ML IV SOLN
0.5000 mg | Freq: Once | INTRAVENOUS | Status: AC
  Administered 2018-03-28: 0.5 mg via INTRAVENOUS
  Filled 2018-03-28: qty 1

## 2018-03-28 MED ORDER — ROSUVASTATIN CALCIUM 10 MG PO TABS
20.0000 mg | ORAL_TABLET | Freq: Every day | ORAL | Status: DC
  Administered 2018-03-28 – 2018-04-01 (×6): 20 mg via ORAL
  Filled 2018-03-28 (×7): qty 2

## 2018-03-28 MED ORDER — PREGABALIN 100 MG PO CAPS
300.0000 mg | ORAL_CAPSULE | Freq: Every day | ORAL | Status: DC
  Administered 2018-03-28 – 2018-04-01 (×6): 300 mg via ORAL
  Filled 2018-03-28 (×6): qty 3

## 2018-03-28 MED ORDER — LISINOPRIL 10 MG PO TABS
10.0000 mg | ORAL_TABLET | Freq: Every day | ORAL | Status: DC
  Administered 2018-03-29 – 2018-04-02 (×5): 10 mg via ORAL
  Filled 2018-03-28 (×6): qty 1

## 2018-03-28 MED ORDER — ASPIRIN EC 81 MG PO TBEC
81.0000 mg | DELAYED_RELEASE_TABLET | Freq: Every day | ORAL | Status: DC
  Administered 2018-03-28 – 2018-04-02 (×6): 81 mg via ORAL
  Filled 2018-03-28 (×6): qty 1

## 2018-03-28 MED ORDER — VITAMIN D 1000 UNITS PO TABS
2000.0000 [IU] | ORAL_TABLET | Freq: Every day | ORAL | Status: DC
  Administered 2018-03-28 – 2018-04-02 (×6): 2000 [IU] via ORAL
  Filled 2018-03-28 (×6): qty 2

## 2018-03-28 NOTE — Progress Notes (Signed)
PHARMACY - PHYSICIAN COMMUNICATION CRITICAL VALUE ALERT - BLOOD CULTURE IDENTIFICATION (BCID)  Jaime [name] is an 77 y.o. male who presented to [facility] on 03/27/2018   Assessment:  Blood cultures showing 2/2 klebsiella and 1/2 enterococcus  Name of physician (or Provider) Contacted: Dr. Darrick Meigs  Current antibiotics: Vancomycin/ceftriaxone  Changes to prescribed antibiotics recommended:  Recommendations accepted by provider   The klebsiella resulted first in 4/4 bottles. Later in the day, micro lab called back saying there was enterococcus in 1/4 bottles.   Results for orders placed or performed during the hospital encounter of 03/27/18  Blood Culture ID Panel (Reflexed) (Collected: 03/27/2018  7:00 PM)  Result Value Ref Range   Enterococcus species DETECTED (A) NOT DETECTED   Vancomycin resistance NOT DETECTED NOT DETECTED   Listeria monocytogenes NOT DETECTED NOT DETECTED   Staphylococcus species NOT DETECTED NOT DETECTED   Staphylococcus aureus NOT DETECTED NOT DETECTED   Streptococcus species NOT DETECTED NOT DETECTED   Streptococcus agalactiae NOT DETECTED NOT DETECTED   Streptococcus pneumoniae NOT DETECTED NOT DETECTED   Streptococcus pyogenes NOT DETECTED NOT DETECTED   Acinetobacter baumannii NOT DETECTED NOT DETECTED   Enterobacteriaceae species NOT DETECTED NOT DETECTED   Enterobacter cloacae complex NOT DETECTED NOT DETECTED   Escherichia coli NOT DETECTED NOT DETECTED   Klebsiella oxytoca NOT DETECTED NOT DETECTED   Klebsiella pneumoniae NOT DETECTED NOT DETECTED   Proteus species NOT DETECTED NOT DETECTED   Serratia marcescens NOT DETECTED NOT DETECTED   Haemophilus influenzae NOT DETECTED NOT DETECTED   Neisseria meningitidis NOT DETECTED NOT DETECTED   Pseudomonas aeruginosa NOT DETECTED NOT DETECTED   Candida albicans NOT DETECTED NOT DETECTED   Candida glabrata NOT DETECTED NOT DETECTED   Candida krusei NOT DETECTED NOT DETECTED   Candida parapsilosis NOT  DETECTED NOT DETECTED   Candida tropicalis NOT DETECTED NOT DETECTED    Harvel Quale 03/28/2018  6:43 PM

## 2018-03-28 NOTE — Progress Notes (Signed)
[facility]  PROGRESS NOTE  Jaime [name] HEN:[id number] DOB: February 08, 1941 DOA: 03/27/2018 PCP: [name], MD   Brief HPI:   77 year old male with history of multiple sclerosis, hypertension, hyperlipidemia, type 2 diabetes mellitus, CAD status post DES, PVD, pulmonary hypertension, CHF EF 30 to 35% came with complaints of dysuria and fever.  Patient was started on empiric antibiotics for sepsis due to UTI.  Also had elevated troponin, cardiology was consulted.   Subjective   Patient seen and examined, continues to be pleasantly confused.  Alert and communicating   Assessment/Plan:     1. Klebsiella bacteremia-blood cultures growing Klebsiella.  Urine culture is pending.  Continue ceftriaxone.  Will discontinue IV vancomycin.  2. Sepsis due to UTI-blood culture growing Klebsiella.  Started on ceftriaxone as above.  3. Elevated troponin-cardiology was consulted, no further intervention as per cardiology.  Troponin 0.04, likely from demand ischemia from sepsis.  4. Diabetes mellitus-sliding scale insulin NovoLog.  Blood glucose well controlled.  5. Transaminitis-LFTs are improving, likely from shock liver.  Abdominal ultrasound showed no acute abnormality.  Hepatitis panel is pending     CBG: No results for input(s): GLUCAP in the last 168 hours.  CBC: Recent Labs  Lab 03/27/18 1923 03/28/18 0434  WBC 12.3* 13.0*  NEUTROABS 11.0*  --   HGB 13.9 11.9*  HCT 43.1 35.7*  MCV 80.9 78.8  PLT 190 361    Basic Metabolic Panel: Recent Labs  Lab 03/27/18 1923 03/28/18 0434  NA 137 137  K 3.7 3.7  CL 106 107  CO2 18* 21*  GLUCOSE 232* 137*  BUN 8 10  CREATININE 0.71 0.71  CALCIUM 8.8* 8.3*     DVT prophylaxis: SCDs  Code Status: Full code  Family Communication: No family at bedside  Disposition Plan: To be decided   Consultants:  Cardiology  Procedures:  None   Antibiotics:   Anti-infectives (From admission, onward)   Start      Dose/Rate Route Frequency Ordered Stop   03/28/18 1430  cefTRIAXone (ROCEPHIN) 2 g in sodium chloride 0.9 % 100 mL IVPB     2 g 200 mL/hr over 30 Minutes Intravenous Every 24 hours 03/28/18 1331     03/28/18 0900  vancomycin (VANCOCIN) IVPB 1000 mg/200 mL premix  Status:  Discontinued     1,000 mg 200 mL/hr over 60 Minutes Intravenous Every 12 hours 03/27/18 2100 03/28/18 1332   03/28/18 0600  ceFEPIme (MAXIPIME) 1 g in sodium chloride 0.9 % 100 mL IVPB  Status:  Discontinued     1 g 200 mL/hr over 30 Minutes Intravenous Every 8 hours 03/28/18 0317 03/28/18 0318   03/28/18 0400  ceFEPIme (MAXIPIME) 2 g in sodium chloride 0.9 % 100 mL IVPB  Status:  Discontinued     2 g 200 mL/hr over 30 Minutes Intravenous Every 8 hours 03/27/18 2100 03/28/18 1331   03/27/18 1930  ceFEPIme (MAXIPIME) 2 g in sodium chloride 0.9 % 100 mL IVPB     2 g 200 mL/hr over 30 Minutes Intravenous  Once 03/27/18 1925 03/27/18 2015   03/27/18 1930  metroNIDAZOLE (FLAGYL) IVPB 500 mg  Status:  Discontinued     500 mg 100 mL/hr over 60 Minutes Intravenous Every 8 hours 03/27/18 1925 03/28/18 0318   03/27/18 1930  vancomycin (VANCOCIN) IVPB 1000 mg/200 mL premix     1,000 mg 200 mL/hr over 60 Minutes Intravenous  Once 03/27/18 1925 03/27/18 2116       Objective  Vitals:   03/28/18 0010 03/28/18 0807 03/28/18 0955 03/28/18 1135  BP: 109/66 100/69 96/71 (!) 103/59  Pulse: 80 63  (!) 56  Resp: (!) 24   18  Temp: 99.9 F (37.7 C) 97.6 F (36.4 C)  98.2 F (36.8 C)  TempSrc: Oral Oral  Oral  SpO2: 100% 98%  98%  Weight: 63.7 kg     Height:        Intake/Output Summary (Last 24 hours) at 03/28/2018 1418 Last data filed at 03/27/2018 2104 Gross per 24 hour  Intake 1750 ml  Output 200 ml  Net 1550 ml   Filed Weights   03/27/18 2303 03/28/18 0010  Weight: 65.8 kg 63.7 kg     Physical Examination:    General: Appears in no acute distress  Cardiovascular: S1-S2, regular  Respiratory: Clear to  auscultation bilaterally  Abdomen: Soft, nontender, no organomegaly  Extremities: Bilateral above-knee amputation  Neurologic: Alert, oriented to self only     Data Reviewed: I have personally reviewed following labs and imaging studies   Recent Results (from the past 240 hour(s))  Blood Culture (routine x 2)     Status: None (Preliminary result)   Collection Time: 03/27/18  7:00 PM  Result Value Ref Range Status   Specimen Description BLOOD RIGHT WRIST  Final   Special Requests   Final    BOTTLES DRAWN AEROBIC AND ANAEROBIC Blood Culture results may not be optimal due to an inadequate volume of blood received in culture bottles   Culture  Setup Time   Final    GRAM NEGATIVE RODS AEROBIC BOTTLE ONLY CRITICAL RESULT CALLED TO, READ BACK BY AND VERIFIED WITH: A. MASTERS, PHARMD AT 1310 ON 03/28/18 BY C. JESSUP, MLT. Performed at [facility], [place] [address], [place], [place] [address]    Culture GRAM NEGATIVE RODS  Final   Report Status PENDING  Incomplete  Blood Culture (routine x 2)     Status: None (Preliminary result)   Collection Time: 03/27/18  7:35 PM  Result Value Ref Range Status   Specimen Description SITE NOT SPECIFIED  Final   Special Requests   Final    BOTTLES DRAWN AEROBIC AND ANAEROBIC Blood Culture adequate volume   Culture  Setup Time   Final    GRAM NEGATIVE RODS IN BOTH AEROBIC AND ANAEROBIC BOTTLES CRITICAL RESULT CALLED TO, READ BACK BY AND VERIFIED WITH: A. MASTERS, PHARMD AT 1310 ON 03/28/18 BY C. JESSUP, MLT. Performed at [facility], [place] [address] [place], [place] [address]    Culture GRAM NEGATIVE RODS  Final   Report Status PENDING  Incomplete  Blood Culture ID Panel (Reflexed)     Status: Abnormal   Collection Time: 03/27/18  7:35 PM  Result Value Ref Range Status   Enterococcus species NOT DETECTED NOT DETECTED Final   Listeria monocytogenes NOT DETECTED NOT DETECTED Final   Staphylococcus species NOT DETECTED NOT DETECTED Final    Staphylococcus aureus NOT DETECTED NOT DETECTED Final   Streptococcus species NOT DETECTED NOT DETECTED Final   Streptococcus agalactiae NOT DETECTED NOT DETECTED Final   Streptococcus pneumoniae NOT DETECTED NOT DETECTED Final   Streptococcus pyogenes NOT DETECTED NOT DETECTED Final   Acinetobacter baumannii NOT DETECTED NOT DETECTED Final   Enterobacteriaceae species DETECTED (A) NOT DETECTED Final    Comment: Enterobacteriaceae represent a large family of gram-negative bacteria, not a single organism. CRITICAL RESULT CALLED TO, READ BACK BY AND VERIFIED WITH: A. MASTERS, PHARMD  AT 1310 03/28/18 BY C. JESSUP, MLT.    Enterobacter cloacae complex NOT DETECTED NOT DETECTED Final   Escherichia coli NOT DETECTED NOT DETECTED Final   Klebsiella oxytoca DETECTED (A) NOT DETECTED Final    Comment: CRITICAL RESULT CALLED TO, READ BACK BY AND VERIFIED WITH: A. MASTERS, PHARMD AT 1310 03/28/18 BY C. JESSUP, MLT.    Klebsiella pneumoniae NOT DETECTED NOT DETECTED Final   Proteus species NOT DETECTED NOT DETECTED Final   Serratia marcescens NOT DETECTED NOT DETECTED Final   Carbapenem resistance NOT DETECTED NOT DETECTED Final   Haemophilus influenzae NOT DETECTED NOT DETECTED Final   Neisseria meningitidis NOT DETECTED NOT DETECTED Final   Pseudomonas aeruginosa NOT DETECTED NOT DETECTED Final   Candida albicans NOT DETECTED NOT DETECTED Final   Candida glabrata NOT DETECTED NOT DETECTED Final   Candida krusei NOT DETECTED NOT DETECTED Final   Candida parapsilosis NOT DETECTED NOT DETECTED Final   Candida tropicalis NOT DETECTED NOT DETECTED Final    Comment: Performed at [facility], [place] [address], [place], [place] [address]     Liver Function Tests: Recent Labs  Lab 03/27/18 1923 03/28/18 0434  AST 276* 156*  ALT 259* 196*  ALKPHOS 340* 259*  BILITOT 3.7* 4.7*  PROT 7.2 6.3*  ALBUMIN 3.3* 2.7*   No results for input(s): LIPASE, AMYLASE in the last 168 hours. No results  for input(s): AMMONIA in the last 168 hours.  Cardiac Enzymes: Recent Labs  Lab 03/28/18 0434 03/28/18 0732  CKTOTAL 26*  --   CKMB 0.7  --   TROPONINI 0.05* 0.04*   BNP (last 3 results) No results for input(s): BNP in the last 8760 hours.  ProBNP (last 3 results) No results for input(s): PROBNP in the last 8760 hours.    Studies: Dg Chest Port 1 View  Result Date: 03/27/2018 CLINICAL DATA:  Altered level of consciousness.  Fevers.  Sepsis. EXAM: PORTABLE CHEST 1 VIEW COMPARISON:  One-view chest x-ray 10/30/2017. FINDINGS: The heart is enlarged. Asymmetric left lower lobe airspace disease is present. No definite effusions are present. The right lung is clear. Atherosclerotic calcifications are present at the aortic arch. The visualized soft tissues and bony thorax are unremarkable. IMPRESSION: 1. Asymmetric left lower lobe airspace disease is concerning for pneumonia. 2. Aortic atherosclerosis. Electronically Signed   By: San Morelle M.D.   On: 03/27/2018 20:34   US Abdomen Limited Ruq  Result Date: 03/28/2018 CLINICAL DATA:  Elevated liver function tests. Prior cholecystectomy. EXAM: ULTRASOUND ABDOMEN LIMITED RIGHT UPPER QUADRANT COMPARISON:  None. FINDINGS: Gallbladder: Surgically absent. Common bile duct: Diameter: 3 mm, within normal limits. Liver: 1.2 cm benign-appearing cyst in right hepatic lobe. No liver mass identified. Within normal limits in parenchymal echogenicity. Portal vein is patent on color Doppler imaging with normal direction of blood flow towards the liver. IMPRESSION: Prior cholecystectomy.  No evidence of biliary ductal dilatation. Small benign right hepatic lobe cyst. Electronically Signed   By: Earle Gell M.D.   On: 03/28/2018 11:27    Scheduled Meds: . aspirin EC  81 mg Oral Daily  . cholecalciferol  2,000 Units Oral Daily  . enoxaparin (LOVENOX) injection  40 mg Subcutaneous Q24H  . lisinopril  10 mg Oral Daily  . multivitamin with minerals  1  tablet Oral Daily  . pantoprazole  40 mg Oral Q lunch  . polyvinyl alcohol  1-2 drop Both Eyes Daily  . pregabalin  150 mg Oral Daily  . pregabalin  300 mg Oral QHS  . rosuvastatin  20 mg Oral QHS      Time spent: 25 min  [facility] Pager [phone]. If 7PM-7AM, please contact night-coverage at [email or web address], Office  [phone]  password [email or web address]  03/28/2018, 2:18 PM  LOS: 1 day

## 2018-03-28 NOTE — Progress Notes (Signed)
ANTICOAGULATION CONSULT NOTE - Initial Consult  Pharmacy Consult for heparin Indication: chest pain/ACS  Allergies  Allergen Reactions  . Bee Venom Anaphylaxis  . Beta Adrenergic Blockers Other (See Comments)    Bradycardia with Carvedilol   . Carvedilol Other (See Comments)    Bradycardia with all Beta blockers   . Influenza Vaccines Other (See Comments)    Made patient "sick for months"  . Atorvastatin Other (See Comments)    Severe pain in stumps  . Latex Rash    Patient Measurements: Height: 5\' 11"  (180.3 cm) Weight: 140 lb 6.9 oz (63.7 kg) IBW/kg (Calculated) : 75.3 Heparin Dosing Weight: 65.8 kg  Vital Signs: Temp: 97.6 F (36.4 C) (09/07 0807) Temp Source: Oral (09/07 0807) BP: 100/69 (09/07 0807) Pulse Rate: 63 (09/07 0807)  Labs: Recent Labs    03/27/18 1923 03/28/18 0434 03/28/18 0732  HGB 13.9 11.9*  --   HCT 43.1 35.7*  --   PLT 190 162  --   HEPARINUNFRC  --   --  0.31  CREATININE 0.71 0.71  --   CKTOTAL  --  26*  --   CKMB  --  0.7  --   TROPONINI  --  0.05*  --     Estimated Creatinine Clearance: 70.8 mL/min (by C-G formula based on SCr of 0.71 mg/dL).   Medical History: Past Medical History:  Diagnosis Date  . Atherosclerotic PVD with ulceration ([place])    left foot  . CAD (coronary artery disease) CARDIOLOGIST- DR KATZ-- VISIT 06-05-2011 IN [facility]   non-STEMI, 2007.Marland Kitchenoccluded circumflex.. Taxus stent placed...residual 80% LAD...50% RCA  . Cardiomyopathy, ischemic    EF 45% per ECHO 2008  //   EF 25%, echo, August, 2013 //  Echo (8/15):  Mild LVH, EF 30-35%, ant-lat and lat AK, inf HK, Gr 1 DD, mild MR, mild LAE  . Carotid artery disease ([place])    a.  Doppler, February, 2012, 0-39% bilateral,Mild smooth plaque;  b.  Carotid US (8/15):  Bilateral 1-39% ICA >>> F/u 2 years  . Chronic indwelling Foley catheter   . Constipation   . Dementia   . Diabetes mellitus    INSULIN-DEPENDent  . Fatigue SEVERE  . H/O pleural effusion 2008   POST  THORACENTESIS  . History of colon polyps PRECANCEROUS  . Hyperlipidemia   . Hypertension   . Impotence   . Increased prostate specific antigen (PSA) velocity   . Insomnia   . Lung nodule    resolved 11-2006 CT Chest  . Multiple sclerosis ([place]) [place] -- LAST VISIT 11-20-2010  NOTE W/ CHART  . PAC (premature atrial contraction)    December, 2013  . Pulmonary hypertension ([place])    moderate ECHO Jan 2008  . Scoliosis associated with other condition   . Sleep apnea   . Systolic heart failure   . TIA (transient ischemic attack)   . Tobacco abuse    quit   . Urinary retention    dx ~ 2-12, like from [place], now with a catheter, saw urology  . Urinary tract infection    hx of    Medications:  See medication history  Assessment: 77 yo man to start heparin for CP.  He was not on anticoagulation PTA. Heparin level 9/7 AM 0.31, which is on the lower end of therapeutic for patient. Will increase patient's drip rate and recheck heparin level. Hgb 11.9, PLTc 162, will continue to monitor CBC. No bleeding  noted for  patient at this time.  Goal of Therapy:  Heparin level 0.3-0.7 units/ml Monitor platelets by anticoagulation protocol: Yes   Plan:  Increase Heparin drip to 850 units/hr Check heparin level in 6 hours Daily HL and CBC while on heparin Monitor for bleeding complications  Britt Boozer, PharmD PGY1 Pharmacy Resident 03/28/2018,9:19 AM

## 2018-03-28 NOTE — Progress Notes (Signed)
Progress Note  Patient Name: Jaime [name] Date of Encounter: 03/28/2018  Primary Cardiologist:   [name], MD   Subjective   No chest pain.  No SOB.    Inpatient Medications    Scheduled Meds: . aspirin EC  81 mg Oral Daily  . cholecalciferol  2,000 Units Oral Daily  . lisinopril  10 mg Oral Daily  . multivitamin with minerals  1 tablet Oral Daily  . pantoprazole  40 mg Oral Q lunch  . polyvinyl alcohol  1-2 drop Both Eyes Daily  . pregabalin  150 mg Oral Daily  . pregabalin  300 mg Oral QHS  . rosuvastatin  20 mg Oral QHS   Continuous Infusions: . ceFEPime (MAXIPIME) IV 2 g (03/28/18 0437)  . heparin 850 Units/hr (03/28/18 0949)  . vancomycin 1,000 mg (03/28/18 0952)   PRN Meds: albuterol, furosemide, traMADol   Vital Signs    Vitals:   03/27/18 2330 03/28/18 0010 03/28/18 0807 03/28/18 0955  BP: 102/63 109/66 100/69 96/71  Pulse: 62 80 63   Resp:  (!) 24    Temp:  99.9 F (37.7 C) 97.6 F (36.4 C)   TempSrc:  Oral Oral   SpO2: 97% 100% 98%   Weight:  63.7 kg    Height:        Intake/Output Summary (Last 24 hours) at 03/28/2018 1033 Last data filed at 03/27/2018 2104 Gross per 24 hour  Intake 1750 ml  Output 200 ml  Net 1550 ml   Filed Weights   03/27/18 2303 03/28/18 0010  Weight: 65.8 kg 63.7 kg    Telemetry    NSR, first degree AV block, blocked PACs,  Mobitz 1, aberrant conduction - Personally Reviewed  ECG    NA - Personally Reviewed  Physical Exam   GEN: No acute distress.   Neck: No  JVD Cardiac: RRR, no murmurs, rubs, or gallops.  Respiratory: Clear  to auscultation bilaterally. GI: Soft, nontender, non-distended  MS: No  edema; No deformity.  Bilateral amputations.   Neuro:  Nonfocal  Psych: Normal affect   Labs    Chemistry Recent Labs  Lab 03/27/18 1923 03/28/18 0434  NA 137 137  K 3.7 3.7  CL 106 107  CO2 18* 21*  GLUCOSE 232* 137*  BUN 8 10  CREATININE 0.71 0.71  CALCIUM 8.8* 8.3*  PROT 7.2 6.3*    ALBUMIN 3.3* 2.7*  AST 276* 156*  ALT 259* 196*  ALKPHOS 340* 259*  BILITOT 3.7* 4.7*  GFRNONAA >60 >60  GFRAA >60 >60  ANIONGAP 13 9     Hematology Recent Labs  Lab 03/27/18 1923 03/28/18 0434  WBC 12.3* 13.0*  RBC 5.33 4.53  HGB 13.9 11.9*  HCT 43.1 35.7*  MCV 80.9 78.8  MCH 26.1 26.3  MCHC 32.3 33.3  RDW 17.2* 16.5*  PLT 190 162    Cardiac Enzymes Recent Labs  Lab 03/28/18 0434 03/28/18 0732  TROPONINI 0.05* 0.04*    Recent Labs  Lab 03/27/18 1935 03/27/18 2217  TROPIPOC 0.08 0.10*     BNPNo results for input(s): BNP, PROBNP in the last 168 hours.   DDimer No results for input(s): DDIMER in the last 168 hours.   Radiology    Dg Chest Port 1 View  Result Date: 03/27/2018 CLINICAL DATA:  Altered level of consciousness.  Fevers.  Sepsis. EXAM: PORTABLE CHEST 1 VIEW COMPARISON:  One-view chest x-ray 10/30/2017. FINDINGS: The heart is enlarged. Asymmetric left lower lobe airspace disease  is present. No definite effusions are present. The right lung is clear. Atherosclerotic calcifications are present at the aortic arch. The visualized soft tissues and bony thorax are unremarkable. IMPRESSION: 1. Asymmetric left lower lobe airspace disease is concerning for pneumonia. 2. Aortic atherosclerosis. Electronically Signed   By: San Morelle M.D.   On: 03/27/2018 20:34    Cardiac Studies   ECHO:  Pending.   Patient Profile     77 y.o. male with a history of multiple sclerosis, hypertension, CHF, CAD status post DES, pulmonary attention, who is being seen today for the evaluation of NSTEMI in the setting of sepsis  Assessment & Plan    ELEVATED TROPONIN:   Troponin trend is flat and nonspecific.   Echo is pending.  Given this non diagnostic trend.  Lack of overt cardiac symptoms significant non cardiac presentation with sepsis no further cardiac work up is indicated.    He does not need systemic dose heparin.   I cancelled the echo.  BRADYCARDIA:  He is  not having symptoms related to this.  No prolonged pauses.  This can be followed as an outpatient.    For questions or updates, please contact [place] Please consult [email or web address] for contact info under Cardiology/STEMI.   Signed, Minus Breeding, MD  03/28/2018, 10:33 AM

## 2018-03-29 DIAGNOSIS — E119 Type 2 diabetes mellitus without complications: Secondary | ICD-10-CM

## 2018-03-29 DIAGNOSIS — B961 Klebsiella pneumoniae [K. pneumoniae] as the cause of diseases classified elsewhere: Secondary | ICD-10-CM

## 2018-03-29 DIAGNOSIS — Z87440 Personal history of urinary (tract) infections: Secondary | ICD-10-CM

## 2018-03-29 DIAGNOSIS — R413 Other amnesia: Secondary | ICD-10-CM

## 2018-03-29 DIAGNOSIS — B965 Pseudomonas (aeruginosa) (mallei) (pseudomallei) as the cause of diseases classified elsewhere: Secondary | ICD-10-CM

## 2018-03-29 DIAGNOSIS — B952 Enterococcus as the cause of diseases classified elsewhere: Secondary | ICD-10-CM

## 2018-03-29 DIAGNOSIS — Z89611 Acquired absence of right leg above knee: Secondary | ICD-10-CM

## 2018-03-29 DIAGNOSIS — R7881 Bacteremia: Secondary | ICD-10-CM

## 2018-03-29 DIAGNOSIS — K7200 Acute and subacute hepatic failure without coma: Secondary | ICD-10-CM

## 2018-03-29 DIAGNOSIS — Z955 Presence of coronary angioplasty implant and graft: Secondary | ICD-10-CM

## 2018-03-29 DIAGNOSIS — Z89612 Acquired absence of left leg above knee: Secondary | ICD-10-CM

## 2018-03-29 DIAGNOSIS — I248 Other forms of acute ischemic heart disease: Secondary | ICD-10-CM

## 2018-03-29 DIAGNOSIS — R410 Disorientation, unspecified: Secondary | ICD-10-CM

## 2018-03-29 DIAGNOSIS — Z8619 Personal history of other infectious and parasitic diseases: Secondary | ICD-10-CM

## 2018-03-29 DIAGNOSIS — I2510 Atherosclerotic heart disease of native coronary artery without angina pectoris: Secondary | ICD-10-CM

## 2018-03-29 DIAGNOSIS — F17290 Nicotine dependence, other tobacco product, uncomplicated: Secondary | ICD-10-CM

## 2018-03-29 DIAGNOSIS — N390 Urinary tract infection, site not specified: Secondary | ICD-10-CM

## 2018-03-29 LAB — CBC
HCT: 35.3 % — ABNORMAL LOW (ref 39.0–52.0)
HEMOGLOBIN: 11.7 g/dL — AB (ref 13.0–17.0)
MCH: 26.1 pg (ref 26.0–34.0)
MCHC: 33.1 g/dL (ref 30.0–36.0)
MCV: 78.8 fL (ref 78.0–100.0)
Platelets: 161 10*3/uL (ref 150–400)
RBC: 4.48 MIL/uL (ref 4.22–5.81)
RDW: 17.1 % — ABNORMAL HIGH (ref 11.5–15.5)
WBC: 7 10*3/uL (ref 4.0–10.5)

## 2018-03-29 LAB — COMPREHENSIVE METABOLIC PANEL
ALT: 143 U/L — ABNORMAL HIGH (ref 0–44)
ANION GAP: 9 (ref 5–15)
AST: 84 U/L — ABNORMAL HIGH (ref 15–41)
Albumin: 2.6 g/dL — ABNORMAL LOW (ref 3.5–5.0)
Alkaline Phosphatase: 219 U/L — ABNORMAL HIGH (ref 38–126)
BUN: 12 mg/dL (ref 8–23)
CHLORIDE: 107 mmol/L (ref 98–111)
CO2: 22 mmol/L (ref 22–32)
Calcium: 8.2 mg/dL — ABNORMAL LOW (ref 8.9–10.3)
Creatinine, Ser: 0.73 mg/dL (ref 0.61–1.24)
GFR calc Af Amer: >60 mL/min (ref >60)
GFR calc non Af Amer: >60 mL/min (ref >60)
GLUCOSE: 139 mg/dL — AB (ref 70–99)
Potassium: 3.8 mmol/L (ref 3.5–5.1)
Sodium: 138 mmol/L (ref 135–145)
TOTAL PROTEIN: 6.3 g/dL — AB (ref 6.5–8.1)
Total Bilirubin: 2.8 mg/dL — ABNORMAL HIGH (ref 0.3–1.2)

## 2018-03-29 LAB — LEGIONELLA PNEUMOPHILA SEROGP 1 UR AG: L. pneumophila Serogp 1 Ur Ag: NEGATIVE

## 2018-03-29 MED ORDER — SODIUM CHLORIDE 0.9 % IV SOLN
2.0000 g | Freq: Three times a day (TID) | INTRAVENOUS | Status: DC
  Administered 2018-03-29 – 2018-03-30 (×3): 2 g via INTRAVENOUS
  Filled 2018-03-29 (×5): qty 2

## 2018-03-29 NOTE — Progress Notes (Signed)
   [facility] will sign off.   Medication Recommendations:  Therapy per primary team. Other recommendations (labs, testing, etc):  None Follow up as an outpatient:  He is to see Dr. Meda Coffee for routine follow up early next year and should be on our call back list.

## 2018-03-29 NOTE — Progress Notes (Signed)
[facility]  PROGRESS NOTE  Jaime [name] QBV:[id number] DOB: February 05, 1941 DOA: 03/27/2018 PCP: [name], MD   Brief HPI:   77 year old male with history of multiple sclerosis, hypertension, hyperlipidemia, type 2 diabetes mellitus, CAD status post DES, PVD, pulmonary hypertension, CHF EF 30 to 35% came with complaints of dysuria and fever.  Patient was started on empiric antibiotics for sepsis due to UTI.  Also had elevated troponin, cardiology was consulted.   Subjective   Patient seen and examined, alert, oriented x3.  Not confused anymore.   Assessment/Plan:     1. Klebsiella, enterococcus bacteremia-blood cultures growing Klebsiella, enterococcus.  Urine culture is pending.  Continue ceftriaxone, Vancomycin.  Follow blood culture results 1  2. Sepsis due to UTI-blood culture growing Klebsiella.  Started on ceftriaxone as above.  3. Elevated troponin-cardiology was consulted, no further intervention as per cardiology.  Troponin 0.04, likely from demand ischemia from sepsis.  4. Diabetes mellitus-sliding scale insulin NovoLog.  Blood glucose well controlled.  5. Transaminitis-LFTs are improving, likely from shock liver.  Abdominal ultrasound showed no acute abnormality.  Hepatitis panel is pending     CBG: No results for input(s): GLUCAP in the last 168 hours.  CBC: Recent Labs  Lab 03/27/18 1923 03/28/18 0434 03/29/18 0426  WBC 12.3* 13.0* 7.0  NEUTROABS 11.0*  --   --   HGB 13.9 11.9* 11.7*  HCT 43.1 35.7* 35.3*  MCV 80.9 78.8 78.8  PLT 190 162 280    Basic Metabolic Panel: Recent Labs  Lab 03/27/18 1923 03/28/18 0434 03/29/18 0426  NA 137 137 138  K 3.7 3.7 3.8  CL 106 107 107  CO2 18* 21* 22  GLUCOSE 232* 137* 139*  BUN 8 10 12   CREATININE 0.71 0.71 0.73  CALCIUM 8.8* 8.3* 8.2*     DVT prophylaxis: SCDs  Code Status: Full code  Family Communication: No family at bedside  Disposition Plan: To be  decided   Consultants:  Cardiology  Procedures:  None   Antibiotics:   Anti-infectives (From admission, onward)   Start     Dose/Rate Route Frequency Ordered Stop   03/28/18 2200  vancomycin (VANCOCIN) IVPB 1000 mg/200 mL premix     1,000 mg 200 mL/hr over 60 Minutes Intravenous Every 12 hours 03/28/18 1725     03/28/18 1430  cefTRIAXone (ROCEPHIN) 2 g in sodium chloride 0.9 % 100 mL IVPB     2 g 200 mL/hr over 30 Minutes Intravenous Every 24 hours 03/28/18 1331     03/28/18 0900  vancomycin (VANCOCIN) IVPB 1000 mg/200 mL premix  Status:  Discontinued     1,000 mg 200 mL/hr over 60 Minutes Intravenous Every 12 hours 03/27/18 2100 03/28/18 1332   03/28/18 0600  ceFEPIme (MAXIPIME) 1 g in sodium chloride 0.9 % 100 mL IVPB  Status:  Discontinued     1 g 200 mL/hr over 30 Minutes Intravenous Every 8 hours 03/28/18 0317 03/28/18 0318   03/28/18 0400  ceFEPIme (MAXIPIME) 2 g in sodium chloride 0.9 % 100 mL IVPB  Status:  Discontinued     2 g 200 mL/hr over 30 Minutes Intravenous Every 8 hours 03/27/18 2100 03/28/18 1331   03/27/18 1930  ceFEPIme (MAXIPIME) 2 g in sodium chloride 0.9 % 100 mL IVPB     2 g 200 mL/hr over 30 Minutes Intravenous  Once 03/27/18 1925 03/27/18 2015   03/27/18 1930  metroNIDAZOLE (FLAGYL) IVPB 500 mg  Status:  Discontinued  500 mg 100 mL/hr over 60 Minutes Intravenous Every 8 hours 03/27/18 1925 03/28/18 0318   03/27/18 1930  vancomycin (VANCOCIN) IVPB 1000 mg/200 mL premix     1,000 mg 200 mL/hr over 60 Minutes Intravenous  Once 03/27/18 1925 03/27/18 2116       Objective   Vitals:   03/29/18 0520 03/29/18 0700 03/29/18 0800 03/29/18 0858  BP:  120/77 120/77   Pulse: 87 80 79 89  Resp: (!) 24 (!) 23 16   Temp: 100.2 F (37.9 C) 99.2 F (37.3 C)  98.6 F (37 C)  TempSrc:  Oral  Oral  SpO2: 97% 97% 98%   Weight: 66.1 kg     Height:        Intake/Output Summary (Last 24 hours) at 03/29/2018 0945 Last data filed at 03/29/2018 0521 Gross  per 24 hour  Intake 1364.1 ml  Output 2150 ml  Net -785.9 ml   Filed Weights   03/27/18 2303 03/28/18 0010 03/29/18 0520  Weight: 65.8 kg 63.7 kg 66.1 kg     Physical Examination:     Mouth: Oral mucosa is moist, no lesions on palate,  Neck: Supple, no deformities, masses, or tenderness Lungs: Normal respiratory effort, bilateral clear to auscultation, no crackles or wheezes.  Heart: Regular rate and rhythm, S1 and S2 normal, no murmurs, rubs auscultated Abdomen: BS normoactive,soft,nondistended,non-tender to palpation,no organomegaly Extremities: Bilateral AKA Neuro : Alert and oriented to time, place and person, No focal deficits     Data Reviewed: I have personally reviewed following labs and imaging studies   Recent Results (from the past 240 hour(s))  Blood Culture (routine x 2)     Status: None (Preliminary result)   Collection Time: 03/27/18  7:00 PM  Result Value Ref Range Status   Specimen Description BLOOD RIGHT WRIST  Final   Special Requests   Final    BOTTLES DRAWN AEROBIC AND ANAEROBIC Blood Culture results may not be optimal due to an inadequate volume of blood received in culture bottles   Culture  Setup Time   Final    GRAM NEGATIVE RODS AEROBIC BOTTLE ONLY CRITICAL RESULT CALLED TO, READ BACK BY AND VERIFIED WITH: A. MASTERS, PHARMD AT 1310 ON 03/28/18 BY C. JESSUP, MLT. GRAM POSITIVE COCCI IN PAIRS AND CHAINS ANAEROBIC BOTTLE ONLY CRITICAL RESULT CALLED TO, READ BACK BY AND VERIFIED WITH: A. MASTERS, PHARMD AT 1705 ON 03/28/18 BY C. JESSUP, MLT. Performed at [facility], [place] [address] [place], [place] [address]    Culture Lonell Grandchild NEGATIVE RODS GRAM POSITIVE COCCI   Final   Report Status PENDING  Incomplete  Blood Culture ID Panel (Reflexed)     Status: Abnormal   Collection Time: 03/27/18  7:00 PM  Result Value Ref Range Status   Enterococcus species DETECTED (A) NOT DETECTED Final    Comment: CRITICAL RESULT CALLED TO, READ BACK BY AND  VERIFIED WITH: A. MASTERS, PHARMD AT 1705 ON 03/28/18 BY C. JESSUP, MLT.    Vancomycin resistance NOT DETECTED NOT DETECTED Final   Listeria monocytogenes NOT DETECTED NOT DETECTED Final   Staphylococcus species NOT DETECTED NOT DETECTED Final   Staphylococcus aureus NOT DETECTED NOT DETECTED Final   Streptococcus species NOT DETECTED NOT DETECTED Final   Streptococcus agalactiae NOT DETECTED NOT DETECTED Final   Streptococcus pneumoniae NOT DETECTED NOT DETECTED Final   Streptococcus pyogenes NOT DETECTED NOT DETECTED Final   Acinetobacter baumannii NOT DETECTED NOT DETECTED Final   Enterobacteriaceae species NOT DETECTED  NOT DETECTED Final   Enterobacter cloacae complex NOT DETECTED NOT DETECTED Final   Escherichia coli NOT DETECTED NOT DETECTED Final   Klebsiella oxytoca NOT DETECTED NOT DETECTED Final   Klebsiella pneumoniae NOT DETECTED NOT DETECTED Final   Proteus species NOT DETECTED NOT DETECTED Final   Serratia marcescens NOT DETECTED NOT DETECTED Final   Haemophilus influenzae NOT DETECTED NOT DETECTED Final   Neisseria meningitidis NOT DETECTED NOT DETECTED Final   Pseudomonas aeruginosa NOT DETECTED NOT DETECTED Final   Candida albicans NOT DETECTED NOT DETECTED Final   Candida glabrata NOT DETECTED NOT DETECTED Final   Candida krusei NOT DETECTED NOT DETECTED Final   Candida parapsilosis NOT DETECTED NOT DETECTED Final   Candida tropicalis NOT DETECTED NOT DETECTED Final    Comment: Performed at [facility], [place] [address], [place], [place] [address]  Blood Culture (routine x 2)     Status: None (Preliminary result)   Collection Time: 03/27/18  7:35 PM  Result Value Ref Range Status   Specimen Description SITE NOT SPECIFIED  Final   Special Requests   Final    BOTTLES DRAWN AEROBIC AND ANAEROBIC Blood Culture adequate volume   Culture  Setup Time   Final    GRAM NEGATIVE RODS IN BOTH AEROBIC AND ANAEROBIC BOTTLES CRITICAL RESULT CALLED TO, READ BACK BY AND  VERIFIED WITH: A. MASTERS, PHARMD AT 1310 ON 03/28/18 BY C. JESSUP, MLT. Performed at [facility], [place] [address], [place], [place] [address]    Culture GRAM NEGATIVE RODS  Final   Report Status PENDING  Incomplete  Blood Culture ID Panel (Reflexed)     Status: Abnormal   Collection Time: 03/27/18  7:35 PM  Result Value Ref Range Status   Enterococcus species NOT DETECTED NOT DETECTED Final   Listeria monocytogenes NOT DETECTED NOT DETECTED Final   Staphylococcus species NOT DETECTED NOT DETECTED Final   Staphylococcus aureus NOT DETECTED NOT DETECTED Final   Streptococcus species NOT DETECTED NOT DETECTED Final   Streptococcus agalactiae NOT DETECTED NOT DETECTED Final   Streptococcus pneumoniae NOT DETECTED NOT DETECTED Final   Streptococcus pyogenes NOT DETECTED NOT DETECTED Final   Acinetobacter baumannii NOT DETECTED NOT DETECTED Final   Enterobacteriaceae species DETECTED (A) NOT DETECTED Final    Comment: Enterobacteriaceae represent a large family of gram-negative bacteria, not a single organism. CRITICAL RESULT CALLED TO, READ BACK BY AND VERIFIED WITH: A. MASTERS, PHARMD AT 1310 03/28/18 BY C. JESSUP, MLT.    Enterobacter cloacae complex NOT DETECTED NOT DETECTED Final   Escherichia coli NOT DETECTED NOT DETECTED Final   Klebsiella oxytoca DETECTED (A) NOT DETECTED Final    Comment: CRITICAL RESULT CALLED TO, READ BACK BY AND VERIFIED WITH: A. MASTERS, PHARMD AT 1310 03/28/18 BY C. JESSUP, MLT.    Klebsiella pneumoniae NOT DETECTED NOT DETECTED Final   Proteus species NOT DETECTED NOT DETECTED Final   Serratia marcescens NOT DETECTED NOT DETECTED Final   Carbapenem resistance NOT DETECTED NOT DETECTED Final   Haemophilus influenzae NOT DETECTED NOT DETECTED Final   Neisseria meningitidis NOT DETECTED NOT DETECTED Final   Pseudomonas aeruginosa NOT DETECTED NOT DETECTED Final   Candida albicans NOT DETECTED NOT DETECTED Final   Candida glabrata NOT DETECTED NOT DETECTED  Final   Candida krusei NOT DETECTED NOT DETECTED Final   Candida parapsilosis NOT DETECTED NOT DETECTED Final   Candida tropicalis NOT DETECTED NOT DETECTED Final    Comment: Performed at [facility],  [address] [place], [place] [address]     Liver Function Tests: Recent Labs  Lab 03/27/18 1923 03/28/18 0434 03/29/18 0426  AST 276* 156* 84*  ALT 259* 196* 143*  ALKPHOS 340* 259* 219*  BILITOT 3.7* 4.7* 2.8*  PROT 7.2 6.3* 6.3*  ALBUMIN 3.3* 2.7* 2.6*   No results for input(s): LIPASE, AMYLASE in the last 168 hours. No results for input(s): AMMONIA in the last 168 hours.  Cardiac Enzymes: Recent Labs  Lab 03/28/18 0434 03/28/18 0732 03/28/18 1512  CKTOTAL 26*  --   --   CKMB 0.7  --   --   TROPONINI 0.05* 0.04* 0.03*   BNP (last 3 results) No results for input(s): BNP in the last 8760 hours.  ProBNP (last 3 results) No results for input(s): PROBNP in the last 8760 hours.    Studies: Dg Chest Port 1 View  Result Date: 03/27/2018 CLINICAL DATA:  Altered level of consciousness.  Fevers.  Sepsis. EXAM: PORTABLE CHEST 1 VIEW COMPARISON:  One-view chest x-ray 10/30/2017. FINDINGS: The heart is enlarged. Asymmetric left lower lobe airspace disease is present. No definite effusions are present. The right lung is clear. Atherosclerotic calcifications are present at the aortic arch. The visualized soft tissues and bony thorax are unremarkable. IMPRESSION: 1. Asymmetric left lower lobe airspace disease is concerning for pneumonia. 2. Aortic atherosclerosis. Electronically Signed   By: San Morelle M.D.   On: 03/27/2018 20:34   US Abdomen Limited Ruq  Result Date: 03/28/2018 CLINICAL DATA:  Elevated liver function tests. Prior cholecystectomy. EXAM: ULTRASOUND ABDOMEN LIMITED RIGHT UPPER QUADRANT COMPARISON:  None. FINDINGS: Gallbladder: Surgically absent. Common bile duct: Diameter: 3 mm, within normal limits. Liver: 1.2 cm benign-appearing cyst in right hepatic  lobe. No liver mass identified. Within normal limits in parenchymal echogenicity. Portal vein is patent on color Doppler imaging with normal direction of blood flow towards the liver. IMPRESSION: Prior cholecystectomy.  No evidence of biliary ductal dilatation. Small benign right hepatic lobe cyst. Electronically Signed   By: Earle Gell M.D.   On: 03/28/2018 11:27    Scheduled Meds: . aspirin EC  81 mg Oral Daily  . cholecalciferol  2,000 Units Oral Daily  . enoxaparin (LOVENOX) injection  40 mg Subcutaneous Q24H  . lisinopril  10 mg Oral Daily  . multivitamin with minerals  1 tablet Oral Daily  . pantoprazole  40 mg Oral Q lunch  . polyvinyl alcohol  1-2 drop Both Eyes Daily  . pregabalin  150 mg Oral Daily  . pregabalin  300 mg Oral QHS  . rosuvastatin  20 mg Oral QHS      Time spent: 25 min  [facility] Pager [phone]. If 7PM-7AM, please contact night-coverage at [email or web address], Office  [phone]  password [place]  03/29/2018, 9:45 AM  LOS: 2 days

## 2018-03-29 NOTE — Progress Notes (Addendum)
Patient has condom cath on and voiding red urine with blood clots. Vital signs stable no complaints at this time. R.N. Aware. Text page X.Blount N.P. And call return and she was made aware. If on Lovenox tonight hold it tonight.per Jeannette Corpus N.P. Patient does not have dose at night time.

## 2018-03-29 NOTE — Consult Note (Addendum)
[facility]    Date of Admission:  03/27/2018   Total days of antibiotics: 2 ceftriaxone/vanco               Reason for Consult: Enterococcal bacteremia    Referring Provider: CHAMP   Assessment: Enterococcal bacteremia  Pseudomonas UTI Delirium Demand Ischemia Shock liver?  Plan: 1. Continue vanco, change ceftriaxone to cefepime.  2. Await results of his BCx, sensi 3. Repeat BCx 4. I would not pursue TEE or endocarditis w/u with BCID positive for Enteroccocus, then BCx corrected to GNR. He appears to have a urine source for his clinical syndrome.   Thank you so much for this interesting consult,  Principal Problem:   Pneumonia Active Problems:   Sepsis ([place])   Acute lower UTI   Elevated troponin   . aspirin EC  81 mg Oral Daily  . cholecalciferol  2,000 Units Oral Daily  . enoxaparin (LOVENOX) injection  40 mg Subcutaneous Q24H  . lisinopril  10 mg Oral Daily  . multivitamin with minerals  1 tablet Oral Daily  . pantoprazole  40 mg Oral Q lunch  . polyvinyl alcohol  1-2 drop Both Eyes Daily  . pregabalin  150 mg Oral Daily  . pregabalin  300 mg Oral QHS  . rosuvastatin  20 mg Oral QHS    HPI: Jaime [name] is a 77 y.o. male with hx of DM, CAD with stent (2007) and ischemic CM (EF 30-35%), and previous K pneumo (R-amp) bacteremia (10-2017 with polymicrobial UCx). He comes to hospital on 9-6 with dysuria, confusion, temp 101, hypotension, tachycardia and CXR with asymmetric airspace disease concerning for pneumonia. He was also found to have +troponin. He also had +UA +Nitr, Small LE (he has had previous Klebsiella UTI). He also had elevated LFTs.  He was started on vanco/cefepime/flagyl and asa/heparin.  He has been seen by CV and felt to have demand ischemia, his LFTs have been attributed to shock liver.  His BCx are now positive for Enterococcus (on BCID) and Klebsiella (on BCID). His BCx are growing 2/2 GNR.   UCx is > 100k  GNR. Temp 101.5 last 12 h.  His mental status has cleared.   Review of Systems: Review of Systems  Unable to perform ROS: Acuity of condition  Constitutional: Positive for fever.  Respiratory: Positive for cough. Negative for shortness of breath.   Cardiovascular: Negative for chest pain.  Gastrointestinal: Negative for constipation and diarrhea.  Genitourinary: Positive for dysuria.  Psychiatric/Behavioral: Positive for memory loss.  Please see HPI. All other systems reviewed and negative.    Past Medical History:  Diagnosis Date  . Atherosclerotic PVD with ulceration ([place])    left foot  . CAD (coronary artery disease) CARDIOLOGIST- DR KATZ-- VISIT 06-05-2011 IN [facility]   non-STEMI, 2007.Marland Kitchenoccluded circumflex.. Taxus stent placed...residual 80% LAD...50% RCA  . Cardiomyopathy, ischemic    EF 45% per ECHO 2008  //   EF 25%, echo, August, 2013 //  Echo (8/15):  Mild LVH, EF 30-35%, ant-lat and lat AK, inf HK, Gr 1 DD, mild MR, mild LAE  . Carotid artery disease ([place])    a.  Doppler, February, 2012, 0-39% bilateral,Mild smooth plaque;  b.  Carotid US (8/15):  Bilateral 1-39% ICA >>> F/u 2 years  . Chronic indwelling Foley catheter   . Constipation   . Dementia   . Diabetes mellitus    INSULIN-DEPENDent  . Fatigue SEVERE  .  H/O pleural effusion 2008   POST THORACENTESIS  . History of colon polyps PRECANCEROUS  . Hyperlipidemia   . Hypertension   . Impotence   . Increased prostate specific antigen (PSA) velocity   . Insomnia   . Lung nodule    resolved 11-2006 CT Chest  . Multiple sclerosis ([place]) [place] -- LAST VISIT 11-20-2010  NOTE W/ CHART  . PAC (premature atrial contraction)    December, 2013  . Pulmonary hypertension ([place])    moderate ECHO Jan 2008  . Scoliosis associated with other condition   . Sleep apnea   . Systolic heart failure   . TIA (transient ischemic attack)   . Tobacco abuse    quit   . Urinary retention    dx ~ 2-12, like  from [place], now with a catheter, saw urology  . Urinary tract infection    hx of    Social History   Tobacco Use  . Smoking status: Former Smoker    Years: 50.00    Last attempt to quit: 07/22/2012    Years since quitting: 5.6  . Smokeless tobacco: Current User  . Tobacco comment: pt states that he is using E-cigs  Substance Use Topics  . Alcohol use: No    Alcohol/week: 0.0 standard drinks  . Drug use: No    Family History  Problem Relation Age of Onset  . Heart attack Mother 6  . Heart disease Mother   . Stroke Mother   . Hyperlipidemia Mother   . Hypertension Mother   . COPD Father   . Peripheral vascular disease Father   . Diabetes Brother   . Heart disease Brother   . Hypertension Brother   . Heart attack Brother   . Diabetes Daughter   . Colon cancer Neg Hx   . Prostate cancer Neg Hx      Medications:  Scheduled: . aspirin EC  81 mg Oral Daily  . cholecalciferol  2,000 Units Oral Daily  . enoxaparin (LOVENOX) injection  40 mg Subcutaneous Q24H  . lisinopril  10 mg Oral Daily  . multivitamin with minerals  1 tablet Oral Daily  . pantoprazole  40 mg Oral Q lunch  . polyvinyl alcohol  1-2 drop Both Eyes Daily  . pregabalin  150 mg Oral Daily  . pregabalin  300 mg Oral QHS  . rosuvastatin  20 mg Oral QHS    Abtx:  Anti-infectives (From admission, onward)   Start     Dose/Rate Route Frequency Ordered Stop   03/28/18 2200  vancomycin (VANCOCIN) IVPB 1000 mg/200 mL premix     1,000 mg 200 mL/hr over 60 Minutes Intravenous Every 12 hours 03/28/18 1725     03/28/18 1430  cefTRIAXone (ROCEPHIN) 2 g in sodium chloride 0.9 % 100 mL IVPB     2 g 200 mL/hr over 30 Minutes Intravenous Every 24 hours 03/28/18 1331     03/28/18 0900  vancomycin (VANCOCIN) IVPB 1000 mg/200 mL premix  Status:  Discontinued     1,000 mg 200 mL/hr over 60 Minutes Intravenous Every 12 hours 03/27/18 2100 03/28/18 1332   03/28/18 0600  ceFEPIme (MAXIPIME) 1 g in sodium chloride 0.9 % 100  mL IVPB  Status:  Discontinued     1 g 200 mL/hr over 30 Minutes Intravenous Every 8 hours 03/28/18 0317 03/28/18 0318   03/28/18 0400  ceFEPIme (MAXIPIME) 2 g in sodium chloride 0.9 % 100 mL IVPB  Status:  Discontinued     2 g 200 mL/hr over 30 Minutes Intravenous Every 8 hours 03/27/18 2100 03/28/18 1331   03/27/18 1930  ceFEPIme (MAXIPIME) 2 g in sodium chloride 0.9 % 100 mL IVPB     2 g 200 mL/hr over 30 Minutes Intravenous  Once 03/27/18 1925 03/27/18 2015   03/27/18 1930  metroNIDAZOLE (FLAGYL) IVPB 500 mg  Status:  Discontinued     500 mg 100 mL/hr over 60 Minutes Intravenous Every 8 hours 03/27/18 1925 03/28/18 0318   03/27/18 1930  vancomycin (VANCOCIN) IVPB 1000 mg/200 mL premix     1,000 mg 200 mL/hr over 60 Minutes Intravenous  Once 03/27/18 1925 03/27/18 2116        OBJECTIVE: Blood pressure 120/77, pulse 89, temperature 98.6 F (37 C), temperature source Oral, resp. rate 16, height '5\' 11"'$  (1.803 m), weight 66.1 kg, SpO2 98 %.  Physical Exam  Constitutional: He appears well-developed and well-nourished.  HENT:  Mouth/Throat: No oropharyngeal exudate.  Eyes: Pupils are equal, round, and reactive to light. EOM are normal.  Neck: Normal range of motion. Neck supple.  Cardiovascular: Normal rate, regular rhythm and normal heart sounds.  Pulmonary/Chest: Effort normal and breath sounds normal.  Abdominal: Soft. Bowel sounds are normal. He exhibits no distension. There is no tenderness.  Musculoskeletal:  Bilateral AKA  Lymphadenopathy:    He has no cervical adenopathy.  Neurological: He is alert.  Psychiatric: He has a normal mood and affect. His speech is normal. Cognition and memory are impaired.  Place- [facility] Date- October 2011 President- Bush    Lab Results Results for orders placed or performed during the hospital encounter of 03/27/18 (from the past 48 hour(s))  Blood Culture (routine x 2)     Status: None (Preliminary result)   Collection Time: 03/27/18   7:00 PM  Result Value Ref Range   Specimen Description BLOOD RIGHT WRIST    Special Requests      BOTTLES DRAWN AEROBIC AND ANAEROBIC Blood Culture results may not be optimal due to an inadequate volume of blood received in culture bottles   Culture  Setup Time      GRAM NEGATIVE RODS AEROBIC BOTTLE ONLY CRITICAL RESULT CALLED TO, READ BACK BY AND VERIFIED WITH: A. MASTERS, PHARMD AT 1310 ON 03/28/18 BY C. JESSUP, MLT. CORRECTED RESULTS PREVIOUSLY REPORTED AS: GRAM POSITIVE COCCI IN PAIRS AND CHAINS ANAEROBIC BOTTLE ONLY CRITICAL RESULT CALLED TO, READ BACK BY AND VERIFIED WITH: A. MASTERS, PHARMD AT 1705 ON 03/28/18 BY C. JESSUP, MLT. Results Called to: A. MASTERS, PHARMD AT 0930 ON 03/29/18 BY C. JESSUP, MLT. Performed at [facility], [place] [address] [place], [place] [address]    Culture      GRAM NEGATIVE RODS CORRECTED RESULTS PREVIOUSLY REPORTED AS: GRAM POSITIVE COCCI    Report Status PENDING   Blood Culture ID Panel (Reflexed)     Status: Abnormal   Collection Time: 03/27/18  7:00 PM  Result Value Ref Range   Enterococcus species DETECTED (A) NOT DETECTED    Comment: CRITICAL RESULT CALLED TO, READ BACK BY AND VERIFIED WITH: A. MASTERS, PHARMD AT 1705 ON 03/28/18 BY C. JESSUP, MLT.    Vancomycin resistance NOT DETECTED NOT DETECTED   Listeria monocytogenes NOT DETECTED NOT DETECTED   Staphylococcus species NOT DETECTED NOT DETECTED   Staphylococcus aureus NOT DETECTED NOT DETECTED   Streptococcus species NOT DETECTED NOT DETECTED   Streptococcus agalactiae NOT DETECTED NOT DETECTED   Streptococcus  pneumoniae NOT DETECTED NOT DETECTED   Streptococcus pyogenes NOT DETECTED NOT DETECTED   Acinetobacter baumannii NOT DETECTED NOT DETECTED   Enterobacteriaceae species NOT DETECTED NOT DETECTED   Enterobacter cloacae complex NOT DETECTED NOT DETECTED   Escherichia coli NOT DETECTED NOT DETECTED   Klebsiella oxytoca NOT DETECTED NOT DETECTED   Klebsiella pneumoniae NOT DETECTED  NOT DETECTED   Proteus species NOT DETECTED NOT DETECTED   Serratia marcescens NOT DETECTED NOT DETECTED   Haemophilus influenzae NOT DETECTED NOT DETECTED   Neisseria meningitidis NOT DETECTED NOT DETECTED   Pseudomonas aeruginosa NOT DETECTED NOT DETECTED   Candida albicans NOT DETECTED NOT DETECTED   Candida glabrata NOT DETECTED NOT DETECTED   Candida krusei NOT DETECTED NOT DETECTED   Candida parapsilosis NOT DETECTED NOT DETECTED   Candida tropicalis NOT DETECTED NOT DETECTED    Comment: Performed at [facility], [place] [address] [place], [place] [address]  Comprehensive metabolic panel     Status: Abnormal   Collection Time: 03/27/18  7:23 PM  Result Value Ref Range   Sodium 137 135 - 145 mmol/L   Potassium 3.7 3.5 - 5.1 mmol/L   Chloride 106 98 - 111 mmol/L   CO2 18 (L) 22 - 32 mmol/L   Glucose, Bld 232 (H) 70 - 99 mg/dL   BUN 8 8 - 23 mg/dL   Creatinine, Ser 0.71 0.61 - 1.24 mg/dL   Calcium 8.8 (L) 8.9 - 10.3 mg/dL   Total Protein 7.2 6.5 - 8.1 g/dL   Albumin 3.3 (L) 3.5 - 5.0 g/dL   AST 276 (H) 15 - 41 U/L   ALT 259 (H) 0 - 44 U/L   Alkaline Phosphatase 340 (H) 38 - 126 U/L   Total Bilirubin 3.7 (H) 0.3 - 1.2 mg/dL   GFR calc non Af Amer >60 >60 mL/min   GFR calc Af Amer >60 >60 mL/min    Comment: (NOTE) The eGFR has been calculated using the CKD EPI equation. This calculation has not been validated in all clinical situations. eGFR's persistently <60 mL/min signify possible Chronic Kidney Disease.    Anion gap 13 5 - 15    Comment: Performed at [facility] [address], [place], [place] [address]  CBC WITH DIFFERENTIAL     Status: Abnormal   Collection Time: 03/27/18  7:23 PM  Result Value Ref Range   WBC 12.3 (H) 4.0 - 10.5 K/uL   RBC 5.33 4.22 - 5.81 MIL/uL   Hemoglobin 13.9 13.0 - 17.0 g/dL   HCT 43.1 39.0 - 52.0 %   MCV 80.9 78.0 - 100.0 fL   MCH 26.1 26.0 - 34.0 pg   MCHC 32.3 30.0 - 36.0 g/dL   RDW 17.2 (H) 11.5 - 15.5 %   Platelets  190 150 - 400 K/uL   Neutrophils Relative % 89 %   Neutro Abs 11.0 (H) 1.7 - 7.7 K/uL   Lymphocytes Relative 5 %   Lymphs Abs 0.6 (L) 0.7 - 4.0 K/uL   Monocytes Relative 5 %   Monocytes Absolute 0.6 0.1 - 1.0 K/uL   Eosinophils Relative 0 %   Eosinophils Absolute 0.0 0.0 - 0.7 K/uL   Basophils Relative 0 %   Basophils Absolute 0.0 0.0 - 0.1 K/uL   Immature Granulocytes 1 %   Abs Immature Granulocytes 0.1 0.0 - 0.1 K/uL    Comment: Performed at [facility], [address], [place], [place] [address]  Blood Culture (routine  x 2)     Status: None (Preliminary result)   Collection Time: 03/27/18  7:35 PM  Result Value Ref Range   Specimen Description SITE NOT SPECIFIED    Special Requests      BOTTLES DRAWN AEROBIC AND ANAEROBIC Blood Culture adequate volume   Culture  Setup Time      GRAM NEGATIVE RODS IN BOTH AEROBIC AND ANAEROBIC BOTTLES CRITICAL RESULT CALLED TO, READ BACK BY AND VERIFIED WITH: A. MASTERS, PHARMD AT 1310 ON 03/28/18 BY C. JESSUP, MLT. Performed at [facility], [place] [address], [place], [place] [address]    Culture GRAM NEGATIVE RODS    Report Status PENDING   I-stat troponin, ED (not at [facility], [facility])     Status: None   Collection Time: 03/27/18  7:35 PM  Result Value Ref Range   Troponin i, poc 0.08 0.00 - 0.08 ng/mL   Comment 3            Comment: Due to the release kinetics of cTnI, a negative result within the first hours of the onset of symptoms does not rule out myocardial infarction with certainty. If myocardial infarction is still suspected, repeat the test at appropriate intervals.   Blood Culture ID Panel (Reflexed)     Status: Abnormal   Collection Time: 03/27/18  7:35 PM  Result Value Ref Range   Enterococcus species NOT DETECTED NOT DETECTED   Listeria monocytogenes NOT DETECTED NOT DETECTED   Staphylococcus species NOT DETECTED NOT DETECTED   Staphylococcus aureus NOT DETECTED NOT DETECTED   Streptococcus species NOT DETECTED NOT  DETECTED   Streptococcus agalactiae NOT DETECTED NOT DETECTED   Streptococcus pneumoniae NOT DETECTED NOT DETECTED   Streptococcus pyogenes NOT DETECTED NOT DETECTED   Acinetobacter baumannii NOT DETECTED NOT DETECTED   Enterobacteriaceae species DETECTED (A) NOT DETECTED    Comment: Enterobacteriaceae represent a large family of gram-negative bacteria, not a single organism. CRITICAL RESULT CALLED TO, READ BACK BY AND VERIFIED WITH: A. MASTERS, PHARMD AT 1310 03/28/18 BY C. JESSUP, MLT.    Enterobacter cloacae complex NOT DETECTED NOT DETECTED   Escherichia coli NOT DETECTED NOT DETECTED   Klebsiella oxytoca DETECTED (A) NOT DETECTED    Comment: CRITICAL RESULT CALLED TO, READ BACK BY AND VERIFIED WITH: A. MASTERS, PHARMD AT 1310 03/28/18 BY C. JESSUP, MLT.    Klebsiella pneumoniae NOT DETECTED NOT DETECTED   Proteus species NOT DETECTED NOT DETECTED   Serratia marcescens NOT DETECTED NOT DETECTED   Carbapenem resistance NOT DETECTED NOT DETECTED   Haemophilus influenzae NOT DETECTED NOT DETECTED   Neisseria meningitidis NOT DETECTED NOT DETECTED   Pseudomonas aeruginosa NOT DETECTED NOT DETECTED   Candida albicans NOT DETECTED NOT DETECTED   Candida glabrata NOT DETECTED NOT DETECTED   Candida krusei NOT DETECTED NOT DETECTED   Candida parapsilosis NOT DETECTED NOT DETECTED   Candida tropicalis NOT DETECTED NOT DETECTED    Comment: Performed at [facility], [place] [address] [place], [place] [address]  I-Stat CG4 Lactic Acid, ED  (not at  [facility])     Status: Abnormal   Collection Time: 03/27/18  7:42 PM  Result Value Ref Range   Lactic Acid, Venous 2.87 (HH) 0.5 - 1.9 mmol/L   Comment NOTIFIED PHYSICIAN   Urinalysis, Routine w reflex microscopic     Status: Abnormal   Collection Time: 03/27/18  8:18 PM  Result Value Ref Range   Color, Urine AMBER (A) YELLOW    Comment: BIOCHEMICALS  MAY BE AFFECTED BY COLOR   APPearance CLEAR CLEAR   Specific Gravity, Urine 1.014 1.005 - 1.030    pH 5.0 5.0 - 8.0   Glucose, UA NEGATIVE NEGATIVE mg/dL   Hgb urine dipstick LARGE (A) NEGATIVE   Bilirubin Urine NEGATIVE NEGATIVE   Ketones, ur 5 (A) NEGATIVE mg/dL   Protein, ur NEGATIVE NEGATIVE mg/dL   Nitrite POSITIVE (A) NEGATIVE   Leukocytes, UA SMALL (A) NEGATIVE   RBC / HPF >50 (H) 0 - 5 RBC/hpf   WBC, UA 11-20 0 - 5 WBC/hpf   Bacteria, UA MANY (A) NONE SEEN   Mucus PRESENT    Hyaline Casts, UA PRESENT     Comment: Performed at [facility], [address] [place], [place] [address]  Urine culture     Status: Abnormal (Preliminary result)   Collection Time: 03/27/18  8:18 PM  Result Value Ref Range   Specimen Description URINE, CATHETERIZED    Special Requests      NONE Performed at [facility] [address] [place], [place] [address]    Culture >=100,000 COLONIES/mL GRAM NEGATIVE RODS (A)    Report Status PENDING   Strep pneumoniae urinary antigen     Status: None   Collection Time: 03/27/18  8:18 PM  Result Value Ref Range   Strep Pneumo Urinary Antigen NEGATIVE NEGATIVE    Comment:        Infection due to S. pneumoniae cannot be absolutely ruled out since the antigen present may be below the detection limit of the test. Performed at [facility], [place] [address] [place], [place] [address]   I-Stat Troponin, ED (not at [facility])     Status: Abnormal   Collection Time: 03/27/18 10:17 PM  Result Value Ref Range   Troponin i, poc 0.10 (HH) 0.00 - 0.08 ng/mL   Comment NOTIFIED PHYSICIAN    Comment 3            Comment: Due to the release kinetics of cTnI, a negative result within the first hours of the onset of symptoms does not rule out myocardial infarction with certainty. If myocardial infarction is still suspected, repeat the test at appropriate intervals.   I-Stat CG4 Lactic Acid, ED  (not at  [facility])     Status: Abnormal   Collection Time: 03/27/18 10:19 PM  Result Value Ref Range   Lactic Acid, Venous 2.90 (HH) 0.5 - 1.9 mmol/L    Comment NOTIFIED PHYSICIAN   HIV antibody (Routine Screening)     Status: None   Collection Time: 03/28/18  4:34 AM  Result Value Ref Range   HIV Screen 4th Generation wRfx Non Reactive Non Reactive    Comment: (NOTE) Performed At: [facility] [place], [place] [id number] Rush Farmer MD [id number]   Troponin I (q 6hr x 3)     Status: Abnormal   Collection Time: 03/28/18  4:34 AM  Result Value Ref Range   Troponin I 0.05 (HH) <0.03 ng/mL    Comment: CRITICAL RESULT CALLED TO, READ BACK BY AND VERIFIED WITH: Hoyt Koch RN 03/28/2018 0546 JORDANS Performed at [facility], [place] [address], [place], [place] [address]   CBC     Status: Abnormal   Collection Time: 03/28/18  4:34 AM  Result Value Ref Range   WBC 13.0 (H) 4.0 - 10.5 K/uL    Comment: WHITE COUNT CONFIRMED ON SMEAR   RBC 4.53 4.22 - 5.81 MIL/uL  Hemoglobin 11.9 (L) 13.0 - 17.0 g/dL   HCT 35.7 (L) 39.0 - 52.0 %   MCV 78.8 78.0 - 100.0 fL   MCH 26.3 26.0 - 34.0 pg   MCHC 33.3 30.0 - 36.0 g/dL   RDW 16.5 (H) 11.5 - 15.5 %   Platelets 162 150 - 400 K/uL    Comment: Performed at [facility] [address], [place], [place] [address]  Comprehensive metabolic panel     Status: Abnormal   Collection Time: 03/28/18  4:34 AM  Result Value Ref Range   Sodium 137 135 - 145 mmol/L   Potassium 3.7 3.5 - 5.1 mmol/L   Chloride 107 98 - 111 mmol/L   CO2 21 (L) 22 - 32 mmol/L   Glucose, Bld 137 (H) 70 - 99 mg/dL   BUN 10 8 - 23 mg/dL   Creatinine, Ser 0.71 0.61 - 1.24 mg/dL   Calcium 8.3 (L) 8.9 - 10.3 mg/dL   Total Protein 6.3 (L) 6.5 - 8.1 g/dL   Albumin 2.7 (L) 3.5 - 5.0 g/dL   AST 156 (H) 15 - 41 U/L   ALT 196 (H) 0 - 44 U/L   Alkaline Phosphatase 259 (H) 38 - 126 U/L   Total Bilirubin 4.7 (H) 0.3 - 1.2 mg/dL   GFR calc non Af Amer >60 >60 mL/min   GFR calc Af Amer >60 >60 mL/min    Comment: (NOTE) The eGFR has been calculated using the CKD EPI equation. This calculation  has not been validated in all clinical situations. eGFR's persistently <60 mL/min signify possible Chronic Kidney Disease.    Anion gap 9 5 - 15    Comment: Performed at [facility] [address] [place], [place] [address]  CK total and CKMB (cardiac)not at [facility]     Status: Abnormal   Collection Time: 03/28/18  4:34 AM  Result Value Ref Range   Total CK 26 (L) 49 - 397 U/L   CK, MB 0.7 0.5 - 5.0 ng/mL   Relative Index RELATIVE INDEX IS INVALID 0.0 - 2.5    Comment: WHEN CK < 100 U/L        Performed at [facility] [address] [place], [place] [address]   Heparin level (unfractionated)     Status: None   Collection Time: 03/28/18  7:32 AM  Result Value Ref Range   Heparin Unfractionated 0.31 0.30 - 0.70 IU/mL    Comment: (NOTE) If heparin results are below expected values, and patient dosage has  been confirmed, suggest follow up testing of antithrombin III levels. Performed at [facility], [place] [address] [place], [place] [address]   Troponin I (q 6hr x 3)     Status: Abnormal   Collection Time: 03/28/18  7:32 AM  Result Value Ref Range   Troponin I 0.04 (HH) <0.03 ng/mL    Comment: CRITICAL VALUE NOTED.  VALUE IS CONSISTENT WITH PREVIOUSLY REPORTED AND CALLED VALUE. Performed at [facility], [place] [address], [place], [place] [address]   Troponin I (q 6hr x 3)     Status: Abnormal   Collection Time: 03/28/18  3:12 PM  Result Value Ref Range   Troponin I 0.03 (HH) <0.03 ng/mL    Comment: CRITICAL RESULT CALLED TO, READ BACK BY AND VERIFIED WITH: Crecencio Mc RN @ 1702 ON 03/28/18 BY HTEMOCHE Performed at [facility], [place] [address], [place], [place] [address]  CBC     Status: Abnormal   Collection Time: 03/29/18  4:26 AM  Result Value Ref Range   WBC 7.0 4.0 - 10.5 K/uL   RBC 4.48 4.22 - 5.81 MIL/uL   Hemoglobin 11.7 (L) 13.0 - 17.0 g/dL   HCT 35.3 (L) 39.0 - 52.0 %   MCV 78.8 78.0 - 100.0 fL   MCH 26.1 26.0 - 34.0 pg   MCHC  33.1 30.0 - 36.0 g/dL   RDW 17.1 (H) 11.5 - 15.5 %   Platelets 161 150 - 400 K/uL    Comment: Performed at [facility] [address] [place], [place] [address]  Comprehensive metabolic panel     Status: Abnormal   Collection Time: 03/29/18  4:26 AM  Result Value Ref Range   Sodium 138 135 - 145 mmol/L   Potassium 3.8 3.5 - 5.1 mmol/L   Chloride 107 98 - 111 mmol/L   CO2 22 22 - 32 mmol/L   Glucose, Bld 139 (H) 70 - 99 mg/dL   BUN 12 8 - 23 mg/dL   Creatinine, Ser 0.73 0.61 - 1.24 mg/dL   Calcium 8.2 (L) 8.9 - 10.3 mg/dL   Total Protein 6.3 (L) 6.5 - 8.1 g/dL   Albumin 2.6 (L) 3.5 - 5.0 g/dL   AST 84 (H) 15 - 41 U/L   ALT 143 (H) 0 - 44 U/L   Alkaline Phosphatase 219 (H) 38 - 126 U/L   Total Bilirubin 2.8 (H) 0.3 - 1.2 mg/dL   GFR calc non Af Amer >60 >60 mL/min   GFR calc Af Amer >60 >60 mL/min    Comment: (NOTE) The eGFR has been calculated using the CKD EPI equation. This calculation has not been validated in all clinical situations. eGFR's persistently <60 mL/min signify possible Chronic Kidney Disease.    Anion gap 9 5 - 15    Comment: Performed at [facility] [address] [place], [place] [address]      Component Value Date/Time   SDES URINE, CATHETERIZED 03/27/2018 2018   SPECREQUEST  03/27/2018 2018    NONE Performed at [facility], [place] [address], [place], [place] [address]    CULT >=100,000 COLONIES/mL GRAM NEGATIVE RODS (A) 03/27/2018 2018   REPTSTATUS PENDING 03/27/2018 2018   Dg Chest Port 1 View  Result Date: 03/27/2018 CLINICAL DATA:  Altered level of consciousness.  Fevers.  Sepsis. EXAM: PORTABLE CHEST 1 VIEW COMPARISON:  One-view chest x-ray 10/30/2017. FINDINGS: The heart is enlarged. Asymmetric left lower lobe airspace disease is present. No definite effusions are present. The right lung is clear. Atherosclerotic calcifications are present at the aortic arch. The visualized soft tissues and bony thorax are unremarkable.  IMPRESSION: 1. Asymmetric left lower lobe airspace disease is concerning for pneumonia. 2. Aortic atherosclerosis. Electronically Signed   By: San Morelle M.D.   On: 03/27/2018 20:34   US Abdomen Limited Ruq  Result Date: 03/28/2018 CLINICAL DATA:  Elevated liver function tests. Prior cholecystectomy. EXAM: ULTRASOUND ABDOMEN LIMITED RIGHT UPPER QUADRANT COMPARISON:  None. FINDINGS: Gallbladder: Surgically absent. Common bile duct: Diameter: 3 mm, within normal limits. Liver: 1.2 cm benign-appearing cyst in right hepatic lobe. No liver mass identified. Within normal limits in parenchymal echogenicity. Portal vein is patent on color Doppler imaging with normal direction of blood flow towards the liver. IMPRESSION: Prior cholecystectomy.  No evidence of biliary ductal dilatation. Small benign right hepatic lobe cyst. Electronically Signed   By: Jenny Reichmann  Kris Hartmann M.D.   On: 03/28/2018 11:27   Recent Results (from the past 240 hour(s))  Blood Culture (routine x 2)     Status: None (Preliminary result)   Collection Time: 03/27/18  7:00 PM  Result Value Ref Range Status   Specimen Description BLOOD RIGHT WRIST  Final   Special Requests   Final    BOTTLES DRAWN AEROBIC AND ANAEROBIC Blood Culture results may not be optimal due to an inadequate volume of blood received in culture bottles   Culture  Setup Time   Final    GRAM NEGATIVE RODS AEROBIC BOTTLE ONLY CRITICAL RESULT CALLED TO, READ BACK BY AND VERIFIED WITH: A. MASTERS, PHARMD AT 1310 ON 03/28/18 BY C. JESSUP, MLT. CORRECTED RESULTS PREVIOUSLY REPORTED AS: GRAM POSITIVE COCCI IN PAIRS AND CHAINS ANAEROBIC BOTTLE ONLY CRITICAL RESULT CALLED TO, READ BACK BY AND VERIFIED WITH: A. MASTERS, PHARMD AT 1705 ON 03/28/18 BY C. JESSUP, MLT. Results Called to: A. MASTERS, PHARMD AT 0930 ON 03/29/18 BY C. JESSUP, MLT. Performed at [facility], [place] [address], [place], [place] [address]    Culture   Final    Lonell Grandchild NEGATIVE RODS CORRECTED RESULTS  PREVIOUSLY REPORTED AS: GRAM POSITIVE COCCI    Report Status PENDING  Incomplete  Blood Culture ID Panel (Reflexed)     Status: Abnormal   Collection Time: 03/27/18  7:00 PM  Result Value Ref Range Status   Enterococcus species DETECTED (A) NOT DETECTED Final    Comment: CRITICAL RESULT CALLED TO, READ BACK BY AND VERIFIED WITH: A. MASTERS, PHARMD AT 1705 ON 03/28/18 BY C. JESSUP, MLT.    Vancomycin resistance NOT DETECTED NOT DETECTED Final   Listeria monocytogenes NOT DETECTED NOT DETECTED Final   Staphylococcus species NOT DETECTED NOT DETECTED Final   Staphylococcus aureus NOT DETECTED NOT DETECTED Final   Streptococcus species NOT DETECTED NOT DETECTED Final   Streptococcus agalactiae NOT DETECTED NOT DETECTED Final   Streptococcus pneumoniae NOT DETECTED NOT DETECTED Final   Streptococcus pyogenes NOT DETECTED NOT DETECTED Final   Acinetobacter baumannii NOT DETECTED NOT DETECTED Final   Enterobacteriaceae species NOT DETECTED NOT DETECTED Final   Enterobacter cloacae complex NOT DETECTED NOT DETECTED Final   Escherichia coli NOT DETECTED NOT DETECTED Final   Klebsiella oxytoca NOT DETECTED NOT DETECTED Final   Klebsiella pneumoniae NOT DETECTED NOT DETECTED Final   Proteus species NOT DETECTED NOT DETECTED Final   Serratia marcescens NOT DETECTED NOT DETECTED Final   Haemophilus influenzae NOT DETECTED NOT DETECTED Final   Neisseria meningitidis NOT DETECTED NOT DETECTED Final   Pseudomonas aeruginosa NOT DETECTED NOT DETECTED Final   Candida albicans NOT DETECTED NOT DETECTED Final   Candida glabrata NOT DETECTED NOT DETECTED Final   Candida krusei NOT DETECTED NOT DETECTED Final   Candida parapsilosis NOT DETECTED NOT DETECTED Final   Candida tropicalis NOT DETECTED NOT DETECTED Final    Comment: Performed at [facility], [place] [address] [place], [place] [address]  Blood Culture (routine x 2)     Status: None (Preliminary result)   Collection Time: 03/27/18  7:35 PM    Result Value Ref Range Status   Specimen Description SITE NOT SPECIFIED  Final   Special Requests   Final    BOTTLES DRAWN AEROBIC AND ANAEROBIC Blood Culture adequate volume   Culture  Setup Time   Final    GRAM NEGATIVE RODS IN BOTH AEROBIC AND ANAEROBIC BOTTLES CRITICAL RESULT CALLED TO, READ BACK BY AND  VERIFIED WITH: A. MASTERS, PHARMD AT 1310 ON 03/28/18 BY C. JESSUP, MLT. Performed at [facility], [place] [address] [place], [place] [address]    Culture GRAM NEGATIVE RODS  Final   Report Status PENDING  Incomplete  Blood Culture ID Panel (Reflexed)     Status: Abnormal   Collection Time: 03/27/18  7:35 PM  Result Value Ref Range Status   Enterococcus species NOT DETECTED NOT DETECTED Final   Listeria monocytogenes NOT DETECTED NOT DETECTED Final   Staphylococcus species NOT DETECTED NOT DETECTED Final   Staphylococcus aureus NOT DETECTED NOT DETECTED Final   Streptococcus species NOT DETECTED NOT DETECTED Final   Streptococcus agalactiae NOT DETECTED NOT DETECTED Final   Streptococcus pneumoniae NOT DETECTED NOT DETECTED Final   Streptococcus pyogenes NOT DETECTED NOT DETECTED Final   Acinetobacter baumannii NOT DETECTED NOT DETECTED Final   Enterobacteriaceae species DETECTED (A) NOT DETECTED Final    Comment: Enterobacteriaceae represent a large family of gram-negative bacteria, not a single organism. CRITICAL RESULT CALLED TO, READ BACK BY AND VERIFIED WITH: A. MASTERS, PHARMD AT 1310 03/28/18 BY C. JESSUP, MLT.    Enterobacter cloacae complex NOT DETECTED NOT DETECTED Final   Escherichia coli NOT DETECTED NOT DETECTED Final   Klebsiella oxytoca DETECTED (A) NOT DETECTED Final    Comment: CRITICAL RESULT CALLED TO, READ BACK BY AND VERIFIED WITH: A. MASTERS, PHARMD AT 1310 03/28/18 BY C. JESSUP, MLT.    Klebsiella pneumoniae NOT DETECTED NOT DETECTED Final   Proteus species NOT DETECTED NOT DETECTED Final   Serratia marcescens NOT DETECTED NOT DETECTED Final    Carbapenem resistance NOT DETECTED NOT DETECTED Final   Haemophilus influenzae NOT DETECTED NOT DETECTED Final   Neisseria meningitidis NOT DETECTED NOT DETECTED Final   Pseudomonas aeruginosa NOT DETECTED NOT DETECTED Final   Candida albicans NOT DETECTED NOT DETECTED Final   Candida glabrata NOT DETECTED NOT DETECTED Final   Candida krusei NOT DETECTED NOT DETECTED Final   Candida parapsilosis NOT DETECTED NOT DETECTED Final   Candida tropicalis NOT DETECTED NOT DETECTED Final    Comment: Performed at [facility], [place] [address], [place], [place] [address]  Urine culture     Status: Abnormal (Preliminary result)   Collection Time: 03/27/18  8:18 PM  Result Value Ref Range Status   Specimen Description URINE, CATHETERIZED  Final   Special Requests   Final    NONE Performed at [facility], [place] [address], [place], [place] [address]    Culture >=100,000 COLONIES/mL GRAM NEGATIVE RODS (A)  Final   Report Status PENDING  Incomplete    Microbiology: Recent Results (from the past 240 hour(s))  Blood Culture (routine x 2)     Status: None (Preliminary result)   Collection Time: 03/27/18  7:00 PM  Result Value Ref Range Status   Specimen Description BLOOD RIGHT WRIST  Final   Special Requests   Final    BOTTLES DRAWN AEROBIC AND ANAEROBIC Blood Culture results may not be optimal due to an inadequate volume of blood received in culture bottles   Culture  Setup Time   Final    GRAM NEGATIVE RODS AEROBIC BOTTLE ONLY CRITICAL RESULT CALLED TO, READ BACK BY AND VERIFIED WITH: A. MASTERS, PHARMD AT 1310 ON 03/28/18 BY C. JESSUP, MLT. CORRECTED RESULTS PREVIOUSLY REPORTED AS: GRAM POSITIVE COCCI IN PAIRS AND CHAINS ANAEROBIC BOTTLE ONLY CRITICAL RESULT CALLED TO, READ BACK BY AND VERIFIED WITH: A. MASTERS, PHARMD AT 1705  ON 03/28/18 BY C. JESSUP, MLT. Results Called to: A. MASTERS, PHARMD AT 0930 ON 03/29/18 BY C. JESSUP, MLT. Performed at [facility], [place] [address]  [place], [place] [address]    Culture   Final    Lonell Grandchild NEGATIVE RODS CORRECTED RESULTS PREVIOUSLY REPORTED AS: GRAM POSITIVE COCCI    Report Status PENDING  Incomplete  Blood Culture ID Panel (Reflexed)     Status: Abnormal   Collection Time: 03/27/18  7:00 PM  Result Value Ref Range Status   Enterococcus species DETECTED (A) NOT DETECTED Final    Comment: CRITICAL RESULT CALLED TO, READ BACK BY AND VERIFIED WITH: A. MASTERS, PHARMD AT 1705 ON 03/28/18 BY C. JESSUP, MLT.    Vancomycin resistance NOT DETECTED NOT DETECTED Final   Listeria monocytogenes NOT DETECTED NOT DETECTED Final   Staphylococcus species NOT DETECTED NOT DETECTED Final   Staphylococcus aureus NOT DETECTED NOT DETECTED Final   Streptococcus species NOT DETECTED NOT DETECTED Final   Streptococcus agalactiae NOT DETECTED NOT DETECTED Final   Streptococcus pneumoniae NOT DETECTED NOT DETECTED Final   Streptococcus pyogenes NOT DETECTED NOT DETECTED Final   Acinetobacter baumannii NOT DETECTED NOT DETECTED Final   Enterobacteriaceae species NOT DETECTED NOT DETECTED Final   Enterobacter cloacae complex NOT DETECTED NOT DETECTED Final   Escherichia coli NOT DETECTED NOT DETECTED Final   Klebsiella oxytoca NOT DETECTED NOT DETECTED Final   Klebsiella pneumoniae NOT DETECTED NOT DETECTED Final   Proteus species NOT DETECTED NOT DETECTED Final   Serratia marcescens NOT DETECTED NOT DETECTED Final   Haemophilus influenzae NOT DETECTED NOT DETECTED Final   Neisseria meningitidis NOT DETECTED NOT DETECTED Final   Pseudomonas aeruginosa NOT DETECTED NOT DETECTED Final   Candida albicans NOT DETECTED NOT DETECTED Final   Candida glabrata NOT DETECTED NOT DETECTED Final   Candida krusei NOT DETECTED NOT DETECTED Final   Candida parapsilosis NOT DETECTED NOT DETECTED Final   Candida tropicalis NOT DETECTED NOT DETECTED Final    Comment: Performed at [facility], [place] [address], [place], [place] [address]  Blood Culture  (routine x 2)     Status: None (Preliminary result)   Collection Time: 03/27/18  7:35 PM  Result Value Ref Range Status   Specimen Description SITE NOT SPECIFIED  Final   Special Requests   Final    BOTTLES DRAWN AEROBIC AND ANAEROBIC Blood Culture adequate volume   Culture  Setup Time   Final    GRAM NEGATIVE RODS IN BOTH AEROBIC AND ANAEROBIC BOTTLES CRITICAL RESULT CALLED TO, READ BACK BY AND VERIFIED WITH: A. MASTERS, PHARMD AT 1310 ON 03/28/18 BY C. JESSUP, MLT. Performed at [facility], [place] [address], [place], [place] [address]    Culture GRAM NEGATIVE RODS  Final   Report Status PENDING  Incomplete  Blood Culture ID Panel (Reflexed)     Status: Abnormal   Collection Time: 03/27/18  7:35 PM  Result Value Ref Range Status   Enterococcus species NOT DETECTED NOT DETECTED Final   Listeria monocytogenes NOT DETECTED NOT DETECTED Final   Staphylococcus species NOT DETECTED NOT DETECTED Final   Staphylococcus aureus NOT DETECTED NOT DETECTED Final   Streptococcus species NOT DETECTED NOT DETECTED Final   Streptococcus agalactiae NOT DETECTED NOT DETECTED Final   Streptococcus pneumoniae NOT DETECTED NOT DETECTED Final   Streptococcus pyogenes NOT DETECTED NOT DETECTED Final   Acinetobacter baumannii NOT DETECTED NOT DETECTED Final   Enterobacteriaceae species DETECTED (A) NOT  DETECTED Final    Comment: Enterobacteriaceae represent a large family of gram-negative bacteria, not a single organism. CRITICAL RESULT CALLED TO, READ BACK BY AND VERIFIED WITH: A. MASTERS, PHARMD AT 1310 03/28/18 BY C. JESSUP, MLT.    Enterobacter cloacae complex NOT DETECTED NOT DETECTED Final   Escherichia coli NOT DETECTED NOT DETECTED Final   Klebsiella oxytoca DETECTED (A) NOT DETECTED Final    Comment: CRITICAL RESULT CALLED TO, READ BACK BY AND VERIFIED WITH: A. MASTERS, PHARMD AT 1310 03/28/18 BY C. JESSUP, MLT.    Klebsiella pneumoniae NOT DETECTED NOT DETECTED Final   Proteus species NOT  DETECTED NOT DETECTED Final   Serratia marcescens NOT DETECTED NOT DETECTED Final   Carbapenem resistance NOT DETECTED NOT DETECTED Final   Haemophilus influenzae NOT DETECTED NOT DETECTED Final   Neisseria meningitidis NOT DETECTED NOT DETECTED Final   Pseudomonas aeruginosa NOT DETECTED NOT DETECTED Final   Candida albicans NOT DETECTED NOT DETECTED Final   Candida glabrata NOT DETECTED NOT DETECTED Final   Candida krusei NOT DETECTED NOT DETECTED Final   Candida parapsilosis NOT DETECTED NOT DETECTED Final   Candida tropicalis NOT DETECTED NOT DETECTED Final    Comment: Performed at [facility], [place] [address], [place], [place] [address]  Urine culture     Status: Abnormal (Preliminary result)   Collection Time: 03/27/18  8:18 PM  Result Value Ref Range Status   Specimen Description URINE, CATHETERIZED  Final   Special Requests   Final    NONE Performed at [facility], [place] [address] [place], [place] [address]    Culture >=100,000 COLONIES/mL GRAM NEGATIVE RODS (A)  Final   Report Status PENDING  Incomplete    Radiographs and labs were personally reviewed by me.   Bobby Rumpf, MD [facility] [phone] 03/29/2018, 11:46 AM

## 2018-03-29 NOTE — Progress Notes (Signed)
Temp 101.5 orally. Order to call if Temp. >100.5 . X [facility] N.P. Text page and call return. Apply ice packs to patient.Patient is not candidate for Tylenol per X. [facility] N.P.

## 2018-03-30 DIAGNOSIS — A4181 Sepsis due to Enterococcus: Secondary | ICD-10-CM

## 2018-03-30 LAB — COMPREHENSIVE METABOLIC PANEL
ALK PHOS: 197 U/L — AB (ref 38–126)
ALT: 99 U/L — AB (ref 0–44)
AST: 59 U/L — ABNORMAL HIGH (ref 15–41)
Albumin: 2.4 g/dL — ABNORMAL LOW (ref 3.5–5.0)
Anion gap: 10 (ref 5–15)
BUN: 10 mg/dL (ref 8–23)
CALCIUM: 8.2 mg/dL — AB (ref 8.9–10.3)
CO2: 18 mmol/L — ABNORMAL LOW (ref 22–32)
CREATININE: 0.66 mg/dL (ref 0.61–1.24)
Chloride: 109 mmol/L (ref 98–111)
Glucose, Bld: 281 mg/dL — ABNORMAL HIGH (ref 70–99)
Potassium: 3.7 mmol/L (ref 3.5–5.1)
Sodium: 137 mmol/L (ref 135–145)
Total Bilirubin: 1.1 mg/dL (ref 0.3–1.2)
Total Protein: 5.8 g/dL — ABNORMAL LOW (ref 6.5–8.1)

## 2018-03-30 LAB — CBC
HCT: 34.6 % — ABNORMAL LOW (ref 39.0–52.0)
HEMOGLOBIN: 11.2 g/dL — AB (ref 13.0–17.0)
MCH: 26.4 pg (ref 26.0–34.0)
MCHC: 32.4 g/dL (ref 30.0–36.0)
MCV: 81.6 fL (ref 78.0–100.0)
Platelets: 143 10*3/uL — ABNORMAL LOW (ref 150–400)
RBC: 4.24 MIL/uL (ref 4.22–5.81)
RDW: 17.2 % — ABNORMAL HIGH (ref 11.5–15.5)
WBC: 4.6 10*3/uL (ref 4.0–10.5)

## 2018-03-30 LAB — URINE CULTURE: Culture: >=100000 — AB

## 2018-03-30 LAB — HEPATITIS PANEL, ACUTE
HCV Ab: <0.1 s/co ratio (ref 0.0–0.9)
Hep A IgM: NEGATIVE
Hep B C IgM: NEGATIVE
Hepatitis B Surface Ag: NEGATIVE

## 2018-03-30 MED ORDER — PIPERACILLIN-TAZOBACTAM 3.375 G IVPB
3.3750 g | Freq: Three times a day (TID) | INTRAVENOUS | Status: DC
  Administered 2018-03-30 – 2018-04-02 (×9): 3.375 g via INTRAVENOUS
  Filled 2018-03-30 (×10): qty 50

## 2018-03-30 NOTE — Progress Notes (Addendum)
[facility]  PROGRESS NOTE  Jaime [name] QAS:[id number] DOB: [birth year] DOA: 03/27/2018 PCP: [name], MD   Brief HPI:   77 year old male with history of multiple sclerosis, hypertension, hyperlipidemia, type 2 diabetes mellitus, CAD status post DES, PVD, pulmonary hypertension, CHF EF 30 to 35% came with complaints of dysuria and fever.  Patient was started on empiric antibiotics for sepsis due to UTI.  Also had elevated troponin, cardiology was consulted.   Subjective   Patient seen and examined, denies any complaints.  Blood cultures growing enterococcus, urine culture growing Pseudomonas.  Antibiotics changed to cefepime yesterday.  ID has been consulted.   Assessment/Plan:     1. Klebsiella, enterococcus bacteremia-blood cultures growing Klebsiella, enterococcus.  Urine culture is growing Pseudomonas.  Antibiotics have been changed to vancomycin, cefepime.  Follow blood and urine culture results.  ID following.  No TEE recommended.  2. Sepsis due to UTI-urine culture growing Pseudomonas, antibiotics changed to cefepime.  WBC down to 7000  3. Elevated troponin-cardiology was consulted, no further intervention as per cardiology.  Troponin 0.04, likely from demand ischemia from sepsis.  4. Diabetes mellitus-sliding scale insulin NovoLog.  Blood glucose well controlled.  5. Transaminitis-LFTs are improving, likely from shock liver.  Abdominal ultrasound showed no acute abnormality.  Hepatitis panel is negative  6. Hypertension-blood pressure stable, continue lisinopril     CBG: No results for input(s): GLUCAP in the last 168 hours.  CBC: Recent Labs  Lab 03/27/18 1923 03/28/18 0434 03/29/18 0426 03/30/18 0941  WBC 12.3* 13.0* 7.0 4.6  NEUTROABS 11.0*  --   --   --   HGB 13.9 11.9* 11.7* 11.2*  HCT 43.1 35.7* 35.3* 34.6*  MCV 80.9 78.8 78.8 81.6  PLT 190 162 161 143*    Basic Metabolic Panel: Recent Labs  Lab 03/27/18 1923 03/28/18 0434  03/29/18 0426 03/30/18 0941  NA 137 137 138 137  K 3.7 3.7 3.8 3.7  CL 106 107 107 109  CO2 18* 21* 22 18*  GLUCOSE 232* 137* 139* 281*  BUN 8 10 12 10   CREATININE 0.71 0.71 0.73 0.66  CALCIUM 8.8* 8.3* 8.2* 8.2*     DVT prophylaxis: SCDs  Code Status: Full code  Family Communication: No family at bedside  Disposition Plan: To be decided   Consultants:  Cardiology  Procedures:  None   Antibiotics:   Anti-infectives (From admission, onward)   Start     Dose/Rate Route Frequency Ordered Stop   03/30/18 1400  piperacillin-tazobactam (ZOSYN) IVPB 3.375 g     3.375 g 12.5 mL/hr over 240 Minutes Intravenous Every 8 hours 03/30/18 1015     03/29/18 1600  ceFEPIme (MAXIPIME) 2 g in sodium chloride 0.9 % 100 mL IVPB  Status:  Discontinued     2 g 200 mL/hr over 30 Minutes Intravenous Every 8 hours 03/29/18 1500 03/30/18 1013   03/28/18 2200  vancomycin (VANCOCIN) IVPB 1000 mg/200 mL premix  Status:  Discontinued     1,000 mg 200 mL/hr over 60 Minutes Intravenous Every 12 hours 03/28/18 1725 03/30/18 1013   03/28/18 1430  cefTRIAXone (ROCEPHIN) 2 g in sodium chloride 0.9 % 100 mL IVPB  Status:  Discontinued     2 g 200 mL/hr over 30 Minutes Intravenous Every 24 hours 03/28/18 1331 03/29/18 1456   03/28/18 0900  vancomycin (VANCOCIN) IVPB 1000 mg/200 mL premix  Status:  Discontinued     1,000 mg 200 mL/hr over 60 Minutes Intravenous Every 12 hours 03/27/18  2100 03/28/18 1332   03/28/18 0600  ceFEPIme (MAXIPIME) 1 g in sodium chloride 0.9 % 100 mL IVPB  Status:  Discontinued     1 g 200 mL/hr over 30 Minutes Intravenous Every 8 hours 03/28/18 0317 03/28/18 0318   03/28/18 0400  ceFEPIme (MAXIPIME) 2 g in sodium chloride 0.9 % 100 mL IVPB  Status:  Discontinued     2 g 200 mL/hr over 30 Minutes Intravenous Every 8 hours 03/27/18 2100 03/28/18 1331   03/27/18 1930  ceFEPIme (MAXIPIME) 2 g in sodium chloride 0.9 % 100 mL IVPB     2 g 200 mL/hr over 30 Minutes Intravenous   Once 03/27/18 1925 03/27/18 2015   03/27/18 1930  metroNIDAZOLE (FLAGYL) IVPB 500 mg  Status:  Discontinued     500 mg 100 mL/hr over 60 Minutes Intravenous Every 8 hours 03/27/18 1925 03/28/18 0318   03/27/18 1930  vancomycin (VANCOCIN) IVPB 1000 mg/200 mL premix     1,000 mg 200 mL/hr over 60 Minutes Intravenous  Once 03/27/18 1925 03/27/18 2116       Objective   Vitals:   03/29/18 2000 03/30/18 0000 03/30/18 0400 03/30/18 0800  BP: 118/72 (!) 142/76 (!) 144/79   Pulse: 70     Resp: (!) 21 18 20    Temp: (!) 97.5 F (36.4 C) (!) 97.5 F (36.4 C) 99 F (37.2 C) 98.7 F (37.1 C)  TempSrc: Oral Oral Oral   SpO2: 100% 96% 100% 98%  Weight:   65.1 kg   Height:        Intake/Output Summary (Last 24 hours) at 03/30/2018 1303 Last data filed at 03/30/2018 0900 Gross per 24 hour  Intake 1802.68 ml  Output 1025 ml  Net 777.68 ml   Filed Weights   03/28/18 0010 03/29/18 0520 03/30/18 0400  Weight: 63.7 kg 66.1 kg 65.1 kg     Physical Examination:    Eyes: No icterus, extraocular muscles intact  Mouth: Oral mucosa is moist, no lesions on palate,  Neck: Supple, no deformities, masses, or tenderness Lungs: Normal respiratory effort, bilateral clear to auscultation, no crackles or wheezes.  Heart: Regular rate and rhythm, S1 and S2 normal, no murmurs, rubs auscultated Abdomen: BS normoactive,soft,nondistended,non-tender to palpation,no organomegaly Extremities: No pretibial edema, bilateral AKA Neuro : Alert and oriented to time, place and person, No focal deficits Skin: No rashes seen on exam     Data Reviewed: I have personally reviewed following labs and imaging studies   Recent Results (from the past 240 hour(s))  Blood Culture (routine x 2)     Status: None (Preliminary result)   Collection Time: 03/27/18  7:00 PM  Result Value Ref Range Status   Specimen Description BLOOD RIGHT WRIST  Final   Special Requests   Final    BOTTLES DRAWN AEROBIC AND ANAEROBIC Blood  Culture results may not be optimal due to an inadequate volume of blood received in culture bottles   Culture  Setup Time   Final    GRAM NEGATIVE RODS AEROBIC BOTTLE ONLY CRITICAL RESULT CALLED TO, READ BACK BY AND VERIFIED WITH: A. MASTERS, PHARMD AT 1310 ON 03/28/18 BY C. JESSUP, MLT. CORRECTED RESULTS PREVIOUSLY REPORTED AS: GRAM POSITIVE COCCI IN PAIRS AND CHAINS ANAEROBIC BOTTLE ONLY CRITICAL RESULT CALLED TO, READ BACK BY AND VERIFIED WITH: A. MASTERS, PHARMD AT 1705 ON 03/28/18 BY C. JESSUP, MLT. Results Called to: A. MASTERS, PHARMD AT 0930 ON 03/29/18 BY C. JESSUP, MLT. Performed at [facility]  Lab, [address] [place], [place] [address]    Culture   Final    Lonell Grandchild NEGATIVE RODS CORRECTED RESULTS PREVIOUSLY REPORTED AS: GRAM POSITIVE COCCI    Report Status PENDING  Incomplete  Blood Culture ID Panel (Reflexed)     Status: Abnormal   Collection Time: 03/27/18  7:00 PM  Result Value Ref Range Status   Enterococcus species DETECTED (A) NOT DETECTED Final    Comment: CRITICAL RESULT CALLED TO, READ BACK BY AND VERIFIED WITH: A. MASTERS, PHARMD AT 1705 ON 03/28/18 BY C. JESSUP, MLT.    Vancomycin resistance NOT DETECTED NOT DETECTED Final   Listeria monocytogenes NOT DETECTED NOT DETECTED Final   Staphylococcus species NOT DETECTED NOT DETECTED Final   Staphylococcus aureus NOT DETECTED NOT DETECTED Final   Streptococcus species NOT DETECTED NOT DETECTED Final   Streptococcus agalactiae NOT DETECTED NOT DETECTED Final   Streptococcus pneumoniae NOT DETECTED NOT DETECTED Final   Streptococcus pyogenes NOT DETECTED NOT DETECTED Final   Acinetobacter baumannii NOT DETECTED NOT DETECTED Final   Enterobacteriaceae species NOT DETECTED NOT DETECTED Final   Enterobacter cloacae complex NOT DETECTED NOT DETECTED Final   Escherichia coli NOT DETECTED NOT DETECTED Final   Klebsiella oxytoca NOT DETECTED NOT DETECTED Final   Klebsiella pneumoniae NOT DETECTED NOT DETECTED Final   Proteus  species NOT DETECTED NOT DETECTED Final   Serratia marcescens NOT DETECTED NOT DETECTED Final   Haemophilus influenzae NOT DETECTED NOT DETECTED Final   Neisseria meningitidis NOT DETECTED NOT DETECTED Final   Pseudomonas aeruginosa NOT DETECTED NOT DETECTED Final   Candida albicans NOT DETECTED NOT DETECTED Final   Candida glabrata NOT DETECTED NOT DETECTED Final   Candida krusei NOT DETECTED NOT DETECTED Final   Candida parapsilosis NOT DETECTED NOT DETECTED Final   Candida tropicalis NOT DETECTED NOT DETECTED Final    Comment: Performed at [facility], [place] [address], [place], [place] [address]  Blood Culture (routine x 2)     Status: None (Preliminary result)   Collection Time: 03/27/18  7:35 PM  Result Value Ref Range Status   Specimen Description SITE NOT SPECIFIED  Final   Special Requests   Final    BOTTLES DRAWN AEROBIC AND ANAEROBIC Blood Culture adequate volume   Culture  Setup Time   Final    GRAM NEGATIVE RODS IN BOTH AEROBIC AND ANAEROBIC BOTTLES CRITICAL RESULT CALLED TO, READ BACK BY AND VERIFIED WITH: A. MASTERS, PHARMD AT 1310 ON 03/28/18 BY C. JESSUP, MLT. Performed at [facility], [place] [address], [place], [place] [address]    Culture GRAM NEGATIVE RODS  Final   Report Status PENDING  Incomplete  Blood Culture ID Panel (Reflexed)     Status: Abnormal   Collection Time: 03/27/18  7:35 PM  Result Value Ref Range Status   Enterococcus species NOT DETECTED NOT DETECTED Final   Listeria monocytogenes NOT DETECTED NOT DETECTED Final   Staphylococcus species NOT DETECTED NOT DETECTED Final   Staphylococcus aureus NOT DETECTED NOT DETECTED Final   Streptococcus species NOT DETECTED NOT DETECTED Final   Streptococcus agalactiae NOT DETECTED NOT DETECTED Final   Streptococcus pneumoniae NOT DETECTED NOT DETECTED Final   Streptococcus pyogenes NOT DETECTED NOT DETECTED Final   Acinetobacter baumannii NOT DETECTED NOT DETECTED Final   Enterobacteriaceae species  DETECTED (A) NOT DETECTED Final    Comment: Enterobacteriaceae represent a large family of gram-negative bacteria, not a single organism. CRITICAL RESULT CALLED TO, READ BACK BY  AND VERIFIED WITH: A. MASTERS, PHARMD AT 1310 03/28/18 BY C. JESSUP, MLT.    Enterobacter cloacae complex NOT DETECTED NOT DETECTED Final   Escherichia coli NOT DETECTED NOT DETECTED Final   Klebsiella oxytoca DETECTED (A) NOT DETECTED Final    Comment: CRITICAL RESULT CALLED TO, READ BACK BY AND VERIFIED WITH: A. MASTERS, PHARMD AT 1310 03/28/18 BY C. JESSUP, MLT.    Klebsiella pneumoniae NOT DETECTED NOT DETECTED Final   Proteus species NOT DETECTED NOT DETECTED Final   Serratia marcescens NOT DETECTED NOT DETECTED Final   Carbapenem resistance NOT DETECTED NOT DETECTED Final   Haemophilus influenzae NOT DETECTED NOT DETECTED Final   Neisseria meningitidis NOT DETECTED NOT DETECTED Final   Pseudomonas aeruginosa NOT DETECTED NOT DETECTED Final   Candida albicans NOT DETECTED NOT DETECTED Final   Candida glabrata NOT DETECTED NOT DETECTED Final   Candida krusei NOT DETECTED NOT DETECTED Final   Candida parapsilosis NOT DETECTED NOT DETECTED Final   Candida tropicalis NOT DETECTED NOT DETECTED Final    Comment: Performed at [facility], [place] [address] [place], [place] [address]  Urine culture     Status: Abnormal   Collection Time: 03/27/18  8:18 PM  Result Value Ref Range Status   Specimen Description URINE, CATHETERIZED  Final   Special Requests   Final    NONE Performed at [facility], [place] [address], [place], [place] [address]    Culture >=100,000 COLONIES/mL PSEUDOMONAS AERUGINOSA (A)  Final   Report Status 03/30/2018 FINAL  Final   Organism ID, Bacteria PSEUDOMONAS AERUGINOSA (A)  Final      Susceptibility   Pseudomonas aeruginosa - MIC*    CEFTAZIDIME 4 SENSITIVE Sensitive     CIPROFLOXACIN <=0.25 SENSITIVE Sensitive     GENTAMICIN <=1 SENSITIVE Sensitive     IMIPENEM 2 SENSITIVE  Sensitive     PIP/TAZO <=4 SENSITIVE Sensitive     CEFEPIME 2 SENSITIVE Sensitive     * >=100,000 COLONIES/mL PSEUDOMONAS AERUGINOSA     Liver Function Tests: Recent Labs  Lab 03/27/18 1923 03/28/18 0434 03/29/18 0426 03/30/18 0941  AST 276* 156* 84* 59*  ALT 259* 196* 143* 99*  ALKPHOS 340* 259* 219* 197*  BILITOT 3.7* 4.7* 2.8* 1.1  PROT 7.2 6.3* 6.3* 5.8*  ALBUMIN 3.3* 2.7* 2.6* 2.4*   No results for input(s): LIPASE, AMYLASE in the last 168 hours. No results for input(s): AMMONIA in the last 168 hours.  Cardiac Enzymes: Recent Labs  Lab 03/28/18 0434 03/28/18 0732 03/28/18 1512  CKTOTAL 26*  --   --   CKMB 0.7  --   --   TROPONINI 0.05* 0.04* 0.03*   BNP (last 3 results) No results for input(s): BNP in the last 8760 hours.  ProBNP (last 3 results) No results for input(s): PROBNP in the last 8760 hours.    Studies: No results found.  Scheduled Meds: . aspirin EC  81 mg Oral Daily  . cholecalciferol  2,000 Units Oral Daily  . enoxaparin (LOVENOX) injection  40 mg Subcutaneous Q24H  . lisinopril  10 mg Oral Daily  . multivitamin with minerals  1 tablet Oral Daily  . pantoprazole  40 mg Oral Q lunch  . polyvinyl alcohol  1-2 drop Both Eyes Daily  . pregabalin  150 mg Oral Daily  . pregabalin  300 mg Oral QHS  . rosuvastatin  20 mg Oral QHS      Time spent: 25 min  [place]  [facility] Pager [phone]. If 7PM-7AM, please contact night-coverage at [email or web address], Office  [phone]  password [place]  03/30/2018, 1:03 PM  LOS: 3 days

## 2018-03-30 NOTE — Progress Notes (Signed)
Patient sleeping and had 11 beats v-tach. Then back to [facility]

## 2018-03-30 NOTE — Progress Notes (Signed)
Subjective: No new complaints   Antibiotics:  Anti-infectives (From admission, onward)   Start     Dose/Rate Route Frequency Ordered Stop   03/30/18 1400  piperacillin-tazobactam (ZOSYN) IVPB 3.375 g     3.375 g 12.5 mL/hr over 240 Minutes Intravenous Every 8 hours 03/30/18 1015     03/29/18 1600  ceFEPIme (MAXIPIME) 2 g in sodium chloride 0.9 % 100 mL IVPB  Status:  Discontinued     2 g 200 mL/hr over 30 Minutes Intravenous Every 8 hours 03/29/18 1500 03/30/18 1013   03/28/18 2200  vancomycin (VANCOCIN) IVPB 1000 mg/200 mL premix  Status:  Discontinued     1,000 mg 200 mL/hr over 60 Minutes Intravenous Every 12 hours 03/28/18 1725 03/30/18 1013   03/28/18 1430  cefTRIAXone (ROCEPHIN) 2 g in sodium chloride 0.9 % 100 mL IVPB  Status:  Discontinued     2 g 200 mL/hr over 30 Minutes Intravenous Every 24 hours 03/28/18 1331 03/29/18 1456   03/28/18 0900  vancomycin (VANCOCIN) IVPB 1000 mg/200 mL premix  Status:  Discontinued     1,000 mg 200 mL/hr over 60 Minutes Intravenous Every 12 hours 03/27/18 2100 03/28/18 1332   03/28/18 0600  ceFEPIme (MAXIPIME) 1 g in sodium chloride 0.9 % 100 mL IVPB  Status:  Discontinued     1 g 200 mL/hr over 30 Minutes Intravenous Every 8 hours 03/28/18 0317 03/28/18 0318   03/28/18 0400  ceFEPIme (MAXIPIME) 2 g in sodium chloride 0.9 % 100 mL IVPB  Status:  Discontinued     2 g 200 mL/hr over 30 Minutes Intravenous Every 8 hours 03/27/18 2100 03/28/18 1331   03/27/18 1930  ceFEPIme (MAXIPIME) 2 g in sodium chloride 0.9 % 100 mL IVPB     2 g 200 mL/hr over 30 Minutes Intravenous  Once 03/27/18 1925 03/27/18 2015   03/27/18 1930  metroNIDAZOLE (FLAGYL) IVPB 500 mg  Status:  Discontinued     500 mg 100 mL/hr over 60 Minutes Intravenous Every 8 hours 03/27/18 1925 03/28/18 0318   03/27/18 1930  vancomycin (VANCOCIN) IVPB 1000 mg/200 mL premix     1,000 mg 200 mL/hr over 60 Minutes Intravenous  Once 03/27/18 1925 03/27/18 2116       Medications: Scheduled Meds: . aspirin EC  81 mg Oral Daily  . cholecalciferol  2,000 Units Oral Daily  . enoxaparin (LOVENOX) injection  40 mg Subcutaneous Q24H  . lisinopril  10 mg Oral Daily  . multivitamin with minerals  1 tablet Oral Daily  . pantoprazole  40 mg Oral Q lunch  . polyvinyl alcohol  1-2 drop Both Eyes Daily  . pregabalin  150 mg Oral Daily  . pregabalin  300 mg Oral QHS  . rosuvastatin  20 mg Oral QHS   Continuous Infusions: . piperacillin-tazobactam (ZOSYN)  IV     PRN Meds:.albuterol, traMADol    Objective: Weight change: -1 kg  Intake/Output Summary (Last 24 hours) at 03/30/2018 1221 Last data filed at 03/30/2018 0900 Gross per 24 hour  Intake 2042.68 ml  Output 1025 ml  Net 1017.68 ml   Blood pressure (!) 144/79, pulse 70, temperature 98.7 F (37.1 C), resp. rate 20, height 5\' 11"  (1.803 m), weight 65.1 kg, SpO2 98 %. Temp:  [97.5 F (36.4 C)-99 F (37.2 C)] 98.7 F (37.1 C) (09/09 0800) Pulse Rate:  [70-76] 70 (09/08 2000) Resp:  [18-21] 20 (09/09 0400) BP: (114-144)/(68-79) 144/79 (09/09 0400) SpO2:  [  96 %-100 %] 98 % (09/09 0800) Weight:  [65.1 kg] 65.1 kg (09/09 0400)  Physical Exam: General: Alert and awake, oriented x3, not in any acute distress. HEENT: anicteric sclera, EOMI CVS regular rate, normal , no mgr Chest: , no wheezing, no respiratory distress Abdomen: soft non-distended,  Extremities: no edema or deformity noted bilaterally Skin: no rashes Neuro: nonfocal  CBC:    BMET Recent Labs    03/29/18 0426 03/30/18 0941  NA 138 137  K 3.8 3.7  CL 107 109  CO2 22 18*  GLUCOSE 139* 281*  BUN 12 10  CREATININE 0.73 0.66  CALCIUM 8.2* 8.2*     Liver Panel  Recent Labs    03/29/18 0426 03/30/18 0941  PROT 6.3* 5.8*  ALBUMIN 2.6* 2.4*  AST 84* 59*  ALT 143* 99*  ALKPHOS 219* 197*  BILITOT 2.8* 1.1       Sedimentation Rate No results for input(s): ESRSEDRATE in the last 72 hours. C-Reactive  Protein No results for input(s): CRP in the last 72 hours.  Micro Results: Recent Results (from the past 720 hour(s))  Blood Culture (routine x 2)     Status: None (Preliminary result)   Collection Time: 03/27/18  7:00 PM  Result Value Ref Range Status   Specimen Description BLOOD RIGHT WRIST  Final   Special Requests   Final    BOTTLES DRAWN AEROBIC AND ANAEROBIC Blood Culture results may not be optimal due to an inadequate volume of blood received in culture bottles   Culture  Setup Time   Final    GRAM NEGATIVE RODS AEROBIC BOTTLE ONLY CRITICAL RESULT CALLED TO, READ BACK BY AND VERIFIED WITH: A. MASTERS, PHARMD AT 1310 ON 03/28/18 BY C. JESSUP, MLT. CORRECTED RESULTS PREVIOUSLY REPORTED AS: GRAM POSITIVE COCCI IN PAIRS AND CHAINS ANAEROBIC BOTTLE ONLY CRITICAL RESULT CALLED TO, READ BACK BY AND VERIFIED WITH: A. MASTERS, PHARMD AT 1705 ON 03/28/18 BY C. JESSUP, MLT. Results Called to: A. MASTERS, PHARMD AT 0930 ON 03/29/18 BY C. JESSUP, MLT. Performed at [facility], [place] [address], [place], [place] [address]    Culture   Final    Lonell Grandchild NEGATIVE RODS CORRECTED RESULTS PREVIOUSLY REPORTED AS: GRAM POSITIVE COCCI    Report Status PENDING  Incomplete  Blood Culture ID Panel (Reflexed)     Status: Abnormal   Collection Time: 03/27/18  7:00 PM  Result Value Ref Range Status   Enterococcus species DETECTED (A) NOT DETECTED Final    Comment: CRITICAL RESULT CALLED TO, READ BACK BY AND VERIFIED WITH: A. MASTERS, PHARMD AT 1705 ON 03/28/18 BY C. JESSUP, MLT.    Vancomycin resistance NOT DETECTED NOT DETECTED Final   Listeria monocytogenes NOT DETECTED NOT DETECTED Final   Staphylococcus species NOT DETECTED NOT DETECTED Final   Staphylococcus aureus NOT DETECTED NOT DETECTED Final   Streptococcus species NOT DETECTED NOT DETECTED Final   Streptococcus agalactiae NOT DETECTED NOT DETECTED Final   Streptococcus pneumoniae NOT DETECTED NOT DETECTED Final   Streptococcus pyogenes NOT  DETECTED NOT DETECTED Final   Acinetobacter baumannii NOT DETECTED NOT DETECTED Final   Enterobacteriaceae species NOT DETECTED NOT DETECTED Final   Enterobacter cloacae complex NOT DETECTED NOT DETECTED Final   Escherichia coli NOT DETECTED NOT DETECTED Final   Klebsiella oxytoca NOT DETECTED NOT DETECTED Final   Klebsiella pneumoniae NOT DETECTED NOT DETECTED Final   Proteus species NOT DETECTED NOT DETECTED Final   Serratia marcescens NOT DETECTED NOT DETECTED Final  Haemophilus influenzae NOT DETECTED NOT DETECTED Final   Neisseria meningitidis NOT DETECTED NOT DETECTED Final   Pseudomonas aeruginosa NOT DETECTED NOT DETECTED Final   Candida albicans NOT DETECTED NOT DETECTED Final   Candida glabrata NOT DETECTED NOT DETECTED Final   Candida krusei NOT DETECTED NOT DETECTED Final   Candida parapsilosis NOT DETECTED NOT DETECTED Final   Candida tropicalis NOT DETECTED NOT DETECTED Final    Comment: Performed at [facility], [place] [address] [place], [place] [address]  Blood Culture (routine x 2)     Status: None (Preliminary result)   Collection Time: 03/27/18  7:35 PM  Result Value Ref Range Status   Specimen Description SITE NOT SPECIFIED  Final   Special Requests   Final    BOTTLES DRAWN AEROBIC AND ANAEROBIC Blood Culture adequate volume   Culture  Setup Time   Final    GRAM NEGATIVE RODS IN BOTH AEROBIC AND ANAEROBIC BOTTLES CRITICAL RESULT CALLED TO, READ BACK BY AND VERIFIED WITH: A. MASTERS, PHARMD AT 1310 ON 03/28/18 BY C. JESSUP, MLT. Performed at [facility], [place] [address], [place], [place] [address]    Culture GRAM NEGATIVE RODS  Final   Report Status PENDING  Incomplete  Blood Culture ID Panel (Reflexed)     Status: Abnormal   Collection Time: 03/27/18  7:35 PM  Result Value Ref Range Status   Enterococcus species NOT DETECTED NOT DETECTED Final   Listeria monocytogenes NOT DETECTED NOT DETECTED Final   Staphylococcus species NOT DETECTED NOT DETECTED  Final   Staphylococcus aureus NOT DETECTED NOT DETECTED Final   Streptococcus species NOT DETECTED NOT DETECTED Final   Streptococcus agalactiae NOT DETECTED NOT DETECTED Final   Streptococcus pneumoniae NOT DETECTED NOT DETECTED Final   Streptococcus pyogenes NOT DETECTED NOT DETECTED Final   Acinetobacter baumannii NOT DETECTED NOT DETECTED Final   Enterobacteriaceae species DETECTED (A) NOT DETECTED Final    Comment: Enterobacteriaceae represent a large family of gram-negative bacteria, not a single organism. CRITICAL RESULT CALLED TO, READ BACK BY AND VERIFIED WITH: A. MASTERS, PHARMD AT 1310 03/28/18 BY C. JESSUP, MLT.    Enterobacter cloacae complex NOT DETECTED NOT DETECTED Final   Escherichia coli NOT DETECTED NOT DETECTED Final   Klebsiella oxytoca DETECTED (A) NOT DETECTED Final    Comment: CRITICAL RESULT CALLED TO, READ BACK BY AND VERIFIED WITH: A. MASTERS, PHARMD AT 1310 03/28/18 BY C. JESSUP, MLT.    Klebsiella pneumoniae NOT DETECTED NOT DETECTED Final   Proteus species NOT DETECTED NOT DETECTED Final   Serratia marcescens NOT DETECTED NOT DETECTED Final   Carbapenem resistance NOT DETECTED NOT DETECTED Final   Haemophilus influenzae NOT DETECTED NOT DETECTED Final   Neisseria meningitidis NOT DETECTED NOT DETECTED Final   Pseudomonas aeruginosa NOT DETECTED NOT DETECTED Final   Candida albicans NOT DETECTED NOT DETECTED Final   Candida glabrata NOT DETECTED NOT DETECTED Final   Candida krusei NOT DETECTED NOT DETECTED Final   Candida parapsilosis NOT DETECTED NOT DETECTED Final   Candida tropicalis NOT DETECTED NOT DETECTED Final    Comment: Performed at [facility], [place] [address], [place], [place] [address]  Urine culture     Status: Abnormal   Collection Time: 03/27/18  8:18 PM  Result Value Ref Range Status   Specimen Description URINE, CATHETERIZED  Final   Special Requests   Final    NONE Performed at [facility], [place],  [place] [address]    Culture >=100,000 COLONIES/mL PSEUDOMONAS AERUGINOSA (A)  Final   Report Status 03/30/2018 FINAL  Final   Organism ID, Bacteria PSEUDOMONAS AERUGINOSA (A)  Final      Susceptibility   Pseudomonas aeruginosa - MIC*    CEFTAZIDIME 4 SENSITIVE Sensitive     CIPROFLOXACIN <=0.25 SENSITIVE Sensitive     GENTAMICIN <=1 SENSITIVE Sensitive     IMIPENEM 2 SENSITIVE Sensitive     PIP/TAZO <=4 SENSITIVE Sensitive     CEFEPIME 2 SENSITIVE Sensitive     * >=100,000 COLONIES/mL PSEUDOMONAS AERUGINOSA    Studies/Results: No results found.    Assessment/Plan:  INTERVAL HISTORY: Blood cutures still not finalized   Principal Problem:   Pneumonia Active Problems:   Sepsis ([place])   Acute lower UTI   Elevated troponin    Jaime [name] is a 77 y.o. male with patient for sepsis with blood cultures growing organisms and B CID identifying enterococcus but cultures growing gram-negative rods.  He also has a pseudomonas aeruginosa growing from his urine culture.  1.  Sepsis with gram-negative bacteremia plus or minus an enterococcus that does not seem to be growing but identified on B CID:  We will switch him from vancomycin and cefepime to Zosyn to cover the gram-negative rod in the blood along with enterococcus assuming it is an Enterococcus faecalis if we can even isolated  Repeat blood cultures  Follow-up cultures from blood and urine and see if they match if there is a mismatch of the organisms we need to look for another site besides the urine is being the source.  He does not have a whole lot in the way of symptoms I did say he had some dysuria with a condom catheter.     LOS: 3 days   Alcide Evener 03/30/2018, 12:21 PM

## 2018-03-31 ENCOUNTER — Inpatient Hospital Stay (HOSPITAL_COMMUNITY): Payer: Medicare Other

## 2018-03-31 ENCOUNTER — Encounter (HOSPITAL_COMMUNITY): Payer: Self-pay | Admitting: General Practice

## 2018-03-31 ENCOUNTER — Other Ambulatory Visit: Payer: Self-pay

## 2018-03-31 DIAGNOSIS — A419 Sepsis, unspecified organism: Secondary | ICD-10-CM

## 2018-03-31 DIAGNOSIS — R945 Abnormal results of liver function studies: Secondary | ICD-10-CM

## 2018-03-31 LAB — COMPREHENSIVE METABOLIC PANEL
ALBUMIN: 2.5 g/dL — AB (ref 3.5–5.0)
ALK PHOS: 187 U/L — AB (ref 38–126)
ALT: 87 U/L — ABNORMAL HIGH (ref 0–44)
AST: 48 U/L — AB (ref 15–41)
Anion gap: 7 (ref 5–15)
BUN: 10 mg/dL (ref 8–23)
CO2: 23 mmol/L (ref 22–32)
Calcium: 8.6 mg/dL — ABNORMAL LOW (ref 8.9–10.3)
Chloride: 109 mmol/L (ref 98–111)
Creatinine, Ser: 0.61 mg/dL (ref 0.61–1.24)
GFR calc Af Amer: >60 mL/min (ref >60)
GFR calc non Af Amer: >60 mL/min (ref >60)
GLUCOSE: 117 mg/dL — AB (ref 70–99)
POTASSIUM: 3.5 mmol/L (ref 3.5–5.1)
Sodium: 139 mmol/L (ref 135–145)
Total Bilirubin: 1 mg/dL (ref 0.3–1.2)
Total Protein: 6.1 g/dL — ABNORMAL LOW (ref 6.5–8.1)

## 2018-03-31 LAB — CULTURE, BLOOD (ROUTINE X 2)

## 2018-03-31 LAB — CBC
HEMATOCRIT: 33.3 % — AB (ref 39.0–52.0)
HEMOGLOBIN: 11.1 g/dL — AB (ref 13.0–17.0)
MCH: 26.4 pg (ref 26.0–34.0)
MCHC: 33.3 g/dL (ref 30.0–36.0)
MCV: 79.1 fL (ref 78.0–100.0)
Platelets: 188 10*3/uL (ref 150–400)
RBC: 4.21 MIL/uL — AB (ref 4.22–5.81)
RDW: 17 % — ABNORMAL HIGH (ref 11.5–15.5)
WBC: 4.7 10*3/uL (ref 4.0–10.5)

## 2018-03-31 MED ORDER — IOPAMIDOL (ISOVUE-300) INJECTION 61%
100.0000 mL | Freq: Once | INTRAVENOUS | Status: AC | PRN
  Administered 2018-03-31: 100 mL via INTRAVENOUS

## 2018-03-31 MED ORDER — IOPAMIDOL (ISOVUE-300) INJECTION 61%
INTRAVENOUS | Status: AC
  Administered 2018-03-31: 13:00:00
  Filled 2018-03-31: qty 30

## 2018-03-31 MED ORDER — IOPAMIDOL (ISOVUE-300) INJECTION 61%
INTRAVENOUS | Status: AC
  Filled 2018-03-31: qty 100

## 2018-03-31 MED ORDER — ZOLPIDEM TARTRATE 5 MG PO TABS
5.0000 mg | ORAL_TABLET | Freq: Every evening | ORAL | Status: DC | PRN
  Administered 2018-04-01: 5 mg via ORAL
  Filled 2018-03-31: qty 1

## 2018-03-31 NOTE — Progress Notes (Addendum)
[facility]  PROGRESS NOTE  NTHONY LEFFERTS XYI:[id number] DOB: August 12, 1940 DOA: 03/27/2018 PCP: [name], MD   Brief HPI:   77 year old male with history of multiple sclerosis, hypertension, hyperlipidemia, type 2 diabetes mellitus, CAD status post DES, PVD, pulmonary hypertension, CHF EF 30 to 35% came with complaints of dysuria and fever.  Patient was started on empiric antibiotics for sepsis due to UTI.  Also had elevated troponin, cardiology was consulted.  Cardiology plan no further intervention, likely from demand ischemia.  Patient blood culture growing Klebsiella, urine culture growing Pseudomonas.  BC ID positive for enterococcus.  ID has been consulted.  No TEE has been recommended.  IV antibiotics changed from vancomycin and cefepime to IV Zosyn per ID.  Follow culture results.   Subjective   Patient seen and examined, denies any complaints.  Wants something for sleep at night.   Assessment/Plan:     1. Klebsiella, bacteremia--blood cultures growing Klebsiella, enterococcus positive BCID.  Urine culture is growing Pseudomonas.  Antibiotics have been changed from vancomycin, cefepime to IV Zosyn.  Follow blood and urine culture results.  ID following.  No TEE recommended.  2. Sepsis due to UTI--urine culture growing Pseudomonas, antibiotics changed to Zosyn.  WBC down to 4.7  3. Elevated troponin-cardiology was consulted, no further intervention as per cardiology.  Troponin 0.04, likely from demand ischemia from sepsis.  4. Diabetes mellitus-sliding scale insulin NovoLog.  Blood glucose well controlled.  5. Transaminitis-LFTs are improving, likely from shock liver.  Abdominal ultrasound showed no acute abnormality.  Hepatitis panel is negative  6. Hypertension-blood pressure stable, continue lisinopril 7. Insomnia-- we will add Ambien at night.     CBG: No results for input(s): GLUCAP in the last 168 hours.  CBC: Recent Labs  Lab 03/27/18 1923  03/28/18 0434 03/29/18 0426 03/30/18 0941 03/31/18 0832  WBC 12.3* 13.0* 7.0 4.6 4.7  NEUTROABS 11.0*  --   --   --   --   HGB 13.9 11.9* 11.7* 11.2* 11.1*  HCT 43.1 35.7* 35.3* 34.6* 33.3*  MCV 80.9 78.8 78.8 81.6 79.1  PLT 190 162 161 143* 270    Basic Metabolic Panel: Recent Labs  Lab 03/27/18 1923 03/28/18 0434 03/29/18 0426 03/30/18 0941 03/31/18 0832  NA 137 137 138 137 139  K 3.7 3.7 3.8 3.7 3.5  CL 106 107 107 109 109  CO2 18* 21* 22 18* 23  GLUCOSE 232* 137* 139* 281* 117*  BUN 8 10 12 10 10   CREATININE 0.71 0.71 0.73 0.66 0.61  CALCIUM 8.8* 8.3* 8.2* 8.2* 8.6*     DVT prophylaxis: SCDs  Code Status: Full code  Family Communication: No family at bedside  Disposition Plan: To be decided   Consultants:  Cardiology  Procedures:  None   Antibiotics:   Anti-infectives (From admission, onward)   Start     Dose/Rate Route Frequency Ordered Stop   03/30/18 1400  piperacillin-tazobactam (ZOSYN) IVPB 3.375 g     3.375 g 12.5 mL/hr over 240 Minutes Intravenous Every 8 hours 03/30/18 1015     03/29/18 1600  ceFEPIme (MAXIPIME) 2 g in sodium chloride 0.9 % 100 mL IVPB  Status:  Discontinued     2 g 200 mL/hr over 30 Minutes Intravenous Every 8 hours 03/29/18 1500 03/30/18 1013   03/28/18 2200  vancomycin (VANCOCIN) IVPB 1000 mg/200 mL premix  Status:  Discontinued     1,000 mg 200 mL/hr over 60 Minutes Intravenous Every 12 hours 03/28/18 1725 03/30/18  1013   03/28/18 1430  cefTRIAXone (ROCEPHIN) 2 g in sodium chloride 0.9 % 100 mL IVPB  Status:  Discontinued     2 g 200 mL/hr over 30 Minutes Intravenous Every 24 hours 03/28/18 1331 03/29/18 1456   03/28/18 0900  vancomycin (VANCOCIN) IVPB 1000 mg/200 mL premix  Status:  Discontinued     1,000 mg 200 mL/hr over 60 Minutes Intravenous Every 12 hours 03/27/18 2100 03/28/18 1332   03/28/18 0600  ceFEPIme (MAXIPIME) 1 g in sodium chloride 0.9 % 100 mL IVPB  Status:  Discontinued     1 g 200 mL/hr over 30  Minutes Intravenous Every 8 hours 03/28/18 0317 03/28/18 0318   03/28/18 0400  ceFEPIme (MAXIPIME) 2 g in sodium chloride 0.9 % 100 mL IVPB  Status:  Discontinued     2 g 200 mL/hr over 30 Minutes Intravenous Every 8 hours 03/27/18 2100 03/28/18 1331   03/27/18 1930  ceFEPIme (MAXIPIME) 2 g in sodium chloride 0.9 % 100 mL IVPB     2 g 200 mL/hr over 30 Minutes Intravenous  Once 03/27/18 1925 03/27/18 2015   03/27/18 1930  metroNIDAZOLE (FLAGYL) IVPB 500 mg  Status:  Discontinued     500 mg 100 mL/hr over 60 Minutes Intravenous Every 8 hours 03/27/18 1925 03/28/18 0318   03/27/18 1930  vancomycin (VANCOCIN) IVPB 1000 mg/200 mL premix     1,000 mg 200 mL/hr over 60 Minutes Intravenous  Once 03/27/18 1925 03/27/18 2116       Objective   Vitals:   03/30/18 0800 03/30/18 1940 03/31/18 0000 03/31/18 0358  BP:  124/75 (!) 150/72 (!) 127/59  Pulse:   73   Resp:   18   Temp: 98.7 F (37.1 C) 98.4 F (36.9 C) 99.5 F (37.5 C) 98.9 F (37.2 C)  TempSrc:  Oral Oral Oral  SpO2: 98%  98%   Weight:    67.1 kg  Height:        Intake/Output Summary (Last 24 hours) at 03/31/2018 1004 Last data filed at 03/31/2018 0527 Gross per 24 hour  Intake 315.67 ml  Output 1375 ml  Net -1059.33 ml   Filed Weights   03/29/18 0520 03/30/18 0400 03/31/18 0358  Weight: 66.1 kg 65.1 kg 67.1 kg     Physical Examination:  Eyes: No icterus, extraocular muscles intact  Mouth: Oral mucosa is moist, no lesions on palate,  Neck: Supple, no deformities, masses, or tenderness Lungs: Normal respiratory effort, bilateral clear to auscultation, no crackles or wheezes.  Heart: Regular rate and rhythm, S1 and S2 normal, no murmurs, rubs auscultated Abdomen: BS normoactive,soft,nondistended,non-tender to palpation,no organomegaly Extremities: No pretibial edema, no erythema, no cyanosis, no clubbing Neuro : Alert and oriented to time, place and person, No focal deficits     Data Reviewed: I have personally  reviewed following labs and imaging studies   Recent Results (from the past 240 hour(s))  Blood Culture (routine x 2)     Status: None (Preliminary result)   Collection Time: 03/27/18  7:00 PM  Result Value Ref Range Status   Specimen Description BLOOD RIGHT WRIST  Final   Special Requests   Final    BOTTLES DRAWN AEROBIC AND ANAEROBIC Blood Culture results may not be optimal due to an inadequate volume of blood received in culture bottles   Culture  Setup Time   Final    GRAM NEGATIVE RODS AEROBIC BOTTLE ONLY CRITICAL RESULT CALLED TO, READ BACK BY AND VERIFIED WITH:  A. MASTERS, PHARMD AT 1310 ON 03/28/18 BY C. JESSUP, MLT. CORRECTED RESULTS PREVIOUSLY REPORTED AS: GRAM POSITIVE COCCI IN PAIRS AND CHAINS ANAEROBIC BOTTLE ONLY CRITICAL RESULT CALLED TO, READ BACK BY AND VERIFIED WITH: A. MASTERS, PHARMD AT 1705 ON 03/28/18 BY C. JESSUP, MLT. Results Called to: A. MASTERS, PHARMD AT 0930 ON 03/29/18 BY C. JESSUP, MLT. Performed at [facility], [place] [address], [place], [place] [address]    Culture   Final    Lonell Grandchild NEGATIVE RODS CORRECTED RESULTS PREVIOUSLY REPORTED AS: GRAM POSITIVE COCCI    Report Status PENDING  Incomplete  Blood Culture ID Panel (Reflexed)     Status: Abnormal   Collection Time: 03/27/18  7:00 PM  Result Value Ref Range Status   Enterococcus species DETECTED (A) NOT DETECTED Final    Comment: CRITICAL RESULT CALLED TO, READ BACK BY AND VERIFIED WITH: A. MASTERS, PHARMD AT 1705 ON 03/28/18 BY C. JESSUP, MLT.    Vancomycin resistance NOT DETECTED NOT DETECTED Final   Listeria monocytogenes NOT DETECTED NOT DETECTED Final   Staphylococcus species NOT DETECTED NOT DETECTED Final   Staphylococcus aureus NOT DETECTED NOT DETECTED Final   Streptococcus species NOT DETECTED NOT DETECTED Final   Streptococcus agalactiae NOT DETECTED NOT DETECTED Final   Streptococcus pneumoniae NOT DETECTED NOT DETECTED Final   Streptococcus pyogenes NOT DETECTED NOT DETECTED Final    Acinetobacter baumannii NOT DETECTED NOT DETECTED Final   Enterobacteriaceae species NOT DETECTED NOT DETECTED Final   Enterobacter cloacae complex NOT DETECTED NOT DETECTED Final   Escherichia coli NOT DETECTED NOT DETECTED Final   Klebsiella oxytoca NOT DETECTED NOT DETECTED Final   Klebsiella pneumoniae NOT DETECTED NOT DETECTED Final   Proteus species NOT DETECTED NOT DETECTED Final   Serratia marcescens NOT DETECTED NOT DETECTED Final   Haemophilus influenzae NOT DETECTED NOT DETECTED Final   Neisseria meningitidis NOT DETECTED NOT DETECTED Final   Pseudomonas aeruginosa NOT DETECTED NOT DETECTED Final   Candida albicans NOT DETECTED NOT DETECTED Final   Candida glabrata NOT DETECTED NOT DETECTED Final   Candida krusei NOT DETECTED NOT DETECTED Final   Candida parapsilosis NOT DETECTED NOT DETECTED Final   Candida tropicalis NOT DETECTED NOT DETECTED Final    Comment: Performed at [facility], [place] [address] [place], [place] [address]  Blood Culture (routine x 2)     Status: None (Preliminary result)   Collection Time: 03/27/18  7:35 PM  Result Value Ref Range Status   Specimen Description SITE NOT SPECIFIED  Final   Special Requests   Final    BOTTLES DRAWN AEROBIC AND ANAEROBIC Blood Culture adequate volume   Culture  Setup Time   Final    GRAM NEGATIVE RODS IN BOTH AEROBIC AND ANAEROBIC BOTTLES CRITICAL RESULT CALLED TO, READ BACK BY AND VERIFIED WITH: A. MASTERS, PHARMD AT 1310 ON 03/28/18 BY C. JESSUP, MLT.    Culture   Final    GRAM NEGATIVE RODS CULTURE REINCUBATED FOR BETTER GROWTH Performed at [facility], [place] [address] [place], [place] [address]    Report Status PENDING  Incomplete  Blood Culture ID Panel (Reflexed)     Status: Abnormal   Collection Time: 03/27/18  7:35 PM  Result Value Ref Range Status   Enterococcus species NOT DETECTED NOT DETECTED Final   Listeria monocytogenes NOT DETECTED NOT DETECTED Final   Staphylococcus species NOT  DETECTED NOT DETECTED Final   Staphylococcus aureus NOT DETECTED NOT DETECTED Final  Streptococcus species NOT DETECTED NOT DETECTED Final   Streptococcus agalactiae NOT DETECTED NOT DETECTED Final   Streptococcus pneumoniae NOT DETECTED NOT DETECTED Final   Streptococcus pyogenes NOT DETECTED NOT DETECTED Final   Acinetobacter baumannii NOT DETECTED NOT DETECTED Final   Enterobacteriaceae species DETECTED (A) NOT DETECTED Final    Comment: Enterobacteriaceae represent a large family of gram-negative bacteria, not a single organism. CRITICAL RESULT CALLED TO, READ BACK BY AND VERIFIED WITH: A. MASTERS, PHARMD AT 1310 03/28/18 BY C. JESSUP, MLT.    Enterobacter cloacae complex NOT DETECTED NOT DETECTED Final   Escherichia coli NOT DETECTED NOT DETECTED Final   Klebsiella oxytoca DETECTED (A) NOT DETECTED Final    Comment: CRITICAL RESULT CALLED TO, READ BACK BY AND VERIFIED WITH: A. MASTERS, PHARMD AT 1310 03/28/18 BY C. JESSUP, MLT.    Klebsiella pneumoniae NOT DETECTED NOT DETECTED Final   Proteus species NOT DETECTED NOT DETECTED Final   Serratia marcescens NOT DETECTED NOT DETECTED Final   Carbapenem resistance NOT DETECTED NOT DETECTED Final   Haemophilus influenzae NOT DETECTED NOT DETECTED Final   Neisseria meningitidis NOT DETECTED NOT DETECTED Final   Pseudomonas aeruginosa NOT DETECTED NOT DETECTED Final   Candida albicans NOT DETECTED NOT DETECTED Final   Candida glabrata NOT DETECTED NOT DETECTED Final   Candida krusei NOT DETECTED NOT DETECTED Final   Candida parapsilosis NOT DETECTED NOT DETECTED Final   Candida tropicalis NOT DETECTED NOT DETECTED Final    Comment: Performed at [facility], [place] [address] [place], [place] [address]  Urine culture     Status: Abnormal   Collection Time: 03/27/18  8:18 PM  Result Value Ref Range Status   Specimen Description URINE, CATHETERIZED  Final   Special Requests   Final    NONE Performed at [facility], [place] [address] [place], [place] [address]    Culture >=100,000 COLONIES/mL PSEUDOMONAS AERUGINOSA (A)  Final   Report Status 03/30/2018 FINAL  Final   Organism ID, Bacteria PSEUDOMONAS AERUGINOSA (A)  Final      Susceptibility   Pseudomonas aeruginosa - MIC*    CEFTAZIDIME 4 SENSITIVE Sensitive     CIPROFLOXACIN <=0.25 SENSITIVE Sensitive     GENTAMICIN <=1 SENSITIVE Sensitive     IMIPENEM 2 SENSITIVE Sensitive     PIP/TAZO <=4 SENSITIVE Sensitive     CEFEPIME 2 SENSITIVE Sensitive     * >=100,000 COLONIES/mL PSEUDOMONAS AERUGINOSA  Culture, blood (Routine X 2) w Reflex to ID Panel     Status: None (Preliminary result)   Collection Time: 03/30/18 12:08 PM  Result Value Ref Range Status   Specimen Description BLOOD LEFT HAND  Final   Special Requests   Final    BOTTLES DRAWN AEROBIC ONLY Blood Culture adequate volume   Culture   Final    NO GROWTH < 24 HOURS Performed at [facility], [place] [address] [place], [place] [address]    Report Status PENDING  Incomplete  Culture, blood (Routine X 2) w Reflex to ID Panel     Status: None (Preliminary result)   Collection Time: 03/30/18 12:11 PM  Result Value Ref Range Status   Specimen Description BLOOD SITE NOT SPECIFIED  Final   Special Requests   Final    BOTTLES DRAWN AEROBIC ONLY Blood Culture adequate volume   Culture   Final    NO GROWTH < 24 HOURS Performed at [facility], [place] [address], [place], [place]  [address]    Report Status PENDING  Incomplete     Liver Function Tests: Recent Labs  Lab 03/27/18 1923 03/28/18 0434 03/29/18 0426 03/30/18 0941 03/31/18 0832  AST 276* 156* 84* 59* 48*  ALT 259* 196* 143* 99* 87*  ALKPHOS 340* 259* 219* 197* 187*  BILITOT 3.7* 4.7* 2.8* 1.1 1.0  PROT 7.2 6.3* 6.3* 5.8* 6.1*  ALBUMIN 3.3* 2.7* 2.6* 2.4* 2.5*   No results for input(s): LIPASE, AMYLASE in the last 168 hours. No results for input(s): AMMONIA in the last 168 hours.  Cardiac Enzymes: Recent Labs  Lab  03/28/18 0434 03/28/18 0732 03/28/18 1512  CKTOTAL 26*  --   --   CKMB 0.7  --   --   TROPONINI 0.05* 0.04* 0.03*   BNP (last 3 results) No results for input(s): BNP in the last 8760 hours.  ProBNP (last 3 results) No results for input(s): PROBNP in the last 8760 hours.    Studies: No results found.  Scheduled Meds: . aspirin EC  81 mg Oral Daily  . cholecalciferol  2,000 Units Oral Daily  . enoxaparin (LOVENOX) injection  40 mg Subcutaneous Q24H  . lisinopril  10 mg Oral Daily  . multivitamin with minerals  1 tablet Oral Daily  . pantoprazole  40 mg Oral Q lunch  . polyvinyl alcohol  1-2 drop Both Eyes Daily  . pregabalin  150 mg Oral Daily  . pregabalin  300 mg Oral QHS  . rosuvastatin  20 mg Oral QHS      Time spent: 25 min  [facility] Pager [phone]. If 7PM-7AM, please contact night-coverage at [email or web address], Office  [phone]  password [email or web address]  03/31/2018, 10:04 AM  LOS: 4 days

## 2018-03-31 NOTE — Progress Notes (Signed)
[facility] Coordinator will follow pt with ID team to support long term home IV ABX if needed at DC to home.   If patient discharges after hours, please call [phone].   Jaime [name] 03/31/2018, 8:23 AM

## 2018-03-31 NOTE — Progress Notes (Signed)
Subjective: He DOES have RLQ pain  Antibiotics:  Anti-infectives (From admission, onward)   Start     Dose/Rate Route Frequency Ordered Stop   03/30/18 1400  piperacillin-tazobactam (ZOSYN) IVPB 3.375 g     3.375 g 12.5 mL/hr over 240 Minutes Intravenous Every 8 hours 03/30/18 1015     03/29/18 1600  ceFEPIme (MAXIPIME) 2 g in sodium chloride 0.9 % 100 mL IVPB  Status:  Discontinued     2 g 200 mL/hr over 30 Minutes Intravenous Every 8 hours 03/29/18 1500 03/30/18 1013   03/28/18 2200  vancomycin (VANCOCIN) IVPB 1000 mg/200 mL premix  Status:  Discontinued     1,000 mg 200 mL/hr over 60 Minutes Intravenous Every 12 hours 03/28/18 1725 03/30/18 1013   03/28/18 1430  cefTRIAXone (ROCEPHIN) 2 g in sodium chloride 0.9 % 100 mL IVPB  Status:  Discontinued     2 g 200 mL/hr over 30 Minutes Intravenous Every 24 hours 03/28/18 1331 03/29/18 1456   03/28/18 0900  vancomycin (VANCOCIN) IVPB 1000 mg/200 mL premix  Status:  Discontinued     1,000 mg 200 mL/hr over 60 Minutes Intravenous Every 12 hours 03/27/18 2100 03/28/18 1332   03/28/18 0600  ceFEPIme (MAXIPIME) 1 g in sodium chloride 0.9 % 100 mL IVPB  Status:  Discontinued     1 g 200 mL/hr over 30 Minutes Intravenous Every 8 hours 03/28/18 0317 03/28/18 0318   03/28/18 0400  ceFEPIme (MAXIPIME) 2 g in sodium chloride 0.9 % 100 mL IVPB  Status:  Discontinued     2 g 200 mL/hr over 30 Minutes Intravenous Every 8 hours 03/27/18 2100 03/28/18 1331   03/27/18 1930  ceFEPIme (MAXIPIME) 2 g in sodium chloride 0.9 % 100 mL IVPB     2 g 200 mL/hr over 30 Minutes Intravenous  Once 03/27/18 1925 03/27/18 2015   03/27/18 1930  metroNIDAZOLE (FLAGYL) IVPB 500 mg  Status:  Discontinued     500 mg 100 mL/hr over 60 Minutes Intravenous Every 8 hours 03/27/18 1925 03/28/18 0318   03/27/18 1930  vancomycin (VANCOCIN) IVPB 1000 mg/200 mL premix     1,000 mg 200 mL/hr over 60 Minutes Intravenous  Once 03/27/18 1925 03/27/18 2116       Medications: Scheduled Meds: . aspirin EC  81 mg Oral Daily  . cholecalciferol  2,000 Units Oral Daily  . enoxaparin (LOVENOX) injection  40 mg Subcutaneous Q24H  . iopamidol      . lisinopril  10 mg Oral Daily  . multivitamin with minerals  1 tablet Oral Daily  . pantoprazole  40 mg Oral Q lunch  . polyvinyl alcohol  1-2 drop Both Eyes Daily  . pregabalin  150 mg Oral Daily  . pregabalin  300 mg Oral QHS  . rosuvastatin  20 mg Oral QHS   Continuous Infusions: . piperacillin-tazobactam (ZOSYN)  IV 3.375 g (03/31/18 0527)   PRN Meds:.albuterol, traMADol, zolpidem    Objective: Weight change: 2 kg  Intake/Output Summary (Last 24 hours) at 03/31/2018 1530 Last data filed at 03/31/2018 1300 Gross per 24 hour  Intake 885.67 ml  Output 1375 ml  Net -489.33 ml   Blood pressure (!) 127/59, pulse 73, temperature 98.9 F (37.2 C), temperature source Oral, resp. rate 18, height 5\' 11"  (1.803 m), weight 67.1 kg, SpO2 98 %. Temp:  [98.4 F (36.9 C)-99.5 F (37.5 C)] 98.9 F (37.2 C) (09/10 0358) Pulse Rate:  [73] 73 (09/10  0000) Resp:  [18] 18 (09/10 0000) BP: (124-150)/(59-75) 127/59 (09/10 0358) SpO2:  [98 %] 98 % (09/10 0000) Weight:  [67.1 kg] 67.1 kg (09/10 0358)  Physical Exam: General: Alert and awake, oriented x3, not in any acute distress. HEENT: anicteric sclera, EOMI CVS regular rate, normal , no mgr Chest: , no wheezing, no respiratory distress Abdomen: soft to palpation of the right upper and lower quadrants positive bowel sounds no rebound tenderness. Extremities: no edema or deformity noted bilaterally Skin: no rashes Neuro: nonfocal  CBC:    BMET Recent Labs    03/30/18 0941 03/31/18 0832  NA 137 139  K 3.7 3.5  CL 109 109  CO2 18* 23  GLUCOSE 281* 117*  BUN 10 10  CREATININE 0.66 0.61  CALCIUM 8.2* 8.6*     Liver Panel  Recent Labs    03/30/18 0941 03/31/18 0832  PROT 5.8* 6.1*  ALBUMIN 2.4* 2.5*  AST 59* 48*  ALT 99* 87*   ALKPHOS 197* 187*  BILITOT 1.1 1.0       Sedimentation Rate No results for input(s): ESRSEDRATE in the last 72 hours. C-Reactive Protein No results for input(s): CRP in the last 72 hours.  Micro Results: Recent Results (from the past 720 hour(s))  Blood Culture (routine x 2)     Status: None   Collection Time: 03/27/18  7:00 PM  Result Value Ref Range Status   Specimen Description BLOOD RIGHT WRIST  Final   Special Requests   Final    BOTTLES DRAWN AEROBIC AND ANAEROBIC Blood Culture results may not be optimal due to an inadequate volume of blood received in culture bottles   Culture  Setup Time   Final    GRAM NEGATIVE RODS AEROBIC BOTTLE ONLY CRITICAL RESULT CALLED TO, READ BACK BY AND VERIFIED WITH: A. MASTERS, PHARMD AT 1310 ON 03/28/18 BY C. JESSUP, MLT. CORRECTED RESULTS PREVIOUSLY REPORTED AS: GRAM POSITIVE COCCI IN PAIRS AND CHAINS ANAEROBIC BOTTLE ONLY CRITICAL RESULT CALLED TO, READ BACK BY AND VERIFIED WITH: A. MASTERS, PHARMD AT 1705 ON 03/28/18 BY C. JESSUP, MLT. Results Called to: A. MASTERS, PHARMD AT 0930 ON 03/29/18 BY C. JESSUP, MLT.    Culture   Final    GRAM NEGATIVE RODS CORRECTED RESULTS PREVIOUSLY REPORTED AS: GRAM POSITIVE COCCI DISREGARD RESULT. SPECIMEN IDENTITY CANNOT BE VERIFIED. TEST HAS BEEN CREDITED. 09 10 2019 BEAMJ Performed at [facility], [place] [address] [place], [place] [address]    Report Status 03/31/2018 FINAL  Final  Blood Culture ID Panel (Reflexed)     Status: Abnormal   Collection Time: 03/27/18  7:00 PM  Result Value Ref Range Status   Enterococcus species DETECTED (A) NOT DETECTED Final    Comment: CRITICAL RESULT CALLED TO, READ BACK BY AND VERIFIED WITH: A. MASTERS, PHARMD AT 1705 ON 03/28/18 BY C. JESSUP, MLT.    Vancomycin resistance NOT DETECTED NOT DETECTED Final   Listeria monocytogenes NOT DETECTED NOT DETECTED Final   Staphylococcus species NOT DETECTED NOT DETECTED Final   Staphylococcus aureus NOT DETECTED NOT  DETECTED Final   Streptococcus species NOT DETECTED NOT DETECTED Final   Streptococcus agalactiae NOT DETECTED NOT DETECTED Final   Streptococcus pneumoniae NOT DETECTED NOT DETECTED Final   Streptococcus pyogenes NOT DETECTED NOT DETECTED Final   Acinetobacter baumannii NOT DETECTED NOT DETECTED Final   Enterobacteriaceae species NOT DETECTED NOT DETECTED Final   Enterobacter cloacae complex NOT DETECTED NOT DETECTED Final   Escherichia coli NOT DETECTED  NOT DETECTED Final   Klebsiella oxytoca NOT DETECTED NOT DETECTED Final   Klebsiella pneumoniae NOT DETECTED NOT DETECTED Final   Proteus species NOT DETECTED NOT DETECTED Final   Serratia marcescens NOT DETECTED NOT DETECTED Final   Haemophilus influenzae NOT DETECTED NOT DETECTED Final   Neisseria meningitidis NOT DETECTED NOT DETECTED Final   Pseudomonas aeruginosa NOT DETECTED NOT DETECTED Final   Candida albicans NOT DETECTED NOT DETECTED Final   Candida glabrata NOT DETECTED NOT DETECTED Final   Candida krusei NOT DETECTED NOT DETECTED Final   Candida parapsilosis NOT DETECTED NOT DETECTED Final   Candida tropicalis NOT DETECTED NOT DETECTED Final    Comment: Performed at [facility], [place] [address] [place] [address] [place], [place] [address]  Blood Culture (routine x 2)     Status: Abnormal (Preliminary result)   Collection Time: 03/27/18  7:35 PM  Result Value Ref Range Status   Specimen Description SITE NOT SPECIFIED  Final   Special Requests   Final    BOTTLES DRAWN AEROBIC AND ANAEROBIC Blood Culture adequate volume   Culture  Setup Time   Final    GRAM NEGATIVE RODS IN BOTH AEROBIC AND ANAEROBIC BOTTLES CRITICAL RESULT CALLED TO, READ BACK BY AND VERIFIED WITH: A. MASTERS, PHARMD AT 1310 ON 03/28/18 BY C. JESSUP, MLT.    Culture (A)  Final    KLEBSIELLA OXYTOCA SUSCEPTIBILITIES TO FOLLOW Performed at [name], [place] [address] [place], [place] [address]    Report Status PENDING  Incomplete  Blood Culture ID Panel  (Reflexed)     Status: Abnormal   Collection Time: 03/27/18  7:35 PM  Result Value Ref Range Status   Enterococcus species NOT DETECTED NOT DETECTED Final   Listeria monocytogenes NOT DETECTED NOT DETECTED Final   Staphylococcus species NOT DETECTED NOT DETECTED Final   Staphylococcus aureus NOT DETECTED NOT DETECTED Final   Streptococcus species NOT DETECTED NOT DETECTED Final   Streptococcus agalactiae NOT DETECTED NOT DETECTED Final   Streptococcus pneumoniae NOT DETECTED NOT DETECTED Final   Streptococcus pyogenes NOT DETECTED NOT DETECTED Final   Acinetobacter baumannii NOT DETECTED NOT DETECTED Final   Enterobacteriaceae species DETECTED (A) NOT DETECTED Final    Comment: Enterobacteriaceae represent a large family of gram-negative bacteria, not a single organism. CRITICAL RESULT CALLED TO, READ BACK BY AND VERIFIED WITH: A. MASTERS, PHARMD AT 1310 03/28/18 BY C. JESSUP, MLT.    Enterobacter cloacae complex NOT DETECTED NOT DETECTED Final   Escherichia coli NOT DETECTED NOT DETECTED Final   Klebsiella oxytoca DETECTED (A) NOT DETECTED Final    Comment: CRITICAL RESULT CALLED TO, READ BACK BY AND VERIFIED WITH: A. MASTERS, PHARMD AT 1310 03/28/18 BY C. JESSUP, MLT.    Klebsiella pneumoniae NOT DETECTED NOT DETECTED Final   Proteus species NOT DETECTED NOT DETECTED Final   Serratia marcescens NOT DETECTED NOT DETECTED Final   Carbapenem resistance NOT DETECTED NOT DETECTED Final   Haemophilus influenzae NOT DETECTED NOT DETECTED Final   Neisseria meningitidis NOT DETECTED NOT DETECTED Final   Pseudomonas aeruginosa NOT DETECTED NOT DETECTED Final   Candida albicans NOT DETECTED NOT DETECTED Final   Candida glabrata NOT DETECTED NOT DETECTED Final   Candida krusei NOT DETECTED NOT DETECTED Final   Candida parapsilosis NOT DETECTED NOT DETECTED Final   Candida tropicalis NOT DETECTED NOT DETECTED Final    Comment: Performed at [facility], [place] [address], [place], [place]  [address]  Urine culture  Status: Abnormal   Collection Time: 03/27/18  8:18 PM  Result Value Ref Range Status   Specimen Description URINE, CATHETERIZED  Final   Special Requests   Final    NONE Performed at [facility], [address] [place], [place] [address]    Culture >=100,000 COLONIES/mL PSEUDOMONAS AERUGINOSA (A)  Final   Report Status 03/30/2018 FINAL  Final   Organism ID, Bacteria PSEUDOMONAS AERUGINOSA (A)  Final      Susceptibility   Pseudomonas aeruginosa - MIC*    CEFTAZIDIME 4 SENSITIVE Sensitive     CIPROFLOXACIN <=0.25 SENSITIVE Sensitive     GENTAMICIN <=1 SENSITIVE Sensitive     IMIPENEM 2 SENSITIVE Sensitive     PIP/TAZO <=4 SENSITIVE Sensitive     CEFEPIME 2 SENSITIVE Sensitive     * >=100,000 COLONIES/mL PSEUDOMONAS AERUGINOSA  Culture, blood (Routine X 2) w Reflex to ID Panel     Status: None (Preliminary result)   Collection Time: 03/30/18 12:08 PM  Result Value Ref Range Status   Specimen Description BLOOD LEFT HAND  Final   Special Requests   Final    BOTTLES DRAWN AEROBIC ONLY Blood Culture adequate volume   Culture   Final    NO GROWTH 1 DAY Performed at [facility], [address], [place], [place] [address]    Report Status PENDING  Incomplete  Culture, blood (Routine X 2) w Reflex to ID Panel     Status: None (Preliminary result)   Collection Time: 03/30/18 12:11 PM  Result Value Ref Range Status   Specimen Description BLOOD SITE NOT SPECIFIED  Final   Special Requests   Final    BOTTLES DRAWN AEROBIC ONLY Blood Culture adequate volume   Culture   Final    NO GROWTH 1 DAY Performed at [facility], [place] [address] [place],  [address]    Report Status PENDING  Incomplete    Studies/Results: No results found.    Assessment/Plan:  INTERVAL HISTORY: Karrie Doffing call me from the lab and is concerned about how the blood cultures were collected that included the enterococcus.   Principal Problem:    Gram-negative bacteremia Active Problems:   Sepsis ([place])   Acute lower UTI   Elevated troponin   Pneumonia    Jaime [name] is a 77 y.o. male with patient for sepsis with blood cultures growing organisms  1.  Sepsis with gram-negative bacteremia : Note a Klebsiella oxytoca found on BCID and now on culture.  He also had Enterococcus on different BCID from anerobic culture but Marny Lowenstein from lab is suspicious re this specimen. Theory had been that urine might be source but urine has a minus aeruginosa and not Klebsiella.  Urine is therefore not the source of his bacteremia and we need to image his abdomen with a CT abdomen and pelvis.  I have ordered this today.  Continue Zosyn for now and follow-up imaging.    LOS: 4 days   Alcide Evener 03/31/2018, 3:30 PM

## 2018-03-31 NOTE — Progress Notes (Signed)
   03/31/18 1100  Clinical Encounter Type  Visited With Patient  Visit Type Initial  Referral From Nurse  Consult/Referral To [place] visited PT per request at 10:10am. Chaplain noticed PT was sleeping.

## 2018-04-01 DIAGNOSIS — J189 Pneumonia, unspecified organism: Secondary | ICD-10-CM

## 2018-04-01 DIAGNOSIS — Z89511 Acquired absence of right leg below knee: Secondary | ICD-10-CM

## 2018-04-01 DIAGNOSIS — Z89512 Acquired absence of left leg below knee: Secondary | ICD-10-CM

## 2018-04-01 DIAGNOSIS — R945 Abnormal results of liver function studies: Secondary | ICD-10-CM

## 2018-04-01 DIAGNOSIS — A4159 Other Gram-negative sepsis: Principal | ICD-10-CM

## 2018-04-01 LAB — CBC
HCT: 33.8 % — ABNORMAL LOW (ref 39.0–52.0)
Hemoglobin: 11.3 g/dL — ABNORMAL LOW (ref 13.0–17.0)
MCH: 26.3 pg (ref 26.0–34.0)
MCHC: 33.4 g/dL (ref 30.0–36.0)
MCV: 78.8 fL (ref 78.0–100.0)
PLATELETS: 206 10*3/uL (ref 150–400)
RBC: 4.29 MIL/uL (ref 4.22–5.81)
RDW: 17.1 % — AB (ref 11.5–15.5)
WBC: 5.1 10*3/uL (ref 4.0–10.5)

## 2018-04-01 LAB — CULTURE, BLOOD (ROUTINE X 2): Special Requests: ADEQUATE

## 2018-04-01 LAB — GLUCOSE, CAPILLARY: GLUCOSE-CAPILLARY: 161 mg/dL — AB (ref 70–99)

## 2018-04-01 MED ORDER — INSULIN ASPART 100 UNIT/ML ~~LOC~~ SOLN
0.0000 [IU] | Freq: Three times a day (TID) | SUBCUTANEOUS | Status: DC
  Administered 2018-04-02: 2 [IU] via SUBCUTANEOUS

## 2018-04-01 MED ORDER — INSULIN ASPART 100 UNIT/ML ~~LOC~~ SOLN: 0.0000 [IU] | Freq: Every day | SUBCUTANEOUS | Status: DC

## 2018-04-01 NOTE — Progress Notes (Signed)
PROGRESS NOTE    Jaime [name]  ZTI:[id number] DOB: Jun 27, 1941 DOA: 03/27/2018 PCP: [name], MD     Brief Narrative:  77 year old male with history of multiple sclerosis, hypertension, hyperlipidemia, type 2 diabetes mellitus, CAD status post DES, PVD, pulmonary hypertension, CHF EF 30 to 35% came with complaints of dysuria and fever.  Patient was started on empiric antibiotics for sepsis due to UTI.  Also had elevated troponin, cardiology was consulted.  Cardiology plan no further intervention, likely from demand ischemia.  Patient blood culture growing Klebsiella, urine culture growing Pseudomonas.  BC ID positive for enterococcus.  ID has been consulted.  No TEE has been recommended.  IV antibiotics changed from vancomycin and cefepime to IV Zosyn per ID.   Assessment & Plan: 1-sepsis with septic shock: Appears to be associated with UTI and bacteremia. -Pseudomonas and Klebsiella has been isolated as causing microorganism. -Continue IV Zosyn and if afebrile for 48 hours will discharge home on Levaquin to complete antibiotic therapy. -Repeat blood pressure has remained without growth -Anticipated antibiotic therapy by mouth will be 14 days. -Patient denies abdominal pain, dysuria, chest pain, shortness of breath or any other complaints currently.  2-elevated troponin -Most likely demand ischemia in the setting of sepsis -Flat elevation remains on his troponin with a trending down path -no ischemic work up recommended by cardiology service   3-type 2 diabetes mellitus with peripheral vascular disease -Continue sliding scale insulin -CBGs are stable and well-controlled.  4-hypertension -Blood pressure has remained well with control. -Continue low-dose lisinopril.  5-hyperlipidemia: -Continue statins.  6-transaminitis -Most likely associated with shock liver -Hepatitis panel negative -LFTs appropriately improving. -Continue to follow trend intermittently.  7-diabetes  neuropathy -Continue Lyrica.  8-coronary artery disease: -No chest pain no shortness of breath -Continue aspirin, lisinopril and statins.  9-GERD -Continue PPI.  DVT prophylaxis: Lovenox Code Status: Full code Family Communication: Wife at bedside Disposition Plan: Remains inpatient, if afebrile for 48 hours will transition antibiotics to by mouth regimen and discharge home.  Most likely home on Levaquin for another 14 days of treatment.  Stable to be transferred to [place].  Consultants:   ID  Cardiology   Procedures:   See below for x-ray reports.  Antimicrobials:  Anti-infectives (From admission, onward)   Start     Dose/Rate Route Frequency Ordered Stop   03/30/18 1400  piperacillin-tazobactam (ZOSYN) IVPB 3.375 g     3.375 g 12.5 mL/hr over 240 Minutes Intravenous Every 8 hours 03/30/18 1015     03/29/18 1600  ceFEPIme (MAXIPIME) 2 g in sodium chloride 0.9 % 100 mL IVPB  Status:  Discontinued     2 g 200 mL/hr over 30 Minutes Intravenous Every 8 hours 03/29/18 1500 03/30/18 1013   03/28/18 2200  vancomycin (VANCOCIN) IVPB 1000 mg/200 mL premix  Status:  Discontinued     1,000 mg 200 mL/hr over 60 Minutes Intravenous Every 12 hours 03/28/18 1725 03/30/18 1013   03/28/18 1430  cefTRIAXone (ROCEPHIN) 2 g in sodium chloride 0.9 % 100 mL IVPB  Status:  Discontinued     2 g 200 mL/hr over 30 Minutes Intravenous Every 24 hours 03/28/18 1331 03/29/18 1456   03/28/18 0900  vancomycin (VANCOCIN) IVPB 1000 mg/200 mL premix  Status:  Discontinued     1,000 mg 200 mL/hr over 60 Minutes Intravenous Every 12 hours 03/27/18 2100 03/28/18 1332   03/28/18 0600  ceFEPIme (MAXIPIME) 1 g in sodium chloride 0.9 % 100 mL IVPB  Status:  Discontinued     1 g 200 mL/hr over 30 Minutes Intravenous Every 8 hours 03/28/18 0317 03/28/18 0318   03/28/18 0400  ceFEPIme (MAXIPIME) 2 g in sodium chloride 0.9 % 100 mL IVPB  Status:  Discontinued     2 g 200 mL/hr over 30 Minutes Intravenous Every 8  hours 03/27/18 2100 03/28/18 1331   03/27/18 1930  ceFEPIme (MAXIPIME) 2 g in sodium chloride 0.9 % 100 mL IVPB     2 g 200 mL/hr over 30 Minutes Intravenous  Once 03/27/18 1925 03/27/18 2015   03/27/18 1930  metroNIDAZOLE (FLAGYL) IVPB 500 mg  Status:  Discontinued     500 mg 100 mL/hr over 60 Minutes Intravenous Every 8 hours 03/27/18 1925 03/28/18 0318   03/27/18 1930  vancomycin (VANCOCIN) IVPB 1000 mg/200 mL premix     1,000 mg 200 mL/hr over 60 Minutes Intravenous  Once 03/27/18 1925 03/27/18 2116      Subjective: Afebrile, no chest pain, no shortness of breath, no nausea, no vomiting. Patient is feeling better.   Objective: Vitals:   03/31/18 1807 03/31/18 2032 04/01/18 0300 04/01/18 1535  BP: 117/75 (!) 141/94  117/72  Pulse: 63 (!) 52 64 62  Resp: 17 15  18   Temp: 99.6 F (37.6 C) 98.8 F (37.1 C) 98.9 F (37.2 C) 98.4 F (36.9 C)  TempSrc: Oral Oral Oral Oral  SpO2: 97%   98%  Weight:      Height:        Intake/Output Summary (Last 24 hours) at 04/01/2018 1752 Last data filed at 04/01/2018 0305 Gross per 24 hour  Intake 120 ml  Output 2250 ml  Net -2130 ml   Filed Weights   03/29/18 0520 03/30/18 0400 03/31/18 0358  Weight: 66.1 kg 65.1 kg 67.1 kg    Examination: General exam: Alert, awake, oriented x 3; in no distress, denies CP and SOB. No abd pain, no nausea, no vomiting.  Respiratory system: Clear to auscultation. Respiratory effort normal. Cardiovascular system:RRR. No murmurs, rubs, gallops. Gastrointestinal system: Abdomen is nondistended, soft and nontender. No organomegaly or masses felt. Normal bowel sounds heard. Central nervous system: Alert and oriented. No focal neurological deficits. Extremities: s/p bilateral AKA; no ulcers on his stumps, no swelling on his thighs.  Skin: No rashes, lesions or ulcers Psychiatry: Judgement and insight appear normal. Mood & affect appropriate.    Data Reviewed: I have personally reviewed following labs and  imaging studies  CBC: Recent Labs  Lab 03/27/18 1923 03/28/18 0434 03/29/18 0426 03/30/18 0941 03/31/18 0832 04/01/18 0325  WBC 12.3* 13.0* 7.0 4.6 4.7 5.1  NEUTROABS 11.0*  --   --   --   --   --   HGB 13.9 11.9* 11.7* 11.2* 11.1* 11.3*  HCT 43.1 35.7* 35.3* 34.6* 33.3* 33.8*  MCV 80.9 78.8 78.8 81.6 79.1 78.8  PLT 190 162 161 143* 188 914   Basic Metabolic Panel: Recent Labs  Lab 03/27/18 1923 03/28/18 0434 03/29/18 0426 03/30/18 0941 03/31/18 0832  NA 137 137 138 137 139  K 3.7 3.7 3.8 3.7 3.5  CL 106 107 107 109 109  CO2 18* 21* 22 18* 23  GLUCOSE 232* 137* 139* 281* 117*  BUN 8 10 12 10 10   CREATININE 0.71 0.71 0.73 0.66 0.61  CALCIUM 8.8* 8.3* 8.2* 8.2* 8.6*   GFR: Estimated Creatinine Clearance: 74.6 mL/min (by C-G formula based on SCr of 0.61 mg/dL).   Liver Function Tests: Recent Labs  Lab  03/27/18 1923 03/28/18 0434 03/29/18 0426 03/30/18 0941 03/31/18 0832  AST 276* 156* 84* 59* 48*  ALT 259* 196* 143* 99* 87*  ALKPHOS 340* 259* 219* 197* 187*  BILITOT 3.7* 4.7* 2.8* 1.1 1.0  PROT 7.2 6.3* 6.3* 5.8* 6.1*  ALBUMIN 3.3* 2.7* 2.6* 2.4* 2.5*   Cardiac Enzymes: Recent Labs  Lab 03/28/18 0434 03/28/18 0732 03/28/18 1512  CKTOTAL 26*  --   --   CKMB 0.7  --   --   TROPONINI 0.05* 0.04* 0.03*   Urine analysis:    Component Value Date/Time   COLORURINE AMBER (A) 03/27/2018 2018   APPEARANCEUR CLEAR 03/27/2018 2018   LABSPEC 1.014 03/27/2018 2018   PHURINE 5.0 03/27/2018 2018   GLUCOSEU NEGATIVE 03/27/2018 2018   GLUCOSEU NEGATIVE 04/29/2017 1211   HGBUR LARGE (A) 03/27/2018 2018   BILIRUBINUR NEGATIVE 03/27/2018 2018   KETONESUR 5 (A) 03/27/2018 2018   PROTEINUR NEGATIVE 03/27/2018 2018   UROBILINOGEN 0.2 04/29/2017 1211   NITRITE POSITIVE (A) 03/27/2018 2018   LEUKOCYTESUR SMALL (A) 03/27/2018 2018    Recent Results (from the past 240 hour(s))  Blood Culture (routine x 2)     Status: None   Collection Time: 03/27/18  7:00 PM    Result Value Ref Range Status   Specimen Description BLOOD RIGHT WRIST  Final   Special Requests   Final    BOTTLES DRAWN AEROBIC AND ANAEROBIC Blood Culture results may not be optimal due to an inadequate volume of blood received in culture bottles   Culture  Setup Time   Final    GRAM NEGATIVE RODS AEROBIC BOTTLE ONLY CRITICAL RESULT CALLED TO, READ BACK BY AND VERIFIED WITH: A. MASTERS, PHARMD AT 1310 ON 03/28/18 BY C. JESSUP, MLT. CORRECTED RESULTS PREVIOUSLY REPORTED AS: GRAM POSITIVE COCCI IN PAIRS AND CHAINS ANAEROBIC BOTTLE ONLY CRITICAL RESULT CALLED TO, READ BACK BY AND VERIFIED WITH: A. MASTERS, PHARMD AT 1705 ON 03/28/18 BY C. JESSUP, MLT. Results Called to: A. MASTERS, PHARMD AT 0930 ON 03/29/18 BY C. JESSUP, MLT.    Culture   Final    GRAM NEGATIVE RODS CORRECTED RESULTS PREVIOUSLY REPORTED AS: GRAM POSITIVE COCCI DISREGARD RESULT. SPECIMEN IDENTITY CANNOT BE VERIFIED. TEST HAS BEEN CREDITED. 09 10 2019 BEAMJ Performed at [facility], [place] [address] [place], [place] [address]    Report Status 03/31/2018 FINAL  Final  Blood Culture ID Panel (Reflexed)     Status: Abnormal   Collection Time: 03/27/18  7:00 PM  Result Value Ref Range Status   Enterococcus species DETECTED (A) NOT DETECTED Final    Comment: CRITICAL RESULT CALLED TO, READ BACK BY AND VERIFIED WITH: A. MASTERS, PHARMD AT 1705 ON 03/28/18 BY C. JESSUP, MLT.    Vancomycin resistance NOT DETECTED NOT DETECTED Final   Listeria monocytogenes NOT DETECTED NOT DETECTED Final   Staphylococcus species NOT DETECTED NOT DETECTED Final   Staphylococcus aureus NOT DETECTED NOT DETECTED Final   Streptococcus species NOT DETECTED NOT DETECTED Final   Streptococcus agalactiae NOT DETECTED NOT DETECTED Final   Streptococcus pneumoniae NOT DETECTED NOT DETECTED Final   Streptococcus pyogenes NOT DETECTED NOT DETECTED Final   Acinetobacter baumannii NOT DETECTED NOT DETECTED Final   Enterobacteriaceae species NOT DETECTED  NOT DETECTED Final   Enterobacter cloacae complex NOT DETECTED NOT DETECTED Final   Escherichia coli NOT DETECTED NOT DETECTED Final   Klebsiella oxytoca NOT DETECTED NOT DETECTED Final   Klebsiella pneumoniae NOT DETECTED NOT DETECTED Final  Proteus species NOT DETECTED NOT DETECTED Final   Serratia marcescens NOT DETECTED NOT DETECTED Final   Haemophilus influenzae NOT DETECTED NOT DETECTED Final   Neisseria meningitidis NOT DETECTED NOT DETECTED Final   Pseudomonas aeruginosa NOT DETECTED NOT DETECTED Final   Candida albicans NOT DETECTED NOT DETECTED Final   Candida glabrata NOT DETECTED NOT DETECTED Final   Candida krusei NOT DETECTED NOT DETECTED Final   Candida parapsilosis NOT DETECTED NOT DETECTED Final   Candida tropicalis NOT DETECTED NOT DETECTED Final    Comment: Performed at [facility], [place] [address] [place], [place] [address]  Blood Culture (routine x 2)     Status: Abnormal   Collection Time: 03/27/18  7:35 PM  Result Value Ref Range Status   Specimen Description SITE NOT SPECIFIED  Final   Special Requests   Final    BOTTLES DRAWN AEROBIC AND ANAEROBIC Blood Culture adequate volume   Culture  Setup Time   Final    GRAM NEGATIVE RODS IN BOTH AEROBIC AND ANAEROBIC BOTTLES CRITICAL RESULT CALLED TO, READ BACK BY AND VERIFIED WITH: A. MASTERS, PHARMD AT 1310 ON 03/28/18 BY C. JESSUP, MLT. Performed at [facility], [place] [address] [place], [place] [address]    Culture KLEBSIELLA OXYTOCA (A)  Final   Report Status 04/01/2018 FINAL  Final   Organism ID, Bacteria KLEBSIELLA OXYTOCA  Final      Susceptibility   Klebsiella oxytoca - MIC*    AMPICILLIN >=32 RESISTANT Resistant     CEFAZOLIN 8 SENSITIVE Sensitive     CEFEPIME <=1 SENSITIVE Sensitive     CEFTAZIDIME <=1 SENSITIVE Sensitive     CEFTRIAXONE <=1 SENSITIVE Sensitive     CIPROFLOXACIN <=0.25 SENSITIVE Sensitive     GENTAMICIN <=1 SENSITIVE Sensitive     IMIPENEM <=0.25 SENSITIVE Sensitive      TRIMETH/SULFA <=20 SENSITIVE Sensitive     AMPICILLIN/SULBACTAM 8 SENSITIVE Sensitive     PIP/TAZO <=4 SENSITIVE Sensitive     Extended ESBL NEGATIVE Sensitive     * KLEBSIELLA OXYTOCA  Blood Culture ID Panel (Reflexed)     Status: Abnormal   Collection Time: 03/27/18  7:35 PM  Result Value Ref Range Status   Enterococcus species NOT DETECTED NOT DETECTED Final   Listeria monocytogenes NOT DETECTED NOT DETECTED Final   Staphylococcus species NOT DETECTED NOT DETECTED Final   Staphylococcus aureus NOT DETECTED NOT DETECTED Final   Streptococcus species NOT DETECTED NOT DETECTED Final   Streptococcus agalactiae NOT DETECTED NOT DETECTED Final   Streptococcus pneumoniae NOT DETECTED NOT DETECTED Final   Streptococcus pyogenes NOT DETECTED NOT DETECTED Final   Acinetobacter baumannii NOT DETECTED NOT DETECTED Final   Enterobacteriaceae species DETECTED (A) NOT DETECTED Final    Comment: Enterobacteriaceae represent a large family of gram-negative bacteria, not a single organism. CRITICAL RESULT CALLED TO, READ BACK BY AND VERIFIED WITH: A. MASTERS, PHARMD AT 1310 03/28/18 BY C. JESSUP, MLT.    Enterobacter cloacae complex NOT DETECTED NOT DETECTED Final   Escherichia coli NOT DETECTED NOT DETECTED Final   Klebsiella oxytoca DETECTED (A) NOT DETECTED Final    Comment: CRITICAL RESULT CALLED TO, READ BACK BY AND VERIFIED WITH: A. MASTERS, PHARMD AT 1310 03/28/18 BY C. JESSUP, MLT.    Klebsiella pneumoniae NOT DETECTED NOT DETECTED Final   Proteus species NOT DETECTED NOT DETECTED Final   Serratia marcescens NOT DETECTED NOT DETECTED Final   Carbapenem resistance NOT DETECTED NOT DETECTED Final   Haemophilus  influenzae NOT DETECTED NOT DETECTED Final   Neisseria meningitidis NOT DETECTED NOT DETECTED Final   Pseudomonas aeruginosa NOT DETECTED NOT DETECTED Final   Candida albicans NOT DETECTED NOT DETECTED Final   Candida glabrata NOT DETECTED NOT DETECTED Final   Candida krusei NOT  DETECTED NOT DETECTED Final   Candida parapsilosis NOT DETECTED NOT DETECTED Final   Candida tropicalis NOT DETECTED NOT DETECTED Final    Comment: Performed at [facility], [place] [address], [place], [place] [address]  Urine culture     Status: Abnormal   Collection Time: 03/27/18  8:18 PM  Result Value Ref Range Status   Specimen Description URINE, CATHETERIZED  Final   Special Requests   Final    NONE Performed at [facility], [place] [address], [place], [place] [address]    Culture >=100,000 COLONIES/mL PSEUDOMONAS AERUGINOSA (A)  Final   Report Status 03/30/2018 FINAL  Final   Organism ID, Bacteria PSEUDOMONAS AERUGINOSA (A)  Final      Susceptibility   Pseudomonas aeruginosa - MIC*    CEFTAZIDIME 4 SENSITIVE Sensitive     CIPROFLOXACIN <=0.25 SENSITIVE Sensitive     GENTAMICIN <=1 SENSITIVE Sensitive     IMIPENEM 2 SENSITIVE Sensitive     PIP/TAZO <=4 SENSITIVE Sensitive     CEFEPIME 2 SENSITIVE Sensitive     * >=100,000 COLONIES/mL PSEUDOMONAS AERUGINOSA  Culture, blood (Routine X 2) w Reflex to ID Panel     Status: None (Preliminary result)   Collection Time: 03/30/18 12:08 PM  Result Value Ref Range Status   Specimen Description BLOOD LEFT HAND  Final   Special Requests   Final    BOTTLES DRAWN AEROBIC ONLY Blood Culture adequate volume   Culture   Final    NO GROWTH 2 DAYS Performed at [facility], [address] [place], [place] [address]    Report Status PENDING  Incomplete  Culture, blood (Routine X 2) w Reflex to ID Panel     Status: None (Preliminary result)   Collection Time: 03/30/18 12:11 PM  Result Value Ref Range Status   Specimen Description BLOOD SITE NOT SPECIFIED  Final   Special Requests   Final    BOTTLES DRAWN AEROBIC ONLY Blood Culture adequate volume   Culture   Final    NO GROWTH 2 DAYS Performed at [facility], [address], [place], [place] [address]    Report Status PENDING  Incomplete     Radiology  Studies: Ct Abdomen Pelvis W Contrast  Result Date: 03/31/2018 CLINICAL DATA:  Bacteremia EXAM: CT ABDOMEN AND PELVIS WITH CONTRAST TECHNIQUE: Multidetector CT imaging of the abdomen and pelvis was performed using the standard protocol following bolus administration of intravenous contrast. CONTRAST:  159mL ISOVUE-300 IOPAMIDOL (ISOVUE-300) INJECTION 61% COMPARISON:  Ultrasound 03/28/2018, CT 10/31/2014 FINDINGS: Lower chest: Lung bases demonstrate dependent atelectasis in the left greater than right lung base. Mild cardiomegaly. Coronary vascular calcification. Hepatobiliary: No focal liver abnormality is seen. Status post cholecystectomy. No biliary dilatation. Pancreas: Unremarkable. No pancreatic ductal dilatation or surrounding inflammatory changes. Spleen: Normal in size without focal abnormality. Adrenals/Urinary Tract: Adrenal glands are within normal limits. No hydronephrosis. Cysts within the bilateral kidneys. The bladder is unremarkable Stomach/Bowel: Stomach is nonenlarged. No dilated small bowel. No colon wall thickening. Negative appendix. Moderate retained feces in the rectosigmoid colon. Vascular/Lymphatic: Tortuous calcified aorta with extensive mural thrombus. 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 femoral bypass graft without  definitive flow in the imaged portions of the proximal graft at the groin. No significantly enlarged lymph nodes. Reproductive: Slightly enlarged prostate with calcification Other: No free air or free fluid. Edema within the subcutaneous soft tissues. Musculoskeletal: Bony erosive changes at the sacrococcygeal region. No overlying fluid collections or significant edema. IMPRESSION: 1. No definite CT evidence for acute intra-abdominal or pelvic abnormality. Negative for appendicitis or bowel wall thickening. Moderate stool in the colon. 2. 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 Status post left femoral bypass  without demonstrable flow in the imaged portions of the proximal left graft, similar compared to prior study. 3. Bony erosive changes and remodeling of the sacrococcygeal region, may be due to pressure related changes or sequela of prior osteomyelitis. Electronically Signed   By: Donavan Foil M.D.   On: 03/31/2018 19:19   Scheduled Meds: . aspirin EC  81 mg Oral Daily  . cholecalciferol  2,000 Units Oral Daily  . enoxaparin (LOVENOX) injection  40 mg Subcutaneous Q24H  . lisinopril  10 mg Oral Daily  . multivitamin with minerals  1 tablet Oral Daily  . pantoprazole  40 mg Oral Q lunch  . polyvinyl alcohol  1-2 drop Both Eyes Daily  . pregabalin  150 mg Oral Daily  . pregabalin  300 mg Oral QHS  . rosuvastatin  20 mg Oral QHS   Continuous Infusions: . piperacillin-tazobactam (ZOSYN)  IV 3.375 g (04/01/18 1351)     LOS: 5 days    Time spent: 30 minutes    Barton Dubois, MD [facility] Pager [phone]  If 7PM-7AM, please contact night-coverage [email or web address] Password [facility] 04/01/2018, 5:52 PM

## 2018-04-01 NOTE — Progress Notes (Signed)
Subjective: No new complaints  Antibiotics:  Anti-infectives (From admission, onward)   Start     Dose/Rate Route Frequency Ordered Stop   03/30/18 1400  piperacillin-tazobactam (ZOSYN) IVPB 3.375 g     3.375 g 12.5 mL/hr over 240 Minutes Intravenous Every 8 hours 03/30/18 1015     03/29/18 1600  ceFEPIme (MAXIPIME) 2 g in sodium chloride 0.9 % 100 mL IVPB  Status:  Discontinued     2 g 200 mL/hr over 30 Minutes Intravenous Every 8 hours 03/29/18 1500 03/30/18 1013   03/28/18 2200  vancomycin (VANCOCIN) IVPB 1000 mg/200 mL premix  Status:  Discontinued     1,000 mg 200 mL/hr over 60 Minutes Intravenous Every 12 hours 03/28/18 1725 03/30/18 1013   03/28/18 1430  cefTRIAXone (ROCEPHIN) 2 g in sodium chloride 0.9 % 100 mL IVPB  Status:  Discontinued     2 g 200 mL/hr over 30 Minutes Intravenous Every 24 hours 03/28/18 1331 03/29/18 1456   03/28/18 0900  vancomycin (VANCOCIN) IVPB 1000 mg/200 mL premix  Status:  Discontinued     1,000 mg 200 mL/hr over 60 Minutes Intravenous Every 12 hours 03/27/18 2100 03/28/18 1332   03/28/18 0600  ceFEPIme (MAXIPIME) 1 g in sodium chloride 0.9 % 100 mL IVPB  Status:  Discontinued     1 g 200 mL/hr over 30 Minutes Intravenous Every 8 hours 03/28/18 0317 03/28/18 0318   03/28/18 0400  ceFEPIme (MAXIPIME) 2 g in sodium chloride 0.9 % 100 mL IVPB  Status:  Discontinued     2 g 200 mL/hr over 30 Minutes Intravenous Every 8 hours 03/27/18 2100 03/28/18 1331   03/27/18 1930  ceFEPIme (MAXIPIME) 2 g in sodium chloride 0.9 % 100 mL IVPB     2 g 200 mL/hr over 30 Minutes Intravenous  Once 03/27/18 1925 03/27/18 2015   03/27/18 1930  metroNIDAZOLE (FLAGYL) IVPB 500 mg  Status:  Discontinued     500 mg 100 mL/hr over 60 Minutes Intravenous Every 8 hours 03/27/18 1925 03/28/18 0318   03/27/18 1930  vancomycin (VANCOCIN) IVPB 1000 mg/200 mL premix     1,000 mg 200 mL/hr over 60 Minutes Intravenous  Once 03/27/18 1925 03/27/18 2116       Medications: Scheduled Meds: . aspirin EC  81 mg Oral Daily  . cholecalciferol  2,000 Units Oral Daily  . enoxaparin (LOVENOX) injection  40 mg Subcutaneous Q24H  . lisinopril  10 mg Oral Daily  . multivitamin with minerals  1 tablet Oral Daily  . pantoprazole  40 mg Oral Q lunch  . polyvinyl alcohol  1-2 drop Both Eyes Daily  . pregabalin  150 mg Oral Daily  . pregabalin  300 mg Oral QHS  . rosuvastatin  20 mg Oral QHS   Continuous Infusions: . piperacillin-tazobactam (ZOSYN)  IV 3.375 g (04/01/18 1351)   PRN Meds:.albuterol, traMADol, zolpidem    Objective: Weight change:   Intake/Output Summary (Last 24 hours) at 04/01/2018 1417 Last data filed at 04/01/2018 0305 Gross per 24 hour  Intake 245.63 ml  Output 3850 ml  Net -3604.37 ml   Blood pressure (!) 141/94, pulse 64, temperature 98.9 F (37.2 C), temperature source Oral, resp. rate 15, height 5\' 11"  (1.803 m), weight 67.1 kg, SpO2 97 %. Temp:  [98.8 F (37.1 C)-99.6 F (37.6 C)] 98.9 F (37.2 C) (09/11 0300) Pulse Rate:  [52-64] 64 (09/11 0300) Resp:  [15-17] 15 (09/10 2032) BP: (117-141)/(75-94) 141/94 (  09/10 2032) SpO2:  [97 %] 97 % (09/10 1807)  Physical Exam: General: Alert and awake, oriented x3, not in any acute distress. HEENT: anicteric sclera, EOMI CVS regular rate, normal , no mgr Chest: , no wheezing, no respiratory distress Abdomen: soft to palpation of the right upper and lower quadrants positive bowel sounds no rebound tenderness. 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 C-Reactive Protein No results for input(s): CRP in the last 72 hours.  Micro Results: Recent Results (from the past 720 hour(s))  Blood Culture (routine x 2)     Status: None   Collection Time: 03/27/18  7:00 PM  Result Value Ref Range Status   Specimen Description BLOOD RIGHT WRIST  Final   Special Requests   Final    BOTTLES DRAWN AEROBIC AND ANAEROBIC Blood Culture results may not be optimal due to an inadequate volume of blood received in culture bottles   Culture  Setup Time   Final    GRAM NEGATIVE RODS AEROBIC BOTTLE ONLY CRITICAL RESULT CALLED TO, READ BACK BY AND VERIFIED WITH: A. MASTERS, PHARMD AT 1310 ON 03/28/18 BY C. JESSUP, MLT. CORRECTED RESULTS PREVIOUSLY REPORTED AS: GRAM POSITIVE COCCI IN PAIRS AND CHAINS ANAEROBIC BOTTLE ONLY CRITICAL RESULT CALLED TO, READ BACK BY AND VERIFIED WITH: A. MASTERS, PHARMD AT 1705 ON 03/28/18 BY C. JESSUP, MLT. Results Called to: A. MASTERS, PHARMD AT 0930 ON 03/29/18 BY C. JESSUP, MLT.    Culture   Final    GRAM NEGATIVE RODS CORRECTED RESULTS PREVIOUSLY REPORTED AS: GRAM POSITIVE COCCI DISREGARD RESULT. SPECIMEN IDENTITY CANNOT BE VERIFIED. TEST HAS BEEN CREDITED. 09 10 2019 BEAMJ Performed at [facility], [place] [address] [place], [place] [address]    Report Status 03/31/2018 FINAL  Final  Blood Culture ID Panel (Reflexed)     Status: Abnormal   Collection Time: 03/27/18  7:00 PM  Result Value Ref Range Status   Enterococcus species DETECTED (A) NOT DETECTED Final    Comment: CRITICAL RESULT CALLED TO, READ BACK BY AND VERIFIED WITH: A. MASTERS, PHARMD AT 1705 ON 03/28/18 BY C. JESSUP, MLT.    Vancomycin resistance NOT DETECTED NOT DETECTED Final   Listeria monocytogenes NOT DETECTED NOT DETECTED Final   Staphylococcus species NOT DETECTED NOT DETECTED Final   Staphylococcus aureus NOT  DETECTED NOT DETECTED Final   Streptococcus species NOT DETECTED NOT DETECTED Final   Streptococcus agalactiae NOT DETECTED NOT DETECTED Final   Streptococcus pneumoniae NOT DETECTED NOT DETECTED Final   Streptococcus pyogenes NOT DETECTED NOT DETECTED Final   Acinetobacter baumannii NOT DETECTED NOT DETECTED Final   Enterobacteriaceae species NOT DETECTED NOT DETECTED Final   Enterobacter cloacae complex NOT DETECTED NOT DETECTED Final   Escherichia coli NOT DETECTED NOT  DETECTED Final   Klebsiella oxytoca NOT DETECTED NOT DETECTED Final   Klebsiella pneumoniae NOT DETECTED NOT DETECTED Final   Proteus species NOT DETECTED NOT DETECTED Final   Serratia marcescens NOT DETECTED NOT DETECTED Final   Haemophilus influenzae NOT DETECTED NOT DETECTED Final   Neisseria meningitidis NOT DETECTED NOT DETECTED Final   Pseudomonas aeruginosa NOT DETECTED NOT DETECTED Final   Candida albicans NOT DETECTED NOT DETECTED Final   Candida glabrata NOT DETECTED NOT DETECTED Final   Candida krusei NOT DETECTED NOT DETECTED Final   Candida parapsilosis NOT DETECTED NOT DETECTED Final   Candida tropicalis NOT DETECTED NOT DETECTED Final    Comment: Performed at [facility], [place] [address] [place], [place] [address]  Blood Culture (routine x 2)     Status: Abnormal   Collection Time: 03/27/18  7:35 PM  Result Value Ref Range Status   Specimen Description SITE NOT SPECIFIED  Final   Special Requests   Final    BOTTLES DRAWN AEROBIC AND ANAEROBIC Blood Culture adequate volume   Culture  Setup Time   Final    GRAM NEGATIVE RODS IN BOTH AEROBIC AND ANAEROBIC BOTTLES CRITICAL RESULT CALLED TO, READ BACK BY AND VERIFIED WITH: A. MASTERS, PHARMD AT 1310 ON 03/28/18 BY C. JESSUP, MLT. Performed at [facility], [place] [address] [place], [place] [address]    Culture KLEBSIELLA OXYTOCA (A)  Final   Report Status 04/01/2018 FINAL  Final   Organism ID, Bacteria KLEBSIELLA OXYTOCA  Final       Susceptibility   Klebsiella oxytoca - MIC*    AMPICILLIN >=32 RESISTANT Resistant     CEFAZOLIN 8 SENSITIVE Sensitive     CEFEPIME <=1 SENSITIVE Sensitive     CEFTAZIDIME <=1 SENSITIVE Sensitive     CEFTRIAXONE <=1 SENSITIVE Sensitive     CIPROFLOXACIN <=0.25 SENSITIVE Sensitive     GENTAMICIN <=1 SENSITIVE Sensitive     IMIPENEM <=0.25 SENSITIVE Sensitive     TRIMETH/SULFA <=20 SENSITIVE Sensitive     AMPICILLIN/SULBACTAM 8 SENSITIVE Sensitive     PIP/TAZO <=4 SENSITIVE Sensitive     Extended ESBL NEGATIVE Sensitive     * KLEBSIELLA OXYTOCA  Blood Culture ID Panel (Reflexed)     Status: Abnormal   Collection Time: 03/27/18  7:35 PM  Result Value Ref Range Status   Enterococcus species NOT DETECTED NOT DETECTED Final   Listeria monocytogenes NOT DETECTED NOT DETECTED Final   Staphylococcus species NOT DETECTED NOT DETECTED Final   Staphylococcus aureus NOT DETECTED NOT DETECTED Final   Streptococcus species NOT DETECTED NOT DETECTED Final   Streptococcus agalactiae NOT DETECTED NOT DETECTED Final   Streptococcus pneumoniae NOT DETECTED NOT DETECTED Final   Streptococcus pyogenes NOT DETECTED NOT DETECTED Final   Acinetobacter baumannii NOT DETECTED NOT DETECTED Final   Enterobacteriaceae species DETECTED (A) NOT DETECTED Final    Comment: Enterobacteriaceae represent a large family of gram-negative bacteria, not a single organism. CRITICAL RESULT CALLED TO, READ BACK BY AND VERIFIED WITH: A. MASTERS, PHARMD AT 1310 03/28/18 BY C. JESSUP, MLT.    Enterobacter cloacae complex NOT DETECTED NOT DETECTED Final   Escherichia coli NOT DETECTED NOT DETECTED Final   Klebsiella oxytoca DETECTED (A) NOT DETECTED Final    Comment: CRITICAL RESULT CALLED TO, READ BACK BY AND VERIFIED WITH: A. MASTERS, PHARMD AT 1310 03/28/18 BY C. JESSUP, MLT.    Klebsiella pneumoniae NOT DETECTED NOT DETECTED Final   Proteus species NOT DETECTED NOT DETECTED  Final   Serratia marcescens NOT DETECTED NOT  DETECTED Final   Carbapenem resistance NOT DETECTED NOT DETECTED Final   Haemophilus influenzae NOT DETECTED NOT DETECTED Final   Neisseria meningitidis NOT DETECTED NOT DETECTED Final   Pseudomonas aeruginosa NOT DETECTED NOT DETECTED Final   Candida albicans NOT DETECTED NOT DETECTED Final   Candida glabrata NOT DETECTED NOT DETECTED Final   Candida krusei NOT DETECTED NOT DETECTED Final   Candida parapsilosis NOT DETECTED NOT DETECTED Final   Candida tropicalis NOT DETECTED NOT DETECTED Final    Comment: Performed at [facility], [place] [address], [place], [place] [address]  Urine culture     Status: Abnormal   Collection Time: 03/27/18  8:18 PM  Result Value Ref Range Status   Specimen Description URINE, CATHETERIZED  Final   Special Requests   Final    NONE Performed at [facility], [place] [address], [place], [place] [address]    Culture >=100,000 COLONIES/mL PSEUDOMONAS AERUGINOSA (A)  Final   Report Status 03/30/2018 FINAL  Final   Organism ID, Bacteria PSEUDOMONAS AERUGINOSA (A)  Final      Susceptibility   Pseudomonas aeruginosa - MIC*    CEFTAZIDIME 4 SENSITIVE Sensitive     CIPROFLOXACIN <=0.25 SENSITIVE Sensitive     GENTAMICIN <=1 SENSITIVE Sensitive     IMIPENEM 2 SENSITIVE Sensitive     PIP/TAZO <=4 SENSITIVE Sensitive     CEFEPIME 2 SENSITIVE Sensitive     * >=100,000 COLONIES/mL PSEUDOMONAS AERUGINOSA  Culture, blood (Routine X 2) w Reflex to ID Panel     Status: None (Preliminary result)   Collection Time: 03/30/18 12:08 PM  Result Value Ref Range Status   Specimen Description BLOOD LEFT HAND  Final   Special Requests   Final    BOTTLES DRAWN AEROBIC ONLY Blood Culture adequate volume   Culture   Final    NO GROWTH 2 DAYS Performed at [facility], [address] [place], [place] [address]    Report Status PENDING  Incomplete  Culture, blood (Routine X 2) w Reflex to ID Panel     Status: None (Preliminary result)   Collection Time:  03/30/18 12:11 PM  Result Value Ref Range Status   Specimen Description BLOOD SITE NOT SPECIFIED  Final   Special Requests   Final    BOTTLES DRAWN AEROBIC ONLY Blood Culture adequate volume   Culture   Final    NO GROWTH 2 DAYS Performed at [facility], [address], [place], [place] [address]    Report Status PENDING  Incomplete    Studies/Results: Ct Abdomen Pelvis W Contrast  Result Date: 03/31/2018 CLINICAL DATA:  Bacteremia EXAM: CT ABDOMEN AND PELVIS WITH CONTRAST TECHNIQUE: Multidetector CT imaging of the abdomen and pelvis was performed using the standard protocol following bolus administration of intravenous contrast. CONTRAST:  138mL ISOVUE-300 IOPAMIDOL (ISOVUE-300) INJECTION 61% COMPARISON:  Ultrasound 03/28/2018, CT 10/31/2014 FINDINGS: Lower chest: Lung bases demonstrate dependent atelectasis in the left greater than right lung base. Mild cardiomegaly. Coronary vascular calcification. Hepatobiliary: No focal liver abnormality is seen. Status post cholecystectomy. No biliary dilatation. Pancreas: Unremarkable. No pancreatic ductal dilatation or surrounding inflammatory changes. Spleen: Normal in size without focal abnormality. Adrenals/Urinary Tract: Adrenal glands are within normal limits. No hydronephrosis. Cysts within the bilateral kidneys. The bladder is unremarkable Stomach/Bowel: Stomach is nonenlarged. No dilated small bowel. No colon wall thickening. Negative appendix. Moderate retained feces in the rectosigmoid colon. 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 No significantly enlarged lymph nodes. Reproductive: Slightly enlarged prostate with calcification Other: No free air or free fluid. Edema within the subcutaneous soft tissues. Musculoskeletal: Bony erosive changes at the  sacrococcygeal region. No overlying fluid collections or significant edema. IMPRESSION: 1. No definite CT evidence for acute intra-abdominal or pelvic abnormality. Negative for appendicitis or bowel wall thickening. Moderate stool in the colon. 2. 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 Status post left femoral bypass without demonstrable flow in the imaged portions of the proximal left graft, similar compared to prior study. 3. Bony erosive changes and remodeling of the sacrococcygeal region, may be due to pressure related changes or sequela of prior osteomyelitis. Electronically Signed   By: Donavan Foil M.D.   On: 03/31/2018 19:19      Assessment/Plan:  INTERVAL HISTORY: Klebsiella oxytoca from blood cultures 2/2  Principal Problem:   Gram-negative bacteremia Active Problems:   Sepsis (HCC)   Acute lower UTI   Elevated troponin   Pneumonia   Abnormal liver function    Jaime [name] is a 77 y.o. male with patient for sepsis with blood cultures growing organisms   1.  Sepsis with Klebsiella oxytoca found on BCID and now on culture.  He also had Enterococcus on different BCID from anerobic culture but Marny Lowenstein from lab is suspicious re this specimen  Urine is therefore not the source of his bacteremia since pt grew Pseudomonas from that site  CT did not show intrabdominal source it did show bone remodelling but there is nothing at the bedside to suggest osteomyelitis  Would continue zosyn in house and then step down to oral augmentin to complete a total of 14 days  (would not go shorter since removable source not found)  I will sign off for now   Please call with further questions.    LOS: 5 days   Alcide Evener 04/01/2018, 2:17 PM

## 2018-04-01 NOTE — Care Management Important Message (Signed)
Important Message  Patient Details  Name: Jaime [name] MRN: [id number] Date of Birth: [birth year]   Medicare Important Message Given:  Yes    Barb Merino Theone Bowell 04/01/2018, 2:31 PM

## 2018-04-02 DIAGNOSIS — R6521 Severe sepsis with septic shock: Secondary | ICD-10-CM

## 2018-04-02 LAB — GLUCOSE, CAPILLARY
Glucose-Capillary: 120 mg/dL — ABNORMAL HIGH (ref 70–99)
Glucose-Capillary: 140 mg/dL — ABNORMAL HIGH (ref 70–99)

## 2018-04-02 LAB — CBC
HCT: 36.7 % — ABNORMAL LOW (ref 39.0–52.0)
Hemoglobin: 11.9 g/dL — ABNORMAL LOW (ref 13.0–17.0)
MCH: 25.9 pg — AB (ref 26.0–34.0)
MCHC: 32.4 g/dL (ref 30.0–36.0)
MCV: 79.8 fL (ref 78.0–100.0)
PLATELETS: 259 10*3/uL (ref 150–400)
RBC: 4.6 MIL/uL (ref 4.22–5.81)
RDW: 17.1 % — AB (ref 11.5–15.5)
WBC: 7.9 10*3/uL (ref 4.0–10.5)

## 2018-04-02 MED ORDER — SACCHAROMYCES BOULARDII 250 MG PO CAPS: 250.0000 mg | ORAL_CAPSULE | Freq: Two times a day (BID) | ORAL | 0 refills | Status: DC

## 2018-04-02 MED ORDER — LEVOFLOXACIN 750 MG PO TABS: 750.0000 mg | ORAL_TABLET | Freq: Every day | ORAL | 0 refills | Status: AC

## 2018-04-02 NOTE — Progress Notes (Signed)
Patient and wife given discharge instructions all questions answered. RN assisted wife getting patient dressed and into home power chair with the lift. Belongings and prescriptions sent with patient. PIV x2 removed.

## 2018-04-02 NOTE — Discharge Summary (Signed)
Physician Discharge Summary  Jaime [name] ZMO:[id number] DOB: 12-28-1940 DOA: 03/27/2018  PCP: [name], MD  Admit date: 03/27/2018 Discharge date: 04/02/2018  Time spent: 35 minutes  Recommendations for Outpatient Follow-up:  Repeat CMET to follow electrolytes, LFT's and renal function trend. Reassess BP and adjust antihypertensive regimen as needed.  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 Discharge home with instructions to follow up with PCP in 10 days.  Diet recommendation: heart healthy and modified carb diet.  Filed Weights   03/29/18 0520 03/30/18 0400 03/31/18 0358  Weight: 66.1 kg 65.1 kg 67.1 kg    Brief History of present illness:  77 y.o. male, w multiple sclerosis, hypertension, hyperlipidemia, dm2,  CAD s/p DES, PVD, pulmonary hypertension, CHF (EF 30-35%), apparently presents with c/o dysuria and fever and slight dry cough. Pt notes nausea, but no emesis, and slight epigastric discomfort.  Pt denies diarrhea, brbpr, black stool.   Hospital Course:  1-sepsis with septic shock: Appears to be associated with UTI and bacteremia. -Pseudomonas and Klebsiella has been isolated as causing microorganism. -patient treated qith broad spectrum antibiotics and fluid resuscitation. Following cx's results his antibiotics were narrowed to zosyn and kept on treatment until 48 hours w/o fever. -2-D echo w/o vegetations. -per ID rec's will complete a total of 14 days of antibiotics. 10 more days at discharge; patient sent home on Levaquin (microorganisms sensitive to abx) -Repeat blood blood cx without growth prior to discharge -sepsis features resolved. -base on patient's work up and symptoms, PNA  process was rule out.  2-elevated troponin -Most likely demand ischemia in the setting of sepsis -Flat elevation remains on his troponin with a trending down path -no ischemic work up recommended by cardiology service. -continue risk factors modifications.   3-type 2 diabetes mellitus with peripheral vascular disease -Continue home hypoglycemic regimen -modified carb diet recommended.  4-hypertension -Blood pressure has remained well controlled after septic shock resolved. -Continue home antihypertensive agents at discharge.  5-hyperlipidemia: -Continue statins.  6-transaminitis -Most likely associated with shock liver -Hepatitis panel negative -LFTs appropriately trending down/improving. -Repeat LFT's at follow up visit to follow trend.  7-diabetic neuropathy -Continue Lyrica.  8-hx of coronary artery disease: -No chest pain no shortness of breath -Continue aspirin, lisinopril and statins. -as mentioned above no ischemic work up recommended by cardiology.  9-GERD -Continue PPI.  Procedures:  See below for x-ray reports   Consultations:  ID  Cardiology   Discharge Exam: Vitals:   04/02/18 0435 04/02/18 0918  BP: (!) 177/91 (!) 168/140  Pulse: 63   Resp: 19 18  Temp: 98.9 F (37.2 C) 98.9 F (37.2 C)  SpO2: 96% 98%   General exam: Alert, awake, oriented x 3; in no distress, denies CP and SOB. No abd pain, no nausea, no vomiting.  Respiratory system: Clear to auscultation. Respiratory effort normal. Cardiovascular system:RRR. No murmurs, rubs, gallops. Gastrointestinal system: Abdomen is nondistended, soft and nontender. No organomegaly or masses felt. Normal bowel sounds heard. Central nervous system: Alert and oriented. No focal neurological deficits. Extremities: s/p bilateral AKA; no ulcers on his stumps, no swelling on his thighs.  Skin: No rashes, lesions or ulcers Psychiatry: Judgement and insight  appear normal. Mood & affect appropriate.     Discharge Instructions   Discharge Instructions    Discharge instructions   Complete by:  As directed    Follow up with PCP in 10 days Take medications as prescribed  Maintain adequate hydration  Follow heart healthy/low sodium and modified carbohydrates diet     Allergies as of 04/02/2018      Reactions   Bee Venom Anaphylaxis   Beta Adrenergic Blockers Other (See Comments)   Bradycardia with Carvedilol   Carvedilol Other (See Comments)   Bradycardia with all Beta blockers    Influenza Vaccines Other (See Comments)   Made patient "sick for months"   Atorvastatin Other (See Comments)   Severe pain in stumps   Latex Rash      Medication List    TAKE these medications   albuterol 108 (90 Base) MCG/ACT inhaler Commonly known as:  PROVENTIL HFA;VENTOLIN HFA Inhale 2 puffs into the lungs every 6 (six) hours as needed for wheezing or shortness of breath.   aspirin EC 81 MG tablet Take 1 tablet (81 mg total) by mouth daily.   azelastine 0.1 % nasal spray Commonly known as:  ASTELIN Place 2 sprays into both nostrils at bedtime as needed for rhinitis. Use in each nostril as directed   diclofenac sodium 1 % Gel Commonly known as:  VOLTAREN Apply 1 application topically 2 (two) times daily as needed (FOR PAIN).   furosemide 20 MG tablet Commonly known as:  LASIX Take 1 tablet (20 mg total) by mouth daily as needed for fluid or edema (or shortness of breath).   GLUCAGEN 1 MG Solr injection Generic drug:  glucagon Inject 1 mg into the muscle once as needed for low blood sugar. Reported on 11/16/2015   guaiFENesin 100 MG/5ML Soln Commonly known as:  ROBITUSSIN Take 5 mLs by mouth every 4 (four) hours as needed for cough or to loosen phlegm.   insulin aspart protamine - aspart (70-30) 100 UNIT/ML FlexPen Commonly known as:  NOVOLOG 70/30 MIX Inject 0.05 mLs (5 Units total) into the skin 2 (two) times daily. What changed:    when to take this  reasons to take  this   levofloxacin 750 MG tablet Commonly known as:  LEVAQUIN Take 1 tablet (750 mg total) by mouth daily for 10 days.   lisinopril 10 MG tablet Commonly known as:  PRINIVIL,ZESTRIL TAKE 1 TABLET BY MOUTH EVERY DAY   multivitamin with minerals tablet Take 1 tablet by mouth daily.   nicotine 14 mg/24hr patch Commonly known as:  NICODERM CQ - dosed in mg/24 hours Place 1 patch (14 mg total) onto the skin daily. What changed:    when to take this  reasons to take this   pantoprazole 40 MG tablet Commonly known as:  PROTONIX Take 1 tablet (40 mg total) by mouth daily with lunch.   pregabalin 150 MG capsule Commonly known as:  LYRICA Take 150-300 mg by mouth See admin instructions. Take 150 mg by mouth in the morning and 300 mg at bedtime What changed:  Another medication with the same name was removed. Continue taking this medication, and follow the directions you see here.   rosuvastatin 20 MG tablet Commonly known as:  CRESTOR Take 1 tablet (20 mg total) by mouth daily. What changed:  when to take this   saccharomyces boulardii 250 MG capsule Commonly known as:  FLORASTOR Take 1 capsule (250 mg total) by mouth 2 (two) times daily.  SYSTANE BALANCE OP Place 1-2 drops into both eyes daily.   traMADol 50 MG tablet Commonly known as:  ULTRAM Take 1 tablet (50 mg total) by mouth every 4 (four) hours as needed. What changed:  reasons to take this   Vitamin D-3 1000 units Caps Take 2,000 Units by mouth daily.      Allergies  Allergen Reactions  . Bee Venom Anaphylaxis  . Beta Adrenergic Blockers Other (See Comments)    Bradycardia with Carvedilol   . Carvedilol Other (See Comments)    Bradycardia with all Beta blockers   . Influenza Vaccines Other (See Comments)    Made patient "sick for months"  . Atorvastatin Other (See Comments)    Severe pain in stumps  . Latex Rash   Follow-up Information    [name], MD. Schedule an appointment as soon as  possible for a visit in 2 week(s).   Specialty:  Internal Medicine Contact information: [place] [address] [phone]        Dorothy Spark, MD .   Specialty:  Cardiology Contact information: [place] [address] [place] [address] [phone]            The results of significant diagnostics from this hospitalization (including imaging, microbiology, ancillary and laboratory) are listed below for reference.    Significant Diagnostic Studies: Ct Abdomen Pelvis W Contrast  Result Date: 03/31/2018 CLINICAL DATA:  Bacteremia EXAM: CT ABDOMEN AND PELVIS WITH CONTRAST TECHNIQUE: Multidetector CT imaging of the abdomen and pelvis was performed using the standard protocol following bolus administration of intravenous contrast. CONTRAST:  18mL ISOVUE-300 IOPAMIDOL (ISOVUE-300) INJECTION 61% COMPARISON:  Ultrasound 03/28/2018, CT 10/31/2014 FINDINGS: Lower chest: Lung bases demonstrate dependent atelectasis in the left greater than right lung base. Mild cardiomegaly. Coronary vascular calcification. Hepatobiliary: No focal liver abnormality is seen. Status post cholecystectomy. No biliary dilatation. Pancreas: Unremarkable. No pancreatic ductal dilatation or surrounding inflammatory changes. Spleen: Normal in size without focal abnormality. Adrenals/Urinary Tract: Adrenal glands are within normal limits. No hydronephrosis. Cysts within the bilateral kidneys. The bladder is unremarkable Stomach/Bowel: Stomach is nonenlarged. No dilated small bowel. No colon wall thickening. Negative appendix. Moderate retained feces in the rectosigmoid colon. Vascular/Lymphatic: Tortuous calcified aorta with extensive mural thrombus. 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 femoral bypass graft without definitive flow in the imaged portions of the proximal graft at the groin. No significantly enlarged lymph nodes. Reproductive: Slightly enlarged prostate with  calcification Other: No free air or free fluid. Edema within the subcutaneous soft tissues. Musculoskeletal: Bony erosive changes at the sacrococcygeal region. No overlying fluid collections or significant edema. IMPRESSION: 1. No definite CT evidence for acute intra-abdominal or pelvic abnormality. Negative for appendicitis or bowel wall thickening. Moderate stool in the colon. 2. 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 Status post left femoral bypass without demonstrable flow in the imaged portions of the proximal left graft, similar compared to prior study. 3. Bony erosive changes and remodeling of the sacrococcygeal region, may be due to pressure related changes or sequela of prior osteomyelitis. Electronically Signed   By: Donavan Foil M.D.   On: 03/31/2018 19:19   Dg Chest Port 1 View  Result Date: 03/27/2018 CLINICAL DATA:  Altered level of consciousness.  Fevers.  Sepsis. EXAM: PORTABLE CHEST 1 VIEW COMPARISON:  One-view chest x-ray 10/30/2017. FINDINGS: The heart is enlarged. Asymmetric left lower lobe airspace disease is present. No  definite effusions are present. The right lung is clear. Atherosclerotic calcifications are present at the aortic arch. The visualized soft tissues and bony thorax are unremarkable. IMPRESSION: 1. Asymmetric left lower lobe airspace disease is concerning for pneumonia. 2. Aortic atherosclerosis. Electronically Signed   By: San Morelle M.D.   On: 03/27/2018 20:34   US Abdomen Limited Ruq  Result Date: 03/28/2018 CLINICAL DATA:  Elevated liver function tests. Prior cholecystectomy. EXAM: ULTRASOUND ABDOMEN LIMITED RIGHT UPPER QUADRANT COMPARISON:  None. FINDINGS: Gallbladder: Surgically absent. Common bile duct: Diameter: 3 mm, within normal limits. Liver: 1.2 cm benign-appearing cyst in right hepatic lobe. No liver mass identified. Within normal limits in parenchymal echogenicity. Portal vein is patent on color  Doppler imaging with normal direction of blood flow towards the liver. IMPRESSION: Prior cholecystectomy.  No evidence of biliary ductal dilatation. Small benign right hepatic lobe cyst. Electronically Signed   By: Earle Gell M.D.   On: 03/28/2018 11:27    Microbiology: Recent Results (from the past 240 hour(s))  Blood Culture (routine x 2)     Status: None   Collection Time: 03/27/18  7:00 PM  Result Value Ref Range Status   Specimen Description BLOOD RIGHT WRIST  Final   Special Requests   Final    BOTTLES DRAWN AEROBIC AND ANAEROBIC Blood Culture results may not be optimal due to an inadequate volume of blood received in culture bottles   Culture  Setup Time   Final    GRAM NEGATIVE RODS AEROBIC BOTTLE ONLY CRITICAL RESULT CALLED TO, READ BACK BY AND VERIFIED WITH: A. MASTERS, PHARMD AT 1310 ON 03/28/18 BY C. JESSUP, MLT. CORRECTED RESULTS PREVIOUSLY REPORTED AS: GRAM POSITIVE COCCI IN PAIRS AND CHAINS ANAEROBIC BOTTLE ONLY CRITICAL RESULT CALLED TO, READ BACK BY AND VERIFIED WITH: A. MASTERS, PHARMD AT 1705 ON 03/28/18 BY C. JESSUP, MLT. Results Called to: A. MASTERS, PHARMD AT 0930 ON 03/29/18 BY C. JESSUP, MLT.    Culture   Final    GRAM NEGATIVE RODS CORRECTED RESULTS PREVIOUSLY REPORTED AS: GRAM POSITIVE COCCI DISREGARD RESULT. SPECIMEN IDENTITY CANNOT BE VERIFIED. TEST HAS BEEN CREDITED. 09 10 2019 BEAMJ Performed at [facility], [place] [address], [place], [place] [address]    Report Status 03/31/2018 FINAL  Final  Blood Culture ID Panel (Reflexed)     Status: Abnormal   Collection Time: 03/27/18  7:00 PM  Result Value Ref Range Status   Enterococcus species DETECTED (A) NOT DETECTED Final    Comment: CRITICAL RESULT CALLED TO, READ BACK BY AND VERIFIED WITH: A. MASTERS, PHARMD AT 1705 ON 03/28/18 BY C. JESSUP, MLT.    Vancomycin resistance NOT DETECTED NOT DETECTED Final   Listeria monocytogenes NOT DETECTED NOT DETECTED Final   Staphylococcus species NOT DETECTED NOT  DETECTED Final   Staphylococcus aureus NOT DETECTED NOT DETECTED Final   Streptococcus species NOT DETECTED NOT DETECTED Final   Streptococcus agalactiae NOT DETECTED NOT DETECTED Final   Streptococcus pneumoniae NOT DETECTED NOT DETECTED Final   Streptococcus pyogenes NOT DETECTED NOT DETECTED Final   Acinetobacter baumannii NOT DETECTED NOT DETECTED Final   Enterobacteriaceae species NOT DETECTED NOT DETECTED Final   Enterobacter cloacae complex NOT DETECTED NOT DETECTED Final   Escherichia coli NOT DETECTED NOT DETECTED Final   Klebsiella oxytoca NOT DETECTED NOT DETECTED Final   Klebsiella pneumoniae NOT DETECTED NOT DETECTED Final   Proteus species NOT DETECTED NOT DETECTED Final   Serratia marcescens NOT DETECTED NOT DETECTED Final   Haemophilus  influenzae NOT DETECTED NOT DETECTED Final   Neisseria meningitidis NOT DETECTED NOT DETECTED Final   Pseudomonas aeruginosa NOT DETECTED NOT DETECTED Final   Candida albicans NOT DETECTED NOT DETECTED Final   Candida glabrata NOT DETECTED NOT DETECTED Final   Candida krusei NOT DETECTED NOT DETECTED Final   Candida parapsilosis NOT DETECTED NOT DETECTED Final   Candida tropicalis NOT DETECTED NOT DETECTED Final    Comment: Performed at [facility], [place] [address], [place], [place] [address]  Blood Culture (routine x 2)     Status: Abnormal   Collection Time: 03/27/18  7:35 PM  Result Value Ref Range Status   Specimen Description SITE NOT SPECIFIED  Final   Special Requests   Final    BOTTLES DRAWN AEROBIC AND ANAEROBIC Blood Culture adequate volume   Culture  Setup Time   Final    GRAM NEGATIVE RODS IN BOTH AEROBIC AND ANAEROBIC BOTTLES CRITICAL RESULT CALLED TO, READ BACK BY AND VERIFIED WITH: A. MASTERS, PHARMD AT 1310 ON 03/28/18 BY C. JESSUP, MLT. Performed at [facility], [place] [address], [place], [place] [address]    Culture KLEBSIELLA OXYTOCA (A)  Final   Report Status 04/01/2018 FINAL  Final   Organism ID,  Bacteria KLEBSIELLA OXYTOCA  Final      Susceptibility   Klebsiella oxytoca - MIC*    AMPICILLIN >=32 RESISTANT Resistant     CEFAZOLIN 8 SENSITIVE Sensitive     CEFEPIME <=1 SENSITIVE Sensitive     CEFTAZIDIME <=1 SENSITIVE Sensitive     CEFTRIAXONE <=1 SENSITIVE Sensitive     CIPROFLOXACIN <=0.25 SENSITIVE Sensitive     GENTAMICIN <=1 SENSITIVE Sensitive     IMIPENEM <=0.25 SENSITIVE Sensitive     TRIMETH/SULFA <=20 SENSITIVE Sensitive     AMPICILLIN/SULBACTAM 8 SENSITIVE Sensitive     PIP/TAZO <=4 SENSITIVE Sensitive     Extended ESBL NEGATIVE Sensitive     * KLEBSIELLA OXYTOCA  Blood Culture ID Panel (Reflexed)     Status: Abnormal   Collection Time: 03/27/18  7:35 PM  Result Value Ref Range Status   Enterococcus species NOT DETECTED NOT DETECTED Final   Listeria monocytogenes NOT DETECTED NOT DETECTED Final   Staphylococcus species NOT DETECTED NOT DETECTED Final   Staphylococcus aureus NOT DETECTED NOT DETECTED Final   Streptococcus species NOT DETECTED NOT DETECTED Final   Streptococcus agalactiae NOT DETECTED NOT DETECTED Final   Streptococcus pneumoniae NOT DETECTED NOT DETECTED Final   Streptococcus pyogenes NOT DETECTED NOT DETECTED Final   Acinetobacter baumannii NOT DETECTED NOT DETECTED Final   Enterobacteriaceae species DETECTED (A) NOT DETECTED Final    Comment: Enterobacteriaceae represent a large family of gram-negative bacteria, not a single organism. CRITICAL RESULT CALLED TO, READ BACK BY AND VERIFIED WITH: A. MASTERS, PHARMD AT 1310 03/28/18 BY C. JESSUP, MLT.    Enterobacter cloacae complex NOT DETECTED NOT DETECTED Final   Escherichia coli NOT DETECTED NOT DETECTED Final   Klebsiella oxytoca DETECTED (A) NOT DETECTED Final    Comment: CRITICAL RESULT CALLED TO, READ BACK BY AND VERIFIED WITH: A. MASTERS, PHARMD AT 1310 03/28/18 BY C. JESSUP, MLT.    Klebsiella pneumoniae NOT DETECTED NOT DETECTED Final   Proteus species NOT DETECTED NOT DETECTED Final    Serratia marcescens NOT DETECTED NOT DETECTED Final   Carbapenem resistance NOT DETECTED NOT DETECTED Final   Haemophilus influenzae NOT DETECTED NOT DETECTED Final   Neisseria meningitidis NOT DETECTED NOT DETECTED Final   Pseudomonas  aeruginosa NOT DETECTED NOT DETECTED Final   Candida albicans NOT DETECTED NOT DETECTED Final   Candida glabrata NOT DETECTED NOT DETECTED Final   Candida krusei NOT DETECTED NOT DETECTED Final   Candida parapsilosis NOT DETECTED NOT DETECTED Final   Candida tropicalis NOT DETECTED NOT DETECTED Final    Comment: Performed at [facility], [place] [address], [place], [place] [address]  Urine culture     Status: Abnormal   Collection Time: 03/27/18  8:18 PM  Result Value Ref Range Status   Specimen Description URINE, CATHETERIZED  Final   Special Requests   Final    NONE Performed at [facility], [place] [address] [place], [place] [address]    Culture >=100,000 COLONIES/mL PSEUDOMONAS AERUGINOSA (A)  Final   Report Status 03/30/2018 FINAL  Final   Organism ID, Bacteria PSEUDOMONAS AERUGINOSA (A)  Final      Susceptibility   Pseudomonas aeruginosa - MIC*    CEFTAZIDIME 4 SENSITIVE Sensitive     CIPROFLOXACIN <=0.25 SENSITIVE Sensitive     GENTAMICIN <=1 SENSITIVE Sensitive     IMIPENEM 2 SENSITIVE Sensitive     PIP/TAZO <=4 SENSITIVE Sensitive     CEFEPIME 2 SENSITIVE Sensitive     * >=100,000 COLONIES/mL PSEUDOMONAS AERUGINOSA  Culture, blood (Routine X 2) w Reflex to ID Panel     Status: None (Preliminary result)   Collection Time: 03/30/18 12:08 PM  Result Value Ref Range Status   Specimen Description BLOOD LEFT HAND  Final   Special Requests   Final    BOTTLES DRAWN AEROBIC ONLY Blood Culture adequate volume   Culture   Final    NO GROWTH 3 DAYS Performed at [facility], [address], [place], [place] [address]    Report Status PENDING  Incomplete  Culture, blood (Routine X 2) w Reflex to ID Panel     Status: None  (Preliminary result)   Collection Time: 03/30/18 12:11 PM  Result Value Ref Range Status   Specimen Description BLOOD SITE NOT SPECIFIED  Final   Special Requests   Final    BOTTLES DRAWN AEROBIC ONLY Blood Culture adequate volume   Culture   Final    NO GROWTH 3 DAYS Performed at [facility], [address] [place], [place] [address]    Report Status PENDING  Incomplete     Labs: Basic Metabolic Panel: Recent Labs  Lab 03/27/18 1923 03/28/18 0434 03/29/18 0426 03/30/18 0941 03/31/18 0832  NA 137 137 138 137 139  K 3.7 3.7 3.8 3.7 3.5  CL 106 107 107 109 109  CO2 18* 21* 22 18* 23  GLUCOSE 232* 137* 139* 281* 117*  BUN 8 10 12 10 10   CREATININE 0.71 0.71 0.73 0.66 0.61  CALCIUM 8.8* 8.3* 8.2* 8.2* 8.6*   Liver Function Tests: Recent Labs  Lab 03/27/18 1923 03/28/18 0434 03/29/18 0426 03/30/18 0941 03/31/18 0832  AST 276* 156* 84* 59* 48*  ALT 259* 196* 143* 99* 87*  ALKPHOS 340* 259* 219* 197* 187*  BILITOT 3.7* 4.7* 2.8* 1.1 1.0  PROT 7.2 6.3* 6.3* 5.8* 6.1*  ALBUMIN 3.3* 2.7* 2.6* 2.4* 2.5*   CBC: Recent Labs  Lab 03/27/18 1923  03/29/18 0426 03/30/18 0941 03/31/18 0832 04/01/18 0325 04/02/18 0754  WBC 12.3*   < > 7.0 4.6 4.7 5.1 7.9  NEUTROABS 11.0*  --   --   --   --   --   --  HGB 13.9   < > 11.7* 11.2* 11.1* 11.3* 11.9*  HCT 43.1   < > 35.3* 34.6* 33.3* 33.8* 36.7*  MCV 80.9   < > 78.8 81.6 79.1 78.8 79.8  PLT 190   < > 161 143* 188 206 259   < > = values in this interval not displayed.   Cardiac Enzymes: Recent Labs  Lab 03/28/18 0434 03/28/18 0732 03/28/18 1512  CKTOTAL 26*  --   --   CKMB 0.7  --   --   TROPONINI 0.05* 0.04* 0.03*    CBG: Recent Labs  Lab 04/01/18 2107 04/02/18 0616 04/02/18 1111  GLUCAP 161* 120* 140*    Signed:  Barton Dubois MD.  [facility] 04/02/2018, 12:56 PM

## 2018-04-03 ENCOUNTER — Telehealth: Payer: Self-pay

## 2018-04-03 NOTE — Telephone Encounter (Signed)
Pt is on tcm list. Pt dc'ed on 04/02/2018 after admission for sepsis. I called all number listed for pt and none were working numbers.

## 2018-04-04 LAB — CULTURE, BLOOD (ROUTINE X 2)
CULTURE: NO GROWTH
CULTURE: NO GROWTH
SPECIAL REQUESTS: ADEQUATE
Special Requests: ADEQUATE

## 2018-04-06 NOTE — Telephone Encounter (Signed)
Called spouse and informed of same.

## 2018-04-06 NOTE — Telephone Encounter (Signed)
LVM with pt spouse to call us back. Pt was sleeping when I called. Spouse stated that he has been feeling better. Spouse wanted to know if they needed to schedule a hospital follow up since they have home physicians come out every month. Please advise.

## 2018-04-06 NOTE — Telephone Encounter (Signed)
At this point his care if coming from the physcians that come to the home, so no hosp follow up is needed

## 2018-04-11 DIAGNOSIS — I509 Heart failure, unspecified: Secondary | ICD-10-CM | POA: Diagnosis not present

## 2018-04-11 DIAGNOSIS — G35 Multiple sclerosis: Secondary | ICD-10-CM | POA: Diagnosis not present

## 2018-04-11 DIAGNOSIS — G894 Chronic pain syndrome: Secondary | ICD-10-CM | POA: Diagnosis not present

## 2018-04-11 DIAGNOSIS — J189 Pneumonia, unspecified organism: Secondary | ICD-10-CM | POA: Diagnosis not present

## 2018-04-11 DIAGNOSIS — E1165 Type 2 diabetes mellitus with hyperglycemia: Secondary | ICD-10-CM | POA: Diagnosis not present

## 2018-04-13 ENCOUNTER — Other Ambulatory Visit: Payer: Self-pay | Admitting: Internal Medicine

## 2018-05-21 DIAGNOSIS — I10 Essential (primary) hypertension: Secondary | ICD-10-CM | POA: Diagnosis not present

## 2018-05-21 DIAGNOSIS — I509 Heart failure, unspecified: Secondary | ICD-10-CM | POA: Diagnosis not present

## 2018-05-21 DIAGNOSIS — E1165 Type 2 diabetes mellitus with hyperglycemia: Secondary | ICD-10-CM | POA: Diagnosis not present

## 2018-05-21 DIAGNOSIS — G35 Multiple sclerosis: Secondary | ICD-10-CM | POA: Diagnosis not present

## 2018-05-21 DIAGNOSIS — G894 Chronic pain syndrome: Secondary | ICD-10-CM | POA: Diagnosis not present

## 2018-05-21 DIAGNOSIS — I5022 Chronic systolic (congestive) heart failure: Secondary | ICD-10-CM | POA: Diagnosis not present

## 2018-05-21 DIAGNOSIS — Z7902 Long term (current) use of antithrombotics/antiplatelets: Secondary | ICD-10-CM | POA: Diagnosis not present

## 2018-05-21 DIAGNOSIS — Z89619 Acquired absence of unspecified leg above knee: Secondary | ICD-10-CM | POA: Diagnosis not present

## 2018-05-28 ENCOUNTER — Ambulatory Visit: Payer: Medicare Other | Admitting: Internal Medicine

## 2018-06-27 DIAGNOSIS — K219 Gastro-esophageal reflux disease without esophagitis: Secondary | ICD-10-CM | POA: Diagnosis not present

## 2018-06-27 DIAGNOSIS — E1165 Type 2 diabetes mellitus with hyperglycemia: Secondary | ICD-10-CM | POA: Diagnosis not present

## 2018-06-27 DIAGNOSIS — Z7902 Long term (current) use of antithrombotics/antiplatelets: Secondary | ICD-10-CM | POA: Diagnosis not present

## 2018-06-27 DIAGNOSIS — G894 Chronic pain syndrome: Secondary | ICD-10-CM | POA: Diagnosis not present

## 2018-06-27 DIAGNOSIS — J302 Other seasonal allergic rhinitis: Secondary | ICD-10-CM | POA: Diagnosis not present

## 2018-06-27 DIAGNOSIS — I509 Heart failure, unspecified: Secondary | ICD-10-CM | POA: Diagnosis not present

## 2018-07-09 ENCOUNTER — Telehealth: Payer: Self-pay | Admitting: Neurology

## 2018-07-09 NOTE — Telephone Encounter (Signed)
Patient's wife called regarding his Baclofen 10 MG medication. He is needing the prescription sent to [facility] at [place]. Thanks

## 2018-07-10 DIAGNOSIS — N390 Urinary tract infection, site not specified: Secondary | ICD-10-CM | POA: Diagnosis not present

## 2018-07-10 MED ORDER — BACLOFEN 10 MG PO TABS: 5.0000 mg | ORAL_TABLET | Freq: Two times a day (BID) | ORAL | 5 refills | Status: DC

## 2018-08-06 ENCOUNTER — Telehealth: Payer: Self-pay

## 2018-08-06 NOTE — Telephone Encounter (Signed)
Phone call placed to patient to check in and offer to schedule a follow up visit. VM left with contact information.

## 2018-08-08 DIAGNOSIS — I10 Essential (primary) hypertension: Secondary | ICD-10-CM | POA: Diagnosis not present

## 2018-08-08 DIAGNOSIS — I509 Heart failure, unspecified: Secondary | ICD-10-CM | POA: Diagnosis not present

## 2018-08-08 DIAGNOSIS — G35 Multiple sclerosis: Secondary | ICD-10-CM | POA: Diagnosis not present

## 2018-08-08 DIAGNOSIS — J302 Other seasonal allergic rhinitis: Secondary | ICD-10-CM | POA: Diagnosis not present

## 2018-08-08 DIAGNOSIS — J189 Pneumonia, unspecified organism: Secondary | ICD-10-CM | POA: Diagnosis not present

## 2018-08-26 NOTE — Progress Notes (Deleted)
Cardiology Office Note    Date:  08/26/2018   ID:  Jaime [name], DOB [birth year], MRN [id number]  PCP:  [name], MD  Cardiologist:  Dr Ron Parker -->  [name], MD   No chief complaint on file.  History of Present Illness:  Jaime [name] is a 78 y.o. male with h/o MS, s/p B/L AKA, wheelchair bound, incontinence, CAD, s/p PCI to LCX in 2007, with h/o ischemic CMP, the last echo in April 2019 showed LVEF 30 to 35%, akinesis of the basal and mid   inferior, inferolateral and apical inferior walls, grade 1 diastolic dysfunction mild pulmonary hypertension, no significant valvular abnormalities.  01/23/2018, the patient was hospitalized in April 2019 with Klebsiella UTI complicated by sepsis.  He has recovered from that.  He has developed significant sacral ulcer and pain and he spends most of his days in bed.  He has chronic cough and difficulty to cough up phlegm however no fever or chills.  It has been becoming more and more difficult for his wife to take care of him and they are considering placement.  He otherwise denies chest pain shortness of breath at rest no orthopnea or proximal nocturnal dyspnea.  08/28/2018 -this is 6 months follow-up, the patient states that   Past Medical History:  Diagnosis Date  . Atherosclerotic PVD with ulceration ([place])    left foot  . Bacteremia   . CAD (coronary artery disease) CARDIOLOGIST- DR KATZ-- VISIT 06-05-2011 IN EPIC   non-STEMI, 2007.Marland Kitchenoccluded circumflex.. Taxus stent placed...residual 80% LAD...50% RCA  . Cardiomyopathy, ischemic    EF 45% per ECHO 2008  //   EF 25%, echo, August, 2013 //  Echo (8/15):  Mild LVH, EF 30-35%, ant-lat and lat AK, inf HK, Gr 1 DD, mild MR, mild LAE  . Carotid artery disease ([place])    a.  Doppler, February, 2012, 0-39% bilateral,Mild smooth plaque;  b.  Carotid US (8/15):  Bilateral 1-39% ICA >>> F/u 2 years  . Chronic indwelling Foley catheter   . Constipation   . Dementia   . Diabetes mellitus    INSULIN-DEPENDent  . Fatigue SEVERE  . H/O pleural effusion 2008   POST THORACENTESIS  . History of colon polyps PRECANCEROUS  . Hyperlipidemia   . Hypertension   . Impotence   . Increased prostate specific antigen (PSA) velocity   . Insomnia   . Lung nodule    resolved 11-2006 CT Chest  . Multiple sclerosis ([place]) [place] -- LAST VISIT 11-20-2010  NOTE W/ CHART  . PAC (premature atrial contraction)    December, 2013  . Pulmonary hypertension ([place])    moderate ECHO Jan 2008  . Scoliosis associated with other condition   . Sleep apnea   . Systolic heart failure   . TIA (transient ischemic attack)   . Tobacco abuse    quit   . Urinary retention    dx ~ 2-12, like from [place], now with a catheter, saw urology  . Urinary tract infection    hx of    Past Surgical History:  Procedure Laterality Date  . ABDOMINAL ANGIOGRAM  12/17/2011   Procedure: ABDOMINAL ANGIOGRAM;  Surgeon: Serafina Mitchell, MD;  Location: [facility] CATH LAB;  Service: Cardiovascular;;  . ABDOMINAL AORTAGRAM N/A 08/19/2013   Procedure: ABDOMINAL Maxcine Ham;  Surgeon: Conrad Arecibo, MD;  Location: [facility] CATH LAB;  Service: Cardiovascular;  Laterality: N/A;  . AMPUTATION  03/17/2012  Procedure: AMPUTATION ABOVE KNEE;  Surgeon: Conrad M[place]burg, MD;  Location: [place];  Service: Vascular;  Laterality: Left;  . AMPUTATION  06/03/2012   Procedure: AMPUTATION ABOVE KNEE;  Surgeon: Conrad Ajo, MD;  Location: [place];  Service: Vascular;  Laterality: Right;  . CHOLECYSTECTOMY N/A 10/25/2014   Procedure: LAPAROSCOPIC CHOLECYSTECTOMY ;  Surgeon: Coralie Keens, MD;  Location: [place];  Service: General;  Laterality: N/A;  . CORONARY ANGIOPLASTY WITH STENT PLACEMENT  05-01-2006   OCCLUDED CIRCUMFLEX -- TAXUS STENT PLACMENT  AND RESIDUAL 80% LAD,  50% RCA  . CYSTOSCOPY  07/30/2011   Procedure: CYSTOSCOPY;  Surgeon: Hanley Ben, MD;  Location: [facility];  Service: Urology;  Laterality: N/A;  . ERCP N/A  08/25/2014   Procedure: ENDOSCOPIC RETROGRADE CHOLANGIOPANCREATOGRAPHY (ERCP);  Surgeon: Ladene Artist, MD;  Location: [facility] ENDOSCOPY;  Service: Endoscopy;  Laterality: N/A;  . FEMORAL-POPLITEAL BYPASS GRAFT  03/11/2012   Procedure: BYPASS GRAFT FEMORAL-POPLITEAL ARTERY;  Surgeon: Conrad [place], MD;  Location: [place];  Service: Vascular;  Laterality: Left;  embolectomy left lower leg  . FEMORAL-TIBIAL BYPASS GRAFT  03/11/2012   Procedure: BYPASS GRAFT FEMORAL-TIBIAL ARTERY;  Surgeon: Conrad [place], MD;  Location: [facility] OR;  Service: Vascular;  Laterality: Left;  Left Femoral -Tibial trunk bypass, Endarterectomy of Tibial- Peroneal trunk with vein angioplasty.  Marland Kitchen HERNIA REPAIR  1990   (R)  . INTRAOPERATIVE ARTERIOGRAM  03/11/2012   Procedure: INTRA OPERATIVE ARTERIOGRAM;  Surgeon: Conrad Magna, MD;  Location: [place];  Service: Vascular;  Laterality: Left;  . LOWER EXTREMITY ANGIOGRAM Bilateral 12/17/2011   Procedure: LOWER EXTREMITY ANGIOGRAM;  Surgeon: Serafina Mitchell, MD;  Location: [facility] CATH LAB;  Service: Cardiovascular;  Laterality: Bilateral;  bil lower extrem angio  . THORACENTESIS  2008   PLEURAL EFFUSION  . TRANSURETHRAL RESECTION OF PROSTATE  07/30/2011   Procedure: TRANSURETHRAL RESECTION OF THE PROSTATE (TURP);  Surgeon: Hanley Ben, MD;  Location: [facility];  Service: Urology;  Laterality: N/A;    Current Medications: Outpatient Medications Prior to Visit  Medication Sig Dispense Refill  . albuterol (PROVENTIL HFA;VENTOLIN HFA) 108 (90 Base) MCG/ACT inhaler Inhale 2 puffs into the lungs every 6 (six) hours as needed for wheezing or shortness of breath. 1 Inhaler 2  . aspirin EC 81 MG tablet Take 1 tablet (81 mg total) by mouth daily. 90 tablet 3  . azelastine (ASTELIN) 0.1 % nasal spray Place 2 sprays into both nostrils at bedtime as needed for rhinitis. Use in each nostril as directed (Patient not taking: Reported on 03/27/2018) 30 mL 3  . baclofen (LIORESAL) 10 MG tablet Take  0.5 tablets (5 mg total) by mouth 2 (two) times daily. 30 each 5  . Cholecalciferol (VITAMIN D-3) 1000 units CAPS Take 2,000 Units by mouth daily.    . diclofenac sodium (VOLTAREN) 1 % GEL Apply 1 application topically 2 (two) times daily as needed (FOR PAIN).     . furosemide (LASIX) 20 MG tablet Take 1 tablet (20 mg total) by mouth daily as needed for fluid or edema (or shortness of breath). 30 tablet 0  . glucagon (GLUCAGEN) 1 MG SOLR injection Inject 1 mg into the muscle once as needed for low blood sugar. Reported on 11/16/2015    . guaiFENesin (ROBITUSSIN) 100 MG/5ML SOLN Take 5 mLs by mouth every 4 (four) hours as needed for cough or to loosen phlegm.    . insulin aspart protamine - aspart (NOVOLOG 70/30  MIX) (70-30) 100 UNIT/ML FlexPen Inject 0.05 mLs (5 Units total) into the skin 2 (two) times daily. (Patient taking differently: Inject 5 Units into the skin 2 (two) times daily as needed (if BGL is >100). ) 15 mL 5  . lisinopril (PRINIVIL,ZESTRIL) 10 MG tablet TAKE 1 TABLET BY MOUTH EVERY DAY 30 tablet 5  . Multiple Vitamins-Minerals (MULTIVITAMIN WITH MINERALS) tablet Take 1 tablet by mouth daily.    . nicotine (NICODERM CQ - DOSED IN MG/24 HOURS) 14 mg/24hr patch Place 1 patch (14 mg total) onto the skin daily. (Patient taking differently: Place 14 mg onto the skin daily as needed (ONLY IF SMOKING). ) 28 patch 0  . pantoprazole (PROTONIX) 40 MG tablet Take 1 tablet (40 mg total) by mouth daily with lunch. 90 tablet 1  . pregabalin (LYRICA) 150 MG capsule Take 150-300 mg by mouth See admin instructions. Take 150 mg by mouth in the morning and 300 mg at bedtime    . Propylene Glycol (SYSTANE BALANCE OP) Place 1-2 drops into both eyes daily.    . rosuvastatin (CRESTOR) 20 MG tablet TAKE 1 TABLET(20 MG) BY MOUTH DAILY 90 tablet 1  . saccharomyces boulardii (FLORASTOR) 250 MG capsule Take 1 capsule (250 mg total) by mouth 2 (two) times daily. 20 capsule 0  . traMADol (ULTRAM) 50 MG tablet Take 1  tablet (50 mg total) by mouth every 4 (four) hours as needed. (Patient taking differently: Take 50 mg by mouth every 4 (four) hours as needed (for pain). ) 120 tablet 5   No facility-administered medications prior to visit.     Allergies:   Bee venom; Beta adrenergic blockers; Carvedilol; Influenza vaccines; Atorvastatin; and Latex   Social History   Socioeconomic History  . Marital status: Married    Spouse name: Not on file  . Number of children: 2  . Years of education: Not on file  . Highest education level: Not on file  Occupational History  . Occupation: disable    Employer: RETIRED  Social Needs  . Financial resource strain: Not on file  . Food insecurity:    Worry: Not on file    Inability: Not on file  . Transportation needs:    Medical: Not on file    Non-medical: Not on file  Tobacco Use  . Smoking status: Former Smoker    Years: 50.00    Last attempt to quit: 07/22/2012    Years since quitting: 6.0  . Smokeless tobacco: Current User  . Tobacco comment: pt states that he is using E-cigs  Substance and Sexual Activity  . Alcohol use: No    Alcohol/week: 0.0 standard drinks  . Drug use: No  . Sexual activity: Never    Comment: electronic cigarettes no nicotene  Lifestyle  . Physical activity:    Days per week: Not on file    Minutes per session: Not on file  . Stress: Not on file  Relationships  . Social connections:    Talks on phone: Not on file    Gets together: Not on file    Attends religious service: Not on file    Active member of club or organization: Not on file    Attends meetings of clubs or organizations: Not on file    Relationship status: Not on file  Other Topics Concern  . Not on file  Social History Narrative   Lives w/ wife    Family History:  The patient's family history includes COPD in his  father; Diabetes in his brother and daughter; Heart attack in his brother; Heart attack (age of onset: 22) in his mother; Heart disease in his  brother and mother; Hyperlipidemia in his mother; Hypertension in his brother and mother; Peripheral vascular disease in his father; Stroke in his mother.   ROS:   Please see the history of present illness.    ROS All other systems reviewed and are negative.  PHYSICAL EXAM:   VS:  There were no vitals taken for this visit.   GEN: in no acute distress, in an electric wheelchair, with difficulties to move HEENT: normal  Neck: no JVD, carotid bruits, or masses Cardiac: RRR; no murmurs, rubs, or gallops,no edema, AKA ampuation B/L Respiratory:  clear to auscultation bilaterally, normal work of breathing, no wheezing. GI: soft, nontender, nondistended, + BS Skin: warm and dry, no rash Neuro:  Alert and Oriented x 3, poor strength in his arms and upper body.  Wt Readings from Last 3 Encounters:  03/31/18 147 lb 14.9 oz (67.1 kg)  01/23/18 127 lb (57.6 kg)  01/19/18 140 lb (63.5 kg)    Studies/Labs Reviewed:   EKG:  EKG is not ordered today.   Recent Labs: 10/30/2017: Magnesium 1.9 11/25/2017: TSH 0.39 03/31/2018: ALT 87; BUN 10; Creatinine, Ser 0.61; Potassium 3.5; Sodium 139 04/02/2018: Hemoglobin 11.9; Platelets 259   Lipid Panel    Component Value Date/Time   CHOL 110 11/25/2017 1113   TRIG 68.0 11/25/2017 1113   HDL 32.60 (L) 11/25/2017 1113   CHOLHDL 3 11/25/2017 1113   VLDL 13.6 11/25/2017 1113   LDLCALC 64 11/25/2017 1113   LDLDIRECT 123.5 03/17/2008 1049   Additional studies/ records that were reviewed today include:   08/14/2015 Left ventricle: The cavity size was normal. Systolic function was  moderately reduced. The estimated ejection fraction was 35%.  Diffuse hypokinesis worse in the inferior and inferolateral  regions. Doppler parameters are consistent with abnormal left  ventricular relaxation (grade 1 diastolic dysfunction). - Mitral valve: There was mild regurgitation. - Right ventricle: The cavity size was normal. Wall thickness was  normal. Systolic  function was mildly reduced. - Tricuspid valve: There was mild regurgitation. - Inferior vena cava: The vessel was normal in size. The  respirophasic diameter changes were in the normal range (>= 50%),  consistent with normal central venous pressure.    ASSESSMENT:    No diagnosis found. PLAN:  In order of problems listed above:  1. CAD - asymptomatic, No EKG today, however patient asymptomatic, will continue medical management for CAD, no ischemic workup is needed right now.  Continue aspirin 81 mg daily.  2. Ischemic CMP, lVEF stable at 35%, no palpitations or syncope,  no BB as has recurrent bradycardia, continue lisinopril.  3. Chronic systolic CHF - no evidence of fluid overload. Continue Lasix 20 mg daily.  His labs were normal including electrolytes and creatinine in May 2019  4. Hypertension - controlled, continue current regimen.  5.  Hyperlipidemia, he is tolerating rosuvastatin.  Medication Adjustments/Labs and Tests Ordered: Current medicines are reviewed at length with the patient today.  Concerns regarding medicines are outlined above.  Medication changes, Labs and Tests ordered today are listed in the Patient Instructions below. There are no Patient Instructions on file for this visit.   Signed, [name], MD  08/26/2018 4:49 PM    [facility] [address], [place], [place]  [address] Phone: [phone]; Fax: [phone]

## 2018-08-28 ENCOUNTER — Ambulatory Visit: Payer: Medicare Other | Admitting: Cardiology

## 2018-10-03 DIAGNOSIS — I5022 Chronic systolic (congestive) heart failure: Secondary | ICD-10-CM | POA: Diagnosis not present

## 2018-10-03 DIAGNOSIS — E1165 Type 2 diabetes mellitus with hyperglycemia: Secondary | ICD-10-CM | POA: Diagnosis not present

## 2018-10-03 DIAGNOSIS — Z7902 Long term (current) use of antithrombotics/antiplatelets: Secondary | ICD-10-CM | POA: Diagnosis not present

## 2018-10-03 DIAGNOSIS — G894 Chronic pain syndrome: Secondary | ICD-10-CM | POA: Diagnosis not present

## 2018-10-03 DIAGNOSIS — I10 Essential (primary) hypertension: Secondary | ICD-10-CM | POA: Diagnosis not present

## 2018-10-08 ENCOUNTER — Ambulatory Visit: Payer: Medicare Other | Admitting: Physician Assistant

## 2018-10-29 ENCOUNTER — Other Ambulatory Visit: Payer: Self-pay | Admitting: *Deleted

## 2018-10-29 NOTE — Patient Outreach (Signed)
[facility]) Care Management  10/29/2018  Jaime [name] 02-24-1941 [id number]   Screening for Tier 5 level of care Per Epic chart review, patient is active with [facility] Palliative care provider.  No [facility] care management needs identified at this time. Royetta Crochet. Laymond Purser, MSN, RN, [employer] , [place] [phone]) Toll Free Office

## 2018-11-05 ENCOUNTER — Telehealth: Payer: Self-pay | Admitting: Neurology

## 2018-11-05 ENCOUNTER — Telehealth: Payer: Self-pay | Admitting: *Deleted

## 2018-11-05 NOTE — Telephone Encounter (Signed)
Caryl Pina took the call.

## 2018-11-05 NOTE — Telephone Encounter (Signed)
Patient's wife called stating that he feels like there is something in his throat and his pulse is around 44.  She said that she thinks he may be dehydrated so she is giving him pedialyte.  She called his PCP and they advised to take him to ED but she would rather just keep an eye on him for now.  Instructed her to call 911 if he does take a turn for the worse.

## 2018-11-09 DIAGNOSIS — R5383 Other fatigue: Secondary | ICD-10-CM | POA: Diagnosis not present

## 2018-11-09 DIAGNOSIS — R001 Bradycardia, unspecified: Secondary | ICD-10-CM | POA: Diagnosis not present

## 2018-11-09 DIAGNOSIS — E860 Dehydration: Secondary | ICD-10-CM | POA: Diagnosis not present

## 2018-11-09 DIAGNOSIS — F5104 Psychophysiologic insomnia: Secondary | ICD-10-CM | POA: Diagnosis not present

## 2018-11-12 ENCOUNTER — Other Ambulatory Visit: Payer: Self-pay

## 2018-11-12 ENCOUNTER — Telehealth: Payer: Self-pay | Admitting: Cardiology

## 2018-11-12 ENCOUNTER — Ambulatory Visit (HOSPITAL_COMMUNITY)
Admission: EM | Disposition: A | Discharge status: Home / Self Care | Payer: Self-pay | Source: Home / Self Care | Attending: Emergency Medicine

## 2018-11-12 ENCOUNTER — Encounter (HOSPITAL_COMMUNITY): Payer: Self-pay | Admitting: Physician Assistant

## 2018-11-12 ENCOUNTER — Emergency Department (HOSPITAL_COMMUNITY): Payer: Medicare Other

## 2018-11-12 ENCOUNTER — Observation Stay (HOSPITAL_COMMUNITY)
Admission: EM | Admit: 2018-11-12 | Discharge: 2018-11-13 | Disposition: A | Discharge status: Home / Self Care | Payer: Medicare Other | Attending: Internal Medicine | Admitting: Internal Medicine

## 2018-11-12 DIAGNOSIS — G35 Multiple sclerosis: Secondary | ICD-10-CM | POA: Diagnosis not present

## 2018-11-12 DIAGNOSIS — Z20828 Contact with and (suspected) exposure to other viral communicable diseases: Secondary | ICD-10-CM | POA: Insufficient documentation

## 2018-11-12 DIAGNOSIS — I2510 Atherosclerotic heart disease of native coronary artery without angina pectoris: Secondary | ICD-10-CM | POA: Insufficient documentation

## 2018-11-12 DIAGNOSIS — E785 Hyperlipidemia, unspecified: Secondary | ICD-10-CM | POA: Insufficient documentation

## 2018-11-12 DIAGNOSIS — Z8673 Personal history of transient ischemic attack (TIA), and cerebral infarction without residual deficits: Secondary | ICD-10-CM | POA: Diagnosis not present

## 2018-11-12 DIAGNOSIS — Z955 Presence of coronary angioplasty implant and graft: Secondary | ICD-10-CM | POA: Insufficient documentation

## 2018-11-12 DIAGNOSIS — G4730 Sleep apnea, unspecified: Secondary | ICD-10-CM | POA: Insufficient documentation

## 2018-11-12 DIAGNOSIS — Z7982 Long term (current) use of aspirin: Secondary | ICD-10-CM | POA: Diagnosis not present

## 2018-11-12 DIAGNOSIS — I252 Old myocardial infarction: Secondary | ICD-10-CM | POA: Insufficient documentation

## 2018-11-12 DIAGNOSIS — F0390 Unspecified dementia without behavioral disturbance: Secondary | ICD-10-CM | POA: Diagnosis not present

## 2018-11-12 DIAGNOSIS — R0602 Shortness of breath: Secondary | ICD-10-CM | POA: Diagnosis not present

## 2018-11-12 DIAGNOSIS — G4700 Insomnia, unspecified: Secondary | ICD-10-CM | POA: Diagnosis not present

## 2018-11-12 DIAGNOSIS — R001 Bradycardia, unspecified: Secondary | ICD-10-CM | POA: Insufficient documentation

## 2018-11-12 DIAGNOSIS — Z8249 Family history of ischemic heart disease and other diseases of the circulatory system: Secondary | ICD-10-CM | POA: Diagnosis not present

## 2018-11-12 DIAGNOSIS — Z950 Presence of cardiac pacemaker: Secondary | ICD-10-CM | POA: Diagnosis not present

## 2018-11-12 DIAGNOSIS — K219 Gastro-esophageal reflux disease without esophagitis: Secondary | ICD-10-CM | POA: Insufficient documentation

## 2018-11-12 DIAGNOSIS — E1151 Type 2 diabetes mellitus with diabetic peripheral angiopathy without gangrene: Secondary | ICD-10-CM | POA: Diagnosis not present

## 2018-11-12 DIAGNOSIS — I110 Hypertensive heart disease with heart failure: Secondary | ICD-10-CM | POA: Insufficient documentation

## 2018-11-12 DIAGNOSIS — I5022 Chronic systolic (congestive) heart failure: Secondary | ICD-10-CM | POA: Diagnosis not present

## 2018-11-12 DIAGNOSIS — R531 Weakness: Secondary | ICD-10-CM | POA: Diagnosis not present

## 2018-11-12 DIAGNOSIS — Z794 Long term (current) use of insulin: Secondary | ICD-10-CM | POA: Insufficient documentation

## 2018-11-12 DIAGNOSIS — Z89612 Acquired absence of left leg above knee: Secondary | ICD-10-CM | POA: Diagnosis not present

## 2018-11-12 DIAGNOSIS — Z89611 Acquired absence of right leg above knee: Secondary | ICD-10-CM | POA: Diagnosis not present

## 2018-11-12 DIAGNOSIS — I442 Atrioventricular block, complete: Secondary | ICD-10-CM | POA: Diagnosis not present

## 2018-11-12 DIAGNOSIS — I255 Ischemic cardiomyopathy: Secondary | ICD-10-CM | POA: Insufficient documentation

## 2018-11-12 DIAGNOSIS — Z79899 Other long term (current) drug therapy: Secondary | ICD-10-CM | POA: Diagnosis not present

## 2018-11-12 DIAGNOSIS — Z87891 Personal history of nicotine dependence: Secondary | ICD-10-CM | POA: Insufficient documentation

## 2018-11-12 DIAGNOSIS — I441 Atrioventricular block, second degree: Secondary | ICD-10-CM | POA: Diagnosis present

## 2018-11-12 DIAGNOSIS — I959 Hypotension, unspecified: Secondary | ICD-10-CM | POA: Diagnosis not present

## 2018-11-12 DIAGNOSIS — R079 Chest pain, unspecified: Secondary | ICD-10-CM | POA: Diagnosis not present

## 2018-11-12 HISTORY — DX: Atrioventricular block, second degree: I44.1

## 2018-11-12 HISTORY — PX: BIV PACEMAKER INSERTION CRT-P: EP1199

## 2018-11-12 HISTORY — DX: Bradycardia, unspecified: R00.1

## 2018-11-12 LAB — CBC WITH DIFFERENTIAL/PLATELET
Abs Immature Granulocytes: 0.01 10*3/uL (ref 0.00–0.07)
Basophils Absolute: 0 10*3/uL (ref 0.0–0.1)
Basophils Relative: 0 %
Eosinophils Absolute: 0.2 10*3/uL (ref 0.0–0.5)
Eosinophils Relative: 4 %
HCT: 39.9 % (ref 39.0–52.0)
Hemoglobin: 12.7 g/dL — ABNORMAL LOW (ref 13.0–17.0)
Immature Granulocytes: 0 %
Lymphocytes Relative: 37 %
Lymphs Abs: 1.9 10*3/uL (ref 0.7–4.0)
MCH: 26.1 pg (ref 26.0–34.0)
MCHC: 31.8 g/dL (ref 30.0–36.0)
MCV: 81.9 fL (ref 80.0–100.0)
Monocytes Absolute: 0.5 10*3/uL (ref 0.1–1.0)
Monocytes Relative: 10 %
Neutro Abs: 2.5 10*3/uL (ref 1.7–7.7)
Neutrophils Relative %: 49 %
Platelets: 128 10*3/uL — ABNORMAL LOW (ref 150–400)
RBC: 4.87 MIL/uL (ref 4.22–5.81)
RDW: 17.1 % — ABNORMAL HIGH (ref 11.5–15.5)
WBC: 5.2 10*3/uL (ref 4.0–10.5)
nRBC: 0 % (ref 0.0–0.2)

## 2018-11-12 LAB — URINALYSIS, ROUTINE W REFLEX MICROSCOPIC
Bacteria, UA: NONE SEEN
Bilirubin Urine: NEGATIVE
Glucose, UA: NEGATIVE mg/dL
Hgb urine dipstick: NEGATIVE
Ketones, ur: NEGATIVE mg/dL
Nitrite: NEGATIVE
Protein, ur: NEGATIVE mg/dL
Specific Gravity, Urine: 1.018 (ref 1.005–1.030)
pH: 5 (ref 5.0–8.0)

## 2018-11-12 LAB — GLUCOSE, CAPILLARY: Glucose-Capillary: 191 mg/dL — ABNORMAL HIGH (ref 70–99)

## 2018-11-12 LAB — COMPREHENSIVE METABOLIC PANEL
ALT: 11 U/L (ref 0–44)
AST: 15 U/L (ref 15–41)
Albumin: 3.2 g/dL — ABNORMAL LOW (ref 3.5–5.0)
Alkaline Phosphatase: 46 U/L (ref 38–126)
Anion gap: 10 (ref 5–15)
BUN: 24 mg/dL — ABNORMAL HIGH (ref 8–23)
CO2: 22 mmol/L (ref 22–32)
Calcium: 8.8 mg/dL — ABNORMAL LOW (ref 8.9–10.3)
Chloride: 107 mmol/L (ref 98–111)
Creatinine, Ser: 0.93 mg/dL (ref 0.61–1.24)
GFR calc Af Amer: >60 mL/min (ref >60)
GFR calc non Af Amer: >60 mL/min (ref >60)
Glucose, Bld: 139 mg/dL — ABNORMAL HIGH (ref 70–99)
Potassium: 4 mmol/L (ref 3.5–5.1)
Sodium: 139 mmol/L (ref 135–145)
Total Bilirubin: 0.6 mg/dL (ref 0.3–1.2)
Total Protein: 6.8 g/dL (ref 6.5–8.1)

## 2018-11-12 LAB — POCT I-STAT EG7
Acid-Base Excess: 1 mmol/L (ref 0.0–2.0)
Bicarbonate: 25.8 mmol/L (ref 20.0–28.0)
Calcium, Ion: 1.16 mmol/L (ref 1.15–1.40)
HCT: 39 % (ref 39.0–52.0)
Hemoglobin: 13.3 g/dL (ref 13.0–17.0)
O2 Saturation: 100 %
Potassium: 4 mmol/L (ref 3.5–5.1)
Sodium: 140 mmol/L (ref 135–145)
TCO2: 27 mmol/L (ref 22–32)
pCO2, Ven: 40 mmHg — ABNORMAL LOW (ref 44.0–60.0)
pH, Ven: 7.417 (ref 7.250–7.430)
pO2, Ven: 176 mmHg — ABNORMAL HIGH (ref 32.0–45.0)

## 2018-11-12 LAB — TROPONIN I: Troponin I: <0.03 ng/mL (ref <0.03)

## 2018-11-12 LAB — I-STAT CREATININE, ED: Creatinine, Ser: 0.8 mg/dL (ref 0.61–1.24)

## 2018-11-12 LAB — LACTIC ACID, PLASMA
Lactic Acid, Venous: 0.9 mmol/L (ref 0.5–1.9)
Lactic Acid, Venous: 1.9 mmol/L (ref 0.5–1.9)

## 2018-11-12 LAB — SARS CORONAVIRUS 2 BY RT PCR (HOSPITAL ORDER, PERFORMED IN ~~LOC~~ HOSPITAL LAB): SARS Coronavirus 2: NEGATIVE

## 2018-11-12 SURGERY — BIV PACEMAKER INSERTION CRT-P

## 2018-11-12 MED ORDER — ONDANSETRON HCL 4 MG/2ML IJ SOLN: 4.0000 mg | Freq: Four times a day (QID) | INTRAMUSCULAR | Status: DC | PRN

## 2018-11-12 MED ORDER — PANTOPRAZOLE SODIUM 40 MG PO TBEC
40.0000 mg | DELAYED_RELEASE_TABLET | Freq: Every day | ORAL | Status: DC
  Administered 2018-11-12: 18:00:00 40 mg via ORAL
  Filled 2018-11-12: qty 1

## 2018-11-12 MED ORDER — VITAMIN D 25 MCG (1000 UNIT) PO TABS
2000.0000 [IU] | ORAL_TABLET | Freq: Every day | ORAL | Status: DC
  Administered 2018-11-12 – 2018-11-13 (×2): 2000 [IU] via ORAL
  Filled 2018-11-12 (×2): qty 2

## 2018-11-12 MED ORDER — PREGABALIN 75 MG PO CAPS
300.0000 mg | ORAL_CAPSULE | Freq: Every day | ORAL | Status: DC
  Administered 2018-11-12: 300 mg via ORAL
  Filled 2018-11-12: qty 4

## 2018-11-12 MED ORDER — POLYVINYL ALCOHOL 1.4 % OP SOLN
1.0000 [drp] | Freq: Every day | OPHTHALMIC | Status: DC
  Administered 2018-11-12 – 2018-11-13 (×2): 1 [drp] via OPHTHALMIC
  Filled 2018-11-12: qty 15

## 2018-11-12 MED ORDER — FUROSEMIDE 20 MG PO TABS
20.0000 mg | ORAL_TABLET | Freq: Every day | ORAL | Status: DC
  Administered 2018-11-12 – 2018-11-13 (×2): 20 mg via ORAL
  Filled 2018-11-12 (×2): qty 1

## 2018-11-12 MED ORDER — MIDAZOLAM HCL 5 MG/5ML IJ SOLN
INTRAMUSCULAR | Status: AC
  Filled 2018-11-12: qty 5

## 2018-11-12 MED ORDER — GLUCAGON HCL (RDNA) 1 MG IJ SOLR: 1.0000 mg | Freq: Once | INTRAMUSCULAR | Status: DC | PRN

## 2018-11-12 MED ORDER — BACLOFEN 5 MG HALF TABLET
5.0000 mg | ORAL_TABLET | Freq: Two times a day (BID) | ORAL | Status: DC
  Administered 2018-11-12 – 2018-11-13 (×2): 5 mg via ORAL
  Filled 2018-11-12 (×2): qty 1

## 2018-11-12 MED ORDER — ACETAMINOPHEN 325 MG PO TABS: 325.0000 mg | ORAL_TABLET | ORAL | Status: DC | PRN

## 2018-11-12 MED ORDER — DICLOFENAC SODIUM 1 % TD GEL
1.0000 "application " | Freq: Two times a day (BID) | TRANSDERMAL | Status: DC | PRN
  Filled 2018-11-12: qty 100

## 2018-11-12 MED ORDER — ADULT MULTIVITAMIN W/MINERALS CH
1.0000 | ORAL_TABLET | Freq: Every day | ORAL | Status: DC
  Administered 2018-11-12 – 2018-11-13 (×2): 1 via ORAL
  Filled 2018-11-12 (×2): qty 1

## 2018-11-12 MED ORDER — HEPARIN (PORCINE) IN NACL 1000-0.9 UT/500ML-% IV SOLN
INTRAVENOUS | Status: DC | PRN
  Administered 2018-11-12: 500 mL

## 2018-11-12 MED ORDER — ROSUVASTATIN CALCIUM 20 MG PO TABS
20.0000 mg | ORAL_TABLET | Freq: Every day | ORAL | Status: DC
  Administered 2018-11-12 – 2018-11-13 (×2): 20 mg via ORAL
  Filled 2018-11-12 (×2): qty 1

## 2018-11-12 MED ORDER — SODIUM CHLORIDE 0.9 % IV SOLN
80.0000 mg | INTRAVENOUS | Status: AC
  Administered 2018-11-12: 15:00:00 80 mg
  Filled 2018-11-12: qty 2

## 2018-11-12 MED ORDER — MULTI-VITAMIN/MINERALS PO TABS: 1.0000 | ORAL_TABLET | Freq: Every day | ORAL | Status: DC

## 2018-11-12 MED ORDER — LORATADINE 10 MG PO TABS
10.0000 mg | ORAL_TABLET | Freq: Every day | ORAL | Status: DC
  Administered 2018-11-12 – 2018-11-13 (×2): 10 mg via ORAL
  Filled 2018-11-12 (×2): qty 1

## 2018-11-12 MED ORDER — ALBUTEROL SULFATE HFA 108 (90 BASE) MCG/ACT IN AERS
2.0000 | INHALATION_SPRAY | Freq: Four times a day (QID) | RESPIRATORY_TRACT | Status: DC | PRN
  Filled 2018-11-12: qty 6.7

## 2018-11-12 MED ORDER — LIDOCAINE HCL (PF) 1 % IJ SOLN
INTRAMUSCULAR | Status: DC | PRN
  Administered 2018-11-12: 50 mL

## 2018-11-12 MED ORDER — CEFAZOLIN SODIUM-DEXTROSE 2-4 GM/100ML-% IV SOLN
INTRAVENOUS | Status: AC
  Filled 2018-11-12: qty 100

## 2018-11-12 MED ORDER — FENTANYL CITRATE (PF) 100 MCG/2ML IJ SOLN
INTRAMUSCULAR | Status: AC
  Filled 2018-11-12: qty 2

## 2018-11-12 MED ORDER — CEFAZOLIN SODIUM-DEXTROSE 1-4 GM/50ML-% IV SOLN
1.0000 g | Freq: Four times a day (QID) | INTRAVENOUS | Status: AC
  Administered 2018-11-12 – 2018-11-13 (×3): 1 g via INTRAVENOUS
  Filled 2018-11-12 (×3): qty 50

## 2018-11-12 MED ORDER — LISINOPRIL 10 MG PO TABS
10.0000 mg | ORAL_TABLET | Freq: Every day | ORAL | Status: DC
  Administered 2018-11-12 – 2018-11-13 (×2): 10 mg via ORAL
  Filled 2018-11-12 (×2): qty 1

## 2018-11-12 MED ORDER — ASPIRIN EC 81 MG PO TBEC
81.0000 mg | DELAYED_RELEASE_TABLET | Freq: Every day | ORAL | Status: DC
  Administered 2018-11-12 – 2018-11-13 (×2): 81 mg via ORAL
  Filled 2018-11-12 (×2): qty 1

## 2018-11-12 MED ORDER — SODIUM CHLORIDE 0.9 % IV SOLN
INTRAVENOUS | Status: DC | PRN
  Administered 2018-11-12 – 2018-11-13 (×2): 250 mL via INTRAVENOUS

## 2018-11-12 MED ORDER — GUAIFENESIN 100 MG/5ML PO SOLN: 5.0000 mL | ORAL | Status: DC | PRN

## 2018-11-12 MED ORDER — LIDOCAINE HCL 1 % IJ SOLN
INTRAMUSCULAR | Status: AC
  Filled 2018-11-12: qty 60

## 2018-11-12 MED ORDER — SODIUM CHLORIDE 0.9 % IV BOLUS
1000.0000 mL | Freq: Once | INTRAVENOUS | Status: AC
  Administered 2018-11-12: 12:00:00 1000 mL via INTRAVENOUS

## 2018-11-12 MED ORDER — PREGABALIN 75 MG PO CAPS
150.0000 mg | ORAL_CAPSULE | Freq: Every morning | ORAL | Status: DC
  Administered 2018-11-13: 150 mg via ORAL
  Filled 2018-11-12: qty 2

## 2018-11-12 MED ORDER — IOHEXOL 350 MG/ML SOLN
INTRAVENOUS | Status: DC | PRN
  Administered 2018-11-12: 10 mL via INTRAVENOUS

## 2018-11-12 MED ORDER — SODIUM CHLORIDE 0.9 % IV SOLN
INTRAVENOUS | Status: AC
  Filled 2018-11-12: qty 2

## 2018-11-12 MED ORDER — SODIUM CHLORIDE 0.9 % IV SOLN
1.0000 g | Freq: Once | INTRAVENOUS | Status: AC
  Administered 2018-11-12: 11:00:00 1 g via INTRAVENOUS
  Filled 2018-11-12: qty 10

## 2018-11-12 MED ORDER — TRAMADOL HCL 50 MG PO TABS
50.0000 mg | ORAL_TABLET | ORAL | Status: DC | PRN
  Administered 2018-11-13: 50 mg via ORAL
  Filled 2018-11-12: qty 1

## 2018-11-12 MED ORDER — SODIUM CHLORIDE 0.9 % IV BOLUS
500.0000 mL | Freq: Once | INTRAVENOUS | Status: AC
  Administered 2018-11-12: 10:00:00 1000 mL via INTRAVENOUS

## 2018-11-12 MED ORDER — TRAZODONE HCL 100 MG PO TABS: 100.0000 mg | ORAL_TABLET | Freq: Every evening | ORAL | Status: DC | PRN

## 2018-11-12 MED ORDER — HEPARIN (PORCINE) IN NACL 1000-0.9 UT/500ML-% IV SOLN
INTRAVENOUS | Status: AC
  Filled 2018-11-12: qty 500

## 2018-11-12 MED ORDER — CEFAZOLIN SODIUM-DEXTROSE 2-4 GM/100ML-% IV SOLN
2.0000 g | INTRAVENOUS | Status: AC
  Administered 2018-11-12: 14:00:00 2 g via INTRAVENOUS
  Filled 2018-11-12: qty 100

## 2018-11-12 SURGICAL SUPPLY — 15 items
CABLE SURGICAL S-101-97-12 (CABLE) ×3 IMPLANT
CATH CPS DIRECT 135 DS2C020 (CATHETERS) ×3 IMPLANT
CATH HEX JOSEPH 2-5-2 65CM 6F (CATHETERS) ×3 IMPLANT
CPS IMPLANT KIT 410190 (MISCELLANEOUS) ×3 IMPLANT
LEAD QUARTET 1458QL-86 (Lead) ×1 IMPLANT
LEAD TENDRIL MRI 52CM LPA1200M (Lead) ×3 IMPLANT
LEAD TENDRIL MRI 58CM LPA1200M (Lead) ×3 IMPLANT
PACEMAKER QUDR ALLR CRT PM3562 (Pacemaker) ×1 IMPLANT
PAD PRO RADIOLUCENT 2001M-C (PAD) ×3 IMPLANT
PMKR QUADRA ALLURE CRT PM3562 (Pacemaker) ×3 IMPLANT
QUARTET 1458QL-86 (Lead) ×3 IMPLANT
SHEATH CLASSIC 8F (SHEATH) ×6 IMPLANT
SLITTER UNIVERSAL DS2A003 (MISCELLANEOUS) ×3 IMPLANT
TRAY PACEMAKER INSERTION (PACKS) ×3 IMPLANT
WIRE ACUITY WHISPER EDS 4648 (WIRE) ×3 IMPLANT

## 2018-11-12 NOTE — ED Notes (Signed)
Cardiology PA at bedside. 

## 2018-11-12 NOTE — ED Provider Notes (Signed)
[facility] EMERGENCY DEPARTMENT Provider Note   CSN: [id number] Arrival date & time: 11/12/18  6010    History   Chief Complaint No chief complaint on file.   HPI CARSIN Jaime [name] is a 78 y.o. male.     The history is provided by the patient, the EMS personnel and medical records. No language interpreter was used.   DARIS Jaime [name] is a 78 y.o. male who presents to the Emergency Department complaining of bradycardia, possible sepsis. He presents to the emergency department by EMS for concern for bradycardia and possible sepsis. His wife called 911 due to heart rate in the 40s and hypotension. He has a history of doing the same in the past when he was septic. He states that a week ago he had a rash on his back that resolve only to return last night. He denies any local pain or itching. He denies any fevers, chest pain, shortness of breath, nausea, vomiting, diarrhea, change in urine output. He has a condom cath that was changed yesterday. No recent medication changes. He took all his medications yesterday, not today. No known sick contacts or sick members in the household. EMS reports heart rate in the 40s and the blood pressure in the 80s on their arrival.  Additional hx available from patient's wife after patient's additional arrival.  Home vital signs today 102/60, HR 62.  For the last week he has been more confused and sleepy with a bumpy rash on his back.  She reports associated dehydration and constipation.   Past Medical History:  Diagnosis Date  . Atherosclerotic PVD with ulceration ([place])    left foot  . Bacteremia   . CAD (coronary artery disease) 2007   Dr Meda Coffee -  non-STEMI, .occluded circumflex.. Taxus stent placed...residual 80% LAD...50% RCA  . Cardiomyopathy, ischemic    EF 45% per ECHO 2008  //   EF 25%, echo, August, 2013 //  Echo (8/15):  Mild LVH, EF 30-35%, ant-lat and lat AK, inf HK, Gr 1 DD, mild MR, mild LAE  . Carotid artery disease ([place])     a.  Doppler, February, 2012, 0-39% bilateral,Mild smooth plaque;  b.  Carotid US (8/15):  Bilateral 1-39% ICA >>> F/u 2 years  . Constipation   . Dementia ([place])   . Diabetes mellitus    INSULIN-DEPENDent  . Fatigue SEVERE  . H/O pleural effusion 2008   POST THORACENTESIS  . History of colon polyps PRECANCEROUS  . Hyperlipidemia   . Hypertension   . Impotence   . Increased prostate specific antigen (PSA) velocity   . Insomnia   . Lung nodule    resolved 11-2006 CT Chest  . Multiple sclerosis ([place]) [place] -- LAST VISIT 11-20-2010  NOTE W/ CHART  . PAC (premature atrial contraction)    December, 2013  . Pulmonary hypertension ([place])    moderate ECHO Jan 2008  . Scoliosis associated with other condition   . Second degree Mobitz I AV block 11/12/2018  . Sleep apnea   . Systolic heart failure   . TIA (transient ischemic attack)   . Tobacco abuse    quit   . Urinary retention    dx ~ 2-12, like from [place], now with a catheter, saw urology  . Urinary tract infection    hx of    Patient Active Problem List   Diagnosis Date Noted  . Second degree Mobitz I AV block 11/12/2018  .  Abnormal liver function   . Pneumonia 03/27/2018  . UTI due to Klebsiella species 11/24/2017  . Hypoglycemia   . Gram-negative bacteremia   . Cool skin 07/18/2017  . Hematuria 04/29/2017  . GERD (gastroesophageal reflux disease) 04/10/2016  . Coronary artery disease involving native coronary artery of native heart without angina pectoris 12/28/2015  . PAD (peripheral artery disease) ([place]) 12/28/2015  . Symptomatic bradycardia 08/14/2015  . Chronic systolic heart failure ([place]) 08/14/2015  . Phantom limb pain ([place]) 12/08/2014  . Syncope 11/01/2014  . Severe sepsis with septic shock ([place]) 08/18/2014  . Acute lower UTI 08/18/2014  . Elevated troponin 08/18/2014  . 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 Paraparesis of both lower limbs ([place]) 05/31/2014  .  Scoliosis, thoracogenic, acquired 03/15/2014  . Mild cognitive impairment 02/11/2014  . PVD (peripheral vascular disease) ([place]) 08/13/2013  . 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 PAC (premature atrial contraction)   . Peripheral vascular disease, unspecified ([place]) 07/03/2012  . 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 Ejection fraction   . Cardiomyopathy, ischemic   . Pulmonary hypertension ([place])   . Hemiparesis ([place])   . Essential hypertension   . Carotid artery disease ([place])   . Hyperlipidemia 07/31/2007  . BPH (benign prostatic hyperplasia) 04/15/2007  . DMII (diabetes mellitus, type 2) ([place]) 12/18/2006    Past Surgical History:  Procedure Laterality Date  . ABDOMINAL ANGIOGRAM  12/17/2011   Procedure: ABDOMINAL ANGIOGRAM;  Surgeon: Serafina Mitchell, MD;  Location[facility] CATH LAB;  Service: Cardiovascular;;  . ABDOMINAL AORTAGRAM N/A 08/19/2013   Procedure: ABDOMINAL Maxcine Ham;  Surgeon: Conrad Willits, MD;  Location: [facility] CATH LAB;  Service: Cardiovascular;  Laterality: N/A;  . AMPUTATION  03/17/2012   Procedure: AMPUTATION ABOVE KNEE;  Surgeon: Conrad Escalante, MD;  Location: [facility];  Service: Vascular;  Laterality: Left;  . AMPUTATION  06/03/2012   Procedure: AMPUTATION ABOVE KNEE;  Surgeon: Conrad Caldwell, MD;  Location: [facility];  Service: Vascular;  Laterality: Right;  . CHOLECYSTECTOMY N/A 10/25/2014   Procedure: LAPAROSCOPIC CHOLECYSTECTOMY ;  Surgeon: Coralie Keens, MD;  Location: [facility];  Service: General;  Laterality: N/A;  . CORONARY ANGIOPLASTY WITH STENT PLACEMENT  05-01-2006   OCCLUDED CIRCUMFLEX -- TAXUS STENT PLACMENT  AND RESIDUAL 80% LAD,  50% RCA  . CYSTOSCOPY  07/30/2011   Procedure: CYSTOSCOPY;  Surgeon: Hanley Ben, MD;  Location[facility];  Service: Urology;  Laterality: N/A;  . ERCP N/A 08/25/2014   Procedure: ENDOSCOPIC RETROGRADE CHOLANGIOPANCREATOGRAPHY (ERCP);  Surgeon: Ladene Artist, MD;   Location: [facility] ENDOSCOPY;  Service: Endoscopy;  Laterality: N/A;  . FEMORAL-POPLITEAL BYPASS GRAFT  03/11/2012   Procedure: BYPASS GRAFT FEMORAL-POPLITEAL ARTERY;  Surgeon: Conrad Rockford, MD;  Location: [facility];  Service: Vascular;  Laterality: Left;  embolectomy left lower leg  . FEMORAL-TIBIAL BYPASS GRAFT  03/11/2012   Procedure: BYPASS GRAFT FEMORAL-TIBIAL ARTERY;  Surgeon: Conrad Avenel, MD;  Location: [facility] OR;  Service: Vascular;  Laterality: Left;  Left Femoral -Tibial trunk bypass, Endarterectomy of Tibial- Peroneal trunk with vein angioplasty.  [facility] Kitchen HERNIA REPAIR  1990   (R)  . INTRAOPERATIVE ARTERIOGRAM  03/11/2012   Procedure: INTRA OPERATIVE ARTERIOGRAM;  Surgeon: Conrad Reynolds, MD;  Location: [facility];  Service: Vascular;  Laterality: Left;  . LOWER EXTREMITY ANGIOGRAM Bilateral 12/17/2011   Procedure: LOWER EXTREMITY ANGIOGRAM;  Surgeon: Serafina Mitchell, MD;  Location: [facility] CATH LAB;  Service:  Cardiovascular;  Laterality: Bilateral;  bil lower extrem angio  . THORACENTESIS  2008   PLEURAL EFFUSION  . TRANSURETHRAL RESECTION OF PROSTATE  07/30/2011   Procedure: TRANSURETHRAL RESECTION OF THE PROSTATE (TURP);  Surgeon: Hanley Ben, MD;  Location: [facility];  Service: Urology;  Laterality: N/A;        Home Medications    Prior to Admission medications   Medication Sig Start Date End Date Taking? Authorizing Provider  albuterol (PROVENTIL HFA;VENTOLIN HFA) 108 (90 Base) MCG/ACT inhaler Inhale 2 puffs into the lungs every 6 (six) hours as needed for wheezing or shortness of breath. 12/28/15  Yes Dorothy Spark, MD  aspirin EC 81 MG tablet Take 1 tablet (81 mg total) by mouth daily. 07/07/17  Yes Dorothy Spark, MD  baclofen (LIORESAL) 10 MG tablet Take 0.5 tablets (5 mg total) by mouth 2 (two) times daily. 07/10/18  Yes Jaffe, Adam R, DO  cetirizine (ZYRTEC) 10 MG tablet Take 10 mg by mouth daily. 10/19/18  Yes [provider]  Cholecalciferol (VITAMIN D-3) 1000 units  CAPS Take 2,000 Units by mouth daily.   Yes [provider]  diclofenac sodium (VOLTAREN) 1 % GEL Apply 1 application topically 2 (two) times daily as needed (FOR PAIN).  03/09/12  Yes Kirsteins, Luanna Salk, MD  furosemide (LASIX) 20 MG tablet Take 1 tablet (20 mg total) by mouth daily as needed for fluid or edema (or shortness of breath). 11/01/17  Yes Lavina Hamman, MD  guaiFENesin (ROBITUSSIN) 100 MG/5ML SOLN Take 5 mLs by mouth every 4 (four) hours as needed for cough or to loosen phlegm.   Yes [provider]  insulin aspart protamine - aspart (NOVOLOG 70/30 MIX) (70-30) 100 UNIT/ML FlexPen Inject 0.05 mLs (5 Units total) into the skin 2 (two) times daily. Patient taking differently: Inject 5 Units into the skin 2 (two) times daily as needed (if BGL is >100).  03/12/17  Yes Burns, Claudina Lick, MD  lisinopril (PRINIVIL,ZESTRIL) 10 MG tablet TAKE 1 TABLET BY MOUTH EVERY DAY Patient taking differently: Take 10 mg by mouth daily.  05/26/17  Yes Burns, Claudina Lick, MD  Multiple Vitamins-Minerals (MULTIVITAMIN WITH MINERALS) tablet Take 1 tablet by mouth daily.   Yes [provider]  pantoprazole (PROTONIX) 40 MG tablet Take 1 tablet (40 mg total) by mouth daily with lunch. 12/22/17  Yes Burns, Claudina Lick, MD  pregabalin (LYRICA) 150 MG capsule Take 150-300 mg by mouth See admin instructions. Take 150 mg by mouth in the morning and 300 mg at bedtime   Yes [provider]  Propylene Glycol (SYSTANE BALANCE OP) Place 1-2 drops into both eyes daily.   Yes [provider]  rosuvastatin (CRESTOR) 20 MG tablet TAKE 1 TABLET(20 MG) BY MOUTH DAILY Patient taking differently: Take 20 mg by mouth daily.  04/13/18  Yes Burns, Claudina Lick, MD  traMADol (ULTRAM) 50 MG tablet Take 1 tablet (50 mg total) by mouth every 4 (four) hours as needed. Patient taking differently: Take 50 mg by mouth every 4 (four) hours as needed (for pain).  06/10/17  Yes Jaffe, Adam R, DO  traZODone (DESYREL) 100 MG  tablet Take 100 mg by mouth at bedtime as needed for sleep. 11/09/18  Yes [provider]  azelastine (ASTELIN) 0.1 % nasal spray Place 2 sprays into both nostrils at bedtime as needed for rhinitis. Use in each nostril as directed Patient not taking: Reported on 03/27/2018 08/28/15   Colon Branch,  MD  glucagon (GLUCAGEN) 1 MG SOLR injection Inject 1 mg into the muscle once as needed for low blood sugar. Reported on 11/16/2015    [provider]  nicotine (NICODERM CQ - DOSED IN MG/24 HOURS) 14 mg/24hr patch Place 1 patch (14 mg total) onto the skin daily. Patient not taking: Reported on 11/12/2018 11/02/17   Lavina Hamman, MD  saccharomyces boulardii (FLORASTOR) 250 MG capsule Take 1 capsule (250 mg total) by mouth 2 (two) times daily. Patient not taking: Reported on 11/12/2018 04/02/18   Barton Dubois, MD    Family History Family History  Problem Relation Age of Onset  . Heart attack Mother 67  . Heart disease Mother   . Stroke Mother   . Hyperlipidemia Mother   . Hypertension Mother   . COPD Father   . Peripheral vascular disease Father   . Diabetes Brother   . Heart disease Brother   . Hypertension Brother   . Heart attack Brother   . Diabetes Daughter   . Colon cancer Neg Hx   . Prostate cancer Neg Hx     Social History Social History   Tobacco Use  . Smoking status: Former Smoker    Years: 50.00    Last attempt to quit: 07/22/2012    Years since quitting: 6.3  . Smokeless tobacco: Current User  . Tobacco comment: pt states that he is using E-cigs  Substance Use Topics  . Alcohol use: No    Alcohol/week: 0.0 standard drinks  . Drug use: No     Allergies   Bee venom; Beta adrenergic blockers; Carvedilol; Influenza vaccines; Atorvastatin; and Latex   Review of Systems Review of Systems  All other systems reviewed and are negative.    Physical Exam Updated Vital Signs BP (!) 106/93   Pulse (!) 44   Temp 98.5 F (36.9 C) (Oral)   Resp 19   Wt  63.5 kg   SpO2 98%   BMI 19.53 kg/m   Physical Exam Vitals signs and nursing note reviewed.  Constitutional:      Appearance: He is well-developed.  HENT:     Head: Normocephalic and atraumatic.  Cardiovascular:     Rate and Rhythm: Normal rate and regular rhythm.     Comments: bradycardia Pulmonary:     Effort: Pulmonary effort is normal. No respiratory distress.     Breath sounds: Normal breath sounds.  Abdominal:     Palpations: Abdomen is soft.     Tenderness: There is no abdominal tenderness. There is no guarding or rebound.  Genitourinary:    Comments: Condom cath in place with yellow urine in bag.   Musculoskeletal:        General: No tenderness.     Comments: Bilateral AKA  Skin:    General: Skin is warm and dry.     Comments: Faint dark macular rash on entire low back.    Neurological:     Mental Status: He is alert and oriented to person, place, and time.  Psychiatric:        Behavior: Behavior normal.      ED Treatments / Results  Labs (all labs ordered are listed, but only abnormal results are displayed) Labs Reviewed  COMPREHENSIVE METABOLIC PANEL - Abnormal; Notable for the following components:      Result Value   Glucose, Bld 139 (*)    BUN 24 (*)    Calcium 8.8 (*)    Albumin 3.2 (*)  All other components within normal limits  CBC WITH DIFFERENTIAL/PLATELET - Abnormal; Notable for the following components:   Hemoglobin 12.7 (*)    RDW 17.1 (*)    Platelets 128 (*)    All other components within normal limits  URINALYSIS, ROUTINE W REFLEX MICROSCOPIC - Abnormal; Notable for the following components:   Leukocytes,Ua TRACE (*)    All other components within normal limits  POCT I-STAT EG7 - Abnormal; Notable for the following components:   pCO2, Ven 40.0 (*)    pO2, Ven 176.0 (*)    All other components within normal limits  CULTURE, BLOOD (ROUTINE X 2)  CULTURE, BLOOD (ROUTINE X 2)  URINE CULTURE  SARS CORONAVIRUS 2 (HOSPITAL ORDER,  PERFORMED IN [facility])  LACTIC ACID, PLASMA  TROPONIN I  LACTIC ACID, PLASMA  I-STAT CREATININE, ED  CBG MONITORING, ED    EKG None  Radiology Dg Chest Port 1 View  Result Date: 11/12/2018 CLINICAL DATA:  Shortness of breath and chest pain EXAM: PORTABLE CHEST 1 VIEW COMPARISON:  March 27, 2018 FINDINGS: There is no appreciable edema or consolidation. There is mild atelectatic change in the left mid lung. Heart is mildly enlarged with pulmonary vascularity normal. No adenopathy. There is aortic atherosclerosis. There is thoracolumbar levoscoliosis. IMPRESSION: Mild left midlung atelectasis. No edema or consolidation. Heart mildly enlarged. Aortic Atherosclerosis (ICD10-I70.0). Electronically Signed   By: Lowella Grip III M.D.   On: 11/12/2018 10:43    Procedures Procedures (including critical care time) CRITICAL CARE Performed by: Quintella Reichert   Total critical care time: 40 minutes  Critical care time was exclusive of separately billable procedures and treating other patients.  Critical care was necessary to treat or prevent imminent or life-threatening deterioration.  Critical care was time spent personally by me on the following activities: development of treatment plan with patient and/or surrogate as well as nursing, discussions with consultants, evaluation of patient's response to treatment, examination of patient, obtaining history from patient or surrogate, ordering and performing treatments and interventions, ordering and review of laboratory studies, ordering and review of radiographic studies, pulse oximetry and re-evaluation of patient's condition.  Medications Ordered in ED Medications  albuterol (VENTOLIN HFA) 108 (90 Base) MCG/ACT inhaler 2 puff ( Inhalation MAR Hold 11/12/18 1323)  aspirin EC tablet 81 mg ( Oral Automatically Held 11/20/18 1000)  baclofen (LIORESAL) tablet 5 mg ( Oral Automatically Held 11/20/18 2200)  loratadine (CLARITIN)  tablet 10 mg ( Oral Automatically Held 11/20/18 1000)  Vitamin D-3 CAPS 2,000 Units ( Oral Automatically Held 11/20/18 1000)  diclofenac sodium (VOLTAREN) 1 % transdermal gel 1 application ( Topical MAR Hold 11/12/18 1323)  furosemide (LASIX) tablet 20 mg ( Oral Automatically Held 11/20/18 1000)  lisinopril (ZESTRIL) tablet 10 mg ( Oral Automatically Held 11/20/18 1000)  pantoprazole (PROTONIX) EC tablet 40 mg ( Oral Automatically Held 11/20/18 1200)  pregabalin (LYRICA) capsule 150-300 mg ( Oral MAR Hold 11/12/18 1323)  Propylene Glycol 0.6 % SOLN 1 drop ( Both Eyes Automatically Held 11/20/18 1000)  rosuvastatin (CRESTOR) tablet 20 mg ( Oral Automatically Held 11/20/18 1000)  traMADol (ULTRAM) tablet 50 mg ( Oral MAR Hold 11/12/18 1323)  traZODone (DESYREL) tablet 100 mg ( Oral MAR Hold 11/12/18 1323)  multivitamin with minerals tablet 1 tablet ( Oral Automatically Held 11/20/18 1000)  sodium chloride 0.9 % bolus 500 mL (1,000 mLs Intravenous New Bag/Given 11/12/18 0947)  cefTRIAXone (ROCEPHIN) 1 g in sodium chloride 0.9 % 100 mL IVPB (0 g  Intravenous Stopped 11/12/18 1208)  sodium chloride 0.9 % bolus 1,000 mL (0 mLs Intravenous Stopped 11/12/18 1208)     Initial Impression / Assessment and Plan / ED Course  I have reviewed the triage vital signs and the nursing notes.  Pertinent labs & imaging results that were available during my care of the patient were reviewed by me and considered in my medical decision making (see chart for details).        Patient here for evaluation of hypotension and bradycardia. He did have hypotension in the emergency department that improved with IV fluid resuscitation. He is bradycardia but well perfused on examination. EKG with movements type I versus mopeds type II block. He does have a history of sepsis in the past and he was initially treated with antibiotics for possible sepsis. On further evaluation current presentation is not consistent with sepsis, UA not consistent with  UTI. Discussed the case with cardiology and he was evaluated in the emergency department with plan to admit for further evaluation and management. Discussed with patient findings of studies and recommendation for admission and he is in agreement with treatment plan.  Final Clinical Impressions(s) / ED Diagnoses   Final diagnoses:  None    ED Discharge Orders    None       Quintella Reichert, MD 11/12/18 1346

## 2018-11-12 NOTE — ED Notes (Signed)
Cardiology in with patient

## 2018-11-12 NOTE — ED Notes (Signed)
Cardiology MD at bedside.

## 2018-11-12 NOTE — H&P (Addendum)
Cardiology History and Physical:   Patient ID: RAMADAN COUEY; [id number]; 28-Nov-1940   Admit date: 11/12/2018 Date of Consult: 11/12/2018  Primary Care Provider: [name], MD Primary Cardiologist: [name], MD 01/23/2018 Primary Electrophysiologist:  None   Patient Profile:   CHRISTEN BEDOYA is a 78 y.o. 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  History of Present Illness:   Mr. Baumgartner is not able to tell me that much.  He states his wife takes care of him with assistance at home.  He denies any fevers or chills.  He denies any cough or cold symptoms.  According to phone notes on 4/16, he stated he felt like there was something in his throat and his pulse was around 44.  His wife thought he was dehydrated.  She was going to give him Pedialyte and watch him.  4/23 phone note, his wife called because the patient's blood pressure and heart rate were low and she was concerned that he was going into sepsis again.  Upon arrival to the emergency room, he was complaining of rash on his back.  EMS reported a systolic blood pressure in the 80s, treated with IV fluids and improved.  He is reported to be more confused than usual and sleepy.  His wife feels he is dehydrated and he has been constipated.  He denies chest pain or shortness of breath.  He has no current complaints.  He is oriented to name and place.   Past Medical History:  Diagnosis Date  . Atherosclerotic PVD with ulceration ([place])    left foot  . Bacteremia   . CAD (coronary artery disease) 2007   Dr Meda Coffee -  non-STEMI, .occluded circumflex.. Taxus stent placed...residual 80% LAD...50% RCA  . Cardiomyopathy, ischemic    EF 45% per ECHO 2008  //   EF 25%, echo, August, 2013 //  Echo (8/15):  Mild LVH, EF  30-35%, ant-lat and lat AK, inf HK, Gr 1 DD, mild MR, mild LAE  . Carotid artery disease ([place])    a.  Doppler, February, 2012, 0-39% bilateral,Mild smooth plaque;  b.  Carotid US (8/15):  Bilateral 1-39% ICA >>> F/u 2 years  . Constipation   . Dementia ([place])   . Diabetes mellitus    INSULIN-DEPENDent  . Fatigue SEVERE  . H/O pleural effusion 2008   POST THORACENTESIS  . History of colon polyps PRECANCEROUS  . Hyperlipidemia   . Hypertension   . Impotence   . Increased prostate specific antigen (PSA) velocity   . Insomnia   . Lung nodule    resolved 11-2006 CT Chest  . Multiple sclerosis ([place]) [place] -- LAST VISIT 11-20-2010  NOTE W/ CHART  . PAC (premature atrial contraction)    December, 2013  . Pulmonary hypertension ([place])    moderate ECHO Jan 2008  . Scoliosis associated with other condition   . Second degree Mobitz I AV block 11/12/2018  . Sleep apnea   . Systolic heart failure   . TIA (transient ischemic attack)   . Tobacco abuse    quit   . Urinary retention    dx ~ 2-12, like from [place], now with a catheter,  saw urology  . Urinary tract infection    hx of    Past Surgical History:  Procedure Laterality Date  . ABDOMINAL ANGIOGRAM  12/17/2011   Procedure: ABDOMINAL ANGIOGRAM;  Surgeon: Serafina Mitchell, MD;  Location: [facility] CATH LAB;  Service: Cardiovascular;;  . ABDOMINAL AORTAGRAM N/A 08/19/2013   Procedure: ABDOMINAL Maxcine Ham;  Surgeon: Conrad Van Horne, MD;  Location: [facility] CATH LAB;  Service: Cardiovascular;  Laterality: N/A;  . AMPUTATION  03/17/2012   Procedure: AMPUTATION ABOVE KNEE;  Surgeon: Conrad Windham, MD;  Location: [place];  Service: Vascular;  Laterality: Left;  . AMPUTATION  06/03/2012   Procedure: AMPUTATION ABOVE KNEE;  Surgeon: Conrad Hemingway, MD;  Location: [place];  Service: Vascular;  Laterality: Right;  . CHOLECYSTECTOMY N/A 10/25/2014   Procedure: LAPAROSCOPIC CHOLECYSTECTOMY ;  Surgeon: Coralie Keens, MD;  Location: [place];   Service: General;  Laterality: N/A;  . CORONARY ANGIOPLASTY WITH STENT PLACEMENT  05-01-2006   OCCLUDED CIRCUMFLEX -- TAXUS STENT PLACMENT  AND RESIDUAL 80% LAD,  50% RCA  . CYSTOSCOPY  07/30/2011   Procedure: CYSTOSCOPY;  Surgeon: Hanley Ben, MD;  Location: [facility];  Service: Urology;  Laterality: N/A;  . ERCP N/A 08/25/2014   Procedure: ENDOSCOPIC RETROGRADE CHOLANGIOPANCREATOGRAPHY (ERCP);  Surgeon: Ladene Artist, MD;  Location: [facility] ENDOSCOPY;  Service: Endoscopy;  Laterality: N/A;  . FEMORAL-POPLITEAL BYPASS GRAFT  03/11/2012   Procedure: BYPASS GRAFT FEMORAL-POPLITEAL ARTERY;  Surgeon: Conrad Dover, MD;  Location: [place];  Service: Vascular;  Laterality: Left;  embolectomy left lower leg  . FEMORAL-TIBIAL BYPASS GRAFT  03/11/2012   Procedure: BYPASS GRAFT FEMORAL-TIBIAL ARTERY;  Surgeon: Conrad Janesville, MD;  Location: [facility] OR;  Service: Vascular;  Laterality: Left;  Left Femoral -Tibial trunk bypass, Endarterectomy of Tibial- Peroneal trunk with vein angioplasty.  Marland Kitchen HERNIA REPAIR  1990   (R)  . INTRAOPERATIVE ARTERIOGRAM  03/11/2012   Procedure: INTRA OPERATIVE ARTERIOGRAM;  Surgeon: Conrad Bucklin, MD;  Location: [place];  Service: Vascular;  Laterality: Left;  . LOWER EXTREMITY ANGIOGRAM Bilateral 12/17/2011   Procedure: LOWER EXTREMITY ANGIOGRAM;  Surgeon: Serafina Mitchell, MD;  Location: [facility] CATH LAB;  Service: Cardiovascular;  Laterality: Bilateral;  bil lower extrem angio  . THORACENTESIS  2008   PLEURAL EFFUSION  . TRANSURETHRAL RESECTION OF PROSTATE  07/30/2011   Procedure: TRANSURETHRAL RESECTION OF THE PROSTATE (TURP);  Surgeon: Hanley Ben, MD;  Location: [facility];  Service: Urology;  Laterality: N/A;     Prior to Admission medications   Medication Sig Start Date End Date Taking? Authorizing Provider  albuterol (PROVENTIL HFA;VENTOLIN HFA) 108 (90 Base) MCG/ACT inhaler Inhale 2 puffs into the lungs every 6 (six) hours as needed for wheezing or  shortness of breath. 12/28/15   Dorothy Spark, MD  aspirin EC 81 MG tablet Take 1 tablet (81 mg total) by mouth daily. 07/07/17   Dorothy Spark, MD  azelastine (ASTELIN) 0.1 % nasal spray Place 2 sprays into both nostrils at bedtime as needed for rhinitis. Use in each nostril as directed Patient not taking: Reported on 03/27/2018 08/28/15   Colon Branch, MD  baclofen (LIORESAL) 10 MG tablet Take 0.5 tablets (5 mg total) by mouth 2 (two) times daily. 07/10/18   Pieter Partridge, DO  Cholecalciferol (VITAMIN D-3) 1000 units CAPS Take 2,000 Units by mouth daily.    [provider]  diclofenac sodium (VOLTAREN) 1 % GEL Apply 1 application topically 2 (  two) times daily as needed (FOR PAIN).  03/09/12   Kirsteins, Luanna Salk, MD  furosemide (LASIX) 20 MG tablet Take 1 tablet (20 mg total) by mouth daily as needed for fluid or edema (or shortness of breath). 11/01/17   Lavina Hamman, MD  glucagon (GLUCAGEN) 1 MG SOLR injection Inject 1 mg into the muscle once as needed for low blood sugar. Reported on 11/16/2015    [provider]  guaiFENesin (ROBITUSSIN) 100 MG/5ML SOLN Take 5 mLs by mouth every 4 (four) hours as needed for cough or to loosen phlegm.    [provider]  insulin aspart protamine - aspart (NOVOLOG 70/30 MIX) (70-30) 100 UNIT/ML FlexPen Inject 0.05 mLs (5 Units total) into the skin 2 (two) times daily. Patient taking differently: Inject 5 Units into the skin 2 (two) times daily as needed (if BGL is >100).  03/12/17   Burns, Claudina Lick, MD  lisinopril (PRINIVIL,ZESTRIL) 10 MG tablet TAKE 1 TABLET BY MOUTH EVERY DAY 05/26/17   [name], MD  Multiple Vitamins-Minerals (MULTIVITAMIN WITH MINERALS) tablet Take 1 tablet by mouth daily.    [provider]  nicotine (NICODERM CQ - DOSED IN MG/24 HOURS) 14 mg/24hr patch Place 1 patch (14 mg total) onto the skin daily. Patient taking differently: Place 14 mg onto the skin daily as needed (ONLY IF SMOKING).  11/02/17    Lavina Hamman, MD  pantoprazole (PROTONIX) 40 MG tablet Take 1 tablet (40 mg total) by mouth daily with lunch. 12/22/17   [name], MD  pregabalin (LYRICA) 150 MG capsule Take 150-300 mg by mouth See admin instructions. Take 150 mg by mouth in the morning and 300 mg at bedtime    [provider]  Propylene Glycol (SYSTANE BALANCE OP) Place 1-2 drops into both eyes daily.    [provider]  rosuvastatin (CRESTOR) 20 MG tablet TAKE 1 TABLET(20 MG) BY MOUTH DAILY 04/13/18   Burns, Claudina Lick, MD  saccharomyces boulardii (FLORASTOR) 250 MG capsule Take 1 capsule (250 mg total) by mouth 2 (two) times daily. 04/02/18   Barton Dubois, MD  traMADol (ULTRAM) 50 MG tablet Take 1 tablet (50 mg total) by mouth every 4 (four) hours as needed. Patient taking differently: Take 50 mg by mouth every 4 (four) hours as needed (for pain).  06/10/17   Pieter Partridge, DO    Inpatient Medications: Scheduled Meds:  Continuous Infusions:  PRN Meds:   Allergies:    Allergies  Allergen Reactions  . Bee Venom Anaphylaxis  . Beta Adrenergic Blockers Other (See Comments)    Bradycardia with Carvedilol   . Carvedilol Other (See Comments)    Bradycardia with all Beta blockers   . Influenza Vaccines Other (See Comments)    Made patient "sick for months"  . Atorvastatin Other (See Comments)    Severe pain in stumps  . Latex Rash    Social History:   Social History   Socioeconomic History  . Marital status: Married    Spouse name: Not on file  . Number of children: 2  . Years of education: Not on file  . Highest education level: Not on file  Occupational History  . Occupation: disable    Employer: RETIRED  Social Needs  . Financial resource strain: Not on file  . Food insecurity:    Worry: Not on file    Inability: Not on file  . Transportation needs:    Medical: Not on file  Non-medical: Not on file  Tobacco Use  . Smoking status: Former Smoker    Years: 50.00    Last  attempt to quit: 07/22/2012    Years since quitting: 6.3  . Smokeless tobacco: Current User  . Tobacco comment: pt states that he is using E-cigs  Substance and Sexual Activity  . Alcohol use: No    Alcohol/week: 0.0 standard drinks  . Drug use: No  . Sexual activity: Never    Comment: electronic cigarettes no nicotene  Lifestyle  . Physical activity:    Days per week: Not on file    Minutes per session: Not on file  . Stress: Not on file  Relationships  . Social connections:    Talks on phone: Not on file    Gets together: Not on file    Attends religious service: Not on file    Active member of club or organization: Not on file    Attends meetings of clubs or organizations: Not on file    Relationship status: Not on file  . Intimate partner violence:    Fear of current or ex partner: Not on file    Emotionally abused: Not on file    Physically abused: Not on file    Forced sexual activity: Not on file  Other Topics Concern  . Not on file  Social History Narrative   Lives w/ wife    Family History:   Family History  Problem Relation Age of Onset  . Heart attack Mother 3  . Heart disease Mother   . Stroke Mother   . Hyperlipidemia Mother   . Hypertension Mother   . COPD Father   . Peripheral vascular disease Father   . Diabetes Brother   . Heart disease Brother   . Hypertension Brother   . Heart attack Brother   . Diabetes Daughter   . Colon cancer Neg Hx   . Prostate cancer Neg Hx    Family Status:  Family Status  Relation Name Status  . Mother  Deceased       11/02/2003  . Father  Deceased       11-01-05  . Brother  (Not Specified)  . Daughter  (Not Specified)  . MGM  Deceased  . MGF  Deceased  . PGM  Deceased  . PGF  Deceased  . Neg Hx  (Not Specified)    ROS:  Please see the history of present illness.  All other ROS reviewed and negative as far as he can remember     Physical Exam/Data:   Vitals:   11/12/18 0923 11/12/18 0930 11/12/18 0931  BP: (!)  95/49 99/87   Pulse: (!) 44 (!) 48   Resp: 18 13   Temp: 98.3 F (36.8 C) 98.5 F (36.9 C)   TempSrc: Oral Oral   SpO2: 97% 97%   Weight:   63.5 kg   No intake or output data in the 24 hours ending 11/12/18 1142 Filed Weights   11/12/18 0931  Weight: 63.5 kg   Body mass index is 19.53 kg/m.  General: Well developed, chronically ill-appearing male, in no acute distress HEENT: normal for age Lymph: no adenopathy Neck: no JVD seen Endocrine:  No thryomegaly Vascular: No carotid bruits; upper extremity pulses 2+, s/p bilat AKA  Cardiac:  normal S1, S2; RRR; soft murmur  Lungs:  c Rales bases bilaterally, no wheezing, rhonchi   Abd: soft, nontender, no hepatomegaly  Ext: no edema, s/p bilat BKA Musculoskeletal:  No deformities, BUE strength weak but equal, BLE strength not tested Skin: warm and dry  Neuro:  CNs 2-12 intact, no focal abnormalities noted Psych:  Normal affect   EKG:  The EKG was personally reviewed and demonstrates:  Mobitz I with underlying sinus bradycardia, underlying heart rate 50, but when betas dropped so heart rate is 49.  PR interval historically 350 ms, now 440 ms.  Patient previously had a right bundle, now has a left bundle with QRS duration 128 ms.  QT/QTc 513/464  Telemetry:  Telemetry was personally reviewed and demonstrates: Mobitz 1 with bradycardia, heart rate <30 at times.  Relevant CV Studies:  ECHO: 10/2017 - Left ventricle: The cavity size was normal. There was moderate   concentric hypertrophy. Systolic function was moderately to   severely reduced. The estimated ejection fraction was in the   range of 30% to 35%. There is akinesis of the basal and mid   inferior, inferolateral and apical inferior walls. Doppler   parameters are consistent with abnormal left ventricular   relaxation (grade 1 diastolic dysfunction). - Aortic valve: There was trivial regurgitation. - Mitral valve: There was mild regurgitation. - Right ventricle: The cavity  size was normal. Wall thickness was   normal. Systolic function was normal. - Right atrium: The atrium was normal in size. - Tricuspid valve: There was moderate regurgitation. - Pulmonic valve: There was trivial regurgitation. - Pulmonary arteries: Systolic pressure was mildly increased. PA   peak pressure: 39 mm Hg (S). - Inferior vena cava: The vessel was normal in size. - Pericardium, extracardiac: There was no pericardial effusion.  PV CATH: 08/19/2013 FINDING(S):  Aorta: Patent with extensive calcific plaque  Superior mesenteric artery: patent  Celiac artery: not visualized   Right Left  RA Patent Patent  CIA Patent but calcified, 50% stenosis Patent but calcified  EIA Patent Patent  IIA Patent proximally, occluded distally Patent proximally, occluded distally  CFA Patent but diseased Patent but disease  SFA Occluded Occluded  PFA Patent but diseased Patent but diseased  Med rx  CATH:  FINDINGS:  Aortic pressure 93/62 with a mean of 75, left ventricular  pressure 93/11 with an end-diastolic pressure of 25.   Coronary artery angiography:  The left mainstem is very short.  It appears there may even be a separate orifice to the LAD and left circumflex.  The LAD has mild nonobstructive disease in its proximal segments.  In the mid- LAD just after the first diagonal, there is a 50% area of stenosis that is calcified.  There is an 80% second diagonal lesion at its origin.  This is a small diameter artery.  The remainder of the mid-distal LAD appear angiographically normal.   Left circumflex is occluded in its proximal portion.   The right coronary artery is diffusely diseased.  It is nondominant.  Proximally, there is 50% disease that gives off an RV marginal in its mid- segment.  There is diffuse nonobstructive disease beyond the RV marginal branch.   Left ventriculogram showed inferolateral akinesis with an estimated left  ventricular ejection fraction of 40%.    ASSESSMENT:  1. A 100% occlusion of the left circumflex.  2. Moderate mid-LAD disease.  3. Diffuse nonobstructive right coronary artery disease in a nondominant right coronary artery.  4. Mild segmental left ventricular dysfunction.   DISCUSSION:  The patient's occluded left circumflex angiographically  appeared acute.  I elected to perform PCI on this vessel.    A 2.5 x 20 mm  Taxus  stent was deployed at nine atmospheres.  Following stent deployment, there was TIMI 3 flow throughout a dominant left circumflex system as well as there were also two obtuse marginals.  Following stent deployment, a 2.5 mm noncompliant balloon was used to post dilate the stent.  At the conclusion of the case there was TIMI 3 flow in both the left circumflex and obtuse marginal branch.  Laboratory Data:  Chemistry Recent Labs  Lab 11/12/18 0939 11/12/18 1011  NA 139 140  K 4.0 4.0  CL 107  --   CO2 22  --   GLUCOSE 139*  --   BUN 24*  --   CREATININE 0.93 0.80  CALCIUM 8.8*  --   GFRNONAA >60  --   GFRAA >60  --   ANIONGAP 10  --     Lab Results  Component Value Date   ALT 11 11/12/2018   AST 15 11/12/2018   ALKPHOS 46 11/12/2018   BILITOT 0.6 11/12/2018   Hematology Recent Labs  Lab 11/12/18 0939 11/12/18 1011  WBC 5.2  --   RBC 4.87  --   HGB 12.7* 13.3  HCT 39.9 39.0  MCV 81.9  --   MCH 26.1  --   MCHC 31.8  --   RDW 17.1*  --   PLT 128*  --    Cardiac Enzymes Recent Labs  Lab 11/12/18 0939  TROPONINI <0.03   No results for input(s): TROPIPOC in the last 168 hours.  BNPNo results for input(s): BNP, PROBNP in the last 168 hours.   TSH:  Lab Results  Component Value Date   TSH 0.39 11/25/2017   HgbA1c: Lab Results  Component Value Date   HGBA1C 7.0 (H) 11/25/2017   Magnesium:  Magnesium  Date Value Ref Range Status  10/30/2017 1.9 1.7 - 2.4 mg/dL Final    Comment:    Performed at [facility], [place] [address] [place], [place] [address]      Radiology/Studies:  Dg Chest Port 1 View  Result Date: 11/12/2018 CLINICAL DATA:  Shortness of breath and chest pain EXAM: PORTABLE CHEST 1 VIEW COMPARISON:  March 27, 2018 FINDINGS: There is no appreciable edema or consolidation. There is mild atelectatic change in the left mid lung. Heart is mildly enlarged with pulmonary vascularity normal. No adenopathy. There is aortic atherosclerosis. There is thoracolumbar levoscoliosis. IMPRESSION: Mild left midlung atelectasis. No edema or consolidation. Heart mildly enlarged. Aortic Atherosclerosis (ICD10-I70.0). Electronically Signed   By: Lowella Grip III M.D.   On: 11/12/2018 10:43    Assessment and Plan:   1.  Mobitz 1 heart block with symptomatic bradycardia: - It is felt that his hypotension was related to bradycardia with a contribution from dehydration, blood pressure improved initially after IVF but started dropping again. -He had Mobitz 1 in April 2019 admission for sepsis, heart rate at that time was 50. He has not been on any rate lowering medications or AV nodal blocking agents since then. - dentition is poor, but no one will pull his teeth until his HR is stabilized. - with a HR in the 20s at times, a pacemaker is indicated. - the risks and benefits were discussed with the patient, and with his wife by phone. His wife gave verbal permission by phone, to myself and an RN, for him to get a PPM.   2.  Chronic systolic CHF: - Volume status good by exam -Follow closely with hydration  3. Possible sepsis -  initially, his wife felt he was septic. - However, his WBCs are normal, he is afebrile, UA is only mildly abnormal, lactic acid level is normal. - COVID swab bad already been ordered, f/u on results - currently no indication for COVID precautions. - monitor for symptoms - he will be on ABX because of the pacemaker and poor dentition    For questions or updates, please contact [place] Please consult [email or web address] for  contact info under Cardiology/STEMI.   Jonetta Speak, PA-C  11/12/2018 11:42 AM  EP Attending  Patient seen and examined. Agree with above. The patient presents today with symptomatic bradycardia and hypotension. His appetite has been down. He denies fever or chills and temp is normal as is the WBC. He wears a condom cath and does not have active evidence of infection. He has known LV dysfunction with an EF of 25%. He has multiple comorbidities with severe vascular disease s/p bilateral AKA's. He also has known CAD. ECG's have been reviewed and demonstrate sinus brady with 2:1 AV block and a very long first degree AV block when he conducts. He previously had RBBB and now has LBBB.   Mikle Bosworth.D.

## 2018-11-12 NOTE — ED Notes (Signed)
Pt denies any new s/sx at this time.  No CP, SOB or dizziness.  Remains alert and oriented.

## 2018-11-12 NOTE — ED Notes (Signed)
Help get patient on the monitor did ekg shown to er doctor Dr Ralene Bathe patient is resting with call bell in reach

## 2018-11-12 NOTE — Telephone Encounter (Signed)
Patient's wife is calling to make you aware patient needs to go to the hospital again.  His BP is low, low pluse, she feels he is going into sepsis again. So she is taking him to the hospital.

## 2018-11-12 NOTE — ED Notes (Signed)
Verbal consent obtained with pts wife via phone with Suanne Marker PA and this RN.

## 2018-11-12 NOTE — Telephone Encounter (Signed)
Will route to Dr Meda Coffee as an Juluis Rainier.

## 2018-11-12 NOTE — ED Notes (Signed)
Pt denies any current sx of CP, SOB, dizziness, pain, cough or fever.

## 2018-11-13 ENCOUNTER — Encounter (HOSPITAL_COMMUNITY): Payer: Self-pay | Admitting: Internal Medicine

## 2018-11-13 ENCOUNTER — Observation Stay (HOSPITAL_COMMUNITY): Payer: Medicare Other

## 2018-11-13 DIAGNOSIS — R4182 Altered mental status, unspecified: Secondary | ICD-10-CM | POA: Diagnosis not present

## 2018-11-13 DIAGNOSIS — Z7401 Bed confinement status: Secondary | ICD-10-CM | POA: Diagnosis not present

## 2018-11-13 DIAGNOSIS — E1151 Type 2 diabetes mellitus with diabetic peripheral angiopathy without gangrene: Secondary | ICD-10-CM | POA: Diagnosis not present

## 2018-11-13 DIAGNOSIS — J9601 Acute respiratory failure with hypoxia: Secondary | ICD-10-CM | POA: Diagnosis not present

## 2018-11-13 DIAGNOSIS — R5381 Other malaise: Secondary | ICD-10-CM | POA: Diagnosis not present

## 2018-11-13 DIAGNOSIS — R0602 Shortness of breath: Secondary | ICD-10-CM | POA: Diagnosis not present

## 2018-11-13 DIAGNOSIS — B961 Klebsiella pneumoniae [K. pneumoniae] as the cause of diseases classified elsewhere: Secondary | ICD-10-CM | POA: Diagnosis not present

## 2018-11-13 DIAGNOSIS — Z03818 Encounter for observation for suspected exposure to other biological agents ruled out: Secondary | ICD-10-CM | POA: Diagnosis not present

## 2018-11-13 DIAGNOSIS — J181 Lobar pneumonia, unspecified organism: Secondary | ICD-10-CM | POA: Diagnosis not present

## 2018-11-13 DIAGNOSIS — Z20828 Contact with and (suspected) exposure to other viral communicable diseases: Secondary | ICD-10-CM | POA: Diagnosis not present

## 2018-11-13 DIAGNOSIS — J069 Acute upper respiratory infection, unspecified: Secondary | ICD-10-CM | POA: Diagnosis not present

## 2018-11-13 DIAGNOSIS — M2550 Pain in unspecified joint: Secondary | ICD-10-CM | POA: Diagnosis not present

## 2018-11-13 DIAGNOSIS — R531 Weakness: Secondary | ICD-10-CM | POA: Diagnosis not present

## 2018-11-13 DIAGNOSIS — F0390 Unspecified dementia without behavioral disturbance: Secondary | ICD-10-CM | POA: Diagnosis not present

## 2018-11-13 DIAGNOSIS — G9341 Metabolic encephalopathy: Secondary | ICD-10-CM | POA: Diagnosis not present

## 2018-11-13 DIAGNOSIS — J9811 Atelectasis: Secondary | ICD-10-CM | POA: Diagnosis not present

## 2018-11-13 DIAGNOSIS — J189 Pneumonia, unspecified organism: Secondary | ICD-10-CM | POA: Diagnosis not present

## 2018-11-13 DIAGNOSIS — R05 Cough: Secondary | ICD-10-CM | POA: Diagnosis not present

## 2018-11-13 DIAGNOSIS — Z950 Presence of cardiac pacemaker: Secondary | ICD-10-CM | POA: Diagnosis not present

## 2018-11-13 DIAGNOSIS — D649 Anemia, unspecified: Secondary | ICD-10-CM | POA: Diagnosis not present

## 2018-11-13 DIAGNOSIS — R001 Bradycardia, unspecified: Secondary | ICD-10-CM

## 2018-11-13 DIAGNOSIS — N390 Urinary tract infection, site not specified: Secondary | ICD-10-CM | POA: Diagnosis not present

## 2018-11-13 DIAGNOSIS — G8220 Paraplegia, unspecified: Secondary | ICD-10-CM | POA: Diagnosis not present

## 2018-11-13 DIAGNOSIS — I5042 Chronic combined systolic (congestive) and diastolic (congestive) heart failure: Secondary | ICD-10-CM | POA: Diagnosis not present

## 2018-11-13 HISTORY — DX: Bradycardia, unspecified: R00.1

## 2018-11-13 LAB — BASIC METABOLIC PANEL
Anion gap: 8 (ref 5–15)
BUN: 16 mg/dL (ref 8–23)
CO2: 23 mmol/L (ref 22–32)
Calcium: 8.9 mg/dL (ref 8.9–10.3)
Chloride: 111 mmol/L (ref 98–111)
Creatinine, Ser: 0.74 mg/dL (ref 0.61–1.24)
GFR calc Af Amer: >60 mL/min (ref >60)
GFR calc non Af Amer: >60 mL/min (ref >60)
Glucose, Bld: 113 mg/dL — ABNORMAL HIGH (ref 70–99)
Potassium: 4.2 mmol/L (ref 3.5–5.1)
Sodium: 142 mmol/L (ref 135–145)

## 2018-11-13 LAB — GLUCOSE, CAPILLARY: Glucose-Capillary: 103 mg/dL — ABNORMAL HIGH (ref 70–99)

## 2018-11-13 MED ORDER — ZOLPIDEM TARTRATE 5 MG PO TABS: 5.0000 mg | ORAL_TABLET | Freq: Every evening | ORAL | Status: DC | PRN

## 2018-11-13 MED ORDER — INSULIN ASPART 100 UNIT/ML ~~LOC~~ SOLN: 0.0000 [IU] | Freq: Three times a day (TID) | SUBCUTANEOUS | Status: DC

## 2018-11-13 MED ORDER — ACETAMINOPHEN 325 MG PO TABS: 650.0000 mg | ORAL_TABLET | ORAL | Status: DC | PRN

## 2018-11-13 MED ORDER — ALPRAZOLAM 0.25 MG PO TABS: 0.2500 mg | ORAL_TABLET | Freq: Two times a day (BID) | ORAL | Status: DC | PRN

## 2018-11-13 MED ORDER — AMOXICILLIN 500 MG PO TABS: 2000.0000 mg | ORAL_TABLET | Freq: Once | ORAL | 0 refills | Status: AC

## 2018-11-13 MED ORDER — INSULIN ASPART 100 UNIT/ML ~~LOC~~ SOLN: 0.0000 [IU] | Freq: Every day | SUBCUTANEOUS | Status: DC

## 2018-11-13 MED ORDER — YOU HAVE A PACEMAKER BOOK
Freq: Once | Status: AC
  Administered 2018-11-13: 03:00:00

## 2018-11-13 MED ORDER — ENOXAPARIN SODIUM 40 MG/0.4ML ~~LOC~~ SOLN
40.0000 mg | SUBCUTANEOUS | Status: DC
  Administered 2018-11-13: 40 mg via SUBCUTANEOUS
  Filled 2018-11-13: qty 0.4

## 2018-11-13 MED ORDER — NITROGLYCERIN 0.4 MG SL SUBL: 0.4000 mg | SUBLINGUAL_TABLET | SUBLINGUAL | Status: DC | PRN

## 2018-11-13 MED ORDER — CARVEDILOL 3.125 MG PO TABS: 3.1250 mg | ORAL_TABLET | Freq: Two times a day (BID) | ORAL | 6 refills | Status: DC

## 2018-11-13 MED ORDER — ONDANSETRON HCL 4 MG/2ML IJ SOLN: 4.0000 mg | Freq: Four times a day (QID) | INTRAMUSCULAR | Status: DC | PRN

## 2018-11-13 MED FILL — Lidocaine HCl Local Inj 1%: INTRAMUSCULAR | Qty: 60 | Status: AC

## 2018-11-13 NOTE — Discharge Instructions (Signed)
° °  PLEASE SIGN UP TO YOUR MY CHART ACCOUNT WHEN YOU GET HOME (if you aren't already).  IN THE CURRENT ENVIRONMENT WITH COVID-19, IN EFFORT TO REDUCE YOUR EXPOSURE WE WILL BE CONDUCTING MANY PATIENT VISITS BY EITHER VIRTUAL/VIDEO VISITS or TELEPHONE VISITS.  BEING SIGNED UP IN YOUR MY CHART ACCOUNT  WILL HELP FACILITATE THESE VISITS AND OUR COMMUNICATION WITH YOU     FOLLOW UP WITH YOUR DENTIS REGARDING TOOTH EXTRACTION AS DR. Lovena Le DISCUSSED WITH YOU.  Not to proceed with this  Until 4-6 weeks AFTER your pacemaker implant You will need to take a single dose of antibiotics 24minutes prior to the tooth extraction.  This prescription was sent in to your pharmacy.       Supplemental Discharge Instructions for  Pacemaker/Defibrillator Patients  Activity No heavy lifting or vigorous activity with your left/right arm for 6 to 8 weeks.  Do not raise your left/right arm above your head for one week.  Gradually raise your affected arm as drawn below.              11/16/2018                11/17/2018                 11/18/2018              11/19/2018    __  NO DRIVING The patient does not drive  WOUND CARE - Keep the wound area clean and dry.  Do not get this area wet for one week. No showers for one week; you may shower on 11/19/2018   . - The tape/steri-strips on your wound will fall off; do not pull them off.  No bandage is needed on the site.  DO  NOT apply any creams, oils, or ointments to the wound area. - If you notice any drainage or discharge from the wound, any swelling or bruising at the site, or you develop a fever > 101? F after you are discharged home, call the office at once.  Special Instructions - You are still able to use cellular telephones; use the ear opposite the side where you have your pacemaker/defibrillator.  Avoid carrying your cellular phone near your device. - When traveling through airports, show security personnel your identification card to avoid being screened in  the metal detectors.  Ask the security personnel to use the hand wand. - Avoid arc welding equipment, MRI testing (magnetic resonance imaging), TENS units (transcutaneous nerve stimulators).  Call the office for questions about other devices. - Avoid electrical appliances that are in poor condition or are not properly grounded. - Microwave ovens are safe to be near or to operate.

## 2018-11-13 NOTE — Discharge Summary (Signed)
DISCHARGE SUMMARY    Patient ID: Jaime [name],  MRN: [id number], DOB/AGE: 1940-11-23 78 y.o.  Admit date: 11/12/2018 Discharge date: 11/13/2018  Primary Care Physician: [name], MD  Primary Cardiologist: Dr. Meda Coffee Electrophysiologist: new, Dr. Lovena Le  Primary Discharge Diagnosis:  1. Advanced heart block 2. Symptomatic bradycardia  Secondary Discharge Diagnosis:  1. CAD 2. IDDM 3. ICM 4. PVD     H/o b/l AKA 5. AKA  Allergies  Allergen Reactions  . Bee Venom Anaphylaxis  . Beta Adrenergic Blockers Other (See Comments)    Bradycardia with Carvedilol   . Carvedilol Other (See Comments)    Bradycardia with all Beta blockers   . Influenza Vaccines Other (See Comments)    Made patient "sick for months"  . Atorvastatin Other (See Comments)    Severe pain in stumps  . Latex Rash     Procedures This Admission:  1.  Implantation of a SJM CRT-P on 11/12/2018 by Dr Lovena Le.  The patient received a Comptroller model [id number] (serial number [id number]) right atrial lead and a [facility] Medical model [id number] (serial number [id number]) right ventricular lead, St. Jude(serial number [id number]) lead (LV), St. Jude (serial number [id number]) pacemaker There were no immediate post procedure complications. 2.  CXR on 11/13/2018 demonstrated no pneumothorax status post device implantation.   Brief HPI: Jaime [name] is a 78 y.o. male with PMHx noted above, was brought to the ER via EMS with progressive weakness, observation of low BP and pulse by his wife, developing some confusion, unusual fatigue, increased sleeping.  Noted hypotensive and bradycardic.  Hospital Course:  The patient was noted hypotensive by EMS, treated with IVF with transient improvement in BP, and bradycardic. There was discussion of concerns for recurrent infection given a recent hospitalization with sepsis, though no clinical evidence of this. Had normal WBC, was afebrile, normal lactic acid.  He came  with condom catheter, without evidence of overt UTI.  On arrival a COVID test was done and negative.  He was found in Mobitz I block with marked PR prolongation and periods of 2:1 conduction, and rates into the 20's and hypotensive, baseline RBBB with LBBB escape.  He was admitted for further management.  He has known ICM, though given multiple significant comorbid conditions, not felt  Candidate for ICD, not felt to be fluid OL acutely.  No reversible causes for his bradycardia were found and he underwent implantation of a CRT-P with details as outlined above.  He was monitored on telemetry overnight which demonstrated SR/V pacing and AV pacing.  Left chest was without hematoma or ecchymosis.  The device was interrogated and found to be functioning normally.  CXR was obtained and demonstrated no pneumothorax status post device implantation.  Wound care, arm mobility, and restrictions were reviewed with the patient.  The patient feels well, much more alert, very conversational this morning.  No CP or SOB, he was examined by Dr. Lovena Le and considered stable for discharge to home.   He has HTN, now with PPM in place, will add on coreg for management of his HTN and CM to titrate outpatient.  The patient is in need of a dental extraction.  He was urged to get this done in 4-6 weeks after his PPM implant, and is recommended to use dental prophylaxis for this given new PPM.  A one time Amoxil prescription is provided.   I discussed with the patient's wife, they use a lift for transfers  with help at home.  The patient bed/chair bound with b/l AKA and severe scoliosis.  She mentions that he has had some advancement in his dementia in the last months.  She reports his dental appt is for September, we discussed getting that taken care of sooner, though not before 4 weeks from today, and discussed the dental prophylaxis as well.  Discussed with her addition of carvedilol also.  We went over wound care and LUE  restrictions.  She was very appreciative of the call and her husband's care.    Physical Exam: Vitals:   11/13/18 0040 11/13/18 0205 11/13/18 0240 11/13/18 0414  BP: (!) 169/91 (!) 172/84 (!) 168/93 (!) 178/78  Pulse: (!) 142  67 70  Resp: (!) 24 14 16 16   Temp:    98.2 F (36.8 C)  TempSrc:    Oral  SpO2: (!) 81%  98% 98%  Weight:    65.3 kg    GEN- The patient is well appearing, alert and oriented x 3 today.   HEENT: normocephalic, atraumatic; sclera clear, conjunctiva pink; hearing intact; oropharynx clear; neck supple, no JVP Lungs-  CTA b/l, normal work of breathing.  No wheezes, rales, rhonchi Heart-  RRR, no murmurs, rubs or gallops, PMI not laterally displaced GI- soft, non-tender, non-distended Extremities- no clubbing, cyanosis, or edema MS-  b/l AKA Skin- warm and dry, no rash or lesion,  left chest without hematoma/ecchymosis Psych-  euthymic mood, full affect Neuro- no gross deficits   Labs:   Lab Results  Component Value Date   WBC 5.2 11/12/2018   HGB 13.3 11/12/2018   HCT 39.0 11/12/2018   MCV 81.9 11/12/2018   PLT 128 (L) 11/12/2018    Recent Labs  Lab 11/12/18 0939  11/13/18 0830  NA 139   < > 142  K 4.0   < > 4.2  CL 107  --  111  CO2 22  --  23  BUN 24*  --  16  CREATININE 0.93   < > 0.74  CALCIUM 8.8*  --  8.9  PROT 6.8  --   --   BILITOT 0.6  --   --   ALKPHOS 46  --   --   ALT 11  --   --   AST 15  --   --   GLUCOSE 139*  --  113*   < > = values in this interval not displayed.    Discharge Medications:  Allergies as of 11/13/2018      Reactions   Bee Venom Anaphylaxis   Beta Adrenergic Blockers Other (See Comments)   Bradycardia with Carvedilol   Carvedilol Other (See Comments)   Bradycardia with all Beta blockers    Influenza Vaccines Other (See Comments)   Made patient "sick for months"   Atorvastatin Other (See Comments)   Severe pain in stumps   Latex Rash      Medication List    TAKE these medications   albuterol  108 (90 Base) MCG/ACT inhaler Commonly known as:  VENTOLIN HFA Inhale 2 puffs into the lungs every 6 (six) hours as needed for wheezing or shortness of breath.   amoxicillin 500 MG tablet Commonly known as:  AMOXIL Take 4 tablets (2,000 mg total) by mouth once for 1 dose. Take 30 minutes prior to dental extraction Notes to patient:  THIS IS A ONE TIME SINGLE DOSE TO BE TAKE PRIOR TO DENTAL EXTRACTION (to be done for 4-6 weeks as discussed)  aspirin EC 81 MG tablet Take 1 tablet (81 mg total) by mouth daily.   azelastine 0.1 % nasal spray Commonly known as:  ASTELIN Place 2 sprays into both nostrils at bedtime as needed for rhinitis. Use in each nostril as directed   baclofen 10 MG tablet Commonly known as:  LIORESAL Take 0.5 tablets (5 mg total) by mouth 2 (two) times daily.   carvedilol 3.125 MG tablet Commonly known as:  Coreg Take 1 tablet (3.125 mg total) by mouth 2 (two) times daily with a meal.   cetirizine 10 MG tablet Commonly known as:  ZYRTEC Take 10 mg by mouth daily.   diclofenac sodium 1 % Gel Commonly known as:  VOLTAREN Apply 1 application topically 2 (two) times daily as needed (FOR PAIN).   furosemide 20 MG tablet Commonly known as:  LASIX Take 1 tablet (20 mg total) by mouth daily as needed for fluid or edema (or shortness of breath).   GlucaGen 1 MG Solr injection Generic drug:  glucagon Inject 1 mg into the muscle once as needed for low blood sugar. Reported on 11/16/2015   guaiFENesin 100 MG/5ML Soln Commonly known as:  ROBITUSSIN Take 5 mLs by mouth every 4 (four) hours as needed for cough or to loosen phlegm.   insulin aspart protamine - aspart (70-30) 100 UNIT/ML FlexPen Commonly known as:  NOVOLOG 70/30 MIX Inject 0.05 mLs (5 Units total) into the skin 2 (two) times daily. What changed:    when to take this  reasons to take this   lisinopril 10 MG tablet Commonly known as:  ZESTRIL TAKE 1 TABLET BY MOUTH EVERY DAY   multivitamin with  minerals tablet Take 1 tablet by mouth daily.   nicotine 14 mg/24hr patch Commonly known as:  NICODERM CQ - dosed in mg/24 hours Place 1 patch (14 mg total) onto the skin daily.   pantoprazole 40 MG tablet Commonly known as:  PROTONIX Take 1 tablet (40 mg total) by mouth daily with lunch.   pregabalin 150 MG capsule Commonly known as:  LYRICA Take 150-300 mg by mouth See admin instructions. Take 150 mg by mouth in the morning and 300 mg at bedtime   rosuvastatin 20 MG tablet Commonly known as:  CRESTOR TAKE 1 TABLET(20 MG) BY MOUTH DAILY What changed:  See the new instructions.   saccharomyces boulardii 250 MG capsule Commonly known as:  Florastor Take 1 capsule (250 mg total) by mouth 2 (two) times daily.   SYSTANE BALANCE OP Place 1-2 drops into both eyes daily.   traMADol 50 MG tablet Commonly known as:  ULTRAM Take 1 tablet (50 mg total) by mouth every 4 (four) hours as needed. What changed:  reasons to take this   traZODone 100 MG tablet Commonly known as:  DESYREL Take 100 mg by mouth at bedtime as needed for sleep.   Vitamin D-3 25 MCG (1000 UT) Caps Take 2,000 Units by mouth daily.       Disposition:  Home  Discharge Instructions    Diet - low sodium heart healthy   Complete by:  As directed    Increase activity slowly   Complete by:  As directed      Follow-up Information    [facility].   Specialty:  Cardiology Why:  11/23/2018 @ 2:00PM, wound check visit Contact information: [address] [place]       Evans Lance, MD  Follow up.   Specialty:  Cardiology Why:  02/12/2019 @ 2:45PM Contact information: [address] [address] [phone]           Duration of Discharge Encounter: Greater than 30 minutes including physician time.  Venetia Night, PA-C 11/13/2018 10:23 AM  EP Attending  Patient seen and  examined. Agree with above. The patient is doing well after biv ppm insertion. He looks much better. His PPM interrogation under my supervision demonstrates normal biv PPM function.  He will followup as above. He will need his tooth/teeth removed.  Mikle Bosworth.D.

## 2018-11-13 NOTE — Telephone Encounter (Signed)
Copied from [place] [id number]. Topic: Quick Communication - See Telephone Encounter >> Nov 13, 2018  4:13 PM Blase Mess A wrote: CRM for notification. See Telephone encounter for: 11/13/18  Patient's wife is calling because [name] is listed as the patient's PCP. If she receives information on the patient's pacemaker can it be faxed to Dr Kristine Linea or Dr. Glean Salvo. Thank you.

## 2018-11-13 NOTE — Care Management (Signed)
1046 11-13-18 CM called to wife to verify address for PTAR to transport home. PTAR notified for pickup at 1130 am. Wife will be at the home to receive. No further needs from CM at this time. Bethena Roys, RN, BSN Case Manager [phone]

## 2018-11-14 DIAGNOSIS — Z7902 Long term (current) use of antithrombotics/antiplatelets: Secondary | ICD-10-CM | POA: Diagnosis not present

## 2018-11-14 DIAGNOSIS — I495 Sick sinus syndrome: Secondary | ICD-10-CM | POA: Diagnosis not present

## 2018-11-14 DIAGNOSIS — I509 Heart failure, unspecified: Secondary | ICD-10-CM | POA: Diagnosis not present

## 2018-11-14 DIAGNOSIS — E1165 Type 2 diabetes mellitus with hyperglycemia: Secondary | ICD-10-CM | POA: Diagnosis not present

## 2018-11-14 LAB — URINE CULTURE: Culture: 30000 — AB

## 2018-11-15 NOTE — Telephone Encounter (Signed)
Any information needed should be faxed from cardiology - not sure what his wife means/needs.

## 2018-11-16 ENCOUNTER — Inpatient Hospital Stay (HOSPITAL_COMMUNITY)
Admission: EM | Admit: 2018-11-16 | Discharge: 2018-11-19 | DRG: 193 | Disposition: A | Discharge status: Skilled Nursing Facility | Payer: Medicare Other | Attending: Internal Medicine | Admitting: Internal Medicine

## 2018-11-16 ENCOUNTER — Emergency Department (HOSPITAL_COMMUNITY): Payer: Medicare Other

## 2018-11-16 ENCOUNTER — Encounter (HOSPITAL_COMMUNITY): Payer: Self-pay | Admitting: Emergency Medicine

## 2018-11-16 ENCOUNTER — Other Ambulatory Visit: Payer: Self-pay

## 2018-11-16 ENCOUNTER — Telehealth: Payer: Self-pay | Admitting: Internal Medicine

## 2018-11-16 DIAGNOSIS — J181 Lobar pneumonia, unspecified organism: Secondary | ICD-10-CM | POA: Diagnosis present

## 2018-11-16 DIAGNOSIS — I2510 Atherosclerotic heart disease of native coronary artery without angina pectoris: Secondary | ICD-10-CM | POA: Diagnosis present

## 2018-11-16 DIAGNOSIS — F0390 Unspecified dementia without behavioral disturbance: Secondary | ICD-10-CM | POA: Diagnosis present

## 2018-11-16 DIAGNOSIS — J9601 Acute respiratory failure with hypoxia: Secondary | ICD-10-CM | POA: Diagnosis present

## 2018-11-16 DIAGNOSIS — F17290 Nicotine dependence, other tobacco product, uncomplicated: Secondary | ICD-10-CM | POA: Diagnosis present

## 2018-11-16 DIAGNOSIS — E785 Hyperlipidemia, unspecified: Secondary | ICD-10-CM | POA: Diagnosis present

## 2018-11-16 DIAGNOSIS — Z823 Family history of stroke: Secondary | ICD-10-CM

## 2018-11-16 DIAGNOSIS — I110 Hypertensive heart disease with heart failure: Secondary | ICD-10-CM | POA: Diagnosis present

## 2018-11-16 DIAGNOSIS — Z950 Presence of cardiac pacemaker: Secondary | ICD-10-CM | POA: Diagnosis not present

## 2018-11-16 DIAGNOSIS — G35 Multiple sclerosis: Secondary | ICD-10-CM | POA: Diagnosis present

## 2018-11-16 DIAGNOSIS — Z8673 Personal history of transient ischemic attack (TIA), and cerebral infarction without residual deficits: Secondary | ICD-10-CM

## 2018-11-16 DIAGNOSIS — Z8249 Family history of ischemic heart disease and other diseases of the circulatory system: Secondary | ICD-10-CM

## 2018-11-16 DIAGNOSIS — Y95 Nosocomial condition: Secondary | ICD-10-CM | POA: Diagnosis present

## 2018-11-16 DIAGNOSIS — G9341 Metabolic encephalopathy: Secondary | ICD-10-CM | POA: Diagnosis present

## 2018-11-16 DIAGNOSIS — J189 Pneumonia, unspecified organism: Secondary | ICD-10-CM | POA: Diagnosis present

## 2018-11-16 DIAGNOSIS — R4182 Altered mental status, unspecified: Secondary | ICD-10-CM

## 2018-11-16 DIAGNOSIS — Z89612 Acquired absence of left leg above knee: Secondary | ICD-10-CM

## 2018-11-16 DIAGNOSIS — R0602 Shortness of breath: Secondary | ICD-10-CM | POA: Diagnosis not present

## 2018-11-16 DIAGNOSIS — R404 Transient alteration of awareness: Secondary | ICD-10-CM | POA: Diagnosis not present

## 2018-11-16 DIAGNOSIS — Z8349 Family history of other endocrine, nutritional and metabolic diseases: Secondary | ICD-10-CM

## 2018-11-16 DIAGNOSIS — R05 Cough: Secondary | ICD-10-CM | POA: Diagnosis not present

## 2018-11-16 DIAGNOSIS — Z20828 Contact with and (suspected) exposure to other viral communicable diseases: Secondary | ICD-10-CM | POA: Diagnosis present

## 2018-11-16 DIAGNOSIS — G546 Phantom limb syndrome with pain: Secondary | ICD-10-CM | POA: Diagnosis present

## 2018-11-16 DIAGNOSIS — I739 Peripheral vascular disease, unspecified: Secondary | ICD-10-CM | POA: Diagnosis not present

## 2018-11-16 DIAGNOSIS — Z7982 Long term (current) use of aspirin: Secondary | ICD-10-CM

## 2018-11-16 DIAGNOSIS — I272 Pulmonary hypertension, unspecified: Secondary | ICD-10-CM | POA: Diagnosis present

## 2018-11-16 DIAGNOSIS — R627 Adult failure to thrive: Secondary | ICD-10-CM | POA: Diagnosis present

## 2018-11-16 DIAGNOSIS — E1169 Type 2 diabetes mellitus with other specified complication: Secondary | ICD-10-CM | POA: Diagnosis not present

## 2018-11-16 DIAGNOSIS — J9811 Atelectasis: Secondary | ICD-10-CM | POA: Diagnosis present

## 2018-11-16 DIAGNOSIS — J069 Acute upper respiratory infection, unspecified: Secondary | ICD-10-CM | POA: Diagnosis not present

## 2018-11-16 DIAGNOSIS — Z833 Family history of diabetes mellitus: Secondary | ICD-10-CM

## 2018-11-16 DIAGNOSIS — I959 Hypotension, unspecified: Secondary | ICD-10-CM | POA: Diagnosis not present

## 2018-11-16 DIAGNOSIS — I441 Atrioventricular block, second degree: Secondary | ICD-10-CM | POA: Diagnosis present

## 2018-11-16 DIAGNOSIS — Z89611 Acquired absence of right leg above knee: Secondary | ICD-10-CM | POA: Diagnosis not present

## 2018-11-16 DIAGNOSIS — K219 Gastro-esophageal reflux disease without esophagitis: Secondary | ICD-10-CM | POA: Diagnosis not present

## 2018-11-16 DIAGNOSIS — I5042 Chronic combined systolic (congestive) and diastolic (congestive) heart failure: Secondary | ICD-10-CM | POA: Diagnosis present

## 2018-11-16 DIAGNOSIS — I10 Essential (primary) hypertension: Secondary | ICD-10-CM | POA: Diagnosis not present

## 2018-11-16 DIAGNOSIS — Z743 Need for continuous supervision: Secondary | ICD-10-CM | POA: Diagnosis not present

## 2018-11-16 DIAGNOSIS — E1151 Type 2 diabetes mellitus with diabetic peripheral angiopathy without gangrene: Secondary | ICD-10-CM | POA: Diagnosis present

## 2018-11-16 DIAGNOSIS — N390 Urinary tract infection, site not specified: Secondary | ICD-10-CM | POA: Diagnosis present

## 2018-11-16 DIAGNOSIS — R52 Pain, unspecified: Secondary | ICD-10-CM | POA: Diagnosis not present

## 2018-11-16 DIAGNOSIS — M6281 Muscle weakness (generalized): Secondary | ICD-10-CM | POA: Diagnosis not present

## 2018-11-16 DIAGNOSIS — I252 Old myocardial infarction: Secondary | ICD-10-CM

## 2018-11-16 DIAGNOSIS — G3184 Mild cognitive impairment, so stated: Secondary | ICD-10-CM | POA: Diagnosis not present

## 2018-11-16 DIAGNOSIS — Z794 Long term (current) use of insulin: Secondary | ICD-10-CM

## 2018-11-16 DIAGNOSIS — G8220 Paraplegia, unspecified: Secondary | ICD-10-CM | POA: Diagnosis present

## 2018-11-16 DIAGNOSIS — D649 Anemia, unspecified: Secondary | ICD-10-CM | POA: Diagnosis present

## 2018-11-16 DIAGNOSIS — I255 Ischemic cardiomyopathy: Secondary | ICD-10-CM | POA: Diagnosis present

## 2018-11-16 DIAGNOSIS — R279 Unspecified lack of coordination: Secondary | ICD-10-CM | POA: Diagnosis not present

## 2018-11-16 DIAGNOSIS — Z825 Family history of asthma and other chronic lower respiratory diseases: Secondary | ICD-10-CM

## 2018-11-16 DIAGNOSIS — I5022 Chronic systolic (congestive) heart failure: Secondary | ICD-10-CM | POA: Diagnosis not present

## 2018-11-16 DIAGNOSIS — B961 Klebsiella pneumoniae [K. pneumoniae] as the cause of diseases classified elsewhere: Secondary | ICD-10-CM | POA: Diagnosis present

## 2018-11-16 DIAGNOSIS — R531 Weakness: Secondary | ICD-10-CM | POA: Diagnosis not present

## 2018-11-16 DIAGNOSIS — Z03818 Encounter for observation for suspected exposure to other biological agents ruled out: Secondary | ICD-10-CM | POA: Diagnosis not present

## 2018-11-16 LAB — COMPREHENSIVE METABOLIC PANEL
ALT: 12 U/L (ref 0–44)
AST: 19 U/L (ref 15–41)
Albumin: 2.8 g/dL — ABNORMAL LOW (ref 3.5–5.0)
Alkaline Phosphatase: 43 U/L (ref 38–126)
Anion gap: 7 (ref 5–15)
BUN: 27 mg/dL — ABNORMAL HIGH (ref 8–23)
CO2: 24 mmol/L (ref 22–32)
Calcium: 8.5 mg/dL — ABNORMAL LOW (ref 8.9–10.3)
Chloride: 106 mmol/L (ref 98–111)
Creatinine, Ser: 0.7 mg/dL (ref 0.61–1.24)
GFR calc Af Amer: >60 mL/min (ref >60)
GFR calc non Af Amer: >60 mL/min (ref >60)
Glucose, Bld: 121 mg/dL — ABNORMAL HIGH (ref 70–99)
Potassium: 4.3 mmol/L (ref 3.5–5.1)
Sodium: 137 mmol/L (ref 135–145)
Total Bilirubin: 0.7 mg/dL (ref 0.3–1.2)
Total Protein: 6.2 g/dL — ABNORMAL LOW (ref 6.5–8.1)

## 2018-11-16 LAB — URINALYSIS, ROUTINE W REFLEX MICROSCOPIC
Bilirubin Urine: NEGATIVE
Glucose, UA: NEGATIVE mg/dL
Hgb urine dipstick: NEGATIVE
Ketones, ur: NEGATIVE mg/dL
Nitrite: NEGATIVE
Protein, ur: NEGATIVE mg/dL
Specific Gravity, Urine: 1.018 (ref 1.005–1.030)
pH: 5 (ref 5.0–8.0)

## 2018-11-16 LAB — CBC WITH DIFFERENTIAL/PLATELET
Abs Immature Granulocytes: 0.01 10*3/uL (ref 0.00–0.07)
Basophils Absolute: 0 10*3/uL (ref 0.0–0.1)
Basophils Relative: 0 %
Eosinophils Absolute: 0.3 10*3/uL (ref 0.0–0.5)
Eosinophils Relative: 4 %
HCT: 34.3 % — ABNORMAL LOW (ref 39.0–52.0)
Hemoglobin: 10.7 g/dL — ABNORMAL LOW (ref 13.0–17.0)
Immature Granulocytes: 0 %
Lymphocytes Relative: 30 %
Lymphs Abs: 1.8 10*3/uL (ref 0.7–4.0)
MCH: 26.2 pg (ref 26.0–34.0)
MCHC: 31.2 g/dL (ref 30.0–36.0)
MCV: 83.9 fL (ref 80.0–100.0)
Monocytes Absolute: 0.7 10*3/uL (ref 0.1–1.0)
Monocytes Relative: 11 %
Neutro Abs: 3.3 10*3/uL (ref 1.7–7.7)
Neutrophils Relative %: 55 %
Platelets: 120 10*3/uL — ABNORMAL LOW (ref 150–400)
RBC: 4.09 MIL/uL — ABNORMAL LOW (ref 4.22–5.81)
RDW: 17 % — ABNORMAL HIGH (ref 11.5–15.5)
WBC: 6.1 10*3/uL (ref 4.0–10.5)
nRBC: 0 % (ref 0.0–0.2)

## 2018-11-16 LAB — TSH: TSH: 0.581 u[IU]/mL (ref 0.350–4.500)

## 2018-11-16 LAB — BRAIN NATRIURETIC PEPTIDE: B Natriuretic Peptide: 110.8 pg/mL — ABNORMAL HIGH (ref 0.0–100.0)

## 2018-11-16 LAB — FOLATE: Folate: 12.4 ng/mL (ref >5.9)

## 2018-11-16 LAB — T4, FREE: Free T4: 0.93 ng/dL (ref 0.82–1.77)

## 2018-11-16 LAB — LACTIC ACID, PLASMA: Lactic Acid, Venous: 0.6 mmol/L (ref 0.5–1.9)

## 2018-11-16 LAB — GLUCOSE, CAPILLARY: Glucose-Capillary: 104 mg/dL — ABNORMAL HIGH (ref 70–99)

## 2018-11-16 LAB — PROCALCITONIN: Procalcitonin: <0.1 ng/mL

## 2018-11-16 LAB — SARS CORONAVIRUS 2 BY RT PCR (HOSPITAL ORDER, PERFORMED IN ~~LOC~~ HOSPITAL LAB): SARS Coronavirus 2: NEGATIVE

## 2018-11-16 LAB — AMMONIA: Ammonia: 24 umol/L (ref 9–35)

## 2018-11-16 LAB — VITAMIN B12: Vitamin B-12: 484 pg/mL (ref 180–914)

## 2018-11-16 MED ORDER — PREGABALIN 100 MG PO CAPS
300.0000 mg | ORAL_CAPSULE | Freq: Every day | ORAL | Status: DC
  Administered 2018-11-16 – 2018-11-18 (×3): 300 mg via ORAL
  Filled 2018-11-16 (×3): qty 3

## 2018-11-16 MED ORDER — PREGABALIN 75 MG PO CAPS
150.0000 mg | ORAL_CAPSULE | Freq: Every morning | ORAL | Status: DC
  Administered 2018-11-17 – 2018-11-19 (×3): 150 mg via ORAL
  Filled 2018-11-16 (×3): qty 2

## 2018-11-16 MED ORDER — SODIUM CHLORIDE 0.9 % IV BOLUS
500.0000 mL | Freq: Once | INTRAVENOUS | Status: AC
  Administered 2018-11-16: 12:00:00 500 mL via INTRAVENOUS

## 2018-11-16 MED ORDER — SODIUM CHLORIDE 0.9 % IV SOLN
1.0000 g | Freq: Once | INTRAVENOUS | Status: AC
  Administered 2018-11-16: 1 g via INTRAVENOUS
  Filled 2018-11-16: qty 10

## 2018-11-16 MED ORDER — SODIUM CHLORIDE 0.9 % IV SOLN
1.0000 g | INTRAVENOUS | Status: DC
  Administered 2018-11-17 – 2018-11-18 (×2): 1 g via INTRAVENOUS
  Filled 2018-11-16 (×2): qty 10

## 2018-11-16 MED ORDER — INSULIN ASPART 100 UNIT/ML ~~LOC~~ SOLN: 0.0000 [IU] | Freq: Every day | SUBCUTANEOUS | Status: DC

## 2018-11-16 MED ORDER — SODIUM CHLORIDE 0.9 % IV SOLN
500.0000 mg | Freq: Once | INTRAVENOUS | Status: DC
  Filled 2018-11-16: qty 500

## 2018-11-16 MED ORDER — ENOXAPARIN SODIUM 40 MG/0.4ML ~~LOC~~ SOLN
40.0000 mg | SUBCUTANEOUS | Status: DC
  Administered 2018-11-16 – 2018-11-18 (×3): 40 mg via SUBCUTANEOUS
  Filled 2018-11-16 (×3): qty 0.4

## 2018-11-16 MED ORDER — INSULIN ASPART 100 UNIT/ML ~~LOC~~ SOLN
0.0000 [IU] | Freq: Three times a day (TID) | SUBCUTANEOUS | Status: DC
  Administered 2018-11-17: 1 [IU] via SUBCUTANEOUS
  Administered 2018-11-17 – 2018-11-19 (×4): 2 [IU] via SUBCUTANEOUS

## 2018-11-16 MED ORDER — ROSUVASTATIN CALCIUM 20 MG PO TABS
20.0000 mg | ORAL_TABLET | Freq: Every day | ORAL | Status: DC
  Administered 2018-11-17 – 2018-11-19 (×3): 20 mg via ORAL
  Filled 2018-11-16 (×3): qty 1

## 2018-11-16 MED ORDER — PANTOPRAZOLE SODIUM 40 MG PO TBEC
40.0000 mg | DELAYED_RELEASE_TABLET | Freq: Every day | ORAL | Status: DC
  Administered 2018-11-17 – 2018-11-19 (×3): 40 mg via ORAL
  Filled 2018-11-16 (×3): qty 1

## 2018-11-16 MED ORDER — ALBUTEROL SULFATE (2.5 MG/3ML) 0.083% IN NEBU: 2.5000 mg | INHALATION_SOLUTION | Freq: Four times a day (QID) | RESPIRATORY_TRACT | Status: DC | PRN

## 2018-11-16 MED ORDER — VANCOMYCIN HCL 500 MG IV SOLR
500.0000 mg | Freq: Two times a day (BID) | INTRAVENOUS | Status: DC
  Administered 2018-11-17 – 2018-11-18 (×3): 500 mg via INTRAVENOUS
  Filled 2018-11-16 (×8): qty 500

## 2018-11-16 MED ORDER — PREGABALIN 150 MG PO CAPS: 150.0000 mg | ORAL_CAPSULE | ORAL | Status: DC

## 2018-11-16 MED ORDER — VANCOMYCIN HCL 10 G IV SOLR
1250.0000 mg | Freq: Once | INTRAVENOUS | Status: AC
  Administered 2018-11-16: 1250 mg via INTRAVENOUS
  Filled 2018-11-16: qty 1250

## 2018-11-16 MED ORDER — CARVEDILOL 3.125 MG PO TABS: 3.1250 mg | ORAL_TABLET | Freq: Two times a day (BID) | ORAL | Status: DC

## 2018-11-16 MED ORDER — SODIUM CHLORIDE 0.9 % IV SOLN
500.0000 mg | INTRAVENOUS | Status: DC
  Administered 2018-11-16 – 2018-11-18 (×3): 500 mg via INTRAVENOUS
  Filled 2018-11-16 (×4): qty 500

## 2018-11-16 MED ORDER — TRAMADOL HCL 50 MG PO TABS
50.0000 mg | ORAL_TABLET | Freq: Two times a day (BID) | ORAL | Status: DC | PRN
  Administered 2018-11-17: 50 mg via ORAL
  Filled 2018-11-16: qty 1

## 2018-11-16 MED ORDER — FUROSEMIDE 20 MG PO TABS: 20.0000 mg | ORAL_TABLET | Freq: Every day | ORAL | Status: DC

## 2018-11-16 MED ORDER — LISINOPRIL 10 MG PO TABS: 10.0000 mg | ORAL_TABLET | Freq: Every day | ORAL | Status: DC

## 2018-11-16 MED ORDER — ASPIRIN EC 81 MG PO TBEC
81.0000 mg | DELAYED_RELEASE_TABLET | Freq: Every day | ORAL | Status: DC
  Administered 2018-11-17 – 2018-11-19 (×3): 81 mg via ORAL
  Filled 2018-11-16 (×3): qty 1

## 2018-11-16 NOTE — Telephone Encounter (Signed)
Dr. Darlina Sicilian.

## 2018-11-16 NOTE — TOC Initial Note (Signed)
Transition of Care [facility]) - Initial/Assessment Note    Patient Details  Name: Jaime [name] MRN: [id number] Date of Birth: [birth year]  Transition of [facility] CM/SW Contact:    Erenest Rasher, RN Phone Number: 11/16/2018, 4:11 PM  Clinical Narrative:                  Spoke to pt's wife, Suede Greenawalt. States she is interested in him going to SNF rehab. States was able to feed himself but since his surgery he is having difficulty feeding himself. Spoke to attending and pt will scheduled for admission. Unit CM/CSW will continue to follow for SNF rehab. Will need PT/OT recommendations. Wife states pt has an aide that comes in am through his [employer] benefits but states aide is not helpful.    Expected Discharge Plan: Skilled Nursing Facility Barriers to Discharge: Continued Medical Work up   Patient Goals and CMS Choice Patient states their goals for this hospitalization and ongoing recovery are:: Get stronger and return home CMS Medicare.gov Compare Post Acute Care list provided to:: Patient Represenative (must comment)([name] Mertens) Choice offered to / list presented to : Spouse  Expected Discharge Plan and Services Expected Discharge Plan: [place] In-house Referral: Clinical Social Work Discharge Planning Services: CM Consult Post Acute Care Choice: Home Health, Nursing Home Living arrangements for the past 2 months: [place]                                      Prior Living Arrangements/Services Living arrangements for the past 2 months: [place] Lives with:: Spouse Patient language and need for interpreter reviewed:: Yes Do you feel safe going back to the place where you live?: Yes      Need for Family Participation in Patient Care: Yes (Comment) Care giver support system in place?: Yes (comment) Current home services: DME(hoyer lift (manual), power wheelchair, hospital bed) Criminal Activity/Legal  Involvement Pertinent to Current Situation/Hospitalization: No - Comment as needed  Activities of Daily Living      Permission Sought/Granted Permission sought to share information with : Case Manager, Family Supports, PCP Permission granted to share information with : Yes, Verbal Permission Granted  Share Information with NAME: Hollis Tuller     Permission granted to share info w Relationship: Spouse  Permission granted to share info w Contact Information: [phone]  Emotional Assessment     Affect (typically observed): Accepting, Calm, Appropriate, Pleasant, Hopeful Orientation: : Oriented to Situation, Oriented to Self, Oriented to Place Alcohol / Substance Use: Not Applicable Psych Involvement: No (comment)  Admission diagnosis:  weakness Patient Active Problem List   Diagnosis Date Noted  . Second degree Mobitz I AV block 11/12/2018  . Abnormal liver function   . Pneumonia 03/27/2018  . UTI due to Klebsiella species 11/24/2017  . Hypoglycemia   . Gram-negative bacteremia   . Cool skin 07/18/2017  . Hematuria 04/29/2017  . GERD (gastroesophageal reflux disease) 04/10/2016  . Coronary artery disease involving native coronary artery of native heart without angina pectoris 12/28/2015  . PAD (peripheral artery disease) ([place]) 12/28/2015  . Symptomatic bradycardia 08/14/2015  . Chronic systolic heart failure ([place]) 08/14/2015  . Phantom limb pain ([place]) 12/08/2014  . Syncope 11/01/2014  . Severe sepsis with septic shock ([place]) 08/18/2014  . Acute lower UTI 08/18/2014  . Elevated troponin 08/18/2014  . 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  Paraparesis of both lower limbs ([place]) 05/31/2014  . Scoliosis, thoracogenic, acquired 03/15/2014  . Mild cognitive impairment 02/11/2014  . PVD (peripheral vascular disease) ([place]) 08/13/2013  . S/P AKA (above knee amputation) bilateral ([place]) 08/03/2012  . PAC (premature atrial contraction)   . Peripheral  vascular disease, unspecified ([place]) 07/03/2012  . 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 Ejection fraction   . Cardiomyopathy, ischemic   . Pulmonary hypertension ([place])   . Hemiparesis ([place])   . Essential hypertension   . Carotid artery disease ([place])   . Hyperlipidemia 07/31/2007  . BPH (benign prostatic hyperplasia) 04/15/2007  . DMII (diabetes mellitus, type 2) ([place]) 12/18/2006   PCP:  [name], MD Pharmacy:   [facility] [place], [place] [address] Phone: [phone] Fax: [phone]     Social Determinants of Health (SDOH) Interventions    Readmission Risk Interventions No flowsheet data found.

## 2018-11-16 NOTE — ED Notes (Signed)
Nurse Navigator: Upon rounding, the patient asked me to contact his wife. Mrs. Dowson was contacted and given an update. She expresses that a Dr. Has given her a call and she wants [place] to be informed that the patient has been "seeing things more", he told the writer that his brother was also a patient in the hospital; this is not true. She also says that the patient has had difficulty with meals due to MS affecting one side and the procedure affecting the other. This information will be passed along to the primary RN. Mrs. Trimarco was very appreciative of the call.

## 2018-11-16 NOTE — ED Triage Notes (Signed)
Patient arrived via EMS complaints of increased weakness for 2 days. Family called EMS and report decline in alertness  Reports change in appetites. Recently admitted for hypotension and bradycardia and pacemaker place. Pain reported to left side of body up to left head. Denies shortness or breath. Reports new chest congestion without runny nose. Denies chest pain. Eyes pinpoint at this time.

## 2018-11-16 NOTE — ED Notes (Signed)
ED Provider at bedside. Jaime [name]

## 2018-11-16 NOTE — ED Notes (Signed)
Dressing applied to pacemaker site per request of provider. Jake Bathe, MEd, RN CEN 11/16/2018 10:09 AM

## 2018-11-16 NOTE — Evaluation (Signed)
Physical Therapy Evaluation Patient Details Name: Jaime [name] MRN: [id number] DOB: Nov 23, 1940 Today's Date: 11/16/2018   History of Present Illness  Pt brought to ED with weakness and AMS. Pt Covid negative. Possible PNA. Pt with pacemaker placement on 4/23 due to bradycardia. PMH - bil AKA, multiple sclerosis, dementia, PVD, CAD, DM,   Clinical Impression  Pt presents to PT dependent in all mobility. Per notes from prior admissions pt has been nonambulatory and using a mechanical lift since 2013. During this time wife has been caring for patient at home. Since surgery last week per chart wife reports pt has been weaker and she has been unable to manage his care. Attempted to call wife to further assess details of pt's function but no answer on cell number. Recommend SNF to see if pt can regain enough strength to give the assistance his wife will need to continue to manage him at home.     Follow Up Recommendations SNF    Equipment Recommendations  None recommended by PT    Recommendations for Other Services       Precautions / Restrictions Precautions Precautions: Fall      Mobility  Bed Mobility Overal bed mobility: Needs Assistance Bed Mobility: Rolling;Supine to Sit Rolling: Total assist   Supine to sit: Total assist     General bed mobility comments: Attempted to bring trunk forward from back to stretcher for long sit but unable to achieve with one person. Rolling pt able to assist minimally with RUE. Restricted use of LUE due to recent pacer.  Transfers                    Ambulation/Gait                Stairs            Wheelchair Mobility    Modified Rankin (Stroke Patients Only)       Balance Overall balance assessment: Needs assistance     Sitting balance - Comments: Unable to get pt in unsupported position with one person assist.                                     Pertinent Vitals/Pain Pain Assessment:  No/denies pain    Home Living Family/patient expects to be discharged to:: Private residence Living Arrangements: Spouse/significant other Available Help at Discharge: Family;Available 24 hours/day Type of Home: House Home Access: Ramped entrance     Home Layout: One level Home Equipment: Wheelchair - power;Hospital bed;Other (comment) Additional Comments: hoyer lift    Prior Function Level of Independence: Needs assistance   Gait / Transfers Assistance Needed: dependent with use of hoyer lift     Comments: Information from prior encounters     Hand Dominance   Dominant Hand: Right    Extremity/Trunk Assessment   Upper Extremity Assessment Upper Extremity Assessment: Generalized weakness;LUE deficits/detail LUE Deficits / Details: Pt actively moving but with recent pacer placement I limited his use of it.    Lower Extremity Assessment Lower Extremity Assessment: RLE deficits/detail;LLE deficits/detail RLE Deficits / Details: No active movement noted. AKA LLE Deficits / Details: Grossly 2/5. AKA       Communication   Communication: No difficulties  Cognition Arousal/Alertness: Awake/alert Behavior During Therapy: WFL for tasks assessed/performed Overall Cognitive Status: No family/caregiver present to determine baseline cognitive functioning Area of Impairment: Memory;Orientation;Safety/judgement  Orientation Level: Disoriented to;Time   Memory: Decreased short-term memory   Safety/Judgement: Decreased awareness of deficits     General Comments: Pt told me he didn't use a power w/c or a mechanical lift - documentation from last seven years contradicts this      General Comments      Exercises     Assessment/Plan    PT Assessment All further PT needs can be met in the next venue of care  PT Problem List Decreased strength;Decreased balance;Decreased mobility       PT Treatment Interventions      PT Goals (Current goals can  be found in the Care Plan section)       Frequency     Barriers to discharge        Co-evaluation               AM-PAC PT "6 Clicks" Mobility  Outcome Measure Help needed turning from your back to your side while in a flat bed without using bedrails?: Total Help needed moving from lying on your back to sitting on the side of a flat bed without using bedrails?: Total Help needed moving to and from a bed to a chair (including a wheelchair)?: Total Help needed standing up from a chair using your arms (e.g., wheelchair or bedside chair)?: Total Help needed to walk in hospital room?: Total Help needed climbing 3-5 steps with a railing? : Total 6 Click Score: 6    End of Session   Activity Tolerance: Patient limited by fatigue Patient left: in bed;with call bell/phone within reach   PT Visit Diagnosis: Other abnormalities of gait and mobility (R26.89)    Time: 8485-9276 PT Time Calculation (min) (ACUTE ONLY): 14 min   Charges:   PT Evaluation $PT Eval Low Complexity: [place] Pager [phone] Office [place] 11/16/2018, 4:41 PM

## 2018-11-16 NOTE — ED Notes (Signed)
Nurse Navigator attempted communication with the patients wife for update. Call went to vm.

## 2018-11-16 NOTE — Telephone Encounter (Signed)
New Message:        Wife just called and wanted you to know pt is on his way to [facility] ER by EMS.

## 2018-11-16 NOTE — Telephone Encounter (Signed)
Pt to ED via EMS for 2 day history of increased weakness and altered mental status.  Recent pacer placement (4/24).

## 2018-11-16 NOTE — TOC Initial Note (Signed)
Transition of Care [facility]) - Initial/Assessment Note    Patient Details  Name: Jaime [name] MRN: [id number] Date of Birth: March 11, 1941  Transition of Care [facility]) CM/SW Contact:    [name] Endo, LCSW Phone Number: 11/16/2018, 12:01 PM  Clinical Narrative:  CSW received consult for patient due to him likely needing SNF at discharge. CSW reviewed chart, patient is oriented but with altered mental status so CSW contacted patient's wife to complete assessment. CSW spoke with [name], patient's wife in regards to the discharge plan. [name] stated that patient was weak prior to having the pacemaker implanted and that it has gotten significantly worse. [name] reports that patient was unable to feed himself all weekend due to right side weakness. [name] reports that patient has muscular sclerosis which she states causes his right side deficit. [name] reports that she is unable to care for the patient in his current condition and desires a SNF at discharge if he does not get significantly stronger. [name] reports that the patient was receiving home visits through [facility]. [name] reports that the patient has never been to a facility before and desires his placement to be in [facility]. The physical address of the patient's home is [place] in [place] so placement close to there is preferred.  CSW will continue to monitor chart and follow notes to make appropriate decisions for discharge planning. CSW will keep patient's wife informed if any new details surface.                 Expected Discharge Plan: Skilled Nursing Facility Barriers to Discharge: Continued Medical Work up   Patient Goals and CMS Choice Patient states their goals for this hospitalization and ongoing recovery are:: Get stronger and return home CMS Medicare.gov Compare Post Acute Care list provided to:: Patient Represenative (must comment)(Pending recommendations, will give to  wife) Choice offered to / list presented to : Spouse  Expected Discharge Plan and Services Expected Discharge Plan: [place]                                              Prior Living Arrangements/Services     Patient language and need for interpreter reviewed:: No Do you feel safe going back to the place where you live?: Yes      Need for Family Participation in Patient Care: Yes (Comment) Care giver support system in place?: Yes (comment)   Criminal Activity/Legal Involvement Pertinent to Current Situation/Hospitalization: No - Comment as needed  Activities of Daily Living      Permission Sought/Granted Permission sought to share information with : Family Supports Permission granted to share information with : Yes, Verbal Permission Granted  Share Information with NAME: Georgi Navarrete     Permission granted to share info w Relationship: Spouse  Permission granted to share info w Contact Information: [phone]  Emotional Assessment     Affect (typically observed): Accepting, Calm, Appropriate, Pleasant, Hopeful Orientation: : Oriented to Situation, Oriented to Self, Oriented to Place Alcohol / Substance Use: Not Applicable Psych Involvement: No (comment)  Admission diagnosis:  weakness Patient Active Problem List   Diagnosis Date Noted  . Second degree [place] I AV block 11/12/2018  . Abnormal liver function   . Pneumonia 03/27/2018  . UTI due to Klebsiella species 11/24/2017  . Hypoglycemia   . Gram-negative bacteremia   .  Cool skin 07/18/2017  . Hematuria 04/29/2017  . GERD (gastroesophageal reflux disease) 04/10/2016  . Coronary artery disease involving native coronary artery of native heart without angina pectoris 12/28/2015  . PAD (peripheral artery disease) ([place]) 12/28/2015  . Symptomatic bradycardia 08/14/2015  . Chronic systolic heart failure ([place]) 08/14/2015  . Phantom limb pain ([place]) 12/08/2014  . Syncope 11/01/2014  .  Severe sepsis with septic shock ([place]) 08/18/2014  . Acute lower UTI 08/18/2014  . Elevated troponin 08/18/2014  . 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 Paraparesis of both lower limbs ([place]) 05/31/2014  . Scoliosis, thoracogenic, acquired 03/15/2014  . Mild cognitive impairment 02/11/2014  . PVD (peripheral vascular disease) ([place]) 08/13/2013  . 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 PAC (premature atrial contraction)   . Peripheral vascular disease, unspecified ([place]) 07/03/2012  . 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 Ejection fraction   . Cardiomyopathy, ischemic   . Pulmonary hypertension ([place])   . Hemiparesis ([place])   . Essential hypertension   . Carotid artery disease ([place])   . Hyperlipidemia 07/31/2007  . BPH (benign prostatic hyperplasia) 04/15/2007  . DMII (diabetes mellitus, type 2) ([place]) 12/18/2006   PCP:  [name], MD Pharmacy:   [facility] [place], [address] [place] [address] Phone: [phone] Fax: [phone]     Social Determinants of Health (SDOH) Interventions    Readmission Risk Interventions No flowsheet data found.   Madilyn Fireman, MSW, [place] Clinical Social Worker Emergency Department [employer] [phone]

## 2018-11-16 NOTE — Progress Notes (Signed)
Pharmacy Antibiotic Note  Jaime [name] is a 78 y.o. male admitted on 11/16/2018 with pneumonia.  Pharmacy has been consulted for vancomycin dosing. Also on ceftriaxone/azithromycin per MD. COVID neg. Afebrile, WBC wnl. SCr 0.7 on admit. Noted hx of spastic paraplegia due to MS and hx bilateral AKA. SCr 0.7 on admit (baseline ~0.5-0.8).  Plan: Vancomycin 1250mg  IV x 1; then 500 mg IV Q 12 hrs Ceftriaxone 1g IV q24h per MD Azithromycin 500mg  IV q24h per MD Monitor clinical progress, c/s, renal function F/u de-escalation plan/LOT, vancomycin trough at steady state in paraplegic patient   Weight: 143 lb 15.4 oz (65.3 kg)  Temp (24hrs), Avg:98.7 F (37.1 C), Min:98.1 F (36.7 C), Max:99.2 F (37.3 C)  Recent Labs  Lab 11/12/18 0939 11/12/18 1011 11/12/18 1847 11/13/18 0830 11/16/18 0958  WBC 5.2  --   --   --  6.1  CREATININE 0.93 0.80  --  0.74 0.70  LATICACIDVEN 0.9  --  1.9  --  0.6    CrCl cannot be calculated (Unknown ideal weight.).    Allergies  Allergen Reactions  . Bee Venom Anaphylaxis  . Influenza Vaccines Other (See Comments)    Made patient "sick for months"  . Atorvastatin Other (See Comments)    Severe pain in stumps  . Latex Rash   Elicia Lamp, PharmD, BCPS Please check AMION for all [place] contact numbers Clinical Pharmacist 11/16/2018 7:10 PM

## 2018-11-16 NOTE — Care Management (Signed)
[facility] ED TOC CM -referral SNF   Chart reviewed. Contacted [facility] ED TOC CSW to follow up on SNF placement. Jonnie Finner RN CCM Case Mgmt phone [phone]

## 2018-11-16 NOTE — ED Notes (Signed)
Pt had Covid nasopharyngeal swab ordered at 0900 today, charted as collected, however had not been changed to "in process" or resulted. RN from am shift reported off to this RN that she sent off the swab. Upon further assessment, found swab with pt label on a tablet outside of the room underneath a box of gloves. Swab sent.

## 2018-11-16 NOTE — H&P (Signed)
[facility] History and Physical   Patient: Jaime [name] JQZ:[id number]   PCP: [name], MD DOB: [birth year]   DOA: 11/16/2018   DOS: 11/16/2018   DOS: the patient was seen and examined on 11/16/2018  Patient coming from: The patient is coming from home.  Chief Complaint: Confusion and generalized weakness  HPI: GABOR LUSK is a 78 y.o. 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 Patient presents to the hospital with complaints of generalized weakness and deconditioning.  Patient was recently seen in the hospital on 11/12/2018 for elective pacemaker implant for second-degree heart block.  The procedure went well patient was discharged on oral Coreg. After the discharge the patient remained weak and tired.  His pain was well controlled with tramadol which is his chronic medication but he has recently increased the use of secondary to the pacemaker procedure. Patient reported complaints of bilateral shoulder pain.  Progressively patient becoming more more weak and tired requiring significant assistance from wife for movement. 11/15/2018 patient was more confused and lethargic throughout the day.  Had minimal oral intake.  On the morning of 11/16/2018 patient was unresponsive and not following any commands at which time wife called EMS. At the time of my evaluation patient continues to complain of left shoulder pain, thought that he was in the bar, was able to tell me his name but not his date of birth.  Denies any nausea vomiting abdominal pain.  No chest pain other than the pain at the site of the pacemaker insertion.  No headache no dizziness no lightheadedness.  Did have some shortness of breath but no cough during the interview  ED Course: On arrival patient had cough, hypoxic to 87% on room air hypotensive.  Chest x-ray showed atelectasis versus  infiltrate.  With patient reading some of sirs criteria and cough and shortness of breath COVID-19 testing was performed which was negative.  Patient was referred for admission for further treatment of his pneumonia.  At his baseline ambulates with assistance And is dependent for most of his ADL; does not manages his medication on his own.  Review of Systems: as mentioned in the history of present illness.  All other systems reviewed and are negative.  Past Medical History:  Diagnosis Date  . Atherosclerotic PVD with ulceration ([place])    left foot  . Bacteremia   . CAD (coronary artery disease) 2007   Dr Meda Coffee -  non-STEMI, .occluded circumflex.. Taxus stent placed...residual 80% LAD...50% RCA  . Cardiomyopathy, ischemic    EF 45% per ECHO 2008  //   EF 25%, echo, August, 2013 //  Echo (8/15):  Mild LVH, EF 30-35%, ant-lat and lat AK, inf HK, Gr 1 DD, mild MR, mild LAE  . Carotid artery disease ([place])    a.  Doppler, February, 2012, 0-39% bilateral,Mild smooth plaque;  b.  Carotid US (8/15):  Bilateral 1-39% ICA >>> F/u 2 years  . Constipation   . Dementia ([place])   . Diabetes mellitus    INSULIN-DEPENDent  . Fatigue SEVERE  . H/O pleural effusion 2008   POST THORACENTESIS  . History of colon polyps PRECANCEROUS  . Hyperlipidemia   . Hypertension   . Impotence   . Increased prostate specific antigen (PSA) velocity   . Insomnia   . Lung nodule    resolved 11-2006 CT Chest  . Multiple  sclerosis ([place]) [place] -- LAST VISIT 11-20-2010  NOTE W/ CHART  . PAC (premature atrial contraction)    December, 2013  . Pulmonary hypertension ([place])    moderate ECHO Jan 2008  . Scoliosis associated with other condition   . Second degree Mobitz I AV block 11/12/2018  . Sleep apnea   . Symptomatic bradycardia 11/13/2018  . Systolic heart failure   . TIA (transient ischemic attack)   . Tobacco abuse    quit   . Urinary retention    dx ~ 2-12, like from [place], now with  a catheter, saw urology  . Urinary tract infection    hx of   Past Surgical History:  Procedure Laterality Date  . ABDOMINAL ANGIOGRAM  12/17/2011   Procedure: ABDOMINAL ANGIOGRAM;  Surgeon: Serafina Mitchell, MD;  Location: [facility] CATH LAB;  Service: Cardiovascular;;  . ABDOMINAL AORTAGRAM N/A 08/19/2013   Procedure: ABDOMINAL Maxcine Ham;  Surgeon: Conrad Gypsum, MD;  Location: [facility] CATH LAB;  Service: Cardiovascular;  Laterality: N/A;  . AMPUTATION  03/17/2012   Procedure: AMPUTATION ABOVE KNEE;  Surgeon: Conrad Solis, MD;  Location: [place];  Service: Vascular;  Laterality: Left;  . AMPUTATION  06/03/2012   Procedure: AMPUTATION ABOVE KNEE;  Surgeon: Conrad Hardy, MD;  Location: [place];  Service: Vascular;  Laterality: Right;  . BIV PACEMAKER INSERTION CRT-P N/A 11/12/2018   Procedure: BIV PACEMAKER INSERTION CRT-P;  Surgeon: Evans Lance, MD;  Location: [place] CV LAB;  Service: Cardiovascular;  Laterality: N/A;  . CHOLECYSTECTOMY N/A 10/25/2014   Procedure: LAPAROSCOPIC CHOLECYSTECTOMY ;  Surgeon: Coralie Keens, MD;  Location: [place];  Service: General;  Laterality: N/A;  . CORONARY ANGIOPLASTY WITH STENT PLACEMENT  05-01-2006   OCCLUDED CIRCUMFLEX -- TAXUS STENT PLACMENT  AND RESIDUAL 80% LAD,  50% RCA  . CYSTOSCOPY  07/30/2011   Procedure: CYSTOSCOPY;  Surgeon: Hanley Ben, MD;  Location: [facility];  Service: Urology;  Laterality: N/A;  . ERCP N/A 08/25/2014   Procedure: ENDOSCOPIC RETROGRADE CHOLANGIOPANCREATOGRAPHY (ERCP);  Surgeon: Ladene Artist, MD;  Location[facility] ENDOSCOPY;  Service: Endoscopy;  Laterality: N/A;  . FEMORAL-POPLITEAL BYPASS GRAFT  03/11/2012   Procedure: BYPASS GRAFT FEMORAL-POPLITEAL ARTERY;  Surgeon: Conrad Oskaloosa, MD;  Location: [place];  Service: Vascular;  Laterality: Left;  embolectomy left lower leg  . FEMORAL-TIBIAL BYPASS GRAFT  03/11/2012   Procedure: BYPASS GRAFT FEMORAL-TIBIAL ARTERY;  Surgeon: Conrad Laird, MD;  Location: [facility] OR;  Service:  Vascular;  Laterality: Left;  Left Femoral -Tibial trunk bypass, Endarterectomy of Tibial- Peroneal trunk with vein angioplasty.  Marland Kitchen HERNIA REPAIR  1990   (R)  . INTRAOPERATIVE ARTERIOGRAM  03/11/2012   Procedure: INTRA OPERATIVE ARTERIOGRAM;  Surgeon: Conrad Orr, MD;  Location: [place];  Service: Vascular;  Laterality: Left;  . LOWER EXTREMITY ANGIOGRAM Bilateral 12/17/2011   Procedure: LOWER EXTREMITY ANGIOGRAM;  Surgeon: Serafina Mitchell, MD;  Location: [facility] CATH LAB;  Service: Cardiovascular;  Laterality: Bilateral;  bil lower extrem angio  . THORACENTESIS  2008   PLEURAL EFFUSION  . TRANSURETHRAL RESECTION OF PROSTATE  07/30/2011   Procedure: TRANSURETHRAL RESECTION OF THE PROSTATE (TURP);  Surgeon: Hanley Ben, MD;  Location: [facility];  Service: Urology;  Laterality: N/A;   Social History:  reports that he quit smoking about 6 years ago. He quit after 50.00 years of use. He uses smokeless tobacco. He reports that he does not  drink alcohol or use drugs.  Allergies  Allergen Reactions  . Bee Venom Anaphylaxis  . Influenza Vaccines Other (See Comments)    Made patient "sick for months"  . Atorvastatin Other (See Comments)    Severe pain in stumps  . Latex Rash     Family History  Problem Relation Age of Onset  . Heart attack Mother 23  . Heart disease Mother   . Stroke Mother   . Hyperlipidemia Mother   . Hypertension Mother   . COPD Father   . Peripheral vascular disease Father   . Diabetes Brother   . Heart disease Brother   . Hypertension Brother   . Heart attack Brother   . Diabetes Daughter   . Colon cancer Neg Hx   . Prostate cancer Neg Hx      Prior to Admission medications   Medication Sig Start Date End Date Taking? Authorizing Provider  albuterol (PROVENTIL HFA;VENTOLIN HFA) 108 (90 Base) MCG/ACT inhaler Inhale 2 puffs into the lungs every 6 (six) hours as needed for wheezing or shortness of breath. 12/28/15  Yes Dorothy Spark, MD  aspirin  EC 81 MG tablet Take 1 tablet (81 mg total) by mouth daily. 07/07/17  Yes Dorothy Spark, MD  baclofen (LIORESAL) 10 MG tablet Take 0.5 tablets (5 mg total) by mouth 2 (two) times daily. 07/10/18  Yes [name], Adam R, DO  carvedilol (COREG) 3.125 MG tablet Take 1 tablet (3.125 mg total) by mouth 2 (two) times daily with a meal. 11/13/18  Yes Baldwin Jamaica, PA-C  cetirizine (ZYRTEC) 10 MG tablet Take 10 mg by mouth daily. 10/19/18  Yes [provider]  Cholecalciferol (VITAMIN D-3) 1000 units CAPS Take 2,000 Units by mouth daily.   Yes [provider]  diclofenac sodium (VOLTAREN) 1 % GEL Apply 1 application topically 2 (two) times daily as needed (for pain).  03/09/12  Yes Kirsteins, Luanna Salk, MD  furosemide (LASIX) 20 MG tablet Take 1 tablet (20 mg total) by mouth daily as needed for fluid or edema (or shortness of breath). 11/01/17  Yes Lavina Hamman, MD  glucagon (GLUCAGEN) 1 MG SOLR injection Inject 1 mg into the muscle once as needed for low blood sugar. Reported on 11/16/2015   Yes [provider]  guaiFENesin (ROBITUSSIN) 100 MG/5ML SOLN Take 5 mLs by mouth every 4 (four) hours as needed for cough or to loosen phlegm.   Yes [provider]  insulin aspart protamine - aspart (NOVOLOG 70/30 MIX) (70-30) 100 UNIT/ML FlexPen Inject 0.05 mLs (5 Units total) into the skin 2 (two) times daily. Patient taking differently: Inject 5 Units into the skin 2 (two) times daily as needed (CBG >100).  03/12/17  Yes Burns, Claudina Lick, MD  lisinopril (PRINIVIL,ZESTRIL) 10 MG tablet TAKE 1 TABLET BY MOUTH EVERY DAY Patient taking differently: Take 10 mg by mouth daily.  05/26/17  Yes Burns, Claudina Lick, MD  Multiple Vitamins-Minerals (MULTIVITAMIN WITH MINERALS) tablet Take 1 tablet by mouth daily.   Yes [provider]  pantoprazole (PROTONIX) 40 MG tablet Take 1 tablet (40 mg total) by mouth daily with lunch. 12/22/17  Yes Burns, Claudina Lick, MD  pregabalin (LYRICA) 150 MG  capsule Take 150-300 mg by mouth See admin instructions. Take one capsule (150 mg) by mouth in the morning and two capsules (300 mg) at bedtime   Yes [provider]  Propylene Glycol (SYSTANE BALANCE OP) Place 1-2 drops into both eyes daily as needed (  dry eyes).    Yes [provider]  rosuvastatin (CRESTOR) 20 MG tablet TAKE 1 TABLET(20 MG) BY MOUTH DAILY Patient taking differently: Take 20 mg by mouth daily.  04/13/18  Yes Burns, Claudina Lick, MD  traMADol (ULTRAM) 50 MG tablet Take 1 tablet (50 mg total) by mouth every 4 (four) hours as needed. Patient taking differently: Take 50 mg by mouth every 4 (four) hours as needed (for pain).  06/10/17  Yes Jaffe, Adam R, DO  traZODone (DESYREL) 100 MG tablet Take 100 mg by mouth at bedtime as needed for sleep. 11/09/18  Yes [provider]  azelastine (ASTELIN) 0.1 % nasal spray Place 2 sprays into both nostrils at bedtime as needed for rhinitis. Use in each nostril as directed Patient not taking: Reported on 03/27/2018 08/28/15   Colon Branch, MD  nicotine (NICODERM CQ - DOSED IN MG/24 HOURS) 14 mg/24hr patch Place 1 patch (14 mg total) onto the skin daily. Patient not taking: Reported on 11/12/2018 11/02/17   Lavina Hamman, MD    Physical Exam: Vitals:   11/16/18 1500 11/16/18 1700 11/16/18 1800 11/16/18 1900  BP: 100/62 (!) 141/77 134/80 126/72  Pulse: 60     Resp: 11 11 17 10   Temp:      TempSrc:      SpO2: 95%   96%  Weight:        General: Alert, Awake and Oriented to Person. Appear in moderate distress, affect appropriate Eyes: PERRL, Conjunctiva normal ENT: Oral Mucosa clear dry. Neck: no JVD, no Abnormal Mass Or lumps Cardiovascular: S1 and S2 Present, no Murmur, Peripheral Pulses Present Respiratory: increased respiratory effort, Bilateral Air entry equal and Decreased, no use of accessory muscle, bilateral  Crackles, no wheezes Abdomen: Bowel Sound present, Soft and no tenderness, no hernia Skin: no redness, no  Rash, no induration Extremities: bilateral AKA Neurologic: Grossly no focal neuro deficit. Bilaterally Equal motor strength  Labs on Admission:  CBC: Recent Labs  Lab 11/12/18 0939 11/12/18 1011 11/16/18 0958  WBC 5.2  --  6.1  NEUTROABS 2.5  --  3.3  HGB 12.7* 13.3 10.7*  HCT 39.9 39.0 34.3*  MCV 81.9  --  83.9  PLT 128*  --  203*   Basic Metabolic Panel: Recent Labs  Lab 11/12/18 0939 11/12/18 1011 11/13/18 0830 11/16/18 0958  NA 139 140 142 137  K 4.0 4.0 4.2 4.3  CL 107  --  111 106  CO2 22  --  23 24  GLUCOSE 139*  --  113* 121*  BUN 24*  --  16 27*  CREATININE 0.93 0.80 0.74 0.70  CALCIUM 8.8*  --  8.9 8.5*   GFR: CrCl cannot be calculated (Unknown ideal weight.). Liver Function Tests: Recent Labs  Lab 11/12/18 0939 11/16/18 0958  AST 15 19  ALT 11 12  ALKPHOS 46 43  BILITOT 0.6 0.7  PROT 6.8 6.2*  ALBUMIN 3.2* 2.8*   No results for input(s): LIPASE, AMYLASE in the last 168 hours. No results for input(s): AMMONIA in the last 168 hours. Coagulation Profile: No results for input(s): INR, PROTIME in the last 168 hours. Cardiac Enzymes: Recent Labs  Lab 11/12/18 0939  TROPONINI <0.03   BNP (last 3 results) No results for input(s): PROBNP in the last 8760 hours. HbA1C: No results for input(s): HGBA1C in the last 72 hours. CBG: Recent Labs  Lab 11/12/18 2206 11/13/18 0845  GLUCAP 191* 103*   Lipid Profile: No results for  input(s): CHOL, HDL, LDLCALC, TRIG, CHOLHDL, LDLDIRECT in the last 72 hours. Thyroid Function Tests: No results for input(s): TSH, T4TOTAL, FREET4, T3FREE, THYROIDAB in the last 72 hours. Anemia Panel: No results for input(s): VITAMINB12, FOLATE, FERRITIN, TIBC, IRON, RETICCTPCT in the last 72 hours. Urine analysis:    Component Value Date/Time   COLORURINE YELLOW 11/16/2018 1410   APPEARANCEUR HAZY (A) 11/16/2018 1410   LABSPEC 1.018 11/16/2018 1410   PHURINE 5.0 11/16/2018 1410   GLUCOSEU NEGATIVE 11/16/2018 1410    GLUCOSEU NEGATIVE 04/29/2017 1211   HGBUR NEGATIVE 11/16/2018 1410   BILIRUBINUR NEGATIVE 11/16/2018 1410   KETONESUR NEGATIVE 11/16/2018 1410   PROTEINUR NEGATIVE 11/16/2018 1410   UROBILINOGEN 0.2 04/29/2017 1211   NITRITE NEGATIVE 11/16/2018 1410   LEUKOCYTESUR SMALL (A) 11/16/2018 1410    Radiological Exams on Admission: Ct Head Wo Contrast  Result Date: 11/16/2018 CLINICAL DATA:  78 year old male with a history weakness and altered mental status EXAM: CT HEAD WITHOUT CONTRAST TECHNIQUE: Contiguous axial images were obtained from the base of the skull through the vertex without intravenous contrast. COMPARISON:  03/23/2012, 08/19/2006 FINDINGS: Brain: No acute intracranial hemorrhage. No midline shift or mass effect. Gray-white differentiation maintained. Redemonstration of volume loss. Patchy confluent hypodensity in the periventricular white matter, unchanged from prior. Unremarkable appearance of the ventricular system. Vascular: Dense intracranial atherosclerosis Skull: No acute fracture.  No aggressive bone lesion identified. Sinuses/Orbits: Unremarkable appearance of the orbits. Mastoid air cells clear. No middle ear effusion. No significant sinus disease. Other: None IMPRESSION: Negative for acute intracranial abnormality. Chronic microvascular ischemic disease. Electronically Signed   By: Corrie Mckusick D.O.   On: 11/16/2018 14:28   Dg Chest Portable 1 View  Result Date: 11/16/2018 CLINICAL DATA:  Cough, shortness of breath. EXAM: PORTABLE CHEST 1 VIEW COMPARISON:  Radiographs of November 13, 2018. FINDINGS: Stable cardiomediastinal silhouette. No pneumothorax or pleural effusion is noted. Left-sided pacemaker is unchanged in position. Stable left midlung subsegmental atelectasis or scarring is noted. Right lung is clear. Bony thorax is unremarkable. IMPRESSION: Stable left midlung subsegmental atelectasis or scarring. Electronically Signed   By: Marijo Conception M.D.   On: 11/16/2018 09:44    EKG: Independently reviewed.  Paced rhythm.  Assessment/Plan 1.  Pneumonia-possible healthcare associated pneumonia Presented with shortness of breath, hypertension, tachypnea, chest x-ray showing mild infiltrate versus atelectasis. Follow-up on blood cultures. We will treat with IV ceftriaxone azithromycin as well as vancomycin.  Do not think that the patient actually has MRSA risk. Check procalcitonin level. Check MRSA PCR as well. Soft diet. Repeat chest x-ray tomorrow.  2.  Acute metabolic encephalopathy. Likely secondary to pneumonia. CT of the head unremarkable. We will recheck the CT scan tomorrow morning to ensure there is no stroke. Cannot get MRI secondary to pacemaker. PT OT consulted, recommend SNF. Check swallow eval. Check metabolic work-up for encephalopathy as well. Reduce the dose of Lyrica from 450 daily to 300 daily.  3.  Essential hypertension. Hypotension-?  Early sepsis Recently started on Coreg, was already on Lasix, lisinopril. We will currently hold all hypertensive medication and monitor.  4.  Type 2 diabetes mellitus with PVD complication. Check hemoglobin A1c 2 to better mild control. Currently holding 70/30 agent and the patient will be on sliding scale only.  5.  Generalized weakness and deconditioning. Progressive dementia. PT recommends SNF. Social worker consulted. Monitor for sundowning.  6.  Second-degree heart block. Recent pacemaker implant. Chest x-ray as well as EKG shows that the pacemaker is functioning  appropriately. Monitor for now. Hold Coreg.  7.  Multiple sclerosis secondary progressive. Patient has spastic paraplegia which led to nonhealing ulcer and underwent bilateral AKA in the past. Continue to monitor for now.  Nutrition: Soft diet DVT Prophylaxis: subcutaneous Heparin  Advance goals of care discussion: Full code  Consults: None  Family Communication: no family was present at bedside, at the time of  interview.  Discussed with patient's wife on phone, Opportunity was given to ask question and all questions were answered satisfactorily.  Disposition: Admitted as inpatient, telemetry unit. Likely to be discharged SNF, in 3 days.  Severity of Illness: The appropriate patient status for this patient is INPATIENT. Inpatient status is judged to be reasonable and necessary in order to provide the required intensity of service to ensure the patient's safety. The patient's presenting symptoms, physical exam findings, and initial radiographic and laboratory data in the context of their chronic comorbidities is felt to place them at high risk for further clinical deterioration. Furthermore, it is not anticipated that the patient will be medically stable for discharge from the hospital within 2 midnights of admission. The following factors support the patient status of inpatient.   " The patient's presenting symptoms include cough and shortness of breath as well as hypotension and tachypnea meeting sepsis criteria. " The worrisome physical exam findings include bilateral basal crackles, generalized weakness and deconditioning hypoxia and hypotension and tachypnea. " The initial radiographic and laboratory data are worrisome because of chest x-ray showing atelectasis versus infiltrate, acute anemia, acute thrombocytopenia. " The chronic co-morbidities include multiple sclerosis, bilateral BKA, pacemaker implant 7 days ago, CHF, diabetes, dementia.   * I certify that at the point of admission it is my clinical judgment that the patient will require inpatient hospital care spanning beyond 2 midnights from the point of admission due to high intensity of service, high risk for further deterioration and high frequency of surveillance required.*    Author: Berle Mull, MD [facility] 11/16/2018  To reach On-call, see care teams to locate the attending and reach out to them via [email or web address]. If  7PM-7AM, please contact night-coverage If you still have difficulty reaching the attending provider, please page the [facility] (Director on Call) for [facility] on amion for assistance.

## 2018-11-16 NOTE — ED Provider Notes (Signed)
[facility] EMERGENCY DEPARTMENT Provider Note   CSN: [id number] Arrival date & time: 11/16/18  6387    History   Chief Complaint Chief Complaint  Patient presents with  . Altered Mental Status  . Weakness    HPI Jaime [name] is a 78 y.o. male.     HPI   Pt is a 78 y/o male with a h/o PVD, CAD, ischemic cardiomyopathy, dementia, DM, HTN, HLD, MS, bradycardia, who presents to the ED today for eval of generalized weakness, confusion and cough. Pts wife became concerned this AM as she stated that patient seemed very sleeping and was less responsive than normal. She states he developed a cough last night as well.   On my eval, pt is responsive and answers questions appropriately. He c/o a cough that began last night. Cough has been nonproductive. He denies sob or chest pain. He does c/o body aches and pain to his pacemaker site.   Pt is not anticoagulated.   Records reviewed, pt admitted 4/23 for mobitz 1 heart block with symptomatic bradycardia. He had BiV pacemaker placement. He was d/c'ed 4/24.   Past Medical History:  Diagnosis Date  . Atherosclerotic PVD with ulceration ([place])    left foot  . Bacteremia   . CAD (coronary artery disease) 2007   Dr Meda Coffee -  non-STEMI, .occluded circumflex.. Taxus stent placed...residual 80% LAD...50% RCA  . Cardiomyopathy, ischemic    EF 45% per ECHO 2008  //   EF 25%, echo, August, 2013 //  Echo (8/15):  Mild LVH, EF 30-35%, ant-lat and lat AK, inf HK, Gr 1 DD, mild MR, mild LAE  . Carotid artery disease ([employer])    a.  Doppler, February, 2012, 0-39% bilateral,Mild smooth plaque;  b.  Carotid US (8/15):  Bilateral 1-39% ICA >>> F/u 2 years  . Constipation   . Dementia ([place])   . Diabetes mellitus    INSULIN-DEPENDent  . Fatigue SEVERE  . H/O pleural effusion 2008   POST THORACENTESIS  . History of colon polyps PRECANCEROUS  . Hyperlipidemia   . Hypertension   . Impotence   . Increased prostate specific antigen  (PSA) velocity   . Insomnia   . Lung nodule    resolved 11-2006 CT Chest  . Multiple sclerosis ([place]) [place] -- LAST VISIT 11-20-2010  NOTE W/ CHART  . PAC (premature atrial contraction)    December, 2013  . Pulmonary hypertension ([place])    moderate ECHO Jan 2008  . Scoliosis associated with other condition   . Second degree Mobitz I AV block 11/12/2018  . Sleep apnea   . Symptomatic bradycardia 11/13/2018  . Systolic heart failure   . TIA (transient ischemic attack)   . Tobacco abuse    quit   . Urinary retention    dx ~ 2-12, like from [place], now with a catheter, saw urology  . Urinary tract infection    hx of    Patient Active Problem List   Diagnosis Date Noted  . Second degree Mobitz I AV block 11/12/2018  . Abnormal liver function   . Pneumonia 03/27/2018  . UTI due to Klebsiella species 11/24/2017  . Hypoglycemia   . Gram-negative bacteremia   . Cool skin 07/18/2017  . Hematuria 04/29/2017  . GERD (gastroesophageal reflux disease) 04/10/2016  . Coronary artery disease involving native coronary artery of native heart without angina pectoris 12/28/2015  . PAD (peripheral artery disease) ([place]) 12/28/2015  .  Symptomatic bradycardia 08/14/2015  . Chronic systolic heart failure ([place]) 08/14/2015  . Phantom limb pain ([place]) 12/08/2014  . Syncope 11/01/2014  . Severe sepsis with septic shock ([place]) 08/18/2014  . Acute lower UTI 08/18/2014  . Elevated troponin 08/18/2014  . 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 Paraparesis of both lower limbs ([place]) 05/31/2014  . Scoliosis, thoracogenic, acquired 03/15/2014  . Mild cognitive impairment 02/11/2014  . PVD (peripheral vascular disease) ([place]) 08/13/2013  . 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 PAC (premature atrial contraction)   . Peripheral vascular disease, unspecified ([place]) 07/03/2012  . 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 Ejection fraction   . Cardiomyopathy, ischemic   . Pulmonary hypertension ([place])   . Hemiparesis ([place])   . Essential hypertension   . Carotid artery disease ([place])   . Hyperlipidemia 07/31/2007  . BPH (benign prostatic hyperplasia) 04/15/2007  . DMII (diabetes mellitus, type 2) ([place]) 12/18/2006    Past Surgical History:  Procedure Laterality Date  . ABDOMINAL ANGIOGRAM  12/17/2011   Procedure: ABDOMINAL ANGIOGRAM;  Surgeon: Serafina Mitchell, MD;  Location: [facility] CATH LAB;  Service: Cardiovascular;;  . ABDOMINAL AORTAGRAM N/A 08/19/2013   Procedure: ABDOMINAL Maxcine Ham;  Surgeon: Conrad Hinton, MD;  Location: [facility] CATH LAB;  Service: Cardiovascular;  Laterality: N/A;  . AMPUTATION  03/17/2012   Procedure: AMPUTATION ABOVE KNEE;  Surgeon: Conrad [place], MD;  Location: [facility];  Service: Vascular;  Laterality: Left;  . AMPUTATION  06/03/2012   Procedure: AMPUTATION ABOVE KNEE;  Surgeon: Conrad Fortine, MD;  Location: [facility];  Service: Vascular;  Laterality: Right;  . BIV PACEMAKER INSERTION CRT-P N/A 11/12/2018   Procedure: BIV PACEMAKER INSERTION CRT-P;  Surgeon: Evans Lance, MD;  Location: [facility] CV LAB;  Service: Cardiovascular;  Laterality: N/A;  . CHOLECYSTECTOMY N/A 10/25/2014   Procedure: LAPAROSCOPIC CHOLECYSTECTOMY ;  Surgeon: Coralie Keens, MD;  Location: [facility];  Service: General;  Laterality: N/A;  . CORONARY ANGIOPLASTY WITH STENT PLACEMENT  05-01-2006   OCCLUDED CIRCUMFLEX -- TAXUS STENT PLACMENT  AND RESIDUAL 80% LAD,  50% RCA  . CYSTOSCOPY  07/30/2011   Procedure: CYSTOSCOPY;  Surgeon: Hanley Ben, MD;  Location: [facility];  Service: Urology;  Laterality: N/A;  . ERCP N/A 08/25/2014   Procedure: ENDOSCOPIC RETROGRADE CHOLANGIOPANCREATOGRAPHY (ERCP);  Surgeon: Ladene Artist, MD;  Location[facility] ENDOSCOPY;  Service: Endoscopy;  Laterality: N/A;  . FEMORAL-POPLITEAL BYPASS GRAFT  03/11/2012   Procedure: BYPASS GRAFT FEMORAL-POPLITEAL ARTERY;  Surgeon:  Conrad Deemston, MD;  Location: [facility];  Service: Vascular;  Laterality: Left;  embolectomy left lower leg  . FEMORAL-TIBIAL BYPASS GRAFT  03/11/2012   Procedure: BYPASS GRAFT FEMORAL-TIBIAL ARTERY;  Surgeon: Conrad Cokedale, MD;  Location[facility] OR;  Service: Vascular;  Laterality: Left;  Left Femoral -Tibial trunk bypass, Endarterectomy of Tibial- Peroneal trunk with vein angioplasty.  Marland Kitchen HERNIA REPAIR  1990   (R)  . INTRAOPERATIVE ARTERIOGRAM  03/11/2012   Procedure: INTRA OPERATIVE ARTERIOGRAM;  Surgeon: Conrad , MD;  Location: [facility];  Service: Vascular;  Laterality: Left;  . LOWER EXTREMITY ANGIOGRAM Bilateral 12/17/2011   Procedure: LOWER EXTREMITY ANGIOGRAM;  Surgeon: Serafina Mitchell, MD;  Location: [facility] CATH LAB;  Service: Cardiovascular;  Laterality: Bilateral;  bil lower extrem angio  . THORACENTESIS  2008   PLEURAL EFFUSION  . TRANSURETHRAL RESECTION OF PROSTATE  07/30/2011   Procedure: TRANSURETHRAL RESECTION OF THE PROSTATE (  TURP);  Surgeon: Hanley Ben, MD;  Location: [facility];  Service: Urology;  Laterality: N/A;        Home Medications    Prior to Admission medications   Medication Sig Start Date End Date Taking? Authorizing Provider  albuterol (PROVENTIL HFA;VENTOLIN HFA) 108 (90 Base) MCG/ACT inhaler Inhale 2 puffs into the lungs every 6 (six) hours as needed for wheezing or shortness of breath. 12/28/15   Dorothy Spark, MD  aspirin EC 81 MG tablet Take 1 tablet (81 mg total) by mouth daily. 07/07/17   Dorothy Spark, MD  azelastine (ASTELIN) 0.1 % nasal spray Place 2 sprays into both nostrils at bedtime as needed for rhinitis. Use in each nostril as directed Patient not taking: Reported on 03/27/2018 08/28/15   Colon Branch, MD  baclofen (LIORESAL) 10 MG tablet Take 0.5 tablets (5 mg total) by mouth 2 (two) times daily. 07/10/18   Pieter Partridge, DO  carvedilol (COREG) 3.125 MG tablet Take 1 tablet (3.125 mg total) by mouth 2 (two) times daily with a meal.  11/13/18   Baldwin Jamaica, PA-C  cetirizine (ZYRTEC) 10 MG tablet Take 10 mg by mouth daily. 10/19/18   [provider]  Cholecalciferol (VITAMIN D-3) 1000 units CAPS Take 2,000 Units by mouth daily.    [provider]  diclofenac sodium (VOLTAREN) 1 % GEL Apply 1 application topically 2 (two) times daily as needed (FOR PAIN).  03/09/12   Kirsteins, Luanna Salk, MD  furosemide (LASIX) 20 MG tablet Take 1 tablet (20 mg total) by mouth daily as needed for fluid or edema (or shortness of breath). 11/01/17   Lavina Hamman, MD  glucagon (GLUCAGEN) 1 MG SOLR injection Inject 1 mg into the muscle once as needed for low blood sugar. Reported on 11/16/2015    [provider]  guaiFENesin (ROBITUSSIN) 100 MG/5ML SOLN Take 5 mLs by mouth every 4 (four) hours as needed for cough or to loosen phlegm.    [provider]  insulin aspart protamine - aspart (NOVOLOG 70/30 MIX) (70-30) 100 UNIT/ML FlexPen Inject 0.05 mLs (5 Units total) into the skin 2 (two) times daily. Patient taking differently: Inject 5 Units into the skin 2 (two) times daily as needed (if BGL is >100).  03/12/17   Burns, Claudina Lick, MD  lisinopril (PRINIVIL,ZESTRIL) 10 MG tablet TAKE 1 TABLET BY MOUTH EVERY DAY Patient taking differently: Take 10 mg by mouth daily.  05/26/17   [name], MD  Multiple Vitamins-Minerals (MULTIVITAMIN WITH MINERALS) tablet Take 1 tablet by mouth daily.    [provider]  nicotine (NICODERM CQ - DOSED IN MG/24 HOURS) 14 mg/24hr patch Place 1 patch (14 mg total) onto the skin daily. Patient not taking: Reported on 11/12/2018 11/02/17   Lavina Hamman, MD  pantoprazole (PROTONIX) 40 MG tablet Take 1 tablet (40 mg total) by mouth daily with lunch. 12/22/17   [name], MD  pregabalin (LYRICA) 150 MG capsule Take 150-300 mg by mouth See admin instructions. Take 150 mg by mouth in the morning and 300 mg at bedtime    [provider]  Propylene Glycol (SYSTANE BALANCE  OP) Place 1-2 drops into both eyes daily.    [provider]  rosuvastatin (CRESTOR) 20 MG tablet TAKE 1 TABLET(20 MG) BY MOUTH DAILY Patient taking differently: Take 20 mg by mouth daily.  04/13/18   [name], MD  saccharomyces boulardii (FLORASTOR) 250 MG capsule Take  1 capsule (250 mg total) by mouth 2 (two) times daily. Patient not taking: Reported on 11/12/2018 04/02/18   Barton Dubois, MD  traMADol (ULTRAM) 50 MG tablet Take 1 tablet (50 mg total) by mouth every 4 (four) hours as needed. Patient taking differently: Take 50 mg by mouth every 4 (four) hours as needed (for pain).  06/10/17   Pieter Partridge, DO  traZODone (DESYREL) 100 MG tablet Take 100 mg by mouth at bedtime as needed for sleep. 11/09/18   [provider]    Family History Family History  Problem Relation Age of Onset  . Heart attack Mother 12  . Heart disease Mother   . Stroke Mother   . Hyperlipidemia Mother   . Hypertension Mother   . COPD Father   . Peripheral vascular disease Father   . Diabetes Brother   . Heart disease Brother   . Hypertension Brother   . Heart attack Brother   . Diabetes Daughter   . Colon cancer Neg Hx   . Prostate cancer Neg Hx     Social History Social History   Tobacco Use  . Smoking status: Former Smoker    Years: 50.00    Last attempt to quit: 07/22/2012    Years since quitting: 6.3  . Smokeless tobacco: Current User  . Tobacco comment: pt states that he is using E-cigs  Substance Use Topics  . Alcohol use: No    Alcohol/week: 0.0 standard drinks  . Drug use: No     Allergies   Bee venom; Beta adrenergic blockers; Carvedilol; Influenza vaccines; Atorvastatin; and Latex   Review of Systems Review of Systems  Constitutional: Positive for chills. Negative for fever.  HENT: Negative for ear pain and sore throat.   Eyes: Negative for visual disturbance.  Respiratory: Positive for cough. Negative for shortness of breath.   Cardiovascular: Negative  for chest pain and palpitations.  Gastrointestinal: Negative for abdominal pain and vomiting.  Genitourinary: Negative for dysuria, flank pain and hematuria.  Musculoskeletal: Positive for myalgias.  Skin: Negative for color change and rash.  Neurological: Positive for weakness. Negative for numbness and headaches.       AMS per wife  All other systems reviewed and are negative.   Physical Exam Updated Vital Signs BP 93/69   Pulse 60   Temp 98.1 F (36.7 C) (Rectal)   Resp 12   Wt 65.3 kg   SpO2 98%   BMI 20.08 kg/m   Physical Exam Vitals signs and nursing note reviewed.  Constitutional:      Appearance: He is well-developed.     Comments: Chronically ill appearing   HENT:     Head: Normocephalic and atraumatic.     Mouth/Throat:     Mouth: Mucous membranes are moist.  Eyes:     Conjunctiva/sclera: Conjunctivae normal.  Neck:     Musculoskeletal: Neck supple.  Cardiovascular:     Rate and Rhythm: Normal rate and regular rhythm.     Pulses: Normal pulses.     Heart sounds: Normal heart sounds. No murmur.     Comments: Mild left upper chest wall ttp over pacemaker site. Surgical incision is well healing. Scant blood noted to wound. No purulent drainage. No erythema or induration noted. Pulmonary:     Effort: Pulmonary effort is normal.     Breath sounds: Normal breath sounds.     Comments: Coarse breath sounds bilat, wet cough on exam Abdominal:     General: Bowel sounds  are normal.     Palpations: Abdomen is soft.     Tenderness: There is no abdominal tenderness. There is no guarding or rebound.  Genitourinary:    Comments: Condom cath in place.  Musculoskeletal:     Comments: S/p bilat aka's. No edema to BLE.   Skin:    General: Skin is warm and dry.  Neurological:     Mental Status: He is alert.     Comments: Mental Status:  Alert, thought content appropriate, able to give a coherent history. Speech fluent without evidence of aphasia. Able to follow 2 step  commands without difficulty. Oriented to self and place. Not oriented to date.  Cranial Nerves:  II: pupils equal, round, reactive to light III,IV, VI: ptosis not present, extra-ocular motions intact bilaterally  V,VII: smile symmetric, facial light touch sensation equal VIII: hearing grossly normal to voice  X: uvula elevates symmetrically  XI: bilateral shoulder shrug symmetric and strong XII: midline tongue extension without fassiculations Motor:  Normal tone. Generalized weakness but appears to be able to move all extremities. Sensory: light touch normal in all extremities. Gait: not assessed      ED Treatments / Results  Labs (all labs ordered are listed, but only abnormal results are displayed) Labs Reviewed  CBC WITH DIFFERENTIAL/PLATELET - Abnormal; Notable for the following components:      Result Value   RBC 4.09 (*)    Hemoglobin 10.7 (*)    HCT 34.3 (*)    RDW 17.0 (*)    Platelets 120 (*)    All other components within normal limits  COMPREHENSIVE METABOLIC PANEL - Abnormal; Notable for the following components:   Glucose, Bld 121 (*)    BUN 27 (*)    Calcium 8.5 (*)    Total Protein 6.2 (*)    Albumin 2.8 (*)    All other components within normal limits  URINALYSIS, ROUTINE W REFLEX MICROSCOPIC - Abnormal; Notable for the following components:   APPearance HAZY (*)    Leukocytes,Ua SMALL (*)    Bacteria, UA RARE (*)    All other components within normal limits  BRAIN NATRIURETIC PEPTIDE - Abnormal; Notable for the following components:   B Natriuretic Peptide 110.8 (*)    All other components within normal limits  CULTURE, BLOOD (ROUTINE X 2)  CULTURE, BLOOD (ROUTINE X 2)  URINE CULTURE  SARS CORONAVIRUS 2 (HOSPITAL ORDER, [facility])  LACTIC ACID, PLASMA  LACTIC ACID, PLASMA    EKG EKG Interpretation  Date/Time:  Monday November 16 2018 08:46:36 EDT Ventricular Rate:  60 PR Interval:    QRS Duration: 124 QT Interval:   437 QTC Calculation: 437 R Axis:   -84 Text Interpretation:  Atrial-ventricular dual-paced rhythm No further analysis attempted due to paced rhythm Confirmed by Davonna Belling [phone]) on 11/16/2018 8:52:59 AM   Radiology Ct Head Wo Contrast  Result Date: 11/16/2018 CLINICAL DATA:  78 year old male with a history weakness and altered mental status EXAM: CT HEAD WITHOUT CONTRAST TECHNIQUE: Contiguous axial images were obtained from the base of the skull through the vertex without intravenous contrast. COMPARISON:  03/23/2012, 08/19/2006 FINDINGS: Brain: No acute intracranial hemorrhage. No midline shift or mass effect. Gray-white differentiation maintained. Redemonstration of volume loss. Patchy confluent hypodensity in the periventricular white matter, unchanged from prior. Unremarkable appearance of the ventricular system. Vascular: Dense intracranial atherosclerosis Skull: No acute fracture.  No aggressive bone lesion identified. Sinuses/Orbits: Unremarkable appearance of the orbits. Mastoid air cells  clear. No middle ear effusion. No significant sinus disease. Other: None IMPRESSION: Negative for acute intracranial abnormality. Chronic microvascular ischemic disease. Electronically Signed   By: Corrie Mckusick D.O.   On: 11/16/2018 14:28   Dg Chest Portable 1 View  Result Date: 11/16/2018 CLINICAL DATA:  Cough, shortness of breath. EXAM: PORTABLE CHEST 1 VIEW COMPARISON:  Radiographs of November 13, 2018. FINDINGS: Stable cardiomediastinal silhouette. No pneumothorax or pleural effusion is noted. Left-sided pacemaker is unchanged in position. Stable left midlung subsegmental atelectasis or scarring is noted. Right lung is clear. Bony thorax is unremarkable. IMPRESSION: Stable left midlung subsegmental atelectasis or scarring. Electronically Signed   By: Marijo Conception M.D.   On: 11/16/2018 09:44    Procedures Procedures (including critical care time)  Medications Ordered in ED Medications   azithromycin (ZITHROMAX) 500 mg in sodium chloride 0.9 % 250 mL IVPB (has no administration in time range)  sodium chloride 0.9 % bolus 500 mL (0 mLs Intravenous Stopped 11/16/18 1303)  cefTRIAXone (ROCEPHIN) 1 g in sodium chloride 0.9 % 100 mL IVPB (0 g Intravenous Stopped 11/16/18 1446)     Initial Impression / Assessment and Plan / ED Course  I have reviewed the triage vital signs and the nursing notes.  Pertinent labs & imaging results that were available during my care of the patient were reviewed by me and considered in my medical decision making (see chart for details).       Final Clinical Impressions(s) / ED Diagnoses   Final diagnoses:  None   Pt presenting for eval of generalized weakness, decreased mental status, and cough. Pt afebrile. Is borderline hypotensive, but otherwise his vitals are reassuring. Initial O2 sat was 87% on ra, however pt laying flat and has a h/o OSA. His sats have been normal throughout the remainder of his stay.   On my eval mental status is improved and his neuro exam is non-focal. He has coarse lung sounds and a wet cough. No obvious peripheral edema.   CBC without leukocytosis. Mild anemia, grossly stable.  CMP with elevated BUN at 27.  Lactic acid is negative. Blood cultures obtained. UA with small leukocytes, 0-5 RBCs, 6-10 WBCs, rare bacteria. Will send culture. He is not reporting sxs.  CXR shows atelectasis/scarring to left midlung, however clinically I have high suspicion for pneumonia.  EKG with atrial-ventricular dual-paced rhythm No further analysis attempted due to paced rhythm   Pt care signed out pending COVID swab results. Plan for admission for suspected pneumonia given his cough. Suspect inpatient treatment will be required given his confused this AM, elevated BUN, elevated age and hypotension.   Care transitioned to Dr. Guillermina [place] pending COVID results and admission.   ED Discharge Orders         [place]      11/16/18 1514    Face-to-face encounter (required for Medicare/Medicaid patients)    Comments:  I [place] certify that this patient is under my care and that I, or a nurse practitioner or physician's assistant working with me, had a face-to-face encounter that meets the physician face-to-face encounter requirements with this patient on 11/16/2018. The encounter with the patient was in whole, or in part for the following medical condition(s) which is the primary reason for home health care (List medical condition): pt is s/p bilat AKA and is having increased weakness and requiring more assistance with ADLs at home.   11/16/18 1514  [place] 11/16/18 1550    Davonna Belling, MD 11/17/18 [phone]

## 2018-11-16 NOTE — ED Notes (Signed)
ED TO INPATIENT HANDOFF REPORT  ED Nurse Name and Phone #: Caprice Kluver [phone]  S Name/Age/Gender Jaime [name] 78 y.o. male Room/Bed: 019C/019C  Code Status   Code Status: Prior  Home/SNF/Other Home Patient oriented to: self, place, time and situation Is this baseline? Yes   Triage Complete: Triage complete  Chief Complaint weakness  Triage Note Patient arrived via EMS complaints of increased weakness for 2 days. Family called EMS and report decline in alertness  Reports change in appetites. Recently admitted for hypotension and bradycardia and pacemaker place. Pain reported to left side of body up to left head. Denies shortness or breath. Reports new chest congestion without runny nose. Denies chest pain. Eyes pinpoint at this time.    Allergies Allergies  Allergen Reactions  . Bee Venom Anaphylaxis  . Influenza Vaccines Other (See Comments)    Made patient "sick for months"  . Atorvastatin Other (See Comments)    Severe pain in stumps  . Latex Rash    Level of Care/Admitting Diagnosis ED Disposition    ED Disposition Condition [facility] Area: [place] [[id number]]  Level of Care: Telemetry Medical [104]  Covid Evaluation: N/A  Diagnosis: Acute metabolic encephalopathy [[id number]]  Admitting Physician: Lavina Hamman [[id number]]  Attending Physician: Lavina Hamman [[id number]]  Estimated length of stay: past midnight tomorrow  Certification:: I certify this patient will need inpatient services for at least 2 midnights  PT Class (Do Not Modify): Inpatient [101]  PT Acc Code (Do Not Modify): Private [1]       B Medical/Surgery History Past Medical History:  Diagnosis Date  . Atherosclerotic PVD with ulceration ([place])    left foot  . Bacteremia   . CAD (coronary artery disease) 2007   Dr Meda Coffee -  non-STEMI, .occluded circumflex.. Taxus stent placed...residual 80% LAD...50% RCA  . Cardiomyopathy, ischemic    EF 45% per ECHO  2008  //   EF 25%, echo, August, 2013 //  Echo (8/15):  Mild LVH, EF 30-35%, ant-lat and lat AK, inf HK, Gr 1 DD, mild MR, mild LAE  . Carotid artery disease ([place])    a.  Doppler, February, 2012, 0-39% bilateral,Mild smooth plaque;  b.  Carotid US (8/15):  Bilateral 1-39% ICA >>> F/u 2 years  . Constipation   . Dementia ([place])   . Diabetes mellitus    INSULIN-DEPENDent  . Fatigue SEVERE  . H/O pleural effusion 2008   POST THORACENTESIS  . History of colon polyps PRECANCEROUS  . Hyperlipidemia   . Hypertension   . Impotence   . Increased prostate specific antigen (PSA) velocity   . Insomnia   . Lung nodule    resolved 11-2006 CT Chest  . Multiple sclerosis ([place]) [place] -- LAST VISIT 11-20-2010  NOTE W/ CHART  . PAC (premature atrial contraction)    December, 2013  . Pulmonary hypertension ([place])    moderate ECHO Jan 2008  . Scoliosis associated with other condition   . Second degree Mobitz I AV block 11/12/2018  . Sleep apnea   . Symptomatic bradycardia 11/13/2018  . Systolic heart failure   . TIA (transient ischemic attack)   . Tobacco abuse    quit   . Urinary retention    dx ~ 2-12, like from [place], now with a catheter, saw urology  . Urinary tract infection    hx of   Past Surgical History:  Procedure Laterality  Date  . ABDOMINAL ANGIOGRAM  12/17/2011   Procedure: ABDOMINAL ANGIOGRAM;  Surgeon: Serafina Mitchell, MD;  Location: [facility] CATH LAB;  Service: Cardiovascular;;  . ABDOMINAL AORTAGRAM N/A 08/19/2013   Procedure: ABDOMINAL Maxcine Ham;  Surgeon: Conrad [place], MD;  Location: [facility] CATH LAB;  Service: Cardiovascular;  Laterality: N/A;  . AMPUTATION  03/17/2012   Procedure: AMPUTATION ABOVE KNEE;  Surgeon: Conrad Mitchell, MD;  Location: [facility];  Service: Vascular;  Laterality: Left;  . AMPUTATION  06/03/2012   Procedure: AMPUTATION ABOVE KNEE;  Surgeon: Conrad Camas, MD;  Location: [place];  Service: Vascular;  Laterality: Right;  . BIV PACEMAKER INSERTION  CRT-P N/A 11/12/2018   Procedure: BIV PACEMAKER INSERTION CRT-P;  Surgeon: Evans Lance, MD;  Location: [facility] CV LAB;  Service: Cardiovascular;  Laterality: N/A;  . CHOLECYSTECTOMY N/A 10/25/2014   Procedure: LAPAROSCOPIC CHOLECYSTECTOMY ;  Surgeon: Coralie Keens, MD;  Location: [facility];  Service: General;  Laterality: N/A;  . CORONARY ANGIOPLASTY WITH STENT PLACEMENT  05-01-2006   OCCLUDED CIRCUMFLEX -- TAXUS STENT PLACMENT  AND RESIDUAL 80% LAD,  50% RCA  . CYSTOSCOPY  07/30/2011   Procedure: CYSTOSCOPY;  Surgeon: Hanley Ben, MD;  Location: [facility];  Service: Urology;  Laterality: N/A;  . ERCP N/A 08/25/2014   Procedure: ENDOSCOPIC RETROGRADE CHOLANGIOPANCREATOGRAPHY (ERCP);  Surgeon: Ladene Artist, MD;  Location: [facility] ENDOSCOPY;  Service: Endoscopy;  Laterality: N/A;  . FEMORAL-POPLITEAL BYPASS GRAFT  03/11/2012   Procedure: BYPASS GRAFT FEMORAL-POPLITEAL ARTERY;  Surgeon: Conrad Parmer, MD;  Location: [place] [facility];  Service: Vascular;  Laterality: Left;  embolectomy left lower leg  . FEMORAL-TIBIAL BYPASS GRAFT  03/11/2012   Procedure: BYPASS GRAFT FEMORAL-TIBIAL ARTERY;  Surgeon: Conrad Kingstree, MD;  [facility];  Service: Vascular;  Laterality: Left;  Left Femoral -Tibial trunk bypass, Endarterectomy of Tibial- Peroneal trunk with vein angioplasty.  Marland Kitchen HERNIA REPAIR  1990   (R)  . INTRAOPERATIVE ARTERIOGRAM  03/11/2012   Procedure: INTRA OPERATIVE ARTERIOGRAM;  Surgeon: Conrad [employer], MD;  Location: [place];  Service: Vascular;  Laterality: Left;  . LOWER EXTREMITY ANGIOGRAM Bilateral 12/17/2011   Procedure: LOWER EXTREMITY ANGIOGRAM;  Surgeon: Serafina Mitchell, MD;  Location: [facility] CATH LAB;  Service: Cardiovascular;  Laterality: Bilateral;  bil lower extrem angio  . THORACENTESIS  2008   PLEURAL EFFUSION  . TRANSURETHRAL RESECTION OF PROSTATE  07/30/2011   Procedure: TRANSURETHRAL RESECTION OF THE PROSTATE (TURP);  Surgeon: Hanley Ben, MD;  [facility];  Service: Urology;  Laterality: N/A;     A IV Location/Drains/Wounds Patient Lines/Drains/Airways Status   Active Line/Drains/Airways    Name:   Placement date:   Placement time:   Site:   Days:   Peripheral IV 11/12/18 Anterior;Distal;Left Forearm   11/12/18    1600    Forearm   4   Peripheral IV 11/16/18 Right Hand   11/16/18    0948    Hand   less than 1   External Urinary Catheter   10/27/17    2300    -   385   External Urinary Catheter   11/12/18    1600    -   4          Intake/Output Last 24 hours  Intake/Output Summary (Last 24 hours) at 11/16/2018 1849 Last data filed at 11/16/2018 1446 Gross per 24 hour  Intake 600 ml  Output -  Net 600 ml  Labs/Imaging Results for orders placed or performed during the hospital encounter of 11/16/18 (from the past 48 hour(s))  Brain natriuretic peptide     Status: Abnormal   Collection Time: 11/16/18  9:20 AM  Result Value Ref Range   B Natriuretic Peptide 110.8 (H) 0.0 - 100.0 pg/mL    Comment: Performed at [facility], [address] [place], [place] [address]  CBC with Differential     Status: Abnormal   Collection Time: 11/16/18  9:58 AM  Result Value Ref Range   WBC 6.1 4.0 - 10.5 K/uL   RBC 4.09 (L) 4.22 - 5.81 MIL/uL   Hemoglobin 10.7 (L) 13.0 - 17.0 g/dL   HCT 34.3 (L) 39.0 - 52.0 %   MCV 83.9 80.0 - 100.0 fL   MCH 26.2 26.0 - 34.0 pg   MCHC 31.2 30.0 - 36.0 g/dL   RDW 17.0 (H) 11.5 - 15.5 %   Platelets 120 (L) 150 - 400 K/uL    Comment: REPEATED TO VERIFY   nRBC 0.0 0.0 - 0.2 %   Neutrophils Relative % 55 %   Neutro Abs 3.3 1.7 - 7.7 K/uL   Lymphocytes Relative 30 %   Lymphs Abs 1.8 0.7 - 4.0 K/uL   Monocytes Relative 11 %   Monocytes Absolute 0.7 0.1 - 1.0 K/uL   Eosinophils Relative 4 %   Eosinophils Absolute 0.3 0.0 - 0.5 K/uL   Basophils Relative 0 %   Basophils Absolute 0.0 0.0 - 0.1 K/uL   Immature Granulocytes 0 %   Abs Immature Granulocytes 0.01 0.00 - 0.07 K/uL    Comment:  Performed at [facility], [place] [address] [place], [place] [address]  Comprehensive metabolic panel     Status: Abnormal   Collection Time: 11/16/18  9:58 AM  Result Value Ref Range   Sodium 137 135 - 145 mmol/L   Potassium 4.3 3.5 - 5.1 mmol/L   Chloride 106 98 - 111 mmol/L   CO2 24 22 - 32 mmol/L   Glucose, Bld 121 (H) 70 - 99 mg/dL   BUN 27 (H) 8 - 23 mg/dL   Creatinine, Ser 0.70 0.61 - 1.24 mg/dL   Calcium 8.5 (L) 8.9 - 10.3 mg/dL   Total Protein 6.2 (L) 6.5 - 8.1 g/dL   Albumin 2.8 (L) 3.5 - 5.0 g/dL   AST 19 15 - 41 U/L   ALT 12 0 - 44 U/L   Alkaline Phosphatase 43 38 - 126 U/L   Total Bilirubin 0.7 0.3 - 1.2 mg/dL   GFR calc non Af Amer >60 >60 mL/min   GFR calc Af Amer >60 >60 mL/min   Anion gap 7 5 - 15    Comment: Performed at [facility] [address], [place], [place] [address]  Lactic acid, plasma     Status: None   Collection Time: 11/16/18  9:58 AM  Result Value Ref Range   Lactic Acid, Venous 0.6 0.5 - 1.9 mmol/L    Comment: Performed at [facility] [address] [place],  [address]  Urinalysis, Routine w reflex microscopic     Status: Abnormal   Collection Time: 11/16/18  2:10 PM  Result Value Ref Range   Color, Urine YELLOW YELLOW   APPearance HAZY (A) CLEAR   Specific Gravity, Urine 1.018 1.005 - 1.030   pH 5.0 5.0 - 8.0   Glucose, UA NEGATIVE NEGATIVE mg/dL   Hgb urine dipstick NEGATIVE NEGATIVE  Bilirubin Urine NEGATIVE NEGATIVE   Ketones, ur NEGATIVE NEGATIVE mg/dL   Protein, ur NEGATIVE NEGATIVE mg/dL   Nitrite NEGATIVE NEGATIVE   Leukocytes,Ua SMALL (A) NEGATIVE   RBC / HPF 0-5 0 - 5 RBC/hpf   WBC, UA 6-10 0 - 5 WBC/hpf   Bacteria, UA RARE (A) NONE SEEN   Squamous Epithelial / LPF 0-5 0 - 5   Mucus PRESENT    Hyaline Casts, UA PRESENT     Comment: Performed at [facility], [place] [address], [place], [place] [address]  SARS Coronavirus 2 [facility] order, Performed in [facility] hospital lab)     Status:  None   Collection Time: 11/16/18  3:00 PM  Result Value Ref Range   SARS Coronavirus 2 NEGATIVE NEGATIVE    Comment: (NOTE) If result is NEGATIVE SARS-CoV-2 target nucleic acids are NOT DETECTED. The SARS-CoV-2 RNA is generally detectable in upper and lower  respiratory specimens during the acute phase of infection. The lowest  concentration of SARS-CoV-2 viral copies this assay can detect is 250  copies / mL. A negative result does not preclude SARS-CoV-2 infection  and should not be used as the sole basis for treatment or other  patient management decisions.  A negative result may occur with  improper specimen collection / handling, submission of specimen other  than nasopharyngeal swab, presence of viral mutation(s) within the  areas targeted by this assay, and inadequate number of viral copies  (<250 copies / mL). A negative result must be combined with clinical  observations, patient history, and epidemiological information. If result is POSITIVE SARS-CoV-2 target nucleic acids are DETECTED. The SARS-CoV-2 RNA is generally detectable in upper and lower  respiratory specimens dur ing the acute phase of infection.  Positive  results are indicative of active infection with SARS-CoV-2.  Clinical  correlation with patient history and other diagnostic information is  necessary to determine patient infection status.  Positive results do  not rule out bacterial infection or co-infection with other viruses. If result is PRESUMPTIVE POSTIVE SARS-CoV-2 nucleic acids MAY BE PRESENT.   A presumptive positive result was obtained on the submitted specimen  and confirmed on repeat testing.  While 2019 novel coronavirus  (SARS-CoV-2) nucleic acids may be present in the submitted sample  additional confirmatory testing may be necessary for epidemiological  and / or clinical management purposes  to differentiate between  SARS-CoV-2 and other Sarbecovirus currently known to infect humans.  If  clinically indicated additional testing with an alternate test  methodology [phone]) is advised. The SARS-CoV-2 RNA is generally  detectable in upper and lower respiratory sp ecimens during the acute  phase of infection. The expected result is Negative. Fact Sheet for Patients:  StrictlyIdeas.no Fact Sheet for Healthcare Providers: BankingDealers.co.za This test is not yet approved or cleared by the [place] FDA and has been authorized for detection and/or diagnosis of SARS-CoV-2 by FDA under an Emergency Use Authorization (EUA).  This EUA will remain in effect (meaning this test can be used) for the duration of the COVID-19 declaration under Section 564(b)(1) of the Act, 21 U.S.C. section 360bbb-3(b)(1), unless the authorization is terminated or revoked sooner. Performed at [facility], [place] [address], [place], [place] [address]    Ct Head Wo Contrast  Result Date: 11/16/2018 CLINICAL DATA:  78 year old male with a history weakness and altered mental status EXAM: CT HEAD WITHOUT CONTRAST TECHNIQUE: Contiguous axial images were obtained from the base of the skull through  the vertex without intravenous contrast. COMPARISON:  03/23/2012, 08/19/2006 FINDINGS: Brain: No acute intracranial hemorrhage. No midline shift or mass effect. Gray-white differentiation maintained. Redemonstration of volume loss. Patchy confluent hypodensity in the periventricular white matter, unchanged from prior. Unremarkable appearance of the ventricular system. Vascular: Dense intracranial atherosclerosis Skull: No acute fracture.  No aggressive bone lesion identified. Sinuses/Orbits: Unremarkable appearance of the orbits. Mastoid air cells clear. No middle ear effusion. No significant sinus disease. Other: None IMPRESSION: Negative for acute intracranial abnormality. Chronic microvascular ischemic disease. Electronically Signed   By: Corrie Mckusick D.O.   On:  11/16/2018 14:28   Dg Chest Portable 1 View  Result Date: 11/16/2018 CLINICAL DATA:  Cough, shortness of breath. EXAM: PORTABLE CHEST 1 VIEW COMPARISON:  Radiographs of November 13, 2018. FINDINGS: Stable cardiomediastinal silhouette. No pneumothorax or pleural effusion is noted. Left-sided pacemaker is unchanged in position. Stable left midlung subsegmental atelectasis or scarring is noted. Right lung is clear. Bony thorax is unremarkable. IMPRESSION: Stable left midlung subsegmental atelectasis or scarring. Electronically Signed   By: Marijo Conception M.D.   On: 11/16/2018 09:44    Pending Labs Unresulted Labs (From admission, onward)    Start     Ordered   11/16/18 1445  Urine culture  ONCE - STAT,   STAT     11/16/18 1444   11/16/18 0906  Blood culture (routine x 2)  BLOOD CULTURE X 2,   STAT     11/16/18 0905          Vitals/Pain Today's Vitals   11/16/18 1300 11/16/18 1500 11/16/18 1700 11/16/18 1800  BP: 93/69 100/62 (!) 141/77 134/80  Pulse:  60    Resp: 12 11 11 17   Temp:      TempSrc:      SpO2:  95%    Weight:      PainSc:        Isolation Precautions No active isolations  Medications Medications  sodium chloride 0.9 % bolus 500 mL (0 mLs Intravenous Stopped 11/16/18 1303)  cefTRIAXone (ROCEPHIN) 1 g in sodium chloride 0.9 % 100 mL IVPB (0 g Intravenous Stopped 11/16/18 1446)    Mobility  High fall risk   Focused Assessments Neuro Assessment Handoff:  Swallow screen pass     Last date known well: 11/15/18   Neuro Assessment: Within Defined Limits Neuro Checks:     Last Documented NIHSS Modified Score:   Has TPA been given? No If patient is a Neuro Trauma and patient is going to OR before floor call report to [place] nurse: [phone] or [phone]     R Recommendations: See Admitting Provider Note  Report given to:   Additional Notes:

## 2018-11-17 ENCOUNTER — Inpatient Hospital Stay (HOSPITAL_COMMUNITY): Payer: Medicare Other

## 2018-11-17 DIAGNOSIS — J069 Acute upper respiratory infection, unspecified: Secondary | ICD-10-CM

## 2018-11-17 DIAGNOSIS — I10 Essential (primary) hypertension: Secondary | ICD-10-CM

## 2018-11-17 DIAGNOSIS — R4182 Altered mental status, unspecified: Secondary | ICD-10-CM

## 2018-11-17 LAB — URINE CULTURE: Culture: NO GROWTH

## 2018-11-17 LAB — CBC WITH DIFFERENTIAL/PLATELET
Abs Immature Granulocytes: 0 10*3/uL (ref 0.00–0.07)
Basophils Absolute: 0 10*3/uL (ref 0.0–0.1)
Basophils Relative: 0 %
Eosinophils Absolute: 0.2 10*3/uL (ref 0.0–0.5)
Eosinophils Relative: 4 %
HCT: 39 % (ref 39.0–52.0)
Hemoglobin: 12.3 g/dL — ABNORMAL LOW (ref 13.0–17.0)
Immature Granulocytes: 0 %
Lymphocytes Relative: 30 %
Lymphs Abs: 1.6 10*3/uL (ref 0.7–4.0)
MCH: 26.3 pg (ref 26.0–34.0)
MCHC: 31.5 g/dL (ref 30.0–36.0)
MCV: 83.3 fL (ref 80.0–100.0)
Monocytes Absolute: 0.5 10*3/uL (ref 0.1–1.0)
Monocytes Relative: 8 %
Neutro Abs: 3.2 10*3/uL (ref 1.7–7.7)
Neutrophils Relative %: 58 %
Platelets: 108 10*3/uL — ABNORMAL LOW (ref 150–400)
RBC: 4.68 MIL/uL (ref 4.22–5.81)
RDW: 16.6 % — ABNORMAL HIGH (ref 11.5–15.5)
WBC: 5.5 10*3/uL (ref 4.0–10.5)
nRBC: 0 % (ref 0.0–0.2)

## 2018-11-17 LAB — COMPREHENSIVE METABOLIC PANEL
ALT: 11 U/L (ref 0–44)
AST: 22 U/L (ref 15–41)
Albumin: 3.1 g/dL — ABNORMAL LOW (ref 3.5–5.0)
Alkaline Phosphatase: 51 U/L (ref 38–126)
Anion gap: 13 (ref 5–15)
BUN: 19 mg/dL (ref 8–23)
CO2: 19 mmol/L — ABNORMAL LOW (ref 22–32)
Calcium: 8.8 mg/dL — ABNORMAL LOW (ref 8.9–10.3)
Chloride: 107 mmol/L (ref 98–111)
Creatinine, Ser: 0.61 mg/dL (ref 0.61–1.24)
GFR calc Af Amer: >60 mL/min (ref >60)
GFR calc non Af Amer: >60 mL/min (ref >60)
Glucose, Bld: 80 mg/dL (ref 70–99)
Potassium: 4.4 mmol/L (ref 3.5–5.1)
Sodium: 139 mmol/L (ref 135–145)
Total Bilirubin: 1 mg/dL (ref 0.3–1.2)
Total Protein: 6.8 g/dL (ref 6.5–8.1)

## 2018-11-17 LAB — GLUCOSE, CAPILLARY
Glucose-Capillary: 58 mg/dL — ABNORMAL LOW (ref 70–99)
Glucose-Capillary: 69 mg/dL — ABNORMAL LOW (ref 70–99)
Glucose-Capillary: 119 mg/dL — ABNORMAL HIGH (ref 70–99)
Glucose-Capillary: 180 mg/dL — ABNORMAL HIGH (ref 70–99)
Glucose-Capillary: 122 mg/dL — ABNORMAL HIGH (ref 70–99)
Glucose-Capillary: 86 mg/dL (ref 70–99)
Glucose-Capillary: 85 mg/dL (ref 70–99)

## 2018-11-17 LAB — CULTURE, BLOOD (ROUTINE X 2)
Culture: NO GROWTH
Culture: NO GROWTH
Special Requests: ADEQUATE

## 2018-11-17 LAB — HIV ANTIBODY (ROUTINE TESTING W REFLEX): HIV Screen 4th Generation wRfx: NONREACTIVE

## 2018-11-17 LAB — HEMOGLOBIN A1C
Hgb A1c MFr Bld: 6.9 % — ABNORMAL HIGH (ref 4.8–5.6)
Mean Plasma Glucose: 151.33 mg/dL

## 2018-11-17 LAB — STREP PNEUMONIAE URINARY ANTIGEN: Strep Pneumo Urinary Antigen: NEGATIVE

## 2018-11-17 MED ORDER — SODIUM CHLORIDE 0.9% FLUSH: 10.0000 mL | INTRAVENOUS | Status: DC | PRN

## 2018-11-17 MED ORDER — SODIUM CHLORIDE 0.9% FLUSH
10.0000 mL | Freq: Two times a day (BID) | INTRAVENOUS | Status: DC
  Administered 2018-11-17 – 2018-11-19 (×2): 10 mL

## 2018-11-17 MED ORDER — CARVEDILOL 3.125 MG PO TABS: 3.1250 mg | ORAL_TABLET | Freq: Two times a day (BID) | ORAL | Status: DC

## 2018-11-17 MED ORDER — LISINOPRIL 5 MG PO TABS
5.0000 mg | ORAL_TABLET | Freq: Every day | ORAL | Status: DC
  Administered 2018-11-17 – 2018-11-18 (×2): 5 mg via ORAL
  Filled 2018-11-17 (×2): qty 1

## 2018-11-17 NOTE — NC FL2 (Signed)
MEDICAID FL2 LEVEL OF CARE SCREENING TOOL     IDENTIFICATION  Patient Name: Jaime [name] Birthdate: 10/14/1940 Sex: male Admission Date (Current Location): 11/16/2018  [facility] and [place] Number:  Herbalist and Address:  [facility], [place] [address] [place],  [address]      Provider Number: [id number]  Attending Physician Name and Address:  Mendel Corning, MD  Relative Name and Phone Number:  [name], spouse, [phone]    Current Level of Care: Hospital Recommended Level of Care: [place] Prior Approval Number:    Date Approved/Denied:   PASRR Number: [id number]  Discharge Plan: SNF    Current Diagnoses: Patient Active Problem List   Diagnosis Date Noted  . Acute metabolic encephalopathy 09/38/1829  . 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 11/16/2018  . Second degree Mobitz I AV block 11/12/2018  . Abnormal liver function   . Pneumonia 03/27/2018  . UTI due to Klebsiella species 11/24/2017  . Hypoglycemia   . Gram-negative bacteremia   . Cool skin 07/18/2017  . Hematuria 04/29/2017  . GERD (gastroesophageal reflux disease) 04/10/2016  . Coronary artery disease involving native coronary artery of native heart without angina pectoris 12/28/2015  . PAD (peripheral artery disease) ([place]) 12/28/2015  . Symptomatic bradycardia 08/14/2015  . Chronic systolic heart failure ([place]) 08/14/2015  . Phantom limb pain ([place]) 12/08/2014  . Syncope 11/01/2014  . Severe sepsis with septic shock ([place]) 08/18/2014  . Acute lower UTI 08/18/2014  . Elevated troponin 08/18/2014  . 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 Paraparesis of both lower limbs ([place]) 05/31/2014  . Scoliosis, thoracogenic, acquired 03/15/2014  . Mild cognitive impairment 02/11/2014  . PVD (peripheral vascular disease) ([place]) 08/13/2013  . 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 PAC (premature atrial contraction)   . Peripheral vascular disease, unspecified ([place]) 07/03/2012  . 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 Ejection fraction   . Cardiomyopathy, ischemic   . Pulmonary hypertension ([place])   . Hemiparesis ([place])   . Essential hypertension   . Carotid artery disease ([place])   . Hyperlipidemia 07/31/2007  . BPH (benign prostatic hyperplasia) 04/15/2007  . DMII (diabetes mellitus, type 2) ([place]) 12/18/2006    Orientation RESPIRATION BLADDER Height & Weight     Self, Time, Situation, Place  Normal Incontinent, External catheter Weight: 149 lb 4 oz (67.7 kg) Height:     BEHAVIORAL SYMPTOMS/MOOD NEUROLOGICAL BOWEL NUTRITION STATUS      Continent Diet(Please see DC Summary)  AMBULATORY STATUS COMMUNICATION OF NEEDS Skin   Extensive Assist Verbally Normal                       Personal Care Assistance Level of Assistance  Bathing, Feeding, Dressing Bathing Assistance: Maximum assistance Feeding assistance: Maximum assistance Dressing Assistance: Limited assistance     Functional Limitations Info  Sight, Hearing, Speech Sight Info: Adequate Hearing Info: Adequate Speech Info: Adequate    SPECIAL CARE FACTORS FREQUENCY  PT (By licensed PT), OT (By licensed OT)     PT Frequency: 5x/week OT Frequency: 3x/week            Contractures Contractures Info: Not present    Additional Factors Info  Code Status, Allergies, Insulin Sliding Scale Code Status Info: Full Allergies Info: Bee Venom, Influenza Vaccines, Atorvastatin, Latex   Insulin Sliding Scale Info: Insulin, see dc summary  Current Medications (11/17/2018):  This is the current hospital active medication list Current Facility-Administered Medications  Medication Dose Route Frequency Provider Last Rate Last Dose  . albuterol (PROVENTIL) (2.5 MG/3ML) 0.083% nebulizer solution 2.5 mg  2.5 mg Inhalation Q6H PRN Lavina Hamman, MD       . aspirin EC tablet 81 mg  81 mg Oral Daily Lavina Hamman, MD      . azithromycin (ZITHROMAX) 500 mg in sodium chloride 0.9 % 250 mL IVPB  500 mg Intravenous Q24H Lavina Hamman, MD   Stopped at 11/16/18 2154  . cefTRIAXone (ROCEPHIN) 1 g in sodium chloride 0.9 % 100 mL IVPB  1 g Intravenous Q24H Lavina Hamman, MD      . enoxaparin (LOVENOX) injection 40 mg  40 mg Subcutaneous Q24H Lavina Hamman, MD   40 mg at 11/16/18 2154  . insulin aspart (novoLOG) injection 0-5 Units  0-5 Units Subcutaneous QHS Berle Mull M, MD      . insulin aspart (novoLOG) injection 0-9 Units  0-9 Units Subcutaneous TID WC Lavina Hamman, MD      . pantoprazole (PROTONIX) EC tablet 40 mg  40 mg Oral Q lunch Lavina Hamman, MD      . pregabalin (LYRICA) capsule 150 mg  150 mg Oral q morning - 10a Lavina Hamman, MD      . pregabalin (LYRICA) capsule 300 mg  300 mg Oral QHS Lavina Hamman, MD   300 mg at 11/16/18 2155  . rosuvastatin (CRESTOR) tablet 20 mg  20 mg Oral Daily Lavina Hamman, MD      . traMADol Veatrice [place]bon) tablet 50 mg  50 mg Oral Q12H PRN Lavina Hamman, MD   50 mg at 11/17/18 0350  . vancomycin (VANCOCIN) 500 mg in sodium chloride 0.9 % 100 mL IVPB  500 mg Intravenous Q12H Romona Curls, [facility]         Discharge Medications: Please see discharge summary for a list of discharge medications.  Relevant Imaging Results:  Relevant Lab Results:   Additional Information SSN: [id number]  Lissa Morales [place], [place]

## 2018-11-17 NOTE — Progress Notes (Addendum)
FS 58, 4oz orange juice given per protocol. Recheck 69, another 4oz orange juice given. Pt down to CT scan, will recheck when he gets back. Pt alert, no difficulties swallowing noted by this RN - able to lick lip, cough on command, no wet voice after intake.  Recheck 119, will monitor

## 2018-11-17 NOTE — Progress Notes (Signed)
Pt's wife, Aakash Hollomon, called for an update. I discussed pt's progress with her. She also shared with me that "whenever he breaks out in those pus-y like bumps , it means he's close to going septic again". She also asked about the swelling around his pacer site.

## 2018-11-17 NOTE — TOC Progression Note (Signed)
Transition of Care [facility]) - Progression Note    Patient Details  Name: Jaime [name] MRN: [id number] Date of Birth: 1940/12/05  [facility] CM/SW [place], LCSW Phone Number: 11/17/2018, 2:43 PM  Clinical Narrative:    CSW spoke with patient's spouse regarding SNF. CSW presented options. She will let CSW know in the morning after she speaks with her daughter.    Expected Discharge Plan: [place] Barriers to Discharge: Continued Medical Work up  Expected Discharge Plan and Services Expected Discharge Plan: [place] In-house Referral: Clinical Social Work Discharge Planning Services: CM Consult Post Acute Care Choice: Home Health, Nursing Home Living arrangements for the past 2 months: [place]                 DME Arranged: N/A DME Agency: NA       HH Arranged: NA [place] Agency: NA         Social Determinants of Health (SDOH) Interventions    Readmission Risk Interventions No flowsheet data found.

## 2018-11-17 NOTE — Progress Notes (Signed)
[facility]                                                                              Patient Demographics  Jaime [name], is a 79 y.o. male, DOB - 06/12/1941, DJS:[id number]  Admit date - 11/16/2018   Admitting Physician Lavina Hamman, MD  Outpatient Primary MD for the patient is [name], MD  Outpatient specialists:   LOS - 1  days   Medical records reviewed and are as summarized below:    Chief Complaint  Patient presents with   Altered Mental Status   Weakness       Brief summary    Jaime [name] is a 78 y.o. 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 Patient presented to ED with complaints of generalized weakness and deconditioning.  Patient was recently seen in the hospital on 11/12/2018 for elective pacemaker implant for second-degree heart block.  The procedure went well patient was discharged on oral Coreg. After the discharge the patient remained weak and tired.  His pain was well controlled with tramadol which is his chronic medication but he has recently increased the use of secondary to the pacemaker procedure. Patient reported complaints of bilateral shoulder pain.  Progressively patient becoming more more weak and tired requiring significant assistance from wife for movement. 11/15/2018 patient was more confused and lethargic throughout the day.  Had minimal oral intake.  On the morning of 11/16/2018 patient was unresponsive and not following any commands at which time wife called EMS. In ED, patient had cough, hypoxic to 87% on RA, hypotensive.  CXR showed atelectasis versus infiltrate. COVID-19 testing was performed which was negative.  Patient was referred for admission for further treatment of his pneumonia.  Assessment & Plan    Primary problem Possible healthcare associated pneumonia - Patient  presented with shortness of breath, hypoxia, hypotension, tachypnea, chest x-ray showed mild infiltrate versus atelectasis.   - Blood cultures negative so far, cont IV Zithromax and Rocephin - COVID 19 negative   Acute metabolic encephalopathy on underlying dementia -Still confused, oriented to self, worsened possibly due to healthcare associated pneumonia, Klebsiella UTI -Lyrica dose reduced.  Avoid sedatives, narcotics.  - Cannot get MRI secondary to pacemaker.  Repeat CT head today showed no acute intracranial abnormality, extensive small vessel white matter disease and mild global volume loss. -PT evaluation recommended skilled nursing facility  Klebsiella UTI -Urine culture showed Klebsiella pneumonia, 30,000 colonies.  Patient has been treated by IV ceftriaxone for PNA, which covers UTI  Essential hypertension -Initially was hypotensive, now BP stable and improving, restart low-dose lisinopril -Continue to hold Lasix and Coreg  Type 2 diabetes mellitus, uncontrolled with PVD -Hemoglobin A1c 6.9 -Continue sliding scale insulin, continue to hold insulin 70/30  Generalized weakness, failure to thrive in the setting of progressive dementia PT recommended skilled nursing facility  Second-degree heart block with recent pacemaker implantation -Continue to hold Coreg  History of progressive multiple sclerosis -Has history of spastic paraplegia, which led to nonhealing ulcer and  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   Disposition Plan: awaiting SNF  Time Spent in minutes  10mins   Procedures:  CT head  Consultants:   None   Antimicrobials:   Anti-infectives (From admission, onward)   Start     Dose/Rate Route Frequency Ordered Stop   11/17/18 1400  cefTRIAXone (ROCEPHIN) 1 g in  sodium chloride 0.9 % 100 mL IVPB     1 g 200 mL/hr over 30 Minutes Intravenous Every 24 hours 11/16/18 1852 11/24/18 1359   11/17/18 0800  vancomycin (VANCOCIN) 500 mg in sodium chloride 0.9 % 100 mL IVPB     500 mg 100 mL/hr over 60 Minutes Intravenous Every 12 hours 11/16/18 1910     11/16/18 1915  vancomycin (VANCOCIN) 1,250 mg in sodium chloride 0.9 % 250 mL IVPB     1,250 mg 166.7 mL/hr over 90 Minutes Intravenous  Once 11/16/18 1906 11/17/18 0700   11/16/18 1900  azithromycin (ZITHROMAX) 500 mg in sodium chloride 0.9 % 250 mL IVPB     500 mg 250 mL/hr over 60 Minutes Intravenous Every 24 hours 11/16/18 1852 11/23/18 1859   11/16/18 1345  cefTRIAXone (ROCEPHIN) 1 g in sodium chloride 0.9 % 100 mL IVPB     1 g 200 mL/hr over 30 Minutes Intravenous  Once 11/16/18 1331 11/16/18 1446   11/16/18 1345  azithromycin (ZITHROMAX) 500 mg in sodium chloride 0.9 % 250 mL IVPB  Status:  Discontinued     500 mg 250 mL/hr over 60 Minutes Intravenous  Once 11/16/18 1331 11/16/18 1846          Medications  Scheduled Meds:  aspirin EC  81 mg Oral Daily   enoxaparin (LOVENOX) injection  40 mg Subcutaneous Q24H   insulin aspart  0-5 Units Subcutaneous QHS   insulin aspart  0-9 Units Subcutaneous TID WC   pantoprazole  40 mg Oral Q lunch   pregabalin  150 mg Oral q morning - 10a   pregabalin  300 mg Oral QHS   rosuvastatin  20 mg Oral Daily   Continuous Infusions:  azithromycin Stopped (11/16/18 2154)   cefTRIAXone (ROCEPHIN)  IV     vancomycin     PRN Meds:.albuterol, traMADol      Subjective:   Jaime [name] was seen and examined today.  Confused, oriented to self.  No acute issues overnight.  No fevers.  No pain. Patient denies dizziness, chest pain, shortness of breath, abdominal pain, N/V.   Objective:   Vitals:   11/16/18 1900 11/16/18 2029 11/17/18 0424 11/17/18 0426  BP: 126/72 (!) 144/82 (!) 143/78   Pulse: 60 (!) 56 (!) 59   Resp: 10 17    Temp:  98.2  F (36.8 C) 98.5 F (36.9 C)   TempSrc:  Oral Oral   SpO2: 96% 97% 98%   Weight:  66.6 kg  67.7 kg    Intake/Output Summary (Last 24 hours) at 11/17/2018 1101 Last data filed at 11/17/2018 0600 Gross per 24 hour  Intake 950 ml  Output 300 ml  Net 650 ml     Wt Readings from Last 3 Encounters:  11/17/18 67.7 kg  11/13/18 65.3 kg  03/31/18 67.1 kg     Exam  General: Alert and oriented x self has dementia  Eyes:  HEENT:  Cardiovascular: S1 S2 auscultated,Regular rate and rhythm.  Respiratory: Clear to auscultation bilaterally  Gastrointestinal: Soft, nontender, nondistended, + bowel sounds  Ext: Bilateral AKA  Neuro:   Musculoskeletal: No digital cyanosis, clubbing  Skin: No rashes  Psych: Confused   Data Reviewed:  I have personally reviewed following labs and imaging studies  Micro Results Recent Results (from the past 240 hour(s))  Blood Culture (routine x 2)     Status: None   Collection Time: 11/12/18  9:56 AM  Result Value Ref Range Status   Specimen Description BLOOD LEFT ARM  Final   Special Requests   Final    BOTTLES DRAWN AEROBIC AND ANAEROBIC Blood Culture adequate volume   Culture   Final    NO GROWTH 5 DAYS Performed at [facility], [address], [place], [place] [address]    Report Status 11/17/2018 FINAL  Final  Blood Culture (routine x 2)     Status: None   Collection Time: 11/12/18 10:04 AM  Result Value Ref Range Status   Specimen Description BLOOD RIGHT WRIST  Final   Special Requests   Final    BOTTLES DRAWN AEROBIC AND ANAEROBIC Blood Culture results may not be optimal due to an inadequate volume of blood received in culture bottles   Culture   Final    NO GROWTH 5 DAYS Performed at [facility], [place] [address], [place], [place] [address]    Report Status 11/17/2018 FINAL  Final  Urine culture     Status: Abnormal   Collection Time: 11/12/18 11:15 AM  Result Value Ref Range Status   Specimen Description  URINE, RANDOM  Final   Special Requests   Final    NONE Performed at [facility], [place] [address], [place], [place] [address]    Culture 30,000 COLONIES/mL KLEBSIELLA PNEUMONIAE (A)  Final   Report Status 11/14/2018 FINAL  Final   Organism ID, Bacteria KLEBSIELLA PNEUMONIAE (A)  Final      Susceptibility   Klebsiella pneumoniae - MIC*    AMPICILLIN >=32 RESISTANT Resistant     CEFAZOLIN <=4 SENSITIVE Sensitive     CEFTRIAXONE <=1 SENSITIVE Sensitive     CIPROFLOXACIN <=0.25 SENSITIVE Sensitive     GENTAMICIN <=1 SENSITIVE Sensitive     IMIPENEM <=0.25 SENSITIVE Sensitive     NITROFURANTOIN <=16 SENSITIVE Sensitive     TRIMETH/SULFA <=20 SENSITIVE Sensitive     AMPICILLIN/SULBACTAM 16 INTERMEDIATE Intermediate     PIP/TAZO 16 SENSITIVE Sensitive     Extended ESBL NEGATIVE Sensitive     * 30,000 COLONIES/mL KLEBSIELLA PNEUMONIAE  SARS Coronavirus 2 [facility] order, Performed in [place] hospital lab)     Status: None   Collection Time: 11/12/18 12:48 PM  Result Value Ref Range Status   SARS Coronavirus 2 NEGATIVE NEGATIVE Final    Comment: (NOTE) If result is NEGATIVE SARS-CoV-2 target nucleic acids are NOT DETECTED. The SARS-CoV-2 RNA is generally detectable in upper and lower  respiratory specimens during the acute phase of infection. The lowest  concentration of SARS-CoV-2 viral copies this assay can detect is 250  copies / mL. A negative result does not preclude SARS-CoV-2 infection  and should not be used as the sole basis for treatment or other  patient management decisions.  A negative result may occur with  improper specimen collection / handling, submission of specimen other  than nasopharyngeal swab, presence of viral mutation(s) within the  areas targeted by this assay, and  inadequate number of viral copies  (<250 copies / mL). A negative result must be combined with clinical  observations, patient history, and epidemiological information. If result is  POSITIVE SARS-CoV-2 target nucleic acids are DETECTED. The SARS-CoV-2 RNA is generally detectable in upper and lower  respiratory specimens dur ing the acute phase of infection.  Positive  results are indicative of active infection with SARS-CoV-2.  Clinical  correlation with patient history and other diagnostic information is  necessary to determine patient infection status.  Positive results do  not rule out bacterial infection or co-infection with other viruses. If result is PRESUMPTIVE POSTIVE SARS-CoV-2 nucleic acids MAY BE PRESENT.   A presumptive positive result was obtained on the submitted specimen  and confirmed on repeat testing.  While 2019 novel coronavirus  (SARS-CoV-2) nucleic acids may be present in the submitted sample  additional confirmatory testing may be necessary for epidemiological  and / or clinical management purposes  to differentiate between  SARS-CoV-2 and other Sarbecovirus currently known to infect humans.  If clinically indicated additional testing with an alternate test  methodology [phone]) is advised. The SARS-CoV-2 RNA is generally  detectable in upper and lower respiratory sp ecimens during the acute  phase of infection. The expected result is Negative. Fact Sheet for Patients:  StrictlyIdeas.no Fact Sheet for Healthcare Providers: BankingDealers.co.za This test is not yet approved or cleared by the [place] FDA and has been authorized for detection and/or diagnosis of SARS-CoV-2 by FDA under an Emergency Use Authorization (EUA).  This EUA will remain in effect (meaning this test can be used) for the duration of the COVID-19 declaration under Section 564(b)(1) of the Act, 21 U.S.C. section 360bbb-3(b)(1), unless the authorization is terminated or revoked sooner. Performed at [facility], [place] [address], [place], [place] [address]   Blood culture (routine x 2)     Status: None  (Preliminary result)   Collection Time: 11/16/18  9:59 AM  Result Value Ref Range Status   Specimen Description BLOOD SITE NOT SPECIFIED  Final   Special Requests   Final    BOTTLES DRAWN AEROBIC AND ANAEROBIC Blood Culture results may not be optimal due to an inadequate volume of blood received in culture bottles   Culture   Final    NO GROWTH < 24 HOURS Performed at [facility] [address], [place], [place] [address]    Report Status PENDING  Incomplete  Blood culture (routine x 2)     Status: None (Preliminary result)   Collection Time: 11/16/18 11:00 AM  Result Value Ref Range Status   Specimen Description BLOOD RIGHT HAND  Final   Special Requests   Final    BOTTLES DRAWN AEROBIC AND ANAEROBIC Blood Culture adequate volume   Culture   Final    NO GROWTH < 24 HOURS Performed at [facility], [place] [address], [place], [place] [address]    Report Status PENDING  Incomplete  Urine culture     Status: None   Collection Time: 11/16/18  2:10 PM  Result Value Ref Range Status   Specimen Description URINE, RANDOM  Final   Special Requests NONE  Final   Culture   Final    NO GROWTH Performed at [facility], [address], [place], [place] [address]    Report Status 11/17/2018 FINAL  Final  SARS Coronavirus 2 [facility] order, Performed in [facility] hospital lab)     Status: None  Collection Time: 11/16/18  3:00 PM  Result Value Ref Range Status   SARS Coronavirus 2 NEGATIVE NEGATIVE Final    Comment: (NOTE) If result is NEGATIVE SARS-CoV-2 target nucleic acids are NOT DETECTED. The SARS-CoV-2 RNA is generally detectable in upper and lower  respiratory specimens during the acute phase of infection. The lowest  concentration of SARS-CoV-2 viral copies this assay can detect is 250  copies / mL. A negative result does not preclude SARS-CoV-2 infection  and should not be used as the sole basis for treatment or other  patient management decisions.  A  negative result may occur with  improper specimen collection / handling, submission of specimen other  than nasopharyngeal swab, presence of viral mutation(s) within the  areas targeted by this assay, and inadequate number of viral copies  (<250 copies / mL). A negative result must be combined with clinical  observations, patient history, and epidemiological information. If result is POSITIVE SARS-CoV-2 target nucleic acids are DETECTED. The SARS-CoV-2 RNA is generally detectable in upper and lower  respiratory specimens dur ing the acute phase of infection.  Positive  results are indicative of active infection with SARS-CoV-2.  Clinical  correlation with patient history and other diagnostic information is  necessary to determine patient infection status.  Positive results do  not rule out bacterial infection or co-infection with other viruses. If result is PRESUMPTIVE POSTIVE SARS-CoV-2 nucleic acids MAY BE PRESENT.   A presumptive positive result was obtained on the submitted specimen  and confirmed on repeat testing.  While 2019 novel coronavirus  (SARS-CoV-2) nucleic acids may be present in the submitted sample  additional confirmatory testing may be necessary for epidemiological  and / or clinical management purposes  to differentiate between  SARS-CoV-2 and other Sarbecovirus currently known to infect humans.  If clinically indicated additional testing with an alternate test  methodology [phone]) is advised. The SARS-CoV-2 RNA is generally  detectable in upper and lower respiratory sp ecimens during the acute  phase of infection. The expected result is Negative. Fact Sheet for Patients:  StrictlyIdeas.no Fact Sheet for Healthcare Providers: BankingDealers.co.za This test is not yet approved or cleared by the [place] FDA and has been authorized for detection and/or diagnosis of SARS-CoV-2 by FDA under an Emergency Use  Authorization (EUA).  This EUA will remain in effect (meaning this test can be used) for the duration of the COVID-19 declaration under Section 564(b)(1) of the Act, 21 U.S.C. section 360bbb-3(b)(1), unless the authorization is terminated or revoked sooner. Performed at [facility], [place] [address], [place], [place] [address]     Radiology Reports Dg Chest 2 View  Result Date: 11/13/2018 CLINICAL DATA:  78 year old male status post pacemaker placement. EXAM: CHEST - 2 VIEW COMPARISON:  11/12/2018 and earlier. FINDINGS: AP and lateral views of the chest. Left chest triple lead transvenous cardiac pacemaker has been placed. Lead position appears appropriate. Stable cardiac size and mediastinal contours. Lower lung volumes, suspect crowding of lung markings on the left. Mild mid left lung scarring. No pneumothorax or pulmonary edema. Visualized tracheal air column is within normal limits. Negative visible bowel gas pattern. IMPRESSION: Left chest cardiac pacemaker placed with no adverse features. Electronically Signed   By: Genevie Ann M.D.   On: 11/13/2018 07:54   Ct Head Wo Contrast  Result Date: 11/17/2018 CLINICAL DATA:  AMS EXAM: CT HEAD WITHOUT CONTRAST TECHNIQUE: Contiguous axial images were obtained from the base of the skull through the vertex without intravenous  contrast. COMPARISON:  11/16/2018 FINDINGS: Brain: No evidence of acute infarction, hemorrhage, hydrocephalus, or mass lesion/mass effect. Extensive periventricular white matter hypodensity and mild global volume loss. There may be a small right-sided chronic subdural hygroma (e.g. series 5, image 23). Vascular: No hyperdense vessel or unexpected calcification. Skull: Normal. Negative for fracture or focal lesion. Sinuses/Orbits: No acute finding. Other: None. IMPRESSION: 1.  No acute intracranial pathology. 2. Extensive small-vessel white matter disease and mild global volume loss. 3. There may be a small right-sided chronic  subdural hygroma (e.g. series 5, image 23). No acute intracranial hemorrhage. Electronically Signed   By: Eddie Candle M.D.   On: 11/17/2018 08:58   Ct Head Wo Contrast  Result Date: 11/16/2018 CLINICAL DATA:  77 year old male with a history weakness and altered mental status EXAM: CT HEAD WITHOUT CONTRAST TECHNIQUE: Contiguous axial images were obtained from the base of the skull through the vertex without intravenous contrast. COMPARISON:  03/23/2012, 08/19/2006 FINDINGS: Brain: No acute intracranial hemorrhage. No midline shift or mass effect. Gray-white differentiation maintained. Redemonstration of volume loss. Patchy confluent hypodensity in the periventricular white matter, unchanged from prior. Unremarkable appearance of the ventricular system. Vascular: Dense intracranial atherosclerosis Skull: No acute fracture.  No aggressive bone lesion identified. Sinuses/Orbits: Unremarkable appearance of the orbits. Mastoid air cells clear. No middle ear effusion. No significant sinus disease. Other: None IMPRESSION: Negative for acute intracranial abnormality. Chronic microvascular ischemic disease. Electronically Signed   By: Corrie Mckusick D.O.   On: 11/16/2018 14:28   Dg Chest Portable 1 View  Result Date: 11/16/2018 CLINICAL DATA:  Cough, shortness of breath. EXAM: PORTABLE CHEST 1 VIEW COMPARISON:  Radiographs of November 13, 2018. FINDINGS: Stable cardiomediastinal silhouette. No pneumothorax or pleural effusion is noted. Left-sided pacemaker is unchanged in position. Stable left midlung subsegmental atelectasis or scarring is noted. Right lung is clear. Bony thorax is unremarkable. IMPRESSION: Stable left midlung subsegmental atelectasis or scarring. Electronically Signed   By: Marijo Conception M.D.   On: 11/16/2018 09:44   Dg Chest Port 1 View  Result Date: 11/12/2018 CLINICAL DATA:  Shortness of breath and chest pain EXAM: PORTABLE CHEST 1 VIEW COMPARISON:  March 27, 2018 FINDINGS: There is no  appreciable edema or consolidation. There is mild atelectatic change in the left mid lung. Heart is mildly enlarged with pulmonary vascularity normal. No adenopathy. There is aortic atherosclerosis. There is thoracolumbar levoscoliosis. IMPRESSION: Mild left midlung atelectasis. No edema or consolidation. Heart mildly enlarged. Aortic Atherosclerosis (ICD10-I70.0). Electronically Signed   By: Lowella Grip III M.D.   On: 11/12/2018 10:43    Lab Data:  CBC: Recent Labs  Lab 11/12/18 0939 11/12/18 1011 11/16/18 0958 11/17/18 0224  WBC 5.2  --  6.1 5.5  NEUTROABS 2.5  --  3.3 3.2  HGB 12.7* 13.3 10.7* 12.3*  HCT 39.9 39.0 34.3* 39.0  MCV 81.9  --  83.9 83.3  PLT 128*  --  120* 465*   Basic Metabolic Panel: Recent Labs  Lab 11/12/18 0939 11/12/18 1011 11/13/18 0830 11/16/18 0958 11/17/18 0224  NA 139 140 142 137 139  K 4.0 4.0 4.2 4.3 4.4  CL 107  --  111 106 107  CO2 22  --  23 24 19*  GLUCOSE 139*  --  113* 121* 80  BUN 24*  --  16 27* 19  CREATININE 0.93 0.80 0.74 0.70 0.61  CALCIUM 8.8*  --  8.9 8.5* 8.8*   GFR: CrCl cannot be calculated (Unknown ideal weight.).  Liver Function Tests: Recent Labs  Lab 11/12/18 0939 11/16/18 0958 11/17/18 0224  AST 15 19 22   ALT 11 12 11   ALKPHOS 46 43 51  BILITOT 0.6 0.7 1.0  PROT 6.8 6.2* 6.8  ALBUMIN 3.2* 2.8* 3.1*   No results for input(s): LIPASE, AMYLASE in the last 168 hours. Recent Labs  Lab 11/16/18 2006  AMMONIA 24   Coagulation Profile: No results for input(s): INR, PROTIME in the last 168 hours. Cardiac Enzymes: Recent Labs  Lab 11/12/18 0939  TROPONINI <0.03   BNP (last 3 results) No results for input(s): PROBNP in the last 8760 hours. HbA1C: Recent Labs    11/17/18 0224  HGBA1C 6.9*   CBG: Recent Labs  Lab 11/13/18 0845 11/16/18 2109 11/17/18 0804 11/17/18 0822 11/17/18 0858  GLUCAP 103* 104* 58* 69* 119*   Lipid Profile: No results for input(s): CHOL, HDL, LDLCALC, TRIG, CHOLHDL,  LDLDIRECT in the last 72 hours. Thyroid Function Tests: Recent Labs    11/16/18 2005  TSH 0.581  FREET4 0.93   Anemia Panel: Recent Labs    11/16/18 2005 11/16/18 2006  VITAMINB12 484  --   FOLATE  --  12.4   Urine analysis:    Component Value Date/Time   COLORURINE YELLOW 11/16/2018 1410   APPEARANCEUR HAZY (A) 11/16/2018 1410   LABSPEC 1.018 11/16/2018 1410   PHURINE 5.0 11/16/2018 1410   GLUCOSEU NEGATIVE 11/16/2018 1410   GLUCOSEU NEGATIVE 04/29/2017 1211   HGBUR NEGATIVE 11/16/2018 1410   BILIRUBINUR NEGATIVE 11/16/2018 1410   KETONESUR NEGATIVE 11/16/2018 1410   PROTEINUR NEGATIVE 11/16/2018 1410   UROBILINOGEN 0.2 04/29/2017 1211   NITRITE NEGATIVE 11/16/2018 1410   LEUKOCYTESUR SMALL (A) 11/16/2018 1410     Jaime [name] M.D. [facility] 11/17/2018, 11:01 AM  Pager: [phone] Between 7am to 7pm - call Pager - [phone]  After 7pm go to [email or web address] - password [email or web address]  Call night coverage person covering after 7pm

## 2018-11-17 NOTE — Telephone Encounter (Signed)
Spoke with patient to get clarification on what the note below meant. She wanted to make sure that anything information that was sent to [place] about his pacemaker be sent to cardiology. I stated understanding.

## 2018-11-18 DIAGNOSIS — Z89612 Acquired absence of left leg above knee: Secondary | ICD-10-CM

## 2018-11-18 DIAGNOSIS — Z950 Presence of cardiac pacemaker: Secondary | ICD-10-CM

## 2018-11-18 DIAGNOSIS — I739 Peripheral vascular disease, unspecified: Secondary | ICD-10-CM

## 2018-11-18 DIAGNOSIS — G35 Multiple sclerosis: Secondary | ICD-10-CM

## 2018-11-18 DIAGNOSIS — G3184 Mild cognitive impairment, so stated: Secondary | ICD-10-CM

## 2018-11-18 DIAGNOSIS — Z89611 Acquired absence of right leg above knee: Secondary | ICD-10-CM

## 2018-11-18 LAB — BASIC METABOLIC PANEL
Anion gap: 10 (ref 5–15)
BUN: 12 mg/dL (ref 8–23)
CO2: 25 mmol/L (ref 22–32)
Calcium: 8.7 mg/dL — ABNORMAL LOW (ref 8.9–10.3)
Chloride: 104 mmol/L (ref 98–111)
Creatinine, Ser: 0.55 mg/dL — ABNORMAL LOW (ref 0.61–1.24)
GFR calc Af Amer: >60 mL/min (ref >60)
GFR calc non Af Amer: >60 mL/min (ref >60)
Glucose, Bld: 86 mg/dL (ref 70–99)
Potassium: 3.8 mmol/L (ref 3.5–5.1)
Sodium: 139 mmol/L (ref 135–145)

## 2018-11-18 LAB — LEGIONELLA PNEUMOPHILA SEROGP 1 UR AG: L. pneumophila Serogp 1 Ur Ag: NEGATIVE

## 2018-11-18 LAB — CBC
HCT: 33.7 % — ABNORMAL LOW (ref 39.0–52.0)
Hemoglobin: 10.8 g/dL — ABNORMAL LOW (ref 13.0–17.0)
MCH: 26.3 pg (ref 26.0–34.0)
MCHC: 32 g/dL (ref 30.0–36.0)
MCV: 82.2 fL (ref 80.0–100.0)
Platelets: 133 10*3/uL — ABNORMAL LOW (ref 150–400)
RBC: 4.1 MIL/uL — ABNORMAL LOW (ref 4.22–5.81)
RDW: 16.3 % — ABNORMAL HIGH (ref 11.5–15.5)
WBC: 4.9 10*3/uL (ref 4.0–10.5)
nRBC: 0 % (ref 0.0–0.2)

## 2018-11-18 LAB — GLUCOSE, CAPILLARY
Glucose-Capillary: 70 mg/dL (ref 70–99)
Glucose-Capillary: 163 mg/dL — ABNORMAL HIGH (ref 70–99)
Glucose-Capillary: 161 mg/dL — ABNORMAL HIGH (ref 70–99)
Glucose-Capillary: 138 mg/dL — ABNORMAL HIGH (ref 70–99)

## 2018-11-18 LAB — MRSA PCR SCREENING: MRSA by PCR: POSITIVE — AB

## 2018-11-18 MED ORDER — CARVEDILOL 3.125 MG PO TABS
3.1250 mg | ORAL_TABLET | Freq: Two times a day (BID) | ORAL | Status: DC
  Administered 2018-11-18 – 2018-11-19 (×2): 3.125 mg via ORAL
  Filled 2018-11-18 (×3): qty 1

## 2018-11-18 MED ORDER — MUPIROCIN 2 % EX OINT
1.0000 "application " | TOPICAL_OINTMENT | Freq: Two times a day (BID) | CUTANEOUS | Status: DC
  Administered 2018-11-18 – 2018-11-19 (×2): 1 via NASAL
  Filled 2018-11-18 (×2): qty 22

## 2018-11-18 MED ORDER — CHLORHEXIDINE GLUCONATE CLOTH 2 % EX PADS
6.0000 | MEDICATED_PAD | Freq: Every day | CUTANEOUS | Status: DC
  Administered 2018-11-19: 6 via TOPICAL

## 2018-11-18 NOTE — Progress Notes (Signed)
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  He was found to be hypoxic 87% on room air, had a cough, chest x-ray concerning for infiltrate, COVID-19 testing was negative and he was admitted for lobar pneumonia  Subjective: He is doing well this morning, seems to be alert to place and situation, does not recall much in the last couple days.  Denies any shortness of breath, denies any chest pain.  Feels weaker than normal.  Assessment & Plan: Active Problems:   Pulmonary hypertension (HCC)   Essential hypertension   Peripheral vascular disease, unspecified (HCC)   S/P AKA (above knee amputation) bilateral (HCC)   Mild cognitive impairment   Multiple sclerosis, secondary progressive (HCC)   Phantom limb pain (HCC)   Pneumonia   Second degree Mobitz I AV block   Acute metabolic encephalopathy   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   Principal Problem Acute hypoxic respiratory failure due to healthcare associated pneumonia -Patient was placed on ceftriaxone and azithromycin as well as vancomycin.  Blood cultures are negative so far, discontinue vancomycin today -COVID-19 was negative  Active Problems Acute metabolic encephalopathy with underlying dementia -Oriented to place, self, mental status seems to be gradually improving this is likely in the setting of an acute infectious process with lobar pneumonia and Klebsiella UTI -Lyrica dose reduced, avoid sedatives, narcotics, cannot get MRI  secondary to pacemaker, CT scan without any acute intracranial abnormalities -PT recommended SNF, possible discharge tomorrow  Klebsiella UTI -Urine cultures on 4/23 prior to admission showed Klebsiella, already on ceftriaxone  Hypertension -Blood pressure slowly increasing, resume home Coreg  Diabetes mellitus, uncontrolled with PVD -Continue sliding scale, hold home 70/30  Generalized weakness, failure to thrive -Likely progressed as he was recently hospitalized and now back with pneumonia. PT/OT evaluations performed. SNF recommended. SNF appropriate and is felt to need rehab services to restore this patient to their prior level of function to achieve safe transition back to home care. This patient needs rehab services for at least 5 days per week and skilled nursing services daily to facilitate this transition. Rehab is being requested as the most appropriate d/c option for this patient and is NOT felt to be for custodial care as evidenced by fairly good bed to wheelchair transfer prior to prior hospitalization, now unable to do so  Second-degree heart block status post pacemaker -Continue Coreg as blood pressure now allows  History of progressive MS -No acute issues  Scheduled Meds:  aspirin EC  81 mg Oral Daily   enoxaparin (LOVENOX) injection  40 mg Subcutaneous Q24H   insulin aspart  0-5 Units Subcutaneous QHS   insulin aspart  0-9 Units Subcutaneous TID WC   lisinopril  5 mg Oral Daily   pantoprazole  40 mg Oral Q lunch   pregabalin  150 mg Oral q morning - 10a   pregabalin  300 mg Oral QHS   rosuvastatin  20 mg Oral Daily   sodium chloride flush  10-40 mL Intracatheter Q12H   Continuous Infusions:  azithromycin Stopped (11/17/18 2057)  cefTRIAXone (ROCEPHIN)  IV 1 g (11/17/18 1504)   vancomycin 500 mg (11/18/18 0928)   PRN Meds:.albuterol, sodium chloride flush, traMADol  DVT prophylaxis: Lovenox Code Status: Full code Family Communication: No family  at bedside, called wife, no answer Disposition Plan: SNF likely tomorrow  Consultants:   None   Procedures:   None   Antimicrobials:  Ceftriaxone   Azithromycin    Objective: Vitals:   11/17/18 2109 11/18/18 0408 11/18/18 0410 11/18/18 0920  BP: 130/70 (!) 148/81  (!) 181/87  Pulse: 60 (!) 59    Resp:      Temp: 99.4 F (37.4 C) 98.6 F (37 C)  98.6 F (37 C)  TempSrc: Oral Oral  Oral  SpO2: 99% 100%    Weight:   66.9 kg     Intake/Output Summary (Last 24 hours) at 11/18/2018 1014 Last data filed at 11/18/2018 0928 Gross per 24 hour  Intake 460.82 ml  Output 2000 ml  Net -1539.18 ml   Filed Weights   11/16/18 2029 11/17/18 0426 11/18/18 0410  Weight: 66.6 kg 67.7 kg 66.9 kg    Examination:  Constitutional: NAD Eyes: lids and conjunctivae normal ENMT: Mucous membranes are moist.  Respiratory: clear to auscultation bilaterally, no wheezing, no crackles. Normal respiratory effort.  Cardiovascular: Regular rate and rhythm, no murmurs / rubs / gallops. No LE edema.  Abdomen: no tenderness. Bowel sounds positive.  Musculoskeletal: no clubbing / cyanosis.  Skin: no rashes seen  Data Reviewed: I have independently reviewed following labs and imaging studies   CBC: Recent Labs  Lab 11/12/18 0939 11/12/18 1011 11/16/18 0958 11/17/18 0224 11/18/18 0336  WBC 5.2  --  6.1 5.5 4.9  NEUTROABS 2.5  --  3.3 3.2  --   HGB 12.7* 13.3 10.7* 12.3* 10.8*  HCT 39.9 39.0 34.3* 39.0 33.7*  MCV 81.9  --  83.9 83.3 82.2  PLT 128*  --  120* 108* 542*   Basic Metabolic Panel: Recent Labs  Lab 11/12/18 0939 11/12/18 1011 11/13/18 0830 11/16/18 0958 11/17/18 0224 11/18/18 0336  NA 139 140 142 137 139 139  K 4.0 4.0 4.2 4.3 4.4 3.8  CL 107  --  111 106 107 104  CO2 22  --  23 24 19* 25  GLUCOSE 139*  --  113* 121* 80 86  BUN 24*  --  16 27* 19 12  CREATININE 0.93 0.80 0.74 0.70 0.61 0.55*  CALCIUM 8.8*  --  8.9 8.5* 8.8* 8.7*   GFR: CrCl cannot be calculated  (Unknown ideal weight.). Liver Function Tests: Recent Labs  Lab 11/12/18 0939 11/16/18 0958 11/17/18 0224  AST 15 19 22   ALT 11 12 11   ALKPHOS 46 43 51  BILITOT 0.6 0.7 1.0  PROT 6.8 6.2* 6.8  ALBUMIN 3.2* 2.8* 3.1*   No results for input(s): LIPASE, AMYLASE in the last 168 hours. Recent Labs  Lab 11/16/18 2006  AMMONIA 24   Coagulation Profile: No results for input(s): INR, PROTIME in the last 168 hours. Cardiac Enzymes: Recent Labs  Lab 11/12/18 0939  TROPONINI <0.03   BNP (last 3 results) No results for input(s): PROBNP in the last 8760 hours. HbA1C: Recent Labs    11/17/18 0224  HGBA1C 6.9*   CBG: Recent Labs  Lab 11/17/18 1145 11/17/18 1645 11/17/18 2105 11/17/18 2244 11/18/18 0747  GLUCAP 180* 122* 86 85 70   Lipid Profile: No results for input(s): CHOL, HDL, LDLCALC, TRIG, CHOLHDL, LDLDIRECT in the last 72 hours.  Thyroid Function Tests: Recent Labs    11/16/18 2005  TSH 0.581  FREET4 0.93   Anemia Panel: Recent Labs    11/16/18 2005 11/16/18 2006  VITAMINB12 484  --   FOLATE  --  12.4   Urine analysis:    Component Value Date/Time   COLORURINE YELLOW 11/16/2018 1410   APPEARANCEUR HAZY (A) 11/16/2018 1410   LABSPEC 1.018 11/16/2018 1410   PHURINE 5.0 11/16/2018 1410   GLUCOSEU NEGATIVE 11/16/2018 1410   GLUCOSEU NEGATIVE 04/29/2017 1211   HGBUR NEGATIVE 11/16/2018 1410   BILIRUBINUR NEGATIVE 11/16/2018 1410   KETONESUR NEGATIVE 11/16/2018 1410   PROTEINUR NEGATIVE 11/16/2018 1410   UROBILINOGEN 0.2 04/29/2017 1211   NITRITE NEGATIVE 11/16/2018 1410   LEUKOCYTESUR SMALL (A) 11/16/2018 1410   Sepsis Labs: Invalid input(s): PROCALCITONIN, LACTICIDVEN  Recent Results (from the past 240 hour(s))  Blood Culture (routine x 2)     Status: None   Collection Time: 11/12/18  9:56 AM  Result Value Ref Range Status   Specimen Description BLOOD LEFT ARM  Final   Special Requests   Final    BOTTLES DRAWN AEROBIC AND ANAEROBIC Blood  Culture adequate volume   Culture   Final    NO GROWTH 5 DAYS Performed at [facility], [address] [place], [place] [address]    Report Status 11/17/2018 FINAL  Final  Blood Culture (routine x 2)     Status: None   Collection Time: 11/12/18 10:04 AM  Result Value Ref Range Status   Specimen Description BLOOD RIGHT WRIST  Final   Special Requests   Final    BOTTLES DRAWN AEROBIC AND ANAEROBIC Blood Culture results may not be optimal due to an inadequate volume of blood received in culture bottles   Culture   Final    NO GROWTH 5 DAYS Performed at [facility], [place] [address] [place], [place] [address]    Report Status 11/17/2018 FINAL  Final  Urine culture     Status: Abnormal   Collection Time: 11/12/18 11:15 AM  Result Value Ref Range Status   Specimen Description URINE, RANDOM  Final   Special Requests   Final    NONE Performed at [facility], [place] [address], [place], [place] [address]    Culture 30,000 COLONIES/mL KLEBSIELLA PNEUMONIAE (A)  Final   Report Status 11/14/2018 FINAL  Final   Organism ID, Bacteria KLEBSIELLA PNEUMONIAE (A)  Final      Susceptibility   Klebsiella pneumoniae - MIC*    AMPICILLIN >=32 RESISTANT Resistant     CEFAZOLIN <=4 SENSITIVE Sensitive     CEFTRIAXONE <=1 SENSITIVE Sensitive     CIPROFLOXACIN <=0.25 SENSITIVE Sensitive     GENTAMICIN <=1 SENSITIVE Sensitive     IMIPENEM <=0.25 SENSITIVE Sensitive     NITROFURANTOIN <=16 SENSITIVE Sensitive     TRIMETH/SULFA <=20 SENSITIVE Sensitive     AMPICILLIN/SULBACTAM 16 INTERMEDIATE Intermediate     PIP/TAZO 16 SENSITIVE Sensitive     Extended ESBL NEGATIVE Sensitive     * 30,000 COLONIES/mL KLEBSIELLA PNEUMONIAE  SARS Coronavirus 2 [facility] order, Performed in [place] hospital lab)     Status: None   Collection Time: 11/12/18 12:48 PM  Result Value Ref Range Status   SARS Coronavirus 2 NEGATIVE NEGATIVE Final    Comment: (NOTE) If result is NEGATIVE SARS-CoV-2  target nucleic acids are NOT DETECTED. The SARS-CoV-2 RNA is generally detectable in upper and lower  respiratory specimens during  the acute phase of infection. The lowest  concentration of SARS-CoV-2 viral copies this assay can detect is 250  copies / mL. A negative result does not preclude SARS-CoV-2 infection  and should not be used as the sole basis for treatment or other  patient management decisions.  A negative result may occur with  improper specimen collection / handling, submission of specimen other  than nasopharyngeal swab, presence of viral mutation(s) within the  areas targeted by this assay, and inadequate number of viral copies  (<250 copies / mL). A negative result must be combined with clinical  observations, patient history, and epidemiological information. If result is POSITIVE SARS-CoV-2 target nucleic acids are DETECTED. The SARS-CoV-2 RNA is generally detectable in upper and lower  respiratory specimens dur ing the acute phase of infection.  Positive  results are indicative of active infection with SARS-CoV-2.  Clinical  correlation with patient history and other diagnostic information is  necessary to determine patient infection status.  Positive results do  not rule out bacterial infection or co-infection with other viruses. If result is PRESUMPTIVE POSTIVE SARS-CoV-2 nucleic acids MAY BE PRESENT.   A presumptive positive result was obtained on the submitted specimen  and confirmed on repeat testing.  While 2019 novel coronavirus  (SARS-CoV-2) nucleic acids may be present in the submitted sample  additional confirmatory testing may be necessary for epidemiological  and / or clinical management purposes  to differentiate between  SARS-CoV-2 and other Sarbecovirus currently known to infect humans.  If clinically indicated additional testing with an alternate test  methodology [phone]) is advised. The SARS-CoV-2 RNA is generally  detectable in upper and lower  respiratory sp ecimens during the acute  phase of infection. The expected result is Negative. Fact Sheet for Patients:  StrictlyIdeas.no Fact Sheet for Healthcare Providers: BankingDealers.co.za This test is not yet approved or cleared by the [place] FDA and has been authorized for detection and/or diagnosis of SARS-CoV-2 by FDA under an Emergency Use Authorization (EUA).  This EUA will remain in effect (meaning this test can be used) for the duration of the COVID-19 declaration under Section 564(b)(1) of the Act, 21 U.S.C. section 360bbb-3(b)(1), unless the authorization is terminated or revoked sooner. Performed at [facility], [place] [address], [place], [place] [address]   Blood culture (routine x 2)     Status: None (Preliminary result)   Collection Time: 11/16/18  9:59 AM  Result Value Ref Range Status   Specimen Description BLOOD SITE NOT SPECIFIED  Final   Special Requests   Final    BOTTLES DRAWN AEROBIC AND ANAEROBIC Blood Culture results may not be optimal due to an inadequate volume of blood received in culture bottles   Culture   Final    NO GROWTH < 24 HOURS Performed at [facility] [address] [place], [place] [address]    Report Status PENDING  Incomplete  Blood culture (routine x 2)     Status: None (Preliminary result)   Collection Time: 11/16/18 11:00 AM  Result Value Ref Range Status   Specimen Description BLOOD RIGHT HAND  Final   Special Requests   Final    BOTTLES DRAWN AEROBIC AND ANAEROBIC Blood Culture adequate volume   Culture   Final    NO GROWTH < 24 HOURS Performed at [facility], [place] [address] [place], [place] [address]    Report Status PENDING  Incomplete  Urine culture     Status: None  Collection Time: 11/16/18  2:10 PM  Result Value Ref Range Status   Specimen Description URINE, RANDOM  Final   Special Requests NONE  Final   Culture   Final    NO  GROWTH Performed at [facility], [address], [place], [place] [address]    Report Status 11/17/2018 FINAL  Final  SARS Coronavirus 2 [facility] order, Performed in [facility] hospital lab)     Status: None   Collection Time: 11/16/18  3:00 PM  Result Value Ref Range Status   SARS Coronavirus 2 NEGATIVE NEGATIVE Final    Comment: (NOTE) If result is NEGATIVE SARS-CoV-2 target nucleic acids are NOT DETECTED. The SARS-CoV-2 RNA is generally detectable in upper and lower  respiratory specimens during the acute phase of infection. The lowest  concentration of SARS-CoV-2 viral copies this assay can detect is 250  copies / mL. A negative result does not preclude SARS-CoV-2 infection  and should not be used as the sole basis for treatment or other  patient management decisions.  A negative result may occur with  improper specimen collection / handling, submission of specimen other  than nasopharyngeal swab, presence of viral mutation(s) within the  areas targeted by this assay, and inadequate number of viral copies  (<250 copies / mL). A negative result must be combined with clinical  observations, patient history, and epidemiological information. If result is POSITIVE SARS-CoV-2 target nucleic acids are DETECTED. The SARS-CoV-2 RNA is generally detectable in upper and lower  respiratory specimens dur ing the acute phase of infection.  Positive  results are indicative of active infection with SARS-CoV-2.  Clinical  correlation with patient history and other diagnostic information is  necessary to determine patient infection status.  Positive results do  not rule out bacterial infection or co-infection with other viruses. If result is PRESUMPTIVE POSTIVE SARS-CoV-2 nucleic acids MAY BE PRESENT.   A presumptive positive result was obtained on the submitted specimen  and confirmed on repeat testing.  While 2019 novel coronavirus  (SARS-CoV-2) nucleic acids may be present in the  submitted sample  additional confirmatory testing may be necessary for epidemiological  and / or clinical management purposes  to differentiate between  SARS-CoV-2 and other Sarbecovirus currently known to infect humans.  If clinically indicated additional testing with an alternate test  methodology [phone]) is advised. The SARS-CoV-2 RNA is generally  detectable in upper and lower respiratory sp ecimens during the acute  phase of infection. The expected result is Negative. Fact Sheet for Patients:  StrictlyIdeas.no Fact Sheet for Healthcare Providers: BankingDealers.co.za This test is not yet approved or cleared by the [place] FDA and has been authorized for detection and/or diagnosis of SARS-CoV-2 by FDA under an Emergency Use Authorization (EUA).  This EUA will remain in effect (meaning this test can be used) for the duration of the COVID-19 declaration under Section 564(b)(1) of the Act, 21 U.S.C. section 360bbb-3(b)(1), unless the authorization is terminated or revoked sooner. Performed at [facility], [place] [address] [place], [place] [address]       Radiology Studies: Ct Head Wo Contrast  Result Date: 11/17/2018 CLINICAL DATA:  AMS EXAM: CT HEAD WITHOUT CONTRAST TECHNIQUE: Contiguous axial images were obtained from the base of the skull through the vertex without intravenous contrast. COMPARISON:  11/16/2018 FINDINGS: Brain: No evidence of acute infarction, hemorrhage, hydrocephalus, or mass lesion/mass effect. Extensive periventricular white matter hypodensity and mild global volume loss. There may be a small right-sided  chronic subdural hygroma (e.g. series 5, image 23). Vascular: No hyperdense vessel or unexpected calcification. Skull: Normal. Negative for fracture or focal lesion. Sinuses/Orbits: No acute finding. Other: None. IMPRESSION: 1.  No acute intracranial pathology. 2. Extensive small-vessel white matter  disease and mild global volume loss. 3. There may be a small right-sided chronic subdural hygroma (e.g. series 5, image 23). No acute intracranial hemorrhage. Electronically Signed   By: Eddie Candle M.D.   On: 11/17/2018 08:58   Ct Head Wo Contrast  Result Date: 11/16/2018 CLINICAL DATA:  78 year old male with a history weakness and altered mental status EXAM: CT HEAD WITHOUT CONTRAST TECHNIQUE: Contiguous axial images were obtained from the base of the skull through the vertex without intravenous contrast. COMPARISON:  03/23/2012, 08/19/2006 FINDINGS: Brain: No acute intracranial hemorrhage. No midline shift or mass effect. Gray-white differentiation maintained. Redemonstration of volume loss. Patchy confluent hypodensity in the periventricular white matter, unchanged from prior. Unremarkable appearance of the ventricular system. Vascular: Dense intracranial atherosclerosis Skull: No acute fracture.  No aggressive bone lesion identified. Sinuses/Orbits: Unremarkable appearance of the orbits. Mastoid air cells clear. No middle ear effusion. No significant sinus disease. Other: None IMPRESSION: Negative for acute intracranial abnormality. Chronic microvascular ischemic disease. Electronically Signed   By: Corrie Mckusick D.O.   On: 11/16/2018 14:28    Marzetta Board, MD, PhD [facility]  Contact via  [email or web address]  Terry P: [phone]  F: [phone]

## 2018-11-18 NOTE — Plan of Care (Signed)
  Problem: Health Behavior/Discharge Planning: Goal: Ability to manage health-related needs will improve Outcome: Progressing   Problem: Clinical Measurements: Goal: Ability to maintain clinical measurements within normal limits will improve Outcome: Progressing Goal: Will remain free from infection Outcome: Progressing Goal: Diagnostic test results will improve Outcome: Progressing Goal: Respiratory complications will improve Outcome: Progressing Goal: Cardiovascular complication will be avoided Outcome: Progressing   Problem: Activity: Goal: Risk for activity intolerance will decrease Outcome: Progressing   Problem: Nutrition: Goal: Adequate nutrition will be maintained Outcome: Progressing   Problem: Coping: Goal: Level of anxiety will decrease Outcome: Progressing   Problem: Elimination: Goal: Will not experience complications related to bowel motility Outcome: Progressing Goal: Will not experience complications related to urinary retention Outcome: Progressing   Problem: Pain Managment: Goal: General experience of comfort will improve Outcome: Progressing   Problem: Safety: Goal: Ability to remain free from injury will improve Outcome: Progressing   Problem: Skin Integrity: Goal: Risk for impaired skin integrity will decrease Outcome: Progressing   Problem: Education: Goal: Knowledge of General Education information will improve Description Including pain rating scale, medication(s)/side effects and non-pharmacologic comfort measures Outcome: Not Progressing  Some confusion this shift. Re-oriented

## 2018-11-19 ENCOUNTER — Telehealth: Payer: Self-pay | Admitting: Neurology

## 2018-11-19 ENCOUNTER — Telehealth: Payer: Self-pay | Admitting: Internal Medicine

## 2018-11-19 DIAGNOSIS — R279 Unspecified lack of coordination: Secondary | ICD-10-CM | POA: Diagnosis not present

## 2018-11-19 DIAGNOSIS — M6281 Muscle weakness (generalized): Secondary | ICD-10-CM | POA: Diagnosis not present

## 2018-11-19 DIAGNOSIS — Z89612 Acquired absence of left leg above knee: Secondary | ICD-10-CM | POA: Diagnosis not present

## 2018-11-19 DIAGNOSIS — G546 Phantom limb syndrome with pain: Secondary | ICD-10-CM | POA: Diagnosis not present

## 2018-11-19 DIAGNOSIS — F05 Delirium due to known physiological condition: Secondary | ICD-10-CM | POA: Diagnosis not present

## 2018-11-19 DIAGNOSIS — E1169 Type 2 diabetes mellitus with other specified complication: Secondary | ICD-10-CM | POA: Diagnosis not present

## 2018-11-19 DIAGNOSIS — Z89611 Acquired absence of right leg above knee: Secondary | ICD-10-CM | POA: Diagnosis not present

## 2018-11-19 DIAGNOSIS — G35 Multiple sclerosis: Secondary | ICD-10-CM | POA: Diagnosis not present

## 2018-11-19 DIAGNOSIS — J189 Pneumonia, unspecified organism: Secondary | ICD-10-CM | POA: Diagnosis not present

## 2018-11-19 DIAGNOSIS — I272 Pulmonary hypertension, unspecified: Secondary | ICD-10-CM

## 2018-11-19 DIAGNOSIS — R5383 Other fatigue: Secondary | ICD-10-CM | POA: Diagnosis not present

## 2018-11-19 DIAGNOSIS — F0390 Unspecified dementia without behavioral disturbance: Secondary | ICD-10-CM | POA: Diagnosis not present

## 2018-11-19 DIAGNOSIS — K219 Gastro-esophageal reflux disease without esophagitis: Secondary | ICD-10-CM | POA: Diagnosis not present

## 2018-11-19 DIAGNOSIS — I255 Ischemic cardiomyopathy: Secondary | ICD-10-CM | POA: Diagnosis not present

## 2018-11-19 DIAGNOSIS — I441 Atrioventricular block, second degree: Secondary | ICD-10-CM | POA: Diagnosis not present

## 2018-11-19 DIAGNOSIS — Z743 Need for continuous supervision: Secondary | ICD-10-CM | POA: Diagnosis not present

## 2018-11-19 DIAGNOSIS — G8220 Paraplegia, unspecified: Secondary | ICD-10-CM | POA: Diagnosis not present

## 2018-11-19 DIAGNOSIS — I5022 Chronic systolic (congestive) heart failure: Secondary | ICD-10-CM | POA: Diagnosis not present

## 2018-11-19 DIAGNOSIS — R05 Cough: Secondary | ICD-10-CM | POA: Diagnosis not present

## 2018-11-19 DIAGNOSIS — G3184 Mild cognitive impairment, so stated: Secondary | ICD-10-CM | POA: Diagnosis not present

## 2018-11-19 DIAGNOSIS — R0989 Other specified symptoms and signs involving the circulatory and respiratory systems: Secondary | ICD-10-CM | POA: Diagnosis not present

## 2018-11-19 DIAGNOSIS — R5381 Other malaise: Secondary | ICD-10-CM | POA: Diagnosis not present

## 2018-11-19 DIAGNOSIS — I739 Peripheral vascular disease, unspecified: Secondary | ICD-10-CM | POA: Diagnosis not present

## 2018-11-19 DIAGNOSIS — G9341 Metabolic encephalopathy: Secondary | ICD-10-CM | POA: Diagnosis not present

## 2018-11-19 DIAGNOSIS — N390 Urinary tract infection, site not specified: Secondary | ICD-10-CM | POA: Diagnosis not present

## 2018-11-19 DIAGNOSIS — J069 Acute upper respiratory infection, unspecified: Secondary | ICD-10-CM | POA: Diagnosis not present

## 2018-11-19 DIAGNOSIS — R404 Transient alteration of awareness: Secondary | ICD-10-CM | POA: Diagnosis not present

## 2018-11-19 DIAGNOSIS — Z950 Presence of cardiac pacemaker: Secondary | ICD-10-CM | POA: Diagnosis not present

## 2018-11-19 DIAGNOSIS — I10 Essential (primary) hypertension: Secondary | ICD-10-CM | POA: Diagnosis not present

## 2018-11-19 DIAGNOSIS — R531 Weakness: Secondary | ICD-10-CM | POA: Diagnosis not present

## 2018-11-19 LAB — GLUCOSE, CAPILLARY
Glucose-Capillary: 104 mg/dL — ABNORMAL HIGH (ref 70–99)
Glucose-Capillary: 173 mg/dL — ABNORMAL HIGH (ref 70–99)

## 2018-11-19 MED ORDER — CEFDINIR 300 MG PO CAPS: 300.0000 mg | ORAL_CAPSULE | Freq: Two times a day (BID) | ORAL | 0 refills | Status: DC

## 2018-11-19 MED ORDER — TRAMADOL HCL 50 MG PO TABS: 50.0000 mg | ORAL_TABLET | ORAL | 0 refills | Status: DC | PRN

## 2018-11-19 MED ORDER — AZITHROMYCIN 500 MG PO TABS: 500.0000 mg | ORAL_TABLET | Freq: Every day | ORAL | Status: DC

## 2018-11-19 MED ORDER — LISINOPRIL 20 MG PO TABS
20.0000 mg | ORAL_TABLET | Freq: Every day | ORAL | Status: DC
  Administered 2018-11-19: 20 mg via ORAL
  Filled 2018-11-19: qty 1

## 2018-11-19 NOTE — Progress Notes (Signed)
1227:  Report to nurse Tameka at [facility]  1400: Patient discharged. Personally items given to [facility]

## 2018-11-19 NOTE — Discharge Summary (Signed)
Physician Discharge Summary  Jaime [name] NOM:[id number] DOB: 1940/09/04 DOA: 11/16/2018  PCP: [name], MD  Admit date: 11/16/2018 Discharge date: 11/19/2018  Admitted From: home Disposition:  SNF  Recommendations for Outpatient Follow-up:  1. Follow up with PCP in 1-2 weeks 2. Continue Omnicef for 5 more days  Home Health: none Equipment/Devices: none  Discharge Condition: stable CODE STATUS: Full code Diet recommendation: regular  HPI: Per admitting MD, Jaime [name] is a 78 y.o. 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 Patient presents to the hospital with complaints of generalized weakness and deconditioning.  Patient was recently seen in the hospital on 11/12/2018 for elective pacemaker implant for second-degree heart block.  The procedure went well patient was discharged on oral Coreg. After the discharge the patient remained weak and tired.  His pain was well controlled with tramadol which is his chronic medication but he has recently increased the use of secondary to the pacemaker procedure. Patient reported complaints of bilateral shoulder pain.  Progressively patient becoming more more weak and tired requiring significant assistance from wife for movement. 11/15/2018 patient was more confused and lethargic throughout the day.  Had minimal oral intake.  On the morning of 11/16/2018 patient was unresponsive and not following any commands at which time wife called EMS. At the time of my evaluation patient continues to complain of left shoulder pain, thought that he was in the bar, was able to tell me his name but not his date of birth.  Denies any nausea vomiting abdominal pain.  No chest pain other than the pain at the site of the pacemaker insertion.  No headache no dizziness no lightheadedness.  Did have some shortness of breath but no  cough during the interview ED Course: On arrival patient had cough, hypoxic to 87% on room air hypotensive.  Chest x-ray showed atelectasis versus infiltrate.  With patient reading some of sirs criteria and cough and shortness of breath COVID-19 testing was performed which was negative.  Patient was referred for admission for further treatment of his pneumonia. At his baseline ambulates with assistance And is dependent for most of his ADL; does not manages his medication on his own.  Hospital Course: Principal Problem Acute hypoxic respiratory failure due to healthcare associated pneumonia -on admission patient was started on broad-spectrum antibiotics, responded well, and eventually transitioned to [facility].  He will finish an additional 5-day treatment for a total of 7-day course.  His respiratory status is stable, he is on room air and breathing without difficulties.  Of note, COVID-19 was negative  Active Problems Acute metabolic encephalopathy with underlying dementia -Oriented to place, self, mental status seems to be gradually improving this is likely in the setting of an acute infectious process with lobar pneumonia and Klebsiella UTI. Avoid sedatives, narcotics, cannot get MRI brain secondary to pacemaker, CT scan without any acute intracranial abnormalities Klebsiella UTI -Urine cultures on 4/23 prior to admission showed Klebsiella, already treated Hypertension -continue Coreg and lisinopril Diabetes mellitus, uncontrolled with PVD -continue home regimen  Generalized weakness, failure to thrive -short term rehab Second-degree heart block status post pacemaker -stable History of progressive MS -No acute issues  Discharge Diagnoses:  Active Problems:   Pulmonary hypertension (HCC)   Essential hypertension   Peripheral vascular disease, unspecified (HCC)   S/P AKA (above knee amputation) bilateral (HCC)   Mild cognitive  impairment   Multiple sclerosis, secondary progressive (HCC)    Phantom limb pain (HCC)   Pneumonia   Second degree Mobitz I AV block   Acute metabolic encephalopathy   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     Discharge Instructions  Discharge Instructions    Face-to-face encounter (required for Medicare/Medicaid patients)   Complete by:  As directed    I Shelby certify that this patient is under my care and that I, or a nurse practitioner or physician's assistant working with me, had a face-to-face encounter that meets the physician face-to-face encounter requirements with this patient on 11/16/2018. The encounter with the patient was in whole, or in part for the following medical condition(s) which is the primary reason for home health care (List medical condition): pt is s/p bilat AKA and is having increased weakness and requiring more assistance with ADLs at home.   The encounter with the patient was in whole, or in part, for the following medical condition, which is the primary reason for home health care:  generalized weakness, nonambulatory   I certify that, based on my findings, the following services are medically necessary home health services:   Nursing Physical therapy     Reason for Medically Necessary Home Health Services:  Therapy- Home Adaptation to Facilitate Safety   My clinical findings support the need for the above services:  Bedbound   Further, I certify that my clinical findings support that this patient is homebound due to:  Unable to leave home safely without assistance   Home Health   Complete by:  As directed    To provide the following care/treatments:   PT [place] work RN       Allergies as of 11/19/2018      Reactions   Bee Venom Anaphylaxis   Influenza Vaccines Other (See Comments)   Made patient "sick for months"   Atorvastatin Other (See Comments)   Severe pain in stumps   Latex Rash      Medication List    TAKE these medications   albuterol 108 (90  Base) MCG/ACT inhaler Commonly known as:  VENTOLIN HFA Inhale 2 puffs into the lungs every 6 (six) hours as needed for wheezing or shortness of breath.   aspirin EC 81 MG tablet Take 1 tablet (81 mg total) by mouth daily.   azelastine 0.1 % nasal spray Commonly known as:  ASTELIN Place 2 sprays into both nostrils at bedtime as needed for rhinitis. Use in each nostril as directed   baclofen 10 MG tablet Commonly known as:  LIORESAL Take 0.5 tablets (5 mg total) by mouth 2 (two) times daily.   carvedilol 3.125 MG tablet Commonly known as:  Coreg Take 1 tablet (3.125 mg total) by mouth 2 (two) times daily with a meal.   cefdinir 300 MG capsule Commonly known as:  OMNICEF Take 1 capsule (300 mg total) by mouth 2 (two) times daily.   cetirizine 10 MG tablet Commonly known as:  ZYRTEC Take 10 mg by mouth daily.   diclofenac sodium 1 % Gel Commonly known as:  VOLTAREN Apply 1 application topically 2 (two) times daily as needed (for pain).   furosemide 20 MG tablet Commonly known as:  LASIX Take 1 tablet (20 mg total) by mouth daily as needed for fluid or edema (or shortness of breath).   GlucaGen 1 MG Solr injection Generic drug:  glucagon Inject 1 mg into the muscle once  as needed for low blood sugar. Reported on 11/16/2015   guaiFENesin 100 MG/5ML Soln Commonly known as:  ROBITUSSIN Take 5 mLs by mouth every 4 (four) hours as needed for cough or to loosen phlegm.   insulin aspart protamine - aspart (70-30) 100 UNIT/ML FlexPen Commonly known as:  NOVOLOG 70/30 MIX Inject 0.05 mLs (5 Units total) into the skin 2 (two) times daily. What changed:    when to take this  reasons to take this   lisinopril 10 MG tablet Commonly known as:  ZESTRIL TAKE 1 TABLET BY MOUTH EVERY DAY   multivitamin with minerals tablet Take 1 tablet by mouth daily.   nicotine 14 mg/24hr patch Commonly known as:  NICODERM CQ - dosed in mg/24 hours Place 1 patch (14 mg total) onto the skin  daily.   pantoprazole 40 MG tablet Commonly known as:  PROTONIX Take 1 tablet (40 mg total) by mouth daily with lunch.   pregabalin 150 MG capsule Commonly known as:  LYRICA Take 150-300 mg by mouth See admin instructions. Take one capsule (150 mg) by mouth in the morning and two capsules (300 mg) at bedtime   rosuvastatin 20 MG tablet Commonly known as:  CRESTOR TAKE 1 TABLET(20 MG) BY MOUTH DAILY What changed:  See the new instructions.   SYSTANE BALANCE OP Place 1-2 drops into both eyes daily as needed (dry eyes).   traMADol 50 MG tablet Commonly known as:  ULTRAM Take 1 tablet (50 mg total) by mouth every 4 (four) hours as needed (for pain).   traZODone 100 MG tablet Commonly known as:  DESYREL Take 100 mg by mouth at bedtime as needed for sleep.   Vitamin D-3 25 MCG (1000 UT) Caps Take 2,000 Units by mouth daily.      Contact information for after-discharge care    Destination    [facility] Preferred SNF .   Service:  Skilled Nursing Contact information: [address] [place] [phone]              Consultations:  None   Procedures/Studies:  Dg Chest 2 View  Result Date: 11/13/2018 CLINICAL DATA:  78 year old male status post pacemaker placement. EXAM: CHEST - 2 VIEW COMPARISON:  11/12/2018 and earlier. FINDINGS: AP and lateral views of the chest. Left chest triple lead transvenous cardiac pacemaker has been placed. Lead position appears appropriate. Stable cardiac size and mediastinal contours. Lower lung volumes, suspect crowding of lung markings on the left. Mild mid left lung scarring. No pneumothorax or pulmonary edema. Visualized tracheal air column is within normal limits. Negative visible bowel gas pattern. IMPRESSION: Left chest cardiac pacemaker placed with no adverse features. Electronically Signed   By: Genevie Ann M.D.   On: 11/13/2018 07:54   Ct Head Wo Contrast  Result Date: 11/17/2018 CLINICAL  DATA:  AMS EXAM: CT HEAD WITHOUT CONTRAST TECHNIQUE: Contiguous axial images were obtained from the base of the skull through the vertex without intravenous contrast. COMPARISON:  11/16/2018 FINDINGS: Brain: No evidence of acute infarction, hemorrhage, hydrocephalus, or mass lesion/mass effect. Extensive periventricular white matter hypodensity and mild global volume loss. There may be a small right-sided chronic subdural hygroma (e.g. series 5, image 23). Vascular: No hyperdense vessel or unexpected calcification. Skull: Normal. Negative for fracture or focal lesion. Sinuses/Orbits: No acute finding. Other: None. IMPRESSION: 1.  No acute intracranial pathology. 2. Extensive small-vessel white matter disease and mild global volume loss. 3. There may be a small right-sided chronic  subdural hygroma (e.g. series 5, image 23). No acute intracranial hemorrhage. Electronically Signed   By: Eddie Candle M.D.   On: 11/17/2018 08:58   Ct Head Wo Contrast  Result Date: 11/16/2018 CLINICAL DATA:  78 year old male with a history weakness and altered mental status EXAM: CT HEAD WITHOUT CONTRAST TECHNIQUE: Contiguous axial images were obtained from the base of the skull through the vertex without intravenous contrast. COMPARISON:  03/23/2012, 08/19/2006 FINDINGS: Brain: No acute intracranial hemorrhage. No midline shift or mass effect. Gray-white differentiation maintained. Redemonstration of volume loss. Patchy confluent hypodensity in the periventricular white matter, unchanged from prior. Unremarkable appearance of the ventricular system. Vascular: Dense intracranial atherosclerosis Skull: No acute fracture.  No aggressive bone lesion identified. Sinuses/Orbits: Unremarkable appearance of the orbits. Mastoid air cells clear. No middle ear effusion. No significant sinus disease. Other: None IMPRESSION: Negative for acute intracranial abnormality. Chronic microvascular ischemic disease. Electronically Signed   By: Corrie Mckusick D.O.   On: 11/16/2018 14:28   Dg Chest Portable 1 View  Result Date: 11/16/2018 CLINICAL DATA:  Cough, shortness of breath. EXAM: PORTABLE CHEST 1 VIEW COMPARISON:  Radiographs of November 13, 2018. FINDINGS: Stable cardiomediastinal silhouette. No pneumothorax or pleural effusion is noted. Left-sided pacemaker is unchanged in position. Stable left midlung subsegmental atelectasis or scarring is noted. Right lung is clear. Bony thorax is unremarkable. IMPRESSION: Stable left midlung subsegmental atelectasis or scarring. Electronically Signed   By: Marijo Conception M.D.   On: 11/16/2018 09:44   Dg Chest Port 1 View  Result Date: 11/12/2018 CLINICAL DATA:  Shortness of breath and chest pain EXAM: PORTABLE CHEST 1 VIEW COMPARISON:  March 27, 2018 FINDINGS: There is no appreciable edema or consolidation. There is mild atelectatic change in the left mid lung. Heart is mildly enlarged with pulmonary vascularity normal. No adenopathy. There is aortic atherosclerosis. There is thoracolumbar levoscoliosis. IMPRESSION: Mild left midlung atelectasis. No edema or consolidation. Heart mildly enlarged. Aortic Atherosclerosis (ICD10-I70.0). Electronically Signed   By: Lowella Grip III M.D.   On: 11/12/2018 10:43      Subjective: - no chest pain, shortness of breath, no abdominal pain, nausea or vomiting.   Discharge Exam: BP 126/81    Pulse 77    Temp 98.1 F (36.7 C) (Oral)    Resp 16    Wt 66.1 kg    SpO2 97%    BMI 20.32 kg/m   General: Pt is alert, awake, not in acute distress Cardiovascular: RRR, S1/S2 +, no rubs, no gallops Respiratory: CTA bilaterally, no wheezing, no rhonchi Abdominal: Soft, NT, ND, bowel sounds + Extremities: no edema, no cyanosis    The results of significant diagnostics from this hospitalization (including imaging, microbiology, ancillary and laboratory) are listed below for reference.     Microbiology: Recent Results (from the past 240 hour(s))  Blood  Culture (routine x 2)     Status: None   Collection Time: 11/12/18  9:56 AM  Result Value Ref Range Status   Specimen Description BLOOD LEFT ARM  Final   Special Requests   Final    BOTTLES DRAWN AEROBIC AND ANAEROBIC Blood Culture adequate volume   Culture   Final    NO GROWTH 5 DAYS Performed at [facility], [address] [place], [place] [address]    Report Status 11/17/2018 FINAL  Final  Blood Culture (routine x 2)     Status: None   Collection Time: 11/12/18 10:04 AM  Result Value Ref Range  Status   Specimen Description BLOOD RIGHT WRIST  Final   Special Requests   Final    BOTTLES DRAWN AEROBIC AND ANAEROBIC Blood Culture results may not be optimal due to an inadequate volume of blood received in culture bottles   Culture   Final    NO GROWTH 5 DAYS Performed at [facility], [place] [address] [address] [place], [place] [address]    Report Status 11/17/2018 FINAL  Final  Urine culture     Status: Abnormal   Collection Time: 11/12/18 11:15 AM  Result Value Ref Range Status   Specimen Description URINE, RANDOM  Final   Special Requests   Final    NONE Performed at [facility], [place] [address] [place], [place] [address]    Culture 30,000 COLONIES/mL KLEBSIELLA PNEUMONIAE (A)  Final   Report Status 11/14/2018 FINAL  Final   Organism ID, Bacteria KLEBSIELLA PNEUMONIAE (A)  Final      Susceptibility   Klebsiella pneumoniae - MIC*    AMPICILLIN >=32 RESISTANT Resistant     CEFAZOLIN <=4 SENSITIVE Sensitive     CEFTRIAXONE <=1 SENSITIVE Sensitive     CIPROFLOXACIN <=0.25 SENSITIVE Sensitive     GENTAMICIN <=1 SENSITIVE Sensitive     IMIPENEM <=0.25 SENSITIVE Sensitive     NITROFURANTOIN <=16 SENSITIVE Sensitive     TRIMETH/SULFA <=20 SENSITIVE Sensitive     AMPICILLIN/SULBACTAM 16 INTERMEDIATE Intermediate     PIP/TAZO 16 SENSITIVE Sensitive     Extended ESBL NEGATIVE Sensitive     * 30,000 COLONIES/mL KLEBSIELLA PNEUMONIAE  SARS Coronavirus 2 [facility] order,  Performed in [place] hospital lab)     Status: None   Collection Time: 11/12/18 12:48 PM  Result Value Ref Range Status   SARS Coronavirus 2 NEGATIVE NEGATIVE Final    Comment: (NOTE) If result is NEGATIVE SARS-CoV-2 target nucleic acids are NOT DETECTED. The SARS-CoV-2 RNA is generally detectable in upper and lower  respiratory specimens during the acute phase of infection. The lowest  concentration of SARS-CoV-2 viral copies this assay can detect is 250  copies / mL. A negative result does not preclude SARS-CoV-2 infection  and should not be used as the sole basis for treatment or other  patient management decisions.  A negative result may occur with  improper specimen collection / handling, submission of specimen other  than nasopharyngeal swab, presence of viral mutation(s) within the  areas targeted by this assay, and inadequate number of viral copies  (<250 copies / mL). A negative result must be combined with clinical  observations, patient history, and epidemiological information. If result is POSITIVE SARS-CoV-2 target nucleic acids are DETECTED. The SARS-CoV-2 RNA is generally detectable in upper and lower  respiratory specimens dur ing the acute phase of infection.  Positive  results are indicative of active infection with SARS-CoV-2.  Clinical  correlation with patient history and other diagnostic information is  necessary to determine patient infection status.  Positive results do  not rule out bacterial infection or co-infection with other viruses. If result is PRESUMPTIVE POSTIVE SARS-CoV-2 nucleic acids MAY BE PRESENT.   A presumptive positive result was obtained on the submitted specimen  and confirmed on repeat testing.  While 2019 novel coronavirus  (SARS-CoV-2) nucleic acids may be present in the submitted sample  additional confirmatory testing may be necessary for epidemiological  and / or clinical management purposes  to differentiate between  SARS-CoV-2  and other Sarbecovirus currently known to infect humans.  If  clinically indicated additional testing with an alternate test  methodology [phone]) is advised. The SARS-CoV-2 RNA is generally  detectable in upper and lower respiratory sp ecimens during the acute  phase of infection. The expected result is Negative. Fact Sheet for Patients:  StrictlyIdeas.no Fact Sheet for Healthcare Providers: BankingDealers.co.za This test is not yet approved or cleared by the [place] FDA and has been authorized for detection and/or diagnosis of SARS-CoV-2 by FDA under an Emergency Use Authorization (EUA).  This EUA will remain in effect (meaning this test can be used) for the duration of the COVID-19 declaration under Section 564(b)(1) of the Act, 21 U.S.C. section 360bbb-3(b)(1), unless the authorization is terminated or revoked sooner. Performed at [facility], [place] [address] [place], [place] [address]   Blood culture (routine x 2)     Status: None (Preliminary result)   Collection Time: 11/16/18  9:59 AM  Result Value Ref Range Status   Specimen Description BLOOD SITE NOT SPECIFIED  Final   Special Requests   Final    BOTTLES DRAWN AEROBIC AND ANAEROBIC Blood Culture results may not be optimal due to an inadequate volume of blood received in culture bottles   Culture   Final    NO GROWTH 3 DAYS Performed at [facility], [place] [address] [place], [place] [address]    Report Status PENDING  Incomplete  Blood culture (routine x 2)     Status: None (Preliminary result)   Collection Time: 11/16/18 11:00 AM  Result Value Ref Range Status   Specimen Description BLOOD RIGHT HAND  Final   Special Requests   Final    BOTTLES DRAWN AEROBIC AND ANAEROBIC Blood Culture adequate volume   Culture   Final    NO GROWTH 3 DAYS Performed at [facility], [place] [address] [place], [place] [address]    Report Status PENDING  Incomplete    Urine culture     Status: None   Collection Time: 11/16/18  2:10 PM  Result Value Ref Range Status   Specimen Description URINE, RANDOM  Final   Special Requests NONE  Final   Culture   Final    NO GROWTH Performed at [facility], [address] [place], [place] [address]    Report Status 11/17/2018 FINAL  Final  SARS Coronavirus 2 [facility] order, Performed in [place] hospital lab)     Status: None   Collection Time: 11/16/18  3:00 PM  Result Value Ref Range Status   SARS Coronavirus 2 NEGATIVE NEGATIVE Final    Comment: (NOTE) If result is NEGATIVE SARS-CoV-2 target nucleic acids are NOT DETECTED. The SARS-CoV-2 RNA is generally detectable in upper and lower  respiratory specimens during the acute phase of infection. The lowest  concentration of SARS-CoV-2 viral copies this assay can detect is 250  copies / mL. A negative result does not preclude SARS-CoV-2 infection  and should not be used as the sole basis for treatment or other  patient management decisions.  A negative result may occur with  improper specimen collection / handling, submission of specimen other  than nasopharyngeal swab, presence of viral mutation(s) within the  areas targeted by this assay, and inadequate number of viral copies  (<250 copies / mL). A negative result must be combined with clinical  observations, patient history, and epidemiological information. If result is POSITIVE SARS-CoV-2 target nucleic acids are DETECTED. The SARS-CoV-2 RNA is generally detectable in upper and lower  respiratory specimens dur ing the acute phase of infection.  Positive  results are indicative of active infection with SARS-CoV-2.  Clinical  correlation with patient history and other diagnostic information is  necessary to determine patient infection status.  Positive results do  not rule out bacterial infection or co-infection with other viruses. If result is PRESUMPTIVE POSTIVE SARS-CoV-2 nucleic acids  MAY BE PRESENT.   A presumptive positive result was obtained on the submitted specimen  and confirmed on repeat testing.  While 2019 novel coronavirus  (SARS-CoV-2) nucleic acids may be present in the submitted sample  additional confirmatory testing may be necessary for epidemiological  and / or clinical management purposes  to differentiate between  SARS-CoV-2 and other Sarbecovirus currently known to infect humans.  If clinically indicated additional testing with an alternate test  methodology [phone] is advised. The SARS-CoV-2 RNA is generally  detectable in upper and lower respiratory sp ecimens during the acute  phase of infection. The expected result is Negative. Fact Sheet for Patients:  StrictlyIdeas.no Fact Sheet for Healthcare Providers: BankingDealers.co.za This test is not yet approved or cleared by the [place] FDA and has been authorized for detection and/or diagnosis of SARS-CoV-2 by FDA under an Emergency Use Authorization (EUA).  This EUA will remain in effect (meaning this test can be used) for the duration of the COVID-19 declaration under Section 564(b)(1) of the Act, 21 U.S.C. section 360bbb-3(b)(1), unless the authorization is terminated or revoked sooner. Performed at [facility], [place] [address] [place], [place] [address]   MRSA PCR Screening     Status: Abnormal   Collection Time: 11/18/18  6:39 PM  Result Value Ref Range Status   MRSA by PCR POSITIVE (A) NEGATIVE Final    Comment:        The GeneXpert MRSA Assay (FDA approved for NASAL specimens only), is one component of a comprehensive MRSA colonization surveillance program. It is not intended to diagnose MRSA infection nor to guide or monitor treatment for MRSA infections. RESULT CALLED TO, READ BACK BY AND VERIFIED WITHDorna Bloom RN 11/18/18 2047 JDW Performed at [facility], [address] [place],  [address]       Labs: BNP (last 3 results) Recent Labs    11/16/18 0920  BNP 097.3*   Basic Metabolic Panel: Recent Labs  Lab 11/13/18 0830 11/16/18 0958 11/17/18 0224 11/18/18 0336  NA 142 137 139 139  K 4.2 4.3 4.4 3.8  CL 111 106 107 104  CO2 23 24 19* 25  GLUCOSE 113* 121* 80 86  BUN 16 27* 19 12  CREATININE 0.74 0.70 0.61 0.55*  CALCIUM 8.9 8.5* 8.8* 8.7*   Liver Function Tests: Recent Labs  Lab 11/16/18 0958 11/17/18 0224  AST 19 22  ALT 12 11  ALKPHOS 43 51  BILITOT 0.7 1.0  PROT 6.2* 6.8  ALBUMIN 2.8* 3.1*   No results for input(s): LIPASE, AMYLASE in the last 168 hours. Recent Labs  Lab 11/16/18 2006  AMMONIA 24   CBC: Recent Labs  Lab 11/16/18 0958 11/17/18 0224 11/18/18 0336  WBC 6.1 5.5 4.9  NEUTROABS 3.3 3.2  --   HGB 10.7* 12.3* 10.8*  HCT 34.3* 39.0 33.7*  MCV 83.9 83.3 82.2  PLT 120* 108* 133*   Cardiac Enzymes: No results for input(s): CKTOTAL, CKMB, CKMBINDEX, TROPONINI in the last 168 hours. BNP: Invalid input(s): POCBNP CBG: Recent Labs  Lab 11/18/18 0747 11/18/18 1202 11/18/18 1648 11/18/18 2108 11/19/18  0803  GLUCAP 70 163* 161* 138* 104*   D-Dimer No results for input(s): DDIMER in the last 72 hours. Hgb A1c Recent Labs    11/17/18 0224  HGBA1C 6.9*   Lipid Profile No results for input(s): CHOL, HDL, LDLCALC, TRIG, CHOLHDL, LDLDIRECT in the last 72 hours. Thyroid function studies Recent Labs    11/16/18 2005  TSH 0.581   Anemia work up Recent Labs    11/16/18 2005 11/16/18 2006  VITAMINB12 484  --   FOLATE  --  12.4   Urinalysis    Component Value Date/Time   COLORURINE YELLOW 11/16/2018 1410   APPEARANCEUR HAZY (A) 11/16/2018 1410   LABSPEC 1.018 11/16/2018 1410   PHURINE 5.0 11/16/2018 1410   GLUCOSEU NEGATIVE 11/16/2018 1410   GLUCOSEU NEGATIVE 04/29/2017 1211   HGBUR NEGATIVE 11/16/2018 1410   BILIRUBINUR NEGATIVE 11/16/2018 1410   KETONESUR NEGATIVE 11/16/2018 1410   PROTEINUR NEGATIVE 11/16/2018  1410   UROBILINOGEN 0.2 04/29/2017 1211   NITRITE NEGATIVE 11/16/2018 1410   LEUKOCYTESUR SMALL (A) 11/16/2018 1410   Sepsis Labs Invalid input(s): PROCALCITONIN,  WBC,  LACTICIDVEN  FURTHER DISCHARGE INSTRUCTIONS:   Get Medicines reviewed and adjusted: Please take all your medications with you for your next visit with your Primary MD   Laboratory/radiological data: Please request your Primary MD to go over all hospital tests and procedure/radiological results at the follow up, please ask your Primary MD to get all Hospital records sent to his/her office.   In some cases, they will be blood work, cultures and biopsy results pending at the time of your discharge. Please request that your primary care M.D. goes through all the records of your hospital data and follows up on these results.   Also Note the following: If you experience worsening of your admission symptoms, develop shortness of breath, life threatening emergency, suicidal or homicidal thoughts you must seek medical attention immediately by calling 911 or calling your MD immediately  if symptoms less severe.   You must read complete instructions/literature along with all the possible adverse reactions/side effects for all the Medicines you take and that have been prescribed to you. Take any new Medicines after you have completely understood and accpet all the possible adverse reactions/side effects.    Do not drive when taking Pain medications or sleeping medications (Benzodaizepines)   Do not take more than prescribed Pain, Sleep and Anxiety Medications. It is not advisable to combine anxiety,sleep and pain medications without talking with your primary care practitioner   Special Instructions: If you have smoked or chewed Tobacco  in the last 2 yrs please stop smoking, stop any regular Alcohol  and or any Recreational drug use.   Wear Seat belts while driving.   Please note: You were cared for by a hospitalist during your  hospital stay. Once you are discharged, your primary care physician will handle any further medical issues. Please note that NO REFILLS for any discharge medications will be authorized once you are discharged, as it is imperative that you return to your primary care physician (or establish a relationship with a primary care physician if you do not have one) for your post hospital discharge needs so that they can reassess your need for medications and monitor your lab values.  Time coordinating discharge: 40 minutes  SIGNED:  Marzetta Board, MD, PhD 11/19/2018, 10:23 AM

## 2018-11-19 NOTE — Telephone Encounter (Signed)
Will forward to [facility] to see if she can do wound check today  Chanetta Marshall, NP 11/19/2018 11:15 AM

## 2018-11-19 NOTE — TOC Transition Note (Signed)
Transition of Care [facility]) - CM/SW Discharge Note   Patient Details  Name: Jaime [name] MRN: [id number] Date of Birth: 10/31/1940  Transition of Care [facility]) CM/SW Contact:  Benard Halsted, LCSW Phone Number: 11/19/2018, 12:05 PM   Clinical Narrative:    Patient will DC to: [place] Anticipated DC date: 11/19/18 Family notified: Spouse, Lobbyist by: Corey Harold 1:30pm   Per MD patient ready for DC to [facility]. RN, patient, patient's family, and facility notified of DC. Discharge Summary and FL2 sent to facility. RN to call report prior to discharge [phone] Room 122A). DC packet on chart. Ambulance transport requested for patient.   CSW will sign off for now as social work intervention is no longer needed. Please consult [place] again if new needs arise.  Cedric Fishman, LCSW Clinical Social Worker [phone]    Final next level of care: Skilled Nursing Facility Barriers to Discharge: No Barriers Identified   Patient Goals and CMS Choice Patient states their goals for this hospitalization and ongoing recovery are:: Get stronger and return home CMS Medicare.gov Compare Post Acute Care list provided to:: Patient Represenative (must comment)([name] Shirer) Choice offered to / list presented to : Spouse  Discharge Placement   Existing PASRR number confirmed : 11/17/18          Patient [facility] bed at: [facility] Patient to be transferred to facility by: [facility] Name of family member notified: Spouse, [name] Patient and family notified of of transfer: 11/19/18  Discharge Plan and Services In-house Referral: Clinical Social Work Discharge Planning Services: [employer] Consult Post Acute Care Choice: Home Health, Nursing Home          DME Arranged: N/A DME Agency: NA       HH Arranged: NA [place] Agency: NA        Social Determinants of Health (SDOH) Interventions     Readmission Risk Interventions Readmission Risk Prevention Plan  11/19/2018  Transportation Screening Complete  PCP or Specialist Appt within 5-7 Days Complete  Home Care Screening Complete  Medication Review (RN CM) Complete  Some recent data might be hidden

## 2018-11-19 NOTE — Telephone Encounter (Signed)
° °  Patent's spouse calling, would like to know if the patient can have wound check that is scheduled for 5/4 done while in the hospital today or after discharge at the rehab center Please call

## 2018-11-19 NOTE — Plan of Care (Signed)

## 2018-11-19 NOTE — Consult Note (Signed)
   [facility] CM Inpatient Consult   11/19/2018  Jaime [name] 08-12-1940 [id number]   Patient screened for high risk score for unplanned readmission and hospitalizations to check if potential [place] Management services are needed .  Patient went to [employer] center today. Patient is a Cytogeneticist.  No current [facility] post hospital needs noted at this time. Will alert [facility] PAC team about transition to skilled facility.  For questions contact:   Natividad Brood, RN BSN [facility] Liaison  [phone] business mobile phone Toll free office [phone]  Fax number: [phone] [email or web address]  [email or web address]

## 2018-11-19 NOTE — Telephone Encounter (Signed)
Wife R/S patient's May appt. He is currently in the hospital. 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 She said his MS is getting worse and he feels like he has a lump in his throat. He is being transported today to [place] home and should be there for 20 days. She wanted to update yall on what was going on. Thanks!

## 2018-11-19 NOTE — Discharge Instructions (Signed)
Confusion °Confusion is the inability to think with the usual speed or clarity. People who are confused often describe their thinking as cloudy or unclear. Confusion can also include feeling disoriented. This means you are unaware of where you are or who you are. You may also not know the date or time. When confused, you may have difficulty remembering, paying attention, or making decisions. Some people also act aggressively when they are confused. °In some cases, confusion may come on quickly. In other cases, it may develop slowly over time. How quickly confusion comes on depends on the cause. °Confusion may be caused by: °· Head injury (concussion). °· Seizures. °· Stroke. °· Fever. °· Brain tumor. °· Decrease in brain function due to a vascular or neurologic condition (dementia). °· Emotions, like rage or terror. °· Inability to know what is real and what is not (hallucinations). °· Infections, such as a urinary tract infection (UTI). °· Using too much alcohol, drugs, or medicines. °· Loss of fluid (dehydration) or an imbalance of salts in the body (electrolytes). °· Lack of sleep. °· Low blood sugar (diabetes). °· Low levels of oxygen. This comes from conditions such as chronic lung disorders. °· Side effects of medicines, or taking medicines that affect other medicines (drug interactions). °· Lack of certain nutrients, especially niacin, thiamine, vitamin C, or vitamin B. °· Sudden drop in body temperature (hypothermia). °· Change in routine, such as traveling or being hospitalized. °Follow these instructions at home: °Pay attention to your symptoms. Tell your health care provider about any changes or if you develop new symptoms. Follow these instructions to control or treat symptoms. Ask a family member or friend for help if needed. °Medicines °· Take over-the-counter and prescription medicines only as told by your health care provider. °· Ask your health care provider about changing or stopping any medicines  that may be causing your confusion. °· Avoid pain medicines or sleep medicines until you have fully recovered. °· Use a pillbox or an alarm to help you take the right medicines at the right time. °Lifestyle ° °· Eat a balanced diet that includes fruits and vegetables. °· Get enough sleep. For most adults, this is 7-9 hours each night. °· Do not drink alcohol. °· Do not become isolated. Spend time with other people and make plans for your days. °· Do not drive until your health care provider says that it is safe to do so. °· Do not use any products that contain nicotine or tobacco, such as cigarettes and e-cigarettes. If you need help quitting, ask your health care provider. °· Stop other activities that may increase your chances of getting hurt. These may include some work duties, sports activities, swimming, or bike riding. Ask your health care provider what activities are safe for you. °What caregivers can do °· Find out if the person is confused. Ask the person to state his or her name, age, and the date. If the person is unsure or answers incorrectly, he or she may be confused. °· Always introduce yourself, no matter how well the person knows you. °· Remind the person of his or her location. Do this often. °· Place a calendar and clock near the person who is confused. °· Talk about current events and plans for the day. °· Keep the environment calm, quiet, and peaceful. °· Help the person do the things that he or she is unable to do. These include: °? Taking medicines. °? Keeping follow-up visits with his or her health care   provider. °? Helping with household duties, including meal preparation. °? Running errands. °· Get help if you need it. There are several support groups for caregivers. °· If the person you are helping needs more support, consider day care, extended care programs, or a skilled nursing facility. The person's health care provider may be able to help evaluate these options. °General  instructions °· Monitor yourself for any conditions you may have. These may include: °? Checking your blood glucose levels, if you have diabetes. °? Watching your weight, if you are overweight. °? Monitoring your blood pressure, if you have hypertension. °? Monitoring your body temperature, if you have a fever. °· Keep all follow-up visits as told by your health care provider. This is important. °Contact a health care provider if: °· Your symptoms get worse. °Get help right away if you: °· Feel that you are not able to care for yourself. °· Develop severe headaches, repeated vomiting, seizures, blackouts, or slurred speech. °· Have increasing confusion, weakness, numbness, restlessness, or personality changes. °· Develop a loss of balance, have marked dizziness, feel uncoordinated, or fall. °· Develop severe anxiety, or you have delusions or hallucinations. °These symptoms may represent a serious problem that is an emergency. Do not wait to see if the symptoms will go away. Get medical help right away. Call your local emergency services (911 in the [place] Do not drive yourself to the hospital. °Summary °· Confusion is the inability to think with the usual speed or clarity. People who are confused often describe their thinking as cloudy or unclear. °· Confusion can also include having difficulty remembering, paying attention, or making decisions. °· Confusion may come on quickly or develop slowly over time, depending on the cause. There are many different causes of confusion. °· Ask for help from family members or friends if you are unable to take care of yourself. °This information is not intended to replace advice given to you by your health care provider. Make sure you discuss any questions you have with your health care provider. °Document Released: 08/15/2004 Document Revised: 07/10/2017 Document Reviewed: 07/10/2017 °Elsevier Interactive Patient Education © 2019 Elsevier Inc. ° °

## 2018-11-20 ENCOUNTER — Telehealth: Payer: Self-pay | Admitting: *Deleted

## 2018-11-20 NOTE — Telephone Encounter (Signed)
Pt was on TCM report admitted 11/16/18 for Acute hypoxic respiratory failure due to healthcare associated pneumonia -on admission patient was started on broad-spectrum antibiotics, responded well, and eventually transitioned to [facility]. COVID-19 was negative. Pt D/C 11/19/18 top SNF. Per summary will need to follow-up w/PCP 1-2 weeks after leaving facility.Marland KitchenJohny Chess

## 2018-11-21 LAB — CULTURE, BLOOD (ROUTINE X 2)
Culture: NO GROWTH
Culture: NO GROWTH
Special Requests: ADEQUATE

## 2018-11-23 ENCOUNTER — Telehealth: Payer: Self-pay | Admitting: Internal Medicine

## 2018-11-23 ENCOUNTER — Other Ambulatory Visit: Payer: Self-pay

## 2018-11-23 ENCOUNTER — Telehealth (INDEPENDENT_AMBULATORY_CARE_PROVIDER_SITE_OTHER): Payer: Medicare Other | Admitting: Student

## 2018-11-23 DIAGNOSIS — I441 Atrioventricular block, second degree: Secondary | ICD-10-CM | POA: Diagnosis not present

## 2018-11-23 DIAGNOSIS — I255 Ischemic cardiomyopathy: Secondary | ICD-10-CM | POA: Diagnosis not present

## 2018-11-23 LAB — CUP PACEART REMOTE DEVICE CHECK
Battery Remaining Longevity: 55 mo
Battery Remaining Percentage: 95 %
Battery Voltage: 3.04 V
Brady Statistic AP VP Percent: 37 %
Brady Statistic AP VS Percent: 1 % — CL
Brady Statistic AS VP Percent: 63 %
Brady Statistic AS VS Percent: 1 % — CL
Brady Statistic RA Percent Paced: 37 %
Date Time Interrogation Session: 20200504145758
Implantable Lead Implant Date: 20200423
Implantable Lead Implant Date: 20200423
Implantable Lead Implant Date: 20200423
Implantable Lead Location: 753858
Implantable Lead Location: 753859
Implantable Lead Location: 753860
Implantable Pulse Generator Implant Date: 20200423
Lead Channel Impedance Value: 390 Ohm
Lead Channel Impedance Value: 480 Ohm
Lead Channel Impedance Value: 560 Ohm
Lead Channel Pacing Threshold Amplitude: 0.5 V
Lead Channel Pacing Threshold Amplitude: 2.75 V
Lead Channel Pacing Threshold Pulse Width: 0.5 ms
Lead Channel Pacing Threshold Pulse Width: 0.5 ms
Lead Channel Sensing Intrinsic Amplitude: 2.9 mV
Lead Channel Setting Pacing Amplitude: 2 V
Lead Channel Setting Pacing Amplitude: 3.5 V
Lead Channel Setting Pacing Amplitude: 3.75 V
Lead Channel Setting Pacing Pulse Width: 0.5 ms
Lead Channel Setting Pacing Pulse Width: 0.5 ms
Lead Channel Setting Sensing Sensitivity: 2 mV
Pulse Gen Serial Number: 9116598

## 2018-11-23 NOTE — Telephone Encounter (Signed)
New Message:   Patient wife calling concering husband appt today. She states that her husband is coming from a nursing home and she can not come with him and would like for some one to call her after his appt.

## 2018-11-23 NOTE — Telephone Encounter (Signed)
Jaime [name], [place], spoke with patient's wife regarding results from appointment.

## 2018-11-23 NOTE — Progress Notes (Signed)
I connected with Jaime [name] on 11/23/18 at 2:00 PM EDT by video/phone and verified that I am speaking with the correct person using two identifiers.   I discussed the limitations, risks, security and privacy concerns of performing an evaluation and management service by telephone and the availability of in person appointments. I also discussed with the patient that there may be a patient responsible charge related to this service. The patient expressed understanding and agreed to proceed.  Wound check appointment performed virtually. Steri-strips removed by bedside facility nurse. Wound without redness or edema. Incision edges approximated, wound well healed. Normal device function. BiV pacing 100% of the time. Estimated battery longevity 4 years 7  months. Thresholds, sensing, and impedances consistent with implant measurements. Device programmed at 3.5V/auto capture programmed on for extra safety margin until 3 month visit. Histogram distribution appropriate for patient and level of activity. 1 mode switch, 6 seconds of atrial tachycardia. Patient educated about wound care, arm mobility, lifting restrictions. ROV in 3 months with Dr Lovena Le.  Legrand Como [address]" [place], PA-C 11/23/2018 2:43 PM

## 2018-11-26 ENCOUNTER — Other Ambulatory Visit: Payer: Self-pay | Admitting: *Deleted

## 2018-11-26 DIAGNOSIS — J189 Pneumonia, unspecified organism: Secondary | ICD-10-CM | POA: Diagnosis not present

## 2018-11-26 DIAGNOSIS — I739 Peripheral vascular disease, unspecified: Secondary | ICD-10-CM | POA: Diagnosis not present

## 2018-11-26 DIAGNOSIS — I272 Pulmonary hypertension, unspecified: Secondary | ICD-10-CM | POA: Diagnosis not present

## 2018-11-26 DIAGNOSIS — G546 Phantom limb syndrome with pain: Secondary | ICD-10-CM | POA: Diagnosis not present

## 2018-11-26 NOTE — Patient Outreach (Signed)
[facility]) Care Management  11/26/2018  Jaime [name] 1940-08-18 [id number]   Member assessed for potential [place] Management services as a benefit of his Next[employer] Medicare insurance.  [name] is currently receiving rehab therapy at [place]. Member discussed in telephonic IDT meeting today with [facility] staff and [facility] UM RN.   Facility reports member is from with wife who is very attentive. States member has aide services at home as well. Reports disposition plan is to return home with wife.   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  Discussed that writer will continue to follow along disposition plans and needs.   Will plan to make [place] Management referral upon SNF discharge.    [name], MSN-Ed, RN,BSN [place] Acute Care Coordinator [phone]

## 2018-11-27 DIAGNOSIS — R0989 Other specified symptoms and signs involving the circulatory and respiratory systems: Secondary | ICD-10-CM | POA: Diagnosis not present

## 2018-11-27 DIAGNOSIS — R5381 Other malaise: Secondary | ICD-10-CM | POA: Diagnosis not present

## 2018-11-27 DIAGNOSIS — R05 Cough: Secondary | ICD-10-CM | POA: Diagnosis not present

## 2018-11-27 DIAGNOSIS — G35 Multiple sclerosis: Secondary | ICD-10-CM | POA: Diagnosis not present

## 2018-11-27 DIAGNOSIS — F05 Delirium due to known physiological condition: Secondary | ICD-10-CM | POA: Diagnosis not present

## 2018-11-27 DIAGNOSIS — F0390 Unspecified dementia without behavioral disturbance: Secondary | ICD-10-CM | POA: Diagnosis not present

## 2018-12-02 DIAGNOSIS — R5381 Other malaise: Secondary | ICD-10-CM | POA: Diagnosis not present

## 2018-12-02 DIAGNOSIS — F0390 Unspecified dementia without behavioral disturbance: Secondary | ICD-10-CM | POA: Diagnosis not present

## 2018-12-02 DIAGNOSIS — N390 Urinary tract infection, site not specified: Secondary | ICD-10-CM | POA: Diagnosis not present

## 2018-12-02 DIAGNOSIS — G35 Multiple sclerosis: Secondary | ICD-10-CM | POA: Diagnosis not present

## 2018-12-03 ENCOUNTER — Other Ambulatory Visit: Payer: Self-pay | Admitting: *Deleted

## 2018-12-03 ENCOUNTER — Telehealth: Payer: Self-pay

## 2018-12-03 DIAGNOSIS — F0390 Unspecified dementia without behavioral disturbance: Secondary | ICD-10-CM | POA: Diagnosis not present

## 2018-12-03 DIAGNOSIS — R5383 Other fatigue: Secondary | ICD-10-CM | POA: Diagnosis not present

## 2018-12-03 DIAGNOSIS — N390 Urinary tract infection, site not specified: Secondary | ICD-10-CM | POA: Diagnosis not present

## 2018-12-03 DIAGNOSIS — F05 Delirium due to known physiological condition: Secondary | ICD-10-CM | POA: Diagnosis not present

## 2018-12-03 NOTE — Patient Outreach (Signed)
[name] Care Management  12/03/2018  Jaime [name] 1940/09/07 [id number]    Telephone call made to [name] (wife) at [phone] ( [place] Management active consent remains on file) to discuss [place] Management engagement. Patient identifiers confirmed.   [name] endorses that member's disposition plan is to return home with caregiver assistance.   [name] states member will have caregivers for 10-20 hrs a week (20hrs until the end of may) thru DSS. Reports she also has hired a Industrial/product designer to come 3 days a week to fill in the gaps.  [name] states she feels like member will need 24/hr caregiver assistance but she is not sure if she is ready to have someone else in her home 24 hrs a day. States member has declined as of late and she is still healing from back surgery herself. States her daughter lives with them and usually would help at night. However, [name] reports their daughter had a heart attack shortly after member's recent hospitalization. Daughter remains in the home however.  Discussed [name]' thoughts of member needing long term care placement instead of returning home. [name] endorses that member could probably benefit from staying somewhere long term but states she would rather not due to the pandemic. States, " he would do better at home and I wouldn't want him to succumb to the virus by being in a facility. I just don't want him catching it anywhere."  Encouraged [name] to consider 24/7 caregiver assistance due to her back issues and her daughter's health issues. States she will look more into and see how it will work for her. [name] states she is in close contact with her DSS social worker [place]. Denies needing any [facility] social worker assistance at this time.   [name] endorses that she is familiar with [place] Management as member was active with [facility] last year. Active [facility] Care Management consent  remains on file. She is agreeable to [place] follow up post SNF discharge.   Discussed whether member was still active with [facility] care program. [name] stated that she spoke with staff once but she did not follow back up. [name] states she is interested in the [facility] Palliative program now. [name] is agreeable to Probation officer calling [facility] thru [facility] to see if they can outreach post SNF discharge for home based palliative care services.    Confirmed with [name] that member's PCP is still [name]. However, she states member has had visit with [name].   Confirmed the best contact number is [phone] and cell number [phone].  Discussed that writer will make referral for [name] post SNF discharge. Mrs. Keirsey reports member likely to come home next week.   Discussed that writer will call [facility] to request follow up post SNF discharge.  Confirmed member's address as [address] [place], [place] [address].  Mrs. Bourque expressed appreciation of call.   Provided contact information.   Will continue to follow for disposition plans and progression.   [name], MSN-Ed, RN,BSN [place] Acute Care Coordinator [phone]

## 2018-12-03 NOTE — Patient Outreach (Signed)
[facility]) Care Management  12/03/2018  Jaime [name] April 09, 1941 [id number]   Telephone call to Jaime [name] program administered by [employer] at [phone]. Confirmed with Jaime [name] that Jaime [name] case remains open. Made Jaime [name] aware of writer's conversation with member's wife and that Jaime [name] is now interested home based palliative care services for Jaime [name].   Discussed that Probation officer will contact [facility] once member discharges from [facility]. Member likely to discharge next week.  Will plan to alert [facility] Care of member's discharge from SNF.    Jaime Rolling, MSN-Ed, RN,BSN [place] Acute Care Coordinator [phone]

## 2018-12-03 NOTE — Telephone Encounter (Signed)
Received call from [facility] with [place] management. Patient is currently in [facility]. Plan is to d/c home next week.and would like to have a palliative care visit. Orrin Brigham to notify Palliative Care when patient d/c from facility

## 2018-12-03 NOTE — Patient Outreach (Signed)
[facility]) Care Management  12/03/2018  Jaime [name] Oct 14, 1940 [id number]    Patient discussed in telephonic IDT meeting with [facility] UM team and facility staff.  Mr. Stage is being screened for potential [facility] Care Management services due to his NextGen Medicare benefit.   [name] remains at [facility] receiving rehab therapy.  Writer advised to contact member's wife regarding [place] Management follow up.  Facility staff reports member will discharge home with wife. States wife is very engaged in [employer] care.  Facility reports they will address advance directives per [place] Director's request.   Also discussed, per chart, that member has been outreached by [place] team last year for home based palliative care services.  Writer to contact member's wife to discuss [place] Management services.   Will plan to follow up with [place] program thru [facility] to see if member's referral is still active.   Will continue to collaborate with [facility] UM team and facility staff.    [name], MSN-Ed, RN,BSN [place] Acute Care Coordinator [phone]

## 2018-12-09 ENCOUNTER — Ambulatory Visit: Payer: Medicare Other | Admitting: Neurology

## 2018-12-11 DIAGNOSIS — F0390 Unspecified dementia without behavioral disturbance: Secondary | ICD-10-CM | POA: Diagnosis not present

## 2018-12-11 DIAGNOSIS — G35 Multiple sclerosis: Secondary | ICD-10-CM | POA: Diagnosis not present

## 2018-12-11 DIAGNOSIS — I739 Peripheral vascular disease, unspecified: Secondary | ICD-10-CM | POA: Diagnosis not present

## 2018-12-11 DIAGNOSIS — G3184 Mild cognitive impairment, so stated: Secondary | ICD-10-CM | POA: Diagnosis not present

## 2018-12-11 DIAGNOSIS — I5022 Chronic systolic (congestive) heart failure: Secondary | ICD-10-CM | POA: Diagnosis not present

## 2018-12-11 DIAGNOSIS — G9341 Metabolic encephalopathy: Secondary | ICD-10-CM | POA: Diagnosis not present

## 2018-12-11 DIAGNOSIS — I272 Pulmonary hypertension, unspecified: Secondary | ICD-10-CM | POA: Diagnosis not present

## 2018-12-16 ENCOUNTER — Telehealth: Payer: Self-pay | Admitting: *Deleted

## 2018-12-16 ENCOUNTER — Other Ambulatory Visit: Payer: Self-pay | Admitting: *Deleted

## 2018-12-16 DIAGNOSIS — Z7189 Other specified counseling: Secondary | ICD-10-CM | POA: Diagnosis not present

## 2018-12-16 DIAGNOSIS — E1165 Type 2 diabetes mellitus with hyperglycemia: Secondary | ICD-10-CM | POA: Diagnosis not present

## 2018-12-16 DIAGNOSIS — I10 Essential (primary) hypertension: Secondary | ICD-10-CM | POA: Diagnosis not present

## 2018-12-16 DIAGNOSIS — Z7902 Long term (current) use of antithrombotics/antiplatelets: Secondary | ICD-10-CM | POA: Diagnosis not present

## 2018-12-16 DIAGNOSIS — I509 Heart failure, unspecified: Secondary | ICD-10-CM | POA: Diagnosis not present

## 2018-12-16 NOTE — Patient Outreach (Addendum)
Confirmed in Patient Jaime [name] that member discharged home on 12/15/18. Disposition plan is to return home with wife and caregiver assistance.   Please refer to writer's notes from 12/03/18 for additional details.   As previously planned and discussed with wife on 12/03/18. Telephone call made to [facility] with Jaime [name] to make aware of Jaime [name] discharge to home on 12/15/18.  Will also make [place] Management referral for [place] for complex case management. Primary contact is Jaime [name] wife (home) [phone] and (cell) [phone]. [facility] Care Management active consent remains on file. Jaime [name] has been active with [place] Management in the past.  [name], MSN-Ed, RN,BSN [place] Acute Care Coordinator [phone]

## 2018-12-16 NOTE — Telephone Encounter (Signed)
Called pt/wife concerning d/c from SNF on yesterday. Wife confirmed husband is home. Inform need to make a hosp f/u appt for husband. Wife would like virtual since husband in wheel chair. Completed TCM call below.Jaime [name]  Transition Care Management Follow-up Telephone Call   Date discharged? 12/15/18 from [facility]   How have you been since you were released from the hospital? Wife states he seem to be doing alright. He's glad to be home   Do you understand why you were in the hospital? YES   Do you understand the discharge instructions? YES   Where were you discharged to? Home   Items Reviewed:  Medications reviewed: YES, per wife no changes  Allergies reviewed: YES  Dietary changes reviewed: NO  Referrals reviewed: No referral recommeded   Functional Questionnaire:   Activities of Daily Living (ADLs):   she states he are not independent due to being paralysis  States he require assistance with the following: ambulation, bathing and hygiene, feeding, continence, grooming, toileting and dressing   Any transportation issues/concerns?: NO   Any patient concerns? NO   Confirmed importance and date/time of follow-up visits scheduled YES, (virtual) 12/28/18  Provider Appointment booked with [name]   Confirmed with patient if condition begins to worsen call PCP or go to the ER.  Patient was given the office number and encouraged to call back with question or concerns.  : YES

## 2018-12-16 NOTE — Telephone Encounter (Signed)
Rec'd msg on Patient Jaime [name] stating on 11/19/18 @ 2:30 PM pt was admitted to [facility] from [facility]. He has been discharged from [facility] to [facility] 12/15/18. Will call pt to follow-up and make appt id need too.Marland KitchenJohny Chess

## 2018-12-17 ENCOUNTER — Other Ambulatory Visit: Payer: Self-pay | Admitting: *Deleted

## 2018-12-17 DIAGNOSIS — I272 Pulmonary hypertension, unspecified: Secondary | ICD-10-CM | POA: Diagnosis not present

## 2018-12-17 DIAGNOSIS — Z950 Presence of cardiac pacemaker: Secondary | ICD-10-CM | POA: Diagnosis not present

## 2018-12-17 DIAGNOSIS — N400 Enlarged prostate without lower urinary tract symptoms: Secondary | ICD-10-CM | POA: Diagnosis not present

## 2018-12-17 DIAGNOSIS — Z89611 Acquired absence of right leg above knee: Secondary | ICD-10-CM | POA: Diagnosis not present

## 2018-12-17 DIAGNOSIS — I441 Atrioventricular block, second degree: Secondary | ICD-10-CM | POA: Diagnosis not present

## 2018-12-17 DIAGNOSIS — E1165 Type 2 diabetes mellitus with hyperglycemia: Secondary | ICD-10-CM | POA: Diagnosis not present

## 2018-12-17 DIAGNOSIS — Z89612 Acquired absence of left leg above knee: Secondary | ICD-10-CM | POA: Diagnosis not present

## 2018-12-17 DIAGNOSIS — I110 Hypertensive heart disease with heart failure: Secondary | ICD-10-CM | POA: Diagnosis not present

## 2018-12-17 DIAGNOSIS — G35 Multiple sclerosis: Secondary | ICD-10-CM | POA: Diagnosis not present

## 2018-12-17 DIAGNOSIS — Z48812 Encounter for surgical aftercare following surgery on the circulatory system: Secondary | ICD-10-CM | POA: Diagnosis not present

## 2018-12-17 DIAGNOSIS — Z87440 Personal history of urinary (tract) infections: Secondary | ICD-10-CM | POA: Diagnosis not present

## 2018-12-17 DIAGNOSIS — G546 Phantom limb syndrome with pain: Secondary | ICD-10-CM | POA: Diagnosis not present

## 2018-12-17 DIAGNOSIS — G9341 Metabolic encephalopathy: Secondary | ICD-10-CM | POA: Diagnosis not present

## 2018-12-17 DIAGNOSIS — J189 Pneumonia, unspecified organism: Secondary | ICD-10-CM | POA: Diagnosis not present

## 2018-12-17 DIAGNOSIS — F0280 Dementia in other diseases classified elsewhere without behavioral disturbance: Secondary | ICD-10-CM | POA: Diagnosis not present

## 2018-12-17 DIAGNOSIS — I5042 Chronic combined systolic (congestive) and diastolic (congestive) heart failure: Secondary | ICD-10-CM | POA: Diagnosis not present

## 2018-12-17 DIAGNOSIS — E1151 Type 2 diabetes mellitus with diabetic peripheral angiopathy without gangrene: Secondary | ICD-10-CM | POA: Diagnosis not present

## 2018-12-17 DIAGNOSIS — I2510 Atherosclerotic heart disease of native coronary artery without angina pectoris: Secondary | ICD-10-CM | POA: Diagnosis not present

## 2018-12-17 DIAGNOSIS — Z7401 Bed confinement status: Secondary | ICD-10-CM | POA: Diagnosis not present

## 2018-12-17 NOTE — Patient Outreach (Signed)
[facility]) Care Management  12/17/2018  Jaime [name] 1941/04/10 [id number]   Referral received from post acute care coordinator as member was recently discharged from SNF.  He was admitted to hospital 4/27-4/30 with altered mental status and went to SNF for rehab.  Discharged from SNF on 5/26.  Per chart, he has history of CAD, pulmonary HTN, PVD, CHF, GERD, DM, Multiple sclerosis, hyperlipidemia, and bilater AKA.  Noted that wife [name] is point of contact.  Call placed to [facility] caregiver/wife for transition of care assessment, no answer.  HIPAA compliant voice message left, unsuccessful outreach letter sent.  Will follow up within the next 3-4 business days.  [name], [place], MSN [place] [phone]

## 2018-12-18 DIAGNOSIS — G9341 Metabolic encephalopathy: Secondary | ICD-10-CM | POA: Diagnosis not present

## 2018-12-18 DIAGNOSIS — E1165 Type 2 diabetes mellitus with hyperglycemia: Secondary | ICD-10-CM | POA: Diagnosis not present

## 2018-12-18 DIAGNOSIS — J189 Pneumonia, unspecified organism: Secondary | ICD-10-CM | POA: Diagnosis not present

## 2018-12-18 DIAGNOSIS — I2510 Atherosclerotic heart disease of native coronary artery without angina pectoris: Secondary | ICD-10-CM | POA: Diagnosis not present

## 2018-12-18 DIAGNOSIS — Z950 Presence of cardiac pacemaker: Secondary | ICD-10-CM | POA: Diagnosis not present

## 2018-12-18 DIAGNOSIS — Z48812 Encounter for surgical aftercare following surgery on the circulatory system: Secondary | ICD-10-CM | POA: Diagnosis not present

## 2018-12-22 ENCOUNTER — Other Ambulatory Visit: Payer: Self-pay | Admitting: *Deleted

## 2018-12-22 ENCOUNTER — Telehealth: Payer: Self-pay

## 2018-12-22 NOTE — Telephone Encounter (Signed)
Phone call placed to patient/wife to check in and offer to schedule visit with Palliative Care. VM left

## 2018-12-22 NOTE — Patient Outreach (Signed)
[name]) Care Management  12/22/2018  Jaime [name] 29-Jul-1940 [id number]   Second attempt made to contact member's wife for transition of care assessment, successful.  Member's identity verified.  This care manager introduced self and stated purpose of call.  [name] care management services explained.  She report member is "about the same" since being discharged from SNF.  State she feel his dementia is progressing, noticed that he is not his "normal" self and is more quiet.  Also report she is having to help him with feeding at times.    She confirms that she has aides coming in daily Monday-Friday for a couple hours from the [place] [facility].  Also report she is also paying out of pocket 3 days a week to get additional aide services.  They help with bathing, cleaning, laundry, and some meal prep.  She acknowledges that member will need more support either in the home on with placement soon.  Their adult daughter lives with them but is unable to provide assistance as she is disabled, recently having an MI.  Wife herself state she has had back surgery and unable to pull/lift member without risking injury to herself.  They have a hoyer lift that is used to move member but she is requesting assistance with a power lift as the manual one causes her to have back pain.  She has a sister and brother that are available to provide minimal assistance.    Wife report member is a DNR.  This care manager inquired about palliative care services.  She state she did receive a call from [place] with [facility] but has not had a chance to call back.  She questions the difference between hospice and palliative care.  Both programs explained, she agrees that member would benefit from services.  Provided with contact information for [facility].    Medications reviewed, state she prepares pill box every week for medication management.  Has follow up with primary MD next week, 6/8, visit  will be done virtually.  He has had PT evaluation but unable to start therapy due to current condition.  He will also have assessment for OT.  Wife denies any urgent concerns at this time.  Provided with this care manager's contact information, advised to contact with questions.  Will place referral for social worker to assist with long term plan for support.  Will follow up with wife within the next week.  THN CM Care Plan Problem One     Most Recent Value  Care Plan Problem One  Risk for readmission as evidenced by recent hospitalization  Role Documenting the Problem One  Care Management [place] for Problem One  Active  THN Long Term Goal   Member will not be readmitted to hospital within the next 31 days  THN Long Term Goal Start Date  12/22/18  Interventions for Problem One Long Term Goal  Discharge instructions reviewed with member's wife, including involvement with home health team, medication management, and MD follow up  [facility] CM Short Term Goal #1   Member's wife will have long term plan for for increased support for member within the next 2 weeks  THN CM Short Term Goal #1 Start Date  12/22/18  Interventions for Short Term Goal #1  Referral placed to social worker to assist with placement plans for long term support  THN CM Short Term Goal #2   Member's wife will report contact with palliative care team  within the next week  THN CM Short Term Goal #2 Start Date  12/22/18  Interventions for Short Term Goal #2  Discussed palliative care involvement, provided member with contact information for [facility].  Contacted [facility] to discuss wife's interest in involvement.     [name], [place], MSN [place] [phone]

## 2018-12-22 NOTE — Telephone Encounter (Signed)
Received message to call [facility], with [facility]. Phone call placed to [facility]. VM left

## 2018-12-22 NOTE — Telephone Encounter (Signed)
Vm left to check in with patient and to offer to schedule visit with Palliative Care.

## 2018-12-23 ENCOUNTER — Telehealth: Payer: Self-pay | Admitting: Adult Health Nurse Practitioner

## 2018-12-23 ENCOUNTER — Encounter: Payer: Self-pay | Admitting: *Deleted

## 2018-12-23 NOTE — Telephone Encounter (Signed)
Called patient/wife to set up appointment.  Left VM with contact info Pinki Rottman K. Olena Heckle NP

## 2018-12-24 DIAGNOSIS — Z950 Presence of cardiac pacemaker: Secondary | ICD-10-CM | POA: Diagnosis not present

## 2018-12-24 DIAGNOSIS — G9341 Metabolic encephalopathy: Secondary | ICD-10-CM | POA: Diagnosis not present

## 2018-12-24 DIAGNOSIS — I2510 Atherosclerotic heart disease of native coronary artery without angina pectoris: Secondary | ICD-10-CM | POA: Diagnosis not present

## 2018-12-24 DIAGNOSIS — Z48812 Encounter for surgical aftercare following surgery on the circulatory system: Secondary | ICD-10-CM | POA: Diagnosis not present

## 2018-12-24 DIAGNOSIS — J189 Pneumonia, unspecified organism: Secondary | ICD-10-CM | POA: Diagnosis not present

## 2018-12-24 DIAGNOSIS — E1165 Type 2 diabetes mellitus with hyperglycemia: Secondary | ICD-10-CM | POA: Diagnosis not present

## 2018-12-25 DIAGNOSIS — Z950 Presence of cardiac pacemaker: Secondary | ICD-10-CM | POA: Diagnosis not present

## 2018-12-25 DIAGNOSIS — I2510 Atherosclerotic heart disease of native coronary artery without angina pectoris: Secondary | ICD-10-CM | POA: Diagnosis not present

## 2018-12-25 DIAGNOSIS — J189 Pneumonia, unspecified organism: Secondary | ICD-10-CM | POA: Diagnosis not present

## 2018-12-25 DIAGNOSIS — Z48812 Encounter for surgical aftercare following surgery on the circulatory system: Secondary | ICD-10-CM | POA: Diagnosis not present

## 2018-12-25 DIAGNOSIS — E1165 Type 2 diabetes mellitus with hyperglycemia: Secondary | ICD-10-CM | POA: Diagnosis not present

## 2018-12-25 DIAGNOSIS — G9341 Metabolic encephalopathy: Secondary | ICD-10-CM | POA: Diagnosis not present

## 2018-12-26 NOTE — Progress Notes (Signed)
Virtual Visit via Video Note  I connected with Jaime [name] on 12/28/18 at  1:00 PM EDT by a video enabled telemedicine application and verified that I am speaking with the correct person using two identifiers.   I discussed the limitations of evaluation and management by telemedicine and the availability of in person appointments. The patient expressed understanding and agreed to proceed.  The patient is currently at home and I am in the office.  His wife is with him and provides most of the history.  No referring provider.    History of Present Illness: This visit is for hospital follow up, he was just discharged from SNF.   Hospital    11/16/2018 -11/19/2018 [facility]  11/19/2018 -12/15/2018  He went to the ED with generalized weakness and deconditioning.  He was in the hospital on 4/23 for an elective pacemaker implantation.   He remained weak after this and got weaker requiring more assistance from his wife.  The day before he went to the ED he was confused and lethargic.  He had minimal oral intake.  He was unresponsive the morning he was sent to the ED.  In the ED he had some cough, hypoxic to 87% on RA and hypotensive.  CXR showed atelectasis vs infiltrate.  COVID-19 negative.  He was admitted for probable PNA.   Acute hypoxic resp failure due to healthcare associated PNA: Started on broad spectrum antibiotics - changed to [facility] completed at rehab Respiratory status improved after antibiotics and O2 was good on RA prior to being discharged to rehab. Since being home he does have his oxygen saturation checked by his home health aides and it has been good He does have some shortness of breath that is somewhat chronic-his wife states that if he sits up that often helps with the shortness of breath and when he recently took Lasix that seemed to help as well   Acute metabolic encephalopathy with underlying dementia: Due to PNA and Klebsiella UTI Ct of head w/o acute  abnormalities Mental status improved during hospitalization Mental status at his baseline  Klebsiella UTI: Treated His wife states that his urine is dark at times and when she makes him increase his water intake the urine color returns to normal  Hypertension: Stable,continued on coreg and lisinopril Blood pressure well controlled at home  DM, uncontrolled with PVD: Continued on home regimen Sugars well controlled at home  Generalized weakness, FTT: D/c'd to SNF His wife thinks he is still weak.  She states that his weakness improved just a little during his time at rehab.  He is still very weak Has PT/OT at home, but is still limited  Second degree heart block s/p PPM: Stable  Progressive multiple sclerosis: stable  Constipation:   He has been having constipation since coming home from rehab. His wife states home health aides have helped and he is now taking things that are keeping his constipation controlled and she will keep an eye on this  Swelling, wheezing: The palliative care doctor increased his Lasix for 3 days-he typically takes 1 pill a day, but this was increased to 2 pills for 3 days. His wife did notice decreased swelling, improvement in the wheezing and his shortness of breath Palliative care is following him and she has a follow-up appointment She knows to monitor for the wheezing, increased shortness of breath and swelling     Review of Systems  Constitutional: Negative for chills and fever.  Respiratory: Positive for  cough, shortness of breath and wheezing.   Cardiovascular: Positive for leg swelling. Negative for chest pain and palpitations.  Gastrointestinal: Positive for constipation. Negative for abdominal pain and nausea.  Genitourinary: Negative for dysuria, frequency and hematuria.  Neurological: Negative for dizziness and headaches.       Social History   Socioeconomic History  . Marital status: Married    Spouse name: Matti Minney   . Number of children: 2  . Years of education: Not on file  . Highest education level: Not on file  Occupational History  . Occupation: disable    Employer: RETIRED  Social Needs  . Financial resource strain: Not on file  . Food insecurity:    Worry: Not on file    Inability: Not on file  . Transportation needs:    Medical: No    Non-medical: No  Tobacco Use  . Smoking status: Former Smoker    Years: 50.00    Last attempt to quit: 07/22/2012    Years since quitting: 6.4  . Smokeless tobacco: Current User  . Tobacco comment: pt states that he is using E-cigs  Substance and Sexual Activity  . Alcohol use: No    Alcohol/week: 0.0 standard drinks  . Drug use: No  . Sexual activity: Not Currently    Comment: electronic cigarettes no nicotene  Lifestyle  . Physical activity:    Days per week: 0 days    Minutes per session: 0 min  . Stress: Not on file  Relationships  . Social connections:    Talks on phone: Not on file    Gets together: Not on file    Attends religious service: Not on file    Active member of club or organization: Not on file    Attends meetings of clubs or organizations: Not on file    Relationship status: Not on file  Other Topics Concern  . Not on file  Social History Narrative   Lives w/ wife     Observations/Objective: Appears well in NAD Breathing normally   BP 112-119/60's,  Pulse low 60's Sugars good No fever    Lab Results  Component Value Date   WBC 4.9 11/18/2018   HGB 10.8 (L) 11/18/2018   HCT 33.7 (L) 11/18/2018   PLT 133 (L) 11/18/2018   GLUCOSE 86 11/18/2018   CHOL 110 11/25/2017   TRIG 68.0 11/25/2017   HDL 32.60 (L) 11/25/2017   LDLDIRECT 123.5 03/17/2008   LDLCALC 64 11/25/2017   ALT 11 11/17/2018   AST 22 11/17/2018   NA 139 11/18/2018   K 3.8 11/18/2018   CL 104 11/18/2018   CREATININE 0.55 (L) 11/18/2018   BUN 12 11/18/2018   CO2 25 11/18/2018   TSH 0.581 11/16/2018   PSA 2.31 04/04/2010   INR 1.27  07/01/2016   HGBA1C 6.9 (H) 11/17/2018   MICROALBUR 3.6 (H) 07/31/2007    CUP PACEART REMOTE DEVICE CHECK Wound check appointment performed virtually. Steri-strips removed by bedside facility nurse. Wound without redness or edema. Incision edges approximated, wound well healed. Normal device function. BiV pacing 100% of the time. Estimated battery longevity  4 years 7  months. Thresholds, sensing, and impedances consistent with implant measurements. Device programmed at 3.5V/auto capture programmed on for extra safety margin until 3 month visit. Histogram distribution appropriate for patient and level of  activity. 1 mode switch, 6 seconds of atrial tachycardia. Patient educated about wound care, arm mobility, lifting restrictions. ROV in 3 months with Dr Lovena Le.  CT head 11/16/2018: IMPRESSION: Negative for acute intracranial abnormality.  Chronic microvascular ischemic disease.  CXR 11/12/2018: IMPRESSION: Mild left midlung atelectasis. No edema or consolidation. Heart mildly enlarged. Aortic Atherosclerosis (ICD10-I70.0).    Assessment and Plan:  See Problem List for Assessment and Plan of chronic medical problems.   Follow Up Instructions:    I discussed the assessment and treatment plan with the patient. The patient was provided an opportunity to ask questions and all were answered. The patient agreed with the plan and demonstrated an understanding of the instructions.   The patient was advised to call back or seek an in-person evaluation if the symptoms worsen or if the condition fails to improve as anticipated.  Follow-up in 6 months  [name], MD

## 2018-12-28 ENCOUNTER — Encounter: Payer: Self-pay | Admitting: Internal Medicine

## 2018-12-28 ENCOUNTER — Ambulatory Visit (INDEPENDENT_AMBULATORY_CARE_PROVIDER_SITE_OTHER): Payer: Medicare Other | Admitting: Internal Medicine

## 2018-12-28 ENCOUNTER — Telehealth: Payer: Self-pay | Admitting: Internal Medicine

## 2018-12-28 DIAGNOSIS — I2510 Atherosclerotic heart disease of native coronary artery without angina pectoris: Secondary | ICD-10-CM | POA: Diagnosis not present

## 2018-12-28 DIAGNOSIS — I10 Essential (primary) hypertension: Secondary | ICD-10-CM | POA: Diagnosis not present

## 2018-12-28 DIAGNOSIS — B961 Klebsiella pneumoniae [K. pneumoniae] as the cause of diseases classified elsewhere: Secondary | ICD-10-CM | POA: Diagnosis not present

## 2018-12-28 DIAGNOSIS — N390 Urinary tract infection, site not specified: Secondary | ICD-10-CM

## 2018-12-28 DIAGNOSIS — E1159 Type 2 diabetes mellitus with other circulatory complications: Secondary | ICD-10-CM

## 2018-12-28 DIAGNOSIS — G9341 Metabolic encephalopathy: Secondary | ICD-10-CM | POA: Diagnosis not present

## 2018-12-28 DIAGNOSIS — I5022 Chronic systolic (congestive) heart failure: Secondary | ICD-10-CM

## 2018-12-28 DIAGNOSIS — I739 Peripheral vascular disease, unspecified: Secondary | ICD-10-CM | POA: Diagnosis not present

## 2018-12-28 DIAGNOSIS — J189 Pneumonia, unspecified organism: Secondary | ICD-10-CM | POA: Diagnosis not present

## 2018-12-28 DIAGNOSIS — I5042 Chronic combined systolic (congestive) and diastolic (congestive) heart failure: Secondary | ICD-10-CM

## 2018-12-28 DIAGNOSIS — Z794 Long term (current) use of insulin: Secondary | ICD-10-CM

## 2018-12-28 MED ORDER — FUROSEMIDE 20 MG PO TABS: 20.0000 mg | ORAL_TABLET | Freq: Every day | ORAL | 0 refills | Status: DC

## 2018-12-28 MED ORDER — AMOXICILLIN 500 MG PO TABS: ORAL_TABLET | ORAL | 0 refills | Status: DC

## 2018-12-28 NOTE — Telephone Encounter (Signed)
New Message   Pt c/o medication issue:  1. Name of Medication: amoxicillin   2. How are you currently taking this medication (dosage and times per day)?   3. Are you having a reaction (diffiulty breathing--STAT)?  4. What is your medication issue? Patients wife is calling because Dr. Lovena Le prescribed him 4 amoxicillin prior to a dental procedure but she gave him two by accident.  She is requesting two more. Please cal.

## 2018-12-28 NOTE — Telephone Encounter (Signed)
Spoke with wife.  She accidentally gave 2 amoxicillin tabs upon discharge from hospital.  Sent in another one time script for 4 tabs prior to dental procedure

## 2018-12-29 ENCOUNTER — Encounter: Payer: Self-pay | Admitting: *Deleted

## 2018-12-29 ENCOUNTER — Other Ambulatory Visit: Payer: Self-pay | Admitting: *Deleted

## 2018-12-29 DIAGNOSIS — E1165 Type 2 diabetes mellitus with hyperglycemia: Secondary | ICD-10-CM | POA: Diagnosis not present

## 2018-12-29 DIAGNOSIS — G9341 Metabolic encephalopathy: Secondary | ICD-10-CM | POA: Diagnosis not present

## 2018-12-29 DIAGNOSIS — I2510 Atherosclerotic heart disease of native coronary artery without angina pectoris: Secondary | ICD-10-CM | POA: Diagnosis not present

## 2018-12-29 DIAGNOSIS — Z48812 Encounter for surgical aftercare following surgery on the circulatory system: Secondary | ICD-10-CM | POA: Diagnosis not present

## 2018-12-29 DIAGNOSIS — Z950 Presence of cardiac pacemaker: Secondary | ICD-10-CM | POA: Diagnosis not present

## 2018-12-29 DIAGNOSIS — J189 Pneumonia, unspecified organism: Secondary | ICD-10-CM | POA: Diagnosis not present

## 2018-12-29 NOTE — Patient Outreach (Signed)
[facility]) Care Management  12/29/2018  Jaime [name] May 02, 1941 [id number]   Weekly transition of care call placed to member's caregiver/wife.  She report member is doing somewhat better.  State although he has not recovered 100% he is talking more and "acting more like himself."  Report home health RN & OT will have visits today.  Confirms he did have virtual visit with primary MD yesterday, denies concerns.  This care manger inquired about contact with [facility] for palliative care involvement, state she has been in contact with them and they are going to schedule a visit soon.  Report they still have the aides coming in daily but still looking forward to developing a long term plan with CSW.  Denies any urgent concerns at this time, advised to contact this care manager with questions.  Will follow up within the next week.  Fall Risk  12/29/2018 12/08/2017 06/10/2017 10/29/2016 06/06/2016  Falls in the past year? 0 No No No Exclusion - non ambulatory  Risk for fall due to : Impaired mobility - - - -  Risk for fall due to: Comment - - - - -   Depression screen [facility] 2/9 10/29/2016 10/11/2015 08/28/2015 06/27/2015 06/13/2015  Decreased Interest 0 0 0 0 0  Down, Depressed, Hopeless 0 0 0 0 0  PHQ - 2 Score 0 0 0 0 0  Some recent data might be hidden   [facility] CM Care Plan Problem One     Most Recent Value  Care Plan Problem One  Risk for readmission as evidenced by recent hospitalization  Role Documenting the Problem One  Care Management [place] for Problem One  Active  THN Long Term Goal   Member will not be readmitted to hospital within the next 31 days  THN Long Term Goal Start Date  12/22/18  Interventions for Problem One Long Term Goal  Educated on importance of following plan of care (including medications and home health involvement) in effort to decrease risk of readmission.  THN CM Short Term Goal #1   Member's wife will have long term plan for for  increased support for member within the next 2 weeks  THN CM Short Term Goal #1 Start Date  12/22/18  Interventions for Short Term Goal #1  Provided wife with name of assigned THN CSW, advised to await contact and urged to answer/call back to discuss plan  THN CM Short Term Goal #2   Member's wife will report contact with palliative care team within the next week  THN CM Short Term Goal #2 Start Date  12/22/18  Interventions for Short Term Goal #2  Confirmed with wife that she contacted palliative care.  Advised to schedule visit (face to face or virtual) in effort to get member enrolled in program.     [name], RN, MSN [place] Manager [phone]

## 2019-01-05 ENCOUNTER — Telehealth: Payer: Self-pay | Admitting: Internal Medicine

## 2019-01-05 ENCOUNTER — Other Ambulatory Visit: Payer: Self-pay | Admitting: *Deleted

## 2019-01-05 DIAGNOSIS — Z950 Presence of cardiac pacemaker: Secondary | ICD-10-CM | POA: Diagnosis not present

## 2019-01-05 DIAGNOSIS — J189 Pneumonia, unspecified organism: Secondary | ICD-10-CM | POA: Diagnosis not present

## 2019-01-05 DIAGNOSIS — E1165 Type 2 diabetes mellitus with hyperglycemia: Secondary | ICD-10-CM | POA: Diagnosis not present

## 2019-01-05 DIAGNOSIS — G9341 Metabolic encephalopathy: Secondary | ICD-10-CM | POA: Diagnosis not present

## 2019-01-05 DIAGNOSIS — I2510 Atherosclerotic heart disease of native coronary artery without angina pectoris: Secondary | ICD-10-CM | POA: Diagnosis not present

## 2019-01-05 DIAGNOSIS — Z48812 Encounter for surgical aftercare following surgery on the circulatory system: Secondary | ICD-10-CM | POA: Diagnosis not present

## 2019-01-05 NOTE — Telephone Encounter (Signed)
Called wife to see how patient was doing. She stated that he was doing better and was more alert. He has also been eating better.

## 2019-01-05 NOTE — Telephone Encounter (Signed)
Joelene Millin with [facility] making a home visit with patient. She calls to report-his wife states he is more lethargic than his normal since yesterday. Will not keep his eyes open while eating, being more figity than usual. Reports patient was sleeping during time of visit. Woke him up to take v/s b/p 102/60, cbg 127 fasting. Reports the patient said hello to her like usual. No other vitals or assessment reported. Routing to PCP for further contact with patient's wife regarding his condition.

## 2019-01-05 NOTE — Telephone Encounter (Signed)
Monitor only for now.  Call back if symptoms change.

## 2019-01-05 NOTE — Patient Outreach (Signed)
[name]) Care Management  01/05/2019  Jaime [name] 07-20-1941 [id number]   Weekly transition of care call placed to member's caregiver/wife.  She report member is doing "about the same."  State he is resting with his eyes closed more, does not think he is sleeping more.  Report they had visit with home health nurse from [place] today and nurse contacted MD office to report low blood pressure (102/70).  No other symptoms of distress. She report his blood sugars remain "good" and his appetite is "good."    Had virtual visit with [name] on 6/8.  State member has also been seen by [name] in the home in the past, but does not feel he is in need of a follow up home visit at this time.  State she still has the contact information and should member's health begin to decline further she will call to have MD come to the home.    This care manager inquired about contact with [facility], state she has spoke to [place], however per note a voice message was left.  Offered to contact [facility] today to schedule visit, she declines stating she has some things to at this time.  Provided with contact information, advised to contact as soon as possible.    Still has not been in contact with [facility] CSW, will collaborate with CSW to schedule contact.  Denies any urgent concerns at this time, advised to contact this care manager with questions.  THN CM Care Plan Problem One     Most Recent Value  Care Plan Problem One  Risk for readmission as evidenced by recent hospitalization  Role Documenting the Problem One  Care Management [place] for Problem One  Active  THN Long Term Goal   Member will not be readmitted to hospital within the next 31 days  THN Long Term Goal Start Date  12/22/18  Interventions for Problem One Long Term Goal  Confirmed member's home health team remains active.  Confirmed wife has contact information for nurses involved in case of emergency  THN  CM Short Term Goal #1   Member's wife will have long term plan for for increased support for member within the next 2 weeks  THN CM Short Term Goal #1 Start Date  12/22/18  Interventions for Short Term Goal #1  Collaborated with CSW regarding contact for long term care.  THN CM Short Term Goal #2   Member's wife will report contact with palliative care team within the next week  THN CM Short Term Goal #2 Start Date  12/22/18  Interventions for Short Term Goal #2  Provided wife with contact information for nurse for [facility], advised to contact as soon as possible to schedule visit     [name], Therapist, sports, MSN [place] Manager [phone]

## 2019-01-06 DIAGNOSIS — E1165 Type 2 diabetes mellitus with hyperglycemia: Secondary | ICD-10-CM | POA: Diagnosis not present

## 2019-01-06 DIAGNOSIS — J189 Pneumonia, unspecified organism: Secondary | ICD-10-CM | POA: Diagnosis not present

## 2019-01-06 DIAGNOSIS — G9341 Metabolic encephalopathy: Secondary | ICD-10-CM | POA: Diagnosis not present

## 2019-01-06 DIAGNOSIS — Z950 Presence of cardiac pacemaker: Secondary | ICD-10-CM | POA: Diagnosis not present

## 2019-01-06 DIAGNOSIS — I2510 Atherosclerotic heart disease of native coronary artery without angina pectoris: Secondary | ICD-10-CM | POA: Diagnosis not present

## 2019-01-06 DIAGNOSIS — Z48812 Encounter for surgical aftercare following surgery on the circulatory system: Secondary | ICD-10-CM | POA: Diagnosis not present

## 2019-01-06 NOTE — Telephone Encounter (Signed)
Wife aware

## 2019-01-08 DIAGNOSIS — Z950 Presence of cardiac pacemaker: Secondary | ICD-10-CM | POA: Diagnosis not present

## 2019-01-08 DIAGNOSIS — J189 Pneumonia, unspecified organism: Secondary | ICD-10-CM | POA: Diagnosis not present

## 2019-01-08 DIAGNOSIS — I2510 Atherosclerotic heart disease of native coronary artery without angina pectoris: Secondary | ICD-10-CM | POA: Diagnosis not present

## 2019-01-08 DIAGNOSIS — G9341 Metabolic encephalopathy: Secondary | ICD-10-CM | POA: Diagnosis not present

## 2019-01-08 DIAGNOSIS — E1165 Type 2 diabetes mellitus with hyperglycemia: Secondary | ICD-10-CM | POA: Diagnosis not present

## 2019-01-08 DIAGNOSIS — Z48812 Encounter for surgical aftercare following surgery on the circulatory system: Secondary | ICD-10-CM | POA: Diagnosis not present

## 2019-01-11 ENCOUNTER — Other Ambulatory Visit: Payer: Self-pay | Admitting: *Deleted

## 2019-01-13 ENCOUNTER — Other Ambulatory Visit: Payer: Self-pay | Admitting: *Deleted

## 2019-01-13 ENCOUNTER — Encounter: Payer: Self-pay | Admitting: *Deleted

## 2019-01-13 DIAGNOSIS — G9341 Metabolic encephalopathy: Secondary | ICD-10-CM | POA: Diagnosis not present

## 2019-01-13 DIAGNOSIS — E1165 Type 2 diabetes mellitus with hyperglycemia: Secondary | ICD-10-CM | POA: Diagnosis not present

## 2019-01-13 DIAGNOSIS — J189 Pneumonia, unspecified organism: Secondary | ICD-10-CM | POA: Diagnosis not present

## 2019-01-13 DIAGNOSIS — I2510 Atherosclerotic heart disease of native coronary artery without angina pectoris: Secondary | ICD-10-CM | POA: Diagnosis not present

## 2019-01-13 DIAGNOSIS — Z48812 Encounter for surgical aftercare following surgery on the circulatory system: Secondary | ICD-10-CM | POA: Diagnosis not present

## 2019-01-13 DIAGNOSIS — Z950 Presence of cardiac pacemaker: Secondary | ICD-10-CM | POA: Diagnosis not present

## 2019-01-13 NOTE — Patient Outreach (Signed)
[facility]) Care Management [name], Transition of Care  Unsuccessful consecutive outreach attempt # 1- previously engaged patient  01/13/2019  Jaime [name] [birth year] [id number]  Unsuccessful outgoing telephone outreach attempt to patient/ caregiver for transition of care, in coverage for patient's primary [name].  Attempted phone calls to both phone numbers listed in EMR for patient/ caregiver- spouse; both attempts were unsuccessful.  HIPAA compliant voice mail messages left on both phone numbers listed for patient/ caregiver- spouse, requesting return call back.  Plan:  Will make patient's primary [place] RN CM aware of unsuccessful call attempts if patient/ caregiver does not return my call later this week  Oneta Rack, RN, BSN, [place] Care Management  [phone]

## 2019-01-14 DIAGNOSIS — E1165 Type 2 diabetes mellitus with hyperglycemia: Secondary | ICD-10-CM | POA: Diagnosis not present

## 2019-01-14 DIAGNOSIS — Z7902 Long term (current) use of antithrombotics/antiplatelets: Secondary | ICD-10-CM | POA: Diagnosis not present

## 2019-01-14 DIAGNOSIS — G894 Chronic pain syndrome: Secondary | ICD-10-CM | POA: Diagnosis not present

## 2019-01-14 DIAGNOSIS — I509 Heart failure, unspecified: Secondary | ICD-10-CM | POA: Diagnosis not present

## 2019-01-14 DIAGNOSIS — K219 Gastro-esophageal reflux disease without esophagitis: Secondary | ICD-10-CM | POA: Diagnosis not present

## 2019-01-14 DIAGNOSIS — Z0000 Encounter for general adult medical examination without abnormal findings: Secondary | ICD-10-CM | POA: Diagnosis not present

## 2019-01-14 DIAGNOSIS — I5022 Chronic systolic (congestive) heart failure: Secondary | ICD-10-CM | POA: Diagnosis not present

## 2019-01-15 DIAGNOSIS — I2510 Atherosclerotic heart disease of native coronary artery without angina pectoris: Secondary | ICD-10-CM | POA: Diagnosis not present

## 2019-01-15 DIAGNOSIS — Z48812 Encounter for surgical aftercare following surgery on the circulatory system: Secondary | ICD-10-CM | POA: Diagnosis not present

## 2019-01-15 DIAGNOSIS — J189 Pneumonia, unspecified organism: Secondary | ICD-10-CM | POA: Diagnosis not present

## 2019-01-15 DIAGNOSIS — E1165 Type 2 diabetes mellitus with hyperglycemia: Secondary | ICD-10-CM | POA: Diagnosis not present

## 2019-01-15 DIAGNOSIS — G9341 Metabolic encephalopathy: Secondary | ICD-10-CM | POA: Diagnosis not present

## 2019-01-15 DIAGNOSIS — Z950 Presence of cardiac pacemaker: Secondary | ICD-10-CM | POA: Diagnosis not present

## 2019-01-16 DIAGNOSIS — G9341 Metabolic encephalopathy: Secondary | ICD-10-CM | POA: Diagnosis not present

## 2019-01-16 DIAGNOSIS — Z48812 Encounter for surgical aftercare following surgery on the circulatory system: Secondary | ICD-10-CM | POA: Diagnosis not present

## 2019-01-16 DIAGNOSIS — N400 Enlarged prostate without lower urinary tract symptoms: Secondary | ICD-10-CM | POA: Diagnosis not present

## 2019-01-16 DIAGNOSIS — E1151 Type 2 diabetes mellitus with diabetic peripheral angiopathy without gangrene: Secondary | ICD-10-CM | POA: Diagnosis not present

## 2019-01-16 DIAGNOSIS — Z89612 Acquired absence of left leg above knee: Secondary | ICD-10-CM | POA: Diagnosis not present

## 2019-01-16 DIAGNOSIS — Z950 Presence of cardiac pacemaker: Secondary | ICD-10-CM | POA: Diagnosis not present

## 2019-01-16 DIAGNOSIS — I5042 Chronic combined systolic (congestive) and diastolic (congestive) heart failure: Secondary | ICD-10-CM | POA: Diagnosis not present

## 2019-01-16 DIAGNOSIS — Z87440 Personal history of urinary (tract) infections: Secondary | ICD-10-CM | POA: Diagnosis not present

## 2019-01-16 DIAGNOSIS — J189 Pneumonia, unspecified organism: Secondary | ICD-10-CM | POA: Diagnosis not present

## 2019-01-16 DIAGNOSIS — Z7401 Bed confinement status: Secondary | ICD-10-CM | POA: Diagnosis not present

## 2019-01-16 DIAGNOSIS — I110 Hypertensive heart disease with heart failure: Secondary | ICD-10-CM | POA: Diagnosis not present

## 2019-01-16 DIAGNOSIS — I272 Pulmonary hypertension, unspecified: Secondary | ICD-10-CM | POA: Diagnosis not present

## 2019-01-16 DIAGNOSIS — E1165 Type 2 diabetes mellitus with hyperglycemia: Secondary | ICD-10-CM | POA: Diagnosis not present

## 2019-01-16 DIAGNOSIS — I441 Atrioventricular block, second degree: Secondary | ICD-10-CM | POA: Diagnosis not present

## 2019-01-16 DIAGNOSIS — G35 Multiple sclerosis: Secondary | ICD-10-CM | POA: Diagnosis not present

## 2019-01-16 DIAGNOSIS — G546 Phantom limb syndrome with pain: Secondary | ICD-10-CM | POA: Diagnosis not present

## 2019-01-16 DIAGNOSIS — F0280 Dementia in other diseases classified elsewhere without behavioral disturbance: Secondary | ICD-10-CM | POA: Diagnosis not present

## 2019-01-16 DIAGNOSIS — I2510 Atherosclerotic heart disease of native coronary artery without angina pectoris: Secondary | ICD-10-CM | POA: Diagnosis not present

## 2019-01-16 DIAGNOSIS — Z89611 Acquired absence of right leg above knee: Secondary | ICD-10-CM | POA: Diagnosis not present

## 2019-01-17 DIAGNOSIS — J189 Pneumonia, unspecified organism: Secondary | ICD-10-CM | POA: Diagnosis not present

## 2019-01-19 ENCOUNTER — Other Ambulatory Visit: Payer: Self-pay | Admitting: *Deleted

## 2019-01-19 DIAGNOSIS — G9341 Metabolic encephalopathy: Secondary | ICD-10-CM | POA: Diagnosis not present

## 2019-01-19 DIAGNOSIS — Z950 Presence of cardiac pacemaker: Secondary | ICD-10-CM | POA: Diagnosis not present

## 2019-01-19 DIAGNOSIS — Z48812 Encounter for surgical aftercare following surgery on the circulatory system: Secondary | ICD-10-CM | POA: Diagnosis not present

## 2019-01-19 DIAGNOSIS — E1165 Type 2 diabetes mellitus with hyperglycemia: Secondary | ICD-10-CM | POA: Diagnosis not present

## 2019-01-19 DIAGNOSIS — I2510 Atherosclerotic heart disease of native coronary artery without angina pectoris: Secondary | ICD-10-CM | POA: Diagnosis not present

## 2019-01-19 DIAGNOSIS — J189 Pneumonia, unspecified organism: Secondary | ICD-10-CM | POA: Diagnosis not present

## 2019-01-19 NOTE — Patient Outreach (Signed)
[place] [facility]) Care Management  01/19/2019  Jaime [name] 04-05-1941 [id number]   Weekly transition of care call placed to member's caregiver/wife, both listed home number and cell numbers were unsuccessful.  HIPAA compliant voice message left on both lines. Unsuccessful outreach letter sent as this is second unsuccessful outreach attempt.  Will follow up within the next 3-4 business days.  [name], [place], MSN [place] [phone]

## 2019-01-19 NOTE — Patient Outreach (Signed)
[facility]) Care Management  01/11/2019  Jaime [name] 1941/03/28 [id number]   CSW called & spoke with patient's wife, Jaime [name] to discuss referral received from [place], [facility] regarding need for long term plan for patient. Patient's wife informed CSW that currently through the [place] - aid & attendance, have someone that comes out Monday - Friday 2 hours a day/10 hours a week which is the maximum allowance to assist patient with getting him up & ready in the mornings and cleaning around the house. Wife states that patient is 100% service connected with the [facility] and would prefer that when he does need to go to a higher level of care/nursing home, they would prefer that he go through the [place] as they have been told they would not qualify for Medicaid. CSW provided patient's wife with the phone number to the [facility] (ph#: [phone]) to request information on how to get that process started as it usually is a lengthy process. Patient's wife states that for now, him living at home with the assistance that they receive suffices for now but she just understands that he will eventually require more assistance. CSW also spoke with patient's wife about advance directives, informed her that we have living will & healthcare power of attorney packet we could mail her - patient's wife was appreciative and plans to speak with an elderlaw attorney about financial power of attorney and will. CSW will follow-up with patient's wife in 2 weeks to ensure that she has received paperwork and was able to make contact with the [facility].    Raynaldo Opitz, LCSW [facility]  Clinical Social Worker cell #: [phone]

## 2019-01-20 DIAGNOSIS — Z48812 Encounter for surgical aftercare following surgery on the circulatory system: Secondary | ICD-10-CM | POA: Diagnosis not present

## 2019-01-20 DIAGNOSIS — G9341 Metabolic encephalopathy: Secondary | ICD-10-CM | POA: Diagnosis not present

## 2019-01-20 DIAGNOSIS — I2510 Atherosclerotic heart disease of native coronary artery without angina pectoris: Secondary | ICD-10-CM | POA: Diagnosis not present

## 2019-01-20 DIAGNOSIS — J189 Pneumonia, unspecified organism: Secondary | ICD-10-CM | POA: Diagnosis not present

## 2019-01-20 DIAGNOSIS — Z950 Presence of cardiac pacemaker: Secondary | ICD-10-CM | POA: Diagnosis not present

## 2019-01-20 DIAGNOSIS — E1165 Type 2 diabetes mellitus with hyperglycemia: Secondary | ICD-10-CM | POA: Diagnosis not present

## 2019-01-22 DIAGNOSIS — Z950 Presence of cardiac pacemaker: Secondary | ICD-10-CM | POA: Diagnosis not present

## 2019-01-22 DIAGNOSIS — I2510 Atherosclerotic heart disease of native coronary artery without angina pectoris: Secondary | ICD-10-CM | POA: Diagnosis not present

## 2019-01-22 DIAGNOSIS — J189 Pneumonia, unspecified organism: Secondary | ICD-10-CM | POA: Diagnosis not present

## 2019-01-22 DIAGNOSIS — G9341 Metabolic encephalopathy: Secondary | ICD-10-CM | POA: Diagnosis not present

## 2019-01-22 DIAGNOSIS — E1165 Type 2 diabetes mellitus with hyperglycemia: Secondary | ICD-10-CM | POA: Diagnosis not present

## 2019-01-22 DIAGNOSIS — Z48812 Encounter for surgical aftercare following surgery on the circulatory system: Secondary | ICD-10-CM | POA: Diagnosis not present

## 2019-01-25 ENCOUNTER — Other Ambulatory Visit: Payer: Self-pay | Admitting: *Deleted

## 2019-01-25 NOTE — Patient Outreach (Signed)
[facility]) Care Management  01/25/2019  Jaime [name] [birth year] [id number]   Weekly transition of care call placed to member's caregiver/wife, third attempt successful.  Wife report member is "coming along."  State he remains active with current in home aide services as well as [facility] with nursing, PT & OT.  She state his blood pressure and blood sugars have been  Well managed, blood pressure consistently 100s/60s and blood sugars 100s-110s (reported less than 200).    Inquired about contact with [facility] social worker for long term support.  Confirms she has been spoken to [place] and was provided contact information for the [facility]  She has not contacted them yet to begin process, but will do so this week.  Advised that [facility] CSW will follow up next week on progress with long term planning.  This care manager also inquired about contact with palliative care through [place] as last note from B. Senor (RN for [facility]) indicate voice message left in attempt to schedule visit.  Wife admits that she has not been able to schedule visit yet but grants permission for this care manager to follow up.  She denies any urgent concerns at this time, advised to contact this care manager with questions.  Call placed to [facility] to follow up on referral for palliative care.  Voice message left, will await call back.  If referral closed due to inability to contact, will place referral to [facility].    Will follow up with [facility] within the next 3-4 business days.   Will follow up with member's wife within the next 2 weeks.  [facility] CM Care Plan Problem One     Most Recent Value  Care Plan Problem One  Risk for readmission as evidenced by recent hospitalization  Role Documenting the Problem One  Care Management [place] for Problem One  Active  [facility] Long Term Goal   Member will not be readmitted to hospital within the next 31 days  [facility] Long Term Goal  Start Date  12/22/18  [facility] Long Term Goal Met Date  01/25/19  [facility] CM Short Term Goal #1   Member's wife will have long term plan for for increased support for member within the next 2 weeks  [facility] CM Short Term Goal #1 Start Date  12/22/18  [facility] CM Short Term Goal #1 Met Date  01/25/19  [facility] CM Short Term Goal #2   Member's wife will report contact with palliative care team within the next week  [facility] CM Short Term Goal #2 Start Date  12/22/18  Inter[facility] for Short Term Goal #2  Call placed to [facility] to follow up on referral progress     [name], Therapist, sports, MSN [place] Manager [phone]

## 2019-01-26 ENCOUNTER — Telehealth: Payer: Self-pay

## 2019-01-26 DIAGNOSIS — I2510 Atherosclerotic heart disease of native coronary artery without angina pectoris: Secondary | ICD-10-CM | POA: Diagnosis not present

## 2019-01-26 DIAGNOSIS — Z48812 Encounter for surgical aftercare following surgery on the circulatory system: Secondary | ICD-10-CM | POA: Diagnosis not present

## 2019-01-26 DIAGNOSIS — Z950 Presence of cardiac pacemaker: Secondary | ICD-10-CM | POA: Diagnosis not present

## 2019-01-26 DIAGNOSIS — J189 Pneumonia, unspecified organism: Secondary | ICD-10-CM | POA: Diagnosis not present

## 2019-01-26 DIAGNOSIS — E1165 Type 2 diabetes mellitus with hyperglycemia: Secondary | ICD-10-CM | POA: Diagnosis not present

## 2019-01-26 DIAGNOSIS — G9341 Metabolic encephalopathy: Secondary | ICD-10-CM | POA: Diagnosis not present

## 2019-01-26 NOTE — Telephone Encounter (Signed)
Phone call placed to patient's wife to check in and to offer to schedule a visit with Palliative Care. Wife requested a telephone visit. Scheduled for 01/29/2019 @ 11:00am

## 2019-01-29 ENCOUNTER — Other Ambulatory Visit: Payer: Medicare Other | Admitting: Adult Health Nurse Practitioner

## 2019-01-29 ENCOUNTER — Other Ambulatory Visit: Payer: Self-pay

## 2019-01-29 DIAGNOSIS — Z515 Encounter for palliative care: Secondary | ICD-10-CM

## 2019-01-29 DIAGNOSIS — I2510 Atherosclerotic heart disease of native coronary artery without angina pectoris: Secondary | ICD-10-CM | POA: Diagnosis not present

## 2019-01-29 DIAGNOSIS — E1165 Type 2 diabetes mellitus with hyperglycemia: Secondary | ICD-10-CM | POA: Diagnosis not present

## 2019-01-29 DIAGNOSIS — Z950 Presence of cardiac pacemaker: Secondary | ICD-10-CM | POA: Diagnosis not present

## 2019-01-29 DIAGNOSIS — Z48812 Encounter for surgical aftercare following surgery on the circulatory system: Secondary | ICD-10-CM | POA: Diagnosis not present

## 2019-01-29 DIAGNOSIS — J189 Pneumonia, unspecified organism: Secondary | ICD-10-CM | POA: Diagnosis not present

## 2019-01-29 DIAGNOSIS — G9341 Metabolic encephalopathy: Secondary | ICD-10-CM | POA: Diagnosis not present

## 2019-01-31 NOTE — Progress Notes (Signed)
[place] Consult Note Telephone: [phone]  Fax: [phone]  PATIENT NAME: Jaime [name] DOB: [birth year] MRN: [id number]  PRIMARY CARE PROVIDER:   [name], MD  REFERRING PROVIDER:  [name], MD [place],  [place] [address]  RESPONSIBLE PARTY:   Jaime [name], wife [phone]  Due to the COVID-19 crisis, this visit was done via telemedicine and it was initiated and consent by this patient and or family. Video-audio (telehealth) contact was unable to be done due to technical barriers from the patient's side.    RECOMMENDATIONS and PLAN:  1.  Dementia/MS.  FAST 6d.  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  Wife states that he is transferred with lift.  He is unable to sit up without support in his wheelchair.  Limits time in chair due pain on tailbone when sits up for too long.  He is  incontinent of B&B.  Appetite is good with no weight changes. Does state that he needs to drink more water No wounds. Is still able to hold a conversation but is confused and is A&O to person and place.    2.  Patient was in hospital 4/27-4/30/2020 for pneumonia and was discharged to SNF for rehab.  Was discharged from SNF on 12/15/2018.  Wife states that he is doing better than the first two weeks when he came home.  Patient has phantom pain that is well controlled with voltaren gel, lyrica, and tramadol.  No new concerns reported by wife today.  3.  Goals of care.  Patient full code.  Wife states that they do have living will at [place] office.  Agreed to having MOST form and palliative information mailed for her to go over with her husband.  Have in person visit in one week to go over.  I spent 30 minutes providing this consultation,  From 11:00 to 11:30. More than 50% of the time in this consultation was spent coordinating communication.   HISTORY OF PRESENT ILLNESS:  Jaime [name] is a 78 y.o. year old male  with multiple medical problems including Dementia, DMT2, CAD, PVK, CHF, MS, has pacemaker placement. Palliative Care was asked to help address goals of care.   CODE STATUS: Full Code  PPS: 40% HOSPICE ELIGIBILITY/DIAGNOSIS: TBD  PAST MEDICAL HISTORY:  Past Medical History:  Diagnosis Date  . Atherosclerotic PVD with ulceration ([place])    left foot  . Bacteremia   . CAD (coronary artery disease) 2007   Dr Meda Coffee -  non-STEMI, .occluded circumflex.. Taxus stent placed...residual 80% LAD...50% RCA  . Cardiomyopathy, ischemic    EF 45% per ECHO 2008  //   EF 25%, echo, August, 2013 //  Echo (8/15):  Mild LVH, EF 30-35%, ant-lat and lat AK, inf HK, Gr 1 DD, mild MR, mild LAE  . Carotid artery disease ([place])    a.  Doppler, February, 2012, 0-39% bilateral,Mild smooth plaque;  b.  Carotid US (8/15):  Bilateral 1-39% ICA >>> F/u 2 years  . Constipation   . Dementia ([place])   . Diabetes mellitus    INSULIN-DEPENDent  . Fatigue SEVERE  . H/O pleural effusion 2008   POST THORACENTESIS  . History of colon polyps PRECANCEROUS  . Hyperlipidemia   . Hypertension   . Impotence   . Increased prostate specific antigen (PSA) velocity   . Insomnia   . Lung nodule    resolved 11-2006 CT Chest  .  Multiple sclerosis ([place]) [place] -- LAST VISIT 11-20-2010  NOTE W/ CHART  . PAC (premature atrial contraction)    December, 2013  . Pulmonary hypertension ([place])    moderate ECHO Jan 2008  . Scoliosis associated with other condition   . Second degree Mobitz I AV block 11/12/2018  . Sleep apnea   . Symptomatic bradycardia 11/13/2018  . Systolic heart failure   . TIA (transient ischemic attack)   . Tobacco abuse    quit   . Urinary retention    dx ~ 2-12, like from [place], now with a catheter, saw urology  . Urinary tract infection    hx of    SOCIAL HX:  Social History   Tobacco Use  . Smoking status: Former Smoker    Years: 50.00    Quit date: 07/22/2012    Years since  quitting: 6.5  . Smokeless tobacco: Current User  . Tobacco comment: pt states that he is using E-cigs  Substance Use Topics  . Alcohol use: No    Alcohol/week: 0.0 standard drinks    ALLERGIES:  Allergies  Allergen Reactions  . Bee Venom Anaphylaxis  . Influenza Vaccines Other (See Comments)    Made patient "sick for months"  . Atorvastatin Other (See Comments)    Severe pain in stumps  . Latex Rash     PERTINENT MEDICATIONS:  Outpatient Encounter Medications as of 01/29/2019  Medication Sig  . albuterol (PROVENTIL HFA;VENTOLIN HFA) 108 (90 Base) MCG/ACT inhaler Inhale 2 puffs into the lungs every 6 (six) hours as needed for wheezing or shortness of breath.  Marland Kitchen amoxicillin (AMOXIL) 500 MG tablet Take 4 tablets (2,000 mg) 30 minutes prior to dental procedure  . aspirin EC 81 MG tablet Take 1 tablet (81 mg total) by mouth daily.  Marland Kitchen azelastine (ASTELIN) 0.1 % nasal spray Place 2 sprays into both nostrils at bedtime as needed for rhinitis. Use in each nostril as directed (Patient not taking: Reported on 03/27/2018)  . baclofen (LIORESAL) 10 MG tablet Take 0.5 tablets (5 mg total) by mouth 2 (two) times daily.  . carvedilol (COREG) 3.125 MG tablet Take 1 tablet (3.125 mg total) by mouth 2 (two) times daily with a meal.  . cetirizine (ZYRTEC) 10 MG tablet Take 10 mg by mouth daily.  . Cholecalciferol (VITAMIN D-3) 1000 units CAPS Take 2,000 Units by mouth daily.  . diclofenac sodium (VOLTAREN) 1 % GEL Apply 1 application topically 2 (two) times daily as needed (for pain).   . furosemide (LASIX) 20 MG tablet Take 1 tablet (20 mg total) by mouth daily.  Marland Kitchen glucagon (GLUCAGEN) 1 MG SOLR injection Inject 1 mg into the muscle once as needed for low blood sugar. Reported on 11/16/2015  . guaiFENesin (ROBITUSSIN) 100 MG/5ML SOLN Take 5 mLs by mouth every 4 (four) hours as needed for cough or to loosen phlegm.  . insulin aspart protamine - aspart (NOVOLOG 70/30 MIX) (70-30) 100 UNIT/ML FlexPen Inject  0.05 mLs (5 Units total) into the skin 2 (two) times daily. (Patient taking differently: Inject 5 Units into the skin 2 (two) times daily as needed (CBG >100). )  . lisinopril (PRINIVIL,ZESTRIL) 10 MG tablet TAKE 1 TABLET BY MOUTH EVERY DAY (Patient taking differently: Take 10 mg by mouth daily. )  . Multiple Vitamins-Minerals (MULTIVITAMIN WITH MINERALS) tablet Take 1 tablet by mouth daily.  . pantoprazole (PROTONIX) 40 MG tablet Take 1 tablet (40 mg total) by mouth daily  with lunch.  . pregabalin (LYRICA) 150 MG capsule Take 150-300 mg by mouth See admin instructions. Take one capsule (150 mg) by mouth in the morning and two capsules (300 mg) at bedtime  . Propylene Glycol (SYSTANE BALANCE OP) Place 1-2 drops into both eyes daily as needed (dry eyes).   . rosuvastatin (CRESTOR) 20 MG tablet TAKE 1 TABLET(20 MG) BY MOUTH DAILY (Patient taking differently: Take 20 mg by mouth daily. )  . traMADol (ULTRAM) 50 MG tablet Take 1 tablet (50 mg total) by mouth every 4 (four) hours as needed (for pain).  . traZODone (DESYREL) 100 MG tablet Take 100 mg by mouth at bedtime as needed for sleep.   No facility-administered encounter medications on file as of 01/29/2019.      Amy Jenetta Downer, NP

## 2019-02-01 ENCOUNTER — Other Ambulatory Visit: Payer: Self-pay | Admitting: Neurology

## 2019-02-02 ENCOUNTER — Ambulatory Visit: Payer: Self-pay | Admitting: *Deleted

## 2019-02-02 DIAGNOSIS — J189 Pneumonia, unspecified organism: Secondary | ICD-10-CM | POA: Diagnosis not present

## 2019-02-02 DIAGNOSIS — Z950 Presence of cardiac pacemaker: Secondary | ICD-10-CM | POA: Diagnosis not present

## 2019-02-02 DIAGNOSIS — I2510 Atherosclerotic heart disease of native coronary artery without angina pectoris: Secondary | ICD-10-CM | POA: Diagnosis not present

## 2019-02-02 DIAGNOSIS — Z48812 Encounter for surgical aftercare following surgery on the circulatory system: Secondary | ICD-10-CM | POA: Diagnosis not present

## 2019-02-02 DIAGNOSIS — E1165 Type 2 diabetes mellitus with hyperglycemia: Secondary | ICD-10-CM | POA: Diagnosis not present

## 2019-02-02 DIAGNOSIS — G9341 Metabolic encephalopathy: Secondary | ICD-10-CM | POA: Diagnosis not present

## 2019-02-03 DIAGNOSIS — J189 Pneumonia, unspecified organism: Secondary | ICD-10-CM | POA: Diagnosis not present

## 2019-02-03 DIAGNOSIS — Z48812 Encounter for surgical aftercare following surgery on the circulatory system: Secondary | ICD-10-CM | POA: Diagnosis not present

## 2019-02-03 DIAGNOSIS — E1165 Type 2 diabetes mellitus with hyperglycemia: Secondary | ICD-10-CM | POA: Diagnosis not present

## 2019-02-03 DIAGNOSIS — G9341 Metabolic encephalopathy: Secondary | ICD-10-CM | POA: Diagnosis not present

## 2019-02-03 DIAGNOSIS — I2510 Atherosclerotic heart disease of native coronary artery without angina pectoris: Secondary | ICD-10-CM | POA: Diagnosis not present

## 2019-02-03 DIAGNOSIS — Z950 Presence of cardiac pacemaker: Secondary | ICD-10-CM | POA: Diagnosis not present

## 2019-02-04 ENCOUNTER — Telehealth: Payer: Self-pay | Admitting: Adult Health Nurse Practitioner

## 2019-02-04 NOTE — Telephone Encounter (Signed)
Had appointment at 10 am.  Wife called to reschedule.  Rescheduled for 02/11/2019 at [place]. Olena Heckle NP

## 2019-02-09 ENCOUNTER — Other Ambulatory Visit: Payer: Self-pay | Admitting: *Deleted

## 2019-02-09 DIAGNOSIS — I2510 Atherosclerotic heart disease of native coronary artery without angina pectoris: Secondary | ICD-10-CM | POA: Diagnosis not present

## 2019-02-09 DIAGNOSIS — J189 Pneumonia, unspecified organism: Secondary | ICD-10-CM | POA: Diagnosis not present

## 2019-02-09 DIAGNOSIS — E1165 Type 2 diabetes mellitus with hyperglycemia: Secondary | ICD-10-CM | POA: Diagnosis not present

## 2019-02-09 DIAGNOSIS — G9341 Metabolic encephalopathy: Secondary | ICD-10-CM | POA: Diagnosis not present

## 2019-02-09 DIAGNOSIS — Z48812 Encounter for surgical aftercare following surgery on the circulatory system: Secondary | ICD-10-CM | POA: Diagnosis not present

## 2019-02-09 DIAGNOSIS — Z950 Presence of cardiac pacemaker: Secondary | ICD-10-CM | POA: Diagnosis not present

## 2019-02-09 NOTE — Patient Outreach (Signed)
[facility]) Care Management  02/09/2019  Jaime [name] 02/01/1941 [id number]   Call placed to member's caregiver/wife [name] to follow up on current medical condition.  She report she is busy trying to get member his breakfast and that he has an appointment in 15 minutes (someone visiting the home).  Request for wife to call this care manager back.  Noted per chart palliative care was able to complete tele-visit on 7/10 and was scheduled to make home visit on 7/16 but was rescheduled for 7/23.  Will allow time for home visit evaluation from [place], will follow up with wife within the next week.    [name], [place], MSN [place] [phone]

## 2019-02-11 ENCOUNTER — Telehealth: Payer: Self-pay

## 2019-02-11 ENCOUNTER — Other Ambulatory Visit: Payer: Self-pay

## 2019-02-11 ENCOUNTER — Other Ambulatory Visit: Payer: Medicare Other | Admitting: Adult Health Nurse Practitioner

## 2019-02-11 DIAGNOSIS — Z515 Encounter for palliative care: Secondary | ICD-10-CM

## 2019-02-11 NOTE — Telephone Encounter (Signed)
    COVID-19 Pre-Screening Questions:  . In the past 7 to 10 days have you had a cough,  shortness of breath, headache, congestion, fever (100 or greater) body aches, chills, sore throat, or sudden loss of taste or sense of smell? . Have you been around anyone with known Covid 19. . Have you been around anyone who is awaiting Covid 19 test results in the past 7 to 10 days? . Have you been around anyone who has been exposed to Covid 19, or has mentioned symptoms of Covid 19 within the past 7 to 10 days?  If you have any concerns/questions about symptoms patients report during screening (either on the phone or at [facility]). Contact the provider seeing the patient or DOD for further guidance.  If neither are available contact a member of the leadership team.       I called and spoke with patients wife Jaime [name], ok per patient DPR. Jaime [name] answered no to all screening questions for herself and patent, she will be with him tomorrow.

## 2019-02-11 NOTE — Progress Notes (Addendum)
[facility] Consult Note Telephone: [phone]  Fax: [phone]  PATIENT NAME: Jaime [name] DOB: [birth year] MRN: [id number]  PRIMARY CARE PROVIDER:   [name], MD  REFERRING PROVIDER:  [name], MD [place],  [place] [address]  RESPONSIBLE PARTY:   Ilario Dhaliwal, wife [phone]     RECOMMENDATIONS and PLAN:  1.  Dementia/MS.  FAST 6d.  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  He is transferred with lift.  He is unable to sit up without support in his wheelchair.  Limits time in chair due pain on tailbone when sits up for too long.  He is  incontinent of B&B.  Appetite is good with no weight changes. No wounds. Is still able to hold a conversation but is confused and is A&O to person and place.   2. Phantom pain.  Patient gets phantom at times due to bilateral BKA.  Mostly well controlled and does not bother him most days.  Will have his bad days.  Wife states that sometimes repositioning helps and he also uses voltaren gel, lyrica, and tramadol with relief.  Continue current pain management  3.  Goals of care.  Filled out MOST form today with Full Code status.  Only wants CPR done once and if unsuccessful then STOP.  Wants full hospital interventions, antibiotics and IV fluids.  Wants feeding tube for trial period.  Uploaded to [facility] and left original with patient/wife.  Wife and patient have no concerns today.  Continue current POC.  Have next appointment in 2 months.  I spent 40 minutes providing this consultation,  from 10:00 to 10:40. More than 50% of the time in this consultation was spent coordinating communication.   HISTORY OF PRESENT ILLNESS:  Jaime [name] is a 78 y.o. year old male with multiple medical problems including Dementia, DMT2, CAD, PVK, CHF, MS, has pacemaker placement. Palliative Care was asked to help address goals of care.   CODE STATUS: Full Code (attempt CPR once if  unsuccessful STOP)  PPS: 0% HOSPICE ELIGIBILITY/DIAGNOSIS: TBD  PHYSICAL EXAM:   General: patient lying in bed in NAD Cardiovascular: regular rate and rhythm Pulmonary: lung sounds clear; normal respiratory effort Abdomen: soft, nontender, + bowel sounds GU: no suprapubic tenderness;  Patient does have foley catheter with clear medium yellow urine noted in bag and tubing Extremities: no edema, Bilateral BKA Skin: no rashes Neurological: Weakness but otherwise nonfocal; forgetfulness  PAST MEDICAL HISTORY:  Past Medical History:  Diagnosis Date  . Atherosclerotic PVD with ulceration ([place])    left foot  . Bacteremia   . CAD (coronary artery disease) 2007   Dr Meda Coffee -  non-STEMI, .occluded circumflex.. Taxus stent placed...residual 80% LAD...50% RCA  . Cardiomyopathy, ischemic    EF 45% per ECHO 2008  //   EF 25%, echo, August, 2013 //  Echo (8/15):  Mild LVH, EF 30-35%, ant-lat and lat AK, inf HK, Gr 1 DD, mild MR, mild LAE  . Carotid artery disease ([place])    a.  Doppler, February, 2012, 0-39% bilateral,Mild smooth plaque;  b.  Carotid US (8/15):  Bilateral 1-39% ICA >>> F/u 2 years  . Constipation   . Dementia ([place])   . Diabetes mellitus    INSULIN-DEPENDent  . Fatigue SEVERE  . H/O pleural effusion 2008   POST THORACENTESIS  . History of colon polyps PRECANCEROUS  . Hyperlipidemia   . Hypertension   .  Impotence   . Increased prostate specific antigen (PSA) velocity   . Insomnia   . Lung nodule    resolved 11-2006 CT Chest  . Multiple sclerosis ([place]) [place] -- LAST VISIT 11-20-2010  NOTE W/ CHART  . PAC (premature atrial contraction)    December, 2013  . Pulmonary hypertension ([place])    moderate ECHO Jan 2008  . Scoliosis associated with other condition   . Second degree Mobitz I AV block 11/12/2018  . Sleep apnea   . Symptomatic bradycardia 11/13/2018  . Systolic heart failure   . TIA (transient ischemic attack)   . Tobacco abuse     quit   . Urinary retention    dx ~ 2-12, like from [place], now with a catheter, saw urology  . Urinary tract infection    hx of    SOCIAL HX:  Social History   Tobacco Use  . Smoking status: Former Smoker    Years: 50.00    Quit date: 07/22/2012    Years since quitting: 6.5  . Smokeless tobacco: Current User  . Tobacco comment: pt states that he is using E-cigs  Substance Use Topics  . Alcohol use: No    Alcohol/week: 0.0 standard drinks    ALLERGIES:  Allergies  Allergen Reactions  . Bee Venom Anaphylaxis  . Influenza Vaccines Other (See Comments)    Made patient "sick for months"  . Atorvastatin Other (See Comments)    Severe pain in stumps  . Latex Rash     PERTINENT MEDICATIONS:  Outpatient Encounter Medications as of 02/11/2019  Medication Sig  . albuterol (PROVENTIL HFA;VENTOLIN HFA) 108 (90 Base) MCG/ACT inhaler Inhale 2 puffs into the lungs every 6 (six) hours as needed for wheezing or shortness of breath.  Marland Kitchen amoxicillin (AMOXIL) 500 MG tablet Take 4 tablets (2,000 mg) 30 minutes prior to dental procedure  . aspirin EC 81 MG tablet Take 1 tablet (81 mg total) by mouth daily.  Marland Kitchen azelastine (ASTELIN) 0.1 % nasal spray Place 2 sprays into both nostrils at bedtime as needed for rhinitis. Use in each nostril as directed (Patient not taking: Reported on 03/27/2018)  . baclofen (LIORESAL) 10 MG tablet Take 0.5 tablets (5 mg total) by mouth 2 (two) times daily.  . carvedilol (COREG) 3.125 MG tablet Take 1 tablet (3.125 mg total) by mouth 2 (two) times daily with a meal.  . cetirizine (ZYRTEC) 10 MG tablet Take 10 mg by mouth daily.  . Cholecalciferol (VITAMIN D-3) 1000 units CAPS Take 2,000 Units by mouth daily.  . diclofenac sodium (VOLTAREN) 1 % GEL Apply 1 application topically 2 (two) times daily as needed (for pain).   . furosemide (LASIX) 20 MG tablet Take 1 tablet (20 mg total) by mouth daily.  Marland Kitchen glucagon (GLUCAGEN) 1 MG SOLR injection Inject 1 mg into the muscle once as  needed for low blood sugar. Reported on 11/16/2015  . guaiFENesin (ROBITUSSIN) 100 MG/5ML SOLN Take 5 mLs by mouth every 4 (four) hours as needed for cough or to loosen phlegm.  . insulin aspart protamine - aspart (NOVOLOG 70/30 MIX) (70-30) 100 UNIT/ML FlexPen Inject 0.05 mLs (5 Units total) into the skin 2 (two) times daily. (Patient taking differently: Inject 5 Units into the skin 2 (two) times daily as needed (CBG >100). )  . lisinopril (PRINIVIL,ZESTRIL) 10 MG tablet TAKE 1 TABLET BY MOUTH EVERY DAY (Patient taking differently: Take 10 mg by mouth daily. )  .  Multiple Vitamins-Minerals (MULTIVITAMIN WITH MINERALS) tablet Take 1 tablet by mouth daily.  . pantoprazole (PROTONIX) 40 MG tablet Take 1 tablet (40 mg total) by mouth daily with lunch.  . pregabalin (LYRICA) 150 MG capsule TAKE ONE CAPSULE BY MOUTH EVERY MORNING AND 2 CAPSULES EVERY NIGHT AT BEDTIME  . Propylene Glycol (SYSTANE BALANCE OP) Place 1-2 drops into both eyes daily as needed (dry eyes).   . rosuvastatin (CRESTOR) 20 MG tablet TAKE 1 TABLET(20 MG) BY MOUTH DAILY (Patient taking differently: Take 20 mg by mouth daily. )  . traMADol (ULTRAM) 50 MG tablet Take 1 tablet (50 mg total) by mouth every 4 (four) hours as needed (for pain).  . traZODone (DESYREL) 100 MG tablet Take 100 mg by mouth at bedtime as needed for sleep.   No facility-administered encounter medications on file as of 02/11/2019.       Amy Jenetta Downer, NP

## 2019-02-12 ENCOUNTER — Ambulatory Visit (INDEPENDENT_AMBULATORY_CARE_PROVIDER_SITE_OTHER): Payer: Medicare Other | Admitting: Internal Medicine

## 2019-02-12 ENCOUNTER — Encounter: Payer: Self-pay | Admitting: Internal Medicine

## 2019-02-12 ENCOUNTER — Ambulatory Visit (INDEPENDENT_AMBULATORY_CARE_PROVIDER_SITE_OTHER): Payer: Medicare Other | Admitting: *Deleted

## 2019-02-12 ENCOUNTER — Other Ambulatory Visit: Payer: Self-pay

## 2019-02-12 DIAGNOSIS — I255 Ischemic cardiomyopathy: Secondary | ICD-10-CM | POA: Diagnosis not present

## 2019-02-12 DIAGNOSIS — I442 Atrioventricular block, complete: Secondary | ICD-10-CM | POA: Diagnosis not present

## 2019-02-12 DIAGNOSIS — Z950 Presence of cardiac pacemaker: Secondary | ICD-10-CM | POA: Insufficient documentation

## 2019-02-12 DIAGNOSIS — I441 Atrioventricular block, second degree: Secondary | ICD-10-CM

## 2019-02-12 LAB — CUP PACEART INCLINIC DEVICE CHECK
Battery Remaining Longevity: 73 mo
Battery Voltage: 2.99 V
Brady Statistic RA Percent Paced: 76 %
Brady Statistic RV Percent Paced: 99.76 %
Date Time Interrogation Session: 20200724153011
Implantable Lead Implant Date: 20200423
Implantable Lead Implant Date: 20200423
Implantable Lead Implant Date: 20200423
Implantable Lead Location: 753858
Implantable Lead Location: 753859
Implantable Lead Location: 753860
Implantable Pulse Generator Implant Date: 20200423
Lead Channel Impedance Value: 375 Ohm
Lead Channel Impedance Value: 525 Ohm
Lead Channel Impedance Value: 550 Ohm
Lead Channel Pacing Threshold Amplitude: 0.5 V
Lead Channel Pacing Threshold Amplitude: 0.625 V
Lead Channel Pacing Threshold Amplitude: 1.75 V
Lead Channel Pacing Threshold Pulse Width: 0.5 ms
Lead Channel Pacing Threshold Pulse Width: 0.5 ms
Lead Channel Pacing Threshold Pulse Width: 0.5 ms
Lead Channel Sensing Intrinsic Amplitude: 10.4 mV
Lead Channel Sensing Intrinsic Amplitude: 3.7 mV
Lead Channel Setting Pacing Amplitude: 1.625
Lead Channel Setting Pacing Amplitude: 2 V
Lead Channel Setting Pacing Amplitude: 2.75 V
Lead Channel Setting Pacing Pulse Width: 0.5 ms
Lead Channel Setting Pacing Pulse Width: 0.5 ms
Lead Channel Setting Sensing Sensitivity: 2 mV
Pulse Gen Serial Number: 9116598

## 2019-02-12 LAB — CUP PACEART REMOTE DEVICE CHECK
Battery Remaining Longevity: 54 mo
Battery Remaining Percentage: 95.5 %
Battery Voltage: 2.99 V
Brady Statistic AP VP Percent: 77 %
Brady Statistic AP VS Percent: 1 %
Brady Statistic AS VP Percent: 23 %
Brady Statistic AS VS Percent: 1 %
Brady Statistic RA Percent Paced: 76 %
Date Time Interrogation Session: 20200724060011
Implantable Lead Implant Date: 20200423
Implantable Lead Implant Date: 20200423
Implantable Lead Implant Date: 20200423
Implantable Lead Location: 753858
Implantable Lead Location: 753859
Implantable Lead Location: 753860
Implantable Pulse Generator Implant Date: 20200423
Lead Channel Impedance Value: 380 Ohm
Lead Channel Impedance Value: 490 Ohm
Lead Channel Impedance Value: 550 Ohm
Lead Channel Pacing Threshold Amplitude: 0.5 V
Lead Channel Pacing Threshold Amplitude: 0.5 V
Lead Channel Pacing Threshold Amplitude: 1.625 V
Lead Channel Pacing Threshold Pulse Width: 0.5 ms
Lead Channel Pacing Threshold Pulse Width: 0.5 ms
Lead Channel Pacing Threshold Pulse Width: 0.5 ms
Lead Channel Sensing Intrinsic Amplitude: 3.4 mV
Lead Channel Sensing Intrinsic Amplitude: 9.2 mV
Lead Channel Setting Pacing Amplitude: 2 V
Lead Channel Setting Pacing Amplitude: 2.625
Lead Channel Setting Pacing Amplitude: 3.5 V
Lead Channel Setting Pacing Pulse Width: 0.5 ms
Lead Channel Setting Pacing Pulse Width: 0.5 ms
Lead Channel Setting Sensing Sensitivity: 2 mV
Pulse Gen Serial Number: 9116598

## 2019-02-12 NOTE — Progress Notes (Signed)
HPI Mr. Bittman returns today for followup of his Biv PPM. He is a pleasant 78 yo man with symptomatic chb, chronic systolic heart failure, severe peripheral vascular disease, s/p Bilateral AKA. He underwent biv PPM insertion 3 months ago due to the above problems. He has done well in the interim, with no chest pain or sob. He is sedentary due to his bilateral AKA's.  Allergies  Allergen Reactions  . Bee Venom Anaphylaxis  . Influenza Vaccines Other (See Comments)    Made patient "sick for months"  . Atorvastatin Other (See Comments)    Severe pain in stumps  . Latex Rash     Current Outpatient Medications  Medication Sig Dispense Refill  . albuterol (PROVENTIL HFA;VENTOLIN HFA) 108 (90 Base) MCG/ACT inhaler Inhale 2 puffs into the lungs every 6 (six) hours as needed for wheezing or shortness of breath. 1 Inhaler 2  . amoxicillin (AMOXIL) 500 MG tablet Take 4 tablets (2,000 mg) 30 minutes prior to dental procedure 4 tablet 0  . aspirin EC 81 MG tablet Take 1 tablet (81 mg total) by mouth daily. 90 tablet 3  . azelastine (ASTELIN) 0.1 % nasal spray Place 2 sprays into both nostrils at bedtime as needed for rhinitis. Use in each nostril as directed 30 mL 3  . baclofen (LIORESAL) 10 MG tablet Take 0.5 tablets (5 mg total) by mouth 2 (two) times daily. 30 each 5  . carvedilol (COREG) 3.125 MG tablet Take 1 tablet (3.125 mg total) by mouth 2 (two) times daily with a meal. 60 tablet 6  . cetirizine (ZYRTEC) 10 MG tablet Take 10 mg by mouth daily.    . Cholecalciferol (VITAMIN D-3) 1000 units CAPS Take 2,000 Units by mouth daily.    . diclofenac sodium (VOLTAREN) 1 % GEL Apply 1 application topically 2 (two) times daily as needed (for pain).     . furosemide (LASIX) 40 MG tablet Take 40 mg by mouth daily.    Marland Kitchen glucagon (GLUCAGEN) 1 MG SOLR injection Inject 1 mg into the muscle once as needed for low blood sugar. Reported on 11/16/2015    . guaiFENesin (ROBITUSSIN) 100 MG/5ML SOLN Take 5  mLs by mouth every 4 (four) hours as needed for cough or to loosen phlegm.    . insulin aspart protamine - aspart (NOVOLOG 70/30 MIX) (70-30) 100 UNIT/ML FlexPen Inject 0.05 mLs (5 Units total) into the skin 2 (two) times daily. (Patient taking differently: Inject 5 Units into the skin 2 (two) times daily as needed (CBG >100). ) 15 mL 5  . lisinopril (PRINIVIL,ZESTRIL) 10 MG tablet TAKE 1 TABLET BY MOUTH EVERY DAY (Patient taking differently: Take 10 mg by mouth daily. ) 30 tablet 5  . Multiple Vitamins-Minerals (MULTIVITAMIN WITH MINERALS) tablet Take 1 tablet by mouth daily.    . pantoprazole (PROTONIX) 40 MG tablet Take 1 tablet (40 mg total) by mouth daily with lunch. 90 tablet 1  . pregabalin (LYRICA) 150 MG capsule TAKE ONE CAPSULE BY MOUTH EVERY MORNING AND 2 CAPSULES EVERY NIGHT AT BEDTIME 90 capsule 5  . Propylene Glycol (SYSTANE BALANCE OP) Place 1-2 drops into both eyes daily as needed (dry eyes).     . rosuvastatin (CRESTOR) 20 MG tablet TAKE 1 TABLET(20 MG) BY MOUTH DAILY (Patient taking differently: Take 20 mg by mouth daily. ) 90 tablet 1  . traMADol (ULTRAM) 50 MG tablet Take 1 tablet (50 mg total) by mouth every 4 (four) hours as needed (for  pain). 10 tablet 0  . traZODone (DESYREL) 100 MG tablet Take 100 mg by mouth at bedtime as needed for sleep.     No current facility-administered medications for this visit.      Past Medical History:  Diagnosis Date  . Atherosclerotic PVD with ulceration ([place])    left foot  . Bacteremia   . CAD (coronary artery disease) 2007   Dr Meda Coffee -  non-STEMI, .occluded circumflex.. Taxus stent placed...residual 80% LAD...50% RCA  . Cardiomyopathy, ischemic    EF 45% per ECHO 2008  //   EF 25%, echo, August, 2013 //  Echo (8/15):  Mild LVH, EF 30-35%, ant-lat and lat AK, inf HK, Gr 1 DD, mild MR, mild LAE  . Carotid artery disease ([place])    a.  Doppler, February, 2012, 0-39% bilateral,Mild smooth plaque;  b.  Carotid US (8/15):  Bilateral 1-39%  ICA >>> F/u 2 years  . Constipation   . Dementia ([place])   . Diabetes mellitus    INSULIN-DEPENDent  . Fatigue SEVERE  . H/O pleural effusion 2008   POST THORACENTESIS  . History of colon polyps PRECANCEROUS  . Hyperlipidemia   . Hypertension   . Impotence   . Increased prostate specific antigen (PSA) velocity   . Insomnia   . Lung nodule    resolved 11-2006 CT Chest  . Multiple sclerosis ([place]) [place] -- LAST VISIT 11-20-2010  NOTE W/ CHART  . PAC (premature atrial contraction)    December, 2013  . Pulmonary hypertension ([place])    moderate ECHO Jan 2008  . Scoliosis associated with other condition   . Second degree Mobitz I AV block 11/12/2018  . Sleep apnea   . Symptomatic bradycardia 11/13/2018  . Systolic heart failure   . TIA (transient ischemic attack)   . Tobacco abuse    quit   . Urinary retention    dx ~ 2-12, like from [place], now with a catheter, saw urology  . Urinary tract infection    hx of    ROS:   All systems reviewed and negative except as noted in the HPI.   Past Surgical History:  Procedure Laterality Date  . ABDOMINAL ANGIOGRAM  12/17/2011   Procedure: ABDOMINAL ANGIOGRAM;  Surgeon: Serafina Mitchell, MD;  Location: [facility] CATH LAB;  Service: Cardiovascular;;  . ABDOMINAL AORTAGRAM N/A 08/19/2013   Procedure: ABDOMINAL Maxcine Ham;  Surgeon: Conrad [name], MD;  Location: [facility] CATH LAB;  Service: Cardiovascular;  Laterality: N/A;  . AMPUTATION  03/17/2012   Procedure: AMPUTATION ABOVE KNEE;  Surgeon: Conrad Kopperston, MD;  Location: [place];  Service: Vascular;  Laterality: Left;  . AMPUTATION  06/03/2012   Procedure: AMPUTATION ABOVE KNEE;  Surgeon: Conrad King, MD;  Location: [place];  Service: Vascular;  Laterality: Right;  . BIV PACEMAKER INSERTION CRT-P N/A 11/12/2018   Procedure: BIV PACEMAKER INSERTION CRT-P;  Surgeon: Evans Lance, MD;  Location: [place] CV LAB;  Service: Cardiovascular;  Laterality: N/A;  . CHOLECYSTECTOMY N/A  10/25/2014   Procedure: LAPAROSCOPIC CHOLECYSTECTOMY ;  Surgeon: Coralie Keens, MD;  Location: [place];  Service: General;  Laterality: N/A;  . CORONARY ANGIOPLASTY WITH STENT PLACEMENT  05-01-2006   OCCLUDED CIRCUMFLEX -- TAXUS STENT PLACMENT  AND RESIDUAL 80% LAD,  50% RCA  . CYSTOSCOPY  07/30/2011   Procedure: CYSTOSCOPY;  Surgeon: Hanley Ben, MD;  Location: [facility];  Service: Urology;  Laterality: N/A;  .  ERCP N/A 08/25/2014   Procedure: ENDOSCOPIC RETROGRADE CHOLANGIOPANCREATOGRAPHY (ERCP);  Surgeon: Ladene Artist, MD;  Location[facility] ENDOSCOPY;  Service: Endoscopy;  Laterality: N/A;  . FEMORAL-POPLITEAL BYPASS GRAFT  03/11/2012   Procedure: BYPASS GRAFT FEMORAL-POPLITEAL ARTERY;  Surgeon: Conrad Granite, MD;  Location: [place];  Service: Vascular;  Laterality: Left;  embolectomy left lower leg  . FEMORAL-TIBIAL BYPASS GRAFT  03/11/2012   Procedure: BYPASS GRAFT FEMORAL-TIBIAL ARTERY;  Surgeon: Conrad Warren, MD;  Location: [facility];  Service: Vascular;  Laterality: Left;  Left Femoral -Tibial trunk bypass, Endarterectomy of Tibial- Peroneal trunk with vein angioplasty.  Marland Kitchen HERNIA REPAIR  1990   (R)  . INTRAOPERATIVE ARTERIOGRAM  03/11/2012   Procedure: INTRA OPERATIVE ARTERIOGRAM;  Surgeon: Conrad Brandt, MD;  Location: [place];  Service: Vascular;  Laterality: Left;  . LOWER EXTREMITY ANGIOGRAM Bilateral 12/17/2011   Procedure: LOWER EXTREMITY ANGIOGRAM;  Surgeon: Serafina Mitchell, MD;  Location: [facility] CATH LAB;  Service: Cardiovascular;  Laterality: Bilateral;  bil lower extrem angio  . THORACENTESIS  2008   PLEURAL EFFUSION  . TRANSURETHRAL RESECTION OF PROSTATE  07/30/2011   Procedure: TRANSURETHRAL RESECTION OF THE PROSTATE (TURP);  Surgeon: Hanley Ben, MD;  Location: [facility];  Service: Urology;  Laterality: N/A;     Family History  Problem Relation Age of Onset  . Heart attack Mother 66  . Heart disease Mother   . Stroke Mother   . Hyperlipidemia Mother    . Hypertension Mother   . COPD Father   . Peripheral vascular disease Father   . Diabetes Brother   . Heart disease Brother   . Hypertension Brother   . Heart attack Brother   . Diabetes Daughter   . Colon cancer Neg Hx   . Prostate cancer Neg Hx      Social History   Socioeconomic History  . Marital status: Married    Spouse name: Elber Galyean  . Number of children: 2  . Years of education: Not on file  . Highest education level: Not on file  Occupational History  . Occupation: disable    Employer: RETIRED  Social Needs  . Financial resource strain: Not hard at all  . Food insecurity    Worry: Never true    Inability: Never true  . Transportation needs    Medical: No    Non-medical: No  Tobacco Use  . Smoking status: Former Smoker    Years: 50.00    Quit date: 07/22/2012    Years since quitting: 6.5  . Smokeless tobacco: Current User  . Tobacco comment: pt states that he is using E-cigs  Substance and Sexual Activity  . Alcohol use: No    Alcohol/week: 0.0 standard drinks  . Drug use: No  . Sexual activity: Not Currently    Comment: electronic cigarettes no nicotene  Lifestyle  . Physical activity    Days per week: 0 days    Minutes per session: 0 min  . Stress: Not on file  Relationships  . Social connections    Talks on phone: More than three times a week    Gets together: Three times a week    Attends religious service: 1 to 4 times per year    Active member of club or organization: Yes    Attends meetings of clubs or organizations: 1 to 4 times per year    Relationship status: Married  . Intimate partner violence    Fear of current  or ex partner: Patient refused    Emotionally abused: Patient refused    Physically abused: Patient refused    Forced sexual activity: Patient refused  Other Topics Concern  . Not on file  Social History Narrative   Lives w/ wife     BP (!) 90/56   Pulse 60   Wt 145 lb (65.8 kg)   SpO2 96%   BMI 20.22 kg/m    Physical Exam:  Well appearing NAD HEENT: Unremarkable Neck:  No JVD, no thyromegally Lymphatics:  No adenopathy Back:  No CVA tenderness Lungs:  Clear with no wheezes HEART:  Regular rate rhythm, no murmurs, no rubs, no clicks Abd:  soft, positive bowel sounds, no organomegally, no rebound, no guarding Ext:  2 plus pulses, no edema, no cyanosis, no clubbing Skin:  No rashes no nodules Neuro:  CN II through XII intact, motor grossly intact  EKG - atrial fib with biv pacing  DEVICE  Normal device function.  See PaceArt for details.   Assess/Plan: 1. CHB - he is asymptomatic, s/p PPM insertion. 2. Biv PPM - his St. Jude device has been interogated today and is working normally. 3. PVD - he is s/p bilateral AKA's and is stable. No problems with incision healing.   Mikle Bosworth.D.

## 2019-02-12 NOTE — Patient Instructions (Addendum)
Medication Instructions:  Your physician recommends that you continue on your current medications as directed. Please refer to the Current Medication list given to you today.  Labwork: None ordered.  Testing/Procedures: None ordered.  Follow-Up: Your physician wants you to follow-up in: 9 months with Dr. Lovena Le.   You will receive a reminder letter in the mail two months in advance. If you don't receive a letter, please call our office to schedule the follow-up appointment.  Remote monitoring is used to monitor your Pacemaker from home. This monitoring reduces the number of office visits required to check your device to one time per year. It allows [place] to keep an eye on the functioning of your device to ensure it is working properly. You are scheduled for a device check from home on 05/12/2019. You may send your transmission at any time that day. If you have a wireless device, the transmission will be sent automatically. After your physician reviews your transmission, you will receive a postcard with your next transmission date.  Any Other Special Instructions Will Be Listed Below (If Applicable).  If you need a refill on your cardiac medications before your next appointment, please call your pharmacy.  s

## 2019-02-16 ENCOUNTER — Other Ambulatory Visit: Payer: Self-pay | Admitting: *Deleted

## 2019-02-16 DIAGNOSIS — I10 Essential (primary) hypertension: Secondary | ICD-10-CM | POA: Diagnosis not present

## 2019-02-16 DIAGNOSIS — F0390 Unspecified dementia without behavioral disturbance: Secondary | ICD-10-CM | POA: Diagnosis not present

## 2019-02-16 DIAGNOSIS — E1165 Type 2 diabetes mellitus with hyperglycemia: Secondary | ICD-10-CM | POA: Diagnosis not present

## 2019-02-16 DIAGNOSIS — Z760 Encounter for issue of repeat prescription: Secondary | ICD-10-CM | POA: Diagnosis not present

## 2019-02-16 DIAGNOSIS — I5022 Chronic systolic (congestive) heart failure: Secondary | ICD-10-CM | POA: Diagnosis not present

## 2019-02-16 NOTE — Patient Outreach (Signed)
[facility]) Care Management  02/16/2019  MACHAI DESMITH 06-16-1941 [id number]   Call placed to member's caregiver/wife for follow up telephone assessment, no answer.  HIPAA compliant voice message left, will follow up within the next 3-4 business days.  [name], [place], MSN [place] [phone]

## 2019-02-22 ENCOUNTER — Other Ambulatory Visit: Payer: Self-pay | Admitting: *Deleted

## 2019-02-22 NOTE — Patient Outreach (Signed)
[facility]) Care Management  02/22/2019  Jaime [name] [birth year] [id number]   Unsuccessful outreach #2.  Voice message received from member's wife after last unsuccessful outreach.  Call placed to wife for follow up telephone assessment, no answer.  HIPAA compliant voice message left, unsuccessful outreach letter sent.  Will follow up within the next 3-4 business day if no call back.  [name], [place], MSN [place] [phone]

## 2019-02-22 NOTE — Progress Notes (Signed)
Remote pacemaker transmission.   

## 2019-02-24 ENCOUNTER — Other Ambulatory Visit: Payer: Self-pay | Admitting: *Deleted

## 2019-02-24 NOTE — Patient Outreach (Addendum)
[facility] ([facility]) Care Management  02/24/2019  Jaime [name] 02/18/1941 [id number]   Voice message received back from member's wife, [name], after unsuccessful outreach on 8/3.  Call placed back to wife, no answer.  HIPAA compliant voice message left, will make another outreach attempt on 8/7 as scheduled.    Updated:  Call received back from member's wife [name].  She report member is doing much better.  Although he has increased strength, she state she is still doing most of the work with transitioning him.  PT/OT sessions are not complete.  Confirmed per wife and per chart that he is active with [facility] for home based palliative care program, was last see in July.  She is still concerned with being able to adequately care for him in the near future.  Report member has voiced his desire not to be placed in a regular SNF but rather [facility].  She has been in contact with [facility] social worker and has contact information and paperwork to begin the process.  State she will look for the documents and contact the [facility] representative as soon as possible, not to wait until she is no longer able to care for him and transition is emergent.  Denies any urgent concerns at this time, advised to contact this care manager with questions.  [facility] CM Care Plan Problem One     Most Recent Value  Care Plan Problem One  Risk for readmission as evidenced by recent hospitalization  Role Documenting the Problem One  Care Management Coordinator  Care Plan for Problem One  Active  [facility] CM Short Term Goal #1   Member will report contact with [facility] rep within the next 4 weeks  [facility] CM Short Term Goal #1 Start Date  02/24/19  Interventions for Short Term Goal #1  Advised wife of importance of being proactive with long term plan and getting member established with [facility] for long term care  [facility] CM Short Term Goal #2   Member's wife will report contact with palliative care team within the  next week  [facility] CM Short Term Goal #2 Start Date  12/22/18  [facility] CM Short Term Goal #2 Met Date  02/24/19       [place], RN, MSN [facility] Care Management  Community Care Manager [phone]  

## 2019-02-26 ENCOUNTER — Ambulatory Visit: Payer: Self-pay | Admitting: *Deleted

## 2019-03-02 NOTE — Progress Notes (Signed)
Unable to connect with patient.    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 He is accompanied by his wife who provides some history.   UPDATE: For pain, he takes Lyrica 150mg  in AM and 300mg  at night. He also takes baclofen 5mg  twice daily. He takes tramadol 50mg  2 to 3 times daily.    Due to symptomatic bradycardia, he had a pacemaker implanted on 11/12/18.  He was admitted to the hospital on 11/16/18 for acute hypoxic respiratory failure due to healthcare-associated pneumonia.  He also was encephalopathic due to the pneumonia as well as Klebsiella UTI.  Due to the pacemaker, he was unable to get an MRI.  CT head from 4/27 and repeat CT head from 4/28 both revealed no acute intracranial process.  HISTORY: He was diagnosed with MS in 1993. Initially, he developed right arm and leg weakness along with balance problems. Symptoms initially improved but had progressed over the years. He was on multiple immunomodulating agents such as Betaseron, Copaxone, Cytoxin and monthly Solumedrol. He currently is not on an agent and hasn't had a flare up in 4 years. He has bilateral ATK ambutations performed in 2013 as a consequence of his peripheral vascular and diabetes. He requires assistance with bathing and dressing, but he is able to feed himself. He uses a power wheelchair. He had been on amantadine for many years.  MRI Brain w/wo from 11/29/04 demonstrated "extensive white matter disease in the supratentorial compartment, which is consistent with advanced multiple sclerosis. There is confluence of the white matter lesions with atrophy and hydrocephalus, thinned corpus callosum, and black holes, however somewhat unusual for MS of this degree, no infrantentorial lesions are seen and no active  lesions are identified by means of contrast." CT of head was performed on 03/23/12 for encephalopathy, which revealed atrophy and nonspecific small vessel ischemic changes.  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 He has some poor short-term memory. Sometimes he calls his wife by his mother's name. He cannot always remember the names of his great grandchildren. He has more difficulty figuring out how to maneuver the motorized wheelchair.  Prior medication: Gabapentin was ineffective. Amantadine was discontinued due to nightmares.  He has [place]. He is unable to sit due to pain and often is laying in bed. He is frequently turned to avoid pressure ulcers.

## 2019-03-03 ENCOUNTER — Other Ambulatory Visit: Payer: Self-pay

## 2019-03-03 ENCOUNTER — Encounter: Payer: Self-pay | Admitting: Neurology

## 2019-03-03 ENCOUNTER — Telehealth (INDEPENDENT_AMBULATORY_CARE_PROVIDER_SITE_OTHER): Payer: Medicare Other | Admitting: Neurology

## 2019-03-12 ENCOUNTER — Other Ambulatory Visit: Payer: Self-pay

## 2019-03-12 ENCOUNTER — Telehealth: Payer: Self-pay | Admitting: Neurology

## 2019-03-12 NOTE — Telephone Encounter (Signed)
Since I have not seen him in over a year, I cannot refill tramadol.  He should make a follow up appointment with me (virtual).  In meantime, I would ask that he check with his PCP for refill.

## 2019-03-12 NOTE — Telephone Encounter (Signed)
Left VM about Refill on the Tramadol medication- new prescription 50mg - [facility] on file is correct.Thanks!

## 2019-03-15 ENCOUNTER — Other Ambulatory Visit: Payer: Self-pay

## 2019-03-15 NOTE — Telephone Encounter (Signed)
Mr. Jaime [name] must make a follow up appointment before I can refill tramadol as it is a controlled substance and I have not seen him in over a year.  Virtual or telephone is fine.

## 2019-03-15 NOTE — Telephone Encounter (Signed)
Wife is calling in about needing a prescription for the tramadol 50 mg. [facility] on file correct. Thanks!

## 2019-03-15 NOTE — Telephone Encounter (Signed)
advised

## 2019-03-16 DIAGNOSIS — G894 Chronic pain syndrome: Secondary | ICD-10-CM | POA: Diagnosis not present

## 2019-03-16 DIAGNOSIS — I509 Heart failure, unspecified: Secondary | ICD-10-CM | POA: Diagnosis not present

## 2019-03-16 DIAGNOSIS — Z7902 Long term (current) use of antithrombotics/antiplatelets: Secondary | ICD-10-CM | POA: Diagnosis not present

## 2019-03-16 DIAGNOSIS — E1165 Type 2 diabetes mellitus with hyperglycemia: Secondary | ICD-10-CM | POA: Diagnosis not present

## 2019-03-16 DIAGNOSIS — I5022 Chronic systolic (congestive) heart failure: Secondary | ICD-10-CM | POA: Diagnosis not present

## 2019-03-17 ENCOUNTER — Other Ambulatory Visit: Payer: Self-pay | Admitting: *Deleted

## 2019-03-17 NOTE — Patient Outreach (Signed)
[facility]) Care Management  03/17/2019  KYZIR MATSUYAMA 1940/08/10 [id number]   Call placed to member's wife, she report member is doing "better."  State over the weekend he developed a "rattle" in his chest.  She notified the MD office and gave him cough medicine, today improved.  Had telephone visit with [name] this week, no concerns noted per wife.  Report she is still checking member's blood pressure and blood sugar daily.  Today readings were 113/67 and 161 post breakfast.  This care manager inquired about completing paperwork to have member placed with [facility] when ready, again state she is unable to find in the home.  State she will call VA rep to have new paperwork sent.  Verbalizes understanding of urgency to have this done as caring for him independently is wearing on her body.  She will need to have plan in place in the event something happens to her and she is unable to care for him.  Advised to contact this care manager with any issues/questions.  Denies any urgent concerns at this time.  Will follow up within the next month. If member remains stable, will transition to health coach as he will continue to remain active with home based palliative care through [place].  [facility] CM Care Plan Problem One     Most Recent Value  Care Plan Problem One  Risk for readmission as evidenced by recent hospitalization  Role Documenting the Problem One  Care Management [place] for Problem One  Active  [facility] CM Short Term Goal #1   Member will report contact with [facility] rep within the next 4 weeks  [facility] CM Short Term Goal #1 Start Date  02/24/19  Interventions for Short Term Goal #1  Advised wife to contact VA rep to request new paperwork for placement     [name], RN, MSN [place] Manager [phone]

## 2019-03-24 ENCOUNTER — Telehealth: Payer: Self-pay | Admitting: Internal Medicine

## 2019-03-24 NOTE — Telephone Encounter (Signed)
Spoke with the pts wife and endorsed to her that per Dr Meda Coffee, she would like for the pt to hold his carvedilol and encourage po fluid intake, and get him in to see his PCP [name] tomorrow, to test for UTI.  Informed the pts wife that Dr. Meda Coffee recommends that he hold his carvedilol until his UTI resolves.  Wife verbalized understanding and agrees with this plan.

## 2019-03-24 NOTE — Telephone Encounter (Signed)
I would hold carvedilol and encourage fluid intake until UTI resolves

## 2019-03-24 NOTE — Telephone Encounter (Signed)
Called the pts wife and she is calling in to report to Dr Meda Coffee that the she held his carvedilol this morning for his BP was 91/43 HR-62.  Wife states she then pushed PO fluids on the pt and rechecked his BP and it went back up to 108/70 HR-66.  Wife states she thinks he may have a UTI coming on, for his urine is foul in smell and cloudy in appearance.  Wife also states that he has been having a lot of phantom limb pain, for he is bilateral amputee.  Wife would like for Dr Meda Coffee to advise on what they should do going forward with his carvedilol regimen.  Should it be reduced, and held tonight? Wife states she is calling his PCP [name] tomorrow morning to get him an appt to address his possible UTI.  Wife reports he is currently resting comfortably at this time, with no active cardiac complaints.  Informed the pts wife that I will route this message to Dr. Meda Coffee to review and advise on, and follow-up with her shortly thereafter.  Advised the pts wife that if recommendations are not received here shortly, she should check his BP this evening before dinner time and time of administration of his coreg, and if this is low again, then she should hold his dose of coreg tonight, and monitor in the morning.  Advised the pts wife that she should continue to make him hydrate well, for this will help with his BP and his possible UTI.  Wife verbalized understanding and agrees with this plan.

## 2019-03-24 NOTE — Telephone Encounter (Signed)
Pt c/o BP issue: STAT if pt c/o blurred vision, one-sided weakness or slurred speech  1. What are your last 5 BP readings? 91/43 hr 62      108/70 HR 66  2. Are you having any other symptoms (ex. Dizziness, headache, blurred vision, passed out)? She states early in the day he was having leg pain.   3. What is your BP issue? Patient's wife called to advised of his low BP, she has withheld his carvedilol (COREG) 3.125 MG tablet, and that helped his BP. She wants to know what to do.

## 2019-03-31 ENCOUNTER — Emergency Department (HOSPITAL_COMMUNITY): Payer: Medicare Other

## 2019-03-31 ENCOUNTER — Encounter (HOSPITAL_COMMUNITY): Payer: Self-pay | Admitting: Emergency Medicine

## 2019-03-31 ENCOUNTER — Telehealth: Payer: Self-pay

## 2019-03-31 ENCOUNTER — Inpatient Hospital Stay (HOSPITAL_COMMUNITY)
Admission: EM | Admit: 2019-03-31 | Discharge: 2019-04-02 | DRG: 193 | Disposition: A | Discharge status: Home / Self Care | Payer: Medicare Other | Attending: Internal Medicine | Admitting: Internal Medicine

## 2019-03-31 ENCOUNTER — Telehealth: Payer: Self-pay | Admitting: Cardiology

## 2019-03-31 ENCOUNTER — Other Ambulatory Visit: Payer: Self-pay

## 2019-03-31 DIAGNOSIS — J181 Lobar pneumonia, unspecified organism: Secondary | ICD-10-CM | POA: Diagnosis present

## 2019-03-31 DIAGNOSIS — Z91030 Bee allergy status: Secondary | ICD-10-CM

## 2019-03-31 DIAGNOSIS — Z8673 Personal history of transient ischemic attack (TIA), and cerebral infarction without residual deficits: Secondary | ICD-10-CM

## 2019-03-31 DIAGNOSIS — I441 Atrioventricular block, second degree: Secondary | ICD-10-CM | POA: Diagnosis present

## 2019-03-31 DIAGNOSIS — Z794 Long term (current) use of insulin: Secondary | ICD-10-CM

## 2019-03-31 DIAGNOSIS — Z8719 Personal history of other diseases of the digestive system: Secondary | ICD-10-CM

## 2019-03-31 DIAGNOSIS — I10 Essential (primary) hypertension: Secondary | ICD-10-CM | POA: Diagnosis not present

## 2019-03-31 DIAGNOSIS — Z89611 Acquired absence of right leg above knee: Secondary | ICD-10-CM | POA: Diagnosis not present

## 2019-03-31 DIAGNOSIS — J189 Pneumonia, unspecified organism: Secondary | ICD-10-CM | POA: Diagnosis present

## 2019-03-31 DIAGNOSIS — Z8349 Family history of other endocrine, nutritional and metabolic diseases: Secondary | ICD-10-CM

## 2019-03-31 DIAGNOSIS — M419 Scoliosis, unspecified: Secondary | ICD-10-CM | POA: Diagnosis present

## 2019-03-31 DIAGNOSIS — N390 Urinary tract infection, site not specified: Secondary | ICD-10-CM | POA: Diagnosis present

## 2019-03-31 DIAGNOSIS — I2510 Atherosclerotic heart disease of native coronary artery without angina pectoris: Secondary | ICD-10-CM | POA: Diagnosis present

## 2019-03-31 DIAGNOSIS — Z950 Presence of cardiac pacemaker: Secondary | ICD-10-CM

## 2019-03-31 DIAGNOSIS — R410 Disorientation, unspecified: Secondary | ICD-10-CM | POA: Diagnosis not present

## 2019-03-31 DIAGNOSIS — B961 Klebsiella pneumoniae [K. pneumoniae] as the cause of diseases classified elsewhere: Secondary | ICD-10-CM | POA: Diagnosis present

## 2019-03-31 DIAGNOSIS — Z8249 Family history of ischemic heart disease and other diseases of the circulatory system: Secondary | ICD-10-CM

## 2019-03-31 DIAGNOSIS — F0390 Unspecified dementia without behavioral disturbance: Secondary | ICD-10-CM | POA: Diagnosis present

## 2019-03-31 DIAGNOSIS — Z823 Family history of stroke: Secondary | ICD-10-CM

## 2019-03-31 DIAGNOSIS — Z7401 Bed confinement status: Secondary | ICD-10-CM

## 2019-03-31 DIAGNOSIS — E785 Hyperlipidemia, unspecified: Secondary | ICD-10-CM | POA: Diagnosis present

## 2019-03-31 DIAGNOSIS — Z515 Encounter for palliative care: Secondary | ICD-10-CM | POA: Diagnosis present

## 2019-03-31 DIAGNOSIS — I110 Hypertensive heart disease with heart failure: Secondary | ICD-10-CM | POA: Diagnosis present

## 2019-03-31 DIAGNOSIS — I252 Old myocardial infarction: Secondary | ICD-10-CM | POA: Diagnosis not present

## 2019-03-31 DIAGNOSIS — Z79899 Other long term (current) drug therapy: Secondary | ICD-10-CM

## 2019-03-31 DIAGNOSIS — Z993 Dependence on wheelchair: Secondary | ICD-10-CM

## 2019-03-31 DIAGNOSIS — J159 Unspecified bacterial pneumonia: Secondary | ICD-10-CM | POA: Diagnosis present

## 2019-03-31 DIAGNOSIS — I495 Sick sinus syndrome: Secondary | ICD-10-CM | POA: Diagnosis present

## 2019-03-31 DIAGNOSIS — Z87440 Personal history of urinary (tract) infections: Secondary | ICD-10-CM

## 2019-03-31 DIAGNOSIS — I255 Ischemic cardiomyopathy: Secondary | ICD-10-CM | POA: Diagnosis present

## 2019-03-31 DIAGNOSIS — F17290 Nicotine dependence, other tobacco product, uncomplicated: Secondary | ICD-10-CM | POA: Diagnosis present

## 2019-03-31 DIAGNOSIS — R509 Fever, unspecified: Secondary | ICD-10-CM | POA: Diagnosis not present

## 2019-03-31 DIAGNOSIS — Z7982 Long term (current) use of aspirin: Secondary | ICD-10-CM

## 2019-03-31 DIAGNOSIS — J9691 Respiratory failure, unspecified with hypoxia: Secondary | ICD-10-CM | POA: Diagnosis present

## 2019-03-31 DIAGNOSIS — Z888 Allergy status to other drugs, medicaments and biological substances status: Secondary | ICD-10-CM

## 2019-03-31 DIAGNOSIS — R0902 Hypoxemia: Secondary | ICD-10-CM | POA: Diagnosis not present

## 2019-03-31 DIAGNOSIS — I5022 Chronic systolic (congestive) heart failure: Secondary | ICD-10-CM | POA: Diagnosis present

## 2019-03-31 DIAGNOSIS — G546 Phantom limb syndrome with pain: Secondary | ICD-10-CM | POA: Diagnosis present

## 2019-03-31 DIAGNOSIS — Z89612 Acquired absence of left leg above knee: Secondary | ICD-10-CM | POA: Diagnosis not present

## 2019-03-31 DIAGNOSIS — Z833 Family history of diabetes mellitus: Secondary | ICD-10-CM

## 2019-03-31 DIAGNOSIS — E1159 Type 2 diabetes mellitus with other circulatory complications: Secondary | ICD-10-CM

## 2019-03-31 DIAGNOSIS — Z20828 Contact with and (suspected) exposure to other viral communicable diseases: Secondary | ICD-10-CM | POA: Diagnosis present

## 2019-03-31 DIAGNOSIS — R140 Abdominal distension (gaseous): Secondary | ICD-10-CM | POA: Diagnosis not present

## 2019-03-31 DIAGNOSIS — G8220 Paraplegia, unspecified: Secondary | ICD-10-CM | POA: Diagnosis present

## 2019-03-31 DIAGNOSIS — G35 Multiple sclerosis: Secondary | ICD-10-CM | POA: Diagnosis present

## 2019-03-31 DIAGNOSIS — Z89519 Acquired absence of unspecified leg below knee: Secondary | ICD-10-CM

## 2019-03-31 DIAGNOSIS — E119 Type 2 diabetes mellitus without complications: Secondary | ICD-10-CM | POA: Diagnosis present

## 2019-03-31 DIAGNOSIS — Z825 Family history of asthma and other chronic lower respiratory diseases: Secondary | ICD-10-CM

## 2019-03-31 DIAGNOSIS — R404 Transient alteration of awareness: Secondary | ICD-10-CM | POA: Diagnosis not present

## 2019-03-31 LAB — URINALYSIS, ROUTINE W REFLEX MICROSCOPIC
Bilirubin Urine: NEGATIVE
Glucose, UA: NEGATIVE mg/dL
Ketones, ur: NEGATIVE mg/dL
Nitrite: NEGATIVE
Protein, ur: NEGATIVE mg/dL
Specific Gravity, Urine: 1.01 (ref 1.005–1.030)
WBC, UA: >50 WBC/hpf — ABNORMAL HIGH (ref 0–5)
pH: 5 (ref 5.0–8.0)

## 2019-03-31 LAB — COMPREHENSIVE METABOLIC PANEL
ALT: 23 U/L (ref 0–44)
AST: 31 U/L (ref 15–41)
Albumin: 3.5 g/dL (ref 3.5–5.0)
Alkaline Phosphatase: 53 U/L (ref 38–126)
Anion gap: 8 (ref 5–15)
BUN: 23 mg/dL (ref 8–23)
CO2: 24 mmol/L (ref 22–32)
Calcium: 8.8 mg/dL — ABNORMAL LOW (ref 8.9–10.3)
Chloride: 105 mmol/L (ref 98–111)
Creatinine, Ser: 0.79 mg/dL (ref 0.61–1.24)
GFR calc Af Amer: >60 mL/min (ref >60)
GFR calc non Af Amer: >60 mL/min (ref >60)
Glucose, Bld: 172 mg/dL — ABNORMAL HIGH (ref 70–99)
Potassium: 4.7 mmol/L (ref 3.5–5.1)
Sodium: 137 mmol/L (ref 135–145)
Total Bilirubin: 0.9 mg/dL (ref 0.3–1.2)
Total Protein: 7.7 g/dL (ref 6.5–8.1)

## 2019-03-31 LAB — CBC WITH DIFFERENTIAL/PLATELET
Abs Immature Granulocytes: 0.02 10*3/uL (ref 0.00–0.07)
Basophils Absolute: 0 10*3/uL (ref 0.0–0.1)
Basophils Relative: 0 %
Eosinophils Absolute: 0.1 10*3/uL (ref 0.0–0.5)
Eosinophils Relative: 1 %
HCT: 42.9 % (ref 39.0–52.0)
Hemoglobin: 13.3 g/dL (ref 13.0–17.0)
Immature Granulocytes: 0 %
Lymphocytes Relative: 10 %
Lymphs Abs: 0.9 10*3/uL (ref 0.7–4.0)
MCH: 25.7 pg — ABNORMAL LOW (ref 26.0–34.0)
MCHC: 31 g/dL (ref 30.0–36.0)
MCV: 82.8 fL (ref 80.0–100.0)
Monocytes Absolute: 0.4 10*3/uL (ref 0.1–1.0)
Monocytes Relative: 4 %
Neutro Abs: 7.9 10*3/uL — ABNORMAL HIGH (ref 1.7–7.7)
Neutrophils Relative %: 85 %
Platelets: 150 10*3/uL (ref 150–400)
RBC: 5.18 MIL/uL (ref 4.22–5.81)
RDW: 18.7 % — ABNORMAL HIGH (ref 11.5–15.5)
WBC: 9.3 10*3/uL (ref 4.0–10.5)
nRBC: 0 % (ref 0.0–0.2)

## 2019-03-31 LAB — LIPASE, BLOOD: Lipase: 31 U/L (ref 11–51)

## 2019-03-31 LAB — LACTIC ACID, PLASMA: Lactic Acid, Venous: 1.9 mmol/L (ref 0.5–1.9)

## 2019-03-31 LAB — SARS CORONAVIRUS 2 BY RT PCR (HOSPITAL ORDER, PERFORMED IN ~~LOC~~ HOSPITAL LAB): SARS Coronavirus 2: NEGATIVE

## 2019-03-31 MED ORDER — HYDROMORPHONE HCL 1 MG/ML IJ SOLN
0.5000 mg | INTRAMUSCULAR | Status: DC | PRN
  Administered 2019-03-31: 0.5 mg via INTRAVENOUS
  Filled 2019-03-31: qty 1

## 2019-03-31 MED ORDER — VANCOMYCIN HCL 10 G IV SOLR
1250.0000 mg | Freq: Once | INTRAVENOUS | Status: AC
  Administered 2019-03-31: 14:00:00 1250 mg via INTRAVENOUS
  Filled 2019-03-31: qty 1250

## 2019-03-31 MED ORDER — VANCOMYCIN HCL 500 MG IV SOLR
500.0000 mg | Freq: Two times a day (BID) | INTRAVENOUS | Status: DC
  Administered 2019-04-01: 500 mg via INTRAVENOUS
  Filled 2019-03-31 (×2): qty 500

## 2019-03-31 MED ORDER — SODIUM CHLORIDE 0.9 % IV SOLN
2.0000 g | Freq: Once | INTRAVENOUS | Status: AC
  Administered 2019-03-31: 2 g via INTRAVENOUS
  Filled 2019-03-31: qty 2

## 2019-03-31 MED ORDER — ACETAMINOPHEN 650 MG RE SUPP
650.0000 mg | Freq: Once | RECTAL | Status: AC
  Administered 2019-03-31: 650 mg via RECTAL
  Filled 2019-03-31: qty 1

## 2019-03-31 NOTE — Telephone Encounter (Signed)
Copied from [place] [phone]. Topic: General - Other >> Mar 31, 2019  9:50 AM Keene Breath wrote: Reason for CRM: Patient's wife called to inform the doctor that EMS took patient to the hospital this morning, problems breathing, BP high and patient was swollen.  Wife stated that she will keep the doctor informed and if he has any questions, he can call at [phone]

## 2019-03-31 NOTE — ED Notes (Signed)
Admitting MD paged regarding pt BP trending down. Pt still alert/oriented at this time.

## 2019-03-31 NOTE — ED Notes (Signed)
Phlebotomy made aware unsuccessful attempt made to obtain Istat. Phleb to come shortly to attempt.

## 2019-03-31 NOTE — ED Triage Notes (Signed)
Pt BIB [facility] from home. Per EMS called out for altered mental status. Pt A&Ox3. Pt presenting with abdominal distention. Pt has history of UTI.

## 2019-03-31 NOTE — Progress Notes (Signed)
Pharmacy Antibiotic Note  Jaime [name] is a 78 y.o. male admitted on 03/31/2019 with pneumonia. Noted hx of paraplegia, s/p bilateral AKA, and wheelchair-bound. Pharmacy has been consulted for vancomycin dosing. Also has cefepime ordered x 1 dose per EDP. Tm 102.3, wbc wnl, LA 1.9. SCr 0.79 on admit (baseline ~0.5-0.6).  Plan: Vancomycin 1250mg  IV x 1; then 500mg  IV q12h Cefepime 2g IV x 1 - f/u if to continue Monitor clinical progress, c/s, renal function F/u de-escalation plan/LOT, vancomycin levels as indicated     Temp (24hrs), Avg:102.3 F (39.1 C), Min:102.3 F (39.1 C), Max:102.3 F (39.1 C)  Recent Labs  Lab 03/31/19 1050  WBC 9.3  LATICACIDVEN 1.9    CrCl cannot be calculated (Patient's most recent lab result is older than the maximum 21 days allowed.).    Allergies  Allergen Reactions  . Bee Venom Anaphylaxis  . Influenza Vaccines Other (See Comments)    Made patient "sick for months"  . Atorvastatin Other (See Comments)    Severe pain in stumps  . Latex Rash    Elicia Lamp, PharmD, BCPS Please check AMION for all [facility] contact numbers Clinical Pharmacist 03/31/2019 11:34 AM

## 2019-03-31 NOTE — ED Provider Notes (Signed)
Medical screening examination/treatment/procedure(s) were conducted as a shared visit with non-physician practitioner(s) and myself.  I personally evaluated the patient during the encounter.  EKG Interpretation  Date/Time:  Wednesday March 31 2019 09:21:37 EDT Ventricular Rate:  80 PR Interval:    QRS Duration: 125 QT Interval:  382 QTC Calculation: 441 R Axis:   -78 Text Interpretation:  paced, no sig chnage from previous Confirmed by Charlesetta Shanks [phone]) on 03/31/2019 9:51:20 AM  Patient has complex medical history including insulin-dependent diabetes, bilateral AKA wheelchair-bound.  Patient brought in for altered mental status and somnolence.  Oral temperature 102.3.  Patient reports he has lower back pain.  He reports he actually hurts all the way from his low back up to the top of his back.  He also suffers from phantom leg pains.  Patient is alert.  He is answering questions.  He does not have respiratory distress but does have rhonchorous sounding cough intermittently.  Heart regular no gross rub murmur gallop.  Lungs with rhonchi but adequate airflow.  Patient endorses discomfort to palpation throughout the abdomen.  He is wearing a condom catheter with some urine output.  Bilateral AKA that are well-healed.  I agree with plan of management.   Charlesetta Shanks, MD 03/31/19 1123

## 2019-03-31 NOTE — ED Notes (Signed)
ED TO INPATIENT HANDOFF REPORT  ED Nurse Name and Phone #: Daralene Milch, RN  [phone]  S Name/Age/Gender Jaime [name] 78 y.o. male Room/Bed: 054C/054C  Code Status   Code Status: Prior  Home/SNF/Other Home Patient oriented to: self, place, time and situation Is this baseline? Yes   Triage Complete: Triage complete  Chief Complaint AMS   Triage Note Pt BIB [facility] from home. Per EMS called out for altered mental status. Pt A&Ox3. Pt presenting with abdominal distention. Pt has history of UTI.    Allergies Allergies  Allergen Reactions  . Bee Venom Anaphylaxis  . Influenza Vaccines Other (See Comments)    Made patient "sick for months"  . Atorvastatin Other (See Comments)    Severe pain in stumps  . Latex Rash    Level of Care/Admitting Diagnosis ED Disposition    ED Disposition Condition [facility]: [place] [[id number]]  Level of Care: Telemetry Medical [104]  Covid Evaluation: Confirmed COVID Negative  Diagnosis: Community acquired pneumonia [id number]  Admitting Physician: Laren Everts, [place]  Attending Physician: HIJAZI, ALI Marshal.[place]  Estimated length of stay: past midnight tomorrow  Certification:: I certify this patient will need inpatient services for at least 2 midnights  PT Class (Do Not Modify): Inpatient [101]  PT Acc Code (Do Not Modify): Private [1]       B Medical/Surgery History Past Medical History:  Diagnosis Date  . Atherosclerotic PVD with ulceration ([place])    left foot  . Bacteremia   . CAD (coronary artery disease) 2007   Dr Meda Coffee -  non-STEMI, .occluded circumflex.. Taxus stent placed...residual 80% LAD...50% RCA  . Cardiomyopathy, ischemic    EF 45% per ECHO 2008  //   EF 25%, echo, August, 2013 //  Echo (8/15):  Mild LVH, EF 30-35%, ant-lat and lat AK, inf HK, Gr 1 DD, mild MR, mild LAE  . Carotid artery disease ([place])    a.  Doppler, February, 2012, 0-39% bilateral,Mild smooth plaque;  b.  Carotid  US (8/15):  Bilateral 1-39% ICA >>> F/u 2 years  . Constipation   . Dementia ([place])   . Diabetes mellitus    INSULIN-DEPENDent  . Fatigue SEVERE  . H/O pleural effusion 2008   POST THORACENTESIS  . History of colon polyps PRECANCEROUS  . Hyperlipidemia   . Hypertension   . Impotence   . Increased prostate specific antigen (PSA) velocity   . Insomnia   . Lung nodule    resolved 11-2006 CT Chest  . Multiple sclerosis ([place]) [place] -- LAST VISIT 11-20-2010  NOTE W/ CHART  . PAC (premature atrial contraction)    December, 2013  . Pulmonary hypertension ([place])    moderate ECHO Jan 2008  . Scoliosis associated with other condition   . Second degree Mobitz I AV block 11/12/2018  . Sleep apnea   . Symptomatic bradycardia 11/13/2018  . Systolic heart failure   . TIA (transient ischemic attack)   . Tobacco abuse    quit   . Urinary retention    dx ~ 2-12, like from [place], now with a catheter, saw urology  . Urinary tract infection    hx of   Past Surgical History:  Procedure Laterality Date  . ABDOMINAL ANGIOGRAM  12/17/2011   Procedure: ABDOMINAL ANGIOGRAM;  Surgeon: Serafina Mitchell, MD;  Location: [facility] CATH LAB;  Service: Cardiovascular;;  . ABDOMINAL AORTAGRAM N/A 08/19/2013  Procedure: ABDOMINAL AORTAGRAM;  Surgeon: Conrad Beulah Beach, MD;  Location: [facility] CATH LAB;  Service: Cardiovascular;  Laterality: N/A;  . AMPUTATION  03/17/2012   Procedure: AMPUTATION ABOVE KNEE;  Surgeon: Conrad Diamond, MD;  Location: [facility];  Service: Vascular;  Laterality: Left;  . AMPUTATION  06/03/2012   Procedure: AMPUTATION ABOVE KNEE;  Surgeon: Conrad [employer], MD;  Location: [place];  Service: Vascular;  Laterality: Right;  . BIV PACEMAKER INSERTION CRT-P N/A 11/12/2018   Procedure: BIV PACEMAKER INSERTION CRT-P;  Surgeon: Evans Lance, MD;  Location: [facility] CV LAB;  Service: Cardiovascular;  Laterality: N/A;  . CHOLECYSTECTOMY N/A 10/25/2014   Procedure: LAPAROSCOPIC CHOLECYSTECTOMY ;   Surgeon: Coralie Keens, MD;  Location: [facility];  Service: General;  Laterality: N/A;  . CORONARY ANGIOPLASTY WITH STENT PLACEMENT  05-01-2006   OCCLUDED CIRCUMFLEX -- TAXUS STENT PLACMENT  AND RESIDUAL 80% LAD,  50% RCA  . CYSTOSCOPY  07/30/2011   Procedure: CYSTOSCOPY;  Surgeon: Hanley Ben, MD;  Location[facility];  Service: Urology;  Laterality: N/A;  . ERCP N/A 08/25/2014   Procedure: ENDOSCOPIC RETROGRADE CHOLANGIOPANCREATOGRAPHY (ERCP);  Surgeon: Ladene Artist, MD;  Location: [facility] ENDOSCOPY;  Service: Endoscopy;  Laterality: N/A;  . FEMORAL-POPLITEAL BYPASS GRAFT  03/11/2012   Procedure: BYPASS GRAFT FEMORAL-POPLITEAL ARTERY;  Surgeon: Conrad [place], MD;  Location: [facility];  Service: Vascular;  Laterality: Left;  embolectomy left lower leg  . FEMORAL-TIBIAL BYPASS GRAFT  03/11/2012   Procedure: BYPASS GRAFT FEMORAL-TIBIAL ARTERY;  Surgeon: Conrad [place], MD;  Location: [facility] OR;  Service: Vascular;  Laterality: Left;  Left Femoral -Tibial trunk bypass, Endarterectomy of Tibial- Peroneal trunk with vein angioplasty.  Marland Kitchen HERNIA REPAIR  1990   (R)  . INTRAOPERATIVE ARTERIOGRAM  03/11/2012   Procedure: INTRA OPERATIVE ARTERIOGRAM;  Surgeon: Conrad [place], MD;  Location: [place];  Service: Vascular;  Laterality: Left;  . LOWER EXTREMITY ANGIOGRAM Bilateral 12/17/2011   Procedure: LOWER EXTREMITY ANGIOGRAM;  Surgeon: Serafina Mitchell, MD;  Location: [facility] CATH LAB;  Service: Cardiovascular;  Laterality: Bilateral;  bil lower extrem angio  . THORACENTESIS  2008   PLEURAL EFFUSION  . TRANSURETHRAL RESECTION OF PROSTATE  07/30/2011   Procedure: TRANSURETHRAL RESECTION OF THE PROSTATE (TURP);  Surgeon: Hanley Ben, MD;  Location: [facility];  Service: Urology;  Laterality: N/A;     A IV Location/Drains/Wounds Patient Lines/Drains/Airways Status   Active Line/Drains/Airways    Name:   Placement date:   Placement time:   Site:   Days:   Peripheral IV 03/31/19 Right;Anterior  Forearm   03/31/19    1246    Forearm   less than 1   External Urinary Catheter   11/18/18    -    -   133   Incision (Closed) 11/12/18 Chest Lateral;Left;Upper   11/12/18    -     139          Intake/Output Last 24 hours  Intake/Output Summary (Last 24 hours) at 03/31/2019 2341 Last data filed at 03/31/2019 1342 Gross per 24 hour  Intake 100 ml  Output -  Net 100 ml    Labs/Imaging Results for orders placed or performed during the hospital encounter of 03/31/19 (from the past 48 hour(s))  SARS Coronavirus 2 [facility] order, Performed in [facility] hospital lab) Nasopharyngeal Nasopharyngeal Swab     Status: None   Collection Time: 03/31/19  9:57 AM   Specimen: Nasopharyngeal Swab  Result Value Ref Range   SARS Coronavirus 2 NEGATIVE NEGATIVE    Comment: (NOTE) If result is NEGATIVE SARS-CoV-2 target nucleic acids are NOT DETECTED. The SARS-CoV-2 RNA is generally detectable in upper and lower  respiratory specimens during the acute phase of infection. The lowest  concentration of SARS-CoV-2 viral copies this assay can detect is 250  copies / mL. A negative result does not preclude SARS-CoV-2 infection  and should not be used as the sole basis for treatment or other  patient management decisions.  A negative result may occur with  improper specimen collection / handling, submission of specimen other  than nasopharyngeal swab, presence of viral mutation(s) within the  areas targeted by this assay, and inadequate number of viral copies  (<250 copies / mL). A negative result must be combined with clinical  observations, patient history, and epidemiological information. If result is POSITIVE SARS-CoV-2 target nucleic acids are DETECTED. The SARS-CoV-2 RNA is generally detectable in upper and lower  respiratory specimens dur ing the acute phase of infection.  Positive  results are indicative of active infection with SARS-CoV-2.  Clinical  correlation with patient history and other  diagnostic information is  necessary to determine patient infection status.  Positive results do  not rule out bacterial infection or co-infection with other viruses. If result is PRESUMPTIVE POSTIVE SARS-CoV-2 nucleic acids MAY BE PRESENT.   A presumptive positive result was obtained on the submitted specimen  and confirmed on repeat testing.  While 2019 novel coronavirus  (SARS-CoV-2) nucleic acids may be present in the submitted sample  additional confirmatory testing may be necessary for epidemiological  and / or clinical management purposes  to differentiate between  SARS-CoV-2 and other Sarbecovirus currently known to infect humans.  If clinically indicated additional testing with an alternate test  methodology [phone]) is advised. The SARS-CoV-2 RNA is generally  detectable in upper and lower respiratory sp ecimens during the acute  phase of infection. The expected result is Negative. Fact Sheet for Patients:  StrictlyIdeas.no Fact Sheet for Healthcare Providers: BankingDealers.co.za This test is not yet approved or cleared by the [place] FDA and has been authorized for detection and/or diagnosis of SARS-CoV-2 by FDA under an Emergency Use Authorization (EUA).  This EUA will remain in effect (meaning this test can be used) for the duration of the COVID-19 declaration under Section 564(b)(1) of the Act, 21 U.S.C. section 360bbb-3(b)(1), unless the authorization is terminated or revoked sooner. Performed at [facility], [place] [address] [place], [place] [address]   CBC with Differential     Status: Abnormal   Collection Time: 03/31/19 10:50 AM  Result Value Ref Range   WBC 9.3 4.0 - 10.5 K/uL   RBC 5.18 4.22 - 5.81 MIL/uL   Hemoglobin 13.3 13.0 - 17.0 g/dL   HCT 42.9 39.0 - 52.0 %   MCV 82.8 80.0 - 100.0 fL   MCH 25.7 (L) 26.0 - 34.0 pg   MCHC 31.0 30.0 - 36.0 g/dL   RDW 18.7 (H) 11.5 - 15.5 %   Platelets 150 150  - 400 K/uL    Comment: REPEATED TO VERIFY   nRBC 0.0 0.0 - 0.2 %   Neutrophils Relative % 85 %   Neutro Abs 7.9 (H) 1.7 - 7.7 K/uL   Lymphocytes Relative 10 %   Lymphs Abs 0.9 0.7 - 4.0 K/uL   Monocytes Relative 4 %   Monocytes Absolute 0.4 0.1 - 1.0 K/uL   Eosinophils Relative 1 %  Eosinophils Absolute 0.1 0.0 - 0.5 K/uL   Basophils Relative 0 %   Basophils Absolute 0.0 0.0 - 0.1 K/uL   Immature Granulocytes 0 %   Abs Immature Granulocytes 0.02 0.00 - 0.07 K/uL    Comment: Performed at [facility], [place] [address], [place], [place] [address]  Comprehensive metabolic panel     Status: Abnormal   Collection Time: 03/31/19 10:50 AM  Result Value Ref Range   Sodium 137 135 - 145 mmol/L   Potassium 4.7 3.5 - 5.1 mmol/L   Chloride 105 98 - 111 mmol/L   CO2 24 22 - 32 mmol/L   Glucose, Bld 172 (H) 70 - 99 mg/dL   BUN 23 8 - 23 mg/dL   Creatinine, Ser 0.79 0.61 - 1.24 mg/dL   Calcium 8.8 (L) 8.9 - 10.3 mg/dL   Total Protein 7.7 6.5 - 8.1 g/dL   Albumin 3.5 3.5 - 5.0 g/dL   AST 31 15 - 41 U/L   ALT 23 0 - 44 U/L   Alkaline Phosphatase 53 38 - 126 U/L   Total Bilirubin 0.9 0.3 - 1.2 mg/dL   GFR calc non Af Amer >60 >60 mL/min   GFR calc Af Amer >60 >60 mL/min   Anion gap 8 5 - 15    Comment: Performed at [facility] [address] [place], [place] [address]  Lipase, blood     Status: None   Collection Time: 03/31/19 10:50 AM  Result Value Ref Range   Lipase 31 11 - 51 U/L    Comment: Performed at [facility] [address] [place], [place] [address]  Lactic acid, plasma     Status: None   Collection Time: 03/31/19 10:50 AM  Result Value Ref Range   Lactic Acid, Venous 1.9 0.5 - 1.9 mmol/L    Comment: Performed at [facility] [address], [place], [place] [address]  Urinalysis, Routine w reflex microscopic     Status: Abnormal   Collection Time: 03/31/19 12:20 PM  Result Value Ref Range   Color, Urine YELLOW YELLOW   APPearance CLOUDY (A)  CLEAR   Specific Gravity, Urine 1.010 1.005 - 1.030   pH 5.0 5.0 - 8.0   Glucose, UA NEGATIVE NEGATIVE mg/dL   Hgb urine dipstick SMALL (A) NEGATIVE   Bilirubin Urine NEGATIVE NEGATIVE   Ketones, ur NEGATIVE NEGATIVE mg/dL   Protein, ur NEGATIVE NEGATIVE mg/dL   Nitrite NEGATIVE NEGATIVE   Leukocytes,Ua LARGE (A) NEGATIVE   RBC / HPF 11-20 0 - 5 RBC/hpf   WBC, UA >50 (H) 0 - 5 WBC/hpf   Bacteria, UA MANY (A) NONE SEEN   Squamous Epithelial / LPF 0-5 0 - 5   WBC Clumps PRESENT    Mucus PRESENT     Comment: Performed at [facility], [place] [address], [place], [place] [address]   Dg Chest Portable 1 View  Result Date: 03/31/2019 CLINICAL DATA:  Fever.  Altered mental status. EXAM: PORTABLE CHEST 1 VIEW COMPARISON:  11/16/2018 FINDINGS: Heart size and pulmonary vascularity are normal. Pacemaker in place. Slight haziness at the right lung base could represent an early infiltrate. Slight linear scarring in the left midzone, unchanged. No effusions. No acute bone abnormality. IMPRESSION: Slight haziness at the right lung base. This could represent an early infiltrate. Electronically Signed   By: Lorriane Shire M.D.   On: 03/31/2019 10:05    Pending Labs [employer] (From  admission, onward)    Start     Ordered   03/31/19 0943  Urine culture  ONCE - STAT,   STAT     03/31/19 0943   03/31/19 0943  Lactic acid, plasma  Now then every 2 hours,   STAT     03/31/19 0943   03/31/19 0943  Blood culture (routine x 2)  BLOOD CULTURE X 2,   STAT     03/31/19 0943   Signed and Held  Culture, sputum-assessment  Once,   R     Signed and Held   Signed and Held  Legionella Pneumophila Serogp 1 Ur Ag  Once,   R     Signed and Held   Signed and Held  Strep pneumoniae urinary antigen  Once,   R     Signed and Held          Vitals/Pain Today's Vitals   03/31/19 2230 03/31/19 2245 03/31/19 2300 03/31/19 2315  BP: (!) 143/55 114/84 116/72 (!) 105/59  Pulse:      Resp: 16 19 20 17   Temp:       TempSrc:      SpO2:      Weight:      PainSc:        Isolation Precautions No active isolations  Medications Medications  HYDROmorphone (DILAUDID) injection 0.5 mg (0.5 mg Intravenous Given 03/31/19 2325)  vancomycin (VANCOCIN) 500 mg in sodium chloride 0.9 % 100 mL IVPB (has no administration in time range)  acetaminophen (TYLENOL) suppository 650 mg (650 mg Rectal Given 03/31/19 1057)  ceFEPIme (MAXIPIME) 2 g in sodium chloride 0.9 % 100 mL IVPB (0 g Intravenous Stopped 03/31/19 1342)  vancomycin (VANCOCIN) 1,250 mg in sodium chloride 0.9 % 250 mL IVPB (0 mg Intravenous Stopped 03/31/19 1514)    Mobility manual wheelchair High fall risk   Focused Assessments Community Acquired Pneumonia   R Recommendations: See Admitting Provider Note  Report given to:   Additional Notes: Pt is a bi. AKA and uses a wheelchair at home.  Pt has condom  Cath in place

## 2019-03-31 NOTE — ED Provider Notes (Signed)
[facility] EMERGENCY DEPARTMENT Provider Note   CSN: [id number] Arrival date & time: 03/31/19  [id number]     History   Chief Complaint Chief Complaint  Patient presents with  . Abdominal Pain  . Altered Mental Status    HPI Jaime [name] is a 78 y.o. male with h/o DM on insulin, bilateral AKA wheelchair/bed bound, HLD, HTN, MSand phantom limb pain, dementia, CAD, CHB s/p St Jude pacemaker, chronic systolic HF, severe PVD, recurrent UTI, on palliative care brought to the ER by EMS for reported altered mental status, decreased mentation and somnolence that began last night noticed by wife.  He is febrile with oral temperature 102.3.  Per EMS SPO2 was 90% on room air in the field and he is placed on supplemental oxygen.  There is report of recurrent UTI.  He has a condom cath on.  Patient is oriented to name and Jaime [name].  He tells me he has pain in his left abdomen and "everywhere".  He intermittently screams out in pain and grabs that his legs.  He denies any headache, neck pain, chest pain, cough, vomiting or diarrhea.  Level 5 caveat due to dementia.  I will attempt to call his wife to obtain pertinent PMH.     HPI  Past Medical History:  Diagnosis Date  . Atherosclerotic PVD with ulceration ([place])    left foot  . Bacteremia   . CAD (coronary artery disease) 2007   Dr Meda Coffee -  non-STEMI, .occluded circumflex.. Taxus stent placed...residual 80% LAD...50% RCA  . Cardiomyopathy, ischemic    EF 45% per ECHO 2008  //   EF 25%, echo, August, 2013 //  Echo (8/15):  Mild LVH, EF 30-35%, ant-lat and lat AK, inf HK, Gr 1 DD, mild MR, mild LAE  . Carotid artery disease ([place])    a.  Doppler, February, 2012, 0-39% bilateral,Mild smooth plaque;  b.  Carotid US (8/15):  Bilateral 1-39% ICA >>> F/u 2 years  . Constipation   . Dementia ([place])   . Diabetes mellitus    INSULIN-DEPENDent  . Fatigue SEVERE  . H/O pleural effusion 2008   POST THORACENTESIS  . History of colon  polyps PRECANCEROUS  . Hyperlipidemia   . Hypertension   . Impotence   . Increased prostate specific antigen (PSA) velocity   . Insomnia   . Lung nodule    resolved 11-2006 CT Chest  . Multiple sclerosis ([place]) [place] -- LAST VISIT 11-20-2010  NOTE W/ CHART  . PAC (premature atrial contraction)    December, 2013  . Pulmonary hypertension ([place])    moderate ECHO Jan 2008  . Scoliosis associated with other condition   . Second degree Mobitz I AV block 11/12/2018  . Sleep apnea   . Symptomatic bradycardia 11/13/2018  . Systolic heart failure   . TIA (transient ischemic attack)   . Tobacco abuse    quit   . Urinary retention    dx ~ 2-12, like from [place], now with a catheter, saw urology  . Urinary tract infection    hx of    Patient Active Problem List   Diagnosis Date Noted  . Community acquired pneumonia 03/31/2019  . Heart block AV complete ([place]) 02/12/2019  . Biventricular cardiac pacemaker in situ 02/12/2019  . Acute metabolic encephalopathy [id number]  . 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 11/16/2018  . Second degree Mobitz I AV block 11/12/2018  .  Abnormal liver function   . UTI due to Klebsiella species 11/24/2017  . Hypoglycemia   . Gram-negative bacteremia   . Cool skin 07/18/2017  . Hematuria 04/29/2017  . GERD (gastroesophageal reflux disease) 04/10/2016  . Coronary artery disease involving native coronary artery of native heart without angina pectoris 12/28/2015  . PAD (peripheral artery disease) ([place]) 12/28/2015  . Symptomatic bradycardia 08/14/2015  . Chronic systolic heart failure ([place]) 08/14/2015  . Phantom limb pain ([place]) 12/08/2014  . Syncope 11/01/2014  . Severe sepsis with septic shock ([place]) 08/18/2014  . Acute lower UTI 08/18/2014  . Elevated troponin 08/18/2014  . 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 Paraparesis of both lower limbs ([place]) 05/31/2014  . Scoliosis,  thoracogenic, acquired 03/15/2014  . Mild cognitive impairment 02/11/2014  . PVD (peripheral vascular disease) ([place]) 08/13/2013  . S/P AKA (above knee amputation) bilateral ([place]) 08/03/2012  . PAC (premature atrial contraction)   . Peripheral vascular disease, unspecified ([place]) 07/03/2012  . 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 Ejection fraction   . Cardiomyopathy, ischemic   . Pulmonary hypertension ([place])   . Hemiparesis ([place])   . Essential hypertension   . Carotid artery disease ([place])   . Hyperlipidemia 07/31/2007  . BPH (benign prostatic hyperplasia) 04/15/2007  . DMII (diabetes mellitus, type 2) ([place]) 12/18/2006    Past Surgical History:  Procedure Laterality Date  . ABDOMINAL ANGIOGRAM  12/17/2011   Procedure: ABDOMINAL ANGIOGRAM;  Surgeon: Serafina Mitchell, MD;  Location: [facility] CATH LAB;  Service: Cardiovascular;;  . ABDOMINAL AORTAGRAM N/A 08/19/2013   Procedure: ABDOMINAL Maxcine Ham;  Surgeon: Conrad [place], MD;  Location: [facility] CATH LAB;  Service: Cardiovascular;  Laterality: N/A;  . AMPUTATION  03/17/2012   Procedure: AMPUTATION ABOVE KNEE;  Surgeon: Conrad Coleman, MD;  Location: [facility];  Service: Vascular;  Laterality: Left;  . AMPUTATION  06/03/2012   Procedure: AMPUTATION ABOVE KNEE;  Surgeon: Conrad [place], MD;  Location: [facility];  Service: Vascular;  Laterality: Right;  . BIV PACEMAKER INSERTION CRT-P N/A 11/12/2018   Procedure: BIV PACEMAKER INSERTION CRT-P;  Surgeon: Evans Lance, MD;  Location: [facility] CV LAB;  Service: Cardiovascular;  Laterality: N/A;  . CHOLECYSTECTOMY N/A 10/25/2014   Procedure: LAPAROSCOPIC CHOLECYSTECTOMY ;  Surgeon: Coralie Keens, MD;  Location: [facility];  Service: General;  Laterality: N/A;  . CORONARY ANGIOPLASTY WITH STENT PLACEMENT  05-01-2006   OCCLUDED CIRCUMFLEX -- TAXUS STENT PLACMENT  AND RESIDUAL 80% LAD,  50% RCA  . CYSTOSCOPY  07/30/2011   Procedure: CYSTOSCOPY;  Surgeon: Hanley Ben, MD;  Location[facility];  Service: Urology;  Laterality: N/A;  . ERCP N/A 08/25/2014   Procedure: ENDOSCOPIC RETROGRADE CHOLANGIOPANCREATOGRAPHY (ERCP);  Surgeon: Ladene Artist, MD;  Location: [facility] ENDOSCOPY;  Service: Endoscopy;  Laterality: N/A;  . FEMORAL-POPLITEAL BYPASS GRAFT  03/11/2012   Procedure: BYPASS GRAFT FEMORAL-POPLITEAL ARTERY;  Surgeon: Conrad Tuscola, MD;  Location: [facility];  Service: Vascular;  Laterality: Left;  embolectomy left lower leg  . FEMORAL-TIBIAL BYPASS GRAFT  03/11/2012   Procedure: BYPASS GRAFT FEMORAL-TIBIAL ARTERY;  Surgeon: Conrad Milan, MD;  Location: [facility] OR;  Service: Vascular;  Laterality: Left;  Left Femoral -Tibial trunk bypass, Endarterectomy of Tibial- Peroneal trunk with vein angioplasty.  Marland Kitchen HERNIA REPAIR  1990   (R)  . INTRAOPERATIVE ARTERIOGRAM  03/11/2012   Procedure: INTRA OPERATIVE ARTERIOGRAM;  Surgeon: Conrad Henry, MD;  Location: [facility];  Service: Vascular;  Laterality: Left;  . LOWER EXTREMITY ANGIOGRAM Bilateral 12/17/2011   Procedure: LOWER EXTREMITY ANGIOGRAM;  Surgeon: Serafina Mitchell, MD;  Location: [facility] CATH LAB;  Service: Cardiovascular;  Laterality: Bilateral;  bil lower extrem angio  . THORACENTESIS  2008   PLEURAL EFFUSION  . TRANSURETHRAL RESECTION OF PROSTATE  07/30/2011   Procedure: TRANSURETHRAL RESECTION OF THE PROSTATE (TURP);  Surgeon: Hanley Ben, MD;  Location[facility];  Service: Urology;  Laterality: N/A;        Home Medications    Prior to Admission medications   Medication Sig Start Date End Date Taking? Authorizing Provider  albuterol (PROVENTIL HFA;VENTOLIN HFA) 108 (90 Base) MCG/ACT inhaler Inhale 2 puffs into the lungs every 6 (six) hours as needed for wheezing or shortness of breath. 12/28/15  Yes Dorothy Spark, MD  aspirin EC 81 MG tablet Take 1 tablet (81 mg total) by mouth daily. 07/07/17  Yes Dorothy Spark, MD  baclofen (LIORESAL) 10 MG tablet Take 0.5 tablets (5 mg total) by mouth 2 (two) times  daily. 07/10/18  Yes [name], Adam R, DO  carvedilol (COREG) 3.125 MG tablet Take 1 tablet (3.125 mg total) by mouth 2 (two) times daily with a meal. 11/13/18  Yes Baldwin Jamaica, PA-C  cetirizine (ZYRTEC) 10 MG tablet Take 10 mg by mouth daily. 10/19/18  Yes [provider]  Cholecalciferol (VITAMIN D-3) 1000 units CAPS Take 2,000 Units by mouth daily.   Yes [provider]  diclofenac sodium (VOLTAREN) 1 % GEL Apply 1 application topically 2 (two) times daily as needed (for pain).  03/09/12  Yes Kirsteins, Luanna Salk, MD  furosemide (LASIX) 40 MG tablet Take 40 mg by mouth daily.   Yes [provider]  guaiFENesin (ROBITUSSIN) 100 MG/5ML SOLN Take 5 mLs by mouth every 4 (four) hours as needed for cough or to loosen phlegm.   Yes [provider]  insulin aspart protamine - aspart (NOVOLOG 70/30 MIX) (70-30) 100 UNIT/ML FlexPen Inject 0.05 mLs (5 Units total) into the skin 2 (two) times daily. 03/12/17  Yes Burns, Claudina Lick, MD  lisinopril (PRINIVIL,ZESTRIL) 10 MG tablet TAKE 1 TABLET BY MOUTH EVERY DAY Patient taking differently: Take 10 mg by mouth daily.  05/26/17  Yes Burns, Claudina Lick, MD  mupirocin ointment (BACTROBAN) 2 % Apply 1 application topically as needed (rash on back).  02/16/19  Yes [provider]  pantoprazole (PROTONIX) 40 MG tablet Take 1 tablet (40 mg total) by mouth daily with lunch. 12/22/17  Yes Burns, Claudina Lick, MD  pregabalin (LYRICA) 150 MG capsule TAKE ONE CAPSULE BY MOUTH EVERY MORNING AND 2 CAPSULES EVERY NIGHT AT BEDTIME Patient taking differently: Take 150 mg by mouth See admin instructions. Tale 150 mg in the morning and 300 mg in the evening 02/02/19  Yes Jaffe, Adam R, DO  Propylene Glycol (SYSTANE BALANCE OP) Place 1-2 drops into both eyes daily as needed (dry eyes).    Yes [provider]  rosuvastatin (CRESTOR) 20 MG tablet TAKE 1 TABLET(20 MG) BY MOUTH DAILY Patient taking differently: Take 20 mg by mouth daily.  04/13/18  Yes  Burns, Claudina Lick, MD  traMADol (ULTRAM) 50 MG tablet Take 1 tablet (50 mg total) by mouth every 4 (four) hours as needed (for pain). 11/19/18  Yes Caren Griffins, MD  traZODone (DESYREL) 100 MG tablet Take 100 mg by mouth at bedtime.  11/09/18  Yes [provider]  amoxicillin (AMOXIL) 500 MG tablet Take 4  tablets (2,000 mg) 30 minutes prior to dental procedure Patient not taking: Reported on 03/31/2019 12/28/18   Evans Lance, MD  azelastine (ASTELIN) 0.1 % nasal spray Place 2 sprays into both nostrils at bedtime as needed for rhinitis. Use in each nostril as directed Patient not taking: Reported on 03/31/2019 08/28/15   Colon Branch, MD  glucagon (GLUCAGEN) 1 MG SOLR injection Inject 1 mg into the muscle once as needed for low blood sugar. Reported on 11/16/2015    [provider]    Family History Family History  Problem Relation Age of Onset  . Heart attack Mother 64  . Heart disease Mother   . Stroke Mother   . Hyperlipidemia Mother   . Hypertension Mother   . COPD Father   . Peripheral vascular disease Father   . Diabetes Brother   . Heart disease Brother   . Hypertension Brother   . Heart attack Brother   . Diabetes Daughter   . Colon cancer Neg Hx   . Prostate cancer Neg Hx     Social History Social History   Tobacco Use  . Smoking status: Former Smoker    Years: 50.00    Quit date: 07/22/2012    Years since quitting: 6.6  . Smokeless tobacco: Current User  . Tobacco comment: pt states that he is using E-cigs  Substance Use Topics  . Alcohol use: No    Alcohol/week: 0.0 standard drinks  . Drug use: No     Allergies   Bee venom, Influenza vaccines, Atorvastatin, and Latex   Review of Systems Review of Systems  Unable to perform ROS: Dementia  Constitutional: Positive for activity change and fever.       Altered mental status  All other systems reviewed and are negative.    Physical Exam Updated Vital Signs BP (!) 141/122 (BP Location: Right  Arm)   Pulse 83   Temp (!) 102.3 F (39.1 C) (Oral)   Resp 16   Wt 67.1 kg   SpO2 100%   BMI 20.64 kg/m   Physical Exam Vitals signs and nursing note reviewed.  Constitutional:      General: He is not in acute distress.    Appearance: He is well-developed.     Comments: Patient found asleep, snoring loudly.  He does not wake up to voice but will wake up to shoulder rub.  Frequently falls asleep during exam.  HENT:     Head: Normocephalic and atraumatic.     Right Ear: External ear normal.     Left Ear: External ear normal.     Nose: Nose normal.     Mouth/Throat:     Comments: Dry lips but moist tongue and mucous membranes. Eyes:     General: No scleral icterus.    Conjunctiva/sclera: Conjunctivae normal.  Neck:     Musculoskeletal: Normal range of motion and neck supple.  Cardiovascular:     Rate and Rhythm: Normal rate and regular rhythm.     Heart sounds: Normal heart sounds.     Comments: 1+ radial and femoral pulses bilaterally. Pulmonary:     Effort: Pulmonary effort is normal.     Breath sounds: Decreased breath sounds present.     Comments: Patient frequently falls asleep during exam and during lung auscultation.  Snoring throughout exam limits exam.  Rhonchi in the upper lobes, which resolves when he is woken up and sat straight in the bed.  Diminished lung sounds to the lower  lobes bilaterally.  On supplemental O2.  SPO2 between 92 to 95% on room air while asleep and during my exam. Abdominal:     General: Abdomen is flat. Bowel sounds are normal.     Palpations: Abdomen is soft.     Tenderness: There is abdominal tenderness.     Comments: Patient frequently falls asleep during exam.  Intermittently winces with deep palpation of the left mid and left lower abdomen.  Musculoskeletal: Normal range of motion.        General: No deformity.     Comments: S/p bilateral AKA.  Surgical wounds appear well-healed without dehiscence, signs of infection  Skin:    General:  Skin is warm and dry.     Capillary Refill: Capillary refill takes less than 2 seconds.     Comments: Skin over the lumbar, sacral, buttock and peri-anal area is normal without breakdown, wounds  Neurological:     Mental Status: He is alert. He is disoriented.     Comments:  Alert and oriented to self, place only. Speech is slightly mumbled but no dysarthria or dysphasia. Strength 5/5 with hand grip. Unable to test lower extremity strength due to AMS.   Unable to test sensation due to AMS/frequently falls asleep. CN VII smile is symmetric Unable to perform full neuro exam due to AMS/somnolence and difficulty following commands  Psychiatric:        Behavior: Behavior normal.        Thought Content: Thought content normal.        Judgment: Judgment normal.      ED Treatments / Results  Labs (all labs ordered are listed, but only abnormal results are displayed) Labs Reviewed  CBC WITH DIFFERENTIAL/PLATELET - Abnormal; Notable for the following components:      Result Value   MCH 25.7 (*)    RDW 18.7 (*)    Neutro Abs 7.9 (*)    All other components within normal limits  COMPREHENSIVE METABOLIC PANEL - Abnormal; Notable for the following components:   Glucose, Bld 172 (*)    Calcium 8.8 (*)    All other components within normal limits  URINALYSIS, ROUTINE W REFLEX MICROSCOPIC - Abnormal; Notable for the following components:   APPearance CLOUDY (*)    Hgb urine dipstick SMALL (*)    Leukocytes,Ua LARGE (*)    WBC, UA >50 (*)    Bacteria, UA MANY (*)    All other components within normal limits  SARS CORONAVIRUS 2 (HOSPITAL ORDER, [facility])  URINE CULTURE  CULTURE, BLOOD (ROUTINE X 2)  CULTURE, BLOOD (ROUTINE X 2)  LIPASE, BLOOD  LACTIC ACID, PLASMA  LACTIC ACID, PLASMA  I-STAT VENOUS BLOOD GAS, ED    EKG EKG Interpretation  Date/Time:  Wednesday March 31 2019 09:21:37 EDT Ventricular Rate:  80 PR Interval:    QRS Duration: 125 QT  Interval:  382 QTC Calculation: 441 R Axis:   -78 Text Interpretation:  paced, no sig chnage from previous Confirmed by Charlesetta Shanks [phone]) on 03/31/2019 9:51:20 AM   Radiology Dg Chest Portable 1 View  Result Date: 03/31/2019 CLINICAL DATA:  Fever.  Altered mental status. EXAM: PORTABLE CHEST 1 VIEW COMPARISON:  11/16/2018 FINDINGS: Heart size and pulmonary vascularity are normal. Pacemaker in place. Slight haziness at the right lung base could represent an early infiltrate. Slight linear scarring in the left midzone, unchanged. No effusions. No acute bone abnormality. IMPRESSION: Slight haziness at the right lung base. This could  represent an early infiltrate. Electronically Signed   By: Lorriane Shire M.D.   On: 03/31/2019 10:05    Procedures .Critical Care Performed by: Kinnie Feil, PA-C Authorized by: Kinnie Feil, PA-C   Critical care provider statement:    Critical care time (minutes):  45   Critical care was necessary to treat or prevent imminent or life-threatening deterioration of the following conditions:  Respiratory failure   Critical care was time spent personally by me on the following activities:  Discussions with consultants, evaluation of patient's response to treatment, examination of patient, ordering and performing treatments and interventions, ordering and review of laboratory studies, ordering and review of radiographic studies, pulse oximetry, re-evaluation of patient's condition, obtaining history from patient or surrogate, review of old charts and development of treatment plan with patient or surrogate   I assumed direction of critical care for this patient from another provider in my specialty: no     (including critical care time)  Medications Ordered in ED Medications  HYDROmorphone (DILAUDID) injection 0.5 mg (has no administration in time range)  vancomycin (VANCOCIN) 1,250 mg in sodium chloride 0.9 % 250 mL IVPB (1,250 mg Intravenous New  Bag/Given 03/31/19 1337)  vancomycin (VANCOCIN) 500 mg in sodium chloride 0.9 % 100 mL IVPB (has no administration in time range)  acetaminophen (TYLENOL) suppository 650 mg (650 mg Rectal Given 03/31/19 1057)  ceFEPIme (MAXIPIME) 2 g in sodium chloride 0.9 % 100 mL IVPB (0 g Intravenous Stopped 03/31/19 1342)     Initial Impression / Assessment and Plan / ED Course  I have reviewed the triage vital signs and the nursing notes.  Pertinent labs & imaging results that were available during my care of the patient were reviewed by me and considered in my medical decision making (see chart for details).  Clinical Course as of Mar 30 1350  Wed Mar 31, 2019  1043 IMPRESSION: Slight haziness at the right lung base. This could represent an early infiltrate.  DG Chest Portable 1 View [CG]  1043 Temp(!): 102.3 F (39.1 C) [CG]  1044 On supplemental oxygen, EMS reported SpO2 90% in field   SpO2: 100 % [CG]  1258 Spoke to wife who states pt has been feeling bad for 1 week.  +cough, "rattle" in chest with breathing, SOB. Noticed oral fever and increased WOB today. BP at home in 210/110s.  He does not wear oxygen at home.  Pt has condom cath due to bed/wheelchair bound, AKA and scoliosis he does not sit up/get up on his own.  No COVID exposures.   [CG]  1316 Leukocytes,Ua(!): LARGE [CG]  1317 WBC, UA(!): >50 [CG]  1317 Bacteria, UA(!): MANY [CG]    Clinical Course User Index [CG] Kinnie Feil, PA-C   Pt's EMR reviewed to obtain pertinent PMH. He is a patient of [facility]. Full code.   Arrives febrile, hypoxemic requiring supplemental oxygen.    Ddx includes occult infection including viral URI LRI, PNA, UTI.  Level 5 caveat 2/2 dementia.   Wife confirms 1 week of dry cough, generalized malaise, "rattling", fever and SOB today. No covid exposure.   ER work up reviewed by me remarkable for infiltrate in RLL. COVID negative. No leukocytosis or lactic acidosis. BP normal.  Mild  respiratory distress requiring supplemental oxygen, somnolent but arousable. Will add vbg.    Given cefepine, vancomycin, tylenol suppository, dilaudid for likely phantom limb pain.    Will admit for borderline respiratory failure 2/2 CAP. UA pending.  Shared with EDP.  Final Clinical Impressions(s) / ED Diagnoses   Final diagnoses:  Community acquired pneumonia of right lower lobe of lung [facility])    ED Discharge Orders    None       Kinnie Feil, PA-C 03/31/19 1351    Charlesetta Shanks, MD 04/02/19 1157

## 2019-03-31 NOTE — H&P (Signed)
[facility]                                                                                    Patient Demographics  Jaime [name], is a 78 y.o. male  CSN: [id number]  MRN: [id number]  DOB - [birth year]  Admit Date - 03/31/2019  Outpatient Primary [name] for the patient is Jaime [name]   With History of -  Past Medical History:  Diagnosis Date  . Atherosclerotic PVD with ulceration ([place])    left foot  . Bacteremia   . CAD (coronary artery disease) 2007   Dr Jaime [name] -  non-STEMI, .occluded circumflex.. Taxus stent placed...residual 80% LAD...50% RCA  . Cardiomyopathy, ischemic    EF 45% per ECHO 2008  //   EF 25%, echo, August, 2013 //  Echo (8/15):  Mild LVH, EF 30-35%, ant-lat and lat AK, inf HK, Gr 1 DD, mild MR, mild LAE  . Carotid artery disease ([place])    a.  Doppler, February, 2012, 0-39% bilateral,Mild smooth plaque;  b.  Carotid US (8/15):  Bilateral 1-39% ICA >>> F/u 2 years  . Constipation   . Dementia ([place])   . Diabetes mellitus    INSULIN-DEPENDent  . Fatigue SEVERE  . H/O pleural effusion 2008   POST THORACENTESIS  . History of colon polyps PRECANCEROUS  . Hyperlipidemia   . Hypertension   . Impotence   . Increased prostate specific antigen (PSA) velocity   . Insomnia   . Lung nodule    resolved 11-2006 CT Chest  . Multiple sclerosis ([place]) [place] -- LAST VISIT 11-20-2010  NOTE W/ CHART  . PAC (premature atrial contraction)    December, 2013  . Pulmonary hypertension ([place])    moderate ECHO Jan 2008  . Scoliosis associated with other condition   . Second degree Mobitz I AV block 11/12/2018  . Sleep apnea   . Symptomatic bradycardia 11/13/2018  . Systolic heart failure   . TIA (transient ischemic attack)   . Tobacco abuse    quit   . Urinary retention    dx ~ 2-12, like from [place], now with a catheter, saw urology  . Urinary tract infection    hx of      Past Surgical History:  Procedure Laterality Date  .  ABDOMINAL ANGIOGRAM  12/17/2011   Procedure: ABDOMINAL ANGIOGRAM;  Surgeon: Jaime Mitchell, [name];  Location: [facility] CATH LAB;  Service: Cardiovascular;;  . ABDOMINAL AORTAGRAM N/A 08/19/2013   Procedure: ABDOMINAL Maxcine Ham;  Surgeon: Jaime [place], [name];  Location: [facility] CATH LAB;  Service: Cardiovascular;  Laterality: N/A;  . AMPUTATION  03/17/2012   Procedure: AMPUTATION ABOVE KNEE;  Surgeon: Jaime Benham, [name];  Location: [place];  Service: Vascular;  Laterality: Left;  . AMPUTATION  06/03/2012   Procedure: AMPUTATION ABOVE KNEE;  Surgeon: Jaime New Falcon, [name];  Location: [place];  Service: Vascular;  Laterality: Right;  . BIV PACEMAKER INSERTION CRT-P N/A 11/12/2018   Procedure: BIV PACEMAKER INSERTION CRT-P;  Surgeon: Jaime Lance, [name];  Location: [place] CV LAB;  Service: Cardiovascular;  Laterality: N/A;  .  CHOLECYSTECTOMY N/A 10/25/2014   Procedure: LAPAROSCOPIC CHOLECYSTECTOMY ;  Surgeon: Jaime Keens, [name];  Location: [place];  Service: General;  Laterality: N/A;  . CORONARY ANGIOPLASTY WITH STENT PLACEMENT  05-01-2006   OCCLUDED CIRCUMFLEX -- TAXUS STENT PLACMENT  AND RESIDUAL 80% LAD,  50% RCA  . CYSTOSCOPY  07/30/2011   Procedure: CYSTOSCOPY;  Surgeon: Jaime Ben, [name];  Location: [facility];  Service: Urology;  Laterality: N/A;  . ERCP N/A 08/25/2014   Procedure: ENDOSCOPIC RETROGRADE CHOLANGIOPANCREATOGRAPHY (ERCP);  Surgeon: Jaime Artist, [name];  Location: [facility] ENDOSCOPY;  Service: Endoscopy;  Laterality: N/A;  . FEMORAL-POPLITEAL BYPASS GRAFT  03/11/2012   Procedure: BYPASS GRAFT FEMORAL-POPLITEAL ARTERY;  Surgeon: Jaime Wheeler, [name];  Location: [place];  Service: Vascular;  Laterality: Left;  embolectomy left lower leg  . FEMORAL-TIBIAL BYPASS GRAFT  03/11/2012   Procedure: BYPASS GRAFT FEMORAL-TIBIAL ARTERY;  Surgeon: Jaime Gary, [name];  Location: [facility] OR;  Service: Vascular;  Laterality: Left;  Left Femoral -Tibial trunk bypass, Endarterectomy of Tibial- Peroneal trunk with vein angioplasty.   Marland Kitchen HERNIA REPAIR  1990   (R)  . INTRAOPERATIVE ARTERIOGRAM  03/11/2012   Procedure: INTRA OPERATIVE ARTERIOGRAM;  Surgeon: Jaime Washta, [name];  Location: [place];  Service: Vascular;  Laterality: Left;  . LOWER EXTREMITY ANGIOGRAM Bilateral 12/17/2011   Procedure: LOWER EXTREMITY ANGIOGRAM;  Surgeon: Jaime Mitchell, [name];  Location: [facility] CATH LAB;  Service: Cardiovascular;  Laterality: Bilateral;  bil lower extrem angio  . THORACENTESIS  2008   PLEURAL EFFUSION  . TRANSURETHRAL RESECTION OF PROSTATE  07/30/2011   Procedure: TRANSURETHRAL RESECTION OF THE PROSTATE (TURP);  Surgeon: Jaime Ben, [name];  Location: [facility];  Service: Urology;  Laterality: N/A;    in for   Chief Complaint  Patient presents with  . Abdominal Pain  . Altered Mental Status     HPI  Jaime [name]  is a 77 y.o. 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  No report of chest pain.  No urinary symptoms. Family reports altered mental status.  No significant history from patient.  Reviewing his history we noted that he has been on palliative care in the past but was full code in the last hospitalization.  No report of being in the hospital in the last 90 days. In the emergency room the patient was noted to have right sided early pneumonia and UTI.  His white blood cell count was 9.3 he was started on vancomycin and cefepime and downgraded to Rocephin and Zithromax by myself.  He was noted to be hypoxemic and he was started on oxygen. Cultures were taken.   Review of Systems   After is short.  Patient's mental status started improving, Denies chest pains, does not remember any fevers although his wife reported fevers at home.  Denies any shortness of breath at this time.  Denies any  history of nausea vomiting or diarrhea.  No report of being in contact with other sick people. Patient has no urinary symptoms no history of blood in his urine or stools.  Discussed CODE STATUS with him and he wanted to be full code.   Social History Social History   Tobacco Use  . Smoking status: Former Smoker    Years: 50.00    Quit date:  07/22/2012    Years since quitting: 6.6  . Smokeless tobacco: Current User  . Tobacco comment: pt states that he is using E-cigs  Substance Use Topics  . Alcohol use: No    Alcohol/week: 0.0 standard drinks     Family History Family History  Problem Relation Age of Onset  . Heart attack Mother 58  . Heart disease Mother   . Stroke Mother   . Hyperlipidemia Mother   . Hypertension Mother   . COPD Father   . Peripheral vascular disease Father   . Diabetes Brother   . Heart disease Brother   . Hypertension Brother   . Heart attack Brother   . Diabetes Daughter   . Colon cancer Neg Hx   . Prostate cancer Neg Hx      Prior to Admission medications   Medication Sig Start Date End Date Taking? Authorizing Provider  albuterol (PROVENTIL HFA;VENTOLIN HFA) 108 (90 Base) MCG/ACT inhaler Inhale 2 puffs into the lungs every 6 (six) hours as needed for wheezing or shortness of breath. 12/28/15  Yes Dorothy Spark, [name]  aspirin EC 81 MG tablet Take 1 tablet (81 mg total) by mouth daily. 07/07/17  Yes Dorothy Spark, [name]  baclofen (LIORESAL) 10 MG tablet Take 0.5 tablets (5 mg total) by mouth 2 (two) times daily. 07/10/18  Yes [name], Adam R, DO  carvedilol (COREG) 3.125 MG tablet Take 1 tablet (3.125 mg total) by mouth 2 (two) times daily with a meal. 11/13/18  Yes Baldwin Jamaica, PA-C  cetirizine (ZYRTEC) 10 MG tablet Take 10 mg by mouth daily. 10/19/18  Yes Provider, Historical, [name]  Cholecalciferol (VITAMIN D-3) 1000 units CAPS Take 2,000 Units by mouth daily.   Yes Provider, Historical, [name]  diclofenac sodium (VOLTAREN) 1 % GEL Apply 1  application topically 2 (two) times daily as needed (for pain).  03/09/12  Yes Kirsteins, Luanna Salk, [name]  furosemide (LASIX) 40 MG tablet Take 40 mg by mouth daily.   Yes Provider, Historical, [name]  guaiFENesin (ROBITUSSIN) 100 MG/5ML SOLN Take 5 mLs by mouth every 4 (four) hours as needed for cough or to loosen phlegm.   Yes Provider, Historical, [name]  insulin aspart protamine - aspart (NOVOLOG 70/30 MIX) (70-30) 100 UNIT/ML FlexPen Inject 0.05 mLs (5 Units total) into the skin 2 (two) times daily. 03/12/17  Yes Burns, Claudina Lick, [name]  lisinopril (PRINIVIL,ZESTRIL) 10 MG tablet TAKE 1 TABLET BY MOUTH EVERY DAY Patient taking differently: Take 10 mg by mouth daily.  05/26/17  Yes Burns, Claudina Lick, [name]  mupirocin ointment (BACTROBAN) 2 % Apply 1 application topically as needed (rash on back).  02/16/19  Yes Provider, Historical, [name]  pantoprazole (PROTONIX) 40 MG tablet Take 1 tablet (40 mg total) by mouth daily with lunch. 12/22/17  Yes Burns, Claudina Lick, [name]  pregabalin (LYRICA) 150 MG capsule TAKE ONE CAPSULE BY MOUTH EVERY MORNING AND 2 CAPSULES EVERY NIGHT AT BEDTIME Patient taking differently: Take 150 mg by mouth See admin instructions. Tale 150 mg in the morning and 300 mg in the evening 02/02/19  Yes Jaffe, Adam R, DO  Propylene Glycol (SYSTANE BALANCE OP) Place 1-2 drops into both eyes daily as needed (dry eyes).    Yes Provider, Historical, [name]  rosuvastatin (CRESTOR) 20 MG tablet TAKE 1 TABLET(20 MG) BY MOUTH DAILY Patient taking differently: Take 20 mg by mouth daily.  04/13/18  Yes Burns, Claudina Lick, [name]  traMADol (ULTRAM) 50 MG tablet Take 1 tablet (50 mg  total) by mouth every 4 (four) hours as needed (for pain). 11/19/18  Yes Caren Griffins, [name]  traZODone (DESYREL) 100 MG tablet Take 100 mg by mouth at bedtime.  11/09/18  Yes Provider, Historical, [name]  amoxicillin (AMOXIL) 500 MG tablet Take 4 tablets (2,000 mg) 30 minutes prior to dental procedure Patient not taking: Reported on 03/31/2019 12/28/18   Jaime Lance,  [name]  azelastine (ASTELIN) 0.1 % nasal spray Place 2 sprays into both nostrils at bedtime as needed for rhinitis. Use in each nostril as directed Patient not taking: Reported on 03/31/2019 08/28/15   Colon Branch, [name]  glucagon (GLUCAGEN) 1 MG SOLR injection Inject 1 mg into the muscle once as needed for low blood sugar. Reported on 11/16/2015    Provider, Historical, [name]    Allergies  Allergen Reactions  . Bee Venom Anaphylaxis  . Influenza Vaccines Other (See Comments)    Made patient "sick for months"  . Atorvastatin Other (See Comments)    Severe pain in stumps  . Latex Rash    Physical Exam  Vitals  Blood pressure (!) 141/122, pulse 83, temperature (!) 102.3 F (39.1 C), temperature source Oral, resp. rate 16, weight 67.1 kg, SpO2 100 %.  General appearance: Extremely pleasant male looks tired, alert awake oriented x3 HEENT no jaundice or pallor, no facial deviation oral thrush Neck supple, no neck vein distention Chest decreased breath sounds bilaterally Heart normal S1-S2, no murmurs gallops or rubs Abdomen soft, nontender bowel sounds present Extremities status post bilateral amputation above knee. Neuro grossly nonfocal Skin no rashes or ulcers .   Data Review  CBC Recent Labs  Lab 03/31/19 1050  WBC 9.3  HGB 13.3  HCT 42.9  PLT 150  MCV 82.8  MCH 25.7*  MCHC 31.0  RDW 18.7*  LYMPHSABS 0.9  MONOABS 0.4  EOSABS 0.1  BASOSABS 0.0   ------------------------------------------------------------------------------------------------------------------  Chemistries  Recent Labs  Lab 03/31/19 1050  NA 137  K 4.7  CL 105  CO2 24  GLUCOSE 172*  BUN 23  CREATININE 0.79  CALCIUM 8.8*  AST 31  ALT 23  ALKPHOS 53  BILITOT 0.9   ------------------------------------------------------------------------------------------------------------------ CrCl cannot be calculated (Unknown ideal  weight.). ------------------------------------------------------------------------------------------------------------------ No results for input(s): TSH, T4TOTAL, T3FREE, THYROIDAB in the last 72 hours.  Invalid input(s): FREET3   Coagulation profile No results for input(s): INR, PROTIME in the last 168 hours. ------------------------------------------------------------------------------------------------------------------- No results for input(s): DDIMER in the last 72 hours. -------------------------------------------------------------------------------------------------------------------  Cardiac Enzymes No results for input(s): CKMB, TROPONINI, MYOGLOBIN in the last 168 hours.  Invalid input(s): CK ------------------------------------------------------------------------------------------------------------------ Invalid input(s): POCBNP   ---------------------------------------------------------------------------------------------------------------  Urinalysis    Component Value Date/Time   COLORURINE YELLOW 03/31/2019 1220   APPEARANCEUR CLOUDY (A) 03/31/2019 1220   LABSPEC 1.010 03/31/2019 1220   PHURINE 5.0 03/31/2019 1220   GLUCOSEU NEGATIVE 03/31/2019 1220   GLUCOSEU NEGATIVE 04/29/2017 1211   HGBUR SMALL (A) 03/31/2019 1220   BILIRUBINUR NEGATIVE 03/31/2019 1220   KETONESUR NEGATIVE 03/31/2019 1220   PROTEINUR NEGATIVE 03/31/2019 1220   UROBILINOGEN 0.2 04/29/2017 1211   NITRITE NEGATIVE 03/31/2019 1220   LEUKOCYTESUR LARGE (A) 03/31/2019 1220    ----------------------------------------------------------------------------------------------------------------   Imaging results:   Dg Chest Portable 1 View  Result Date: 03/31/2019 CLINICAL DATA:  Fever.  Altered mental status. EXAM: PORTABLE CHEST 1 VIEW COMPARISON:  11/16/2018 FINDINGS: Heart size and pulmonary vascularity are normal. Pacemaker in place. Slight haziness at the right lung base could represent an  early  infiltrate. Slight linear scarring in the left midzone, unchanged. No effusions. No acute bone abnormality. IMPRESSION: Slight haziness at the right lung base. This could represent an early infiltrate. Electronically Signed   By: Lorriane Shire M.D.   On: 03/31/2019 10:05      Personally reviewed Old Chart from 11/19/2018  Assessment & Plan  Community-acquired pneumonia Start Rocephin/Zithromax Has complicating history of multiple sclerosis.  Monitor. Patient has history of smoking and was started also on DuoNeb's Culture sent  UTI Started on Rocephin Follow  urine culture  Multiple sclerosis, progressive Continue baclofen for spasticity  Sick sinus syndrome status post PPM  Dementia with complicated delirium due to disease Hold his trazodone  Diabetes mellitus type 2 Patient started on clear liquid diet at this time for fear of aspiration Check ISS  Goals of care meeting should be checked  DVT Prophylaxis Lovenox  AM Labs Ordered, also please review Full Orders  Family Communication: Try to contact family, [name] his spouse.  Went to [employer].  Code Status full  Disposition Plan: To be determined  Time spent in minutes : 41 minutes  Condition GUARDED   @SIGNATURE @

## 2019-03-31 NOTE — Telephone Encounter (Signed)
Attempted to call the pts wife back and she did not answer, so left her a message to call the office back.

## 2019-03-31 NOTE — Telephone Encounter (Signed)
FYI

## 2019-03-31 NOTE — ED Notes (Signed)
NS 500cc bolus initiated at this time per MD verbal order.

## 2019-03-31 NOTE — Telephone Encounter (Signed)
Spoke with the pts wife and she is calling to inform Dr. Meda Coffee and myself that the pt is currently admitted to [facility] for pneumonia and UTI.  Wife states that they are working him up for sepsis.  Wife wanted to keep Dr. Meda Coffee informed so that she could review any labs/test/images they may do on him.  Also they tested him for covid and that was negative. Informed the pts wife that we can see all test being done and I will most definitely make Dr Meda Coffee aware of this, by routing this communication.  Informed the pts wife that our best wishes are with them both, and if they need anything at all, to please let us know.  Pts wife verbalized understanding and agrees with this plan. Pts wife was more than gracious for all the assistance provided.

## 2019-03-31 NOTE — Telephone Encounter (Signed)
Patient's wife called to inform Dr. Meda Coffee that Jaime [name] is currently in the hospital.

## 2019-04-01 ENCOUNTER — Other Ambulatory Visit: Payer: Self-pay | Admitting: *Deleted

## 2019-04-01 DIAGNOSIS — Z89612 Acquired absence of left leg above knee: Secondary | ICD-10-CM

## 2019-04-01 DIAGNOSIS — I10 Essential (primary) hypertension: Secondary | ICD-10-CM

## 2019-04-01 DIAGNOSIS — Z89611 Acquired absence of right leg above knee: Secondary | ICD-10-CM

## 2019-04-01 DIAGNOSIS — J189 Pneumonia, unspecified organism: Secondary | ICD-10-CM

## 2019-04-01 DIAGNOSIS — Z950 Presence of cardiac pacemaker: Secondary | ICD-10-CM

## 2019-04-01 LAB — GLUCOSE, CAPILLARY
Glucose-Capillary: 135 mg/dL — ABNORMAL HIGH (ref 70–99)
Glucose-Capillary: 138 mg/dL — ABNORMAL HIGH (ref 70–99)
Glucose-Capillary: 221 mg/dL — ABNORMAL HIGH (ref 70–99)
Glucose-Capillary: 93 mg/dL (ref 70–99)
Glucose-Capillary: 94 mg/dL (ref 70–99)

## 2019-04-01 LAB — LACTIC ACID, PLASMA: Lactic Acid, Venous: 1.2 mmol/L (ref 0.5–1.9)

## 2019-04-01 LAB — STREP PNEUMONIAE URINARY ANTIGEN: Strep Pneumo Urinary Antigen: NEGATIVE

## 2019-04-01 LAB — MRSA PCR SCREENING: MRSA by PCR: POSITIVE — AB

## 2019-04-01 MED ORDER — LISINOPRIL 10 MG PO TABS
10.0000 mg | ORAL_TABLET | Freq: Every day | ORAL | Status: DC
  Administered 2019-04-01 – 2019-04-02 (×2): 10 mg via ORAL
  Filled 2019-04-01 (×2): qty 1

## 2019-04-01 MED ORDER — ROSUVASTATIN CALCIUM 20 MG PO TABS
20.0000 mg | ORAL_TABLET | Freq: Every day | ORAL | Status: DC
  Administered 2019-04-01 – 2019-04-02 (×2): 20 mg via ORAL
  Filled 2019-04-01 (×2): qty 1

## 2019-04-01 MED ORDER — INSULIN ASPART 100 UNIT/ML ~~LOC~~ SOLN
0.0000 [IU] | Freq: Three times a day (TID) | SUBCUTANEOUS | Status: DC
  Administered 2019-04-01: 3 [IU] via SUBCUTANEOUS
  Administered 2019-04-02: 2 [IU] via SUBCUTANEOUS

## 2019-04-01 MED ORDER — PREGABALIN 100 MG PO CAPS
300.0000 mg | ORAL_CAPSULE | Freq: Every day | ORAL | Status: DC
  Administered 2019-04-01 (×2): 300 mg via ORAL
  Filled 2019-04-01 (×2): qty 3

## 2019-04-01 MED ORDER — BACLOFEN 10 MG PO TABS
5.0000 mg | ORAL_TABLET | Freq: Two times a day (BID) | ORAL | Status: DC
  Administered 2019-04-01 – 2019-04-02 (×4): 5 mg via ORAL
  Filled 2019-04-01 (×4): qty 1

## 2019-04-01 MED ORDER — SODIUM CHLORIDE 0.9 % IV SOLN
2.0000 g | Freq: Every day | INTRAVENOUS | Status: DC
  Administered 2019-04-01 (×2): 2 g via INTRAVENOUS
  Filled 2019-04-01 (×3): qty 20

## 2019-04-01 MED ORDER — PANTOPRAZOLE SODIUM 40 MG PO TBEC
40.0000 mg | DELAYED_RELEASE_TABLET | Freq: Every day | ORAL | Status: DC
  Administered 2019-04-01 – 2019-04-02 (×2): 40 mg via ORAL
  Filled 2019-04-01 (×2): qty 1

## 2019-04-01 MED ORDER — PREGABALIN 75 MG PO CAPS
150.0000 mg | ORAL_CAPSULE | Freq: Every day | ORAL | Status: DC
  Administered 2019-04-01 – 2019-04-02 (×2): 150 mg via ORAL
  Filled 2019-04-01 (×2): qty 2

## 2019-04-01 MED ORDER — ONDANSETRON HCL 4 MG/2ML IJ SOLN: 4.0000 mg | Freq: Four times a day (QID) | INTRAMUSCULAR | Status: DC | PRN

## 2019-04-01 MED ORDER — INSULIN ASPART 100 UNIT/ML ~~LOC~~ SOLN: 0.0000 [IU] | Freq: Every day | SUBCUTANEOUS | Status: DC

## 2019-04-01 MED ORDER — ONDANSETRON HCL 4 MG PO TABS: 4.0000 mg | ORAL_TABLET | Freq: Four times a day (QID) | ORAL | Status: DC | PRN

## 2019-04-01 MED ORDER — GUAIFENESIN 100 MG/5ML PO SOLN: 5.0000 mL | ORAL | Status: DC | PRN

## 2019-04-01 MED ORDER — SODIUM CHLORIDE 0.9 % IV SOLN
500.0000 mg | Freq: Every day | INTRAVENOUS | Status: DC
  Administered 2019-04-01: 500 mg via INTRAVENOUS
  Filled 2019-04-01 (×3): qty 500

## 2019-04-01 MED ORDER — ENOXAPARIN SODIUM 40 MG/0.4ML ~~LOC~~ SOLN
40.0000 mg | SUBCUTANEOUS | Status: DC
  Administered 2019-04-01 – 2019-04-02 (×2): 40 mg via SUBCUTANEOUS
  Filled 2019-04-01 (×2): qty 0.4

## 2019-04-01 MED ORDER — VITAMIN D 25 MCG (1000 UNIT) PO TABS
2000.0000 [IU] | ORAL_TABLET | Freq: Every day | ORAL | Status: DC
  Administered 2019-04-01 – 2019-04-02 (×2): 2000 [IU] via ORAL
  Filled 2019-04-01 (×2): qty 2

## 2019-04-01 MED ORDER — IPRATROPIUM-ALBUTEROL 0.5-2.5 (3) MG/3ML IN SOLN
3.0000 mL | Freq: Three times a day (TID) | RESPIRATORY_TRACT | Status: DC
  Administered 2019-04-01 – 2019-04-02 (×3): 3 mL via RESPIRATORY_TRACT
  Filled 2019-04-01 (×3): qty 3

## 2019-04-01 MED ORDER — IPRATROPIUM-ALBUTEROL 0.5-2.5 (3) MG/3ML IN SOLN
3.0000 mL | Freq: Four times a day (QID) | RESPIRATORY_TRACT | Status: DC
  Administered 2019-04-01: 3 mL via RESPIRATORY_TRACT
  Filled 2019-04-01: qty 3

## 2019-04-01 MED ORDER — SODIUM CHLORIDE 0.45 % IV SOLN
INTRAVENOUS | Status: DC
  Administered 2019-04-01: 01:00:00 via INTRAVENOUS

## 2019-04-01 MED ORDER — CARVEDILOL 3.125 MG PO TABS
3.1250 mg | ORAL_TABLET | Freq: Two times a day (BID) | ORAL | Status: DC
  Administered 2019-04-01 – 2019-04-02 (×2): 3.125 mg via ORAL
  Filled 2019-04-01 (×3): qty 1

## 2019-04-01 MED ORDER — PREGABALIN 75 MG PO CAPS: 150.0000 mg | ORAL_CAPSULE | ORAL | Status: DC

## 2019-04-01 MED ORDER — SODIUM CHLORIDE 0.9 % IV BOLUS: 500.0000 mL | Freq: Once | INTRAVENOUS | Status: DC

## 2019-04-01 MED ORDER — ASPIRIN EC 81 MG PO TBEC
81.0000 mg | DELAYED_RELEASE_TABLET | Freq: Every day | ORAL | Status: DC
  Administered 2019-04-01 – 2019-04-02 (×2): 81 mg via ORAL
  Filled 2019-04-01 (×2): qty 1

## 2019-04-01 NOTE — Progress Notes (Signed)
Patient ID: Jaime [name], male   DOB: 09/30/1940, 78 y.o.   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  She was started on antibiotics for pneumonia.  Assessment & Plan:   Community-acquired bacterial pneumonia Hypoxia -Continue Rocephin and Zithromax.  Follow cultures.  Follow urine Legionella and streptococcal antigen -Oxygen supplementation as needed.  Initially required 2 L oxygen.  Currently on room air. -SLP evaluation.  Incentive spirometry -COVID-19 testing negative.  UTI  -Follow urine cultures.  Continue Rocephin  Multiple sclerosis, progressive -Patient is bedbound.  Continue baclofen for spasticity.  Outpatient follow-up.  Fall precautions  History of pacemaker placement for Mobitz 1 block -Outpatient follow-up with cardiology/EP  Dementia -Monitor mental status.  Trazodone on hold. -Palliative care evaluation for goals of care discussion.  Diabetes mellitus type II -CBGs with SSI.  Hypertension -Monitor blood pressure.  Continue Coreg and lisinopril.   DVT prophylaxis: Lovenox Code Status: Full Family Communication: Called wife [name] on phone on 04/01/2019 but it went straight to voicemail Disposition Plan: Home in 1 to 2 days if clinically improved  Consultants: None  Procedures: None  Antimicrobials:  Rocephin and Zithromax from 03/31/2019 onwards  Subjective: Patient seen and examined at bedside.  He is pleasantly confused.  No overnight fever or vomiting reported.  Objective: Vitals:   03/31/19 2330 04/01/19 0028 04/01/19 0626 04/01/19 0832  BP: 114/66 (!) 157/73 (!) 132/98   Pulse:  67 96 69  Resp: 13 18 18 18   Temp:   98.4 F (36.9 C) 98 F (36.7 C)   TempSrc:  Oral    SpO2:  98% 98% 99%  Weight:        Intake/Output Summary (Last 24 hours) at 04/01/2019 1004 Last data filed at 04/01/2019 0317 Gross per 24 hour  Intake 206.51 ml  Output 800 ml  Net -593.49 ml   Filed Weights   03/31/19 1100  Weight: 67.1 kg    Examination:  General exam: Appears calm and comfortable.  Pleasantly confused. Respiratory system: Bilateral decreased breath sounds at bases with some scattered crackles Cardiovascular system: S1 & S2 heard, Rate controlled Gastrointestinal system: Abdomen is nondistended, soft and nontender. Normal bowel sounds heard. Extremities: No cyanosis, clubbing; bilateral AKA present   Data Reviewed: I have personally reviewed following labs and imaging studies  CBC: Recent Labs  Lab 03/31/19 1050  WBC 9.3  NEUTROABS 7.9*  HGB 13.3  HCT 42.9  MCV 82.8  PLT [id number]   Basic Metabolic Panel: Recent Labs  Lab 03/31/19 1050  NA 137  K 4.7  CL 105  CO2 24  GLUCOSE 172*  BUN 23  CREATININE 0.79  CALCIUM 8.8*   GFR: CrCl cannot be calculated (Unknown ideal weight.). Liver Function Tests: Recent Labs  Lab 03/31/19 1050  AST 31  ALT 23  ALKPHOS 53  BILITOT 0.9  PROT 7.7  ALBUMIN 3.5   Recent Labs  Lab 03/31/19 1050  LIPASE 31   No results for input(s): AMMONIA in the last 168 hours. Coagulation Profile: No results for input(s): INR, PROTIME in the last 168 hours. Cardiac Enzymes: No results for input(s): CKTOTAL, CKMB, CKMBINDEX, TROPONINI in the last 168 hours. BNP (last 3 results) No  results for input(s): PROBNP in the last 8760 hours. HbA1C: No results for input(s): HGBA1C in the last 72 hours. CBG: Recent Labs  Lab 04/01/19 0037 04/01/19 0814  GLUCAP 135* 94   Lipid Profile: No results for input(s): CHOL, HDL, LDLCALC, TRIG, CHOLHDL, LDLDIRECT in the last 72 hours. Thyroid Function Tests: No results for input(s): TSH, T4TOTAL, FREET4, T3FREE,  THYROIDAB in the last 72 hours. Anemia Panel: No results for input(s): VITAMINB12, FOLATE, FERRITIN, TIBC, IRON, RETICCTPCT in the last 72 hours. Sepsis Labs: Recent Labs  Lab 03/31/19 1050 04/01/19 0246  LATICACIDVEN 1.9 1.2    Recent Results (from the past 240 hour(s))  SARS Coronavirus 2 [facility] order, Performed in [facility] hospital lab) Nasopharyngeal Nasopharyngeal Swab     Status: None   Collection Time: 03/31/19  9:57 AM   Specimen: Nasopharyngeal Swab  Result Value Ref Range Status   SARS Coronavirus 2 NEGATIVE NEGATIVE Final    Comment: (NOTE) If result is NEGATIVE SARS-CoV-2 target nucleic acids are NOT DETECTED. The SARS-CoV-2 RNA is generally detectable in upper and lower  respiratory specimens during the acute phase of infection. The lowest  concentration of SARS-CoV-2 viral copies this assay can detect is 250  copies / mL. A negative result does not preclude SARS-CoV-2 infection  and should not be used as the sole basis for treatment or other  patient management decisions.  A negative result may occur with  improper specimen collection / handling, submission of specimen other  than nasopharyngeal swab, presence of viral mutation(s) within the  areas targeted by this assay, and inadequate number of viral copies  (<250 copies / mL). A negative result must be combined with clinical  observations, patient history, and epidemiological information. If result is POSITIVE SARS-CoV-2 target nucleic acids are DETECTED. The SARS-CoV-2 RNA is generally detectable in upper and lower  respiratory specimens dur ing the acute phase of infection.  Positive  results are indicative of active infection with SARS-CoV-2.  Clinical  correlation with patient history and other diagnostic information is  necessary to determine patient infection status.  Positive results do  not rule out bacterial infection or co-infection with other viruses. If result is PRESUMPTIVE POSTIVE  SARS-CoV-2 nucleic acids MAY BE PRESENT.   A presumptive positive result was obtained on the submitted specimen  and confirmed on repeat testing.  While 2019 novel coronavirus  (SARS-CoV-2) nucleic acids may be present in the submitted sample  additional confirmatory testing may be necessary for epidemiological  and / or clinical management purposes  to differentiate between  SARS-CoV-2 and other Sarbecovirus currently known to infect humans.  If clinically indicated additional testing with an alternate test  methodology [phone]) is advised. The SARS-CoV-2 RNA is generally  detectable in upper and lower respiratory sp ecimens during the acute  phase of infection. The expected result is Negative. Fact Sheet for Patients:  StrictlyIdeas.no Fact Sheet for Healthcare Providers: BankingDealers.co.za This test is not yet approved or cleared by the [place] FDA and has been authorized for detection and/or diagnosis of SARS-CoV-2 by FDA under an Emergency Use Authorization (EUA).  This EUA will remain in effect (meaning this test can be used) for the duration of the COVID-19 declaration under Section 564(b)(1) of the Act, 21 U.S.C. section 360bbb-3(b)(1), unless the authorization is terminated or revoked sooner. Performed at [facility], [place] [address] [place], [place] [address]          Radiology Studies: Dg Chest Portable 1 View  Result Date: 03/31/2019 CLINICAL DATA:  Fever.  Altered mental status. EXAM: PORTABLE CHEST 1 VIEW COMPARISON:  11/16/2018 FINDINGS: Heart size and pulmonary vascularity are normal. Pacemaker in place. Slight haziness at the right lung base could represent an early infiltrate. Slight linear scarring in the left midzone, unchanged. No effusions. No acute bone abnormality. IMPRESSION: Slight haziness at the right lung base. This could represent an early infiltrate. Electronically Signed   By: Lorriane Shire M.D.   On: 03/31/2019 10:05        Scheduled Meds: . aspirin EC  81 mg Oral Daily  . baclofen  5 mg Oral BID  . carvedilol  3.125 mg Oral BID WC  . cholecalciferol  2,000 Units Oral Daily  . enoxaparin (LOVENOX) injection  40 mg Subcutaneous Q24H  . insulin aspart  0-5 Units Subcutaneous QHS  . insulin aspart  0-9 Units Subcutaneous TID WC  . ipratropium-albuterol  3 mL Nebulization Q6H  . lisinopril  10 mg Oral Daily  . pantoprazole  40 mg Oral Q lunch  . pregabalin  150 mg Oral Daily   And  . pregabalin  300 mg Oral Daily  . rosuvastatin  20 mg Oral Daily   Continuous Infusions: . sodium chloride Stopped (04/01/19 0129)  . azithromycin    . cefTRIAXone (ROCEPHIN)  IV Stopped (04/01/19 0159)  . sodium chloride Stopped (04/01/19 0047)     LOS: 1 day        Aline August, MD [facility] 04/01/2019, 10:04 AM

## 2019-04-01 NOTE — Progress Notes (Addendum)
Pt presented to the floor alert and oriented times 4. Pt is an bilateral AKA. Pt was educated about the floor and the call bell system. Call bell and phone was place in his reach. Pt stated that he understands he needs to call for any needs. Pt has no skin issues however a foam was placed on his sacrum to prevent skin break down. Pt stated he would like for his admission questions to be asked and completed by his wife.

## 2019-04-01 NOTE — Patient Outreach (Signed)
[facility]) Care Management  04/01/2019  GIOVONNIE SEIGER 08/22/1940 [id number]   Noted that member was admitted to hospital yesterday for CAP.  Hospital liaisons notified, will follow up pending discharge.  [name], [place], MSN [place] [phone]

## 2019-04-01 NOTE — Evaluation (Signed)
Clinical/Bedside Swallow Evaluation Patient Details  Name: Jaime [name] MRN: [id number] Date of Birth: 05/15/1941  Today's Date: 04/01/2019 Time: SLP Start Time (ACUTE ONLY): 1115 SLP Stop Time (ACUTE ONLY): 1130 SLP Time Calculation (min) (ACUTE ONLY): 15 min  Past Medical History:  Past Medical History:  Diagnosis Date  . Atherosclerotic PVD with ulceration ([place])    left foot  . Bacteremia   . CAD (coronary artery disease) 2007   Dr Meda Coffee -  non-STEMI, .occluded circumflex.. Taxus stent placed...residual 80% LAD...50% RCA  . Cardiomyopathy, ischemic    EF 45% per ECHO 2008  //   EF 25%, echo, August, 2013 //  Echo (8/15):  Mild LVH, EF 30-35%, ant-lat and lat AK, inf HK, Gr 1 DD, mild MR, mild LAE  . Carotid artery disease ([place])    a.  Doppler, February, 2012, 0-39% bilateral,Mild smooth plaque;  b.  Carotid US (8/15):  Bilateral 1-39% ICA >>> F/u 2 years  . Constipation   . Dementia ([place])   . Diabetes mellitus    INSULIN-DEPENDent  . Fatigue SEVERE  . H/O pleural effusion 2008   POST THORACENTESIS  . History of colon polyps PRECANCEROUS  . Hyperlipidemia   . Hypertension   . Impotence   . Increased prostate specific antigen (PSA) velocity   . Insomnia   . Lung nodule    resolved 11-2006 CT Chest  . Multiple sclerosis ([place]) [place] -- LAST VISIT 11-20-2010  NOTE W/ CHART  . PAC (premature atrial contraction)    December, 2013  . Pulmonary hypertension ([place])    moderate ECHO Jan 2008  . Scoliosis associated with other condition   . Second degree Mobitz I AV block 11/12/2018  . Sleep apnea   . Symptomatic bradycardia 11/13/2018  . Systolic heart failure   . TIA (transient ischemic attack)   . Tobacco abuse    quit   . Urinary retention    dx ~ 2-12, like from [place], now with a catheter, saw urology  . Urinary tract infection    hx of   Past Surgical History:  Past Surgical History:  Procedure Laterality Date  . ABDOMINAL ANGIOGRAM   12/17/2011   Procedure: ABDOMINAL ANGIOGRAM;  Surgeon: Serafina Mitchell, MD;  Location: [facility] CATH LAB;  Service: Cardiovascular;;  . ABDOMINAL AORTAGRAM N/A 08/19/2013   Procedure: ABDOMINAL Maxcine Ham;  Surgeon: Conrad Coalport, MD;  Location: [facility] CATH LAB;  Service: Cardiovascular;  Laterality: N/A;  . AMPUTATION  03/17/2012   Procedure: AMPUTATION ABOVE KNEE;  Surgeon: Conrad Mills, MD;  Location: [place];  Service: Vascular;  Laterality: Left;  . AMPUTATION  06/03/2012   Procedure: AMPUTATION ABOVE KNEE;  Surgeon: Conrad , MD;  Location: [place];  Service: Vascular;  Laterality: Right;  . BIV PACEMAKER INSERTION CRT-P N/A 11/12/2018   Procedure: BIV PACEMAKER INSERTION CRT-P;  Surgeon: Evans Lance, MD;  Location: [place] CV LAB;  Service: Cardiovascular;  Laterality: N/A;  . CHOLECYSTECTOMY N/A 10/25/2014   Procedure: LAPAROSCOPIC CHOLECYSTECTOMY ;  Surgeon: Coralie Keens, MD;  Location: [place];  Service: General;  Laterality: N/A;  . CORONARY ANGIOPLASTY WITH STENT PLACEMENT  05-01-2006   OCCLUDED CIRCUMFLEX -- TAXUS STENT PLACMENT  AND RESIDUAL 80% LAD,  50% RCA  . CYSTOSCOPY  07/30/2011   Procedure: CYSTOSCOPY;  Surgeon: Hanley Ben, MD;  [facility];  Service: Urology;  Laterality: N/A;  . ERCP N/A 08/25/2014   Procedure:  ENDOSCOPIC RETROGRADE CHOLANGIOPANCREATOGRAPHY (ERCP);  Surgeon: Ladene Artist, MD;  Location: [facility] ENDOSCOPY;  Service: Endoscopy;  Laterality: N/A;  . FEMORAL-POPLITEAL BYPASS GRAFT  03/11/2012   Procedure: BYPASS GRAFT FEMORAL-POPLITEAL ARTERY;  Surgeon: Conrad [place] Ch[place], MD;  Location: [place];  Service: Vascular;  Laterality: Left;  embolectomy left lower leg  . FEMORAL-TIBIAL BYPASS GRAFT  03/11/2012   Procedure: BYPASS GRAFT FEMORAL-TIBIAL ARTERY;  Surgeon: Conrad Mendon, MD;  Location: [facility] OR;  Service: Vascular;  Laterality: Left;  Left Femoral -Tibial trunk bypass, Endarterectomy of Tibial- Peroneal trunk with vein angioplasty.  [facility] HERNIA REPAIR   1990   (R)  . INTRAOPERATIVE ARTERIOGRAM  03/11/2012   Procedure: INTRA OPERATIVE ARTERIOGRAM;  Surgeon: Conrad Topanga, MD;  Location: [place];  Service: Vascular;  Laterality: Left;  . LOWER EXTREMITY ANGIOGRAM Bilateral 12/17/2011   Procedure: LOWER EXTREMITY ANGIOGRAM;  Surgeon: Serafina Mitchell, MD;  Location: [facility] CATH LAB;  Service: Cardiovascular;  Laterality: Bilateral;  bil lower extrem angio  . THORACENTESIS  2008   PLEURAL EFFUSION  . TRANSURETHRAL RESECTION OF PROSTATE  07/30/2011   Procedure: TRANSURETHRAL RESECTION OF THE PROSTATE (TURP);  Surgeon: Hanley Ben, MD;  Location: [facility];  Service: Urology;  Laterality: N/A;   HPI:  Pt is a 78 year old male admitted with weakness, fever, and cough; found to have UTI and CXR concerning for possible RLL PNA. PMH includes: DM, CAD, MC with spastic paraplegia, scoliosis, OSA, prior bilateral AKA. Pt had MBS in 2017 that was [facility].   Assessment / Plan / Recommendation Clinical Impression  Pt's oropharyngeal swallow appears to be [facility] as can be assessed clinically. He denies any prior h/o PNA. SLP reviewed aspiration precautions with emphasis on appropriate positioning to reduce the risk of episodic aspiration. Do not feel like he needs any diet modifications or further testing at this time, although given his possible RLL PNA, additional f/u while in the hospital would be warranted. SLP Visit Diagnosis: Dysphagia, unspecified (R13.10)    Aspiration Risk  Mild aspiration risk    Diet Recommendation Regular;Thin liquid   Liquid Administration via: Cup;Straw Medication Administration: Whole meds with liquid Supervision: Patient able to self feed;Intermittent supervision to cue for compensatory strategies Compensations: Slow rate;Small sips/bites Postural Changes: Seated upright at 90 degrees    Other  Recommendations Oral Care Recommendations: Oral care BID   Follow up Recommendations None      Frequency and Duration  min 1 x/week  1 week       Prognosis Prognosis for Safe Diet Advancement: Good      Swallow Study   General HPI: Pt is a 78 year old male admitted with weakness, fever, and cough; found to have UTI and CXR concerning for possible RLL PNA. PMH includes: DM, CAD, MC with spastic paraplegia, scoliosis, OSA, prior bilateral AKA. Pt had MBS in 2017 that was [facility]. Type of Study: Bedside Swallow Evaluation Previous Swallow Assessment: see HPI Diet Prior to this Study: Regular;Thin liquids Temperature Spikes Noted: Yes(102.3) Respiratory Status: Room air History of Recent Intubation: No Behavior/Cognition: Alert;Cooperative;Pleasant mood Oral Cavity Assessment: Within Functional Limits Oral Care Completed by SLP: No Oral Cavity - Dentition: Dentures, top;Dentures, bottom Vision: Functional for self-feeding Self-Feeding Abilities: Able to feed self Patient Positioning: Postural control adequate for testing Baseline Vocal Quality: Normal Volitional Swallow: Able to elicit    Oral/Motor/Sensory Function Overall Oral Motor/Sensory Function: Within functional limits   Ice Chips Ice chips: Not tested   Thin Liquid Thin Liquid: Within functional  limits Presentation: Self Fed;Cup;Straw    Nectar Thick Nectar Thick Liquid: Not tested   Honey Thick Honey Thick Liquid: Not tested   Puree Puree: Within functional limits Presentation: Self Fed;Spoon   Solid     Solid: Within functional limits Presentation: [place] 04/01/2019,11:42 AM  Pollyann Glen, M.A. [place] Acute Environmental education officer [phone] Office [phone]

## 2019-04-01 NOTE — Consult Note (Addendum)
   [facility] Inpatient Consult   04/01/2019  Jaime [name] 1941-04-05 [id number]  Patient is currently active with [facility] for chronic disease management services.  Patient has been engaged by a [place].   Our community based plan of care has focused on disease management and community resource support.     Addendum 04/02/2019 1140 am:  Patient is active with [facility] prior to admission and has personal care services through the [place] and some privately paid.  Updated inpatient [facility] team member.  Chart review reveals from History and Physical MD notes as follows:  Jaime [name]  is a 78 y.o. 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    Plan:  Following for progress and noted for a Palliative Consult as well.  Alerted Inpatient [facility] team member to make aware that [place] [facility] following.   Of note, [facility] Care Management services does not replace or interfere with any services that are needed or arranged by inpatient [facility] care management team.  For additional questions or referrals please contact:  Natividad Brood, RN BSN [facility] Liaison  [phone] business mobile phone Toll free office [phone]  Fax number: [phone] [email or web address]

## 2019-04-02 DIAGNOSIS — Z7401 Bed confinement status: Secondary | ICD-10-CM

## 2019-04-02 LAB — LEGIONELLA PNEUMOPHILA SEROGP 1 UR AG: L. pneumophila Serogp 1 Ur Ag: NEGATIVE

## 2019-04-02 LAB — GLUCOSE, CAPILLARY
Glucose-Capillary: 78 mg/dL (ref 70–99)
Glucose-Capillary: 185 mg/dL — ABNORMAL HIGH (ref 70–99)

## 2019-04-02 MED ORDER — AZITHROMYCIN 500 MG PO TABS: 500.0000 mg | ORAL_TABLET | Freq: Every day | ORAL | Status: DC

## 2019-04-02 MED ORDER — CEFUROXIME AXETIL 500 MG PO TABS: 500.0000 mg | ORAL_TABLET | Freq: Two times a day (BID) | ORAL | 0 refills | Status: DC

## 2019-04-02 MED ORDER — IPRATROPIUM-ALBUTEROL 0.5-2.5 (3) MG/3ML IN SOLN: 3.0000 mL | Freq: Four times a day (QID) | RESPIRATORY_TRACT | Status: DC | PRN

## 2019-04-02 MED ORDER — TRAMADOL HCL 50 MG PO TABS: 50.0000 mg | ORAL_TABLET | Freq: Four times a day (QID) | ORAL | 0 refills | Status: DC | PRN

## 2019-04-02 NOTE — Discharge Summary (Signed)
Physician Discharge Summary  Jaime [name] [id number] DOB: 10/28/1940 DOA: 03/31/2019  PCP: Jaime Rail, MD  Admit date: 03/31/2019 Discharge date: 04/02/2019  Admitted From: Home Disposition: Home  Recommendations for Outpatient Follow-up:  1. Follow up with PCP in 1 week with repeat CBC/BMP 2. Follow up in ED if symptoms worsen or new appear 3. 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  he was started on antibiotics for pneumonia.  During the hospitalization, his condition is improved.  He is afebrile, on room air.  He will be discharged on oral antibiotics.  Discharge Diagnoses:   Community-acquired bacterial pneumonia Hypoxia -Currently on Rocephin and Zithromax.    Blood cultures negative so far.  urine streptococcal antigen negative. - Initially required 2 L oxygen.  Currently on room air. -Status post with recommendations -COVID-19 testing negative. -Feels much better.  Will discharge home on oral Ceftin for 5 more days.  Klebsiella pneumonia UTI -Currently on Rocephin.  Antibiotic plan as above.  Multiple sclerosis, progressive -Patient is bedbound.  Continue baclofen for spasticity.  Outpatient follow-up.   History of pacemaker placement for Mobitz 1 block -Outpatient follow-up with cardiology/EP  Dementia -Outpatient follow-up.  recommend outpatient follow-up by palliative care.  Patient is currently full code.  Diabetes mellitus type II -Continue home regimen.  Carb modified diet.  Hypertension - Continue Coreg and lisinopril.   Discharge Instructions  Discharge  Instructions    Diet - low sodium heart healthy   Complete by: As directed    Diet Carb Modified   Complete by: As directed    Increase activity slowly   Complete by: As directed      Allergies as of 04/02/2019      Reactions   Bee Venom Anaphylaxis   Influenza Vaccines Other (See Comments)   Made patient "sick for months"   Atorvastatin Other (See Comments)   Severe pain in stumps   Latex Rash      Medication List    TAKE these medications   albuterol 108 (90 Base) MCG/ACT inhaler Commonly known as: VENTOLIN HFA Inhale 2 puffs into the lungs every 6 (six) hours as needed for wheezing or shortness of breath.   amoxicillin 500 MG tablet Commonly known as: AMOXIL Take 4 tablets (2,000 mg) 30 minutes prior to dental procedure   aspirin EC 81 MG tablet Take 1 tablet (81 mg total) by mouth daily.   azelastine 0.1 % nasal spray Commonly known as: ASTELIN Place 2 sprays into both nostrils at bedtime as needed for rhinitis. Use in each nostril as directed   baclofen 10 MG tablet Commonly known as: LIORESAL Take 0.5 tablets (5 mg total) by mouth 2 (two) times daily.   carvedilol 3.125 MG tablet Commonly known as: Coreg Take 1 tablet (3.125 mg total) by mouth 2 (two) times daily with a meal.   cefUROXime 500 MG tablet Commonly known as: CEFTIN Take 1 tablet (500 mg total) by mouth 2 (two) times daily for 5 days.   cetirizine 10 MG tablet Commonly known as: ZYRTEC Take 10 mg by mouth daily.   diclofenac sodium 1 % Gel Commonly known as: VOLTAREN Apply 1 application topically 2 (two) times daily as  needed (for pain).   furosemide 40 MG tablet Commonly known as: LASIX Take 40 mg by mouth daily.   GlucaGen 1 MG Solr injection Generic drug: glucagon Inject 1 mg into the muscle once as needed for low blood sugar. Reported on 11/16/2015   guaiFENesin 100 MG/5ML Soln Commonly known as: ROBITUSSIN Take 5 mLs by mouth every 4 (four) hours as needed for cough or to loosen  phlegm.   insulin aspart protamine - aspart (70-30) 100 UNIT/ML FlexPen Commonly known as: NOVOLOG 70/30 MIX Inject 0.05 mLs (5 Units total) into the skin 2 (two) times daily.   lisinopril 10 MG tablet Commonly known as: ZESTRIL TAKE 1 TABLET BY MOUTH EVERY DAY   mupirocin ointment 2 % Commonly known as: BACTROBAN Apply 1 application topically as needed (rash on back).   pantoprazole 40 MG tablet Commonly known as: PROTONIX Take 1 tablet (40 mg total) by mouth daily with lunch.   pregabalin 150 MG capsule Commonly known as: LYRICA TAKE ONE CAPSULE BY MOUTH EVERY MORNING AND 2 CAPSULES EVERY NIGHT AT BEDTIME What changed: See the new instructions.   rosuvastatin 20 MG tablet Commonly known as: CRESTOR TAKE 1 TABLET(20 MG) BY MOUTH DAILY What changed: See the new instructions.   SYSTANE BALANCE OP Place 1-2 drops into both eyes daily as needed (dry eyes).   traMADol 50 MG tablet Commonly known as: ULTRAM Take 1 tablet (50 mg total) by mouth every 6 (six) hours as needed (for pain). What changed: when to take this   traZODone 100 MG tablet Commonly known as: DESYREL Take 100 mg by mouth at bedtime.   Vitamin D-3 25 MCG (1000 UT) Caps Take 2,000 Units by mouth daily.      Follow-up Information    Jaime Rail, MD. Schedule an appointment as soon as possible for a visit in 1 week(s).   Specialty: Internal Medicine Contact information: [place] [address] [phone]        Jaime Spark, MD .   Specialty: Cardiology Contact information: [place] [phone] [phone]          Allergies  Allergen Reactions  . Bee Venom Anaphylaxis  . Influenza Vaccines Other (See Comments)    Made patient "sick for months"  . Atorvastatin Other (See Comments)    Severe pain in stumps  . Latex Rash    Consultations:  Palliative care consultation is pending   Procedures/Studies: Dg Chest Portable 1  View  Result Date: 03/31/2019 CLINICAL DATA:  Fever.  Altered mental status. EXAM: PORTABLE CHEST 1 VIEW COMPARISON:  11/16/2018 FINDINGS: Heart size and pulmonary vascularity are normal. Pacemaker in place. Slight haziness at the right lung base could represent an early infiltrate. Slight linear scarring in the left midzone, unchanged. No effusions. No acute bone abnormality. IMPRESSION: Slight haziness at the right lung base. This could represent an early infiltrate. Electronically Signed   By: Lorriane Shire M.D.   On: 03/31/2019 10:05     Subjective: Patient seen and examined at bedside.  He is pleasantly confused.  No overnight fever or vomiting reported. Feels that he is gotten better and wants to go home.  Discharge Exam: Vitals:   04/02/19 0556 04/02/19 0816  BP: (!) 158/77   Pulse: 61   Resp: 19   Temp: 98.5 F (36.9 C)   SpO2: 100% 96%    General exam: Appears calm and comfortable.  Pleasantly confused. Respiratory system: Bilateral  decreased breath sounds at bases with some scattered crackles Cardiovascular system: S1 & S2 heard, Rate controlled Gastrointestinal system: Abdomen is nondistended, soft and nontender. Normal bowel sounds heard. Extremities: No cyanosis, clubbing; bilateral AKA present   The results of significant diagnostics from this hospitalization (including imaging, microbiology, ancillary and laboratory) are listed below for reference.     Microbiology: Recent Results (from the past 240 hour(s))  Urine culture     Status: Abnormal (Preliminary result)   Collection Time: 03/31/19  9:43 AM   Specimen: Urine, Random  Result Value Ref Range Status   Specimen Description URINE, RANDOM  Final   Special Requests NONE  Final   Culture (A)  Final    >=100,000 COLONIES/mL KLEBSIELLA PNEUMONIAE SUSCEPTIBILITIES TO FOLLOW Performed at [facility], [address], [place], [place] [address]    Report Status PENDING  Incomplete  SARS Coronavirus 2  [facility] order, Performed in [facility] hospital lab) Nasopharyngeal Nasopharyngeal Swab     Status: None   Collection Time: 03/31/19  9:57 AM   Specimen: Nasopharyngeal Swab  Result Value Ref Range Status   SARS Coronavirus 2 NEGATIVE NEGATIVE Final    Comment: (NOTE) If result is NEGATIVE SARS-CoV-2 target nucleic acids are NOT DETECTED. The SARS-CoV-2 RNA is generally detectable in upper and lower  respiratory specimens during the acute phase of infection. The lowest  concentration of SARS-CoV-2 viral copies this assay can detect is 250  copies / mL. A negative result does not preclude SARS-CoV-2 infection  and should not be used as the sole basis for treatment or other  patient management decisions.  A negative result may occur with  improper specimen collection / handling, submission of specimen other  than nasopharyngeal swab, presence of viral mutation(s) within the  areas targeted by this assay, and inadequate number of viral copies  (<250 copies / mL). A negative result must be combined with clinical  observations, patient history, and epidemiological information. If result is POSITIVE SARS-CoV-2 target nucleic acids are DETECTED. The SARS-CoV-2 RNA is generally detectable in upper and lower  respiratory specimens dur ing the acute phase of infection.  Positive  results are indicative of active infection with SARS-CoV-2.  Clinical  correlation with patient history and other diagnostic information is  necessary to determine patient infection status.  Positive results do  not rule out bacterial infection or co-infection with other viruses. If result is PRESUMPTIVE POSTIVE SARS-CoV-2 nucleic acids MAY BE PRESENT.   A presumptive positive result was obtained on the submitted specimen  and confirmed on repeat testing.  While 2019 novel coronavirus  (SARS-CoV-2) nucleic acids may be present in the submitted sample  additional confirmatory testing may be necessary for  epidemiological  and / or clinical management purposes  to differentiate between  SARS-CoV-2 and other Sarbecovirus currently known to infect humans.  If clinically indicated additional testing with an alternate test  methodology [phone]) is advised. The SARS-CoV-2 RNA is generally  detectable in upper and lower respiratory sp ecimens during the acute  phase of infection. The expected result is Negative. Fact Sheet for Patients:  StrictlyIdeas.no Fact Sheet for Healthcare Providers: BankingDealers.co.za This test is not yet approved or cleared by the [place] FDA and has been authorized for detection and/or diagnosis of SARS-CoV-2 by FDA under an Emergency Use Authorization (EUA).  This EUA will remain in effect (meaning this test can be used) for the duration of the COVID-19 declaration under Section 564(b)(1) of the Act, 21  U.S.C. section 360bbb-3(b)(1), unless the authorization is terminated or revoked sooner. Performed at [facility], [place] [address] [place], [place] [address]   MRSA PCR Screening     Status: Abnormal   Collection Time: 04/01/19  3:15 PM   Specimen: Nasal Mucosa; Nasopharyngeal  Result Value Ref Range Status   MRSA by PCR POSITIVE (A) NEGATIVE Final    Comment:        The GeneXpert MRSA Assay (FDA approved for NASAL specimens only), is one component of a comprehensive MRSA colonization surveillance program. It is not intended to diagnose MRSA infection nor to guide or monitor treatment for MRSA infections. RESULT CALLED TO, READ BACK BY AND VERIFIED WITH: Mohammed Kindle RN 19:20 04/01/19 ([email or web address]) Performed at [name], [place] [address], [place], [place] [address]      Labs: BNP (last 3 results) Recent Labs    11/16/18 0920  BNP [id number]*   Basic Metabolic Panel: Recent Labs  Lab 03/31/19 1050  NA 137  K 4.7  CL 105  CO2 24  GLUCOSE 172*  BUN 23  CREATININE 0.79  CALCIUM 8.8*    Liver Function Tests: Recent Labs  Lab 03/31/19 1050  AST 31  ALT 23  ALKPHOS 53  BILITOT 0.9  PROT 7.7  ALBUMIN 3.5   Recent Labs  Lab 03/31/19 1050  LIPASE 31   No results for input(s): AMMONIA in the last 168 hours. CBC: Recent Labs  Lab 03/31/19 1050  WBC 9.3  NEUTROABS 7.9*  HGB 13.3  HCT 42.9  MCV 82.8  PLT 150   Cardiac Enzymes: No results for input(s): CKTOTAL, CKMB, CKMBINDEX, TROPONINI in the last 168 hours. BNP: Invalid input(s): POCBNP CBG: Recent Labs  Lab 04/01/19 0814 04/01/19 1232 04/01/19 1656 04/01/19 2150 04/02/19 0802  GLUCAP 94 221* 93 138* 78   D-Dimer No results for input(s): DDIMER in the last 72 hours. Hgb A1c No results for input(s): HGBA1C in the last 72 hours. Lipid Profile No results for input(s): CHOL, HDL, LDLCALC, TRIG, CHOLHDL, LDLDIRECT in the last 72 hours. Thyroid function studies No results for input(s): TSH, T4TOTAL, T3FREE, THYROIDAB in the last 72 hours.  Invalid input(s): FREET3 Anemia work up No results for input(s): VITAMINB12, FOLATE, FERRITIN, TIBC, IRON, RETICCTPCT in the last 72 hours. Urinalysis    Component Value Date/Time   COLORURINE YELLOW 03/31/2019 1220   APPEARANCEUR CLOUDY (A) 03/31/2019 1220   LABSPEC 1.010 03/31/2019 1220   PHURINE 5.0 03/31/2019 1220   GLUCOSEU NEGATIVE 03/31/2019 1220   GLUCOSEU NEGATIVE 04/29/2017 1211   HGBUR SMALL (A) 03/31/2019 1220   BILIRUBINUR NEGATIVE 03/31/2019 1220   KETONESUR NEGATIVE 03/31/2019 1220   PROTEINUR NEGATIVE 03/31/2019 1220   UROBILINOGEN 0.2 04/29/2017 1211   NITRITE NEGATIVE 03/31/2019 1220   LEUKOCYTESUR LARGE (A) 03/31/2019 1220   Sepsis Labs Invalid input(s): PROCALCITONIN,  WBC,  LACTICIDVEN Microbiology Recent Results (from the past 240 hour(s))  Urine culture     Status: Abnormal (Preliminary result)   Collection Time: 03/31/19  9:43 AM   Specimen: Urine, Random  Result Value Ref Range Status   Specimen Description URINE,  RANDOM  Final   Special Requests NONE  Final   Culture (A)  Final    >=100,000 COLONIES/mL KLEBSIELLA PNEUMONIAE SUSCEPTIBILITIES TO FOLLOW Performed at [facility], [place] [address], [place], [place] [address]    Report Status PENDING  Incomplete  SARS Coronavirus 2 [facility] order, Performed in [facility] hospital lab) Nasopharyngeal Nasopharyngeal  Swab     Status: None   Collection Time: 03/31/19  9:57 AM   Specimen: Nasopharyngeal Swab  Result Value Ref Range Status   SARS Coronavirus 2 NEGATIVE NEGATIVE Final    Comment: (NOTE) If result is NEGATIVE SARS-CoV-2 target nucleic acids are NOT DETECTED. The SARS-CoV-2 RNA is generally detectable in upper and lower  respiratory specimens during the acute phase of infection. The lowest  concentration of SARS-CoV-2 viral copies this assay can detect is 250  copies / mL. A negative result does not preclude SARS-CoV-2 infection  and should not be used as the sole basis for treatment or other  patient management decisions.  A negative result may occur with  improper specimen collection / handling, submission of specimen other  than nasopharyngeal swab, presence of viral mutation(s) within the  areas targeted by this assay, and inadequate number of viral copies  (<250 copies / mL). A negative result must be combined with clinical  observations, patient history, and epidemiological information. If result is POSITIVE SARS-CoV-2 target nucleic acids are DETECTED. The SARS-CoV-2 RNA is generally detectable in upper and lower  respiratory specimens dur ing the acute phase of infection.  Positive  results are indicative of active infection with SARS-CoV-2.  Clinical  correlation with patient history and other diagnostic information is  necessary to determine patient infection status.  Positive results do  not rule out bacterial infection or co-infection with other viruses. If result is PRESUMPTIVE POSTIVE SARS-CoV-2 nucleic acids MAY BE  PRESENT.   A presumptive positive result was obtained on the submitted specimen  and confirmed on repeat testing.  While 2019 novel coronavirus  (SARS-CoV-2) nucleic acids may be present in the submitted sample  additional confirmatory testing may be necessary for epidemiological  and / or clinical management purposes  to differentiate between  SARS-CoV-2 and other Sarbecovirus currently known to infect humans.  If clinically indicated additional testing with an alternate test  methodology [phone]) is advised. The SARS-CoV-2 RNA is generally  detectable in upper and lower respiratory sp ecimens during the acute  phase of infection. The expected result is Negative. Fact Sheet for Patients:  StrictlyIdeas.no Fact Sheet for Healthcare Providers: BankingDealers.co.za This test is not yet approved or cleared by the [place] FDA and has been authorized for detection and/or diagnosis of SARS-CoV-2 by FDA under an Emergency Use Authorization (EUA).  This EUA will remain in effect (meaning this test can be used) for the duration of the COVID-19 declaration under Section 564(b)(1) of the Act, 21 U.S.C. section 360bbb-3(b)(1), unless the authorization is terminated or revoked sooner. Performed at [facility], [place] [address], [place], [place] [address]   MRSA PCR Screening     Status: Abnormal   Collection Time: 04/01/19  3:15 PM   Specimen: Nasal Mucosa; Nasopharyngeal  Result Value Ref Range Status   MRSA by PCR POSITIVE (A) NEGATIVE Final    Comment:        The GeneXpert MRSA Assay (FDA approved for NASAL specimens only), is one component of a comprehensive MRSA colonization surveillance program. It is not intended to diagnose MRSA infection nor to guide or monitor treatment for MRSA infections. RESULT CALLED TO, READ BACK BY AND VERIFIED WITH: Mohammed Kindle RN 19:20 04/01/19 ([email or web address]) Performed at [facility], [place]  [address] [place], [place] [address]      Time coordinating discharge: 35 minutes  SIGNED:   Aline August, MD  [facility] 04/02/2019, 10:05 AM

## 2019-04-02 NOTE — Progress Notes (Signed)
Jaime [name] to be discharged Home per MD order. Discussed prescriptions and follow up appointments with the patient. Prescriptions given to patient; medication list explained in detail. Patient verbalized understanding.   Skin clean, dry and intact without evidence of skin break down, no evidence of skin tears noted. IV catheter discontinued intact. Site without signs and symptoms of complications. Dressing and pressure applied. Pt denies pain at the site currently. No complaints noted.  Patient free of lines, drains, and wounds.  An After Visit Summary (AVS) was printed and given to the patient. Patient escorted via wheelchair, and discharged home via private auto.  Cathlean Marseilles Tamala Julian, MSN, RN, [place], AGCNS

## 2019-04-02 NOTE — Plan of Care (Signed)
  Problem: Education: Goal: Knowledge of General Education information will improve Description: Including pain rating scale, medication(s)/side effects and non-pharmacologic comfort measures Outcome: Completed/Met   Problem: Pain Managment: Goal: General experience of comfort will improve Outcome: Completed/Met   Problem: Safety: Goal: Ability to remain free from injury will improve Outcome: Completed/Met   Problem: Skin Integrity: Goal: Risk for impaired skin integrity will decrease Outcome: Completed/Met

## 2019-04-02 NOTE — Care Management Important Message (Signed)
Important Message  Patient Details  Name: Jaime [name] MRN: [id number] Date of Birth: 1941/07/12   Medicare Important Message Given:  Yes     Orbie Pyo 04/02/2019, 2:45 PM

## 2019-04-03 LAB — URINE CULTURE: Culture: >=100000 — AB

## 2019-04-05 ENCOUNTER — Telehealth: Payer: Self-pay | Admitting: *Deleted

## 2019-04-05 ENCOUNTER — Other Ambulatory Visit: Payer: Self-pay | Admitting: *Deleted

## 2019-04-05 LAB — CULTURE, BLOOD (ROUTINE X 2): Culture: NO GROWTH

## 2019-04-05 NOTE — Telephone Encounter (Signed)
Rec'd msg off patiet ping portal stating on 03/31/19 1:25 PM admitted to [facility] inpatient from Physician Office. Pt was D/C  04/02/19 @ 3:39 PM from [facility] inpatient to Home. Will call pt/wife to follow-up and make hosp f/u appt if need.Marland KitchenJohny Chess

## 2019-04-05 NOTE — Patient Outreach (Signed)
[facility] [place] [facility]) Care Management  04/05/2019  Jaime [name] [birth year] [id number]   Member discharged home after being admitted with diagnosis of pneumonia.  She report she is doing her best to care for him and keep him in the home.  He is not taking antibiotics for the pneumonia, but she report she feels he is holding to fluid as he still appears to be swollen.  She does state he is breathing better.  No follow up appointment made yet, but will call once this conversation is complete.  This care manager discussed concern of wife being able to continue to adequately care for member in the home.  She state this is also her concern as it is becoming harder for her to care for him but feel he is not "mentally ready."  State he is still oriented some days.  Again discussed the safety of both the situation as she now has a bad back and medical conditions herself.  Verbalizes understanding and state her children have also discussed this with her.  She will continue to keep member in the home at this time but will reconsider placement.  Denies any urgent concerns at this time, will follow up within the next 2 weeks.  [facility] CM Care Plan Problem One     Most Recent Value  Care Plan Problem One  Risk for readmission as evidenced by recent hospitalization  Role Documenting the Problem One  Care Management Martin for Problem One  Active  [facility] Long Term Goal   Member will not be readmitted to hospital within the nect 31 days  [facility] Long Term Goal Start Date  04/05/19  Interventions for Problem One Long Term Goal  Discharge instructions reviwed with wife.  Discussed concern regarding ability to manage member's care independently at home  Advised to consider alternate options.  [facility] CM Short Term Goal #1   Member will report contact with [facility] rep within the next 4 weeks  [facility] CM Short Term Goal #1 Start Date  02/24/19  [facility] CM Short Term Goal #1 Met Date  -- [Not met]  [facility] CM Short  Term Goal #2   Member will have follow up appointment scheduled with primary MD within the next 2 weeks  [facility] CM Short Term Goal #2 Start Date  04/05/19  Interventions for Short Term Goal #2  Advised wife of importance of having follow up appointment.  Advised to contact primary MD office to schedule tele visit as member unable to get to office.     [name], [place], MSN [place] [phone]

## 2019-04-05 NOTE — Telephone Encounter (Signed)
Called pt/wife to conform telephone visit that's been made for 04/07/19 w/[name]. There was no answer LMOM RTC.Marland KitchenJohny [name]

## 2019-04-06 ENCOUNTER — Other Ambulatory Visit: Payer: Self-pay | Admitting: *Deleted

## 2019-04-06 NOTE — Patient Outreach (Signed)
[place] [facility]) Care Management  04/06/2019  Jaime [name] 1941/05/21 [id number]   CSW called & spoke with patient's wife, Jaime [name] to follow-up on patient's discharge from the hospital on 9/11 with Pneumonia & UTI. Patient's wife reports that patient gets a bath everyday with the aide that comes 2 hours a day/5 days a week. Jaime [name] also shared that patient has been doing well since returning home from the hospital and since [facility] last spoke with them, patient has been assigned Darlin Priestly as the caseworker at the [employer] they are working on getting her a power lift as patient is a bilateral amputee as well as making plans for eventual move to the [facility] home. Patient is active with [facility] as well. Patient's wife states no further CSW needs at this time but CSW encouraged her to call if anything came up. CSW will close case.    Raynaldo Opitz, LCSW [facility]  Clinical Social Worker cell #: [phone]

## 2019-04-06 NOTE — Progress Notes (Signed)
Virtual Visit via Telephone Note  I connected with Jaime [name] on 04/07/19 at 10:00 AM EDT by telephone and verified that I am speaking with the correct person using two identifiers.   I discussed the limitations of evaluation and management by telemedicine and the availability of in person appointments. The patient expressed understanding and agreed to proceed.  The patient is currently at home and I am in the office.  His wife is with him and she provides the history.   No referring provider.    History of Present Illness: This visit is for follow up from the hospital      Admitted 03/31/19-04/02/19 for pneumonia and UTI.   He was sent to the ED for 1 week of generalized weakness, productive cough and fever.  His family also stated altered mental status.  Work-up in the emergency room showed that he had right-sided early pneumonia and UTI.  WBC count was 9.3.  He was hypoxic and started on oxygen.  Cultures were taken.  He was started on vancomycin and downgraded to Rocephin and Zithromax.  He improved and was discharged to home.   Community acquired bacterial PNA, hypoxia: Received rocephin and zithromax in hospital - d/c'd on ceftin x 5 more days Initially on oxygen - improved and weaned off oxygen Urine strept antigen negative COVID-19 negative  Klebsiella pneumonia UTI On rocephin in hospital  Multiple sclerosis, progressive bedbound On baclofen for spasticity  Mobitz 1 block, s/p PPM Follows with cardiology, EP  Dementia: Currently full code Consider palliative care   Diabetes, type 2 Stable Continued on home regimen  Hypertension Stable On coreg, lisinopril   He is doing well overall and has no complaints.  His wife feels his doing well.  His appetite has improved.  There is no signs of infection. His mental status in back to normal.  He does not have a cough, sob or wheeze.  His urine looks normal and there is no odor.  He completed the antibiotic. His mood  is good and he is sleeping well.  His chronic medical problems have been controlled.   Review of Systems  Constitutional: Negative for chills and fever.       Appetite improved, energy normal  Respiratory: Negative for cough, shortness of breath and wheezing.   Cardiovascular: Negative for chest pain, palpitations and leg swelling.  Genitourinary: Negative for dysuria and hematuria.       No urine odor, urine is clean  Skin:       No pressure ulcers  Neurological: Negative for dizziness and headaches.      Social History   Socioeconomic History  . Marital status: Married    Spouse name: [name]  . Number of children: 2  . Years of education: Not on file  . Highest education level: Not on file  Occupational History  . Occupation: disable    Employer: RETIRED  Social Needs  . Financial resource strain: Not hard at all  . Food insecurity    Worry: Never true    Inability: Never true  . Transportation needs    Medical: No    Non-medical: No  Tobacco Use  . Smoking status: Former Smoker    Years: 50.00    Quit date: 07/22/2012    Years since quitting: 6.7  . Smokeless tobacco: Current User  . Tobacco comment: pt states that he is using E-cigs  Substance and Sexual Activity  . Alcohol use: No    Alcohol/week: 0.0 standard  drinks  . Drug use: No  . Sexual activity: Not Currently    Comment: electronic cigarettes no nicotene  Lifestyle  . Physical activity    Days per week: 0 days    Minutes per session: 0 min  . Stress: Not on file  Relationships  . Social connections    Talks on phone: More than three times a week    Gets together: Three times a week    Attends religious service: 1 to 4 times per year    Active member of club or organization: Yes    Attends meetings of clubs or organizations: 1 to 4 times per year    Relationship status: Married  Other Topics Concern  . Not on file  Social History Narrative   Lives w/ wife      Observations/Objective: This morning his vitals:  145/82  Pulse 64,  Temp 98.6,  Sugar 135   Lab Results  Component Value Date   WBC 9.3 03/31/2019   HGB 13.3 03/31/2019   HCT 42.9 03/31/2019   PLT 150 03/31/2019   GLUCOSE 172 (H) 03/31/2019   CHOL 110 11/25/2017   TRIG 68.0 11/25/2017   HDL 32.60 (L) 11/25/2017   LDLDIRECT 123.5 03/17/2008   LDLCALC 64 11/25/2017   ALT 23 03/31/2019   AST 31 03/31/2019   NA 137 03/31/2019   K 4.7 03/31/2019   CL 105 03/31/2019   CREATININE 0.79 03/31/2019   BUN 23 03/31/2019   CO2 24 03/31/2019   TSH 0.581 11/16/2018   PSA 2.31 04/04/2010   INR 1.27 07/01/2016   HGBA1C 6.9 (H) 11/17/2018   MICROALBUR 3.6 (H) 07/31/2007    DG Chest Portable 1 View CLINICAL DATA:  Fever.  Altered mental status.  EXAM: PORTABLE CHEST 1 VIEW  COMPARISON:  11/16/2018  FINDINGS: Heart size and pulmonary vascularity are normal. Pacemaker in place. Slight haziness at the right lung base could represent an early infiltrate. Slight linear scarring in the left midzone, unchanged. No effusions.  No acute bone abnormality.  IMPRESSION: Slight haziness at the right lung base. This could represent an early infiltrate.  Electronically Signed   By: Lorriane Shire M.D.   On: 03/31/2019 10:05    Assessment and Plan:  See Problem List for Assessment and Plan of chronic medical problems.   Follow Up Instructions:    I discussed the assessment and treatment plan with the patient. The patient was provided an opportunity to ask questions and all were answered. The patient agreed with the plan and demonstrated an understanding of the instructions.   The patient was advised to call back or seek an in-person evaluation if the symptoms worsen or if the condition fails to improve as anticipated.  Time spent on telephone call - 15 min             [name], MD

## 2019-04-07 ENCOUNTER — Ambulatory Visit (INDEPENDENT_AMBULATORY_CARE_PROVIDER_SITE_OTHER): Payer: Medicare Other | Admitting: Internal Medicine

## 2019-04-07 ENCOUNTER — Encounter: Payer: Self-pay | Admitting: Internal Medicine

## 2019-04-07 DIAGNOSIS — Z794 Long term (current) use of insulin: Secondary | ICD-10-CM

## 2019-04-07 DIAGNOSIS — Z7902 Long term (current) use of antithrombotics/antiplatelets: Secondary | ICD-10-CM | POA: Diagnosis not present

## 2019-04-07 DIAGNOSIS — B961 Klebsiella pneumoniae [K. pneumoniae] as the cause of diseases classified elsewhere: Secondary | ICD-10-CM

## 2019-04-07 DIAGNOSIS — I509 Heart failure, unspecified: Secondary | ICD-10-CM | POA: Diagnosis not present

## 2019-04-07 DIAGNOSIS — I10 Essential (primary) hypertension: Secondary | ICD-10-CM

## 2019-04-07 DIAGNOSIS — E7849 Other hyperlipidemia: Secondary | ICD-10-CM

## 2019-04-07 DIAGNOSIS — N390 Urinary tract infection, site not specified: Secondary | ICD-10-CM

## 2019-04-07 DIAGNOSIS — J189 Pneumonia, unspecified organism: Secondary | ICD-10-CM | POA: Diagnosis not present

## 2019-04-07 DIAGNOSIS — G35 Multiple sclerosis: Secondary | ICD-10-CM

## 2019-04-07 DIAGNOSIS — E1165 Type 2 diabetes mellitus with hyperglycemia: Secondary | ICD-10-CM | POA: Diagnosis not present

## 2019-04-07 DIAGNOSIS — E1159 Type 2 diabetes mellitus with other circulatory complications: Secondary | ICD-10-CM

## 2019-04-07 DIAGNOSIS — G3184 Mild cognitive impairment, so stated: Secondary | ICD-10-CM

## 2019-04-07 DIAGNOSIS — B9689 Other specified bacterial agents as the cause of diseases classified elsewhere: Secondary | ICD-10-CM

## 2019-04-07 NOTE — Assessment & Plan Note (Signed)
Blood pressure well controlled at home Continue current medications

## 2019-04-07 NOTE — Assessment & Plan Note (Signed)
Stable. Following with neurology

## 2019-04-07 NOTE — Assessment & Plan Note (Signed)
Admitted with hypoxia and infiltrate on chest x-ray Completed antibiotics No concerning symptoms since being at home No further evaluation or treatment needed

## 2019-04-07 NOTE — Assessment & Plan Note (Signed)
Stable  Lab Results  Component Value Date   HGBA1C 6.9 (H) 11/17/2018   Continue current insulin regimen

## 2019-04-07 NOTE — Assessment & Plan Note (Signed)
Stable.  Continue statin. 

## 2019-04-07 NOTE — Assessment & Plan Note (Signed)
Completed antibiotic No concerning urinary symptoms His wife will monitor his urine and let us know if there are any concerning symptoms for UTI

## 2019-04-07 NOTE — Assessment & Plan Note (Signed)
Stable, mental status back to normal after treatment of 2 infections

## 2019-04-08 ENCOUNTER — Other Ambulatory Visit: Payer: Self-pay

## 2019-04-08 ENCOUNTER — Other Ambulatory Visit: Payer: TRICARE For Life (TFL) | Admitting: Hospice

## 2019-04-08 DIAGNOSIS — Z515 Encounter for palliative care: Secondary | ICD-10-CM

## 2019-04-08 NOTE — Progress Notes (Signed)
[facility] Consult Note Telephone: [phone]  Fax: [phone]   PATIENT NAME: Jaime [name] DOB: 1941/07/14 MRN: [id number]  PRIMARY CARE PROVIDER:   [name], MD, [place] [address] [phone]  REFERRING PROVIDER:  [name], MD [place],  [place] [address] [phone]  RESPONSIBLE PARTY:   Nekoda Goldwire, wife [phone]      RECOMMENDATIONS and PLAN:  1. Advance Care Planning: Patient's goals include to maximize quality of life and symptom management.  MOST form up to date, uploaded in EPIC; Full Code status.  Only wants CPR done once and if unsuccessful then STOP.  Wants full hospital interventions, antibiotics and IV fluids.  Wants feeding tube for trial period.   2. Memory loss r/t Dementia;   FAST 6d.  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  He is transferred with lift.  He is unable to sit up without support in his wheelchair. Ongoing supportive care and emotional support.    He was hospitalized 03/31/2019 to 04/02/2019 for pneumonia. He completed antibiotic Cefuroxime today.    Follow up Palliative Care Visit: Palliative care will continue to follow for goals of care clarification and symptom management.   I spent 40 minutes providing this consultation,  from 10:30  to 11.10am. More than 50% of the time in this consultation was spent coordinating communication, reviewing advance careplan and goals of care.    HISTORY OF PRESENT ILLNESS:  Jaime [name] is a 78 y.o. year old male with multiple medical problems including Dementia, DMT2, CAD, CHF, MS, has pacemaker placement. Palliative Care was asked to help address goals of care. CODE STATUS: FULL  PPS: 30% HOSPICE ELIGIBILITY/DIAGNOSIS: TBD   Past Medical History:  Diagnosis Date  . Atherosclerotic PVD with ulceration ([place])    left foot  . Bacteremia   . CAD (coronary artery disease) 2007   Dr Meda Coffee -   non-STEMI, .occluded circumflex.. Taxus stent placed...residual 80% LAD...50% RCA  . Cardiomyopathy, ischemic    EF 45% per ECHO 2008  //   EF 25%, echo, August, 2013 //  Echo (8/15):  Mild LVH, EF 30-35%, ant-lat and lat AK, inf HK, Gr 1 DD, mild MR, mild LAE  . Carotid artery disease ([place])    a.  Doppler, February, 2012, 0-39% bilateral,Mild smooth plaque;  b.  Carotid US (8/15):  Bilateral 1-39% ICA >>> F/u 2 years  . Constipation   . Dementia ([place])   . Diabetes mellitus    INSULIN-DEPENDent  . Fatigue SEVERE  . H/O pleural effusion 2008   POST THORACENTESIS  . History of colon polyps PRECANCEROUS  . Hyperlipidemia   . Hypertension   . Impotence   . Increased prostate specific antigen (PSA) velocity   . Insomnia   . Lung nodule    resolved 11-2006 CT Chest  . Multiple sclerosis ([place]) [place] -- LAST VISIT 11-20-2010  NOTE W/ CHART  . PAC (premature atrial contraction)    December, 2013  . Pulmonary hypertension ([place])    moderate ECHO Jan 2008  . Scoliosis associated with other condition   . Second degree Mobitz I AV block 11/12/2018  . Sleep apnea   . Symptomatic bradycardia 11/13/2018  . Systolic heart failure   . TIA (transient ischemic attack)   . Tobacco abuse    quit   . Urinary retention    dx ~  2-12, like from [place], now with a catheter, saw urology  . Urinary tract infection    hx of    SOCIAL HX:  Social History   Tobacco Use  . Smoking status: Former Smoker    Years: 50.00    Quit date: 07/22/2012    Years since quitting: 6.7  . Smokeless tobacco: Current User  . Tobacco comment: pt states that he is using E-cigs  Substance Use Topics  . Alcohol use: No    Alcohol/week: 0.0 standard drinks    ALLERGIES:  Allergies  Allergen Reactions  . Bee Venom Anaphylaxis  . Influenza Vaccines Other (See Comments)    Made patient "sick for months"  . Atorvastatin Other (See Comments)    Severe pain in stumps  . Latex Rash      PERTINENT MEDICATIONS:  Outpatient Encounter Medications as of 04/08/2019  Medication Sig  . albuterol (PROVENTIL HFA;VENTOLIN HFA) 108 (90 Base) MCG/ACT inhaler Inhale 2 puffs into the lungs every 6 (six) hours as needed for wheezing or shortness of breath.  Marland Kitchen amoxicillin (AMOXIL) 500 MG tablet Take 4 tablets (2,000 mg) 30 minutes prior to dental procedure (Patient not taking: Reported on 03/31/2019)  . aspirin EC 81 MG tablet Take 1 tablet (81 mg total) by mouth daily.  . baclofen (LIORESAL) 10 MG tablet Take 0.5 tablets (5 mg total) by mouth 2 (two) times daily.  . carvedilol (COREG) 3.125 MG tablet Take 1 tablet (3.125 mg total) by mouth 2 (two) times daily with a meal.  . cetirizine (ZYRTEC) 10 MG tablet Take 10 mg by mouth daily.  . Cholecalciferol (VITAMIN D-3) 1000 units CAPS Take 2,000 Units by mouth daily.  . diclofenac sodium (VOLTAREN) 1 % GEL Apply 1 application topically 2 (two) times daily as needed (for pain).   . furosemide (LASIX) 40 MG tablet Take 40 mg by mouth daily.  Marland Kitchen glucagon (GLUCAGEN) 1 MG SOLR injection Inject 1 mg into the muscle once as needed for low blood sugar. Reported on 11/16/2015  . guaiFENesin (ROBITUSSIN) 100 MG/5ML SOLN Take 5 mLs by mouth every 4 (four) hours as needed for cough or to loosen phlegm.  . insulin aspart protamine - aspart (NOVOLOG 70/30 MIX) (70-30) 100 UNIT/ML FlexPen Inject 0.05 mLs (5 Units total) into the skin 2 (two) times daily.  Marland Kitchen lisinopril (PRINIVIL,ZESTRIL) 10 MG tablet TAKE 1 TABLET BY MOUTH EVERY DAY (Patient taking differently: Take 10 mg by mouth daily. )  . mupirocin ointment (BACTROBAN) 2 % Apply 1 application topically as needed (rash on back).   . pantoprazole (PROTONIX) 40 MG tablet Take 1 tablet (40 mg total) by mouth daily with lunch.  . pregabalin (LYRICA) 150 MG capsule TAKE ONE CAPSULE BY MOUTH EVERY MORNING AND 2 CAPSULES EVERY NIGHT AT BEDTIME (Patient taking differently: Take 150 mg by mouth See admin instructions. Tale  150 mg in the morning and 300 mg in the evening)  . Propylene Glycol (SYSTANE BALANCE OP) Place 1-2 drops into both eyes daily as needed (dry eyes).   . rosuvastatin (CRESTOR) 20 MG tablet TAKE 1 TABLET(20 MG) BY MOUTH DAILY (Patient taking differently: Take 20 mg by mouth daily. )  . traMADol (ULTRAM) 50 MG tablet Take 1 tablet (50 mg total) by mouth every 6 (six) hours as needed (for pain).  . traZODone (DESYREL) 100 MG tablet Take 100 mg by mouth at bedtime.    No facility-administered encounter medications on file as of 04/08/2019.     PHYSICAL EXAM:  General: patient lying in bed in NAD Cardiovascular: regular rate and rhythm Pulmonary: lung sounds clear; normal respiratory effort, no SOB or cough Abdomen: soft, nontender, + bowel sounds GU: no suprapubic tenderness;  Patient does have foley catheter with clear medium yellow urine noted in bag and tubing  Teodoro Spray, NP

## 2019-04-16 ENCOUNTER — Other Ambulatory Visit: Payer: Self-pay | Admitting: *Deleted

## 2019-04-16 NOTE — Patient Outreach (Signed)
[facility]) Care Management  04/16/2019  Jaime [name] [birth year] [id number]   Call placed to member's caregiver/wife to follow up on management of care, no answer.  HIPAA compliant voice message left.  Will follow up within the next 3-4 business days.  [name], [place], MSN [name] [phone]

## 2019-04-21 ENCOUNTER — Other Ambulatory Visit: Payer: Self-pay | Admitting: *Deleted

## 2019-04-21 NOTE — Patient Outreach (Signed)
[facility]) Care Management  04/21/2019  Jaime [name] Feb 19, 1941 [id number]   Unsuccessful outreach #2.  Call placed to member's wife to follow up on current status, no answer.  HIPAA compliant voice message left, unsuccessful outreach left.  Will follow up within the next 3-4 business days.  [name], [place], MSN [place] [phone]

## 2019-04-26 ENCOUNTER — Other Ambulatory Visit: Payer: Self-pay | Admitting: *Deleted

## 2019-04-26 DIAGNOSIS — E119 Type 2 diabetes mellitus without complications: Secondary | ICD-10-CM | POA: Diagnosis not present

## 2019-04-26 DIAGNOSIS — Z794 Long term (current) use of insulin: Secondary | ICD-10-CM | POA: Diagnosis not present

## 2019-04-26 DIAGNOSIS — H25813 Combined forms of age-related cataract, bilateral: Secondary | ICD-10-CM | POA: Diagnosis not present

## 2019-04-26 NOTE — Patient Outreach (Signed)
[facility]) Care Management  04/26/2019  ZABIEN CUTHBERTSON Feb 22, 1941 [id number]   Unsuccessful outreach #3.  Call placed to member's wife to follow up on current status, no answer.  HIPAA compliant voice message left.  If no call back by 10/9, will close case due to inability to maintain contact.  [name], [place], MSN [place] [phone]

## 2019-04-27 ENCOUNTER — Other Ambulatory Visit: Payer: Self-pay | Admitting: *Deleted

## 2019-04-27 NOTE — Patient Outreach (Signed)
[facility] ([facility]) Care Management  04/27/2019  Jaime [name] 10/22/1940 [id number]   Call received back from member's wife Jaime [name] after missed outreach yesterday.  She state member is stable however she have been very busy with trying to manage member's condition as well as her daughter's who just got out of the hospital.  Report she now realizes that she will not be able to be the full time caregiver of both her daughter and member as it is taking a toll on her on health.  Member [facility] with in [facility] services as well as private duty aides.  Wife report she found and completed the paperwork for the [facility] facility where the member would like to be placed.  She will call to meet with the [facility] representative this week.  Dr. Burns remains member's primary MD, but member will continue to receive visits from Dr. Asenso (who will report back to Dr. Burns) until he is placed as it is very difficult to get member out of the home.  [facility] remains active for palliative care.  Wife denies any urgent concerns at this time, will follow up within the next month and consider transition to health coach versus case closure.  [facility] CM Care Plan Problem One     Most Recent Value  Care Plan Problem One  Risk for readmission as evidenced by recent hospitalization  Role Documenting the Problem One  Care Management Coordinator  Care Plan for Problem One  Not Active  [facility] Long Term Goal   Member will not be readmitted to hospital within the nect 31 days  [facility] Long Term Goal Start Date  04/05/19  [facility] Long Term Goal Met Date  04/27/19  [facility] CM Short Term Goal #2   Member will have follow up appointment scheduled with primary MD within the next 2 weeks  [facility] CM Short Term Goal #2 Start Date  04/05/19  [facility] CM Short Term Goal #2 Met Date  04/27/19       Lane, RN, MSN [facility] Care Management  Community Care Manager [phone]  

## 2019-05-05 DIAGNOSIS — G894 Chronic pain syndrome: Secondary | ICD-10-CM | POA: Diagnosis not present

## 2019-05-05 DIAGNOSIS — I509 Heart failure, unspecified: Secondary | ICD-10-CM | POA: Diagnosis not present

## 2019-05-05 DIAGNOSIS — I10 Essential (primary) hypertension: Secondary | ICD-10-CM | POA: Diagnosis not present

## 2019-05-05 DIAGNOSIS — E1165 Type 2 diabetes mellitus with hyperglycemia: Secondary | ICD-10-CM | POA: Diagnosis not present

## 2019-05-11 ENCOUNTER — Ambulatory Visit: Payer: Self-pay | Admitting: *Deleted

## 2019-05-12 ENCOUNTER — Ambulatory Visit (INDEPENDENT_AMBULATORY_CARE_PROVIDER_SITE_OTHER): Payer: Medicare Other | Admitting: *Deleted

## 2019-05-12 DIAGNOSIS — I255 Ischemic cardiomyopathy: Secondary | ICD-10-CM

## 2019-05-12 DIAGNOSIS — I442 Atrioventricular block, complete: Secondary | ICD-10-CM | POA: Diagnosis not present

## 2019-05-12 LAB — CUP PACEART REMOTE DEVICE CHECK
Battery Remaining Longevity: 74 mo
Battery Remaining Percentage: 95.5 %
Battery Voltage: 2.99 V
Brady Statistic AP VP Percent: 84 %
Brady Statistic AP VS Percent: 1 %
Brady Statistic AS VP Percent: 16 %
Brady Statistic AS VS Percent: 1 %
Brady Statistic RA Percent Paced: 83 %
Date Time Interrogation Session: 20201021060008
Implantable Lead Implant Date: 20200423
Implantable Lead Implant Date: 20200423
Implantable Lead Implant Date: 20200423
Implantable Lead Location: 753858
Implantable Lead Location: 753859
Implantable Lead Location: 753860
Implantable Pulse Generator Implant Date: 20200423
Lead Channel Impedance Value: 350 Ohm
Lead Channel Impedance Value: 530 Ohm
Lead Channel Impedance Value: 530 Ohm
Lead Channel Pacing Threshold Amplitude: 0.5 V
Lead Channel Pacing Threshold Amplitude: 0.625 V
Lead Channel Pacing Threshold Amplitude: 1.75 V
Lead Channel Pacing Threshold Pulse Width: 0.5 ms
Lead Channel Pacing Threshold Pulse Width: 0.5 ms
Lead Channel Pacing Threshold Pulse Width: 0.5 ms
Lead Channel Sensing Intrinsic Amplitude: 12 mV
Lead Channel Sensing Intrinsic Amplitude: 4 mV
Lead Channel Setting Pacing Amplitude: 1.625
Lead Channel Setting Pacing Amplitude: 2 V
Lead Channel Setting Pacing Amplitude: 2.75 V
Lead Channel Setting Pacing Pulse Width: 0.5 ms
Lead Channel Setting Pacing Pulse Width: 0.5 ms
Lead Channel Setting Sensing Sensitivity: 2 mV
Pulse Gen Serial Number: 9116598

## 2019-05-21 ENCOUNTER — Emergency Department (HOSPITAL_COMMUNITY): Payer: Medicare Other

## 2019-05-21 ENCOUNTER — Emergency Department (HOSPITAL_COMMUNITY)
Admission: EM | Admit: 2019-05-21 | Discharge: 2019-05-21 | Disposition: A | Discharge status: Home / Self Care | Payer: Medicare Other | Attending: Emergency Medicine | Admitting: Emergency Medicine

## 2019-05-21 ENCOUNTER — Other Ambulatory Visit: Payer: Self-pay

## 2019-05-21 ENCOUNTER — Encounter (HOSPITAL_COMMUNITY): Payer: Self-pay

## 2019-05-21 DIAGNOSIS — F17200 Nicotine dependence, unspecified, uncomplicated: Secondary | ICD-10-CM | POA: Diagnosis not present

## 2019-05-21 DIAGNOSIS — Z89611 Acquired absence of right leg above knee: Secondary | ICD-10-CM | POA: Diagnosis not present

## 2019-05-21 DIAGNOSIS — R0602 Shortness of breath: Secondary | ICD-10-CM | POA: Diagnosis not present

## 2019-05-21 DIAGNOSIS — R0902 Hypoxemia: Secondary | ICD-10-CM | POA: Diagnosis not present

## 2019-05-21 DIAGNOSIS — R531 Weakness: Secondary | ICD-10-CM | POA: Diagnosis not present

## 2019-05-21 DIAGNOSIS — I2510 Atherosclerotic heart disease of native coronary artery without angina pectoris: Secondary | ICD-10-CM | POA: Insufficient documentation

## 2019-05-21 DIAGNOSIS — Z91040 Latex allergy status: Secondary | ICD-10-CM | POA: Insufficient documentation

## 2019-05-21 DIAGNOSIS — I10 Essential (primary) hypertension: Secondary | ICD-10-CM | POA: Diagnosis not present

## 2019-05-21 DIAGNOSIS — G35 Multiple sclerosis: Secondary | ICD-10-CM | POA: Insufficient documentation

## 2019-05-21 DIAGNOSIS — Z89612 Acquired absence of left leg above knee: Secondary | ICD-10-CM | POA: Diagnosis not present

## 2019-05-21 DIAGNOSIS — Z794 Long term (current) use of insulin: Secondary | ICD-10-CM | POA: Insufficient documentation

## 2019-05-21 DIAGNOSIS — Z7982 Long term (current) use of aspirin: Secondary | ICD-10-CM | POA: Diagnosis not present

## 2019-05-21 DIAGNOSIS — Z79899 Other long term (current) drug therapy: Secondary | ICD-10-CM | POA: Insufficient documentation

## 2019-05-21 DIAGNOSIS — Z950 Presence of cardiac pacemaker: Secondary | ICD-10-CM | POA: Diagnosis not present

## 2019-05-21 DIAGNOSIS — E119 Type 2 diabetes mellitus without complications: Secondary | ICD-10-CM | POA: Diagnosis not present

## 2019-05-21 DIAGNOSIS — I429 Cardiomyopathy, unspecified: Secondary | ICD-10-CM | POA: Diagnosis not present

## 2019-05-21 LAB — URINALYSIS, ROUTINE W REFLEX MICROSCOPIC
Bilirubin Urine: NEGATIVE
Glucose, UA: NEGATIVE mg/dL
Hgb urine dipstick: NEGATIVE
Ketones, ur: NEGATIVE mg/dL
Leukocytes,Ua: NEGATIVE
Nitrite: NEGATIVE
Protein, ur: NEGATIVE mg/dL
Specific Gravity, Urine: 1.016 (ref 1.005–1.030)
pH: 5 (ref 5.0–8.0)

## 2019-05-21 LAB — CBC WITH DIFFERENTIAL/PLATELET
Abs Immature Granulocytes: 0.01 10*3/uL (ref 0.00–0.07)
Basophils Absolute: 0 10*3/uL (ref 0.0–0.1)
Basophils Relative: 0 %
Eosinophils Absolute: 0.1 10*3/uL (ref 0.0–0.5)
Eosinophils Relative: 1 %
HCT: 37.1 % — ABNORMAL LOW (ref 39.0–52.0)
Hemoglobin: 11.8 g/dL — ABNORMAL LOW (ref 13.0–17.0)
Immature Granulocytes: 0 %
Lymphocytes Relative: 21 %
Lymphs Abs: 1.1 10*3/uL (ref 0.7–4.0)
MCH: 26.6 pg (ref 26.0–34.0)
MCHC: 31.8 g/dL (ref 30.0–36.0)
MCV: 83.6 fL (ref 80.0–100.0)
Monocytes Absolute: 0.8 10*3/uL (ref 0.1–1.0)
Monocytes Relative: 16 %
Neutro Abs: 3.1 10*3/uL (ref 1.7–7.7)
Neutrophils Relative %: 62 %
Platelets: 132 10*3/uL — ABNORMAL LOW (ref 150–400)
RBC: 4.44 MIL/uL (ref 4.22–5.81)
RDW: 17.5 % — ABNORMAL HIGH (ref 11.5–15.5)
WBC: 5 10*3/uL (ref 4.0–10.5)
nRBC: 0 % (ref 0.0–0.2)

## 2019-05-21 LAB — BASIC METABOLIC PANEL
Anion gap: 11 (ref 5–15)
BUN: 34 mg/dL — ABNORMAL HIGH (ref 8–23)
CO2: 23 mmol/L (ref 22–32)
Calcium: 8.9 mg/dL (ref 8.9–10.3)
Chloride: 108 mmol/L (ref 98–111)
Creatinine, Ser: 0.9 mg/dL (ref 0.61–1.24)
GFR calc Af Amer: >60 mL/min (ref >60)
GFR calc non Af Amer: >60 mL/min (ref >60)
Glucose, Bld: 117 mg/dL — ABNORMAL HIGH (ref 70–99)
Potassium: 3.9 mmol/L (ref 3.5–5.1)
Sodium: 142 mmol/L (ref 135–145)

## 2019-05-21 LAB — BRAIN NATRIURETIC PEPTIDE: B Natriuretic Peptide: 100.5 pg/mL — ABNORMAL HIGH (ref 0.0–100.0)

## 2019-05-21 LAB — TROPONIN I (HIGH SENSITIVITY)
Troponin I (High Sensitivity): 22 ng/L — ABNORMAL HIGH (ref <18)
Troponin I (High Sensitivity): 18 ng/L — ABNORMAL HIGH (ref <18)

## 2019-05-21 NOTE — ED Notes (Signed)
Pt verbalizes understanding of discharge instructions and follow-up care Opportunity for questions and answers provided.

## 2019-05-21 NOTE — ED Provider Notes (Signed)
[facility] EMERGENCY DEPARTMENT Provider Note   CSN: [id number] Arrival date & time: 05/21/19  1558     History   Chief Complaint Chief Complaint  Patient presents with  . Shortness of Breath  . Weakness    HPI Jaime [name] is a 78 y.o. male with a history of multiple sclerosis, cardiomyopathy, coronary artery disease, presenting to emergency department with weakness and shortness of breath.  The patient himself has no acute complaints on exam and states that he feels "fine".  However his wife at the bedside who is his primary caretaker reports the patient has been weaker than normal.  He has a history of bilateral lower leg amputations and thus needs assistance around the house, the wife states that she and the home health aide of been having a very hard time getting the patient to do his daily activities.   His wife feels that he has had labored breathing at home and feels short of breath.  She reports that he has had a productive cough and was treated in September for pneumonia.  She states she does not think that he ever improved after his treatment for pneumonia.  She also reports that he has had some twitching in his hands at different times complained about weakness.  She does state that in the past his multiple sclerosis flares tend to present with diffuse body weakness along with confusion and altered mental status, however she feels that these symptoms are different.  She states he has not had an MS flare in many years, although she cannot recall the last flare.  He is not on chronic steroids.  He wears a condom catheter to help with urination at home.Marland Kitchen     HPI  Past Medical History:  Diagnosis Date  . Atherosclerotic PVD with ulceration ([place])    left foot  . Bacteremia   . CAD (coronary artery disease) 2007   Dr Meda Coffee -  non-STEMI, .occluded circumflex.. Taxus stent placed...residual 80% LAD...50% RCA  . Cardiomyopathy, ischemic    EF 45% per  ECHO 2008  //   EF 25%, echo, August, 2013 //  Echo (8/15):  Mild LVH, EF 30-35%, ant-lat and lat AK, inf HK, Gr 1 DD, mild MR, mild LAE  . Carotid artery disease ([place])    a.  Doppler, February, 2012, 0-39% bilateral,Mild smooth plaque;  b.  Carotid US (8/15):  Bilateral 1-39% ICA >>> F/u 2 years  . Constipation   . Dementia ([place])   . Diabetes mellitus    INSULIN-DEPENDent  . Fatigue SEVERE  . H/O pleural effusion 2008   POST THORACENTESIS  . History of colon polyps PRECANCEROUS  . Hyperlipidemia   . Hypertension   . Impotence   . Increased prostate specific antigen (PSA) velocity   . Insomnia   . Lung nodule    resolved 11-2006 CT Chest  . Multiple sclerosis ([place]) [place] -- LAST VISIT 11-20-2010  NOTE W/ CHART  . PAC (premature atrial contraction)    December, 2013  . Pulmonary hypertension ([place])    moderate ECHO Jan 2008  . Scoliosis associated with other condition   . Second degree Mobitz I AV block 11/12/2018  . Sleep apnea   . Symptomatic bradycardia 11/13/2018  . Systolic heart failure   . TIA (transient ischemic attack)   . Tobacco abuse    quit   . Urinary retention    dx ~ 2-12, like from  MS, now with a catheter, saw urology  . Urinary tract infection    hx of    Patient Active Problem List   Diagnosis Date Noted  . Community acquired pneumonia 03/31/2019  . Heart block AV complete ([place]) 02/12/2019  . Biventricular cardiac pacemaker in situ 02/12/2019  . Acute metabolic encephalopathy [id number]  . 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 11/16/2018  . Second degree Mobitz I AV block 11/12/2018  . Abnormal liver function   . UTI due to Klebsiella species 11/24/2017  . Hypoglycemia   . GERD (gastroesophageal reflux disease) 04/10/2016  . Coronary artery disease involving native coronary artery of native heart without angina pectoris 12/28/2015  . PAD (peripheral artery disease) ([place]) 12/28/2015  . Symptomatic  bradycardia 08/14/2015  . Chronic systolic heart failure ([place]) 08/14/2015  . Phantom limb pain ([place]) 12/08/2014  . Syncope 11/01/2014  . 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 Paraparesis of both lower limbs ([place]) 05/31/2014  . Scoliosis, thoracogenic, acquired 03/15/2014  . Mild cognitive impairment 02/11/2014  . PVD (peripheral vascular disease) ([place]) 08/13/2013  . 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 PAC (premature atrial contraction)   . Peripheral vascular disease, unspecified ([place]) 07/03/2012  . 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 Ejection fraction   . Cardiomyopathy, ischemic   . Pulmonary hypertension ([place])   . Hemiparesis ([place])   . Essential hypertension   . Carotid artery disease ([place])   . Hyperlipidemia 07/31/2007  . BPH (benign prostatic hyperplasia) 04/15/2007  . DMII (diabetes mellitus, type 2) ([place]) 12/18/2006    Past Surgical History:  Procedure Laterality Date  . ABDOMINAL ANGIOGRAM  12/17/2011   Procedure: ABDOMINAL ANGIOGRAM;  Surgeon: Serafina Mitchell, MD;  Location[facility] C[facility] LAB;  Service: Cardiovascular;;  . ABDOMINAL AORTAGRAM N/A 08/19/2013   Procedure: ABDOMINAL Maxcine Ham;  Surgeon: Conrad Union, MD;  Location: [facility] CATH LAB;  Service: Cardiovascular;  Laterality: N/A;  . AMPUTATION  03/17/2012   Procedure: AMPUTATION ABOVE KNEE;  Surgeon: Conrad Thornwood, MD;  Location: [place];  Service: Vascular;  Laterality: Left;  . AMPUTATION  06/03/2012   Procedure: AMPUTATION ABOVE KNEE;  Surgeon: Conrad Verona, MD;  Location: [place];  Service: Vascular;  Laterality: Right;  . BIV PACEMAKER INSERTION CRT-P N/A 11/12/2018   Procedure: BIV PACEMAKER INSERTION CRT-P;  Surgeon: Evans Lance, MD;  Location: [place] CV LAB;  Service: Cardiovascular;  Laterality: N/A;  . CHOLECYSTECTOMY N/A 10/25/2014   Procedure: LAPAROSCOPIC CHOLECYSTECTOMY ;  Surgeon: Coralie Keens, MD;  Location: [place];   Service: General;  Laterality: N/A;  . CORONARY ANGIOPLASTY WITH STENT PLACEMENT  05-01-2006   OCCLUDED CIRCUMFLEX -- TAXUS STENT PLACMENT  AND RESIDUAL 80% LAD,  50% RCA  . CYSTOSCOPY  07/30/2011   Procedure: CYSTOSCOPY;  Surgeon: Hanley Ben, MD;  Location: [facility];  Service: Urology;  Laterality: N/A;  . ERCP N/A 08/25/2014   Procedure: ENDOSCOPIC RETROGRADE CHOLANGIOPANCREATOGRAPHY (ERCP);  Surgeon: Ladene Artist, MD;  Location: [facility] ENDOSCOPY;  Service: Endoscopy;  Laterality: N/A;  . FEMORAL-POPLITEAL BYPASS GRAFT  03/11/2012   Procedure: BYPASS GRAFT FEMORAL-POPLITEAL ARTERY;  Surgeon: Conrad Co[place], MD;  Location: [place];  Service: Vascular;  Laterality: Left;  embolectomy left lower leg  . FEMORAL-TIBIAL BYPASS GRAFT  03/11/2012   Procedure: BYPASS GRAFT FEMORAL-TIBIAL ARTERY;  Surgeon: Conrad Hingham, MD;  Location: [place];  Service: Vascular;  Laterality: Left;  Left Femoral -  Tibial trunk bypass, Endarterectomy of Tibial- Peroneal trunk with vein angioplasty.  Marland Kitchen HERNIA REPAIR  1990   (R)  . INTRAOPERATIVE ARTERIOGRAM  03/11/2012   Procedure: INTRA OPERATIVE ARTERIOGRAM;  Surgeon: Conrad [place], MD;  Location: [place];  Service: Vascular;  Laterality: Left;  . LOWER EXTREMITY ANGIOGRAM Bilateral 12/17/2011   Procedure: LOWER EXTREMITY ANGIOGRAM;  Surgeon: Serafina Mitchell, MD;  Location: [facility] CATH LAB;  Service: Cardiovascular;  Laterality: Bilateral;  bil lower extrem angio  . THORACENTESIS  2008   PLEURAL EFFUSION  . TRANSURETHRAL RESECTION OF PROSTATE  07/30/2011   Procedure: TRANSURETHRAL RESECTION OF THE PROSTATE (TURP);  Surgeon: Hanley Ben, MD;  Location: [facility];  Service: Urology;  Laterality: N/A;        Home Medications    Prior to Admission medications   Medication Sig Start Date End Date Taking? Authorizing Provider  albuterol (PROVENTIL HFA;VENTOLIN HFA) 108 (90 Base) MCG/ACT inhaler Inhale 2 puffs into the lungs every 6 (six) hours as  needed for wheezing or shortness of breath. 12/28/15   Dorothy Spark, MD  amoxicillin (AMOXIL) 500 MG tablet Take 4 tablets (2,000 mg) 30 minutes prior to dental procedure Patient not taking: Reported on 03/31/2019 12/28/18   Evans Lance, MD  aspirin EC 81 MG tablet Take 1 tablet (81 mg total) by mouth daily. 07/07/17   Dorothy Spark, MD  baclofen (LIORESAL) 10 MG tablet Take 0.5 tablets (5 mg total) by mouth 2 (two) times daily. 07/10/18   Pieter Partridge, DO  carvedilol (COREG) 3.125 MG tablet Take 1 tablet (3.125 mg total) by mouth 2 (two) times daily with a meal. 11/13/18   Baldwin Jamaica, PA-C  cetirizine (ZYRTEC) 10 MG tablet Take 10 mg by mouth daily. 10/19/18   [provider]  Cholecalciferol (VITAMIN D-3) 1000 units CAPS Take 2,000 Units by mouth daily.    [provider]  diclofenac sodium (VOLTAREN) 1 % GEL Apply 1 application topically 2 (two) times daily as needed (for pain).  03/09/12   Kirsteins, Luanna Salk, MD  furosemide (LASIX) 40 MG tablet Take 40 mg by mouth daily.    [provider]  glucagon (GLUCAGEN) 1 MG SOLR injection Inject 1 mg into the muscle once as needed for low blood sugar. Reported on 11/16/2015    [provider]  guaiFENesin (ROBITUSSIN) 100 MG/5ML SOLN Take 5 mLs by mouth every 4 (four) hours as needed for cough or to loosen phlegm.    [provider]  insulin aspart protamine - aspart (NOVOLOG 70/30 MIX) (70-30) 100 UNIT/ML FlexPen Inject 0.05 mLs (5 Units total) into the skin 2 (two) times daily. 03/12/17   [name], MD  lisinopril (PRINIVIL,ZESTRIL) 10 MG tablet TAKE 1 TABLET BY MOUTH EVERY DAY Patient taking differently: Take 10 mg by mouth daily.  05/26/17   [name], MD  mupirocin ointment (BACTROBAN) 2 % Apply 1 application topically as needed (rash on back).  02/16/19   [provider]  pantoprazole (PROTONIX) 40 MG tablet Take 1 tablet (40 mg total) by mouth daily with lunch. 12/22/17    Burns, Claudina Lick, MD  pregabalin (LYRICA) 150 MG capsule TAKE ONE CAPSULE BY MOUTH EVERY MORNING AND 2 CAPSULES EVERY NIGHT AT BEDTIME Patient taking differently: Take 150 mg by mouth See admin instructions. Tale 150 mg in the morning and 300 mg in the evening 02/02/19   Pieter Partridge, DO  Propylene Glycol (SYSTANE BALANCE OP)  Place 1-2 drops into both eyes daily as needed (dry eyes).     [provider]  rosuvastatin (CRESTOR) 20 MG tablet TAKE 1 TABLET(20 MG) BY MOUTH DAILY Patient taking differently: Take 20 mg by mouth daily.  04/13/18   [name], MD  traMADol (ULTRAM) 50 MG tablet Take 1 tablet (50 mg total) by mouth every 6 (six) hours as needed (for pain). 04/02/19   Aline August, MD  traZODone (DESYREL) 100 MG tablet Take 100 mg by mouth at bedtime.  11/09/18   [provider]    Family History Family History  Problem Relation Age of Onset  . Heart attack Mother 35  . Heart disease Mother   . Stroke Mother   . Hyperlipidemia Mother   . Hypertension Mother   . COPD Father   . Peripheral vascular disease Father   . Diabetes Brother   . Heart disease Brother   . Hypertension Brother   . Heart attack Brother   . Diabetes Daughter   . Colon cancer Neg Hx   . Prostate cancer Neg Hx     Social History Social History   Tobacco Use  . Smoking status: Former Smoker    Years: 50.00    Quit date: 07/22/2012    Years since quitting: 6.8  . Smokeless tobacco: Current User  . Tobacco comment: pt states that he is using E-cigs  Substance Use Topics  . Alcohol use: No    Alcohol/week: 0.0 standard drinks  . Drug use: No     Allergies   Bee venom, Influenza vaccines, Atorvastatin, and Latex   Review of Systems Review of Systems  Constitutional: Positive for fatigue. Negative for chills and fever.  Eyes: Negative for photophobia and visual disturbance.  Respiratory: Negative for cough and shortness of breath.   Cardiovascular: Negative for chest pain and  palpitations.  Gastrointestinal: Negative for abdominal pain, nausea and vomiting.  Skin: Negative for pallor and rash.  Neurological: Negative for syncope and light-headedness.  All other systems reviewed and are negative.    Physical Exam Updated Vital Signs BP 109/78   Pulse 60   Temp 98.7 F (37.1 C) (Oral)   Resp 11   SpO2 95%   Physical Exam Vitals signs and nursing note reviewed.  Constitutional:      Appearance: He is well-developed.  HENT:     Head: Normocephalic and atraumatic.  Eyes:     Conjunctiva/sclera: Conjunctivae normal.  Neck:     Musculoskeletal: Neck supple.  Cardiovascular:     Rate and Rhythm: Normal rate and regular rhythm.  Pulmonary:     Effort: Pulmonary effort is normal. No accessory muscle usage or respiratory distress.     Breath sounds: Normal breath sounds. No stridor.  Abdominal:     Palpations: Abdomen is soft.     Tenderness: There is no abdominal tenderness.     Comments: Condom foley catheter  Musculoskeletal:     Comments: Bilateral lower leg amputations  Skin:    General: Skin is warm and dry.  Neurological:     General: No focal deficit present.     Mental Status: He is alert and oriented to person, place, and time.      ED Treatments / Results  Labs (all labs ordered are listed, but only abnormal results are displayed) Labs Reviewed  BASIC METABOLIC PANEL - Abnormal; Notable for the following components:      Result Value   Glucose, Bld 117 (*)  BUN 34 (*)    All other components within normal limits  CBC WITH DIFFERENTIAL/PLATELET - Abnormal; Notable for the following components:   Hemoglobin 11.8 (*)    HCT 37.1 (*)    RDW 17.5 (*)    Platelets 132 (*)    All other components within normal limits  BRAIN NATRIURETIC PEPTIDE - Abnormal; Notable for the following components:   B Natriuretic Peptide 100.5 (*)    All other components within normal limits  TROPONIN I (HIGH SENSITIVITY) - Abnormal; Notable for the  following components:   Troponin I (High Sensitivity) 22 (*)    All other components within normal limits  TROPONIN I (HIGH SENSITIVITY) - Abnormal; Notable for the following components:   Troponin I (High Sensitivity) 18 (*)    All other components within normal limits  URINALYSIS, ROUTINE W REFLEX MICROSCOPIC    EKG EKG Interpretation  Date/Time:  Friday May 21 2019 16:10:44 EDT Ventricular Rate:  60 PR Interval:    QRS Duration: 182 QT Interval:  490 QTC Calculation: 490 R Axis:   -86 Text Interpretation: Short PR interval Nonspecific IVCD with LAD V paced rhythm No significant changes from Sept 2020 ecg Confirmed by Octaviano Glow [phone]) on 05/21/2019 4:34:35 PM   Radiology Dg Chest Port 1 View  Result Date: 05/21/2019 CLINICAL DATA:  Short of breath EXAM: PORTABLE CHEST 1 VIEW COMPARISON:  03/31/2019 FINDINGS: Emphysematous disease. Stable left-sided pacing device. No consolidation or effusion. Stable enlarged cardiomediastinal silhouette. IMPRESSION: No active disease.  Emphysematous disease and stable cardiomegaly Electronically Signed   By: Donavan Foil M.D.   On: 05/21/2019 18:23    Procedures Procedures (including critical care time)  Medications Ordered in ED Medications - No data to display   Initial Impression / Assessment and Plan / ED Course  I have reviewed the triage vital signs and the nursing notes.  Pertinent labs & imaging results that were available during my care of the patient were reviewed by me and considered in my medical decision making (see chart for details).  78 yo male presenting with weakness per wife's report Patient reporting he feels perfectly fine His wife believes he has labored breathing and appears tired to her  He is wheelchair bound and has bilateral BKA's  On exam he is wheel appearing, does not appear hypoxic or tachypneic to me Labs are pending Evaluate for signs of ACS, CHF, PNA Evaluate for electrolyte  abnormalities or anemia  Low suspicion for infection at this time   Clinical Course as of May 20 2337  Fri May 21, 2019  2208 Spoke to the patient and their wife regarding his work-up.  No acute changes to warrant admission at this time.  I believe would be reasonable to discharge him.  Both the patient and his wife are agreeable to this plan.  I advised him to follow-up with her neurologist Dr. [name] as soon as possible.  I explained I cannot definitively rule out multiple sclerosis at this time, but given his otherwise benign symptoms, I do not believe he needs hospitalization and urgent MRI or steroids at this time.  If he develops new symptoms, advised him to return to the ER.   [MT]    Clinical Course User Index [MT] Langston Masker Carola Rhine, MD     Final Clinical Impressions(s) / ED Diagnoses   Final diagnoses:  Weakness    ED Discharge Orders    None       Olive Motyka, Carola Rhine, MD 05/21/19  2354  

## 2019-05-21 NOTE — Discharge Instructions (Signed)
We did not find any signs of acute emergencies including heart failure, heart attack, or lung infections during Jaime [name] visit to the ED today.  We do not see signs of infection in his urine.  I felt it was reasonable to discharge him home.  As I explained, it is possible that his symptoms are related to his multiple sclerosis.  Please call Dr Georgie Chard office tomorrow (or Monday if they are closed) to discuss Jaime [name]'s ER visit.   Although his symptoms of weakness are relatively mild now, they may worsen or change over time.  I will leave it up to his neurologist to determine whether Jaime [name] needs to be on a course of steroids.  If you feel that Jaime [name]'s breathing is getting worse at home, or that he is feeling lightheaded, or feeling like he is going to lose consciousness, please call 911 to bring him back to the emergency department.

## 2019-05-21 NOTE — ED Triage Notes (Signed)
Pt brought in by [facility] from home for SOB and weakness. Pt denies any c/o himself. Pt states his wife called EMS because he was SOB earlier today. Pt poor historian, when asked a question pt states "I don't know, ask my wife". Pt A+Ox4, bilateral AKA. O2 99% on RA.

## 2019-05-24 ENCOUNTER — Telehealth: Payer: Self-pay | Admitting: Neurology

## 2019-05-24 NOTE — Telephone Encounter (Signed)
Transition Care Management Follow-up Telephone Call   Date discharged? 05/21/19   How have you been since you were released from the hospital? Some better, still some mild tribbling, and weakness   Do you understand why you were in the hospital? yes   Do you understand the discharge instructions? yes   Where were you discharged to? Home   Items Reviewed:  Medications reviewed: yes  Allergies reviewed: yes  Dietary changes reviewed: yes  Referrals reviewed: no, patient was told to see neurologist    Functional Questionnaire:   Activities of Daily Living (ADLs):   He states they are independent in the following: Pt is not indepenting in daily living, he needs assitance in all daily living activities.  States they require assistance with the following: ambulation, bathing and hygiene, feeding, continence, grooming, toileting and dressing   Any transportation issues/concerns?: no   Any patient concerns? no   Confirmed importance and date/time of follow-up visits scheduled yes  Provider Appointment booked with Dr. [name] on appt will be made today.  Confirmed with patient if condition begins to worsen call our office, PCP or go to the ER.  Patient was given the office number and encouraged to call back with question or concerns.  : yes

## 2019-05-24 NOTE — Telephone Encounter (Signed)
Spoke with patient spouse she was sent to front desk to schedule Hospital follow up appt with Dr. [name]

## 2019-05-24 NOTE — Telephone Encounter (Signed)
Patient's wife called stating the patient had been seen in the ED recently and they told him to follow up with Dr. [name]. She said he may need to come in for an injection.

## 2019-05-26 NOTE — Progress Notes (Signed)
Remote pacemaker transmission.   

## 2019-05-26 NOTE — Progress Notes (Signed)
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 He is accompanied by his wife who provides some history.   UPDATE: For pain, he takes Lyrica 150mg  in AM and 300mg  at night. He also takes baclofen 5mg  twice daily. He takes tramadol 50mg  2 to 3 times daily.    Last week, he started feeling short of breath and generalized weakness.  He later started twitching in face and hands.  He had trouble with speaking.  He had no appetite and felt to weak to hold utensils.  He presented to the ED on 05/21/2019 for further evaluation.  He had pneumonia in September but CXR was okay.  Labs, including electrolytes, were okay. It was believed that his symptoms were related to MS.  Symptoms lasted 4 or 5 days and then it resolved.  He is now stronger.  He is eating well.  He denies pain.     HISTORY: He was diagnosed with MS in 1993. Initially, he developed right arm and leg weakness along with balance problems. Symptoms initially improved but had progressed over the years. He was on multiple immunomodulating agents such as Betaseron, Copaxone, Cytoxin and monthly Solumedrol. He currently is not on an agent and hasn't had a flare up in 4 years. He has bilateral ATK ambutations performed in 2013 as a consequence of his peripheral vascular and diabetes. He requires assistance with bathing and dressing, but he is able to feed himself. He uses a power wheelchair. He had been on amantadine for many years.  MRI Brain w/wo from 11/29/04 demonstrated "extensive white matter disease in the supratentorial compartment, which is consistent with advanced multiple sclerosis. There is confluence of the white matter  lesions with atrophy and hydrocephalus, thinned corpus callosum, and black holes, however somewhat unusual for MS of this degree, no infrantentorial lesions are seen and no active lesions are identified by means of contrast." CT of head was performed on 03/23/12 for encephalopathy, which revealed atrophy and nonspecific small vessel ischemic changes.  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 He has some poor short-term memory. Sometimes he calls his wife by his mother's name. He cannot always remember the names of his great grandchildren. He has more difficulty figuring out how to maneuver the motorized wheelchair.  Prior medication: Gabapentin was ineffective. Amantadine was discontinued due to nightmares.  He has Mallard. He is unable to sit due to pain and often is laying in bed. He is frequently turned to avoid pressure ulcers.  PAST MEDICAL HISTORY: Past Medical History:  Diagnosis Date  . Atherosclerotic PVD with ulceration ([place])    left foot  . Bacteremia   . CAD (coronary artery disease) 2007   Dr Meda Coffee -  non-STEMI, .occluded circumflex.. Taxus stent placed...residual 80% LAD...50% RCA  . Cardiomyopathy, ischemic    EF 45% per ECHO 2008  //   EF 25%, echo, August, 2013 //  Echo (8/15):  Mild LVH, EF 30-35%, ant-lat and lat AK, inf HK, Gr 1 DD, mild MR, mild LAE  . Carotid artery disease ([place])    a.  Doppler, February, 2012, 0-39% bilateral,Mild smooth plaque;  b.  Carotid US (8/15):  Bilateral 1-39% ICA >>> F/u 2 years  . Constipation   .  Dementia ([place])   . Diabetes mellitus    INSULIN-DEPENDent  . Fatigue SEVERE  . H/O pleural effusion 2008   POST THORACENTESIS  . History of colon polyps PRECANCEROUS  . Hyperlipidemia   . Hypertension   . Impotence   . Increased prostate specific antigen (PSA) velocity   . Insomnia   . Lung nodule    resolved 11-2006 CT Chest  . Multiple sclerosis ([place]) [place] -- LAST VISIT  11-20-2010  NOTE W/ CHART  . PAC (premature atrial contraction)    December, 2013  . Pulmonary hypertension ([place])    moderate ECHO Jan 2008  . Scoliosis associated with other condition   . Second degree Mobitz I AV block 11/12/2018  . Sleep apnea   . Symptomatic bradycardia 11/13/2018  . Systolic heart failure   . TIA (transient ischemic attack)   . Tobacco abuse    quit   . Urinary retention    dx ~ 2-12, like from [place], now with a catheter, saw urology  . Urinary tract infection    hx of    MEDICATIONS: Current Outpatient Medications on File Prior to Visit  Medication Sig Dispense Refill  . albuterol (PROVENTIL HFA;VENTOLIN HFA) 108 (90 Base) MCG/ACT inhaler Inhale 2 puffs into the lungs every 6 (six) hours as needed for wheezing or shortness of breath. 1 Inhaler 2  . amoxicillin (AMOXIL) 500 MG tablet Take 4 tablets (2,000 mg) 30 minutes prior to dental procedure 4 tablet 0  . aspirin EC 81 MG tablet Take 1 tablet (81 mg total) by mouth daily. 90 tablet 3  . baclofen (LIORESAL) 10 MG tablet Take 0.5 tablets (5 mg total) by mouth 2 (two) times daily. 30 each 5  . carvedilol (COREG) 3.125 MG tablet Take 1 tablet (3.125 mg total) by mouth 2 (two) times daily with a meal. 60 tablet 6  . cetirizine (ZYRTEC) 10 MG tablet Take 10 mg by mouth daily.    . Cholecalciferol (VITAMIN D-3) 1000 units CAPS Take 2,000 Units by mouth daily.    . diclofenac sodium (VOLTAREN) 1 % GEL Apply 1 application topically 2 (two) times daily as needed (for pain).     . furosemide (LASIX) 40 MG tablet Take 40 mg by mouth daily.    Marland Kitchen glucagon (GLUCAGEN) 1 MG SOLR injection Inject 1 mg into the muscle once as needed for low blood sugar. Reported on 11/16/2015    . guaiFENesin (ROBITUSSIN) 100 MG/5ML SOLN Take 5 mLs by mouth every 4 (four) hours as needed for cough or to loosen phlegm.    . insulin aspart protamine - aspart (NOVOLOG 70/30 MIX) (70-30) 100 UNIT/ML FlexPen Inject 0.05 mLs (5 Units total) into the skin  2 (two) times daily. 15 mL 5  . lisinopril (PRINIVIL,ZESTRIL) 10 MG tablet TAKE 1 TABLET BY MOUTH EVERY DAY (Patient taking differently: Take 10 mg by mouth daily. ) 30 tablet 5  . mupirocin ointment (BACTROBAN) 2 % Apply 1 application topically as needed (rash on back).     . pantoprazole (PROTONIX) 40 MG tablet Take 1 tablet (40 mg total) by mouth daily with lunch. 90 tablet 1  . pregabalin (LYRICA) 150 MG capsule TAKE ONE CAPSULE BY MOUTH EVERY MORNING AND 2 CAPSULES EVERY NIGHT AT BEDTIME (Patient taking differently: Take 150 mg by mouth See admin instructions. Tale 150 mg in the morning and 300 mg in the evening) 90 capsule 5  . Propylene Glycol (SYSTANE BALANCE OP)  Place 1-2 drops into both eyes daily as needed (dry eyes).     . rosuvastatin (CRESTOR) 20 MG tablet TAKE 1 TABLET(20 MG) BY MOUTH DAILY (Patient taking differently: Take 20 mg by mouth daily. ) 90 tablet 1  . traMADol (ULTRAM) 50 MG tablet Take 1 tablet (50 mg total) by mouth every 6 (six) hours as needed (for pain). 14 tablet 0  . traZODone (DESYREL) 100 MG tablet Take 100 mg by mouth at bedtime.      No current facility-administered medications on file prior to visit.     ALLERGIES: Allergies  Allergen Reactions  . Bee Venom Anaphylaxis  . Influenza Vaccines Other (See Comments)    Made patient "sick for months"  . Atorvastatin Other (See Comments)    Severe pain in stumps  . Latex Rash    FAMILY HISTORY: Family History  Problem Relation Age of Onset  . Heart attack Mother 21  . Heart disease Mother   . Stroke Mother   . Hyperlipidemia Mother   . Hypertension Mother   . COPD Father   . Peripheral vascular disease Father   . Diabetes Brother   . Heart disease Brother   . Hypertension Brother   . Heart attack Brother   . Diabetes Daughter   . Colon cancer Neg Hx   . Prostate cancer Neg Hx    SOCIAL HISTORY: Social History   Socioeconomic History  . Marital status: Married    Spouse name: Jayvyn Balingit  . Number of children: 2  . Years of education: Not on file  . Highest education level: High school graduate  Occupational History  . Occupation: disable    Employer: RETIRED  Social Needs  . Financial resource strain: Not hard at all  . Food insecurity    Worry: Never true    Inability: Never true  . Transportation needs    Medical: No    Non-medical: No  Tobacco Use  . Smoking status: Former Smoker    Years: 50.00    Quit date: 07/22/2012    Years since quitting: 6.8  . Smokeless tobacco: Current User  . Tobacco comment: pt states that he is using E-cigs  Substance and Sexual Activity  . Alcohol use: No    Alcohol/week: 0.0 standard drinks  . Drug use: No  . Sexual activity: Not Currently    Comment: electronic cigarettes no nicotene  Lifestyle  . Physical activity    Days per week: 0 days    Minutes per session: 0 min  . Stress: Not on file  Relationships  . Social connections    Talks on phone: More than three times a week    Gets together: Three times a week    Attends religious service: 1 to 4 times per year    Active member of club or organization: Yes    Attends meetings of clubs or organizations: 1 to 4 times per year    Relationship status: Married  . Intimate partner violence    Fear of current or ex partner: Patient refused    Emotionally abused: Patient refused    Physically abused: Patient refused    Forced sexual activity: Patient refused  Other Topics Concern  . Not on file  Social History Narrative   Lives w/ wife    REVIEW OF SYSTEMS: Constitutional: No fevers, chills, or sweats, no generalized fatigue, change in appetite Eyes: No visual changes, double vision, eye pain Ear, nose and throat: No hearing loss,  ear pain, nasal congestion, sore throat Cardiovascular: No chest pain, palpitations Respiratory:  No shortness of breath at rest or with exertion, wheezes GastrointestinaI: No nausea, vomiting, diarrhea, abdominal pain, fecal  incontinence Genitourinary:  No dysuria, urinary retention or frequency Musculoskeletal:  No neck pain, back pain Integumentary: No rash, pruritus, skin lesions Neurological: as above Psychiatric: No depression, insomnia, anxiety Endocrine: No palpitations, fatigue, diaphoresis, mood swings, change in appetite, change in weight, increased thirst Hematologic/Lymphatic:  No purpura, petechiae. Allergic/Immunologic: no itchy/runny eyes, nasal congestion, recent allergic reactions, rashes  PHYSICAL EXAM: Blood pressure 104/70, pulse 61, SpO2 97 %. General: No acute distress.  Patient appears well-groomed.   Head:  Normocephalic/atraumatic Eyes:  Fundi examined but not visualized Neck: supple, no paraspinal tenderness, full range of motion Heart:  Regular rate and rhythm  Lungs:  Clear to auscultation bilaterally Back: No paraspinal tenderness Neurological Exam: alert and oriented to person, place, and time. Attention span and concentration intact, recent and remote memory intact, fund of knowledge intact.  Speech fluent and not dysarthric, language intact.  CN II-XII intact. Bulk and tone normal, muscle strength 5-/5 throughout, bilateral lower extremity amputation  Sensation to light touch intact.  Deep tendon reflexes 2+ upper extremities.  Finger to nose testing negative.  IMPRESSION: Multiple sclerosis.  Recent episode of fatigue and weakness last week.  Now resolved.  May be MS flare.  As it has resolved, no further management warranted.  In the future, if he should have another acute episode of weakness/possible MS flare, then we may consider H.P. Acthar Gel injections.  I would avoid steroids due to patient's diabetes.  He takes baclofen 5mg  twice daily.  If he has episodes of increased spasms, he may take 10mg  three times daily if needed.  Follow up in March as already scheduled.  15 minutes spent face to face with patient, over 50% spent discussing management.   Metta Clines, DO   CC: Billey Gosling, MD

## 2019-05-27 ENCOUNTER — Ambulatory Visit (INDEPENDENT_AMBULATORY_CARE_PROVIDER_SITE_OTHER): Payer: Medicare Other | Admitting: Neurology

## 2019-05-27 ENCOUNTER — Other Ambulatory Visit: Payer: Self-pay

## 2019-05-27 ENCOUNTER — Encounter: Payer: Self-pay | Admitting: Neurology

## 2019-05-27 VITALS — BP 104/70 | HR 61

## 2019-05-27 DIAGNOSIS — I10 Essential (primary) hypertension: Secondary | ICD-10-CM | POA: Diagnosis not present

## 2019-05-27 DIAGNOSIS — I255 Ischemic cardiomyopathy: Secondary | ICD-10-CM

## 2019-05-27 DIAGNOSIS — G35 Multiple sclerosis: Secondary | ICD-10-CM

## 2019-05-27 DIAGNOSIS — R531 Weakness: Secondary | ICD-10-CM | POA: Diagnosis not present

## 2019-05-27 DIAGNOSIS — I5022 Chronic systolic (congestive) heart failure: Secondary | ICD-10-CM | POA: Diagnosis not present

## 2019-05-27 DIAGNOSIS — E1165 Type 2 diabetes mellitus with hyperglycemia: Secondary | ICD-10-CM | POA: Diagnosis not present

## 2019-05-27 NOTE — Patient Instructions (Signed)
1.  I wouldn't add anything since weakness has improved. 2.  If you start having increased spasms, you may take baclofen 10mg  up to three times daily 3.  Follow up in March as scheduled.

## 2019-05-31 ENCOUNTER — Telehealth: Payer: Self-pay | Admitting: Internal Medicine

## 2019-05-31 NOTE — Telephone Encounter (Signed)
We received a renewal parking placard via mail. Form has been completed & signed, Copy sent to scan.   Original mailed back to patient.

## 2019-06-01 ENCOUNTER — Other Ambulatory Visit: Payer: Self-pay | Admitting: *Deleted

## 2019-06-01 NOTE — Patient Outreach (Signed)
[facility]) Care Management  06/01/2019  Jaime [name] 02/25/1941 [id number]   Call placed to member's wife/caregiver to follow up on member's condition. She report member is doing well, had MS flare and was seen in the ED on 10/30.  Follow up with Neurology on 11/5, no changes to treatment plan at this time.  She continues to expresses concern regarding ability to care for member as well as her daughter.  She is having more difficulty using her arms due to muscle tears and state she will need surgery on her knees soon.  Also report that her daughter will be in need of heart surgery in the coming months.  She has not completed paperwork for [facility], state she is waiting for her tax papers, expect to receive them in the next couple weeks and she will submit to [place] at that time. Denies any urgent needs, state she continues to have support daily through in home aide services, report member is doing "relatively well."  Will follow up with wife within the next month.    [facility] CM Care Plan Problem One     Most Recent Value  Care Plan Problem One  Risk for decreased support evidenced by caregiver burnout  Role Documenting the Problem One  Care Management [place] for Problem One  Active  [facility] CM Short Term Goal #1   Member's wife will report submitting tax forms to [place] within the next 4 weeks  [facility] CM Short Term Goal #1 Start Date  06/01/19  Interventions for Short Term Goal #1  Adivsed of importance of submitting paperwork in effort to develop a plan for member's placement  [facility] CM Short Term Goal #2   Member will report plan to complete POA paperwork within the next 4 weeks  [facility] CM Short Term Goal #2 Start Date  06/01/19  Interventions for Short Term Goal #2  Requested social worker to follow up with wife regarding POA and legal questions     Jaime [name], Therapist, sports, MSN [place] Manager [phone]

## 2019-06-02 ENCOUNTER — Other Ambulatory Visit: Payer: Self-pay

## 2019-06-02 ENCOUNTER — Other Ambulatory Visit: Payer: TRICARE For Life (TFL) | Admitting: Hospice

## 2019-06-02 DIAGNOSIS — Z794 Long term (current) use of insulin: Secondary | ICD-10-CM

## 2019-06-02 DIAGNOSIS — Z515 Encounter for palliative care: Secondary | ICD-10-CM | POA: Diagnosis not present

## 2019-06-02 DIAGNOSIS — E1159 Type 2 diabetes mellitus with other circulatory complications: Secondary | ICD-10-CM

## 2019-06-02 NOTE — Progress Notes (Signed)
[place] Consult Note Telephone: [phone]  Fax: [phone]  PATIENT NAME: Jaime [name] DOB: 1940-08-15 MRN: [id number]  PRIMARY CARE PROVIDER:   [name], MD  REFERRING PROVIDER:  [name], MD [place],  [place] [address]  RESPONSIBLE PARTY:  Jaime [name], wife [phone]   TELEHEALTH VISIT STATEMENT Due to the COVID-19 crisis, this visit was done via telephone from my office. It was initiated and consented to by this patient and/or family.  RECOMMENDATIONS/PLAN:   Advance Care Planning/Goals of Care: Telehealth Visit consisted of building trust and discussions on Palliative Medicine as specialized medical care for people living with serious illness, aimed at facilitating advance care plan, symptoms relief and establishing goals of care. Goals of care include to maximize quality of life and symptom management. Full Code status. Only wants CPR done once and if unsuccessful then STOP.  MOST form uploaded in [place]. Symptom management: Patient/spouse reported he went to ED last month for weakness related to MS flare. Weakness resolved. Patient was later seen by his Neurologist who informed patient of new treatment for MS flare with no risk of increasing serum glucose levels - injectable Hpacthur gel. The plan is to use this treatment next time he has MS flare. Patient with no complaint of weakness, pain or discomfort at this time. Ongoing  Dementia FAST 6d at baseline. Last A1c 6.1 in August '20. To continue with Novolog as ordered.  Follow up: Palliative care will continue to follow patient for goals of care clarification and symptom management   I spent 25 minutes providing this consultation, from  9am to 9.25am. More than 50% of the time in this consultation was spent on coordinating communication  HISTORY OF PRESENT ILLNESS:Jaime E Spinksis a 78 y.o.year oldmalewith multiple medical problems  including Dementia, DMT2, CAD, CHF, MS, has pacemaker placement. Palliative Care was asked to help address goals of care.  CODE STATUS: Full  PPS: 40% HOSPICE ELIGIBILITY/DIAGNOSIS: TBD  PAST MEDICAL HISTORY:  Past Medical History:  Diagnosis Date  . Atherosclerotic PVD with ulceration ([place])    left foot  . Bacteremia   . CAD (coronary artery disease) 2007   Dr Meda Coffee -  non-STEMI, .occluded circumflex.. Taxus stent placed...residual 80% LAD...50% RCA  . Cardiomyopathy, ischemic    EF 45% per ECHO 2008  //   EF 25%, echo, August, 2013 //  Echo (8/15):  Mild LVH, EF 30-35%, ant-lat and lat AK, inf HK, Gr 1 DD, mild MR, mild LAE  . Carotid artery disease ([place])    a.  Doppler, February, 2012, 0-39% bilateral,Mild smooth plaque;  b.  Carotid US (8/15):  Bilateral 1-39% ICA >>> F/u 2 years  . Constipation   . Dementia ([place])   . Diabetes mellitus    INSULIN-DEPENDent  . Fatigue SEVERE  . H/O pleural effusion 2008   POST THORACENTESIS  . History of colon polyps PRECANCEROUS  . Hyperlipidemia   . Hypertension   . Impotence   . Increased prostate specific antigen (PSA) velocity   . Insomnia   . Lung nodule    resolved 11-2006 CT Chest  . Multiple sclerosis ([place]) [place] -- LAST VISIT 11-20-2010  NOTE W/ CHART  . PAC (premature atrial contraction)    December, 2013  . Pulmonary hypertension ([place])    moderate ECHO Jan 2008  . Scoliosis associated with other condition   . Second degree Mobitz I  AV block 11/12/2018  . Sleep apnea   . Symptomatic bradycardia 11/13/2018  . Systolic heart failure   . TIA (transient ischemic attack)   . Tobacco abuse    quit   . Urinary retention    dx ~ 2-12, like from [place], now with a catheter, saw urology  . Urinary tract infection    hx of    SOCIAL HX:  Social History   Tobacco Use  . Smoking status: Former Smoker    Years: 50.00    Quit date: 07/22/2012    Years since quitting: 6.8  . Smokeless tobacco: Current  User  . Tobacco comment: pt states that he is using E-cigs  Substance Use Topics  . Alcohol use: No    Alcohol/week: 0.0 standard drinks    ALLERGIES:  Allergies  Allergen Reactions  . Bee Venom Anaphylaxis  . Influenza Vaccines Other (See Comments)    Made patient "sick for months"  . Atorvastatin Other (See Comments)    Severe pain in stumps  . Latex Rash     PERTINENT MEDICATIONS:  Outpatient Encounter Medications as of 06/02/2019  Medication Sig  . albuterol (PROVENTIL HFA;VENTOLIN HFA) 108 (90 Base) MCG/ACT inhaler Inhale 2 puffs into the lungs every 6 (six) hours as needed for wheezing or shortness of breath.  Marland Kitchen amoxicillin (AMOXIL) 500 MG tablet Take 4 tablets (2,000 mg) 30 minutes prior to dental procedure  . aspirin EC 81 MG tablet Take 1 tablet (81 mg total) by mouth daily.  . baclofen (LIORESAL) 10 MG tablet Take 0.5 tablets (5 mg total) by mouth 2 (two) times daily.  . carvedilol (COREG) 3.125 MG tablet Take 1 tablet (3.125 mg total) by mouth 2 (two) times daily with a meal.  . cetirizine (ZYRTEC) 10 MG tablet Take 10 mg by mouth daily.  . Cholecalciferol (VITAMIN D-3) 1000 units CAPS Take 2,000 Units by mouth daily.  . diclofenac sodium (VOLTAREN) 1 % GEL Apply 1 application topically 2 (two) times daily as needed (for pain).   . furosemide (LASIX) 40 MG tablet Take 40 mg by mouth daily.  Marland Kitchen glucagon (GLUCAGEN) 1 MG SOLR injection Inject 1 mg into the muscle once as needed for low blood sugar. Reported on 11/16/2015  . guaiFENesin (ROBITUSSIN) 100 MG/5ML SOLN Take 5 mLs by mouth every 4 (four) hours as needed for cough or to loosen phlegm.  . insulin aspart protamine - aspart (NOVOLOG 70/30 MIX) (70-30) 100 UNIT/ML FlexPen Inject 0.05 mLs (5 Units total) into the skin 2 (two) times daily.  Marland Kitchen lisinopril (PRINIVIL,ZESTRIL) 10 MG tablet TAKE 1 TABLET BY MOUTH EVERY DAY (Patient taking differently: Take 10 mg by mouth daily. )  . mupirocin ointment (BACTROBAN) 2 % Apply 1  application topically as needed (rash on back).   . pantoprazole (PROTONIX) 40 MG tablet Take 1 tablet (40 mg total) by mouth daily with lunch.  . pregabalin (LYRICA) 150 MG capsule TAKE ONE CAPSULE BY MOUTH EVERY MORNING AND 2 CAPSULES EVERY NIGHT AT BEDTIME (Patient taking differently: Take 150 mg by mouth See admin instructions. Tale 150 mg in the morning and 300 mg in the evening)  . Propylene Glycol (SYSTANE BALANCE OP) Place 1-2 drops into both eyes daily as needed (dry eyes).   . rosuvastatin (CRESTOR) 20 MG tablet TAKE 1 TABLET(20 MG) BY MOUTH DAILY (Patient taking differently: Take 20 mg by mouth daily. )  . traMADol (ULTRAM) 50 MG tablet Take 1 tablet (50 mg total) by mouth every 6 (  six) hours as needed (for pain).  . traZODone (DESYREL) 100 MG tablet Take 100 mg by mouth at bedtime.    No facility-administered encounter medications on file as of 06/02/2019.     Teodoro Spray, NP

## 2019-06-04 ENCOUNTER — Other Ambulatory Visit: Payer: Self-pay | Admitting: Neurology

## 2019-06-04 ENCOUNTER — Telehealth: Payer: Self-pay | Admitting: Neurology

## 2019-06-04 MED ORDER — BACLOFEN 10 MG PO TABS: 5.0000 mg | ORAL_TABLET | Freq: Two times a day (BID) | ORAL | 5 refills | Status: DC

## 2019-06-04 MED ORDER — TRAMADOL HCL 50 MG PO TABS: 50.0000 mg | ORAL_TABLET | Freq: Four times a day (QID) | ORAL | 2 refills | Status: AC | PRN

## 2019-06-04 NOTE — Telephone Encounter (Signed)
Patient's wife [name] called in needing to see if Dr. [name] can call in a refill for his Baclofen 10 MG and Tramadol 50 MG. She said that the Doctor at the Nursing home was prescribing them but he's not there anymore. [facility] [place] needs a new Rx submitted. Thank you

## 2019-06-04 NOTE — Telephone Encounter (Signed)
Prescriptions filled

## 2019-06-04 NOTE — Telephone Encounter (Signed)
Spoke with patient spouse she was informed of provider response and that Rx was sent

## 2019-06-04 NOTE — Telephone Encounter (Signed)
Pt last seen in office 05/27/19 are you ok with prescribing this medication for patient?

## 2019-06-10 DIAGNOSIS — N390 Urinary tract infection, site not specified: Secondary | ICD-10-CM | POA: Diagnosis not present

## 2019-06-25 ENCOUNTER — Inpatient Hospital Stay (HOSPITAL_COMMUNITY)
Admission: EM | Admit: 2019-06-25 | Discharge: 2019-06-29 | DRG: 872 | Disposition: A | Discharge status: Home / Self Care | Payer: Medicare Other | Attending: Internal Medicine | Admitting: Internal Medicine

## 2019-06-25 ENCOUNTER — Telehealth: Payer: Self-pay | Admitting: Cardiology

## 2019-06-25 ENCOUNTER — Emergency Department (HOSPITAL_COMMUNITY): Payer: Medicare Other

## 2019-06-25 ENCOUNTER — Encounter (HOSPITAL_COMMUNITY): Payer: Self-pay | Admitting: Internal Medicine

## 2019-06-25 DIAGNOSIS — Z794 Long term (current) use of insulin: Secondary | ICD-10-CM

## 2019-06-25 DIAGNOSIS — F0390 Unspecified dementia without behavioral disturbance: Secondary | ICD-10-CM | POA: Diagnosis present

## 2019-06-25 DIAGNOSIS — Z950 Presence of cardiac pacemaker: Secondary | ICD-10-CM

## 2019-06-25 DIAGNOSIS — R6510 Systemic inflammatory response syndrome (SIRS) of non-infectious origin without acute organ dysfunction: Secondary | ICD-10-CM

## 2019-06-25 DIAGNOSIS — Z9079 Acquired absence of other genital organ(s): Secondary | ICD-10-CM

## 2019-06-25 DIAGNOSIS — I5022 Chronic systolic (congestive) heart failure: Secondary | ICD-10-CM | POA: Diagnosis present

## 2019-06-25 DIAGNOSIS — I442 Atrioventricular block, complete: Secondary | ICD-10-CM | POA: Diagnosis present

## 2019-06-25 DIAGNOSIS — R6520 Severe sepsis without septic shock: Secondary | ICD-10-CM | POA: Diagnosis present

## 2019-06-25 DIAGNOSIS — G8220 Paraplegia, unspecified: Secondary | ICD-10-CM | POA: Diagnosis present

## 2019-06-25 DIAGNOSIS — I255 Ischemic cardiomyopathy: Secondary | ICD-10-CM | POA: Diagnosis present

## 2019-06-25 DIAGNOSIS — I272 Pulmonary hypertension, unspecified: Secondary | ICD-10-CM | POA: Diagnosis present

## 2019-06-25 DIAGNOSIS — Z955 Presence of coronary angioplasty implant and graft: Secondary | ICD-10-CM

## 2019-06-25 DIAGNOSIS — Z91040 Latex allergy status: Secondary | ICD-10-CM

## 2019-06-25 DIAGNOSIS — E1159 Type 2 diabetes mellitus with other circulatory complications: Secondary | ICD-10-CM

## 2019-06-25 DIAGNOSIS — E861 Hypovolemia: Secondary | ICD-10-CM | POA: Diagnosis present

## 2019-06-25 DIAGNOSIS — I110 Hypertensive heart disease with heart failure: Secondary | ICD-10-CM | POA: Diagnosis present

## 2019-06-25 DIAGNOSIS — Z833 Family history of diabetes mellitus: Secondary | ICD-10-CM

## 2019-06-25 DIAGNOSIS — A419 Sepsis, unspecified organism: Principal | ICD-10-CM | POA: Diagnosis present

## 2019-06-25 DIAGNOSIS — E785 Hyperlipidemia, unspecified: Secondary | ICD-10-CM | POA: Diagnosis present

## 2019-06-25 DIAGNOSIS — R609 Edema, unspecified: Secondary | ICD-10-CM | POA: Diagnosis not present

## 2019-06-25 DIAGNOSIS — N390 Urinary tract infection, site not specified: Secondary | ICD-10-CM | POA: Diagnosis present

## 2019-06-25 DIAGNOSIS — R0902 Hypoxemia: Secondary | ICD-10-CM | POA: Diagnosis not present

## 2019-06-25 DIAGNOSIS — E119 Type 2 diabetes mellitus without complications: Secondary | ICD-10-CM

## 2019-06-25 DIAGNOSIS — Z20828 Contact with and (suspected) exposure to other viral communicable diseases: Secondary | ICD-10-CM | POA: Diagnosis present

## 2019-06-25 DIAGNOSIS — D638 Anemia in other chronic diseases classified elsewhere: Secondary | ICD-10-CM | POA: Diagnosis present

## 2019-06-25 DIAGNOSIS — I739 Peripheral vascular disease, unspecified: Secondary | ICD-10-CM | POA: Diagnosis present

## 2019-06-25 DIAGNOSIS — I2510 Atherosclerotic heart disease of native coronary artery without angina pectoris: Secondary | ICD-10-CM | POA: Diagnosis present

## 2019-06-25 DIAGNOSIS — I959 Hypotension, unspecified: Secondary | ICD-10-CM | POA: Diagnosis not present

## 2019-06-25 DIAGNOSIS — Z7982 Long term (current) use of aspirin: Secondary | ICD-10-CM

## 2019-06-25 DIAGNOSIS — N179 Acute kidney failure, unspecified: Secondary | ICD-10-CM

## 2019-06-25 DIAGNOSIS — Z22322 Carrier or suspected carrier of Methicillin resistant Staphylococcus aureus: Secondary | ICD-10-CM

## 2019-06-25 DIAGNOSIS — D696 Thrombocytopenia, unspecified: Secondary | ICD-10-CM | POA: Diagnosis present

## 2019-06-25 DIAGNOSIS — N400 Enlarged prostate without lower urinary tract symptoms: Secondary | ICD-10-CM | POA: Diagnosis present

## 2019-06-25 DIAGNOSIS — G4730 Sleep apnea, unspecified: Secondary | ICD-10-CM | POA: Diagnosis present

## 2019-06-25 DIAGNOSIS — G35 Multiple sclerosis: Secondary | ICD-10-CM | POA: Diagnosis present

## 2019-06-25 DIAGNOSIS — E1151 Type 2 diabetes mellitus with diabetic peripheral angiopathy without gangrene: Secondary | ICD-10-CM | POA: Diagnosis present

## 2019-06-25 DIAGNOSIS — B961 Klebsiella pneumoniae [K. pneumoniae] as the cause of diseases classified elsewhere: Secondary | ICD-10-CM | POA: Diagnosis present

## 2019-06-25 DIAGNOSIS — Z8719 Personal history of other diseases of the digestive system: Secondary | ICD-10-CM

## 2019-06-25 DIAGNOSIS — Z91030 Bee allergy status: Secondary | ICD-10-CM

## 2019-06-25 DIAGNOSIS — G4700 Insomnia, unspecified: Secondary | ICD-10-CM | POA: Diagnosis present

## 2019-06-25 DIAGNOSIS — I252 Old myocardial infarction: Secondary | ICD-10-CM | POA: Diagnosis not present

## 2019-06-25 DIAGNOSIS — Z9049 Acquired absence of other specified parts of digestive tract: Secondary | ICD-10-CM

## 2019-06-25 DIAGNOSIS — Z79891 Long term (current) use of opiate analgesic: Secondary | ICD-10-CM

## 2019-06-25 DIAGNOSIS — K219 Gastro-esophageal reflux disease without esophagitis: Secondary | ICD-10-CM | POA: Diagnosis present

## 2019-06-25 DIAGNOSIS — Z7401 Bed confinement status: Secondary | ICD-10-CM

## 2019-06-25 DIAGNOSIS — Z79899 Other long term (current) drug therapy: Secondary | ICD-10-CM

## 2019-06-25 DIAGNOSIS — Z8673 Personal history of transient ischemic attack (TIA), and cerebral infarction without residual deficits: Secondary | ICD-10-CM

## 2019-06-25 DIAGNOSIS — Z89611 Acquired absence of right leg above knee: Secondary | ICD-10-CM

## 2019-06-25 DIAGNOSIS — Z887 Allergy status to serum and vaccine status: Secondary | ICD-10-CM

## 2019-06-25 DIAGNOSIS — I10 Essential (primary) hypertension: Secondary | ICD-10-CM | POA: Diagnosis present

## 2019-06-25 DIAGNOSIS — Z881 Allergy status to other antibiotic agents status: Secondary | ICD-10-CM

## 2019-06-25 DIAGNOSIS — R069 Unspecified abnormalities of breathing: Secondary | ICD-10-CM | POA: Diagnosis not present

## 2019-06-25 DIAGNOSIS — Z8249 Family history of ischemic heart disease and other diseases of the circulatory system: Secondary | ICD-10-CM

## 2019-06-25 DIAGNOSIS — Z720 Tobacco use: Secondary | ICD-10-CM

## 2019-06-25 DIAGNOSIS — Z87440 Personal history of urinary (tract) infections: Secondary | ICD-10-CM

## 2019-06-25 DIAGNOSIS — I9589 Other hypotension: Secondary | ICD-10-CM

## 2019-06-25 DIAGNOSIS — Z89612 Acquired absence of left leg above knee: Secondary | ICD-10-CM

## 2019-06-25 LAB — BRAIN NATRIURETIC PEPTIDE: B Natriuretic Peptide: 66 pg/mL (ref 0.0–100.0)

## 2019-06-25 LAB — CBC
HCT: 35.8 % — ABNORMAL LOW (ref 39.0–52.0)
Hemoglobin: 11.5 g/dL — ABNORMAL LOW (ref 13.0–17.0)
MCH: 26.7 pg (ref 26.0–34.0)
MCHC: 32.1 g/dL (ref 30.0–36.0)
MCV: 83.1 fL (ref 80.0–100.0)
Platelets: 135 10*3/uL — ABNORMAL LOW (ref 150–400)
RBC: 4.31 MIL/uL (ref 4.22–5.81)
RDW: 16.5 % — ABNORMAL HIGH (ref 11.5–15.5)
WBC: 5 10*3/uL (ref 4.0–10.5)
nRBC: 0 % (ref 0.0–0.2)

## 2019-06-25 LAB — TROPONIN I (HIGH SENSITIVITY): Troponin I (High Sensitivity): 9 ng/L (ref <18)

## 2019-06-25 LAB — URINALYSIS, ROUTINE W REFLEX MICROSCOPIC
Bilirubin Urine: NEGATIVE
Glucose, UA: NEGATIVE mg/dL
Ketones, ur: NEGATIVE mg/dL
Nitrite: NEGATIVE
Protein, ur: 30 mg/dL — AB
Specific Gravity, Urine: 1.01 (ref 1.005–1.030)
WBC, UA: >50 WBC/hpf — ABNORMAL HIGH (ref 0–5)
pH: 5 (ref 5.0–8.0)

## 2019-06-25 LAB — BASIC METABOLIC PANEL
Anion gap: 10 (ref 5–15)
BUN: 31 mg/dL — ABNORMAL HIGH (ref 8–23)
CO2: 19 mmol/L — ABNORMAL LOW (ref 22–32)
Calcium: 8.4 mg/dL — ABNORMAL LOW (ref 8.9–10.3)
Chloride: 104 mmol/L (ref 98–111)
Creatinine, Ser: 1.48 mg/dL — ABNORMAL HIGH (ref 0.61–1.24)
GFR calc Af Amer: 52 mL/min — ABNORMAL LOW (ref >60)
GFR calc non Af Amer: 45 mL/min — ABNORMAL LOW (ref >60)
Glucose, Bld: 151 mg/dL — ABNORMAL HIGH (ref 70–99)
Potassium: 4.4 mmol/L (ref 3.5–5.1)
Sodium: 133 mmol/L — ABNORMAL LOW (ref 135–145)

## 2019-06-25 MED ORDER — LACTATED RINGERS IV BOLUS (SEPSIS)
1000.0000 mL | Freq: Once | INTRAVENOUS | Status: AC
  Administered 2019-06-25: 22:00:00 1000 mL via INTRAVENOUS

## 2019-06-25 MED ORDER — PREGABALIN 25 MG PO CAPS: 150.0000 mg | ORAL_CAPSULE | ORAL | Status: DC

## 2019-06-25 MED ORDER — PANTOPRAZOLE SODIUM 40 MG PO TBEC
40.0000 mg | DELAYED_RELEASE_TABLET | Freq: Every day | ORAL | Status: DC
  Administered 2019-06-26 – 2019-06-29 (×4): 40 mg via ORAL
  Filled 2019-06-25 (×4): qty 1

## 2019-06-25 MED ORDER — ACETAMINOPHEN 325 MG PO TABS
650.0000 mg | ORAL_TABLET | Freq: Four times a day (QID) | ORAL | Status: DC | PRN
  Administered 2019-06-28: 14:00:00 650 mg via ORAL
  Filled 2019-06-25: qty 2

## 2019-06-25 MED ORDER — ROSUVASTATIN CALCIUM 20 MG PO TABS
20.0000 mg | ORAL_TABLET | Freq: Every day | ORAL | Status: DC
  Administered 2019-06-26 – 2019-06-29 (×4): 20 mg via ORAL
  Filled 2019-06-25 (×6): qty 1

## 2019-06-25 MED ORDER — PREGABALIN 75 MG PO CAPS
300.0000 mg | ORAL_CAPSULE | Freq: Every day | ORAL | Status: DC
  Administered 2019-06-26 – 2019-06-28 (×4): 300 mg via ORAL
  Filled 2019-06-25: qty 3
  Filled 2019-06-25 (×3): qty 4

## 2019-06-25 MED ORDER — ONDANSETRON HCL 4 MG/2ML IJ SOLN: 4.0000 mg | Freq: Four times a day (QID) | INTRAMUSCULAR | Status: DC | PRN

## 2019-06-25 MED ORDER — VANCOMYCIN HCL IN DEXTROSE 1-5 GM/200ML-% IV SOLN: 1000.0000 mg | INTRAVENOUS | Status: DC

## 2019-06-25 MED ORDER — ENOXAPARIN SODIUM 40 MG/0.4ML ~~LOC~~ SOLN
40.0000 mg | Freq: Every day | SUBCUTANEOUS | Status: DC
  Administered 2019-06-26 – 2019-06-29 (×4): 40 mg via SUBCUTANEOUS
  Filled 2019-06-25 (×4): qty 0.4

## 2019-06-25 MED ORDER — BACLOFEN 5 MG HALF TABLET
5.0000 mg | ORAL_TABLET | Freq: Two times a day (BID) | ORAL | Status: DC
  Administered 2019-06-26 – 2019-06-29 (×8): 5 mg via ORAL
  Filled 2019-06-25 (×10): qty 1

## 2019-06-25 MED ORDER — SODIUM CHLORIDE 0.9 % IV SOLN
2.0000 g | Freq: Two times a day (BID) | INTRAVENOUS | Status: DC
  Administered 2019-06-26 – 2019-06-29 (×7): 2 g via INTRAVENOUS
  Filled 2019-06-25 (×9): qty 2

## 2019-06-25 MED ORDER — ALBUTEROL SULFATE (2.5 MG/3ML) 0.083% IN NEBU: 2.5000 mg | INHALATION_SOLUTION | Freq: Four times a day (QID) | RESPIRATORY_TRACT | Status: DC | PRN

## 2019-06-25 MED ORDER — VANCOMYCIN HCL IN DEXTROSE 1-5 GM/200ML-% IV SOLN: 1000.0000 mg | Freq: Once | INTRAVENOUS | Status: DC

## 2019-06-25 MED ORDER — LACTATED RINGERS IV BOLUS (SEPSIS)
250.0000 mL | Freq: Once | INTRAVENOUS | Status: AC
  Administered 2019-06-25: 23:00:00 250 mL via INTRAVENOUS

## 2019-06-25 MED ORDER — METRONIDAZOLE IN NACL 5-0.79 MG/ML-% IV SOLN
500.0000 mg | Freq: Once | INTRAVENOUS | Status: AC
  Administered 2019-06-25: 21:00:00 500 mg via INTRAVENOUS
  Filled 2019-06-25: qty 100

## 2019-06-25 MED ORDER — ACETAMINOPHEN 650 MG RE SUPP: 650.0000 mg | Freq: Four times a day (QID) | RECTAL | Status: DC | PRN

## 2019-06-25 MED ORDER — SODIUM CHLORIDE 0.9 % IV SOLN
2.0000 g | Freq: Once | INTRAVENOUS | Status: AC
  Administered 2019-06-25: 22:00:00 2 g via INTRAVENOUS
  Filled 2019-06-25: qty 2

## 2019-06-25 MED ORDER — INSULIN ASPART 100 UNIT/ML ~~LOC~~ SOLN: 0.0000 [IU] | SUBCUTANEOUS | Status: DC

## 2019-06-25 MED ORDER — ASPIRIN EC 81 MG PO TBEC
81.0000 mg | DELAYED_RELEASE_TABLET | Freq: Every day | ORAL | Status: DC
  Administered 2019-06-26 – 2019-06-29 (×4): 81 mg via ORAL
  Filled 2019-06-25 (×4): qty 1

## 2019-06-25 MED ORDER — VANCOMYCIN HCL 10 G IV SOLR
1250.0000 mg | Freq: Once | INTRAVENOUS | Status: AC
  Administered 2019-06-26: 01:00:00 1250 mg via INTRAVENOUS
  Filled 2019-06-25: qty 1250

## 2019-06-25 MED ORDER — LACTATED RINGERS IV BOLUS (SEPSIS)
1000.0000 mL | Freq: Once | INTRAVENOUS | Status: AC
  Administered 2019-06-25: 21:00:00 1000 mL via INTRAVENOUS

## 2019-06-25 MED ORDER — PREGABALIN 75 MG PO CAPS
150.0000 mg | ORAL_CAPSULE | Freq: Every day | ORAL | Status: DC
  Administered 2019-06-26 – 2019-06-29 (×4): 150 mg via ORAL
  Filled 2019-06-25: qty 6
  Filled 2019-06-25 (×2): qty 2
  Filled 2019-06-25: qty 1

## 2019-06-25 MED ORDER — ONDANSETRON HCL 4 MG PO TABS: 4.0000 mg | ORAL_TABLET | Freq: Four times a day (QID) | ORAL | Status: DC | PRN

## 2019-06-25 NOTE — ED Provider Notes (Signed)
[facility] EMERGENCY DEPARTMENT Provider Note   CSN: [id number] Arrival date & time: 06/25/19  1818     History   Chief Complaint Chief Complaint  Patient presents with   Hypotension    HPI Jaime [name] is a 78 y.o. male.     HPI   Patient presents by EMS, after his wife called them.  According to notes in the chart, she had contacted a nurse, who works with his cardiologist, to report that he has been retaining fluids and is swollen in his face.  Apparently also, his Lasix was recently increased to 40 mg twice daily.  Despite this his swelling has continued.  The patient is not able to give any history.  Level 5 caveat-altered mental status  Past Medical History:  Diagnosis Date   Atherosclerotic PVD with ulceration ([place])    left foot   Bacteremia    CAD (coronary artery disease) 2007   Dr Meda Coffee -  non-STEMI, .occluded circumflex.. Taxus stent placed...residual 80% LAD...50% RCA   Cardiomyopathy, ischemic    EF 45% per ECHO 2008  //   EF 25%, echo, August, 2013 //  Echo (8/15):  Mild LVH, EF 30-35%, ant-lat and lat AK, inf HK, Gr 1 DD, mild MR, mild LAE   Carotid artery disease ([place])    a.  Doppler, February, 2012, 0-39% bilateral,Mild smooth plaque;  b.  Carotid US (8/15):  Bilateral 1-39% ICA >>> F/u 2 years   Constipation    Dementia (HCC)    Diabetes mellitus    INSULIN-DEPENDent   Fatigue SEVERE   H/O pleural effusion 2008   POST THORACENTESIS   History of colon polyps PRECANCEROUS   Hyperlipidemia    Hypertension    Impotence    Increased prostate specific antigen (PSA) velocity    Insomnia    Lung nodule    resolved 11-2006 CT Chest   Multiple sclerosis ([place]) DX 1993   NEUROLOGIST-  DR DOHMEIER -- LAST VISIT 11-20-2010  NOTE W/ CHART   PAC (premature atrial contraction)    December, 2013   Pulmonary hypertension ([place])    moderate ECHO Jan 2008   Scoliosis associated with other condition    Second degree  Mobitz I AV block 11/12/2018   Sleep apnea    Symptomatic bradycardia [id number]   Systolic heart failure    TIA (transient ischemic attack)    Tobacco abuse    quit    Urinary retention    dx ~ 2-12, like from [place], now with a catheter, saw urology   Urinary tract infection    hx of    Patient Active Problem List   Diagnosis Date Noted   Community acquired pneumonia 03/31/2019   Heart block AV complete ([place]) 02/12/2019   Biventricular cardiac pacemaker in situ [id number]   Acute metabolic encephalopathy [id number]   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 11/16/2018   Second degree Mobitz I AV block 11/12/2018   Abnormal liver function    UTI due to Klebsiella species 11/24/2017   Hypoglycemia    GERD (gastroesophageal reflux disease) 04/10/2016   Coronary artery disease involving native coronary artery of native heart without angina pectoris 12/28/2015   PAD (peripheral artery disease) ([place]) 12/28/2015   Symptomatic bradycardia [id number]   Chronic systolic heart failure ([place]) 08/14/2015   Phantom limb pain ([place]) 12/08/2014   Syncope 11/01/2014   Multiple sclerosis, 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   Paraparesis of both lower limbs ([place]) 05/31/2014  Scoliosis, thoracogenic, acquired 03/15/2014   Mild cognitive impairment 02/11/2014   PVD (peripheral vascular disease) ([place]) 08/13/2013   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   PAC (premature atrial contraction)    Peripheral vascular disease, unspecified ([place]) 07/03/2012   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   Ejection fraction    Cardiomyopathy, ischemic    Pulmonary hypertension ([place])    Hemiparesis ([place])    Essential hypertension    Carotid artery disease ([place])    Hyperlipidemia 07/31/2007   BPH (benign prostatic hyperplasia) 04/15/2007   DMII (diabetes mellitus, type 2) ([place]) 12/18/2006    Past  Surgical History:  Procedure Laterality Date   ABDOMINAL ANGIOGRAM  12/17/2011   Procedure: ABDOMINAL ANGIOGRAM;  Surgeon: Serafina Mitchell, MD;  Location: [facility] C[facility] LAB;  Service: Cardiovascular;;   ABDOMINAL AORTAGRAM N/A 08/19/2013   Procedure: ABDOMINAL Maxcine Ham;  Surgeon: Conrad Montura, MD;  Location: [facility] CATH LAB;  Service: Cardiovascular;  Laterality: N/A;   AMPUTATION  03/17/2012   Procedure: AMPUTATION ABOVE KNEE;  Surgeon: Conrad Schulenburg, MD;  Location: [place];  Service: Vascular;  Laterality: Left;   AMPUTATION  06/03/2012   Procedure: AMPUTATION ABOVE KNEE;  Surgeon: Conrad [place], MD;  Location: [place];  Service: Vascular;  Laterality: Right;   BIV PACEMAKER INSERTION CRT-P N/A 11/12/2018   Procedure: BIV PACEMAKER INSERTION CRT-P;  Surgeon: Evans Lance, MD;  Location: [place] CV LAB;  Service: Cardiovascular;  Laterality: N/A;   CHOLECYSTECTOMY N/A 10/25/2014   Procedure: LAPAROSCOPIC CHOLECYSTECTOMY ;  Surgeon: Coralie Keens, MD;  Location: [place];  Service: General;  Laterality: N/A;   CORONARY ANGIOPLASTY WITH STENT PLACEMENT  05-01-2006   OCCLUDED CIRCUMFLEX -- TAXUS STENT PLACMENT  AND RESIDUAL 80% LAD,  50% RCA   CYSTOSCOPY  07/30/2011   Procedure: CYSTOSCOPY;  Surgeon: Hanley Ben, MD;  Location: [place];  Service: Urology;  Laterality: N/A;   ERCP N/A 08/25/2014   Procedure: ENDOSCOPIC RETROGRADE CHOLANGIOPANCREATOGRAPHY (ERCP);  Surgeon: Ladene Artist, MD;  Location[facility] ENDOSCOPY;  Service: Endoscopy;  Laterality: N/A;   FEMORAL-POPLITEAL BYPASS GRAFT  03/11/2012   Procedure: BYPASS GRAFT FEMORAL-POPLITEAL ARTERY;  Surgeon: Conrad [place], MD;  Location: [place];  Service: Vascular;  Laterality: Left;  embolectomy left lower leg   FEMORAL-TIBIAL BYPASS GRAFT  03/11/2012   Procedure: BYPASS GRAFT FEMORAL-TIBIAL ARTERY;  Surgeon: Conrad [place], MD;  Location[facility] OR;  Service: Vascular;  Laterality: Left;  Left Femoral -Tibial trunk bypass,  Endarterectomy of Tibial- Peroneal trunk with vein angioplasty.   HERNIA REPAIR  1990   (R)   INTRAOPERATIVE ARTERIOGRAM  03/11/2012   Procedure: INTRA OPERATIVE ARTERIOGRAM;  Surgeon: Conrad Hartley, MD;  Location: [place];  Service: Vascular;  Laterality: Left;   LOWER EXTREMITY ANGIOGRAM Bilateral 12/17/2011   Procedure: LOWER EXTREMITY ANGIOGRAM;  Surgeon: Serafina Mitchell, MD;  Location[facility] CATH LAB;  Service: Cardiovascular;  Laterality: Bilateral;  bil lower extrem angio   THORACENTESIS  2008   PLEURAL EFFUSION   TRANSURETHRAL RESECTION OF PROSTATE  07/30/2011   Procedure: TRANSURETHRAL RESECTION OF THE PROSTATE (TURP);  Surgeon: Hanley Ben, MD;  Location: [facility];  Service: Urology;  Laterality: N/A;        Home Medications    Prior to Admission medications   Medication Sig Start Date End Date Taking? Authorizing Provider  albuterol (PROVENTIL HFA;VENTOLIN HFA) 108 (90 Base) MCG/ACT inhaler Inhale 2 puffs into the lungs  every 6 (six) hours as needed for wheezing or shortness of breath. 12/28/15   Dorothy Spark, MD  amoxicillin (AMOXIL) 500 MG tablet Take 4 tablets (2,000 mg) 30 minutes prior to dental procedure 12/28/18   Evans Lance, MD  aspirin EC 81 MG tablet Take 1 tablet (81 mg total) by mouth daily. 07/07/17   Dorothy Spark, MD  baclofen (LIORESAL) 10 MG tablet Take 0.5 tablets (5 mg total) by mouth 2 (two) times daily. 06/04/19   Pieter Partridge, DO  carvedilol (COREG) 3.125 MG tablet Take 1 tablet (3.125 mg total) by mouth 2 (two) times daily with a meal. 11/13/18   Baldwin Jamaica, PA-C  cetirizine (ZYRTEC) 10 MG tablet Take 10 mg by mouth daily. 10/19/18   [provider]  Cholecalciferol (VITAMIN D-3) 1000 units CAPS Take 2,000 Units by mouth daily.    [provider]  diclofenac sodium (VOLTAREN) 1 % GEL Apply 1 application topically 2 (two) times daily as needed (for pain).  03/09/12   Kirsteins, Luanna Salk, MD  furosemide  (LASIX) 40 MG tablet Take 40 mg by mouth 2 (two) times daily.    [provider]  glucagon (GLUCAGEN) 1 MG SOLR injection Inject 1 mg into the muscle once as needed for low blood sugar. Reported on 11/16/2015    [provider]  guaiFENesin (ROBITUSSIN) 100 MG/5ML SOLN Take 5 mLs by mouth every 4 (four) hours as needed for cough or to loosen phlegm.    [provider]  insulin aspart protamine - aspart (NOVOLOG 70/30 MIX) (70-30) 100 UNIT/ML FlexPen Inject 0.05 mLs (5 Units total) into the skin 2 (two) times daily. 03/12/17   [name], MD  lisinopril (PRINIVIL,ZESTRIL) 10 MG tablet TAKE 1 TABLET BY MOUTH EVERY DAY Patient taking differently: Take 10 mg by mouth daily.  05/26/17   [name], MD  mupirocin ointment (BACTROBAN) 2 % Apply 1 application topically as needed (rash on back).  02/16/19   [provider]  pantoprazole (PROTONIX) 40 MG tablet Take 1 tablet (40 mg total) by mouth daily with lunch. 12/22/17   Burns, Claudina Lick, MD  pregabalin (LYRICA) 150 MG capsule TAKE ONE CAPSULE BY MOUTH EVERY MORNING AND 2 CAPSULES EVERY NIGHT AT BEDTIME Patient taking differently: Take 150 mg by mouth See admin instructions. Tale 150 mg in the morning and 300 mg in the evening 02/02/19   Pieter Partridge, DO  Propylene Glycol (SYSTANE BALANCE OP) Place 1-2 drops into both eyes daily as needed (dry eyes).     [provider]  rosuvastatin (CRESTOR) 20 MG tablet TAKE 1 TABLET(20 MG) BY MOUTH DAILY Patient taking differently: Take 20 mg by mouth daily.  04/13/18   [name], MD  traMADol (ULTRAM) 50 MG tablet Take 1 tablet (50 mg total) by mouth every 6 (six) hours as needed (for pain). 06/04/19   Pieter Partridge, DO  traZODone (DESYREL) 100 MG tablet Take 100 mg by mouth at bedtime.  11/09/18   [provider]    Family History Family History  Problem Relation Age of Onset   Heart attack Mother 42   Heart disease Mother    Stroke Mother     Hyperlipidemia Mother    Hypertension Mother    COPD Father    Peripheral vascular disease Father    Diabetes Brother    Heart disease Brother    Hypertension Brother    Heart attack Brother  Diabetes Daughter    Colon cancer Neg Hx    Prostate cancer Neg Hx     Social History Social History   Tobacco Use   Smoking status: Former Smoker    Years: 50.00    Quit date: 07/22/2012    Years since quitting: 6.9   Smokeless tobacco: Current User   Tobacco comment: pt states that he is using E-cigs  Substance Use Topics   Alcohol use: No    Alcohol/week: 0.0 standard drinks   Drug use: No     Allergies   Bee venom, Influenza vaccines, Atorvastatin, and Latex   Review of Systems Review of Systems  Unable to perform ROS: Mental status change     Physical Exam Updated Vital Signs Ht 5\' 11"  (1.803 m)    Wt 67.4 kg Comment: per last visit   BMI 20.72 kg/m   Physical Exam Vitals signs and nursing note reviewed.  Constitutional:      Appearance: He is well-developed.  HENT:     Head: Normocephalic and atraumatic.     Right Ear: External ear normal.     Left Ear: External ear normal.     Mouth/Throat:     Mouth: Mucous membranes are moist.     Pharynx: No oropharyngeal exudate or posterior oropharyngeal erythema.     Comments: No oral or lingual angioedema. Eyes:     Conjunctiva/sclera: Conjunctivae normal.     Pupils: Pupils are equal, round, and reactive to light.  Neck:     Musculoskeletal: Normal range of motion and neck supple.     Trachea: Phonation normal.  Cardiovascular:     Rate and Rhythm: Normal rate and regular rhythm.     Heart sounds: Normal heart sounds.     Comments: He appears mildly fluid overloaded with generalized edema, and some periorbital edema. Pulmonary:     Effort: Pulmonary effort is normal. No respiratory distress.     Breath sounds: Normal breath sounds. No stridor. No rhonchi.  Abdominal:     Palpations: Abdomen is  soft.     Tenderness: There is no abdominal tenderness.  Musculoskeletal:     Comments: Bilateral above-knee amputations, without deformities.  Skin:    General: Skin is warm and dry.  Neurological:     Mental Status: He is alert.     Cranial Nerves: No cranial nerve deficit.     Motor: No abnormal muscle tone.     Coordination: Coordination normal.  Psychiatric:        Mood and Affect: Mood normal.        Behavior: Behavior normal.      ED Treatments / Results  Labs (all labs ordered are listed, but only abnormal results are displayed) Labs Reviewed  CBC - Abnormal; Notable for the following components:      Result Value   Hemoglobin 11.5 (*)    HCT 35.8 (*)    RDW 16.5 (*)    Platelets 135 (*)    All other components within normal limits  CULTURE, BLOOD (ROUTINE X 2)  CULTURE, BLOOD (ROUTINE X 2)  URINE CULTURE  BASIC METABOLIC PANEL  BRAIN NATRIURETIC PEPTIDE  URINALYSIS, ROUTINE W REFLEX MICROSCOPIC  TROPONIN I (HIGH SENSITIVITY)    EKG EKG Interpretation  Date/Time:  Friday June 25 2019 18:34:35 EST Ventricular Rate:  60 PR Interval:    QRS Duration: 132 QT Interval:  445 QTC Calculation: 445 R Axis:   -79 Text Interpretation: AV SEQUENTIAL PACEMAKER since last tracing  no significant change Confirmed by Daleen Bo [phone]) on 06/25/2019 7:20:47 PM   Radiology No results found.  Procedures .Critical Care Performed by: Daleen Bo, MD Authorized by: Daleen Bo, MD   Critical care provider statement:    Critical care time (minutes):  35   Critical care start time:  06/25/2019 6:45 PM   Critical care end time:  06/25/2019 11:56 PM   Critical care time was exclusive of:  Separately billable procedures and treating other patients   Critical care was necessary to treat or prevent imminent or life-threatening deterioration of the following conditions:  Circulatory failure   Critical care was time spent personally by me on the following  activities:  Blood draw for specimens, development of treatment plan with patient or surrogate, discussions with consultants, evaluation of patient's response to treatment, examination of patient, obtaining history from patient or surrogate, ordering and performing treatments and interventions, ordering and review of laboratory studies, pulse oximetry, re-evaluation of patient's condition, review of old charts and ordering and review of radiographic studies   (including critical care time)  Medications Ordered in ED Medications  lactated ringers bolus 1,000 mL (has no administration in time range)    And  lactated ringers bolus 1,000 mL (has no administration in time range)    And  lactated ringers bolus 250 mL (has no administration in time range)  ceFEPIme (MAXIPIME) 2 g in sodium chloride 0.9 % 100 mL IVPB (has no administration in time range)  metroNIDAZOLE (FLAGYL) IVPB 500 mg (has no administration in time range)  vancomycin (VANCOCIN) 1,250 mg in sodium chloride 0.9 % 250 mL IVPB (has no administration in time range)     Initial Impression / Assessment and Plan / ED Course  I have reviewed the triage vital signs and the nursing notes.  Pertinent labs & imaging results that were available during my care of the patient were reviewed by me and considered in my medical decision making (see chart for details).  Clinical Course as of Jun 25 2343  Fri Jun 25, 2019  2155 Normal except sodium low, CO2 low, glucose high, BUN high, creatinine high, calcium low, GFR low  Basic metabolic panel(!) [EW]  123XX123 Normal  Troponin I (High Sensitivity) [EW]  2156 Normal  Brain natriuretic peptide [EW]  2156 Normal except hemoglobin low  CBC(!) [EW]  2157 No infiltrate or CHF, interpreted by me  DG Chest [place] View [EW]  2207 Discussed with wife, Jaime [name].  She called EMS today because she was concerned about him retaining fluid, having dark urine and wheezing.  Also he has been a little more  confused than usual, recently.  She states he is eating okay, and sleeping a lot.   [EW]  2242 Are now 113/74, he remains alert and conversant.  Pending tests at this time include urinalysis, rectal temperature and lactate.   [EW]  2243 At this point primary diagnostic entity appears to be intravascular volume depletion, with mild renal insufficiency, creatinine increased from baseline.   [EW]  2327 Abnormal, presence of protein, leukocytes, white cells, bacteria, consistent with UTI.  Culture ordered.  Appearance(!): CLOUDY [EW]    Clinical Course User Index [EW] Daleen Bo, MD        Patient Vitals for the past 24 hrs:  Height Weight  06/25/19 1900 5\' 11"  (1.803 m) 67.4 kg    11:45 PM Reevaluation with update and discussion. After initial assessment and treatment, an updated evaluation reveals at this time blood pressure  is improved.  Clinical exam unchanged, he remains alert, and comfortable.  Patient appears to have urinary tract infection.  He will require hospitalization because of significant hypotension requiring IV fluid replacement.  Broad-spectrum antibiotics have begun. Daleen Bo   Medical Decision Making: The patient has been ill with weakness and dark urine. He has a UTI and requires admission for treatment of hypotension and uti. Doubt severe sepsis.  CRITICAL CARE- Yes Performed by: Daleen Bo  Nursing Notes Reviewed/ Care Coordinated Applicable Imaging Reviewed Interpretation of Laboratory Data incorporated into ED treatment  Admission arranged with Hospitalist   Final Clinical Impressions(s) / ED Diagnoses   Final diagnoses:  Hypotension due to hypovolemia  Urinary tract infection without hematuria, site unspecified    ED Discharge Orders    None       Daleen Bo, MD 06/25/19 2357

## 2019-06-25 NOTE — H&P (Signed)
History and Physical    DOC TEA [id number] DOB: Oct 10, 1940 DOA: 06/25/2019  PCP: [name], MD  Patient coming from: Home.  History obtained from patient and patient's wife and ER physician and previous records.  Chief Complaint: Weakness.  HPI: Jaime [name] is a 78 y.o. male with history of CAD status post stenting, chronic systolic heart failure last EF measured in 2019 was 30 to 35%, complete heart block status post pacemaker placement, diabetes mellitus type 2, multiple sclerosis progressive bedbound on baclofen bilateral AKA recently admitted for pneumonia and UTI in September 2020 was brought to the ER after patient has been feeling weak and has been having poor appetite has not had any vomiting but had some nausea no abdominal pain or diarrhea.  Over the last 2 days his symptoms have been ongoing and patient's wife decided to bring patient to the ER.  Patient wife also is concerned that patient may be having some fluid retention.  ED Course: On arrival patient was found to be hypotensive blood pressure in the 60s which improved with fluids.  UA is consistent with UTI.  Chest x-ray was unremarkable.  Blood cultures obtained and started on empiric antibiotics for sepsis.  COVID-19 test was negative.  Patient was afebrile in the ER.  Lab work show acute renal failure with creatinine 1.4 BNP was 66 hemoglobin around 11.5 platelets 135.  EKG shows paced rhythm.  Patient admitted for possible developing sepsis from UTI.  Review of Systems: As per HPI, rest all negative.   Past Medical History:  Diagnosis Date  . Atherosclerotic PVD with ulceration ([place])    left foot  . Bacteremia   . CAD (coronary artery disease) 2007   Dr Meda Coffee -  non-STEMI, .occluded circumflex.. Taxus stent placed...residual 80% LAD...50% RCA  . Cardiomyopathy, ischemic    EF 45% per ECHO 2008  //   EF 25%, echo, August, 2013 //  Echo (8/15):  Mild LVH, EF 30-35%, ant-lat and lat AK, inf HK, Gr  1 DD, mild MR, mild LAE  . Carotid artery disease ([place])    a.  Doppler, February, 2012, 0-39% bilateral,Mild smooth plaque;  b.  Carotid US (8/15):  Bilateral 1-39% ICA >>> F/u 2 years  . Constipation   . Dementia ([place])   . Diabetes mellitus    INSULIN-DEPENDent  . Fatigue SEVERE  . H/O pleural effusion 2008   POST THORACENTESIS  . History of colon polyps PRECANCEROUS  . Hyperlipidemia   . Hypertension   . Impotence   . Increased prostate specific antigen (PSA) velocity   . Insomnia   . Lung nodule    resolved 11-2006 CT Chest  . Multiple sclerosis ([place]) [place] -- LAST VISIT 11-20-2010  NOTE W/ CHART  . PAC (premature atrial contraction)    December, 2013  . Pulmonary hypertension ([place])    moderate ECHO Jan 2008  . Scoliosis associated with other condition   . Second degree Mobitz I AV block 11/12/2018  . Sleep apnea   . Symptomatic bradycardia 11/13/2018  . Systolic heart failure   . TIA (transient ischemic attack)   . Tobacco abuse    quit   . Urinary retention    dx ~ 2-12, like from [place], now with a catheter, saw urology  . Urinary tract infection    hx of    Past Surgical History:  Procedure Laterality Date  . ABDOMINAL ANGIOGRAM  12/17/2011  Procedure: ABDOMINAL ANGIOGRAM;  Surgeon: Serafina Mitchell, MD;  Location: [facility] CATH LAB;  Service: Cardiovascular;;  . ABDOMINAL AORTAGRAM N/A 08/19/2013   Procedure: ABDOMINAL Maxcine Ham;  Surgeon: Conrad [place], MD;  Location: [facility] CATH LAB;  Service: Cardiovascular;  Laterality: N/A;  . AMPUTATION  03/17/2012   Procedure: AMPUTATION ABOVE KNEE;  Surgeon: Conrad Diagonal, MD;  Location: [place];  Service: Vascular;  Laterality: Left;  . AMPUTATION  06/03/2012   Procedure: AMPUTATION ABOVE KNEE;  Surgeon: Conrad Lakeville, MD;  Location: [place];  Service: Vascular;  Laterality: Right;  . BIV PACEMAKER INSERTION CRT-P N/A 11/12/2018   Procedure: BIV PACEMAKER INSERTION CRT-P;  Surgeon: Evans Lance, MD;  Location:  [place] CV LAB;  Service: Cardiovascular;  Laterality: N/A;  . CHOLECYSTECTOMY N/A 10/25/2014   Procedure: LAPAROSCOPIC CHOLECYSTECTOMY ;  Surgeon: Coralie Keens, MD;  Location: [place];  Service: General;  Laterality: N/A;  . CORONARY ANGIOPLASTY WITH STENT PLACEMENT  05-01-2006   OCCLUDED CIRCUMFLEX -- TAXUS STENT PLACMENT  AND RESIDUAL 80% LAD,  50% RCA  . CYSTOSCOPY  07/30/2011   Procedure: CYSTOSCOPY;  Surgeon: Hanley Ben, MD;  Location: [facility];  Service: Urology;  Laterality: N/A;  . ERCP N/A 08/25/2014   Procedure: ENDOSCOPIC RETROGRADE CHOLANGIOPANCREATOGRAPHY (ERCP);  Surgeon: Ladene Artist, MD;  Location: [facility] ENDOSCOPY;  Service: Endoscopy;  Laterality: N/A;  . FEMORAL-POPLITEAL BYPASS GRAFT  03/11/2012   Procedure: BYPASS GRAFT FEMORAL-POPLITEAL ARTERY;  Surgeon: Conrad Sherman, MD;  Location: [place];  Service: Vascular;  Laterality: Left;  embolectomy left lower leg  . FEMORAL-TIBIAL BYPASS GRAFT  03/11/2012   Procedure: BYPASS GRAFT FEMORAL-TIBIAL ARTERY;  Surgeon: Conrad Nettie, MD;  Location[facility] OR;  Service: Vascular;  Laterality: Left;  Left Femoral -Tibial trunk bypass, Endarterectomy of Tibial- Peroneal trunk with vein angioplasty.  Marland Kitchen HERNIA REPAIR  1990   (R)  . INTRAOPERATIVE ARTERIOGRAM  03/11/2012   Procedure: INTRA OPERATIVE ARTERIOGRAM;  Surgeon: Conrad [place], MD;  Location: [place];  Service: Vascular;  Laterality: Left;  . LOWER EXTREMITY ANGIOGRAM Bilateral 12/17/2011   Procedure: LOWER EXTREMITY ANGIOGRAM;  Surgeon: Serafina Mitchell, MD;  Location: [facility] CATH LAB;  Service: Cardiovascular;  Laterality: Bilateral;  bil lower extrem angio  . THORACENTESIS  2008   PLEURAL EFFUSION  . TRANSURETHRAL RESECTION OF PROSTATE  07/30/2011   Procedure: TRANSURETHRAL RESECTION OF THE PROSTATE (TURP);  Surgeon: Hanley Ben, MD;  Location: [facility];  Service: Urology;  Laterality: N/A;     reports that he quit smoking about 6 years ago. He quit  after 50.00 years of use. He uses smokeless tobacco. He reports that he does not drink alcohol or use drugs.  Allergies  Allergen Reactions  . Bee Venom Anaphylaxis  . Influenza Vaccines Other (See Comments)    Made patient "sick for months"  . Atorvastatin Other (See Comments)    Severe pain in stumps  . Latex Rash    Family History  Problem Relation Age of Onset  . Heart attack Mother 43  . Heart disease Mother   . Stroke Mother   . Hyperlipidemia Mother   . Hypertension Mother   . COPD Father   . Peripheral vascular disease Father   . Diabetes Brother   . Heart disease Brother   . Hypertension Brother   . Heart attack Brother   . Diabetes Daughter   . Colon cancer Neg Hx   . Prostate cancer Neg Hx  Prior to Admission medications   Medication Sig Start Date End Date Taking? Authorizing Provider  albuterol (PROVENTIL HFA;VENTOLIN HFA) 108 (90 Base) MCG/ACT inhaler Inhale 2 puffs into the lungs every 6 (six) hours as needed for wheezing or shortness of breath. 12/28/15   Dorothy Spark, MD  amoxicillin (AMOXIL) 500 MG tablet Take 4 tablets (2,000 mg) 30 minutes prior to dental procedure 12/28/18   Evans Lance, MD  aspirin EC 81 MG tablet Take 1 tablet (81 mg total) by mouth daily. 07/07/17   Dorothy Spark, MD  baclofen (LIORESAL) 10 MG tablet Take 0.5 tablets (5 mg total) by mouth 2 (two) times daily. 06/04/19   Pieter Partridge, DO  carvedilol (COREG) 3.125 MG tablet Take 1 tablet (3.125 mg total) by mouth 2 (two) times daily with a meal. 11/13/18   Baldwin Jamaica, PA-C  cetirizine (ZYRTEC) 10 MG tablet Take 10 mg by mouth daily. 10/19/18   [provider]  Cholecalciferol (VITAMIN D-3) 1000 units CAPS Take 2,000 Units by mouth daily.    [provider]  diclofenac sodium (VOLTAREN) 1 % GEL Apply 1 application topically 2 (two) times daily as needed (for pain).  03/09/12   Kirsteins, Luanna Salk, MD  furosemide (LASIX) 40 MG tablet Take 40 mg by mouth  2 (two) times daily.    [provider]  glucagon (GLUCAGEN) 1 MG SOLR injection Inject 1 mg into the muscle once as needed for low blood sugar. Reported on 11/16/2015    [provider]  guaiFENesin (ROBITUSSIN) 100 MG/5ML SOLN Take 5 mLs by mouth every 4 (four) hours as needed for cough or to loosen phlegm.    [provider]  insulin aspart protamine - aspart (NOVOLOG 70/30 MIX) (70-30) 100 UNIT/ML FlexPen Inject 0.05 mLs (5 Units total) into the skin 2 (two) times daily. 03/12/17   [name], MD  lisinopril (PRINIVIL,ZESTRIL) 10 MG tablet TAKE 1 TABLET BY MOUTH EVERY DAY Patient taking differently: Take 10 mg by mouth daily.  05/26/17   [name], MD  mupirocin ointment (BACTROBAN) 2 % Apply 1 application topically as needed (rash on back).  02/16/19   [provider]  pantoprazole (PROTONIX) 40 MG tablet Take 1 tablet (40 mg total) by mouth daily with lunch. 12/22/17   Burns, Claudina Lick, MD  pregabalin (LYRICA) 150 MG capsule TAKE ONE CAPSULE BY MOUTH EVERY MORNING AND 2 CAPSULES EVERY NIGHT AT BEDTIME Patient taking differently: Take 150 mg by mouth See admin instructions. Tale 150 mg in the morning and 300 mg in the evening 02/02/19   Pieter Partridge, DO  Propylene Glycol (SYSTANE BALANCE OP) Place 1-2 drops into both eyes daily as needed (dry eyes).     [provider]  rosuvastatin (CRESTOR) 20 MG tablet TAKE 1 TABLET(20 MG) BY MOUTH DAILY Patient taking differently: Take 20 mg by mouth daily.  04/13/18   [name], MD  traMADol (ULTRAM) 50 MG tablet Take 1 tablet (50 mg total) by mouth every 6 (six) hours as needed (for pain). 06/04/19   Pieter Partridge, DO  traZODone (DESYREL) 100 MG tablet Take 100 mg by mouth at bedtime.  11/09/18   [provider]    Physical Exam: Constitutional: Moderately built and nourished. Vitals:   06/25/19 2300 06/25/19 2315 06/25/19 2325 06/25/19 2344  BP: 103/66 95/64 95/64    Pulse: (!) 111 64 60    Resp: 17 11 11    Temp:  97.7 F (36.5 C)  TempSrc:    Rectal  SpO2: 100% (!) 17% 100%   Weight:      Height:       Eyes: Nonicteric no pallor. ENMT: No discharge from the ears eyes nose or mouth. Neck: No mass or.  No neck rigidity. Respiratory: No rhonchi or crepitations. Cardiovascular: S1-S2 heard. Abdomen: Soft nontender bowel sounds present. Musculoskeletal: Bilateral AKA no edema. Skin: No rash. Neurologic: Alert awake oriented time place and person. Psychiatric: Appears normal.   Labs on Admission: I have personally reviewed following labs and imaging studies  CBC: Recent Labs  Lab 06/25/19 1846  WBC 5.0  HGB 11.5*  HCT 35.8*  MCV 83.1  PLT [id number]*   Basic Metabolic Panel: Recent Labs  Lab 06/25/19 1846  NA 133*  K 4.4  CL 104  CO2 19*  GLUCOSE 151*  BUN 31*  CREATININE 1.48*  CALCIUM 8.4*   GFR: Estimated Creatinine Clearance: 39.8 mL/min (A) (by C-G formula based on SCr of 1.48 mg/dL (H)). Liver Function Tests: No results for input(s): AST, ALT, ALKPHOS, BILITOT, PROT, ALBUMIN in the last 168 hours. No results for input(s): LIPASE, AMYLASE in the last 168 hours. No results for input(s): AMMONIA in the last 168 hours. Coagulation Profile: No results for input(s): INR, PROTIME in the last 168 hours. Cardiac Enzymes: No results for input(s): CKTOTAL, CKMB, CKMBINDEX, TROPONINI in the last 168 hours. BNP (last 3 results) No results for input(s): PROBNP in the last 8760 hours. HbA1C: No results for input(s): HGBA1C in the last 72 hours. CBG: No results for input(s): GLUCAP in the last 168 hours. Lipid Profile: No results for input(s): CHOL, HDL, LDLCALC, TRIG, CHOLHDL, LDLDIRECT in the last 72 hours. Thyroid Function Tests: No results for input(s): TSH, T4TOTAL, FREET4, T3FREE, THYROIDAB in the last 72 hours. Anemia Panel: No results for input(s): VITAMINB12, FOLATE, FERRITIN, TIBC, IRON, RETICCTPCT in the last 72 hours. Urine analysis:     Component Value Date/Time   COLORURINE YELLOW 06/25/2019 2303   APPEARANCEUR CLOUDY (A) 06/25/2019 2303   LABSPEC 1.010 06/25/2019 2303   PHURINE 5.0 06/25/2019 2303   GLUCOSEU NEGATIVE 06/25/2019 2303   GLUCOSEU NEGATIVE 04/29/2017 1211   HGBUR SMALL (A) 06/25/2019 2303   BILIRUBINUR NEGATIVE 06/25/2019 2303   KETONESUR NEGATIVE 06/25/2019 2303   PROTEINUR 30 (A) 06/25/2019 2303   UROBILINOGEN 0.2 04/29/2017 1211   NITRITE NEGATIVE 06/25/2019 2303   LEUKOCYTESUR LARGE (A) 06/25/2019 2303   Sepsis Labs: @LABRCNTIP (procalcitonin:4,lacticidven:4) )No results found for this or any previous visit (from the past 240 hour(s)).   Radiological Exams on Admission: Dg Chest Port 1 View  Result Date: 06/25/2019 CLINICAL DATA:  Hypotension. EXAM: PORTABLE CHEST 1 VIEW COMPARISON:  05/21/2019 FINDINGS: Left chest wall pacer is noted with leads in the right atrial appendage, right ventricle and coronary sinus. Normal heart size. No pleural effusion or edema. Lungs are clear. IMPRESSION: No active cardiopulmonary abnormalities. Electronically Signed   By: Kerby Moors M.D.   On: 06/25/2019 20:10    EKG: Independently reviewed.  Paced rhythm.  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 SIRS with possible developing sepsis secondary to UTI for which patient is  on empiric antibiotics follow cultures.  Continue hydration but hold antihypertensives and Lasix.  Follow lactic acid and procalcitonin. 2. Acute renal failure likely from poor oral intake and hypotension.  Likely sepsis also contributing to the symptoms.  Holding all antihypertensives Lasix continue to  hydrate. 3. Chronic systolic heart failure last EF measured was 30 to 35% presently holding Lasix and ACE inhibitor due to renal failure and hypotension and sepsis.  Receiving fluids. 4. History of complete heart block status post pacemaker placement. 5. History of CAD denies any chest pain will follow cardiac markers. 6. Peripheral vascular disease status post bilateral AKA. 7. History of multiple sclerosis bedbound on baclofen. 8. Anemia and thrombocytopenia appears to be chronic follow CBC.  Given the septic presentation will need more than 2 midnight stay in inpatient status.   DVT prophylaxis: Lovenox.  No other patient has thrombocytopenia. Code Status: Full code confirmed with patient's wife. Family Communication: Patient's wife. Disposition Plan: Home.  When stable. Consults called: None. Admission status: Inpatient.   Rise Patience MD [facility] Pager [phone].  If 7PM-7AM, please contact night-coverage [email or web address] Password [email or web address]  06/25/2019, 11:51 PM

## 2019-06-25 NOTE — Telephone Encounter (Signed)
Pt c/o swelling: STAT is pt has developed SOB within 24 hours  1) How much weight have you gained and in what time span? Wife is not sure  2) If swelling, where is the swelling located? All over his body  3) Are you currently taking a fluid pill? Yes 40 mg lasix daily  4) Are you currently SOB? Pt has some wheezing. Wife has been using his albuterol inhaler and wheezing seemed to calm it down  5) Do you have a log of your daily weights (if so, list)? No. Wife can not weigh the patient. The patient does not have legs, and he is bedridden.   6) Have you gained 3 pounds in a day or 5 pounds in a week? unsure  7) Have you traveled recently? No    The wife called and wanted to know what to do about the patient's swelling. The patient has been retaining fluid, and the urine that he does produce is dark. The wife has taken him to the hospital in the past, but the hospital hasn't done anything. She does not want to take him again and have the same result. She wanted to know if there was anything that Dr. Meda Coffee would recommend for her to try

## 2019-06-25 NOTE — Progress Notes (Signed)
Pharmacy Antibiotic Note  Jaime [name] is a 78 y.o. male admitted on 06/25/2019 with sepsis.  Pharmacy has been consulted for vancomycin and cefepime dosing. WBC is WNL. Scr is above baseline at 1.48.   Plan: Vancomycin 1250mg  IV x 1 then 1gm IV Q24H Cefepime 2gm IV Q12H F/u renal fxn, C&S, clinical status and peak/trough at SS  Height: 5\' 11"  (180.3 cm) Weight: 148 lb 9.4 oz (67.4 kg)(per last visit) IBW/kg (Calculated) : 75.3  No data recorded.  Recent Labs  Lab 06/25/19 1846  WBC 5.0    CrCl cannot be calculated (Patient's most recent lab result is older than the maximum 21 days allowed.).    Allergies  Allergen Reactions  . Bee Venom Anaphylaxis  . Influenza Vaccines Other (See Comments)    Made patient "sick for months"  . Atorvastatin Other (See Comments)    Severe pain in stumps  . Latex Rash    Antimicrobials this admission: Vanc 12/4>> Cefepime 12/4>> Flagyl x 1 12/4  Dose adjustments this admission: N/A  Microbiology results: Pending  Thank you for allowing pharmacy to be a part of this patient's care.  Dawsyn Zurn, Rande Lawman 06/25/2019 7:25 PM

## 2019-06-25 NOTE — Telephone Encounter (Signed)
Pts wife called to report that he has been having increased fluid and he is very swollen around his face, chest, abdomen.. she can hear him wheezing. He is not urinating. They had an MD... Dr Daphene Jaeger from [facility] visiting Doctors come to the house to see the pt about a month ago and he increased his lasix to 40 mg bid.. but she says that he continued to get worse.   She says that she has a call in to the visiting MD's.... she says they will come see him on the weekend but I advised her that it seems that he needs more than just PO meds.. he needs assessment and she should call EMS and she agreed.

## 2019-06-25 NOTE — ED Triage Notes (Signed)
Pt arrived via [facility] from home. Pt was referred to ED by [facility] for fluid retention and hypotension. Pt is alert and oriented at this time. EMs v/s 74/44, hr 60, rr 16, Sp02 94% ra.

## 2019-06-26 DIAGNOSIS — R6520 Severe sepsis without septic shock: Secondary | ICD-10-CM

## 2019-06-26 DIAGNOSIS — A419 Sepsis, unspecified organism: Principal | ICD-10-CM

## 2019-06-26 LAB — CBG MONITORING, ED
Glucose-Capillary: 110 mg/dL — ABNORMAL HIGH (ref 70–99)
Glucose-Capillary: 93 mg/dL (ref 70–99)
Glucose-Capillary: 89 mg/dL (ref 70–99)
Glucose-Capillary: 125 mg/dL — ABNORMAL HIGH (ref 70–99)
Glucose-Capillary: 99 mg/dL (ref 70–99)

## 2019-06-26 LAB — CBC
HCT: 33.5 % — ABNORMAL LOW (ref 39.0–52.0)
Hemoglobin: 10.2 g/dL — ABNORMAL LOW (ref 13.0–17.0)
MCH: 26.3 pg (ref 26.0–34.0)
MCHC: 30.4 g/dL (ref 30.0–36.0)
MCV: 86.3 fL (ref 80.0–100.0)
Platelets: 113 10*3/uL — ABNORMAL LOW (ref 150–400)
RBC: 3.88 MIL/uL — ABNORMAL LOW (ref 4.22–5.81)
RDW: 16.6 % — ABNORMAL HIGH (ref 11.5–15.5)
WBC: 4.7 10*3/uL (ref 4.0–10.5)
nRBC: 0 % (ref 0.0–0.2)

## 2019-06-26 LAB — BASIC METABOLIC PANEL
Anion gap: 7 (ref 5–15)
BUN: 23 mg/dL (ref 8–23)
CO2: 24 mmol/L (ref 22–32)
Calcium: 8.8 mg/dL — ABNORMAL LOW (ref 8.9–10.3)
Chloride: 109 mmol/L (ref 98–111)
Creatinine, Ser: 0.91 mg/dL (ref 0.61–1.24)
GFR calc Af Amer: >60 mL/min (ref >60)
GFR calc non Af Amer: >60 mL/min (ref >60)
Glucose, Bld: 143 mg/dL — ABNORMAL HIGH (ref 70–99)
Potassium: 4.3 mmol/L (ref 3.5–5.1)
Sodium: 140 mmol/L (ref 135–145)

## 2019-06-26 LAB — MRSA PCR SCREENING: MRSA by PCR: POSITIVE — AB

## 2019-06-26 LAB — LACTIC ACID, PLASMA
Lactic Acid, Venous: 5.9 mmol/L (ref 0.5–1.9)
Lactic Acid, Venous: 1.2 mmol/L (ref 0.5–1.9)

## 2019-06-26 LAB — HEPATIC FUNCTION PANEL
ALT: 10 U/L (ref 0–44)
AST: 12 U/L — ABNORMAL LOW (ref 15–41)
Albumin: 2.5 g/dL — ABNORMAL LOW (ref 3.5–5.0)
Alkaline Phosphatase: 41 U/L (ref 38–126)
Bilirubin, Direct: 0.1 mg/dL (ref 0.0–0.2)
Indirect Bilirubin: 0.4 mg/dL (ref 0.3–0.9)
Total Bilirubin: 0.5 mg/dL (ref 0.3–1.2)
Total Protein: 5.5 g/dL — ABNORMAL LOW (ref 6.5–8.1)

## 2019-06-26 LAB — TROPONIN I (HIGH SENSITIVITY): Troponin I (High Sensitivity): 8 ng/L (ref <18)

## 2019-06-26 LAB — PROCALCITONIN: Procalcitonin: 0.2 ng/mL

## 2019-06-26 LAB — GLUCOSE, CAPILLARY
Glucose-Capillary: 114 mg/dL — ABNORMAL HIGH (ref 70–99)
Glucose-Capillary: 179 mg/dL — ABNORMAL HIGH (ref 70–99)

## 2019-06-26 LAB — CREATININE, SERUM
Creatinine, Ser: 1.05 mg/dL (ref 0.61–1.24)
GFR calc Af Amer: >60 mL/min (ref >60)
GFR calc non Af Amer: >60 mL/min (ref >60)

## 2019-06-26 LAB — HEMOGLOBIN A1C
Hgb A1c MFr Bld: 7.7 % — ABNORMAL HIGH (ref 4.8–5.6)
Mean Plasma Glucose: 174.29 mg/dL

## 2019-06-26 LAB — SARS CORONAVIRUS 2 (TAT 6-24 HRS): SARS Coronavirus 2: NEGATIVE

## 2019-06-26 MED ORDER — SODIUM CHLORIDE 0.9 % IV BOLUS
1500.0000 mL | Freq: Once | INTRAVENOUS | Status: AC
  Administered 2019-06-26: 1500 mL via INTRAVENOUS

## 2019-06-26 MED ORDER — SODIUM CHLORIDE 0.9 % IV BOLUS (SEPSIS): 500.0000 mL | Freq: Once | INTRAVENOUS | Status: DC

## 2019-06-26 MED ORDER — INSULIN ASPART 100 UNIT/ML ~~LOC~~ SOLN
0.0000 [IU] | Freq: Three times a day (TID) | SUBCUTANEOUS | Status: DC
  Administered 2019-06-27: 12:00:00 1 [IU] via SUBCUTANEOUS
  Administered 2019-06-27: 17:00:00 2 [IU] via SUBCUTANEOUS
  Administered 2019-06-28 (×2): 1 [IU] via SUBCUTANEOUS
  Administered 2019-06-28: 18:00:00 2 [IU] via SUBCUTANEOUS
  Administered 2019-06-29: 13:00:00 3 [IU] via SUBCUTANEOUS

## 2019-06-26 MED ORDER — METRONIDAZOLE IN NACL 5-0.79 MG/ML-% IV SOLN
500.0000 mg | Freq: Three times a day (TID) | INTRAVENOUS | Status: DC
  Administered 2019-06-26: 07:00:00 500 mg via INTRAVENOUS
  Filled 2019-06-26: qty 100

## 2019-06-26 MED ORDER — SODIUM CHLORIDE 0.9 % IV SOLN
INTRAVENOUS | Status: DC
  Administered 2019-06-26 (×2): via INTRAVENOUS

## 2019-06-26 MED ORDER — SODIUM CHLORIDE 0.9 % IV BOLUS (SEPSIS): 1000.0000 mL | Freq: Once | INTRAVENOUS | Status: DC

## 2019-06-26 NOTE — Progress Notes (Signed)
PROGRESS NOTE    Jaime [name]  [id number] DOB: Jun 16, 1941 DOA: 06/25/2019 PCP: [name], MD     Brief Narrative:  Jaime [name] is a 78 y.o. male with history of CAD status post stenting, chronic systolic heart failure last EF in 2019 was 30 to 35%, complete heart block status post pacemaker placement, diabetes mellitus type 2, multiple sclerosis progressive bedbound on baclofen bilateral AKA recently admitted for pneumonia and UTI in September 2020 was brought to the ER after patient has been feeling weak and has been having poor appetite.  In the emergency department, patient was found to be hypotensive with SBP in the 60s.  UA was consistent with UTI and patient was started on broad-spectrum antibiotics.  New events last 24 hours / Subjective: Feeling better this morning, cannot really tell me any specific symptoms he is having today.  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  De-escalate to cefepime while awaiting urine culture result -Blood pressure improved this morning 147/82 -Repeat lactic acid pending  AKI -Likely prerenal in setting of sepsis  -Continue IV fluid  -Repeat BMP pending  Chronic systolic heart failure -EF 30 to 35% -BNP 66  -Holding Lasix and lisinopril due to AKI and hypotension -Continue to monitor volume status  Multiple sclerosis, remains bedbound at baseline -Continue baclofen  Diabetes mellitus type 2, well controlled -Check hemoglobin A1c -Sliding-scale insulin  History of complete heart block status post pacemaker History of CAD History of peripheral vascular disease status post bilateral AKA -Stable  -Trop negative  -Continue aspirin, crestor    DVT prophylaxis: Lovenox Code Status: Full Family Communication: None at bedside Disposition Plan: Pending clinical improvement   Consultants:   None  Procedures:   None   Antimicrobials:  Anti-infectives (From admission, onward)   Start     Dose/Rate Route Frequency Ordered Stop   06/26/19 1000  ceFEPIme (MAXIPIME) 2 g in sodium chloride 0.9 % 100 mL IVPB     2 g 200 mL/hr over 30 Minutes Intravenous Every 12 hours 06/25/19 1953     06/26/19 1000  vancomycin (VANCOCIN) IVPB 1000 mg/200 mL premix  Status:  Discontinued     1,000 mg 200 mL/hr over 60 Minutes Intravenous Every 24 hours 06/25/19 1953 06/26/19 0740   06/26/19 0645  metroNIDAZOLE (FLAGYL) IVPB 500 mg  Status:  Discontinued     500 mg 100 mL/hr over 60 Minutes Intravenous Every 8 hours 06/26/19 0631 06/26/19 0740   06/25/19 1930  ceFEPIme (MAXIPIME) 2 g in sodium chloride 0.9 % 100 mL IVPB     2 g 200 mL/hr over 30 Minutes Intravenous  Once 06/25/19 1918 06/25/19 2330   06/25/19 1930  metroNIDAZOLE (FLAGYL) IVPB 500  mg     500 mg 100 mL/hr over 60 Minutes Intravenous  Once 06/25/19 1918 06/25/19 2220   06/25/19 1930  vancomycin (VANCOCIN) IVPB 1000 mg/200 mL premix  Status:  Discontinued     1,000 mg 200 mL/hr over 60 Minutes Intravenous  Once 06/25/19 1918 06/25/19 1925   06/25/19 1930  vancomycin (VANCOCIN) 1,250 mg in sodium chloride 0.9 % 250 mL IVPB     1,250 mg  166.7 mL/hr over 90 Minutes Intravenous  Once 06/25/19 1925 06/26/19 0256        Objective: Vitals:   06/26/19 0740 06/26/19 0745 06/26/19 0800 06/26/19 0850  BP:  135/71 102/67 99/77  Pulse: 60   60  Resp: 12   13  Temp:      TempSrc:      SpO2: 100%   97%  Weight:      Height:        Intake/Output Summary (Last 24 hours) at 06/26/2019 1128 Last data filed at 06/26/2019 0747 Gross per 24 hour  Intake 2100 ml  Output 2050 ml  Net 50 ml   Filed Weights   06/25/19 1900  Weight: 67.4 kg    Examination:  General exam: Appears calm and comfortable  Respiratory system: Clear to auscultation. Respiratory effort normal. No respiratory distress. No conversational dyspnea.  Cardiovascular system: S1 & S2 heard, RRR. No murmurs.  Gastrointestinal system: Abdomen is nondistended, soft and nontender. Normal bowel sounds heard. Central nervous system: Alert. No focal neurological deficits. Speech clear.  Extremities: Bilateral AKA  Skin: No rashes, lesions or ulcers on exposed skin   Data Reviewed: I have personally reviewed following labs and imaging studies  CBC: Recent Labs  Lab 06/25/19 1846 06/26/19 0033  WBC 5.0 4.7  HGB 11.5* 10.2*  HCT 35.8* 33.5*  MCV 83.1 86.3  PLT 135* 123456*   Basic Metabolic Panel: Recent Labs  Lab 06/25/19 1846 06/26/19 0033  NA 133*  --   K 4.4  --   CL 104  --   CO2 19*  --   GLUCOSE 151*  --   BUN 31*  --   CREATININE 1.48* 1.05  CALCIUM 8.4*  --    GFR: Estimated Creatinine Clearance: 56.2 mL/min (by C-G formula based on SCr of 1.05 mg/dL). Liver Function Tests: Recent Labs  Lab 06/26/19 0033  AST 12*  ALT 10  ALKPHOS 41  BILITOT 0.5  PROT 5.5*  ALBUMIN 2.5*   No results for input(s): LIPASE, AMYLASE in the last 168 hours. No results for input(s): AMMONIA in the last 168 hours. Coagulation Profile: No results for input(s): INR, PROTIME in the last 168 hours. Cardiac Enzymes: No results for input(s): CKTOTAL, CKMB,  CKMBINDEX, TROPONINI in the last 168 hours. BNP (last 3 results) No results for input(s): PROBNP in the last 8760 hours. HbA1C: No results for input(s): HGBA1C in the last 72 hours. CBG: Recent Labs  Lab 06/26/19 0140 06/26/19 0611 06/26/19 0730  GLUCAP 110* 93 89   Lipid Profile: No results for input(s): CHOL, HDL, LDLCALC, TRIG, CHOLHDL, LDLDIRECT in the last 72 hours. Thyroid Function Tests: No results for input(s): TSH, T4TOTAL, FREET4, T3FREE, THYROIDAB in the last 72 hours. Anemia Panel: No results for input(s): VITAMINB12, FOLATE, FERRITIN, TIBC, IRON, RETICCTPCT in the last 72 hours. Sepsis Labs: Recent Labs  Lab 06/26/19 0033  PROCALCITON 0.20  LATICACIDVEN 5.9*    Recent Results (from the past 240 hour(s))  SARS CORONAVIRUS 2 (TAT 6-24 HRS) Nasopharyngeal Nasopharyngeal Swab  Status: None   Collection Time: 06/25/19 11:45 PM   Specimen: Nasopharyngeal Swab  Result Value Ref Range Status   SARS Coronavirus 2 NEGATIVE NEGATIVE Final    Comment: (NOTE) SARS-CoV-2 target nucleic acids are NOT DETECTED. The SARS-CoV-2 RNA is generally detectable in upper and lower respiratory specimens during the acute phase of infection. Negative results do not preclude SARS-CoV-2 infection, do not rule out co-infections with other pathogens, and should not be used as the sole basis for treatment or other patient management decisions. Negative results must be combined with clinical observations, patient history, and epidemiological information. The expected result is Negative. Fact Sheet for Patients: SugarRoll.be Fact Sheet for Healthcare Providers: https://www.woods-mathews.com/ This test is not yet approved or cleared by the [place] FDA and  has been authorized for detection and/or diagnosis of SARS-CoV-2 by FDA under an Emergency Use Authorization (EUA). This EUA will remain  in effect (meaning this test can be used) for the  duration of the COVID-19 declaration under Section 56 4(b)(1) of the Act, 21 U.S.C. section 360bbb-3(b)(1), unless the authorization is terminated or revoked sooner. Performed at [facility], [place] [address] [place],  [address]       Radiology Studies: Dg Chest Port 1 View  Result Date: 06/25/2019 CLINICAL DATA:  Hypotension. EXAM: PORTABLE CHEST 1 VIEW COMPARISON:  05/21/2019 FINDINGS: Left chest wall pacer is noted with leads in the right atrial appendage, right ventricle and coronary sinus. Normal heart size. No pleural effusion or edema. Lungs are clear. IMPRESSION: No active cardiopulmonary abnormalities. Electronically Signed   By: Kerby Moors M.D.   On: 06/25/2019 20:10      Scheduled Meds: . aspirin EC  81 mg Oral Daily  . baclofen  5 mg Oral BID  . enoxaparin (LOVENOX) injection  40 mg Subcutaneous Daily  . insulin aspart  0-9 Units Subcutaneous Q4H  . pantoprazole  40 mg Oral Q lunch  . pregabalin  150 mg Oral Daily   And  . pregabalin  300 mg Oral QHS  . rosuvastatin  20 mg Oral Daily   Continuous Infusions: . ceFEPime (MAXIPIME) IV       LOS: 1 day      Time spent: 35 minutes   Dessa Phi, DO [facility] 06/26/2019, 11:28 AM   Available via Epic secure chat 7am-7pm After these hours, please refer to coverage provider listed on [email or web address]

## 2019-06-26 NOTE — ED Notes (Signed)
SDU ordered bfast 

## 2019-06-26 NOTE — Plan of Care (Signed)
  Problem: Education: Goal: Knowledge of General Education information will improve Description Including pain rating scale, medication(s)/side effects and non-pharmacologic comfort measures Outcome: Progressing   

## 2019-06-26 NOTE — ED Triage Notes (Signed)
PT Pulled Mid line out. Pt is A/O  x2 .

## 2019-06-26 NOTE — ED Notes (Signed)
Pt moved up and repositioned in the bed with assistance of second RN

## 2019-06-26 NOTE — ED Provider Notes (Signed)
Ultrasound ED Peripheral IV (Provider)  Date/Time: 06/26/2019 2:51 PM Performed by: Maudie Flakes, MD Authorized by: Maudie Flakes, MD   Procedure details:    Indications: poor IV access     Skin Prep: chlorhexidine gluconate     Location: Left basilic vein.   Angiocath:  20 G   Bedside Ultrasound Guided: Yes     Patient tolerated procedure without complications: Yes     Dressing applied: Yes        Maudie Flakes, MD 06/26/19 1451

## 2019-06-26 NOTE — ED Notes (Signed)
Assisted pt to eat his breakfast.

## 2019-06-27 ENCOUNTER — Telehealth: Payer: Self-pay | Admitting: Internal Medicine

## 2019-06-27 LAB — BASIC METABOLIC PANEL
Anion gap: 8 (ref 5–15)
BUN: 22 mg/dL (ref 8–23)
CO2: 22 mmol/L (ref 22–32)
Calcium: 8.6 mg/dL — ABNORMAL LOW (ref 8.9–10.3)
Chloride: 110 mmol/L (ref 98–111)
Creatinine, Ser: 0.73 mg/dL (ref 0.61–1.24)
GFR calc Af Amer: >60 mL/min (ref >60)
GFR calc non Af Amer: >60 mL/min (ref >60)
Glucose, Bld: 129 mg/dL — ABNORMAL HIGH (ref 70–99)
Potassium: 4.5 mmol/L (ref 3.5–5.1)
Sodium: 140 mmol/L (ref 135–145)

## 2019-06-27 LAB — CBC
HCT: 36.1 % — ABNORMAL LOW (ref 39.0–52.0)
Hemoglobin: 11.5 g/dL — ABNORMAL LOW (ref 13.0–17.0)
MCH: 26.2 pg (ref 26.0–34.0)
MCHC: 31.9 g/dL (ref 30.0–36.0)
MCV: 82.2 fL (ref 80.0–100.0)
Platelets: 140 10*3/uL — ABNORMAL LOW (ref 150–400)
RBC: 4.39 MIL/uL (ref 4.22–5.81)
RDW: 16.5 % — ABNORMAL HIGH (ref 11.5–15.5)
WBC: 5.1 10*3/uL (ref 4.0–10.5)
nRBC: 0 % (ref 0.0–0.2)

## 2019-06-27 LAB — GLUCOSE, CAPILLARY
Glucose-Capillary: 108 mg/dL — ABNORMAL HIGH (ref 70–99)
Glucose-Capillary: 147 mg/dL — ABNORMAL HIGH (ref 70–99)
Glucose-Capillary: 175 mg/dL — ABNORMAL HIGH (ref 70–99)
Glucose-Capillary: 154 mg/dL — ABNORMAL HIGH (ref 70–99)

## 2019-06-27 MED ORDER — MUPIROCIN 2 % EX OINT
TOPICAL_OINTMENT | Freq: Two times a day (BID) | CUTANEOUS | Status: DC
  Administered 2019-06-27: 22:00:00 via NASAL
  Administered 2019-06-27 – 2019-06-28 (×2): 1 via NASAL
  Administered 2019-06-28 – 2019-06-29 (×2): via NASAL
  Filled 2019-06-27: qty 22

## 2019-06-27 MED ORDER — LISINOPRIL 10 MG PO TABS
10.0000 mg | ORAL_TABLET | Freq: Every day | ORAL | Status: DC
  Administered 2019-06-27 – 2019-06-29 (×3): 10 mg via ORAL
  Filled 2019-06-27 (×3): qty 1

## 2019-06-27 NOTE — Telephone Encounter (Signed)
Copied from [place] [phone]. Topic: General - Inquiry >> Jun 25, 2019  4:17 PM Percell Belt A wrote: Reason for CRM: wife called and stated that every since pt had his pacemaker put in , he is retaining a lot of fluid.  She stated that his urine is a little dark.  Pt tells wife that he is ok and he is not going to the ER and he is alright but wire is concerned about urine and is concerned about the fluid.  Pt has been sleeping a lot more. Declines triage and just wanted to see if dr Quay Burow would advise  Best number [phone] >> Jun 25, 2019  4:57 PM Para Skeans A wrote: Jonelle Sidle is aware of this.

## 2019-06-27 NOTE — Telephone Encounter (Signed)
Patient currently in hospital.

## 2019-06-27 NOTE — Progress Notes (Signed)
PROGRESS NOTE    Jaime [name]  [id number] DOB: [birth year] DOA: 06/25/2019 PCP: [name], MD     Brief Narrative:  Jaime [name] is a 78 y.o. male with history of CAD status post stenting, chronic systolic heart failure last EF in 2019 was 30 to 35%, complete heart block status post pacemaker placement, diabetes mellitus type 2, multiple sclerosis progressive bedbound on baclofen bilateral AKA recently admitted for pneumonia and UTI in September 2020 was brought to the ER after patient has been feeling weak and has been having poor appetite.  In the emergency department, patient was found to be hypotensive with SBP in the 60s.  UA was consistent with UTI and patient was started on broad-spectrum antibiotics.  New events last 24 hours / Subjective: No complaints this morning, states he is tired but doing well overall.  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  De-escalate to cefepime while awaiting urine culture result -Blood pressure improved -Repeat lactic acid improved   AKI -Likely prerenal in setting of sepsis -Resolved. Stop IVF    Chronic systolic heart failure -EF 30 to 35% -BNP 66  -Resume lisinopril, cont to hold lasix 1 more day  -Continue to monitor volume status  Multiple sclerosis, remains bedbound at baseline -Continue baclofen  Diabetes mellitus type 2, well controlled -Check hemoglobin A1c -Sliding-scale insulin  History of complete heart block status post pacemaker History of CAD History of peripheral vascular disease status post bilateral AKA -Stable  -Trop negative  -Continue aspirin, crestor    DVT prophylaxis: Lovenox Code Status: Full Family Communication: None at bedside; discussed with wife over the phone this morning Disposition Plan: Pending clinical improvement.  Await urine culture result.  Hopefully home 12/7   Consultants:   None  Procedures:   None   Antimicrobials:  Anti-infectives (From admission, onward)   Start     Dose/Rate Route Frequency Ordered Stop   06/26/19 1000  ceFEPIme (MAXIPIME) 2 g in sodium chloride 0.9 % 100 mL IVPB     2 g 200 mL/hr over 30 Minutes Intravenous Every 12 hours 06/25/19 1953     06/26/19 1000  vancomycin (VANCOCIN) IVPB 1000 mg/200 mL premix  Status:  Discontinued     1,000 mg 200 mL/hr over 60 Minutes Intravenous Every 24 hours 06/25/19 1953 06/26/19 0740   06/26/19 0645  metroNIDAZOLE (FLAGYL) IVPB 500 mg  Status:  Discontinued     500 mg 100 mL/hr over 60 Minutes Intravenous Every 8 hours 06/26/19 0631 06/26/19 0740   06/25/19 1930  ceFEPIme (MAXIPIME) 2 g in sodium chloride 0.9 % 100 mL IVPB     2 g 200 mL/hr over 30 Minutes Intravenous  Once 06/25/19 1918 06/25/19 2330   06/25/19 1930  metroNIDAZOLE (FLAGYL) IVPB 500 mg     500 mg 100 mL/hr over 60 Minutes Intravenous  Once 06/25/19 1918 06/25/19 2220   06/25/19 1930  vancomycin (VANCOCIN) IVPB 1000 mg/200 mL premix  Status:  Discontinued     1,000 mg 200 mL/hr over 60 Minutes Intravenous  Once 06/25/19 1918 06/25/19 1925   06/25/19 1930  vancomycin (VANCOCIN) 1,250 mg in  sodium chloride 0.9 % 250 mL IVPB     1,250 mg 166.7 mL/hr over 90 Minutes Intravenous  Once 06/25/19 1925 06/26/19 0256       Objective: Vitals:   06/26/19 1931 06/26/19 2339 06/27/19 0226 06/27/19 0729  BP: (!) 146/75 129/61 (!) 170/87 136/71  Pulse: 74 60 (!) 7 60  Resp: 14 14 18 17   Temp: 97.7 F (36.5 C) 98.2 F (36.8 C) 98.8 F (37.1 C) 97.9 F (36.6 C)  TempSrc: Oral Oral Oral Oral  SpO2: 98% 99% 98% 98%  Weight:      Height:        Intake/Output Summary (Last 24 hours) at 06/27/2019 0917 Last data filed at 06/27/2019 0314 Gross per 24 hour  Intake 2267.49 ml  Output 1300 ml  Net 967.49 ml   Filed Weights   06/25/19 1900 06/26/19 1554  Weight: 67.4 kg 66.3 kg    Examination: General exam: Appears calm and comfortable  Respiratory system: Clear to auscultation. Respiratory effort normal. Cardiovascular system: S1 & S2 heard, RRR. No pedal edema.  Gastrointestinal system: Abdomen is nondistended, soft and nontender. Normal bowel sounds heard. Central nervous system: Alert Extremities: Bilateral AKA  Skin: No rashes, lesions or ulcers on exposed skin  Psychiatry: Judgement and insight appear stable. Mood & affect appropriate.     Data Reviewed: I have personally reviewed following labs and imaging studies  CBC: Recent Labs  Lab 06/25/19 1846 06/26/19 0033 06/27/19 0220  WBC 5.0 4.7 5.1  HGB 11.5* 10.2* 11.5*  HCT 35.8* 33.5* 36.1*  MCV 83.1 86.3 82.2  PLT 135* 113* [id number]*   Basic Metabolic Panel: Recent Labs  Lab 06/25/19 1846 06/26/19 0033 06/26/19 1453 06/27/19 0220  NA 133*  --  140 140  K 4.4  --  4.3 4.5  CL 104  --  109 110  CO2 19*  --  24 22  GLUCOSE 151*  --  143* 129*  BUN 31*  --  23 22  CREATININE 1.48* 1.05 0.91 0.73  CALCIUM 8.4*  --  8.8* 8.6*   GFR: Estimated Creatinine Clearance: 72.5 mL/min (by C-G formula based on SCr of 0.73 mg/dL). Liver Function Tests: Recent Labs  Lab 06/26/19 0033  AST 12*  ALT 10  ALKPHOS 41   BILITOT 0.5  PROT 5.5*  ALBUMIN 2.5*   No results for input(s): LIPASE, AMYLASE in the last 168 hours. No results for input(s): AMMONIA in the last 168 hours. Coagulation Profile: No results for input(s): INR, PROTIME in the last 168 hours. Cardiac Enzymes: No results for input(s): CKTOTAL, CKMB, CKMBINDEX, TROPONINI in the last 168 hours. BNP (last 3 results) No results for input(s): PROBNP in the last 8760 hours. HbA1C: Recent Labs    06/26/19 1453  HGBA1C 7.7*   CBG: Recent Labs  Lab 06/26/19 1237 06/26/19 1341 06/26/19 1704 06/26/19 2114 06/27/19 0556  GLUCAP 125* 99 114* 179* 108*   Lipid Profile: No results for input(s): CHOL, HDL, LDLCALC, TRIG, CHOLHDL, LDLDIRECT in the last 72 hours. Thyroid Function  Tests: No results for input(s): TSH, T4TOTAL, FREET4, T3FREE, THYROIDAB in the last 72 hours. Anemia Panel: No results for input(s): VITAMINB12, FOLATE, FERRITIN, TIBC, IRON, RETICCTPCT in the last 72 hours. Sepsis Labs: Recent Labs  Lab 06/26/19 0033 06/26/19 1657  PROCALCITON 0.20  --   LATICACIDVEN 5.9* 1.2    Recent Results (from the past 240 hour(s))  Urine culture     Status: Abnormal (Preliminary result)   Collection Time: 06/25/19 11:03 PM   Specimen: In/Out Cath Urine  Result Value Ref Range Status   Specimen Description IN/OUT CATH URINE  Final   Special Requests NONE  Final   Culture (A)  Final    >=100,000 COLONIES/mL GRAM NEGATIVE RODS CULTURE REINCUBATED FOR BETTER GROWTH Performed at [facility], [place] [address], [place], [place] [address]    Report Status PENDING  Incomplete  SARS CORONAVIRUS 2 (TAT 6-24 HRS) Nasopharyngeal Nasopharyngeal Swab     Status: None   Collection Time: 06/25/19 11:45 PM   Specimen: Nasopharyngeal Swab  Result Value Ref Range Status   SARS Coronavirus 2 NEGATIVE NEGATIVE Final    Comment: (NOTE) SARS-CoV-2 target nucleic acids are NOT DETECTED. The SARS-CoV-2 RNA is generally detectable in upper and  lower respiratory specimens during the acute phase of infection. Negative results do not preclude SARS-CoV-2 infection, do not rule out co-infections with other pathogens, and should not be used as the sole basis for treatment or other patient management decisions. Negative results must be combined with clinical observations, patient history, and epidemiological information. The expected result is Negative. Fact Sheet for Patients: SugarRoll.be Fact Sheet for Healthcare Providers: https://www.woods-mathews.com/ This test is not yet approved or cleared by the [place] FDA and  has been authorized for detection and/or diagnosis of SARS-CoV-2 by FDA under an Emergency Use Authorization (EUA). This EUA will remain  in effect (meaning this test can be used) for the duration of the COVID-19 declaration under Section 56 4(b)(1) of the Act, 21 U.S.C. section 360bbb-3(b)(1), unless the authorization is terminated or revoked sooner. Performed at [facility], [place] [address], [place], [place] [address]   Blood Culture (routine x 2)     Status: None (Preliminary result)   Collection Time: 06/26/19 12:14 AM   Specimen: BLOOD  Result Value Ref Range Status   Specimen Description BLOOD LEFT HAND  Final   Special Requests   Final    BOTTLES DRAWN AEROBIC ONLY Blood Culture results may not be optimal due to an inadequate volume of blood received in culture bottles   Culture   Final    NO GROWTH 1 DAY Performed at [facility], [place] [address] [place], [place] [address]    Report Status PENDING  Incomplete  Blood Culture (routine x 2)     Status: None (Preliminary result)   Collection Time: 06/26/19 12:33 AM   Specimen: BLOOD  Result Value Ref Range Status   Specimen Description BLOOD UPPER ARM  Final   Special Requests   Final    BOTTLES DRAWN AEROBIC ONLY Blood Culture adequate volume   Culture   Final    NO GROWTH 1 DAY Performed  at [facility], [place] [address] [place], [place] [address]    Report Status PENDING  Incomplete  MRSA PCR Screening     Status: Abnormal   Collection Time: 06/26/19  3:50 PM   Specimen: Nasal Mucosa; Nasopharyngeal  Result Value Ref Range Status   MRSA by PCR  POSITIVE (A) NEGATIVE Final    Comment:        The GeneXpert MRSA Assay (FDA approved for NASAL specimens only), is one component of a comprehensive MRSA colonization surveillance program. It is not intended to diagnose MRSA infection nor to guide or monitor treatment for MRSA infections. RESULT CALLED TO, READ BACK BY AND VERIFIED WITH: RN Gordy Clement [id number] Performed at [place] [address] [place], [place] [address]       Radiology Studies: Dg Chest Port 1 View  Result Date: 06/25/2019 CLINICAL DATA:  Hypotension. EXAM: PORTABLE CHEST 1 VIEW COMPARISON:  05/21/2019 FINDINGS: Left chest wall pacer is noted with leads in the right atrial appendage, right ventricle and coronary sinus. Normal heart size. No pleural effusion or edema. Lungs are clear. IMPRESSION: No active cardiopulmonary abnormalities. Electronically Signed   By: Kerby Moors M.D.   On: 06/25/2019 20:10      Scheduled Meds:  aspirin EC  81 mg Oral Daily   baclofen  5 mg Oral BID   enoxaparin (LOVENOX) injection  40 mg Subcutaneous Daily   insulin aspart  0-9 Units Subcutaneous TID WC   pantoprazole  40 mg Oral Q lunch   pregabalin  150 mg Oral Daily   And   pregabalin  300 mg Oral QHS   rosuvastatin  20 mg Oral Daily   Continuous Infusions:  ceFEPime (MAXIPIME) IV Stopped (06/26/19 2230)     LOS: 2 days      Time spent: 25 minutes   Dessa Phi, DO [facility] 06/27/2019, 9:17 AM   Available via Epic secure chat 7am-7pm After these hours, please refer to coverage provider listed on [email or web address]

## 2019-06-27 NOTE — Progress Notes (Signed)
Pt bp elevated to 175/85 (112). [facility] coverage notified. Will continue to monitor.

## 2019-06-27 NOTE — Evaluation (Signed)
Occupational Therapy Evaluation Patient Details Name: Jaime [name] MRN: [id number] DOB: 1941/07/02 Today's Date: 06/27/2019    History of Present Illness 78 yo male presenting with ED with weakness and decreased appetite. In ED, pt found to be hypotensive with SBP in 60s. UA was consistent with UTI and patient was started on broad-spectrum antibiotics. PMH including CAD s/p stenting, chronic systolic heart failure,  complete heart block s/p pacemaker placement, DM type 2, MS progressive and bedbound, bilateral AKA, and recent admission for pneumonia and UTI (Sept 2020).   Clinical Impression   PTA, pt was living with his wife and had two PCA that assisted him with BADLs and hoyer lift to w/c (for appointments); information collected from wife via phone call. Pt currently requiring Max-Total A for bathing, dressing, and toileting at bed level, which is close to his baseline function. Pt also presenting with decreased cognition and orientation (stating he is in [place], asking the OT how her sister Jeannene Patella is doing, and the year is 27); wife reports cognition near baseline. Pt would benefit from further acute OT to facilitate safe dc. Recommend dc to home once medically stable per physician.       Follow Up Recommendations  No OT follow up;Supervision/Assistance - 24 hour    Equipment Recommendations  Other (comment)(Power hoyer lift)    Recommendations for Other Services PT consult     Precautions / Restrictions Precautions Precautions: Fall Precaution Comments: Dementia, MS, and Bil AKA      Mobility Bed Mobility Overal bed mobility: Needs Assistance             General bed mobility comments: Total A for reposition  Transfers                 General transfer comment: Declined    Balance                                           ADL either performed or assessed with clinical judgement   ADL Overall ADL's : Needs  assistance/impaired Eating/Feeding: Set up;Cueing for sequencing;Bed level Eating/Feeding Details (indicate cue type and reason): declined to eat any breakfast Grooming: Set up;Supervision/safety;Bed level;Wash/dry face Grooming Details (indicate cue type and reason): Pt taking wash cloth and washing his face with Min cues for initation Upper Body Bathing: Moderate assistance;Bed level   Lower Body Bathing: Total assistance;Bed level   Upper Body Dressing : Moderate assistance;Bed level   Lower Body Dressing: Total assistance;Bed level   Toilet Transfer: Total assistance   Toileting- Clothing Manipulation and Hygiene: Total assistance;Bed level       Functional mobility during ADLs: Total assistance(lift) General ADL Comments: Pt near baseline functional performance. Pt currently requiring Max-Total A for ADLs. Max cues for engagement. Pt abel to wash her face at bed level with Min ces for initiation.      Vision         Perception     Praxis      Pertinent Vitals/Pain Pain Assessment: Faces Faces Pain Scale: No hurt Pain Intervention(s): Monitored during session     Hand Dominance Right   Extremity/Trunk Assessment Upper Extremity Assessment Upper Extremity Assessment: Overall WFL for tasks assessed   Lower Extremity Assessment Lower Extremity Assessment: RLE deficits/detail;LLE deficits/detail RLE Deficits / Details: Bilateral AKA RLE Coordination: decreased gross motor LLE Deficits / Details: Bilateral AKA LLE Coordination: decreased gross  motor   Cervical / Trunk Assessment Cervical / Trunk Assessment: Other exceptions Cervical / Trunk Exceptions: left flexion at neck. scoliosis per wife   Communication Communication Communication: No difficulties   Cognition Arousal/Alertness: Awake/alert Behavior During Therapy: WFL for tasks assessed/performed Overall Cognitive Status: History of cognitive impairments - at baseline                                      General Comments  VSS    Exercises     Shoulder Instructions      Home Living Family/patient expects to be discharged to:: Private residence Living Arrangements: Spouse/significant other Available Help at Discharge: Family;Personal care attendant;Available 24 hours/day Type of Home: House Home Access: Ramped entrance     Home Layout: One level     Bathroom Shower/Tub: Other (comment)(Bed bath)         Home Equipment: Wheelchair - power;Hospital bed;Other (comment)(Hoyer lift)          Prior Functioning/Environment Level of Independence: Needs assistance  Gait / Transfers Assistance Needed: bedbound; w/c for appointments ADL's / Homemaking Assistance Needed: PCA and wife BADLs   Comments: Two aides - mon-fri and sometimes sat        OT Problem List: Decreased strength;Decreased range of motion;Decreased activity tolerance;Impaired balance (sitting and/or standing);Decreased knowledge of use of DME or AE;Decreased knowledge of precautions;Decreased cognition      OT Treatment/Interventions: Self-care/ADL training;Therapeutic exercise;Energy conservation;DME and/or AE instruction;Therapeutic activities;Patient/family education    OT Goals(Current goals can be found in the care plan section) Acute Rehab OT Goals Patient Stated Goal: Wife, "for him to return home" OT Goal Formulation: With family Time For Goal Achievement: 07/11/19 Potential to Achieve Goals: Good  OT Frequency: Min 2X/week   Barriers to D/C:            Co-evaluation              AM-PAC OT "6 Clicks" Daily Activity     Outcome Measure Help from another person eating meals?: A Little Help from another person taking care of personal grooming?: A Little Help from another person toileting, which includes using toliet, bedpan, or urinal?: Total Help from another person bathing (including washing, rinsing, drying)?: A Lot Help from another person to put on and taking off  regular upper body clothing?: A Lot Help from another person to put on and taking off regular lower body clothing?: Total 6 Click Score: 12   End of Session Nurse Communication: Mobility status  Activity Tolerance: Patient limited by fatigue Patient left: in bed;with call bell/phone within reach;with bed alarm set  OT Visit Diagnosis: Unsteadiness on feet (R26.81);Other abnormalities of gait and mobility (R26.89);Muscle weakness (generalized) (M62.81);Other symptoms and signs involving cognitive function                Time: [id number] OT Time Calculation (min): 19 min Charges:  OT General Charges $OT Visit: 1 Visit OT Evaluation $OT Eval Moderate Complexity: [place], OTR/L Acute Rehab Pager: [phone] Office: [place] 06/27/2019, 9:36 AM

## 2019-06-28 ENCOUNTER — Other Ambulatory Visit: Payer: Self-pay

## 2019-06-28 ENCOUNTER — Other Ambulatory Visit: Payer: Self-pay | Admitting: *Deleted

## 2019-06-28 ENCOUNTER — Ambulatory Visit: Payer: Self-pay | Admitting: *Deleted

## 2019-06-28 LAB — BASIC METABOLIC PANEL
Anion gap: 8 (ref 5–15)
BUN: 26 mg/dL — ABNORMAL HIGH (ref 8–23)
CO2: 22 mmol/L (ref 22–32)
Calcium: 8.8 mg/dL — ABNORMAL LOW (ref 8.9–10.3)
Chloride: 110 mmol/L (ref 98–111)
Creatinine, Ser: 0.95 mg/dL (ref 0.61–1.24)
GFR calc Af Amer: >60 mL/min (ref >60)
GFR calc non Af Amer: >60 mL/min (ref >60)
Glucose, Bld: 149 mg/dL — ABNORMAL HIGH (ref 70–99)
Potassium: 4.4 mmol/L (ref 3.5–5.1)
Sodium: 140 mmol/L (ref 135–145)

## 2019-06-28 LAB — GLUCOSE, CAPILLARY
Glucose-Capillary: 128 mg/dL — ABNORMAL HIGH (ref 70–99)
Glucose-Capillary: 140 mg/dL — ABNORMAL HIGH (ref 70–99)
Glucose-Capillary: 178 mg/dL — ABNORMAL HIGH (ref 70–99)
Glucose-Capillary: 176 mg/dL — ABNORMAL HIGH (ref 70–99)

## 2019-06-28 MED ORDER — SODIUM CHLORIDE 0.9 % IV SOLN
INTRAVENOUS | Status: DC | PRN
  Administered 2019-06-28: 21:00:00 250 mL via INTRAVENOUS

## 2019-06-28 MED ORDER — FUROSEMIDE 40 MG PO TABS
40.0000 mg | ORAL_TABLET | Freq: Two times a day (BID) | ORAL | Status: DC
  Administered 2019-06-28 – 2019-06-29 (×2): 40 mg via ORAL
  Filled 2019-06-28 (×2): qty 1

## 2019-06-28 MED ORDER — HYDRALAZINE HCL 20 MG/ML IJ SOLN
5.0000 mg | Freq: Once | INTRAMUSCULAR | Status: AC
  Administered 2019-06-28: 04:00:00 5 mg via INTRAVENOUS
  Filled 2019-06-28: qty 1

## 2019-06-28 NOTE — Progress Notes (Signed)
PROGRESS NOTE    Jaime [name]  [id number] DOB: Jan 30, 1941 DOA: 06/25/2019 PCP: [name], MD     Brief Narrative:  Jaime [name] is a 78 y.o. male with history of CAD status post stenting, chronic systolic heart failure last EF in 2019 was 30 to 35%, complete heart block status post pacemaker placement, diabetes mellitus type 2, multiple sclerosis progressive bedbound on baclofen bilateral AKA recently admitted for pneumonia and UTI in September 2020 was brought to the ER after patient has been feeling weak and has been having poor appetite.  In the emergency department, patient was found to be hypotensive with SBP in the 60s.  UA was consistent with UTI and patient was started on broad-spectrum antibiotics.  New events last 24 hours / Subjective: Doing well, no new complaints. Pleased to hear that I spoke with his wife yesterday afternoon.   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  De-escalate to cefepime while awaiting urine culture result -Blood pressure improved -Repeat lactic acid improved  -Spoke with microbiology lab, susceptibilities from urine culture will not be available today.  Currently growing Acinetobacter baumannii and klebsiella  pneumonia   AKI -Likely prerenal in setting of sepsis -Resolved  Chronic systolic heart failure -EF 30 to 35% -BNP 66  -Resume lisinopril, lasix  -Continue to monitor volume status  Multiple sclerosis, remains bedbound at baseline -Continue baclofen  Diabetes mellitus type 2, well controlled -Hemoglobin A1c 7.7  -Sliding-scale insulin  History of complete heart block status post pacemaker History of CAD History of peripheral vascular disease status post bilateral AKA -Stable  -Trop negative  -Continue aspirin, crestor    DVT prophylaxis: Lovenox Code Status: Full Family Communication: None at bedside Disposition Plan: Pending clinical improvement.  Await urine culture result.  Hopefully discharge home 12/8   Consultants:   None  Procedures:   None   Antimicrobials:  Anti-infectives (From admission, onward)   Start     Dose/Rate Route Frequency Ordered Stop   06/26/19 1000  ceFEPIme (MAXIPIME) 2 g in sodium chloride 0.9 % 100 mL IVPB     2 g 200 mL/hr over 30 Minutes Intravenous Every 12 hours 06/25/19 1953     06/26/19 1000  vancomycin (VANCOCIN) IVPB 1000 mg/200 mL premix  Status:  Discontinued     1,000 mg 200 mL/hr over 60 Minutes Intravenous Every 24 hours 06/25/19 1953 06/26/19 0740   06/26/19 0645  metroNIDAZOLE (FLAGYL) IVPB 500 mg  Status:  Discontinued     500 mg 100 mL/hr over 60 Minutes Intravenous Every 8 hours 06/26/19 0631 06/26/19 0740   06/25/19 1930  ceFEPIme (MAXIPIME) 2 g in sodium chloride 0.9 % 100 mL IVPB     2 g 200 mL/hr over 30 Minutes Intravenous  Once 06/25/19 1918 06/25/19 2330  06/25/19 1930  metroNIDAZOLE (FLAGYL) IVPB 500 mg     500 mg 100 mL/hr over 60 Minutes Intravenous  Once 06/25/19 1918 06/25/19 2220   06/25/19 1930  vancomycin (VANCOCIN) IVPB 1000 mg/200 mL premix  Status:  Discontinued     1,000 mg 200 mL/hr over 60 Minutes Intravenous  Once 06/25/19 1918 06/25/19 1925   06/25/19 1930  vancomycin (VANCOCIN) 1,250 mg  in sodium chloride 0.9 % 250 mL IVPB     1,250 mg 166.7 mL/hr over 90 Minutes Intravenous  Once 06/25/19 1925 06/26/19 0256       Objective: Vitals:   06/28/19 0351 06/28/19 0454 06/28/19 0707 06/28/19 0801  BP: (!) 185/89 127/78 136/77 (!) 161/85  Pulse: 82   96  Resp: 18  16 (!) 23  Temp: 98.2 F (36.8 C)  98.3 F (36.8 C) 98.3 F (36.8 C)  TempSrc: Oral  Oral Oral  SpO2: 100%  100% 100%  Weight:      Height:        Intake/Output Summary (Last 24 hours) at 06/28/2019 1130 Last data filed at 06/28/2019 0353 Gross per 24 hour  Intake 380 ml  Output 1550 ml  Net -1170 ml   Filed Weights   06/25/19 1900 06/26/19 1554  Weight: 67.4 kg 66.3 kg    Examination: General exam: Appears calm and comfortable  Respiratory system: Clear to auscultation. Respiratory effort normal. Cardiovascular system: S1 & S2 heard, RRR. No pedal edema. Gastrointestinal system: Abdomen is nondistended, soft and nontender. Normal bowel sounds heard. Central nervous system: Alert. Non focal exam.  Extremities: Bilateral AKA Skin: No rashes, lesions or ulcers on exposed skin  Psychiatry: Pleasant, stable   Data Reviewed: I have personally reviewed following labs and imaging studies  CBC: Recent Labs  Lab 06/25/19 1846 06/26/19 0033 06/27/19 0220  WBC 5.0 4.7 5.1  HGB 11.5* 10.2* 11.5*  HCT 35.8* 33.5* 36.1*  MCV 83.1 86.3 82.2  PLT 135* 113* [id number]*   Basic Metabolic Panel: Recent Labs  Lab 06/25/19 1846 06/26/19 0033 06/26/19 1453 06/27/19 0220 06/28/19 0217  NA 133*  --  140 140 140  K 4.4  --  4.3 4.5 4.4  CL 104  --  109 110 110  CO2 19*  --  24 22 22   GLUCOSE 151*  --  143* 129* 149*  BUN 31*  --  23 22 26*  CREATININE 1.48* 1.05 0.91 0.73 0.95  CALCIUM 8.4*  --  8.8* 8.6* 8.8*   GFR: Estimated Creatinine Clearance: 61.1 mL/min (by C-G formula based on SCr of 0.95 mg/dL). Liver Function Tests: Recent Labs  Lab 06/26/19 0033  AST 12*  ALT 10  ALKPHOS 41  BILITOT  0.5  PROT 5.5*  ALBUMIN 2.5*   No results for input(s): LIPASE, AMYLASE in the last 168 hours. No results for input(s): AMMONIA in the last 168 hours. Coagulation Profile: No results for input(s): INR, PROTIME in the last 168 hours. Cardiac Enzymes: No results for input(s): CKTOTAL, CKMB, CKMBINDEX, TROPONINI in the last 168 hours. BNP (last 3 results) No results for input(s): PROBNP in the last 8760 hours. HbA1C: Recent Labs    06/26/19 1453  HGBA1C 7.7*   CBG: Recent Labs  Lab 06/27/19 1149 06/27/19 1635 06/27/19 2106 06/28/19 0633 06/28/19 1122  GLUCAP 147* 175* 154* 128* 140*   Lipid Profile: No results for input(s): CHOL, HDL, LDLCALC, TRIG, CHOLHDL, LDLDIRECT in the last 72 hours. Thyroid Function Tests: No results for input(s): TSH, T4TOTAL,  FREET4, T3FREE, THYROIDAB in the last 72 hours. Anemia Panel: No results for input(s): VITAMINB12, FOLATE, FERRITIN, TIBC, IRON, RETICCTPCT in the last 72 hours. Sepsis Labs: Recent Labs  Lab 06/26/19 0033 06/26/19 1657  PROCALCITON 0.20  --   LATICACIDVEN 5.9* 1.2    Recent Results (from the past 240 hour(s))  Urine culture     Status: Abnormal (Preliminary result)   Collection Time: 06/25/19 11:03 PM   Specimen: In/Out Cath Urine  Result Value Ref Range Status   Specimen Description IN/OUT CATH URINE  Final   Special Requests NONE  Final   Culture (A)  Final    >=100,000 COLONIES/mL ACINETOBACTER BAUMANNII >=100,000 COLONIES/mL KLEBSIELLA PNEUMONIAE SUSCEPTIBILITIES TO FOLLOW Performed at [facility], [place] [address] [place], [place] [address]    Report Status PENDING  Incomplete  SARS CORONAVIRUS 2 (TAT 6-24 HRS) Nasopharyngeal Nasopharyngeal Swab     Status: None   Collection Time: 06/25/19 11:45 PM   Specimen: Nasopharyngeal Swab  Result Value Ref Range Status   SARS Coronavirus 2 NEGATIVE NEGATIVE Final    Comment: (NOTE) SARS-CoV-2 target nucleic acids are NOT DETECTED. The SARS-CoV-2 RNA is  generally detectable in upper and lower respiratory specimens during the acute phase of infection. Negative results do not preclude SARS-CoV-2 infection, do not rule out co-infections with other pathogens, and should not be used as the sole basis for treatment or other patient management decisions. Negative results must be combined with clinical observations, patient history, and epidemiological information. The expected result is Negative. Fact Sheet for Patients: SugarRoll.be Fact Sheet for Healthcare Providers: https://www.woods-mathews.com/ This test is not yet approved or cleared by the [place] FDA and  has been authorized for detection and/or diagnosis of SARS-CoV-2 by FDA under an Emergency Use Authorization (EUA). This EUA will remain  in effect (meaning this test can be used) for the duration of the COVID-19 declaration under Section 56 4(b)(1) of the Act, 21 U.S.C. section 360bbb-3(b)(1), unless the authorization is terminated or revoked sooner. Performed at [facility], [place] [address] [place], [place] [address]   Blood Culture (routine x 2)     Status: None (Preliminary result)   Collection Time: 06/26/19 12:14 AM   Specimen: BLOOD  Result Value Ref Range Status   Specimen Description BLOOD LEFT HAND  Final   Special Requests   Final    BOTTLES DRAWN AEROBIC ONLY Blood Culture results may not be optimal due to an inadequate volume of blood received in culture bottles   Culture   Final    NO GROWTH 1 DAY Performed at [facility], [place] [address] [place], [place] [address]    Report Status PENDING  Incomplete  Blood Culture (routine x 2)     Status: None (Preliminary result)   Collection Time: 06/26/19 12:33 AM   Specimen: BLOOD  Result Value Ref Range Status   Specimen Description BLOOD UPPER ARM  Final   Special Requests   Final    BOTTLES DRAWN AEROBIC ONLY Blood Culture adequate volume   Culture    Final    NO GROWTH 1 DAY Performed at [facility], [place] [address] [place],  [address]    Report Status PENDING  Incomplete  MRSA PCR Screening     Status: Abnormal   Collection Time: 06/26/19  3:50 PM   Specimen: Nasal Mucosa; Nasopharyngeal  Result Value Ref Range Status   MRSA by PCR POSITIVE (A) NEGATIVE Final  Comment:        The GeneXpert MRSA Assay (FDA approved for NASAL specimens only), is one component of a comprehensive MRSA colonization surveillance program. It is not intended to diagnose MRSA infection nor to guide or monitor treatment for MRSA infections. RESULT CALLED TO, READ BACK BY AND VERIFIED WITH: RN Gordy Clement [id number] MLM Performed at [place] [address] [place], [place] [address]       Radiology Studies: No results found.    Scheduled Meds: . aspirin EC  81 mg Oral Daily  . baclofen  5 mg Oral BID  . enoxaparin (LOVENOX) injection  40 mg Subcutaneous Daily  . insulin aspart  0-9 Units Subcutaneous TID WC  . lisinopril  10 mg Oral Daily  . mupirocin ointment   Nasal BID  . pantoprazole  40 mg Oral Q lunch  . pregabalin  150 mg Oral Daily   And  . pregabalin  300 mg Oral QHS  . rosuvastatin  20 mg Oral Daily   Continuous Infusions: . ceFEPime (MAXIPIME) IV 2 g (06/28/19 0944)     LOS: 3 days      Time spent: 25 minutes   Dessa Phi, DO [facility] 06/28/2019, 11:30 AM   Available via Epic secure chat 7am-7pm After these hours, please refer to coverage provider listed on [email or web address]

## 2019-06-28 NOTE — Consult Note (Signed)
   [facility] Inpatient Consult   06/28/2019  Jaime [name] March 04, 1941 [id number]    [facility] Active Status: ACTIVE   Alert about patient's admission by [place] Coordinator. Patient is currently active with [place] Management for chronic disease management services. Patient in the Medicare NextGen ACO.  Primary Care Provider: Billey Gosling, MD with [place]   Plan:  Follow up with Inpatient [facility] team member and made aware that [place] Management following. Continue to follow for progress and disposition needs and changes.  Of note, [facility] Care Management services does not replace or interfere with any services that are needed or arranged by inpatient [facility] care management team.  For additional questions or referrals please contact:  Natividad Brood, RN BSN [facility] Liaison  [phone] business mobile phone Toll free office [phone]  Fax number: [phone] [email or web address]

## 2019-06-28 NOTE — Patient Outreach (Signed)
[facility]) Care Management  06/28/2019  JEHIEL SPRATLEY [birth year] [id number]   Member was scheduled for outreach today but noted he was admitted to hospital on 12/4.  Hospital liaisons notified, will follow up with member/wife pending discharge.  [name], [place], MSN [place] [phone]

## 2019-06-29 LAB — BASIC METABOLIC PANEL
Anion gap: 7 (ref 5–15)
BUN: 23 mg/dL (ref 8–23)
CO2: 22 mmol/L (ref 22–32)
Calcium: 8.8 mg/dL — ABNORMAL LOW (ref 8.9–10.3)
Chloride: 108 mmol/L (ref 98–111)
Creatinine, Ser: 0.69 mg/dL (ref 0.61–1.24)
GFR calc Af Amer: >60 mL/min (ref >60)
GFR calc non Af Amer: >60 mL/min (ref >60)
Glucose, Bld: 132 mg/dL — ABNORMAL HIGH (ref 70–99)
Potassium: 3.8 mmol/L (ref 3.5–5.1)
Sodium: 137 mmol/L (ref 135–145)

## 2019-06-29 LAB — GLUCOSE, CAPILLARY
Glucose-Capillary: 113 mg/dL — ABNORMAL HIGH (ref 70–99)
Glucose-Capillary: 221 mg/dL — ABNORMAL HIGH (ref 70–99)

## 2019-06-29 NOTE — TOC Initial Note (Signed)
Transition of Care [facility]) - Initial/Assessment Note    Patient Details  Name: Jaime [name] MRN: [id number] Date of Birth: 02-28-1941  [facility]) CM/SW Contact:    Zenon Mayo, RN Phone Number: 06/29/2019, 1:04 PM  Clinical Narrative:                 Patient for dc today, no needs.  Expected Discharge Plan: Home/Self Care Barriers to Discharge: No Barriers Identified   Patient Goals and CMS Choice     Choice offered to / list presented to : NA  Expected Discharge Plan and Services Expected Discharge Plan: Home/Self Care   Discharge Planning Services: CM Consult   Living arrangements for the past 2 months: Single Family Home Expected Discharge Date: 06/29/19                         [facility] Arranged: NA          Prior Living Arrangements/Services Living arrangements for the past 2 months: Single Family Home Lives with:: Spouse Patient language and need for interpreter reviewed:: Yes              Criminal Activity/Legal Involvement Pertinent to Current Situation/Hospitalization: No - Comment as needed  Activities of Daily Living Home Assistive Devices/Equipment: [employer] fast streamer, Wheelchair ADL Screening (condition at time of admission) Patient's cognitive ability adequate to safely complete daily activities?: No Is the patient deaf or have difficulty hearing?: No Does the patient have difficulty seeing, even when wearing glasses/contacts?: No Does the patient have difficulty concentrating, remembering, or making decisions?: Yes Patient able to express need for assistance with ADLs?: Yes Does the patient have difficulty dressing or bathing?: Yes Independently performs ADLs?: No Does the patient have difficulty walking or climbing stairs?: Yes Weakness of Legs: Both Weakness of Arms/Hands: None  Permission Sought/Granted                  Emotional Assessment       Orientation: : Oriented to Self, Oriented to Place, Oriented to   Time, Oriented to Situation      Admission diagnosis:  Urinary tract infection without hematuria, site unspecified [N39.0] Hypotension due to hypovolemia [I95.89, E86.1] SIRS (systemic inflammatory response syndrome) (HCC) [R65.10] Patient Active Problem List   Diagnosis Date Noted  . Severe sepsis ([place]) 06/26/2019  . ARF (acute renal failure) ([place]) 06/25/2019  . Heart block AV complete ([place]) 02/12/2019  . Biventricular cardiac pacemaker in situ 02/12/2019  . Acute metabolic encephalopathy [id number]  . 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 11/16/2018  . Second degree [place] I AV block 11/12/2018  . Abnormal liver function   . UTI due to Klebsiella species 11/24/2017  . Hypoglycemia   . GERD (gastroesophageal reflux disease) 04/10/2016  . Coronary artery disease involving native coronary artery of native heart without angina pectoris 12/28/2015  . PAD (peripheral artery disease) ([place]) 12/28/2015  . Symptomatic bradycardia 08/14/2015  . Chronic systolic heart failure ([place]) 08/14/2015  . Phantom limb pain ([place]) 12/08/2014  . Syncope 11/01/2014  . Severe sepsis with septic shock ([place]) 08/18/2014  . Multiple sclerosis, secondary progressive ([place]) 06/10/2014  . Paraparesis of both lower limbs ([place]) 05/31/2014  . Scoliosis, thoracogenic, acquired 03/15/2014  . Mild cognitive impairment 02/11/2014  . PVD (peripheral vascular disease) ([place]) 08/13/2013  . S/P AKA (above knee amputation) bilateral ([place]) 08/03/2012  . PAC (premature atrial contraction)   . Peripheral vascular disease, unspecified ([place]) 07/03/2012  .  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 Ejection fraction   . Cardiomyopathy, ischemic   . Pulmonary hypertension ([place])   . Hemiparesis ([place])   . Essential hypertension   . Carotid artery disease ([place])   . Hyperlipidemia 07/31/2007  . BPH (benign prostatic hyperplasia) 04/15/2007  . DMII (diabetes mellitus, type 2)  ([place]) 12/18/2006   PCP:  [name], MD Pharmacy:   [facility] [place], [place] [address] Phone: [phone] Fax: [phone]  [name] [place] [place] - [address] [place] [address] Phone: [phone] Fax: [phone]     Social Determinants of Health (SDOH) Interventions    Readmission Risk Interventions Readmission Risk Prevention Plan 06/29/2019 11/19/2018  Transportation Screening Complete Complete  PCP or Specialist Appt within 5-7 Days - Complete  PCP or Specialist Appt within 3-5 Days Complete -  Home Care Screening - Complete  Medication Review (RN CM) - Complete  HRI or Home Care Consult Complete -  Social Work Consult for [place] Planning/Counseling Complete -  Palliative Care Screening Not Applicable -  Medication Review (RN Care Manager) Complete -  Some recent data might be hidden

## 2019-06-29 NOTE — Progress Notes (Signed)
All set for discharge home, discharge instructions given to pt. Awaiting PTAR for transport. Wife made aware.

## 2019-06-29 NOTE — Consult Note (Signed)
   [facility] CM Inpatient Consult   06/29/2019  EDNA SPEARS 1941-05-07 [id number]  Follow up:  Attempted to contact patient's wife, Anjay Kokal by cell phone [phone] and went straight to a voicemail with no identification as well as the home number listed [phone] was a voice mail and left a generic message with [facility] office number.  [facility] RNCM will follow up for post hospital needs as well.  For questions, please contact:  Natividad Brood, RN BSN [facility] Liaison  [phone] business mobile phone Toll free office [phone]  Fax number: [phone] [email or web address]

## 2019-06-29 NOTE — Discharge Summary (Signed)
Physician Discharge Summary  Jaime [name] [id number] DOB: 1940/10/13 DOA: 06/25/2019  PCP: Jaime Rail, MD  Admit date: 06/25/2019 Discharge date: 06/29/2019  Admitted From: Home Disposition:  Home   Recommendations for Outpatient Follow-up:  1. Follow up with PCP in 1 week  Discharge Condition: Stable CODE STATUS: Full  Diet recommendation: Carb modified   Brief/Interim Summary: Jaime [name] a 78 y.o.malewithhistory of CAD status post stenting, chronic systolic heart failure last EF in 2019 was 30 to 35%, complete heart block status post pacemaker placement, diabetes mellitus type 2, multiple sclerosis progressive bedbound on baclofen bilateral AKA recently admitted for pneumonia and UTI in September 2020 was brought to the ER after patient has been feeling weak and has been having poor appetite.  In the emergency department, patient was found to be hypotensive with SBP in the 60s.  UA was consistent with UTI and patient was started on broad-spectrum antibiotics. Patient's urine culture showed acinetobacter calcoaceticus/baumannii and klebsiella. Acinetobacter was not sensitive to cephalosporins, but patient had clinical improvement, which likely means this represents colonization. Discussed with ID on call. Patient improved clinically and was discharged home.   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  De-escalated to cefepime  -Blood pressure improved -Repeat lactic acid improved  -Patient's urine culture showed acinetobacter calcoaceticus/baumannii and klebsiella. Acinetobacter was not sensitive to cephalosporins, but patient had clinical improvement, which likely means this represents colonization. Discussed with ID on call.  AKI -Likely prerenal in setting of sepsis -Resolved  Chronic systolic heart failure -EF 30 to 35% -BNP 66  -Resume lisinopril, lasix  -Continue to monitor volume status  Multiple sclerosis, remains bedbound at baseline -Continue baclofen  Diabetes mellitus type 2, well controlled -Hemoglobin A1c 7.7  -Sliding-scale insulin  History of complete heart block status post pacemaker History of CAD History of peripheral vascular disease status post bilateral AKA -Stable  -Trop negative  -Continue aspirin, crestor    Discharge Instructions  Discharge Instructions    Call MD for:  difficulty breathing, headache or visual disturbances   Complete by: As directed    Call MD for:  extreme fatigue   Complete by: As directed    Call MD for:  persistant dizziness or light-headedness   Complete by: As directed    Call MD for:  persistant nausea and vomiting   Complete by: As directed    Call MD for:  severe uncontrolled pain   Complete by: As directed    Call MD for:  temperature >100.4   Complete by: As directed    Diet Carb Modified   Complete by: As directed    Discharge instructions   Complete by: As directed    You were cared for by a hospitalist during your hospital stay. If you have any questions about your  discharge medications or the care you received while you were in the hospital after you are discharged, you can call the unit and ask to speak with the hospitalist on call if  the hospitalist that took care of you is not available. Once you are discharged, your primary care physician will handle any further medical issues. Please note that NO REFILLS for any discharge medications will be authorized once you are discharged, as it is imperative that you return to your primary care physician (or establish a relationship with a primary care physician if you do not have one) for your aftercare needs so that they can reassess your need for medications and monitor your lab values.   Increase activity slowly   Complete by: As directed      Allergies as of 06/29/2019      Reactions   Bee Venom Anaphylaxis   Influenza Vaccines Other (See Comments)   Made patient "sick for months"   Atorvastatin Other (See Comments)   Severe pain in stumps   Latex Rash      Medication List    TAKE these medications   albuterol 108 (90 Base) MCG/ACT inhaler Commonly known as: VENTOLIN HFA Inhale 2 puffs into the lungs every 6 (six) hours as needed for wheezing or shortness of breath.   amoxicillin 500 MG tablet Commonly known as: AMOXIL Take 4 tablets (2,000 mg) 30 minutes prior to dental procedure   aspirin EC 81 MG tablet Take 1 tablet (81 mg total) by mouth daily.   baclofen 10 MG tablet Commonly known as: LIORESAL Take 0.5 tablets (5 mg total) by mouth 2 (two) times daily.   carvedilol 3.125 MG tablet Commonly known as: Coreg Take 1 tablet (3.125 mg total) by mouth 2 (two) times daily with a meal.   cetirizine 10 MG tablet Commonly known as: ZYRTEC Take 10 mg by mouth daily.   diclofenac sodium 1 % Gel Commonly known as: VOLTAREN Apply 1 application topically 2 (two) times daily as needed (for pain).   furosemide 40 MG tablet Commonly known as: LASIX Take 40 mg by mouth 2 (two) times daily.   GlucaGen 1 MG Solr injection Generic drug: glucagon Inject 1 mg into the muscle once as needed for low blood sugar. Reported on 11/16/2015   guaiFENesin 100 MG/5ML  Soln Commonly known as: ROBITUSSIN Take 5 mLs by mouth every 4 (four) hours as needed for cough or to loosen phlegm.   insulin aspart protamine - aspart (70-30) 100 UNIT/ML FlexPen Commonly known as: NOVOLOG 70/30 MIX Inject 0.05 mLs (5 Units total) into the skin 2 (two) times daily. What changed: additional instructions   lisinopril 10 MG tablet Commonly known as: ZESTRIL TAKE 1 TABLET BY MOUTH EVERY DAY   pantoprazole 40 MG tablet Commonly known as: PROTONIX Take 1 tablet (40 mg total) by mouth daily with lunch.   pregabalin 150 MG capsule Commonly known as: LYRICA TAKE ONE CAPSULE BY MOUTH EVERY MORNING AND 2 CAPSULES EVERY NIGHT AT BEDTIME What changed: See the new instructions.   rosuvastatin 20 MG tablet Commonly known as: CRESTOR TAKE 1 TABLET(20 MG) BY MOUTH DAILY What changed: See the new instructions.   SYSTANE BALANCE OP Place 1-2 drops into both eyes daily as needed (dry eyes).   traMADol 50 MG tablet Commonly known as: ULTRAM Take 1 tablet (50 mg total) by mouth every 6 (six) hours as needed (for pain).   traZODone 100 MG tablet Commonly known as: DESYREL Take 100 mg by  mouth at bedtime.   Vitamin D-3 25 MCG (1000 UT) Caps Take 2,000 Units by mouth daily.       Allergies  Allergen Reactions  . Bee Venom Anaphylaxis  . Influenza Vaccines Other (See Comments)    Made patient "sick for months"  . Atorvastatin Other (See Comments)    Severe pain in stumps  . Latex Rash    Consultations:  None    Procedures/Studies: Dg Chest Port 1 View  Result Date: 06/25/2019 CLINICAL DATA:  Hypotension. EXAM: PORTABLE CHEST 1 VIEW COMPARISON:  05/21/2019 FINDINGS: Left chest wall pacer is noted with leads in the right atrial appendage, right ventricle and coronary sinus. Normal heart size. No pleural effusion or edema. Lungs are clear. IMPRESSION: No active cardiopulmonary abnormalities. Electronically Signed   By: Kerby Moors M.D.   On: 06/25/2019 20:10        Discharge Exam: Vitals:   06/29/19 0846 06/29/19 1004  BP:  120/70  Pulse:  78  Resp:    Temp: 98 F (36.7 C)   SpO2:  94%    General: Pt is alert, awake, not in acute distress Cardiovascular: RRR, S1/S2 +, no edema Respiratory: CTA bilaterally, no wheezing, no rhonchi, no respiratory distress, no conversational dyspnea  Abdominal: Soft, NT, ND, bowel sounds + Extremities: +Bilateral AKA  Psych: Normal mood and affect, stable judgement and insight     The results of significant diagnostics from this hospitalization (including imaging, microbiology, ancillary and laboratory) are listed below for reference.     Microbiology: Recent Results (from the past 240 hour(s))  Urine culture     Status: Abnormal (Preliminary result)   Collection Time: 06/25/19 11:03 PM   Specimen: In/Out Cath Urine  Result Value Ref Range Status   Specimen Description IN/OUT CATH URINE  Final   Special Requests NONE  Final   Culture (A)  Final    >=100,000 COLONIES/mL ACINETOBACTER CALCOACETICUS/BAUMANNII COMPLEX >=100,000 COLONIES/mL KLEBSIELLA PNEUMONIAE SUSCEPTIBILITIES TO FOLLOW Performed at [facility], [place] [address] [place], [place] [address]    Report Status PENDING  Incomplete   Organism ID, Bacteria ACINETOBACTER CALCOACETICUS/BAUMANNII COMPLEX (A)  Final      Susceptibility   Acinetobacter calcoaceticus/baumannii complex - MIC*    CEFTAZIDIME >=64 RESISTANT Resistant     CEFTRIAXONE >=64 RESISTANT Resistant     CIPROFLOXACIN >=4 RESISTANT Resistant     GENTAMICIN >=16 RESISTANT Resistant     IMIPENEM 1 SENSITIVE Sensitive     PIP/TAZO >=128 RESISTANT Resistant     TRIMETH/SULFA >=320 RESISTANT Resistant     CEFEPIME >=64 RESISTANT Resistant     AMPICILLIN/SULBACTAM 8 SENSITIVE Sensitive     * >=100,000 COLONIES/mL ACINETOBACTER CALCOACETICUS/BAUMANNII COMPLEX  SARS CORONAVIRUS 2 (TAT 6-24 HRS) Nasopharyngeal Nasopharyngeal Swab     Status: None   Collection Time:  06/25/19 11:45 PM   Specimen: Nasopharyngeal Swab  Result Value Ref Range Status   SARS Coronavirus 2 NEGATIVE NEGATIVE Final    Comment: (NOTE) SARS-CoV-2 target nucleic acids are NOT DETECTED. The SARS-CoV-2 RNA is generally detectable in upper and lower respiratory specimens during the acute phase of infection. Negative results do not preclude SARS-CoV-2 infection, do not rule out co-infections with other pathogens, and should not be used as the sole basis for treatment or other patient management decisions. Negative results must be combined with clinical observations, patient history, and epidemiological information. The expected result is Negative. Fact Sheet for Patients: SugarRoll.be Fact Sheet for Healthcare Providers: https://www.woods-mathews.com/ This test is  not yet approved or cleared by the [place] and  has been authorized for detection and/or diagnosis of SARS-CoV-2 by FDA under an Emergency Use Authorization (EUA). This EUA will remain  in effect (meaning this test can be used) for the duration of the COVID-19 declaration under Section 56 4(b)(1) of the Act, 21 U.S.C. section 360bbb-3(b)(1), unless the authorization is terminated or revoked sooner. Performed at [facility], [place] [address], [place], [place] [address]   Blood Culture (routine x 2)     Status: None (Preliminary result)   Collection Time: 06/26/19 12:14 AM   Specimen: BLOOD  Result Value Ref Range Status   Specimen Description BLOOD LEFT HAND  Final   Special Requests   Final    BOTTLES DRAWN AEROBIC ONLY Blood Culture results may not be optimal due to an inadequate volume of blood received in culture bottles   Culture   Final    NO GROWTH 3 DAYS Performed at [facility], [place] [address] [place], [place] [address]    Report Status PENDING  Incomplete  Blood Culture (routine x 2)     Status: None (Preliminary result)   Collection  Time: 06/26/19 12:33 AM   Specimen: BLOOD  Result Value Ref Range Status   Specimen Description BLOOD UPPER ARM  Final   Special Requests   Final    BOTTLES DRAWN AEROBIC ONLY Blood Culture adequate volume   Culture   Final    NO GROWTH 3 DAYS Performed at [facility], [address] [place], [place] [address]    Report Status PENDING  Incomplete  MRSA PCR Screening     Status: Abnormal   Collection Time: 06/26/19  3:50 PM   Specimen: Nasal Mucosa; Nasopharyngeal  Result Value Ref Range Status   MRSA by PCR POSITIVE (A) NEGATIVE Final    Comment:        The GeneXpert MRSA Assay (FDA approved for NASAL specimens only), is one component of a comprehensive MRSA colonization surveillance program. It is not intended to diagnose MRSA infection nor to guide or monitor treatment for MRSA infections. RESULT CALLED TO, READ BACK BY AND VERIFIED WITH: RN Gordy Clement [id number] MLM Performed at [place] [address] [place], [place] [address]      Labs: BNP (last 3 results) Recent Labs    11/16/18 0920 05/21/19 1712 06/25/19 1846  BNP 110.8* 100.5* [id number]   Basic Metabolic Panel: Recent Labs  Lab 06/25/19 1846 06/26/19 0033 06/26/19 1453 06/27/19 0220 06/28/19 0217 06/29/19 0229  NA 133*  --  140 140 140 137  K 4.4  --  4.3 4.5 4.4 3.8  CL 104  --  109 110 110 108  CO2 19*  --  24 22 22 22   GLUCOSE 151*  --  143* 129* 149* 132*  BUN 31*  --  23 22 26* 23  CREATININE 1.48* 1.05 0.91 0.73 0.95 0.69  CALCIUM 8.4*  --  8.8* 8.6* 8.8* 8.8*   Liver Function Tests: Recent Labs  Lab 06/26/19 0033  AST 12*  ALT 10  ALKPHOS 41  BILITOT 0.5  PROT 5.5*  ALBUMIN 2.5*   No results for input(s): LIPASE, AMYLASE in the last 168 hours. No results for input(s): AMMONIA in the last 168 hours. CBC: Recent Labs  Lab 06/25/19 1846 06/26/19 0033 06/27/19 0220  WBC 5.0 4.7 5.1  HGB 11.5* 10.2* 11.5*  HCT 35.8* 33.5* 36.1*  MCV 83.1 86.3 82.2  PLT 135*  113* 140*   Cardiac Enzymes: No results for input(s): CKTOTAL, CKMB, CKMBINDEX, TROPONINI in the last 168 hours. BNP: Invalid input(s): POCBNP CBG: Recent Labs  Lab 06/28/19 1122 06/28/19 1646 06/28/19 2127 06/29/19 0625 06/29/19 1155  GLUCAP 140* 178* 176* 113* 221*   D-Dimer No results for input(s): DDIMER in the last 72 hours. Hgb A1c Recent Labs    06/26/19 1453  HGBA1C 7.7*   Lipid Profile No results for input(s): CHOL, HDL, LDLCALC, TRIG, CHOLHDL, LDLDIRECT in the last 72 hours. Thyroid function studies No results for input(s): TSH, T4TOTAL, T3FREE, THYROIDAB in the last 72 hours.  Invalid input(s): FREET3 Anemia work up No results for input(s): VITAMINB12, FOLATE, FERRITIN, TIBC, IRON, RETICCTPCT in the last 72 hours. Urinalysis    Component Value Date/Time   COLORURINE YELLOW 06/25/2019 2303   APPEARANCEUR CLOUDY (A) 06/25/2019 2303   LABSPEC 1.010 06/25/2019 2303   PHURINE 5.0 06/25/2019 2303   GLUCOSEU NEGATIVE 06/25/2019 2303   GLUCOSEU NEGATIVE 04/29/2017 1211   HGBUR SMALL (A) 06/25/2019 2303   BILIRUBINUR NEGATIVE 06/25/2019 2303   KETONESUR NEGATIVE 06/25/2019 2303   PROTEINUR 30 (A) 06/25/2019 2303   UROBILINOGEN 0.2 04/29/2017 1211   NITRITE NEGATIVE 06/25/2019 2303   LEUKOCYTESUR LARGE (A) 06/25/2019 2303   Sepsis Labs Invalid input(s): PROCALCITONIN,  WBC,  LACTICIDVEN Microbiology Recent Results (from the past 240 hour(s))  Urine culture     Status: Abnormal (Preliminary result)   Collection Time: 06/25/19 11:03 PM   Specimen: In/Out Cath Urine  Result Value Ref Range Status   Specimen Description IN/OUT CATH URINE  Final   Special Requests NONE  Final   Culture (A)  Final    >=100,000 COLONIES/mL ACINETOBACTER CALCOACETICUS/BAUMANNII COMPLEX >=100,000 COLONIES/mL KLEBSIELLA PNEUMONIAE SUSCEPTIBILITIES TO FOLLOW Performed at [facility], [place] [address], [place], [place] [address]    Report Status PENDING  Incomplete    Organism ID, Bacteria ACINETOBACTER CALCOACETICUS/BAUMANNII COMPLEX (A)  Final      Susceptibility   Acinetobacter calcoaceticus/baumannii complex - MIC*    CEFTAZIDIME >=64 RESISTANT Resistant     CEFTRIAXONE >=64 RESISTANT Resistant     CIPROFLOXACIN >=4 RESISTANT Resistant     GENTAMICIN >=16 RESISTANT Resistant     IMIPENEM 1 SENSITIVE Sensitive     PIP/TAZO >=128 RESISTANT Resistant     TRIMETH/SULFA >=320 RESISTANT Resistant     CEFEPIME >=64 RESISTANT Resistant     AMPICILLIN/SULBACTAM 8 SENSITIVE Sensitive     * >=100,000 COLONIES/mL ACINETOBACTER CALCOACETICUS/BAUMANNII COMPLEX  SARS CORONAVIRUS 2 (TAT 6-24 HRS) Nasopharyngeal Nasopharyngeal Swab     Status: None   Collection Time: 06/25/19 11:45 PM   Specimen: Nasopharyngeal Swab  Result Value Ref Range Status   SARS Coronavirus 2 NEGATIVE NEGATIVE Final    Comment: (NOTE) SARS-CoV-2 target nucleic acids are NOT DETECTED. The SARS-CoV-2 RNA is generally detectable in upper and lower respiratory specimens during the acute phase of infection. Negative results do not preclude SARS-CoV-2 infection, do not rule out co-infections with other pathogens, and should not be used as the sole basis for treatment or other patient management decisions. Negative results must be combined with clinical observations, patient history, and epidemiological information. The expected result is Negative. Fact Sheet for Patients: SugarRoll.be Fact Sheet for Healthcare Providers: https://www.woods-mathews.com/ This test is not yet approved or cleared by the [place] FDA and  has been authorized for detection and/or diagnosis of SARS-CoV-2 by FDA under an Emergency Use Authorization (  EUA). This EUA will remain  in effect (meaning this test can be used) for the duration of the COVID-19 declaration under Section 56 4(b)(1) of the Act, 21 U.S.C. section 360bbb-3(b)(1), unless the authorization is  terminated or revoked sooner. Performed at [facility], [place] [address], [place], [place] [address]   Blood Culture (routine x 2)     Status: None (Preliminary result)   Collection Time: 06/26/19 12:14 AM   Specimen: BLOOD  Result Value Ref Range Status   Specimen Description BLOOD LEFT HAND  Final   Special Requests   Final    BOTTLES DRAWN AEROBIC ONLY Blood Culture results may not be optimal due to an inadequate volume of blood received in culture bottles   Culture   Final    NO GROWTH 3 DAYS Performed at [facility], [place] [address], [place], [place] [address]    Report Status PENDING  Incomplete  Blood Culture (routine x 2)     Status: None (Preliminary result)   Collection Time: 06/26/19 12:33 AM   Specimen: BLOOD  Result Value Ref Range Status   Specimen Description BLOOD UPPER ARM  Final   Special Requests   Final    BOTTLES DRAWN AEROBIC ONLY Blood Culture adequate volume   Culture   Final    NO GROWTH 3 DAYS Performed at [facility], [address], [place], [place] [address]    Report Status PENDING  Incomplete  MRSA PCR Screening     Status: Abnormal   Collection Time: 06/26/19  3:50 PM   Specimen: Nasal Mucosa; Nasopharyngeal  Result Value Ref Range Status   MRSA by PCR POSITIVE (A) NEGATIVE Final    Comment:        The GeneXpert MRSA Assay (FDA approved for NASAL specimens only), is one component of a comprehensive MRSA colonization surveillance program. It is not intended to diagnose MRSA infection nor to guide or monitor treatment for MRSA infections. RESULT CALLED TO, READ BACK BY AND VERIFIED WITH: RN Gordy Clement [id number] MLM Performed at [place] [address] [place], [place] [address]      Patient was seen and examined on the day of discharge and was found to be in stable condition. Time coordinating discharge: 25 minutes including assessment and coordination of care, as well as examination of the patient.    SIGNED:  Dessa Phi, DO [facility] 06/29/2019, 12:35 PM

## 2019-06-29 NOTE — Progress Notes (Signed)
Discharged home via [place]. Discharge paper given to ambulance staff.

## 2019-06-29 NOTE — Care Management Important Message (Signed)
Important Message  Patient Details  Name: JOHNATHIN REINISCH MRN: [id number] Date of Birth: 1940-08-29   Medicare Important Message Given:  Yes     Zenon Mayo, RN 06/29/2019, 12:46 PM

## 2019-06-29 NOTE — Progress Notes (Signed)
Swelling noted on left upper arm iv site . Iv cath. Removed, cold compress applied x 20 min. . MD aware. With no order.

## 2019-06-29 NOTE — Progress Notes (Signed)
Swelling on the left arm iv site, subsided. Denied any pain

## 2019-06-30 ENCOUNTER — Telehealth: Payer: Self-pay | Admitting: *Deleted

## 2019-06-30 ENCOUNTER — Other Ambulatory Visit: Payer: Self-pay | Admitting: *Deleted

## 2019-06-30 LAB — URINE CULTURE: Culture: >=100000 — AB

## 2019-06-30 NOTE — Patient Outreach (Signed)
[facility]) Care Management  06/30/2019  Jaime [name] 07/08/1941 [id number]   Noted member was discharged yesterday from hospital, PCP office will complete TOC assessment.  Call placed to caregiver/wife, she report member is "coming along, doing a lot better" but report he is still having some wheezing.  State swelling has gone down, concerned that they removed the catheter prior to his discharge.  Advised of risk of infection with leaving it in, she verbalized understanding.  He has virtual visit with PCP on 12/22 but she has also reached out to Dr. Altamese Dilling office requesting a home visit/assessment, waiting on a call back.  He did not have any medication changes, report blood pressure has been "good."    Report she has not returned paperwork to [facility] for long term placement, state she is still waiting on tax information from her tax person.  She will submit forms at that time.  State she is wanting to have a plan in place prior to May when her daughter will be going into the hospital for heart surgery as she is unable to care for both of them.    Denies any urgent concerns, advised to contact this care manager with questions.  Will follow up within the next month.  [facility] CM Care Plan Problem One     Most Recent Value  Care Plan Problem One  Risk for decreased support evidenced by caregiver burnout  Role Documenting the Problem One  Care Management [place] for Problem One  Active  [facility]N Long Term Goal   Member will not be readmitted to hospital within the next 31 days  THN Long Term Goal Start Date  06/30/19  Interventions for Problem One [facility] discharge instructions reviewed with member's wife.  Reviewed follow up appointment schedule.  Advised again of importance of making plan for long term options in effort to decrease caregiver burnout and decrease risk of readmission  [facility] CM Short Term Goal #1   Member's wife will report submitting tax  forms to [place] within the next 4 weeks  [facility] CM Short Term Goal #1 Start Date  06/01/19  [facility] CM Short Term Goal #1 Met Date  -- [Not met]  [facility] CM Short Term Goal #2   Member will report plan to complete POA paperwork within the next 4 weeks  [facility] CM Short Term Goal #2 Start Date  06/01/19  [facility] CM Short Term Goal #2 Met Date  -- [Not met]  [facility]N CM Short Term Goal #3  Member will be assessed in the home by visiting provider within the next 3 weeks  [facility] CM Short Term Goal #3 Start Date  06/30/19  Interventions for Short [place] Goal #3  Advised of importance of having home visiting provider come to assess member post discharge versus virtual visit.  Advised to contact office if no call back within the next week.     [name], [place], MSN [place] [phone]

## 2019-06-30 NOTE — Telephone Encounter (Signed)
Called pt/wife concerning hosp d/c from 06/29/19. appt already set up for 07/13/19. Spoke w/wife she states yes, but appt should be an virtual. Change appt to virtual and completed TCM call below.../lmb  Transition Care Management Follow-up Telephone Call   Date discharged? 06/29/19   How have you been since you were released from the hospital? Per wife pt is doing alright   Do you understand why you were in the hospital? YES   Do you understand the discharge instructions? YES   Where were you discharged to? HOME   Items Reviewed:  Medications reviewed: YES, per wife there was no changes on his medications  Allergies reviewed: YES  Dietary changes reviewed: YES, carb modified  Referrals reviewed: YES, no referral recommeded   Functional Questionnaire:   Activities of Daily Living (ADLs):   She states he are independent in the following: feeding and continence States they require assistance with the following: ambulation, bathing and hygiene, grooming and dressing   Any transportation issues/concerns?: NO   Any patient concerns? NO   Confirmed importance and date/time of follow-up visits scheduled YES, (virtual) appt 07/13/19  Provider Appointment booked with [name]  Confirmed with patient if condition begins to worsen call PCP or go to the ER.  Patient was given the office number and encouraged to call back with question or concerns.  : YES

## 2019-07-01 LAB — CULTURE, BLOOD (ROUTINE X 2)
Culture: NO GROWTH
Culture: NO GROWTH
Special Requests: ADEQUATE

## 2019-07-07 ENCOUNTER — Other Ambulatory Visit: Payer: TRICARE For Life (TFL) | Admitting: Hospice

## 2019-07-07 ENCOUNTER — Other Ambulatory Visit: Payer: Self-pay

## 2019-07-07 DIAGNOSIS — Z515 Encounter for palliative care: Secondary | ICD-10-CM

## 2019-07-07 DIAGNOSIS — F0390 Unspecified dementia without behavioral disturbance: Secondary | ICD-10-CM

## 2019-07-07 NOTE — Progress Notes (Signed)
[facility] Consult Note Telephone: [phone]  Fax: [phone]  PATIENT NAME: Jaime [name] DOB: 06-03-1941 MRN: [id number]  PRIMARY CARE PROVIDER:   Dr Clovia Cuff - [facility] REFERRING PROVIDER:  [name], MD [place],  [place] [address]  RESPONSIBLE PARTY:  Yeico Wittstruck, wife [phone]   TELEHEALTH VISIT STATEMENT Due to the COVID-19 crisis, this visit was done via telephone from my office. It was initiated and consented to by this patient and/or family.  RECOMMENDATIONS/PLAN:   Advance Care Planning/Goals of Care:   Goals of care include to maximize quality of life and symptom management. Full Code status. Only wants CPR done once and if unsuccessful then STOP.  MOST form uploaded in [place].Telehealth Visit consisted of building trust and checking on patient's health status. [name] said patient's continues on gradual decline related to his Multiple Sclerosis and Dementia and that family is beginning to be open to Hospice services when it is appropriate. Therapeutic listening and emotional support provided. She was assured of ongoing support whether patient is in Palliative care or Hospice care. . Symptom management: Patient was hospitalized 06/25/2019 for sepsis secondary to UTI. He finished antibiotics as prescribed; denies urinary symptoms, fever. Spouse said urine is now clear yellow with no foul smell. Patient denied pain, discomfort. Dr Georgiann Mohs with [facility] visited last week with no changes to plan of care/medications.  Ongoing  Dementia FAST 6d, beginning to occasionally have difficulty with speech. Last A1c 6.1 in August '20. To continue with Novolog as ordered.  Follow up: Palliative care will continue to follow patient for goals of care clarification and symptom management. I spent 30 minutes providing this consultation, from  11am to 11.30am.  More than 50% of the time in this consultation was  spent on coordinating communication  HISTORY OF PRESENT ILLNESS:Jaime E Spinksis a 78 y.o.year oldmalewith multiple medical problems including Dementia, DMT2, CAD, CHF, MS, has pacemaker placement. Palliative Care was asked to help address goals of care.  CODE STATUS: DNR  PPS: 40% HOSPICE ELIGIBILITY/DIAGNOSIS: TBD  PAST MEDICAL HISTORY:  Past Medical History:  Diagnosis Date  . Atherosclerotic PVD with ulceration ([place])    left foot  . Bacteremia   . CAD (coronary artery disease) 2007   Dr Meda Coffee -  non-STEMI, .occluded circumflex.. Taxus stent placed...residual 80% LAD...50% RCA  . Cardiomyopathy, ischemic    EF 45% per ECHO 2008  //   EF 25%, echo, August, 2013 //  Echo (8/15):  Mild LVH, EF 30-35%, ant-lat and lat AK, inf HK, Gr 1 DD, mild MR, mild LAE  . Carotid artery disease ([place])    a.  Doppler, February, 2012, 0-39% bilateral,Mild smooth plaque;  b.  Carotid US (8/15):  Bilateral 1-39% ICA >>> F/u 2 years  . Constipation   . Dementia ([place])   . Diabetes mellitus    INSULIN-DEPENDent  . Fatigue SEVERE  . H/O pleural effusion 2008   POST THORACENTESIS  . History of colon polyps PRECANCEROUS  . Hyperlipidemia   . Hypertension   . Impotence   . Increased prostate specific antigen (PSA) velocity   . Insomnia   . Lung nodule    resolved 11-2006 CT Chest  . Multiple sclerosis ([place]) [place] -- LAST VISIT 11-20-2010  NOTE W/ CHART  . PAC (premature atrial contraction)    December, 2013  . Pulmonary hypertension ([place])    moderate ECHO Jan 2008  .  Scoliosis associated with other condition   . Second degree Mobitz I AV block 11/12/2018  . Sleep apnea   . Symptomatic bradycardia 11/13/2018  . Systolic heart failure   . TIA (transient ischemic attack)   . Tobacco abuse    quit   . Urinary retention    dx ~ 2-12, like from [place], now with a catheter, saw urology  . Urinary tract infection    hx of    SOCIAL HX:  Social History    Tobacco Use  . Smoking status: Former Smoker    Years: 50.00    Quit date: 07/22/2012    Years since quitting: 6.9  . Smokeless tobacco: Current User  . Tobacco comment: pt states that he is using E-cigs  Substance Use Topics  . Alcohol use: No    Alcohol/week: 0.0 standard drinks    ALLERGIES:  Allergies  Allergen Reactions  . Bee Venom Anaphylaxis  . Influenza Vaccines Other (See Comments)    Made patient "sick for months"  . Atorvastatin Other (See Comments)    Severe pain in stumps  . Latex Rash     PERTINENT MEDICATIONS:  Outpatient Encounter Medications as of 07/07/2019  Medication Sig  . albuterol (PROVENTIL HFA;VENTOLIN HFA) 108 (90 Base) MCG/ACT inhaler Inhale 2 puffs into the lungs every 6 (six) hours as needed for wheezing or shortness of breath.  Marland Kitchen amoxicillin (AMOXIL) 500 MG tablet Take 4 tablets (2,000 mg) 30 minutes prior to dental procedure  . aspirin EC 81 MG tablet Take 1 tablet (81 mg total) by mouth daily.  . baclofen (LIORESAL) 10 MG tablet Take 0.5 tablets (5 mg total) by mouth 2 (two) times daily.  . carvedilol (COREG) 3.125 MG tablet Take 1 tablet (3.125 mg total) by mouth 2 (two) times daily with a meal.  . cetirizine (ZYRTEC) 10 MG tablet Take 10 mg by mouth daily.  . Cholecalciferol (VITAMIN D-3) 1000 units CAPS Take 2,000 Units by mouth daily.  . diclofenac sodium (VOLTAREN) 1 % GEL Apply 1 application topically 2 (two) times daily as needed (for pain).   . furosemide (LASIX) 40 MG tablet Take 40 mg by mouth 2 (two) times daily.  Marland Kitchen glucagon (GLUCAGEN) 1 MG SOLR injection Inject 1 mg into the muscle once as needed for low blood sugar. Reported on 11/16/2015  . guaiFENesin (ROBITUSSIN) 100 MG/5ML SOLN Take 5 mLs by mouth every 4 (four) hours as needed for cough or to loosen phlegm.  . insulin aspart protamine - aspart (NOVOLOG 70/30 MIX) (70-30) 100 UNIT/ML FlexPen Inject 0.05 mLs (5 Units total) into the skin 2 (two) times daily. (Patient taking  differently: Inject 5 Units into the skin 2 (two) times daily. If CBG under 70- no insulin. Per Wife)  . lisinopril (PRINIVIL,ZESTRIL) 10 MG tablet TAKE 1 TABLET BY MOUTH EVERY DAY (Patient taking differently: Take 10 mg by mouth daily. )  . pantoprazole (PROTONIX) 40 MG tablet Take 1 tablet (40 mg total) by mouth daily with lunch.  . pregabalin (LYRICA) 150 MG capsule TAKE ONE CAPSULE BY MOUTH EVERY MORNING AND 2 CAPSULES EVERY NIGHT AT BEDTIME (Patient taking differently: Take 150-300 mg by mouth See admin instructions. Tale 150 mg in the morning and 300 mg in the evening)  . Propylene Glycol (SYSTANE BALANCE OP) Place 1-2 drops into both eyes daily as needed (dry eyes).   . rosuvastatin (CRESTOR) 20 MG tablet TAKE 1 TABLET(20 MG) BY MOUTH DAILY (Patient taking differently: Take 20 mg by mouth  daily. )  . traMADol (ULTRAM) 50 MG tablet Take 1 tablet (50 mg total) by mouth every 6 (six) hours as needed (for pain).  . traZODone (DESYREL) 100 MG tablet Take 100 mg by mouth at bedtime.    No facility-administered encounter medications on file as of 07/07/2019.    Teodoro Spray, NP

## 2019-07-12 NOTE — Progress Notes (Addendum)
Virtual Visit via telephone note, because video was not working  I connected with Jaime [name] on 07/13/19 at  8:15 AM EST by audio Doximity call because video is not working.  I verified that I am speaking with the correct person using two identifiers.   I discussed the limitations of evaluation and management by telemedicine and the availability of in person appointments. The patient expressed understanding and agreed to proceed.  Present for the visit:  Myself, Dr Billey Gosling, [name] and his wife Jaime [name].  The patient is currently at home and I am in the office.    No referring provider.    History of Present Illness: He is here for follow up from the hospital   Admitted 12/4 - 12/8   He was taken to the ED for feeling weak and having a poor appetite.  In the ED he was hypotensive with SBP in 60's.  UA was consistent with UTI.  He was started on broad spectrum antibiotics.  Urine culture showed acinetobacter calcoaceticus/baumannii and klebsiella.  Acinetobacter was not sensitive to cephalosporins, but patient had clinical improvement,which means that was likely colonization.  Discussed with ID.  Patient improved clinically and discharged home.    Severe sepsis: Secondary to UTI Complicated by AKI and hypotension and lactic acidosis Blood cultures NGTD Started on Vaco, cefepime, flagyl - changed to cefepime BP improved Lactic acid improved Urine culture showed - acinetobacter calcoaceticus/baumannii and klebsiella Improved with treatment of UTI  AKI: Likely prerenal in setting of sepsis Resolved  Chronic systolic heart failure: EF 30-35% BNP 66 Resume lisinopril, lasix Continue to monitor volume status  Multiple sclerosis, bedbound at baseline: Continued on baclofen  Diabetes, type 2, well controlled: a1c 7.7% SS insulin  History of complete heart block s/p PPM, CAD, PVD s/p b/l AKA: Stable Troponin negative Continued on ASA, crestor   His  wife provides most of the history.  Since leaving the hospital his appetite has not been all that great.  He is sleeping more.  He is having some difficulty speaking-he goes to speak but nothing comes out.  His neurologist confirm this was part of the multiple sclerosis and will likely get worse.  His wife states that he still has some labored breathing at rest.  This was similar to what he had prior to going into the hospital and it has not worsened since coming out of the hospital.  Yesterday he did have some darker urine, but today it is clearer.  He has fluid retention in his chest and legs - it has not gotten worse since he was discharged.    BP stable but varies at home.  Sugars well controlled - running 90-95.  He has not had a BM x 3 days - his wife will give him miralax.     Review of Systems  Constitutional: Positive for malaise/fatigue. Negative for fever.  Respiratory: Positive for shortness of breath. Negative for cough and wheezing.   Cardiovascular: Positive for leg swelling. Negative for chest pain.  Genitourinary:       Darker urine yesterday - better today  Neurological: Positive for headaches (occ).       Social History   Socioeconomic History  . Marital status: Married    Spouse name: Meir Jentsch  . Number of children: 2  . Years of education: Not on file  . Highest education level: High school graduate  Occupational History  . Occupation: disable    Employer: RETIRED  Tobacco  Use  . Smoking status: Former Smoker    Years: 50.00    Quit date: 07/22/2012    Years since quitting: 6.9  . Smokeless tobacco: Current User  . Tobacco comment: pt states that he is using E-cigs  Substance and Sexual Activity  . Alcohol use: No    Alcohol/week: 0.0 standard drinks  . Drug use: No  . Sexual activity: Not Currently    Comment: electronic cigarettes no nicotene  Other Topics Concern  . Not on file  Social History Narrative   Lives w/ wife   Social Determinants  of Health   Financial Resource Strain: Low Risk   . Difficulty of Paying Living Expenses: Not hard at all  Food Insecurity: No Food Insecurity  . Worried About Charity fundraiser in the Last Year: Never true  . Ran Out of Food in the Last Year: Never true  Transportation Needs: No Transportation Needs  . Lack of Transportation (Medical): No  . Lack of Transportation (Non-Medical): No  Physical Activity: Inactive  . Days of Exercise per Week: 0 days  . Minutes of Exercise per Session: 0 min  Stress:   . Feeling of Stress : Not on file  Social Connections: Not Isolated  . Frequency of Communication with Friends and Family: More than three times a week  . Frequency of Social Gatherings with Friends and Family: Three times a week  . Attends Religious Services: 1 to 4 times per year  . Active Member of Clubs or Organizations: Yes  . Attends Archivist Meetings: 1 to 4 times per year  . Marital Status: Married     Observations/Objective: Appears well in NAD   Lab Results  Component Value Date   WBC 5.1 06/27/2019   HGB 11.5 (L) 06/27/2019   HCT 36.1 (L) 06/27/2019   PLT 140 (L) 06/27/2019   GLUCOSE 132 (H) 06/29/2019   CHOL 110 11/25/2017   TRIG 68.0 11/25/2017   HDL 32.60 (L) 11/25/2017   LDLDIRECT 123.5 03/17/2008   LDLCALC 64 11/25/2017   ALT 10 06/26/2019   AST 12 (L) 06/26/2019   NA 137 06/29/2019   K 3.8 06/29/2019   CL 108 06/29/2019   CREATININE 0.69 06/29/2019   BUN 23 06/29/2019   CO2 22 06/29/2019   TSH 0.581 11/16/2018   PSA 2.31 04/04/2010   INR 1.27 07/01/2016   HGBA1C 7.7 (H) 06/26/2019   MICROALBUR 3.6 (H) 07/31/2007    DG Chest Port 1 View CLINICAL DATA:  Hypotension.  EXAM: PORTABLE CHEST 1 VIEW  COMPARISON:  05/21/2019  FINDINGS: Left chest wall pacer is noted with leads in the right atrial appendage, right ventricle and coronary sinus. Normal heart size. No pleural effusion or edema. Lungs are clear.  IMPRESSION: No active  cardiopulmonary abnormalities.  Electronically Signed   By: Kerby Moors M.D.   On: 06/25/2019 20:10  Assessment and Plan:  See Problem List for Assessment and Plan of chronic medical problems.   Follow Up Instructions:    I discussed the assessment and treatment plan with the patient. The patient was provided an opportunity to ask questions and all were answered. The patient agreed with the plan and demonstrated an understanding of the instructions.   The patient was advised to call back or seek an in-person evaluation if the symptoms worsen or if the condition fails to improve as anticipated.  Time spent on telephone call: 30 minutes  [name], MD

## 2019-07-13 ENCOUNTER — Encounter: Payer: Self-pay | Admitting: Internal Medicine

## 2019-07-13 ENCOUNTER — Ambulatory Visit (INDEPENDENT_AMBULATORY_CARE_PROVIDER_SITE_OTHER): Payer: Medicare Other | Admitting: Internal Medicine

## 2019-07-13 DIAGNOSIS — I10 Essential (primary) hypertension: Secondary | ICD-10-CM | POA: Diagnosis not present

## 2019-07-13 DIAGNOSIS — R6520 Severe sepsis without septic shock: Secondary | ICD-10-CM

## 2019-07-13 DIAGNOSIS — I5022 Chronic systolic (congestive) heart failure: Secondary | ICD-10-CM | POA: Diagnosis not present

## 2019-07-13 DIAGNOSIS — Z794 Long term (current) use of insulin: Secondary | ICD-10-CM

## 2019-07-13 DIAGNOSIS — E1159 Type 2 diabetes mellitus with other circulatory complications: Secondary | ICD-10-CM

## 2019-07-13 DIAGNOSIS — N179 Acute kidney failure, unspecified: Secondary | ICD-10-CM | POA: Diagnosis not present

## 2019-07-13 DIAGNOSIS — A419 Sepsis, unspecified organism: Secondary | ICD-10-CM

## 2019-07-13 NOTE — Assessment & Plan Note (Signed)
Related to urosepsis Resolved with holding Lasix, lisinopril, IV fluids and antibiotics

## 2019-07-13 NOTE — Assessment & Plan Note (Signed)
Related to UTI Complicated by AKI, hypotension lactic acidosis Blood cultures were negative Symptoms overall improved with antibiotics, IV fluids

## 2019-07-13 NOTE — Assessment & Plan Note (Signed)
Sugars well controlled at home Continue current regimen

## 2019-07-13 NOTE — Assessment & Plan Note (Signed)
Blood pressure varies at home, overall is stable His wife does monitor it daily Continue current medications

## 2019-07-13 NOTE — Assessment & Plan Note (Addendum)
EF 30-35% BNP in hospital 66 Lasix initially held, but restarted His wife states he does have some puffiness in his chest and legs-not worsening Advised her to monitor this closely since he is at risk for getting fluid overloaded Not able to visualize patient since this is a telephone call She will call cardiology if he seems to be retaining more fluid

## 2019-07-27 ENCOUNTER — Other Ambulatory Visit: Payer: Self-pay | Admitting: *Deleted

## 2019-07-27 NOTE — Patient Outreach (Signed)
[facility]) Care Management  07/27/2019  MARREO USELMAN [birth year] [id number]   Call placed to member's caregiver/wife to follow up on management of medical condition, no answer.  HIPAA compliant voice message left.  Will follow up within the next 3-4 business days.  [name], [place], MSN [place] [phone]

## 2019-07-29 ENCOUNTER — Telehealth: Payer: Self-pay | Admitting: Internal Medicine

## 2019-07-29 ENCOUNTER — Other Ambulatory Visit: Payer: Self-pay | Admitting: *Deleted

## 2019-07-29 ENCOUNTER — Other Ambulatory Visit: Payer: Self-pay

## 2019-07-29 ENCOUNTER — Other Ambulatory Visit: Payer: Medicare Other | Admitting: Hospice

## 2019-07-29 DIAGNOSIS — F0390 Unspecified dementia without behavioral disturbance: Secondary | ICD-10-CM

## 2019-07-29 DIAGNOSIS — Z515 Encounter for palliative care: Secondary | ICD-10-CM

## 2019-07-29 NOTE — Progress Notes (Signed)
[facility] Consult Note Telephone: [phone]  Fax: [phone]  PATIENT NAME: Jaime [name] DOB: [birth year] MRN: [id number]  PRIMARY CARE PROVIDER:   [name], MD  REFERRING PROVIDER:  [name], MD [place],  [place] [address]  [place], wife [phone]  TELEHEALTH VISIT STATEMENT Due to the COVID-19 crisis, this visit was done via telephone from my office. It was initiated and consented to by this patient and/or family.  RECOMMENDATIONS/PLAN:  Advance Care Planning/Goals of Care:  Spouse - [name] - reported patient continuing to decline in functional status related to his Multiple Sclerosis and Dementia; family seeking for Hospice services. Patient previously a full code now request DNR status per spouse. DNR form signed and mailed to patient as requested; Signed DNR form uploaded in [place] today. Spouse grieving patient's decline and how he continues to get worse despite her care. Emotional supportand therapeutic listening provided.  Goals of care include to maximize quality of life and symptom management.Discussed with [facility] CMO and he affirmed patient is eligible for hospice services. NP reached out to PCP with update on patient's status and family request for hospice services; expecting a call back.  Symptom management:Worsening confusion and memory loss r/t Dementia  FAST 7c, cannot sit up independently without propping up; Bedbound, worsening weakness. Spouse now feeds patient. Ongoing supportive care encouraged.  Follow [id number] care will continue to follow patient for goals of care clarification and symptom management. I spent17minutes providing this consultation, from 11.30am to 12.30pm. More than 50% of the time in this consultation was spent on coordinating communication  HISTORY OF PRESENT ILLNESS:Jaime E Spinksis a 79 y.o.year  oldmalewith multiple medical problems including Dementia, DMT2, CAD, CHF, MS, has pacemaker placement. Palliative Care was asked to help address goals of care.Marland Kitchen   CODE STATUS: DNR  PPS: weak 30% HOSPICE ELIGIBILITY/DIAGNOSIS: TBD  PAST MEDICAL HISTORY:  Past Medical History:  Diagnosis Date  . Atherosclerotic PVD with ulceration ([place])    left foot  . Bacteremia   . CAD (coronary artery disease) 2007   Dr Meda Coffee -  non-STEMI, .occluded circumflex.. Taxus stent placed...residual 80% LAD...50% RCA  . Cardiomyopathy, ischemic    EF 45% per ECHO 2008  //   EF 25%, echo, August, 2013 //  Echo (8/15):  Mild LVH, EF 30-35%, ant-lat and lat AK, inf HK, Gr 1 DD, mild MR, mild LAE  . Carotid artery disease ([place])    a.  Doppler, February, 2012, 0-39% bilateral,Mild smooth plaque;  b.  Carotid US (8/15):  Bilateral 1-39% ICA >>> F/u 2 years  . Constipation   . Dementia ([place])   . Diabetes mellitus    INSULIN-DEPENDent  . Fatigue SEVERE  . H/O pleural effusion 2008   POST THORACENTESIS  . History of colon polyps PRECANCEROUS  . Hyperlipidemia   . Hypertension   . Impotence   . Increased prostate specific antigen (PSA) velocity   . Insomnia   . Lung nodule    resolved 11-2006 CT Chest  . Multiple sclerosis ([place]) [place] -- LAST VISIT 11-20-2010  NOTE W/ CHART  . PAC (premature atrial contraction)    December, 2013  . Pulmonary hypertension ([place])    moderate ECHO Jan 2008  . Scoliosis associated with other condition   . Second degree Mobitz I AV block 11/12/2018  . Sleep apnea   . Symptomatic bradycardia 11/13/2018  . Systolic heart  failure   . TIA (transient ischemic attack)   . Tobacco abuse    quit   . Urinary retention    dx ~ 2-12, like from [place], now with a catheter, saw urology  . Urinary tract infection    hx of    SOCIAL HX:  Social History   Tobacco Use  . Smoking status: Former Smoker    Years: 50.00    Quit date: 07/22/2012    Years since  quitting: 7.0  . Smokeless tobacco: Current User  . Tobacco comment: pt states that he is using E-cigs  Substance Use Topics  . Alcohol use: No    Alcohol/week: 0.0 standard drinks    ALLERGIES:  Allergies  Allergen Reactions  . Bee Venom Anaphylaxis  . Influenza Vaccines Other (See Comments)    Made patient "sick for months"  . Atorvastatin Other (See Comments)    Severe pain in stumps  . Latex Rash     PERTINENT MEDICATIONS:  Outpatient Encounter Medications as of 07/29/2019  Medication Sig  . albuterol (PROVENTIL HFA;VENTOLIN HFA) 108 (90 Base) MCG/ACT inhaler Inhale 2 puffs into the lungs every 6 (six) hours as needed for wheezing or shortness of breath.  Marland Kitchen amoxicillin (AMOXIL) 500 MG tablet Take 4 tablets (2,000 mg) 30 minutes prior to dental procedure  . aspirin EC 81 MG tablet Take 1 tablet (81 mg total) by mouth daily.  . baclofen (LIORESAL) 10 MG tablet Take 0.5 tablets (5 mg total) by mouth 2 (two) times daily.  . carvedilol (COREG) 3.125 MG tablet Take 1 tablet (3.125 mg total) by mouth 2 (two) times daily with a meal.  . cetirizine (ZYRTEC) 10 MG tablet Take 10 mg by mouth daily.  . Cholecalciferol (VITAMIN D-3) 1000 units CAPS Take 2,000 Units by mouth daily.  . diclofenac sodium (VOLTAREN) 1 % GEL Apply 1 application topically 2 (two) times daily as needed (for pain).   . furosemide (LASIX) 40 MG tablet Take 40 mg by mouth 2 (two) times daily.  Marland Kitchen glucagon (GLUCAGEN) 1 MG SOLR injection Inject 1 mg into the muscle once as needed for low blood sugar. Reported on 11/16/2015  . guaiFENesin (ROBITUSSIN) 100 MG/5ML SOLN Take 5 mLs by mouth every 4 (four) hours as needed for cough or to loosen phlegm.  . insulin aspart protamine - aspart (NOVOLOG 70/30 MIX) (70-30) 100 UNIT/ML FlexPen Inject 0.05 mLs (5 Units total) into the skin 2 (two) times daily. (Patient taking differently: Inject 5 Units into the skin 2 (two) times daily. If CBG under 70- no insulin. Per Wife)  . lisinopril  (PRINIVIL,ZESTRIL) 10 MG tablet TAKE 1 TABLET BY MOUTH EVERY DAY (Patient taking differently: Take 10 mg by mouth daily. )  . pantoprazole (PROTONIX) 40 MG tablet Take 1 tablet (40 mg total) by mouth daily with lunch.  . pregabalin (LYRICA) 150 MG capsule TAKE ONE CAPSULE BY MOUTH EVERY MORNING AND 2 CAPSULES EVERY NIGHT AT BEDTIME (Patient taking differently: Take 150-300 mg by mouth See admin instructions. Tale 150 mg in the morning and 300 mg in the evening)  . Propylene Glycol (SYSTANE BALANCE OP) Place 1-2 drops into both eyes daily as needed (dry eyes).   . rosuvastatin (CRESTOR) 20 MG tablet TAKE 1 TABLET(20 MG) BY MOUTH DAILY (Patient taking differently: Take 20 mg by mouth daily. )  . traMADol (ULTRAM) 50 MG tablet Take 1 tablet (50 mg total) by mouth every 6 (six) hours as needed (for pain).  . traZODone (DESYREL)  100 MG tablet Take 100 mg by mouth at bedtime.    No facility-administered encounter medications on file as of 07/29/2019.    Teodoro Spray, NP

## 2019-07-29 NOTE — Telephone Encounter (Signed)
I agree

## 2019-07-29 NOTE — Telephone Encounter (Signed)
Copied from [place] [phone]. Topic: General - Other >> Jul 29, 2019  3:54 PM Leward Quan A wrote: Reason for CRM: Laverda Sorenson  NP from Palliative care called to speak to Dr Quay Burow wanted to inform her that patients health have declined tremendously and need her permission to place him on hospice care. Please call Walker Shadow at Ph# [phone]

## 2019-07-29 NOTE — Patient Outreach (Signed)
Jaime [name]) Care Management  07/29/2019  Jaime [name] 12/11/1940 [id number]   Call received from member's wife today stating member is not doing well.  State he is becoming non-coherent and having hallucinations.  Report he is becoming weaker and now having to be fed.  He was wheezing last night and swelling starting to increase, urine also starting to be cloudy again.  Vitals reportedly stable - BP 120/72, HR 61, CBG 124.  She has called Dr. Altamese Dilling office to request home visit, waiting on call back.  Admits she is at the point where she is recognizing that member will not get better and she's in need of more assistance.  He is already active with palliative care but now she feel his is ready for hospice.  She has contact information for L. Eshiet, NP with [facility] and will call her to transition member to hospice.  She will discuss whether to place member at facility for hospice care or keep him at home.  This care manager will follow up with wife within the next week to confirm member has transitioned to hospice.  [name], [place], MSN [place] [phone]

## 2019-07-30 NOTE — Telephone Encounter (Signed)
Called Jaime [name] and gave response below. She states she will notify referral department and they should send [place] whatever they need to get the orders sent.

## 2019-08-02 ENCOUNTER — Telehealth: Payer: Self-pay

## 2019-08-02 ENCOUNTER — Other Ambulatory Visit: Payer: Self-pay | Admitting: *Deleted

## 2019-08-02 DIAGNOSIS — E785 Hyperlipidemia, unspecified: Secondary | ICD-10-CM | POA: Diagnosis not present

## 2019-08-02 DIAGNOSIS — I110 Hypertensive heart disease with heart failure: Secondary | ICD-10-CM | POA: Diagnosis not present

## 2019-08-02 DIAGNOSIS — I442 Atrioventricular block, complete: Secondary | ICD-10-CM | POA: Diagnosis not present

## 2019-08-02 DIAGNOSIS — Z950 Presence of cardiac pacemaker: Secondary | ICD-10-CM | POA: Diagnosis not present

## 2019-08-02 DIAGNOSIS — I70203 Unspecified atherosclerosis of native arteries of extremities, bilateral legs: Secondary | ICD-10-CM | POA: Diagnosis not present

## 2019-08-02 DIAGNOSIS — Z89611 Acquired absence of right leg above knee: Secondary | ICD-10-CM | POA: Diagnosis not present

## 2019-08-02 DIAGNOSIS — E1142 Type 2 diabetes mellitus with diabetic polyneuropathy: Secondary | ICD-10-CM | POA: Diagnosis not present

## 2019-08-02 DIAGNOSIS — F0150 Vascular dementia without behavioral disturbance: Secondary | ICD-10-CM | POA: Diagnosis not present

## 2019-08-02 DIAGNOSIS — N390 Urinary tract infection, site not specified: Secondary | ICD-10-CM | POA: Diagnosis not present

## 2019-08-02 DIAGNOSIS — Z89612 Acquired absence of left leg above knee: Secondary | ICD-10-CM | POA: Diagnosis not present

## 2019-08-02 DIAGNOSIS — H04129 Dry eye syndrome of unspecified lacrimal gland: Secondary | ICD-10-CM | POA: Diagnosis not present

## 2019-08-02 DIAGNOSIS — Z794 Long term (current) use of insulin: Secondary | ICD-10-CM | POA: Diagnosis not present

## 2019-08-02 DIAGNOSIS — K219 Gastro-esophageal reflux disease without esophagitis: Secondary | ICD-10-CM | POA: Diagnosis not present

## 2019-08-02 DIAGNOSIS — J302 Other seasonal allergic rhinitis: Secondary | ICD-10-CM | POA: Diagnosis not present

## 2019-08-02 DIAGNOSIS — G35 Multiple sclerosis: Secondary | ICD-10-CM | POA: Diagnosis not present

## 2019-08-02 DIAGNOSIS — I69818 Other symptoms and signs involving cognitive functions following other cerebrovascular disease: Secondary | ICD-10-CM | POA: Diagnosis not present

## 2019-08-02 DIAGNOSIS — I509 Heart failure, unspecified: Secondary | ICD-10-CM | POA: Diagnosis not present

## 2019-08-02 DIAGNOSIS — I2510 Atherosclerotic heart disease of native coronary artery without angina pectoris: Secondary | ICD-10-CM | POA: Diagnosis not present

## 2019-08-02 NOTE — Patient Outreach (Signed)
[facility]) Care Management  08/02/2019  ODES PINT 1941/04/16 [id number]   Call placed to member to follow up on contact with [facility].  Noted that permission was granted by PCP to have member transition to hospice.  Wife report having home visit scheduled for this morning at 10 to complete the transition to the hospice program.  She inquires about having living will completed, advised to to request social worker with program to help.  If unable to do this, she will notify this care manager and will request [facility] social worker.  Will close case at this time due to enrollment in hospice.  Will notify primary MD of case closure.  [name], [place], MSN [place] [phone]

## 2019-08-02 NOTE — Telephone Encounter (Signed)
Copied from [place] [phone]. Topic: General - Other >> Aug 02, 2019  4:27 PM Erick Blinks wrote: Jaime [name] has been accepted with hospice and palliative care, they will be relaying notes to PCP.  Wife called to inform

## 2019-08-03 DIAGNOSIS — I110 Hypertensive heart disease with heart failure: Secondary | ICD-10-CM | POA: Diagnosis not present

## 2019-08-03 DIAGNOSIS — I70203 Unspecified atherosclerosis of native arteries of extremities, bilateral legs: Secondary | ICD-10-CM | POA: Diagnosis not present

## 2019-08-03 DIAGNOSIS — I2510 Atherosclerotic heart disease of native coronary artery without angina pectoris: Secondary | ICD-10-CM | POA: Diagnosis not present

## 2019-08-03 DIAGNOSIS — I509 Heart failure, unspecified: Secondary | ICD-10-CM | POA: Diagnosis not present

## 2019-08-03 DIAGNOSIS — I442 Atrioventricular block, complete: Secondary | ICD-10-CM | POA: Diagnosis not present

## 2019-08-03 DIAGNOSIS — F0150 Vascular dementia without behavioral disturbance: Secondary | ICD-10-CM | POA: Diagnosis not present

## 2019-08-04 DIAGNOSIS — N390 Urinary tract infection, site not specified: Secondary | ICD-10-CM | POA: Diagnosis not present

## 2019-08-06 DIAGNOSIS — I442 Atrioventricular block, complete: Secondary | ICD-10-CM | POA: Diagnosis not present

## 2019-08-06 DIAGNOSIS — F0150 Vascular dementia without behavioral disturbance: Secondary | ICD-10-CM | POA: Diagnosis not present

## 2019-08-06 DIAGNOSIS — I2510 Atherosclerotic heart disease of native coronary artery without angina pectoris: Secondary | ICD-10-CM | POA: Diagnosis not present

## 2019-08-06 DIAGNOSIS — I70203 Unspecified atherosclerosis of native arteries of extremities, bilateral legs: Secondary | ICD-10-CM | POA: Diagnosis not present

## 2019-08-06 DIAGNOSIS — I110 Hypertensive heart disease with heart failure: Secondary | ICD-10-CM | POA: Diagnosis not present

## 2019-08-06 DIAGNOSIS — I509 Heart failure, unspecified: Secondary | ICD-10-CM | POA: Diagnosis not present

## 2019-08-10 DIAGNOSIS — I70203 Unspecified atherosclerosis of native arteries of extremities, bilateral legs: Secondary | ICD-10-CM | POA: Diagnosis not present

## 2019-08-10 DIAGNOSIS — I442 Atrioventricular block, complete: Secondary | ICD-10-CM | POA: Diagnosis not present

## 2019-08-10 DIAGNOSIS — I110 Hypertensive heart disease with heart failure: Secondary | ICD-10-CM | POA: Diagnosis not present

## 2019-08-10 DIAGNOSIS — F0150 Vascular dementia without behavioral disturbance: Secondary | ICD-10-CM | POA: Diagnosis not present

## 2019-08-10 DIAGNOSIS — I2510 Atherosclerotic heart disease of native coronary artery without angina pectoris: Secondary | ICD-10-CM | POA: Diagnosis not present

## 2019-08-10 DIAGNOSIS — I509 Heart failure, unspecified: Secondary | ICD-10-CM | POA: Diagnosis not present

## 2019-08-11 ENCOUNTER — Ambulatory Visit (INDEPENDENT_AMBULATORY_CARE_PROVIDER_SITE_OTHER): Payer: Medicare Other | Admitting: *Deleted

## 2019-08-11 DIAGNOSIS — I442 Atrioventricular block, complete: Secondary | ICD-10-CM

## 2019-08-11 LAB — CUP PACEART REMOTE DEVICE CHECK
Battery Remaining Longevity: 79 mo
Battery Remaining Percentage: 95.5 %
Battery Voltage: 2.99 V
Brady Statistic AP VP Percent: 84 %
Brady Statistic AP VS Percent: 1 %
Brady Statistic AS VP Percent: 16 %
Brady Statistic AS VS Percent: 1 %
Brady Statistic RA Percent Paced: 83 %
Date Time Interrogation Session: 20210120020009
Implantable Lead Implant Date: 20200423
Implantable Lead Implant Date: 20200423
Implantable Lead Implant Date: 20200423
Implantable Lead Location: 753858
Implantable Lead Location: 753859
Implantable Lead Location: 753860
Implantable Pulse Generator Implant Date: 20200423
Lead Channel Impedance Value: 360 Ohm
Lead Channel Impedance Value: 540 Ohm
Lead Channel Impedance Value: 580 Ohm
Lead Channel Pacing Threshold Amplitude: 0.625 V
Lead Channel Pacing Threshold Amplitude: 0.625 V
Lead Channel Pacing Threshold Amplitude: 1.75 V
Lead Channel Pacing Threshold Pulse Width: 0.5 ms
Lead Channel Pacing Threshold Pulse Width: 0.5 ms
Lead Channel Pacing Threshold Pulse Width: 0.5 ms
Lead Channel Sensing Intrinsic Amplitude: 12 mV
Lead Channel Sensing Intrinsic Amplitude: 3.1 mV
Lead Channel Setting Pacing Amplitude: 1.625
Lead Channel Setting Pacing Amplitude: 2 V
Lead Channel Setting Pacing Amplitude: 2.75 V
Lead Channel Setting Pacing Pulse Width: 0.5 ms
Lead Channel Setting Pacing Pulse Width: 0.5 ms
Lead Channel Setting Sensing Sensitivity: 2 mV
Pulse Gen Serial Number: 9116598

## 2019-08-17 DIAGNOSIS — F0150 Vascular dementia without behavioral disturbance: Secondary | ICD-10-CM | POA: Diagnosis not present

## 2019-08-17 DIAGNOSIS — I110 Hypertensive heart disease with heart failure: Secondary | ICD-10-CM | POA: Diagnosis not present

## 2019-08-17 DIAGNOSIS — I2510 Atherosclerotic heart disease of native coronary artery without angina pectoris: Secondary | ICD-10-CM | POA: Diagnosis not present

## 2019-08-17 DIAGNOSIS — I70203 Unspecified atherosclerosis of native arteries of extremities, bilateral legs: Secondary | ICD-10-CM | POA: Diagnosis not present

## 2019-08-17 DIAGNOSIS — I509 Heart failure, unspecified: Secondary | ICD-10-CM | POA: Diagnosis not present

## 2019-08-17 DIAGNOSIS — I442 Atrioventricular block, complete: Secondary | ICD-10-CM | POA: Diagnosis not present

## 2019-08-18 DIAGNOSIS — I110 Hypertensive heart disease with heart failure: Secondary | ICD-10-CM | POA: Diagnosis not present

## 2019-08-18 DIAGNOSIS — I2510 Atherosclerotic heart disease of native coronary artery without angina pectoris: Secondary | ICD-10-CM | POA: Diagnosis not present

## 2019-08-18 DIAGNOSIS — I70203 Unspecified atherosclerosis of native arteries of extremities, bilateral legs: Secondary | ICD-10-CM | POA: Diagnosis not present

## 2019-08-18 DIAGNOSIS — I442 Atrioventricular block, complete: Secondary | ICD-10-CM | POA: Diagnosis not present

## 2019-08-18 DIAGNOSIS — I509 Heart failure, unspecified: Secondary | ICD-10-CM | POA: Diagnosis not present

## 2019-08-18 DIAGNOSIS — F0150 Vascular dementia without behavioral disturbance: Secondary | ICD-10-CM | POA: Diagnosis not present

## 2019-08-20 DIAGNOSIS — I110 Hypertensive heart disease with heart failure: Secondary | ICD-10-CM | POA: Diagnosis not present

## 2019-08-20 DIAGNOSIS — I442 Atrioventricular block, complete: Secondary | ICD-10-CM | POA: Diagnosis not present

## 2019-08-20 DIAGNOSIS — F0150 Vascular dementia without behavioral disturbance: Secondary | ICD-10-CM | POA: Diagnosis not present

## 2019-08-20 DIAGNOSIS — I2510 Atherosclerotic heart disease of native coronary artery without angina pectoris: Secondary | ICD-10-CM | POA: Diagnosis not present

## 2019-08-20 DIAGNOSIS — I509 Heart failure, unspecified: Secondary | ICD-10-CM | POA: Diagnosis not present

## 2019-08-20 DIAGNOSIS — I70203 Unspecified atherosclerosis of native arteries of extremities, bilateral legs: Secondary | ICD-10-CM | POA: Diagnosis not present

## 2019-08-23 ENCOUNTER — Telehealth: Payer: Self-pay | Admitting: Internal Medicine

## 2019-08-23 DIAGNOSIS — Z89611 Acquired absence of right leg above knee: Secondary | ICD-10-CM | POA: Diagnosis not present

## 2019-08-23 DIAGNOSIS — K219 Gastro-esophageal reflux disease without esophagitis: Secondary | ICD-10-CM | POA: Diagnosis not present

## 2019-08-23 DIAGNOSIS — G35 Multiple sclerosis: Secondary | ICD-10-CM | POA: Diagnosis not present

## 2019-08-23 DIAGNOSIS — I69818 Other symptoms and signs involving cognitive functions following other cerebrovascular disease: Secondary | ICD-10-CM | POA: Diagnosis not present

## 2019-08-23 DIAGNOSIS — Z794 Long term (current) use of insulin: Secondary | ICD-10-CM | POA: Diagnosis not present

## 2019-08-23 DIAGNOSIS — E1142 Type 2 diabetes mellitus with diabetic polyneuropathy: Secondary | ICD-10-CM | POA: Diagnosis not present

## 2019-08-23 DIAGNOSIS — E785 Hyperlipidemia, unspecified: Secondary | ICD-10-CM | POA: Diagnosis not present

## 2019-08-23 DIAGNOSIS — F0150 Vascular dementia without behavioral disturbance: Secondary | ICD-10-CM | POA: Diagnosis not present

## 2019-08-23 DIAGNOSIS — I509 Heart failure, unspecified: Secondary | ICD-10-CM | POA: Diagnosis not present

## 2019-08-23 DIAGNOSIS — I70203 Unspecified atherosclerosis of native arteries of extremities, bilateral legs: Secondary | ICD-10-CM | POA: Diagnosis not present

## 2019-08-23 DIAGNOSIS — I2510 Atherosclerotic heart disease of native coronary artery without angina pectoris: Secondary | ICD-10-CM | POA: Diagnosis not present

## 2019-08-23 DIAGNOSIS — H04129 Dry eye syndrome of unspecified lacrimal gland: Secondary | ICD-10-CM | POA: Diagnosis not present

## 2019-08-23 DIAGNOSIS — Z89612 Acquired absence of left leg above knee: Secondary | ICD-10-CM | POA: Diagnosis not present

## 2019-08-23 DIAGNOSIS — I110 Hypertensive heart disease with heart failure: Secondary | ICD-10-CM | POA: Diagnosis not present

## 2019-08-23 DIAGNOSIS — I442 Atrioventricular block, complete: Secondary | ICD-10-CM | POA: Diagnosis not present

## 2019-08-23 DIAGNOSIS — N390 Urinary tract infection, site not specified: Secondary | ICD-10-CM | POA: Diagnosis not present

## 2019-08-23 DIAGNOSIS — J302 Other seasonal allergic rhinitis: Secondary | ICD-10-CM | POA: Diagnosis not present

## 2019-08-23 DIAGNOSIS — Z950 Presence of cardiac pacemaker: Secondary | ICD-10-CM | POA: Diagnosis not present

## 2019-08-23 NOTE — Telephone Encounter (Signed)
Lockie Mola is refaxing orders to direct fax number.

## 2019-08-23 NOTE — Telephone Encounter (Signed)
Is calling back in reference to orders.

## 2019-08-24 DIAGNOSIS — I2510 Atherosclerotic heart disease of native coronary artery without angina pectoris: Secondary | ICD-10-CM | POA: Diagnosis not present

## 2019-08-24 DIAGNOSIS — F0150 Vascular dementia without behavioral disturbance: Secondary | ICD-10-CM | POA: Diagnosis not present

## 2019-08-24 DIAGNOSIS — I442 Atrioventricular block, complete: Secondary | ICD-10-CM | POA: Diagnosis not present

## 2019-08-24 DIAGNOSIS — I110 Hypertensive heart disease with heart failure: Secondary | ICD-10-CM | POA: Diagnosis not present

## 2019-08-24 DIAGNOSIS — I70203 Unspecified atherosclerosis of native arteries of extremities, bilateral legs: Secondary | ICD-10-CM | POA: Diagnosis not present

## 2019-08-24 DIAGNOSIS — I509 Heart failure, unspecified: Secondary | ICD-10-CM | POA: Diagnosis not present

## 2019-08-31 DIAGNOSIS — I70203 Unspecified atherosclerosis of native arteries of extremities, bilateral legs: Secondary | ICD-10-CM | POA: Diagnosis not present

## 2019-08-31 DIAGNOSIS — I2510 Atherosclerotic heart disease of native coronary artery without angina pectoris: Secondary | ICD-10-CM | POA: Diagnosis not present

## 2019-08-31 DIAGNOSIS — I110 Hypertensive heart disease with heart failure: Secondary | ICD-10-CM | POA: Diagnosis not present

## 2019-08-31 DIAGNOSIS — F0150 Vascular dementia without behavioral disturbance: Secondary | ICD-10-CM | POA: Diagnosis not present

## 2019-08-31 DIAGNOSIS — I442 Atrioventricular block, complete: Secondary | ICD-10-CM | POA: Diagnosis not present

## 2019-08-31 DIAGNOSIS — I509 Heart failure, unspecified: Secondary | ICD-10-CM | POA: Diagnosis not present

## 2019-09-03 ENCOUNTER — Telehealth: Payer: Self-pay

## 2019-09-03 NOTE — Telephone Encounter (Signed)
  Cologuard recall letter has been mailed to patient's home address on file.

## 2019-09-07 DIAGNOSIS — I509 Heart failure, unspecified: Secondary | ICD-10-CM | POA: Diagnosis not present

## 2019-09-07 DIAGNOSIS — F0150 Vascular dementia without behavioral disturbance: Secondary | ICD-10-CM | POA: Diagnosis not present

## 2019-09-07 DIAGNOSIS — I110 Hypertensive heart disease with heart failure: Secondary | ICD-10-CM | POA: Diagnosis not present

## 2019-09-07 DIAGNOSIS — I2510 Atherosclerotic heart disease of native coronary artery without angina pectoris: Secondary | ICD-10-CM | POA: Diagnosis not present

## 2019-09-07 DIAGNOSIS — I442 Atrioventricular block, complete: Secondary | ICD-10-CM | POA: Diagnosis not present

## 2019-09-07 DIAGNOSIS — I70203 Unspecified atherosclerosis of native arteries of extremities, bilateral legs: Secondary | ICD-10-CM | POA: Diagnosis not present

## 2019-09-08 DIAGNOSIS — I110 Hypertensive heart disease with heart failure: Secondary | ICD-10-CM | POA: Diagnosis not present

## 2019-09-08 DIAGNOSIS — I2510 Atherosclerotic heart disease of native coronary artery without angina pectoris: Secondary | ICD-10-CM | POA: Diagnosis not present

## 2019-09-08 DIAGNOSIS — F0150 Vascular dementia without behavioral disturbance: Secondary | ICD-10-CM | POA: Diagnosis not present

## 2019-09-08 DIAGNOSIS — I509 Heart failure, unspecified: Secondary | ICD-10-CM | POA: Diagnosis not present

## 2019-09-08 DIAGNOSIS — I442 Atrioventricular block, complete: Secondary | ICD-10-CM | POA: Diagnosis not present

## 2019-09-08 DIAGNOSIS — I70203 Unspecified atherosclerosis of native arteries of extremities, bilateral legs: Secondary | ICD-10-CM | POA: Diagnosis not present

## 2019-09-11 DIAGNOSIS — G35 Multiple sclerosis: Secondary | ICD-10-CM | POA: Diagnosis not present

## 2019-09-11 DIAGNOSIS — I10 Essential (primary) hypertension: Secondary | ICD-10-CM | POA: Diagnosis not present

## 2019-09-11 DIAGNOSIS — L853 Xerosis cutis: Secondary | ICD-10-CM | POA: Diagnosis not present

## 2019-09-11 DIAGNOSIS — J189 Pneumonia, unspecified organism: Secondary | ICD-10-CM | POA: Diagnosis not present

## 2019-09-13 ENCOUNTER — Other Ambulatory Visit: Payer: Self-pay | Admitting: Neurology

## 2019-09-15 DIAGNOSIS — I70203 Unspecified atherosclerosis of native arteries of extremities, bilateral legs: Secondary | ICD-10-CM | POA: Diagnosis not present

## 2019-09-15 DIAGNOSIS — I442 Atrioventricular block, complete: Secondary | ICD-10-CM | POA: Diagnosis not present

## 2019-09-15 DIAGNOSIS — I509 Heart failure, unspecified: Secondary | ICD-10-CM | POA: Diagnosis not present

## 2019-09-15 DIAGNOSIS — I2510 Atherosclerotic heart disease of native coronary artery without angina pectoris: Secondary | ICD-10-CM | POA: Diagnosis not present

## 2019-09-15 DIAGNOSIS — F0150 Vascular dementia without behavioral disturbance: Secondary | ICD-10-CM | POA: Diagnosis not present

## 2019-09-15 DIAGNOSIS — I110 Hypertensive heart disease with heart failure: Secondary | ICD-10-CM | POA: Diagnosis not present

## 2019-09-20 DIAGNOSIS — I509 Heart failure, unspecified: Secondary | ICD-10-CM | POA: Diagnosis not present

## 2019-09-20 DIAGNOSIS — K219 Gastro-esophageal reflux disease without esophagitis: Secondary | ICD-10-CM | POA: Diagnosis not present

## 2019-09-20 DIAGNOSIS — F0150 Vascular dementia without behavioral disturbance: Secondary | ICD-10-CM | POA: Diagnosis not present

## 2019-09-20 DIAGNOSIS — I69818 Other symptoms and signs involving cognitive functions following other cerebrovascular disease: Secondary | ICD-10-CM | POA: Diagnosis not present

## 2019-09-20 DIAGNOSIS — Z89612 Acquired absence of left leg above knee: Secondary | ICD-10-CM | POA: Diagnosis not present

## 2019-09-20 DIAGNOSIS — Z89611 Acquired absence of right leg above knee: Secondary | ICD-10-CM | POA: Diagnosis not present

## 2019-09-20 DIAGNOSIS — G35 Multiple sclerosis: Secondary | ICD-10-CM | POA: Diagnosis not present

## 2019-09-20 DIAGNOSIS — Z950 Presence of cardiac pacemaker: Secondary | ICD-10-CM | POA: Diagnosis not present

## 2019-09-20 DIAGNOSIS — Z794 Long term (current) use of insulin: Secondary | ICD-10-CM | POA: Diagnosis not present

## 2019-09-20 DIAGNOSIS — N390 Urinary tract infection, site not specified: Secondary | ICD-10-CM | POA: Diagnosis not present

## 2019-09-20 DIAGNOSIS — I442 Atrioventricular block, complete: Secondary | ICD-10-CM | POA: Diagnosis not present

## 2019-09-20 DIAGNOSIS — E1142 Type 2 diabetes mellitus with diabetic polyneuropathy: Secondary | ICD-10-CM | POA: Diagnosis not present

## 2019-09-20 DIAGNOSIS — I110 Hypertensive heart disease with heart failure: Secondary | ICD-10-CM | POA: Diagnosis not present

## 2019-09-20 DIAGNOSIS — J302 Other seasonal allergic rhinitis: Secondary | ICD-10-CM | POA: Diagnosis not present

## 2019-09-20 DIAGNOSIS — I70203 Unspecified atherosclerosis of native arteries of extremities, bilateral legs: Secondary | ICD-10-CM | POA: Diagnosis not present

## 2019-09-20 DIAGNOSIS — E785 Hyperlipidemia, unspecified: Secondary | ICD-10-CM | POA: Diagnosis not present

## 2019-09-20 DIAGNOSIS — H04129 Dry eye syndrome of unspecified lacrimal gland: Secondary | ICD-10-CM | POA: Diagnosis not present

## 2019-09-20 DIAGNOSIS — I2510 Atherosclerotic heart disease of native coronary artery without angina pectoris: Secondary | ICD-10-CM | POA: Diagnosis not present

## 2019-09-23 ENCOUNTER — Ambulatory Visit: Payer: Medicare Other | Admitting: Neurology

## 2019-09-28 ENCOUNTER — Encounter: Payer: Self-pay | Admitting: Neurology

## 2019-09-28 NOTE — Progress Notes (Signed)
[name] Key: Jonna Munro - PA Case ID: [id number] Need help? Call us at [phone] Status Sent to [place] Drug traMADol HCl 50MG  tablets Form [employer] Electronic PA Form

## 2019-09-29 DIAGNOSIS — I2510 Atherosclerotic heart disease of native coronary artery without angina pectoris: Secondary | ICD-10-CM | POA: Diagnosis not present

## 2019-09-29 DIAGNOSIS — I70203 Unspecified atherosclerosis of native arteries of extremities, bilateral legs: Secondary | ICD-10-CM | POA: Diagnosis not present

## 2019-09-29 DIAGNOSIS — F0150 Vascular dementia without behavioral disturbance: Secondary | ICD-10-CM | POA: Diagnosis not present

## 2019-09-29 DIAGNOSIS — I442 Atrioventricular block, complete: Secondary | ICD-10-CM | POA: Diagnosis not present

## 2019-09-29 DIAGNOSIS — I509 Heart failure, unspecified: Secondary | ICD-10-CM | POA: Diagnosis not present

## 2019-09-29 DIAGNOSIS — I110 Hypertensive heart disease with heart failure: Secondary | ICD-10-CM | POA: Diagnosis not present

## 2019-09-30 DIAGNOSIS — I2510 Atherosclerotic heart disease of native coronary artery without angina pectoris: Secondary | ICD-10-CM | POA: Diagnosis not present

## 2019-09-30 DIAGNOSIS — I70203 Unspecified atherosclerosis of native arteries of extremities, bilateral legs: Secondary | ICD-10-CM | POA: Diagnosis not present

## 2019-09-30 DIAGNOSIS — I110 Hypertensive heart disease with heart failure: Secondary | ICD-10-CM | POA: Diagnosis not present

## 2019-09-30 DIAGNOSIS — I509 Heart failure, unspecified: Secondary | ICD-10-CM | POA: Diagnosis not present

## 2019-09-30 DIAGNOSIS — I442 Atrioventricular block, complete: Secondary | ICD-10-CM | POA: Diagnosis not present

## 2019-09-30 DIAGNOSIS — F0150 Vascular dementia without behavioral disturbance: Secondary | ICD-10-CM | POA: Diagnosis not present

## 2019-10-04 NOTE — Progress Notes (Signed)
[name] Key: Jonna Munro - PA Case ID: [id number] Need help? Call us at [phone] Outcome Deniedon March 9 This request was denied under your Medicare Part D benefit; however, it may be covered under Medicare Part A. For more information, talk to your prescriber or call 1-800-MEDICARE. You only have Part D coverage with [employer]. [employer] follows Medicare rules. The Medicare rule in Chapter 6 of the Prescription Drug Manual says that drugs covered under the Part A (Inpatient) benefit cannot be covered under Part D. The information we have about your case is that you are getting this drug while in a facility that is covered by Part A. [employer] has determined that the requested drug cannot be billed separately under your Part D benefit. If you think Medicare Part D should cover this drug for you, you may appeal. Drug traMADol HCl 50MG  tablets Form [employer] Electronic PA Form

## 2019-10-07 DIAGNOSIS — I509 Heart failure, unspecified: Secondary | ICD-10-CM | POA: Diagnosis not present

## 2019-10-07 DIAGNOSIS — I442 Atrioventricular block, complete: Secondary | ICD-10-CM | POA: Diagnosis not present

## 2019-10-07 DIAGNOSIS — I110 Hypertensive heart disease with heart failure: Secondary | ICD-10-CM | POA: Diagnosis not present

## 2019-10-07 DIAGNOSIS — I70203 Unspecified atherosclerosis of native arteries of extremities, bilateral legs: Secondary | ICD-10-CM | POA: Diagnosis not present

## 2019-10-07 DIAGNOSIS — I2510 Atherosclerotic heart disease of native coronary artery without angina pectoris: Secondary | ICD-10-CM | POA: Diagnosis not present

## 2019-10-07 DIAGNOSIS — F0150 Vascular dementia without behavioral disturbance: Secondary | ICD-10-CM | POA: Diagnosis not present

## 2019-10-08 DIAGNOSIS — I70203 Unspecified atherosclerosis of native arteries of extremities, bilateral legs: Secondary | ICD-10-CM | POA: Diagnosis not present

## 2019-10-08 DIAGNOSIS — I2510 Atherosclerotic heart disease of native coronary artery without angina pectoris: Secondary | ICD-10-CM | POA: Diagnosis not present

## 2019-10-08 DIAGNOSIS — F0150 Vascular dementia without behavioral disturbance: Secondary | ICD-10-CM | POA: Diagnosis not present

## 2019-10-08 DIAGNOSIS — I509 Heart failure, unspecified: Secondary | ICD-10-CM | POA: Diagnosis not present

## 2019-10-08 DIAGNOSIS — I110 Hypertensive heart disease with heart failure: Secondary | ICD-10-CM | POA: Diagnosis not present

## 2019-10-08 DIAGNOSIS — I442 Atrioventricular block, complete: Secondary | ICD-10-CM | POA: Diagnosis not present

## 2019-10-11 NOTE — Progress Notes (Signed)
Virtual Visit via Telephone Note   This visit type was conducted due to national recommendations for restrictions regarding the COVID-19 Pandemic (e.g. social distancing) in an effort to limit this patient's exposure and mitigate transmission in our community.  Due to his co-morbid illnesses, this patient is at least at moderate risk for complications without adequate follow up.  This format is felt to be most appropriate for this patient at this time.  The patient did not have access to video technology/had technical difficulties with video requiring transitioning to audio format only (telephone).  All issues noted in this document were discussed and addressed.  No physical exam could be performed with this format.  Please refer to the patient's chart for his  consent to telehealth for [facility].   The patient was identified using 2 identifiers.  Date:  10/13/2019   ID:  Jaime [name], DOB 02-15-1941, MRN [id number]  Patient Location: Home Provider Location: Home  PCP:  [name], MD  Cardiologist:  [name], MD   Electrophysiologist:  None   Evaluation Performed:  Follow-Up Visit  Chief Complaint: follow up  History of Present Illness:    Jaime [name] is a 79 y.o. 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 They stopped his carvedilol because of hypotension. Denies chest pain. Keeps gaining a lot of fluid and edema and they have been adjusting lasix to bid for 3-4 days then back to once a day. They can't weigh him because he is bilateral amputee and bedridden.  Swelling usually in his trunk. She is asking about the covid19 vaccine as he is allergic to flu vaccine.  The patient does not have symptoms concerning for COVID-19 infection (fever, chills, cough, or new shortness of breath).    Past Medical History:  Diagnosis Date  . Atherosclerotic PVD with ulceration ([place])    left foot  . Bacteremia   . CAD (coronary artery disease) 2007   Dr Meda Coffee -  non-STEMI, .occluded circumflex.. Taxus stent placed...residual 80% LAD...50% RCA  . Cardiomyopathy, ischemic    EF 45% per ECHO 2008  //   EF 25%, echo, August, 2013 //  Echo (8/15):  Mild LVH, EF 30-35%, ant-lat and lat AK, inf HK, Gr 1 DD, mild MR, mild LAE  . Carotid artery disease ([place])    a.  Doppler, February, 2012, 0-39% bilateral,Mild smooth plaque;  b.  Carotid US (8/15):  Bilateral 1-39% ICA >>> F/u 2 years  . Constipation   . Dementia ([place])   . Diabetes mellitus    INSULIN-DEPENDent  . Fatigue SEVERE  . H/O pleural effusion 2008   POST THORACENTESIS  . History of colon polyps PRECANCEROUS  . Hyperlipidemia   . Hypertension   . Impotence   . Increased prostate specific antigen (PSA) velocity   . Insomnia   . Lung nodule    resolved 11-2006 CT Chest  . Multiple sclerosis ([place]) [place] -- LAST VISIT 11-20-2010  NOTE W/ CHART  . PAC (premature atrial contraction)    December, 2013  . Pulmonary hypertension ([place])  moderate ECHO Jan 2008  . Scoliosis associated with other condition   . Second degree Mobitz I AV block 11/12/2018  . Sleep apnea   . Symptomatic bradycardia 11/13/2018  . Systolic heart failure   . TIA (transient ischemic attack)   . Tobacco abuse    quit   . Urinary retention    dx ~ 2-12, like from [place], now with a catheter, saw urology  . Urinary tract infection    hx of   Past Surgical History:  Procedure Laterality Date  . ABDOMINAL ANGIOGRAM  12/17/2011   Procedure: ABDOMINAL ANGIOGRAM;  Surgeon: Serafina Mitchell, MD;  Location:  [facility] CATH LAB;  Service: Cardiovascular;;  . ABDOMINAL AORTAGRAM N/A 08/19/2013   Procedure: ABDOMINAL Maxcine Ham;  Surgeon: Conrad Livingston, MD;  Location: [facility] CATH LAB;  Service: Cardiovascular;  Laterality: N/A;  . AMPUTATION  03/17/2012   Procedure: AMPUTATION ABOVE KNEE;  Surgeon: Conrad [place], MD;  Location: [place];  Service: Vascular;  Laterality: Left;  . AMPUTATION  06/03/2012   Procedure: AMPUTATION ABOVE KNEE;  Surgeon: Conrad Ruffin, MD;  Location: [place];  Service: Vascular;  Laterality: Right;  . BIV PACEMAKER INSERTION CRT-P N/A 11/12/2018   Procedure: BIV PACEMAKER INSERTION CRT-P;  Surgeon: Evans Lance, MD;  Location: [place] CV LAB;  Service: Cardiovascular;  Laterality: N/A;  . CHOLECYSTECTOMY N/A 10/25/2014   Procedure: LAPAROSCOPIC CHOLECYSTECTOMY ;  Surgeon: Coralie Keens, MD;  Location: [place];  Service: General;  Laterality: N/A;  . CORONARY ANGIOPLASTY WITH STENT PLACEMENT  05-01-2006   OCCLUDED CIRCUMFLEX -- TAXUS STENT PLACMENT  AND RESIDUAL 80% LAD,  50% RCA  . CYSTOSCOPY  07/30/2011   Procedure: CYSTOSCOPY;  Surgeon: Hanley Ben, MD;  Location[facility];  Service: Urology;  Laterality: N/A;  . ERCP N/A 08/25/2014   Procedure: ENDOSCOPIC RETROGRADE CHOLANGIOPANCREATOGRAPHY (ERCP);  Surgeon: Ladene Artist, MD;  Location: [facility] ENDOSCOPY;  Service: Endoscopy;  Laterality: N/A;  . FEMORAL-POPLITEAL BYPASS GRAFT  03/11/2012   Procedure: BYPASS GRAFT FEMORAL-POPLITEAL ARTERY;  Surgeon: Conrad [place], MD;  Location: [place];  Service: Vascular;  Laterality: Left;  embolectomy left lower leg  . FEMORAL-TIBIAL BYPASS GRAFT  03/11/2012   Procedure: BYPASS GRAFT FEMORAL-TIBIAL ARTERY;  Surgeon: Conrad Hanalei, MD;  Location: [facility] OR;  Service: Vascular;  Laterality: Left;  Left Femoral -Tibial trunk bypass, Endarterectomy of Tibial- Peroneal trunk with vein angioplasty.  Marland Kitchen HERNIA REPAIR  1990   (R)  . INTRAOPERATIVE ARTERIOGRAM  03/11/2012   Procedure: INTRA OPERATIVE  ARTERIOGRAM;  Surgeon: Conrad , MD;  Location: [place];  Service: Vascular;  Laterality: Left;  . LOWER EXTREMITY ANGIOGRAM Bilateral 12/17/2011   Procedure: LOWER EXTREMITY ANGIOGRAM;  Surgeon: Serafina Mitchell, MD;  Location: [facility] CATH LAB;  Service: Cardiovascular;  Laterality: Bilateral;  bil lower extrem angio  . THORACENTESIS  2008   PLEURAL EFFUSION  . TRANSURETHRAL RESECTION OF PROSTATE  07/30/2011   Procedure: TRANSURETHRAL RESECTION OF THE PROSTATE (TURP);  Surgeon: Hanley Ben, MD;  Location: [facility];  Service: Urology;  Laterality: N/A;     Current Meds  Medication Sig  . albuterol (PROVENTIL HFA;VENTOLIN HFA) 108 (90 Base) MCG/ACT inhaler Inhale 2 puffs into the lungs every 6 (six) hours as needed for wheezing or shortness of breath.  Marland Kitchen aspirin EC 81 MG tablet Take 1 tablet (81 mg total) by mouth daily.  . baclofen (LIORESAL) 10 MG tablet Take 10 mg by mouth 2 (two)  times daily.  . cetirizine (ZYRTEC) 10 MG tablet Take 10 mg by mouth daily.  . Cholecalciferol (VITAMIN D-3) 1000 units CAPS Take 2,000 Units by mouth daily.  . diclofenac sodium (VOLTAREN) 1 % GEL Apply 1 application topically 2 (two) times daily as needed (for pain).   Marland Kitchen doxycycline (VIBRAMYCIN) 100 MG capsule Take 100 mg by mouth 2 (two) times daily. For 5 days for cold sx  . furosemide (LASIX) 40 MG tablet Take 40 mg by mouth 2 (two) times daily.  Marland Kitchen glucagon (GLUCAGEN) 1 MG SOLR injection Inject 1 mg into the muscle once as needed for low blood sugar. Reported on 11/16/2015  . guaiFENesin (ROBITUSSIN) 100 MG/5ML SOLN Take 5 mLs by mouth every 4 (four) hours as needed for cough or to loosen phlegm.  . insulin aspart protamine - aspart (NOVOLOG 70/30 MIX) (70-30) 100 UNIT/ML FlexPen Inject 0.05 mLs (5 Units total) into the skin 2 (two) times daily.  Marland Kitchen lisinopril (PRINIVIL,ZESTRIL) 10 MG tablet TAKE 1 TABLET BY MOUTH EVERY DAY  . pantoprazole (PROTONIX) 40 MG tablet Take 1 tablet (40 mg total) by  mouth daily with lunch.  . pregabalin (LYRICA) 150 MG capsule TAKE 1 CAPSULE BY MOUTH EVERY MORNING AND 2 CAPSULES EVERY NIGHT AT BEDTIME  . Propylene Glycol (SYSTANE BALANCE OP) Place 1-2 drops into both eyes daily as needed (dry eyes).   . rosuvastatin (CRESTOR) 20 MG tablet TAKE 1 TABLET(20 MG) BY MOUTH DAILY  . traMADol (ULTRAM) 50 MG tablet Take 1 tablet (50 mg total) by mouth every 6 (six) hours as needed (for pain).  . traZODone (DESYREL) 100 MG tablet Take 100 mg by mouth at bedtime.      Allergies:   Bee venom, Influenza vaccines, Atorvastatin, and Latex   Social History   Tobacco Use  . Smoking status: Former Smoker    Years: 50.00    Quit date: 07/22/2012    Years since quitting: 7.2  . Smokeless tobacco: Current User  . Tobacco comment: pt states that he is using E-cigs  Substance Use Topics  . Alcohol use: No    Alcohol/week: 0.0 standard drinks  . Drug use: No     Family Hx: The patient's family history includes COPD in his father; Diabetes in his brother and daughter; Heart attack in his brother; Heart attack (age of onset: 78) in his mother; Heart disease in his brother and mother; Hyperlipidemia in his mother; Hypertension in his brother and mother; Peripheral vascular disease in his father; Stroke in his mother. There is no history of Colon cancer or Prostate cancer.  ROS:   Please see the history of present illness.      All other systems reviewed and are negative.   Prior CV studies:   The following studies were reviewed today:  2D echo 10/2017 Study Conclusions   - Left ventricle: The cavity size was normal. There was moderate    concentric hypertrophy. Systolic function was moderately to    severely reduced. The estimated ejection fraction was in the    range of 30% to 35%. There is akinesis of the basal and mid    inferior, inferolateral and apical inferior walls. Doppler    parameters are consistent with abnormal left ventricular    relaxation (grade  1 diastolic dysfunction).  - Aortic valve: There was trivial regurgitation.  - Mitral valve: There was mild regurgitation.  - Right ventricle: The cavity size was normal. Wall thickness was    normal. Systolic function  was normal.  - Right atrium: The atrium was normal in size.  - Tricuspid valve: There was moderate regurgitation.  - Pulmonic valve: There was trivial regurgitation.  - Pulmonary arteries: Systolic pressure was mildly increased. PA    peak pressure: 39 mm Hg (S).  - Inferior vena cava: The vessel was normal in size.  - Pericardium, extracardiac: There was no pericardial effusion.   -------------------------------------------------------------------  Study data:  Comparison was made to the study of 08/14/2015.  Study  status:  Routine.  Procedure:  The patient reported no pain pre or  post test. Transthoracic echocardiography. Image quality was  adequate.  Study completion:  There were no complications.  Transthoracic echocardiography.  M-mode, complete 2D, spectral  Doppler, and color Doppler.  Birthdate:  Patient birthdate:  [birth year].  Age:  Patient is 79 yr old.  Sex:  Gender: male.  BMI: 20.1 kg/m^2.  Blood pressure:     154/78  Patient status:  Inpatient.  Study date:  Study date: 10/29/2017. Study time: 08:26  AM.  Location:  Echo laboratory.    Labs/Other Tests and Data Reviewed:    EKG:  No ECG reviewed.  Recent Labs: 11/16/2018: TSH 0.581 06/25/2019: B Natriuretic Peptide 66.0 06/26/2019: ALT 10 06/27/2019: Hemoglobin 11.5; Platelets 140 06/29/2019: BUN 23; Creatinine, Ser 0.69; Potassium 3.8; Sodium 137   Recent Lipid Panel Lab Results  Component Value Date/Time   CHOL 110 11/25/2017 11:13 AM   TRIG 68.0 11/25/2017 11:13 AM   HDL 32.60 (L) 11/25/2017 11:13 AM   CHOLHDL 3 11/25/2017 11:13 AM   LDLCALC 64 11/25/2017 11:13 AM   LDLDIRECT 123.5 03/17/2008 10:49 AM    Wt Readings from Last 3 Encounters:  06/26/19 146 lb 2.6 oz (66.3 kg)  04/01/19 148  lb 9.4 oz (67.4 kg)  02/12/19 145 lb (65.8 kg)     Objective:    Vital Signs:  BP 102/76   Pulse 71    VITAL SIGNS:  reviewed  ASSESSMENT & PLAN:    CAD status post DES to the circumflex 2007-no chest pain  Ischemic cardiomyopathy ejection fraction 30 to 35% on echo in [id number]   Chronic systolic and diastolic CHF has had trouble with edema that is being managed by hospice adjusting his lasix up/down. Now off coreg because of hypotension but still on lisinopril. Can decrease to 5 mg if needed for hypotension  Status post pacemaker 10/2018 for complete heart block and symptomatic bradycardia  Diabetes mellitus status post BKA  Hospice managing patient now. Patient's wife will call if they need anything. Asked them to discuss covid19 vaccine with PCP/hospice  COVID-19 Education: The signs and symptoms of COVID-19 were discussed with the patient and how to seek care for testing (follow up with PCP or arrange E-visit).   The importance of social distancing was discussed today.  Time:   Today, I have spent 12:20 minutes with the patient with telehealth technology discussing the above problems.     Medication Adjustments/Labs and Tests Ordered: Current medicines are reviewed at length with the patient today.  Concerns regarding medicines are outlined above.   Tests Ordered: No orders of the defined types were placed in this encounter.   Medication Changes: No orders of the defined types were placed in this encounter.   Follow Up:  Virtual Visit  in 6 month(s) Dr. Meda Coffee  Signed, Ermalinda Barrios, PA-C  10/13/2019 9:08 AM    [place]

## 2019-10-12 DIAGNOSIS — I442 Atrioventricular block, complete: Secondary | ICD-10-CM | POA: Diagnosis not present

## 2019-10-12 DIAGNOSIS — I70203 Unspecified atherosclerosis of native arteries of extremities, bilateral legs: Secondary | ICD-10-CM | POA: Diagnosis not present

## 2019-10-12 DIAGNOSIS — I2510 Atherosclerotic heart disease of native coronary artery without angina pectoris: Secondary | ICD-10-CM | POA: Diagnosis not present

## 2019-10-12 DIAGNOSIS — I110 Hypertensive heart disease with heart failure: Secondary | ICD-10-CM | POA: Diagnosis not present

## 2019-10-12 DIAGNOSIS — F0150 Vascular dementia without behavioral disturbance: Secondary | ICD-10-CM | POA: Diagnosis not present

## 2019-10-12 DIAGNOSIS — I509 Heart failure, unspecified: Secondary | ICD-10-CM | POA: Diagnosis not present

## 2019-10-13 ENCOUNTER — Other Ambulatory Visit: Payer: Self-pay

## 2019-10-13 ENCOUNTER — Telehealth: Payer: Self-pay

## 2019-10-13 ENCOUNTER — Encounter: Payer: Self-pay | Admitting: Physician Assistant

## 2019-10-13 ENCOUNTER — Telehealth (INDEPENDENT_AMBULATORY_CARE_PROVIDER_SITE_OTHER): Admitting: Physician Assistant

## 2019-10-13 VITALS — BP 102/76 | HR 71

## 2019-10-13 DIAGNOSIS — I110 Hypertensive heart disease with heart failure: Secondary | ICD-10-CM | POA: Diagnosis not present

## 2019-10-13 DIAGNOSIS — I255 Ischemic cardiomyopathy: Secondary | ICD-10-CM | POA: Diagnosis not present

## 2019-10-13 DIAGNOSIS — I5042 Chronic combined systolic (congestive) and diastolic (congestive) heart failure: Secondary | ICD-10-CM

## 2019-10-13 DIAGNOSIS — I70203 Unspecified atherosclerosis of native arteries of extremities, bilateral legs: Secondary | ICD-10-CM | POA: Diagnosis not present

## 2019-10-13 DIAGNOSIS — Z950 Presence of cardiac pacemaker: Secondary | ICD-10-CM | POA: Diagnosis not present

## 2019-10-13 DIAGNOSIS — I2510 Atherosclerotic heart disease of native coronary artery without angina pectoris: Secondary | ICD-10-CM

## 2019-10-13 DIAGNOSIS — Z794 Long term (current) use of insulin: Secondary | ICD-10-CM

## 2019-10-13 DIAGNOSIS — I509 Heart failure, unspecified: Secondary | ICD-10-CM | POA: Diagnosis not present

## 2019-10-13 DIAGNOSIS — F0150 Vascular dementia without behavioral disturbance: Secondary | ICD-10-CM | POA: Diagnosis not present

## 2019-10-13 DIAGNOSIS — I442 Atrioventricular block, complete: Secondary | ICD-10-CM | POA: Diagnosis not present

## 2019-10-13 DIAGNOSIS — E1159 Type 2 diabetes mellitus with other circulatory complications: Secondary | ICD-10-CM

## 2019-10-13 DIAGNOSIS — Z515 Encounter for palliative care: Secondary | ICD-10-CM

## 2019-10-13 NOTE — Telephone Encounter (Signed)
  Patient Consent for Virtual Visit         Jaime [name] has provided verbal consent on 10/13/2019 for a virtual visit (video or telephone).   CONSENT FOR VIRTUAL VISIT FOR:  Jaime [name]  By participating in this virtual visit I agree to the following:  I hereby voluntarily request, consent and authorize [facility] and its employed or contracted physicians, physician assistants, nurse practitioners or other licensed health care professionals (the Practitioner), to provide me with telemedicine health care services (the "Services") as deemed necessary by the treating Practitioner. I acknowledge and consent to receive the Services by the Practitioner via telemedicine. I understand that the telemedicine visit will involve communicating with the Practitioner through live audiovisual communication technology and the disclosure of certain medical information by electronic transmission. I acknowledge that I have been given the opportunity to request an in-person assessment or other available alternative prior to the telemedicine visit and am voluntarily participating in the telemedicine visit.  I understand that I have the right to withhold or withdraw my consent to the use of telemedicine in the course of my care at any time, without affecting my right to future care or treatment, and that the Practitioner or I may terminate the telemedicine visit at any time. I understand that I have the right to inspect all information obtained and/or recorded in the course of the telemedicine visit and may receive copies of available information for a reasonable fee.  I understand that some of the potential risks of receiving the Services via telemedicine include:  Marland Kitchen Delay or interruption in medical evaluation due to technological equipment failure or disruption; . Information transmitted may not be sufficient (e.g. poor resolution of images) to allow for appropriate medical decision making by the  Practitioner; and/or  . In rare instances, security protocols could fail, causing a breach of personal health information.  Furthermore, I acknowledge that it is my responsibility to provide information about my medical history, conditions and care that is complete and accurate to the best of my ability. I acknowledge that Practitioner's advice, recommendations, and/or decision may be based on factors not within their control, such as incomplete or inaccurate data provided by me or distortions of diagnostic images or specimens that may result from electronic transmissions. I understand that the practice of medicine is not an exact science and that Practitioner makes no warranties or guarantees regarding treatment outcomes. I acknowledge that a copy of this consent can be made available to me via my patient portal ([place]), or I can request a printed copy by calling the office of [place].    I understand that my insurance will be billed for this visit.   I have read or had this consent read to me. . I understand the contents of this consent, which adequately explains the benefits and risks of the Services being provided via telemedicine.  . I have been provided ample opportunity to ask questions regarding this consent and the Services and have had my questions answered to my satisfaction. . I give my informed consent for the services to be provided through the use of telemedicine in my medical care

## 2019-10-13 NOTE — Patient Instructions (Signed)
Medication Instructions:  Your physician recommends that you continue on your current medications as directed. Please refer to the Current Medication list given to you today.  *If you need a refill on your cardiac medications before your next appointment, please call your pharmacy*   Lab Work: None ordered  If you have labs (blood work) drawn today and your tests are completely normal, you will receive your results only by: . MyChart Message (if you have MyChart) OR . A paper copy in the mail If you have any lab test that is abnormal or we need to change your treatment, we will call you to review the results.   Testing/Procedures: None ordered   Follow-Up: At [facility], you and your health needs are our priority.  As part of our continuing mission to provide you with exceptional heart care, we have created designated Provider Care Teams.  These Care Teams include your primary Cardiologist (physician) and Advanced Practice Providers (APPs -  Physician Assistants and Nurse Practitioners) who all work together to provide you with the care you need, when you need it.  We recommend signing up for the patient portal called "MyChart".  Sign up information is provided on this After Visit Summary.  MyChart is used to connect with patients for Virtual Visits (Telemedicine).  Patients are able to view lab/test results, encounter notes, upcoming appointments, etc.  Non-urgent messages can be sent to your provider as well.   To learn more about what you can do with MyChart, go to [email or web address].com.    Your next appointment:   6 month(s)  The format for your next appointment:   In Person  Provider:   You may see Katarina Nelson, MD or one of the following Advanced Practice Providers on your designated Care Team:    Dayna Dunn, PA-C  Michele Lenze, PA-C    Other Instructions   

## 2019-10-20 DIAGNOSIS — I70203 Unspecified atherosclerosis of native arteries of extremities, bilateral legs: Secondary | ICD-10-CM | POA: Diagnosis not present

## 2019-10-20 DIAGNOSIS — I509 Heart failure, unspecified: Secondary | ICD-10-CM | POA: Diagnosis not present

## 2019-10-20 DIAGNOSIS — I110 Hypertensive heart disease with heart failure: Secondary | ICD-10-CM | POA: Diagnosis not present

## 2019-10-20 DIAGNOSIS — F0150 Vascular dementia without behavioral disturbance: Secondary | ICD-10-CM | POA: Diagnosis not present

## 2019-10-20 DIAGNOSIS — I2510 Atherosclerotic heart disease of native coronary artery without angina pectoris: Secondary | ICD-10-CM | POA: Diagnosis not present

## 2019-10-20 DIAGNOSIS — I442 Atrioventricular block, complete: Secondary | ICD-10-CM | POA: Diagnosis not present

## 2019-10-21 DIAGNOSIS — F0150 Vascular dementia without behavioral disturbance: Secondary | ICD-10-CM | POA: Diagnosis not present

## 2019-10-21 DIAGNOSIS — N390 Urinary tract infection, site not specified: Secondary | ICD-10-CM | POA: Diagnosis not present

## 2019-10-21 DIAGNOSIS — G35 Multiple sclerosis: Secondary | ICD-10-CM | POA: Diagnosis not present

## 2019-10-21 DIAGNOSIS — H04129 Dry eye syndrome of unspecified lacrimal gland: Secondary | ICD-10-CM | POA: Diagnosis not present

## 2019-10-21 DIAGNOSIS — I110 Hypertensive heart disease with heart failure: Secondary | ICD-10-CM | POA: Diagnosis not present

## 2019-10-21 DIAGNOSIS — I2510 Atherosclerotic heart disease of native coronary artery without angina pectoris: Secondary | ICD-10-CM | POA: Diagnosis not present

## 2019-10-21 DIAGNOSIS — K219 Gastro-esophageal reflux disease without esophagitis: Secondary | ICD-10-CM | POA: Diagnosis not present

## 2019-10-21 DIAGNOSIS — Z794 Long term (current) use of insulin: Secondary | ICD-10-CM | POA: Diagnosis not present

## 2019-10-21 DIAGNOSIS — I442 Atrioventricular block, complete: Secondary | ICD-10-CM | POA: Diagnosis not present

## 2019-10-21 DIAGNOSIS — E785 Hyperlipidemia, unspecified: Secondary | ICD-10-CM | POA: Diagnosis not present

## 2019-10-21 DIAGNOSIS — I70203 Unspecified atherosclerosis of native arteries of extremities, bilateral legs: Secondary | ICD-10-CM | POA: Diagnosis not present

## 2019-10-21 DIAGNOSIS — Z89612 Acquired absence of left leg above knee: Secondary | ICD-10-CM | POA: Diagnosis not present

## 2019-10-21 DIAGNOSIS — I509 Heart failure, unspecified: Secondary | ICD-10-CM | POA: Diagnosis not present

## 2019-10-21 DIAGNOSIS — E1142 Type 2 diabetes mellitus with diabetic polyneuropathy: Secondary | ICD-10-CM | POA: Diagnosis not present

## 2019-10-21 DIAGNOSIS — I69818 Other symptoms and signs involving cognitive functions following other cerebrovascular disease: Secondary | ICD-10-CM | POA: Diagnosis not present

## 2019-10-21 DIAGNOSIS — J302 Other seasonal allergic rhinitis: Secondary | ICD-10-CM | POA: Diagnosis not present

## 2019-10-21 DIAGNOSIS — Z950 Presence of cardiac pacemaker: Secondary | ICD-10-CM | POA: Diagnosis not present

## 2019-10-21 DIAGNOSIS — Z89611 Acquired absence of right leg above knee: Secondary | ICD-10-CM | POA: Diagnosis not present

## 2019-10-27 DIAGNOSIS — K219 Gastro-esophageal reflux disease without esophagitis: Secondary | ICD-10-CM

## 2019-10-27 DIAGNOSIS — I110 Hypertensive heart disease with heart failure: Secondary | ICD-10-CM | POA: Diagnosis not present

## 2019-10-27 DIAGNOSIS — I509 Heart failure, unspecified: Secondary | ICD-10-CM

## 2019-10-27 DIAGNOSIS — E785 Hyperlipidemia, unspecified: Secondary | ICD-10-CM

## 2019-10-27 DIAGNOSIS — E1142 Type 2 diabetes mellitus with diabetic polyneuropathy: Secondary | ICD-10-CM

## 2019-10-27 DIAGNOSIS — I2510 Atherosclerotic heart disease of native coronary artery without angina pectoris: Secondary | ICD-10-CM | POA: Diagnosis not present

## 2019-10-27 DIAGNOSIS — I69818 Other symptoms and signs involving cognitive functions following other cerebrovascular disease: Secondary | ICD-10-CM

## 2019-10-27 DIAGNOSIS — F0150 Vascular dementia without behavioral disturbance: Secondary | ICD-10-CM | POA: Diagnosis not present

## 2019-10-27 DIAGNOSIS — I70203 Unspecified atherosclerosis of native arteries of extremities, bilateral legs: Secondary | ICD-10-CM | POA: Diagnosis not present

## 2019-10-27 DIAGNOSIS — I442 Atrioventricular block, complete: Secondary | ICD-10-CM | POA: Diagnosis not present

## 2019-10-27 DIAGNOSIS — Z87440 Personal history of urinary (tract) infections: Secondary | ICD-10-CM

## 2019-10-27 DIAGNOSIS — H04129 Dry eye syndrome of unspecified lacrimal gland: Secondary | ICD-10-CM

## 2019-10-27 DIAGNOSIS — J302 Other seasonal allergic rhinitis: Secondary | ICD-10-CM

## 2019-11-03 DIAGNOSIS — F0150 Vascular dementia without behavioral disturbance: Secondary | ICD-10-CM | POA: Diagnosis not present

## 2019-11-03 DIAGNOSIS — I70203 Unspecified atherosclerosis of native arteries of extremities, bilateral legs: Secondary | ICD-10-CM | POA: Diagnosis not present

## 2019-11-03 DIAGNOSIS — I110 Hypertensive heart disease with heart failure: Secondary | ICD-10-CM | POA: Diagnosis not present

## 2019-11-03 DIAGNOSIS — I2510 Atherosclerotic heart disease of native coronary artery without angina pectoris: Secondary | ICD-10-CM | POA: Diagnosis not present

## 2019-11-03 DIAGNOSIS — I442 Atrioventricular block, complete: Secondary | ICD-10-CM | POA: Diagnosis not present

## 2019-11-03 DIAGNOSIS — I509 Heart failure, unspecified: Secondary | ICD-10-CM | POA: Diagnosis not present

## 2019-11-07 DIAGNOSIS — I442 Atrioventricular block, complete: Secondary | ICD-10-CM | POA: Diagnosis not present

## 2019-11-07 DIAGNOSIS — I509 Heart failure, unspecified: Secondary | ICD-10-CM | POA: Diagnosis not present

## 2019-11-07 DIAGNOSIS — I70203 Unspecified atherosclerosis of native arteries of extremities, bilateral legs: Secondary | ICD-10-CM | POA: Diagnosis not present

## 2019-11-07 DIAGNOSIS — F0150 Vascular dementia without behavioral disturbance: Secondary | ICD-10-CM | POA: Diagnosis not present

## 2019-11-07 DIAGNOSIS — I110 Hypertensive heart disease with heart failure: Secondary | ICD-10-CM | POA: Diagnosis not present

## 2019-11-07 DIAGNOSIS — I2510 Atherosclerotic heart disease of native coronary artery without angina pectoris: Secondary | ICD-10-CM | POA: Diagnosis not present

## 2019-11-08 DIAGNOSIS — I2510 Atherosclerotic heart disease of native coronary artery without angina pectoris: Secondary | ICD-10-CM | POA: Diagnosis not present

## 2019-11-08 DIAGNOSIS — I442 Atrioventricular block, complete: Secondary | ICD-10-CM | POA: Diagnosis not present

## 2019-11-08 DIAGNOSIS — I509 Heart failure, unspecified: Secondary | ICD-10-CM | POA: Diagnosis not present

## 2019-11-08 DIAGNOSIS — I110 Hypertensive heart disease with heart failure: Secondary | ICD-10-CM | POA: Diagnosis not present

## 2019-11-08 DIAGNOSIS — F0150 Vascular dementia without behavioral disturbance: Secondary | ICD-10-CM | POA: Diagnosis not present

## 2019-11-08 DIAGNOSIS — I70203 Unspecified atherosclerosis of native arteries of extremities, bilateral legs: Secondary | ICD-10-CM | POA: Diagnosis not present

## 2019-11-09 DIAGNOSIS — I110 Hypertensive heart disease with heart failure: Secondary | ICD-10-CM | POA: Diagnosis not present

## 2019-11-09 DIAGNOSIS — I509 Heart failure, unspecified: Secondary | ICD-10-CM | POA: Diagnosis not present

## 2019-11-09 DIAGNOSIS — I70203 Unspecified atherosclerosis of native arteries of extremities, bilateral legs: Secondary | ICD-10-CM | POA: Diagnosis not present

## 2019-11-09 DIAGNOSIS — I442 Atrioventricular block, complete: Secondary | ICD-10-CM | POA: Diagnosis not present

## 2019-11-09 DIAGNOSIS — F0150 Vascular dementia without behavioral disturbance: Secondary | ICD-10-CM | POA: Diagnosis not present

## 2019-11-09 DIAGNOSIS — I2510 Atherosclerotic heart disease of native coronary artery without angina pectoris: Secondary | ICD-10-CM | POA: Diagnosis not present

## 2019-11-10 ENCOUNTER — Ambulatory Visit (INDEPENDENT_AMBULATORY_CARE_PROVIDER_SITE_OTHER): Admitting: *Deleted

## 2019-11-10 DIAGNOSIS — I442 Atrioventricular block, complete: Secondary | ICD-10-CM | POA: Diagnosis not present

## 2019-11-10 DIAGNOSIS — I70203 Unspecified atherosclerosis of native arteries of extremities, bilateral legs: Secondary | ICD-10-CM | POA: Diagnosis not present

## 2019-11-10 DIAGNOSIS — I110 Hypertensive heart disease with heart failure: Secondary | ICD-10-CM | POA: Diagnosis not present

## 2019-11-10 DIAGNOSIS — F0150 Vascular dementia without behavioral disturbance: Secondary | ICD-10-CM | POA: Diagnosis not present

## 2019-11-10 DIAGNOSIS — I509 Heart failure, unspecified: Secondary | ICD-10-CM | POA: Diagnosis not present

## 2019-11-10 DIAGNOSIS — I2510 Atherosclerotic heart disease of native coronary artery without angina pectoris: Secondary | ICD-10-CM | POA: Diagnosis not present

## 2019-11-10 LAB — CUP PACEART REMOTE DEVICE CHECK
Battery Remaining Longevity: 79 mo
Battery Remaining Percentage: 95.5 %
Battery Voltage: 2.99 V
Brady Statistic AP VP Percent: 84 %
Brady Statistic AP VS Percent: 1 %
Brady Statistic AS VP Percent: 16 %
Brady Statistic AS VS Percent: 1 %
Brady Statistic RA Percent Paced: 83 %
Date Time Interrogation Session: 20210421020008
Implantable Lead Implant Date: 20200423
Implantable Lead Implant Date: 20200423
Implantable Lead Implant Date: 20200423
Implantable Lead Location: 753858
Implantable Lead Location: 753859
Implantable Lead Location: 753860
Implantable Pulse Generator Implant Date: 20200423
Lead Channel Impedance Value: 360 Ohm
Lead Channel Impedance Value: 540 Ohm
Lead Channel Impedance Value: 550 Ohm
Lead Channel Pacing Threshold Amplitude: 0.625 V
Lead Channel Pacing Threshold Amplitude: 0.625 V
Lead Channel Pacing Threshold Amplitude: 1.875 V
Lead Channel Pacing Threshold Pulse Width: 0.5 ms
Lead Channel Pacing Threshold Pulse Width: 0.5 ms
Lead Channel Pacing Threshold Pulse Width: 0.5 ms
Lead Channel Sensing Intrinsic Amplitude: 12 mV
Lead Channel Sensing Intrinsic Amplitude: 3.5 mV
Lead Channel Setting Pacing Amplitude: 1.625
Lead Channel Setting Pacing Amplitude: 2 V
Lead Channel Setting Pacing Amplitude: 2.875
Lead Channel Setting Pacing Pulse Width: 0.5 ms
Lead Channel Setting Pacing Pulse Width: 0.5 ms
Lead Channel Setting Sensing Sensitivity: 2 mV
Pulse Gen Model: 3562
Pulse Gen Serial Number: 9116598

## 2019-11-10 NOTE — Progress Notes (Signed)
PPM Remote  

## 2019-11-12 DIAGNOSIS — E1165 Type 2 diabetes mellitus with hyperglycemia: Secondary | ICD-10-CM | POA: Diagnosis not present

## 2019-11-12 DIAGNOSIS — I2510 Atherosclerotic heart disease of native coronary artery without angina pectoris: Secondary | ICD-10-CM | POA: Diagnosis not present

## 2019-11-12 DIAGNOSIS — I5022 Chronic systolic (congestive) heart failure: Secondary | ICD-10-CM | POA: Diagnosis not present

## 2019-11-12 DIAGNOSIS — F0150 Vascular dementia without behavioral disturbance: Secondary | ICD-10-CM | POA: Diagnosis not present

## 2019-11-12 DIAGNOSIS — I442 Atrioventricular block, complete: Secondary | ICD-10-CM | POA: Diagnosis not present

## 2019-11-12 DIAGNOSIS — I110 Hypertensive heart disease with heart failure: Secondary | ICD-10-CM | POA: Diagnosis not present

## 2019-11-12 DIAGNOSIS — I10 Essential (primary) hypertension: Secondary | ICD-10-CM | POA: Diagnosis not present

## 2019-11-12 DIAGNOSIS — I70203 Unspecified atherosclerosis of native arteries of extremities, bilateral legs: Secondary | ICD-10-CM | POA: Diagnosis not present

## 2019-11-12 DIAGNOSIS — I509 Heart failure, unspecified: Secondary | ICD-10-CM | POA: Diagnosis not present

## 2019-11-15 DIAGNOSIS — I442 Atrioventricular block, complete: Secondary | ICD-10-CM | POA: Diagnosis not present

## 2019-11-15 DIAGNOSIS — I110 Hypertensive heart disease with heart failure: Secondary | ICD-10-CM | POA: Diagnosis not present

## 2019-11-15 DIAGNOSIS — F0150 Vascular dementia without behavioral disturbance: Secondary | ICD-10-CM | POA: Diagnosis not present

## 2019-11-15 DIAGNOSIS — I2510 Atherosclerotic heart disease of native coronary artery without angina pectoris: Secondary | ICD-10-CM | POA: Diagnosis not present

## 2019-11-15 DIAGNOSIS — I509 Heart failure, unspecified: Secondary | ICD-10-CM | POA: Diagnosis not present

## 2019-11-15 DIAGNOSIS — I70203 Unspecified atherosclerosis of native arteries of extremities, bilateral legs: Secondary | ICD-10-CM | POA: Diagnosis not present

## 2019-11-16 DIAGNOSIS — I110 Hypertensive heart disease with heart failure: Secondary | ICD-10-CM | POA: Diagnosis not present

## 2019-11-16 DIAGNOSIS — I509 Heart failure, unspecified: Secondary | ICD-10-CM | POA: Diagnosis not present

## 2019-11-16 DIAGNOSIS — I2510 Atherosclerotic heart disease of native coronary artery without angina pectoris: Secondary | ICD-10-CM | POA: Diagnosis not present

## 2019-11-16 DIAGNOSIS — I442 Atrioventricular block, complete: Secondary | ICD-10-CM | POA: Diagnosis not present

## 2019-11-16 DIAGNOSIS — F0150 Vascular dementia without behavioral disturbance: Secondary | ICD-10-CM | POA: Diagnosis not present

## 2019-11-16 DIAGNOSIS — I70203 Unspecified atherosclerosis of native arteries of extremities, bilateral legs: Secondary | ICD-10-CM | POA: Diagnosis not present

## 2019-11-17 ENCOUNTER — Telehealth: Payer: Self-pay

## 2019-11-17 DIAGNOSIS — I110 Hypertensive heart disease with heart failure: Secondary | ICD-10-CM | POA: Diagnosis not present

## 2019-11-17 DIAGNOSIS — I442 Atrioventricular block, complete: Secondary | ICD-10-CM | POA: Diagnosis not present

## 2019-11-17 DIAGNOSIS — I70203 Unspecified atherosclerosis of native arteries of extremities, bilateral legs: Secondary | ICD-10-CM | POA: Diagnosis not present

## 2019-11-17 DIAGNOSIS — F0150 Vascular dementia without behavioral disturbance: Secondary | ICD-10-CM | POA: Diagnosis not present

## 2019-11-17 DIAGNOSIS — I2510 Atherosclerotic heart disease of native coronary artery without angina pectoris: Secondary | ICD-10-CM | POA: Diagnosis not present

## 2019-11-17 DIAGNOSIS — I509 Heart failure, unspecified: Secondary | ICD-10-CM | POA: Diagnosis not present

## 2019-11-17 NOTE — Telephone Encounter (Signed)
New message    Hospice calling   Medical Order needs to be fax 3/28 & 4/23   Please advise

## 2019-11-18 NOTE — Telephone Encounter (Signed)
Called Brenda and LVM advising to refax orders.  

## 2019-11-19 DIAGNOSIS — I2510 Atherosclerotic heart disease of native coronary artery without angina pectoris: Secondary | ICD-10-CM | POA: Diagnosis not present

## 2019-11-19 DIAGNOSIS — I70203 Unspecified atherosclerosis of native arteries of extremities, bilateral legs: Secondary | ICD-10-CM | POA: Diagnosis not present

## 2019-11-19 DIAGNOSIS — I110 Hypertensive heart disease with heart failure: Secondary | ICD-10-CM | POA: Diagnosis not present

## 2019-11-19 DIAGNOSIS — I442 Atrioventricular block, complete: Secondary | ICD-10-CM | POA: Diagnosis not present

## 2019-11-19 DIAGNOSIS — F0150 Vascular dementia without behavioral disturbance: Secondary | ICD-10-CM | POA: Diagnosis not present

## 2019-11-19 DIAGNOSIS — I509 Heart failure, unspecified: Secondary | ICD-10-CM | POA: Diagnosis not present

## 2019-11-20 DIAGNOSIS — G35 Multiple sclerosis: Secondary | ICD-10-CM | POA: Diagnosis not present

## 2019-11-20 DIAGNOSIS — I442 Atrioventricular block, complete: Secondary | ICD-10-CM | POA: Diagnosis not present

## 2019-11-20 DIAGNOSIS — I70203 Unspecified atherosclerosis of native arteries of extremities, bilateral legs: Secondary | ICD-10-CM | POA: Diagnosis not present

## 2019-11-20 DIAGNOSIS — Z89612 Acquired absence of left leg above knee: Secondary | ICD-10-CM | POA: Diagnosis not present

## 2019-11-20 DIAGNOSIS — E1142 Type 2 diabetes mellitus with diabetic polyneuropathy: Secondary | ICD-10-CM | POA: Diagnosis not present

## 2019-11-20 DIAGNOSIS — I509 Heart failure, unspecified: Secondary | ICD-10-CM | POA: Diagnosis not present

## 2019-11-20 DIAGNOSIS — Z794 Long term (current) use of insulin: Secondary | ICD-10-CM | POA: Diagnosis not present

## 2019-11-20 DIAGNOSIS — Z87440 Personal history of urinary (tract) infections: Secondary | ICD-10-CM | POA: Diagnosis not present

## 2019-11-20 DIAGNOSIS — H04129 Dry eye syndrome of unspecified lacrimal gland: Secondary | ICD-10-CM | POA: Diagnosis not present

## 2019-11-20 DIAGNOSIS — I69818 Other symptoms and signs involving cognitive functions following other cerebrovascular disease: Secondary | ICD-10-CM | POA: Diagnosis not present

## 2019-11-20 DIAGNOSIS — Z950 Presence of cardiac pacemaker: Secondary | ICD-10-CM | POA: Diagnosis not present

## 2019-11-20 DIAGNOSIS — I2510 Atherosclerotic heart disease of native coronary artery without angina pectoris: Secondary | ICD-10-CM | POA: Diagnosis not present

## 2019-11-20 DIAGNOSIS — F0150 Vascular dementia without behavioral disturbance: Secondary | ICD-10-CM | POA: Diagnosis not present

## 2019-11-20 DIAGNOSIS — K219 Gastro-esophageal reflux disease without esophagitis: Secondary | ICD-10-CM | POA: Diagnosis not present

## 2019-11-20 DIAGNOSIS — I110 Hypertensive heart disease with heart failure: Secondary | ICD-10-CM | POA: Diagnosis not present

## 2019-11-20 DIAGNOSIS — J302 Other seasonal allergic rhinitis: Secondary | ICD-10-CM | POA: Diagnosis not present

## 2019-11-20 DIAGNOSIS — Z89611 Acquired absence of right leg above knee: Secondary | ICD-10-CM | POA: Diagnosis not present

## 2019-11-20 DIAGNOSIS — E785 Hyperlipidemia, unspecified: Secondary | ICD-10-CM | POA: Diagnosis not present

## 2019-11-24 DIAGNOSIS — F0150 Vascular dementia without behavioral disturbance: Secondary | ICD-10-CM | POA: Diagnosis not present

## 2019-11-24 DIAGNOSIS — I2510 Atherosclerotic heart disease of native coronary artery without angina pectoris: Secondary | ICD-10-CM | POA: Diagnosis not present

## 2019-11-24 DIAGNOSIS — I509 Heart failure, unspecified: Secondary | ICD-10-CM | POA: Diagnosis not present

## 2019-11-24 DIAGNOSIS — I442 Atrioventricular block, complete: Secondary | ICD-10-CM | POA: Diagnosis not present

## 2019-11-24 DIAGNOSIS — I110 Hypertensive heart disease with heart failure: Secondary | ICD-10-CM | POA: Diagnosis not present

## 2019-11-24 DIAGNOSIS — I70203 Unspecified atherosclerosis of native arteries of extremities, bilateral legs: Secondary | ICD-10-CM | POA: Diagnosis not present

## 2019-11-30 ENCOUNTER — Other Ambulatory Visit: Payer: Self-pay

## 2019-11-30 DIAGNOSIS — I509 Heart failure, unspecified: Secondary | ICD-10-CM | POA: Diagnosis not present

## 2019-11-30 DIAGNOSIS — F0150 Vascular dementia without behavioral disturbance: Secondary | ICD-10-CM | POA: Diagnosis not present

## 2019-11-30 DIAGNOSIS — I2510 Atherosclerotic heart disease of native coronary artery without angina pectoris: Secondary | ICD-10-CM | POA: Diagnosis not present

## 2019-11-30 DIAGNOSIS — I70203 Unspecified atherosclerosis of native arteries of extremities, bilateral legs: Secondary | ICD-10-CM | POA: Diagnosis not present

## 2019-11-30 DIAGNOSIS — I442 Atrioventricular block, complete: Secondary | ICD-10-CM | POA: Diagnosis not present

## 2019-11-30 DIAGNOSIS — I110 Hypertensive heart disease with heart failure: Secondary | ICD-10-CM | POA: Diagnosis not present

## 2019-11-30 MED ORDER — BACLOFEN 10 MG PO TABS: 10.0000 mg | ORAL_TABLET | Freq: Two times a day (BID) | ORAL | 6 refills | Status: AC

## 2019-12-01 DIAGNOSIS — F0150 Vascular dementia without behavioral disturbance: Secondary | ICD-10-CM | POA: Diagnosis not present

## 2019-12-01 DIAGNOSIS — I2510 Atherosclerotic heart disease of native coronary artery without angina pectoris: Secondary | ICD-10-CM | POA: Diagnosis not present

## 2019-12-01 DIAGNOSIS — I442 Atrioventricular block, complete: Secondary | ICD-10-CM | POA: Diagnosis not present

## 2019-12-01 DIAGNOSIS — I110 Hypertensive heart disease with heart failure: Secondary | ICD-10-CM | POA: Diagnosis not present

## 2019-12-01 DIAGNOSIS — I509 Heart failure, unspecified: Secondary | ICD-10-CM | POA: Diagnosis not present

## 2019-12-01 DIAGNOSIS — I70203 Unspecified atherosclerosis of native arteries of extremities, bilateral legs: Secondary | ICD-10-CM | POA: Diagnosis not present

## 2019-12-02 ENCOUNTER — Telehealth: Payer: Self-pay | Admitting: Cardiology

## 2019-12-02 NOTE — Telephone Encounter (Signed)
Patient passed away yesterday. Patient's wife, [name], would like to let Dr. Meda Coffee and Dr. Lovena Le know. She also wants to know what to do with the monitor that went with his pace maker. Please advise.

## 2019-12-02 NOTE — Telephone Encounter (Signed)
Please express my deepest condolences to Mrs. Politte and the entire family.  It was a privilege to take care of such as with patient as her husband.  We will keep them in our thoughts and prayers. [name], MD

## 2019-12-02 NOTE — Telephone Encounter (Signed)
Will also route this message to our Medical Records Dept, so that they can cancel any future appts with our Practice and update the pts chart accordingly.

## 2019-12-03 ENCOUNTER — Telehealth: Payer: Self-pay

## 2019-12-03 NOTE — Telephone Encounter (Signed)
Medical records updated the pts chart accordingly, to reflect he is now deceased.  All appts with our facility are cancelled as well by Creta Levin in Medical Records.  Device clinic to further assist with getting the pts return monitor kit, for his device.

## 2019-12-03 NOTE — Telephone Encounter (Signed)
LMOVM for wife to call my direct office number. I needed a address to send a return kit to.

## 2019-12-08 ENCOUNTER — Telehealth: Payer: Self-pay | Admitting: Emergency Medicine

## 2019-12-08 NOTE — Telephone Encounter (Signed)
Spoke to [place] ([facility]), offered condolences. Informed that she can unplug the home monitor box and address verified where to send return kit. Informed if she has any further questions or concerns to please call DC back. Verbalizes understanding.   Email sent to [facility] regarding return kit to the following address.   [address] [place], [place] [address]

## 2019-12-08 NOTE — Telephone Encounter (Signed)
[place] dropped off death certificate. Placed in MD's box to be signed.

## 2019-12-09 NOTE — Telephone Encounter (Signed)
Death certificate signed by MD and funeral home called to pick up.

## 2019-12-21 DEATH — deceased

## 2020-04-22 IMAGING — CT CT ABD-PELV W/ CM
2 of 5 series · 16 of 46 positions shown, 18 images · IV contrast (Omni 300)
Comparison: Ultrasound 03/28/2018, CT 10/31/2014

CLINICAL DATA: Bacteremia

EXAM:
CT ABDOMEN AND PELVIS WITH CONTRAST
TECHNIQUE: Multidetector CT imaging of the abdomen and pelvis was performed
using the standard protocol following bolus administration of
intravenous contrast.
CONTRAST:  100mL HULG9L-UPP IOPAMIDOL (HULG9L-UPP) INJECTION 61%

[Series 3: a/p w/ 5mm · axial · 0.91mm/px · z∈[-473,-43]mm · 13 of 96 slices shown, 15 images]
[im 5/96  soft-tissue]
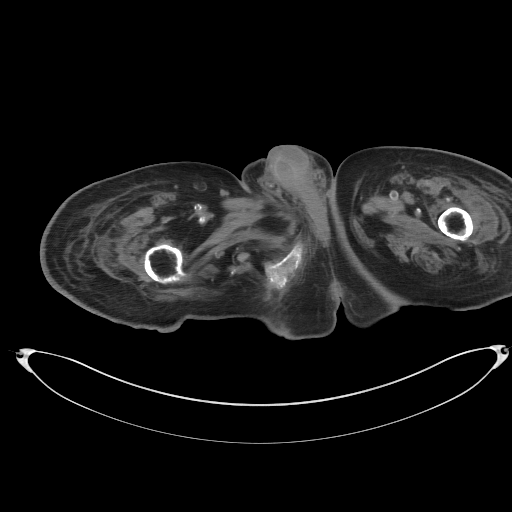
[im 5/96  bone]
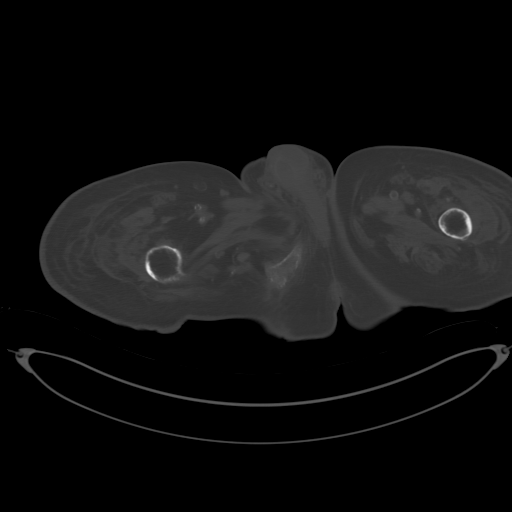
[im 15/96  soft-tissue]
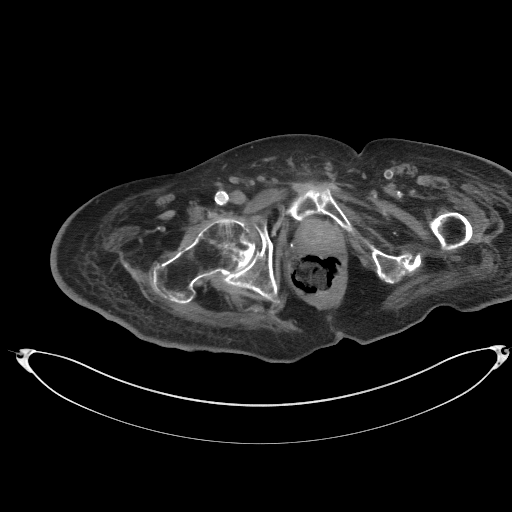
[im 20/96  soft-tissue]
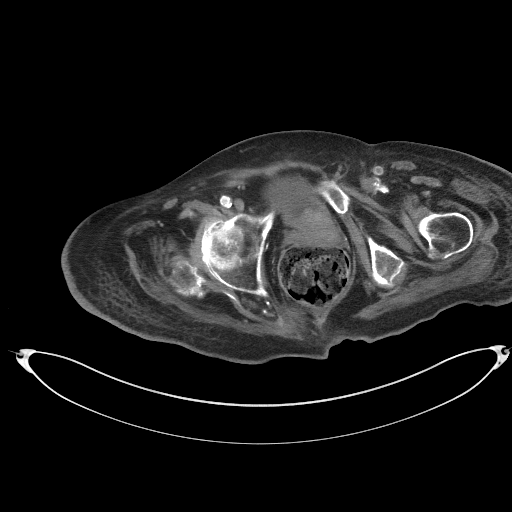
[im 29/96  soft-tissue]
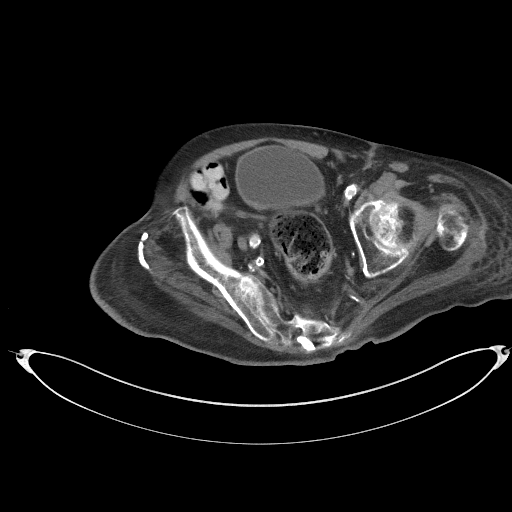
[im 34/96  soft-tissue]
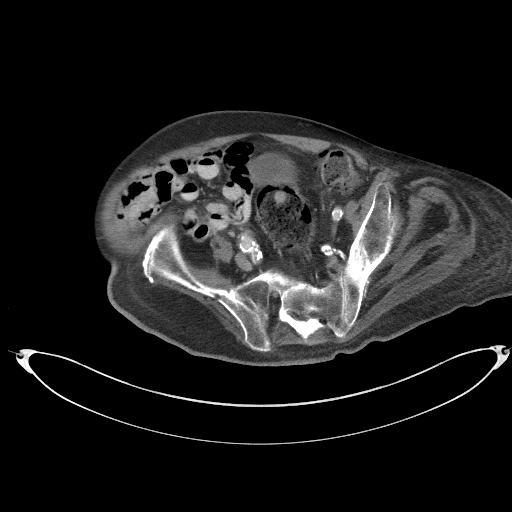
[im 43/96  soft-tissue]
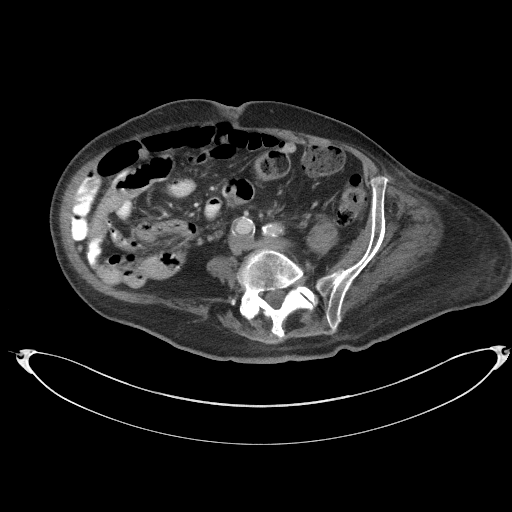
[im 48/96  soft-tissue]
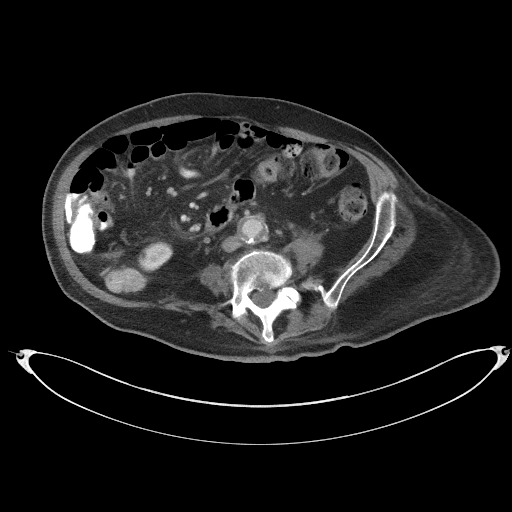
[im 53/96  soft-tissue]
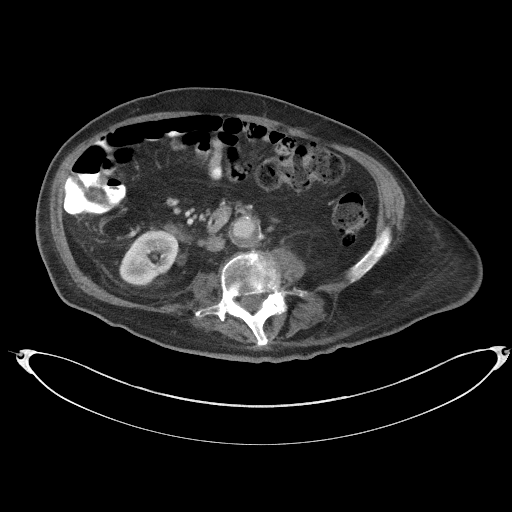
[im 62/96  soft-tissue]
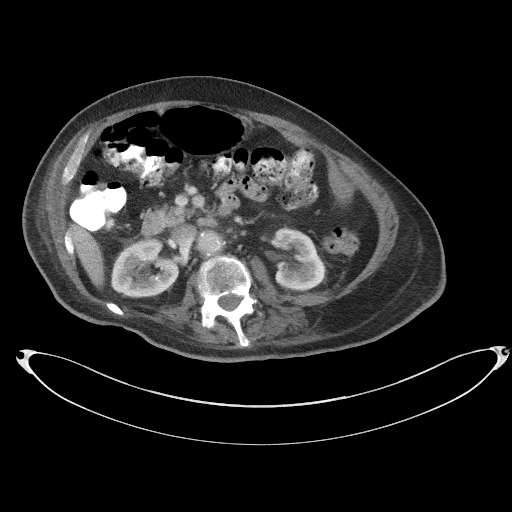
[im 62/96  bone]
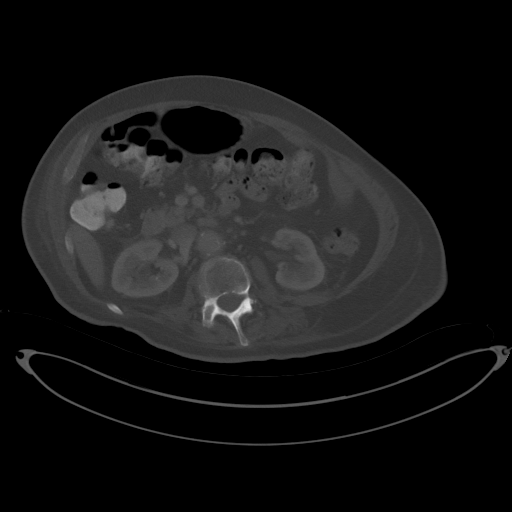
[im 67/96  soft-tissue]
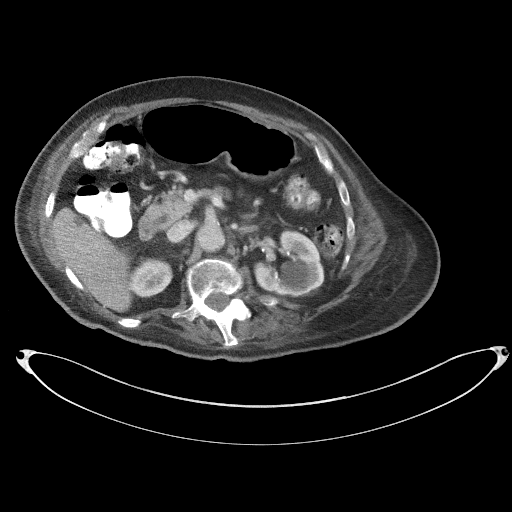
[im 77/96  soft-tissue]
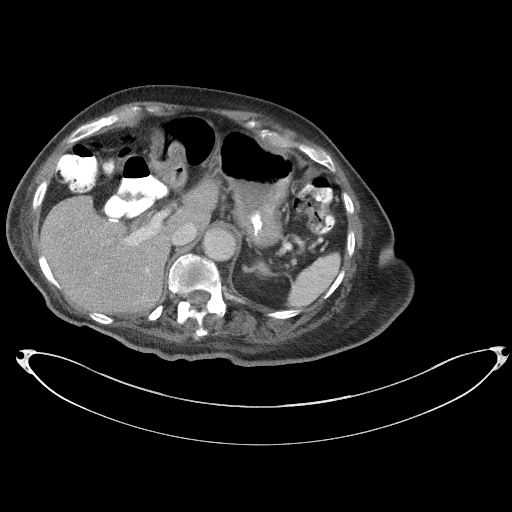
[im 81/96  soft-tissue]
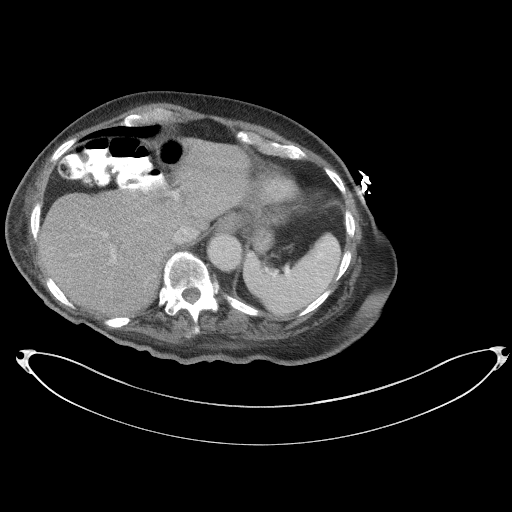
[im 91/96  soft-tissue]
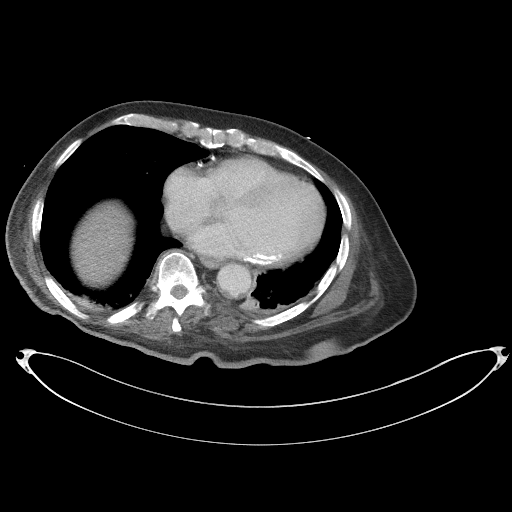

[Series 7: a/p w/ cor · coronal · 0.93mm/px · 3 of 151 slices shown]
[im 51/151  soft-tissue]
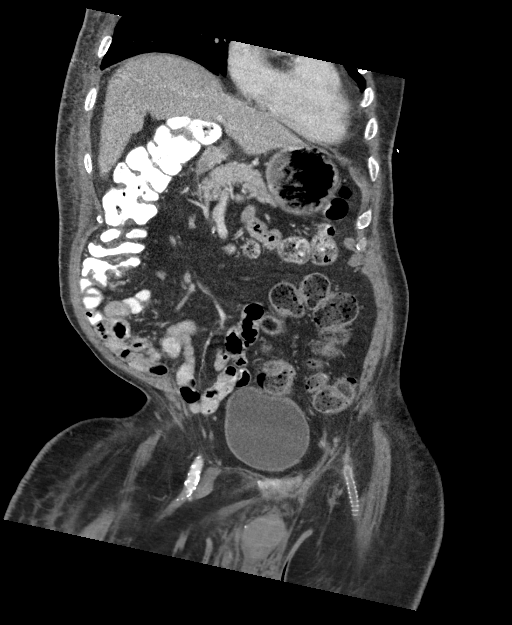
[im 67/151  soft-tissue]
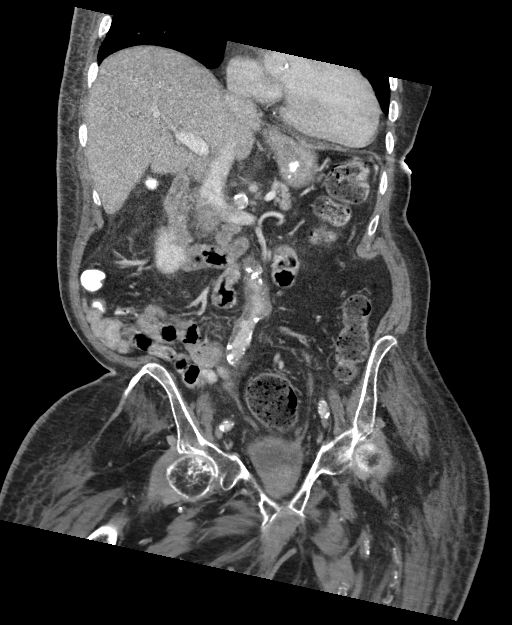
[im 84/151  soft-tissue]
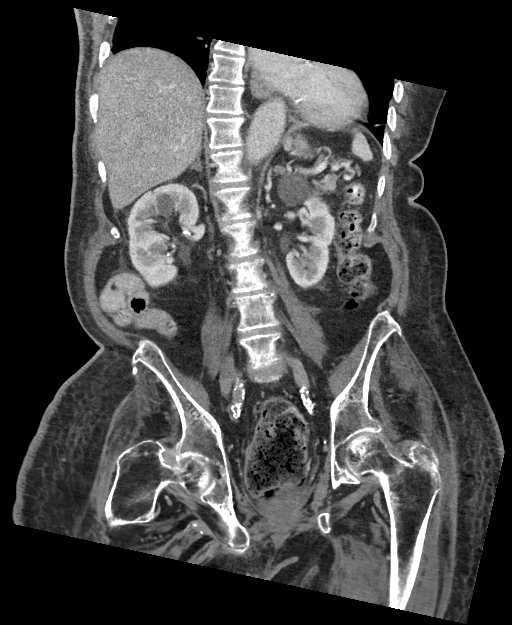

[16 of 46 positions shown; findings below may reference images not displayed]

FINDINGS: Lower chest: Lung bases demonstrate dependent atelectasis in the
left greater than right lung base. Mild cardiomegaly. Coronary
vascular calcification.

Hepatobiliary: No focal liver abnormality is seen. Status post
cholecystectomy. No biliary dilatation.

Pancreas: Unremarkable. No pancreatic ductal dilatation or
surrounding inflammatory changes.

Spleen: Normal in size without focal abnormality.

Adrenals/Urinary Tract: Adrenal glands are within normal limits. No
hydronephrosis. Cysts within the bilateral kidneys. The bladder is
unremarkable

Stomach/Bowel: Stomach is nonenlarged. No dilated small bowel. No
colon wall thickening. Negative appendix. Moderate retained feces in
the rectosigmoid colon.

Vascular/Lymphatic: Tortuous calcified aorta with extensive mural
thrombus. 2.3 cm right common iliac aneurysm. Status post left
femoral bypass graft without definitive flow in the imaged portions
of the proximal graft at the groin. No significantly enlarged lymph
nodes.

Reproductive: Slightly enlarged prostate with calcification

Other: No free air or free fluid. Edema within the subcutaneous soft
tissues.

Musculoskeletal: Bony erosive changes at the sacrococcygeal region.
No overlying fluid collections or significant edema.
IMPRESSION: 1. No definite CT evidence for acute intra-abdominal or pelvic
abnormality. Negative for appendicitis or bowel wall thickening.
Moderate stool in the colon.
2. Tortuous calcified and ectatic aorta with right common iliac
artery aneurysm as before. Status post left femoral bypass without
demonstrable flow in the imaged portions of the proximal left graft,
similar compared to prior study.
3. Bony erosive changes and remodeling of the sacrococcygeal region,
may be due to pressure related changes or sequela of prior
osteomyelitis.
